# Patient Record
Sex: Female | Born: 1955 | Race: Black or African American | Hispanic: No | Marital: Single | Smoking: Former smoker
Health system: Southern US, Community
[De-identification: ages and names within clinical notes are randomized; demographics above are authoritative.]

## PROBLEM LIST (undated history)

## (undated) DIAGNOSIS — M2040 Other hammer toe(s) (acquired), unspecified foot: Secondary | ICD-10-CM

## (undated) DIAGNOSIS — Z8249 Family history of ischemic heart disease and other diseases of the circulatory system: Secondary | ICD-10-CM

## (undated) DIAGNOSIS — I10 Essential (primary) hypertension: Secondary | ICD-10-CM

## (undated) DIAGNOSIS — W57XXXA Bitten or stung by nonvenomous insect and other nonvenomous arthropods, initial encounter: Secondary | ICD-10-CM

## (undated) DIAGNOSIS — W19XXXA Unspecified fall, initial encounter: Secondary | ICD-10-CM

## (undated) DIAGNOSIS — I2510 Atherosclerotic heart disease of native coronary artery without angina pectoris: Secondary | ICD-10-CM

## (undated) DIAGNOSIS — R718 Other abnormality of red blood cells: Secondary | ICD-10-CM

## (undated) DIAGNOSIS — M75102 Unspecified rotator cuff tear or rupture of left shoulder, not specified as traumatic: Secondary | ICD-10-CM

## (undated) DIAGNOSIS — M7520 Bicipital tendinitis, unspecified shoulder: Secondary | ICD-10-CM

## (undated) DIAGNOSIS — I639 Cerebral infarction, unspecified: Secondary | ICD-10-CM

## (undated) DIAGNOSIS — S90569A Insect bite (nonvenomous), unspecified ankle, initial encounter: Secondary | ICD-10-CM

## (undated) DIAGNOSIS — E785 Hyperlipidemia, unspecified: Secondary | ICD-10-CM

## (undated) DIAGNOSIS — M109 Gout, unspecified: Secondary | ICD-10-CM

## (undated) HISTORY — DX: Family history of ischemic heart disease and other diseases of the circulatory system: Z82.49

## (undated) HISTORY — PX: CORONARY ARTERY BYPASS GRAFT: SHX141

## (undated) HISTORY — DX: Essential (primary) hypertension: I10

## (undated) HISTORY — DX: Insect bite (nonvenomous), unspecified ankle, initial encounter: S90.569A

## (undated) HISTORY — DX: Bicipital tendinitis, unspecified shoulder: M75.20

## (undated) HISTORY — DX: Cerebral infarction, unspecified: I63.9

## (undated) HISTORY — PX: CARDIAC CATHETERIZATION: SHX172

## (undated) HISTORY — DX: Hyperlipidemia, unspecified: E78.5

## (undated) HISTORY — DX: Atherosclerotic heart disease of native coronary artery without angina pectoris: I25.10

## (undated) HISTORY — DX: Other abnormality of red blood cells: R71.8

## (undated) HISTORY — PX: TUBAL LIGATION: SHX77

## (undated) HISTORY — DX: Unspecified rotator cuff tear or rupture of left shoulder, not specified as traumatic: M75.102

## (undated) HISTORY — DX: Insect bite (nonvenomous), unspecified ankle, initial encounter: W57.XXXA

## (undated) HISTORY — DX: Other hammer toe(s) (acquired), unspecified foot: M20.40

---

## 2001-11-13 ENCOUNTER — Encounter: Payer: Self-pay | Admitting: Family Medicine

## 2001-11-13 ENCOUNTER — Ambulatory Visit (HOSPITAL_COMMUNITY): Admission: RE | Admit: 2001-11-13 | Discharge: 2001-11-13 | Payer: Self-pay | Admitting: Family Medicine

## 2002-11-18 ENCOUNTER — Encounter: Payer: Self-pay | Admitting: Family Medicine

## 2002-11-18 ENCOUNTER — Ambulatory Visit (HOSPITAL_COMMUNITY): Admission: RE | Admit: 2002-11-18 | Discharge: 2002-11-18 | Payer: Self-pay | Admitting: Family Medicine

## 2003-02-02 ENCOUNTER — Ambulatory Visit (HOSPITAL_COMMUNITY): Admission: RE | Admit: 2003-02-02 | Discharge: 2003-02-02 | Payer: Self-pay | Admitting: Family Medicine

## 2003-02-02 ENCOUNTER — Encounter: Payer: Self-pay | Admitting: Family Medicine

## 2004-09-19 ENCOUNTER — Emergency Department (HOSPITAL_COMMUNITY): Admission: EM | Admit: 2004-09-19 | Discharge: 2004-09-19 | Payer: Self-pay | Admitting: Family Medicine

## 2005-12-15 ENCOUNTER — Emergency Department (HOSPITAL_COMMUNITY): Admission: EM | Admit: 2005-12-15 | Discharge: 2005-12-15 | Payer: Self-pay | Admitting: Family Medicine

## 2005-12-19 ENCOUNTER — Emergency Department (HOSPITAL_COMMUNITY): Admission: EM | Admit: 2005-12-19 | Discharge: 2005-12-19 | Payer: Self-pay | Admitting: Family Medicine

## 2006-08-14 HISTORY — PX: CORONARY ARTERY BYPASS GRAFT: SHX141

## 2007-03-17 ENCOUNTER — Emergency Department (HOSPITAL_COMMUNITY): Admission: EM | Admit: 2007-03-17 | Discharge: 2007-03-17 | Payer: Self-pay | Admitting: Emergency Medicine

## 2007-03-18 ENCOUNTER — Emergency Department (HOSPITAL_COMMUNITY): Admission: EM | Admit: 2007-03-18 | Discharge: 2007-03-18 | Payer: Self-pay | Admitting: Emergency Medicine

## 2007-04-23 DIAGNOSIS — R718 Other abnormality of red blood cells: Secondary | ICD-10-CM | POA: Insufficient documentation

## 2007-04-23 DIAGNOSIS — I10 Essential (primary) hypertension: Secondary | ICD-10-CM

## 2007-04-24 ENCOUNTER — Ambulatory Visit: Payer: Self-pay | Admitting: Family Medicine

## 2007-04-24 ENCOUNTER — Ambulatory Visit: Payer: Self-pay | Admitting: *Deleted

## 2007-04-24 DIAGNOSIS — M7520 Bicipital tendinitis, unspecified shoulder: Secondary | ICD-10-CM | POA: Insufficient documentation

## 2007-04-24 LAB — CONVERTED CEMR LAB
ALT: 39 units/L — ABNORMAL HIGH (ref 0–35)
AST: 25 units/L (ref 0–37)
Albumin: 4.6 g/dL (ref 3.5–5.2)
Alkaline Phosphatase: 79 units/L (ref 39–117)
BUN: 11 mg/dL (ref 6–23)
Basophils Absolute: 0 10*3/uL (ref 0.0–0.1)
Basophils Relative: 0 % (ref 0–1)
Calcium: 9.9 mg/dL (ref 8.4–10.5)
Chloride: 105 meq/L (ref 96–112)
Creatinine, Ser: 1.06 mg/dL (ref 0.40–1.20)
Eosinophils Absolute: 0.1 10*3/uL (ref 0.0–0.7)
HDL: 59 mg/dL (ref >39)
LDL Cholesterol: 107 mg/dL — ABNORMAL HIGH (ref 0–99)
MCHC: 31.9 g/dL (ref 30.0–36.0)
MCV: 71.5 fL — ABNORMAL LOW (ref 78.0–100.0)
Neutro Abs: 2.9 10*3/uL (ref 1.7–7.7)
Neutrophils Relative %: 41 % — ABNORMAL LOW (ref 43–77)
Potassium: 5 meq/L (ref 3.5–5.3)
RDW: 15.7 % — ABNORMAL HIGH (ref 11.5–14.0)
TSH: 0.482 microintl units/mL (ref 0.350–5.50)
Total CHOL/HDL Ratio: 3.2

## 2007-04-25 ENCOUNTER — Ambulatory Visit (HOSPITAL_COMMUNITY): Admission: RE | Admit: 2007-04-25 | Discharge: 2007-04-25 | Payer: Self-pay | Admitting: Family Medicine

## 2007-04-25 ENCOUNTER — Encounter (INDEPENDENT_AMBULATORY_CARE_PROVIDER_SITE_OTHER): Payer: Self-pay | Admitting: Family Medicine

## 2007-05-15 DIAGNOSIS — I2510 Atherosclerotic heart disease of native coronary artery without angina pectoris: Secondary | ICD-10-CM

## 2007-05-15 HISTORY — DX: Atherosclerotic heart disease of native coronary artery without angina pectoris: I25.10

## 2007-05-29 ENCOUNTER — Inpatient Hospital Stay (HOSPITAL_COMMUNITY): Admission: EM | Admit: 2007-05-29 | Discharge: 2007-06-10 | Payer: Self-pay | Admitting: Emergency Medicine

## 2007-05-29 ENCOUNTER — Ambulatory Visit: Payer: Self-pay | Admitting: Cardiology

## 2007-05-30 ENCOUNTER — Encounter (INDEPENDENT_AMBULATORY_CARE_PROVIDER_SITE_OTHER): Payer: Self-pay | Admitting: Family Medicine

## 2007-06-02 ENCOUNTER — Ambulatory Visit: Payer: Self-pay | Admitting: Thoracic Surgery (Cardiothoracic Vascular Surgery)

## 2007-06-02 ENCOUNTER — Encounter (INDEPENDENT_AMBULATORY_CARE_PROVIDER_SITE_OTHER): Payer: Self-pay | Admitting: Family Medicine

## 2007-06-25 ENCOUNTER — Ambulatory Visit: Payer: Self-pay | Admitting: Internal Medicine

## 2007-06-28 ENCOUNTER — Encounter: Admission: RE | Admit: 2007-06-28 | Discharge: 2007-06-28 | Payer: Self-pay | Admitting: Cardiothoracic Surgery

## 2007-06-28 ENCOUNTER — Ambulatory Visit: Payer: Self-pay | Admitting: Cardiothoracic Surgery

## 2007-07-04 ENCOUNTER — Encounter (INDEPENDENT_AMBULATORY_CARE_PROVIDER_SITE_OTHER): Payer: Self-pay | Admitting: Family Medicine

## 2007-07-04 ENCOUNTER — Ambulatory Visit: Payer: Self-pay | Admitting: Vascular Surgery

## 2007-07-04 ENCOUNTER — Ambulatory Visit (HOSPITAL_COMMUNITY): Admission: RE | Admit: 2007-07-04 | Discharge: 2007-07-04 | Payer: Self-pay | Admitting: Cardiothoracic Surgery

## 2007-07-04 ENCOUNTER — Encounter: Payer: Self-pay | Admitting: Cardiothoracic Surgery

## 2007-07-05 ENCOUNTER — Ambulatory Visit: Payer: Self-pay | Admitting: Cardiothoracic Surgery

## 2007-07-19 ENCOUNTER — Ambulatory Visit: Payer: Self-pay | Admitting: Cardiothoracic Surgery

## 2007-08-13 ENCOUNTER — Encounter (INDEPENDENT_AMBULATORY_CARE_PROVIDER_SITE_OTHER): Payer: Self-pay | Admitting: Family Medicine

## 2007-09-05 ENCOUNTER — Ambulatory Visit: Payer: Self-pay | Admitting: Cardiothoracic Surgery

## 2007-09-16 ENCOUNTER — Encounter (INDEPENDENT_AMBULATORY_CARE_PROVIDER_SITE_OTHER): Payer: Self-pay | Admitting: Family Medicine

## 2007-09-16 ENCOUNTER — Ambulatory Visit: Payer: Self-pay | Admitting: Cardiology

## 2007-09-19 ENCOUNTER — Encounter (HOSPITAL_COMMUNITY): Admission: RE | Admit: 2007-09-19 | Discharge: 2007-12-18 | Payer: Self-pay | Admitting: Cardiology

## 2007-09-25 ENCOUNTER — Ambulatory Visit: Payer: Self-pay | Admitting: Cardiology

## 2007-10-10 ENCOUNTER — Ambulatory Visit: Payer: Self-pay | Admitting: Cardiology

## 2007-10-10 LAB — CONVERTED CEMR LAB
BUN: 12 mg/dL (ref 6–23)
Creatinine, Ser: 1.1 mg/dL (ref 0.4–1.2)
GFR calc non Af Amer: 56 mL/min
Potassium: 3.8 meq/L (ref 3.5–5.1)
Sodium: 141 meq/L (ref 135–145)

## 2007-11-05 ENCOUNTER — Ambulatory Visit: Payer: Self-pay | Admitting: Family Medicine

## 2007-11-05 DIAGNOSIS — K089 Disorder of teeth and supporting structures, unspecified: Secondary | ICD-10-CM | POA: Insufficient documentation

## 2007-11-05 DIAGNOSIS — E785 Hyperlipidemia, unspecified: Secondary | ICD-10-CM | POA: Insufficient documentation

## 2007-11-28 ENCOUNTER — Ambulatory Visit: Payer: Self-pay | Admitting: Cardiology

## 2007-12-05 ENCOUNTER — Encounter (INDEPENDENT_AMBULATORY_CARE_PROVIDER_SITE_OTHER): Payer: Self-pay | Admitting: Neurology

## 2007-12-05 ENCOUNTER — Encounter (INDEPENDENT_AMBULATORY_CARE_PROVIDER_SITE_OTHER): Payer: Self-pay | Admitting: Family Medicine

## 2007-12-05 ENCOUNTER — Inpatient Hospital Stay (HOSPITAL_COMMUNITY): Admission: EM | Admit: 2007-12-05 | Discharge: 2007-12-08 | Payer: Self-pay | Admitting: Emergency Medicine

## 2007-12-05 ENCOUNTER — Ambulatory Visit: Payer: Self-pay | Admitting: Cardiology

## 2007-12-05 DIAGNOSIS — I6350 Cerebral infarction due to unspecified occlusion or stenosis of unspecified cerebral artery: Secondary | ICD-10-CM

## 2007-12-05 HISTORY — DX: Cerebral infarction due to unspecified occlusion or stenosis of unspecified cerebral artery: I63.50

## 2007-12-06 ENCOUNTER — Encounter (INDEPENDENT_AMBULATORY_CARE_PROVIDER_SITE_OTHER): Payer: Self-pay | Admitting: Neurology

## 2008-01-27 ENCOUNTER — Encounter (INDEPENDENT_AMBULATORY_CARE_PROVIDER_SITE_OTHER): Payer: Self-pay | Admitting: Family Medicine

## 2008-01-28 ENCOUNTER — Encounter (INDEPENDENT_AMBULATORY_CARE_PROVIDER_SITE_OTHER): Payer: Self-pay | Admitting: Family Medicine

## 2008-02-07 ENCOUNTER — Ambulatory Visit: Payer: Self-pay | Admitting: Cardiology

## 2008-02-07 LAB — CONVERTED CEMR LAB
ALT: 75 units/L — ABNORMAL HIGH (ref 0–35)
AST: 61 units/L — ABNORMAL HIGH (ref 0–37)
Albumin: 3.8 g/dL (ref 3.5–5.2)
Alkaline Phosphatase: 65 units/L (ref 39–117)
Bilirubin, Direct: 0.1 mg/dL (ref 0.0–0.3)
Cholesterol: 116 mg/dL (ref 0–200)
Total Protein: 6.5 g/dL (ref 6.0–8.3)
Triglycerides: 45 mg/dL (ref 0–149)

## 2008-02-11 ENCOUNTER — Ambulatory Visit: Payer: Self-pay | Admitting: Family Medicine

## 2008-02-11 DIAGNOSIS — W57XXXA Bitten or stung by nonvenomous insect and other nonvenomous arthropods, initial encounter: Secondary | ICD-10-CM | POA: Insufficient documentation

## 2008-02-11 DIAGNOSIS — M2040 Other hammer toe(s) (acquired), unspecified foot: Secondary | ICD-10-CM

## 2008-02-11 DIAGNOSIS — S90569A Insect bite (nonvenomous), unspecified ankle, initial encounter: Secondary | ICD-10-CM

## 2008-02-12 ENCOUNTER — Telehealth (INDEPENDENT_AMBULATORY_CARE_PROVIDER_SITE_OTHER): Payer: Self-pay | Admitting: *Deleted

## 2008-02-19 ENCOUNTER — Ambulatory Visit: Payer: Self-pay | Admitting: Family Medicine

## 2008-02-28 ENCOUNTER — Telehealth (INDEPENDENT_AMBULATORY_CARE_PROVIDER_SITE_OTHER): Payer: Self-pay | Admitting: *Deleted

## 2008-03-10 ENCOUNTER — Encounter (INDEPENDENT_AMBULATORY_CARE_PROVIDER_SITE_OTHER): Payer: Self-pay | Admitting: Family Medicine

## 2008-03-26 ENCOUNTER — Telehealth (INDEPENDENT_AMBULATORY_CARE_PROVIDER_SITE_OTHER): Payer: Self-pay | Admitting: Family Medicine

## 2008-04-10 ENCOUNTER — Emergency Department (HOSPITAL_COMMUNITY): Admission: EM | Admit: 2008-04-10 | Discharge: 2008-04-10 | Payer: Self-pay | Admitting: Family Medicine

## 2008-04-28 ENCOUNTER — Ambulatory Visit (HOSPITAL_COMMUNITY): Admission: RE | Admit: 2008-04-28 | Discharge: 2008-04-28 | Payer: Self-pay | Admitting: Family Medicine

## 2008-05-29 ENCOUNTER — Ambulatory Visit: Payer: Self-pay | Admitting: Cardiology

## 2008-09-07 ENCOUNTER — Encounter (INDEPENDENT_AMBULATORY_CARE_PROVIDER_SITE_OTHER): Payer: Self-pay | Admitting: Family Medicine

## 2008-10-25 IMAGING — CR DG ABDOMEN ACUTE W/ 1V CHEST
3 series · 3 of 3 positions shown · non-contrast
Comparison: none

HISTORY: The dural female with low abdominal pain x4 days frequent urination,
and vomiting or diarrhea.

Exam acute abdominal series with chest x-ray:
Chest x-ray findings:
The cardiac silhouette is normal. There is prominence of the ascending aorta and
ectatic descending aorta. Costophrenic angles are clear. No free air beneath the
hemidiaphragms. Normal pulmonary vasculature.

[view not recorded (1 of 3)]
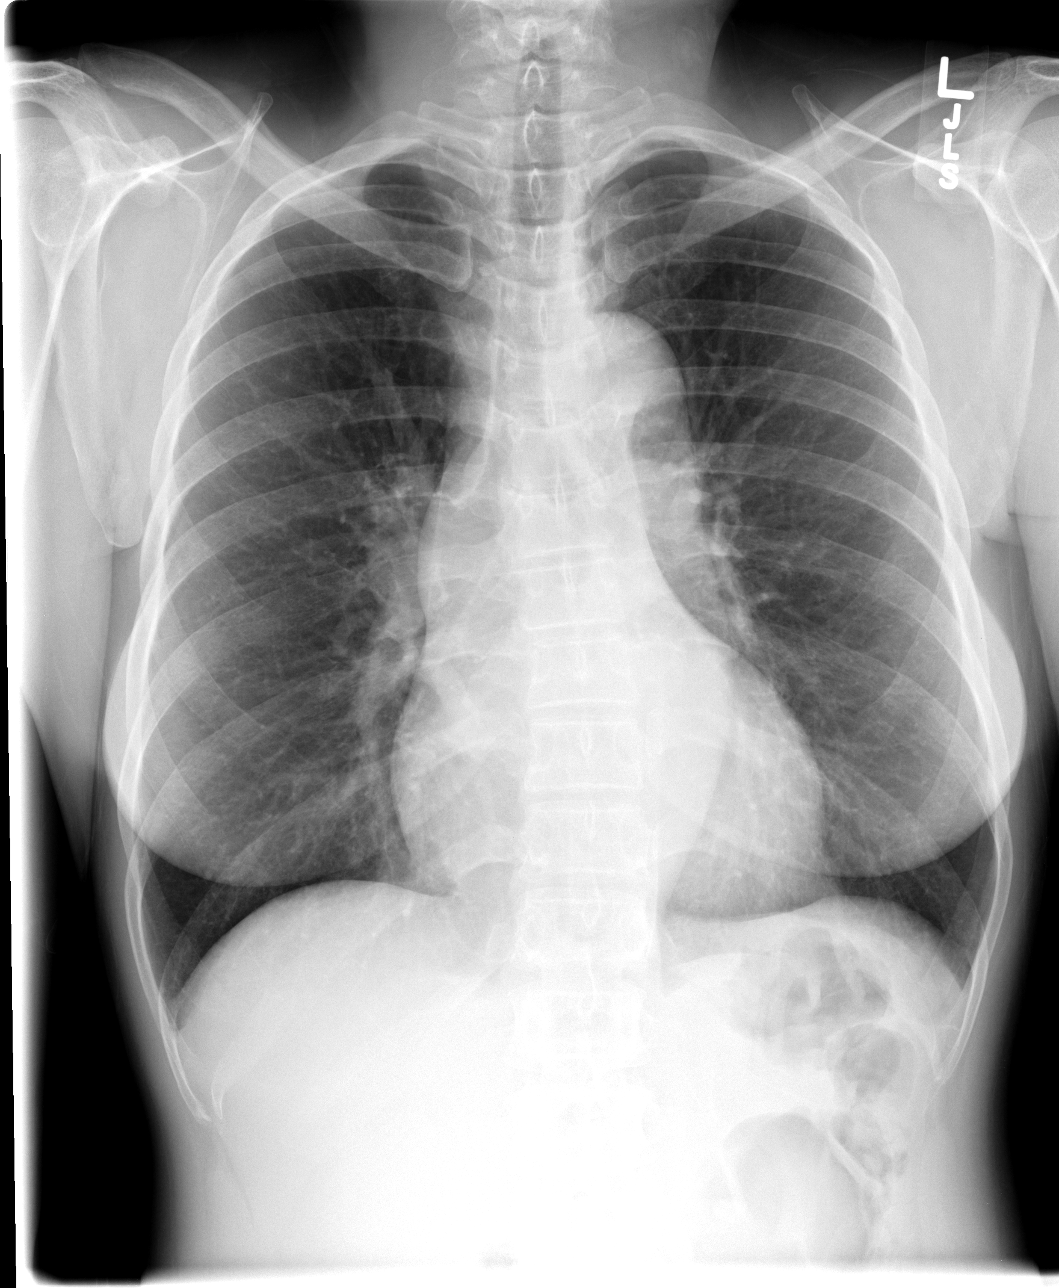

[view not recorded (2 of 3)]
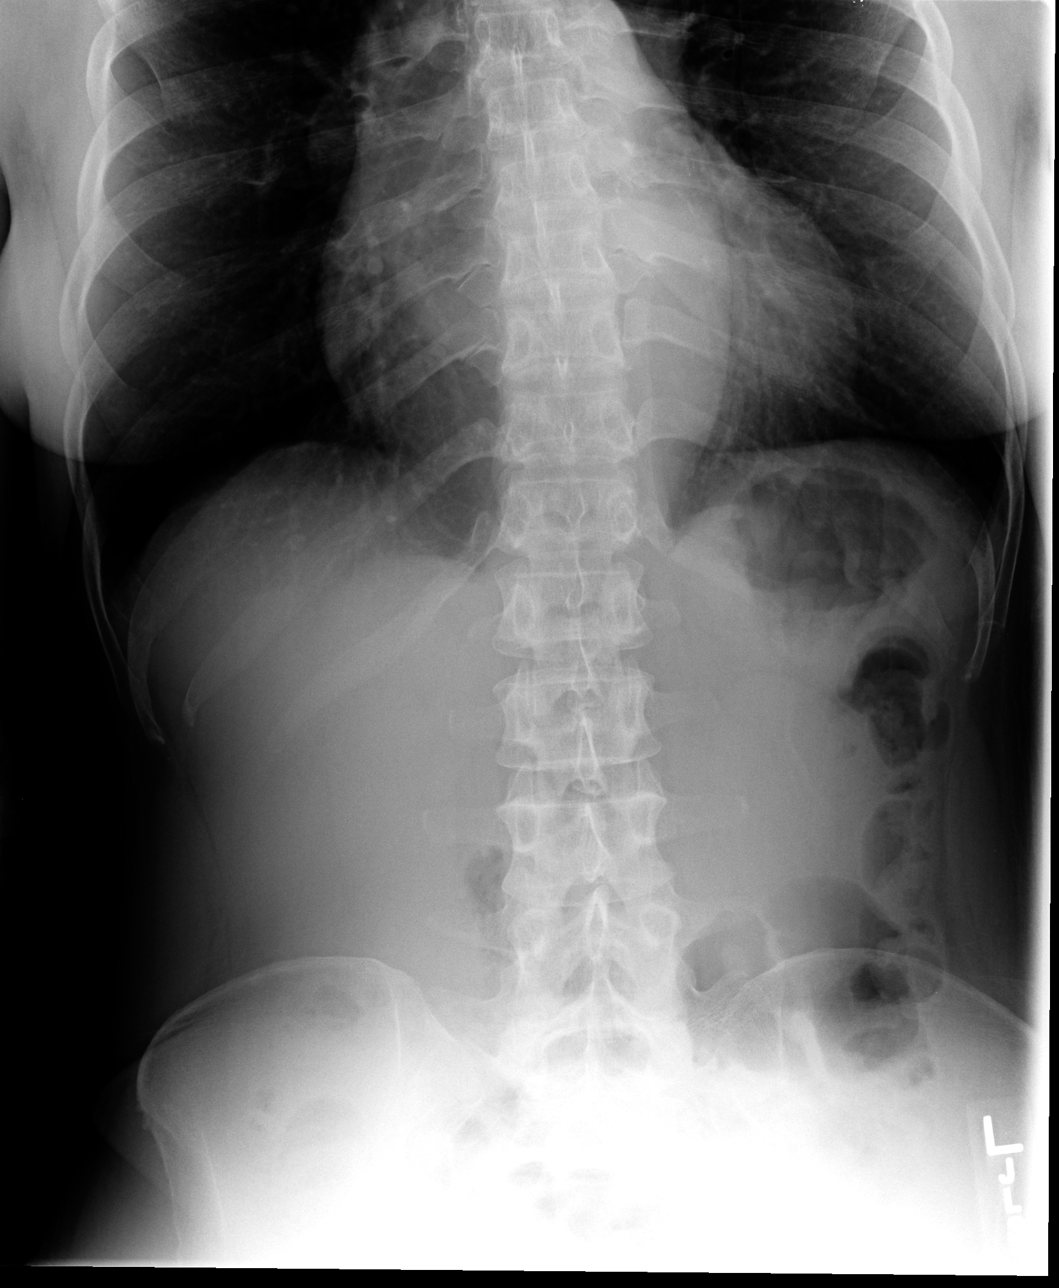

[view not recorded (3 of 3)]
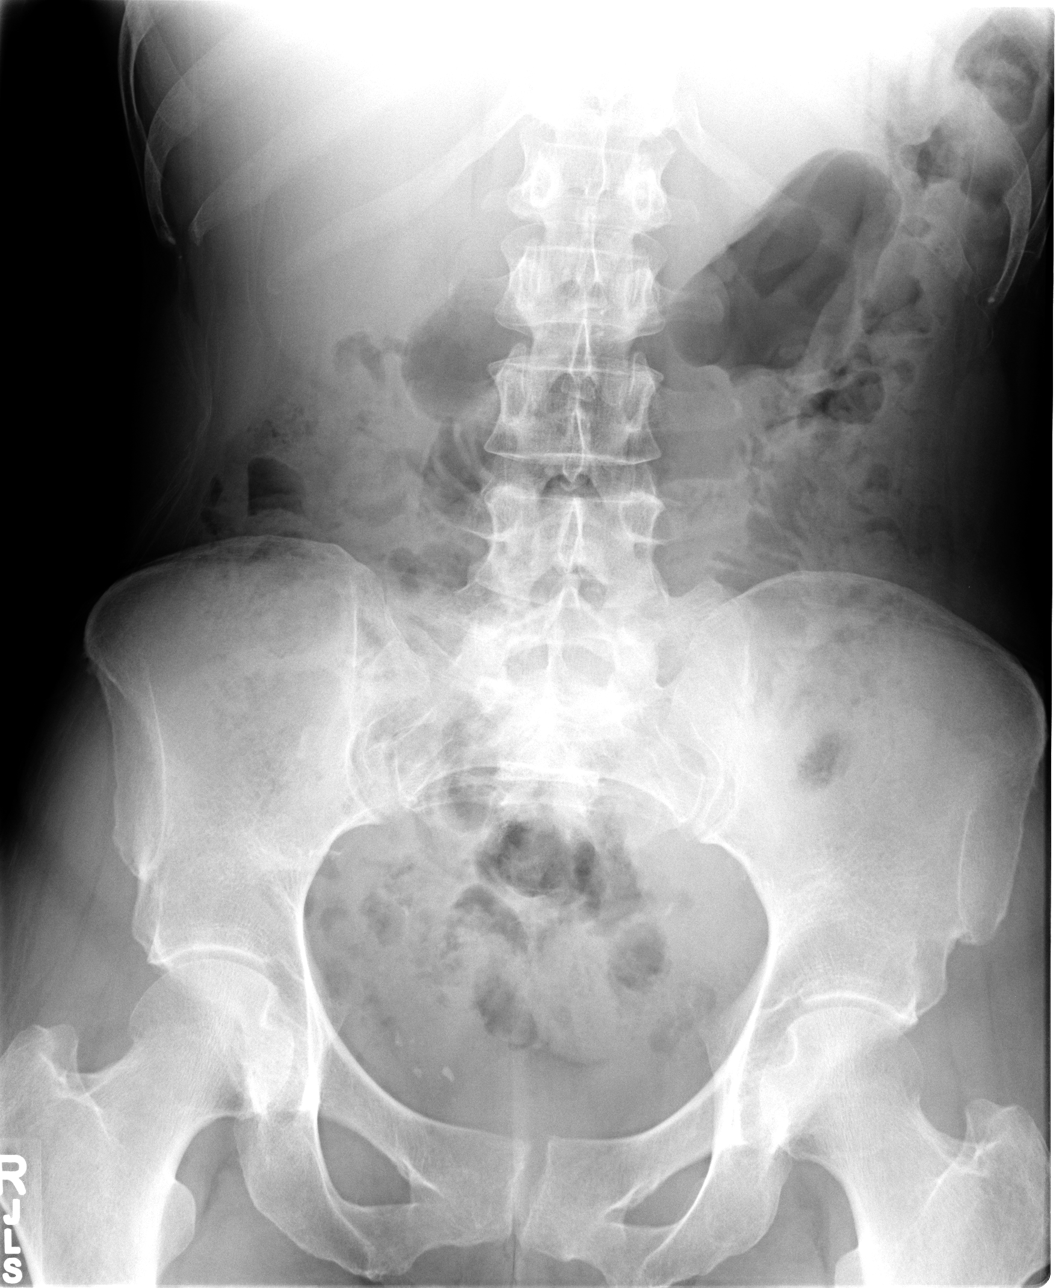

[3 of 3 positions shown; findings below may reference images not displayed]

IMPRESSION: 1. No acute cardiopulmonary process.
2. No free air beneath the hemidiaphragms.
3. Prominent ascending aorta may represents ectasia versus aortic stenosis,
correlate clinically.

Abdomen 3 views:
There are several mildly dilated loops (3.5 cm) of apparent small bowel within
the midlower abdomen. There is gas within the transverse, descending, and
rectosigmoid colon. No pathologic calcifications are evident.
IMPRESSION: 1. Several mildly dilated loops of apparent small bowel in the midlower abdomen
is nonspecific finding may represent focal ileus. Cannot rule out partial
obstruction.
2. No evidence of high-grade obstruction as gas is evident within the distal
colon.

## 2008-10-26 IMAGING — CR DG ABDOMEN ACUTE W/ 1V CHEST
3 series · 3 of 3 positions shown · non-contrast
Comparison: 03/17/07.

CLINICAL DATA: Abdominal pain.  Nausea.  
 ACUTE ABDOMINAL SERIES:

[w chest pa]
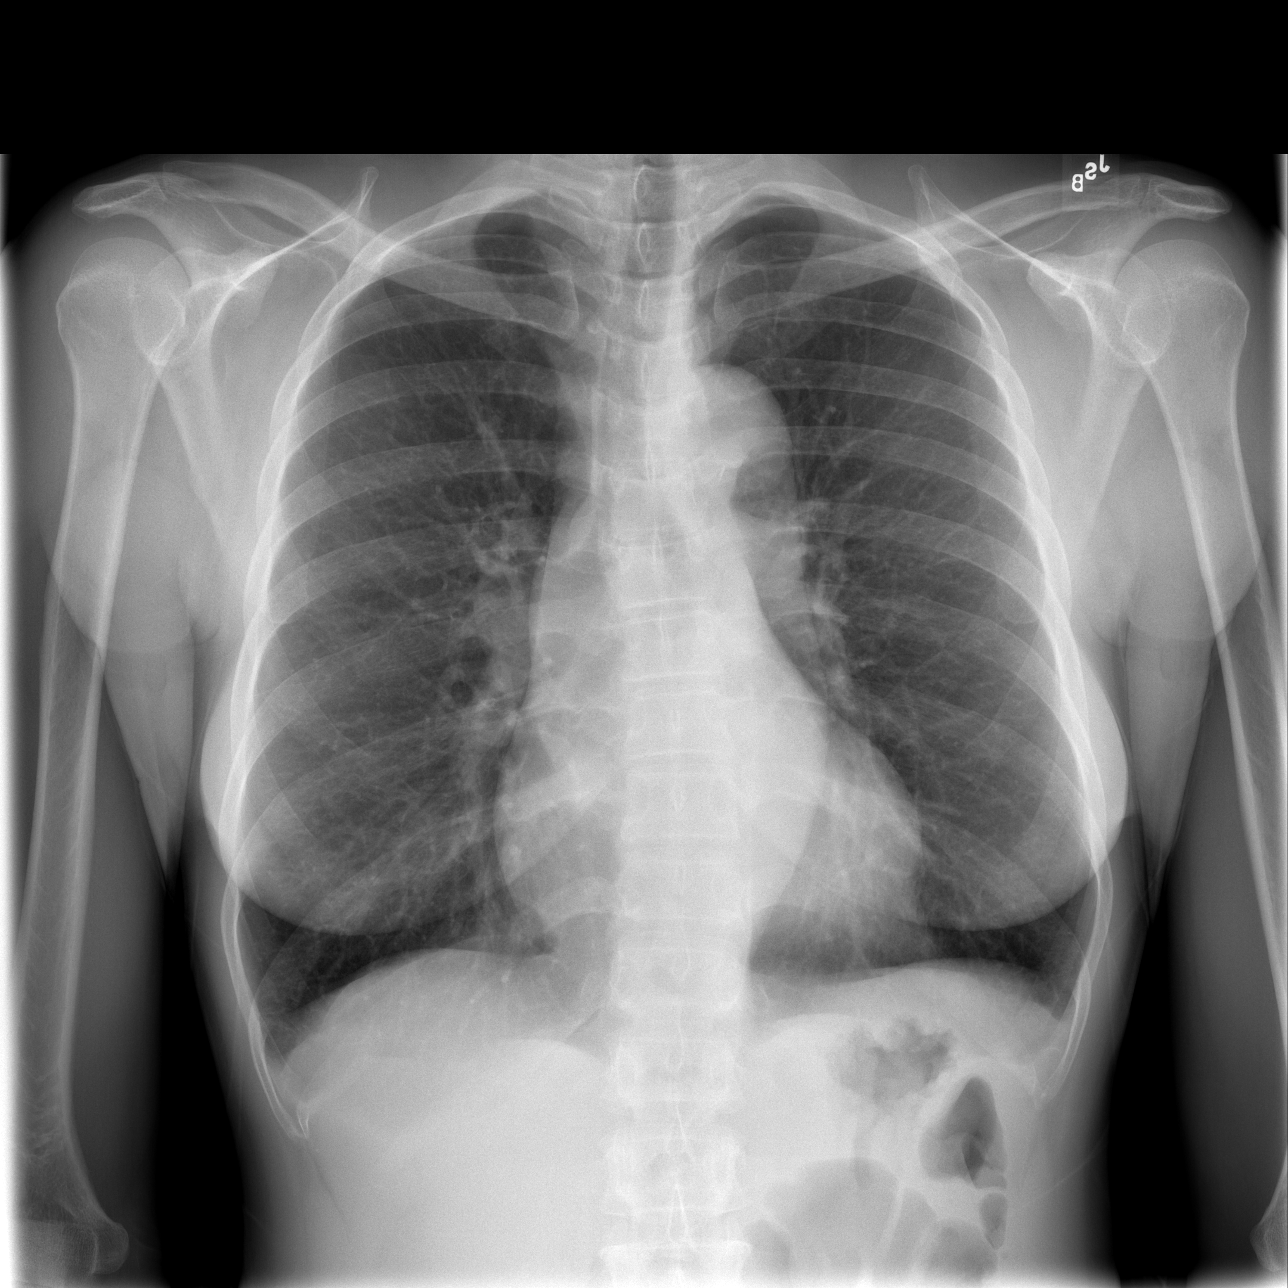

[w abdomen upright]
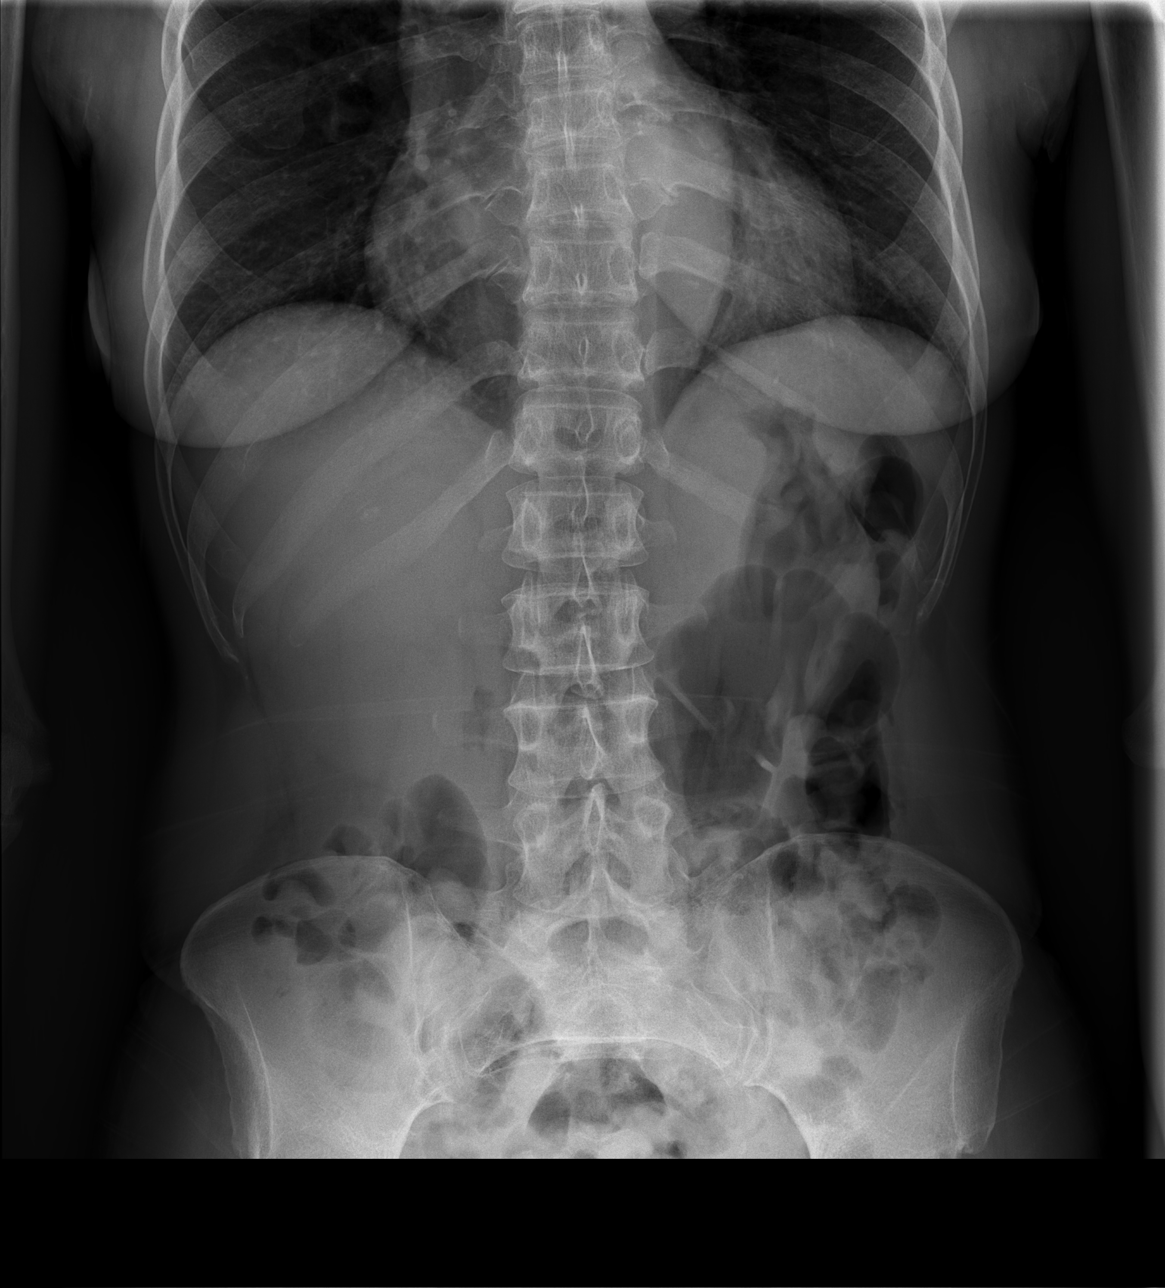

[t abdomen supine]
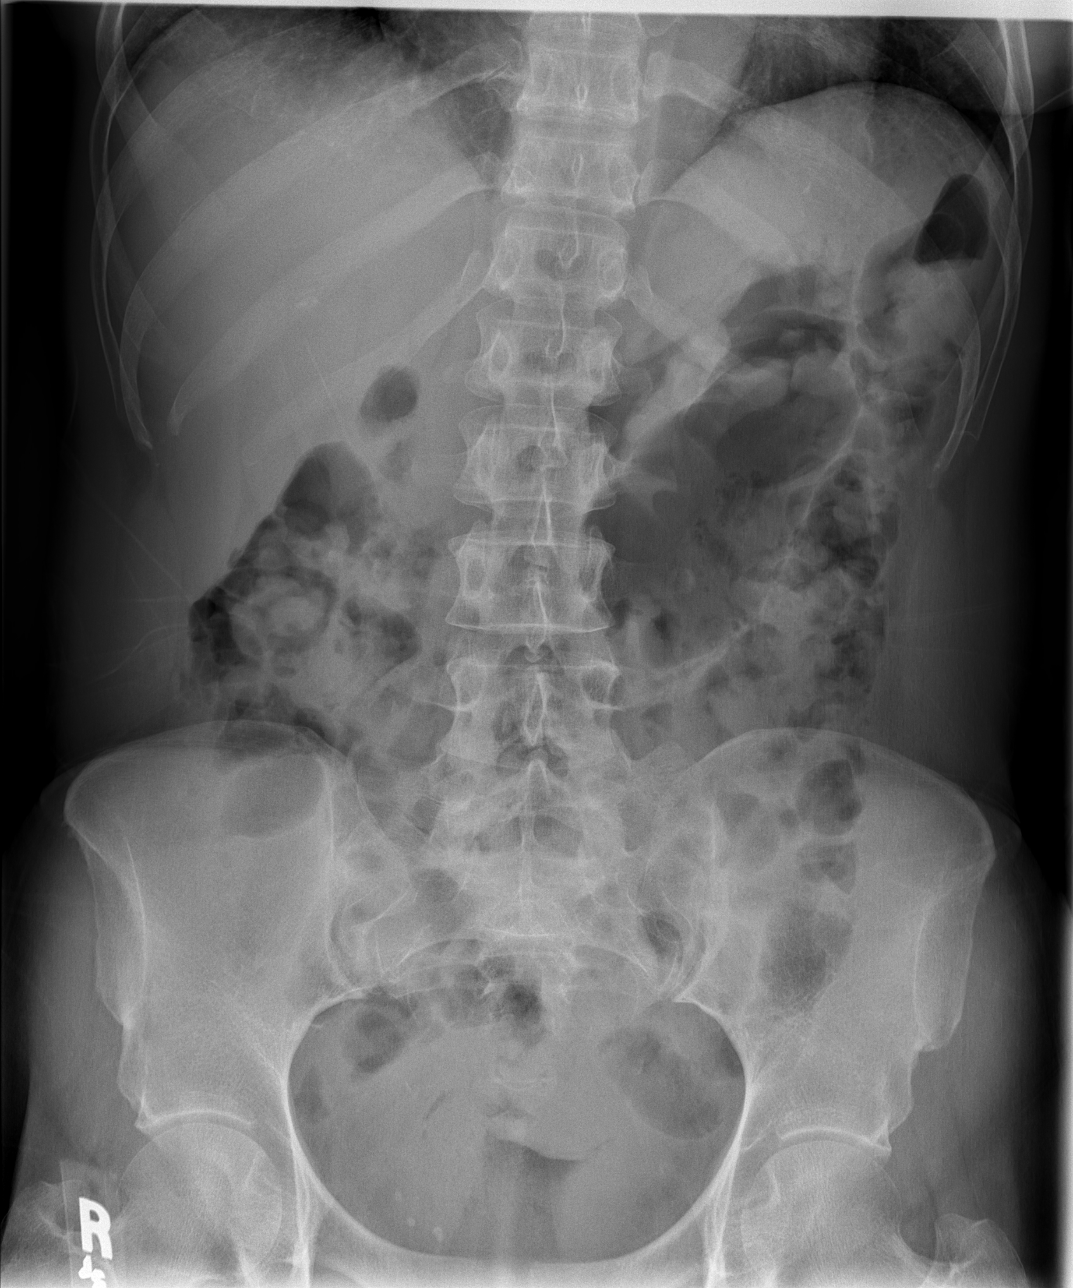

[3 of 3 positions shown; findings below may reference images not displayed]

FINDINGS: Abdomen ? 2 Views:  Gas pattern is unremarkable without evidence of ileus, obstruction, or free air.  No worrisome calcifications or bony findings. 
 Chest ? 1 View:  Heart size is normal.  The thoracic aorta is unfolded.  The lungs are clear.  No free air under the diaphragm.
IMPRESSION: Negative acute abdominal series.

## 2008-11-03 ENCOUNTER — Ambulatory Visit: Payer: Self-pay | Admitting: Family Medicine

## 2008-11-03 DIAGNOSIS — M25512 Pain in left shoulder: Secondary | ICD-10-CM

## 2008-11-04 ENCOUNTER — Ambulatory Visit (HOSPITAL_COMMUNITY): Admission: RE | Admit: 2008-11-04 | Discharge: 2008-11-04 | Payer: Self-pay | Admitting: Family Medicine

## 2008-11-04 ENCOUNTER — Telehealth (INDEPENDENT_AMBULATORY_CARE_PROVIDER_SITE_OTHER): Payer: Self-pay | Admitting: Family Medicine

## 2008-11-24 ENCOUNTER — Ambulatory Visit: Payer: Self-pay | Admitting: Family Medicine

## 2008-11-25 ENCOUNTER — Encounter (INDEPENDENT_AMBULATORY_CARE_PROVIDER_SITE_OTHER): Payer: Self-pay | Admitting: Family Medicine

## 2008-11-25 LAB — CONVERTED CEMR LAB
AST: 31 units/L (ref 0–37)
Alkaline Phosphatase: 75 units/L (ref 39–117)
Basophils Absolute: 0 10*3/uL (ref 0.0–0.1)
Basophils Relative: 0 % (ref 0–1)
Glucose, Bld: 87 mg/dL (ref 70–99)
HDL: 51 mg/dL (ref >39)
LDL Cholesterol: 59 mg/dL (ref 0–99)
Lymphocytes Relative: 43 % (ref 12–46)
MCHC: 32.5 g/dL (ref 30.0–36.0)
Monocytes Absolute: 0.5 10*3/uL (ref 0.1–1.0)
Neutro Abs: 3.5 10*3/uL (ref 1.7–7.7)
Neutrophils Relative %: 49 % (ref 43–77)
Platelets: 241 10*3/uL (ref 150–400)
Potassium: 4.2 meq/L (ref 3.5–5.3)
RDW: 16 % — ABNORMAL HIGH (ref 11.5–15.5)
Sodium: 141 meq/L (ref 135–145)
TSH: 1.466 microintl units/mL (ref 0.350–4.500)
Total Bilirubin: 0.3 mg/dL (ref 0.3–1.2)
Total Protein: 6.6 g/dL (ref 6.0–8.3)
Triglycerides: 131 mg/dL (ref <150)
VLDL: 26 mg/dL (ref 0–40)

## 2008-12-23 ENCOUNTER — Telehealth: Payer: Self-pay | Admitting: Cardiology

## 2009-01-04 ENCOUNTER — Ambulatory Visit (HOSPITAL_COMMUNITY): Admission: RE | Admit: 2009-01-04 | Discharge: 2009-01-04 | Payer: Self-pay | Admitting: Family Medicine

## 2009-01-06 ENCOUNTER — Telehealth (INDEPENDENT_AMBULATORY_CARE_PROVIDER_SITE_OTHER): Payer: Self-pay | Admitting: Family Medicine

## 2009-01-22 ENCOUNTER — Encounter (INDEPENDENT_AMBULATORY_CARE_PROVIDER_SITE_OTHER): Payer: Self-pay | Admitting: *Deleted

## 2009-01-27 ENCOUNTER — Telehealth (INDEPENDENT_AMBULATORY_CARE_PROVIDER_SITE_OTHER): Payer: Self-pay | Admitting: Family Medicine

## 2009-01-29 ENCOUNTER — Encounter (INDEPENDENT_AMBULATORY_CARE_PROVIDER_SITE_OTHER): Payer: Self-pay | Admitting: Nurse Practitioner

## 2009-02-04 ENCOUNTER — Encounter (INDEPENDENT_AMBULATORY_CARE_PROVIDER_SITE_OTHER): Payer: Self-pay | Admitting: *Deleted

## 2009-02-12 ENCOUNTER — Ambulatory Visit: Payer: Self-pay | Admitting: Nurse Practitioner

## 2009-02-12 DIAGNOSIS — M719 Bursopathy, unspecified: Secondary | ICD-10-CM

## 2009-02-12 DIAGNOSIS — M67919 Unspecified disorder of synovium and tendon, unspecified shoulder: Secondary | ICD-10-CM | POA: Insufficient documentation

## 2009-02-12 LAB — CONVERTED CEMR LAB: LDL Goal: 100 mg/dL

## 2009-02-23 ENCOUNTER — Emergency Department (HOSPITAL_COMMUNITY): Admission: EM | Admit: 2009-02-23 | Discharge: 2009-02-23 | Payer: Self-pay | Admitting: Emergency Medicine

## 2009-02-23 ENCOUNTER — Emergency Department (HOSPITAL_COMMUNITY): Admission: EM | Admit: 2009-02-23 | Discharge: 2009-02-24 | Payer: Self-pay | Admitting: Emergency Medicine

## 2009-02-26 ENCOUNTER — Encounter (INDEPENDENT_AMBULATORY_CARE_PROVIDER_SITE_OTHER): Payer: Self-pay | Admitting: Nurse Practitioner

## 2009-03-02 ENCOUNTER — Encounter (INDEPENDENT_AMBULATORY_CARE_PROVIDER_SITE_OTHER): Payer: Self-pay | Admitting: Internal Medicine

## 2009-03-04 ENCOUNTER — Encounter (INDEPENDENT_AMBULATORY_CARE_PROVIDER_SITE_OTHER): Payer: Self-pay | Admitting: Nurse Practitioner

## 2009-03-17 ENCOUNTER — Ambulatory Visit: Payer: Self-pay | Admitting: Internal Medicine

## 2009-03-17 ENCOUNTER — Encounter (INDEPENDENT_AMBULATORY_CARE_PROVIDER_SITE_OTHER): Payer: Self-pay | Admitting: Nurse Practitioner

## 2009-04-07 ENCOUNTER — Encounter (INDEPENDENT_AMBULATORY_CARE_PROVIDER_SITE_OTHER): Payer: Self-pay | Admitting: Family Medicine

## 2009-04-07 ENCOUNTER — Encounter (INDEPENDENT_AMBULATORY_CARE_PROVIDER_SITE_OTHER): Payer: Self-pay | Admitting: Nurse Practitioner

## 2009-05-03 DIAGNOSIS — Z87891 Personal history of nicotine dependence: Secondary | ICD-10-CM

## 2009-05-03 DIAGNOSIS — I25810 Atherosclerosis of coronary artery bypass graft(s) without angina pectoris: Secondary | ICD-10-CM

## 2009-05-03 DIAGNOSIS — I25708 Atherosclerosis of coronary artery bypass graft(s), unspecified, with other forms of angina pectoris: Secondary | ICD-10-CM | POA: Insufficient documentation

## 2009-05-11 ENCOUNTER — Ambulatory Visit: Payer: Self-pay | Admitting: Cardiology

## 2009-05-12 ENCOUNTER — Ambulatory Visit (HOSPITAL_COMMUNITY): Admission: RE | Admit: 2009-05-12 | Discharge: 2009-05-12 | Payer: Self-pay | Admitting: Internal Medicine

## 2009-05-12 ENCOUNTER — Telehealth (INDEPENDENT_AMBULATORY_CARE_PROVIDER_SITE_OTHER): Payer: Self-pay | Admitting: Nurse Practitioner

## 2009-05-13 ENCOUNTER — Telehealth: Payer: Self-pay | Admitting: Cardiology

## 2009-05-14 ENCOUNTER — Telehealth: Payer: Self-pay | Admitting: Cardiology

## 2009-05-25 ENCOUNTER — Telehealth (INDEPENDENT_AMBULATORY_CARE_PROVIDER_SITE_OTHER): Payer: Self-pay | Admitting: *Deleted

## 2009-05-27 ENCOUNTER — Telehealth: Payer: Self-pay | Admitting: Cardiology

## 2009-06-04 ENCOUNTER — Telehealth: Payer: Self-pay | Admitting: Cardiology

## 2009-06-14 ENCOUNTER — Telehealth: Payer: Self-pay | Admitting: Cardiology

## 2009-06-28 ENCOUNTER — Telehealth: Payer: Self-pay | Admitting: Cardiology

## 2009-12-02 ENCOUNTER — Telehealth (INDEPENDENT_AMBULATORY_CARE_PROVIDER_SITE_OTHER): Payer: Self-pay | Admitting: *Deleted

## 2009-12-17 ENCOUNTER — Encounter (INDEPENDENT_AMBULATORY_CARE_PROVIDER_SITE_OTHER): Payer: Self-pay | Admitting: Nurse Practitioner

## 2010-03-10 ENCOUNTER — Encounter: Payer: Self-pay | Admitting: Cardiology

## 2010-03-10 ENCOUNTER — Encounter (INDEPENDENT_AMBULATORY_CARE_PROVIDER_SITE_OTHER): Payer: Self-pay | Admitting: *Deleted

## 2010-05-13 ENCOUNTER — Ambulatory Visit (HOSPITAL_COMMUNITY): Admission: RE | Admit: 2010-05-13 | Discharge: 2010-05-13 | Payer: Self-pay | Admitting: Internal Medicine

## 2010-06-02 ENCOUNTER — Telehealth (INDEPENDENT_AMBULATORY_CARE_PROVIDER_SITE_OTHER): Payer: Self-pay | Admitting: *Deleted

## 2010-06-17 ENCOUNTER — Ambulatory Visit: Payer: Self-pay | Admitting: Cardiology

## 2010-06-29 ENCOUNTER — Telehealth (INDEPENDENT_AMBULATORY_CARE_PROVIDER_SITE_OTHER): Payer: Self-pay | Admitting: *Deleted

## 2010-06-30 ENCOUNTER — Ambulatory Visit: Payer: Self-pay

## 2010-06-30 ENCOUNTER — Encounter: Payer: Self-pay | Admitting: Cardiology

## 2010-06-30 ENCOUNTER — Encounter (HOSPITAL_COMMUNITY)
Admission: RE | Admit: 2010-06-30 | Discharge: 2010-08-13 | Payer: Self-pay | Source: Home / Self Care | Attending: Cardiology | Admitting: Cardiology

## 2010-06-30 ENCOUNTER — Ambulatory Visit: Payer: Self-pay | Admitting: Cardiology

## 2010-08-24 ENCOUNTER — Emergency Department (HOSPITAL_COMMUNITY)
Admission: EM | Admit: 2010-08-24 | Discharge: 2010-08-25 | Payer: Self-pay | Source: Home / Self Care | Admitting: Emergency Medicine

## 2010-08-24 ENCOUNTER — Telehealth (INDEPENDENT_AMBULATORY_CARE_PROVIDER_SITE_OTHER): Payer: Self-pay | Admitting: Nurse Practitioner

## 2010-09-04 ENCOUNTER — Encounter: Payer: Self-pay | Admitting: Family Medicine

## 2010-09-05 ENCOUNTER — Telehealth (INDEPENDENT_AMBULATORY_CARE_PROVIDER_SITE_OTHER): Payer: Self-pay | Admitting: Nurse Practitioner

## 2010-09-13 NOTE — Assessment & Plan Note (Signed)
Summary: [id number]   Primary Provider:  Dr. Delrae Alfred or Dr. Lorin Picket at [facility]   History of Present Illness: Ms Stradford comes in today for evaluation and management for coronary artery disease, history of bypass surgery, and abnormal EKG.  She has hypertension, hyperlipidemia which is followed at [facility].  He is having no angina or ischemic symptoms. She presented with dyspnea on exertion. She still has some of this. She quit smoking at the time of her bypass. She walks twice a day. She seems to be very compliant with her medications.  \ Current Medications (verified): 1)  Aspirin 81 Mg Tbec (Aspirin) .... Take One Tablet By Mouth Daily 2)  Simvastatin 20 Mg  Tabs (Simvastatin) .... Take One Tablet Every Evening 3)  Calan Sr 240 Mg  Tbcr (Verapamil Hcl) .... Take 1 Tablet By Mouth Every Morning 4)  Toprol Xl 100 Mg  Tb24 (Metoprolol Succinate) .... Take 1 and 1/2  Tablets  By Mouth Every Morning 5)  Accupril 20 Mg  Tabs (Quinapril Hcl) .... Take 1 Tablet By Mouth Every 12 Hours 6)  Plavix 75 Mg Tabs (Clopidogrel Bisulfate) .... Take 1 Tab By Mouth Daily 7)  Tylenol With Codeine #3 300-30 Mg Tabs (Acetaminophen-Codeine) .... Take 1-2 Tablets Every 6-8 Hours As Needed For Shoulder Pain 8)  Naprosyn 500 Mg Tabs (Naproxen) .... Take 1 Tablet By Mouth Every 12 Hours For Shoulder Pain. Take With Food. 9)  Vicodin 5-500 Mg Tabs (Hydrocodone-Acetaminophen) .... Take 1 Tablet Every 6-8 Hours As Needed For Shoulder Pain 10)  Ibuprofen 800 Mg Tabs (Ibuprofen) .... As Needed 11)  Multivitamins   Tabs (Multiple Vitamin) .... 1/2 Tab Two Times A Day  Allergies (verified): No Known Drug Allergies  Past History:  Past Medical History: Last updated: 05/03/2009 [facility]; s/p CABG x4 10/08 (Dr.Wall/Dr.Van Trigt). 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.[name] KitchenMarland KitchenDr.Sethi(#[phone]) HYPERLIPIDEMIA (ICD-272.4) HYPERTENSION (ICD-401.9) TOBACCO USE, QUIT (ICD-V15.82) ROTATOR CUFF SYNDROME, LEFT (ICD-726.10) SHOULDER PAIN, LEFT (ICD-719.41) INSECT BITE, LEG (ICD-916.4) HAMMER TOE, ACQUIRED (ICD-735.4) SCREENING MAMMOGRAPHY, NOT HIGH RISK (ICD-V76.12) UNSPECIFIED DISORDER TEETH&SUPPORTING STRUCTURES (ICD-525.9) PREVENTIVE HEALTH CARE (ICD-V70.0) BICEPS TENDINITIS, RIGHT (ICD-726.12) MICROCYTOSIS (ICD-790.09)  Past Surgical History: Last updated: 11/05/2007 Tubal ligation (1980) Coronary artery bypass graft (06/08/2007)  Family History: Last updated: 05/03/2009 Mother L.HTN Father D.DM/MI age64 Sister L.anxiety Sister L."on steroids" ...sarcoidosis Family History of Coronary Artery Disease:   Social History: Last updated: 11/03/2008 Occupation:unemployed since 9/07 Single  Smoker quit 07/2008 Alcohol use-yes;beer 1 per week Drug use-no Regular exercise-yes  Risk Factors: Alcohol Use: <1 (02/12/2009) Caffeine Use: 0 (02/11/2008) Exercise: yes (02/12/2009)  Risk Factors: Smoking Status: quit (02/12/2009) Packs/Day: 1/4 (02/12/2009) Passive Smoke Exposure: no (02/12/2009)  Review of Systems       negative other than history of present illness  Vital Signs:  Patient profile:   55 year old female Height:      66 inches Weight:      129 pounds BMI:     20.90 Pulse rate:   62 / minute Resp:     18 per minute BP sitting:   136 / 86  (left arm)  Vitals Entered By: Marrion Coy, CNA (June 17, 2010 3:12 PM)  Physical Exam  General:  pleasant, no acute distress, raspy voice Head:  normocephalic and atraumatic Eyes:  PERRLA/EOM intact; conjunctiva and lids normal. Neck:  Neck supple, no JVD. No masses, thyromegaly or abnormal cervical  nodes. Chest Wall:  no deformities or breast masses noted Lungs:  Clear bilaterally to auscultation and percussion. Heart:  PMI nondisplaced, regular rate and rhythm, normal S1-S2, no carotid bruits Msk:  Back normal, normal gait. Muscle strength and tone normal. Pulses:  pulses normal in all 4 extremities Extremities:  No clubbing or cyanosis. Neurologic:  Alert and oriented x 3. Skin:  Intact without lesions or rashes. Psych:  Normal affect.   EKG  Procedure date:  06/17/2010  Findings:      normal sinus rhythm with a short PR, LVH with significant T wave changes even anteriorly, no change since September 2010  Impression & Recommendations:  Problem # 1:  CAD, ARTERY BYPASS GRAFT (ICD-414.04) Will arrange exercise stress Myoview. Reviewed her symptoms and how to respond.no change in medication The following medications were removed from the medication list:    Lopressor 50 Mg Tabs (Metoprolol tartrate) .[name]... Take 1 tablet by mouth twice a day    Verapamil Hcl 120 Mg Tabs (Verapamil hcl) .[name]... Take 1 tablet by mouth three times a day Her updated medication list for this problem includes:    Aspirin 81 Mg Tbec (Aspirin) .[name]... Take one tablet by mouth daily    Calan Sr 240 Mg Tbcr (Verapamil hcl) .[name]... Take 1 tablet by mouth every morning    Toprol Xl 100 Mg Tb24 (Metoprolol succinate) .[name]... Take 1 and 1/2  tablets  by mouth every morning    Accupril 20 Mg Tabs (Quinapril hcl) .[name]... Take 1 tablet by mouth every 12 hours    Plavix 75 Mg Tabs (Clopidogrel bisulfate) .[name]... Take 1 tab by mouth daily  Orders: EKG w/ Interpretation (93000) Nuclear Stress Test (Nuc Stress Test)  Problem # 2:  MYOCARDIAL INFARCTION, ACUTE, SUBENDOCARDIAL/ 1997 (ICD-410.70)  The following medications were removed from the medication list:    Lopressor 50 Mg Tabs (Metoprolol tartrate) .[name]... Take 1 tablet by mouth twice a day    Verapamil Hcl 120 Mg Tabs (Verapamil hcl) .[name]... Take 1 tablet by mouth three  times a day Her updated medication list for this problem includes:    Aspirin 81 Mg Tbec (Aspirin) .[name]... Take one tablet by mouth daily    Calan Sr 240 Mg Tbcr (Verapamil hcl) .[name]... Take 1 tablet by mouth every morning    Toprol Xl 100 Mg Tb24 (Metoprolol succinate) .[name]... Take 1 and 1/2  tablets  by mouth every morning    Accupril 20 Mg Tabs (Quinapril hcl) .[name]... Take 1 tablet by mouth every 12 hours    Plavix 75 Mg Tabs (Clopidogrel bisulfate) .[name]... Take 1 tab by mouth daily  Problem # 3:  HYPERLIPIDEMIA (ICD-272.4)  Her updated medication list for this problem includes:    Simvastatin 20 Mg Tabs (Simvastatin) .[name]... Take one tablet every evening  Problem # 4:  HYPERTENSION (ICD-401.9) Assessment: Improved  The following medications were removed from the medication list:    Lopressor 50 Mg Tabs (Metoprolol tartrate) .[name]... Take 1 tablet by mouth twice a day    Verapamil Hcl 120 Mg Tabs (Verapamil hcl) .[name]... Take 1 tablet by mouth three times a day Her updated medication list for this problem includes:    Aspirin 81 Mg Tbec (Aspirin) .[name]... Take one tablet by mouth daily    Calan Sr 240 Mg Tbcr (Verapamil hcl) .[name]... Take 1 tablet by mouth every morning  Toprol Xl 100 Mg Tb24 (Metoprolol succinate) .[name]... Take 1 and 1/2  tablets  by mouth every morning    Accupril 20 Mg Tabs (Quinapril hcl) .[name]... Take 1 tablet by mouth every 12 hours  Problem # 5:  TOBACCO USE, QUIT (ICD-V15.82)  Clinical Reports Reviewed:  Cardiac Cath:  05/30/2007: Cardiac Cath Findings:   Left ventricular gram done in the RAO position showed a spade ventricle   with an EF of 75%.  There was no wall motion abnormality and no mitral   regurgitation.      ASSESSMENT:   1. Three-vessel coronary artery disease.  The circumflex and right       coronary is essentially nonobstructive disease.  However, she has a       complex bifurcation disease in the left anterior descending       involving a very large diagonal  branch.   2. Normal ejection fraction.      PLAN/DISCUSSION:  This is a difficult case.  I reviewed it with Dr.   Excell Seltzer.  Although technically, it could be do-able, percutaneously there   would be a significant risk of stent thrombosis due the bifurcation   lesion.  I suspect she is best off with bypass surgery.               Bevelyn Buckles. Bensimhon, MD   Electronically Signed         06/12/1996: Cardiac Cath Findings:  Conculsions: Ms. Messler is a 55 year old female with a circ subendocardial  infarct. She has an 80% mid circumflex lesion. She is not on aspirin today. I will start her on aspirin and  Ticlodipine. She will return tomorrow for elective circumflex intervention.  Disposition: An ACT was measured at less than 200. The sheaths were removed and pressure was placed on the groin to achieve hemostasis. The patient left the lab in stable condition.  Nanetta Batty, MD  Carotid Doppler:  12/06/2007:     SUMMARY:   -  Right internal carotid artery: There was severe and homogenous         plaque in the proximal segment of the vessel.   -  In the right proximal internal carotid artery, there was plaque         identified, resulting in a total occlusion.   -  In the left proximal internal carotid artery, there was a 60 -         79% stenosis (lower end of the range), probably due to vessel         tortuosity.   -  Vertebral artery flow antegrade bilaterally.     ---------------------------------------------------------------    Prepared and Electronically Authenticated by    Darcella Cheshire M.D.   Confirmed 06-Dec-2007 15:31:17  CXR:  12/05/2007: CXR Results:   Clinical Data: Stroke symptoms, Code stroke    PORTABLE CHEST - 1 VIEW    Comparison: 06/28/2007    Findings: The patient is status post CABG.  There is cardiomegaly   with mild vascular congestion.  No effusions, focal opacity, or   edema.    IMPRESSION:   Cardiomegaly, vascular congestion.    Read  By:  Charlett Nose,  M.D.   Released By:  Charlett Nose,  M.D.  08/27/2006: CXR Results:   Clinical Data:  Chest pain, short of breath.   CHEST - 2 VIEWS:   Comparison:   06/10/07.   Two views of the chest show no active infiltrate or effusion.  Minimal atelectasis remains at the lung bases.  Heart size is stable.   Median sternotomy sutures are noted.   IMPRESSION:   Minimal basilar atelectasis.  No pneumonia or effusion.    Read By:  Juline Patch,  M.D.   Patient Instructions: 1)  Your physician recommends that you schedule a follow-up appointment in: 1 YEAR WITH DR. WALL 2)  Your physician recommends that you continue on your current medications as directed. Please refer to the Current Medication list given to you today. 3)  Your physician has requested that you have an exercise stress myoview.  For further information please visit [email or web address]  Please follow instruction sheet, as given. 4)  DO NOT TAKE YOUR TOPROL THE MORNING OF YOUR STRESS TEST

## 2010-09-13 NOTE — Progress Notes (Signed)
Summary: Nuclear Pre-Procedure  Phone Note Outgoing Call   Call placed by: Milana Na, EMT-P,  June 29, 2010 11:56 AM Summary of Call: Reviewed information on Myoview Information Sheet (see scanned document for further details).  Left message on her answering machine.     Nuclear Med Background Indications for Stress Test: Evaluation for Ischemia, Graft Patency   History: Angioplasty, CABG, Heart Catheterization, Myocardial Infarction  History Comments: '97 MI--Angioplasty-CFX 10/08 Heart Cath-- CABG x4  Symptoms: DOE    Nuclear Pre-Procedure Cardiac Risk Factors: Carotid Disease, CVA, Family History - CAD, History of Smoking, Hypertension, Lipids Height (in): 66  Nuclear Med Study Referring MD:  T.Wall    Appended Document: Cardiology Nuclear Testing stable, no change in treatment.  Appended Document: Cardiology Nuclear Testing Tried to leave message but number has been disconnected. Mylo Red RN

## 2010-09-13 NOTE — Medication Information (Signed)
Summary: Plavix  Plavix   Imported By: Marylou Mccoy 03/21/2010 16:38:36  _____________________________________________________________________  External Attachment:    Type:   Image     Comment:   External Document

## 2010-09-13 NOTE — Miscellaneous (Signed)
Summary: refill plavix  Clinical Lists Changes  Medications: Changed medication from PLAVIX 75 MG TABS (CLOPIDOGREL BISULFATE) Take 1 tab by mouth daily (samples and [facility]) to PLAVIX 75 MG TABS (CLOPIDOGREL BISULFATE) Take 1 tab by mouth daily - Signed Rx of PLAVIX 75 MG TABS (CLOPIDOGREL BISULFATE) Take 1 tab by mouth daily;  #90 x 3;  Signed;  Entered by: Vikki Ports;  Authorized by: Gaylord Shih, MD, [facility];  Method used: Print then Give to Patient    Prescriptions: PLAVIX 75 MG TABS (CLOPIDOGREL BISULFATE) Take 1 tab by mouth daily  #90 x 3   Entered by:   Vikki Ports   Authorized by:   Gaylord Shih, MD, [facility]   Signed by:   Vikki Ports on 03/10/2010   Method used:   Print then Give to Patient   RxID:   [id number]

## 2010-09-13 NOTE — Progress Notes (Signed)
  [employer]ner[employer] request received sent to [facility]  June 02, 2010 8:34 AM

## 2010-09-13 NOTE — Letter (Signed)
Summary: FAXED REQUESTED RECORDS TO [facility]  FAXED REQUESTED RECORDS TO [facility]   Imported By: Arta Bruce 12/17/2009 14:23:17  _____________________________________________________________________  External Attachment:    Type:   Image     Comment:   External Document

## 2010-09-13 NOTE — Progress Notes (Signed)
Summary: Records request  Records request received from the fax machine. Forwarded to [employer] for processing.

## 2010-09-15 NOTE — Assessment & Plan Note (Signed)
Summary: Cardiology Nuclear Testing  Nuclear Med Background Indications for Stress Test: Evaluation for Ischemia, Graft Patency   History: Angioplasty, CABG, Heart Catheterization, Myocardial Infarction  History Comments: '08 CABG  Symptoms: DOE, Fatigue    Nuclear Pre-Procedure Cardiac Risk Factors: Carotid Disease, CVA, Family History - CAD, History of Smoking, Hypertension, Lipids Caffeine/Decaff Intake: none NPO After: 6:00 AM Lungs: Clear IV 0.9% NS with Angio Cath: 22g     IV Site: R Hand IV Started by: Cathlyn Parsons, RN Chest Size (in) 34     Cup Size B      Height (in): 66 Weight (lb): 131 BMI: 21.22 Tech Comments: Toprol held x 28 hours.  Nuclear Med Study 1 or 2 day study:  1 day     Stress Test Type:  Treadmill/Lexiscan Reading MD:  Olga Millers, MD     Referring MD:  Valera Castle, MD Resting Radionuclide:  Technetium 34m Tetrofosmin     Resting Radionuclide Dose:  11 mCi  Stress Radionuclide:  Technetium 81m Tetrofosmin     Stress Radionuclide Dose:  33 mCi   Stress Protocol Exercise Time (min):  7:15 min     Max HR:  111 bpm     Predicted Max HR:  167 bpm  Max Systolic BP: 212 mm Hg     Percent Max HR:  66.47 %     METS: 7.0 Rate Pressure Product:  69629  Lexiscan: 0.4 mg   Stress Test Technologist:  [employer]a [employer], CMA-N     Nuclear Technologist:  Doyne Keel, CNMT  Rest Procedure  Myocardial perfusion imaging was performed at rest 45 minutes following the intravenous administration of Technetium 41m Tetrofosmin.  Stress Procedure  The patient attempted to walk on the treadmill using the Bruce protocol for 6:00, but was unable to get her heart rate up due to a hypertensive response, 211/116.  She then received lexiscan 0.4 mg over 15-seconds with concurrent low level exercise and then Technetium 52m Tetrofosmin was injected at 30-seconds.  The EKG was nondiagnostic due to baseline tracing of LVH with strain.  She did have occasional PAC's and PVC's.   There were no c/o chest pain.   Quantitative spect images were obtained after a 45 minute delay.  QPS Raw Data Images:  Acquisition technically good; normal left ventricular size. Stress Images:  There is decreased uptake in the lateral wall. Rest Images:  There is decreased uptake in the lateral wall. Subtraction (SDS):  There is a fixed defect that is most consistent with a previous infarction. Transient Ischemic Dilatation:  .98  (Normal <1.22)  Lung/Heart Ratio:  .23  (Normal <0.45)  Quantitative Gated Spect Images QGS EDV:  96 ml QGS ESV:  38 ml QGS EF:  60 % QGS cine images:  Lateral akinesis.   Overall Impression  Exercise Capacity: Fair exercise capacity. BP Response: Hypertensive blood pressure response. Clinical Symptoms: There is dyspnea. ECG Impression: ECG not interpretable due to baseline changes. Overall Impression: Abnormal stress nuclear study with prior lateral infarct; no ischemia.  Appended Document: Cardiology Nuclear Testing stable, no change in treatment.  Appended Document: Cardiology Nuclear Testing Tried to leave message but number has been disconnected. Mylo Red RN  Appended Document: Cardiology Nuclear Testing pt aware of results

## 2010-09-15 NOTE — Progress Notes (Signed)
Summary: Still having right-side pain  Phone Note Call from Patient Call back at (cell)  [phone]   Summary of Call: States completed Z-pak from ED visit (see last phone note re cough) and cough is better, but right-sided rib cage is still hurting a lot.  Reviewed with Jesse Fall - advised pt. that pain is most likely due to residual muscular strain from to coughing and will take time to resolve.  Also advised that xrays were negative for trauma or other disease process.  Instructed to avoid NSAIDS due to taking Plavix and aspirin -- to take Tylenol ES 500 mg. tablets, two tabs by mouth every six hours, not to exceed 3000 mg. per day.  To apply moist heat x20 min., every 2 hours as able.  To call back if pain increases or does not seem to be improving within the next couple of weeks.  Verbalized understanding and agreement. Initial call taken by: Dutch Quint RN,  September 05, 2010 3:48 PM

## 2010-09-15 NOTE — Progress Notes (Signed)
Summary: cold,cough,sore throat  Phone Note Call from Patient Call back at [phone] cell   Reason for Call: Talk to Nurse Summary of Call: [facility] pt. ms Wild says that she has a chest cold, fever at night, hurting on the right side of her rib cage, sore throat, couging all night where she can't even sleep, no problems with her ears.  Initial call taken by: Leodis Rains,  August 24, 2010 10:13 AM  Follow-up for Phone Call        Started four days ago.  Has not taken anything for cough, just drinking lots of water and gatorade.  Cough is more dry, hacking, especially at night, can't sleep.  occasional yellowish mucus, usually clear.  Nasal congestion only at night, nasal drainage clear, eyes are watery.  Stomach and right side of ribcage are sore all the time for the past 2-3 days.  Denies SOB or labored breathing, CP, chest tightness.  Denies ear pain.  Has woken up with bad headache for past few days.  States has been very warm, no accurate temp taken.  Nausea/vomiting two days ago xtwo episodes, none since.  Denies diarrhea.  Appetite poor, sour taste in mouth.  Last 3-4 days very fatigued, unable to do usual activities.    Advised to go to UC/ED for evaluation and treatment since she has past hx of CVA / MI.  Also advised re cold protocol for home management until she can be seen - home humidification, hydration, OTC pain and fever relievers, cough medication.  Verbalized understanding and agreement. Follow-up by: Dutch Quint RN,  August 24, 2010 10:54 AM  Additional Follow-up for Phone Call Additional follow up Details #1::        Call pt to see how her symptoms are and if she went for treatment as advised Additional Follow-up by: Lehman Prom FNP,  August 25, 2010 8:43 AM    Additional Follow-up for Phone Call Additional follow up Details #2::    Left message on voicemail for pt. to return call.  Dutch Quint RN  August 25, 2010 9:24 AM  Micah Flesher to ED last night- was trying to  hold out for this morning, but felt too bad.    Dx with bronchitis, Rx'd azithromycin Z-pak.  Feels a little better than yesterday.  Had Z-pak filled at [facility].  See ED record - in your refill rack.   Dutch Quint RN  August 25, 2010 12:18 PM  reviewed. n.[email or web address],fnp August 25, 2010 5:09 PM         X-ray  Procedure date:  08/24/2010  Findings:      No acute cardiopulmonary findings   X-ray  Procedure date:  08/24/2010  Findings:      No acute cardiopulmonary findings

## 2010-11-20 LAB — WET PREP, GENITAL: Yeast Wet Prep HPF POC: NONE SEEN

## 2010-11-20 LAB — POCT URINALYSIS DIP (DEVICE)
Glucose, UA: NEGATIVE mg/dL
Hgb urine dipstick: NEGATIVE
Protein, ur: NEGATIVE mg/dL
Specific Gravity, Urine: 1.02 (ref 1.005–1.030)
Urobilinogen, UA: 0.2 mg/dL (ref 0.0–1.0)

## 2010-11-20 LAB — DIFFERENTIAL
Eosinophils Absolute: 0.1 10*3/uL (ref 0.0–0.7)
Eosinophils Relative: 1 % (ref 0–5)
Lymphs Abs: 3.3 10*3/uL (ref 0.7–4.0)
Monocytes Absolute: 0.6 10*3/uL (ref 0.1–1.0)
Monocytes Relative: 7 % (ref 3–12)

## 2010-11-20 LAB — CBC
MCHC: 32.6 g/dL (ref 30.0–36.0)
RDW: 15.4 % (ref 11.5–15.5)

## 2010-11-20 LAB — COMPREHENSIVE METABOLIC PANEL
ALT: 51 U/L — ABNORMAL HIGH (ref 0–35)
AST: 38 U/L — ABNORMAL HIGH (ref 0–37)
Albumin: 4.7 g/dL (ref 3.5–5.2)
Calcium: 10.3 mg/dL (ref 8.4–10.5)
GFR calc Af Amer: >60 mL/min (ref >60)
Sodium: 141 mEq/L (ref 135–145)
Total Protein: 7.9 g/dL (ref 6.0–8.3)

## 2010-11-20 LAB — AMYLASE: Amylase: 92 U/L (ref 27–131)

## 2010-11-20 LAB — GC/CHLAMYDIA PROBE AMP, GENITAL: Chlamydia, DNA Probe: NEGATIVE

## 2010-12-27 NOTE — Op Note (Signed)
NAMECAROL, THEYS NO.:  [id number]   MEDICAL RECORD NO.:  [id number]          PATIENT TYPE:  INP   LOCATION:  2314                         FACILITY:  [facility]   PHYSICIAN:  Kerin Perna, M.D.  DATE OF BIRTH:  November 19, 1955   DATE OF PROCEDURE:  06/03/2007  DATE OF DISCHARGE:                               OPERATIVE REPORT   OPERATION:  1. Coronary artery bypass grafting x4 (left internal mammary artery      LAD, saphenous vein graft to the diagonal, saphenous vein graft to      circumflex marginal, saphenous vein graft to posterior descending).  2. Endoscopic vein harvest of the right leg, greater saphenous vein.   SURGEON:  Kerin Perna, M.D.   ASSISTANT:  Rowe Clack, P.A.-C. and Nonah Mattes   ANESTHESIA:  General.   PRE-AND-POSTOPERATIVE DIAGNOSIS:  Severe 3-vessel coronary disease with  unstable angina.   INDICATIONS:  The patient is a 55 year old hypertensive female who  presented with unstable angina, and had only mild elevation in her  cardiac enzymes.  She was stabilized on heparin-Integrilin and underwent  cardiac catheterization which demonstrated a multivessel coronary  disease with a fairly well-preserved LV function.  Her 2-D echo showed  no significant valvular disease with some posterior hypokinesia and LVH.  She was felt to be a candidate for surgical evaluation.  She was  initially evaluated by Dr. Dorris Fetch who felt the patient was an  acceptable surgical candidate, and scheduled her for surgery for surgery  this morning.  I examined the patient in the preop holding area, and  reviewed the results of the cardiac cath with the patient and her family  and discussed the operation to which she agreed.   OPERATIVE FINDINGS:  The patient has a significant chronic lung disease.  The patient had significant LVH.  The patient developed intraoperative  anemia with a hemoglobin of less than 7 grams and received 1 unit of  packed cells.   The patient was on Integrilin preoperatively so she  received some platelets following reversal of heparin with the  protamine.  The coronary lesions were very distal and it would be  difficult to place later grafts more distally, than the grafts that were  placed today.  The LAD was deeply intramyocardial.   DESCRIPTION OF PROCEDURE:  The patient was brought to the operating room  and placed supine on the operating table, and general anesthesia was  induced.  The chest, abdomen, and legs were prepped with Betadine and  draped as a sterile field.  A sternal incision was made, and the  saphenous vein was harvested endoscopically from the right leg.  The  left internal mammary artery was harvested as a pedicle graft from its  origin at the subclavian vessels.  It was a 1.0 x 1.5 mm vessel with  adequate flow.   The sternal retractor was placed and the pericardium was opened and  suspended.  Heparin was administered.  When the vein had been adequately  harvested,. inspected, and prepared, pursestrings placed in the  ascending aorta and right atrium and the patient  was then cannulated and  placed on bypass.  The distal coronaries were identified for grafting,  and the mammary artery and vein grafts were prepared for the distal  anastomoses.  Cardioplegic  catheters were placed for both antegrade and  retrograde cold blood cardioplegia.  The patient was cooled to 32  degrees and the aortic crossclamp was applied.   Cardioplegia was delivered between the antegrade aortic and retrograde  coronary sinus catheters to a total of 800 mL volume.  There was a good  cardioplegic arrest, and septal temperature dropped to less than 15  degrees.  The distal coronary anastomoses were performed.  The first  distal anastomosis was to the distal posterior descending.  It had a mid  vessel 80% plaque, and a reverse saphenous vein was sewn end-to-side  with running 7-0 Prolene with good flow through the  graft.   A second distal anastomosis was to the distal circumflex.  This was a  1.5 mm vessel with a distal long 80% narrowing, and a reverse saphenous  vein was sewn end-to-side to the vessel just beyond the obstruction.  Cardioplegia was redosed.  The third distal anastomosis was to the large  diagonal which was intramyocardial, and had a proximal 80% stenosis.  A  reverse saphenous vein was sewn end-to-side with running 7-0 Prolene.  There was good flow through the graft.  Cardioplegia was redosed.  The  fourth distal anastomosis was in the mid LAD which was deeply  intramyocardial.  The left IMA pedicle was brought through an opening  created, and the left lateral pericardium was brought down onto the LAD  and sewn end-to-side with running 8-0 Prolene.  There was good flow  through the anastomosis after briefly releasing the pedicle bulldog on  the mammary pedicle.  The bulldog was reapplied and the pedicle was  secured to the epicardium.  Cardioplegia was redosed.   While the crossclamp was still in place, three proximal and three  proximal vein anastomoses were performed on the ascending aorta using a  4.0-mm punch and a running 7-0 Prolene.  Prior to tying down the final  proximal anastomosis, air was vented from the coronaries, and the left  side of heart with a dose of retrograde warm blood cardioplegia.  The  final proximal anastomosis was tied and the crossclamp was removed.   The heart resumed a spontaneous rhythm.  Air was aspirated from the vein  grafts with a 27-gauge needle, and each graft was checked and found to  be with good flow and hemostasis was documented, at the proximal and  distal anastomoses.  The cardioplegic catheters had been removed, and  the patient was rewarmed to 37 degrees.  Temporary pacing wires were  applied.  When the patient was rewarmed and reperfused, the lungs were  re-expanded; and the ventilator was resumed.  The patient was weaned  from  bypass on low-dose dopamine with stable blood pressure and cardiac  output.   Protamine was administered without adverse reaction, and the cannulas  were removed.  The mediastinum was irrigated with warm antibiotic  irrigation.  The leg incision was irrigated and closed in a standard  fashion.  The superior pericardial fat was closed over the aorta.  Two  mediastinal and a left pleural chest tube were placed, and brought out  through separate incisions.  The sternum was closed with interrupted  steel wire.  The pectoralis fascia was closed with a running #1 Vicryl.  The subcutaneous and skin layers  were closed in running Vicryl, and  sterile dressings were applied.  Total bypass time was 140 minutes with  crossclamp time of 88 minutes.      Kerin Perna, M.D.  Electronically Signed     PV/MEDQ  D:  06/03/2007  T:  06/04/2007  Job:  [id number]   cc:   [facility] Office  Bevelyn Buckles. Bensimhon, MD

## 2010-12-27 NOTE — Cardiovascular Report (Signed)
NAMEKIRSTEIN, BAXLEY NO.:  [id number]   MEDICAL RECORD NO.:  [id number]          PATIENT TYPE:  INP   LOCATION:  2926                         FACILITY:  [facility]   PHYSICIAN:  Bevelyn Buckles. Bensimhon, MDDATE OF BIRTH:  07-Feb-1956   DATE OF PROCEDURE:  DATE OF DISCHARGE:                            CARDIAC CATHETERIZATION   DATE OF PROCEDURE:  May 30, 2007.   REASON FOR CATHETERIZATION:  Non-ST-elevation myocardial infarction.   HISTORY:  Ms. Kosch a 55 year old woman with a history of hypertension,  hyperlipidemia and polysubstance abuse.  Her urine drug screen was  positive for cocaine and marijuana.  She has known history of coronary  artery disease, status post angioplasty to the circumflex in 1997.  She  was admitted with chest pain with prominent anterolateral T-wave  inversions.  She ruled in for myocardial infarction with serial cardiac  enzymes.  She is brought to the catheterization lab for diagnostic  angiography.   PROCEDURES PERFORMED:  1. Selective coronary angiography.  2. Left heart cath.  3. Left ventriculogram.  4. Left subclavian angiography.  5. Angioseal femoral closure.   DESCRIPTION OF PROCEDURE:  Risks and indication of the catheterization  were explained.  Consent was signed and placed on the chart.  A 6-French  arterial sheath was placed in the right femoral artery using modified  Seldinger technique.  Standard catheters including JL-4, JR-4 and angled  pigtail were used for the procedure.  All catheter exchanges were made  over a wire.  There are no apparent complications.  Central aortic  pressure was 139/93 with a mean of 110.  LV pressure was 122/3 with EDP  of 17.  There was no aortic stenosis on pullback across the aortic  valve.   Left main was normal.   LAD was a large vessel wrapping the apex.  It gave off a very large  branching diagonal in the proximal portion.  Both the LAD and diagonal  were heavily calcified  proximally.  There was a 70% to 80% lesion in the  proximal to mid-LAD spanning the ostium of the first diagonal.  In the  ostium of the first diagonal, there was an 80% to 90% focal lesion.  There was 60% tubular lesion in the midportion of the lower branch of  the diagonal.   Left circumflex was a moderate-sized vessel that gave off to a moderate  size ramus branch, a small OM1 and a large posterolateral branch.  There  is a 40% lesion in the proximal to mid AV groove circ followed by a  stent which was widely patent.  There was a 50% tubular lesion in the  proximal portion of the posterolateral and a 60% to 70% lesion distally.   Right coronary artery was a large dominant vessel with heavy  calcification proximally.  There was a 30% lesion proximally.  It gave  off a PDA and posterolateral.  In the PDA, there was a 40% ostial lesion  and a 70% to 80% mid-lesion.   Left subclavian was widely patent.  The LIMA was patent to the chest  wall.   Left  ventricular gram done in the RAO position showed a spade ventricle  with an EF of 75%.  There was no wall motion abnormality and no mitral  regurgitation.   ASSESSMENT:  1. Three-vessel coronary artery disease.  The circumflex and right      coronary is essentially nonobstructive disease.  However, she has a      complex bifurcation disease in the left anterior descending      involving a very large diagonal branch.  2. Normal ejection fraction.   PLAN/DISCUSSION:  This is a difficult case.  I reviewed it with Dr.  Excell Seltzer.  Although technically, it could be do-able, percutaneously there  would be a significant risk of stent thrombosis due the bifurcation  lesion.  I suspect she is best off with bypass surgery.      Bevelyn Buckles. Bensimhon, MD  Electronically Signed     DRB/MEDQ  D:  05/30/2007  T:  05/31/2007  Job:  [id number]

## 2010-12-27 NOTE — Assessment & Plan Note (Signed)
[facility]                                 ON-CALL NOTE   NAME:GOINSBerdie, Kimberly                      MRN:          [id number]  DATE:09/28/2007                            DOB:          03-Feb-1956    PRIMARY CARDIOLOGIST:  Dr. Juanito Doom.   I have received a phone call from Kimberly [name] concerning her blood  pressure medication.  She was seen by Dr. Myrtis Ser on September 25, 2007  secondary to elevated blood pressure in cardiac rehab despite taking her  medications.  The patient is normally seen by Dr. Daleen Squibb, but was out of  town, so Dr. Myrtis Ser graciously accepted to see this patient that day as he  was doc of the day.   On review of the patient's medication and blood pressure, it was decided  by Dr. Myrtis Ser to increase her lisinopril from 20 mg once a day to 20 mg  twice a day, and she is to continue taking her metoprolol 25 mg twice a  day.  He also gave her a prescription for amlodipine 5 mg once a day.   The patient did call stating that she was unable to afford her  amlodipine.  She stated that it cost $35 and she could not afford that,  and requested a different medication for her blood pressure.  I had  considered starting her on some clonidine, but I wanted to make sure  that Dr. Daleen Squibb saw her before doing so, and was made aware that she was  unable to afford the amlodipine.  I felt it would probably be best to  follow up with a blood pressure check on the increased dose of  lisinopril before adding another medication, even though she was given  amlodipine but was unable to afford it.   I have left a message with the office to follow up with her after  speaking with Dr. Daleen Squibb so that there could be a blood pressure check and  followup sooner than later to advise on medication changes.  I did pass  that on to the patient and let her know that the office would be calling  her for followup.      Bettey Mare. Lyman Bishop, NP  Electronically Signed      Pricilla Riffle, MD, [facility]  Electronically Signed   KML/MedQ  DD: 09/28/2007  DT: 09/30/2007  Job #: [id number]

## 2010-12-27 NOTE — Assessment & Plan Note (Signed)
[facility]                            CARDIOLOGY OFFICE NOTE   Kimberly, [name]                      MRN:          [id number]  DATE:05/29/2008                            DOB:          Dec 28, 1955    Kimberly [name] comes in today for followup.   PROBLEM LIST:  1. Coronary artery disease status post coronary artery bypass      grafting.  She is having no angina.  2. Hypertension.  She checks this at the drugstore, but she cannot      remember her values.  She says it goes up and down quite a bit.  3. History of hyperlipidemia.  4. Tobacco use, which she has quit.  5. History of a stroke.  She had the stroke in December 05, 2007 and      received intravenous tissue plasminogen activator.  At that time,      she was found to have a right posterior cerebral artery occlusion,      asymptomatic right common carotid artery occlusion, and a left      carotid stenosis.  She had Plavix added to her aspirin.   Since that time she has done well.  She promises she is not smoking.   CURRENT MEDICATIONS:  1. Plavix 75 mg a day.  2. Pravastatin 20 mg a day.  3. Toprol-XL 150 mg a day.  4. Accupril 20 mg a day.  5. Verapamil 240 mg a day.  6. Aspirin 81 mg a day.   She said her Toprol was just increased to one and half per day from  [facility].   She is able to get all her medications.   PHYSICAL EXAMINATION:  VITAL SIGNS:  Her blood pressure today was  146/110.  I rechecked it was 160 over about 90.  She took her medicines  one at 7 a.m.  Her pulse is 68 and regular.  Her weight is 140.  HEENT:  Normal.  NECK:  Carotids upstrokes were equal bilaterally without bruits, no JVD.  Thyroid is not enlarged.  Trachea is midline.  LUNGS:  Clear.  HEART:  Regular rate and rhythm.  No gallop.  ABDOMEN:  Soft, good bowel sounds.  EXTREMITIES:  No edema.  Pulses are intact.  NEUROLOGIC:  Intact.   Her EKG shows normal sinus rhythm with biatrial enlargement with  LVH  with strain.  Compared to her previous ECGs, this is unchanged.   ASSESSMENT AND PLAN:  I am concerned about Kimberly [name] hypertension,  particularly with a recent history of stroke.  I have increased her  verapamil to 240 b.i.d.  I have asked her to check her blood pressure on  a regular basis with a goal of less than 140/90.  I will see her back  again in 6 months.     Thomas C. Daleen Squibb, MD, [facility]  Electronically Signed    TCW/MedQ  DD: 05/29/2008  DT: 05/30/2008  Job #: [id number]   cc:   Melvern Banker

## 2010-12-27 NOTE — Assessment & Plan Note (Signed)
OFFICE VISIT   Kimberly [name], Kimberly [name]  DOB:  [birth year]                                        July 05, 2007  CHART #:  [id number]   CURRENT PROBLEMS:  1. Right leg swelling status post CABG x4 on June 03, 2007.  2. Hypertension.  3. Tobacco abuse.  4. COPD.   HISTORY OF PRESENT ILLNESS:  Kimberly [name] returns with a duplex ultrasound  of the right leg to rule out DVT after right leg endovein harvest for a  CABG.  The report indicates that she does not have DVT, but there is  hematoma in the endovein tunnel.  The leg does look better.  It is less  swollen.  She is walking better and the foot is warm with good  perfusion.  The leg incisions are healing as is the chest incision.  Her  heart rhythm is regular and her breath sounds are clear.   PLAN:  The patient was recommended to continue leg elevations, but to  take three walks daily.  I told her not to drive for another two weeks  at which time I will recheck her and plan on starting her on her  outpatient cardiac rehab at that point.  No prescriptions were provided  this office visit.   Kerin Perna, M.D.  Electronically Signed   PV/MEDQ  D:  07/05/2007  T:  07/06/2007  Job:  [id number]

## 2010-12-27 NOTE — Consult Note (Signed)
[name], WEIHER NO.:  [id number]   MEDICAL RECORD NO.:  [id number]          PATIENT TYPE:  INP   LOCATION:  2314                         FACILITY:  [facility]   PHYSICIAN:  Salvatore Decent. Dorris Fetch, M.D.DATE OF BIRTH:  [birth year]   DATE OF CONSULTATION:  06/02/2007  DATE OF DISCHARGE:                                 CONSULTATION   HISTORY OF PRESENT ILLNESS:  Kimberly [name] is a 55 year old African American  female with a history of coronary disease.  She says she had a slight MI  back in 1997.  She was catheterized at that time and treated medically.  She also has a history of hypertension.  She had been in her usual state  of health, although, she had been under quite a deal of stress recently  until May 29, 2007, when she had a burning sensation in her chest.  She thought this was heartburn and took some Tums initially.  The pain  worsened and she took additional Tums and then the pain became very  severe.  She ultimately came to the emergency room and had lateral ST-  segment changes.  She subsequently ruled in for a non-Q-wave myocardial  infarction with a troponin of 9.6.  She underwent cardiac  catheterization on May 30, 2007, where she was found to have three-  vessel disease with complex LAD lesion at the take off a large  bifurcating diagonal branch.  She also had significant disease in her  left circumflex.  Her right coronary had some mild disease proximally.  There was a 70-80% mid posterior descending stenosis.  The patient was  started on heparin and Integrilin.  Had resolution of her pain, and has  not had pain since catheterization.  I was called yesterday and was able  to see the patient this morning.  The patient is currently pain free.   PAST MEDICAL HISTORY:  1. Coronary disease.  2. Previous mild heart attack.  3. Hypertension.  4. Tobacco abuse.  5. Cocaine abuse.  6. Marijuana abuse.   MEDICATIONS ON ADMISSION:  1. Toprol XL 50 mg  daily.  2. Lisinopril/hydrochlorothiazide 20/12.50 one tablet daily.  3. Aspirin 81 mg daily.   ALLERGIES:  She has no known drug allergies.   FAMILY HISTORY:  Significant for hypertension, diabetes.   SOCIAL HISTORY:  She lives with her fiance.  She has been laid off.  She  has been doing Agricultural consultant work.  She smokes about half-a-pack a day and  has for 30 years.  She does use occasional marijuana.  She denied  cocaine use, but was positive for cocaine on her tox screen.   REVIEW OF SYSTEMS:  Gums occasionally bleed when brushing.  She has had  some chest discomfort as described, shortness of breath occasionally has  palpitations.  She has had urinary frequency.  She has been anxious.  She does have what she describes as nausea and reflux symptoms.  All  other systems are negative.   PHYSICAL EXAMINATION:  GENERAL:  Kimberly [name] is a 55 year old African  American female in no acute distress.  VITAL SIGNS:  Blood pressure is 130/90, pulse is 65 and regular,  respirations are 20, she is on room air with 99% oxygen saturation.  She  is anxious and tearful, but in no acute distress.  NEUROLOGICAL:  She is alert, oriented x3, appropriate and grossly  intact.  HEENT:  Remarkable for poor dentition.  CHEST:  Clear with equal breath sounds bilaterally.  CARDIAC:  Exam has regular rate and rhythm.  Normal S1-S2.  There is a  1/6 systolic murmur.  ABDOMEN:  Soft, nontender.  EXTREMITIES:  Without clubbing, cyanosis or edema.  She has 2+ pulses  throughout.  There is no peripheral edema.   LABORATORY DATA:  EKG reveals sinus bradycardia.  There is left  ventricular hypertrophy.  There is ST depression in V2-V6.  Chest x-ray  showed no active disease.  Her white count 6.6, hematocrit 38, platelets  224.  Sodium is 140, potassium 4.0, BUN and creatinine are 7 and 0.9,  glucose 88.  Peak CK is 500 with an MB of 71, troponin of 9.6.   IMPRESSION:  Kimberly [name] is a 55 year old woman with history  of coronary  disease, multiple cardiac risk factors including tobacco and marijuana  use and cocaine use who presents with a non-Q-wave myocardial infarction  at catheterization.  She has three-vessel disease with complex LAD  disease.  Is not amendable to percutaneous intervention.  Coronary  artery bypass grafting is the best option for treatment.  I have  discussed with the patient the indications, risks, benefits and  alternatives.  She understands the indications for survival benefit as  well as relief of symptoms.  She understands the risks include but are  not limited to death, stroke, MI, DVT, PE, bleeding, possible need for  transfusions, infection as well as other organ systems  functioning,respiratory, renal or GI complications.  She understands and  accepts these risks and agrees to proceed with surgery.  We will arrange  for surgery at the earliest possible time, which may be Tuesday October  21, although, I will check with one my partners to see if we could  possibly do it tomorrow.      Salvatore Decent Dorris Fetch, M.D.  Electronically Signed     SCH/MEDQ  D:  06/02/2007  T:  06/03/2007  Job:  [id number]   cc:   Thomas C. Daleen Squibb, MD, [facility]  Fanny Dance. Rankins, M.D.

## 2010-12-27 NOTE — Assessment & Plan Note (Signed)
[facility]                            CARDIOLOGY OFFICE NOTE   YOANA, STAIB                      MRN:          [id number]  DATE:11/28/2007                            DOB:          [birth year]    HISTORY:  Ms. Tuite returns today for further management of her coronary  artery disease.   She has all kinds of problems trying to afford her medications.  She has  been going through [facility].  She says she has not  gotten her pravastatin filled at all.  They want to put her on Lipitor  because they have samples.   We have had several phone calls back and forth about switching this,  that and the other.  It is just a logistical nightmare.  She is having  no angina symptoms of coronary disease.  She denies any orthopnea, PND  or peripheral edema.   MEDICATIONS:  1. She is on aspirin 81 mg a day.  2. Verapamil 240 mg a day.  3. Accupril 20 mg p.o. b.i.d.  4. Toprol XL 100 mg a day.  5. Omega 3.   PHYSICAL EXAMINATION:  VITAL SIGNS:  Blood pressure today is 144/108,  pulse is 73 and regular.  She says she has taken her medications today.  Weight is 134.  HEENT:  Normocephalic, atraumatic.  PERRLA.  Extraocular movements are  intact.  Sclerae are clear.  Face symmetry is normal.  NECK:  Carotid upstrokes were equal bilaterally without bruits.  No JVD.  Thyroid is not enlarged.  Trachea is midline.  LUNGS:  Clear.  HEART:  Reveals a regular rate and rhythm.  ABDOMEN:  Soft, good bowel sounds.  EXTREMITIES:  Reveals no cyanosis, clubbing or edema.  Pulses are  intact.   DIAGNOSTICS:  Electrocardiogram shows normal sinus rhythm with deep T-  wave inversion in the  anterolateral leads.  This is unchanged from before.   I have had a long talk with Ms. Schmaltz today.  Changing back and forth  with all these different medications is absolutely poor medical care.  It has entailed a lot of phone calls back and forth and changing  this  and that.  After a long discussion, she says she can afford the  pravastatin and she can take the other medication she is taking.  We  have renewed this through [facility].   In addition, I have told her to stop the omega 3 since that is not as  strongly proven to benefit her as the other medications are.  I would  prefer her to take those that have been strongly proven to reduce  morbidity and mortality with cardiovascular disease.   It should be should be noted that all of her medications are generic.     Thomas C. Daleen Squibb, MD, [facility]  Electronically Signed    TCW/MedQ  DD: 11/28/2007  DT: 11/28/2007  Job #: [id number]

## 2010-12-27 NOTE — Assessment & Plan Note (Signed)
[facility]                            CARDIOLOGY OFFICE NOTE   Kimberly [name], Kimberly [name]                      MRN:          [id number]  DATE:09/25/2007                            DOB:          [birth year]    HISTORY:  We received a call from cardiac rehab today about Kimberly [name].  Her pressure was higher than usual and we decided to add her on to my  schedule in the office.  She is not having any chest pain.  Her pressure  at rehab was in the range of 165/110.  Pressures in the last several  days at rehab have been in the range of 150/98.  She is on metoprolol.  She is on lisinopril 20 mg.  She says that she is taking her medicines.  She did take the medicine today.  Historically, she has had good renal  function.   ALLERGIES:  NO KNOWN DRUG ALLERGIES.   MEDICATIONS:  1. Lisinopril 20.  2. Metoprolol 50 b.i.d.  3. Aspirin 81.   OTHER MEDICAL PROBLEMS:  See the list below.   REVIEW OF SYSTEMS:  She is really feeling okay today.  Her review of  systems otherwise is negative.   PHYSICAL EXAMINATION:  VITAL SIGNS:  Blood pressure today in the office  is 170/105 in the right arm and  175/126 in the left arm.  GENERAL:  The patient is oriented to person, time and place.  Affect is  normal.  HEENT:  Reveals no xanthelasma.  She has normal extraocular  motion.  NECK:  There are no carotid bruits.  There is no jugular venous  distention.  LUNGS:  Clear.  Respiratory effort is not labored.  CARDIAC:  Reveals S1-S2.  There are no clicks or significant murmurs.  ABDOMEN:  Soft.  EXTREMITIES:  She has no peripheral edema.   PROBLEM:  1. Coronary disease post-coronary artery bypass graft in October 2008.  2. History of some substance abuse in the past.  She says that she is      clean other than an occasional cigarette.  3. Hyperlipidemia.  4. Hypertension.  Her blood pressure is elevated.  We will push her      lisinopril from 20 once a day to 20 twice a  day and she can take      that at the same time that she takes her metoprolol twice a day.      Although it is not the optimal drug for diastolic hypertension, I      feel that trying      amlodipine will be a good choice for her and we will start her on 5      mg a day.  She will then have follow up for blood pressure and      follow up with Dr. Daleen Squibb.     Luis Abed, MD, [name]  Electronically Signed    JDK/MedQ  DD: 09/25/2007  DT: 09/26/2007  Job #: [id number]

## 2010-12-27 NOTE — H&P (Signed)
Kimberly [name], Kimberly [name] NO.:  [id number]   MEDICAL RECORD NO.:  [id number]          PATIENT TYPE:  INP   LOCATION:  2926                         FACILITY:  [facility]   PHYSICIAN:  Jesse Sans. Wall, MD, FACCDATE OF BIRTH:  [birth year]   DATE OF ADMISSION:  05/29/2007  DATE OF DISCHARGE:                              HISTORY & PHYSICAL   PRIMARY CARDIOLOGIST:  Maisie Fus C. Daleen Squibb, MD, [facility]   PRIMARY CARE PHYSICIAN:  Turkey R. Rankins, M.D. of [facility].   HISTORY OF PRESENT ILLNESS:  This is a 55 year old African American  female with known history of hypertension and questionable coronary  artery disease who may or may not have had a cardiac catheterization  with angioplasty in 1997 through [facility]; records are being obtained.  The patient awoke this morning, after getting up she felt what she  describes as bad heartburn.  The pain lasted all morning long and got  worse with associated dyspnea and nausea.  As a result of this, the  patient came to the emergency room.  The patient also noticed that her  blood pressure had been elevated over the last couple of weeks and did  see her primary care physician who added lisinopril HCTZ to her  medication regimen, she was only talking Toprol XL 50 mg daily and was  well-controlled.  However, over the last few weeks she has noticed that  her pressure has been rising and did report this to her physician who  added the lisinopril.  The patient states that she had been feeling fine  with the exception of the elevated blood pressure until the last couple  of weeks with new onset of the chest discomfort today.  The patient does  state that she has the same kind of pain in her chest that she had  approximately 10 years ago when she may or may not have had an  intervention.   REVIEW OF SYSTEMS:  Positive for chest pain, shortness of breath,  palpitations, and heartburn like symptoms with low grade nausea.  No  vomiting.   PAST MEDICAL  HISTORY:  1. Hypertension.  2. Questionable CAD.   PAST SURGICAL HISTORY:  1. Bilateral tubal ligation.  2. Tonsillectomy.   SOCIAL HISTORY:  She lives in [place] with her fiance.  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  She is a 30-pack year smoker.  She drinks wine or  beer once a month and occasional marijuana.  She states she is under a  lot of stress at present.   FAMILY HISTORY:  Hypertension, diabetes in her mother.  Father died of  complications from diabetes.  She has one sister with anxiety and  another sister with lung disease.   CURRENT MEDICATIONS:  As an outpatient:  1. Toprol XL 50 mg daily.  2. Lisinopril HCTZ 20/12.5 once a day.  3. Aspirin 81 mg daily.   ALLERGIES:  NO KNOWN DRUG ALLERGIES.   CURRENT LABORATORY:  CK 172, MB 2.9, troponin less than 0.05.  Sodium  140,  potassium 3.6, chloride 107, CO2 36, BUN 10, creatinine 1.1,  glucose 85.  Hemoglobin 13.9, hematocrit 40.9, white blood cells 7.6,  platelets 270.  PTT 33, PT 13.7, INR 1.0.   PHYSICAL EXAMINATION:  VITAL SIGNS:  Blood pressure 168/109, pulse 80,  respirations 22, temperature 98.4, O2 sat 100% on 2 liters.  HEENT:  Head is normocephalic, atraumatic.  Eyes:  PERRLA.  Mucous  membranes mouth pink and moist.  Tongue is midline.  NECK:  Supple.  There is no JVD or carotid bruits on auscultation.  CARDIOVASCULAR:  Regular rate and rhythm with S4 murmur auscultated and  1/6 systolic murmur auscultated.  Pulse are 2+ and equal bilaterally  without bruit.  LUNGS:  Essentially are clear to auscultation with some wheezes,  inspiratory anteriorly.  ABDOMEN:  Soft, nontender with no rebound or guarding.  EXTREMITIES:  Without clubbing, cyanosis, or edema.  NEURO:  Cranial nerves II-XII are grossly intact.   X-RAYS:  Chest x-ray reveals stable with nothing acute.  EKG revealing  normal sinus rhythm with ventricular 78 beats per minute  with unmarked  hypertrophy.  She also has an ST depression in V4 and V5, which is new  from May 2007.   IMPRESSION:  1. Uncontrolled hypertension despite 2 medications.  2. Chest pain, questionable history of angioplasty in 1997 with      electrocardiogram changes.   PLAN:  We will admitted cycle cardiac biomarkers.  We will place on  nitroglycerin and titrate the pain.  I will add a calcium channel  blocker, Norvasc 5 mg p.o. daily.  We will do an echocardiogram for LV  function in the setting of hypertension and LVH.  Review records to  evaluate whether the patient has had a PCI.  In the interim the patient  will be scheduled for cardiac catheterization with evidence of ischemia  on EKG.   The patient's EKG was repeated by Dr. Daleen Squibb and has questionable pseudo  normalization inferiorly.  The patient has no known dye allergies.  Risks and benefits of cardiac catheterization have been discussed with  the patient who verbalizes understanding and is willing to proceed.  The  patient will be started on IV heparin, nitroglycerin, aspirin, PPI, and  amlodipine and we will monitor closely with cardiac catheterization  scheduled for May 30, 2007.      Bettey Mare. Lyman Bishop, NP      Jesse Sans. Daleen Squibb, MD, [facility]  Electronically Signed   KML/MEDQ  D:  05/29/2007  T:  05/30/2007  Job:  [id number]   cc:   Fanny Dance. Rankins, M.D.

## 2010-12-27 NOTE — Assessment & Plan Note (Signed)
OFFICE VISIT   Kimberly [name], Kimberly [name]  DOB:  21-Jan-1956                                        June 28, 2007  CHART #:  [id number]   CURRENT PROBLEMS:  1. Status post CABG times four, 06/03/2007 for severe multivessel      disease with unstable angina.  2. Swelling in the right leg following endo vein harvest with probable      hematoma in the endo vein tunnel.  3. History of substance abuse.  4. Hypertension.   PRESENT ILLNESS:  Kimberly [name] is a 55 year old black female who returns  for her first office visit after CABG times four for multivessel  coronary disease. She was discharged home on the fifth postoperative day  on Lopressor, lisinopril, Lipitor, thiamine, iron and aspirin 81 mg.  Since her return home she states she has stopped smoking and has not  been taking any drugs. She developed leg swelling and pain after  returning home and now walks with a cane. She was seen by Bevelyn Buckles.  Bensimhon, MD in his clinic last week and her Lipitor dose was modified.  Otherwise, she was felt to be doing well. She denies any angina or  symptoms of CHF and the surgical incisions in the chest are well healed.   PHYSICAL EXAMINATION:  Blood pressure 140/90, pulse 80, respirations 18,  saturation 100%. She is alert and oriented. Breath sounds are clear and  equal. The sternum was well healed and stable. The heart rhythm was  regular and there was no murmur or rub. The right leg has swelling below  the knee with probably hematoma in the endo vein tunnel. She is able to  raise and lower the foot. The foot is warm. The left leg is fine.   PA and lateral chest x-ray shows clear lung fields without pleural  effusion and the sternal wires are well aligned and the cardiac  silhouette is baseline.   IMPRESSION:  The patient is now one month postop and is recovering  fairly well except for her leg. It remains fairly tender and mild to  moderately edematous. She is  finishing a course of Lasix recommended by  Bevelyn Buckles. Bensimhon, MD. I am concerned over DVT and we will arrange for  her to have a duplex ultrasound of the right leg and I will see her back  in the office next week to review the results. Otherwise she was told to  remain off the leg and to keep it elevated as much as possible. She was  provided with one prescription for Ultram for leg and incisional pain.   Kerin Perna, M.D.  Electronically Signed   PV/MEDQ  D:  06/28/2007  T:  06/29/2007  Job:  [id number]   cc:   Bevelyn Buckles. Bensimhon, MD

## 2010-12-27 NOTE — Assessment & Plan Note (Signed)
[facility]                            CARDIOLOGY OFFICE NOTE   ALSHA, MELAND                      MRN:          [id number]  DATE:06/25/2007                            DOB:          1956-02-22    INTERVAL HISTORY:  Ms. Saperstein is a very pleasant 55 year old woman with a  history of hypertension, hyperlipidemia, and polysubstance abuse.  She  was recently admitted to [facility] last month.  Her drug screen was  positive for cocaine and marijuana.  She ended up ruling in for a non-ST-  elevation myocardial infarction.  I performed her heart catheterization  and she was found to have significant three-vessel coronary artery  disease with a normal LV function.  She subsequently underwent coronary  artery bypass grafting by Dr. Donata Clay.  She returns today for post  hospital followup.   Overall she is doing quite well.  She does have some chest wall pain but  no significant angina.  Her breathing is getting much better.  She is up  and around.  She tells me that she has stopped using drugs but has begun  to smoke cigarettes again.  She is having some problems with right foot  pain which sounds like plantar fasciitis.  Finally, she is having  headaches from her pravastatin at night when she takes her two tablets.   CURRENT MEDICATIONS:  1. Pravastatin 80 a day.  2. Lasix 40 a day.  3. Lisinopril 20 a day.  4. Thiamine 100 a day.  5. Potassium 20 a day.  6. Metoprolol 50 b.i.d.   PHYSICAL EXAMINATION:  She is in no acute distress.  Blood pressure is  108/82, heart rate is 82, weight is 131.  HEENT:  Normal.  NECK:  Supple.  There is no JVD.  Carotids are 2+ bilaterally without  any bruits.  There is no lymphadenopathy or thyromegaly.  CHEST WALL:  Her incision is well healed.  Sternum appears stable.  There is no evidence of infection.  CARDIAC:  PMI is nondisplaced.  She is regular with no murmurs or rubs.  She has an S4.  LUNGS:  Clear.  I  do not hear any effusions.  ABDOMEN:  Soft, nontender, nondistended.  There is no  hepatosplenomegaly, no bruits, no masses, good bowel sounds.  EXTREMITIES:  Warm with no cyanosis, clubbing or edema.  Her leg  incisions are healing well.  No rash.  NEUROLOGIC:  Alert and oriented x3.  Cranial nerves II-XII are intact.  Moves all four extremities without difficulty.  Affect is pleasant.   EKG shows sinus rhythm with biatrial enlargement.  She has diffuse T-  wave inversions.   ASSESSMENT AND PLAN:  1. Coronary artery disease status post bypass grafting.  She is doing      well.  Continue current therapy.  She will see Dr. Donata Clay on      Friday and then I have advised her to go to cardiac rehab.  2. Hypertension, well controlled.  3. Hyperlipidemia.  She is on Pravastatin 80.  I have asked her to cut  it down to 40 and see if this helps.  If she still has headaches we      can switch her over to lovastatin either 40 or 80.  Goal LDL is      less than 70.  4. Tobacco use.  Once again, will remind her of the risks and told her      do her best to try and quit smoking.   DISPOSITION:  She will come back to see [place] in 3 months and follow up  with her primary cardiologist, Dr. Daleen Squibb.     Bevelyn Buckles. Bensimhon, MD  Electronically Signed    DRB/MedQ  DD: 06/25/2007  DT: 06/25/2007  Job #: [id number]   cc:   Kerin Perna, M.D.  Turkey R. Rankins, M.D.

## 2010-12-27 NOTE — Discharge Summary (Signed)
NAMEKRISTINE, Kimberly [name] NO.:  [id number]   MEDICAL RECORD NO.:  [id number]          PATIENT TYPE:  INP   LOCATION:  3041                         FACILITY:  [facility]   PHYSICIAN:  Bevelyn Buckles. Champey, M.D.DATE OF BIRTH:  1956/06/19   DATE OF ADMISSION:  12/05/2007  DATE OF DISCHARGE:  12/08/2007                               DISCHARGE SUMMARY   PRIMARY DIAGNOSIS:  Stroke.   SECONDARY DIAGNOSES:  Include,  1. Cocaine abuse.  2. Coronary artery disease.  3. Hypertension.  4. Myocardial infarction/coronary artery disease.  5. High cholesterol.   DISCHARGE MEDICATIONS:  1. Toprol XL 100 mg daily.  2. Verapamil 240 mg daily.  3. Lisinopril 20 mg p.o. daily which is a substitute for the patient's      home Accupril.  4. Zocor 20 mg per day.  5. Plavix 75 mg per day.   DISCHARGE INSTRUCTIONS:  To follow up with Dr. Pearlean Brownie, neurology in 1-2  months, office number is [phone], and the patient is to return to her  primary care physician within the next month for followup and care.   HOSPITAL COURSE:  Please see admission H&P done by Dr. Vickey Huger.  The  patient had an acute stroke in the right thalamus and right posterior  internal capsule.  The patient did not meet criteria for TPA; however,  did meet criteria to enroll in the SENTIS study.  The patient was  admitted to the hospital to the Stroke Service.  The patient had an  uncomplicated hospital stay and actually showed slight improvement  throughout hospital course.  She was found to have a right PCA  occlusion, asymptomatic right common carotid artery occlusion, and a  left carotid stenosis.  Her aspirin was changed to Plavix.  Physical and  occupational therapy were ordered.  The patient had 2D echocardiogram  which showed an EF of 60%, cholesterol 129, with LDL of 70.  Homocysteine level was 7.4.  Tox screen showed positive marijuana and  propoxyphene.  Physical therapy recommended home health therapy.  The  patient was doing well on day of discharge and was stable on December 08, 2007, ready for discharge. Her NIH stroke scale on the day of admission  was 2.  We will arrange home physical therapy and occupational therapy  for the patient.  Prescriptions are written for the patient's  medications that she did not have.  She is told to call Dr. Marlis Edelson  office for any questions or concerns, [phone].      Bevelyn Buckles. Nash Shearer, M.D.  Electronically Signed    DRC/MEDQ  D:  12/08/2007  T:  12/08/2007  Job:  [id number]

## 2010-12-27 NOTE — Op Note (Signed)
NAMENEREIDA, Kimberly [name] NO.:  [id number]   MEDICAL RECORD NO.:  [id number]          PATIENT TYPE:  INP   LOCATION:  2016                         FACILITY:  [facility]   PHYSICIAN:  Kerin Perna, M.D.  DATE OF BIRTH:  1956-02-13   DATE OF PROCEDURE:  06/07/2007  DATE OF DISCHARGE:                               OPERATIVE REPORT   OPERATION:  Right chest tube placement.   PREOPERATIVE DIAGNOSIS:  Thirty percent right pneumothorax.   POSTOPERATIVE DIAGNOSIS:  Thirty percent right pneumothorax.   SURGEON:  Kerin Perna, M.D.   ANESTHESIA:  Local 1% lidocaine.   INDICATIONS:  The patient is a 55 year old female who recently underwent  coronary bypass grafting for severe three-vessel coronary disease and a  non-Q-wave MI.  A postoperative chest x-ray showed a right pneumothorax  which is increased at 30% today.  Associated with that, she has oxygen  dependence and some underlying chronic lung disease.  It is felt that  decompression of the pneumothorax would improve her pulmonary function.  I discussed the procedure with her and obtained an informed consent.   PROCEDURE:  The patient's right chest was prepped and draped as a  sterile field.  Using a sterile gown, glove and mask technique, a 10-  gauge pneumostat catheter was inserted into the right anterior chest in  the third interspace and connected to an underwater seal Pleur-Evac  drainage system.  The pneumostat catheter was secured to the skin with  silk suture, and sterile dressings were applied.  A portable chest x-ray  was ordered.      Kerin Perna, M.D.  Electronically Signed     PV/MEDQ  D:  06/07/2007  T:  06/08/2007  Job:  [id number]

## 2010-12-27 NOTE — Assessment & Plan Note (Signed)
OFFICE VISIT   Kimberly [name], Kimberly [name]  DOB:  01-Jul-1956                                        July 19, 2007  CHART #:  [id number]   CURRENT PROBLEMS:  1. Status post coronary artery bypass grafting x4 June 03, 2007 for      multi-vessel coronary disease with unstable angina.  2. Right leg swelling and hematoma in the EndoVein tunnel.  3. History of substance abuse.  4. Hypertension.   HISTORY OF PRESENT ILLNESS:  [name] returns for a wound check. Her  sternal and her leg wound now are doing very well. She has no difficulty  walking, and the swelling and the hematoma in the left have almost  completely resolved. The chest incision looks fine, and her breath  sounds are clear. She remains on her chronic medications including  aspirin, Lopressor, lisinopril, Lipitor, and pain medication.   PHYSICAL EXAMINATION:  Blood pressure 150/98, pulse 90, respirations 18,  saturating 98%. She is in good spirits and feels well. Cardiac rhythm is  regular, and the incisions look fine. There is no pedal edema.   PLAN:  The patient will continue her rehabilitation and return here as  needed. She was told that any cardiac medications that run out that will  need to be renewed through Dr. Prescott Gum office. No pain medications  were requested or prescribed at this office visit.   Kerin Perna, M.D.  Electronically Signed   PV/MEDQ  D:  07/19/2007  T:  07/20/2007  Job:  [id number]

## 2010-12-27 NOTE — Discharge Summary (Signed)
NAMESHADI, LARNER NO.:  [id number]   MEDICAL RECORD NO.:  [id number]          PATIENT TYPE:  INP   LOCATION:  2016                         FACILITY:  [facility]   PHYSICIAN:  Kerin Perna, M.D.  DATE OF BIRTH:  1956-02-28   DATE OF ADMISSION:  05/29/2007  DATE OF DISCHARGE:  06/10/2007                               DISCHARGE SUMMARY   FINAL DIAGNOSIS:  Severe three-vessel coronary artery disease with  unstable angina.   IN-HOSPITAL DIAGNOSES:  1. Postoperative right pneumothorax.  2. Postoperative right lung pneumonia.  3. Acute blood loss anemia postoperatively.  4. Volume overload postoperatively.   SECONDARY DIAGNOSES:  1. History of coronary artery disease, with history of previous mild      myocardial infarction dating back to 1997.  2. Hypertension.  3. Tobacco abuse.  4. Cocaine abuse.  5. Marijuana abuse.   IN-HOSPITAL OPERATIONS AND PROCEDURES:  1. Cardiac catheterization.  2. Coronary artery bypass grafting x4, using a left internal mammary      artery to left anterior descending, saphenous vein graft to      diagonal, saphenous vein graft to circumflex marginal, saphenous      vein graft to posterior descending.  Endoscopic vein harvesting      from right leg was done.  3. Status post right chest tube placement, 10-gauge Pneumostat      catheter.   HISTORY AND PHYSICAL AND HOSPITAL COURSE:  The patient is a 55 year old  hypertensive female who presented with unstable angina and had only mild  elevation of cardiac enzymes.  She was stabilized on heparin and  Integrilin and underwent cardiac catheterization which demonstrated  multivessel coronary artery disease with a fairly well-preserved LV  function.  Her 2D echocardiogram showed no significant valvular disease,  with some posterior hypokinesia and LVH.  She was felt to be a candidate  for surgical evaluation.  The patient was initially evaluated by Dr.  Dorris Fetch.  Dr. Dorris Fetch  discussed with the patient undergoing  coronary bypass grafting.  He discussed risks and benefits with the  patient.  The patient acknowledged understanding and agreed to proceed.  Dr. Dorris Fetch discussed with the patient that Dr. Donata Clay would be  doing her surgery.  She again agreed to proceed.  Preoperatively, the  patient remained stable.  Her surgery was scheduled for June 03, 2007.  For details of the patient's past medical history and physical  exam, please see the dictated H&P.   The patient was taken to the operating room on June 03, 2007, where  she underwent coronary artery bypass grafting x4, using a left internal  mammary artery to left anterior descending, saphenous vein graft to  diagonal, saphenous vein graft to circumflex marginal, saphenous vein  graft to posterior descending.  She had endoscopic vein harvesting from  her right lower leg done.  The patient tolerated this procedure well and  was transferred to the intensive care unit in stable condition.  Postoperatively, the patient was noted to be hemodynamically stable.  She was extubated the evening of surgery.  Post extubation, the patient  was noted  to be alert and oriented x4, neurologically intact.  Postop  day #1, in the intensive care unit, the patient's vital signs were noted  to be stable.  She was able to be weaned from all drips.  Swan-Ganz  catheter discontinued in the normal fashion.  Postoperative chest x-ray  was clear, with minimal drainage from chest tubes.  Chest tubes  discontinued in normal fashion.  The patient was noted to have slight  volume overload and was started on diuretics.  She was out of bed,  ambulating with assistance.  She was transferred out to the PCTU on  postop day #1.  While on the telemetry floor on postop day #2, the  patient desaturated post ambulation.  She did have a chest x-ray done  postop day #2 with noted 10% right pneumothorax.  The patient was placed  on 6  liters, with saturations maintaining 90 or greater.  Follow-up  chest x-ray postop day #2 showed slight increase in the right  pneumothorax of about 15%, with questionable right lower lobe pneumonia.  The patient was started on IV Fortaz at that time.  Repeat chest x-ray  day #3 showed persistent right lower lobe pneumonia, with right  pneumothorax maintaining 15%.  Swallow study was done postop day #3 for  evaluation of aspiration.  This showed to be essentially normal swallow  function.  The patient was continued on a regular diet.  Chest x-ray  obtained postop day #4 showed increase in her right pneumothorax.  It  was about 30%.  Due to the patient's increasing pneumothorax and  persistent requirement of nasal cannula at 5-6 liters to maintain  saturations of 90%, it was felt that a right chest tube was required.  Dr. Donata Clay discussed this with the patient.  The patient acknowledged  her understanding and agreed to proceed.  On June 07, 2007, postop  day #4, right chest tube was placed, 10-gauge Pneumostat catheter.  The  patient tolerated this well.  Follow-up chest x-ray showed resolution of  pneumothorax.  Over the next several days, chest x-rays were obtained.  Remained stable, with no pneumothorax.  No air leak noted.  She was  placed to water seal postop day #5.  By postop day #6, chest x-ray  remained stable.  She was able to be weaned off O2, with saturations  greater than 90%.  Chest tube was discontinued.  Chest x-ray June 10, 2007 showed no pneumothorax or pneumonia present.  Again, she was  saturating greater than 90% on room air.  She continued to use her  incentive spirometer, and she remained on IV Fortaz during her hospital  course.  The patient was noted to be afebrile on June 10, 2007.  Her  lungs were clear to auscultation bilaterally.  Postoperatively, the  patient also had slight acute blood loss anemia.  Her hemoglobin and  hematocrit dropped to 8.7  and 26.9 on postop day #3.  She was started  p.o. iron.  This was followed closely.  It did improve by postop day #4  to 9.2 and 28%.  The patient was asymptomatic, and this remained stable.  The patient was continued on diuretics postoperatively for volume  overload.  Daily weights were obtained.  The patient was back to her  baseline weight prior to discharge home.  The patient remained in normal  sinus rhythm postoperatively.  Her vital signs remained stable.  All  incisions were clean, dry, and intact and healing well.  The  patient  continued to ambulate well with cardiac rehab.  She was tolerating diet  well.  No nausea or vomiting noted.  On June 10, 2007, the patient  was noted to be afebrile.  She was saturating greater than 90% on room  air.  She was without complaints, ready to go home.  External pacing  wires and chest tube sutures have been discontinued in the normal  fashion.   The patient was felt to be stable for discharge home on June 10, 2007, postop day #7.   FOLLOW-UP APPOINTMENTS:  Follow-up appointment will be arranged with Dr.  Donata Clay for in 3 weeks.  Our office will contact the patient with this  information.  The patient will need to obtain PA and lateral chest x-ray  30 minutes prior to this appointment.  The patient will need to follow  up with Dr. Gala Romney in 2 weeks.  She will need to contact his office  to make these arrangements.   ACTIVITY:  Patient instructed no driving until released to do so.  No  heavy lifting over 10 pounds.  She is told to ambulate 3-4 times per  day, progress as tolerated, and to continue her breathing exercises.   INCISIONAL CARE:  The patient is told to shower, washing her incisions  using soap and water.  She is to contact the office if she develops any  drainage or opening from any of her incision sites.   DIET:  Patient educated on diet to be lowfat, low salt.   DISCHARGE MEDICATIONS:  1. Aspirin 81 mg daily.   2. Lopressor 50 mg b.i.d.  3. Lisinopril 20 mg daily.  4. Lipitor 80 mg at night.  5. Advair 250/50, 1 puff b.i.d.  6. Thiamine 100 mg daily.  7. Niferex 150 mg daily.  8. Oxycodone 5 mg 1-2 tabs q.4-6 h. p.r.n. pain.      Theda Belfast, [place]      Kerin Perna, M.D.  Electronically Signed    KMD/MEDQ  D:  06/10/2007  T:  06/10/2007  Job:  [id number]   cc:   Bevelyn Buckles. Bensimhon, MD

## 2010-12-27 NOTE — Assessment & Plan Note (Signed)
[facility]                            CARDIOLOGY OFFICE NOTE   Kimberly [name], TWADDELL                      MRN:          [id number]  DATE:10/10/2007                            DOB:          08-Jul-1956    Ms. [name] returns today for further manage her coronary artery disease,  hypertension, hyperlipidemia and history of tobacco use.  She continues  not to smoke.  She is now 4 months out for bypass surgery.  Other than  some chest wall incisional discomfort,  she is doing well.   Unfortunately, her blood pressure been up and down at rehab.  We have  made changes over the phone on numerous occasions.  In addition, we had  to deal with what they will pay for and what they will not.  It is  taking a lot of phone time and a lot of nursing time.   She is having no angina, ischemic symptoms.  She looks remarkably good  today.  For whatever reason, pravastatin has been dropped from her list  probably post discharge.   She is currently on metoprolol 50 mg p.o. b.i.d., aspirin 81 mg a day,  lisinopril 20 mg p.o. b.i.d. verapamil 240 mg a day.  They want to  switch her to Accupril which I have approved.  Also want to change of  metoprolol to Toprol.   Blood pressure is 128/86, her pulse 64 and regular.  Weight is 131.  HEENT:  Normocephalic, atraumatic.  PERRL.  Extraocular is intact.  Sclerae are clear.  Facial symmetry is normal.  Respirations 18.  Alert and oriented x3.  Carotids were equal bilateral bruits, no JVD.  Thyroid is not enlarged.  She is a thin lady chest incision is healed nicely.  There is no  significant tenderness or instability.  PMI is nondisplaced.  There is  no S4.  LUNGS:  Clear to auscultation.  Decreased breath sounds throughout.  ABDOMEN:  Soft, good bowel sounds.  No midline bruit.  EXTREMITIES:  No cyanosis, clubbing or edema.  Pulses are intact.  NEURO:  Exam is intact.   [name] is doing well.  I have made the following  change.  1. Approved metoprolol to Toprol change.  2. Approved lisinopril to be changed Accupril 20 mg p.o. b.i.d.  3. Renewed her pravastatin at 80 mg q.h.s.  I am not sure why this was      dropped.  4. Signed the permit for any handicapped parking permit.   Plan on seeing her back again in 3 months.     Thomas C. Daleen Squibb, MD, [facility]  Electronically Signed   TCW/MedQ  DD: 10/10/2007  DT: 10/11/2007  Job #: [id number]

## 2010-12-27 NOTE — H&P (Signed)
Kimberly [name], Kimberly [name] NO.:  [id number]   MEDICAL RECORD NO.:  [id number]          PATIENT TYPE:  INP   LOCATION:  3041                         FACILITY:  [facility]   PHYSICIAN:  Melvyn Novas, M.D.  DATE OF BIRTH:  09-20-1955   DATE OF ADMISSION:  12/05/2007  DATE OF DISCHARGE:                              HISTORY & PHYSICAL   Kimberly [name] is a 55 year old African American female with a history of  coronary artery disease who underwent a CABG earlier this year.  She had  previously a myocardial infarction back in 1997.  CVTS had consulted on  her in October 2008, and at that time a catheterization was recommended.  She has a history of hypertension, coronary disease, previous mild heart  attack, hypertension, tobacco abuse, cocaine abuse, and marijuana abuse.  The patient had a non-Q-wave myocardial infarction in October.  Yesterday, she states she felt fine.  This morning, she had an Ensure  breakfast, then try to do her dishes in the kitchen at around 10 to  10:30 when she noticed onset of left-sided weakness.  The patient states  there was no associated pain, no true numbness.  No dizziness.  She also  did notice any changes in her speech or ability to communicate.  However, she did not go to the hospital.  She arrived here at 1:00 p.m.  and code stroke was called through [facility] and not confirmed until  01:30.  The patient presented here with left-sided lower and upper  extremity weakness.  No clear facial droop, slight lower left face  numbness, which does not involve the lower extremities or upper  extremities or trunk.  The patient's motor symptoms were fluctuating and  hard to grade.   MEDICATIONS:  On admission were Toprol 50 mg daily, lisinopril,  hydrochlorothiazide 20/12.5.  She used to be on aspirin.  I suspect that  the patient should have started on Plavix after her surgery, but I have  no notes confirming that.  She also takes tramadol, pravastatin  at 40 mg  twice at night, Accupril 20 mg a day and Calan SR 240 mg daily.  She  endorse no allergies to medication.[name]   REVIEW OF SYSTEMS:  Nausea, vomiting, recent afebrile illness.  I asked  the patient if she is still actively smoking.  She states she has  stopped.  She could not tell me however how long ago.   FAMILY HISTORY:  Significant for hypertension and diabetes.   SOCIAL HISTORY:  Lives with a fiance.  She has lost her job.  She had in  the past denied cocaine use, but had tested positive for cocaine on a  tox screen twice after denying using it.  The tox screen right now is  still pending.   PHYSICAL EXAMINATION:  GENERAL:  She is a 55 year old African American  female that appears to be rather gritty and somewhat inappropriate in  her affect given the serious complain of a possible stroke.  VITAL SIGNS:  Blood pressure was 168/185, but it has also varied  greatly.  Heart rate is 160, appears regular.  There were no EKG changes  seen on today's EKG.  She has sinus rhythm with short PR interval.  LUNGS:  Clear to auscultation.  COR:  Regular rate and rhythm.  No carotid bruit.  No temporal artery  induration.  No goiter.  HEENT:  Further remarkable for poor dental status.  She has bilateral  equal eye closure strength.  Uvula midline.  EXTREMITIES:  Show no clubbing, cyanosis, or edema.  Peripheral pulses  and she is not having any recent wounds, rash, or bruising.  Peripheral  extremities show that she can lift her left upper extremities against  gravity, but then shows a pronator drift.  An hour ago, the patient has  not been able to move against gravity.  In her lower extremities, she is  still not able to maintain antigravity movement and the legs dropped.  She has some deep tendon reflexes that are symmetric throughout.  She  has a slight upgoing toe on the left and right and equivocal response on  the right.  Sensory to the body shows no primary modality loss.  No   extinction or neglect.  There was no arthralgia or dysarthria noted.  The patient could name objects on the NIH stroke scale, pictures, and  can read.  ABDOMEN:  Soft.  Her groin is symmetric.  NEUROLOGIC:  Cranial nerve examination, there is lower right-sided  facial numbness noted.  A inconsistent complaint of blurring on the  right visual field homonymously, however, the patient does not describe  a curtain phenomenon or scotoma phenomenon.   LABORATORY DATA:  I am still awaiting the tox screen for this patient..   I have discussed with the patient that according to her MRI scans  suffering an acute stroke which has involved the right thalamus, but  there is also a very tight stenosis on the left ICA, which the MRI could  appear embellished.  We will go to obtain an CT perfusion scan and then  go from there possibly for a catheter angiogram with Dr. Lilian Coma,  who was involved in the discussion.  The patient understands the  indications and also the survival benefits.  She understands the risk of  possible angiogram including death, stroke, bleeding, or possible need  for transfusion, renal or GI complication should the bleeding occur.  She understands and accepts these risks, agreed to proceed with a  possible procedure.  The patient will be admitted to the Stroke Service  and is up to the angiogram completed to be admitted to this 3100 floor  for overnight observation.      Melvyn Novas, M.D.  Electronically Signed     CD/MEDQ  D:  12/05/2007  T:  12/06/2007  Job:  [id number]   cc:   Pramod P. Pearlean Brownie, MD  Fanny Dance. Rankins, M.D.  Salvatore Decent Dorris Fetch, M.D.

## 2010-12-27 NOTE — Consult Note (Signed)
[name], [name] NO.:  [id number]   MEDICAL RECORD NO.:  [id number]          PATIENT TYPE:  INP   LOCATION:  3041                         FACILITY:  [facility]   PHYSICIAN:  Melvyn Novas, M.D.  DATE OF BIRTH:  [birth year]   DATE OF CONSULTATION:  DATE OF DISCHARGE:                                 CONSULTATION   This 55 year old African American right-handed female presented with a  new-onset left-sided weakness that she experienced while doing the  dishes and she described it at about 10:30 at her home.  She had taken a  can of Ensure for breakfast and had not had any solid meal today and she  noticed her left upper extremity becoming weaker, and perhaps even numb,  but she was not sure about the sensory loss.  She stood at the sink, she  noticed that her left leg also was, as she said, rubbery or wobbly.  The  symptoms improved, then again reoccurred and improved and they  reoccurred.  The patient did not present to the ER until 1:00 p.m. and  at that time in a private car.  Code stroke was called by Dr. Weldon Inches  and Dr. Rubin Payor, ER physicians.  The patient was not cognitively, in  anyway, appeared to describe her symptoms, and presented with right-  sided lower face numbness and left upper extremity and lower extremity  weakness.   PAST MEDICAL HISTORY:  The patient has a known past history of cocaine  abuse, coronary artery disease, hypertension and an MI in October last  year.  She is status post tubal ligation, tonsillectomy, and bypass for  unstable angina.   SOCIAL HISTORY:  The patient is apparently not been full employed.  Lives with her fiance.  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  She has a 30-year-pack history of smoking.  She denies  alcohol.  She has in the past used marijuana and cocaine.  The tox  screen at this time is still pending.   FAMILY HISTORY:  Positive for diabetes mellitus and hypertension.  Her  father  died of diabetes complications.  She has two sisters, 1 has an  anxiety disorder and the other has COPD.   REVIEW OF SYSTEMS:  The patient endorsed only that her left side was  weak.  She did not endorse any numbness.  She had a transient headache  at the time the symptoms set on, which is no longer present at the time  of the ER evaluation.  She denies nausea, vomiting, vertigo, pain,  burning dysesthesia, abdominal tenderness, and paraspinal tenderness.  No rash.  No bruising.   PHYSICAL EXAMINATION:  VITAL SIGNS: The patient presented to the ER with  an elevated blood pressure of 160/100, heart rate was in the high 60s  and regular.  Temperature was 98 degrees Fahrenheit and oxygenation was  100%.  NECK: There was no carotid bruit.  No goiter.  EXTREMITIES: No lower extremity edema, clubbing, cyanosis.  NEUROLOGIC: Tongue and uvula in midline.  No facial droop, but is alert  and oriented  x3.  Also, her affect was a little giddy and the patient  denies any recent cocaine use.  Mental status as above.  No ataxia or no  apraxia.  No arthralgia or dysarthria or dysphonia.  No __________  either.  Cranial nerve examination is intact with full extraocular  movements.  The patient states she has left-sided visual field blurring,  which is described as an homonymous finding and is still present at the  time of her presentation; however, it does not correlate with a gaze  preference until extraocular movements are conjugate.  She has no  ptosis, no facial asymmetry.  Again, tongue and uvula are midline.  Shoulder shrug on the left is intact.  Motor examination shows left 3/5  weakness in the upper extremities, lower extremity probably 2/5  weakness.  The patient has fairly anti-gravity movement.  She has an  upgoing equivocal toe on the left and a downgoing toe on the right.  Deep tendon reflexes are otherwise symmetric.  The patient has sensory  loss only over the right lower face.  Primary  modalities are intact in  both lower extremities and trunk.  She does not show signs of  extinction.  She had a finger-to-nose coordination difficulties that is  not out of proportion to her weakness, and I would not call this as  ataxia.   HOME MEDICATIONS:  The patient is at home on medications, Accupril,  acetazolamide, aspirin, lisinopril, hydrochlorothiazide, Toprol,  tramadol, and acetaminophen.  She also mentioned Calan.  She gets the  following doses of Toprol-XL 100 mg once a day, Calan SR 240 mg once a  day, tramadol 50 mg p.r.n., not more than once a day, pravastatin 40 mg  2 pills at night, Accupril 20 mg at night, and aspirin 325 mg and from  now on the patient is on 81 mg at home.   ALLERGIES:  No known drug allergies listed.   LABORATORY DATA:  An MRI showed a right thalamic stroke, visible  __________ cortical involvement.  White blood cells were 8.9,  hematocrit/hemoglobin 12.5/37.6, and platelet count 186,000.  SGOT is  elevated 64 and GPT at 148, bilirubin was 0.2 and normal, alkaline  phosphatase was 89.  Sodium 142, potassium 3.5, glucose 90, BUN 8,  creatinine 0.85.  Cardiac markers were negative.  Creatinine kinase 91,  CK-MB 1.5, and troponin 0.01.   ASSESSMENT:  The patient suffered an acute stroke but she arrived here  only at the very cusp of the 3-hour IV tPA window, and tPA was therefore  not given.  Her symptoms also were fluctuating reaching an NIH stroke  scale of only 5.  After the MRI was positive and discussion with Dr.  Corliss Skains and Dr. Pearlean Brownie as well as Dr. Karin Golden, we decided to follow up  with a CT perfusion study and angiography study that I  had originally  ordered was canceled.  The patient was enrolled in the medical branch of  the SENT[employer] trial.      Melvyn Novas, M.D.  Electronically Signed     CD/MEDQ  D:  12/05/2007  T:  12/06/2007  Job:  [id number]   cc:   Pramod P. Pearlean Brownie, MD  Fanny Dance. Rankins, M.D.

## 2010-12-27 NOTE — Assessment & Plan Note (Signed)
[facility]                            CARDIOLOGY OFFICE NOTE   BONNY, VANLEEUWEN                      MRN:          [id number]  DATE:09/16/2007                            DOB:          Oct 14, 1955    Ms. Vore comes in today for further management of the following issues.  1. Coronary artery disease.  She is status post coronary bypass      grafting x4 June 03, 2007 for multivessel coronary disease and      unstable angina.  2. History of substance abuse. She says she is clean except for an      occasionally cigarette.  3. Hypertension.  4. Hyperlipidemia.   Other than reduced appetite, she has no complaints.  She she weighs  identical to what she did on last visit in the office in November.   MEDICATIONS:  1. Pravastatin 80 mg q.h.s.  2. Lisinopril 20 mg a day.  3. Metoprolol 50 mg p.o. b.i.d.  4. Aspirin 81 mg a day.   Her blood pressure today is 158/117, pulse is 88 and regular.  She says  she is not out of her medications.  Her weight is 131.  HEENT:  Normocephalic, atraumatic.  PERRLA.  Extraocular movements  intact.  Sclerae muddy.  Facial symmetry is normal.  Carotids are full.  Thyroid is not enlarged.  Trachea is midline.  LUNGS:  Clear.  HEART:  Reveals a regular rate and rhythm with an S4, S2 splits. The  sternotomy site is stable.  ABDOMEN:  Soft.  EXTREMITIES:  Reveal no edema. Pulses are intact.  NEUROLOGIC:  Intact.   Ms. Harston is stable from our standpoint.  I have made no changes in her  medical program.  We will plan on seeing her back again in 6 months.     Thomas C. Daleen Squibb, MD, [facility]  Electronically Signed    TCW/MedQ  DD: 09/16/2007  DT: 09/17/2007  Job #: [id number]   cc:   Fanny Dance. Rankins, M.D.

## 2011-03-10 ENCOUNTER — Other Ambulatory Visit: Payer: Self-pay | Admitting: *Deleted

## 2011-03-10 MED ORDER — METOPROLOL SUCCINATE ER 100 MG PO TB24: ORAL_TABLET | ORAL | Status: DC

## 2011-04-14 ENCOUNTER — Other Ambulatory Visit (HOSPITAL_COMMUNITY): Payer: Self-pay | Admitting: Family Medicine

## 2011-04-14 DIAGNOSIS — Z1231 Encounter for screening mammogram for malignant neoplasm of breast: Secondary | ICD-10-CM

## 2011-04-26 ENCOUNTER — Other Ambulatory Visit: Payer: Self-pay | Admitting: *Deleted

## 2011-04-26 MED ORDER — QUINAPRIL HCL 20 MG PO TABS: 20.0000 mg | ORAL_TABLET | Freq: Two times a day (BID) | ORAL | Status: DC

## 2011-05-09 LAB — CBC
HCT: 39.7
Hemoglobin: 13.1
MCHC: 31.9
MCV: 70.4 — ABNORMAL LOW
MCV: 70.8 — ABNORMAL LOW
RBC: 5.32 — ABNORMAL HIGH
RBC: 5.34 — ABNORMAL HIGH
RBC: 5.61 — ABNORMAL HIGH
WBC: 8.9
WBC: 9.8

## 2011-05-09 LAB — DIFFERENTIAL
Basophils Relative: 1
Eosinophils Absolute: 0.1
Eosinophils Relative: 1
Lymphocytes Relative: 26
Lymphs Abs: 2.3
Monocytes Relative: 4
Monocytes Relative: 5
Neutro Abs: 6.1
Neutrophils Relative %: 69
Neutrophils Relative %: 69

## 2011-05-09 LAB — CK TOTAL AND CKMB (NOT AT ARMC): Relative Index: INVALID

## 2011-05-09 LAB — BASIC METABOLIC PANEL
Chloride: 103
GFR calc Af Amer: >60
Potassium: 3.5

## 2011-05-09 LAB — COMPREHENSIVE METABOLIC PANEL
Albumin: 3.9
Alkaline Phosphatase: 89
BUN: 8
Calcium: 9.7
Glucose, Bld: 90
Potassium: 3.5
Sodium: 142
Total Protein: 6.8

## 2011-05-09 LAB — PROTIME-INR
INR: 1.1
INR: 1.1
Prothrombin Time: 14.3
Prothrombin Time: 14.6

## 2011-05-09 LAB — URINE DRUGS OF ABUSE SCREEN W ALC, ROUTINE (REF LAB)
Barbiturate Quant, Ur: NEGATIVE
Cocaine Metabolites: NEGATIVE
Marijuana Metabolite: POSITIVE — AB
Methadone: NEGATIVE
Opiate Screen, Urine: NEGATIVE
Phencyclidine (PCP): NEGATIVE

## 2011-05-09 LAB — URINE MICROSCOPIC-ADD ON

## 2011-05-09 LAB — URINALYSIS, ROUTINE W REFLEX MICROSCOPIC
Glucose, UA: NEGATIVE
Hgb urine dipstick: NEGATIVE
Ketones, ur: NEGATIVE
Protein, ur: NEGATIVE

## 2011-05-09 LAB — HOMOCYSTEINE: Homocysteine: 7.4

## 2011-05-09 LAB — LIPID PANEL: HDL: 47

## 2011-05-09 LAB — PROPOXYPHENE, CONFIRMATION: Propoxyphene or Metab. GC/MS: 580 ng/mL

## 2011-05-09 LAB — HEMOGLOBIN A1C
Hgb A1c MFr Bld: 5.7
Mean Plasma Glucose: 126

## 2011-05-09 LAB — TROPONIN I: Troponin I: 0.01

## 2011-05-09 LAB — APTT
aPTT: 23 — ABNORMAL LOW
aPTT: 29

## 2011-05-09 LAB — THC (MARIJUANA), URINE, CONFIRMATION: Marijuana, Ur-Confirmation: 65 ng/mL

## 2011-05-19 ENCOUNTER — Other Ambulatory Visit: Payer: Self-pay | Admitting: *Deleted

## 2011-05-19 MED ORDER — QUINAPRIL HCL 20 MG PO TABS: 20.0000 mg | ORAL_TABLET | Freq: Two times a day (BID) | ORAL | Status: DC

## 2011-05-24 LAB — POCT I-STAT 3, ART BLOOD GAS (G3+)
Acid-base deficit: 1
Acid-base deficit: 2
Acid-base deficit: 4 — ABNORMAL HIGH
Bicarbonate: 23
Bicarbonate: 25 — ABNORMAL HIGH
O2 Saturation: 100
O2 Saturation: 95
Operator id: 121471
Operator id: 285121
Operator id: 285121
Operator id: 3291
Patient temperature: 36.9
Patient temperature: 36.9
TCO2: 25
TCO2: 26
TCO2: 27
pCO2 arterial: 32.6 — ABNORMAL LOW
pCO2 arterial: 36
pCO2 arterial: 36.6
pCO2 arterial: 40.7
pH, Arterial: 7.377
pH, Arterial: 7.377
pH, Arterial: 7.443 — ABNORMAL HIGH
pH, Arterial: 7.476 — ABNORMAL HIGH
pO2, Arterial: 230 — ABNORMAL HIGH
pO2, Arterial: 406 — ABNORMAL HIGH

## 2011-05-24 LAB — HEPARIN LEVEL (UNFRACTIONATED)
Heparin Unfractionated: 0.54
Heparin Unfractionated: 0.71 — ABNORMAL HIGH
Heparin Unfractionated: 0.93 — ABNORMAL HIGH

## 2011-05-24 LAB — CBC
HCT: 25.4 — ABNORMAL LOW
HCT: 25.9 — ABNORMAL LOW
HCT: 26 — ABNORMAL LOW
HCT: 26.9 — ABNORMAL LOW
HCT: 28 — ABNORMAL LOW
HCT: 28 — ABNORMAL LOW
HCT: 28.3 — ABNORMAL LOW
HCT: 29.1 — ABNORMAL LOW
HCT: 37.3
HCT: 39
HCT: 40
HCT: 40.9
Hemoglobin: 12
Hemoglobin: 12.6
Hemoglobin: 12.9
Hemoglobin: 13.3
Hemoglobin: 8.3 — ABNORMAL LOW
Hemoglobin: 8.4 — ABNORMAL LOW
Hemoglobin: 8.4 — ABNORMAL LOW
Hemoglobin: 8.7 — ABNORMAL LOW
Hemoglobin: 9.2 — ABNORMAL LOW
Hemoglobin: 9.3 — ABNORMAL LOW
Hemoglobin: 9.3 — ABNORMAL LOW
Hemoglobin: 9.4 — ABNORMAL LOW
MCHC: 32.2
MCHC: 32.2
MCHC: 32.2
MCHC: 32.2
MCHC: 32.3
MCHC: 32.3
MCHC: 32.3
MCHC: 32.4
MCHC: 32.7
MCHC: 33
MCHC: 33
MCV: 71 — ABNORMAL LOW
MCV: 71.3 — ABNORMAL LOW
MCV: 71.7 — ABNORMAL LOW
MCV: 71.7 — ABNORMAL LOW
MCV: 71.9 — ABNORMAL LOW
MCV: 72.1 — ABNORMAL LOW
MCV: 72.4 — ABNORMAL LOW
MCV: 72.8 — ABNORMAL LOW
MCV: 72.8 — ABNORMAL LOW
MCV: 73.2 — ABNORMAL LOW
MCV: 73.2 — ABNORMAL LOW
MCV: 73.3 — ABNORMAL LOW
MCV: 73.7 — ABNORMAL LOW
MCV: 74.3 — ABNORMAL LOW
Platelets: 117 — ABNORMAL LOW
Platelets: 124 — ABNORMAL LOW
Platelets: 141 — ABNORMAL LOW
Platelets: 146 — ABNORMAL LOW
Platelets: 147 — ABNORMAL LOW
Platelets: 208
Platelets: 224
Platelets: 230
Platelets: 253
Platelets: 271
RBC: 3.48 — ABNORMAL LOW
RBC: 3.53 — ABNORMAL LOW
RBC: 3.62 — ABNORMAL LOW
RBC: 3.82 — ABNORMAL LOW
RBC: 3.85 — ABNORMAL LOW
RBC: 3.9
RBC: 3.98
RBC: 5.24 — ABNORMAL HIGH
RBC: 5.44 — ABNORMAL HIGH
RBC: 5.61 — ABNORMAL HIGH
RBC: 5.67 — ABNORMAL HIGH
RBC: 5.75 — ABNORMAL HIGH
RDW: 15.5 — ABNORMAL HIGH
RDW: 15.6 — ABNORMAL HIGH
RDW: 15.6 — ABNORMAL HIGH
RDW: 15.8 — ABNORMAL HIGH
RDW: 15.9 — ABNORMAL HIGH
RDW: 16.2 — ABNORMAL HIGH
RDW: 16.3 — ABNORMAL HIGH
RDW: 16.3 — ABNORMAL HIGH
RDW: 16.5 — ABNORMAL HIGH
WBC: 11.3 — ABNORMAL HIGH
WBC: 13.1 — ABNORMAL HIGH
WBC: 13.6 — ABNORMAL HIGH
WBC: 13.6 — ABNORMAL HIGH
WBC: 14.5 — ABNORMAL HIGH
WBC: 14.9 — ABNORMAL HIGH
WBC: 7.6
WBC: 8.8
WBC: 9.2
WBC: 9.5
WBC: 9.5

## 2011-05-24 LAB — I-STAT 8, (EC8 V) (CONVERTED LAB)
BUN: 10
Chloride: 107
HCT: 47 — ABNORMAL HIGH
Hemoglobin: 16 — ABNORMAL HIGH
Operator id: 279831
Sodium: 140

## 2011-05-24 LAB — BASIC METABOLIC PANEL
BUN: 10
BUN: 11
BUN: 14
BUN: 7
BUN: 7
BUN: 8
BUN: 9
CO2: 24
CO2: 25
CO2: 25
CO2: 26
CO2: 26
CO2: 27
CO2: 29
Calcium: 8.8
Calcium: 8.9
Calcium: 9
Calcium: 9.1
Calcium: 9.2
Calcium: 9.7
Chloride: 101
Chloride: 103
Chloride: 103
Chloride: 103
Chloride: 105
Chloride: 107
Chloride: 111
Chloride: 111
Creatinine, Ser: 0.78
Creatinine, Ser: 0.81
Creatinine, Ser: 0.82
Creatinine, Ser: 0.83
Creatinine, Ser: 1.02
Creatinine, Ser: 1.03
GFR calc Af Amer: >60
GFR calc Af Amer: >60
GFR calc Af Amer: >60
GFR calc Af Amer: >60
GFR calc Af Amer: >60
GFR calc Af Amer: >60
GFR calc Af Amer: >60
GFR calc non Af Amer: 57 — ABNORMAL LOW
GFR calc non Af Amer: 57 — ABNORMAL LOW
GFR calc non Af Amer: >60
GFR calc non Af Amer: >60
GFR calc non Af Amer: >60
GFR calc non Af Amer: >60
GFR calc non Af Amer: >60
Glucose, Bld: 108 — ABNORMAL HIGH
Glucose, Bld: 119 — ABNORMAL HIGH
Glucose, Bld: 126 — ABNORMAL HIGH
Glucose, Bld: 128 — ABNORMAL HIGH
Glucose, Bld: 88
Glucose, Bld: 89
Glucose, Bld: 89
Glucose, Bld: 93
Potassium: 3.2 — ABNORMAL LOW
Potassium: 3.4 — ABNORMAL LOW
Potassium: 3.9
Potassium: 4
Potassium: 4
Potassium: 4
Potassium: 4.8
Sodium: 135
Sodium: 136
Sodium: 139
Sodium: 140
Sodium: 141
Sodium: 142
Sodium: 142

## 2011-05-24 LAB — POCT CARDIAC MARKERS: Myoglobin, poc: 172

## 2011-05-24 LAB — TYPE AND SCREEN
ABO/RH(D): O POS
Antibody Screen: NEGATIVE

## 2011-05-24 LAB — URINALYSIS, ROUTINE W REFLEX MICROSCOPIC
Bilirubin Urine: NEGATIVE
Bilirubin Urine: NEGATIVE
Glucose, UA: NEGATIVE
Hgb urine dipstick: NEGATIVE
Hgb urine dipstick: NEGATIVE
Ketones, ur: NEGATIVE
Nitrite: NEGATIVE
Nitrite: NEGATIVE
Protein, ur: NEGATIVE
Specific Gravity, Urine: 1.014
Specific Gravity, Urine: 1.017
Urobilinogen, UA: 0.2
pH: 5.5
pH: 8.5 — ABNORMAL HIGH

## 2011-05-24 LAB — BLOOD GAS, ARTERIAL
Acid-base deficit: 1.4
O2 Saturation: 98.1
TCO2: 23.5
pCO2 arterial: 35.1

## 2011-05-24 LAB — URINE CULTURE
Colony Count: NO GROWTH
Culture: NO GROWTH

## 2011-05-24 LAB — URINE MICROSCOPIC-ADD ON

## 2011-05-24 LAB — POCT I-STAT 4, (NA,K, GLUC, HGB,HCT)
Glucose, Bld: 100 — ABNORMAL HIGH
Glucose, Bld: 104 — ABNORMAL HIGH
Glucose, Bld: 125 — ABNORMAL HIGH
Glucose, Bld: 93
HCT: 24 — ABNORMAL LOW
HCT: 24 — ABNORMAL LOW
HCT: 35 — ABNORMAL LOW
Hemoglobin: 6.8 — CL
Hemoglobin: 8.2 — ABNORMAL LOW
Hemoglobin: 8.2 — ABNORMAL LOW
Operator id: 3291
Operator id: 3291
Operator id: 3291
Operator id: 3291
Operator id: 3291
Potassium: 3.9
Potassium: 3.9
Potassium: 4.4
Sodium: 135
Sodium: 137
Sodium: 138
Sodium: 138

## 2011-05-24 LAB — DIFFERENTIAL
Eosinophils Absolute: 0.1
Eosinophils Relative: 1
Lymphs Abs: 2.8
Monocytes Relative: 5
Neutrophils Relative %: 56

## 2011-05-24 LAB — PROTIME-INR
INR: 1.5
Prothrombin Time: 18.5 — ABNORMAL HIGH

## 2011-05-24 LAB — CARDIAC PANEL(CRET KIN+CKTOT+MB+TROPI): Troponin I: 9.6

## 2011-05-24 LAB — COMPREHENSIVE METABOLIC PANEL
BUN: 7
CO2: 27
Chloride: 103
Creatinine, Ser: 0.79
GFR calc non Af Amer: >60
Glucose, Bld: 83
Total Bilirubin: 0.9

## 2011-05-24 LAB — POCT I-STAT 3, VENOUS BLOOD GAS (G3P V)
TCO2: 22
pCO2, Ven: 39.3 — ABNORMAL LOW
pH, Ven: 7.337 — ABNORMAL HIGH
pO2, Ven: 51 — ABNORMAL HIGH

## 2011-05-24 LAB — LIPID PANEL
Cholesterol: 162
HDL: 61
LDL Cholesterol: 93
Total CHOL/HDL Ratio: 2.7
VLDL: 11

## 2011-05-24 LAB — CREATININE, SERUM
Creatinine, Ser: 0.89
GFR calc Af Amer: >60
GFR calc non Af Amer: >60

## 2011-05-24 LAB — I-STAT EC8
Acid-base deficit: 1
BUN: 6
Bicarbonate: 23.1
Glucose, Bld: 103 — ABNORMAL HIGH
Glucose, Bld: 82
Hemoglobin: 10.5 — ABNORMAL LOW
TCO2: 23
pCO2 arterial: 31.6 — ABNORMAL LOW
pCO2 arterial: 35.9
pH, Arterial: 7.45 — ABNORMAL HIGH

## 2011-05-24 LAB — TSH: TSH: 3.24

## 2011-05-24 LAB — POCT I-STAT CREATININE
Creatinine, Ser: 1.1
Operator id: 279831

## 2011-05-24 LAB — MAGNESIUM
Magnesium: 1.8
Magnesium: 2.9 — ABNORMAL HIGH

## 2011-05-24 LAB — RAPID URINE DRUG SCREEN, HOSP PERFORMED
Amphetamines: NOT DETECTED
Cocaine: POSITIVE — AB
Opiates: NOT DETECTED
Tetrahydrocannabinol: POSITIVE — AB

## 2011-05-24 LAB — ABO/RH: ABO/RH(D): O POS

## 2011-05-24 LAB — HEMOGLOBIN AND HEMATOCRIT, BLOOD: Hemoglobin: 6.8 — CL

## 2011-05-24 LAB — CK TOTAL AND CKMB (NOT AT ARMC)
CK, MB: 3.7
Total CK: 198 — ABNORMAL HIGH

## 2011-05-24 LAB — APTT: aPTT: 35

## 2011-05-24 LAB — PREPARE PLATELET PHERESIS

## 2011-05-24 LAB — PLATELET COUNT: Platelets: 241

## 2011-05-26 ENCOUNTER — Emergency Department (HOSPITAL_COMMUNITY)
Admission: EM | Admit: 2011-05-26 | Discharge: 2011-05-27 | Disposition: A | Discharge status: Home / Self Care | Payer: Self-pay | Attending: Emergency Medicine | Admitting: Emergency Medicine

## 2011-05-26 DIAGNOSIS — R300 Dysuria: Secondary | ICD-10-CM | POA: Insufficient documentation

## 2011-05-26 DIAGNOSIS — E7800 Pure hypercholesterolemia, unspecified: Secondary | ICD-10-CM | POA: Insufficient documentation

## 2011-05-26 DIAGNOSIS — I252 Old myocardial infarction: Secondary | ICD-10-CM | POA: Insufficient documentation

## 2011-05-26 DIAGNOSIS — N760 Acute vaginitis: Secondary | ICD-10-CM | POA: Insufficient documentation

## 2011-05-26 DIAGNOSIS — B9689 Other specified bacterial agents as the cause of diseases classified elsewhere: Secondary | ICD-10-CM | POA: Insufficient documentation

## 2011-05-26 DIAGNOSIS — Z79899 Other long term (current) drug therapy: Secondary | ICD-10-CM | POA: Insufficient documentation

## 2011-05-26 DIAGNOSIS — A499 Bacterial infection, unspecified: Secondary | ICD-10-CM | POA: Insufficient documentation

## 2011-05-26 DIAGNOSIS — I10 Essential (primary) hypertension: Secondary | ICD-10-CM | POA: Insufficient documentation

## 2011-05-26 DIAGNOSIS — R350 Frequency of micturition: Secondary | ICD-10-CM | POA: Insufficient documentation

## 2011-05-26 DIAGNOSIS — Z7982 Long term (current) use of aspirin: Secondary | ICD-10-CM | POA: Insufficient documentation

## 2011-05-26 DIAGNOSIS — N390 Urinary tract infection, site not specified: Secondary | ICD-10-CM | POA: Insufficient documentation

## 2011-05-26 DIAGNOSIS — L989 Disorder of the skin and subcutaneous tissue, unspecified: Secondary | ICD-10-CM | POA: Insufficient documentation

## 2011-05-26 DIAGNOSIS — I2510 Atherosclerotic heart disease of native coronary artery without angina pectoris: Secondary | ICD-10-CM | POA: Insufficient documentation

## 2011-05-26 DIAGNOSIS — R109 Unspecified abdominal pain: Secondary | ICD-10-CM | POA: Insufficient documentation

## 2011-05-27 LAB — WET PREP, GENITAL
Trich, Wet Prep: NONE SEEN
Yeast Wet Prep HPF POC: NONE SEEN

## 2011-05-27 LAB — URINALYSIS, ROUTINE W REFLEX MICROSCOPIC
Glucose, UA: NEGATIVE mg/dL
Ketones, ur: NEGATIVE mg/dL
Protein, ur: NEGATIVE mg/dL

## 2011-05-27 LAB — URINE MICROSCOPIC-ADD ON

## 2011-05-29 LAB — COMPREHENSIVE METABOLIC PANEL
ALT: 22
Albumin: 4
Alkaline Phosphatase: 72
GFR calc Af Amer: >60
Potassium: 4
Sodium: 141
Total Protein: 7.2

## 2011-05-29 LAB — CBC
Platelets: 269
RDW: 15.9 — ABNORMAL HIGH

## 2011-05-29 LAB — DIFFERENTIAL
Basophils Relative: 1
Eosinophils Absolute: 0.1
Lymphs Abs: 2.6
Monocytes Absolute: 0.3
Monocytes Relative: 5
Neutro Abs: 4.1

## 2011-05-29 LAB — POCT URINALYSIS DIP (DEVICE)
Bilirubin Urine: NEGATIVE
Nitrite: NEGATIVE
Protein, ur: NEGATIVE
Urobilinogen, UA: 0.2
pH: 7

## 2011-05-29 LAB — GC/CHLAMYDIA PROBE AMP, GENITAL: Chlamydia, DNA Probe: NEGATIVE

## 2011-06-15 ENCOUNTER — Other Ambulatory Visit: Payer: Self-pay | Admitting: *Deleted

## 2011-06-15 MED ORDER — QUINAPRIL HCL 20 MG PO TABS: 20.0000 mg | ORAL_TABLET | Freq: Two times a day (BID) | ORAL | Status: DC

## 2011-06-23 ENCOUNTER — Encounter: Payer: Self-pay | Admitting: Cardiology

## 2011-06-23 ENCOUNTER — Other Ambulatory Visit: Payer: Self-pay | Admitting: Internal Medicine

## 2011-06-23 DIAGNOSIS — M75100 Unspecified rotator cuff tear or rupture of unspecified shoulder, not specified as traumatic: Secondary | ICD-10-CM

## 2011-06-29 ENCOUNTER — Inpatient Hospital Stay (HOSPITAL_COMMUNITY): Admission: RE | Admit: 2011-06-29 | Payer: Self-pay | Source: Ambulatory Visit

## 2011-07-04 ENCOUNTER — Ambulatory Visit (HOSPITAL_COMMUNITY)
Admission: RE | Admit: 2011-07-04 | Discharge: 2011-07-04 | Disposition: A | Discharge status: Home / Self Care | Payer: Self-pay | Source: Ambulatory Visit | Attending: Internal Medicine | Admitting: Internal Medicine

## 2011-07-04 DIAGNOSIS — IMO0002 Reserved for concepts with insufficient information to code with codable children: Secondary | ICD-10-CM | POA: Insufficient documentation

## 2011-07-04 DIAGNOSIS — M75100 Unspecified rotator cuff tear or rupture of unspecified shoulder, not specified as traumatic: Secondary | ICD-10-CM

## 2011-07-04 DIAGNOSIS — M25519 Pain in unspecified shoulder: Secondary | ICD-10-CM | POA: Insufficient documentation

## 2011-07-04 DIAGNOSIS — X58XXXA Exposure to other specified factors, initial encounter: Secondary | ICD-10-CM | POA: Insufficient documentation

## 2011-07-04 DIAGNOSIS — S43429A Sprain of unspecified rotator cuff capsule, initial encounter: Secondary | ICD-10-CM | POA: Insufficient documentation

## 2011-07-07 ENCOUNTER — Encounter: Payer: Self-pay | Admitting: Cardiology

## 2011-07-07 ENCOUNTER — Telehealth: Payer: Self-pay | Admitting: Cardiology

## 2011-07-07 NOTE — Telephone Encounter (Signed)
Fu call °Pt returning your call  °

## 2011-07-07 NOTE — Telephone Encounter (Signed)
Patient called, she was returning Kimberly [name]'s phone call from yesterday.Patient was told office closed yesterday. She states she is doing good no problems appointment 11/27.Advised to keep appointment.

## 2011-07-10 ENCOUNTER — Ambulatory Visit (HOSPITAL_COMMUNITY)
Admission: RE | Admit: 2011-07-10 | Discharge: 2011-07-10 | Disposition: A | Discharge status: Home / Self Care | Payer: Self-pay | Source: Ambulatory Visit | Attending: Family Medicine | Admitting: Family Medicine

## 2011-07-10 DIAGNOSIS — Z1231 Encounter for screening mammogram for malignant neoplasm of breast: Secondary | ICD-10-CM | POA: Insufficient documentation

## 2011-07-11 ENCOUNTER — Telehealth: Payer: Self-pay | Admitting: Cardiology

## 2011-07-11 ENCOUNTER — Encounter: Payer: Self-pay | Admitting: Cardiology

## 2011-07-11 ENCOUNTER — Ambulatory Visit (INDEPENDENT_AMBULATORY_CARE_PROVIDER_SITE_OTHER): Payer: Self-pay | Admitting: Cardiology

## 2011-07-11 VITALS — BP 141/90 | HR 65 | Ht 65.0 in | Wt 133.0 lb

## 2011-07-11 DIAGNOSIS — I25810 Atherosclerosis of coronary artery bypass graft(s) without angina pectoris: Secondary | ICD-10-CM

## 2011-07-11 DIAGNOSIS — E785 Hyperlipidemia, unspecified: Secondary | ICD-10-CM

## 2011-07-11 DIAGNOSIS — I10 Essential (primary) hypertension: Secondary | ICD-10-CM

## 2011-07-11 DIAGNOSIS — I6350 Cerebral infarction due to unspecified occlusion or stenosis of unspecified cerebral artery: Secondary | ICD-10-CM

## 2011-07-11 DIAGNOSIS — Z87891 Personal history of nicotine dependence: Secondary | ICD-10-CM

## 2011-07-11 MED ORDER — QUINAPRIL HCL 40 MG PO TABS: 40.0000 mg | ORAL_TABLET | Freq: Every day | ORAL | Status: DC

## 2011-07-11 NOTE — Telephone Encounter (Signed)
Pt Dropped off [facility] [employer] Statement Ability to do work Related Activities, Wall Completed and Signed Mailed Original to pt/scanned a copy into Epic  07/11/11/km

## 2011-07-11 NOTE — Assessment & Plan Note (Signed)
Not under optimal control. I've asked her to take her Accupril 40 mg each morning as opposed to 20 mg twice a day. No other changes. Reinforced 2 g sodium diet

## 2011-07-11 NOTE — Progress Notes (Signed)
Patient ID: Kimberly [name], female   DOB: 01/16/1956, 55 y.o.   MRN: [id number]   HPI Kimberly [name] returns today for evaluation and management of her coronary disease and history of bypass surgery.  She's having no angina or ischemic symptoms. He still walks daily. Stress Myoview June 30, 2010 showed an ejection fraction 60% with no ischemia, lateral [name] infarct.  She has stopped smoking now for one year. 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  She is compliant with her medications though she's taking Accupril 20 mg twice a day instead of 40 mg once a day. She continues to be followed at [facility].  Past Medical History  Diagnosis Date  . Coronary artery disease 05/2007    cabgx4   . Stroke   . Hyperlipidemia   . Hypertension   . Rotator cuff syndrome of left shoulder   . Hammer toe   . Other abnormality of red blood cells   . Bicipital tenosynovitis   . Family history of ischemic heart disease   . Hip, thigh, leg, and ankle, insect bite, nonvenomous, without mention of infection     Current Outpatient Prescriptions  Medication Sig Dispense Refill  . aspirin 81 MG tablet Take 81 mg by mouth daily.        . clopidogrel (PLAVIX) 75 MG tablet Take 75 mg by mouth daily.        [name] HYDROcodone-acetaminophen (VICODIN) 5-500 MG per tablet Take 1 tablet by mouth every 6 (six) hours as needed.        . metoprolol (TOPROL-XL) 100 MG 24 hr tablet Take 1 and 1/2 tablets by mouth each morning  200 tablet  3  . Multiple Vitamins-Minerals (MULTIVITAMIN WITH MINERALS) tablet Take 1 tablet by mouth daily.        . quinapril (ACCUPRIL) 20 MG tablet Take 1 tablet (20 mg total) by mouth 2 (two) times daily.  60 tablet  6  . verapamil (CALAN-SR) 240 MG CR tablet Take 240 mg by mouth at bedtime.          No Known Allergies  Family History  Problem Relation Age of Onset  . Hypertension Mother   . Heart attack Father   . Diabetes Father   . Anxiety disorder Sister   . Sarcoidosis  Sister     History   Social History  . Marital Status: Single    Spouse Name: N/A    Number of Children: N/A  . Years of Education: N/A   Occupational History  . Not on file.   Social History Main Topics  . Smoking status: Former Games developer  . Smokeless tobacco: Former Neurosurgeon    Quit date: 08/09/2008  . Alcohol Use: 0.6 oz/week    1 Cans of beer per week  . Drug Use: Not on file  . Sexually Active: Not on file   Other Topics Concern  . Not on file   Social History Narrative  . No narrative on file    ROS ALL NEGATIVE EXCEPT THOSE NOTED IN HPI  PE  General Appearance: well developed, well nourished in no acute distress HEENT: symmetrical face, PERRLA, good dentition  Neck: no JVD, thyromegaly, or adenopathy, trachea midline Chest: symmetric without deformity Cardiac: PMI non-displaced, RRR, normal S1, S2, no gallop or murmur Lung: clear to ausculation and percussion Vascular: all pulses full without bruits  Abdominal: nondistended, nontender, good bowel sounds, no HSM, no bruits Extremities: no cyanosis, clubbing or edema, no sign of DVT, no varicosities  Skin: normal color, no rashes Neuro: alert and oriented x 3, Left side weakness Pysch: normal affect  EKG Normal sinus rhythm, LVH with strain, no acute changes. BMET    Component Value Date/Time   NA 141 02/23/2009 1735   K 4.1 02/23/2009 1735   CL 104 02/23/2009 1735   CO2 28 02/23/2009 1735   GLUCOSE 93 02/23/2009 1735   BUN 13 02/23/2009 1735   CREATININE 0.81 02/23/2009 1735   CALCIUM 10.3 02/23/2009 1735   GFRNONAA >60 02/23/2009 1735   GFRAA  Value: >60        The eGFR has been calculated using the MDRD equation. This calculation has not been validated in all clinical situations. eGFR's persistently <60 mL/min signify possible Chronic Kidney Disease. 02/23/2009 1735    Lipid Panel     Component Value Date/Time   CHOL 136 11/24/2008 2125   TRIG 131 11/24/2008 2125   HDL 51 11/24/2008 2125   CHOLHDL 2.7 Ratio  11/24/2008 2125   VLDL 26 11/24/2008 2125   LDLCALC 59 11/24/2008 2125    CBC    Component Value Date/Time   WBC 8.9 02/23/2009 1735   RBC 6.08* 02/23/2009 1735   HGB 14.3 02/23/2009 1735   HCT 44.0 02/23/2009 1735   PLT 260 02/23/2009 1735   MCV 72.4* 02/23/2009 1735   MCHC 32.6 02/23/2009 1735   RDW 15.4 02/23/2009 1735   LYMPHSABS 3.3 02/23/2009 1735   MONOABS 0.6 02/23/2009 1735   EOSABS 0.1 02/23/2009 1735   BASOSABS 0.0 02/23/2009 1735

## 2011-07-11 NOTE — Assessment & Plan Note (Signed)
Stable with no symptoms of ischemia or angina. Stress Myoview last year or stable. Continue secondary prevention and aggressive blood pressure control.

## 2011-07-11 NOTE — Assessment & Plan Note (Signed)
She has residual effects which may qualify her for disability. As I told her today, she is not disabled from her heart. I filled out the disability papers requesting her attorneys.

## 2011-07-11 NOTE — Patient Instructions (Addendum)
Your physician wants you to follow-up in: 1 year with Dr. Daleen Squibb. You will receive a reminder letter in the mail two months in advance. If you don't receive a letter, please call our office to schedule the follow-up appointment.  Your physician has recommended you make the following change in your medication:  Increase Accupril

## 2011-09-14 ENCOUNTER — Other Ambulatory Visit: Payer: Self-pay | Admitting: *Deleted

## 2011-09-14 MED ORDER — CLOPIDOGREL BISULFATE 75 MG PO TABS: 75.0000 mg | ORAL_TABLET | Freq: Every day | ORAL | Status: DC

## 2011-11-30 ENCOUNTER — Other Ambulatory Visit: Payer: Self-pay | Admitting: Cardiology

## 2011-11-30 MED ORDER — VERAPAMIL HCL ER 240 MG PO TBCR: 240.0000 mg | EXTENDED_RELEASE_TABLET | Freq: Every day | ORAL | Status: DC

## 2011-12-01 ENCOUNTER — Telehealth: Payer: Self-pay | Admitting: Cardiology

## 2011-12-01 NOTE — Telephone Encounter (Signed)
New Problem:    Patient called in wanting to switch all of her medications over to [facility] on [address] from now on.  Please call back if you have any question.

## 2012-02-09 ENCOUNTER — Other Ambulatory Visit: Payer: Self-pay | Admitting: Family Medicine

## 2012-02-09 ENCOUNTER — Ambulatory Visit
Admission: RE | Admit: 2012-02-09 | Discharge: 2012-02-09 | Disposition: A | Discharge status: Home / Self Care | Payer: Medicaid Other | Source: Ambulatory Visit | Attending: Family Medicine | Admitting: Family Medicine

## 2012-02-09 DIAGNOSIS — R52 Pain, unspecified: Secondary | ICD-10-CM

## 2012-02-09 DIAGNOSIS — R609 Edema, unspecified: Secondary | ICD-10-CM

## 2012-03-04 ENCOUNTER — Telehealth: Payer: Self-pay | Admitting: Physician Assistant

## 2012-03-04 NOTE — Telephone Encounter (Signed)
Patient called upset this evening at 7pm regarding her medications. She reports calling our office over a month ago regarding switching her medication from [facility] to [facility]. Has phone note from 11/2011. However, when she called [facility] they had no record of her medications being switched. She requested the name of who she spoke to. Will forward this for review. She has about a week's left of medications so is not acutely out of medications. I instructed her to call back during business hours tomorrow to request formal transfer of her medications to make sure we have everything correct. She will do so. She verbalized gratitude. Aditri Louischarles PA-C

## 2012-03-05 ENCOUNTER — Telehealth: Payer: Self-pay | Admitting: Cardiology

## 2012-03-05 DIAGNOSIS — I10 Essential (primary) hypertension: Secondary | ICD-10-CM

## 2012-03-05 MED ORDER — QUINAPRIL HCL 40 MG PO TABS: 40.0000 mg | ORAL_TABLET | Freq: Every day | ORAL | Status: DC

## 2012-03-05 MED ORDER — METOPROLOL SUCCINATE ER 100 MG PO TB24: ORAL_TABLET | ORAL | Status: DC

## 2012-03-05 MED ORDER — CLOPIDOGREL BISULFATE 75 MG PO TABS: 75.0000 mg | ORAL_TABLET | Freq: Every day | ORAL | Status: DC

## 2012-03-05 MED ORDER — VERAPAMIL HCL ER 240 MG PO TBCR: 240.0000 mg | EXTENDED_RELEASE_TABLET | Freq: Every day | ORAL | Status: DC

## 2012-03-05 NOTE — Telephone Encounter (Signed)
Pt calls b/c her cardiac medications need to be refilled at [facility] on [address] Reassured pt. She is not out of her medication. Refills sent in today. Mylo Red RN

## 2012-03-05 NOTE — Telephone Encounter (Signed)
Please return call to patient at [phone], to discuss medication dosages.

## 2012-06-03 ENCOUNTER — Other Ambulatory Visit (HOSPITAL_COMMUNITY): Payer: Self-pay | Admitting: *Deleted

## 2012-06-03 ENCOUNTER — Telehealth: Payer: Self-pay | Admitting: Cardiology

## 2012-06-03 ENCOUNTER — Other Ambulatory Visit (HOSPITAL_COMMUNITY): Payer: Self-pay | Admitting: Internal Medicine

## 2012-06-03 NOTE — Telephone Encounter (Signed)
Spoke with pt. Pt was seen at [facility] for primary care. Pt is having a hard time finding new PCP due to transportation and circumstances. [facility] will not schedule mammogram until pt gives them a name of MD to send report. Pt asking if report can be sent to Dr Daleen Squibb. I will forward to Dr Daleen Squibb for review and recommendations.

## 2012-06-03 NOTE — Telephone Encounter (Signed)
New problem:  Patient calling to see if she can send her mammogram results to Dr. [facility]ibb [facility] to [facility] until she can forward her result to an MD.

## 2012-06-10 ENCOUNTER — Other Ambulatory Visit: Payer: Self-pay | Admitting: Cardiology

## 2012-06-10 DIAGNOSIS — Z1231 Encounter for screening mammogram for malignant neoplasm of breast: Secondary | ICD-10-CM

## 2012-06-10 NOTE — Telephone Encounter (Signed)
Pt advised,verbalized understanding. 

## 2012-06-10 NOTE — Telephone Encounter (Signed)
Sure

## 2012-07-10 ENCOUNTER — Ambulatory Visit (HOSPITAL_COMMUNITY)
Admission: RE | Admit: 2012-07-10 | Discharge: 2012-07-10 | Disposition: A | Discharge status: Home / Self Care | Payer: Medicaid Other | Source: Ambulatory Visit | Attending: Cardiology | Admitting: Cardiology

## 2012-07-10 DIAGNOSIS — Z1231 Encounter for screening mammogram for malignant neoplasm of breast: Secondary | ICD-10-CM | POA: Insufficient documentation

## 2012-07-16 ENCOUNTER — Encounter: Payer: Self-pay | Admitting: *Deleted

## 2012-07-17 ENCOUNTER — Encounter: Payer: Self-pay | Admitting: Cardiology

## 2012-07-17 ENCOUNTER — Ambulatory Visit (INDEPENDENT_AMBULATORY_CARE_PROVIDER_SITE_OTHER): Payer: Medicaid Other | Admitting: Cardiology

## 2012-07-17 VITALS — BP 154/92 | HR 57 | Ht 65.0 in | Wt 132.0 lb

## 2012-07-17 DIAGNOSIS — E785 Hyperlipidemia, unspecified: Secondary | ICD-10-CM

## 2012-07-17 DIAGNOSIS — I10 Essential (primary) hypertension: Secondary | ICD-10-CM

## 2012-07-17 DIAGNOSIS — I25810 Atherosclerosis of coronary artery bypass graft(s) without angina pectoris: Secondary | ICD-10-CM

## 2012-07-17 MED ORDER — ROSUVASTATIN CALCIUM 10 MG PO TABS: ORAL_TABLET | ORAL | Status: DC

## 2012-07-17 NOTE — Assessment & Plan Note (Signed)
Rechecked her pressure. It was 154/90. She says she is taking her medications. We'll not add any additional medications because of expense. I've asked her to followup closely with her blood pressure with goal being less than 140/90.

## 2012-07-17 NOTE — Assessment & Plan Note (Signed)
Doing well and asymptomatic. I've restarted her on samples of Crestor. We'll have her take 10 mg every other day until the new clinic opens in March. I've told her not to have this refilled because of its expense. She understands. Continue secondary preventative therapy otherwise.

## 2012-07-17 NOTE — Progress Notes (Signed)
HPI Kimberly [name] returns today for evaluation and management history of coronary artery disease, hypertension, hyperlipidemia.  She's been having a lot of trouble getting medical services since [facility] closed. Signed for a mammogram which showed no evidence of malignancy. She is not taking her statin because of financial reasons. She is taking her other medications.  She's having no angina or ischemic symptoms. She is compliant with her medications. She continues not to smoke or drink alcohol. She stopped both of these when she had bypass surgery in 2008. Last ischemic assessment was 2011 and showed no ischemia with preserved LV function, lateral Alvera Tourigny infarct.  Past Medical History  Diagnosis Date  . Coronary artery disease 05/2007    cabgx4   . Stroke   . Hyperlipidemia   . Hypertension   . Rotator cuff syndrome of left shoulder   . Hammer toe   . Other abnormality of red blood cells   . Bicipital tenosynovitis   . Family history of ischemic heart disease   . Hip, thigh, leg, and ankle, insect bite, nonvenomous, without mention of infection(916.4)     Current Outpatient Prescriptions  Medication Sig Dispense Refill  . aspirin 81 MG tablet Take 81 mg by mouth daily.        . clopidogrel (PLAVIX) 75 MG tablet Take 1 tablet (75 mg total) by mouth daily.  90 tablet  3  . HYDROcodone-acetaminophen (VICODIN) 5-500 MG per tablet Take 1 tablet by mouth every 6 (six) hours as needed.        . metoprolol succinate (TOPROL-XL) 100 MG 24 hr tablet Take 1 and 1/2 tablets by mouth each morning  137 tablet  3  . Multiple Vitamins-Minerals (MULTIVITAMIN WITH MINERALS) tablet Take 1 tablet by mouth daily.        . quinapril (ACCUPRIL) 40 MG tablet Take 1 tablet (40 mg total) by mouth daily.  90 tablet  3  . verapamil (CALAN-SR) 240 MG CR tablet Take 1 tablet (240 mg total) by mouth daily.  90 tablet  3    No Known Allergies  Family History  Problem Relation Age of Onset  . Hypertension Mother    . Heart attack Father   . Diabetes Father   . Anxiety disorder Sister   . Sarcoidosis Sister     History   Social History  . Marital Status: Single    Spouse Name: N/A    Number of Children: N/A  . Years of Education: N/A   Occupational History  . Not on file.   Social History Main Topics  . Smoking status: Former Games developer  . Smokeless tobacco: Former Neurosurgeon    Quit date: 08/09/2008  . Alcohol Use: 0.6 oz/week    1 Cans of beer per week  . Drug Use: Not on file  . Sexually Active: Not on file   Other Topics Concern  . Not on file   Social History Narrative  . No narrative on file    ROS ALL NEGATIVE EXCEPT THOSE NOTED IN HPI  PE  General Appearance: well developed, well nourished in no acute distress HEENT: symmetrical face, PERRLA, good dentition  Neck: no JVD, thyromegaly, or adenopathy, trachea midline Chest: symmetric without deformity Cardiac: PMI non-displaced, RRR, normal S1, S2, no gallop or murmur Lung: clear to ausculation and percussion Vascular: all pulses full without bruits  Abdominal: nondistended, nontender, good bowel sounds, no HSM, no bruits Extremities: no cyanosis, clubbing or edema, no sign of DVT, no varicosities  Skin: normal  color, no rashes Neuro: alert and oriented x 3, non-focal Pysch: normal affect  EKG part  Sinus bradycardia, LVH with significant strain pattern, no change from previous ECG.  BMET    Component Value Date/Time   NA 141 02/23/2009 1735   K 4.1 02/23/2009 1735   CL 104 02/23/2009 1735   CO2 28 02/23/2009 1735   GLUCOSE 93 02/23/2009 1735   BUN 13 02/23/2009 1735   CREATININE 0.81 02/23/2009 1735   CALCIUM 10.3 02/23/2009 1735   GFRNONAA >60 02/23/2009 1735   GFRAA  Value: >60        The eGFR has been calculated using the MDRD equation. This calculation has not been validated in all clinical situations. eGFR's persistently <60 mL/min signify possible Chronic Kidney Disease. 02/23/2009 1735    Lipid Panel      Component Value Date/Time   CHOL 136 11/24/2008 2125   TRIG 131 11/24/2008 2125   HDL 51 11/24/2008 2125   CHOLHDL 2.7 Ratio 11/24/2008 2125   VLDL 26 11/24/2008 2125   LDLCALC 59 11/24/2008 2125    CBC    Component Value Date/Time   WBC 8.9 02/23/2009 1735   RBC 6.08* 02/23/2009 1735   HGB 14.3 02/23/2009 1735   HCT 44.0 02/23/2009 1735   PLT 260 02/23/2009 1735   MCV 72.4* 02/23/2009 1735   MCHC 32.6 02/23/2009 1735   RDW 15.4 02/23/2009 1735   LYMPHSABS 3.3 02/23/2009 1735   MONOABS 0.6 02/23/2009 1735   EOSABS 0.1 02/23/2009 1735   BASOSABS 0.0 02/23/2009 1735

## 2012-07-17 NOTE — Patient Instructions (Addendum)
Start taking Crestor 10mg  at bedtime every other day  Your physician wants you to follow-up in: 1 year with Dr. Daleen Squibb. You will receive a reminder letter in the mail two months in advance. If you don't receive a letter, please call our office to schedule the follow-up appointment.  Your physician has requested that you regularly monitor and record your blood pressure readings at home. Please use the same machine at the same time of day to check your readings and record them to bring to your follow-up visit.  Goal blood pressure is less than 140/90

## 2012-10-02 ENCOUNTER — Telehealth: Payer: Self-pay | Admitting: Cardiology

## 2012-10-02 NOTE — Telephone Encounter (Signed)
Follow Up   Pt fiance Nevin Bloodgood) will be picking up the Crestor samples for her today.

## 2012-10-02 NOTE — Telephone Encounter (Signed)
Pt aware samples of Crestor at desk this am. Her fiance Nevin Bloodgood will be picking those up for her as she is not feeling well today. Mylo Red RN

## 2012-10-02 NOTE — Telephone Encounter (Signed)
New problem    Sample of crestor 10 mg

## 2013-01-23 ENCOUNTER — Telehealth: Payer: Self-pay | Admitting: Cardiology

## 2013-01-23 NOTE — Telephone Encounter (Deleted)
error 

## 2013-01-23 NOTE — Telephone Encounter (Signed)
Samples placed at front desk. Pt notified.  

## 2013-01-23 NOTE — Telephone Encounter (Signed)
New Prob     Pt requesting some samples of CRESTOR.

## 2013-06-01 ENCOUNTER — Other Ambulatory Visit: Payer: Self-pay | Admitting: Cardiology

## 2013-06-02 ENCOUNTER — Other Ambulatory Visit: Payer: Self-pay

## 2013-06-02 MED ORDER — CLOPIDOGREL BISULFATE 75 MG PO TABS: 75.0000 mg | ORAL_TABLET | Freq: Every day | ORAL | Status: DC

## 2013-06-10 ENCOUNTER — Other Ambulatory Visit: Payer: Self-pay

## 2013-06-10 MED ORDER — CLOPIDOGREL BISULFATE 75 MG PO TABS: 75.0000 mg | ORAL_TABLET | Freq: Every day | ORAL | Status: DC

## 2013-06-26 ENCOUNTER — Other Ambulatory Visit: Payer: Self-pay

## 2013-06-26 DIAGNOSIS — I10 Essential (primary) hypertension: Secondary | ICD-10-CM

## 2013-06-26 MED ORDER — QUINAPRIL HCL 40 MG PO TABS: 40.0000 mg | ORAL_TABLET | Freq: Every day | ORAL | Status: DC

## 2013-07-29 ENCOUNTER — Ambulatory Visit (INDEPENDENT_AMBULATORY_CARE_PROVIDER_SITE_OTHER): Payer: Medicaid Other | Admitting: Cardiology

## 2013-07-29 ENCOUNTER — Encounter: Payer: Self-pay | Admitting: Cardiology

## 2013-07-29 VITALS — BP 140/100 | HR 60 | Ht 65.0 in | Wt 134.0 lb

## 2013-07-29 DIAGNOSIS — I10 Essential (primary) hypertension: Secondary | ICD-10-CM

## 2013-07-29 DIAGNOSIS — Z23 Encounter for immunization: Secondary | ICD-10-CM

## 2013-07-29 DIAGNOSIS — I25810 Atherosclerosis of coronary artery bypass graft(s) without angina pectoris: Secondary | ICD-10-CM

## 2013-07-29 DIAGNOSIS — Z0000 Encounter for general adult medical examination without abnormal findings: Secondary | ICD-10-CM

## 2013-07-29 DIAGNOSIS — E785 Hyperlipidemia, unspecified: Secondary | ICD-10-CM

## 2013-07-29 NOTE — Progress Notes (Signed)
HPI The patient presents as a new patient for me.  She previously saw Dr. Daleen Squibb.  She has a history of CABG.  For the past 7 months she has had gout and is walking with a cane. Despite this she walks every day.  The patient denies any new symptoms such as chest discomfort, neck or arm discomfort. There has been no new shortness of breath, PND or orthopnea. There have been no reported palpitations, presyncope or syncope.    No Known Allergies  Current Outpatient Prescriptions  Medication Sig Dispense Refill  . aspirin 81 MG tablet Take 81 mg by mouth daily.        . clopidogrel (PLAVIX) 75 MG tablet Take 1 tablet (75 mg total) by mouth daily.  90 tablet  0  . HYDROcodone-acetaminophen (VICODIN) 5-500 MG per tablet Take 1 tablet by mouth every 6 (six) hours as needed.        . metoprolol succinate (TOPROL-XL) 100 MG 24 hr tablet Take 1 and 1/2 tablets by mouth each morning  137 tablet  3  . Multiple Vitamins-Minerals (MULTIVITAMIN WITH MINERALS) tablet Take 1 tablet by mouth daily.        . quinapril (ACCUPRIL) 40 MG tablet Take 1 tablet (40 mg total) by mouth daily.  90 tablet  0  . rosuvastatin (CRESTOR) 10 MG tablet 1 tablet every other day at bedtime  90 tablet  3  . verapamil (CALAN-SR) 240 MG CR tablet Take 1 tablet (240 mg total) by mouth daily.  90 tablet  3   No current facility-administered medications for this visit.    Past Medical History  Diagnosis Date  . Coronary artery disease 05/2007    cabgx4   . Stroke   . Hyperlipidemia   . Hypertension   . Rotator cuff syndrome of left shoulder   . Hammer toe   . Other abnormality of red blood cells   . Bicipital tenosynovitis   . Family history of ischemic heart disease   . Hip, thigh, leg, and ankle, insect bite, nonvenomous, without mention of infection(916.4)     Past Surgical History  Procedure Laterality Date  . Coronary artery bypass graft      triple  . Tubal ligation    . Cardiac catheterization    . Coronary  artery bypass graft  2008    ROS:  As stated in the HPI and negative for all other systems.  PHYSICAL EXAM BP 140/100  Pulse 60  Ht 5\' 5"  (1.651 m)  Wt 134 lb (60.782 kg)  BMI 22.30 kg/m2 GENERAL:  Well appearing HEENT:  Pupils equal round and reactive, fundi not visualized, oral mucosa unremarkable NECK:  No jugular venous distention, waveform within normal limits, carotid upstroke brisk and symmetric, no bruits, no thyromegaly LYMPHATICS:  No cervical, inguinal adenopathy LUNGS:  Clear to auscultation bilaterally BACK:  No CVA tenderness CHEST:  Well healed sternotomy scar. HEART:  PMI not displaced or sustained,S1 and S2 within normal limits, no S3, no S4, no clicks, no rubs, no murmurs ABD:  Flat, positive bowel sounds normal in frequency in pitch, no bruits, no rebound, no guarding, no midline pulsatile mass, no hepatomegaly, no splenomegaly EXT:  2 plus pulses throughout, no edema, no cyanosis no clubbing SKIN:  No rashes no nodules NEURO:  Cranial nerves II through XII grossly intact, motor grossly intact throughout PSYCH:  Cognitively intact, oriented to person place and time   EKG:  Sinus rhythm, rate 60, LVH with  repolarization. No change from previous.  07/29/2013  ASSESSMENT AND PLAN   CAD:  The patient has no new sypmtoms.  No further cardiovascular testing is indicated.  We will continue with aggressive risk reduction and meds as listed.  She remains on Plavix because of her previous stroke.    HTN:  Her BP is too high.  However, she does not want to take more meds.  She will try to keep a BP diary and I can treat according to this.  HYPERLIPIDEMIA:  She is given written instructions for a lipid profile.

## 2013-07-29 NOTE — Patient Instructions (Addendum)
The current medical regimen is effective;  continue present plan and medications.  Please get flu shot today.  You have been given a prescription to have a fasting lipid panel.  Follow up in 6 months with Dr Antoine Poche.  You will receive a letter in the mail 2 months before you are due.  Please call us when you receive this letter to schedule your follow up appointment.

## 2013-07-31 ENCOUNTER — Other Ambulatory Visit: Payer: Self-pay | Admitting: Nurse Practitioner

## 2013-07-31 ENCOUNTER — Other Ambulatory Visit: Payer: Self-pay | Admitting: Internal Medicine

## 2013-07-31 DIAGNOSIS — N6320 Unspecified lump in the left breast, unspecified quadrant: Secondary | ICD-10-CM

## 2013-08-12 ENCOUNTER — Ambulatory Visit
Admission: RE | Admit: 2013-08-12 | Discharge: 2013-08-12 | Disposition: A | Discharge status: Home / Self Care | Payer: Medicaid Other | Source: Ambulatory Visit | Attending: Internal Medicine | Admitting: Internal Medicine

## 2013-08-12 ENCOUNTER — Other Ambulatory Visit: Payer: Self-pay | Admitting: Internal Medicine

## 2013-08-12 DIAGNOSIS — N6320 Unspecified lump in the left breast, unspecified quadrant: Secondary | ICD-10-CM

## 2013-08-26 ENCOUNTER — Ambulatory Visit
Admission: RE | Admit: 2013-08-26 | Discharge: 2013-08-26 | Disposition: A | Discharge status: Home / Self Care | Payer: Medicaid Other | Source: Ambulatory Visit | Attending: Internal Medicine | Admitting: Internal Medicine

## 2013-08-26 ENCOUNTER — Other Ambulatory Visit: Payer: Self-pay | Admitting: Internal Medicine

## 2013-08-26 DIAGNOSIS — N6320 Unspecified lump in the left breast, unspecified quadrant: Secondary | ICD-10-CM

## 2013-09-24 ENCOUNTER — Telehealth: Payer: Self-pay | Admitting: Cardiology

## 2013-09-24 NOTE — Telephone Encounter (Signed)
New message         The office of Dr Benson Norway would like to know if it is ok for pt to stop plavix 5 days before her colonoscopy on 12/30/13. Please call the office.

## 2013-09-24 NOTE — Telephone Encounter (Signed)
Is now After business hours unable to leave a message.

## 2013-09-25 NOTE — Telephone Encounter (Signed)
Will forward to Dr Hochrein for review and orders 

## 2013-09-29 NOTE — Telephone Encounter (Signed)
DR HUNG'S OFFICE  NOTIFIED./CY

## 2013-09-29 NOTE — Telephone Encounter (Signed)
Yes it is OK to hold Plavix.

## 2013-10-30 LAB — HM COLONOSCOPY

## 2013-11-13 ENCOUNTER — Other Ambulatory Visit: Payer: Self-pay

## 2013-11-13 ENCOUNTER — Other Ambulatory Visit: Payer: Self-pay | Admitting: Cardiology

## 2013-11-13 DIAGNOSIS — I10 Essential (primary) hypertension: Secondary | ICD-10-CM

## 2013-11-13 MED ORDER — METOPROLOL SUCCINATE ER 100 MG PO TB24: ORAL_TABLET | ORAL | Status: DC

## 2013-11-13 MED ORDER — QUINAPRIL HCL 40 MG PO TABS: 40.0000 mg | ORAL_TABLET | Freq: Every day | ORAL | Status: DC

## 2013-11-13 MED ORDER — VERAPAMIL HCL ER 240 MG PO TBCR: 240.0000 mg | EXTENDED_RELEASE_TABLET | Freq: Every day | ORAL | Status: DC

## 2013-11-13 MED ORDER — ROSUVASTATIN CALCIUM 10 MG PO TABS: ORAL_TABLET | ORAL | Status: DC

## 2013-11-13 MED ORDER — CLOPIDOGREL BISULFATE 75 MG PO TABS: 75.0000 mg | ORAL_TABLET | Freq: Every day | ORAL | Status: DC

## 2013-11-17 ENCOUNTER — Telehealth: Payer: Self-pay | Admitting: *Deleted

## 2013-11-17 NOTE — Telephone Encounter (Signed)
PA to [facility] for crestor and accupril

## 2013-12-05 NOTE — Progress Notes (Signed)
Patient ID: Kimberly [name], female   DOB: 05-27-1956, 58 y.o.   MRN: [id number] Placed samples of crestor back in the closet, after 3 weeks

## 2014-01-09 ENCOUNTER — Encounter: Payer: Self-pay | Admitting: Cardiology

## 2014-01-27 ENCOUNTER — Ambulatory Visit: Payer: Medicaid Other | Admitting: Cardiology

## 2014-03-10 ENCOUNTER — Ambulatory Visit: Payer: Medicaid Other | Admitting: Cardiology

## 2014-04-27 ENCOUNTER — Ambulatory Visit (INDEPENDENT_AMBULATORY_CARE_PROVIDER_SITE_OTHER): Payer: Medicaid Other | Admitting: Cardiology

## 2014-04-27 ENCOUNTER — Encounter: Payer: Self-pay | Admitting: Cardiology

## 2014-04-27 VITALS — BP 170/100 | HR 60 | Ht 66.0 in | Wt 131.9 lb

## 2014-04-27 DIAGNOSIS — I25810 Atherosclerosis of coronary artery bypass graft(s) without angina pectoris: Secondary | ICD-10-CM

## 2014-04-27 DIAGNOSIS — I10 Essential (primary) hypertension: Secondary | ICD-10-CM

## 2014-04-27 MED ORDER — SPIRONOLACTONE 25 MG PO TABS: 25.0000 mg | ORAL_TABLET | Freq: Every day | ORAL | Status: DC

## 2014-04-27 MED ORDER — BD ASSURE BPM/AUTO ARM CUFF MISC: 1.0000 | Freq: Once | Status: DC

## 2014-04-27 NOTE — Patient Instructions (Signed)
Have your labs done in 10 days  We are giving you a script for a BP cuff  Your physician recommends that you schedule a follow-up appointment in: 6 months

## 2014-04-27 NOTE — Progress Notes (Signed)
HPI The patient presents as a new patient for follow up of CAD/CABG.  The patient denies any new symptoms such as chest discomfort, neck or arm discomfort. There has been no new shortness of breath, PND or orthopnea. There have been no reported palpitations, presyncope or syncope.    She does walk with a cane because of gout.    No Known Allergies  Current Outpatient Prescriptions  Medication Sig Dispense Refill  . aspirin 81 MG tablet Take 81 mg by mouth daily.        . clopidogrel (PLAVIX) 75 MG tablet Take 1 tablet (75 mg total) by mouth daily.  90 tablet  1  . HYDROcodone-acetaminophen (VICODIN) 5-500 MG per tablet Take 1 tablet by mouth every 6 (six) hours as needed.        . metoprolol succinate (TOPROL-XL) 100 MG 24 hr tablet Take 1 and 1/2 tablets by mouth each morning  137 tablet  1  . Multiple Vitamins-Minerals (MULTIVITAMIN WITH MINERALS) tablet Take 1 tablet by mouth daily.        . quinapril (ACCUPRIL) 40 MG tablet Take 1 tablet (40 mg total) by mouth daily.  90 tablet  1  . rosuvastatin (CRESTOR) 10 MG tablet 1 tablet every other day at bedtime  90 tablet  1  . verapamil (CALAN-SR) 240 MG CR tablet Take 1 tablet (240 mg total) by mouth daily.  90 tablet  1   No current facility-administered medications for this visit.    Past Medical History  Diagnosis Date  . Coronary artery disease 05/2007    cabgx4   . Stroke   . Hyperlipidemia   . Hypertension   . Rotator cuff syndrome of left shoulder   . Hammer toe   . Other abnormality of red blood cells   . Bicipital tenosynovitis   . Family history of ischemic heart disease   . Hip, thigh, leg, and ankle, insect bite, nonvenomous, without mention of infection(916.4)     Past Surgical History  Procedure Laterality Date  . Coronary artery bypass graft      triple  . Tubal ligation    . Cardiac catheterization    . Coronary artery bypass graft  2008    ROS:  As stated in the HPI and negative for all other  systems.  PHYSICAL EXAM BP 170/100  Pulse 60  Ht 5\' 6"  (1.676 m)  Wt 131 lb 14.4 oz (59.829 kg)  BMI 21.30 kg/m2 GENERAL:  Well appearing HEENT:  Pupils equal round and reactive, fundi not visualized, oral mucosa unremarkable NECK:  No jugular venous distention, waveform within normal limits, carotid upstroke brisk and symmetric, no bruits, no thyromegaly LYMPHATICS:  No cervical, inguinal adenopathy LUNGS:  Clear to auscultation bilaterally BACK:  No CVA tenderness CHEST:  Well healed sternotomy scar. HEART:  PMI not displaced or sustained,S1 and S2 within normal limits, no S3, no S4, no clicks, no rubs, no murmurs ABD:  Flat, positive bowel sounds normal in frequency in pitch, no bruits, no rebound, no guarding, no midline pulsatile mass, no hepatomegaly, no splenomegaly EXT:  2 plus pulses throughout, no edema, no cyanosis no clubbing SKIN:  No rashes no nodules NEURO:  Cranial nerves II through XII grossly intact, motor grossly intact throughout PSYCH:  Cognitively intact, oriented to person place and time   EKG:  Sinus rhythm, rate 60, LVH with repolarization and deep T wave inversion. No change from previous.  04/27/2014  ASSESSMENT AND PLAN  CAD:  The patient has no new sypmtoms.  No further cardiovascular testing is indicated.  We will continue with aggressive risk reduction and meds as listed.  She remains on Plavix because of her previous stroke.    HTN:  Her BP is too high.  She agrees to start spironolactone.  She will get a basic metabolic profile in 10 days. She can otherwise continue the meds as listed.  HYPERLIPIDEMIA:  She is given written instructions for a lipid profile.

## 2014-05-05 LAB — BASIC METABOLIC PANEL
BUN: 17 mg/dL (ref 6–23)
CALCIUM: 9.7 mg/dL (ref 8.4–10.5)
CO2: 26 meq/L (ref 19–32)
Chloride: 107 mEq/L (ref 96–112)
Creat: 0.98 mg/dL (ref 0.50–1.10)
GLUCOSE: 90 mg/dL (ref 70–99)
Potassium: 4.4 mEq/L (ref 3.5–5.3)
Sodium: 142 mEq/L (ref 135–145)

## 2014-05-05 LAB — LIPID PANEL
CHOL/HDL RATIO: 3.4 ratio
CHOLESTEROL: 150 mg/dL (ref 0–200)
HDL: 44 mg/dL (ref >39)
LDL Cholesterol: 92 mg/dL (ref 0–99)
Triglycerides: 71 mg/dL (ref <150)
VLDL: 14 mg/dL (ref 0–40)

## 2014-06-05 ENCOUNTER — Telehealth: Payer: Self-pay | Admitting: Cardiology

## 2014-06-05 NOTE — Telephone Encounter (Signed)
New message      Pt want Dr Percival Spanish to presc something for her gout

## 2014-06-05 NOTE — Telephone Encounter (Signed)
Pt. Called and I had to leave a message for her to see her PCP for treatment of Gout

## 2014-06-19 ENCOUNTER — Other Ambulatory Visit: Payer: Self-pay

## 2014-06-19 MED ORDER — METOPROLOL SUCCINATE ER 100 MG PO TB24: ORAL_TABLET | ORAL | Status: DC

## 2014-11-18 ENCOUNTER — Ambulatory Visit (INDEPENDENT_AMBULATORY_CARE_PROVIDER_SITE_OTHER): Payer: Medicaid Other | Admitting: Cardiology

## 2014-11-18 ENCOUNTER — Telehealth: Payer: Self-pay | Admitting: *Deleted

## 2014-11-18 ENCOUNTER — Encounter: Payer: Self-pay | Admitting: Cardiology

## 2014-11-18 VITALS — BP 196/102 | HR 66 | Ht 66.0 in | Wt 138.6 lb

## 2014-11-18 DIAGNOSIS — I10 Essential (primary) hypertension: Secondary | ICD-10-CM | POA: Diagnosis not present

## 2014-11-18 DIAGNOSIS — I25810 Atherosclerosis of coronary artery bypass graft(s) without angina pectoris: Secondary | ICD-10-CM | POA: Diagnosis not present

## 2014-11-18 MED ORDER — CLONIDINE HCL 0.1 MG/24HR TD PTWK: 0.1000 mg | MEDICATED_PATCH | TRANSDERMAL | Status: DC

## 2014-11-18 NOTE — Progress Notes (Signed)
HPI The patient presents as a new patient for follow up of CAD/CABG.  The patient denies any new symptoms such as chest discomfort, neck or arm discomfort. There has been no new shortness of breath, PND or orthopnea. There have been no reported palpitations, presyncope or syncope.    She does walk with a cane.  She has been mostly bothered by gout. At the last visit I added spironolactone to her regimen. However, just in the last couple of weeks she's developed a rash on her face which could be related to this. She otherwise is doing okay. She denies any cardiovascular symptoms such as chest pressure, neck or arm discomfort. She has no acute shortness of breath, PND or orthopnea. She's had no weight gain or edema.  No Known Allergies  Current Outpatient Prescriptions  Medication Sig Dispense Refill  . aspirin 81 MG tablet Take 81 mg by mouth daily.      . Blood Pressure Monitoring (B-D ASSURE BPM/AUTO ARM CUFF) MISC 1 Device by Does not apply route once. 1 each 0  . clopidogrel (PLAVIX) 75 MG tablet Take 1 tablet (75 mg total) by mouth daily. 90 tablet 1  . metoprolol succinate (TOPROL-XL) 100 MG 24 hr tablet Take 1 and 1/2 tablets by mouth each morning 137 tablet 1  . Multiple Vitamins-Minerals (MULTIVITAMIN WITH MINERALS) tablet Take 1 tablet by mouth daily.      . quinapril (ACCUPRIL) 40 MG tablet Take 1 tablet (40 mg total) by mouth daily. 90 tablet 1  . rosuvastatin (CRESTOR) 10 MG tablet 1 tablet every other day at bedtime 90 tablet 1  . spironolactone (ALDACTONE) 25 MG tablet Take 1 tablet (25 mg total) by mouth daily. 90 tablet 3  . verapamil (CALAN-SR) 240 MG CR tablet Take 1 tablet (240 mg total) by mouth daily. 90 tablet 1   No current facility-administered medications for this visit.    Past Medical History  Diagnosis Date  . Coronary artery disease 05/2007    cabgx4   . Stroke   . Hyperlipidemia   . Hypertension   . Rotator cuff syndrome of left shoulder   . Hammer toe    . Other abnormality of red blood cells   . Bicipital tenosynovitis   . Family history of ischemic heart disease   . Hip, thigh, leg, and ankle, insect bite, nonvenomous, without mention of infection(916.4)     Past Surgical History  Procedure Laterality Date  . Coronary artery bypass graft      triple  . Tubal ligation    . Cardiac catheterization    . Coronary artery bypass graft  2008    ROS:  Gout.  Otherwise as stated in the HPI and negative for all other systems.  PHYSICAL EXAM BP 196/102 mmHg  Pulse 66  Ht 5\' 6"  (1.676 m)  Wt 138 lb 9.6 oz (62.869 kg)  BMI 22.38 kg/m2 GENERAL:  Well appearing HEENT:  Pupils equal round and reactive, fundi not visualized, oral mucosa unremarkable NECK:  No jugular venous distention, waveform within normal limits, carotid upstroke brisk and symmetric, no bruits, no thyromegaly LYMPHATICS:  No cervical, inguinal adenopathy LUNGS:  Clear to auscultation bilaterally BACK:  No CVA tenderness CHEST:  Well healed sternotomy scar. HEART:  PMI not displaced or sustained,S1 and S2 within normal limits, no S3, no S4, no clicks, no rubs, no murmurs ABD:  Flat, positive bowel sounds normal in frequency in pitch, no bruits, no rebound, no guarding, no midline  pulsatile mass, no hepatomegaly, no splenomegaly EXT:  2 plus pulses throughout, no edema, no cyanosis no clubbing SKIN:  Rash on face.   NEURO:  Left-sided weakness    ASSESSMENT AND PLAN   CAD:  The patient has no new sypmtoms.  No further cardiovascular testing is indicated.  We will continue with aggressive risk reduction and meds as listed.  She remains on Plavix because of her previous stroke.    HTN:  Her BP is too high.  However she may be having a rash related to the spironolactone. Therefore, I will switch her to a Catapres patch. She says she would have trouble taking any additional oral medication was more than once daily.  HYPERLIPIDEMIA:  Her LDL last fall was 92 and HDL of  44. She will continue on meds as listed.

## 2014-11-18 NOTE — Patient Instructions (Signed)
Stop Aldactone  Start Catapres TTS 1 patch apply every 7 days   Your physician recommends that you schedule a follow-up appointment in: 2 weeks wit a extender

## 2014-11-18 NOTE — Telephone Encounter (Signed)
P.A for Clonidine Patch sent to plan via Cover My Meds

## 2014-11-23 ENCOUNTER — Telehealth: Payer: Self-pay | Admitting: Cardiology

## 2014-11-23 DIAGNOSIS — I10 Essential (primary) hypertension: Secondary | ICD-10-CM

## 2014-11-23 MED ORDER — QUINAPRIL HCL 40 MG PO TABS: 40.0000 mg | ORAL_TABLET | Freq: Every day | ORAL | Status: DC

## 2014-11-23 MED ORDER — ROSUVASTATIN CALCIUM 10 MG PO TABS: ORAL_TABLET | ORAL | Status: DC

## 2014-11-23 MED ORDER — CLOPIDOGREL BISULFATE 75 MG PO TABS: 75.0000 mg | ORAL_TABLET | Freq: Every day | ORAL | Status: DC

## 2014-11-23 MED ORDER — METOPROLOL SUCCINATE ER 100 MG PO TB24: ORAL_TABLET | ORAL | Status: DC

## 2014-11-23 MED ORDER — VERAPAMIL HCL ER 240 MG PO TBCR: 240.0000 mg | EXTENDED_RELEASE_TABLET | Freq: Every day | ORAL | Status: DC

## 2014-11-23 NOTE — Telephone Encounter (Signed)
Please call,when she saw Dr Warren Lacy last,she thought he was going to call in all of her medicine and a new prescription. There is nothing at the pharmacy.

## 2014-11-23 NOTE — Telephone Encounter (Signed)
Left message for patient to call back  

## 2014-11-23 NOTE — Telephone Encounter (Signed)
Contacted pt, verified current meds she needed refilled.   Refills sent to pharmacy, pt informed.

## 2014-11-24 ENCOUNTER — Telehealth: Payer: Self-pay | Admitting: *Deleted

## 2014-11-24 NOTE — Telephone Encounter (Signed)
P.A. For Crestor and Quinapril both started and faxed to [facility]. Awaiting approval

## 2014-12-01 NOTE — Telephone Encounter (Signed)
Called [name] tracks this a.m. Who confirmed that both medications ( Quinapril and Crestor) have been approved 11/24/14-11/19/15

## 2014-12-02 ENCOUNTER — Encounter: Payer: Self-pay | Admitting: Pharmacist Clinician (PhC)/ Clinical Pharmacy Specialist

## 2014-12-02 ENCOUNTER — Ambulatory Visit (INDEPENDENT_AMBULATORY_CARE_PROVIDER_SITE_OTHER): Payer: Medicaid Other | Admitting: Pharmacist Clinician (PhC)/ Clinical Pharmacy Specialist

## 2014-12-02 VITALS — BP 160/104 | HR 56 | Ht 66.0 in | Wt 138.5 lb

## 2014-12-02 DIAGNOSIS — I10 Essential (primary) hypertension: Secondary | ICD-10-CM | POA: Diagnosis not present

## 2014-12-02 NOTE — Assessment & Plan Note (Signed)
Per patient and daughter she has a long history of uncontrolled blood pressure.   She was prescribed clonidine patches at her last visit with Dr. Percival Spanish, but these were never picked up.  Patient states prescription was never at pharmacy.  She is highly resistant to adding medication or adjusting the meds she currently takes.  I asked about switching some of her meds to evenings, to get better BP control, but she is unwilling to do that.  I am not sure if the patches needed prior authorization or the prescription just didn't make it to the pharmacy (our records show it was received).  Regardless, she never used.  Today will hold off on patches, start her on chlorthalidone 25 mg daily.  Pt not happy about added medication, but her daughter states that they will get the med and be sure she uses it.  Daughter lives in [place], so not sure how this will work.  Will see her back in 4 weeks for follow up.

## 2014-12-02 NOTE — Progress Notes (Signed)
     12/02/2014 Kimberly [name] 02-16-1956 [id number]   HPI:  Kimberly [name] is a 59 y.o. female patient of Dr Percival Spanish, with a PMH below who presents today for hypertension clinic evaluation.  Cardiac Hx: CAD/CABG in 2008  Family Hx: mother and MGM both had hypertension, as do several siblings  Social Hx: no alcohol or tobacco (quit in 2015)  Diet: eats mostly at home, states uses some salt and/or MsDash when cooking, never at table;  Drinks 2 cups coffee/day and close to 2 liters of pepsi daily  Exercise: not able to do much, apparently has chronic gout problems in foot; walks to mailbox and back daily  Home BP readings: no home cuff  Current antihypertensive medications: metoprolol succ 150mg  qd, quinapril 40 mg qd, verapamil 240 mg qd - takes all medications in the morning.   Current Outpatient Prescriptions  Medication Sig Dispense Refill  . aspirin 81 MG tablet Take 81 mg by mouth daily.      . Blood Pressure Monitoring (B-D ASSURE BPM/AUTO ARM CUFF) MISC 1 Device by Does not apply route once. 1 each 0  . clopidogrel (PLAVIX) 75 MG tablet Take 1 tablet (75 mg total) by mouth daily. 90 tablet 3  . metoprolol succinate (TOPROL-XL) 100 MG 24 hr tablet Take 1 and 1/2 tablets by mouth each morning 137 tablet 3  . Multiple Vitamins-Minerals (MULTIVITAMIN WITH MINERALS) tablet Take 1 tablet by mouth daily.      . quinapril (ACCUPRIL) 40 MG tablet Take 1 tablet (40 mg total) by mouth daily. 90 tablet 3  . rosuvastatin (CRESTOR) 10 MG tablet 1 tablet every other day at bedtime 90 tablet 3  . verapamil (CALAN-SR) 240 MG CR tablet Take 1 tablet (240 mg total) by mouth daily. 90 tablet 3   No current facility-administered medications for this visit.    Allergies  Allergen Reactions  . Spironolactone Rash    Past Medical History  Diagnosis Date  . Coronary artery disease 05/2007    cabgx4   . Stroke   . Hyperlipidemia   . Hypertension   . Rotator cuff syndrome of left  shoulder   . Hammer toe   . Other abnormality of red blood cells   . Bicipital tenosynovitis   . Family history of ischemic heart disease   . Hip, thigh, leg, and ankle, insect bite, nonvenomous, without mention of infection(916.4)     Blood pressure 160/104, pulse 56, height 5\' 6"  (1.676 m), weight 138 lb 8 oz (62.823 kg).    Tommy Medal PharmD CPP [facility]

## 2014-12-02 NOTE — Patient Instructions (Signed)
Return for a a follow up appointment in 1 month  Your blood pressure today is 160/104  Check your blood pressure at home daily (if able) and keep record of the readings.  Take your BP meds as follows: add chlorthalidone 25 mg once daily  Bring all of your meds, your BP cuff and your record of home blood pressures to your next appointment.  Exercise as you're able, try to walk approximately 30 minutes per day.  Keep salt intake to a minimum, especially watch canned and prepared boxed foods.  Eat more fresh fruits and vegetables and fewer canned items.  Avoid eating in fast food restaurants.

## 2015-01-01 ENCOUNTER — Ambulatory Visit: Payer: Medicaid Other | Admitting: Pharmacist Clinician (PhC)/ Clinical Pharmacy Specialist

## 2015-02-05 ENCOUNTER — Other Ambulatory Visit: Payer: Self-pay | Admitting: Cardiology

## 2015-02-11 ENCOUNTER — Ambulatory Visit: Payer: Medicaid Other | Admitting: Pharmacist Clinician (PhC)/ Clinical Pharmacy Specialist

## 2015-03-12 ENCOUNTER — Ambulatory Visit: Payer: Medicaid Other | Admitting: Pharmacist Clinician (PhC)/ Clinical Pharmacy Specialist

## 2015-07-16 ENCOUNTER — Other Ambulatory Visit: Payer: Self-pay | Admitting: Cardiology

## 2015-07-16 NOTE — Telephone Encounter (Signed)
[place] at home and cell #'s.  Pharmacy requesting refills on Catapres patches.  Our records do not show this on her med list. In fact, last visit was 4/16 with [name], PharmD and she was to return in 4 week.  Message left indicated we need to discuss refill (per [name] if she has been using the patches go ahead a refill x 1 and make an appointment) and appointment.  PER THE PHARMACY SHE HAS BEEN GETTING THIS RX EVERY MONTH.  REFILL X 1 GIVEN WITH MESSAGE TO MAKE APPT FOR FUTURE REFILLS.

## 2015-07-19 ENCOUNTER — Telehealth: Payer: Self-pay | Admitting: Cardiology

## 2015-07-19 NOTE — Telephone Encounter (Signed)
RN SPOKE TO PHARMACIST - SHE SHE STATES CLONIDINIE WENT THROUGH AND PATIENT CAN PICK UP PHARMACIST STATES METOPROLOL IS NOT READY TO PICK UP  UNTIL Aug 07 2015  RN INFORMED PATIENT.SHE VERBALIZED UNDERSTANDING. SPOKE TO PATIENT - SCHEDULE  APPOINTMENT 08/22/14 AT 9:30 AM

## 2015-07-19 NOTE — Telephone Encounter (Signed)
°*  STAT* If patient is at the pharmacy, call can be transferred to refill team.   1. Which medications need to be refilled? (please list name of each medication and dose if known) Clonidine Patch(she says that the pharmacy didn't have it) and Metoprolol  2. Which pharmacy/location (including street and city if local pharmacy) is medication to be sent to?[facility] on [address]  3. Do they need a 30 day or 90 day supply? [place]

## 2015-08-19 ENCOUNTER — Other Ambulatory Visit: Payer: Self-pay | Admitting: Cardiology

## 2015-08-20 ENCOUNTER — Telehealth: Payer: Self-pay | Admitting: Pharmacist Clinician (PhC)/ Clinical Pharmacy Specialist

## 2015-08-20 NOTE — Telephone Encounter (Signed)
Pt called to cancel appointment due to upcoming snow.   Returned call and patient states her BP is fine and does not need to come, states too hard to get out or get ride here.  Asked that if she notices her BP increase she should call the office.

## 2015-08-23 ENCOUNTER — Ambulatory Visit: Payer: Medicaid Other | Admitting: Pharmacist Clinician (PhC)/ Clinical Pharmacy Specialist

## 2015-09-15 ENCOUNTER — Other Ambulatory Visit: Payer: Self-pay | Admitting: Cardiology

## 2015-12-26 ENCOUNTER — Emergency Department (HOSPITAL_COMMUNITY)
Admission: EM | Admit: 2015-12-26 | Discharge: 2015-12-26 | Disposition: A | Discharge status: Home / Self Care | Payer: Medicaid Other | Attending: Emergency Medicine | Admitting: Emergency Medicine

## 2015-12-26 ENCOUNTER — Encounter (HOSPITAL_COMMUNITY): Payer: Self-pay | Admitting: *Deleted

## 2015-12-26 DIAGNOSIS — I2510 Atherosclerotic heart disease of native coronary artery without angina pectoris: Secondary | ICD-10-CM | POA: Diagnosis not present

## 2015-12-26 DIAGNOSIS — B9689 Other specified bacterial agents as the cause of diseases classified elsewhere: Secondary | ICD-10-CM

## 2015-12-26 DIAGNOSIS — Z8739 Personal history of other diseases of the musculoskeletal system and connective tissue: Secondary | ICD-10-CM | POA: Diagnosis not present

## 2015-12-26 DIAGNOSIS — Z87828 Personal history of other (healed) physical injury and trauma: Secondary | ICD-10-CM | POA: Insufficient documentation

## 2015-12-26 DIAGNOSIS — Z87891 Personal history of nicotine dependence: Secondary | ICD-10-CM | POA: Diagnosis not present

## 2015-12-26 DIAGNOSIS — Z8673 Personal history of transient ischemic attack (TIA), and cerebral infarction without residual deficits: Secondary | ICD-10-CM | POA: Diagnosis not present

## 2015-12-26 DIAGNOSIS — E785 Hyperlipidemia, unspecified: Secondary | ICD-10-CM | POA: Diagnosis not present

## 2015-12-26 DIAGNOSIS — I10 Essential (primary) hypertension: Secondary | ICD-10-CM | POA: Insufficient documentation

## 2015-12-26 DIAGNOSIS — Z9851 Tubal ligation status: Secondary | ICD-10-CM | POA: Insufficient documentation

## 2015-12-26 DIAGNOSIS — Z7902 Long term (current) use of antithrombotics/antiplatelets: Secondary | ICD-10-CM | POA: Diagnosis not present

## 2015-12-26 DIAGNOSIS — Z951 Presence of aortocoronary bypass graft: Secondary | ICD-10-CM | POA: Insufficient documentation

## 2015-12-26 DIAGNOSIS — Z79899 Other long term (current) drug therapy: Secondary | ICD-10-CM | POA: Diagnosis not present

## 2015-12-26 DIAGNOSIS — R1084 Generalized abdominal pain: Secondary | ICD-10-CM

## 2015-12-26 DIAGNOSIS — N898 Other specified noninflammatory disorders of vagina: Secondary | ICD-10-CM | POA: Diagnosis present

## 2015-12-26 DIAGNOSIS — N760 Acute vaginitis: Secondary | ICD-10-CM | POA: Insufficient documentation

## 2015-12-26 HISTORY — DX: Gout, unspecified: M10.9

## 2015-12-26 LAB — WET PREP, GENITAL
Sperm: NONE SEEN
Trich, Wet Prep: NONE SEEN
YEAST WET PREP: NONE SEEN

## 2015-12-26 LAB — COMPREHENSIVE METABOLIC PANEL
ALT: 15 U/L (ref 14–54)
AST: 18 U/L (ref 15–41)
Albumin: 3.7 g/dL (ref 3.5–5.0)
Alkaline Phosphatase: 83 U/L (ref 38–126)
Anion gap: 11 (ref 5–15)
BILIRUBIN TOTAL: 0.5 mg/dL (ref 0.3–1.2)
BUN: 12 mg/dL (ref 6–20)
CO2: 25 mmol/L (ref 22–32)
CREATININE: 0.96 mg/dL (ref 0.44–1.00)
Calcium: 9.6 mg/dL (ref 8.9–10.3)
Chloride: 105 mmol/L (ref 101–111)
GFR calc Af Amer: >60 mL/min (ref >60)
Glucose, Bld: 109 mg/dL — ABNORMAL HIGH (ref 65–99)
POTASSIUM: 3.5 mmol/L (ref 3.5–5.1)
Sodium: 141 mmol/L (ref 135–145)
TOTAL PROTEIN: 7.2 g/dL (ref 6.5–8.1)

## 2015-12-26 LAB — CBC
HCT: 38.2 % (ref 36.0–46.0)
Hemoglobin: 12 g/dL (ref 12.0–15.0)
MCH: 21.7 pg — AB (ref 26.0–34.0)
MCHC: 31.4 g/dL (ref 30.0–36.0)
MCV: 69 fL — AB (ref 78.0–100.0)
PLATELETS: 243 10*3/uL (ref 150–400)
RBC: 5.54 MIL/uL — ABNORMAL HIGH (ref 3.87–5.11)
RDW: 15 % (ref 11.5–15.5)
WBC: 6.4 10*3/uL (ref 4.0–10.5)

## 2015-12-26 LAB — LIPASE, BLOOD: Lipase: 21 U/L (ref 11–51)

## 2015-12-26 MED ORDER — METRONIDAZOLE 500 MG PO TABS: 500.0000 mg | ORAL_TABLET | Freq: Two times a day (BID) | ORAL | Status: DC

## 2015-12-26 MED ORDER — KETOROLAC TROMETHAMINE 60 MG/2ML IM SOLN
60.0000 mg | Freq: Once | INTRAMUSCULAR | Status: AC
  Administered 2015-12-26: 60 mg via INTRAMUSCULAR
  Filled 2015-12-26: qty 2

## 2015-12-26 MED ORDER — KETOROLAC TROMETHAMINE 30 MG/ML IJ SOLN: 30.0000 mg | Freq: Once | INTRAMUSCULAR | Status: DC

## 2015-12-26 MED ORDER — IBUPROFEN 600 MG PO TABS: 600.0000 mg | ORAL_TABLET | Freq: Four times a day (QID) | ORAL | Status: DC | PRN

## 2015-12-26 MED ORDER — MORPHINE SULFATE (PF) 4 MG/ML IV SOLN: 4.0000 mg | Freq: Once | INTRAVENOUS | Status: DC

## 2015-12-26 NOTE — Progress Notes (Signed)
HPI The patient presents as a new patient for follow up of CAD/CABG.  The patient denies any new symptoms such as chest discomfort, neck or arm discomfort. There has been no new shortness of breath, PND or orthopnea. There have been no reported palpitations, presyncope or syncope.    She does walk with a cane.  She has still been othered by gout.  She does ride her exercise bike daily without any symptoms. She enjoys this. She did cancel some blood pressure clinic appointments for transportation. Also she was having a rash with the clonidine patch and so she stopped using the .  The patient denies any new symptoms such as chest discomfort, neck or arm discomfort. There has been no new shortness of breath, PND or orthopnea. There have been no reported palpitations, presyncope or syncope.  She was in the ED yesterday with a vaginal discharge and was quite upset with this.  I reviewed these records at her request.   Allergies  Allergen Reactions  . Clonidine Derivatives Other (See Comments)    Patch causes scars  . Spironolactone Rash    Current Outpatient Prescriptions  Medication Sig Dispense Refill  . aspirin 81 MG tablet Take 81 mg by mouth daily.      . Blood Pressure Monitoring (B-D ASSURE BPM/AUTO ARM CUFF) MISC 1 Device by Does not apply route once. 1 each 0  . CATAPRES-TTS-1 0.1 MG/24HR patch PLACE 1 PATCH ONTO THE SKIN ONCE A WEEK, APPOINTMENT NEEDED FOR FUTURE REFILLS 4 patch 0  . clopidogrel (PLAVIX) 75 MG tablet Take 1 tablet (75 mg total) by mouth daily. 90 tablet 3  . indomethacin (INDOCIN) 50 MG capsule Take 50 mg by mouth 2 (two) times daily as needed (for gout flare ups).   0  . metoprolol succinate (TOPROL-XL) 50 MG 24 hr tablet Take 75 mg by mouth daily.   0  . Multiple Vitamins-Minerals (MULTIVITAMIN WITH MINERALS) tablet Take 1 tablet by mouth daily.      . quinapril (ACCUPRIL) 40 MG tablet Take 1 tablet (40 mg total) by mouth daily. 90 tablet 3  . rosuvastatin (CRESTOR)  10 MG tablet 1 tablet every other day at bedtime 90 tablet 3  . verapamil (CALAN-SR) 240 MG CR tablet Take 1 tablet (240 mg total) by mouth daily. 90 tablet 3  . ibuprofen (ADVIL,MOTRIN) 600 MG tablet Take 1 tablet (600 mg total) by mouth every 6 (six) hours as needed for mild pain or moderate pain. (Patient not taking: Reported on 12/27/2015) 15 tablet 0  . metroNIDAZOLE (FLAGYL) 500 MG tablet Take 1 tablet (500 mg total) by mouth 2 (two) times daily. (Patient not taking: Reported on 12/27/2015) 14 tablet 0   No current facility-administered medications for this visit.    Past Medical History  Diagnosis Date  . Coronary artery disease 05/2007    cabgx4   . Stroke ([place])   . Hyperlipidemia   . Hypertension   . Rotator cuff syndrome of left shoulder   . Hammer toe   . Other abnormality of red blood cells   . Bicipital tenosynovitis   . Family history of ischemic heart disease   . Hip, thigh, leg, and ankle, insect bite, nonvenomous, without mention of infection(916.4)   . Gout     Past Surgical History  Procedure Laterality Date  . Coronary artery bypass graft      triple  . Tubal ligation    . Cardiac catheterization    . Coronary  artery bypass graft  2008    ROS:  Gout.  Otherwise as stated in the HPI and negative for all other systems.  PHYSICAL EXAM BP 174/109 mmHg  Pulse 69  Ht 5\' 6"  (1.676 m)  Wt 130 lb 3.2 oz (59.058 kg)  BMI 21.02 kg/m2 GENERAL:  Well appearing NECK:  No jugular venous distention, waveform within normal limits, carotid upstroke brisk and symmetric, no bruits, no thyromegaly LYMPHATICS:  No cervical, inguinal adenopathy LUNGS:  Clear to auscultation bilaterally BACK:  No CVA tenderness CHEST:  Well healed sternotomy scar. HEART:  PMI is displaced and sustained or sustained,S1 and S2 within normal limits, no S3, no S4, no clicks, no rubs, no murmurs ABD:  Flat, positive bowel sounds normal in frequency in pitch, no bruits, no rebound, no guarding, no  midline pulsatile mass, no hepatomegaly, no splenomegaly EXT:  2 plus pulses throughout, no edema, no cyanosis no clubbing SKIN:  No rashes NEURO:  Left-sided weakness  EKG:  Sinus rhythm, rate 69, axis within normal limits, intervals within normal limits, premature ectopic complexes, anterolateral T wave inversions unchanged from previous. 12/27/2015  ASSESSMENT AND PLAN   CAD:  The patient has no new sypmtoms.  No further cardiovascular testing is indicated.  We will continue with aggressive risk reduction and meds as listed.  She remains on Plavix because of her previous stroke.    HTN:  Her BP is too high.  She does not tolerate the clonidine patch.  I will switch her back to clonidine PO.  She will follow in our HTN clinic.   HYPERLIPIDEMIA:  Her LDL in the fall of 2015 was 92 and HDL of 44. She will continue on meds as listed.  She needs to have a repeat fasting lipid and liver profile    Hospital records reviewed.

## 2015-12-26 NOTE — ED Notes (Signed)
Pt believes she has VD.  She has vaginal discharge and lower abdominal pain.

## 2015-12-26 NOTE — Discharge Instructions (Signed)
Read the information below.  Use the prescribed medication as directed.  Please discuss all new medications with your pharmacist.  You may return to the Emergency Department at any time for worsening condition or any new symptoms that concern you.  If you develop high fevers, worsening abdominal pain, uncontrolled vomiting, or are unable to tolerate fluids by mouth, return to the ER for a recheck.     Abdominal Pain, Adult Many things can cause abdominal pain. Usually, abdominal pain is not caused by a disease and will improve without treatment. It can often be observed and treated at home. Your health care provider will do a physical exam and possibly order blood tests and X-rays to help determine the seriousness of your pain. However, in many cases, more time must pass before a clear cause of the pain can be found. Before that point, your health care provider may not know if you need more testing or further treatment. HOME CARE INSTRUCTIONS Monitor your abdominal pain for any changes. The following actions may help to alleviate any discomfort you are experiencing:  Only take over-the-counter or prescription medicines as directed by your health care provider.  Do not take laxatives unless directed to do so by your health care provider.  Try a clear liquid diet (broth, tea, or water) as directed by your health care provider. Slowly move to a bland diet as tolerated. SEEK MEDICAL CARE IF:  You have unexplained abdominal pain.  You have abdominal pain associated with nausea or diarrhea.  You have pain when you urinate or have a bowel movement.  You experience abdominal pain that wakes you in the night.  You have abdominal pain that is worsened or improved by eating food.  You have abdominal pain that is worsened with eating fatty foods.  You have a fever. SEEK IMMEDIATE MEDICAL CARE IF:  Your pain does not go away within 2 hours.  You keep throwing up (vomiting).  Your pain is felt  only in portions of the abdomen, such as the right side or the left lower portion of the abdomen.  You pass bloody or black tarry stools. MAKE SURE YOU:  Understand these instructions.  Will watch your condition.  Will get help right away if you are not doing well or get worse.   This information is not intended to replace advice given to you by your health care provider. Make sure you discuss any questions you have with your health care provider.   Document Released: 05/10/2005 Document Revised: 04/21/2015 Document Reviewed: 04/09/2013 Elsevier Interactive Patient Education 2016 Elsevier Inc.  Bacterial Vaginosis Bacterial vaginosis is a vaginal infection that occurs when the normal balance of bacteria in the vagina is disrupted. It results from an overgrowth of certain bacteria. This is the most common vaginal infection in women of childbearing age. Treatment is important to prevent complications, especially in pregnant women, as it can cause a premature delivery. CAUSES  Bacterial vaginosis is caused by an increase in harmful bacteria that are normally present in smaller amounts in the vagina. Several different kinds of bacteria can cause bacterial vaginosis. However, the reason that the condition develops is not fully understood. RISK FACTORS Certain activities or behaviors can put you at an increased risk of developing bacterial vaginosis, including:  Having a new sex partner or multiple sex partners.  Douching.  Using an intrauterine device (IUD) for contraception. Women do not get bacterial vaginosis from toilet seats, bedding, swimming pools, or contact with objects around them. SIGNS  AND SYMPTOMS  Some women with bacterial vaginosis have no signs or symptoms. Common symptoms include:  Grey vaginal discharge.  A fishlike odor with discharge, especially after sexual intercourse.  Itching or burning of the vagina and vulva.  Burning or pain with urination. DIAGNOSIS    Your health care provider will take a medical history and examine the vagina for signs of bacterial vaginosis. A sample of vaginal fluid may be taken. Your health care provider will look at this sample under a microscope to check for bacteria and abnormal cells. A vaginal pH test may also be done.  TREATMENT  Bacterial vaginosis may be treated with antibiotic medicines. These may be given in the form of a pill or a vaginal cream. A second round of antibiotics may be prescribed if the condition comes back after treatment. Because bacterial vaginosis increases your risk for sexually transmitted diseases, getting treated can help reduce your risk for chlamydia, gonorrhea, HIV, and herpes. HOME CARE INSTRUCTIONS   Only take over-the-counter or prescription medicines as directed by your health care provider.  If antibiotic medicine was prescribed, take it as directed. Make sure you finish it even if you start to feel better.  Tell all sexual partners that you have a vaginal infection. They should see their health care provider and be treated if they have problems, such as a mild rash or itching.  During treatment, it is important that you follow these instructions:  Avoid sexual activity or use condoms correctly.  Do not douche.  Avoid alcohol as directed by your health care provider.  Avoid breastfeeding as directed by your health care provider. SEEK MEDICAL CARE IF:   Your symptoms are not improving after 3 days of treatment.  You have increased discharge or pain.  You have a fever. MAKE SURE YOU:   Understand these instructions.  Will watch your condition.  Will get help right away if you are not doing well or get worse. FOR MORE INFORMATION  Centers for Disease Control and Prevention, Division of STD Prevention: [employer]raiser[employer]raud[employer] American Sexual Health Association (ASHA): www.ashastd.org    This information is not intended to replace advice given to you by your health care  provider. Make sure you discuss any questions you have with your health care provider.   Document Released: 07/31/2005 Document Revised: 08/21/2014 Document Reviewed: 03/12/2013 Elsevier Interactive Patient Education [employer].

## 2015-12-26 NOTE — ED Provider Notes (Signed)
CSN: [id number]     Arrival date & time 12/26/15  1009 History   First MD Initiated Contact with Patient 12/26/15 1153     Chief Complaint  Patient presents with  . Vaginal Discharge     (Consider location/radiation/quality/duration/timing/severity/associated sxs/prior Treatment) The history is provided by the patient.     Pt presents concerned that her fiance gave her VD.  States she had lower abdominal pain x 3 days, crampy, sharp, and constant.  Pain is 10/10.  Has had abnormal vaginal discharge, occasional pain when she urinates.  Associated chills.  She vomited 3 days ago but not since.  Denies abnormal vaginal bleeding, denies any change in bowel movements.  Hx abdominal surgery of tubal ligation only.  Has taken no medications for her pain.   Past Medical History  Diagnosis Date  . Coronary artery disease 05/2007    cabgx4   . Stroke ([place])   . Hyperlipidemia   . Hypertension   . Rotator cuff syndrome of left shoulder   . Hammer toe   . Other abnormality of red blood cells   . Bicipital tenosynovitis   . Family history of ischemic heart disease   . Hip, thigh, leg, and ankle, insect bite, nonvenomous, without mention of infection(916.4)   . Gout    Past Surgical History  Procedure Laterality Date  . Coronary artery bypass graft      triple  . Tubal ligation    . Cardiac catheterization    . Coronary artery bypass graft  2008   Family History  Problem Relation Age of Onset  . Hypertension Mother   . Heart attack Father   . Diabetes Father   . Anxiety disorder Sister   . Sarcoidosis Sister    Social History  Substance Use Topics  . Smoking status: Former Research scientist (life sciences)  . Smokeless tobacco: Former Systems developer    Quit date: 08/09/2008  . Alcohol Use: 0.6 oz/week    1 Cans of beer per week   OB History    No data available     Review of Systems  All other systems reviewed and are negative.     Allergies  Spironolactone  Home Medications   Prior to Admission  medications   Medication Sig Start Date End Date Taking? Authorizing Provider  aspirin 81 MG tablet Take 81 mg by mouth daily.      Historical Provider, MD  Blood Pressure Monitoring (B-D ASSURE BPM/AUTO ARM CUFF) MISC 1 Device by Does not apply route once. 04/27/14   [name], MD  CATAPRES-TTS-1 0.1 MG/24HR patch PLACE 1 PATCH ONTO THE SKIN ONCE A WEEK, APPOINTMENT NEEDED FOR FUTURE REFILLS 08/20/15   [name], MD  clopidogrel (PLAVIX) 75 MG tablet Take 1 tablet (75 mg total) by mouth daily. 11/23/14   [name], MD  metoprolol succinate (TOPROL-XL) 100 MG 24 hr tablet Take 1 and 1/2 tablets by mouth each morning 11/23/14   [name], MD  Multiple Vitamins-Minerals (MULTIVITAMIN WITH MINERALS) tablet Take 1 tablet by mouth daily.      Historical Provider, MD  quinapril (ACCUPRIL) 40 MG tablet Take 1 tablet (40 mg total) by mouth daily. 11/23/14 11/28/15  [name], MD  rosuvastatin (CRESTOR) 10 MG tablet 1 tablet every other day at bedtime 11/23/14   [name], MD  verapamil (CALAN-SR) 240 MG CR tablet Take 1 tablet (240 mg total) by mouth daily. 11/23/14   [name], MD   BP 195/108 mmHg  Pulse 64  Temp(Src)  98.4 F (36.9 C) (Oral)  Resp 18  SpO2 100% Physical Exam  Constitutional: She appears well-developed and well-nourished. No distress.  HENT:  Head: Normocephalic and atraumatic.  Neck: Neck supple.  Cardiovascular: Normal rate and regular rhythm.   Pulmonary/Chest: Effort normal and breath sounds normal. No respiratory distress. She has no wheezes. She has no rales.  Abdominal: Soft. She exhibits no distension and no mass. There is tenderness. There is no rebound and no guarding.  Diffuse tenderness throughout abdomen   Genitourinary:  foamy white discharge in the vagina, tenderness in the vagina.  No focal tenderness on bimanual.  No CMT.    Neurological: She is alert.  Skin: She is not diaphoretic.  Nursing note and vitals reviewed.   ED Course   Procedures (including critical care time) Labs Review Labs Reviewed  WET PREP, GENITAL - Abnormal; Notable for the following:    Clue Cells Wet Prep HPF POC PRESENT (*)    WBC, Wet Prep HPF POC MANY (*)    All other components within normal limits  COMPREHENSIVE METABOLIC PANEL - Abnormal; Notable for the following:    Glucose, Bld 109 (*)    All other components within normal limits  CBC - Abnormal; Notable for the following:    RBC 5.54 (*)    MCV 69.0 (*)    MCH 21.7 (*)    All other components within normal limits  LIPASE, BLOOD  URINALYSIS, ROUTINE W REFLEX MICROSCOPIC (NOT AT [facility])  RPR  HIV ANTIBODY (ROUTINE TESTING)  GC/CHLAMYDIA PROBE AMP ([place]) NOT AT [facility]    Imaging Review No results found. I have personally reviewed and evaluated these images and lab results as part of my medical decision-making.   EKG Interpretation None      MDM   Final diagnoses:  Bacterial vaginosis  Generalized abdominal pain   Afebrile, nontoxic patient with diffuse abdominal pain, abnormal vaginal discharge. Labwork unremarkable. Abdominal exam is nonsurgical.  Did not provide urine for testing.  Pt declines empiric STD treatment.  Has been eager to leave the department since I first so her and very frustrated with how long everything was taking, impatient to be discharged. Agreed to stay for wet prep but declined to stay longer for any further testing.   D/C home with flagyl, motrin, PCP follow up.  Discussed result, findings, treatment, and follow up  with patient.  Pt given return precautions.  Pt verbalizes understanding and agrees with plan.         Clayton Bibles, PA-C 12/26/15 1513  Malvin Johns, MD 12/26/15 [phone]

## 2015-12-27 ENCOUNTER — Ambulatory Visit (INDEPENDENT_AMBULATORY_CARE_PROVIDER_SITE_OTHER): Payer: Medicaid Other | Admitting: Cardiology

## 2015-12-27 ENCOUNTER — Encounter: Payer: Self-pay | Admitting: Cardiology

## 2015-12-27 VITALS — BP 174/109 | HR 69 | Ht 66.0 in | Wt 130.2 lb

## 2015-12-27 DIAGNOSIS — E785 Hyperlipidemia, unspecified: Secondary | ICD-10-CM | POA: Diagnosis not present

## 2015-12-27 DIAGNOSIS — Z79899 Other long term (current) drug therapy: Secondary | ICD-10-CM

## 2015-12-27 DIAGNOSIS — I25810 Atherosclerosis of coronary artery bypass graft(s) without angina pectoris: Secondary | ICD-10-CM

## 2015-12-27 LAB — RPR, QUANT+TP ABS (REFLEX): TREPONEMA PALLIDUM AB: POSITIVE — AB

## 2015-12-27 LAB — GC/CHLAMYDIA PROBE AMP (~~LOC~~) NOT AT ARMC
Chlamydia: NEGATIVE
Neisseria Gonorrhea: NEGATIVE

## 2015-12-27 LAB — HIV ANTIBODY (ROUTINE TESTING W REFLEX): HIV SCREEN 4TH GENERATION: NONREACTIVE

## 2015-12-27 LAB — RPR: RPR Ser Ql: REACTIVE — AB

## 2015-12-27 MED ORDER — CLONIDINE HCL 0.1 MG PO TABS: 0.1000 mg | ORAL_TABLET | Freq: Two times a day (BID) | ORAL | Status: DC

## 2015-12-27 NOTE — Patient Instructions (Signed)
Medication Instructions:  STOP Clonidine patch and START Clonidine 0.1 mg twice a day  Labwork: Fasting Lipid Liver  Testing/Procedures: NONE  Follow-Up: 2 Weeks with [name] Blood Pressure check  Any Other Special Instructions Will Be Listed Below (If Applicable).   If you need a refill on your cardiac medications before your next appointment, please call your pharmacy.

## 2015-12-28 ENCOUNTER — Telehealth (HOSPITAL_BASED_OUTPATIENT_CLINIC_OR_DEPARTMENT_OTHER): Payer: Self-pay | Admitting: Emergency Medicine

## 2015-12-28 NOTE — Telephone Encounter (Signed)
Kimberly [name], state health dept calling and made aware, stated that no followup needed since history of same on 07/04/11

## 2016-01-03 ENCOUNTER — Telehealth: Payer: Self-pay | Admitting: Cardiology

## 2016-01-03 NOTE — Telephone Encounter (Signed)
Called patient to schedule 2 week fu blood pressure check with [name] per Dr. Percival Spanish.  Patient stated that she could not get her and was not going to come all the way here for a blood pressure check.  Her daughter comes up from [place] to take her to appts and that's "too far to come for a check with a pharmacist". She indicated that she explained this to Dr. Percival Spanish when she was here. Stated to patient that I would share with Dr. Percival Spanish.

## 2016-01-13 ENCOUNTER — Telehealth: Payer: Self-pay | Admitting: *Deleted

## 2016-01-13 DIAGNOSIS — I10 Essential (primary) hypertension: Secondary | ICD-10-CM

## 2016-01-13 MED ORDER — VERAPAMIL HCL ER 240 MG PO TBCR: 240.0000 mg | EXTENDED_RELEASE_TABLET | Freq: Every day | ORAL | Status: DC

## 2016-01-13 MED ORDER — CLOPIDOGREL BISULFATE 75 MG PO TABS: 75.0000 mg | ORAL_TABLET | Freq: Every day | ORAL | Status: DC

## 2016-01-13 MED ORDER — QUINAPRIL HCL 40 MG PO TABS: 40.0000 mg | ORAL_TABLET | Freq: Every day | ORAL | Status: DC

## 2016-01-13 MED ORDER — METOPROLOL SUCCINATE ER 25 MG PO TB24
75.0000 mg | ORAL_TABLET | Freq: Every day | ORAL | Status: DC
Start: 2016-01-13 — End: 2016-09-22

## 2016-01-13 MED ORDER — CLONIDINE HCL 0.1 MG PO TABS: 0.1000 mg | ORAL_TABLET | Freq: Two times a day (BID) | ORAL | Status: DC

## 2016-01-13 MED ORDER — ROSUVASTATIN CALCIUM 10 MG PO TABS: ORAL_TABLET | ORAL | Status: DC

## 2016-01-13 NOTE — Telephone Encounter (Signed)
Patient was direct call into triage requesting medication refills. Sent all cardiac medications to her pharmacy. Pt is appreciative for the assistance.

## 2016-03-21 ENCOUNTER — Other Ambulatory Visit: Payer: Self-pay | Admitting: *Deleted

## 2016-04-13 ENCOUNTER — Other Ambulatory Visit: Payer: Self-pay | Admitting: Cardiology

## 2016-04-13 DIAGNOSIS — I10 Essential (primary) hypertension: Secondary | ICD-10-CM

## 2016-05-20 ENCOUNTER — Other Ambulatory Visit: Payer: Self-pay | Admitting: Cardiology

## 2016-05-20 DIAGNOSIS — I10 Essential (primary) hypertension: Secondary | ICD-10-CM

## 2016-05-23 ENCOUNTER — Other Ambulatory Visit: Payer: Self-pay

## 2016-05-23 DIAGNOSIS — I10 Essential (primary) hypertension: Secondary | ICD-10-CM

## 2016-05-23 MED ORDER — QUINAPRIL HCL 40 MG PO TABS: 40.0000 mg | ORAL_TABLET | Freq: Every day | ORAL | 0 refills | Status: DC

## 2016-07-18 ENCOUNTER — Other Ambulatory Visit: Payer: Self-pay | Admitting: Cardiology

## 2016-07-18 DIAGNOSIS — I10 Essential (primary) hypertension: Secondary | ICD-10-CM

## 2016-07-19 NOTE — Telephone Encounter (Signed)
Rx(s) sent to pharmacy electronically.  

## 2016-09-21 ENCOUNTER — Other Ambulatory Visit: Payer: Self-pay | Admitting: Cardiology

## 2016-09-22 NOTE — Telephone Encounter (Signed)
Rx(s) sent to pharmacy electronically.  

## 2016-09-25 ENCOUNTER — Telehealth: Payer: Self-pay | Admitting: Cardiology

## 2016-09-25 ENCOUNTER — Telehealth: Payer: Self-pay | Admitting: *Deleted

## 2016-09-25 NOTE — Telephone Encounter (Signed)
New Message      *STAT* If patient is at the pharmacy, call can be transferred to refill team.   1. Which medications need to be refilled? (please list name of each medication and dose if known)  quinapril (ACCUPRIL) 40 MG tablet Take 1 tablet (40 mg total) by mouth daily.     2. Which pharmacy/location (including street and city if local pharmacy) is medication to be sent to?[facility] on [address]   3. Do they need a 30 day or 90 day supply? [place]

## 2016-09-25 NOTE — Telephone Encounter (Signed)
Priority Sent On From To Message Type   09/25/2016 3:14 PM Roberts Gaudy, CMA Vennie Homans Patient Calls   P Cv Div Nl Clinical Pool    Comment: call pt please, she had called again, i see two phone notes in progress but pt would like a phone call, thanks    Per Theadora Rama, CMA documentation - patient called in again Also phone note from operator with same request  LM for patient to call

## 2016-09-25 NOTE — Telephone Encounter (Signed)
Follow up   Pt called again , calling about prescription not being called in  quinapril (ACCUPRIL) 40 MG tablet Take 1 tablet (40 mg total) by mouth daily

## 2016-09-25 NOTE — Telephone Encounter (Signed)
LMTCB

## 2016-09-25 NOTE — Telephone Encounter (Signed)
Duplicate encounter regarding same issue. This encounter will be closed.

## 2016-09-25 NOTE — Telephone Encounter (Signed)
Medication Detail    Disp Refills Start End   quinapril (ACCUPRIL) 40 MG tablet 90 tablet 1 07/19/2016    Sig - Route: Take 1 tablet (40 mg total) by mouth daily. - Oral   E-Prescribing Status: Receipt confirmed by pharmacy (07/19/2016 2:21 PM EST)   Associated Diagnoses   Essential hypertension     Pharmacy   [id number] - [place], [place] - [address] AT [address]   * above documentation shows refill for 6 month supply in Dec 2017

## 2016-09-25 NOTE — Telephone Encounter (Signed)
Pt calling requesting a refill on Quinapril 40 mg tablet. Please advise

## 2016-09-25 NOTE — Telephone Encounter (Signed)
call pt please, she had called again, i see two phone notes in progress but pt would like a phone call, thanks

## 2016-09-25 NOTE — Telephone Encounter (Signed)
F/u Message   Pt call to fu/ on prescription for Quinapril. Please call back to discuss

## 2016-09-25 NOTE — Telephone Encounter (Signed)
Spoke to patient. She states the pharmacy told her that she needs "approval" from Dr. Percival Spanish for the Rx. Explained that insurance can dictate which meds are preferred - and she is on Medicaid now - quinapril may not be on preferred list. Patient states she needs her quinapril. Offered to call [facility] to request short supply of medication while PA is being completed for her quinapril.   Call made to [facility] pharmacy on patient's behalf - patient can purchase quinapril from pharmacy  Pharmacist states they have faxed request for PA since December  Will send to [place] to work on [place]

## 2016-09-25 NOTE — Telephone Encounter (Signed)
Duplicate encounter regarding same issue/refill. This encounter will be closed.

## 2016-09-26 NOTE — Telephone Encounter (Signed)
PA call into [place] track, have to wait 24 hr for appoval

## 2016-09-27 ENCOUNTER — Telehealth: Payer: Self-pay | Admitting: Cardiology

## 2016-09-27 NOTE — Telephone Encounter (Signed)
Follow up    Pt stating her phone is not going through properly pharmacy stating you needs to fax approval, needs to know what is going on with her medication

## 2016-09-27 NOTE — Telephone Encounter (Signed)
PA for Quinapril 40 mg was approve for 1 year, pharmacist made aware of approval

## 2016-09-27 NOTE — Telephone Encounter (Signed)
Returned call to patient she stated she needed a prior authorization for Quinapril.Stated she has been taking since her heart surgery and has been working well.Advised I will send message to Dr.Hochrein's nurse Nya.

## 2016-10-03 NOTE — Telephone Encounter (Signed)
PA was approved for quinapril, pharmacist made aware

## 2016-10-17 NOTE — Telephone Encounter (Signed)
Closed Encounter  °
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
## 2016-12-14 ENCOUNTER — Encounter: Payer: Self-pay | Admitting: Cardiology

## 2016-12-14 ENCOUNTER — Ambulatory Visit (INDEPENDENT_AMBULATORY_CARE_PROVIDER_SITE_OTHER): Payer: Medicaid Other | Admitting: Cardiology

## 2016-12-14 VITALS — BP 150/98 | HR 59 | Ht 66.0 in | Wt 123.0 lb

## 2016-12-14 DIAGNOSIS — E785 Hyperlipidemia, unspecified: Secondary | ICD-10-CM | POA: Diagnosis not present

## 2016-12-14 DIAGNOSIS — Z79899 Other long term (current) drug therapy: Secondary | ICD-10-CM

## 2016-12-14 DIAGNOSIS — M79672 Pain in left foot: Secondary | ICD-10-CM

## 2016-12-14 DIAGNOSIS — I2510 Atherosclerotic heart disease of native coronary artery without angina pectoris: Secondary | ICD-10-CM

## 2016-12-14 DIAGNOSIS — M79671 Pain in right foot: Secondary | ICD-10-CM | POA: Diagnosis not present

## 2016-12-14 MED ORDER — COLCHICINE 0.6 MG PO TABS: 0.6000 mg | ORAL_TABLET | Freq: Two times a day (BID) | ORAL | 0 refills | Status: DC

## 2016-12-14 MED ORDER — ENSURE COMPLETE PO LIQD: 237.0000 mL | Freq: Two times a day (BID) | ORAL | 1 refills | Status: DC

## 2016-12-14 NOTE — Patient Instructions (Signed)
Medication Instructions:  START- Colchicine 0.6 mg twice a day, This Medications is to be refill by your PCP  Labwork: BMP and Fasting Lipids  Testing/Procedures: Your physician has requested that you have an ankle brachial index (ABI). During this test an ultrasound and blood pressure cuff are used to evaluate the arteries that supply the arms and legs with blood. Allow thirty minutes for this exam. There are no restrictions or special instructions.  Follow-Up: Your physician wants you to follow-up in: 1 Year. You will receive a reminder letter in the mail two months in advance. If you don't receive a letter, please call our office to schedule the follow-up appointment.   Any Other Special Instructions Will Be Listed Below (If Applicable).   If you need a refill on your cardiac medications before your next appointment, please call your pharmacy.

## 2016-12-18 ENCOUNTER — Other Ambulatory Visit: Payer: Self-pay

## 2016-12-18 NOTE — Addendum Note (Signed)
Addended by: Vennie Homans on: 12/18/2016 04:51 PM   Modules accepted: Orders

## 2017-01-09 ENCOUNTER — Inpatient Hospital Stay (HOSPITAL_COMMUNITY): Admission: RE | Admit: 2017-01-09 | Payer: Medicaid Other | Source: Ambulatory Visit

## 2017-01-16 ENCOUNTER — Other Ambulatory Visit: Payer: Self-pay | Admitting: *Deleted

## 2017-01-16 MED ORDER — VERAPAMIL HCL ER 240 MG PO TBCR: 240.0000 mg | EXTENDED_RELEASE_TABLET | Freq: Every day | ORAL | 3 refills | Status: DC

## 2017-01-17 ENCOUNTER — Other Ambulatory Visit: Payer: Self-pay

## 2017-01-17 MED ORDER — CLOPIDOGREL BISULFATE 75 MG PO TABS: 75.0000 mg | ORAL_TABLET | Freq: Every day | ORAL | 3 refills | Status: DC

## 2017-01-23 ENCOUNTER — Telehealth: Payer: Self-pay | Admitting: Cardiology

## 2017-01-23 MED ORDER — ENSURE COMPLETE PO LIQD: 237.0000 mL | Freq: Two times a day (BID) | ORAL | 1 refills | Status: DC

## 2017-01-23 NOTE — Telephone Encounter (Signed)
Spoke with pt, script [facility] to [facility] for ensure.

## 2017-01-23 NOTE — Telephone Encounter (Signed)
New message    Pt is calling asking for a call back from RN. She said it is about her medication.

## 2017-01-24 ENCOUNTER — Other Ambulatory Visit: Payer: Self-pay

## 2017-01-24 ENCOUNTER — Telehealth: Payer: Self-pay | Admitting: Cardiology

## 2017-01-24 MED ORDER — ROSUVASTATIN CALCIUM 10 MG PO TABS: ORAL_TABLET | ORAL | 3 refills | Status: DC

## 2017-01-24 NOTE — Telephone Encounter (Signed)
Returned call to patient. She states she would like a refill of a Rx that prevents her from going to the bathroom at night. Advised there are no medications active on her list that have this mechanism of action and there are none in med history that do this either. Advised that since she does not know the name of the medication, she should contact primary care for advice/Rx. She voiced understanding.

## 2017-01-24 NOTE — Telephone Encounter (Signed)
New message    Pt is calling asking for nurse to call. She said she needs her medicine called in but she didn't know the name of it. She said it is so she doesn't get up during the night to go to the restroom.

## 2017-01-30 ENCOUNTER — Telehealth: Payer: Self-pay | Admitting: Cardiology

## 2017-01-30 MED ORDER — ENSURE COMPLETE PO LIQD: 237.0000 mL | Freq: Two times a day (BID) | ORAL | 5 refills | Status: DC

## 2017-01-30 NOTE — Telephone Encounter (Signed)
Pt is getting ready to move to another apartment. She needs a letter stating that she needs a handicap rail at her apartment. She also wants to know if the letter was sent to [facility] so she could get her Ensure?[name]

## 2017-01-30 NOTE — Telephone Encounter (Signed)
Rx for Ensure sent to [facility] as requested   Will forward to Dr Percival Spanish for review regarding letter for handicap rail.

## 2017-01-31 NOTE — Telephone Encounter (Signed)
OK to write a letter for a handicap rale.

## 2017-02-01 ENCOUNTER — Encounter: Payer: Self-pay | Admitting: *Deleted

## 2017-02-01 NOTE — Telephone Encounter (Signed)
Letter generated and placed into the mail to the patient.

## 2017-02-01 NOTE — Telephone Encounter (Signed)
Pt would like for you to mail her letter to her when it is completed.

## 2017-02-01 NOTE — Telephone Encounter (Signed)
Patient notified that handrail letter is complete.  Called pharmacy to inquire about Ensure letter. Pharmacy staff said that since Ensure is over-the-counter, it won't be covered by insurance. Patient notified.

## 2017-02-15 ENCOUNTER — Telehealth: Payer: Self-pay | Admitting: Cardiology

## 2017-02-15 NOTE — Telephone Encounter (Signed)
Spoke with pharmacy they will reill verapamil, Crestor and Plavix. Pt must have tried to refill with old #

## 2017-02-15 NOTE — Telephone Encounter (Signed)
°*  STAT* If patient is at the pharmacy, call can be transferred to refill team.   1. Which medications need to be refilled? (please list name of each medication and dose if known) Accupril 40 mg, Verapamil 240 mg and "Long pink pills" (cannot remember name)   2. Which pharmacy/location (including street and city if local pharmacy) is medication to be sent to? [facility] - [place], [place] AT [address] [place]  3. Do they need a 30 day or 90 day supply? 90  Patient is currently out of medication

## 2017-02-27 ENCOUNTER — Telehealth: Payer: Self-pay | Admitting: Cardiology

## 2017-02-27 DIAGNOSIS — I10 Essential (primary) hypertension: Secondary | ICD-10-CM

## 2017-02-27 MED ORDER — CLONIDINE HCL 0.1 MG PO TABS: 0.1000 mg | ORAL_TABLET | Freq: Two times a day (BID) | ORAL | 3 refills | Status: DC

## 2017-02-27 MED ORDER — QUINAPRIL HCL 40 MG PO TABS: 40.0000 mg | ORAL_TABLET | Freq: Every day | ORAL | 3 refills | Status: DC

## 2017-02-27 NOTE — Telephone Encounter (Signed)
Spoke with patient, requesting refills on "all of her medications", reports she has not had any medications for 4 days.   Per chart review, all prescriptions have been sent in June 2018 (to correct pharmacy), new rx sent for clonidine and quinapril.    Advised pharmacy should have prescriptions for all cardiac medication, advised to have them call if there is any questions.   Also advised to restart medication regimen asap.  Patient verbalized understanding.

## 2017-02-27 NOTE — Telephone Encounter (Signed)
New message     Pt is calling about some problems the pharmacy was having with her medications. She said she is not home right now and can not remember all the medication

## 2017-03-19 ENCOUNTER — Other Ambulatory Visit: Payer: Self-pay | Admitting: *Deleted

## 2017-03-19 MED ORDER — CLOPIDOGREL BISULFATE 75 MG PO TABS: 75.0000 mg | ORAL_TABLET | Freq: Every day | ORAL | 2 refills | Status: DC

## 2017-03-19 MED ORDER — ROSUVASTATIN CALCIUM 10 MG PO TABS: ORAL_TABLET | ORAL | 2 refills | Status: DC

## 2017-03-22 ENCOUNTER — Other Ambulatory Visit: Payer: Self-pay | Admitting: *Deleted

## 2017-03-22 MED ORDER — CLOPIDOGREL BISULFATE 75 MG PO TABS: 75.0000 mg | ORAL_TABLET | Freq: Every day | ORAL | 2 refills | Status: DC

## 2017-03-27 ENCOUNTER — Other Ambulatory Visit: Payer: Self-pay

## 2017-03-27 DIAGNOSIS — I10 Essential (primary) hypertension: Secondary | ICD-10-CM

## 2017-03-27 MED ORDER — CLOPIDOGREL BISULFATE 75 MG PO TABS: 75.0000 mg | ORAL_TABLET | Freq: Every day | ORAL | 2 refills | Status: DC

## 2017-03-27 MED ORDER — ROSUVASTATIN CALCIUM 10 MG PO TABS: ORAL_TABLET | ORAL | 2 refills | Status: DC

## 2017-03-27 MED ORDER — QUINAPRIL HCL 40 MG PO TABS: 40.0000 mg | ORAL_TABLET | Freq: Every day | ORAL | 2 refills | Status: DC

## 2017-03-27 MED ORDER — METOPROLOL SUCCINATE ER 100 MG PO TB24: ORAL_TABLET | ORAL | 2 refills | Status: DC

## 2017-03-27 MED ORDER — VERAPAMIL HCL ER 240 MG PO TBCR: 240.0000 mg | EXTENDED_RELEASE_TABLET | Freq: Every day | ORAL | 2 refills | Status: DC

## 2017-03-27 NOTE — Telephone Encounter (Signed)
Patient called office for demanding refills because she been out of her meds for 2 weeks. I tolded her I would send her meds over to [facility] per her request. Patient voiced understanding. Rxs sent to pharmacy.

## 2017-05-14 ENCOUNTER — Telehealth: Payer: Self-pay | Admitting: Cardiology

## 2017-05-14 NOTE — Telephone Encounter (Signed)
She has had CABG and this would not, by itself, qualify her for disability.  My letter would not be particularly helpful.

## 2017-05-14 NOTE — Telephone Encounter (Signed)
Lm2cb 

## 2017-05-14 NOTE — Telephone Encounter (Signed)
Kimberly [name] is calling to find out if Dr. Percival Spanish can write a letter for her , so that she can try to get disability . Please call

## 2017-05-17 NOTE — Telephone Encounter (Signed)
Left message

## 2017-05-17 NOTE — Telephone Encounter (Signed)
She had open heart surgery in 2008 and then 3 months after that had a stroke and has bad gout in her left foot. These health conditions (CABG, stroke) occurred >10 years ago. She sees a internal medicine doctor who treats her gout. Advised that she reach out to her other health care providers if she feels her other health conditions make her feel like she should get disability but from a cardiac standpoint, we cannot provide a disability letter.

## 2017-07-18 ENCOUNTER — Telehealth: Payer: Self-pay | Admitting: *Deleted

## 2017-07-18 NOTE — Telephone Encounter (Signed)
Spoke with pt, she wants dr hochrein to write her a letter to excuse her from jury duty. She does not know the date or any other information needed to generate a letter. She will have someone bring the letter by the office so we can get the information. Will forward to dr hochrein for okay to generate letter.

## 2017-07-18 NOTE — Telephone Encounter (Signed)
Fine

## 2017-08-29 ENCOUNTER — Encounter: Payer: Self-pay | Admitting: *Deleted

## 2017-08-30 ENCOUNTER — Telehealth: Payer: Self-pay | Admitting: *Deleted

## 2017-09-03 NOTE — Telephone Encounter (Signed)
Pt already received letter written by Fredia Beets

## 2017-10-23 ENCOUNTER — Other Ambulatory Visit: Payer: Self-pay

## 2017-10-23 DIAGNOSIS — I10 Essential (primary) hypertension: Secondary | ICD-10-CM

## 2017-10-23 MED ORDER — QUINAPRIL HCL 40 MG PO TABS: 40.0000 mg | ORAL_TABLET | Freq: Every day | ORAL | 2 refills | Status: DC

## 2017-12-04 ENCOUNTER — Telehealth: Payer: Self-pay | Admitting: Cardiology

## 2017-12-04 DIAGNOSIS — I10 Essential (primary) hypertension: Secondary | ICD-10-CM

## 2017-12-04 MED ORDER — QUINAPRIL HCL 40 MG PO TABS: 40.0000 mg | ORAL_TABLET | Freq: Every day | ORAL | 0 refills | Status: DC

## 2017-12-04 NOTE — Telephone Encounter (Signed)
New message    *STAT* If patient is at the pharmacy, call can be transferred to refill team.   1. Which medications need to be refilled? (please list name of each medication and dose if known) quinapril (ACCUPRIL) 40 MG tablet  2. Which pharmacy/location (including street and city if local pharmacy) is medication to be sent to?[facility] - [place], [place] AT [address]  3. Do they need a 30 day or 90 day supply? [place]

## 2017-12-18 ENCOUNTER — Telehealth: Payer: Self-pay | Admitting: Cardiology

## 2017-12-18 DIAGNOSIS — I10 Essential (primary) hypertension: Secondary | ICD-10-CM

## 2017-12-18 MED ORDER — QUINAPRIL HCL 40 MG PO TABS
40.0000 mg | ORAL_TABLET | Freq: Every day | ORAL | 0 refills | Status: DC
Start: 2017-12-18 — End: 2018-04-11

## 2017-12-18 NOTE — Telephone Encounter (Signed)
New Message   *STAT* If patient is at the pharmacy, call can be transferred to refill team.   1. Which medications need to be refilled? (please list name of each medication and dose if known) quinapril (ACCUPRIL) 40 MG tablet  2. Which pharmacy/location (including street and city if local pharmacy) is medication to be sent to? [facility] - [place], [place] AT [address]  3. Do they need a 30 day or 90 day supply? 30  Pt states that she has been trying to get a refill for the past month and the pharmacy keeps saying that they never received it

## 2018-02-13 ENCOUNTER — Emergency Department (HOSPITAL_COMMUNITY): Payer: Medicaid Other

## 2018-02-13 ENCOUNTER — Other Ambulatory Visit: Payer: Self-pay

## 2018-02-13 ENCOUNTER — Inpatient Hospital Stay (HOSPITAL_COMMUNITY)
Admission: EM | Admit: 2018-02-13 | Discharge: 2018-02-18 | DRG: 064 | Disposition: A | Discharge status: Skilled Nursing Facility | Payer: Medicaid Other | Attending: Internal Medicine | Admitting: Internal Medicine

## 2018-02-13 ENCOUNTER — Encounter (HOSPITAL_COMMUNITY): Payer: Self-pay | Admitting: Emergency Medicine

## 2018-02-13 DIAGNOSIS — E785 Hyperlipidemia, unspecified: Secondary | ICD-10-CM | POA: Diagnosis present

## 2018-02-13 DIAGNOSIS — I63431 Cerebral infarction due to embolism of right posterior cerebral artery: Secondary | ICD-10-CM | POA: Diagnosis not present

## 2018-02-13 DIAGNOSIS — Z8249 Family history of ischemic heart disease and other diseases of the circulatory system: Secondary | ICD-10-CM

## 2018-02-13 DIAGNOSIS — R001 Bradycardia, unspecified: Secondary | ICD-10-CM | POA: Diagnosis not present

## 2018-02-13 DIAGNOSIS — I69392 Facial weakness following cerebral infarction: Secondary | ICD-10-CM

## 2018-02-13 DIAGNOSIS — Z87891 Personal history of nicotine dependence: Secondary | ICD-10-CM

## 2018-02-13 DIAGNOSIS — I25708 Atherosclerosis of coronary artery bypass graft(s), unspecified, with other forms of angina pectoris: Secondary | ICD-10-CM | POA: Diagnosis present

## 2018-02-13 DIAGNOSIS — I422 Other hypertrophic cardiomyopathy: Secondary | ICD-10-CM | POA: Diagnosis present

## 2018-02-13 DIAGNOSIS — W19XXXA Unspecified fall, initial encounter: Secondary | ICD-10-CM

## 2018-02-13 DIAGNOSIS — Z681 Body mass index (BMI) 19 or less, adult: Secondary | ICD-10-CM | POA: Diagnosis not present

## 2018-02-13 DIAGNOSIS — Z8673 Personal history of transient ischemic attack (TIA), and cerebral infarction without residual deficits: Secondary | ICD-10-CM | POA: Diagnosis present

## 2018-02-13 DIAGNOSIS — R29706 NIHSS score 6: Secondary | ICD-10-CM | POA: Diagnosis present

## 2018-02-13 DIAGNOSIS — F1290 Cannabis use, unspecified, uncomplicated: Secondary | ICD-10-CM | POA: Diagnosis present

## 2018-02-13 DIAGNOSIS — R296 Repeated falls: Secondary | ICD-10-CM | POA: Diagnosis present

## 2018-02-13 DIAGNOSIS — I25810 Atherosclerosis of coronary artery bypass graft(s) without angina pectoris: Secondary | ICD-10-CM | POA: Diagnosis present

## 2018-02-13 DIAGNOSIS — W1830XA Fall on same level, unspecified, initial encounter: Secondary | ICD-10-CM | POA: Diagnosis present

## 2018-02-13 DIAGNOSIS — M25512 Pain in left shoulder: Secondary | ICD-10-CM | POA: Diagnosis present

## 2018-02-13 DIAGNOSIS — E876 Hypokalemia: Secondary | ICD-10-CM | POA: Diagnosis present

## 2018-02-13 DIAGNOSIS — I959 Hypotension, unspecified: Secondary | ICD-10-CM | POA: Diagnosis not present

## 2018-02-13 DIAGNOSIS — M109 Gout, unspecified: Secondary | ICD-10-CM | POA: Diagnosis present

## 2018-02-13 DIAGNOSIS — I700 Atherosclerosis of aorta: Secondary | ICD-10-CM | POA: Diagnosis present

## 2018-02-13 DIAGNOSIS — I480 Paroxysmal atrial fibrillation: Secondary | ICD-10-CM

## 2018-02-13 DIAGNOSIS — I712 Thoracic aortic aneurysm, without rupture: Secondary | ICD-10-CM | POA: Diagnosis present

## 2018-02-13 DIAGNOSIS — I340 Nonrheumatic mitral (valve) insufficiency: Secondary | ICD-10-CM | POA: Diagnosis not present

## 2018-02-13 DIAGNOSIS — I361 Nonrheumatic tricuspid (valve) insufficiency: Secondary | ICD-10-CM | POA: Diagnosis not present

## 2018-02-13 DIAGNOSIS — M545 Low back pain: Secondary | ICD-10-CM | POA: Diagnosis present

## 2018-02-13 DIAGNOSIS — Z7982 Long term (current) use of aspirin: Secondary | ICD-10-CM

## 2018-02-13 DIAGNOSIS — I10 Essential (primary) hypertension: Secondary | ICD-10-CM | POA: Diagnosis present

## 2018-02-13 DIAGNOSIS — G9341 Metabolic encephalopathy: Secondary | ICD-10-CM | POA: Diagnosis present

## 2018-02-13 DIAGNOSIS — M25551 Pain in right hip: Secondary | ICD-10-CM

## 2018-02-13 DIAGNOSIS — I77810 Thoracic aortic ectasia: Secondary | ICD-10-CM | POA: Diagnosis not present

## 2018-02-13 DIAGNOSIS — I639 Cerebral infarction, unspecified: Secondary | ICD-10-CM | POA: Diagnosis not present

## 2018-02-13 DIAGNOSIS — I6523 Occlusion and stenosis of bilateral carotid arteries: Secondary | ICD-10-CM

## 2018-02-13 DIAGNOSIS — H53462 Homonymous bilateral field defects, left side: Secondary | ICD-10-CM | POA: Diagnosis present

## 2018-02-13 DIAGNOSIS — I6350 Cerebral infarction due to unspecified occlusion or stenosis of unspecified cerebral artery: Secondary | ICD-10-CM | POA: Diagnosis present

## 2018-02-13 DIAGNOSIS — I2510 Atherosclerotic heart disease of native coronary artery without angina pectoris: Secondary | ICD-10-CM

## 2018-02-13 DIAGNOSIS — Z951 Presence of aortocoronary bypass graft: Secondary | ICD-10-CM

## 2018-02-13 DIAGNOSIS — E440 Moderate protein-calorie malnutrition: Secondary | ICD-10-CM | POA: Diagnosis present

## 2018-02-13 DIAGNOSIS — I63531 Cerebral infarction due to unspecified occlusion or stenosis of right posterior cerebral artery: Secondary | ICD-10-CM

## 2018-02-13 DIAGNOSIS — I63012 Cerebral infarction due to thrombosis of left vertebral artery: Secondary | ICD-10-CM | POA: Diagnosis not present

## 2018-02-13 DIAGNOSIS — Z9114 Patient's other noncompliance with medication regimen: Secondary | ICD-10-CM

## 2018-02-13 DIAGNOSIS — Z7902 Long term (current) use of antithrombotics/antiplatelets: Secondary | ICD-10-CM

## 2018-02-13 DIAGNOSIS — Z888 Allergy status to other drugs, medicaments and biological substances status: Secondary | ICD-10-CM

## 2018-02-13 DIAGNOSIS — M549 Dorsalgia, unspecified: Secondary | ICD-10-CM

## 2018-02-13 DIAGNOSIS — I69354 Hemiplegia and hemiparesis following cerebral infarction affecting left non-dominant side: Secondary | ICD-10-CM

## 2018-02-13 DIAGNOSIS — I6300 Cerebral infarction due to thrombosis of unspecified precerebral artery: Secondary | ICD-10-CM | POA: Diagnosis not present

## 2018-02-13 DIAGNOSIS — M25552 Pain in left hip: Secondary | ICD-10-CM

## 2018-02-13 DIAGNOSIS — I4891 Unspecified atrial fibrillation: Secondary | ICD-10-CM | POA: Diagnosis present

## 2018-02-13 DIAGNOSIS — R7989 Other specified abnormal findings of blood chemistry: Secondary | ICD-10-CM

## 2018-02-13 DIAGNOSIS — Z79899 Other long term (current) drug therapy: Secondary | ICD-10-CM

## 2018-02-13 DIAGNOSIS — Z9119 Patient's noncompliance with other medical treatment and regimen: Secondary | ICD-10-CM

## 2018-02-13 HISTORY — DX: Unspecified fall, initial encounter: W19.XXXA

## 2018-02-13 LAB — URINALYSIS, ROUTINE W REFLEX MICROSCOPIC
BACTERIA UA: NONE SEEN
Bilirubin Urine: NEGATIVE
Glucose, UA: NEGATIVE mg/dL
KETONES UR: 5 mg/dL — AB
Leukocytes, UA: NEGATIVE
Nitrite: NEGATIVE
Protein, ur: 100 mg/dL — AB
SPECIFIC GRAVITY, URINE: 1.014 (ref 1.005–1.030)
pH: 5 (ref 5.0–8.0)

## 2018-02-13 LAB — CBC WITH DIFFERENTIAL/PLATELET
Abs Immature Granulocytes: 0 10*3/uL (ref 0.0–0.1)
BASOS PCT: 1 %
Basophils Absolute: 0.1 10*3/uL (ref 0.0–0.1)
EOS ABS: 0 10*3/uL (ref 0.0–0.7)
EOS PCT: 0 %
HCT: 40.3 % (ref 36.0–46.0)
Hemoglobin: 12.4 g/dL (ref 12.0–15.0)
Immature Granulocytes: 0 %
Lymphocytes Relative: 25 %
Lymphs Abs: 2.1 10*3/uL (ref 0.7–4.0)
MCH: 21.8 pg — AB (ref 26.0–34.0)
MCHC: 30.8 g/dL (ref 30.0–36.0)
MCV: 70.8 fL — AB (ref 78.0–100.0)
MONO ABS: 0.5 10*3/uL (ref 0.1–1.0)
MONOS PCT: 6 %
NEUTROS PCT: 68 %
Neutro Abs: 5.7 10*3/uL (ref 1.7–7.7)
Platelets: 218 10*3/uL (ref 150–400)
RBC: 5.69 MIL/uL — ABNORMAL HIGH (ref 3.87–5.11)
RDW: 16 % — AB (ref 11.5–15.5)
WBC: 8.4 10*3/uL (ref 4.0–10.5)

## 2018-02-13 LAB — TROPONIN I
Troponin I: 0.08 ng/mL (ref <0.03)
Troponin I: 0.07 ng/mL (ref <0.03)

## 2018-02-13 LAB — RAPID URINE DRUG SCREEN, HOSP PERFORMED
Amphetamines: NOT DETECTED
BENZODIAZEPINES: NOT DETECTED
COCAINE: NOT DETECTED
OPIATES: POSITIVE — AB
Tetrahydrocannabinol: POSITIVE — AB

## 2018-02-13 LAB — COMPREHENSIVE METABOLIC PANEL
ALBUMIN: 4 g/dL (ref 3.5–5.0)
ALT: 27 U/L (ref 0–44)
ANION GAP: 14 (ref 5–15)
AST: 35 U/L (ref 15–41)
Alkaline Phosphatase: 81 U/L (ref 38–126)
BILIRUBIN TOTAL: 1 mg/dL (ref 0.3–1.2)
BUN: 13 mg/dL (ref 8–23)
CHLORIDE: 107 mmol/L (ref 98–111)
CO2: 23 mmol/L (ref 22–32)
Calcium: 9.9 mg/dL (ref 8.9–10.3)
Creatinine, Ser: 1.1 mg/dL — ABNORMAL HIGH (ref 0.44–1.00)
GFR calc Af Amer: >60 mL/min (ref >60)
GFR calc non Af Amer: 53 mL/min — ABNORMAL LOW (ref >60)
Glucose, Bld: 97 mg/dL (ref 70–99)
POTASSIUM: 3.5 mmol/L (ref 3.5–5.1)
Sodium: 144 mmol/L (ref 135–145)
TOTAL PROTEIN: 6.9 g/dL (ref 6.5–8.1)

## 2018-02-13 LAB — D-DIMER, QUANTITATIVE: D-Dimer, Quant: 0.84 ug/mL-FEU — ABNORMAL HIGH (ref 0.00–0.50)

## 2018-02-13 LAB — ETHANOL

## 2018-02-13 LAB — TSH: TSH: 1.245 u[IU]/mL (ref 0.350–4.500)

## 2018-02-13 LAB — I-STAT TROPONIN, ED: Troponin i, poc: 0.05 ng/mL (ref 0.00–0.08)

## 2018-02-13 MED ORDER — METOPROLOL TARTRATE 5 MG/5ML IV SOLN
5.0000 mg | Freq: Once | INTRAVENOUS | Status: AC
  Administered 2018-02-13: 5 mg via INTRAVENOUS
  Filled 2018-02-13: qty 5

## 2018-02-13 MED ORDER — DILTIAZEM LOAD VIA INFUSION
20.0000 mg | Freq: Once | INTRAVENOUS | Status: AC
  Administered 2018-02-13: 20 mg via INTRAVENOUS
  Filled 2018-02-13: qty 20

## 2018-02-13 MED ORDER — SENNOSIDES-DOCUSATE SODIUM 8.6-50 MG PO TABS: 1.0000 | ORAL_TABLET | Freq: Every evening | ORAL | Status: DC | PRN

## 2018-02-13 MED ORDER — METOPROLOL TARTRATE 5 MG/5ML IV SOLN: 2.5000 mg | Freq: Once | INTRAVENOUS | Status: DC

## 2018-02-13 MED ORDER — IOPAMIDOL (ISOVUE-370) INJECTION 76%
INTRAVENOUS | Status: AC
  Filled 2018-02-13: qty 100

## 2018-02-13 MED ORDER — MORPHINE SULFATE (PF) 4 MG/ML IV SOLN
4.0000 mg | Freq: Once | INTRAVENOUS | Status: AC
  Administered 2018-02-13: 4 mg via INTRAVENOUS
  Filled 2018-02-13: qty 1

## 2018-02-13 MED ORDER — SODIUM CHLORIDE 0.9 % IV SOLN
INTRAVENOUS | Status: DC
  Administered 2018-02-13 – 2018-02-15 (×4): via INTRAVENOUS

## 2018-02-13 MED ORDER — IOPAMIDOL (ISOVUE-370) INJECTION 76%: 100.0000 mL | Freq: Once | INTRAVENOUS | Status: DC | PRN

## 2018-02-13 MED ORDER — IOPAMIDOL (ISOVUE-370) INJECTION 76%
100.0000 mL | Freq: Once | INTRAVENOUS | Status: AC | PRN
  Administered 2018-02-13: 100 mL via INTRAVENOUS

## 2018-02-13 MED ORDER — VERAPAMIL HCL ER 240 MG PO TBCR
240.0000 mg | EXTENDED_RELEASE_TABLET | Freq: Every day | ORAL | Status: DC
  Administered 2018-02-13 – 2018-02-14 (×2): 240 mg via ORAL
  Filled 2018-02-13 (×2): qty 1

## 2018-02-13 MED ORDER — STROKE: EARLY STAGES OF RECOVERY BOOK
Freq: Once | Status: AC
  Administered 2018-02-13: 17:00:00

## 2018-02-13 MED ORDER — ENOXAPARIN SODIUM 40 MG/0.4ML ~~LOC~~ SOLN
40.0000 mg | SUBCUTANEOUS | Status: DC
  Administered 2018-02-14 – 2018-02-15 (×2): 40 mg via SUBCUTANEOUS
  Filled 2018-02-13 (×4): qty 0.4

## 2018-02-13 MED ORDER — CLONIDINE HCL 0.1 MG PO TABS
0.1000 mg | ORAL_TABLET | Freq: Two times a day (BID) | ORAL | Status: DC
  Administered 2018-02-14 (×2): 0.1 mg via ORAL
  Filled 2018-02-13 (×2): qty 1

## 2018-02-13 MED ORDER — QUINAPRIL HCL 10 MG PO TABS
40.0000 mg | ORAL_TABLET | Freq: Every day | ORAL | Status: DC
  Administered 2018-02-13 – 2018-02-14 (×2): 40 mg via ORAL
  Filled 2018-02-13 (×2): qty 4

## 2018-02-13 MED ORDER — ENSURE ENLIVE PO LIQD
237.0000 mL | Freq: Two times a day (BID) | ORAL | Status: DC
  Administered 2018-02-14 – 2018-02-15 (×3): 237 mL via ORAL
  Filled 2018-02-13 (×7): qty 237

## 2018-02-13 MED ORDER — ASPIRIN 81 MG PO CHEW
81.0000 mg | CHEWABLE_TABLET | Freq: Every day | ORAL | Status: DC
  Administered 2018-02-13 – 2018-02-14 (×2): 81 mg via ORAL
  Filled 2018-02-13 (×2): qty 1

## 2018-02-13 MED ORDER — ADULT MULTIVITAMIN W/MINERALS CH
1.0000 | ORAL_TABLET | Freq: Every day | ORAL | Status: DC
  Administered 2018-02-13 – 2018-02-18 (×5): 1 via ORAL
  Filled 2018-02-13 (×9): qty 1

## 2018-02-13 MED ORDER — CLOPIDOGREL BISULFATE 75 MG PO TABS
75.0000 mg | ORAL_TABLET | Freq: Every day | ORAL | Status: DC
  Administered 2018-02-13 – 2018-02-15 (×3): 75 mg via ORAL
  Filled 2018-02-13 (×4): qty 1

## 2018-02-13 MED ORDER — DILTIAZEM HCL-DEXTROSE 100-5 MG/100ML-% IV SOLN (PREMIX)
5.0000 mg/h | INTRAVENOUS | Status: DC
  Administered 2018-02-13: 5 mg/h via INTRAVENOUS
  Filled 2018-02-13: qty 100

## 2018-02-13 MED ORDER — METOPROLOL TARTRATE 25 MG PO TABS
25.0000 mg | ORAL_TABLET | Freq: Four times a day (QID) | ORAL | Status: DC
  Administered 2018-02-13 – 2018-02-14 (×2): 25 mg via ORAL
  Filled 2018-02-13 (×4): qty 1

## 2018-02-13 MED ORDER — ACETAMINOPHEN 325 MG PO TABS
650.0000 mg | ORAL_TABLET | ORAL | Status: DC | PRN
  Administered 2018-02-13 – 2018-02-15 (×5): 650 mg via ORAL
  Filled 2018-02-13 (×5): qty 2

## 2018-02-13 MED ORDER — ACETAMINOPHEN 160 MG/5ML PO SOLN: 650.0000 mg | ORAL | Status: DC | PRN

## 2018-02-13 MED ORDER — ACETAMINOPHEN 650 MG RE SUPP: 650.0000 mg | RECTAL | Status: DC | PRN

## 2018-02-13 MED ORDER — COLCHICINE 0.6 MG PO TABS
0.6000 mg | ORAL_TABLET | Freq: Two times a day (BID) | ORAL | Status: DC
  Administered 2018-02-13 – 2018-02-18 (×9): 0.6 mg via ORAL
  Filled 2018-02-13 (×12): qty 1

## 2018-02-13 MED ORDER — ONDANSETRON HCL 4 MG/2ML IJ SOLN
4.0000 mg | Freq: Once | INTRAMUSCULAR | Status: AC
  Administered 2018-02-13: 4 mg via INTRAVENOUS
  Filled 2018-02-13: qty 2

## 2018-02-13 MED ORDER — SODIUM CHLORIDE 0.9 % IV BOLUS
500.0000 mL | Freq: Once | INTRAVENOUS | Status: AC
  Administered 2018-02-13: 500 mL via INTRAVENOUS

## 2018-02-13 MED ORDER — ATORVASTATIN CALCIUM 80 MG PO TABS
80.0000 mg | ORAL_TABLET | Freq: Every day | ORAL | Status: DC
  Administered 2018-02-13 – 2018-02-16 (×3): 80 mg via ORAL
  Filled 2018-02-13 (×5): qty 1

## 2018-02-13 NOTE — ED Notes (Signed)
Aspen collar placed on pt per verbal order from PA.

## 2018-02-13 NOTE — ED Notes (Signed)
Pt has hx of stroke- residual left sided weakness

## 2018-02-13 NOTE — ED Notes (Signed)
Swallow screen deferred due to pt being lethargic. Notified receiving RN.

## 2018-02-13 NOTE — ED Notes (Signed)
Pt converted to SR. PA and MD aware. Repeated EKG

## 2018-02-13 NOTE — Progress Notes (Signed)
New Admission Note:  Arrival Method: on stretcher from ER Mental Orientation: alert & oriented x 4 Telemetry:bedside monitor/ sinus rhythmn Assessment: Completed Skin: pt has an abrasion on the L shoulder, R knee,  L elbow and bruise on L hip IV: R ac w/ normal saline at 118ml Pain:10/10 back pain, pt medicated; see MAR Safety Measures: Safety Fall Prevention Plan was given, discussed. Admission: Completed 3W10: Patient has been orientated to the room, unit and the staff. Family: ER updated family.   Orders have been reviewed and implemented. Will continue to monitor the patient. Call light has been placed within reach and bed alarm has been activated.   Arta Silence ,RN

## 2018-02-13 NOTE — Consult Note (Signed)
Cardiology Consultation:   Patient ID: Kimberly [name]; [id number]; 1956/02/01   Admit date: 02/13/2018 Date of Consult: 02/13/2018  Primary Care Provider: [name] Primary Cardiologist: Hochrein Primary Electrophysiologist:  n/a   Patient Profile:   Kimberly [name] is a 62 y.o. female with a hx of coronary artery disease and previous bypass surgery who is being seen today for the evaluation of paroxysmal atrial fibrillation at the request of Dr. Steffanie Dunn.  History of Present Illness:   Kimberly [name] has a long-standing history of coronary artery disease and underwent coronary artery bypass surgery in 2008, without any serious coronary events since that time and without problems with congestive heart failure or known arrhythmia.  She was admitted earlier today with an embolic right temporal lobe infarct and right occipital lobe infarct and left hemiparesis.  This was complicated by a fall, but it is uncertain whether or not she lost consciousness.    On arrival to the emergency room she was found to have atrial fibrillation with rapid ventricular response, but around 12:30 PM she converted back to normal sinus rhythm.  She was never aware of palpitations.  She denies shortness of breath or angina pectoris now or in the recent past.  She denies previously experiencing syncope and does not have any issues with claudication or lower extremity edema.  She has a remote history of stroke in 2009 (with baseline left hemiparesis and facial droop) and she is chronically on aspirin and clopidogrel.  Her CT also shows evidence of a chronic left frontal lobe infarct which is also new from her previous imaging studies.  She has a chronic right basal ganglia lacunar infarct which might be responsible for her previous stroke.  MRI of the brain in 2009 showed an acute infarction involving the right lateral thalamus and posterior limb of the internal capsule.  MRA at that time showed "absence of flow in the right  internal carotid artery" with complete occlusion of the right internal carotid artery.  Her past medical history is also significant for hypertension, gout and hyperlipidemia but she does not have diabetes mellitus.  She stopped smoking in 2008 when she had her bypass surgery procedure.    In 2011 she had a nuclear stress test which showed a lateral infarct, no reversible ischemia and preserved left ventricular systolic function, EF 74%.  A CT angiogram performed earlier today shows a mild degree of dilation of the ascending aorta with a maximum diameter 4.3 cm, atherosclerosis of the thoracic aorta, as well as mild dilation of the main pulmonary outflow tract suggesting possible pulmonary artery hypertension.  A year ago she had complaints of bilateral lower extremity pain and was scheduled for arterial Doppler studies but these were not performed (no show).   Past Medical History:  Diagnosis Date  . Bicipital tenosynovitis   . Coronary artery disease 05/2007   [id number]   . Fall 02/13/2018  . Family history of ischemic heart disease   . Gout   . Hammer toe   . Hip, thigh, leg, and ankle, insect bite, nonvenomous, without mention of infection(916.4)   . Hyperlipidemia   . Hypertension   . Other abnormality of red blood cells   . Rotator cuff syndrome of left shoulder   . Stroke [facility])     Past Surgical History:  Procedure Laterality Date  . CARDIAC CATHETERIZATION    . CORONARY ARTERY BYPASS GRAFT     triple  . CORONARY ARTERY BYPASS GRAFT  2008  . TUBAL LIGATION  Home Medications:  Prior to Admission medications   Medication Sig Start Date End Date Taking? Authorizing Provider  aspirin 81 MG tablet Take 81 mg by mouth daily.      [provider]  Blood Pressure Monitoring (B-D ASSURE BPM/AUTO ARM CUFF) MISC 1 Device by Does not apply route once. 04/27/14   [name], MD  cloNIDine (CATAPRES) 0.1 MG tablet Take 1 tablet (0.1 mg total) by mouth 2 (two) times  daily. 02/27/17   [name], MD  clopidogrel (PLAVIX) 75 MG tablet Take 1 tablet (75 mg total) by mouth daily. 03/27/17   [name], MD  colchicine 0.6 MG tablet Take 1 tablet (0.6 mg total) by mouth 2 (two) times daily. 12/14/16   [name], MD  feeding supplement, ENSURE COMPLETE, (ENSURE COMPLETE) LIQD Take 237 mLs by mouth 2 (two) times daily between meals. 01/30/17   [name], MD  ibuprofen (ADVIL,MOTRIN) 600 MG tablet Take 1 tablet (600 mg total) by mouth every 6 (six) hours as needed for mild pain or moderate pain. 12/26/15   Clayton Bibles, PA-C  indomethacin (INDOCIN) 50 MG capsule Take 50 mg by mouth 2 (two) times daily as needed (for gout flare ups).  12/08/15   [provider]  metoprolol succinate (TOPROL-XL) 100 MG 24 hr tablet Take 150-200mg  by mouth daily. Take with or immediately following a meal. 03/27/17   [name], MD  metroNIDAZOLE (FLAGYL) 500 MG tablet Take 1 tablet (500 mg total) by mouth 2 (two) times daily. 12/26/15   Clayton Bibles, PA-C  Multiple Vitamins-Minerals (MULTIVITAMIN WITH MINERALS) tablet Take 1 tablet by mouth daily.      [provider]  quinapril (ACCUPRIL) 40 MG tablet Take 1 tablet (40 mg total) by mouth daily. MUST KEEP OV FOR ADDITIONAL REFILLS 12/18/17   [name], MD  rosuvastatin (CRESTOR) 10 MG tablet 1 tablet every other day at bedtime 03/27/17   [name], MD  verapamil (CALAN-SR) 240 MG CR tablet Take 1 tablet (240 mg total) by mouth daily. 03/27/17   [name], MD    Inpatient Medications: Scheduled Meds: . aspirin  81 mg Oral Daily  . atorvastatin  80 mg Oral q1800  . clopidogrel  75 mg Oral Daily  . colchicine  0.6 mg Oral BID  . enoxaparin (LOVENOX) injection  40 mg Subcutaneous Q24H  . feeding supplement (ENSURE ENLIVE)  237 mL Oral BID BM  . iopamidol      . metoprolol tartrate  25 mg Oral Q6H  . multivitamin with minerals  1 tablet Oral Daily  . quinapril  40 mg Oral Daily  .  verapamil  240 mg Oral Daily   Continuous Infusions: . sodium chloride 125 mL/hr at 02/13/18 1542   PRN Meds: acetaminophen **OR** acetaminophen (TYLENOL) oral liquid 160 mg/5 mL **OR** acetaminophen, senna-docusate  Allergies:    Allergies  Allergen Reactions  . Clonidine Derivatives Other (See Comments)    Patch causes scars  . Spironolactone Rash    Social History:   Social History   Socioeconomic History  . Marital status: Single    Spouse name: Not on file  . Number of children: Not on file  . Years of education: Not on file  . Highest education level: Not on file  Occupational History  . Not on file  Social Needs  . Financial resource strain: Not on file  . Food insecurity:    Worry: Not on file    Inability: Not on file  .  Transportation needs:    Medical: Not on file    Non-medical: Not on file  Tobacco Use  . Smoking status: Former Research scientist (life sciences)  . Smokeless tobacco: Former Systems developer    Quit date: 08/09/2008  Substance and Sexual Activity  . Alcohol use: Yes    Alcohol/week: 0.6 oz    Types: 1 Cans of beer per week  . Drug use: Yes    Types: Marijuana    Comment: history of cocaine use  . Sexual activity: Not on file  Lifestyle  . Physical activity:    Days per week: Not on file    Minutes per session: Not on file  . Stress: Not on file  Relationships  . Social connections:    Talks on phone: Not on file    Gets together: Not on file    Attends religious service: Not on file    Active member of club or organization: Not on file    Attends meetings of clubs or organizations: Not on file    Relationship status: Not on file  . Intimate partner violence:    Fear of current or ex partner: Not on file    Emotionally abused: Not on file    Physically abused: Not on file    Forced sexual activity: Not on file  Other Topics Concern  . Not on file  Social History Narrative  . Not on file    Family History:    Family History  Problem Relation Age of Onset    . Hypertension Mother   . Heart attack Father   . Diabetes Father   . Anxiety disorder Sister   . Sarcoidosis Sister      ROS:  Please see the history of present illness.   All other ROS reviewed and negative.     Physical Exam/Data:   Vitals:   02/13/18 1500 02/13/18 1528 02/13/18 1728 02/13/18 1730  BP:  (!) 174/96 (!) 163/98   Pulse:  63 80 72  Resp:      Temp: 97.9 F (36.6 C) 97.9 F (36.6 C) 98 F (36.7 C)   TempSrc:  Oral Oral   SpO2:  99% 94%   Weight: 113 lb 8.6 oz (51.5 kg)       Intake/Output Summary (Last 24 hours) at 02/13/2018 1817 Last data filed at 02/13/2018 1612 Gross per 24 hour  Intake 650.3 ml  Output -  Net 650.3 ml   Filed Weights   02/13/18 1500  Weight: 113 lb 8.6 oz (51.5 kg)   Body mass index is 18.33 kg/m.  General:  Well nourished, well developed, in no acute distress; she is sleepy, but fully oriented and able to provide detailed and accurate history. HEENT: normal Lymph: no adenopathy Neck: no JVD Endocrine:  No thryomegaly Vascular: No carotid bruits; FA pulses 2+ bilaterally without bruits  Cardiac:  normal S1, S2; RRR; no murmur Lungs:  clear to auscultation bilaterally, no wheezing, rhonchi or rales  Abd: soft, nontender, no hepatomegaly  Ext: no edema Musculoskeletal:  No deformities, BUE and BLE strength normal and equal Skin: warm and dry  Neuro: There is dense left-sided hemiparesis Psych:  Normal affect   EKG:  The EKG was personally reviewed and demonstrates: Her electrocardiogram today at 1130 hrs shows possible atrial flutter with 2: 1 AV conduction, more likely atrial fibrillation with rapid ventricular response.  There are prominent changes of left ventricular hypertrophy and repolarization abnormalities.  Repeat ECG performed at 1343 hrs shows sinus bradycardia  with prominent left ventricular hypertrophy and extremely profound repolarization abnormalities suggestive of apical variant hypertrophic cardiomyopathy.  This  tracing is very similar to previous ECG from 2018, with slight prolongation of the QT interval is an additional finding. Telemetry:  Telemetry was personally reviewed and demonstrates: Paroxysmal atrial fibrillation that ended around 12:30 PM, followed by sinus rhythm/sinus bradycardia with PACs  Relevant CV Studies: 2009 echocardiogram SUMMARY - There is some hypokinesis at the base of the inferior wall and    the posterior wall. This is mild. Overall left ventricular    systolic function was normal. Left ventricular ejection    fraction was estimated to be 60 %.  Nuclear stress testing in 2011 describes a lateral infarct, no reversible ischemia, normal LVEF 60%.  05/30/2007 Cardiac Cath  Findings:   Left ventricular gram done in the RAO position showed a spade ventricle  with an EF of 75%.  There was no wall motion abnormality and no mitral regurgitation.      ASSESSMENT:   1. Three-vessel coronary artery disease.  The circumflex and right coronary is essentially nonobstructive disease.  However, she has a complex bifurcation disease in the left anterior descending involving a very large diagonal branch.   2. Normal ejection fraction.      PLAN/DISCUSSION:  This is a difficult case.  I reviewed it with Dr. Burt Knack. Although technically, it could be do-able, percutaneously there would be a significant risk of stent thrombosis due the bifurcation lesion.  I suspect she is best off with bypass surgery.       Laboratory Data:  Chemistry Recent Labs  Lab 02/13/18 1125  NA 144  K 3.5  CL 107  CO2 23  GLUCOSE 97  BUN 13  CREATININE 1.10*  CALCIUM 9.9  GFRNONAA 53*  GFRAA >60  ANIONGAP 14    Recent Labs  Lab 02/13/18 1125  PROT 6.9  ALBUMIN 4.0  AST 35  ALT 27  ALKPHOS 81  BILITOT 1.0   Hematology Recent Labs  Lab 02/13/18 1125  WBC 8.4  RBC 5.69*  HGB 12.4  HCT 40.3  MCV 70.8*  MCH 21.8*  MCHC 30.8  RDW 16.0*  PLT 218   Cardiac Enzymes Recent  Labs  Lab 02/13/18 1545  TROPONINI 0.08*    Recent Labs  Lab 02/13/18 1129  TROPIPOC 0.05    BNPNo results for input(s): BNP, PROBNP in the last 168 hours.  DDimer  Recent Labs  Lab 02/13/18 1125  DDIMER 0.84*    Radiology/Studies:  Dg Shoulder Right  Result Date: 02/13/2018 CLINICAL DATA:  [address] at home today BILATERAL shoulder pain worse on LEFT than RIGHT EXAM: RIGHT SHOULDER - 2+ VIEW COMPARISON:  None FINDINGS: Osseous mineralization diffusely decreased. AC joint alignment normal. No acute fracture, dislocation, or bone destruction. Visualized RIGHT ribs intact. IMPRESSION: No acute osseous abnormalities. Electronically Signed   By: Lavonia Dana M.D.   On: 02/13/2018 14:50   Ct Head Wo Contrast  Result Date: 02/13/2018 CLINICAL DATA:  Status post fall.  Found on floor.  Weakness. EXAM: CT HEAD WITHOUT CONTRAST CT CERVICAL SPINE WITHOUT CONTRAST TECHNIQUE: Multidetector CT imaging of the head and cervical spine was performed following the standard protocol without intravenous contrast. Multiplanar CT image reconstructions of the cervical spine were also generated. COMPARISON:  4/23/9 FINDINGS: CT HEAD FINDINGS Brain: There is a large asymmetric area of low attenuation involving the right temporal lobe and right occipital lobe which is new from the previous exam. Also new  from the previous exam is left frontal lobe cortical and subcortical encephalomalacia compatible with a chronic infarct. There is moderate, progressive diffuse low-attenuation within the subcortical and periventricular white matter compatible with chronic microvascular disease. A chronic appearing lacunar infarct within the posterior right basal ganglia noted. Moderately progressive atrophy with prominence of the sulci and ventricles noted bilaterally. Vascular: Atherosclerotic calcifications noted. No hyperdense vessel or unexpected calcification noted. Skull: Normal. Negative for fracture or focal lesion. Sinuses/Orbits:  No acute finding. Other: None CT CERVICAL SPINE FINDINGS Alignment: Normal. Skull base and vertebrae: No acute fracture. No primary bone lesion or focal pathologic process. Soft tissues and spinal canal: None Disc levels: Multi level degenerative disc disease noted. Most advanced at C4-5, C5-6 and C6-7. Upper chest: Aortic atherosclerosis noted. Other: None IMPRESSION: 1. There is a moderate to large area of low attenuation involving the right temporal lobe and right occipital lobe compatible with an acute or subacute right PCA infarct. No hemorrhage identified and no significant mass effect. 2. Chronic left frontal lobe infarct with progressive global atrophy and chronic small vessel ischemic change. 3. No evidence for cervical spine fracture or dislocation. 4. Cervical spondylosis. 5. Critical Value/emergent results were called by telephone at the time of interpretation on 02/13/2018 at 12:24 pm to [facility], [place], who verbally acknowledged these results. Electronically Signed   By: Kerby Moors M.D.   On: 02/13/2018 12:24   Ct Angio Chest Pe W And/or Wo Contrast  Result Date: 02/13/2018 CLINICAL DATA:  Chest pain and weakness EXAM: CT ANGIOGRAPHY CHEST WITH CONTRAST TECHNIQUE: Multidetector CT imaging of the chest was performed using the standard protocol during bolus administration of intravenous contrast. Multiplanar CT image reconstructions and MIPs were obtained to evaluate the vascular anatomy. CONTRAST:  149mL ISOVUE-370 IOPAMIDOL (ISOVUE-370) INJECTION 76% COMPARISON:  Chest radiograph August 24, 2010 FINDINGS: Cardiovascular: There is no demonstrable pulmonary embolus. The ascending thoracic aorta has a maximum transverse diameter of 4.3 x 4.3 cm. The measured diameter at the aortic arch is 3.6 cm. No dissection is evident. It must be cautioned that the contrast bolus in the aorta is not sufficient to exclude the possibility of dissection is a differential consideration. There is aortic atherosclerosis.  There are a few scattered foci of calcification in visualized great vessels. There are foci of native coronary artery calcification evident. Patient is status post coronary artery bypass grafting. There is no pericardial effusion or pericardial thickening. The main pulmonary outflow tract measures 3.4 cm, enlarged. Mediastinum/Nodes: Visualized thyroid appears normal. There is no appreciable thoracic adenopathy. No esophageal lesions are evident. Lungs/Pleura: There is mild lower lobe atelectasis bilaterally. There is no lung edema or consolidation. No pleural effusion or pleural thickening evident. Upper Abdomen: In the visualized upper abdomen, there is atherosclerotic calcification in the aorta. There also scattered renal artery calcifications. Visualized upper abdominal structures otherwise appear unremarkable. Musculoskeletal: Patient is status post median sternotomy. No blastic or lytic bone lesions. No chest wall lesions evident. Review of the MIP images confirms the above findings. IMPRESSION: 1.  No demonstrable pulmonary embolus. 2. Prominence of the ascending thoracic aorta measuring 4.3 x 4.3 cm. Prominence at the arch level measuring 3.6 cm in diameter. Recommend annual imaging followup by CTA or MRA. This recommendation follows 2010 ACCF/AHA/AATS/ACR/ASA/SCA/SCAI/SIR/STS/SVM Guidelines for the Diagnosis and Management of Patients with Thoracic Aortic Disease. Circulation.2010; 121: I967-E938. No dissection evident. It must be cautioned that the contrast bolus in the aorta is insufficient to exclude dissection as a potential differential diagnostic consideration  from a radiographic standpoint. There is aortic atherosclerosis. Patient is status post coronary artery bypass grafting. 3. Prominence of the main pulmonary outflow tract measuring 3.4 cm, a finding felt to be indicative of a degree of pulmonary arterial hypertension. 4. No lung edema or consolidation. Areas of lower lobe atelectasis. 5.  No  evident thoracic adenopathy. Aortic Atherosclerosis (ICD10-I70.0). Electronically Signed   By: Lowella Grip III M.D.   On: 02/13/2018 13:36   Ct Cervical Spine Wo Contrast  Result Date: 02/13/2018 CLINICAL DATA:  Status post fall.  Found on floor.  Weakness. EXAM: CT HEAD WITHOUT CONTRAST CT CERVICAL SPINE WITHOUT CONTRAST TECHNIQUE: Multidetector CT imaging of the head and cervical spine was performed following the standard protocol without intravenous contrast. Multiplanar CT image reconstructions of the cervical spine were also generated. COMPARISON:  4/23/9 FINDINGS: CT HEAD FINDINGS Brain: There is a large asymmetric area of low attenuation involving the right temporal lobe and right occipital lobe which is new from the previous exam. Also new from the previous exam is left frontal lobe cortical and subcortical encephalomalacia compatible with a chronic infarct. There is moderate, progressive diffuse low-attenuation within the subcortical and periventricular white matter compatible with chronic microvascular disease. A chronic appearing lacunar infarct within the posterior right basal ganglia noted. Moderately progressive atrophy with prominence of the sulci and ventricles noted bilaterally. Vascular: Atherosclerotic calcifications noted. No hyperdense vessel or unexpected calcification noted. Skull: Normal. Negative for fracture or focal lesion. Sinuses/Orbits: No acute finding. Other: None CT CERVICAL SPINE FINDINGS Alignment: Normal. Skull base and vertebrae: No acute fracture. No primary bone lesion or focal pathologic process. Soft tissues and spinal canal: None Disc levels: Multi level degenerative disc disease noted. Most advanced at C4-5, C5-6 and C6-7. Upper chest: Aortic atherosclerosis noted. Other: None IMPRESSION: 1. There is a moderate to large area of low attenuation involving the right temporal lobe and right occipital lobe compatible with an acute or subacute right PCA infarct. No  hemorrhage identified and no significant mass effect. 2. Chronic left frontal lobe infarct with progressive global atrophy and chronic small vessel ischemic change. 3. No evidence for cervical spine fracture or dislocation. 4. Cervical spondylosis. 5. Critical Value/emergent results were called by telephone at the time of interpretation on 02/13/2018 at 12:24 pm to [facility], [place], who verbally acknowledged these results. Electronically Signed   By: Kerby Moors M.D.   On: 02/13/2018 12:24   Ct Thoracic Spine Wo Contrast  Result Date: 02/13/2018 CLINICAL DATA:  Back pain with minor trauma. Found on floor. Generalized weakness EXAM: CT THORACIC AND LUMBAR SPINE WITHOUT CONTRAST TECHNIQUE: Multidetector CT imaging of the thoracic and lumbar spine was performed without contrast. Multiplanar CT image reconstructions were also generated. COMPARISON:  None. FINDINGS: CT THORACIC SPINE FINDINGS Alignment: No traumatic malalignment. Mild thoracic dextrocurvature which could be positional. Vertebrae: Negative for fracture, endplate erosion, or aggressive bone lesion. Paraspinal and other soft tissues: Intrathoracic findings described on dedicated CT. Small intramuscular lipoma within the right trapezius, right para median. Disc levels: Minor spondylosis.  No visible cord impingement CT LUMBAR SPINE FINDINGS Segmentation: Articulation between the L5 left transverse process and sacrum. Alignment: Normal Vertebrae: Negative for fracture or visible bone lesion. Paraspinal and other soft tissues: No acute finding. Atherosclerosis with 27 mm diameter infrarenal aorta. Colonic diverticulosis. Disc levels: Minor disc bulging.  No impingement IMPRESSION: No acute finding in thoracic or lumbar spine. Electronically Signed   By: Monte Fantasia M.D.   On: 02/13/2018 13:33  Ct Lumbar Spine Wo Contrast  Result Date: 02/13/2018 CLINICAL DATA:  Back pain with minor trauma. Found on floor. Generalized weakness EXAM: CT THORACIC AND  LUMBAR SPINE WITHOUT CONTRAST TECHNIQUE: Multidetector CT imaging of the thoracic and lumbar spine was performed without contrast. Multiplanar CT image reconstructions were also generated. COMPARISON:  None. FINDINGS: CT THORACIC SPINE FINDINGS Alignment: No traumatic malalignment. Mild thoracic dextrocurvature which could be positional. Vertebrae: Negative for fracture, endplate erosion, or aggressive bone lesion. Paraspinal and other soft tissues: Intrathoracic findings described on dedicated CT. Small intramuscular lipoma within the right trapezius, right para median. Disc levels: Minor spondylosis.  No visible cord impingement CT LUMBAR SPINE FINDINGS Segmentation: Articulation between the L5 left transverse process and sacrum. Alignment: Normal Vertebrae: Negative for fracture or visible bone lesion. Paraspinal and other soft tissues: No acute finding. Atherosclerosis with 27 mm diameter infrarenal aorta. Colonic diverticulosis. Disc levels: Minor disc bulging.  No impingement IMPRESSION: No acute finding in thoracic or lumbar spine. Electronically Signed   By: Monte Fantasia M.D.   On: 02/13/2018 13:33   Dg Shoulder Left  Result Date: 02/13/2018 CLINICAL DATA:  [address] at home today BILATERAL shoulder pain worse on LEFT than RIGHT EXAM: LEFT SHOULDER - 2+ VIEW COMPARISON:  Rif 12/01/2008 FINDINGS: Osseous demineralization. AC joint alignment normal. No acute fracture, dislocation, or bone destruction. Small calcified ossicle lateral to the acromion appears stable. Visualized LEFT ribs intact. Atherosclerotic calcifications aorta and postsurgical changes of CABG noted. IMPRESSION: No acute LEFT shoulder abnormalities. Electronically Signed   By: Lavonia Dana M.D.   On: 02/13/2018 14:49    Assessment and Plan:   1. Paroxysmal atrial fibrillation: This is a newly identified abnormality but she was completely unaware of palpitations is quite likely that she has had numerous episodes of atrial fibrillation in  the past that have gone undetected.  Once this is considered to be safe by the stroke team, she will need to switch from aspirin/clopidogrel to full oral anticoagulation preferably with Eliquis or Xarelto.  Although she does have evidence of atherosclerosis involving the carotids, aortic arch and coronary arteries, I am not sure that there is enough reason to provide simultaneous antiplatelet therapy at this point.  The risk of bleeding is probably not justified.  2. CAD s/p CABG: Asymptomatic.  The minor increase in cardiac troponin I is nonspecific and is not consistent with a true episode of acute coronary insufficiency.  3.  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  Surprisingly this was not described on echocardiogram from 2009, but a "spade ventricle" was described on left ventriculography in 2008.  A repeat echo has been ordered.  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  As far as I can tell, there is no family history of cardiomyopathy or sudden death.  4. HTN: Defer management of hypertension to the stroke team during the acute ischemic.  Chronically on beta-blockers and ACE inhibitors which appear to be appropriate agents consider her disease history.  5. HLP: She is on a highly active statin.  The only recent lipid profile is from 2015 and showed a LDL level that was not at target <70.  Unclear what medicine she was taking then as opposed to more recently.  Multiple changes in her lipid-lowering profile probably occurred over the years.  6.  Aneurysm of the ascending aorta: Relatively small  in caliber, likely not yet clinically significant.  For questions or updates, please contact [place] Please consult [email or web address] for contact info under Cardiology/STEMI.   Signed, Sanda Klein, MD    02/13/2018 6:17 PM

## 2018-02-13 NOTE — Consult Note (Addendum)
Requesting Physician: Dr. Maryan Rued    Chief Complaint: Stroke  History obtained from:  Patient and chart  HPI:                                                                                                                                         Kimberly [name] is an 62 y.o. female with previous stroke, hypertension, hyperlipidemia who presents the hospital secondary to daughter finding her on the floor.  Upon arriving in the ED she is primarily complaining of back pain and found to be in new atrial fibrillation with RVR. CT of head revealed a right temporal lobe infarct that is likely subacute.  Patient at this point in time has had morphine for her back pain and is difficult to keep awake.  What I could gather was that she got up at 6 in the morning to make her coffee and eat her breakfast and at approximately 7:00 she went to the sink and fell down.  She could not tell me if she lost consciousness; however, she was found down by daughter.  Currently she is complaining of severe back pain.  Patient is on aspirin and Plavix.  Date last known well: Date: 02/13/2018 Time last known well: Time: 08:00 tPA Given: No: Out of the window NIHSS 6 Modified Rankin: Rankin Score=0    Past Medical History:  Diagnosis Date  . Bicipital tenosynovitis   . Coronary artery disease 05/2007   [id number]   . Family history of ischemic heart disease   . Gout   . Hammer toe   . Hip, thigh, leg, and ankle, insect bite, nonvenomous, without mention of infection(916.4)   . Hyperlipidemia   . Hypertension   . Other abnormality of red blood cells   . Rotator cuff syndrome of left shoulder   . Stroke [facility])     Past Surgical History:  Procedure Laterality Date  . CARDIAC CATHETERIZATION    . CORONARY ARTERY BYPASS GRAFT     triple  . CORONARY ARTERY BYPASS GRAFT  2008  . TUBAL LIGATION      Family History  Problem Relation Age of Onset  . Hypertension Mother   . Heart attack Father   . Diabetes Father    . Anxiety disorder Sister   . Sarcoidosis Sister      Social History:  reports that she has quit smoking. She quit smokeless tobacco use about 9 years ago. She reports that she drinks about 0.6 oz of alcohol per week. Her drug history is not on file.  Allergies:  Allergies  Allergen Reactions  . Clonidine Derivatives Other (See Comments)    Patch causes scars  . Spironolactone Rash    Medications:  Current Facility-Administered Medications  Medication Dose Route Frequency Provider Last Rate Last Dose  . diltiazem (CARDIZEM) 1 mg/mL load via infusion 20 mg  20 mg Intravenous Once Joy, Shawn C, PA-C       And  . diltiazem (CARDIZEM) 100 mg in dextrose 5% 126mL (1 mg/mL) infusion  5-15 mg/hr Intravenous Continuous Joy, Shawn C, PA-C      . iopamidol (ISOVUE-370) 76 % injection 100 mL  100 mL Intravenous Once PRN Blanchie Dessert, MD      . iopamidol (ISOVUE-370) 76 % injection            Current Outpatient Medications  Medication Sig Dispense Refill  . aspirin 81 MG tablet Take 81 mg by mouth daily.      . Blood Pressure Monitoring (B-D ASSURE BPM/AUTO ARM CUFF) MISC 1 Device by Does not apply route once. 1 each 0  . cloNIDine (CATAPRES) 0.1 MG tablet Take 1 tablet (0.1 mg total) by mouth 2 (two) times daily. 180 tablet 3  . clopidogrel (PLAVIX) 75 MG tablet Take 1 tablet (75 mg total) by mouth daily. 90 tablet 2  . colchicine 0.6 MG tablet Take 1 tablet (0.6 mg total) by mouth 2 (two) times daily. 60 tablet 0  . feeding supplement, ENSURE COMPLETE, (ENSURE COMPLETE) LIQD Take 237 mLs by mouth 2 (two) times daily between meals. 30 Bottle 5  . ibuprofen (ADVIL,MOTRIN) 600 MG tablet Take 1 tablet (600 mg total) by mouth every 6 (six) hours as needed for mild pain or moderate pain. 15 tablet 0  . indomethacin (INDOCIN) 50 MG capsule Take 50 mg by mouth 2 (two) times  daily as needed (for gout flare ups).   0  . metoprolol succinate (TOPROL-XL) 100 MG 24 hr tablet Take 150-200mg  by mouth daily. Take with or immediately following a meal. 180 tablet 2  . metroNIDAZOLE (FLAGYL) 500 MG tablet Take 1 tablet (500 mg total) by mouth 2 (two) times daily. 14 tablet 0  . Multiple Vitamins-Minerals (MULTIVITAMIN WITH MINERALS) tablet Take 1 tablet by mouth daily.      . quinapril (ACCUPRIL) 40 MG tablet Take 1 tablet (40 mg total) by mouth daily. MUST KEEP OV FOR ADDITIONAL REFILLS 60 tablet 0  . rosuvastatin (CRESTOR) 10 MG tablet 1 tablet every other day at bedtime 90 tablet 2  . verapamil (CALAN-SR) 240 MG CR tablet Take 1 tablet (240 mg total) by mouth daily. 90 tablet 2     ROS:                                                                                                                                       History obtained from the patient  General ROS: negative for - chills, fatigue, fever, night sweats, weight gain or weight loss Psychological ROS: negative for - , hallucinations, memory difficulties, mood swings or  Ophthalmic ROS: negative for - blurry vision,  double vision, eye pain or loss of vision ENT ROS: negative for - epistaxis, nasal discharge, oral lesions, sore throat, tinnitus or vertigo Respiratory ROS: negative for - cough,  shortness of breath or wheezing Cardiovascular ROS: negative for - chest pain, dyspnea on exertion,  Gastrointestinal ROS: negative for - abdominal pain, diarrhea,  nausea/vomiting or stool incontinence Genito-Urinary ROS: negative for - dysuria, hematuria, incontinence or urinary frequency/urgency Musculoskeletal ROS: negative for - joint swelling or muscular weakness Neurological ROS: as noted in HPI   General Examination:                                                                                                      Blood pressure (!) 147/124, pulse (!) 137, temperature 98.8 F (37.1 C), temperature source  Oral, resp. rate 14, SpO2 100 %.  HEENT-  Normocephalic, no lesions, without obvious abnormality.  Normal external eye and conjunctiva.   Cardiovascular- S1-S2 audible, pulses palpable throughout   Extremities- Warm, dry and intact Musculoskeletal-positive joint tenderness,  Skin-warm and dry, no hyperpigmentation, vitiligo, or suspicious lesions  Neurological Examination Mental Status: Decreased level of consciousness after receiving morphine secondary for pain.  Oriented.  Speech fluent with intact comprehension. Is able to follow commands; however, it is difficult for her as she keeps falling asleep on me.  In addition, she is in significant back pain so some of the things she was asked her to do she has difficulty with secondary to pain. Left hemineglect noted.  Cranial Nerves: II: Complete left hemianopsia. PERRL. III,IV, VI: ptosis not present, patient is able to cross midline with her eyes but does have right gaze preference. V,VII: Left facial droop; however bilateral face sensation is intact.  VIII: hearing normal bilaterally IX,X: uvula rises symmetrically XI: Head rotated to right preferentially.  XII: midline tongue extension Motor: Patient is weak throughout in the context of significant pain.  When asked to hold her left arm up it drifts downwards.  There is no drift in bilateral legs.  She is able to move all extremities antigravity but this is limited secondary to pain Sensory: Pinprick and light touch intact throughout, bilaterally Deep Tendon Reflexes: 2+ and symmetric throughout Plantars: Right: downgoing  Left: downgoing Cerebellar: No gross dysmetria was noted with finger-nose or heel-to-shin. Again, this was difficult for her secondary to back pain Gait: Not tested   Lab Results: Basic Metabolic Panel: Recent Labs  Lab 02/13/18 1125  NA 144  K 3.5  CL 107  CO2 23  GLUCOSE 97  BUN 13  CREATININE 1.10*  CALCIUM 9.9    CBC: Recent Labs  Lab 02/13/18 1125   WBC 8.4  NEUTROABS 5.7  HGB 12.4  HCT 40.3  MCV 70.8*  PLT 218    Lipid Panel: No results for input(s): CHOL, TRIG, HDL, CHOLHDL, VLDL, LDLCALC in the last 168 hours.  CBG: No results for input(s): GLUCAP in the last 168 hours.  Imaging: Ct Head Wo Contrast  Result Date: 02/13/2018 CLINICAL DATA:  Status post fall.  Found on floor.  Weakness. EXAM: CT HEAD WITHOUT  CONTRAST CT CERVICAL SPINE WITHOUT CONTRAST TECHNIQUE: Multidetector CT imaging of the head and cervical spine was performed following the standard protocol without intravenous contrast. Multiplanar CT image reconstructions of the cervical spine were also generated. COMPARISON:  4/23/9 FINDINGS: CT HEAD FINDINGS Brain: There is a large asymmetric area of low attenuation involving the right temporal lobe and right occipital lobe which is new from the previous exam. Also new from the previous exam is left frontal lobe cortical and subcortical encephalomalacia compatible with a chronic infarct. There is moderate, progressive diffuse low-attenuation within the subcortical and periventricular white matter compatible with chronic microvascular disease. A chronic appearing lacunar infarct within the posterior right basal ganglia noted. Moderately progressive atrophy with prominence of the sulci and ventricles noted bilaterally. Vascular: Atherosclerotic calcifications noted. No hyperdense vessel or unexpected calcification noted. Skull: Normal. Negative for fracture or focal lesion. Sinuses/Orbits: No acute finding. Other: None CT CERVICAL SPINE FINDINGS Alignment: Normal. Skull base and vertebrae: No acute fracture. No primary bone lesion or focal pathologic process. Soft tissues and spinal canal: None Disc levels: Multi level degenerative disc disease noted. Most advanced at C4-5, C5-6 and C6-7. Upper chest: Aortic atherosclerosis noted. Other: None IMPRESSION: 1. There is a moderate to large area of low attenuation involving the right  temporal lobe and right occipital lobe compatible with an acute or subacute right PCA infarct. No hemorrhage identified and no significant mass effect. 2. Chronic left frontal lobe infarct with progressive global atrophy and chronic small vessel ischemic change. 3. No evidence for cervical spine fracture or dislocation. 4. Cervical spondylosis. 5. Critical Value/emergent results were called by telephone at the time of interpretation on 02/13/2018 at 12:24 pm to [facility], [facility], who verbally acknowledged these results. Electronically Signed   By: Kerby Moors M.D.   On: 02/13/2018 12:24   Ct Cervical Spine Wo Contrast  Result Date: 02/13/2018 CLINICAL DATA:  Status post fall.  Found on floor.  Weakness. EXAM: CT HEAD WITHOUT CONTRAST CT CERVICAL SPINE WITHOUT CONTRAST TECHNIQUE: Multidetector CT imaging of the head and cervical spine was performed following the standard protocol without intravenous contrast. Multiplanar CT image reconstructions of the cervical spine were also generated. COMPARISON:  4/23/9 FINDINGS: CT HEAD FINDINGS Brain: There is a large asymmetric area of low attenuation involving the right temporal lobe and right occipital lobe which is new from the previous exam. Also new from the previous exam is left frontal lobe cortical and subcortical encephalomalacia compatible with a chronic infarct. There is moderate, progressive diffuse low-attenuation within the subcortical and periventricular white matter compatible with chronic microvascular disease. A chronic appearing lacunar infarct within the posterior right basal ganglia noted. Moderately progressive atrophy with prominence of the sulci and ventricles noted bilaterally. Vascular: Atherosclerotic calcifications noted. No hyperdense vessel or unexpected calcification noted. Skull: Normal. Negative for fracture or focal lesion. Sinuses/Orbits: No acute finding. Other: None CT CERVICAL SPINE FINDINGS Alignment: Normal. Skull base and vertebrae: No  acute fracture. No primary bone lesion or focal pathologic process. Soft tissues and spinal canal: None Disc levels: Multi level degenerative disc disease noted. Most advanced at C4-5, C5-6 and C6-7. Upper chest: Aortic atherosclerosis noted. Other: None IMPRESSION: 1. There is a moderate to large area of low attenuation involving the right temporal lobe and right occipital lobe compatible with an acute or subacute right PCA infarct. No hemorrhage identified and no significant mass effect. 2. Chronic left frontal lobe infarct with progressive global atrophy and chronic small vessel ischemic change. 3. No  evidence for cervical spine fracture or dislocation. 4. Cervical spondylosis. 5. Critical Value/emergent results were called by telephone at the time of interpretation on 02/13/2018 at 12:24 pm to [facility], [place], who verbally acknowledged these results. Electronically Signed   By: Kerby Moors M.D.   On: 02/13/2018 12:24    Assessment and plan discussed with with attending physician and they are in agreement.   Etta Quill PA-C [facility] [phone]  02/13/2018, 1:32 PM   Assessment: 62 y.o. female with subacute right temporal lobe and right occipital lobe infarct.Also has a chronic left frontal lobe infarct.  Patient arrived with new A. fib with RVR which is likely the etiology for her new stroke.  Stroke Risk Factors - hyperlipidemia and hypertension as well as new onset atrial fibrillation  Recommendations: --HgbA1c, fasting lipid panel --MRI/MRA of the brain without contrast --PT consult, OT consult, Speech consult --Echocardiogram --Carotid ultrasound --80 mg of Atorvastatin --Prophylactic therapy-Antiplatelet med: --Given her new atrial fibrillation patient will likely need to be on an anticoagulant; however, will continue aspirin Plavix until stroke team sees her tomorrow --Telemetry monitoring --Cardiology consult --Frequent neuro checks --NPO until passes stroke swallow  screen --please page stroke NP  Or  PA  Or MD from 8am -4 pm  as this patient from this time will be  followed by the stroke.   You can look them up on [email or web address]  Password [email or web address]   I have seen and examined the patient. I have amended the assessment and recommendations above..  Electronically signed: Dr. Kerney Elbe

## 2018-02-13 NOTE — Progress Notes (Signed)
Lab called in critical troponin level of 0.08. MD [facility] page notified.

## 2018-02-13 NOTE — ED Provider Notes (Addendum)
[facility] EMERGENCY DEPARTMENT Provider Note   CSN: [id number] Arrival date & time: 02/13/18  1054     History   Chief Complaint Chief Complaint  Patient presents with  . Fall    HPI Kimberly [name] is a 62 y.o. female.  HPI  Kimberly [name] is a 62 y.o. female, with a history of CABG, CAD, hyperlipidemia, HTN, and right ICA stroke, presenting to the ED with a syncopal episode that occurred around 8 AM this morning.  Patient states she woke up around 6 AM and felt weaker than normal, but would not specify what she means by this. She got up to make herself some breakfast and then woke up on the floor.  She states, "I guess I got lightheaded and passed out.  I do not know."  She endorses multiple recent falls and increased weakness. She was found on the floor by her daughter. Complains of severe, sharp mid and lower back pain. Patient has baseline left-sided weakness and facial droop due to a stroke. Denies headache, neck pain, vision loss, increased weakness, numbness, chest pain, shortness of breath, abdominal pain, or any other complaints.   Past Medical History:  Diagnosis Date  . Bicipital tenosynovitis   . Coronary artery disease 05/2007   [id number]   . Family history of ischemic heart disease   . Gout   . Hammer toe   . Hip, thigh, leg, and ankle, insect bite, nonvenomous, without mention of infection(916.4)   . Hyperlipidemia   . Hypertension   . Other abnormality of red blood cells   . Rotator cuff syndrome of left shoulder   . Stroke [facility])     Patient Active Problem List   Diagnosis Date Noted  . Atrial flutter with rapid ventricular response ([place]) 02/13/2018  . Positive D dimer 02/13/2018  . Back pain 02/13/2018  . CAD, ARTERY BYPASS GRAFT 05/03/2009  . TOBACCO USE, QUIT 05/03/2009  . ROTATOR CUFF SYNDROME, LEFT 02/12/2009  . SHOULDER PAIN, LEFT 11/03/2008  . HAMMER TOE, ACQUIRED 02/11/2008  . INSECT BITE, LEG 02/11/2008  . Cerebral  artery occlusion with cerebral infarction ([place]) 12/05/2007  . Dyslipidemia 11/05/2007  . UNSPECIFIED DISORDER TEETH&SUPPORTING STRUCTURES 11/05/2007  . BICEPS TENDINITIS, RIGHT 04/24/2007  . Essential hypertension 04/23/2007  . MICROCYTOSIS 04/23/2007    Past Surgical History:  Procedure Laterality Date  . CARDIAC CATHETERIZATION    . CORONARY ARTERY BYPASS GRAFT     triple  . CORONARY ARTERY BYPASS GRAFT  2008  . TUBAL LIGATION       OB History   None      Home Medications    Prior to Admission medications   Medication Sig Start Date End Date Taking? Authorizing Provider  aspirin 81 MG tablet Take 81 mg by mouth daily.      [provider]  Blood Pressure Monitoring (B-D ASSURE BPM/AUTO ARM CUFF) MISC 1 Device by Does not apply route once. 04/27/14   [name], MD  cloNIDine (CATAPRES) 0.1 MG tablet Take 1 tablet (0.1 mg total) by mouth 2 (two) times daily. 02/27/17   [name], MD  clopidogrel (PLAVIX) 75 MG tablet Take 1 tablet (75 mg total) by mouth daily. 03/27/17   [name], MD  colchicine 0.6 MG tablet Take 1 tablet (0.6 mg total) by mouth 2 (two) times daily. 12/14/16   [name], MD  feeding supplement, ENSURE COMPLETE, (ENSURE COMPLETE) LIQD Take 237 mLs by mouth 2 (two) times daily between meals. 01/30/17  [name], MD  ibuprofen (ADVIL,MOTRIN) 600 MG tablet Take 1 tablet (600 mg total) by mouth every 6 (six) hours as needed for mild pain or moderate pain. 12/26/15   Clayton Bibles, PA-C  indomethacin (INDOCIN) 50 MG capsule Take 50 mg by mouth 2 (two) times daily as needed (for gout flare ups).  12/08/15   [provider]  metoprolol succinate (TOPROL-XL) 100 MG 24 hr tablet Take 150-200mg  by mouth daily. Take with or immediately following a meal. 03/27/17   [name], MD  metroNIDAZOLE (FLAGYL) 500 MG tablet Take 1 tablet (500 mg total) by mouth 2 (two) times daily. 12/26/15   Clayton Bibles, PA-C  Multiple Vitamins-Minerals  (MULTIVITAMIN WITH MINERALS) tablet Take 1 tablet by mouth daily.      [provider]  quinapril (ACCUPRIL) 40 MG tablet Take 1 tablet (40 mg total) by mouth daily. MUST KEEP OV FOR ADDITIONAL REFILLS 12/18/17   [name], MD  rosuvastatin (CRESTOR) 10 MG tablet 1 tablet every other day at bedtime 03/27/17   [name], MD  verapamil (CALAN-SR) 240 MG CR tablet Take 1 tablet (240 mg total) by mouth daily. 03/27/17   [name], MD    Family History Family History  Problem Relation Age of Onset  . Hypertension Mother   . Heart attack Father   . Diabetes Father   . Anxiety disorder Sister   . Sarcoidosis Sister     Social History Social History   Tobacco Use  . Smoking status: Former Research scientist (life sciences)  . Smokeless tobacco: Former Systems developer    Quit date: 08/09/2008  Substance Use Topics  . Alcohol use: Yes    Alcohol/week: 0.6 oz    Types: 1 Cans of beer per week  . Drug use: Not on file     Allergies   Clonidine derivatives and Spironolactone   Review of Systems Review of Systems  Constitutional: Negative for chills, diaphoresis and fever.  Eyes: Negative for visual disturbance.  Respiratory: Negative for shortness of breath.   Cardiovascular: Negative for chest pain.  Gastrointestinal: Negative for abdominal pain, blood in stool, diarrhea, nausea and vomiting.  Musculoskeletal: Positive for back pain. Negative for neck pain.  Neurological: Positive for syncope. Negative for weakness, numbness and headaches.  All other systems reviewed and are negative.    Physical Exam Updated Vital Signs BP (!) 147/124   Pulse (!) 137   Temp 98.8 F (37.1 C) (Oral)   Resp 14   SpO2 100%   Physical Exam  Constitutional: She is oriented to person, place, and time. She appears well-developed and well-nourished. No distress.  HENT:  Head: Normocephalic and atraumatic.  Mouth/Throat: Oropharynx is clear and moist.  Eyes: Pupils are equal, round, and reactive to light.  Conjunctivae are normal.  Neck: Neck supple.  Cardiovascular: Normal rate, regular rhythm, normal heart sounds and intact distal pulses.  Pulmonary/Chest: Effort normal and breath sounds normal. No respiratory distress.  Abdominal: Soft. There is no tenderness. There is no guarding.  Musculoskeletal: She exhibits tenderness. She exhibits no edema.  Tenderness along the entire length of the midline thoracic and lumbar spine without noted deformity, step-off, swelling, or wounds. Abrasions to the left shoulder and right knee. Passive range of motion intact through the cardinal directions of each of the major joints.  Lymphadenopathy:    She has no cervical adenopathy.  Neurological: She is alert and oriented to person, place, and time.  Left-sided facial droop. Left side weaker than the right in the  upper and lower extremities, however, patient would not comply with any other part of the exam, just complaining of her back pain. Patient states she has left-sided deficits at baseline from previous stroke.  Skin: Skin is warm and dry. Capillary refill takes less than 2 seconds. She is not diaphoretic.  Psychiatric: She has a normal mood and affect. Her behavior is normal.  Nursing note and vitals reviewed.    ED Treatments / Results  Labs (all labs ordered are listed, but only abnormal results are displayed) Labs Reviewed  COMPREHENSIVE METABOLIC PANEL - Abnormal; Notable for the following components:      Result Value   Creatinine, Ser 1.10 (*)    GFR calc non Af Amer 53 (*)    All other components within normal limits  CBC WITH DIFFERENTIAL/PLATELET - Abnormal; Notable for the following components:   RBC 5.69 (*)    MCV 70.8 (*)    MCH 21.8 (*)    RDW 16.0 (*)    All other components within normal limits  URINALYSIS, ROUTINE W REFLEX MICROSCOPIC - Abnormal; Notable for the following components:   Hgb urine dipstick SMALL (*)    Ketones, ur 5 (*)    Protein, ur 100 (*)    All  other components within normal limits  RAPID URINE DRUG SCREEN, HOSP PERFORMED - Abnormal; Notable for the following components:   Opiates POSITIVE (*)    Tetrahydrocannabinol POSITIVE (*)    Barbiturates   (*)    Value: Result not available. Reagent lot number recalled by manufacturer.   All other components within normal limits  D-DIMER, QUANTITATIVE (NOT AT [facility]) - Abnormal; Notable for the following components:   D-Dimer, Quant 0.84 (*)    All other components within normal limits  ETHANOL  I-STAT TROPONIN, ED    EKG EKG Interpretation  Date/Time:  Wednesday February 13 2018 11:09:58 EDT Ventricular Rate:  146 PR Interval:    QRS Duration: 98 QT Interval:  314 QTC Calculation: 490 R Axis:   75 Text Interpretation:  new Atrial flutter with predominant 2:1 AV block Repol abnrm, prob ischemia, anterolateral lds Artifact in lead(s) I II III aVR aVL aVF V1 V2 Confirmed by Blanchie Dessert [phone]) on 02/13/2018 11:18:13 AM   Radiology Ct Head Wo Contrast  Result Date: 02/13/2018 CLINICAL DATA:  Status post fall.  Found on floor.  Weakness. EXAM: CT HEAD WITHOUT CONTRAST CT CERVICAL SPINE WITHOUT CONTRAST TECHNIQUE: Multidetector CT imaging of the head and cervical spine was performed following the standard protocol without intravenous contrast. Multiplanar CT image reconstructions of the cervical spine were also generated. COMPARISON:  4/23/9 FINDINGS: CT HEAD FINDINGS Brain: There is a large asymmetric area of low attenuation involving the right temporal lobe and right occipital lobe which is new from the previous exam. Also new from the previous exam is left frontal lobe cortical and subcortical encephalomalacia compatible with a chronic infarct. There is moderate, progressive diffuse low-attenuation within the subcortical and periventricular white matter compatible with chronic microvascular disease. A chronic appearing lacunar infarct within the posterior right basal ganglia noted.  Moderately progressive atrophy with prominence of the sulci and ventricles noted bilaterally. Vascular: Atherosclerotic calcifications noted. No hyperdense vessel or unexpected calcification noted. Skull: Normal. Negative for fracture or focal lesion. Sinuses/Orbits: No acute finding. Other: None CT CERVICAL SPINE FINDINGS Alignment: Normal. Skull base and vertebrae: No acute fracture. No primary bone lesion or focal pathologic process. Soft tissues and spinal canal: None Disc levels: Multi level degenerative  disc disease noted. Most advanced at C4-5, C5-6 and C6-7. Upper chest: Aortic atherosclerosis noted. Other: None IMPRESSION: 1. There is a moderate to large area of low attenuation involving the right temporal lobe and right occipital lobe compatible with an acute or subacute right PCA infarct. No hemorrhage identified and no significant mass effect. 2. Chronic left frontal lobe infarct with progressive global atrophy and chronic small vessel ischemic change. 3. No evidence for cervical spine fracture or dislocation. 4. Cervical spondylosis. 5. Critical Value/emergent results were called by telephone at the time of interpretation on 02/13/2018 at 12:24 pm to [facility], [facility], who verbally acknowledged these results. Electronically Signed   By: Kerby Moors M.D.   On: 02/13/2018 12:24   Ct Angio Chest Pe W And/or Wo Contrast  Result Date: 02/13/2018 CLINICAL DATA:  Chest pain and weakness EXAM: CT ANGIOGRAPHY CHEST WITH CONTRAST TECHNIQUE: Multidetector CT imaging of the chest was performed using the standard protocol during bolus administration of intravenous contrast. Multiplanar CT image reconstructions and MIPs were obtained to evaluate the vascular anatomy. CONTRAST:  164mL ISOVUE-370 IOPAMIDOL (ISOVUE-370) INJECTION 76% COMPARISON:  Chest radiograph August 24, 2010 FINDINGS: Cardiovascular: There is no demonstrable pulmonary embolus. The ascending thoracic aorta has a maximum transverse diameter of 4.3 x  4.3 cm. The measured diameter at the aortic arch is 3.6 cm. No dissection is evident. It must be cautioned that the contrast bolus in the aorta is not sufficient to exclude the possibility of dissection is a differential consideration. There is aortic atherosclerosis. There are a few scattered foci of calcification in visualized great vessels. There are foci of native coronary artery calcification evident. Patient is status post coronary artery bypass grafting. There is no pericardial effusion or pericardial thickening. The main pulmonary outflow tract measures 3.4 cm, enlarged. Mediastinum/Nodes: Visualized thyroid appears normal. There is no appreciable thoracic adenopathy. No esophageal lesions are evident. Lungs/Pleura: There is mild lower lobe atelectasis bilaterally. There is no lung edema or consolidation. No pleural effusion or pleural thickening evident. Upper Abdomen: In the visualized upper abdomen, there is atherosclerotic calcification in the aorta. There also scattered renal artery calcifications. Visualized upper abdominal structures otherwise appear unremarkable. Musculoskeletal: Patient is status post median sternotomy. No blastic or lytic bone lesions. No chest wall lesions evident. Review of the MIP images confirms the above findings. IMPRESSION: 1.  No demonstrable pulmonary embolus. 2. Prominence of the ascending thoracic aorta measuring 4.3 x 4.3 cm. Prominence at the arch level measuring 3.6 cm in diameter. Recommend annual imaging followup by CTA or MRA. This recommendation follows 2010 ACCF/AHA/AATS/ACR/ASA/SCA/SCAI/SIR/STS/SVM Guidelines for the Diagnosis and Management of Patients with Thoracic Aortic Disease. Circulation.2010; 121: C127-N170. No dissection evident. It must be cautioned that the contrast bolus in the aorta is insufficient to exclude dissection as a potential differential diagnostic consideration from a radiographic standpoint. There is aortic atherosclerosis. Patient is  status post coronary artery bypass grafting. 3. Prominence of the main pulmonary outflow tract measuring 3.4 cm, a finding felt to be indicative of a degree of pulmonary arterial hypertension. 4. No lung edema or consolidation. Areas of lower lobe atelectasis. 5.  No evident thoracic adenopathy. Aortic Atherosclerosis (ICD10-I70.0). Electronically Signed   By: Lowella Grip III M.D.   On: 02/13/2018 13:36   Ct Cervical Spine Wo Contrast  Result Date: 02/13/2018 CLINICAL DATA:  Status post fall.  Found on floor.  Weakness. EXAM: CT HEAD WITHOUT CONTRAST CT CERVICAL SPINE WITHOUT CONTRAST TECHNIQUE: Multidetector CT imaging of the head and  cervical spine was performed following the standard protocol without intravenous contrast. Multiplanar CT image reconstructions of the cervical spine were also generated. COMPARISON:  4/23/9 FINDINGS: CT HEAD FINDINGS Brain: There is a large asymmetric area of low attenuation involving the right temporal lobe and right occipital lobe which is new from the previous exam. Also new from the previous exam is left frontal lobe cortical and subcortical encephalomalacia compatible with a chronic infarct. There is moderate, progressive diffuse low-attenuation within the subcortical and periventricular white matter compatible with chronic microvascular disease. A chronic appearing lacunar infarct within the posterior right basal ganglia noted. Moderately progressive atrophy with prominence of the sulci and ventricles noted bilaterally. Vascular: Atherosclerotic calcifications noted. No hyperdense vessel or unexpected calcification noted. Skull: Normal. Negative for fracture or focal lesion. Sinuses/Orbits: No acute finding. Other: None CT CERVICAL SPINE FINDINGS Alignment: Normal. Skull base and vertebrae: No acute fracture. No primary bone lesion or focal pathologic process. Soft tissues and spinal canal: None Disc levels: Multi level degenerative disc disease noted. Most advanced at  C4-5, C5-6 and C6-7. Upper chest: Aortic atherosclerosis noted. Other: None IMPRESSION: 1. There is a moderate to large area of low attenuation involving the right temporal lobe and right occipital lobe compatible with an acute or subacute right PCA infarct. No hemorrhage identified and no significant mass effect. 2. Chronic left frontal lobe infarct with progressive global atrophy and chronic small vessel ischemic change. 3. No evidence for cervical spine fracture or dislocation. 4. Cervical spondylosis. 5. Critical Value/emergent results were called by telephone at the time of interpretation on 02/13/2018 at 12:24 pm to [facility], [place], who verbally acknowledged these results. Electronically Signed   By: Kerby Moors M.D.   On: 02/13/2018 12:24   Ct Thoracic Spine Wo Contrast  Result Date: 02/13/2018 CLINICAL DATA:  Back pain with minor trauma. Found on floor. Generalized weakness EXAM: CT THORACIC AND LUMBAR SPINE WITHOUT CONTRAST TECHNIQUE: Multidetector CT imaging of the thoracic and lumbar spine was performed without contrast. Multiplanar CT image reconstructions were also generated. COMPARISON:  None. FINDINGS: CT THORACIC SPINE FINDINGS Alignment: No traumatic malalignment. Mild thoracic dextrocurvature which could be positional. Vertebrae: Negative for fracture, endplate erosion, or aggressive bone lesion. Paraspinal and other soft tissues: Intrathoracic findings described on dedicated CT. Small intramuscular lipoma within the right trapezius, right para median. Disc levels: Minor spondylosis.  No visible cord impingement CT LUMBAR SPINE FINDINGS Segmentation: Articulation between the L5 left transverse process and sacrum. Alignment: Normal Vertebrae: Negative for fracture or visible bone lesion. Paraspinal and other soft tissues: No acute finding. Atherosclerosis with 27 mm diameter infrarenal aorta. Colonic diverticulosis. Disc levels: Minor disc bulging.  No impingement IMPRESSION: No acute finding in  thoracic or lumbar spine. Electronically Signed   By: Monte Fantasia M.D.   On: 02/13/2018 13:33   Ct Lumbar Spine Wo Contrast  Result Date: 02/13/2018 CLINICAL DATA:  Back pain with minor trauma. Found on floor. Generalized weakness EXAM: CT THORACIC AND LUMBAR SPINE WITHOUT CONTRAST TECHNIQUE: Multidetector CT imaging of the thoracic and lumbar spine was performed without contrast. Multiplanar CT image reconstructions were also generated. COMPARISON:  None. FINDINGS: CT THORACIC SPINE FINDINGS Alignment: No traumatic malalignment. Mild thoracic dextrocurvature which could be positional. Vertebrae: Negative for fracture, endplate erosion, or aggressive bone lesion. Paraspinal and other soft tissues: Intrathoracic findings described on dedicated CT. Small intramuscular lipoma within the right trapezius, right para median. Disc levels: Minor spondylosis.  No visible cord impingement CT LUMBAR SPINE FINDINGS Segmentation:  Articulation between the L5 left transverse process and sacrum. Alignment: Normal Vertebrae: Negative for fracture or visible bone lesion. Paraspinal and other soft tissues: No acute finding. Atherosclerosis with 27 mm diameter infrarenal aorta. Colonic diverticulosis. Disc levels: Minor disc bulging.  No impingement IMPRESSION: No acute finding in thoracic or lumbar spine. Electronically Signed   By: Monte Fantasia M.D.   On: 02/13/2018 13:33    Procedures .Critical Care Performed by: Lorayne Bender, PA-C Authorized by: Lorayne Bender, PA-C   Critical care provider statement:    Critical care time (minutes):  35   Critical care time was exclusive of:  Separately billable procedures and treating other patients   Critical care was necessary to treat or prevent imminent or life-threatening deterioration of the following conditions: New stroke; New Afib RVR.   Critical care was time spent personally by me on the following activities:  Obtaining history from patient or surrogate, development  of treatment plan with patient or surrogate, discussions with consultants, evaluation of patient's response to treatment, examination of patient, re-evaluation of patient's condition, pulse oximetry, ordering and review of radiographic studies, ordering and review of laboratory studies, ordering and performing treatments and interventions and review of old charts   I assumed direction of critical care for this patient from another provider in my specialty: no     (including critical care time)  Medications Ordered in ED Medications  iopamidol (ISOVUE-370) 76 % injection (has no administration in time range)  iopamidol (ISOVUE-370) 76 % injection 100 mL (has no administration in time range)  diltiazem (CARDIZEM) 1 mg/mL load via infusion 20 mg (20 mg Intravenous Bolus from Bag 02/13/18 1335)    And  diltiazem (CARDIZEM) 100 mg in dextrose 5% 140mL (1 mg/mL) infusion (5 mg/hr Intravenous New Bag/Given 02/13/18 1333)  sodium chloride 0.9 % bolus 500 mL (0 mLs Intravenous Stopped 02/13/18 1208)  morphine 4 MG/ML injection 4 mg (4 mg Intravenous Given 02/13/18 1124)  ondansetron (ZOFRAN) injection 4 mg (4 mg Intravenous Given 02/13/18 1124)  metoprolol tartrate (LOPRESSOR) injection 5 mg (5 mg Intravenous Given 02/13/18 1124)  morphine 4 MG/ML injection 4 mg (4 mg Intravenous Given 02/13/18 1208)  iopamidol (ISOVUE-370) 76 % injection 100 mL (100 mLs Intravenous Contrast Given 02/13/18 1235)     Initial Impression / Assessment and Plan / ED Course  I have reviewed the triage vital signs and the nursing notes.  Pertinent labs & imaging results that were available during my care of the patient were reviewed by me and considered in my medical decision making (see chart for details).  Clinical Course as of Feb 13 1405  Wed Feb 13, 2018  1302 Spoke with Dr. Cheral Marker. States abnormality on head CT has the appearance of a subacute stroke.  She will need to be admitted via the hospitalist and neurology will continue  to consult.   [SJ]  1321 Spoke with Dr. Steffanie Dunn, hospitalist. Agrees to admit the patient.   [SJ]    Clinical Course User Index [SJ] Dathan Attia C, PA-C   Patient presents with what initially sounded like a syncopal episode.  She was A. fib RVR on the monitor with no previous history of the same. She was eventually able to be rate controlled with diltiazem.  Patient has a new right PCA infarct noted on CT.  Her exam was made very difficult by her fixation on her back pain.  Last seen normal difficult to ascertain, but regardless she is out of the stroke  window.  No acute abnormalities on CT of thoracic and lumbar spine.  No evidence of PE or other acute abnormality noted on CT PE study of the chest. Admitted via the hospitalist.  Findings and plan of care discussed with Blanchie Dessert, MD. Dr. Maryan Rued personally evaluated and examined this patient.   Final Clinical Impressions(s) / ED Diagnoses   Final diagnoses:  Fall, initial encounter  Atrial fibrillation with RVR [facility])    ED Discharge Orders    None       Lorayne Bender, PA-C 02/13/18 1406    Lorayne Bender, PA-C 02/13/18 [place], MD 02/14/18 [phone]

## 2018-02-13 NOTE — H&P (Addendum)
History and Physical    Kimberly [name] OJJ:[id number] DOB: 1956-04-15 DOA: 02/13/2018  PCP: [name] Consultants:  Neurology, cardiology Patient coming from:  Home - lives alone; NOK: Kimberly [name], sister; Kimberly [name], daughter  Chief Complaint: Fall  HPI: Kimberly [name] is a 62 y.o. 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 Has L sided deficits at baseline since her prior CVA. Woke up at 6 am and felt weaker than normal in general. States she felt lightheaded while making her breakfast and then "passed out." When she fell she somehow hurt her back and in the ED her only complaint is severe mid and lower back pain (pain started after her fall, not before). She cannot say whether she lost consciousness; she does recall lying on the floor for a long time waiting on help, and that she could not get up herself. Per report, her neighbor(s) found her and called her daughter who called EMS. Upon their arrival she was lying prone and was conscious, complaining of back pain. She denies any preceding chest pain, dyspnea, headache, palpitations, nausea, vomiting, diaphoresis. She is asymptomatic at this point aside from her back pain.  ED Course: She was oriented x 4. In AF/RVR, rate 149, on arrival, asymptomatic. No CP, SOB. No h/o AF. Gave IVF, metoprolol, then later rate controlled at 137, asymptomatic. Dilt gtt started. Acute vs subacute PCA infarct on CT. Neuro looked at CT and thought subacute but pt needed to be admitted to medicine. Was not able to fully comply with her exam due to having had morphine. Positive D-dimer. CT chest done, neg for PE.  While I was evaluating patient, she converted to NSR. She remained hypertensive. F/u EKG showed NSR/sinus brady with PAC.   NIHSS 6 Modified Rankin 0  Review of Systems: As per HPI; otherwise review of systems reviewed and negative.   Ambulatory Status:  Ambulates with a cane  Past Medical History:  Diagnosis Date  . Bicipital tenosynovitis   . Coronary artery disease 05/2007   [id number]   . Family history of ischemic heart disease   . Gout   . Hammer toe   . Hip, thigh, leg, and ankle, insect bite, nonvenomous, without mention of infection(916.4)   . Hyperlipidemia   . Hypertension   . Other abnormality of red blood cells   . Rotator cuff syndrome of left shoulder   . Stroke [facility])     Past Surgical History:  Procedure Laterality Date  . CARDIAC CATHETERIZATION    . CORONARY ARTERY BYPASS GRAFT     triple  . CORONARY ARTERY BYPASS GRAFT  2008  . TUBAL LIGATION      Social History   Socioeconomic History  . Marital status: Single    Spouse name: Not on file  . Number of children: Not on file  . Years of education: Not on file  . Highest education level: Not on file  Occupational History  . Not on file  Social Needs  . Financial resource strain: Not on file  . Food insecurity:    Worry: Not on file    Inability: Not on file  . Transportation needs:    Medical: Not on file    Non-medical: Not on file  Tobacco Use  . Smoking status: Former Research scientist (life sciences)  . Smokeless tobacco: Former Systems developer    Quit date: 08/09/2008  Substance and Sexual Activity  . Alcohol use: Yes  Alcohol/week: 0.6 oz    Types: 1 Cans of beer per week  . Drug use: Not on file  . Sexual activity: Not on file  Lifestyle  . Physical activity:    Days per week: Not on file    Minutes per session: Not on file  . Stress: Not on file  Relationships  . Social connections:    Talks on phone: Not on file    Gets together: Not on file    Attends religious service: Not on file    Active member of club or organization: Not on file    Attends meetings of clubs or organizations: Not on file    Relationship status: Not on file  . Intimate partner violence:    Fear of current or ex partner: Not on file    Emotionally abused: Not on file    Physically abused:  Not on file    Forced sexual activity: Not on file  Other Topics Concern  . Not on file  Social History Narrative  . Not on file    Allergies  Allergen Reactions  . Clonidine Derivatives Other (See Comments)    Patch causes scars  . Spironolactone Rash    Family History  Problem Relation Age of Onset  . Hypertension Mother   . Heart attack Father   . Diabetes Father   . Anxiety disorder Sister   . Sarcoidosis Sister     Prior to Admission medications   Medication Sig Start Date End Date Taking? Authorizing Provider  aspirin 81 MG tablet Take 81 mg by mouth daily.      [provider]  Blood Pressure Monitoring (B-D ASSURE BPM/AUTO ARM CUFF) MISC 1 Device by Does not apply route once. 04/27/14   [name], MD  cloNIDine (CATAPRES) 0.1 MG tablet Take 1 tablet (0.1 mg total) by mouth 2 (two) times daily. 02/27/17   [name], MD  clopidogrel (PLAVIX) 75 MG tablet Take 1 tablet (75 mg total) by mouth daily. 03/27/17   [name], MD  colchicine 0.6 MG tablet Take 1 tablet (0.6 mg total) by mouth 2 (two) times daily. 12/14/16   [name], MD  feeding supplement, ENSURE COMPLETE, (ENSURE COMPLETE) LIQD Take 237 mLs by mouth 2 (two) times daily between meals. 01/30/17   [name], MD  ibuprofen (ADVIL,MOTRIN) 600 MG tablet Take 1 tablet (600 mg total) by mouth every 6 (six) hours as needed for mild pain or moderate pain. 12/26/15   Clayton Bibles, PA-C  indomethacin (INDOCIN) 50 MG capsule Take 50 mg by mouth 2 (two) times daily as needed (for gout flare ups).  12/08/15   [provider]  metoprolol succinate (TOPROL-XL) 100 MG 24 hr tablet Take 150-200mg  by mouth daily. Take with or immediately following a meal. 03/27/17   [name], MD  metroNIDAZOLE (FLAGYL) 500 MG tablet Take 1 tablet (500 mg total) by mouth 2 (two) times daily. 12/26/15   Clayton Bibles, PA-C  Multiple Vitamins-Minerals (MULTIVITAMIN WITH MINERALS) tablet Take 1 tablet by mouth  daily.      [provider]  quinapril (ACCUPRIL) 40 MG tablet Take 1 tablet (40 mg total) by mouth daily. MUST KEEP OV FOR ADDITIONAL REFILLS 12/18/17   [name], MD  rosuvastatin (CRESTOR) 10 MG tablet 1 tablet every other day at bedtime 03/27/17   [name], MD  verapamil (CALAN-SR) 240 MG CR tablet Take 1 tablet (240 mg total) by mouth daily. 03/27/17   [name], MD  Physical Exam: Vitals:   02/13/18 1115 02/13/18 1117 02/13/18 1215  BP: (!) 147/110 (!) 147/110 (!) 147/124  Pulse: (!) 149 (!) 133 (!) 137  Resp: (!) 24 17 14   Temp:  98.8 F (37.1 C)   TempSrc:  Oral   SpO2: 96% 100% 100%     . General: Lying flat in bed, sleepy, arousable. Slightly anxious when awoken.  . Eyes:  PERRL, EOMI, normal lids, iris . ENT: Grossly normal hearing, lips & tongue, mmm; appropriate dentition . Neck: no LAD, masses or thyromegaly; no carotid bruits . Cardiovascular: RRR, no m/r/g. No LE edema.  [name] Respiratory:  CTA bilaterally with no wheezes/rales/rhonchi.  Normal respiratory effort. . Abdomen: soft, NT, ND, NABS . Back:  normal alignment, no CVAT . Skin: no rash or induration seen on limited exam . Musculoskeletal: grossly normal tone BUE/BLE. Back tender along paraspinal muscles of thoracic and lumbar spine without point tenderness / step off . Lower extremity: No LE edema.  Limited foot exam with no ulcerations.  2+ distal pulses. [name] Psychiatric: sedated, slightly anxious when awake, no obvious dysarthria, oriented x 4 . Neurologic: L sided neglect. L facial droop. CN otherwise grossly intact. Would not cooperate with motor exam due to sedation from morphine and pt kept falling asleep. She was moving all extremities to some extent. No sensory exam as pt too sedated. DTRs 2+ and symmetric.   Radiological Exams on Admission: Ct Head Wo Contrast  Result Date: 02/13/2018 CLINICAL DATA:  Status post fall.  Found on floor.  Weakness. EXAM: CT HEAD WITHOUT CONTRAST CT  CERVICAL SPINE WITHOUT CONTRAST TECHNIQUE: Multidetector CT imaging of the head and cervical spine was performed following the standard protocol without intravenous contrast. Multiplanar CT image reconstructions of the cervical spine were also generated. COMPARISON:  4/23/9 FINDINGS: CT HEAD FINDINGS Brain: There is a large asymmetric area of low attenuation involving the right temporal lobe and right occipital lobe which is new from the previous exam. Also new from the previous exam is left frontal lobe cortical and subcortical encephalomalacia compatible with a chronic infarct. There is moderate, progressive diffuse low-attenuation within the subcortical and periventricular white matter compatible with chronic microvascular disease. A chronic appearing lacunar infarct within the posterior right basal ganglia noted. Moderately progressive atrophy with prominence of the sulci and ventricles noted bilaterally. Vascular: Atherosclerotic calcifications noted. No hyperdense vessel or unexpected calcification noted. Skull: Normal. Negative for fracture or focal lesion. Sinuses/Orbits: No acute finding. Other: None CT CERVICAL SPINE FINDINGS Alignment: Normal. Skull base and vertebrae: No acute fracture. No primary bone lesion or focal pathologic process. Soft tissues and spinal canal: None Disc levels: Multi level degenerative disc disease noted. Most advanced at C4-5, C5-6 and C6-7. Upper chest: Aortic atherosclerosis noted. Other: None IMPRESSION: 1. There is a moderate to large area of low attenuation involving the right temporal lobe and right occipital lobe compatible with an acute or subacute right PCA infarct. No hemorrhage identified and no significant mass effect. 2. Chronic left frontal lobe infarct with progressive global atrophy and chronic small vessel ischemic change. 3. No evidence for cervical spine fracture or dislocation. 4. Cervical spondylosis. 5. Critical Value/emergent results were called by  telephone at the time of interpretation on 02/13/2018 at 12:24 pm to [facility], [place], who verbally acknowledged these results. Electronically Signed   By: Kerby Moors M.D.   On: 02/13/2018 12:24   Ct Cervical Spine Wo Contrast  Result Date: 02/13/2018 CLINICAL DATA:  Status post fall.  Found on floor.  Weakness. EXAM: CT HEAD WITHOUT CONTRAST CT CERVICAL SPINE WITHOUT CONTRAST TECHNIQUE: Multidetector CT imaging of the head and cervical spine was performed following the standard protocol without intravenous contrast. Multiplanar CT image reconstructions of the cervical spine were also generated. COMPARISON:  4/23/9 FINDINGS: CT HEAD FINDINGS Brain: There is a large asymmetric area of low attenuation involving the right temporal lobe and right occipital lobe which is new from the previous exam. Also new from the previous exam is left frontal lobe cortical and subcortical encephalomalacia compatible with a chronic infarct. There is moderate, progressive diffuse low-attenuation within the subcortical and periventricular white matter compatible with chronic microvascular disease. A chronic appearing lacunar infarct within the posterior right basal ganglia noted. Moderately progressive atrophy with prominence of the sulci and ventricles noted bilaterally. Vascular: Atherosclerotic calcifications noted. No hyperdense vessel or unexpected calcification noted. Skull: Normal. Negative for fracture or focal lesion. Sinuses/Orbits: No acute finding. Other: None CT CERVICAL SPINE FINDINGS Alignment: Normal. Skull base and vertebrae: No acute fracture. No primary bone lesion or focal pathologic process. Soft tissues and spinal canal: None Disc levels: Multi level degenerative disc disease noted. Most advanced at C4-5, C5-6 and C6-7. Upper chest: Aortic atherosclerosis noted. Other: None IMPRESSION: 1. There is a moderate to large area of low attenuation involving the right temporal lobe and right occipital lobe compatible with  an acute or subacute right PCA infarct. No hemorrhage identified and no significant mass effect. 2. Chronic left frontal lobe infarct with progressive global atrophy and chronic small vessel ischemic change. 3. No evidence for cervical spine fracture or dislocation. 4. Cervical spondylosis. 5. Critical Value/emergent results were called by telephone at the time of interpretation on 02/13/2018 at 12:24 pm to [facility], [place], who verbally acknowledged these results. Electronically Signed   By: Kerby Moors M.D.   On: 02/13/2018 12:24    EKG: Independently reviewed. New A flutter with predominant 2:1 block Rate 146 Repol abnrm, prob ischemia, anterolateral lds Artifact in lead(s) I II III aVR aVL aVF V1 V2  Labs on Admission: I have personally reviewed the available labs and imaging studies at the time of the admission.  Pertinent labs:  Na 144 K 3.5 CO2 23 BUN 13 SCr 1.10 (baseline ~1) WBC 8.4 Hgb 12.4 Hct 40.3 Plt 218 D-dimer 0.84 Gluc 97 Trop 0.05 U/A WNL UDS: pos for opiates, THC   Assessment/Plan Principal Problem:   Cerebral artery occlusion with cerebral infarction [facility]) Active Problems:   Dyslipidemia   Essential hypertension   CAD, ARTERY BYPASS GRAFT   Atrial fibrillation with RVR (HCC)   Positive D dimer   Back pain   CVA (cerebral vascular accident) ([place])   Paroxysmal atrial fibrillation ([place])   Coronary artery disease involving native coronary artery of native heart without angina pectoris   Apical variant hypertrophic cardiomyopathy ([place])   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HCC)   Aortic atherosclerosis (HCC)   CVA, likely postacute -Neuro on board -HgbA1c, fasting lipid panel in a.m. -MRI/MRA of the brain without contrast -PT consult, OT consult, Speech consult -Echocardiogram -80 mg of Atorvastatin now, and daily (hold crestor) -Prophylactic therapy-Antiplatelet med: --Given her new AF and CHA2DS2-Vasc2 score of 5 as well as new embolic CVA, patient will need full  anticoagulation; however will continue aspirin and Plavix until stroke team sees her tomorrow and cards has consulted -Telemetry monitoring; EKG if reverts back to AF -Frequent neuro checks -NPO until passes stroke swallow screen  AF with RVR -no prior  history -TTE ordered -telemetry -pt is on Toprol XL "150-200 mg daily" - given that pt has been on dilt drip and has had lopressor and currently has rates in the 60s, will give short acting metoprolol 25 mg q6h and titrate; hold for HR <60 -continue verapamil -hold clonidine for now- med is on pt's med list but also listed as allergy (for patches). Pt has not had recent visit with cardiology or PCP. -dilt gtt as needed for A flutter -cycle troponins given concerns for ischemia on EKG -EKG in a.m. -check TSH -reconcile meds once sister arrives with bottles; pt has several AV nodal blockers on her med list and unsure if she's meant to be taking all of them  D-dimer elevated -likely elevated due to her fall -CTA neg for PE  HTN -cont metoprolol (as above), verapamil. Hold clonidine for now.  -Cont accupril  Dyslipidemia -hold crestor, start atorvastatin 80 mg daily   DVT prophylaxis:  Lovenox Code Status: Full - confirmed with patient Family CommunicationCalton [name] Sister [phone]  [phone]        -spoke with sister Kimberly [name], who was unaware of pt being in ED. She will obtain pt's medications and bring to hospital. Disposition Plan:  TBD once clinically improved Consults called: Neuro, cardiology  Admission status: Admit - It is my clinical opinion that admission to INPATIENT is reasonable and necessary because of the expectation that this patient will require hospital care that crosses at least 2 midnights to treat this condition based on the medical complexity of the problems presented.  Given the aforementioned information, the predictability of an adverse outcome is felt to be significant.    Janora Norlander  MD [facility]  If note is complete, please contact covering daytime or nighttime physician. [email or web address] Password [email or web address]  02/13/2018, 1:14 PM

## 2018-02-13 NOTE — ED Notes (Signed)
Pt transported to CT ?

## 2018-02-13 NOTE — ED Triage Notes (Signed)
Pt was found on the floor at home.  Pt woke up at 6am, feeling weaker than normal and passed out around [place]. Pt has been falling more frequently. Pt complaining lower back pain. Pt denies Cp/SOB. Pt Afib on monitor HR 160's initially. No hx of afib per EMS. BP 136/87. Pt is alert and oriented. Denies hitting head.

## 2018-02-14 ENCOUNTER — Inpatient Hospital Stay (HOSPITAL_COMMUNITY): Payer: Medicaid Other

## 2018-02-14 ENCOUNTER — Other Ambulatory Visit (HOSPITAL_COMMUNITY): Payer: Medicaid Other

## 2018-02-14 DIAGNOSIS — I639 Cerebral infarction, unspecified: Secondary | ICD-10-CM

## 2018-02-14 DIAGNOSIS — I6523 Occlusion and stenosis of bilateral carotid arteries: Secondary | ICD-10-CM

## 2018-02-14 DIAGNOSIS — I63531 Cerebral infarction due to unspecified occlusion or stenosis of right posterior cerebral artery: Secondary | ICD-10-CM

## 2018-02-14 DIAGNOSIS — I6350 Cerebral infarction due to unspecified occlusion or stenosis of unspecified cerebral artery: Secondary | ICD-10-CM

## 2018-02-14 DIAGNOSIS — I340 Nonrheumatic mitral (valve) insufficiency: Secondary | ICD-10-CM

## 2018-02-14 DIAGNOSIS — I361 Nonrheumatic tricuspid (valve) insufficiency: Secondary | ICD-10-CM

## 2018-02-14 LAB — BASIC METABOLIC PANEL
Anion gap: 7 (ref 5–15)
BUN: 15 mg/dL (ref 8–23)
CO2: 24 mmol/L (ref 22–32)
Calcium: 8.9 mg/dL (ref 8.9–10.3)
Chloride: 111 mmol/L (ref 98–111)
Creatinine, Ser: 0.92 mg/dL (ref 0.44–1.00)
GFR calc Af Amer: >60 mL/min (ref >60)
GFR calc non Af Amer: >60 mL/min (ref >60)
Glucose, Bld: 98 mg/dL (ref 70–99)
Potassium: 3.3 mmol/L — ABNORMAL LOW (ref 3.5–5.1)
Sodium: 142 mmol/L (ref 135–145)

## 2018-02-14 LAB — CBC
HCT: 35.8 % — ABNORMAL LOW (ref 36.0–46.0)
Hemoglobin: 11.1 g/dL — ABNORMAL LOW (ref 12.0–15.0)
MCH: 21.9 pg — ABNORMAL LOW (ref 26.0–34.0)
MCHC: 31 g/dL (ref 30.0–36.0)
MCV: 70.5 fL — ABNORMAL LOW (ref 78.0–100.0)
Platelets: 171 10*3/uL (ref 150–400)
RBC: 5.08 MIL/uL (ref 3.87–5.11)
RDW: 15.8 % — ABNORMAL HIGH (ref 11.5–15.5)
WBC: 7.7 10*3/uL (ref 4.0–10.5)

## 2018-02-14 LAB — LIPID PANEL
Cholesterol: 160 mg/dL (ref 0–200)
HDL: 44 mg/dL (ref >40)
LDL Cholesterol: 100 mg/dL — ABNORMAL HIGH (ref 0–99)
Total CHOL/HDL Ratio: 3.6 RATIO
Triglycerides: 78 mg/dL (ref <150)
VLDL: 16 mg/dL (ref 0–40)

## 2018-02-14 LAB — HEMOGLOBIN A1C
Hgb A1c MFr Bld: 5.8 % — ABNORMAL HIGH (ref 4.8–5.6)
Mean Plasma Glucose: 119.76 mg/dL

## 2018-02-14 LAB — HIV ANTIBODY (ROUTINE TESTING W REFLEX): HIV Screen 4th Generation wRfx: NONREACTIVE

## 2018-02-14 MED ORDER — METOPROLOL TARTRATE 25 MG PO TABS: 25.0000 mg | ORAL_TABLET | Freq: Two times a day (BID) | ORAL | Status: DC

## 2018-02-14 MED ORDER — POTASSIUM CHLORIDE CRYS ER 20 MEQ PO TBCR
40.0000 meq | EXTENDED_RELEASE_TABLET | Freq: Once | ORAL | Status: AC
  Administered 2018-02-14: 40 meq via ORAL
  Filled 2018-02-14: qty 2

## 2018-02-14 MED ORDER — IOPAMIDOL (ISOVUE-370) INJECTION 76%
50.0000 mL | Freq: Once | INTRAVENOUS | Status: AC | PRN
  Administered 2018-02-14: 50 mL via INTRAVENOUS

## 2018-02-14 MED ORDER — IOPAMIDOL (ISOVUE-370) INJECTION 76%
INTRAVENOUS | Status: AC
  Filled 2018-02-14: qty 50

## 2018-02-14 MED ORDER — ASPIRIN EC 325 MG PO TBEC
325.0000 mg | DELAYED_RELEASE_TABLET | Freq: Every day | ORAL | Status: DC
  Administered 2018-02-15 – 2018-02-18 (×3): 325 mg via ORAL
  Filled 2018-02-14 (×4): qty 1

## 2018-02-14 MED ORDER — OXYCODONE HCL 5 MG PO TABS
5.0000 mg | ORAL_TABLET | ORAL | Status: DC | PRN
  Administered 2018-02-14 – 2018-02-16 (×8): 5 mg via ORAL
  Filled 2018-02-14 (×8): qty 1

## 2018-02-14 MED ORDER — MORPHINE SULFATE (PF) 2 MG/ML IV SOLN
2.0000 mg | Freq: Once | INTRAVENOUS | Status: AC
  Administered 2018-02-14: 2 mg via INTRAVENOUS
  Filled 2018-02-14: qty 1

## 2018-02-14 NOTE — Progress Notes (Addendum)
PROGRESS NOTE        PATIENT DETAILS Name: Kimberly [name] Age: 62 y.o. Sex: female Date of Birth: 1956-03-06 Admit Date: 02/13/2018 Admitting Physician Janora Norlander, MD WER:[id number], Doreene Burke  Brief Narrative: Patient is a 62 y.o. female with history of CAD status post CABG, prior CVA, dyslipidemia, gout who presented with dizziness, and subsequently sustained a mechanical fall subsequently she started complaining of back pain.  She was brought to the emergency room, where a CT of the head rated a subacute right PCA infarct.  Patient was then admitted to the hospitalist service for further evaluation and treatment.  See below for further details  Subjective: Sleeping when I walked in earlier this morning-easily awakes.  Continues to have back pain-but denies any chest pain or shortness of breath.  Assessment/Plan: Subacute right PCA infarct: Apparently had dizziness/lightheadedness and her "legs" gave out.  CT head on admission showed a right subacute PCA infarct.  Called by ultrasound technician, patient apparently has bilateral internal carotid artery occlusion, with possible/thrombus on the left side.  Subsequently spoke with stroke MD-Dr. Erlinda Hong have ordered CT angiogram of the head and neck.  Awaiting echocardiogram as well.  LDL 100, A1c 5.8.  EKG on admission showed A. fib with RVR.  Currently on dual antiplatelets and high intensity statin.  Await further work-up and await further input from neurology.  A. fib with RVR: Rate better controlled-remains on metoprolol-not yet started on anticoagulation as full stroke work-up in progress.  Remains on dual antiplatelet agents.  Await echocardiogram-cardiology following.  Back pain: Probably secondary to fall-likely musculoskeletal etiology-CT of the cervical, thoracic and lumbar spine negative for acute fractures.  Supportive care for now  Hypertrophic cardiomyopathy: Per cardiology EKG changes very suggestive of  hypertrophic cardiomyopathy-awaiting echocardiogram.  Hypokalemia: Replete and recheck.  Hypertension: Blood pressure reasonably well controlled-but will need to allow permissive hypertension-especially in light of possible bilateral internal carotid occlusion-continue metoprolol-especially will require control of A. fib.  But will go ahead and discontinue clonidine, Accupril and verapamil for now.  Dyslipidemia: Previously on Crestor 10 mg-switch to 80 mg of Lipitor due to CVA.  Gout: No evidence of flare-continue with colchicine.  Ascending aortic aneurysm: Stable for outpatient follow-up with her primary outpatient MD.  DVT Prophylaxis: Prophylactic Lovenox   Code Status: Full code   Family Communication: None at bedside  Disposition Plan: Remain inpatient-we will require several more days of hospitalization-await physical therapy services to determine appropriate disposition as well.  Antimicrobial agents: Anti-infectives (From admission, onward)   None      Procedures: None  CONSULTS: Neurology  Time spent: 35 minutes-Greater than 50% of this time was spent in counseling, explanation of diagnosis, planning of further management, and coordination of care.  MEDICATIONS: Scheduled Meds: . aspirin  81 mg Oral Daily  . atorvastatin  80 mg Oral q1800  . cloNIDine  0.1 mg Oral BID  . clopidogrel  75 mg Oral Daily  . colchicine  0.6 mg Oral BID  . enoxaparin (LOVENOX) injection  40 mg Subcutaneous Q24H  . feeding supplement (ENSURE ENLIVE)  237 mL Oral BID BM  . metoprolol tartrate  25 mg Oral Q6H  . multivitamin with minerals  1 tablet Oral Daily  . quinapril  40 mg Oral Daily  . verapamil  240 mg Oral Daily   Continuous Infusions: . sodium chloride 125  mL/hr at 02/14/18 0035   PRN Meds:.acetaminophen **OR** acetaminophen (TYLENOL) oral liquid 160 mg/5 mL **OR** acetaminophen, senna-docusate   PHYSICAL EXAM: Vital signs: Vitals:   02/14/18 0148 02/14/18  0338 02/14/18 0822 02/14/18 0859  BP: (!) 132/97 105/84 (!) 120/93 (!) 120/93  Pulse:  (!) 57 (!) 55 (!) 123  Resp:  16 15   Temp:  97.7 F (36.5 C) 97.8 F (36.6 C)   TempSrc:  Axillary Oral   SpO2:  100% 100%   Weight:       Filed Weights   02/13/18 1500  Weight: 51.5 kg (113 lb 8.6 oz)   Body mass index is 18.33 kg/m.   General appearance :Awake, alert, not in any distress.  Eyes:Pink conjunctiva HEENT: Atraumatic and Normocephalic Neck: supple Resp:Good air entry bilaterally, no added sounds  CVS: S1 S2 regular, systolic murmur  GI: Bowel sounds present, Non tender and not distended with no gaurding, rigidity or rebound.No organomegaly Extremities: B/L Lower Ext shows no edema, both legs are warm to touch Neurology: All 4 extremities-no focal deficits  Psychiatric: Normal judgment and insight. Alert and oriented x 3. Normal mood. Musculoskeletal:No digital cyanosis Skin:No Rash, warm and dry Wounds:N/A  I have personally reviewed following labs and imaging studies  LABORATORY DATA: CBC: Recent Labs  Lab 02/13/18 1125 02/14/18 0314  WBC 8.4 7.7  NEUTROABS 5.7  --   HGB 12.4 11.1*  HCT 40.3 35.8*  MCV 70.8* 70.5*  PLT 218 235    Basic Metabolic Panel: Recent Labs  Lab 02/13/18 1125 02/14/18 0314  NA 144 142  K 3.5 3.3*  CL 107 111  CO2 23 24  GLUCOSE 97 98  BUN 13 15  CREATININE 1.10* 0.92  CALCIUM 9.9 8.9    GFR: CrCl cannot be calculated (Unknown ideal weight.).  Liver Function Tests: Recent Labs  Lab 02/13/18 1125  AST 35  ALT 27  ALKPHOS 81  BILITOT 1.0  PROT 6.9  ALBUMIN 4.0   No results for input(s): LIPASE, AMYLASE in the last 168 hours. No results for input(s): AMMONIA in the last 168 hours.  Coagulation Profile: No results for input(s): INR, PROTIME in the last 168 hours.  Cardiac Enzymes: Recent Labs  Lab 02/13/18 1545 02/13/18 2118  TROPONINI 0.08* 0.07*    BNP (last 3 results) No results for input(s): PROBNP in  the last 8760 hours.  HbA1C: Recent Labs    02/14/18 0314  HGBA1C 5.8*    CBG: No results for input(s): GLUCAP in the last 168 hours.  Lipid Profile: Recent Labs    02/14/18 0314  CHOL 160  HDL 44  LDLCALC 100*  TRIG 78  CHOLHDL 3.6    Thyroid Function Tests: Recent Labs    02/13/18 1125  TSH 1.245    Anemia Panel: No results for input(s): VITAMINB12, FOLATE, FERRITIN, TIBC, IRON, RETICCTPCT in the last 72 hours.  Urine analysis:    Component Value Date/Time   COLORURINE YELLOW 02/13/2018 1209   APPEARANCEUR CLEAR 02/13/2018 1209   LABSPEC 1.014 02/13/2018 1209   PHURINE 5.0 02/13/2018 1209   GLUCOSEU NEGATIVE 02/13/2018 1209   HGBUR SMALL (A) 02/13/2018 1209   BILIRUBINUR NEGATIVE 02/13/2018 1209   KETONESUR 5 (A) 02/13/2018 1209   PROTEINUR 100 (A) 02/13/2018 1209   UROBILINOGEN 0.2 05/26/2011 2243   NITRITE NEGATIVE 02/13/2018 1209   LEUKOCYTESUR NEGATIVE 02/13/2018 1209    Sepsis Labs: Lactic Acid, Venous No results found for: LATICACIDVEN  MICROBIOLOGY: No results found for this  or any previous visit (from the past 240 hour(s)).  RADIOLOGY STUDIES/RESULTS: Dg Shoulder Right  Result Date: 02/13/2018 CLINICAL DATA:  [place] at home today BILATERAL shoulder pain worse on LEFT than RIGHT EXAM: RIGHT SHOULDER - 2+ VIEW COMPARISON:  None FINDINGS: Osseous mineralization diffusely decreased. AC joint alignment normal. No acute fracture, dislocation, or bone destruction. Visualized RIGHT ribs intact. IMPRESSION: No acute osseous abnormalities. Electronically Signed   By: Lavonia Dana M.D.   On: 02/13/2018 14:50   Ct Head Wo Contrast  Result Date: 02/13/2018 CLINICAL DATA:  Status post fall.  Found on floor.  Weakness. EXAM: CT HEAD WITHOUT CONTRAST CT CERVICAL SPINE WITHOUT CONTRAST TECHNIQUE: Multidetector CT imaging of the head and cervical spine was performed following the standard protocol without intravenous contrast. Multiplanar CT image reconstructions of  the cervical spine were also generated. COMPARISON:  4/23/9 FINDINGS: CT HEAD FINDINGS Brain: There is a large asymmetric area of low attenuation involving the right temporal lobe and right occipital lobe which is new from the previous exam. Also new from the previous exam is left frontal lobe cortical and subcortical encephalomalacia compatible with a chronic infarct. There is moderate, progressive diffuse low-attenuation within the subcortical and periventricular white matter compatible with chronic microvascular disease. A chronic appearing lacunar infarct within the posterior right basal ganglia noted. Moderately progressive atrophy with prominence of the sulci and ventricles noted bilaterally. Vascular: Atherosclerotic calcifications noted. No hyperdense vessel or unexpected calcification noted. Skull: Normal. Negative for fracture or focal lesion. Sinuses/Orbits: No acute finding. Other: None CT CERVICAL SPINE FINDINGS Alignment: Normal. Skull base and vertebrae: No acute fracture. No primary bone lesion or focal pathologic process. Soft tissues and spinal canal: None Disc levels: Multi level degenerative disc disease noted. Most advanced at C4-5, C5-6 and C6-7. Upper chest: Aortic atherosclerosis noted. Other: None IMPRESSION: 1. There is a moderate to large area of low attenuation involving the right temporal lobe and right occipital lobe compatible with an acute or subacute right PCA infarct. No hemorrhage identified and no significant mass effect. 2. Chronic left frontal lobe infarct with progressive global atrophy and chronic small vessel ischemic change. 3. No evidence for cervical spine fracture or dislocation. 4. Cervical spondylosis. 5. Critical Value/emergent results were called by telephone at the time of interpretation on 02/13/2018 at 12:24 pm to [facility], [place], who verbally acknowledged these results. Electronically Signed   By: Kerby Moors M.D.   On: 02/13/2018 12:24   Ct Angio Chest Pe W  And/or Wo Contrast  Result Date: 02/13/2018 CLINICAL DATA:  Chest pain and weakness EXAM: CT ANGIOGRAPHY CHEST WITH CONTRAST TECHNIQUE: Multidetector CT imaging of the chest was performed using the standard protocol during bolus administration of intravenous contrast. Multiplanar CT image reconstructions and MIPs were obtained to evaluate the vascular anatomy. CONTRAST:  167mL ISOVUE-370 IOPAMIDOL (ISOVUE-370) INJECTION 76% COMPARISON:  Chest radiograph August 24, 2010 FINDINGS: Cardiovascular: There is no demonstrable pulmonary embolus. The ascending thoracic aorta has a maximum transverse diameter of 4.3 x 4.3 cm. The measured diameter at the aortic arch is 3.6 cm. No dissection is evident. It must be cautioned that the contrast bolus in the aorta is not sufficient to exclude the possibility of dissection is a differential consideration. There is aortic atherosclerosis. There are a few scattered foci of calcification in visualized great vessels. There are foci of native coronary artery calcification evident. Patient is status post coronary artery bypass grafting. There is no pericardial effusion or pericardial thickening. The main pulmonary outflow  tract measures 3.4 cm, enlarged. Mediastinum/Nodes: Visualized thyroid appears normal. There is no appreciable thoracic adenopathy. No esophageal lesions are evident. Lungs/Pleura: There is mild lower lobe atelectasis bilaterally. There is no lung edema or consolidation. No pleural effusion or pleural thickening evident. Upper Abdomen: In the visualized upper abdomen, there is atherosclerotic calcification in the aorta. There also scattered renal artery calcifications. Visualized upper abdominal structures otherwise appear unremarkable. Musculoskeletal: Patient is status post median sternotomy. No blastic or lytic bone lesions. No chest wall lesions evident. Review of the MIP images confirms the above findings. IMPRESSION: 1.  No demonstrable pulmonary embolus. 2.  Prominence of the ascending thoracic aorta measuring 4.3 x 4.3 cm. Prominence at the arch level measuring 3.6 cm in diameter. Recommend annual imaging followup by CTA or MRA. This recommendation follows 2010 ACCF/AHA/AATS/ACR/ASA/SCA/SCAI/SIR/STS/SVM Guidelines for the Diagnosis and Management of Patients with Thoracic Aortic Disease. Circulation.2010; 121: W098-J191. No dissection evident. It must be cautioned that the contrast bolus in the aorta is insufficient to exclude dissection as a potential differential diagnostic consideration from a radiographic standpoint. There is aortic atherosclerosis. Patient is status post coronary artery bypass grafting. 3. Prominence of the main pulmonary outflow tract measuring 3.4 cm, a finding felt to be indicative of a degree of pulmonary arterial hypertension. 4. No lung edema or consolidation. Areas of lower lobe atelectasis. 5.  No evident thoracic adenopathy. Aortic Atherosclerosis (ICD10-I70.0). Electronically Signed   By: Lowella Grip III M.D.   On: 02/13/2018 13:36   Ct Cervical Spine Wo Contrast  Result Date: 02/13/2018 CLINICAL DATA:  Status post fall.  Found on floor.  Weakness. EXAM: CT HEAD WITHOUT CONTRAST CT CERVICAL SPINE WITHOUT CONTRAST TECHNIQUE: Multidetector CT imaging of the head and cervical spine was performed following the standard protocol without intravenous contrast. Multiplanar CT image reconstructions of the cervical spine were also generated. COMPARISON:  4/23/9 FINDINGS: CT HEAD FINDINGS Brain: There is a large asymmetric area of low attenuation involving the right temporal lobe and right occipital lobe which is new from the previous exam. Also new from the previous exam is left frontal lobe cortical and subcortical encephalomalacia compatible with a chronic infarct. There is moderate, progressive diffuse low-attenuation within the subcortical and periventricular white matter compatible with chronic microvascular disease. A chronic  appearing lacunar infarct within the posterior right basal ganglia noted. Moderately progressive atrophy with prominence of the sulci and ventricles noted bilaterally. Vascular: Atherosclerotic calcifications noted. No hyperdense vessel or unexpected calcification noted. Skull: Normal. Negative for fracture or focal lesion. Sinuses/Orbits: No acute finding. Other: None CT CERVICAL SPINE FINDINGS Alignment: Normal. Skull base and vertebrae: No acute fracture. No primary bone lesion or focal pathologic process. Soft tissues and spinal canal: None Disc levels: Multi level degenerative disc disease noted. Most advanced at C4-5, C5-6 and C6-7. Upper chest: Aortic atherosclerosis noted. Other: None IMPRESSION: 1. There is a moderate to large area of low attenuation involving the right temporal lobe and right occipital lobe compatible with an acute or subacute right PCA infarct. No hemorrhage identified and no significant mass effect. 2. Chronic left frontal lobe infarct with progressive global atrophy and chronic small vessel ischemic change. 3. No evidence for cervical spine fracture or dislocation. 4. Cervical spondylosis. 5. Critical Value/emergent results were called by telephone at the time of interpretation on 02/13/2018 at 12:24 pm to [facility], [place], who verbally acknowledged these results. Electronically Signed   By: Kerby Moors M.D.   On: 02/13/2018 12:24   Ct Thoracic Spine Wo  Contrast  Result Date: 02/13/2018 CLINICAL DATA:  Back pain with minor trauma. Found on floor. Generalized weakness EXAM: CT THORACIC AND LUMBAR SPINE WITHOUT CONTRAST TECHNIQUE: Multidetector CT imaging of the thoracic and lumbar spine was performed without contrast. Multiplanar CT image reconstructions were also generated. COMPARISON:  None. FINDINGS: CT THORACIC SPINE FINDINGS Alignment: No traumatic malalignment. Mild thoracic dextrocurvature which could be positional. Vertebrae: Negative for fracture, endplate erosion, or  aggressive bone lesion. Paraspinal and other soft tissues: Intrathoracic findings described on dedicated CT. Small intramuscular lipoma within the right trapezius, right para median. Disc levels: Minor spondylosis.  No visible cord impingement CT LUMBAR SPINE FINDINGS Segmentation: Articulation between the L5 left transverse process and sacrum. Alignment: Normal Vertebrae: Negative for fracture or visible bone lesion. Paraspinal and other soft tissues: No acute finding. Atherosclerosis with 27 mm diameter infrarenal aorta. Colonic diverticulosis. Disc levels: Minor disc bulging.  No impingement IMPRESSION: No acute finding in thoracic or lumbar spine. Electronically Signed   By: Monte Fantasia M.D.   On: 02/13/2018 13:33   Ct Lumbar Spine Wo Contrast  Result Date: 02/13/2018 CLINICAL DATA:  Back pain with minor trauma. Found on floor. Generalized weakness EXAM: CT THORACIC AND LUMBAR SPINE WITHOUT CONTRAST TECHNIQUE: Multidetector CT imaging of the thoracic and lumbar spine was performed without contrast. Multiplanar CT image reconstructions were also generated. COMPARISON:  None. FINDINGS: CT THORACIC SPINE FINDINGS Alignment: No traumatic malalignment. Mild thoracic dextrocurvature which could be positional. Vertebrae: Negative for fracture, endplate erosion, or aggressive bone lesion. Paraspinal and other soft tissues: Intrathoracic findings described on dedicated CT. Small intramuscular lipoma within the right trapezius, right para median. Disc levels: Minor spondylosis.  No visible cord impingement CT LUMBAR SPINE FINDINGS Segmentation: Articulation between the L5 left transverse process and sacrum. Alignment: Normal Vertebrae: Negative for fracture or visible bone lesion. Paraspinal and other soft tissues: No acute finding. Atherosclerosis with 27 mm diameter infrarenal aorta. Colonic diverticulosis. Disc levels: Minor disc bulging.  No impingement IMPRESSION: No acute finding in thoracic or lumbar spine.  Electronically Signed   By: Monte Fantasia M.D.   On: 02/13/2018 13:33   Dg Shoulder Left  Result Date: 02/13/2018 CLINICAL DATA:  [place] [address] at home today BILATERAL shoulder pain worse on LEFT than RIGHT EXAM: LEFT SHOULDER - 2+ VIEW COMPARISON:  Rif 12/01/2008 FINDINGS: Osseous demineralization. AC joint alignment normal. No acute fracture, dislocation, or bone destruction. Small calcified ossicle lateral to the acromion appears stable. Visualized LEFT ribs intact. Atherosclerotic calcifications aorta and postsurgical changes of CABG noted. IMPRESSION: No acute LEFT shoulder abnormalities. Electronically Signed   By: Lavonia Dana M.D.   On: 02/13/2018 14:49     LOS: 1 day   Oren Binet, MD  [facility]  If 7PM-7AM, please contact night-coverage  Please page via [email or web address] [email or web address]-click on MD name and type text message  02/14/2018, 10:07 AM

## 2018-02-14 NOTE — Evaluation (Signed)
Speech Language Pathology Evaluation Patient Details Name: Kimberly [name] MRN: [id number] DOB: [birth year] Today's Date: 02/14/2018 Time: 4235-3614 SLP Time Calculation (min) (ACUTE ONLY): 27 min  Problem List:  Patient Active Problem List   Diagnosis Date Noted  . Atrial fibrillation with RVR ([place]) 02/13/2018  . Positive D dimer 02/13/2018  . Back pain 02/13/2018  . CVA (cerebral vascular accident) ([place]) 02/13/2018  . Paroxysmal atrial fibrillation ([place]) 02/13/2018  . Coronary artery disease involving native coronary artery of native heart without angina pectoris 02/13/2018  . Apical variant hypertrophic cardiomyopathy ([place]) 02/13/2018  . 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 Aortic atherosclerosis ([place]) 02/13/2018  . CAD, ARTERY BYPASS GRAFT 05/03/2009  . TOBACCO USE, QUIT 05/03/2009  . ROTATOR CUFF SYNDROME, LEFT 02/12/2009  . SHOULDER PAIN, LEFT 11/03/2008  . HAMMER TOE, ACQUIRED 02/11/2008  . INSECT BITE, LEG 02/11/2008  . Cerebral artery occlusion with cerebral infarction ([place]) 12/05/2007  . Dyslipidemia 11/05/2007  . UNSPECIFIED DISORDER TEETH&SUPPORTING STRUCTURES 11/05/2007  . BICEPS TENDINITIS, RIGHT 04/24/2007  . Essential hypertension 04/23/2007  . MICROCYTOSIS 04/23/2007   Past Medical History:  Past Medical History:  Diagnosis Date  . Bicipital tenosynovitis   . Coronary artery disease 05/2007   [id number]   . Fall 02/13/2018  . Family history of ischemic heart disease   . Gout   . Hammer toe   . Hip, thigh, leg, and ankle, insect bite, nonvenomous, without mention of infection(916.4)   . Hyperlipidemia   . Hypertension   . Other abnormality of red blood cells   . Rotator cuff syndrome of left shoulder   . Stroke [facility])    Past Surgical History:  Past Surgical History:  Procedure Laterality Date  . CARDIAC CATHETERIZATION    . CORONARY ARTERY BYPASS GRAFT     triple  . CORONARY ARTERY BYPASS GRAFT  2008  . TUBAL LIGATION     HPI:  62 yo female  adm to [facility] with dizziness, fall, speech changes and left sided weakness.  PMH + for CVA 2009, HTN, prior smoker stop 2008, CAD s/p CABG 2008, rapid ventricular response.  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  Pt admits to prior h/o memory deficits.   She lives alone and manages all of her home duties independently.  11th grade education.    Assessment / Plan / Recommendation Clinical Impression  Pt presents with cognitive linguistic deficits impacting attention, problem solving, memory and having specific difficulty recalling numbers - cognitive deficits likely exacerbated by pain.   She is oriented x4 and no aphasia, dysarthria apparent.  Pt demonstrates delayed responses and left inattention reporting blurry vision.  She will benefit from skilled SLP to maximize cognitive linguistic abilities, recommend consider CIR if pt will have level of support needed at dc. MOCA not given due to pt's level of pain limiting participation.    Pt educated to findings and agreeable to plan.      SLP Assessment  SLP Recommendation/Assessment: Patient needs continued Speech Lanaguage Pathology Services SLP Visit Diagnosis: Cognitive communication deficit (R41.841)    Follow Up Recommendations  Outpatient SLP;Home health SLP;Inpatient Rehab    Frequency and Duration min 2x/week  1 week      SLP Evaluation Cognition  Overall Cognitive Status: Impaired/Different from baseline Arousal/Alertness: Awake/alert Orientation Level: Oriented X4 Attention: Sustained Sustained Attention: Impaired Sustained Attention Impairment: Verbal complex(decreased attention to left) Memory: Impaired Memory Impairment: Decreased recall of new information(slp bringing ice pack to pt)  Awareness: Impaired Awareness Impairment: Intellectual impairment(recognizes need to call for assistance but not adequately aware to extent of weakness) Problem Solving: Impaired Problem Solving  Impairment: Functional basic(did not ask for SlP to call for RN, delayed processing of information, ? delayed initiation) Reasoning: Appears intact Safety/Judgment: Other (comment)       Comprehension  Auditory Comprehension Overall Auditory Comprehension: Appears within functional limits for tasks assessed Yes/No Questions: Within Functional Limits Basic Biographical Questions: 76-100% accurate Basic Immediate Environment Questions: Not tested Commands: Impaired One Step Basic Commands: 75-100% accurate Two Step Basic Commands: 75-100% accurate Multistep Basic Commands: 50-74% accurate(2/3 steps due to decreased attention) Interfering Components: Attention;Motor planning;Pain;Processing speed;Visual impairments;Working Field seismologist: English as a second language teacher time;Repetition Reading Comprehension Reading Status: Not tested(pt states everything is blurry, inattention to left)    Expression Expression Primary Mode of Expression: Verbal Verbal Expression Overall Verbal Expression: Appears within functional limits for tasks assessed Initiation: No impairment Repetition: No impairment(complex sentences WFL) Naming: Not tested Pragmatics: No impairment Interfering Components: Attention Written Expression Dominant Hand: Right Written Expression: Not tested   Oral / Motor  Oral Motor/Sensory Function Overall Oral Motor/Sensory Function: Mild impairment Facial ROM: Reduced left Facial Symmetry: Abnormal symmetry left Facial Strength: Reduced left Facial Sensation: Reduced left Lingual ROM: Within Functional Limits Lingual Symmetry: Within Functional Limits Lingual Strength: Within Functional Limits Lingual Sensation: Within Functional Limits Velum: Within Functional Limits Mandible: Other (Comment) Motor Speech Overall Motor Speech: Appears within functional limits for tasks assessed Phonation: Low vocal intensity(mildly decreased vocal intensity, may be due to  pain) Resonance: Within functional limits Articulation: Within functional limitis Intelligibility: Intelligible Motor Planning: Witnin functional limits Motor Speech Errors: Not applicable   GO                    Macario Golds 02/14/2018, 9:24 AM  Luanna Salk, [place] [facility] SLP [phone]

## 2018-02-14 NOTE — Progress Notes (Signed)
STROKE TEAM PROGRESS NOTE   SUBJECTIVE (INTERVAL HISTORY) Her daughter and RN are at the bedside.  Overall she feels her condition is unchanged. 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 Right arm and leg strong. Left arm and leg lack of effort on exam due to pain. CTA head and neck showed b/l ICA occlusion and right PCA occlusion. MRI pending. Had Afib RVR in ER but now spontaneously reversed back to NSR.   OBJECTIVE Temp:  [97.6 F (36.4 C)-98 F (36.7 C)] 97.8 F (36.6 C) (07/04 0822) Pulse Rate:  [55-138] 123 (07/04 0859) Cardiac Rhythm: Normal sinus rhythm (07/04 0700) Resp:  [14-19] 15 (07/04 0822) BP: (105-174)/(84-124) 120/93 (07/04 0859) SpO2:  [94 %-100 %] 100 % (07/04 0822) Weight:  [113 lb 8.6 oz (51.5 kg)] 113 lb 8.6 oz (51.5 kg) (07/03 1500)  No results for input(s): GLUCAP in the last 168 hours. Recent Labs  Lab 02/13/18 1125 02/14/18 0314  NA 144 142  K 3.5 3.3*  CL 107 111  CO2 23 24  GLUCOSE 97 98  BUN 13 15  CREATININE 1.10* 0.92  CALCIUM 9.9 8.9   Recent Labs  Lab 02/13/18 1125  AST 35  ALT 27  ALKPHOS 81  BILITOT 1.0  PROT 6.9  ALBUMIN 4.0   Recent Labs  Lab 02/13/18 1125 02/14/18 0314  WBC 8.4 7.7  NEUTROABS 5.7  --   HGB 12.4 11.1*  HCT 40.3 35.8*  MCV 70.8* 70.5*  PLT 218 171   Recent Labs  Lab 02/13/18 1545 02/13/18 2118  TROPONINI 0.08* 0.07*   No results for input(s): LABPROT, INR in the last 72 hours. Recent Labs    02/13/18 1209  COLORURINE YELLOW  LABSPEC 1.014  PHURINE 5.0  GLUCOSEU NEGATIVE  HGBUR SMALL*  BILIRUBINUR NEGATIVE  KETONESUR 5*  PROTEINUR 100*  NITRITE NEGATIVE  LEUKOCYTESUR NEGATIVE       Component Value Date/Time   CHOL 160 02/14/2018 0314   TRIG 78 02/14/2018 0314   HDL 44 02/14/2018 0314   CHOLHDL 3.6 02/14/2018 0314   VLDL 16 02/14/2018 0314   LDLCALC 100 (H) 02/14/2018 0314   Lab Results  Component Value Date   HGBA1C 5.8 (H) 02/14/2018      Component Value Date/Time   LABOPIA  POSITIVE (A) 02/13/2018 1209   COCAINSCRNUR NONE DETECTED 02/13/2018 1209   COCAINSCRNUR NEGATIVE 12/06/2007 1432   LABBENZ NONE DETECTED 02/13/2018 1209   LABBENZ NEGATIVE 12/06/2007 1432   AMPHETMU NONE DETECTED 02/13/2018 1209   THCU POSITIVE (A) 02/13/2018 1209   LABBARB (A) 02/13/2018 1209    Result not available. Reagent lot number recalled by manufacturer.    Recent Labs  Lab 02/13/18 [place] <10    I have personally reviewed the radiological images below and agree with the radiology interpretations.  Ct Angio Head W Or Wo Contrast  Result Date: 02/14/2018 CLINICAL DATA:  Followup stroke. EXAM: CT ANGIOGRAPHY HEAD AND NECK TECHNIQUE: Multidetector CT imaging of the head and neck was performed using the standard protocol during bolus administration of intravenous contrast. Multiplanar CT image reconstructions and MIPs were obtained to evaluate the vascular anatomy. Carotid stenosis measurements (when applicable) are obtained utilizing NASCET criteria, using the distal internal carotid diameter as the denominator. CONTRAST:  28mL ISOVUE-370 IOPAMIDOL (ISOVUE-370) INJECTION 76% COMPARISON:  CT done yesterday.  MRI 12/05/2007. FINDINGS: CTA NECK FINDINGS Aortic arch: Aortic atherosclerosis. Aneurysmal dilatation of the ascending aorta with diameter of 4.4 cm. Branching pattern of the brachiocephalic vessels is  normal without origin stenosis. Right carotid system: Common carotid artery is patent to the bifurcation. Complex calcified and soft plaque at the carotid bifurcation with occlusion of the internal carotid artery. The external carotid is widely patent. Left carotid system: Common carotid artery shows soft plaque with stenosis of 40% throughout its course to the bifurcation. At the carotid bifurcation, there is soft and calcified plaque with occlusion of the internal carotid artery. Vertebral arteries: Right vertebral artery is dominant. Both vertebral artery origins are widely patent.  Both vertebral arteries show scattered atherosclerotic plaque but no flow limiting stenosis. Both vertebral arteries are patent through the cervical region to the foramen magnum. Skeleton: Ordinary mid cervical spondylosis. Other neck: No mass or lymphadenopathy.  Thyroid nodules. Upper chest: Mild pulmonary scarring. Review of the MIP images confirms the above findings CTA HEAD FINDINGS Anterior circulation: No antegrade flow in the internal carotid arteries through the skull base or siphon regions. Reconstituted flow in the supraclinoid internal carotid arteries, probably secondary to external to internal collaterals and patent posterior communicating arteries. The anterior and middle cerebral vessels show flow without correctable proximal stenosis or large missing branch. Distal vessels show atherosclerotic irregularity. Posterior circulation: Both vertebral arteries are patent through the foramen magnum to the basilar. No basilar stenosis. There is some atherosclerotic irregularity of the basilar artery. Both posterior cerebral arteries show flow at their origins, but there is occlusion of the right PCA just beyond its origin. Some collateral flow is present distal to that, but it appears diminished. Venous sinuses: Patent and normal. Anatomic variants: None significant Delayed phase: No abnormal enhancement. 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 Left carotid occlusion is newly seen since the previous study of 2009. Reconstituted flow in the supraclinoid internal carotid arteries due to patent posterior communicating arteries and external to internal collaterals. Atherosclerotic irregularity of the anterior and middle cerebral branches, but with flow present. No vertebral stenosis. Atherosclerotic irregularity of the basilar but without flow limiting stenosis. Occlusion of the right PCA  just beyond its origin, with more distal flow probably deriving from collateral vessels. Electronically Signed   By: Nelson Chimes M.D.   On: 02/14/2018 11:28   Dg Shoulder Right  Result Date: 02/13/2018 CLINICAL DATA:  [place] at home today BILATERAL shoulder pain worse on LEFT than RIGHT EXAM: RIGHT SHOULDER - 2+ VIEW COMPARISON:  None FINDINGS: Osseous mineralization diffusely decreased. AC joint alignment normal. No acute fracture, dislocation, or bone destruction. Visualized RIGHT ribs intact. IMPRESSION: No acute osseous abnormalities. Electronically Signed   By: Lavonia Dana M.D.   On: 02/13/2018 14:50   Ct Head Wo Contrast  Result Date: 02/13/2018 CLINICAL DATA:  Status post fall.  Found on floor.  Weakness. EXAM: CT HEAD WITHOUT CONTRAST CT CERVICAL SPINE WITHOUT CONTRAST TECHNIQUE: Multidetector CT imaging of the head and cervical spine was performed following the standard protocol without intravenous contrast. Multiplanar CT image reconstructions of the cervical spine were also generated. COMPARISON:  4/23/9 FINDINGS: CT HEAD FINDINGS Brain: There is a large asymmetric area of low attenuation involving the right temporal lobe and right occipital lobe which is new from the previous exam. Also new from the previous exam is left frontal lobe cortical and subcortical encephalomalacia compatible with a chronic infarct. There is moderate, progressive diffuse low-attenuation within the subcortical and periventricular white matter compatible with chronic microvascular disease. A chronic appearing lacunar infarct within the  posterior right basal ganglia noted. Moderately progressive atrophy with prominence of the sulci and ventricles noted bilaterally. Vascular: Atherosclerotic calcifications noted. No hyperdense vessel or unexpected calcification noted. Skull: Normal. Negative for fracture or focal lesion. Sinuses/Orbits: No acute finding. Other: None CT CERVICAL SPINE FINDINGS Alignment: Normal. Skull base and  vertebrae: No acute fracture. No primary bone lesion or focal pathologic process. Soft tissues and spinal canal: None Disc levels: Multi level degenerative disc disease noted. Most advanced at C4-5, C5-6 and C6-7. Upper chest: Aortic atherosclerosis noted. Other: None IMPRESSION: 1. There is a moderate to large area of low attenuation involving the right temporal lobe and right occipital lobe compatible with an acute or subacute right PCA infarct. No hemorrhage identified and no significant mass effect. 2. Chronic left frontal lobe infarct with progressive global atrophy and chronic small vessel ischemic change. 3. No evidence for cervical spine fracture or dislocation. 4. Cervical spondylosis. 5. Critical Value/emergent results were called by telephone at the time of interpretation on 02/13/2018 at 12:24 pm to [facility] [place], who verbally acknowledged these results. Electronically Signed   By: Kerby Moors M.D.   On: 02/13/2018 12:24   Ct Angio Neck W Or Wo Contrast  Result Date: 02/14/2018 CLINICAL DATA:  Followup stroke. EXAM: CT ANGIOGRAPHY HEAD AND NECK TECHNIQUE: Multidetector CT imaging of the head and neck was performed using the standard protocol during bolus administration of intravenous contrast. Multiplanar CT image reconstructions and MIPs were obtained to evaluate the vascular anatomy. Carotid stenosis measurements (when applicable) are obtained utilizing NASCET criteria, using the distal internal carotid diameter as the denominator. CONTRAST:  49mL ISOVUE-370 IOPAMIDOL (ISOVUE-370) INJECTION 76% COMPARISON:  CT done yesterday.  MRI 12/05/2007. FINDINGS: CTA NECK FINDINGS Aortic arch: Aortic atherosclerosis. Aneurysmal dilatation of the ascending aorta with diameter of 4.4 cm. Branching pattern of the brachiocephalic vessels is normal without origin stenosis. Right carotid system: Common carotid artery is patent to the bifurcation. Complex calcified and soft plaque at the carotid bifurcation with  occlusion of the internal carotid artery. The external carotid is widely patent. Left carotid system: Common carotid artery shows soft plaque with stenosis of 40% throughout its course to the bifurcation. At the carotid bifurcation, there is soft and calcified plaque with occlusion of the internal carotid artery. Vertebral arteries: Right vertebral artery is dominant. Both vertebral artery origins are widely patent. Both vertebral arteries show scattered atherosclerotic plaque but no flow limiting stenosis. Both vertebral arteries are patent through the cervical region to the foramen magnum. Skeleton: Ordinary mid cervical spondylosis. Other neck: No mass or lymphadenopathy.  Thyroid nodules. Upper chest: Mild pulmonary scarring. Review of the MIP images confirms the above findings CTA HEAD FINDINGS Anterior circulation: No antegrade flow in the internal carotid arteries through the skull base or siphon regions. Reconstituted flow in the supraclinoid internal carotid arteries, probably secondary to external to internal collaterals and patent posterior communicating arteries. The anterior and middle cerebral vessels show flow without correctable proximal stenosis or large missing branch. Distal vessels show atherosclerotic irregularity. Posterior circulation: Both vertebral arteries are patent through the foramen magnum to the basilar. No basilar stenosis. There is some atherosclerotic irregularity of the basilar artery. Both posterior cerebral arteries show flow at their origins, but there is occlusion of the right PCA just beyond its origin. Some collateral flow is present distal to that, but it appears diminished. Venous sinuses: Patent and normal. Anatomic variants: None significant Delayed phase: No abnormal enhancement. Review of the MIP images confirms the  above findings IMPRESSION: Occlusion of both internal carotid arteries at their origins. 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 Left carotid occlusion is newly  seen since the previous study of 2009. Reconstituted flow in the supraclinoid internal carotid arteries due to patent posterior communicating arteries and external to internal collaterals. Atherosclerotic irregularity of the anterior and middle cerebral branches, but with flow present. No vertebral stenosis. Atherosclerotic irregularity of the basilar but without flow limiting stenosis. Occlusion of the right PCA just beyond its origin, with more distal flow probably deriving from collateral vessels. Electronically Signed   By: Nelson Chimes M.D.   On: 02/14/2018 11:28   Ct Angio Chest Pe W And/or Wo Contrast  Result Date: 02/13/2018 CLINICAL DATA:  Chest pain and weakness EXAM: CT ANGIOGRAPHY CHEST WITH CONTRAST TECHNIQUE: Multidetector CT imaging of the chest was performed using the standard protocol during bolus administration of intravenous contrast. Multiplanar CT image reconstructions and MIPs were obtained to evaluate the vascular anatomy. CONTRAST:  167mL ISOVUE-370 IOPAMIDOL (ISOVUE-370) INJECTION 76% COMPARISON:  Chest radiograph August 24, 2010 FINDINGS: Cardiovascular: There is no demonstrable pulmonary embolus. The ascending thoracic aorta has a maximum transverse diameter of 4.3 x 4.3 cm. The measured diameter at the aortic arch is 3.6 cm. No dissection is evident. It must be cautioned that the contrast bolus in the aorta is not sufficient to exclude the possibility of dissection is a differential consideration. There is aortic atherosclerosis. There are a few scattered foci of calcification in visualized great vessels. There are foci of native coronary artery calcification evident. Patient is status post coronary artery bypass grafting. There is no pericardial effusion or pericardial thickening. The main pulmonary outflow tract measures 3.4 cm, enlarged. Mediastinum/Nodes: Visualized thyroid appears normal. There is no appreciable thoracic adenopathy. No esophageal lesions are evident.  Lungs/Pleura: There is mild lower lobe atelectasis bilaterally. There is no lung edema or consolidation. No pleural effusion or pleural thickening evident. Upper Abdomen: In the visualized upper abdomen, there is atherosclerotic calcification in the aorta. There also scattered renal artery calcifications. Visualized upper abdominal structures otherwise appear unremarkable. Musculoskeletal: Patient is status post median sternotomy. No blastic or lytic bone lesions. No chest wall lesions evident. Review of the MIP images confirms the above findings. IMPRESSION: 1.  No demonstrable pulmonary embolus. 2. Prominence of the ascending thoracic aorta measuring 4.3 x 4.3 cm. Prominence at the arch level measuring 3.6 cm in diameter. Recommend annual imaging followup by CTA or MRA. This recommendation follows 2010 ACCF/AHA/AATS/ACR/ASA/SCA/SCAI/SIR/STS/SVM Guidelines for the Diagnosis and Management of Patients with Thoracic Aortic Disease. Circulation.2010; 121: Z610-R604. No dissection evident. It must be cautioned that the contrast bolus in the aorta is insufficient to exclude dissection as a potential differential diagnostic consideration from a radiographic standpoint. There is aortic atherosclerosis. Patient is status post coronary artery bypass grafting. 3. Prominence of the main pulmonary outflow tract measuring 3.4 cm, a finding felt to be indicative of a degree of pulmonary arterial hypertension. 4. No lung edema or consolidation. Areas of lower lobe atelectasis. 5.  No evident thoracic adenopathy. Aortic Atherosclerosis (ICD10-I70.0). Electronically Signed   By: Lowella Grip III M.D.   On: 02/13/2018 13:36   Ct Cervical Spine Wo Contrast  Result Date: 02/13/2018 CLINICAL DATA:  Status post fall.  Found on floor.  Weakness. EXAM: CT HEAD WITHOUT CONTRAST CT CERVICAL SPINE WITHOUT CONTRAST TECHNIQUE: Multidetector CT imaging of the head and cervical spine was performed following the standard protocol without  intravenous contrast. Multiplanar CT image reconstructions of the  cervical spine were also generated. COMPARISON:  4/23/9 FINDINGS: CT HEAD FINDINGS Brain: There is a large asymmetric area of low attenuation involving the right temporal lobe and right occipital lobe which is new from the previous exam. Also new from the previous exam is left frontal lobe cortical and subcortical encephalomalacia compatible with a chronic infarct. There is moderate, progressive diffuse low-attenuation within the subcortical and periventricular white matter compatible with chronic microvascular disease. A chronic appearing lacunar infarct within the posterior right basal ganglia noted. Moderately progressive atrophy with prominence of the sulci and ventricles noted bilaterally. Vascular: Atherosclerotic calcifications noted. No hyperdense vessel or unexpected calcification noted. Skull: Normal. Negative for fracture or focal lesion. Sinuses/Orbits: No acute finding. Other: None CT CERVICAL SPINE FINDINGS Alignment: Normal. Skull base and vertebrae: No acute fracture. No primary bone lesion or focal pathologic process. Soft tissues and spinal canal: None Disc levels: Multi level degenerative disc disease noted. Most advanced at C4-5, C5-6 and C6-7. Upper chest: Aortic atherosclerosis noted. Other: None IMPRESSION: 1. There is a moderate to large area of low attenuation involving the right temporal lobe and right occipital lobe compatible with an acute or subacute right PCA infarct. No hemorrhage identified and no significant mass effect. 2. Chronic left frontal lobe infarct with progressive global atrophy and chronic small vessel ischemic change. 3. No evidence for cervical spine fracture or dislocation. 4. Cervical spondylosis. 5. Critical Value/emergent results were called by telephone at the time of interpretation on 02/13/2018 at 12:24 pm to [facility], who verbally acknowledged these results. Electronically Signed   By: Kerby Moors M.D.   On: 02/13/2018 12:24   Ct Thoracic Spine Wo Contrast  Result Date: 02/13/2018 CLINICAL DATA:  Back pain with minor trauma. Found on floor. Generalized weakness EXAM: CT THORACIC AND LUMBAR SPINE WITHOUT CONTRAST TECHNIQUE: Multidetector CT imaging of the thoracic and lumbar spine was performed without contrast. Multiplanar CT image reconstructions were also generated. COMPARISON:  None. FINDINGS: CT THORACIC SPINE FINDINGS Alignment: No traumatic malalignment. Mild thoracic dextrocurvature which could be positional. Vertebrae: Negative for fracture, endplate erosion, or aggressive bone lesion. Paraspinal and other soft tissues: Intrathoracic findings described on dedicated CT. Small intramuscular lipoma within the right trapezius, right para median. Disc levels: Minor spondylosis.  No visible cord impingement CT LUMBAR SPINE FINDINGS Segmentation: Articulation between the L5 left transverse process and sacrum. Alignment: Normal Vertebrae: Negative for fracture or visible bone lesion. Paraspinal and other soft tissues: No acute finding. Atherosclerosis with 27 mm diameter infrarenal aorta. Colonic diverticulosis. Disc levels: Minor disc bulging.  No impingement IMPRESSION: No acute finding in thoracic or lumbar spine. Electronically Signed   By: Monte Fantasia M.D.   On: 02/13/2018 13:33   Ct Lumbar Spine Wo Contrast  Result Date: 02/13/2018 CLINICAL DATA:  Back pain with minor trauma. Found on floor. Generalized weakness EXAM: CT THORACIC AND LUMBAR SPINE WITHOUT CONTRAST TECHNIQUE: Multidetector CT imaging of the thoracic and lumbar spine was performed without contrast. Multiplanar CT image reconstructions were also generated. COMPARISON:  None. FINDINGS: CT THORACIC SPINE FINDINGS Alignment: No traumatic malalignment. Mild thoracic dextrocurvature which could be positional. Vertebrae: Negative for fracture, endplate erosion, or aggressive bone lesion. Paraspinal and other soft tissues:  Intrathoracic findings described on dedicated CT. Small intramuscular lipoma within the right trapezius, right para median. Disc levels: Minor spondylosis.  No visible cord impingement CT LUMBAR SPINE FINDINGS Segmentation: Articulation between the L5 left transverse process and sacrum. Alignment: Normal Vertebrae: Negative for fracture or visible  bone lesion. Paraspinal and other soft tissues: No acute finding. Atherosclerosis with 27 mm diameter infrarenal aorta. Colonic diverticulosis. Disc levels: Minor disc bulging.  No impingement IMPRESSION: No acute finding in thoracic or lumbar spine. Electronically Signed   By: Monte Fantasia M.D.   On: 02/13/2018 13:33   Dg Shoulder Left  Result Date: 02/13/2018 CLINICAL DATA:  [place] at home today BILATERAL shoulder pain worse on LEFT than RIGHT EXAM: LEFT SHOULDER - 2+ VIEW COMPARISON:  Rif 12/01/2008 FINDINGS: Osseous demineralization. AC joint alignment normal. No acute fracture, dislocation, or bone destruction. Small calcified ossicle lateral to the acromion appears stable. Visualized LEFT ribs intact. Atherosclerotic calcifications aorta and postsurgical changes of CABG noted. IMPRESSION: No acute LEFT shoulder abnormalities. Electronically Signed   By: Lavonia Dana M.D.   On: 02/13/2018 14:49   Carotid Doppler   Unable to visualize flow in bilateral internal carotid arteries by color, power, and pulsed wave Doppler, therefore findings are suggestive of bilateral internal carotid artery occlusion. Left proximal internal carotid artery exhibits echogenic material, with an anechoic lumen distal to this area. This is suggestive of possible thrombus/embolic material. Bilateral external carotid arteries are patent with low-resistant flow. Bilateral vertebral arteries are patent with low-resistant flow.  TTE  - Left ventricle: The cavity size was normal. Wall thickness was   increased in a pattern of moderate LVH. Systolic function was   normal. The estimated  ejection fraction was in the range of 60%   to 65%. There is hypokinesis of the basalinferolateral and   inferior myocardium. Indeterminate diastolic function. - Aortic valve: Mildly calcified annulus. Trileaflet. - Aorta: Ascending aortic diameter: 44 mm (S). - Mitral valve: Mildly thickened leaflets. There was mild   regurgitation. - Left atrium: The atrium was mildly to moderately dilated. - Right atrium: The atrium was mildly dilated. Central venous   pressure (est): 3 mm Hg. - Atrial septum: No defect or patent foramen ovale was identified. - Tricuspid valve: There was mild regurgitation. - Pulmonary arteries: PA peak pressure: 31 mm Hg (S). - Pericardium, extracardiac: There was no pericardial effusion.  TCD pending   PHYSICAL EXAM  Temp:  [97.6 F (36.4 C)-98 F (36.7 C)] 97.8 F (36.6 C) (07/04 0822) Pulse Rate:  [55-138] 123 (07/04 0859) Resp:  [14-19] 15 (07/04 0822) BP: (105-174)/(84-124) 120/93 (07/04 0859) SpO2:  [94 %-100 %] 100 % (07/04 0822) Weight:  [113 lb 8.6 oz (51.5 kg)] 113 lb 8.6 oz (51.5 kg) (07/03 1500)  General - Well nourished, well developed, in mild distress of whole body pain.  Ophthalmologic - fundi not visualized due to noncooperation.  Cardiovascular - Regular rate and rhythm.  Mental Status -  Level of arousal and orientation to time, place, and person were intact. Language including expression, naming, repetition, comprehension was assessed and found intact. Fund of Knowledge was assessed and was intact.  Cranial Nerves II - XII - II - dense left hemianopia. III, IV, VI - Extraocular movements intact. V - Facial sensation decreased on the left, 50% of the right. VII - Facial movement intact bilaterally. VIII - Hearing & vestibular intact bilaterally. X - Palate elevates symmetrically. XI - Chin turning & shoulder shrug intact bilaterally. XII - Tongue protrusion intact.  Motor Strength - The patient's strength was normal in RUE and  RLE, but 4/5 LUE and LLE due to pain and pronator drift was absent.  Bulk was normal and fasciculations were absent.   Motor Tone - Muscle tone was assessed at  the neck and appendages and was normal.  Reflexes - The patient's reflexes were symmetrical in all extremities and she had no pathological reflexes.  Sensory - Light touch, temperature/pinprick were assessed and were decreased on the left, about 50% of the right.    Coordination - The patient had normal movements in both hands with no ataxia or dysmetria, however slow on the left.  Tremor was absent.  Gait and Station - deferred.   ASSESSMENT/PLAN Kimberly [name] is a 62 y.o. female with history of CAD s/p CABG in 2008, stroke in 2009 with right thalamic/IC infarct, chronic right ICA occlusion, HTN, HLD admitted for dizziness, fall and left hemianopia. No tPA given due to OSW.    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 Will start anticoagulation 5-7 days post stroke given the large size of stroke to avoid hemorrhagic conversion  Patient counseled to be compliant with her antithrombotic medications  Ongoing aggressive stroke risk factor management  Therapy recommendations:  pending  Disposition:  Pending  PAF with RVR  New diagnosis  Now back to NSR  Cardiology on board  On metoprolol 25mg  Q6h - developed hypotension and bradycardia - changed to metoprolol bid - continue to have  low BP and HR - metoprolol d/c'ed  Will start anticoagulation 5-7 days post stroke given the large size of stroke to avoid hemorrhagic conversion  History of stroke  11/2007 - right thalamic/IC infarct on MRI, MRA showed right ICA occluded, left ICA siphon severe stenosis, b/l PCA stenosis, R>L.   CT this admission also showed chronic left frontal small infarct  On ASA and plavix at home  Cerebral vascular occlusion/stenosis  CTA head and neck showed bilateral ICA occluded at origin, right PCA occlusion, BA irregularity  CUS b/l ICA occlusion, left ICA occlusion may be acute  Asymptomatic left ICA occlusion - ? Chronic (severe left ICA siphon stenosis 2009)  TCD pending to evaluate collateral flow  Hypertension Stable Permissive hypertension (OK if <220/120) for 24-48 hours post stroke and then gradually decrease to goal within 3-5 days.  BP goal 130-160 given b/l ICA occlusion  D/c metoprolol to avoid hypotension  Hyperlipidemia  Home meds:  crestor   LDL 100, goal < 70  Now on lipitor 80  Continue statin at discharge  Other Stroke Risk Factors  Advanced age  Coronary artery disease s/p  CABG 2008  Other Active Problems  Left shoulder pain after all  B/l hip pain after fall  Hospital day # 1  I spent  35 minutes in total face-to-face time with the patient, more than 50% of which was spent in counseling and coordination of care, reviewing test results, images and medication, and discussing the diagnosis of b/l ICA occlusion, right PCA stroke, low BP, afib RVR treatment plan and potential prognosis. This patient's care requiresreview of multiple databases, neurological assessment, discussion with family, other specialists and medical decision making of high complexity.  I had long discussion with pt and daughter at bedside, updated pt current condition, treatment plan and potential prognosis. They expressed understanding and appreciation.    Rosalin Hawking, MD  PhD Stroke Neurology 02/14/2018 12:07 PM    To contact Stroke Continuity provider, please refer to [email or web address] After hours, contact General Neurology

## 2018-02-14 NOTE — Progress Notes (Signed)
Initial Nutrition Assessment  DOCUMENTATION CODES:   Underweight  INTERVENTION:    Ensure Enlive po BID, each supplement provides 350 kcal and 20 grams of protein  NUTRITION DIAGNOSIS:   Underweight related to (unknown etiology) as evidenced by (BMI=18.3).  GOAL:   Patient will meet greater than or equal to 90% of their needs  MONITOR:   PO intake, Supplement acceptance, Labs, I & O's  REASON FOR ASSESSMENT:   Consult Assessment of nutrition requirement/status  ASSESSMENT:   62 yo female with PMH of CAD, stroke, HLD, HTN who was admitted on 7/3 with subacute right PCA infarct s/p fall at home.  Unable to speak with patient; she is out of her room for a procedure.  Received MD consult for assessment of nutrition status.  Patient is underweight with BMI = 18.3.  Labs reviewed. Potassium 3.3 (L) Medications reviewed and include MVI.  Patient is at risk for malnutrition given underweight status.   NUTRITION - FOCUSED PHYSICAL EXAM:  unable to complete NFPE at this time  Diet Order:   Diet Order           Diet Heart Room service appropriate? Yes; Fluid consistency: Thin  Diet effective ____          EDUCATION NEEDS:   No education needs have been identified at this time  Skin:  Skin Assessment: Reviewed RN Assessment  Last BM:  7/2  Height:   Ht Readings from Last 1 Encounters:  02/14/18 5\' 6"  (1.676 m)    Weight:   Wt Readings from Last 1 Encounters:  02/13/18 113 lb 8.6 oz (51.5 kg)    Ideal Body Weight:  59.1 kg  BMI:  Body mass index is 18.33 kg/m.  Estimated Nutritional Needs:   Kcal:  1600-1800  Protein:  70-85 gm  Fluid:  1.6 L    Molli Barrows, RD, LDN, [place] Pager [phone] After Hours Pager [phone]

## 2018-02-14 NOTE — Progress Notes (Signed)
PT Cancellation Note  Patient Details Name: Kimberly [name] MRN: [id number] DOB: [birth year]   Cancelled Treatment:    Reason Eval/Treat Not Completed: Patient at procedure or test/unavailable   Duncan Dull 02/14/2018, 9:17 AM

## 2018-02-14 NOTE — Progress Notes (Addendum)
OT Cancellation Note  Patient Details Name: Kimberly [name] MRN: [id number] DOB: [birth year]   Cancelled Treatment:    Reason Eval/Treat Not Completed: Patient at procedure or test/ unavailable(Echo)  Merri Ray Sewell Pitner 02/14/2018, 9:44 AM  Hulda Humphrey OTR/L [phone]  Attempted X2 Pt with current bedrest orders.  Hulda Humphrey OTR/L [phone]

## 2018-02-14 NOTE — Progress Notes (Signed)
*  PRELIMINARY RESULTS* Vascular Ultrasound Carotid Duplex (Doppler) has been completed.  Unable to visualize flow in bilateral internal carotid arteries by color, power, and pulsed wave Doppler, therefore findings are suggestive of bilateral internal carotid artery occlusion. Left proximal internal carotid artery exhibits echogenic material, with an anechoic lumen distal to this area. This is suggestive of possible thrombus/embolic material. Bilateral external carotid arteries are patent with low-resistant flow. Bilateral vertebral arteries are patent with low-resistant flow.  Preliminary results discussed with Dr. Sloan Leiter.  02/14/2018 9:43 AM Maudry Mayhew, BS, RVT, RDCS, RDMS

## 2018-02-14 NOTE — Progress Notes (Signed)
PT Cancellation Note  Patient Details Name: Kimberly [name] MRN: [id number] DOB: 1955-09-13   Cancelled Treatment:    Reason Eval/Treat Not Completed: Active bedrest order.    Duncan Dull 02/14/2018, 3:37 PM Alben Deeds, PT DPT  Board Certified Neurologic Specialist [phone]

## 2018-02-15 ENCOUNTER — Inpatient Hospital Stay (HOSPITAL_COMMUNITY): Payer: Medicaid Other

## 2018-02-15 DIAGNOSIS — I422 Other hypertrophic cardiomyopathy: Secondary | ICD-10-CM

## 2018-02-15 DIAGNOSIS — I6350 Cerebral infarction due to unspecified occlusion or stenosis of unspecified cerebral artery: Secondary | ICD-10-CM

## 2018-02-15 DIAGNOSIS — I480 Paroxysmal atrial fibrillation: Secondary | ICD-10-CM

## 2018-02-15 DIAGNOSIS — E440 Moderate protein-calorie malnutrition: Secondary | ICD-10-CM

## 2018-02-15 LAB — BASIC METABOLIC PANEL
ANION GAP: 4 — AB (ref 5–15)
BUN: 17 mg/dL (ref 8–23)
CO2: 23 mmol/L (ref 22–32)
Calcium: 8.4 mg/dL — ABNORMAL LOW (ref 8.9–10.3)
Chloride: 113 mmol/L — ABNORMAL HIGH (ref 98–111)
Creatinine, Ser: 0.94 mg/dL (ref 0.44–1.00)
GFR calc Af Amer: >60 mL/min (ref >60)
GFR calc non Af Amer: >60 mL/min (ref >60)
GLUCOSE: 84 mg/dL (ref 70–99)
POTASSIUM: 3.7 mmol/L (ref 3.5–5.1)
Sodium: 140 mmol/L (ref 135–145)

## 2018-02-15 LAB — ECHOCARDIOGRAM COMPLETE: Weight: 1816.59 oz

## 2018-02-15 MED ORDER — SENNA 8.6 MG PO TABS
2.0000 | ORAL_TABLET | Freq: Every day | ORAL | Status: DC
  Administered 2018-02-15 – 2018-02-17 (×3): 17.2 mg via ORAL
  Filled 2018-02-15 (×3): qty 2

## 2018-02-15 MED ORDER — ACETAMINOPHEN 500 MG PO TABS
1000.0000 mg | ORAL_TABLET | Freq: Three times a day (TID) | ORAL | Status: DC
  Administered 2018-02-15 – 2018-02-18 (×9): 1000 mg via ORAL
  Filled 2018-02-15 (×10): qty 2

## 2018-02-15 MED ORDER — MORPHINE SULFATE (PF) 2 MG/ML IV SOLN
1.0000 mg | Freq: Once | INTRAVENOUS | Status: AC | PRN
  Administered 2018-02-15: 1 mg via INTRAVENOUS
  Filled 2018-02-15: qty 1

## 2018-02-15 MED ORDER — POLYETHYLENE GLYCOL 3350 17 G PO PACK
17.0000 g | PACK | Freq: Every day | ORAL | Status: DC
  Administered 2018-02-15: 17 g via ORAL
  Filled 2018-02-15 (×4): qty 1

## 2018-02-15 MED ORDER — MORPHINE SULFATE (PF) 2 MG/ML IV SOLN
2.0000 mg | Freq: Once | INTRAVENOUS | Status: AC
  Administered 2018-02-15: 2 mg via INTRAVENOUS
  Filled 2018-02-15: qty 1

## 2018-02-15 MED ORDER — CYCLOBENZAPRINE HCL 10 MG PO TABS
5.0000 mg | ORAL_TABLET | Freq: Three times a day (TID) | ORAL | Status: DC | PRN
  Administered 2018-02-15 – 2018-02-16 (×3): 5 mg via ORAL
  Filled 2018-02-15 (×3): qty 1

## 2018-02-15 MED ORDER — ENSURE ENLIVE PO LIQD
237.0000 mL | Freq: Two times a day (BID) | ORAL | Status: DC
  Administered 2018-02-15 – 2018-02-18 (×5): 237 mL via ORAL

## 2018-02-15 MED ORDER — NICOTINE 7 MG/24HR TD PT24
7.0000 mg | MEDICATED_PATCH | Freq: Every day | TRANSDERMAL | Status: DC
  Administered 2018-02-15 – 2018-02-18 (×4): 7 mg via TRANSDERMAL
  Filled 2018-02-15 (×4): qty 1

## 2018-02-15 MED ORDER — BISACODYL 5 MG PO TBEC
5.0000 mg | DELAYED_RELEASE_TABLET | Freq: Every day | ORAL | Status: DC | PRN
  Administered 2018-02-16: 5 mg via ORAL
  Filled 2018-02-15 (×2): qty 1

## 2018-02-15 NOTE — Progress Notes (Signed)
PROGRESS NOTE        PATIENT DETAILS Name: Kimberly [name] Age: 62 y.o. Sex: female Date of Birth: 1955-11-03 Admit Date: 02/13/2018 Admitting Physician Janora Norlander, MD RKY:HCWCB, Doreene Burke  Brief Narrative: Patient is a 62 y.o. female with history of CAD status post CABG, prior CVA, dyslipidemia, gout who presented with dizziness, and subsequently sustained a mechanical fall subsequently she started complaining of back pain.  She was brought to the emergency room, where a CT of the head rated a subacute right PCA infarct.  Patient was then admitted to the hospitalist service for further evaluation and treatment.  See below for further details  Subjective: Complains of pain in her back and in the left shoulder.  Assessment/Plan: Subacute right PCA infarct: Probably embolic given A. fib on admission.CT head on admission showed a right subacute PCA infarct.  RI brain confirmed large PCA territory infarct.  Carotid Doppler showed complete occlusion of bilateral internal carotid artery, as a subsequent CT angiogram of the neck confirmed and of bilateral internal carotid arteries at the origins, however appears to have reconstituted flow in the supraclinoid intracranial arteries from collaterals/posterior communicating arteries.  Showed preserved EF without any embolic source. LDL 100, A1c 5.8.  EKG on admission showed A. fib with RVR-but now maintaining sinus rhythm.  Currently on dual antiplatelets and high intensity statin-neurology recommending to start full dose anticoagulation 5-7 days post stroke given large size of stroke to avoid hemorrhagic conversion.  Awaiting evaluation by rehab services.  She appears to have left hemianopia-she does have pain in her left shoulder area-which limits exam but is able to lift her left upper and left lower extremity.  Will await recommendations from rehab services, and await further recommendations from neurology as well.  A. fib  with RVR: Seems to have reverted back to sinus rhythm-initially was on metoprolol-but due to bradycardia/hypotension this has been discontinued.  See above regarding plans to initiate anticoagulation 5-7 days post CVA.  Cardiology following.    Back pain: Probably secondary to fall-likely musculoskeletal etiology-CT of the cervical, thoracic and lumbar spine negative for acute fractures.  Supportive care for now-ambulate with physical therapy services-if continues then we can consider further work-up.  Shoulder pain left: Secondary to the fall that she had on admission-she does have a few abrasions around the left shoulder-x-rays did not show any fracture.  There is no swelling-we will continue to provide supportive care-if the pain does not improve in the next few days, will need further imaging with a CT scan.  Apical variant hypertrophic cardiomyopathy: EKG changes very suggestive of hypertrophic cardiomyopathy-confirmed on echocardiogram.  Previously on beta-blocker-but stopped due to development of bradycardia.  She has no history of syncope or sudden death in her family.  Will need to slowly reinitiate beta-blocker when she is further out from her CVA-in order to avoid hypotension.  Hypokalemia: Repleted, recheck periodically  Hypertension: Permissive hypertension-all antihypertensives currently on hold.    Dyslipidemia: Previously on Crestor 10 mg-been switched to 80 mg of Lipitor due to CVA.    Gout: No evidence of flare-continue with colchicine.  Ascending aortic aneurysm: Stable for outpatient follow-up with her primary outpatient MD.  DVT Prophylaxis: Prophylactic Lovenox   Code Status: Full code   Family Communication: None at bedside  Disposition Plan: Remain inpatient-we will require several more days of hospitalization-await physical therapy services  to determine appropriate disposition as well.  Antimicrobial agents: Anti-infectives (From admission, onward)   None        Procedures: None  CONSULTS: Neurology Cardiology  Time spent: 25 minutes-Greater than 50% of this time was spent in counseling, explanation of diagnosis, planning of further management, and coordination of care.  MEDICATIONS: Scheduled Meds: . aspirin EC  325 mg Oral Daily  . atorvastatin  80 mg Oral q1800  . clopidogrel  75 mg Oral Daily  . colchicine  0.6 mg Oral BID  . enoxaparin (LOVENOX) injection  40 mg Subcutaneous Q24H  . feeding supplement (ENSURE ENLIVE)  237 mL Oral BID BM  . multivitamin with minerals  1 tablet Oral Daily   Continuous Infusions: . sodium chloride 125 mL/hr at 02/15/18 0908   PRN Meds:.acetaminophen **OR** acetaminophen (TYLENOL) oral liquid 160 mg/5 mL **OR** acetaminophen, oxyCODONE, senna-docusate   PHYSICAL EXAM: Vital signs: Vitals:   02/14/18 1938 02/14/18 2343 02/15/18 0435 02/15/18 0852  BP: 120/82 132/74 130/84   Pulse: (!) 55 (!) 53 (!) 55   Resp: 20 16 17    Temp: 97.9 F (36.6 C) 97.8 F (36.6 C) 97.8 F (36.6 C) 98.9 F (37.2 C)  TempSrc: Oral Axillary Oral Oral  SpO2: 100% 100% 100%   Weight:      Height:       Filed Weights   02/13/18 1500  Weight: 51.5 kg (113 lb 8.6 oz)   Body mass index is 18.33 kg/m.   General appearance:Awake, alert, not in any distress.  Eyes:no scleral icterus. HEENT: Atraumatic and Normocephalic Neck: supple, no JVD. Resp:Good air entry bilaterally,no rales or rhonchi CVS: S1 S2 regular, no murmurs.  GI: Bowel sounds present, Non tender and not distended with no gaurding, rigidity or rebound. Extremities: B/L Lower Ext shows no edema, both legs are warm to touch Neurology: Difficult exam due to pain in the left shoulder-but able to move all 4 extremities, able to lift all 4 extremities off the bed. Psychiatric: Normal judgment and insight. Normal mood. Musculoskeletal:No digital cyanosis Skin:No Rash, warm and dry Wounds:N/A  I have personally reviewed following labs and imaging  studies  LABORATORY DATA: CBC: Recent Labs  Lab 02/13/18 1125 02/14/18 0314  WBC 8.4 7.7  NEUTROABS 5.7  --   HGB 12.4 11.1*  HCT 40.3 35.8*  MCV 70.8* 70.5*  PLT 218 573    Basic Metabolic Panel: Recent Labs  Lab 02/13/18 1125 02/14/18 0314 02/15/18 0405  NA 144 142 140  K 3.5 3.3* 3.7  CL 107 111 113*  CO2 23 24 23   GLUCOSE 97 98 84  BUN 13 15 17   CREATININE 1.10* 0.92 0.94  CALCIUM 9.9 8.9 8.4*    GFR: Estimated Creatinine Clearance: 51.1 mL/min (by C-G formula based on SCr of 0.94 mg/dL).  Liver Function Tests: Recent Labs  Lab 02/13/18 1125  AST 35  ALT 27  ALKPHOS 81  BILITOT 1.0  PROT 6.9  ALBUMIN 4.0   No results for input(s): LIPASE, AMYLASE in the last 168 hours. No results for input(s): AMMONIA in the last 168 hours.  Coagulation Profile: No results for input(s): INR, PROTIME in the last 168 hours.  Cardiac Enzymes: Recent Labs  Lab 02/13/18 1545 02/13/18 2118  TROPONINI 0.08* 0.07*    BNP (last 3 results) No results for input(s): PROBNP in the last 8760 hours.  HbA1C: Recent Labs    02/14/18 0314  HGBA1C 5.8*    CBG: No results for input(s): GLUCAP in  the last 168 hours.  Lipid Profile: Recent Labs    02/14/18 0314  CHOL 160  HDL 44  LDLCALC 100*  TRIG 78  CHOLHDL 3.6    Thyroid Function Tests: Recent Labs    02/13/18 1125  TSH 1.245    Anemia Panel: No results for input(s): VITAMINB12, FOLATE, FERRITIN, TIBC, IRON, RETICCTPCT in the last 72 hours.  Urine analysis:    Component Value Date/Time   COLORURINE YELLOW 02/13/2018 1209   APPEARANCEUR CLEAR 02/13/2018 1209   LABSPEC 1.014 02/13/2018 1209   PHURINE 5.0 02/13/2018 1209   GLUCOSEU NEGATIVE 02/13/2018 1209   HGBUR SMALL (A) 02/13/2018 1209   BILIRUBINUR NEGATIVE 02/13/2018 1209   KETONESUR 5 (A) 02/13/2018 1209   PROTEINUR 100 (A) 02/13/2018 1209   UROBILINOGEN 0.2 05/26/2011 2243   NITRITE NEGATIVE 02/13/2018 1209   LEUKOCYTESUR NEGATIVE  02/13/2018 1209    Sepsis Labs: Lactic Acid, Venous No results found for: LATICACIDVEN  MICROBIOLOGY: No results found for this or any previous visit (from the past 240 hour(s)).  RADIOLOGY STUDIES/RESULTS: Ct Angio Head W Or Wo Contrast  Result Date: 02/14/2018 CLINICAL DATA:  Followup stroke. EXAM: CT ANGIOGRAPHY HEAD AND NECK TECHNIQUE: Multidetector CT imaging of the head and neck was performed using the standard protocol during bolus administration of intravenous contrast. Multiplanar CT image reconstructions and MIPs were obtained to evaluate the vascular anatomy. Carotid stenosis measurements (when applicable) are obtained utilizing NASCET criteria, using the distal internal carotid diameter as the denominator. CONTRAST:  79mL ISOVUE-370 IOPAMIDOL (ISOVUE-370) INJECTION 76% COMPARISON:  CT done yesterday.  MRI 12/05/2007. FINDINGS: CTA NECK FINDINGS Aortic arch: Aortic atherosclerosis. Aneurysmal dilatation of the ascending aorta with diameter of 4.4 cm. Branching pattern of the brachiocephalic vessels is normal without origin stenosis. Right carotid system: Common carotid artery is patent to the bifurcation. Complex calcified and soft plaque at the carotid bifurcation with occlusion of the internal carotid artery. The external carotid is widely patent. Left carotid system: Common carotid artery shows soft plaque with stenosis of 40% throughout its course to the bifurcation. At the carotid bifurcation, there is soft and calcified plaque with occlusion of the internal carotid artery. Vertebral arteries: Right vertebral artery is dominant. Both vertebral artery origins are widely patent. Both vertebral arteries show scattered atherosclerotic plaque but no flow limiting stenosis. Both vertebral arteries are patent through the cervical region to the foramen magnum. Skeleton: Ordinary mid cervical spondylosis. Other neck: No mass or lymphadenopathy.  Thyroid nodules. Upper chest: Mild pulmonary  scarring. Review of the MIP images confirms the above findings CTA HEAD FINDINGS Anterior circulation: No antegrade flow in the internal carotid arteries through the skull base or siphon regions. Reconstituted flow in the supraclinoid internal carotid arteries, probably secondary to external to internal collaterals and patent posterior communicating arteries. The anterior and middle cerebral vessels show flow without correctable proximal stenosis or large missing branch. Distal vessels show atherosclerotic irregularity. Posterior circulation: Both vertebral arteries are patent through the foramen magnum to the basilar. No basilar stenosis. There is some atherosclerotic irregularity of the basilar artery. Both posterior cerebral arteries show flow at their origins, but there is occlusion of the right PCA just beyond its origin. Some collateral flow is present distal to that, but it appears diminished. Venous sinuses: Patent and normal. Anatomic variants: None significant Delayed phase: No abnormal enhancement. 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 Left carotid  occlusion is newly seen since the previous study of 2009. Reconstituted flow in the supraclinoid internal carotid arteries due to patent posterior communicating arteries and external to internal collaterals. Atherosclerotic irregularity of the anterior and middle cerebral branches, but with flow present. No vertebral stenosis. Atherosclerotic irregularity of the basilar but without flow limiting stenosis. Occlusion of the right PCA just beyond its origin, with more distal flow probably deriving from collateral vessels. Electronically Signed   By: Nelson Chimes M.D.   On: 02/14/2018 11:28   Dg Shoulder Right  Result Date: 02/13/2018 CLINICAL DATA:  [place] at home today BILATERAL shoulder pain worse on LEFT than RIGHT EXAM: RIGHT SHOULDER -  2+ VIEW COMPARISON:  None FINDINGS: Osseous mineralization diffusely decreased. AC joint alignment normal. No acute fracture, dislocation, or bone destruction. Visualized RIGHT ribs intact. IMPRESSION: No acute osseous abnormalities. Electronically Signed   By: Lavonia Dana M.D.   On: 02/13/2018 14:50   Ct Head Wo Contrast  Result Date: 02/13/2018 CLINICAL DATA:  Status post fall.  Found on floor.  Weakness. EXAM: CT HEAD WITHOUT CONTRAST CT CERVICAL SPINE WITHOUT CONTRAST TECHNIQUE: Multidetector CT imaging of the head and cervical spine was performed following the standard protocol without intravenous contrast. Multiplanar CT image reconstructions of the cervical spine were also generated. COMPARISON:  4/23/9 FINDINGS: CT HEAD FINDINGS Brain: There is a large asymmetric area of low attenuation involving the right temporal lobe and right occipital lobe which is new from the previous exam. Also new from the previous exam is left frontal lobe cortical and subcortical encephalomalacia compatible with a chronic infarct. There is moderate, progressive diffuse low-attenuation within the subcortical and periventricular white matter compatible with chronic microvascular disease. A chronic appearing lacunar infarct within the posterior right basal ganglia noted. Moderately progressive atrophy with prominence of the sulci and ventricles noted bilaterally. Vascular: Atherosclerotic calcifications noted. No hyperdense vessel or unexpected calcification noted. Skull: Normal. Negative for fracture or focal lesion. Sinuses/Orbits: No acute finding. Other: None CT CERVICAL SPINE FINDINGS Alignment: Normal. Skull base and vertebrae: No acute fracture. No primary bone lesion or focal pathologic process. Soft tissues and spinal canal: None Disc levels: Multi level degenerative disc disease noted. Most advanced at C4-5, C5-6 and C6-7. Upper chest: Aortic atherosclerosis noted. Other: None IMPRESSION: 1. There is a moderate to large  area of low attenuation involving the right temporal lobe and right occipital lobe compatible with an acute or subacute right PCA infarct. No hemorrhage identified and no significant mass effect. 2. Chronic left frontal lobe infarct with progressive global atrophy and chronic small vessel ischemic change. 3. No evidence for cervical spine fracture or dislocation. 4. Cervical spondylosis. 5. Critical Value/emergent results were called by telephone at the time of interpretation on 02/13/2018 at 12:24 pm to [facility] [place], who verbally acknowledged these results. Electronically Signed   By: Kerby Moors M.D.   On: 02/13/2018 12:24   Ct Angio Neck W Or Wo Contrast  Result Date: 02/14/2018 CLINICAL DATA:  Followup stroke. EXAM: CT ANGIOGRAPHY HEAD AND NECK TECHNIQUE: Multidetector CT imaging of the head and neck was performed using the standard protocol during bolus administration of intravenous contrast. Multiplanar CT image reconstructions and MIPs were obtained to evaluate the vascular anatomy. Carotid stenosis measurements (when applicable) are obtained utilizing NASCET criteria, using the distal internal carotid diameter as the denominator. CONTRAST:  51mL ISOVUE-370 IOPAMIDOL (ISOVUE-370) INJECTION 76% COMPARISON:  CT done yesterday.  MRI 12/05/2007. FINDINGS: CTA NECK FINDINGS Aortic arch: Aortic atherosclerosis. Aneurysmal  dilatation of the ascending aorta with diameter of 4.4 cm. Branching pattern of the brachiocephalic vessels is normal without origin stenosis. Right carotid system: Common carotid artery is patent to the bifurcation. Complex calcified and soft plaque at the carotid bifurcation with occlusion of the internal carotid artery. The external carotid is widely patent. Left carotid system: Common carotid artery shows soft plaque with stenosis of 40% throughout its course to the bifurcation. At the carotid bifurcation, there is soft and calcified plaque with occlusion of the internal carotid artery.  Vertebral arteries: Right vertebral artery is dominant. Both vertebral artery origins are widely patent. Both vertebral arteries show scattered atherosclerotic plaque but no flow limiting stenosis. Both vertebral arteries are patent through the cervical region to the foramen magnum. Skeleton: Ordinary mid cervical spondylosis. Other neck: No mass or lymphadenopathy.  Thyroid nodules. Upper chest: Mild pulmonary scarring. Review of the MIP images confirms the above findings CTA HEAD FINDINGS Anterior circulation: No antegrade flow in the internal carotid arteries through the skull base or siphon regions. Reconstituted flow in the supraclinoid internal carotid arteries, probably secondary to external to internal collaterals and patent posterior communicating arteries. The anterior and middle cerebral vessels show flow without correctable proximal stenosis or large missing branch. Distal vessels show atherosclerotic irregularity. Posterior circulation: Both vertebral arteries are patent through the foramen magnum to the basilar. No basilar stenosis. There is some atherosclerotic irregularity of the basilar artery. Both posterior cerebral arteries show flow at their origins, but there is occlusion of the right PCA just beyond its origin. Some collateral flow is present distal to that, but it appears diminished. Venous sinuses: Patent and normal. Anatomic variants: None significant Delayed phase: No abnormal enhancement. 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 Left carotid occlusion is newly seen since the previous study of 2009. Reconstituted flow in the supraclinoid internal carotid arteries due to patent posterior communicating arteries and external to internal collaterals. Atherosclerotic irregularity of the anterior and middle cerebral branches, but with flow present. No vertebral stenosis.  Atherosclerotic irregularity of the basilar but without flow limiting stenosis. Occlusion of the right PCA just beyond its origin, with more distal flow probably deriving from collateral vessels. Electronically Signed   By: Nelson Chimes M.D.   On: 02/14/2018 11:28   Ct Angio Chest Pe W And/or Wo Contrast  Result Date: 02/13/2018 CLINICAL DATA:  Chest pain and weakness EXAM: CT ANGIOGRAPHY CHEST WITH CONTRAST TECHNIQUE: Multidetector CT imaging of the chest was performed using the standard protocol during bolus administration of intravenous contrast. Multiplanar CT image reconstructions and MIPs were obtained to evaluate the vascular anatomy. CONTRAST:  162mL ISOVUE-370 IOPAMIDOL (ISOVUE-370) INJECTION 76% COMPARISON:  Chest radiograph August 24, 2010 FINDINGS: Cardiovascular: There is no demonstrable pulmonary embolus. The ascending thoracic aorta has a maximum transverse diameter of 4.3 x 4.3 cm. The measured diameter at the aortic arch is 3.6 cm. No dissection is evident. It must be cautioned that the contrast bolus in the aorta is not sufficient to exclude the possibility of dissection is a differential consideration. There is aortic atherosclerosis. There are a few scattered foci of calcification in visualized great vessels. There are foci of native coronary artery calcification evident. Patient is status post coronary artery bypass grafting. There is no pericardial effusion or pericardial thickening. The main pulmonary outflow tract measures 3.4 cm, enlarged. Mediastinum/Nodes: Visualized thyroid appears normal. There is no appreciable thoracic  adenopathy. No esophageal lesions are evident. Lungs/Pleura: There is mild lower lobe atelectasis bilaterally. There is no lung edema or consolidation. No pleural effusion or pleural thickening evident. Upper Abdomen: In the visualized upper abdomen, there is atherosclerotic calcification in the aorta. There also scattered renal artery calcifications. Visualized  upper abdominal structures otherwise appear unremarkable. Musculoskeletal: Patient is status post median sternotomy. No blastic or lytic bone lesions. No chest wall lesions evident. Review of the MIP images confirms the above findings. IMPRESSION: 1.  No demonstrable pulmonary embolus. 2. Prominence of the ascending thoracic aorta measuring 4.3 x 4.3 cm. Prominence at the arch level measuring 3.6 cm in diameter. Recommend annual imaging followup by CTA or MRA. This recommendation follows 2010 ACCF/AHA/AATS/ACR/ASA/SCA/SCAI/SIR/STS/SVM Guidelines for the Diagnosis and Management of Patients with Thoracic Aortic Disease. Circulation.2010; 121: M578-I696. No dissection evident. It must be cautioned that the contrast bolus in the aorta is insufficient to exclude dissection as a potential differential diagnostic consideration from a radiographic standpoint. There is aortic atherosclerosis. Patient is status post coronary artery bypass grafting. 3. Prominence of the main pulmonary outflow tract measuring 3.4 cm, a finding felt to be indicative of a degree of pulmonary arterial hypertension. 4. No lung edema or consolidation. Areas of lower lobe atelectasis. 5.  No evident thoracic adenopathy. Aortic Atherosclerosis (ICD10-I70.0). Electronically Signed   By: Lowella Grip III M.D.   On: 02/13/2018 13:36   Ct Cervical Spine Wo Contrast  Result Date: 02/13/2018 CLINICAL DATA:  Status post fall.  Found on floor.  Weakness. EXAM: CT HEAD WITHOUT CONTRAST CT CERVICAL SPINE WITHOUT CONTRAST TECHNIQUE: Multidetector CT imaging of the head and cervical spine was performed following the standard protocol without intravenous contrast. Multiplanar CT image reconstructions of the cervical spine were also generated. COMPARISON:  4/23/9 FINDINGS: CT HEAD FINDINGS Brain: There is a large asymmetric area of low attenuation involving the right temporal lobe and right occipital lobe which is new from the previous exam. Also new from  the previous exam is left frontal lobe cortical and subcortical encephalomalacia compatible with a chronic infarct. There is moderate, progressive diffuse low-attenuation within the subcortical and periventricular white matter compatible with chronic microvascular disease. A chronic appearing lacunar infarct within the posterior right basal ganglia noted. Moderately progressive atrophy with prominence of the sulci and ventricles noted bilaterally. Vascular: Atherosclerotic calcifications noted. No hyperdense vessel or unexpected calcification noted. Skull: Normal. Negative for fracture or focal lesion. Sinuses/Orbits: No acute finding. Other: None CT CERVICAL SPINE FINDINGS Alignment: Normal. Skull base and vertebrae: No acute fracture. No primary bone lesion or focal pathologic process. Soft tissues and spinal canal: None Disc levels: Multi level degenerative disc disease noted. Most advanced at C4-5, C5-6 and C6-7. Upper chest: Aortic atherosclerosis noted. Other: None IMPRESSION: 1. There is a moderate to large area of low attenuation involving the right temporal lobe and right occipital lobe compatible with an acute or subacute right PCA infarct. No hemorrhage identified and no significant mass effect. 2. Chronic left frontal lobe infarct with progressive global atrophy and chronic small vessel ischemic change. 3. No evidence for cervical spine fracture or dislocation. 4. Cervical spondylosis. 5. Critical Value/emergent results were called by telephone at the time of interpretation on 02/13/2018 at 12:24 pm to [facility] [place], who verbally acknowledged these results. Electronically Signed   By: Kerby Moors M.D.   On: 02/13/2018 12:24   Ct Thoracic Spine Wo Contrast  Result Date: 02/13/2018 CLINICAL DATA:  Back pain with minor trauma. Found on  floor. Generalized weakness EXAM: CT THORACIC AND LUMBAR SPINE WITHOUT CONTRAST TECHNIQUE: Multidetector CT imaging of the thoracic and lumbar spine was performed without  contrast. Multiplanar CT image reconstructions were also generated. COMPARISON:  None. FINDINGS: CT THORACIC SPINE FINDINGS Alignment: No traumatic malalignment. Mild thoracic dextrocurvature which could be positional. Vertebrae: Negative for fracture, endplate erosion, or aggressive bone lesion. Paraspinal and other soft tissues: Intrathoracic findings described on dedicated CT. Small intramuscular lipoma within the right trapezius, right para median. Disc levels: Minor spondylosis.  No visible cord impingement CT LUMBAR SPINE FINDINGS Segmentation: Articulation between the L5 left transverse process and sacrum. Alignment: Normal Vertebrae: Negative for fracture or visible bone lesion. Paraspinal and other soft tissues: No acute finding. Atherosclerosis with 27 mm diameter infrarenal aorta. Colonic diverticulosis. Disc levels: Minor disc bulging.  No impingement IMPRESSION: No acute finding in thoracic or lumbar spine. Electronically Signed   By: Monte Fantasia M.D.   On: 02/13/2018 13:33   Ct Lumbar Spine Wo Contrast  Result Date: 02/13/2018 CLINICAL DATA:  Back pain with minor trauma. Found on floor. Generalized weakness EXAM: CT THORACIC AND LUMBAR SPINE WITHOUT CONTRAST TECHNIQUE: Multidetector CT imaging of the thoracic and lumbar spine was performed without contrast. Multiplanar CT image reconstructions were also generated. COMPARISON:  None. FINDINGS: CT THORACIC SPINE FINDINGS Alignment: No traumatic malalignment. Mild thoracic dextrocurvature which could be positional. Vertebrae: Negative for fracture, endplate erosion, or aggressive bone lesion. Paraspinal and other soft tissues: Intrathoracic findings described on dedicated CT. Small intramuscular lipoma within the right trapezius, right para median. Disc levels: Minor spondylosis.  No visible cord impingement CT LUMBAR SPINE FINDINGS Segmentation: Articulation between the L5 left transverse process and sacrum. Alignment: Normal Vertebrae: Negative  for fracture or visible bone lesion. Paraspinal and other soft tissues: No acute finding. Atherosclerosis with 27 mm diameter infrarenal aorta. Colonic diverticulosis. Disc levels: Minor disc bulging.  No impingement IMPRESSION: No acute finding in thoracic or lumbar spine. Electronically Signed   By: Monte Fantasia M.D.   On: 02/13/2018 13:33   Mr Brain Wo Contrast  Result Date: 02/14/2018 CLINICAL DATA:  62 year old female found down yesterday with acute to subacute appearing right PCA territory infarct on noncontrast head CT. CTA head and neck today reveals right PCA P1 occlusion, and also bilateral ICA origin occlusion with reconstituted flow at the carotid termini due to circle-of-Willis collaterals. EXAM: MRI HEAD WITHOUT CONTRAST TECHNIQUE: Multiplanar, multiecho pulse sequences of the brain and surrounding structures were obtained without intravenous contrast. COMPARISON:  CTA head and neck 1046 hours today. Head CT 02/13/2018. Brain MRI 12/05/2007. FINDINGS: Study is intermittently degraded by motion artifact despite repeated imaging attempts. Brain: Confluent restricted diffusion throughout the right PCA territory encompassing a 9-10 centimeter area of the right occipital and temporal lobes (series 5, image 42). Scattered small foci of restricted diffusion also in the right thalamus and junction with the right midbrain. Some of the posterior limb right internal capsule is likely affected. There is a superimposed chronic lacune of the lateral right thalamus. Cytotoxic edema throughout the affected territory with petechial hemorrhage in the right occipital pole. Mild regional mass effect appears stable since yesterday. No other restricted diffusion. Chronic posterior MCA territory encephalomalacia in the posterosuperior frontal and superior parietal lobes. Some chronic hemosiderin is associated. Additional chronic anterior left MCA territory cortical encephalomalacia, also affecting the anterior left  corona radiata. No midline shift, mass effect, evidence of mass lesion, ventriculomegaly, extra-axial collection. Cervicomedullary junction and pituitary are within normal limits. Vascular:  Proximal posterior circulation vascular flow voids are preserved. Loss of the right ICA flow void is chronic, but loss of the left ICA flow void in the neck and much of the siphon is new since the 2009 MRI. Evidence of reconstituted bilateral ICA termini flow. Skull and upper cervical spine: Mild cervical spine degeneration. Visualized bone marrow signal is within normal limits. Sinuses/Orbits: Negative. Other: Mastoid air cells are clear. Scalp and face soft tissues appear negative. IMPRESSION: 1. Large acute right PCA territory infarct concordant with the right PCA occlusion demonstrated by CTA. Cytotoxic edema and petechial hemorrhage. No malignant hemorrhagic transformation or significant mass effect. 2. No other acute intracranial abnormality. 3. 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 Electronically Signed   By: Genevie Ann M.D.   On: 02/14/2018 19:25   Dg Shoulder Left  Result Date: 02/13/2018 CLINICAL DATA:  [address] at home today BILATERAL shoulder pain worse on LEFT than RIGHT EXAM: LEFT SHOULDER - 2+ VIEW COMPARISON:  Rif 12/01/2008 FINDINGS: Osseous demineralization. AC joint alignment normal. No acute fracture, dislocation, or bone destruction. Small calcified ossicle lateral to the acromion appears stable. Visualized LEFT ribs intact. Atherosclerotic calcifications aorta and postsurgical changes of CABG noted. IMPRESSION: No acute LEFT shoulder abnormalities. Electronically Signed   By: Lavonia Dana M.D.   On: 02/13/2018 14:49     LOS: 2 days   Oren Binet, MD  [facility]  If 7PM-7AM, please contact night-coverage  Please page via [email or web address]-click on MD name and type text  message  02/15/2018, 10:59 AM

## 2018-02-15 NOTE — Progress Notes (Signed)
Nutrition Follow-up  DOCUMENTATION CODES:   Underweight, Non-severe (moderate) malnutrition in context of chronic illness  INTERVENTION:   - Continue Ensure Enlive po BID, each supplement provides 350 kcal and 20 grams of protein (please provide chocolate or strawberry flavors only)  - Encourage PO intake  NUTRITION DIAGNOSIS:   Underweight related to (unknown etiology) as evidenced by (BMI=18.3).  Ongoing, being addressed with oral nutrition supplements  New Diagnosis upon follow-up and completion of NFPE:  Moderate malnutrition related to chronic illness (CAD, history of stroke) as evidenced by mild subcutaneous fat depletion, moderate subcutaneous fat depletion, mild muscle depletion, moderate muscle depletion.  GOAL:   Patient will meet greater than or equal to 90% of their needs  Progressing  MONITOR:   PO intake, Supplement acceptance, Labs, I & O's  REASON FOR ASSESSMENT:   Consult Assessment of nutrition requirement/status  ASSESSMENT:   62 yo female with PMH of CAD, stroke, HLD, HTN who was admitted on 7/3 with subacute right PCA infarct s/p fall at home.  Spoke with pt at bedside who reports she has not had an appetite since she fell yesterday. Pt reports that prior to her fall, she "eats pretty good" and has a good appetite. Pt reports eating "a lot of fruit" including pears, apples, and kiwi. Pt states that she tries everyday to have 3 meals.  Pt endorses weight loss and has noticed her clothes fitting more loosely. Pt states that her UBW is 135-140 lbs but that she weighed 132 lbs at her doctor's appointment a few weeks ago. RD obtained bed weight at time of visit: 120.6 lbs. Weight of 113 lbs recorded on admission on 7/3. Weight history in chart is limited as last measured weight is from 2017. Unsure of accuracy of weight upon admission.  Pt is agreeable to receiving Ensure Enlive during admission. She states that she is trying to "get the Ensure program"  outpatient. Pt prefers chocolate and strawberry flavors.  Spoke with NT who reports feeding pt breakfast this morning. NT does not believe that pt needs feeding assistance at baseline as pt has reported to NT that she cooks for herself and feeds herself at home.  Meal Completion: 15-30%  Medications reviewed and include: Ensure Enlive BID, MVI with minerals daily  Labs reviewed: chloride 113 (H), anion gap 4 (L), LDL 100 (H), hemoglobin 11.1 (L), HCT 35.8 (L), hemoglobin A1C 5.8 (H)  NUTRITION - FOCUSED PHYSICAL EXAM:    Most Recent Value  Orbital Region  Mild depletion  Upper Arm Region  Moderate depletion  Thoracic and Lumbar Region  Mild depletion  Buccal Region  Mild depletion  Temple Region  Mild depletion  Clavicle Bone Region  Mild depletion  Clavicle and Acromion Bone Region  Moderate depletion  Scapular Bone Region  Unable to assess  Dorsal Hand  Mild depletion  Patellar Region  Moderate depletion  Anterior Thigh Region  Moderate depletion  Posterior Calf Region  Moderate depletion  Edema (RD Assessment)  None  Hair  Reviewed  Eyes  Reviewed  Mouth  Reviewed  Skin  Reviewed  Nails  Unable to assess       Diet Order:   Diet Order           Diet Heart Room service appropriate? Yes; Fluid consistency: Thin  Diet effective ____          EDUCATION NEEDS:   No education needs have been identified at this time  Skin:  Skin Assessment: Reviewed RN Assessment  Last BM:  7/2  Height:   Ht Readings from Last 1 Encounters:  02/14/18 5\' 6"  (1.676 m)    Weight:   Wt Readings from Last 1 Encounters:  02/13/18 113 lb 8.6 oz (51.5 kg)    Ideal Body Weight:  59.1 kg  BMI:  Body mass index is 18.33 kg/m.  Estimated Nutritional Needs:   Kcal:  1600-1800 kcal/day  Protein:  70-85 gm/day  Fluid:  1.6 L/day    Gaynell Face, MS, RD, LDN Pager: [phone] Weekend/After Hours: [phone]

## 2018-02-15 NOTE — Evaluation (Signed)
Physical Therapy Evaluation Patient Details Name: Kimberly [name] MRN: [id number] DOB: 1956/03/31 Today's Date: 02/15/2018   History of Present Illness  Kimberly [name] is a 62 y.o. 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 Has L sided deficits at baseline since her prior CVA. She was found on the floor by her daughter, and is now complaining of severe back pain. CT scan showed moderate to large area of low attenuation involving the right temporal lobe and right occipital lobe compatible with an acute or subacute right PCA infarct. CT clear for R & L shoulder cervical, lumbar, and thoracic spine.   Clinical Impression  Kimberly [name] admitted with the above listed diagnosis. Prior to admission, patient lived alone and was Mod I with SPC for mobility. Patient today requiring Mod A +2 for bed mobility and transfer with assist needed to power up and for LE management during transfer. PT recommending SNF at this time due to reduced independence and safety with all functional mobility with high risk for falls. PT to continue to follow acutely to maximize strength, balance, safety, functional mobility.     Follow Up Recommendations SNF;Supervision/Assistance - 24 hour    Equipment Recommendations  Other (comment)(TBD at next venue of care)    Recommendations for Other Services       Precautions / Restrictions Precautions Precautions: Fall Restrictions Weight Bearing Restrictions: No      Mobility  Bed Mobility Overal bed mobility: Needs Assistance Bed Mobility: Supine to Sit     Supine to sit: Mod assist;+2 for physical assistance     General bed mobility comments: able to initiate bringing LE towards EOB but does require verbal and tactile cueing  Transfers Overall transfer level: Needs assistance Equipment used: 2 person hand held assist Transfers: Sit to/from Stand;Stand Pivot  Transfers Sit to Stand: Mod assist;+2 physical assistance Stand pivot transfers: Mod assist;+2 physical assistance       General transfer comment: difficulty with sequencing LE during transfers requiring knee blocking and physical assist to move LE for safe and efficient transfer  Ambulation/Gait             General Gait Details: deferred  Stairs            Wheelchair Mobility    Modified Rankin (Stroke Patients Only) Modified Rankin (Stroke Patients Only) Pre-Morbid Rankin Score: No symptoms Modified Rankin: Severe disability     Balance Overall balance assessment: Needs assistance Sitting-balance support: Bilateral upper extremity supported;Feet supported Sitting balance-Leahy Scale: Fair                                       Pertinent Vitals/Pain Pain Assessment: 0-10 Pain Score: 8  Pain Location: L shoulder, back and head Pain Descriptors / Indicators: Grimacing;Moaning Pain Intervention(s): Limited activity within patient's tolerance;Monitored during session;Repositioned    Home Living Family/patient expects to be discharged to:: Private residence Living Arrangements: Alone   Type of Home: Apartment Home Access: Stairs to enter Entrance Stairs-Rails: None Entrance Stairs-Number of Steps: 2 Home Layout: One level Home Equipment: Cane - single point      Prior Function Level of Independence: Independent with assistive device(s)         Comments: SPC for all mobility; likes to read     Hand Dominance   Dominant Hand: Right  Extremity/Trunk Assessment   Upper Extremity Assessment Upper Extremity Assessment: Defer to OT evaluation    Lower Extremity Assessment Lower Extremity Assessment: Generalized weakness       Communication   Communication: No difficulties  Cognition Arousal/Alertness: Awake/alert Behavior During Therapy: WFL for tasks assessed/performed Overall Cognitive Status: Impaired/Different from  baseline Area of Impairment: Attention;Following commands;Safety/judgement;Problem solving                   Current Attention Level: Selective   Following Commands: Follows one step commands with increased time Safety/Judgement: Decreased awareness of safety;Decreased awareness of deficits   Problem Solving: Slow processing;Decreased initiation;Difficulty sequencing;Requires verbal cues;Requires tactile cues        General Comments General comments (skin integrity, edema, etc.): BP at 176/96 after transfer    Exercises     Assessment/Plan    PT Assessment Patient needs continued PT services  PT Problem List Decreased strength;Decreased range of motion;Decreased activity tolerance;Decreased balance;Decreased mobility;Decreased coordination;Decreased cognition;Decreased knowledge of use of DME;Decreased safety awareness;Decreased knowledge of precautions       PT Treatment Interventions DME instruction;Gait training;Functional mobility training;Therapeutic activities;Therapeutic exercise;Balance training;Neuromuscular re-education;Cognitive remediation;Patient/family education;Stair training    PT Goals (Current goals can be found in the Care Plan section)  Acute Rehab PT Goals Patient Stated Goal: "I want you to help my pain" PT Goal Formulation: With patient Time For Goal Achievement: 03/01/18 Potential to Achieve Goals: Fair    Frequency Min 3X/week   Barriers to discharge        Co-evaluation PT/OT/SLP Co-Evaluation/Treatment: Yes Reason for Co-Treatment: Complexity of the patient's impairments (multi-system involvement);Necessary to address cognition/behavior during functional activity;For patient/therapist safety PT goals addressed during session: Mobility/safety with mobility         AM-PAC PT "6 Clicks" Daily Activity  Outcome Measure Difficulty turning over in bed (including adjusting bedclothes, sheets and blankets)?: Unable Difficulty moving from  lying on back to sitting on the side of the bed? : Unable Difficulty sitting down on and standing up from a chair with arms (e.g., wheelchair, bedside commode, etc,.)?: Unable Help needed moving to and from a bed to chair (including a wheelchair)?: A Lot Help needed walking in hospital room?: A Lot Help needed climbing 3-5 steps with a railing? : Total 6 Click Score: 8    End of Session Equipment Utilized During Treatment: Gait belt Activity Tolerance: Patient tolerated treatment well Patient left: in chair;with call bell/phone within reach;with chair alarm set;with nursing/sitter in room Nurse Communication: Mobility status PT Visit Diagnosis: Unsteadiness on feet (R26.81);Other abnormalities of gait and mobility (R26.89);Muscle weakness (generalized) (M62.81)    Time: 5449-2010 PT Time Calculation (min) (ACUTE ONLY): 25 min   Charges:   PT Evaluation $PT Eval Moderate Complexity: 1 Mod     PT G Codes:        Lanney Gins, PT, DPT 02/15/18 9:14 AM

## 2018-02-15 NOTE — Progress Notes (Signed)
*  PRELIMINARY RESULTS* Vascular Ultrasound Transcranial Doppler has been completed.    02/15/2018 10:39 AM Maudry Mayhew, BS, RVT, RDCS, RDMS

## 2018-02-15 NOTE — Progress Notes (Signed)
Pt continues to scream: "I'm hurting, my back, my shoulder" oxycodone was last administered @ 0055, Tylenol @ 0247, She has been repositioned several times, yet she seems to get no relief. [facility] on called notified

## 2018-02-15 NOTE — Plan of Care (Signed)
Patient stable, discussed POC with patient, denies question/concerns at this time.  

## 2018-02-15 NOTE — Progress Notes (Signed)
Progress Note  Patient Name: Kimberly [name] Date of Encounter: 02/15/2018  Primary Cardiologist: [name], MD   Subjective   When I entered the room she was asking for pain medication.  After talking with her, she seems to be more calm.  No chest pain, no shortness of breath.  Heart is regular.  Inpatient Medications    Scheduled Meds: . aspirin EC  325 mg Oral Daily  . atorvastatin  80 mg Oral q1800  . clopidogrel  75 mg Oral Daily  . colchicine  0.6 mg Oral BID  . enoxaparin (LOVENOX) injection  40 mg Subcutaneous Q24H  . feeding supplement (ENSURE ENLIVE)  237 mL Oral BID BM  . multivitamin with minerals  1 tablet Oral Daily   Continuous Infusions: . sodium chloride 125 mL/hr at 02/15/18 0908   PRN Meds: acetaminophen **OR** acetaminophen (TYLENOL) oral liquid 160 mg/5 mL **OR** acetaminophen, oxyCODONE, senna-docusate   Vital Signs    Vitals:   02/14/18 1938 02/14/18 2343 02/15/18 0435 02/15/18 0852  BP: 120/82 132/74 130/84   Pulse: (!) 55 (!) 53 (!) 55   Resp: 20 16 17    Temp: 97.9 F (36.6 C) 97.8 F (36.6 C) 97.8 F (36.6 C) 98.9 F (37.2 C)  TempSrc: Oral Axillary Oral Oral  SpO2: 100% 100% 100%   Weight:      Height:        Intake/Output Summary (Last 24 hours) at 02/15/2018 0940 Last data filed at 02/15/2018 0200 Gross per 24 hour  Intake 720 ml  Output 200 ml  Net 520 ml   Filed Weights   02/13/18 1500  Weight: 113 lb 8.6 oz (51.5 kg)     ECG    Initially atrial flutter type pattern 2-1 AV nodal conduction with periods of atrial fibrillation with rapid ventricular response.  LVH repolarization abnormality apical variant hypertrophy. At last check sinus bradycardia on telemetry.- Personally Reviewed  Physical Exam   GEN: No acute distress.   Neck: No JVD Cardiac: RRR, no murmurs, rubs, or gallops.  Respiratory: Clear to auscultation bilaterally. GI: Soft, nontender, non-distended  MS: No edema; No deformity. Neuro:   Left-sided  hemiparesis Psych: Normal affect   Labs    Chemistry Recent Labs  Lab 02/13/18 1125 02/14/18 0314 02/15/18 0405  NA 144 142 140  K 3.5 3.3* 3.7  CL 107 111 113*  CO2 23 24 23   GLUCOSE 97 98 84  BUN 13 15 17   CREATININE 1.10* 0.92 0.94  CALCIUM 9.9 8.9 8.4*  PROT 6.9  --   --   ALBUMIN 4.0  --   --   AST 35  --   --   ALT 27  --   --   ALKPHOS 81  --   --   BILITOT 1.0  --   --   GFRNONAA 53* >60 >60  GFRAA >60 >60 >60  ANIONGAP 14 7 4*     Hematology Recent Labs  Lab 02/13/18 1125 02/14/18 0314  WBC 8.4 7.7  RBC 5.69* 5.08  HGB 12.4 11.1*  HCT 40.3 35.8*  MCV 70.8* 70.5*  MCH 21.8* 21.9*  MCHC 30.8 31.0  RDW 16.0* 15.8*  PLT 218 171    Cardiac Enzymes Recent Labs  Lab 02/13/18 1545 02/13/18 2118  TROPONINI 0.08* 0.07*    Recent Labs  Lab 02/13/18 1129  TROPIPOC 0.05     BNPNo results for input(s): BNP, PROBNP in the last 168 hours.   DDimer  Recent  Labs  Lab 02/13/18 1125  DDIMER 0.84*     Radiology    Ct Angio Head W Or Wo Contrast  Result Date: 02/14/2018 CLINICAL DATA:  Followup stroke. EXAM: CT ANGIOGRAPHY HEAD AND NECK TECHNIQUE: Multidetector CT imaging of the head and neck was performed using the standard protocol during bolus administration of intravenous contrast. Multiplanar CT image reconstructions and MIPs were obtained to evaluate the vascular anatomy. Carotid stenosis measurements (when applicable) are obtained utilizing NASCET criteria, using the distal internal carotid diameter as the denominator. CONTRAST:  42mL ISOVUE-370 IOPAMIDOL (ISOVUE-370) INJECTION 76% COMPARISON:  CT done yesterday.  MRI 12/05/2007. FINDINGS: CTA NECK FINDINGS Aortic arch: Aortic atherosclerosis. Aneurysmal dilatation of the ascending aorta with diameter of 4.4 cm. Branching pattern of the brachiocephalic vessels is normal without origin stenosis. Right carotid system: Common carotid artery is patent to the bifurcation. Complex calcified and soft plaque  at the carotid bifurcation with occlusion of the internal carotid artery. The external carotid is widely patent. Left carotid system: Common carotid artery shows soft plaque with stenosis of 40% throughout its course to the bifurcation. At the carotid bifurcation, there is soft and calcified plaque with occlusion of the internal carotid artery. Vertebral arteries: Right vertebral artery is dominant. Both vertebral artery origins are widely patent. Both vertebral arteries show scattered atherosclerotic plaque but no flow limiting stenosis. Both vertebral arteries are patent through the cervical region to the foramen magnum. Skeleton: Ordinary mid cervical spondylosis. Other neck: No mass or lymphadenopathy.  Thyroid nodules. Upper chest: Mild pulmonary scarring. Review of the MIP images confirms the above findings CTA HEAD FINDINGS Anterior circulation: No antegrade flow in the internal carotid arteries through the skull base or siphon regions. Reconstituted flow in the supraclinoid internal carotid arteries, probably secondary to external to internal collaterals and patent posterior communicating arteries. The anterior and middle cerebral vessels show flow without correctable proximal stenosis or large missing branch. Distal vessels show atherosclerotic irregularity. Posterior circulation: Both vertebral arteries are patent through the foramen magnum to the basilar. No basilar stenosis. There is some atherosclerotic irregularity of the basilar artery. Both posterior cerebral arteries show flow at their origins, but there is occlusion of the right PCA just beyond its origin. Some collateral flow is present distal to that, but it appears diminished. Venous sinuses: Patent and normal. Anatomic variants: None significant Delayed phase: No abnormal enhancement. 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  Left carotid occlusion is newly seen since the previous study of 2009. Reconstituted flow in the supraclinoid internal carotid arteries due to patent posterior communicating arteries and external to internal collaterals. Atherosclerotic irregularity of the anterior and middle cerebral branches, but with flow present. No vertebral stenosis. Atherosclerotic irregularity of the basilar but without flow limiting stenosis. Occlusion of the right PCA just beyond its origin, with more distal flow probably deriving from collateral vessels. Electronically Signed   By: Nelson Chimes M.D.   On: 02/14/2018 11:28   Dg Shoulder Right  Result Date: 02/13/2018 CLINICAL DATA:  [place] at home today BILATERAL shoulder pain worse on LEFT than RIGHT EXAM: RIGHT SHOULDER - 2+ VIEW COMPARISON:  None FINDINGS: Osseous mineralization diffusely decreased. AC joint alignment normal. No acute fracture, dislocation, or bone destruction. Visualized RIGHT ribs intact. IMPRESSION: No acute osseous abnormalities. Electronically Signed   By: Lavonia Dana M.D.   On: 02/13/2018 14:50   Ct Head Wo Contrast  Result Date: 02/13/2018 CLINICAL DATA:  Status post fall.  Found on floor.  Weakness. EXAM: CT HEAD WITHOUT CONTRAST CT CERVICAL SPINE WITHOUT CONTRAST TECHNIQUE: Multidetector CT imaging of the head and cervical spine was performed following the standard protocol without intravenous contrast. Multiplanar CT image reconstructions of the cervical spine were also generated. COMPARISON:  4/23/9 FINDINGS: CT HEAD FINDINGS Brain: There is a large asymmetric area of low attenuation involving the right temporal lobe and right occipital lobe which is new from the previous exam. Also new from the previous exam is left frontal lobe cortical and subcortical encephalomalacia compatible with a chronic infarct. There is moderate, progressive diffuse low-attenuation within the subcortical and periventricular white matter compatible with chronic microvascular  disease. A chronic appearing lacunar infarct within the posterior right basal ganglia noted. Moderately progressive atrophy with prominence of the sulci and ventricles noted bilaterally. Vascular: Atherosclerotic calcifications noted. No hyperdense vessel or unexpected calcification noted. Skull: Normal. Negative for fracture or focal lesion. Sinuses/Orbits: No acute finding. Other: None CT CERVICAL SPINE FINDINGS Alignment: Normal. Skull base and vertebrae: No acute fracture. No primary bone lesion or focal pathologic process. Soft tissues and spinal canal: None Disc levels: Multi level degenerative disc disease noted. Most advanced at C4-5, C5-6 and C6-7. Upper chest: Aortic atherosclerosis noted. Other: None IMPRESSION: 1. There is a moderate to large area of low attenuation involving the right temporal lobe and right occipital lobe compatible with an acute or subacute right PCA infarct. No hemorrhage identified and no significant mass effect. 2. Chronic left frontal lobe infarct with progressive global atrophy and chronic small vessel ischemic change. 3. No evidence for cervical spine fracture or dislocation. 4. Cervical spondylosis. 5. Critical Value/emergent results were called by telephone at the time of interpretation on 02/13/2018 at 12:24 pm to [facility], who verbally acknowledged these results. Electronically Signed   By: Kerby Moors M.D.   On: 02/13/2018 12:24   Ct Angio Neck W Or Wo Contrast  Result Date: 02/14/2018 CLINICAL DATA:  Followup stroke. EXAM: CT ANGIOGRAPHY HEAD AND NECK TECHNIQUE: Multidetector CT imaging of the head and neck was performed using the standard protocol during bolus administration of intravenous contrast. Multiplanar CT image reconstructions and MIPs were obtained to evaluate the vascular anatomy. Carotid stenosis measurements (when applicable) are obtained utilizing NASCET criteria, using the distal internal carotid diameter as the denominator. CONTRAST:  57mL  ISOVUE-370 IOPAMIDOL (ISOVUE-370) INJECTION 76% COMPARISON:  CT done yesterday.  MRI 12/05/2007. FINDINGS: CTA NECK FINDINGS Aortic arch: Aortic atherosclerosis. Aneurysmal dilatation of the ascending aorta with diameter of 4.4 cm. Branching pattern of the brachiocephalic vessels is normal without origin stenosis. Right carotid system: Common carotid artery is patent to the bifurcation. Complex calcified and soft plaque at the carotid bifurcation with occlusion of the internal carotid artery. The external carotid is widely patent. Left carotid system: Common carotid artery shows soft plaque with stenosis of 40% throughout its course to the bifurcation. At the carotid bifurcation, there is soft and calcified plaque with occlusion of the internal carotid artery. Vertebral arteries: Right vertebral artery is dominant. Both vertebral artery origins are widely patent. Both vertebral arteries show scattered atherosclerotic plaque but no flow limiting stenosis. Both vertebral arteries are patent through the cervical region to the foramen magnum. Skeleton: Ordinary mid cervical spondylosis. Other neck: No mass or lymphadenopathy.  Thyroid nodules. Upper chest: Mild pulmonary scarring. Review of the MIP images confirms the above findings CTA HEAD FINDINGS Anterior circulation: No antegrade flow in  the internal carotid arteries through the skull base or siphon regions. Reconstituted flow in the supraclinoid internal carotid arteries, probably secondary to external to internal collaterals and patent posterior communicating arteries. The anterior and middle cerebral vessels show flow without correctable proximal stenosis or large missing branch. Distal vessels show atherosclerotic irregularity. Posterior circulation: Both vertebral arteries are patent through the foramen magnum to the basilar. No basilar stenosis. There is some atherosclerotic irregularity of the basilar artery. Both posterior cerebral arteries show flow at  their origins, but there is occlusion of the right PCA just beyond its origin. Some collateral flow is present distal to that, but it appears diminished. Venous sinuses: Patent and normal. Anatomic variants: None significant Delayed phase: No abnormal enhancement. 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 Left carotid occlusion is newly seen since the previous study of 2009. Reconstituted flow in the supraclinoid internal carotid arteries due to patent posterior communicating arteries and external to internal collaterals. Atherosclerotic irregularity of the anterior and middle cerebral branches, but with flow present. No vertebral stenosis. Atherosclerotic irregularity of the basilar but without flow limiting stenosis. Occlusion of the right PCA just beyond its origin, with more distal flow probably deriving from collateral vessels. Electronically Signed   By: Nelson Chimes M.D.   On: 02/14/2018 11:28   Ct Angio Chest Pe W And/or Wo Contrast  Result Date: 02/13/2018 CLINICAL DATA:  Chest pain and weakness EXAM: CT ANGIOGRAPHY CHEST WITH CONTRAST TECHNIQUE: Multidetector CT imaging of the chest was performed using the standard protocol during bolus administration of intravenous contrast. Multiplanar CT image reconstructions and MIPs were obtained to evaluate the vascular anatomy. CONTRAST:  177mL ISOVUE-370 IOPAMIDOL (ISOVUE-370) INJECTION 76% COMPARISON:  Chest radiograph August 24, 2010 FINDINGS: Cardiovascular: There is no demonstrable pulmonary embolus. The ascending thoracic aorta has a maximum transverse diameter of 4.3 x 4.3 cm. The measured diameter at the aortic arch is 3.6 cm. No dissection is evident. It must be cautioned that the contrast bolus in the aorta is not sufficient to exclude the possibility of dissection is a differential consideration. There is aortic atherosclerosis.  There are a few scattered foci of calcification in visualized great vessels. There are foci of native coronary artery calcification evident. Patient is status post coronary artery bypass grafting. There is no pericardial effusion or pericardial thickening. The main pulmonary outflow tract measures 3.4 cm, enlarged. Mediastinum/Nodes: Visualized thyroid appears normal. There is no appreciable thoracic adenopathy. No esophageal lesions are evident. Lungs/Pleura: There is mild lower lobe atelectasis bilaterally. There is no lung edema or consolidation. No pleural effusion or pleural thickening evident. Upper Abdomen: In the visualized upper abdomen, there is atherosclerotic calcification in the aorta. There also scattered renal artery calcifications. Visualized upper abdominal structures otherwise appear unremarkable. Musculoskeletal: Patient is status post median sternotomy. No blastic or lytic bone lesions. No chest wall lesions evident. Review of the MIP images confirms the above findings. IMPRESSION: 1.  No demonstrable pulmonary embolus. 2. Prominence of the ascending thoracic aorta measuring 4.3 x 4.3 cm. Prominence at the arch level measuring 3.6 cm in diameter. Recommend annual imaging followup by CTA or MRA. This recommendation follows 2010 ACCF/AHA/AATS/ACR/ASA/SCA/SCAI/SIR/STS/SVM Guidelines for the Diagnosis and Management of Patients with Thoracic Aortic Disease. Circulation.2010; 121: T517-O160. No dissection evident. It must be cautioned that the contrast bolus in the aorta is insufficient to exclude dissection as a potential differential diagnostic consideration from a  radiographic standpoint. There is aortic atherosclerosis. Patient is status post coronary artery bypass grafting. 3. Prominence of the main pulmonary outflow tract measuring 3.4 cm, a finding felt to be indicative of a degree of pulmonary arterial hypertension. 4. No lung edema or consolidation. Areas of lower lobe atelectasis. 5.  No  evident thoracic adenopathy. Aortic Atherosclerosis (ICD10-I70.0). Electronically Signed   By: Lowella Grip III M.D.   On: 02/13/2018 13:36   Ct Cervical Spine Wo Contrast  Result Date: 02/13/2018 CLINICAL DATA:  Status post fall.  Found on floor.  Weakness. EXAM: CT HEAD WITHOUT CONTRAST CT CERVICAL SPINE WITHOUT CONTRAST TECHNIQUE: Multidetector CT imaging of the head and cervical spine was performed following the standard protocol without intravenous contrast. Multiplanar CT image reconstructions of the cervical spine were also generated. COMPARISON:  4/23/9 FINDINGS: CT HEAD FINDINGS Brain: There is a large asymmetric area of low attenuation involving the right temporal lobe and right occipital lobe which is new from the previous exam. Also new from the previous exam is left frontal lobe cortical and subcortical encephalomalacia compatible with a chronic infarct. There is moderate, progressive diffuse low-attenuation within the subcortical and periventricular white matter compatible with chronic microvascular disease. A chronic appearing lacunar infarct within the posterior right basal ganglia noted. Moderately progressive atrophy with prominence of the sulci and ventricles noted bilaterally. Vascular: Atherosclerotic calcifications noted. No hyperdense vessel or unexpected calcification noted. Skull: Normal. Negative for fracture or focal lesion. Sinuses/Orbits: No acute finding. Other: None CT CERVICAL SPINE FINDINGS Alignment: Normal. Skull base and vertebrae: No acute fracture. No primary bone lesion or focal pathologic process. Soft tissues and spinal canal: None Disc levels: Multi level degenerative disc disease noted. Most advanced at C4-5, C5-6 and C6-7. Upper chest: Aortic atherosclerosis noted. Other: None IMPRESSION: 1. There is a moderate to large area of low attenuation involving the right temporal lobe and right occipital lobe compatible with an acute or subacute right PCA infarct. No  hemorrhage identified and no significant mass effect. 2. Chronic left frontal lobe infarct with progressive global atrophy and chronic small vessel ischemic change. 3. No evidence for cervical spine fracture or dislocation. 4. Cervical spondylosis. 5. Critical Value/emergent results were called by telephone at the time of interpretation on 02/13/2018 at 12:24 pm to [facility] [place], who verbally acknowledged these results. Electronically Signed   By: Kerby Moors M.D.   On: 02/13/2018 12:24   Ct Thoracic Spine Wo Contrast  Result Date: 02/13/2018 CLINICAL DATA:  Back pain with minor trauma. Found on floor. Generalized weakness EXAM: CT THORACIC AND LUMBAR SPINE WITHOUT CONTRAST TECHNIQUE: Multidetector CT imaging of the thoracic and lumbar spine was performed without contrast. Multiplanar CT image reconstructions were also generated. COMPARISON:  None. FINDINGS: CT THORACIC SPINE FINDINGS Alignment: No traumatic malalignment. Mild thoracic dextrocurvature which could be positional. Vertebrae: Negative for fracture, endplate erosion, or aggressive bone lesion. Paraspinal and other soft tissues: Intrathoracic findings described on dedicated CT. Small intramuscular lipoma within the right trapezius, right para median. Disc levels: Minor spondylosis.  No visible cord impingement CT LUMBAR SPINE FINDINGS Segmentation: Articulation between the L5 left transverse process and sacrum. Alignment: Normal Vertebrae: Negative for fracture or visible bone lesion. Paraspinal and other soft tissues: No acute finding. Atherosclerosis with 27 mm diameter infrarenal aorta. Colonic diverticulosis. Disc levels: Minor disc bulging.  No impingement IMPRESSION: No acute finding in thoracic or lumbar spine. Electronically Signed   By: Monte Fantasia M.D.   On: 02/13/2018 13:33   Ct  Lumbar Spine Wo Contrast  Result Date: 02/13/2018 CLINICAL DATA:  Back pain with minor trauma. Found on floor. Generalized weakness EXAM: CT THORACIC AND  LUMBAR SPINE WITHOUT CONTRAST TECHNIQUE: Multidetector CT imaging of the thoracic and lumbar spine was performed without contrast. Multiplanar CT image reconstructions were also generated. COMPARISON:  None. FINDINGS: CT THORACIC SPINE FINDINGS Alignment: No traumatic malalignment. Mild thoracic dextrocurvature which could be positional. Vertebrae: Negative for fracture, endplate erosion, or aggressive bone lesion. Paraspinal and other soft tissues: Intrathoracic findings described on dedicated CT. Small intramuscular lipoma within the right trapezius, right para median. Disc levels: Minor spondylosis.  No visible cord impingement CT LUMBAR SPINE FINDINGS Segmentation: Articulation between the L5 left transverse process and sacrum. Alignment: Normal Vertebrae: Negative for fracture or visible bone lesion. Paraspinal and other soft tissues: No acute finding. Atherosclerosis with 27 mm diameter infrarenal aorta. Colonic diverticulosis. Disc levels: Minor disc bulging.  No impingement IMPRESSION: No acute finding in thoracic or lumbar spine. Electronically Signed   By: Monte Fantasia M.D.   On: 02/13/2018 13:33   Mr Brain Wo Contrast  Result Date: 02/14/2018 CLINICAL DATA:  62 year old female found down yesterday with acute to subacute appearing right PCA territory infarct on noncontrast head CT. CTA head and neck today reveals right PCA P1 occlusion, and also bilateral ICA origin occlusion with reconstituted flow at the carotid termini due to circle-of-Willis collaterals. EXAM: MRI HEAD WITHOUT CONTRAST TECHNIQUE: Multiplanar, multiecho pulse sequences of the brain and surrounding structures were obtained without intravenous contrast. COMPARISON:  CTA head and neck 1046 hours today. Head CT 02/13/2018. Brain MRI 12/05/2007. FINDINGS: Study is intermittently degraded by motion artifact despite repeated imaging attempts. Brain: Confluent restricted diffusion throughout the right PCA territory encompassing a 9-10  centimeter area of the right occipital and temporal lobes (series 5, image 42). Scattered small foci of restricted diffusion also in the right thalamus and junction with the right midbrain. Some of the posterior limb right internal capsule is likely affected. There is a superimposed chronic lacune of the lateral right thalamus. Cytotoxic edema throughout the affected territory with petechial hemorrhage in the right occipital pole. Mild regional mass effect appears stable since yesterday. No other restricted diffusion. Chronic posterior MCA territory encephalomalacia in the posterosuperior frontal and superior parietal lobes. Some chronic hemosiderin is associated. Additional chronic anterior left MCA territory cortical encephalomalacia, also affecting the anterior left corona radiata. No midline shift, mass effect, evidence of mass lesion, ventriculomegaly, extra-axial collection. Cervicomedullary junction and pituitary are within normal limits. Vascular: Proximal posterior circulation vascular flow voids are preserved. Loss of the right ICA flow void is chronic, but loss of the left ICA flow void in the neck and much of the siphon is new since the 2009 MRI. Evidence of reconstituted bilateral ICA termini flow. Skull and upper cervical spine: Mild cervical spine degeneration. Visualized bone marrow signal is within normal limits. Sinuses/Orbits: Negative. Other: Mastoid air cells are clear. Scalp and face soft tissues appear negative. IMPRESSION: 1. Large acute right PCA territory infarct concordant with the right PCA occlusion demonstrated by CTA. Cytotoxic edema and petechial hemorrhage. No malignant hemorrhagic transformation or significant mass effect. 2. No other acute intracranial abnormality. 3. 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 Electronically Signed   By: Genevie Ann M.D.   On: 02/14/2018 19:25    Dg Shoulder Left  Result Date: 02/13/2018 CLINICAL DATA:  [place] at home  today BILATERAL shoulder pain worse on LEFT than RIGHT EXAM: LEFT SHOULDER - 2+ VIEW COMPARISON:  Rif 12/01/2008 FINDINGS: Osseous demineralization. AC joint alignment normal. No acute fracture, dislocation, or bone destruction. Small calcified ossicle lateral to the acromion appears stable. Visualized LEFT ribs intact. Atherosclerotic calcifications aorta and postsurgical changes of CABG noted. IMPRESSION: No acute LEFT shoulder abnormalities. Electronically Signed   By: Lavonia Dana M.D.   On: 02/13/2018 14:49    Cardiac Studies   - Left ventricle: The cavity size was normal. Wall thickness was   increased in a pattern of moderate LVH. There was hypertrophy of   the apex. The appearance was consistent with hypertrophic   cardiomyopathy. Systolic function was normal. The estimated   ejection fraction was in the range of 65% to 70%. There was no   dynamic obstruction. There is hypokinesis of the   basalinferolateral and inferior myocardium. Indeterminate   diastolic function. - Aortic valve: Mildly calcified annulus. Trileaflet. - Aorta: Ascending aortic diameter: 44 mm (S). - Ascending aorta: The ascending aorta was moderately dilated. - Mitral valve: Mildly thickened leaflets. There was mild   regurgitation. - Left atrium: The atrium was mildly to moderately dilated. - Right atrium: The atrium was mildly dilated. Central venous   pressure (est): 3 mm Hg. - Atrial septum: No defect or patent foramen ovale was identified. - Tricuspid valve: There was mild regurgitation. - Pulmonary arteries: PA peak pressure: 31 mm Hg (S). - Pericardium, extracardiac: There was no pericardial effusion.  Patient Profile     62 y.o. female with dense left-sided hemiparesis, paroxysmal atrial fibrillation flutter, CAD, hypertrophic apical variant cardiomyopathy, hypertension.  Assessment & Plan    Paroxysmal atrial fibrillation -  Agree with starting Eliquis in 5 to 7 days given extent of stroke.  This will help minimize the risk of hemorrhagic conversion.  Neurology's note reviewed.  Currently in sinus rhythm. - Stop both aspirin and Plavix once Eliquis is started.  Coronary artery disease status post CABG - No anginal symptoms.  Stable.  Continue with aggressive secondary prevention.  Hypertrophic cardiomyopathy apical variant - I personally reviewed her echocardiogram and agree that her apex is hypertrophied, Spade like as previously described on cardiac catheterization in 2008.  No early family history of sudden cardiac death.  Beta-blocker/ACE inhibitor as tolerated.  Essential hypertension -Beta-blocker ACE inhibitor as tolerated.  Hyperlipidemia - Continue high intensity statin  Dilated aortic root -Should not be of any clinical significance at this point.  Continue with blood pressure control.  [facility] will sign off.   Medication Recommendations: Start Eliquis in 5 to 7 days per neurology.  Stop Plavix and aspirin at that time.  Other recommendations (labs, testing, etc): None Follow up as an outpatient: She should have routine follow-up with cardiology as outpatient in 2 to 3 months, Dr. Percival Spanish or APP.  For questions or updates, please contact [place] Please consult [email or web address] for contact info under Cardiology/STEMI.      Signed, Candee Furbish, MD  02/15/2018, 9:40 AM

## 2018-02-15 NOTE — Progress Notes (Addendum)
STROKE TEAM PROGRESS NOTE   SUBJECTIVE (INTERVAL HISTORY) Her RN is at the bedside.  Overall she feels her condition is unchanged. She complains profusely of pain from her fall. Likes the pain shot, does not feel the pill helps much, "I want that shot, give it to me". Likes strawberry ensure, refuses to drink the vanilla one available.   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 Right arm and leg strong. Left arm and leg less strong.   OBJECTIVE Temp:  [97.8 F (36.6 C)-98.9 F (37.2 C)] 98.9 F (37.2 C) (07/05 0852) Pulse Rate:  [53-67] 59 (07/05 0920) Cardiac Rhythm: Sinus bradycardia (07/04 2108) Resp:  [13-26] 14 (07/05 0920) BP: (108-176)/(74-115) 156/115 (07/05 0920) SpO2:  [44 %-100 %] 100 % (07/05 0920)  No results for input(s): GLUCAP in the last 168 hours. Recent Labs  Lab 02/13/18 1125 02/14/18 0314 02/15/18 0405  NA 144 142 140  K 3.5 3.3* 3.7  CL 107 111 113*  CO2 23 24 23   GLUCOSE 97 98 84  BUN 13 15 17   CREATININE 1.10* 0.92 0.94  CALCIUM 9.9 8.9 8.4*   Recent Labs  Lab 02/13/18 1125  AST 35  ALT 27  ALKPHOS 81  BILITOT 1.0  PROT 6.9  ALBUMIN 4.0   Recent Labs  Lab 02/13/18 1125 02/14/18 0314  WBC 8.4 7.7  NEUTROABS 5.7  --   HGB 12.4 11.1*  HCT 40.3 35.8*  MCV 70.8* 70.5*  PLT 218 171   Recent Labs  Lab 02/13/18 1545 02/13/18 2118  TROPONINI 0.08* 0.07*   No results for input(s): LABPROT, INR in the last 72 hours. Recent Labs    02/13/18 1209  COLORURINE YELLOW  LABSPEC 1.014  PHURINE 5.0  GLUCOSEU NEGATIVE  HGBUR SMALL*  BILIRUBINUR NEGATIVE  KETONESUR 5*  PROTEINUR 100*  NITRITE NEGATIVE  LEUKOCYTESUR NEGATIVE       Component Value Date/Time   CHOL 160 02/14/2018 0314   TRIG 78 02/14/2018 0314   HDL 44 02/14/2018 0314   CHOLHDL 3.6 02/14/2018 0314   VLDL 16 02/14/2018 0314   LDLCALC 100 (H) 02/14/2018 0314   Lab Results  Component Value Date   HGBA1C 5.8 (H) 02/14/2018      Component Value Date/Time   LABOPIA  POSITIVE (A) 02/13/2018 1209   COCAINSCRNUR NONE DETECTED 02/13/2018 1209   COCAINSCRNUR NEGATIVE 12/06/2007 1432   LABBENZ NONE DETECTED 02/13/2018 1209   LABBENZ NEGATIVE 12/06/2007 1432   AMPHETMU NONE DETECTED 02/13/2018 1209   THCU POSITIVE (A) 02/13/2018 1209   LABBARB (A) 02/13/2018 1209    Result not available. Reagent lot number recalled by manufacturer.    Recent Labs  Lab 02/13/18 1125  ETH <10    Ct Angio Head W Or Wo Contrast  Result Date: 02/14/2018 CLINICAL DATA:  Followup stroke. EXAM: CT ANGIOGRAPHY HEAD AND NECK TECHNIQUE: Multidetector CT imaging of the head and neck was performed using the standard protocol during bolus administration of intravenous contrast. Multiplanar CT image reconstructions and MIPs were obtained to evaluate the vascular anatomy. Carotid stenosis measurements (when applicable) are obtained utilizing NASCET criteria, using the distal internal carotid diameter as the denominator. CONTRAST:  39mL ISOVUE-370 IOPAMIDOL (ISOVUE-370) INJECTION 76% COMPARISON:  CT done yesterday.  MRI 12/05/2007. FINDINGS: CTA NECK FINDINGS Aortic arch: Aortic atherosclerosis. Aneurysmal dilatation of the ascending aorta with diameter of 4.4 cm. Branching pattern of the brachiocephalic vessels is normal without origin stenosis. Right carotid system: Common carotid artery is patent to the bifurcation. Complex calcified  and soft plaque at the carotid bifurcation with occlusion of the internal carotid artery. The external carotid is widely patent. Left carotid system: Common carotid artery shows soft plaque with stenosis of 40% throughout its course to the bifurcation. At the carotid bifurcation, there is soft and calcified plaque with occlusion of the internal carotid artery. Vertebral arteries: Right vertebral artery is dominant. Both vertebral artery origins are widely patent. Both vertebral arteries show scattered atherosclerotic plaque but no flow limiting stenosis. Both vertebral  arteries are patent through the cervical region to the foramen magnum. Skeleton: Ordinary mid cervical spondylosis. Other neck: No mass or lymphadenopathy.  Thyroid nodules. Upper chest: Mild pulmonary scarring. Review of the MIP images confirms the above findings CTA HEAD FINDINGS Anterior circulation: No antegrade flow in the internal carotid arteries through the skull base or siphon regions. Reconstituted flow in the supraclinoid internal carotid arteries, probably secondary to external to internal collaterals and patent posterior communicating arteries. The anterior and middle cerebral vessels show flow without correctable proximal stenosis or large missing branch. Distal vessels show atherosclerotic irregularity. Posterior circulation: Both vertebral arteries are patent through the foramen magnum to the basilar. No basilar stenosis. There is some atherosclerotic irregularity of the basilar artery. Both posterior cerebral arteries show flow at their origins, but there is occlusion of the right PCA just beyond its origin. Some collateral flow is present distal to that, but it appears diminished. Venous sinuses: Patent and normal. Anatomic variants: None significant Delayed phase: No abnormal enhancement. 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 Left carotid occlusion is newly seen since the previous study of 2009. Reconstituted flow in the supraclinoid internal carotid arteries due to patent posterior communicating arteries and external to internal collaterals. Atherosclerotic irregularity of the anterior and middle cerebral branches, but with flow present. No vertebral stenosis. Atherosclerotic irregularity of the basilar but without flow limiting stenosis. Occlusion of the right PCA just beyond its origin, with more distal flow probably deriving from collateral vessels. Electronically Signed    By: Nelson Chimes M.D.   On: 02/14/2018 11:28   Dg Shoulder Right  Result Date: 02/13/2018 CLINICAL DATA:  [place] at home today BILATERAL shoulder pain worse on LEFT than RIGHT EXAM: RIGHT SHOULDER - 2+ VIEW COMPARISON:  None FINDINGS: Osseous mineralization diffusely decreased. AC joint alignment normal. No acute fracture, dislocation, or bone destruction. Visualized RIGHT ribs intact. IMPRESSION: No acute osseous abnormalities. Electronically Signed   By: Lavonia Dana M.D.   On: 02/13/2018 14:50   Ct Head Wo Contrast  Result Date: 02/13/2018 CLINICAL DATA:  Status post fall.  Found on floor.  Weakness. EXAM: CT HEAD WITHOUT CONTRAST CT CERVICAL SPINE WITHOUT CONTRAST TECHNIQUE: Multidetector CT imaging of the head and cervical spine was performed following the standard protocol without intravenous contrast. Multiplanar CT image reconstructions of the cervical spine were also generated. COMPARISON:  4/23/9 FINDINGS: CT HEAD FINDINGS Brain: There is a large asymmetric area of low attenuation involving the right temporal lobe and right occipital lobe which is new from the previous exam. Also new from the previous exam is left frontal lobe cortical and subcortical encephalomalacia compatible with a chronic infarct. There is moderate, progressive diffuse low-attenuation within the subcortical and periventricular white matter compatible with chronic microvascular disease. A chronic appearing lacunar infarct within the posterior right basal ganglia noted. Moderately progressive atrophy with prominence of the sulci and ventricles noted bilaterally.  Vascular: Atherosclerotic calcifications noted. No hyperdense vessel or unexpected calcification noted. Skull: Normal. Negative for fracture or focal lesion. Sinuses/Orbits: No acute finding. Other: None CT CERVICAL SPINE FINDINGS Alignment: Normal. Skull base and vertebrae: No acute fracture. No primary bone lesion or focal pathologic process. Soft tissues and spinal  canal: None Disc levels: Multi level degenerative disc disease noted. Most advanced at C4-5, C5-6 and C6-7. Upper chest: Aortic atherosclerosis noted. Other: None IMPRESSION: 1. There is a moderate to large area of low attenuation involving the right temporal lobe and right occipital lobe compatible with an acute or subacute right PCA infarct. No hemorrhage identified and no significant mass effect. 2. Chronic left frontal lobe infarct with progressive global atrophy and chronic small vessel ischemic change. 3. No evidence for cervical spine fracture or dislocation. 4. Cervical spondylosis. 5. Critical Value/emergent results were called by telephone at the time of interpretation on 02/13/2018 at 12:24 pm to [facility], [place], who verbally acknowledged these results. Electronically Signed   By: Kerby Moors M.D.   On: 02/13/2018 12:24   Ct Angio Neck W Or Wo Contrast  Result Date: 02/14/2018 CLINICAL DATA:  Followup stroke. EXAM: CT ANGIOGRAPHY HEAD AND NECK TECHNIQUE: Multidetector CT imaging of the head and neck was performed using the standard protocol during bolus administration of intravenous contrast. Multiplanar CT image reconstructions and MIPs were obtained to evaluate the vascular anatomy. Carotid stenosis measurements (when applicable) are obtained utilizing NASCET criteria, using the distal internal carotid diameter as the denominator. CONTRAST:  76mL ISOVUE-370 IOPAMIDOL (ISOVUE-370) INJECTION 76% COMPARISON:  CT done yesterday.  MRI 12/05/2007. FINDINGS: CTA NECK FINDINGS Aortic arch: Aortic atherosclerosis. Aneurysmal dilatation of the ascending aorta with diameter of 4.4 cm. Branching pattern of the brachiocephalic vessels is normal without origin stenosis. Right carotid system: Common carotid artery is patent to the bifurcation. Complex calcified and soft plaque at the carotid bifurcation with occlusion of the internal carotid artery. The external carotid is widely patent. Left carotid system: Common  carotid artery shows soft plaque with stenosis of 40% throughout its course to the bifurcation. At the carotid bifurcation, there is soft and calcified plaque with occlusion of the internal carotid artery. Vertebral arteries: Right vertebral artery is dominant. Both vertebral artery origins are widely patent. Both vertebral arteries show scattered atherosclerotic plaque but no flow limiting stenosis. Both vertebral arteries are patent through the cervical region to the foramen magnum. Skeleton: Ordinary mid cervical spondylosis. Other neck: No mass or lymphadenopathy.  Thyroid nodules. Upper chest: Mild pulmonary scarring. Review of the MIP images confirms the above findings CTA HEAD FINDINGS Anterior circulation: No antegrade flow in the internal carotid arteries through the skull base or siphon regions. Reconstituted flow in the supraclinoid internal carotid arteries, probably secondary to external to internal collaterals and patent posterior communicating arteries. The anterior and middle cerebral vessels show flow without correctable proximal stenosis or large missing branch. Distal vessels show atherosclerotic irregularity. Posterior circulation: Both vertebral arteries are patent through the foramen magnum to the basilar. No basilar stenosis. There is some atherosclerotic irregularity of the basilar artery. Both posterior cerebral arteries show flow at their origins, but there is occlusion of the right PCA just beyond its origin. Some collateral flow is present distal to that, but it appears diminished. Venous sinuses: Patent and normal. Anatomic variants: None significant Delayed phase: No abnormal enhancement. 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  Left carotid occlusion is newly seen since the previous study of 2009. Reconstituted flow in the supraclinoid internal carotid arteries due  to patent posterior communicating arteries and external to internal collaterals. Atherosclerotic irregularity of the anterior and middle cerebral branches, but with flow present. No vertebral stenosis. Atherosclerotic irregularity of the basilar but without flow limiting stenosis. Occlusion of the right PCA just beyond its origin, with more distal flow probably deriving from collateral vessels. Electronically Signed   By: Nelson Chimes M.D.   On: 02/14/2018 11:28   Ct Angio Chest Pe W And/or Wo Contrast  Result Date: 02/13/2018 CLINICAL DATA:  Chest pain and weakness EXAM: CT ANGIOGRAPHY CHEST WITH CONTRAST TECHNIQUE: Multidetector CT imaging of the chest was performed using the standard protocol during bolus administration of intravenous contrast. Multiplanar CT image reconstructions and MIPs were obtained to evaluate the vascular anatomy. CONTRAST:  123mL ISOVUE-370 IOPAMIDOL (ISOVUE-370) INJECTION 76% COMPARISON:  Chest radiograph August 24, 2010 FINDINGS: Cardiovascular: There is no demonstrable pulmonary embolus. The ascending thoracic aorta has a maximum transverse diameter of 4.3 x 4.3 cm. The measured diameter at the aortic arch is 3.6 cm. No dissection is evident. It must be cautioned that the contrast bolus in the aorta is not sufficient to exclude the possibility of dissection is a differential consideration. There is aortic atherosclerosis. There are a few scattered foci of calcification in visualized great vessels. There are foci of native coronary artery calcification evident. Patient is status post coronary artery bypass grafting. There is no pericardial effusion or pericardial thickening. The main pulmonary outflow tract measures 3.4 cm, enlarged. Mediastinum/Nodes: Visualized thyroid appears normal. There is no appreciable thoracic adenopathy. No esophageal lesions are evident. Lungs/Pleura: There is mild lower lobe atelectasis bilaterally. There is no lung edema or consolidation. No pleural  effusion or pleural thickening evident. Upper Abdomen: In the visualized upper abdomen, there is atherosclerotic calcification in the aorta. There also scattered renal artery calcifications. Visualized upper abdominal structures otherwise appear unremarkable. Musculoskeletal: Patient is status post median sternotomy. No blastic or lytic bone lesions. No chest wall lesions evident. Review of the MIP images confirms the above findings. IMPRESSION: 1.  No demonstrable pulmonary embolus. 2. Prominence of the ascending thoracic aorta measuring 4.3 x 4.3 cm. Prominence at the arch level measuring 3.6 cm in diameter. Recommend annual imaging followup by CTA or MRA. This recommendation follows 2010 ACCF/AHA/AATS/ACR/ASA/SCA/SCAI/SIR/STS/SVM Guidelines for the Diagnosis and Management of Patients with Thoracic Aortic Disease. Circulation.2010; 121: X324-M010. No dissection evident. It must be cautioned that the contrast bolus in the aorta is insufficient to exclude dissection as a potential differential diagnostic consideration from a radiographic standpoint. There is aortic atherosclerosis. Patient is status post coronary artery bypass grafting. 3. Prominence of the main pulmonary outflow tract measuring 3.4 cm, a finding felt to be indicative of a degree of pulmonary arterial hypertension. 4. No lung edema or consolidation. Areas of lower lobe atelectasis. 5.  No evident thoracic adenopathy. Aortic Atherosclerosis (ICD10-I70.0). Electronically Signed   By: Lowella Grip III M.D.   On: 02/13/2018 13:36   Ct Cervical Spine Wo Contrast  Result Date: 02/13/2018 CLINICAL DATA:  Status post fall.  Found on floor.  Weakness. EXAM: CT HEAD WITHOUT CONTRAST CT CERVICAL SPINE WITHOUT CONTRAST TECHNIQUE: Multidetector CT imaging of the head and cervical spine was performed following the standard protocol without intravenous contrast. Multiplanar CT image reconstructions of the cervical spine were also generated. COMPARISON:   4/23/9 FINDINGS: CT HEAD FINDINGS Brain: There is a large  asymmetric area of low attenuation involving the right temporal lobe and right occipital lobe which is new from the previous exam. Also new from the previous exam is left frontal lobe cortical and subcortical encephalomalacia compatible with a chronic infarct. There is moderate, progressive diffuse low-attenuation within the subcortical and periventricular white matter compatible with chronic microvascular disease. A chronic appearing lacunar infarct within the posterior right basal ganglia noted. Moderately progressive atrophy with prominence of the sulci and ventricles noted bilaterally. Vascular: Atherosclerotic calcifications noted. No hyperdense vessel or unexpected calcification noted. Skull: Normal. Negative for fracture or focal lesion. Sinuses/Orbits: No acute finding. Other: None CT CERVICAL SPINE FINDINGS Alignment: Normal. Skull base and vertebrae: No acute fracture. No primary bone lesion or focal pathologic process. Soft tissues and spinal canal: None Disc levels: Multi level degenerative disc disease noted. Most advanced at C4-5, C5-6 and C6-7. Upper chest: Aortic atherosclerosis noted. Other: None IMPRESSION: 1. There is a moderate to large area of low attenuation involving the right temporal lobe and right occipital lobe compatible with an acute or subacute right PCA infarct. No hemorrhage identified and no significant mass effect. 2. Chronic left frontal lobe infarct with progressive global atrophy and chronic small vessel ischemic change. 3. No evidence for cervical spine fracture or dislocation. 4. Cervical spondylosis. 5. Critical Value/emergent results were called by telephone at the time of interpretation on 02/13/2018 at 12:24 pm to [facility], [place], who verbally acknowledged these results. Electronically Signed   By: Kerby Moors M.D.   On: 02/13/2018 12:24   Ct Thoracic Spine Wo Contrast  Result Date: 02/13/2018 CLINICAL DATA:  Back  pain with minor trauma. Found on floor. Generalized weakness EXAM: CT THORACIC AND LUMBAR SPINE WITHOUT CONTRAST TECHNIQUE: Multidetector CT imaging of the thoracic and lumbar spine was performed without contrast. Multiplanar CT image reconstructions were also generated. COMPARISON:  None. FINDINGS: CT THORACIC SPINE FINDINGS Alignment: No traumatic malalignment. Mild thoracic dextrocurvature which could be positional. Vertebrae: Negative for fracture, endplate erosion, or aggressive bone lesion. Paraspinal and other soft tissues: Intrathoracic findings described on dedicated CT. Small intramuscular lipoma within the right trapezius, right para median. Disc levels: Minor spondylosis.  No visible cord impingement CT LUMBAR SPINE FINDINGS Segmentation: Articulation between the L5 left transverse process and sacrum. Alignment: Normal Vertebrae: Negative for fracture or visible bone lesion. Paraspinal and other soft tissues: No acute finding. Atherosclerosis with 27 mm diameter infrarenal aorta. Colonic diverticulosis. Disc levels: Minor disc bulging.  No impingement IMPRESSION: No acute finding in thoracic or lumbar spine. Electronically Signed   By: Monte Fantasia M.D.   On: 02/13/2018 13:33   Ct Lumbar Spine Wo Contrast  Result Date: 02/13/2018 CLINICAL DATA:  Back pain with minor trauma. Found on floor. Generalized weakness EXAM: CT THORACIC AND LUMBAR SPINE WITHOUT CONTRAST TECHNIQUE: Multidetector CT imaging of the thoracic and lumbar spine was performed without contrast. Multiplanar CT image reconstructions were also generated. COMPARISON:  None. FINDINGS: CT THORACIC SPINE FINDINGS Alignment: No traumatic malalignment. Mild thoracic dextrocurvature which could be positional. Vertebrae: Negative for fracture, endplate erosion, or aggressive bone lesion. Paraspinal and other soft tissues: Intrathoracic findings described on dedicated CT. Small intramuscular lipoma within the right trapezius, right para median.  Disc levels: Minor spondylosis.  No visible cord impingement CT LUMBAR SPINE FINDINGS Segmentation: Articulation between the L5 left transverse process and sacrum. Alignment: Normal Vertebrae: Negative for fracture or visible bone lesion. Paraspinal and other soft tissues: No acute finding. Atherosclerosis with 27 mm diameter infrarenal aorta.  Colonic diverticulosis. Disc levels: Minor disc bulging.  No impingement IMPRESSION: No acute finding in thoracic or lumbar spine. Electronically Signed   By: Monte Fantasia M.D.   On: 02/13/2018 13:33   Mr Brain Wo Contrast  Result Date: 02/14/2018 CLINICAL DATA:  62 year old female found down yesterday with acute to subacute appearing right PCA territory infarct on noncontrast head CT. CTA head and neck today reveals right PCA P1 occlusion, and also bilateral ICA origin occlusion with reconstituted flow at the carotid termini due to circle-of-Willis collaterals. EXAM: MRI HEAD WITHOUT CONTRAST TECHNIQUE: Multiplanar, multiecho pulse sequences of the brain and surrounding structures were obtained without intravenous contrast. COMPARISON:  CTA head and neck 1046 hours today. Head CT 02/13/2018. Brain MRI 12/05/2007. FINDINGS: Study is intermittently degraded by motion artifact despite repeated imaging attempts. Brain: Confluent restricted diffusion throughout the right PCA territory encompassing a 9-10 centimeter area of the right occipital and temporal lobes (series 5, image 42). Scattered small foci of restricted diffusion also in the right thalamus and junction with the right midbrain. Some of the posterior limb right internal capsule is likely affected. There is a superimposed chronic lacune of the lateral right thalamus. Cytotoxic edema throughout the affected territory with petechial hemorrhage in the right occipital pole. Mild regional mass effect appears stable since yesterday. No other restricted diffusion. Chronic posterior MCA territory encephalomalacia in the  posterosuperior frontal and superior parietal lobes. Some chronic hemosiderin is associated. Additional chronic anterior left MCA territory cortical encephalomalacia, also affecting the anterior left corona radiata. No midline shift, mass effect, evidence of mass lesion, ventriculomegaly, extra-axial collection. Cervicomedullary junction and pituitary are within normal limits. Vascular: Proximal posterior circulation vascular flow voids are preserved. Loss of the right ICA flow void is chronic, but loss of the left ICA flow void in the neck and much of the siphon is new since the 2009 MRI. Evidence of reconstituted bilateral ICA termini flow. Skull and upper cervical spine: Mild cervical spine degeneration. Visualized bone marrow signal is within normal limits. Sinuses/Orbits: Negative. Other: Mastoid air cells are clear. Scalp and face soft tissues appear negative. IMPRESSION: 1. Large acute right PCA territory infarct concordant with the right PCA occlusion demonstrated by CTA. Cytotoxic edema and petechial hemorrhage. No malignant hemorrhagic transformation or significant mass effect. 2. No other acute intracranial abnormality. 3. 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 Electronically Signed   By: Genevie Ann M.D.   On: 02/14/2018 19:25   Dg Shoulder Left  Result Date: 02/13/2018 CLINICAL DATA:  [place] at home today BILATERAL shoulder pain worse on LEFT than RIGHT EXAM: LEFT SHOULDER - 2+ VIEW COMPARISON:  Rif 12/01/2008 FINDINGS: Osseous demineralization. AC joint alignment normal. No acute fracture, dislocation, or bone destruction. Small calcified ossicle lateral to the acromion appears stable. Visualized LEFT ribs intact. Atherosclerotic calcifications aorta and postsurgical changes of CABG noted. IMPRESSION: No acute LEFT shoulder abnormalities. Electronically Signed   By: Lavonia Dana M.D.   On:  02/13/2018 14:49   Carotid Doppler   Unable to visualize flow in bilateral internal carotid arteries by color, power, and pulsed wave Doppler, therefore findings are suggestive of bilateral internal carotid artery occlusion. Left proximal internal carotid artery exhibits echogenic material, with an anechoic lumen distal to this area. This is suggestive of possible thrombus/embolic material. Bilateral external carotid arteries are patent with low-resistant flow. Bilateral vertebral arteries are patent with low-resistant flow.  TTE  -  Left ventricle: The cavity size was normal. Wall thickness was increased in a pattern of moderate LVH. Systolic function was normal. The estimated ejection fraction was in the range of 60% to 65%. There is hypokinesis of the basalinferolateral and inferior myocardium. Indeterminate diastolic function. - Aortic valve: Mildly calcified annulus. Trileaflet. - Aorta: Ascending aortic diameter: 44 mm (S). - Mitral valve: Mildly thickened leaflets. There was mild regurgitation. - Left atrium: The atrium was mildly to moderately dilated. - Right atrium: The atrium was mildly dilated. Central venous pressure (est): 3 mm Hg. - Atrial septum: No defect or patent foramen ovale was identified. - Tricuspid valve: There was mild regurgitation. - Pulmonary arteries: PA peak pressure: 31 mm Hg (S). - Pericardium, extracardiac: There was no pericardial effusion.  TCD A very technically limited and difficult study due to continuous patient vocalization and inability to stay still. unable to insonate posterior circulation bilaterally via transoccpital window, right anterior circulation via transtemporal window as well  as limited view of right transorbital window. Mean flow velocities were decreased in left MCA, ACA, terminal ICA, PCA indicating proximal left carotid occlusion or severe stenosis. Flow reversal at left opthalmic artery consistent with left carotid systme high grade  stenosis or occlusion. Right ACA flow reveral indicates left to right intracranial collateralization, suggesting right carotid system high grade stenosis or occlusion. Bi-directional left ICA siphon and antegrade flow at right ICA siphon may be explained by posterior to anterior collateralization from bilateral PCOMs. cerebral vascular imaging studies such as CTA or MRA head, or catheter-based cerebral angiography for further evaluation are highly recomended.   PHYSICAL EXAM  Temp:  [97.8 F (36.6 C)-98.9 F (37.2 C)] 98.9 F (37.2 C) (07/05 0852) Pulse Rate:  [53-67] 59 (07/05 0920) Resp:  [13-26] 14 (07/05 0920) BP: (108-176)/(74-115) 156/115 (07/05 0920) SpO2:  [44 %-100 %] 100 % (07/05 0920)  General - Well nourished, well developed, in mild distress of whole body pain from fall. Bruising noted L shoulder, R>L knee.  Ophthalmologic - fundi not visualized   Cardiovascular - Regular rate and rhythm.  Mental Status -  Level of arousal and orientation to time, place, and person were intact. Language including expression, naming, repetition, comprehension was assessed and found intact. Fund of Knowledge was assessed and was intact.  Cranial Nerves II - XII - II - dense left hemianopia. III, IV, VI - Extraocular movements intact. V - Facial sensation decreased on the left, 50% of the right. VII - Facial movement intact bilaterally. VIII - Hearing & vestibular intact bilaterally. X - Palate elevates symmetrically. XI - Chin turning & shoulder shrug intact bilaterally. XII - Tongue protrusion intact.  Motor Strength - The patient's strength was normal in RUE and RLE, but 4/5 LUE and LLE due to pain and pronator drift was absent.  Bulk was normal and fasciculations were absent.   Motor Tone - Muscle tone was assessed at the neck and appendages and was normal.  Reflexes - The patient's reflexes were symmetrical in all extremities and she had no pathological reflexes.  Sensory -  Light touch, temperature/pinprick were assessed and were decreased on the left  Coordination - The patient had normal movements in both hands with no ataxia or dysmetria, however slow on the left.  Tremor was absent.  Gait and Station - deferred.   ASSESSMENT/PLAN Ms. Zohal Reny is a 62 y.o. female with history of CAD s/p CABG in 2008, stroke in 2009 with right thalamic/IC infarct, chronic right ICA occlusion, HTN,  HLD admitted for dizziness, fall and left hemianopia. No tPA given due to OSW.    Stroke:  right PCA infarct due to right PCA occlusion, embolic pattern, secondary to newly diagnosed afib  Resultant left hemianopia  CTA head and neck - bilateral ICA occluded at origin, right PCA occlusion, BA irregularity, b/l MCAs patent from collaterals  Carotid Doppler b/l ICA occlusion, possible left ICA fresh clot  MRI  Large R PCA infarct w/ edema and petechial hemorrhage. B ICA occlusion. Old B L>R MCA infarcts  2D Echo  EF 60-65%  TCD - limited study, left ophthalmic A and right ACA flow reversal, consistent with b/l carotid system occlusion  LDL 100  HgbA1c 5.8  lovenox for VTE prophylaxis  aspirin 81 mg daily and clopidogrel 75 mg daily prior to admission, now on aspirin 325 mg daily and clopidogrel 75 mg daily. Recommend to start anticoagulation 5-7 days post stroke (02/20/18) given the large size of stroke to avoid hemorrhagic conversion. Stop Plavix once Eliquis is started. Given her extensive cerebral and cardiac vasculopathy, recommend continue ASA 81 on top of eliquis once eliquis started.  Patient counseled to be compliant with her antithrombotic medications  Ongoing aggressive stroke risk factor management  Therapy recommendations:  SNF  Disposition:  Pending  PAF with RVR  New diagnosis  Now back to NSR  Cardiology on board  metoprolol d/c'ed due to low BP and HR  Will start anticoagulation 5-7 days post stroke (12/21/17) given the large size of  stroke to avoid hemorrhagic conversion. Stop Plavix once Eliquis is started. Given her extensive cerebral and cardiac vasculopathy, recommend continue ASA 81 on top of eliquis once eliquis started.  History of stroke  11/2007 - right thalamic/IC infarct on MRI, MRA showed right ICA occluded, left ICA siphon severe stenosis, b/l PCA stenosis, R>L.   CT this admission also showed chronic left frontal small infarct. MRI showed B L>R MCA infarcts.  On ASA and plavix at home  Cerebral vascular occlusion/stenosis  CTA head and neck showed bilateral ICA occluded at origin, right PCA occlusion, BA irregularity  CUS b/l ICA occlusion, left ICA occlusion may be acute  Asymptomatic left ICA occlusion - ? Chronic (severe left ICA siphon stenosis 2009)  TCD - limited study, left ophthalmic A and right ACA flow reversal, consistent with b/l carotid system occlusion  Hypertension Stable Permissive hypertension (OK if <220/120) for 24-48 hours post stroke and then gradually decrease to goal within 3-5 days. BP goal 130-160 given b/l ICA occlusion Metoprolol discontinued given low BP and HR  Hyperlipidemia  Home meds:  crestor   LDL 100, goal < 70  Now on lipitor 80  Continue statin at discharge  Other Stroke Risk Factors  Advanced age  Coronary artery disease s/p  CABG 2008  Other Active Problems  Left shoulder pain after all  B/l hip pain after fall   [place] for d/c from stroke standpoint  Follow-up Stroke Clinic at [facility] in 4 weeks. Office will call with appointment date and time. Order placed.   NOTHING FURTHER TO ADD FROM THE STROKE STANDPOINT  Patient has a 10-15% risk of having another stroke over the next year, the highest risk is within 2 weeks of the most recent stroke/TIA (risk of having a stroke following a stroke or TIA is the same).  Ongoing risk factor control by Primary Care Physician  Stroke Service will sign off. Please call should any  needs arise.  Follow-up Stroke Clinic at  [facility] in 4 weeks, order placed.  Hospital day # 2  Burnetta Sabin, MSN, APRN, ANVP-BC, AGPCNP-BC Advanced Practice Stroke Nurse Lily for Schedule & Pager information 02/15/2018 1:40 PM   ATTENDING NOTE: I reviewed above note and agree with the assessment and plan. I have made any additions or clarifications directly to the above note. Pt was seen and examined.   Pt neuro stable. MRI showed right PCA large infarct, no anterior circulation infarcts bilaterally, indicating b/l ICA occlusion may be chronic, although left ICA occlusion might be recent if not too long. Continue DAPT for now but recommend to start eliquis on 12/21/17 to avoid hemorrhagic conversion. plavix can be d/c but recommend continue ASA 81 once eliquis started given extensive cardiac and cerebral vasculopathy. Continue statin. BP goal 130-160. Continue PT/OT for left hemianopia. No driving unless hemianopia resolves.   Neurology will sign off. Please call with questions. Pt will follow up with stroke clinic NP at [facility] in about 4 weeks. Thanks for the consult.   Rosalin Hawking, MD PhD Stroke Neurology 02/15/2018 4:03 PM     To contact Stroke Continuity provider, please refer to [email or web address] After hours, contact General Neurology

## 2018-02-15 NOTE — Evaluation (Signed)
Occupational Therapy Evaluation Patient Details Name: Kimberly [name] MRN: [id number] DOB: [birth year] Today's Date: 02/15/2018    History of Present Illness Kimberly [name] is a 62 y.o. 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 Has L sided deficits at baseline since her prior CVA. She was found on the floor by her daughter, and is now complaining of severe back pain. CT scan showed moderate to large area of low attenuation involving the right temporal lobe and right occipital lobe compatible with an acute or subacute right PCA infarct. CT clear for R & L shoulder cervical, lumbar, and thoracic spine.    Clinical Impression   PTA Pt was Mod I for ADL/IADL and used SPC for mobility. Patient today requiring Mod A +2 for bed mobility and transfer with assist needed to power up and for LE management during transfer. Once sitting EOB, Pt was able to minimally participate in grooming tasks, but overall max A for ADL. Pt's biggest impact is limited by pain and vision changes (please see vision section below) Pt will benefit from skilled OT in the acute setting and post-acute SNF to maximize safety and independence in ADL and functional transfers as well as address visual deficits.    Follow Up Recommendations  SNF;Supervision/Assistance - 24 hour    Equipment Recommendations  3 in 1 bedside commode;Tub/shower seat;Wheelchair (measurements OT);Wheelchair cushion (measurements OT);Other (comment)(defer to next venue)    Recommendations for Other Services       Precautions / Restrictions Precautions Precautions: Fall Restrictions Weight Bearing Restrictions: No      Mobility Bed Mobility Overal bed mobility: Needs Assistance Bed Mobility: Supine to Sit     Supine to sit: Mod assist;+2 for physical assistance     General bed mobility comments: able to initiate bringing LE towards EOB but does  require verbal and tactile cueing  Transfers Overall transfer level: Needs assistance Equipment used: 2 person hand held assist Transfers: Sit to/from Stand;Stand Pivot Transfers Sit to Stand: Mod assist;+2 physical assistance Stand pivot transfers: Mod assist;+2 physical assistance       General transfer comment: difficulty with sequencing LE during transfers requiring knee blocking and physical assist to move LE for safe and efficient transfer    Balance Overall balance assessment: Needs assistance Sitting-balance support: Bilateral upper extremity supported;Feet supported Sitting balance-Leahy Scale: Fair                                     ADL either performed or assessed with clinical judgement   ADL Overall ADL's : Needs assistance/impaired Eating/Feeding: Maximal assistance;Sitting Eating/Feeding Details (indicate cue type and reason): pt is requesting assist for all meals, pt is able to bring hand to mouth Grooming: Maximal assistance;Sitting   Upper Body Bathing: Maximal assistance;Sitting   Lower Body Bathing: Maximal assistance;Sitting/lateral leans   Upper Body Dressing : Maximal assistance   Lower Body Dressing: Total assistance   Toilet Transfer: Moderate assistance;+2 for physical assistance;+2 for safety/equipment;Stand-pivot;BSC Toilet Transfer Details (indicate cue type and reason): face to face Toileting- Clothing Manipulation and Hygiene: Total assistance       Functional mobility during ADLs: Moderate assistance;+2 for physical assistance;+2 for safety/equipment       Vision   Vision Assessment?: Yes(attempted) Eye Alignment: Within Functional Limits Ocular Range of Motion: Restricted on the left;Impaired-to be further tested in functional  context Tracking/Visual Pursuits: Decreased smoothness of horizontal tracking Visual Fields: Impaired-to be further tested in functional context Additional Comments: Pt struggled to follow  commands for visual assessment - could not tell me the color of my scrubs "blue" (they are purple)     Perception     Praxis      Pertinent Vitals/Pain Pain Assessment: 0-10 Pain Score: 8  Pain Location: L shoulder, back and head Pain Descriptors / Indicators: Grimacing;Moaning Pain Intervention(s): Monitored during session;Repositioned;Limited activity within patient's tolerance     Hand Dominance Right   Extremity/Trunk Assessment Upper Extremity Assessment Upper Extremity Assessment: Generalized weakness(no formal assessment this session, can bring hand to mouth)   Lower Extremity Assessment Lower Extremity Assessment: Defer to PT evaluation       Communication Communication Communication: No difficulties   Cognition Arousal/Alertness: Awake/alert Behavior During Therapy: WFL for tasks assessed/performed Overall Cognitive Status: Impaired/Different from baseline Area of Impairment: Attention;Following commands;Safety/judgement;Problem solving                   Current Attention Level: Selective   Following Commands: Follows one step commands with increased time Safety/Judgement: Decreased awareness of safety;Decreased awareness of deficits   Problem Solving: Slow processing;Decreased initiation;Difficulty sequencing;Requires verbal cues;Requires tactile cues     General Comments  BP at 176/96 after transfer; RN in room administering pain medicine and aware    Exercises     Shoulder Instructions      Home Living Family/patient expects to be discharged to:: Private residence Living Arrangements: Alone   Type of Home: Apartment Home Access: Stairs to enter [employer] of Steps: 2 Entrance Stairs-Rails: None Home Layout: One level     Bathroom Shower/Tub: Teacher, early years/pre: Standard     Home Equipment: Radio producer - single point      Lives With: Alone    Prior Functioning/Environment Level of Independence: Independent  with assistive device(s)        Comments: SPC for all mobility; likes to read        OT Problem List: Decreased strength;Decreased activity tolerance;Impaired balance (sitting and/or standing);Impaired vision/perception;Decreased range of motion;Decreased coordination;Decreased cognition;Decreased safety awareness;Decreased knowledge of use of DME or AE;Decreased knowledge of precautions;Pain      OT Treatment/Interventions: Self-care/ADL training;DME and/or AE instruction;Therapeutic activities;Cognitive remediation/compensation;Visual/perceptual remediation/compensation;Patient/family education;Balance training    OT Goals(Current goals can be found in the care plan section) Acute Rehab OT Goals Patient Stated Goal: "I want you to help my pain" OT Goal Formulation: With patient Time For Goal Achievement: 03/01/18 Potential to Achieve Goals: Good ADL Goals Pt Will Perform Eating: with set-up;sitting Pt Will Perform Grooming: with set-up;sitting Pt Will Transfer to Toilet: with min guard assist;stand pivot transfer;bedside commode Pt Will Perform Toileting - Clothing Manipulation and hygiene: with mod assist;sit to/from stand Additional ADL Goal #1: Pt will recall 3 ways of compensating for visual deficits with 1 or less verbal cue  OT Frequency: Min 2X/week   Barriers to D/C: Inaccessible home environment;Decreased caregiver support  Pt lives alone       Co-evaluation PT/OT/SLP Co-Evaluation/Treatment: Yes Reason for Co-Treatment: Complexity of the patient's impairments (multi-system involvement);For patient/therapist safety;To address functional/ADL transfers;Necessary to address cognition/behavior during functional activity PT goals addressed during session: Mobility/safety with mobility;Balance OT goals addressed during session: ADL's and self-care;Strengthening/ROM      AM-PAC PT "6 Clicks" Daily Activity     Outcome Measure Help from another person eating meals?: A  Lot Help from another person taking care of  personal grooming?: A Lot Help from another person toileting, which includes using toliet, bedpan, or urinal?: Total Help from another person bathing (including washing, rinsing, drying)?: Total Help from another person to put on and taking off regular upper body clothing?: A Lot Help from another person to put on and taking off regular lower body clothing?: Total 6 Click Score: 9   End of Session Equipment Utilized During Treatment: Gait belt Nurse Communication: Mobility status;Other (comment)(in room)  Activity Tolerance: Patient limited by pain Patient left: in chair;with call bell/phone within reach;with chair alarm set;with nursing/sitter in room  OT Visit Diagnosis: Unsteadiness on feet (R26.81);Other abnormalities of gait and mobility (R26.89);Muscle weakness (generalized) (M62.81);Low vision, both eyes (H54.2);Other symptoms and signs involving the nervous system (R29.898);Other symptoms and signs involving cognitive function;Pain Pain - Right/Left: Left Pain - part of body: Shoulder(back)                Time: 4734-0370 OT Time Calculation (min): 25 min Charges:  OT General Charges $OT Visit: 1 Visit OT Evaluation $OT Eval Moderate Complexity: 1 Mod G-Codes:     Hulda Humphrey OTR/L [place] 02/15/2018, 10:20 AM

## 2018-02-16 DIAGNOSIS — I63012 Cerebral infarction due to thrombosis of left vertebral artery: Secondary | ICD-10-CM

## 2018-02-16 LAB — BASIC METABOLIC PANEL
ANION GAP: 6 (ref 5–15)
BUN: 10 mg/dL (ref 8–23)
CO2: 25 mmol/L (ref 22–32)
Calcium: 9 mg/dL (ref 8.9–10.3)
Chloride: 107 mmol/L (ref 98–111)
Creatinine, Ser: 0.78 mg/dL (ref 0.44–1.00)
GFR calc non Af Amer: >60 mL/min (ref >60)
GLUCOSE: 82 mg/dL (ref 70–99)
Potassium: 3.5 mmol/L (ref 3.5–5.1)
SODIUM: 138 mmol/L (ref 135–145)

## 2018-02-16 MED ORDER — PANTOPRAZOLE SODIUM 40 MG PO TBEC
40.0000 mg | DELAYED_RELEASE_TABLET | Freq: Every day | ORAL | Status: DC
  Administered 2018-02-17 – 2018-02-18 (×2): 40 mg via ORAL
  Filled 2018-02-16 (×2): qty 1

## 2018-02-16 MED ORDER — MAGNESIUM SULFATE IN D5W 1-5 GM/100ML-% IV SOLN
1.0000 g | Freq: Once | INTRAVENOUS | Status: DC
  Filled 2018-02-16: qty 100

## 2018-02-16 MED ORDER — APIXABAN 5 MG PO TABS: 5.0000 mg | ORAL_TABLET | Freq: Two times a day (BID) | ORAL | Status: DC

## 2018-02-16 MED ORDER — CLOPIDOGREL BISULFATE 75 MG PO TABS
75.0000 mg | ORAL_TABLET | Freq: Every day | ORAL | Status: DC
  Administered 2018-02-17 – 2018-02-18 (×2): 75 mg via ORAL
  Filled 2018-02-16 (×2): qty 1

## 2018-02-16 MED ORDER — HALOPERIDOL LACTATE 5 MG/ML IJ SOLN: 2.0000 mg | Freq: Four times a day (QID) | INTRAMUSCULAR | Status: DC | PRN

## 2018-02-16 MED ORDER — POTASSIUM CHLORIDE CRYS ER 20 MEQ PO TBCR
40.0000 meq | EXTENDED_RELEASE_TABLET | Freq: Once | ORAL | Status: DC
  Filled 2018-02-16: qty 2

## 2018-02-16 MED ORDER — ASPIRIN EC 81 MG PO TBEC: 81.0000 mg | DELAYED_RELEASE_TABLET | Freq: Every day | ORAL | Status: DC

## 2018-02-16 NOTE — Clinical Social Work Note (Signed)
Clinical Social Work Assessment  Patient Details  Name: Kimberly [name] MRN: [id number] Date of Birth: [birth year]  Date of referral:  02/16/18               Reason for consult:  Facility Placement, Discharge Planning                Permission sought to share information with:  Chartered certified accountant granted to share information::  Yes, Verbal Permission Granted  Name::        Agency::  SNF's  Relationship::     Contact Information:     Housing/Transportation Living arrangements for the past 2 months:  Apartment Source of Information:  Patient, Medical Team Patient Interpreter Needed:  None Criminal Activity/Legal Involvement Pertinent to Current Situation/Hospitalization:  No - Comment as needed Significant Relationships:  Adult Children, Siblings Lives with:  Self Do you feel safe going back to the place where you live?  Yes Need for family participation in patient care:  Yes (Comment)  Care giving concerns:  PT recommending SNF once medically stable for discharge.   Social Worker assessment / plan:  CSW met with patient. No supports at bedside. CSW introduced role and explained that PT recommendations would be discussed. Patient is agreeable to SNF. No facility preference. Discussed Medicaid barriers: She has to stay a minimum of 30 days for Medicaid to pay and her check minus $30 goes directly to the facility. Patient expressed understanding. SNF list provided for review. No further concerns. CSW encouraged patient to contact CSW as needed. CSW will continue to follow patient for support and facilitate discharge to SNF once medically stable.  Employment status:  Unemployed Forensic scientist:  Medicaid In [place] PT Recommendations:  [place] / Referral to community resources:  [place]  Patient/Family's Response to care:  Patient agreeable to SNF placement. Patient's family supportive and involved in patient's  care. Patient appreciated social work intervention.  Patient/Family's Understanding of and Emotional Response to Diagnosis, Current Treatment, and Prognosis:  Patient has a good understanding of the reason for admission and her need for rehab prior to returning home. Patient appears pleased with hospital care.  Emotional Assessment Appearance:  Appears stated age Attitude/Demeanor/Rapport:  Engaged Affect (typically observed):  Accepting Orientation:  Oriented to Self, Oriented to Place, Oriented to  Time, Oriented to Situation Alcohol / Substance use:  Tobacco Use(History of) Psych involvement (Current and /or in the community):  No (Comment)  Discharge Needs  Concerns to be addressed:  Care Coordination Readmission within the last 30 days:  No Current discharge risk:  Dependent with Mobility, Lives alone Barriers to Discharge:  Continued Medical Work up, Inadequate or no insurance   Candie Chroman, LCSW 02/16/2018, 10:59 AM

## 2018-02-16 NOTE — Discharge Instructions (Addendum)
You must take both aspirin and Plavix until 02/19/2018, on 02/20/2018 start Eliquis, continue aspirin and stop Plavix.  Follow with Primary MD Kimberly [name] in 7 days   Get CBC, CMP,  checked  by Primary MD or SNF MD in 5-7 days    Activity: As tolerated with Full fall precautions use walker/cane & assistance as needed  Disposition Home    Diet:  Heart Healthy   For Heart failure patients - Check your Weight same time everyday, if you gain over 2 pounds, or you develop in leg swelling, experience more shortness of breath or chest pain, call your Primary MD immediately. Follow Cardiac Low Salt Diet and 1.5 lit/day fluid restriction.  Special Instructions: If you have smoked or chewed Tobacco  in the last 2 yrs please stop smoking, stop any regular Alcohol  and or any Recreational drug use.  On your next visit with your primary care physician please Get Medicines reviewed and adjusted.  Please request your Prim.MD to go over all Hospital Tests and Procedure/Radiological results at the follow up, please get all Hospital records sent to your Prim MD by signing hospital release before you go home.  If you experience worsening of your admission symptoms, develop shortness of breath, life threatening emergency, suicidal or homicidal thoughts you must seek medical attention immediately by calling 911 or calling your MD immediately  if symptoms less severe.  You Must read complete instructions/literature along with all the possible adverse reactions/side effects for all the Medicines you take and that have been prescribed to you. Take any new Medicines after you have completely understood and accpet all the possible adverse reactions/side effects.   Do not drive, operate heavy machinery, perform activities at heights, swimming or participation in water activities or provide baby sitting services if your were admitted for syncope or siezures until you have seen by Primary MD or a Neurologist and advised  to do so again.  Do not drive when taking Pain medications.    Do not take more than prescribed Pain, Sleep and Anxiety Medications  Wear Seat belts while driving.   Please note  You were cared for by a hospitalist during your hospital stay. If you have any questions about your discharge medications or the care you received while you were in the hospital after you are discharged, you can call the unit and asked to speak with the hospitalist on call if the hospitalist that took care of you is not available. Once you are discharged, your primary care physician will handle any further medical issues. Please note that NO REFILLS for any discharge medications will be authorized once you are discharged, as it is imperative that you return to your primary care physician (or establish a relationship with a primary care physician if you do not have one) for your aftercare needs so that they can reassess your need for medications and monitor your lab values.    Information on my medicine - ELIQUIS (apixaban)  Why was Eliquis prescribed for you? Eliquis was prescribed for you to reduce the risk of a blood clot forming that can cause a stroke if you have a medical condition called atrial fibrillation (a type of irregular heartbeat).  What do You need to know about Eliquis ? Take your Eliquis TWICE DAILY - one tablet in the morning and one tablet in the evening with or without food. If you have difficulty swallowing the tablet whole please discuss with your pharmacist how to take the medication safely.  Take Eliquis exactly as prescribed by your doctor and DO NOT stop taking Eliquis without talking to the doctor who prescribed the medication.  Stopping may increase your risk of developing a stroke.  Refill your prescription before you run out.  After discharge, you should have regular check-up appointments with your healthcare provider that is prescribing your Eliquis.  In the future your dose  may need to be changed if your kidney function or weight changes by a significant amount or as you get older.  What do you do if you miss a dose? If you miss a dose, take it as soon as you remember on the same day and resume taking twice daily.  Do not take more than one dose of ELIQUIS at the same time to make up a missed dose.  Important Safety Information A possible side effect of Eliquis is bleeding. You should call your healthcare provider right away if you experience any of the following: ? Bleeding from an injury or your nose that does not stop. ? Unusual colored urine (red or dark brown) or unusual colored stools (red or black). ? Unusual bruising for unknown reasons. ? A serious fall or if you hit your head (even if there is no bleeding).  Some medicines may interact with Eliquis and might increase your risk of bleeding or clotting while on Eliquis. To help avoid this, consult your healthcare provider or pharmacist prior to using any new prescription or non-prescription medications, including herbals, vitamins, non-steroidal anti-inflammatory drugs (NSAIDs) and supplements.  This website has more information on Eliquis (apixaban): http://www.eliquis.com/eliquis/home

## 2018-02-16 NOTE — [place] FL2 (Signed)
[place] MEDICAID FL2 LEVEL OF CARE SCREENING TOOL     IDENTIFICATION  Patient Name: Kimberly [name] Birthdate: 09-05-1955 Sex: female Admission Date (Current Location): 02/13/2018  [facility] and [place] Number:  Herbalist and Address:  [facility], [place] [address] [place], [place] [address]      Provider Number: [id number]  Attending Physician Name and Address:  Thurnell Lose, MD  Relative Name and Phone Number:       Current Level of Care: Hospital Recommended Level of Care: [place] Prior Approval Number:    Date Approved/Denied:   PASRR Number: [id number]  Discharge Plan: SNF    Current Diagnoses: Patient Active Problem List   Diagnosis Date Noted  . Malnutrition of moderate degree 02/15/2018  . Carotid occlusion, bilateral   . Atrial fibrillation with RVR ([place]) 02/13/2018  . Positive D dimer 02/13/2018  . Back pain 02/13/2018  . CVA (cerebral vascular accident) ([place]) 02/13/2018  . Paroxysmal atrial fibrillation ([place]) 02/13/2018  . Coronary artery disease involving native coronary artery of native heart without angina pectoris 02/13/2018  . Apical variant hypertrophic cardiomyopathy ([place]) 02/13/2018  . 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 Aortic atherosclerosis ([place]) 02/13/2018  . CAD, ARTERY BYPASS GRAFT 05/03/2009  . TOBACCO USE, QUIT 05/03/2009  . ROTATOR CUFF SYNDROME, LEFT 02/12/2009  . Shoulder pain, left 11/03/2008  . HAMMER TOE, ACQUIRED 02/11/2008  . INSECT BITE, LEG 02/11/2008  . Cerebral artery occlusion with cerebral infarction ([place]) 12/05/2007  . Dyslipidemia 11/05/2007  . UNSPECIFIED DISORDER TEETH&SUPPORTING STRUCTURES 11/05/2007  . BICEPS TENDINITIS, RIGHT 04/24/2007  . Essential hypertension 04/23/2007  . MICROCYTOSIS 04/23/2007    Orientation RESPIRATION BLADDER Height & Weight     Self, Time, Situation, Place  Normal Incontinent Weight: 113 lb 8.6 oz (51.5 kg) Height:   5\' 6"  (167.6 cm)(per previous admission documentation)  BEHAVIORAL SYMPTOMS/MOOD NEUROLOGICAL BOWEL NUTRITION STATUS  (None) (CVA) Continent Diet(Heart healthy)  AMBULATORY STATUS COMMUNICATION OF NEEDS Skin   Extensive Assist Verbally Skin abrasions, Bruising, PU Stage and Appropriate Care   PU Stage 2 Dressing: (Mid buttocks: Foam)                   Personal Care Assistance Level of Assistance  Bathing, Feeding, Dressing Bathing Assistance: Maximum assistance Feeding assistance: Maximum assistance Dressing Assistance: Maximum assistance     Functional Limitations Info  Sight, Hearing, Speech Sight Info: Adequate Hearing Info: Adequate Speech Info: Adequate    SPECIAL CARE FACTORS FREQUENCY  PT (By licensed PT), Blood pressure, OT (By licensed OT)     PT Frequency: 5 x week OT Frequency: 5 x week            Contractures Contractures Info: Not present    Additional Factors Info  Code Status, Allergies Code Status Info: Full Allergies Info: Clonidine Derivatives, Sprionolactone           Current Medications (02/16/2018):  This is the current hospital active medication list Current Facility-Administered Medications  Medication Dose Route Frequency Provider Last Rate Last Dose  . 0.9 %  sodium chloride infusion   Intravenous Continuous Jonetta Osgood, MD 10 mL/hr at 02/15/18 1119    . acetaminophen (TYLENOL) tablet 1,000 mg  1,000 mg Oral Q8H Ghimire, Henreitta Leber, MD   1,000 mg at 02/16/18 0441  . aspirin EC tablet 325 mg  325 mg Oral Daily Rosalin Hawking, MD   325 mg at 02/15/18 1100  . atorvastatin (LIPITOR) tablet  80 mg  80 mg Oral q1800 Janora Norlander, MD   80 mg at 02/15/18 1829  . bisacodyl (DULCOLAX) EC tablet 5 mg  5 mg Oral Daily PRN Jonetta Osgood, MD   5 mg at 02/16/18 0441  . clopidogrel (PLAVIX) tablet 75 mg  75 mg Oral Daily Janora Norlander, MD   75 mg at 02/15/18 1100  . colchicine tablet 0.6 mg  0.6 mg Oral BID Janora Norlander, MD   0.6 mg at  02/15/18 2128  . cyclobenzaprine (FLEXERIL) tablet 5 mg  5 mg Oral TID PRN Jonetta Osgood, MD   5 mg at 02/16/18 0441  . enoxaparin (LOVENOX) injection 40 mg  40 mg Subcutaneous Q24H Janora Norlander, MD   40 mg at 02/15/18 1557  . feeding supplement (ENSURE ENLIVE) (ENSURE ENLIVE) liquid 237 mL  237 mL Oral BID BM Ghimire, Shanker M, MD   237 mL at 02/15/18 1500  . haloperidol lactate (HALDOL) injection 2 mg  2 mg Intravenous Q6H PRN Thurnell Lose, MD      . magnesium sulfate IVPB 1 g 100 mL  1 g Intravenous Once Thurnell Lose, MD      . multivitamin with minerals tablet 1 tablet  1 tablet Oral Daily Janora Norlander, MD   1 tablet at 02/15/18 1100  . nicotine (NICODERM CQ - dosed in mg/24 hr) patch 7 mg  7 mg Transdermal Daily Rosalin Hawking, MD   7 mg at 02/16/18 1050  . oxyCODONE (Oxy IR/ROXICODONE) immediate release tablet 5 mg  5 mg Oral Q4H PRN Rosalin Hawking, MD   5 mg at 02/16/18 0745  . pantoprazole (PROTONIX) EC tablet 40 mg  40 mg Oral Daily Lala Lund K, MD      . polyethylene glycol (MIRALAX / GLYCOLAX) packet 17 g  17 g Oral Daily Jonetta Osgood, MD   17 g at 02/15/18 1557  . potassium chloride SA (K-DUR,KLOR-CON) CR tablet 40 mEq  40 mEq Oral Once Thurnell Lose, MD      . senna [facility]) tablet 17.2 mg  2 tablet Oral QHS Jonetta Osgood, MD   17.2 mg at 02/15/18 2128     Discharge Medications: Please see discharge summary for a list of discharge medications.  Relevant Imaging Results:  Relevant Lab Results:   Additional Information SS#: [id number]  Candie Chroman, LCSW

## 2018-02-16 NOTE — Clinical Social Work Placement (Signed)
   CLINICAL SOCIAL WORK PLACEMENT  NOTE  Date:  02/16/2018  Patient Details  Name: Kimberly [name] MRN: [id number] Date of Birth: Apr 29, 1956  Clinical Social Work is seeking post-discharge placement for this patient at the [place] level of care (*CSW will initial, date and re-position this form in  chart as items are completed):  Yes   Patient/family provided with [place] Work Department's list of facilities offering this level of care within the geographic area requested by the patient (or if unable, by the patient's family).  Yes   Patient/family informed of their freedom to choose among providers that offer the needed level of care, that participate in Medicare, Medicaid or managed care program needed by the patient, have an available bed and are willing to accept the patient.  Yes   Patient/family informed of [place]'s ownership interest in [facility] and [facility], as well as of the fact that they are under no obligation to receive care at these facilities.  PASRR submitted to EDS on 02/16/18     PASRR number received on 02/16/18     Existing PASRR number confirmed on       FL2 transmitted to all facilities in geographic area requested by pt/family on 02/16/18     FL2 transmitted to all facilities within larger geographic area on       Patient informed that his/her managed care company has contracts with or will negotiate with certain facilities, including the following:            Patient/family informed of bed offers received.  Patient chooses bed at       Physician recommends and patient chooses bed at      Patient to be transferred to   on  .  Patient to be transferred to facility by       Patient family notified on   of transfer.  Name of family member notified:        PHYSICIAN Please sign FL2     Additional Comment:    _______________________________________________ Candie Chroman, LCSW 02/16/2018,  11:01 AM

## 2018-02-16 NOTE — Progress Notes (Signed)
Patient very irritable, speaking harshly and rudely to staff. Refuses all medications except nicotine patch and pain medication. Complained about cold food, staff to warm plate < 2 minutes from room. Upon return, patient states "It's about time". MD notified of patient's refusal of medications. Patient is alert and oriented X 4. Kimberly [name]

## 2018-02-16 NOTE — Progress Notes (Addendum)
PROGRESS NOTE        PATIENT DETAILS Name: Kimberly [name] Age: 62 y.o. Sex: female Date of Birth: 15-May-1956 Admit Date: 02/13/2018 Admitting Physician Janora Norlander, MD ZOX:[id number], Doreene Burke  Brief Narrative: Patient is a 62 y.o. female with history of CAD status post CABG, prior CVA, dyslipidemia, gout who presented with dizziness, and subsequently sustained a mechanical fall subsequently she started complaining of back pain.  She was brought to the emergency room, where a CT of the head rated a subacute right PCA infarct.  Patient was then admitted to the hospitalist service for further evaluation and treatment.  See below for further details  Subjective:  Patient in bed denies any headache or new complaints, no shortness of breath or abdominal pain.  Assessment/Plan:  Subacute right PCA infarct: Probably embolic given A. fib on admission.CT head on admission showed a right subacute PCA infarct.  RI brain confirmed large PCA territory infarct.  Carotid Doppler showed complete occlusion of bilateral internal carotid artery, as a subsequent CT angiogram of the neck confirmed and of bilateral internal carotid arteries at the origins, however appears to have reconstituted flow in the supraclinoid intracranial arteries from collaterals/posterior communicating arteries.  TTE shows preserved EF without any embolic source. LDL 100, A1c 5.8.  EKG on admission showed A. fib with RVR-but now maintaining sinus rhythm.   Due to large size of her stroke which appears to be embolic likely due to paroxysmal atrial fibrillation recommendation by neurology is dual antiplatelet therapy till 02/20/2018 thereafter switch the patient to Eliquis and aspirin and stop Plavix.  Full stroke protocol being followed likely will require rehab admission.  Continue high intensity statin.  Paroxysmal A. fib with RVR [place] vas 2 score of at least 5: Seems to have reverted back to sinus  rhythm-initially was on metoprolol-but due to bradycardia/hypotension this has been discontinued.  See above regarding plans to initiate anticoagulation 5-7 days post CVA.  Cardiology following.    Back pain: Probably secondary to fall-likely musculoskeletal etiology-CT of the cervical, thoracic and lumbar spine negative for acute fractures.  Supportive care for now-ambulate with physical therapy services-if continues then we can consider further work-up.  Shoulder pain left: Secondary to the fall that she had on admission-she does have a few abrasions around the left shoulder-x-rays did not show any fracture.  There is no swelling-we will continue to provide supportive care-if the pain does not improve in the next few days, will need further imaging with a CT scan.  Apical variant hypertrophic cardiomyopathy: EKG changes very suggestive of hypertrophic cardiomyopathy-confirmed on echocardiogram.  Previously on beta-blocker-but stopped due to development of bradycardia.  She has no history of syncope or sudden death in her family.  Will gradually reintroduce antihypertensive medications over the course of the next 48 to 72 hours in the light of acute stroke.  Hypokalemia: Replaced, will monitor along with magnesium levels.  Hypertension: Permissive hypertension-all antihypertensives currently on hold.    Dyslipidemia: Previously on Crestor 10 mg-been switched to 80 mg of Lipitor due to CVA.    Gout: No evidence of flare-continue with colchicine.  Ascending aortic aneurysm: Stable for outpatient follow-up with her primary outpatient MD.  Delirium(metabolic encephalopathy).  Currently resolved, PRN Haldol.   DVT Prophylaxis: Prophylactic Lovenox   Code Status: Full code   Family Communication: None at bedside on 02/16/2018.  Disposition  Plan: Remain inpatient-we will require several more days of hospitalization-await physical therapy services to determine appropriate disposition as  well.  Antimicrobial agents: Anti-infectives (From admission, onward)   None      Procedures:  MRI - IMPRESSION: 1. Large acute right PCA territory infarct concordant with the right PCA occlusion demonstrated by CTA. Cytotoxic edema and petechial hemorrhage. No malignant hemorrhagic transformation or significant mass effect. 2. No other acute intracranial abnormality. 3. 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  CTA   IMPRESSION: Occlusion of both internal carotid arteries at their origins. 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 Left carotid occlusion is newly seen since the previous study of 2009. Reconstituted flow in the supraclinoid internal carotid arteries due to patent posterior communicating arteries and external to internal collaterals. Atherosclerotic irregularity of the anterior and middle cerebral branches, but with flow present. No vertebral stenosis. Atherosclerotic irregularity of the basilar but without flow limiting stenosis. Occlusion of the right PCA just beyond its origin, with more distal flow probably deriving from collateral vessels.   Carotid Doppler   Unable to visualize flow in bilateral internal carotid arteries by color, power, and pulsed wave Doppler, therefore findings are suggestive of bilateral internal carotid artery occlusion.Left proximal internal carotid artery exhibits echogenic material, with an anechoic lumen distal to this area. This is suggestive of possible thrombus/embolic material. Bilateral external carotid arteries are patent with low-resistant flow. Bilateral vertebral arteries are patent with low-resistant flow.   TTE  - Left ventricle: The cavity size was normal. Wall thickness wasincreased in a pattern of moderate LVH. Systolic function wasnormal. The estimated ejection fraction was in the range of 60%to 65%. There is hypokinesis of the  basalinferolateral andinferior myocardium. Indeterminate diastolic function. - Aortic valve: Mildly calcified annulus. Trileaflet. - Aorta: Ascending aortic diameter: 44 mm (S). - Mitral valve: Mildly thickened leaflets. There was mildregurgitation. - Left atrium: The atrium was mildly to moderately dilated. - Right atrium: The atrium was mildly dilated. Central venouspressure (est): 3 mm Hg. - Atrial septum: No defect or patent foramen ovale was identified. - Tricuspid valve: There was mild regurgitation. - Pulmonary arteries: PA peak pressure: 31 mm Hg (S). - Pericardium, extracardiac: There was no pericardial effusion.   TCD A very technically limited and difficult study due to continuous patient vocalization and inability to stay still. unable to insonate posterior circulation bilaterally via transoccpital window, right anterior circulation via transtemporal window as well  as limited view of right transorbital window. Mean flow velocities were decreased in left MCA, ACA, terminal ICA, PCA indicating proximal left carotid occlusion or severe stenosis. Flow reversal at left opthalmic artery consistent with left carotid systme high grade stenosis or occlusion. Right ACA flow reveral indicates left to right intracranial collateralization, suggesting right carotid system high grade stenosis or occlusion. Bi-directional left ICA siphon and antegrade flow at right ICA siphon may be explained by posterior to anterior collateralization from bilateral PCOMs. cerebral vascular imaging studies such as CTA or MRA head, or catheter-based cerebral angiography for further evaluation are highly recomended.    CONSULTS: Neurology Cardiology  Time spent: 25 minutes-Greater than 50% of this time was spent in counseling, explanation of diagnosis, planning of further management, and coordination of care.  MEDICATIONS: Scheduled Meds: . acetaminophen  1,000 mg Oral Q8H  . aspirin EC  325 mg Oral Daily   . atorvastatin  80 mg Oral q1800  . clopidogrel  75 mg Oral Daily  . colchicine  0.6 mg Oral BID  . enoxaparin (LOVENOX) injection  40 mg Subcutaneous Q24H  . feeding supplement (ENSURE ENLIVE)  237 mL Oral BID BM  . multivitamin with minerals  1 tablet Oral Daily  . nicotine  7 mg Transdermal Daily  . polyethylene glycol  17 g Oral Daily  . potassium chloride  40 mEq Oral Once  . senna  2 tablet Oral QHS   Continuous Infusions: . sodium chloride 10 mL/hr at 02/15/18 1119  . magnesium sulfate 1 - 4 g bolus IVPB     PRN Meds:.bisacodyl, cyclobenzaprine, haloperidol lactate, oxyCODONE   PHYSICAL EXAM: Vital signs: Vitals:   02/15/18 1926 02/15/18 2326 02/16/18 0326 02/16/18 0747  BP: (!) 136/100 (!) 171/94 (!) 167/96 (!) 185/89  Pulse: 69 (!) 59 (!) 56 (!) 51  Resp: 17 18 16 16   Temp: 98 F (36.7 C) 98.2 F (36.8 C) 98.9 F (37.2 C) 97.8 F (36.6 C)  TempSrc: Oral Oral Oral Oral  SpO2: 100% 100% 100% 100%  Weight:      Height:       Filed Weights   02/13/18 1500  Weight: 51.5 kg (113 lb 8.6 oz)   Body mass index is 18.33 kg/m.    Exam  Somnolent but answers all questions and follows basic commands, dense left-sided weakness 3-4/5,   [place].AT Supple Neck,No JVD, No cervical lymphadenopathy appriciated.  Symmetrical Chest wall movement, Good air movement bilaterally, CTAB RRR,No Gallops, Rubs or new Murmurs, No Parasternal Heave +ve B.Sounds, Abd Soft, No tenderness, No organomegaly appriciated, No rebound - guarding or rigidity. No Cyanosis, Clubbing or edema, No new Rash or bruise  I have personally reviewed following labs and imaging studies  LABORATORY DATA: CBC: Recent Labs  Lab 02/13/18 1125 02/14/18 0314  WBC 8.4 7.7  NEUTROABS 5.7  --   HGB 12.4 11.1*  HCT 40.3 35.8*  MCV 70.8* 70.5*  PLT 218 244    Basic Metabolic Panel: Recent Labs  Lab 02/13/18 1125 02/14/18 0314 02/15/18 0405 02/16/18 0719  NA 144 142 140 138  K 3.5 3.3* 3.7 3.5  CL  107 111 113* 107  CO2 23 24 23 25   GLUCOSE 97 98 84 82  BUN 13 15 17 10   CREATININE 1.10* 0.92 0.94 0.78  CALCIUM 9.9 8.9 8.4* 9.0    GFR: Estimated Creatinine Clearance: 60 mL/min (by C-G formula based on SCr of 0.78 mg/dL).  Liver Function Tests: Recent Labs  Lab 02/13/18 1125  AST 35  ALT 27  ALKPHOS 81  BILITOT 1.0  PROT 6.9  ALBUMIN 4.0   No results for input(s): LIPASE, AMYLASE in the last 168 hours. No results for input(s): AMMONIA in the last 168 hours.  Coagulation Profile: No results for input(s): INR, PROTIME in the last 168 hours.  Cardiac Enzymes: Recent Labs  Lab 02/13/18 1545 02/13/18 2118  TROPONINI 0.08* 0.07*    BNP (last 3 results) No results for input(s): PROBNP in the last 8760 hours.  HbA1C: Recent Labs    02/14/18 0314  HGBA1C 5.8*    CBG: No results for input(s): GLUCAP in the last 168 hours.  Lipid Profile: Recent Labs    02/14/18 0314  CHOL 160  HDL 44  LDLCALC 100*  TRIG 78  CHOLHDL 3.6    Thyroid Function Tests: Recent Labs    02/13/18 1125  TSH 1.245    Anemia Panel: No results for input(s): VITAMINB12, FOLATE, FERRITIN, TIBC,  IRON, RETICCTPCT in the last 72 hours.  Urine analysis:    Component Value Date/Time   COLORURINE YELLOW 02/13/2018 1209   APPEARANCEUR CLEAR 02/13/2018 1209   LABSPEC 1.014 02/13/2018 1209   PHURINE 5.0 02/13/2018 1209   GLUCOSEU NEGATIVE 02/13/2018 1209   HGBUR SMALL (A) 02/13/2018 1209   BILIRUBINUR NEGATIVE 02/13/2018 1209   KETONESUR 5 (A) 02/13/2018 1209   PROTEINUR 100 (A) 02/13/2018 1209   UROBILINOGEN 0.2 05/26/2011 2243   NITRITE NEGATIVE 02/13/2018 1209   LEUKOCYTESUR NEGATIVE 02/13/2018 1209    Sepsis Labs: Lactic Acid, Venous No results found for: LATICACIDVEN  MICROBIOLOGY: No results found for this or any previous visit (from the past 240 hour(s)).  RADIOLOGY STUDIES/RESULTS: Ct Angio Head W Or Wo Contrast  Result Date: 02/14/2018 CLINICAL DATA:  Followup  stroke. EXAM: CT ANGIOGRAPHY HEAD AND NECK TECHNIQUE: Multidetector CT imaging of the head and neck was performed using the standard protocol during bolus administration of intravenous contrast. Multiplanar CT image reconstructions and MIPs were obtained to evaluate the vascular anatomy. Carotid stenosis measurements (when applicable) are obtained utilizing NASCET criteria, using the distal internal carotid diameter as the denominator. CONTRAST:  70mL ISOVUE-370 IOPAMIDOL (ISOVUE-370) INJECTION 76% COMPARISON:  CT done yesterday.  MRI 12/05/2007. FINDINGS: CTA NECK FINDINGS Aortic arch: Aortic atherosclerosis. Aneurysmal dilatation of the ascending aorta with diameter of 4.4 cm. Branching pattern of the brachiocephalic vessels is normal without origin stenosis. Right carotid system: Common carotid artery is patent to the bifurcation. Complex calcified and soft plaque at the carotid bifurcation with occlusion of the internal carotid artery. The external carotid is widely patent. Left carotid system: Common carotid artery shows soft plaque with stenosis of 40% throughout its course to the bifurcation. At the carotid bifurcation, there is soft and calcified plaque with occlusion of the internal carotid artery. Vertebral arteries: Right vertebral artery is dominant. Both vertebral artery origins are widely patent. Both vertebral arteries show scattered atherosclerotic plaque but no flow limiting stenosis. Both vertebral arteries are patent through the cervical region to the foramen magnum. Skeleton: Ordinary mid cervical spondylosis. Other neck: No mass or lymphadenopathy.  Thyroid nodules. Upper chest: Mild pulmonary scarring. Review of the MIP images confirms the above findings CTA HEAD FINDINGS Anterior circulation: No antegrade flow in the internal carotid arteries through the skull base or siphon regions. Reconstituted flow in the supraclinoid internal carotid arteries, probably secondary to external to internal  collaterals and patent posterior communicating arteries. The anterior and middle cerebral vessels show flow without correctable proximal stenosis or large missing branch. Distal vessels show atherosclerotic irregularity. Posterior circulation: Both vertebral arteries are patent through the foramen magnum to the basilar. No basilar stenosis. There is some atherosclerotic irregularity of the basilar artery. Both posterior cerebral arteries show flow at their origins, but there is occlusion of the right PCA just beyond its origin. Some collateral flow is present distal to that, but it appears diminished. Venous sinuses: Patent and normal. Anatomic variants: None significant Delayed phase: No abnormal enhancement. 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 Left carotid occlusion is newly seen since the previous study of 2009. Reconstituted flow in the supraclinoid internal carotid arteries due to patent posterior communicating arteries and external to internal collaterals. Atherosclerotic irregularity of the anterior and middle cerebral branches, but with flow present. No vertebral stenosis. Atherosclerotic irregularity of the basilar but without flow limiting stenosis. Occlusion of the  right PCA just beyond its origin, with more distal flow probably deriving from collateral vessels. Electronically Signed   By: Nelson Chimes M.D.   On: 02/14/2018 11:28   Dg Shoulder Right  Result Date: 02/13/2018 CLINICAL DATA:  [place] at home today BILATERAL shoulder pain worse on LEFT than RIGHT EXAM: RIGHT SHOULDER - 2+ VIEW COMPARISON:  None FINDINGS: Osseous mineralization diffusely decreased. AC joint alignment normal. No acute fracture, dislocation, or bone destruction. Visualized RIGHT ribs intact. IMPRESSION: No acute osseous abnormalities. Electronically Signed   By: Lavonia Dana M.D.   On: 02/13/2018 14:50    Ct Head Wo Contrast  Result Date: 02/13/2018 CLINICAL DATA:  Status post fall.  Found on floor.  Weakness. EXAM: CT HEAD WITHOUT CONTRAST CT CERVICAL SPINE WITHOUT CONTRAST TECHNIQUE: Multidetector CT imaging of the head and cervical spine was performed following the standard protocol without intravenous contrast. Multiplanar CT image reconstructions of the cervical spine were also generated. COMPARISON:  4/23/9 FINDINGS: CT HEAD FINDINGS Brain: There is a large asymmetric area of low attenuation involving the right temporal lobe and right occipital lobe which is new from the previous exam. Also new from the previous exam is left frontal lobe cortical and subcortical encephalomalacia compatible with a chronic infarct. There is moderate, progressive diffuse low-attenuation within the subcortical and periventricular white matter compatible with chronic microvascular disease. A chronic appearing lacunar infarct within the posterior right basal ganglia noted. Moderately progressive atrophy with prominence of the sulci and ventricles noted bilaterally. Vascular: Atherosclerotic calcifications noted. No hyperdense vessel or unexpected calcification noted. Skull: Normal. Negative for fracture or focal lesion. Sinuses/Orbits: No acute finding. Other: None CT CERVICAL SPINE FINDINGS Alignment: Normal. Skull base and vertebrae: No acute fracture. No primary bone lesion or focal pathologic process. Soft tissues and spinal canal: None Disc levels: Multi level degenerative disc disease noted. Most advanced at C4-5, C5-6 and C6-7. Upper chest: Aortic atherosclerosis noted. Other: None IMPRESSION: 1. There is a moderate to large area of low attenuation involving the right temporal lobe and right occipital lobe compatible with an acute or subacute right PCA infarct. No hemorrhage identified and no significant mass effect. 2. Chronic left frontal lobe infarct with progressive global atrophy and chronic small vessel ischemic  change. 3. No evidence for cervical spine fracture or dislocation. 4. Cervical spondylosis. 5. Critical Value/emergent results were called by telephone at the time of interpretation on 02/13/2018 at 12:24 pm to [employer] [facility], who verbally acknowledged these results. Electronically Signed   By: Kerby Moors M.D.   On: 02/13/2018 12:24   Ct Angio Neck W Or Wo Contrast  Result Date: 02/14/2018 CLINICAL DATA:  Followup stroke. EXAM: CT ANGIOGRAPHY HEAD AND NECK TECHNIQUE: Multidetector CT imaging of the head and neck was performed using the standard protocol during bolus administration of intravenous contrast. Multiplanar CT image reconstructions and MIPs were obtained to evaluate the vascular anatomy. Carotid stenosis measurements (when applicable) are obtained utilizing NASCET criteria, using the distal internal carotid diameter as the denominator. CONTRAST:  71mL ISOVUE-370 IOPAMIDOL (ISOVUE-370) INJECTION 76% COMPARISON:  CT done yesterday.  MRI 12/05/2007. FINDINGS: CTA NECK FINDINGS Aortic arch: Aortic atherosclerosis. Aneurysmal dilatation of the ascending aorta with diameter of 4.4 cm. Branching pattern of the brachiocephalic vessels is normal without origin stenosis. Right carotid system: Common carotid artery is patent to the bifurcation. Complex calcified and soft plaque at the carotid bifurcation with occlusion of the internal carotid artery. The external carotid is widely patent. Left carotid system: Common  carotid artery shows soft plaque with stenosis of 40% throughout its course to the bifurcation. At the carotid bifurcation, there is soft and calcified plaque with occlusion of the internal carotid artery. Vertebral arteries: Right vertebral artery is dominant. Both vertebral artery origins are widely patent. Both vertebral arteries show scattered atherosclerotic plaque but no flow limiting stenosis. Both vertebral arteries are patent through the cervical region to the foramen magnum. Skeleton:  Ordinary mid cervical spondylosis. Other neck: No mass or lymphadenopathy.  Thyroid nodules. Upper chest: Mild pulmonary scarring. Review of the MIP images confirms the above findings CTA HEAD FINDINGS Anterior circulation: No antegrade flow in the internal carotid arteries through the skull base or siphon regions. Reconstituted flow in the supraclinoid internal carotid arteries, probably secondary to external to internal collaterals and patent posterior communicating arteries. The anterior and middle cerebral vessels show flow without correctable proximal stenosis or large missing branch. Distal vessels show atherosclerotic irregularity. Posterior circulation: Both vertebral arteries are patent through the foramen magnum to the basilar. No basilar stenosis. There is some atherosclerotic irregularity of the basilar artery. Both posterior cerebral arteries show flow at their origins, but there is occlusion of the right PCA just beyond its origin. Some collateral flow is present distal to that, but it appears diminished. Venous sinuses: Patent and normal. Anatomic variants: None significant Delayed phase: No abnormal enhancement. 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 Left carotid occlusion is newly seen since the previous study of 2009. Reconstituted flow in the supraclinoid internal carotid arteries due to patent posterior communicating arteries and external to internal collaterals. Atherosclerotic irregularity of the anterior and middle cerebral branches, but with flow present. No vertebral stenosis. Atherosclerotic irregularity of the basilar but without flow limiting stenosis. Occlusion of the right PCA just beyond its origin, with more distal flow probably deriving from collateral vessels. Electronically Signed   By: Nelson Chimes M.D.   On: 02/14/2018 11:28   Ct Angio Chest Pe W And/or Wo  Contrast  Result Date: 02/13/2018 CLINICAL DATA:  Chest pain and weakness EXAM: CT ANGIOGRAPHY CHEST WITH CONTRAST TECHNIQUE: Multidetector CT imaging of the chest was performed using the standard protocol during bolus administration of intravenous contrast. Multiplanar CT image reconstructions and MIPs were obtained to evaluate the vascular anatomy. CONTRAST:  120mL ISOVUE-370 IOPAMIDOL (ISOVUE-370) INJECTION 76% COMPARISON:  Chest radiograph August 24, 2010 FINDINGS: Cardiovascular: There is no demonstrable pulmonary embolus. The ascending thoracic aorta has a maximum transverse diameter of 4.3 x 4.3 cm. The measured diameter at the aortic arch is 3.6 cm. No dissection is evident. It must be cautioned that the contrast bolus in the aorta is not sufficient to exclude the possibility of dissection is a differential consideration. There is aortic atherosclerosis. There are a few scattered foci of calcification in visualized great vessels. There are foci of native coronary artery calcification evident. Patient is status post coronary artery bypass grafting. There is no pericardial effusion or pericardial thickening. The main pulmonary outflow tract measures 3.4 cm, enlarged. Mediastinum/Nodes: Visualized thyroid appears normal. There is no appreciable thoracic adenopathy. No esophageal lesions are evident. Lungs/Pleura: There is mild lower lobe atelectasis bilaterally. There is no lung edema or consolidation. No pleural effusion or pleural thickening evident. Upper Abdomen: In the visualized upper abdomen, there is atherosclerotic calcification in the aorta. There also scattered renal artery calcifications. Visualized upper abdominal structures otherwise appear unremarkable. Musculoskeletal: Patient is  status post median sternotomy. No blastic or lytic bone lesions. No chest wall lesions evident. Review of the MIP images confirms the above findings. IMPRESSION: 1.  No demonstrable pulmonary embolus. 2. Prominence of  the ascending thoracic aorta measuring 4.3 x 4.3 cm. Prominence at the arch level measuring 3.6 cm in diameter. Recommend annual imaging followup by CTA or MRA. This recommendation follows 2010 ACCF/AHA/AATS/ACR/ASA/SCA/SCAI/SIR/STS/SVM Guidelines for the Diagnosis and Management of Patients with Thoracic Aortic Disease. Circulation.2010; 121: Z610-R604. No dissection evident. It must be cautioned that the contrast bolus in the aorta is insufficient to exclude dissection as a potential differential diagnostic consideration from a radiographic standpoint. There is aortic atherosclerosis. Patient is status post coronary artery bypass grafting. 3. Prominence of the main pulmonary outflow tract measuring 3.4 cm, a finding felt to be indicative of a degree of pulmonary arterial hypertension. 4. No lung edema or consolidation. Areas of lower lobe atelectasis. 5.  No evident thoracic adenopathy. Aortic Atherosclerosis (ICD10-I70.0). Electronically Signed   By: Lowella Grip III M.D.   On: 02/13/2018 13:36   Ct Cervical Spine Wo Contrast  Result Date: 02/13/2018 CLINICAL DATA:  Status post fall.  Found on floor.  Weakness. EXAM: CT HEAD WITHOUT CONTRAST CT CERVICAL SPINE WITHOUT CONTRAST TECHNIQUE: Multidetector CT imaging of the head and cervical spine was performed following the standard protocol without intravenous contrast. Multiplanar CT image reconstructions of the cervical spine were also generated. COMPARISON:  4/23/9 FINDINGS: CT HEAD FINDINGS Brain: There is a large asymmetric area of low attenuation involving the right temporal lobe and right occipital lobe which is new from the previous exam. Also new from the previous exam is left frontal lobe cortical and subcortical encephalomalacia compatible with a chronic infarct. There is moderate, progressive diffuse low-attenuation within the subcortical and periventricular white matter compatible with chronic microvascular disease. A chronic appearing lacunar  infarct within the posterior right basal ganglia noted. Moderately progressive atrophy with prominence of the sulci and ventricles noted bilaterally. Vascular: Atherosclerotic calcifications noted. No hyperdense vessel or unexpected calcification noted. Skull: Normal. Negative for fracture or focal lesion. Sinuses/Orbits: No acute finding. Other: None CT CERVICAL SPINE FINDINGS Alignment: Normal. Skull base and vertebrae: No acute fracture. No primary bone lesion or focal pathologic process. Soft tissues and spinal canal: None Disc levels: Multi level degenerative disc disease noted. Most advanced at C4-5, C5-6 and C6-7. Upper chest: Aortic atherosclerosis noted. Other: None IMPRESSION: 1. There is a moderate to large area of low attenuation involving the right temporal lobe and right occipital lobe compatible with an acute or subacute right PCA infarct. No hemorrhage identified and no significant mass effect. 2. Chronic left frontal lobe infarct with progressive global atrophy and chronic small vessel ischemic change. 3. No evidence for cervical spine fracture or dislocation. 4. Cervical spondylosis. 5. Critical Value/emergent results were called by telephone at the time of interpretation on 02/13/2018 at 12:24 pm to [facility] [place] [place], who verbally acknowledged these results. Electronically Signed   By: Kerby Moors M.D.   On: 02/13/2018 12:24   Ct Thoracic Spine Wo Contrast  Result Date: 02/13/2018 CLINICAL DATA:  Back pain with minor trauma. Found on floor. Generalized weakness EXAM: CT THORACIC AND LUMBAR SPINE WITHOUT CONTRAST TECHNIQUE: Multidetector CT imaging of the thoracic and lumbar spine was performed without contrast. Multiplanar CT image reconstructions were also generated. COMPARISON:  None. FINDINGS: CT THORACIC SPINE FINDINGS Alignment: No traumatic malalignment. Mild thoracic dextrocurvature which could be positional. Vertebrae: Negative for fracture, endplate erosion, or  aggressive bone lesion.  Paraspinal and other soft tissues: Intrathoracic findings described on dedicated CT. Small intramuscular lipoma within the right trapezius, right para median. Disc levels: Minor spondylosis.  No visible cord impingement CT LUMBAR SPINE FINDINGS Segmentation: Articulation between the L5 left transverse process and sacrum. Alignment: Normal Vertebrae: Negative for fracture or visible bone lesion. Paraspinal and other soft tissues: No acute finding. Atherosclerosis with 27 mm diameter infrarenal aorta. Colonic diverticulosis. Disc levels: Minor disc bulging.  No impingement IMPRESSION: No acute finding in thoracic or lumbar spine. Electronically Signed   By: Monte Fantasia M.D.   On: 02/13/2018 13:33   Ct Lumbar Spine Wo Contrast  Result Date: 02/13/2018 CLINICAL DATA:  Back pain with minor trauma. Found on floor. Generalized weakness EXAM: CT THORACIC AND LUMBAR SPINE WITHOUT CONTRAST TECHNIQUE: Multidetector CT imaging of the thoracic and lumbar spine was performed without contrast. Multiplanar CT image reconstructions were also generated. COMPARISON:  None. FINDINGS: CT THORACIC SPINE FINDINGS Alignment: No traumatic malalignment. Mild thoracic dextrocurvature which could be positional. Vertebrae: Negative for fracture, endplate erosion, or aggressive bone lesion. Paraspinal and other soft tissues: Intrathoracic findings described on dedicated CT. Small intramuscular lipoma within the right trapezius, right para median. Disc levels: Minor spondylosis.  No visible cord impingement CT LUMBAR SPINE FINDINGS Segmentation: Articulation between the L5 left transverse process and sacrum. Alignment: Normal Vertebrae: Negative for fracture or visible bone lesion. Paraspinal and other soft tissues: No acute finding. Atherosclerosis with 27 mm diameter infrarenal aorta. Colonic diverticulosis. Disc levels: Minor disc bulging.  No impingement IMPRESSION: No acute finding in thoracic or lumbar spine. Electronically Signed    By: Monte Fantasia M.D.   On: 02/13/2018 13:33   Mr Brain Wo Contrast  Result Date: 02/14/2018 CLINICAL DATA:  62 year old female found down yesterday with acute to subacute appearing right PCA territory infarct on noncontrast head CT. CTA head and neck today reveals right PCA P1 occlusion, and also bilateral ICA origin occlusion with reconstituted flow at the carotid termini due to circle-of-Willis collaterals. EXAM: MRI HEAD WITHOUT CONTRAST TECHNIQUE: Multiplanar, multiecho pulse sequences of the brain and surrounding structures were obtained without intravenous contrast. COMPARISON:  CTA head and neck 1046 hours today. Head CT 02/13/2018. Brain MRI 12/05/2007. FINDINGS: Study is intermittently degraded by motion artifact despite repeated imaging attempts. Brain: Confluent restricted diffusion throughout the right PCA territory encompassing a 9-10 centimeter area of the right occipital and temporal lobes (series 5, image 42). Scattered small foci of restricted diffusion also in the right thalamus and junction with the right midbrain. Some of the posterior limb right internal capsule is likely affected. There is a superimposed chronic lacune of the lateral right thalamus. Cytotoxic edema throughout the affected territory with petechial hemorrhage in the right occipital pole. Mild regional mass effect appears stable since yesterday. No other restricted diffusion. Chronic posterior MCA territory encephalomalacia in the posterosuperior frontal and superior parietal lobes. Some chronic hemosiderin is associated. Additional chronic anterior left MCA territory cortical encephalomalacia, also affecting the anterior left corona radiata. No midline shift, mass effect, evidence of mass lesion, ventriculomegaly, extra-axial collection. Cervicomedullary junction and pituitary are within normal limits. Vascular: Proximal posterior circulation vascular flow voids are preserved. Loss of the right ICA flow void is chronic,  but loss of the left ICA flow void in the neck and much of the siphon is new since the 2009 MRI. Evidence of reconstituted bilateral ICA termini flow. Skull and upper cervical spine: Mild cervical spine degeneration. Visualized bone marrow  signal is within normal limits. Sinuses/Orbits: Negative. Other: Mastoid air cells are clear. Scalp and face soft tissues appear negative. IMPRESSION: 1. Large acute right PCA territory infarct concordant with the right PCA occlusion demonstrated by CTA. Cytotoxic edema and petechial hemorrhage. No malignant hemorrhagic transformation or significant mass effect. 2. No other acute intracranial abnormality. 3. 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 Electronically Signed   By: Genevie Ann M.D.   On: 02/14/2018 19:25   Dg Shoulder Left  Result Date: 02/13/2018 CLINICAL DATA:  [address] at home today BILATERAL shoulder pain worse on LEFT than RIGHT EXAM: LEFT SHOULDER - 2+ VIEW COMPARISON:  Rif 12/01/2008 FINDINGS: Osseous demineralization. AC joint alignment normal. No acute fracture, dislocation, or bone destruction. Small calcified ossicle lateral to the acromion appears stable. Visualized LEFT ribs intact. Atherosclerotic calcifications aorta and postsurgical changes of CABG noted. IMPRESSION: No acute LEFT shoulder abnormalities. Electronically Signed   By: Lavonia Dana M.D.   On: 02/13/2018 14:49     LOS: 3 days   Signature  Lala Lund M.D on 02/16/2018 at 10:24 AM  Between 7am to 7pm - Pager - [phone] ( page via [email or web address], text pages only, please mention full 10 digit call back number).  After 7pm go to [email or web address] - password [facility]

## 2018-02-17 DIAGNOSIS — I6300 Cerebral infarction due to thrombosis of unspecified precerebral artery: Secondary | ICD-10-CM

## 2018-02-17 LAB — BASIC METABOLIC PANEL
ANION GAP: 9 (ref 5–15)
BUN: 17 mg/dL (ref 8–23)
CO2: 22 mmol/L (ref 22–32)
CREATININE: 0.95 mg/dL (ref 0.44–1.00)
Calcium: 8.9 mg/dL (ref 8.9–10.3)
Chloride: 108 mmol/L (ref 98–111)
GFR calc non Af Amer: >60 mL/min (ref >60)
Glucose, Bld: 76 mg/dL (ref 70–99)
Potassium: 4.7 mmol/L (ref 3.5–5.1)
SODIUM: 139 mmol/L (ref 135–145)

## 2018-02-17 LAB — MAGNESIUM: MAGNESIUM: 1.7 mg/dL (ref 1.7–2.4)

## 2018-02-17 MED ORDER — AMLODIPINE BESYLATE 10 MG PO TABS: 10.0000 mg | ORAL_TABLET | Freq: Every day | ORAL | 0 refills | Status: DC

## 2018-02-17 MED ORDER — PANTOPRAZOLE SODIUM 40 MG PO TBEC: 40.0000 mg | DELAYED_RELEASE_TABLET | Freq: Every day | ORAL | 0 refills | Status: DC

## 2018-02-17 MED ORDER — ATORVASTATIN CALCIUM 80 MG PO TABS: 80.0000 mg | ORAL_TABLET | Freq: Every day | ORAL | 0 refills | Status: DC

## 2018-02-17 MED ORDER — APIXABAN 5 MG PO TABS: 5.0000 mg | ORAL_TABLET | Freq: Two times a day (BID) | ORAL | 0 refills | Status: DC

## 2018-02-17 NOTE — Discharge Summary (Addendum)
Addendum                                                            [facility]                                                                                                                                                                         Patient Demographics  Kimberly [name], is a 62 y.o. female  WLN:[id number]  DEY:[id number]  DOB - 11-01-1955  Admit date - 02/13/2018  Admitting Physician Janora Norlander, MD  Outpatient Primary MD for the patient is [name]  LOS - 5   Chief Complaint  Patient presents with  . Fall        Assessment & Plan    Patient seen briefly today due for discharge soon per Discharge done yesterday by me, no further issues, Vital signs stable, patient feels fine except continued left-sided weakness.    Time Spent in minutes   10 minutes   Lala Lund M.D on 02/18/2018 at 10:11 AM  Between 7am to 7pm - Pager - [phone]  After 7pm go to [email or web address] - password [email or web address]  And look for the night coverage person covering for me after hours  [facility] Office  [phone]    Subjective:   Kimberly [name] today has, No headache, No chest pain, No abdominal pain - No Nausea, No new weakness tingling or numbness, No Cough - SOB.  Continues to have left-sided weakness and now agreeable to going to SNF.  Objective:   Vitals:   02/17/18 2016 02/17/18 2146 02/17/18 2335 02/18/18 0859  BP: (!) 176/134 (!) 163/92 (!) 139/97 (!) 176/92  Pulse: 80 74 70 68  Resp: 18  18 20   Temp: (!) 97.3 F (36.3 C)  97.6 F (36.4 C) 97.9 F (36.6 C)  TempSrc: Oral  Oral Oral  SpO2: 100% 100% 100% 100%  Weight:      Height:        Wt Readings from Last 3 Encounters:  02/13/18 51.5 kg (113 lb 8.6 oz)  12/14/16 55.8 kg (123 lb)  12/27/15 59.1 kg (130 lb 3.2 oz)     Intake/Output Summary (Last 24 hours) at  02/18/2018 1011 Last data filed at 02/18/2018 0840 Gross per 24 hour  Intake 240 ml  Output 1400 ml  Net -1160 ml    Exam Awake Alert, Oriented X 3, No new F.N deficits, continues to have left-sided weakness, normal affect [place].AT,PERRAL Supple Neck,No JVD, No cervical  lymphadenopathy appriciated.  Symmetrical Chest wall movement, Good air movement bilaterally, CTAB RRR,No Gallops,Rubs or new Murmurs, No Parasternal Heave +ve B.Sounds, Abd Soft, Non tender, No organomegaly appriciated, No rebound - guarding or rigidity. No Cyanosis, Clubbing or edema, No new Rash or bruise                                                                                  DC Note   Kimberly [name] NIO:[id number] DOB: [birth year] DOA: 02/13/2018  PCP: [name]  Admit date: 02/13/2018  Discharge date: 02/17/2018  Admitted From: Home   Disposition: SNF, initially refused SNF but now agreeable.   Recommendations for Outpatient Follow-up:   Follow up with PCP in 1-2 weeks  PCP Please obtain BMP/CBC, 2 view CXR in 1week,  (see Discharge instructions)   PCP Please follow up on the following pending results:    Home Health: PT, RN Equipment/Devices:  Rolling walker, 3 in 1   Consultations: Neuro Discharge Condition: Stable   CODE STATUS: Full Diet Recommendation: Heart Healthy     Chief Complaint  Patient presents with  . Fall     Brief history of present illness from the day of admission and additional interim summary    Patient is a 62 y.o. female with history of CAD status post CABG, prior CVA, dyslipidemia, gout who presented with dizziness, and subsequently sustained a mechanical fall subsequently she started complaining of back pain.  She was brought to the emergency room, where a CT of the head rated a subacute right PCA infarct.  Patient was then admitted to the hospitalist service for further evaluation and treatment.  See below for further details.                                                                  Hospital Course    Subacute right PCA infarct: Probably embolic given A. fib on admission.CT head on admission showed a right subacute PCA infarct.  RI brain confirmed large PCA territory infarct.  Carotid Doppler showed complete occlusion of bilateral internal carotid artery, as a subsequent CT angiogram of the neck confirmed and of bilateral internal carotid arteries at the origins, however appears to have reconstituted flow in the supraclinoid intracranial arteries from collaterals/posterior communicating arteries.  TTE shows preserved EF without any embolic source. LDL 100, A1c 5.8.  EKG on admission showed A. fib with RVR-but now maintaining sinus rhythm.   Due to large size of her stroke which appears to be embolic likely due to paroxysmal atrial fibrillation recommendation by neurology is dual antiplatelet therapy till 02/20/2018 thereafter switch the patient to Eliquis and aspirin and stop Plavix along with high intensity statin which has been started.  Full stroke protocol was followed likely will require rehab admission which she initially refused and was discharged home but on repeat PT evaluation she continued to do poorly and has now agreed to go to SNF.  Note patient has been  quite noncompliant and frequently refusing medications despite counseling, she understands the risks and benefits, she is also refusing SNF hence she will be discharged home with home PT and DME equipment as needed.  If she later changes her mind we will facilitate SNF discharge.  Paroxysmal A. fib with RVR [place] vas 2 score of at least 5: Seems to have reverted back to sinus rhythm-initially was on metoprolol and Cardizem-but due to bradycardia/hypotension controlling medications have been discontinued.    Anticoagulation and aspirin combination as above, defer continuation of aspirin along with Eliquis to neurology and cardiology currently neurology wants both, will also request the patient to  follow with her primary cardiologist within 7 to 10 days of discharge.    Back pain: Probably secondary to fall-likely musculoskeletal etiology-CT of the cervical, thoracic and lumbar spine negative for acute fractures.  Supportive care for now-ambulate with physical therapy services-if continues then we can consider further work-up.  Shoulder pain left: Secondary to the fall that she had on admission-she does have a few abrasions around the left shoulder-x-rays did not show any fracture.  Improved after supportive care no complaints this morning.  Apical variant hypertrophic cardiomyopathy: EKG changes very suggestive of hypertrophic cardiomyopathy-confirmed on echocardiogram.  Previously on beta-blocker-but stopped due to development of bradycardia.  She has no history of syncope or sudden death in her family.    Currently placed on ARB and Norvasc combination, follow with primary cardiologist Dr. Warren Lacy within 1 to 2 weeks of discharge.  Hypertension:  Upon discharge Norvasc and ARB prescribed, now 4 days out of acute event and blood pressure can be addressed, again patient has been frequently noncompliant with medications counseled on compliance.  Outpatient follow-up with PCP and cardiology to monitor blood pressure and heart rate closely.   Dyslipidemia: Previously on Crestor 10 mg-been switched to 80 mg of Lipitor due to CVA and to better control LDL.    Gout: No evidence of flare-continue with colchicine.  Ascending aortic aneurysm: Stable for outpatient follow-up with her primary outpatient MD.  Delirium(metabolic encephalopathy).    Completely resolved back to baseline.     Discharge diagnosis     Principal Problem:   CVA (cerebral vascular accident) ([place]) Active Problems:   Dyslipidemia   Essential hypertension   CAD, ARTERY BYPASS GRAFT   Cerebral artery occlusion with cerebral infarction (HCC)   Shoulder pain, left   Atrial fibrillation with RVR (HCC)    Positive D dimer   Back pain   Paroxysmal atrial fibrillation (HCC)   Coronary artery disease involving native coronary artery of native heart without angina pectoris   Apical variant hypertrophic cardiomyopathy (HCC)   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HCC)   Aortic atherosclerosis ([place])   Carotid occlusion, bilateral   Malnutrition of moderate degree    Discharge instructions    Discharge Instructions    Ambulatory referral to Neurology   Complete by:  As directed    Follow up with stroke clinic NP (Jessica Vanschaick or Cecille Rubin, if both not available, consider Dr. Antony Contras, Dr. Bess Harvest, or Dr. Sarina Ill) at [facility] in about 4 weeks. - Anticipate d/c to SNF   Diet - low sodium heart healthy   Complete by:  As directed    Discharge instructions   Complete by:  As directed    You must take both aspirin and Plavix until 02/19/2018, on 02/20/2018 start Eliquis, continue aspirin and stop Plavix.  Follow with Primary MD [name] in 7 days  Get CBC, CMP,  checked  by Primary MD or SNF MD in 5-7 days    Activity: As tolerated with Full fall precautions use walker/cane & assistance as needed  Disposition Home    Diet:  Heart Healthy   For Heart failure patients - Check your Weight same time everyday, if you gain over 2 pounds, or you develop in leg swelling, experience more shortness of breath or chest pain, call your Primary MD immediately. Follow Cardiac Low Salt Diet and 1.5 lit/day fluid restriction.  Special Instructions: If you have smoked or chewed Tobacco  in the last 2 yrs please stop smoking, stop any regular Alcohol  and or any Recreational drug use.  On your next visit with your primary care physician please Get Medicines reviewed and adjusted.  Please request your Prim.MD to go over all Hospital Tests and Procedure/Radiological results at the follow up, please get all Hospital records sent to your Prim MD by signing hospital  release before you go home.  If you experience worsening of your admission symptoms, develop shortness of breath, life threatening emergency, suicidal or homicidal thoughts you must seek medical attention immediately by calling 911 or calling your MD immediately  if symptoms less severe.  You Must read complete instructions/literature along with all the possible adverse reactions/side effects for all the Medicines you take and that have been prescribed to you. Take any new Medicines after you have completely understood and accpet all the possible adverse reactions/side effects.   Do not drive, operate heavy machinery, perform activities at heights, swimming or participation in water activities or provide baby sitting services if your were admitted for syncope or siezures until you have seen by Primary MD or a Neurologist and advised to do so again.  Do not drive when taking Pain medications.    Do not take more than prescribed Pain, Sleep and Anxiety Medications  Wear Seat belts while driving.   Please note  You were cared for by a hospitalist during your hospital stay. If you have any questions about your discharge medications or the care you received while you were in the hospital after you are discharged, you can call the unit and asked to speak with the hospitalist on call if the hospitalist that took care of you is not available. Once you are discharged, your primary care physician will handle any further medical issues. Please note that NO REFILLS for any discharge medications will be authorized once you are discharged, as it is imperative that you return to your primary care physician (or establish a relationship with a primary care physician if you do not have one) for your aftercare needs so that they can reassess your need for medications and monitor your lab values.   Increase activity slowly   Complete by:  As directed       Discharge Medications   Allergies as of 02/17/2018       Reactions   Clonidine Derivatives Other (See Comments)   Patch causes scars   Spironolactone Rash      Medication List    STOP taking these medications   cloNIDine 0.1 MG tablet Commonly known as:  CATAPRES   ibuprofen 600 MG tablet Commonly known as:  ADVIL,MOTRIN   indomethacin 50 MG capsule Commonly known as:  INDOCIN   metoprolol succinate 100 MG 24 hr tablet Commonly known as:  TOPROL-XL   rosuvastatin 10 MG tablet Commonly known as:  CRESTOR   verapamil 240 MG CR tablet Commonly known  as:  CALAN-SR     TAKE these medications   amLODipine 10 MG tablet Commonly known as:  NORVASC Take 1 tablet (10 mg total) by mouth daily.   apixaban 5 MG Tabs tablet Commonly known as:  ELIQUIS Take 1 tablet (5 mg total) by mouth 2 (two) times daily. Start on 02/20/2018, stop Plavix when you start this medication. Start taking on:  02/20/2018   aspirin 81 MG tablet Take 81 mg by mouth daily.   atorvastatin 80 MG tablet Commonly known as:  LIPITOR Take 1 tablet (80 mg total) by mouth daily at 6 PM.   B-D ASSURE BPM/AUTO ARM CUFF Misc 1 Device by Does not apply route once.   clopidogrel 75 MG tablet Commonly known as:  PLAVIX Take 1 tablet (75 mg total) by mouth daily.   colchicine 0.6 MG tablet Take 1 tablet (0.6 mg total) by mouth 2 (two) times daily.   feeding supplement (ENSURE COMPLETE) Liqd Take 237 mLs by mouth 2 (two) times daily between meals.   multivitamin with minerals tablet Take 1 tablet by mouth daily.   pantoprazole 40 MG tablet Commonly known as:  PROTONIX Take 1 tablet (40 mg total) by mouth daily.   quinapril 40 MG tablet Commonly known as:  ACCUPRIL Take 1 tablet (40 mg total) by mouth daily. MUST KEEP OV FOR ADDITIONAL REFILLS            Durable Medical Equipment  (From admission, onward)        Start     Ordered   02/17/18 1107  For home use only DME 4 wheeled rolling walker with seat  (Walkers)  Once    Question:  Patient needs a  walker to treat with the following condition  Answer:  CVA (cerebral vascular accident) ([place])   02/17/18 1106   02/17/18 1107  DME 3-in-1  Once     02/17/18 1106      Follow-up Information    [facility] Follow up in 4 week(s).   Specialty:  Neurology Why:  stroke clinic. office will call with appt date and time. Contact information: [address] [place] [phone]       [name]. Schedule an appointment as soon as possible for a visit in 1 week(s).   Specialty:  General Practice Contact information: [address] [place] [address] [phone]        [name], MD .   Specialty:  Cardiology Contact information: [address] [place] [address] [phone]           Major procedures and Radiology Reports - PLEASE review detailed and final reports thoroughly  -     MRI - IMPRESSION: 1. Large acute right PCA territory infarct concordant with the right PCA occlusion demonstrated by CTA. Cytotoxic edema and petechial hemorrhage. No malignant hemorrhagic transformation or significant mass effect. 2. No other acute intracranial abnormality. 3. 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  CTA   IMPRESSION: Occlusion of both internal carotid arteries at their origins. 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 Left carotid occlusion is newly seen since the previous study of 2009. Reconstituted flow in the supraclinoid internal carotid arteries due to patent posterior communicating arteries and external to internal collaterals. Atherosclerotic irregularity of the anterior and middle cerebral branches, but with flow present. No vertebral stenosis. Atherosclerotic irregularity of the  basilar but without flow limiting stenosis. Occlusion of the right PCA just beyond its origin, with  more distal flow probably deriving from collateral vessels.   Carotid Doppler Unable to visualize flow in bilateral internal carotid arteries by color, power, and pulsed wave Doppler, therefore findings are suggestive of bilateral internal carotid artery occlusion.Left proximal internal carotid artery exhibits echogenic material, with an anechoic lumen distal to this area. This is suggestive of possible thrombus/embolic material. Bilateral external carotid arteries are patent with low-resistant flow. Bilateral vertebral arteries are patent with low-resistant flow.   TTE  - Left ventricle: The cavity size was normal. Wall thickness wasincreased in a pattern of moderate LVH. Systolic function wasnormal. The estimated ejection fraction was in the range of 60%to 65%. There is hypokinesis of the basalinferolateral andinferior myocardium. Indeterminate diastolic function. - Aortic valve: Mildly calcified annulus. Trileaflet. - Aorta: Ascending aortic diameter: 44 mm (S). - Mitral valve: Mildly thickened leaflets. There was mildregurgitation. - Left atrium: The atrium was mildly to moderately dilated. - Right atrium: The atrium was mildly dilated. Central venouspressure (est): 3 mm Hg. - Atrial septum: No defect or patent foramen ovale was identified. - Tricuspid valve: There was mild regurgitation. - Pulmonary arteries: PA peak pressure: 31 mm Hg (S). - Pericardium, extracardiac: There was no pericardial effusion.   TCD A very technically limited and difficult study due to continuous patient vocalization and inability to stay still. unable to insonate posterior circulation bilaterally via transoccpital window, right anterior circulation via transtemporal window as well  as limited view of right transorbital window. Mean flow velocities were decreased in left MCA, ACA, terminal ICA, PCA indicating proximal left carotid occlusion or severe stenosis. Flow reversal at left opthalmic artery  consistent with left carotid systme high grade stenosis or occlusion. Right ACA flow reveral indicates left to right intracranial collateralization, suggesting right carotid system high grade stenosis or occlusion. Bi-directional left ICA siphon and antegrade flow at right ICA siphon may be explained by posterior to anterior collateralization from bilateral PCOMs. cerebral vascular imaging studies such as CTA or MRA head, or catheter-based cerebral angiography for further evaluation are highly recomended.         Ct Angio Head W Or Wo Contrast  Result Date: 02/14/2018 CLINICAL DATA:  Followup stroke. EXAM: CT ANGIOGRAPHY HEAD AND NECK TECHNIQUE: Multidetector CT imaging of the head and neck was performed using the standard protocol during bolus administration of intravenous contrast. Multiplanar CT image reconstructions and MIPs were obtained to evaluate the vascular anatomy. Carotid stenosis measurements (when applicable) are obtained utilizing NASCET criteria, using the distal internal carotid diameter as the denominator. CONTRAST:  56mL ISOVUE-370 IOPAMIDOL (ISOVUE-370) INJECTION 76% COMPARISON:  CT done yesterday.  MRI 12/05/2007. FINDINGS: CTA NECK FINDINGS Aortic arch: Aortic atherosclerosis. Aneurysmal dilatation of the ascending aorta with diameter of 4.4 cm. Branching pattern of the brachiocephalic vessels is normal without origin stenosis. Right carotid system: Common carotid artery is patent to the bifurcation. Complex calcified and soft plaque at the carotid bifurcation with occlusion of the internal carotid artery. The external carotid is widely patent. Left carotid system: Common carotid artery shows soft plaque with stenosis of 40% throughout its course to the bifurcation. At the carotid bifurcation, there is soft and calcified plaque with occlusion of the internal carotid artery. Vertebral arteries: Right vertebral artery is dominant. Both vertebral artery origins are widely patent. Both  vertebral arteries show scattered atherosclerotic plaque but no flow limiting stenosis. Both vertebral arteries are patent through the cervical  region to the foramen magnum. Skeleton: Ordinary mid cervical spondylosis. Other neck: No mass or lymphadenopathy.  Thyroid nodules. Upper chest: Mild pulmonary scarring. Review of the MIP images confirms the above findings CTA HEAD FINDINGS Anterior circulation: No antegrade flow in the internal carotid arteries through the skull base or siphon regions. Reconstituted flow in the supraclinoid internal carotid arteries, probably secondary to external to internal collaterals and patent posterior communicating arteries. The anterior and middle cerebral vessels show flow without correctable proximal stenosis or large missing branch. Distal vessels show atherosclerotic irregularity. Posterior circulation: Both vertebral arteries are patent through the foramen magnum to the basilar. No basilar stenosis. There is some atherosclerotic irregularity of the basilar artery. Both posterior cerebral arteries show flow at their origins, but there is occlusion of the right PCA just beyond its origin. Some collateral flow is present distal to that, but it appears diminished. Venous sinuses: Patent and normal. Anatomic variants: None significant Delayed phase: No abnormal enhancement. 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 Left carotid occlusion is newly seen since the previous study of 2009. Reconstituted flow in the supraclinoid internal carotid arteries due to patent posterior communicating arteries and external to internal collaterals. Atherosclerotic irregularity of the anterior and middle cerebral branches, but with flow present. No vertebral stenosis. Atherosclerotic irregularity of the basilar but without flow limiting stenosis. Occlusion of the right PCA just  beyond its origin, with more distal flow probably deriving from collateral vessels. Electronically Signed   By: Nelson Chimes M.D.   On: 02/14/2018 11:28   Dg Shoulder Right  Result Date: 02/13/2018 CLINICAL DATA:  [place] at home today BILATERAL shoulder pain worse on LEFT than RIGHT EXAM: RIGHT SHOULDER - 2+ VIEW COMPARISON:  None FINDINGS: Osseous mineralization diffusely decreased. AC joint alignment normal. No acute fracture, dislocation, or bone destruction. Visualized RIGHT ribs intact. IMPRESSION: No acute osseous abnormalities. Electronically Signed   By: Lavonia Dana M.D.   On: 02/13/2018 14:50   Ct Head Wo Contrast  Result Date: 02/13/2018 CLINICAL DATA:  Status post fall.  Found on floor.  Weakness. EXAM: CT HEAD WITHOUT CONTRAST CT CERVICAL SPINE WITHOUT CONTRAST TECHNIQUE: Multidetector CT imaging of the head and cervical spine was performed following the standard protocol without intravenous contrast. Multiplanar CT image reconstructions of the cervical spine were also generated. COMPARISON:  4/23/9 FINDINGS: CT HEAD FINDINGS Brain: There is a large asymmetric area of low attenuation involving the right temporal lobe and right occipital lobe which is new from the previous exam. Also new from the previous exam is left frontal lobe cortical and subcortical encephalomalacia compatible with a chronic infarct. There is moderate, progressive diffuse low-attenuation within the subcortical and periventricular white matter compatible with chronic microvascular disease. A chronic appearing lacunar infarct within the posterior right basal ganglia noted. Moderately progressive atrophy with prominence of the sulci and ventricles noted bilaterally. Vascular: Atherosclerotic calcifications noted. No hyperdense vessel or unexpected calcification noted. Skull: Normal. Negative for fracture or focal lesion. Sinuses/Orbits: No acute finding. Other: None CT CERVICAL SPINE FINDINGS Alignment: Normal. Skull base and  vertebrae: No acute fracture. No primary bone lesion or focal pathologic process. Soft tissues and spinal canal: None Disc levels: Multi level degenerative disc disease noted. Most advanced at C4-5, C5-6 and C6-7. Upper chest: Aortic atherosclerosis noted. Other: None IMPRESSION: 1. There is a moderate to large area of low attenuation involving the right temporal  lobe and right occipital lobe compatible with an acute or subacute right PCA infarct. No hemorrhage identified and no significant mass effect. 2. Chronic left frontal lobe infarct with progressive global atrophy and chronic small vessel ischemic change. 3. No evidence for cervical spine fracture or dislocation. 4. Cervical spondylosis. 5. Critical Value/emergent results were called by telephone at the time of interpretation on 02/13/2018 at 12:24 pm to [facility], who verbally acknowledged these results. Electronically Signed   By: Kerby Moors M.D.   On: 02/13/2018 12:24   Ct Angio Neck W Or Wo Contrast  Result Date: 02/14/2018 CLINICAL DATA:  Followup stroke. EXAM: CT ANGIOGRAPHY HEAD AND NECK TECHNIQUE: Multidetector CT imaging of the head and neck was performed using the standard protocol during bolus administration of intravenous contrast. Multiplanar CT image reconstructions and MIPs were obtained to evaluate the vascular anatomy. Carotid stenosis measurements (when applicable) are obtained utilizing NASCET criteria, using the distal internal carotid diameter as the denominator. CONTRAST:  39mL ISOVUE-370 IOPAMIDOL (ISOVUE-370) INJECTION 76% COMPARISON:  CT done yesterday.  MRI 12/05/2007. FINDINGS: CTA NECK FINDINGS Aortic arch: Aortic atherosclerosis. Aneurysmal dilatation of the ascending aorta with diameter of 4.4 cm. Branching pattern of the brachiocephalic vessels is normal without origin stenosis. Right carotid system: Common carotid artery is patent to the bifurcation. Complex calcified and soft plaque at the carotid bifurcation with  occlusion of the internal carotid artery. The external carotid is widely patent. Left carotid system: Common carotid artery shows soft plaque with stenosis of 40% throughout its course to the bifurcation. At the carotid bifurcation, there is soft and calcified plaque with occlusion of the internal carotid artery. Vertebral arteries: Right vertebral artery is dominant. Both vertebral artery origins are widely patent. Both vertebral arteries show scattered atherosclerotic plaque but no flow limiting stenosis. Both vertebral arteries are patent through the cervical region to the foramen magnum. Skeleton: Ordinary mid cervical spondylosis. Other neck: No mass or lymphadenopathy.  Thyroid nodules. Upper chest: Mild pulmonary scarring. Review of the MIP images confirms the above findings CTA HEAD FINDINGS Anterior circulation: No antegrade flow in the internal carotid arteries through the skull base or siphon regions. Reconstituted flow in the supraclinoid internal carotid arteries, probably secondary to external to internal collaterals and patent posterior communicating arteries. The anterior and middle cerebral vessels show flow without correctable proximal stenosis or large missing branch. Distal vessels show atherosclerotic irregularity. Posterior circulation: Both vertebral arteries are patent through the foramen magnum to the basilar. No basilar stenosis. There is some atherosclerotic irregularity of the basilar artery. Both posterior cerebral arteries show flow at their origins, but there is occlusion of the right PCA just beyond its origin. Some collateral flow is present distal to that, but it appears diminished. Venous sinuses: Patent and normal. Anatomic variants: None significant Delayed phase: No abnormal enhancement. 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 Left carotid occlusion is newly  seen since the previous study of 2009. Reconstituted flow in the supraclinoid internal carotid arteries due to patent posterior communicating arteries and external to internal collaterals. Atherosclerotic irregularity of the anterior and middle cerebral branches, but with flow present. No vertebral stenosis. Atherosclerotic irregularity of the basilar but without flow limiting stenosis. Occlusion of the right PCA just beyond its origin, with more distal flow probably deriving from collateral vessels. Electronically Signed   By: Nelson Chimes M.D.   On: 02/14/2018 11:28  Ct Angio Chest Pe W And/or Wo Contrast  Result Date: 02/13/2018 CLINICAL DATA:  Chest pain and weakness EXAM: CT ANGIOGRAPHY CHEST WITH CONTRAST TECHNIQUE: Multidetector CT imaging of the chest was performed using the standard protocol during bolus administration of intravenous contrast. Multiplanar CT image reconstructions and MIPs were obtained to evaluate the vascular anatomy. CONTRAST:  135mL ISOVUE-370 IOPAMIDOL (ISOVUE-370) INJECTION 76% COMPARISON:  Chest radiograph August 24, 2010 FINDINGS: Cardiovascular: There is no demonstrable pulmonary embolus. The ascending thoracic aorta has a maximum transverse diameter of 4.3 x 4.3 cm. The measured diameter at the aortic arch is 3.6 cm. No dissection is evident. It must be cautioned that the contrast bolus in the aorta is not sufficient to exclude the possibility of dissection is a differential consideration. There is aortic atherosclerosis. There are a few scattered foci of calcification in visualized great vessels. There are foci of native coronary artery calcification evident. Patient is status post coronary artery bypass grafting. There is no pericardial effusion or pericardial thickening. The main pulmonary outflow tract measures 3.4 cm, enlarged. Mediastinum/Nodes: Visualized thyroid appears normal. There is no appreciable thoracic adenopathy. No esophageal lesions are evident.  Lungs/Pleura: There is mild lower lobe atelectasis bilaterally. There is no lung edema or consolidation. No pleural effusion or pleural thickening evident. Upper Abdomen: In the visualized upper abdomen, there is atherosclerotic calcification in the aorta. There also scattered renal artery calcifications. Visualized upper abdominal structures otherwise appear unremarkable. Musculoskeletal: Patient is status post median sternotomy. No blastic or lytic bone lesions. No chest wall lesions evident. Review of the MIP images confirms the above findings. IMPRESSION: 1.  No demonstrable pulmonary embolus. 2. Prominence of the ascending thoracic aorta measuring 4.3 x 4.3 cm. Prominence at the arch level measuring 3.6 cm in diameter. Recommend annual imaging followup by CTA or MRA. This recommendation follows 2010 ACCF/AHA/AATS/ACR/ASA/SCA/SCAI/SIR/STS/SVM Guidelines for the Diagnosis and Management of Patients with Thoracic Aortic Disease. Circulation.2010; 121: Y403-K742. No dissection evident. It must be cautioned that the contrast bolus in the aorta is insufficient to exclude dissection as a potential differential diagnostic consideration from a radiographic standpoint. There is aortic atherosclerosis. Patient is status post coronary artery bypass grafting. 3. Prominence of the main pulmonary outflow tract measuring 3.4 cm, a finding felt to be indicative of a degree of pulmonary arterial hypertension. 4. No lung edema or consolidation. Areas of lower lobe atelectasis. 5.  No evident thoracic adenopathy. Aortic Atherosclerosis (ICD10-I70.0). Electronically Signed   By: Lowella Grip III M.D.   On: 02/13/2018 13:36   Ct Cervical Spine Wo Contrast  Result Date: 02/13/2018 CLINICAL DATA:  Status post fall.  Found on floor.  Weakness. EXAM: CT HEAD WITHOUT CONTRAST CT CERVICAL SPINE WITHOUT CONTRAST TECHNIQUE: Multidetector CT imaging of the head and cervical spine was performed following the standard protocol without  intravenous contrast. Multiplanar CT image reconstructions of the cervical spine were also generated. COMPARISON:  4/23/9 FINDINGS: CT HEAD FINDINGS Brain: There is a large asymmetric area of low attenuation involving the right temporal lobe and right occipital lobe which is new from the previous exam. Also new from the previous exam is left frontal lobe cortical and subcortical encephalomalacia compatible with a chronic infarct. There is moderate, progressive diffuse low-attenuation within the subcortical and periventricular white matter compatible with chronic microvascular disease. A chronic appearing lacunar infarct within the posterior right basal ganglia noted. Moderately progressive atrophy with prominence of the sulci and ventricles noted bilaterally. Vascular: Atherosclerotic calcifications noted. No hyperdense vessel or unexpected calcification  noted. Skull: Normal. Negative for fracture or focal lesion. Sinuses/Orbits: No acute finding. Other: None CT CERVICAL SPINE FINDINGS Alignment: Normal. Skull base and vertebrae: No acute fracture. No primary bone lesion or focal pathologic process. Soft tissues and spinal canal: None Disc levels: Multi level degenerative disc disease noted. Most advanced at C4-5, C5-6 and C6-7. Upper chest: Aortic atherosclerosis noted. Other: None IMPRESSION: 1. There is a moderate to large area of low attenuation involving the right temporal lobe and right occipital lobe compatible with an acute or subacute right PCA infarct. No hemorrhage identified and no significant mass effect. 2. Chronic left frontal lobe infarct with progressive global atrophy and chronic small vessel ischemic change. 3. No evidence for cervical spine fracture or dislocation. 4. Cervical spondylosis. 5. Critical Value/emergent results were called by telephone at the time of interpretation on 02/13/2018 at 12:24 pm to [facility], who verbally acknowledged these results. Electronically Signed   By: Kerby Moors M.D.   On: 02/13/2018 12:24   Ct Thoracic Spine Wo Contrast  Result Date: 02/13/2018 CLINICAL DATA:  Back pain with minor trauma. Found on floor. Generalized weakness EXAM: CT THORACIC AND LUMBAR SPINE WITHOUT CONTRAST TECHNIQUE: Multidetector CT imaging of the thoracic and lumbar spine was performed without contrast. Multiplanar CT image reconstructions were also generated. COMPARISON:  None. FINDINGS: CT THORACIC SPINE FINDINGS Alignment: No traumatic malalignment. Mild thoracic dextrocurvature which could be positional. Vertebrae: Negative for fracture, endplate erosion, or aggressive bone lesion. Paraspinal and other soft tissues: Intrathoracic findings described on dedicated CT. Small intramuscular lipoma within the right trapezius, right para median. Disc levels: Minor spondylosis.  No visible cord impingement CT LUMBAR SPINE FINDINGS Segmentation: Articulation between the L5 left transverse process and sacrum. Alignment: Normal Vertebrae: Negative for fracture or visible bone lesion. Paraspinal and other soft tissues: No acute finding. Atherosclerosis with 27 mm diameter infrarenal aorta. Colonic diverticulosis. Disc levels: Minor disc bulging.  No impingement IMPRESSION: No acute finding in thoracic or lumbar spine. Electronically Signed   By: Monte Fantasia M.D.   On: 02/13/2018 13:33   Ct Lumbar Spine Wo Contrast  Result Date: 02/13/2018 CLINICAL DATA:  Back pain with minor trauma. Found on floor. Generalized weakness EXAM: CT THORACIC AND LUMBAR SPINE WITHOUT CONTRAST TECHNIQUE: Multidetector CT imaging of the thoracic and lumbar spine was performed without contrast. Multiplanar CT image reconstructions were also generated. COMPARISON:  None. FINDINGS: CT THORACIC SPINE FINDINGS Alignment: No traumatic malalignment. Mild thoracic dextrocurvature which could be positional. Vertebrae: Negative for fracture, endplate erosion, or aggressive bone lesion. Paraspinal and other soft tissues:  Intrathoracic findings described on dedicated CT. Small intramuscular lipoma within the right trapezius, right para median. Disc levels: Minor spondylosis.  No visible cord impingement CT LUMBAR SPINE FINDINGS Segmentation: Articulation between the L5 left transverse process and sacrum. Alignment: Normal Vertebrae: Negative for fracture or visible bone lesion. Paraspinal and other soft tissues: No acute finding. Atherosclerosis with 27 mm diameter infrarenal aorta. Colonic diverticulosis. Disc levels: Minor disc bulging.  No impingement IMPRESSION: No acute finding in thoracic or lumbar spine. Electronically Signed   By: Monte Fantasia M.D.   On: 02/13/2018 13:33   Mr Brain Wo Contrast  Result Date: 02/14/2018 CLINICAL DATA:  62 year old female found down yesterday with acute to subacute appearing right PCA territory infarct on noncontrast head CT. CTA head and neck today reveals right PCA P1 occlusion, and also bilateral ICA origin occlusion with reconstituted flow at the carotid termini due to circle-of-Willis collaterals. EXAM:  MRI HEAD WITHOUT CONTRAST TECHNIQUE: Multiplanar, multiecho pulse sequences of the brain and surrounding structures were obtained without intravenous contrast. COMPARISON:  CTA head and neck 1046 hours today. Head CT 02/13/2018. Brain MRI 12/05/2007. FINDINGS: Study is intermittently degraded by motion artifact despite repeated imaging attempts. Brain: Confluent restricted diffusion throughout the right PCA territory encompassing a 9-10 centimeter area of the right occipital and temporal lobes (series 5, image 42). Scattered small foci of restricted diffusion also in the right thalamus and junction with the right midbrain. Some of the posterior limb right internal capsule is likely affected. There is a superimposed chronic lacune of the lateral right thalamus. Cytotoxic edema throughout the affected territory with petechial hemorrhage in the right occipital pole. Mild regional mass  effect appears stable since yesterday. No other restricted diffusion. Chronic posterior MCA territory encephalomalacia in the posterosuperior frontal and superior parietal lobes. Some chronic hemosiderin is associated. Additional chronic anterior left MCA territory cortical encephalomalacia, also affecting the anterior left corona radiata. No midline shift, mass effect, evidence of mass lesion, ventriculomegaly, extra-axial collection. Cervicomedullary junction and pituitary are within normal limits. Vascular: Proximal posterior circulation vascular flow voids are preserved. Loss of the right ICA flow void is chronic, but loss of the left ICA flow void in the neck and much of the siphon is new since the 2009 MRI. Evidence of reconstituted bilateral ICA termini flow. Skull and upper cervical spine: Mild cervical spine degeneration. Visualized bone marrow signal is within normal limits. Sinuses/Orbits: Negative. Other: Mastoid air cells are clear. Scalp and face soft tissues appear negative. IMPRESSION: 1. Large acute right PCA territory infarct concordant with the right PCA occlusion demonstrated by CTA. Cytotoxic edema and petechial hemorrhage. No malignant hemorrhagic transformation or significant mass effect. 2. No other acute intracranial abnormality. 3. 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 Electronically Signed   By: Genevie Ann M.D.   On: 02/14/2018 19:25   Dg Shoulder Left  Result Date: 02/13/2018 CLINICAL DATA:  [place] at home today BILATERAL shoulder pain worse on LEFT than RIGHT EXAM: LEFT SHOULDER - 2+ VIEW COMPARISON:  Rif 12/01/2008 FINDINGS: Osseous demineralization. AC joint alignment normal. No acute fracture, dislocation, or bone destruction. Small calcified ossicle lateral to the acromion appears stable. Visualized LEFT ribs intact. Atherosclerotic calcifications aorta and postsurgical  changes of CABG noted. IMPRESSION: No acute LEFT shoulder abnormalities. Electronically Signed   By: Lavonia Dana M.D.   On: 02/13/2018 14:49    Micro Results     No results found for this or any previous visit (from the past 240 hour(s)).  Today   Subjective    Clarence Cipollone today has no headache,no chest abdominal pain,no new weakness tingling or numbness, feels much better wants to go home today initially refused SNF but later in the afternoon agreed to go to SNF.    Objective   Blood pressure (!) 130/102, pulse 63, temperature (!) 97.5 F (36.4 C), temperature source Axillary, resp. rate 20, height 5\' 6"  (1.676 m), weight 51.5 kg (113 lb 8.6 oz), SpO2 100 %.   Intake/Output Summary (Last 24 hours) at 02/17/2018 1122 Last data filed at 02/17/2018 0600 Gross per 24 hour  Intake 420 ml  Output 1200 ml  Net -780 ml    Exam Awake Alert, Oriented x 3, No new F.N deficits, appears to have left arm weakness and generalized weakness all over, normal affect [place].AT,PERRAL Supple Neck,No  JVD, No cervical lymphadenopathy appriciated.  Symmetrical Chest wall movement, Good air movement bilaterally, CTAB RRR,No Gallops,Rubs or new Murmurs, No Parasternal Heave +ve B.Sounds, Abd Soft, Non tender, No organomegaly appriciated, No rebound -guarding or rigidity. No Cyanosis, Clubbing or edema, No new Rash or bruise   Data Review   CBC w Diff:  Lab Results  Component Value Date   WBC 7.7 02/14/2018   HGB 11.1 (L) 02/14/2018   HCT 35.8 (L) 02/14/2018   PLT 171 02/14/2018   LYMPHOPCT 25 02/13/2018   MONOPCT 6 02/13/2018   EOSPCT 0 02/13/2018   BASOPCT 1 02/13/2018    CMP:  Lab Results  Component Value Date   NA 139 02/17/2018   K 4.7 02/17/2018   CL 108 02/17/2018   CO2 22 02/17/2018   BUN 17 02/17/2018   CREATININE 0.95 02/17/2018   CREATININE 0.98 05/04/2014   PROT 6.9 02/13/2018   ALBUMIN 4.0 02/13/2018   BILITOT 1.0 02/13/2018   ALKPHOS 81 02/13/2018   AST 35  02/13/2018   ALT 27 02/13/2018  . Lab Results  Component Value Date   HGBA1C 5.8 (H) 02/14/2018   Lab Results  Component Value Date   CHOL 160 02/14/2018   HDL 44 02/14/2018   LDLCALC 100 (H) 02/14/2018   TRIG 78 02/14/2018   CHOLHDL 3.6 02/14/2018     Total Time in preparing paper work, data evaluation and todays exam - 72 minutes  Lala Lund M.D on 02/17/2018 at [place]  [phone]

## 2018-02-17 NOTE — Progress Notes (Signed)
Physical Therapy Treatment Patient Details Name: Kimberly [name] MRN: [id number] DOB: 1956/05/16 Today's Date: 02/17/2018    History of Present Illness Kimberly [name] is a 62 y.o. 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 Has L sided deficits at baseline since her prior CVA. She was found on the floor by her daughter, and is now complaining of severe back pain. CT scan showed moderate to large area of low attenuation involving the right temporal lobe and right occipital lobe compatible with an acute or subacute right PCA infarct. CT clear for R & L shoulder cervical, lumbar, and thoracic spine.     PT Comments    Patient continues to present as high risk of falling. Requiring up to maximal assistance to stand and transfer using the walker. Ambulating 2 feet with walker and maximal assistance; unable to clear left foot significantly. Also has decreased functional use of LUE. Patient reports she has no assistance at home for ADL's. Now agreeable to SNF. D/c plan remains appropriate.     Follow Up Recommendations  SNF;Supervision/Assistance - 24 hour     Equipment Recommendations  Other (comment)(TBA)    Recommendations for Other Services       Precautions / Restrictions Precautions Precautions: Fall Restrictions Weight Bearing Restrictions: No    Mobility  Bed Mobility               General bed mobility comments: OOB in recliner  Transfers Overall transfer level: Needs assistance Equipment used: Rolling walker (2 wheeled) Transfers: Sit to/from [facility] Sit to Stand: Mod assist;Max assist         General transfer comment: Max cueing for hand placement. Unable to initiate transfer without assistance. Requiring max assistance to stand from [facility]. When standing in front of BSC as PT was removing brief, patient with uncontrolled eccentric descent onto [facility]    Ambulation/Gait Ambulation/Gait assistance: Max assist Gait Distance (Feet): 2 Feet Assistive device: Rolling walker (2 wheeled) Gait Pattern/deviations: Step-to pattern;Wide base of support;Trunk flexed     General Gait Details: Unable to progress or clear left foot, very poor proximity to walker with trunk completely flexed.    Stairs             Wheelchair Mobility    Modified Rankin (Stroke Patients Only) Modified Rankin (Stroke Patients Only) Pre-Morbid Rankin Score: No symptoms Modified Rankin: Severe disability     Balance Overall balance assessment: Needs assistance Sitting-balance support: Bilateral upper extremity supported;Feet supported Sitting balance-Leahy Scale: Fair     Standing balance support: Bilateral upper extremity supported Standing balance-Leahy Scale: Poor                              Cognition Arousal/Alertness: Awake/alert Behavior During Therapy: WFL for tasks assessed/performed Overall Cognitive Status: Impaired/Different from baseline Area of Impairment: Attention;Following commands;Safety/judgement;Problem solving                   Current Attention Level: Selective   Following Commands: Follows one step commands with increased time Safety/Judgement: Decreased awareness of safety;Decreased awareness of deficits   Problem Solving: Slow processing;Decreased initiation;Difficulty sequencing;Requires verbal cues;Requires tactile cues        Exercises      General Comments        Pertinent Vitals/Pain Pain Assessment: Faces Faces Pain Scale: Hurts little more Pain Location: Generalized Pain Descriptors /  Indicators: Grimacing;Moaning Pain Intervention(s): Monitored during session;Limited activity within patient's tolerance    Home Living                      Prior Function            PT Goals (current goals can now be found in the care plan section) Acute Rehab PT Goals Patient Stated  Goal: "I want you to help my pain"    Frequency    Min 3X/week      PT Plan Current plan remains appropriate    Co-evaluation              AM-PAC PT "6 Clicks" Daily Activity  Outcome Measure  Difficulty turning over in bed (including adjusting bedclothes, sheets and blankets)?: Unable Difficulty moving from lying on back to sitting on the side of the bed? : Unable Difficulty sitting down on and standing up from a chair with arms (e.g., wheelchair, bedside commode, etc,.)?: Unable Help needed moving to and from a bed to chair (including a wheelchair)?: Total Help needed walking in hospital room?: Total Help needed climbing 3-5 steps with a railing? : Total 6 Click Score: 6    End of Session Equipment Utilized During Treatment: Gait belt Activity Tolerance: Patient tolerated treatment well Patient left: in chair;with call bell/phone within reach;with chair alarm set;with nursing/sitter in room Nurse Communication: Mobility status PT Visit Diagnosis: Unsteadiness on feet (R26.81);Other abnormalities of gait and mobility (R26.89);Muscle weakness (generalized) (M62.81)     Time: 1594-7076 PT Time Calculation (min) (ACUTE ONLY): 32 min  Charges:  $Therapeutic Activity: 23-37 mins                    G Codes:       Ellamae Sia, PT, DPT Acute Rehabilitation Services  Pager: [phone]    Willy Eddy 02/17/2018, 1:57 PM

## 2018-02-17 NOTE — Care Management Note (Signed)
Case Management Note  Patient Details  Name: Arleatha Philipps MRN: [id number] Date of Birth: Oct 17, 1955  Subjective/Objective:                 Spoke w patient at bedside, she is addiment about going home. Agreeable to [facility], but "doesn't want anyone to stay w her." Explained HH would be there for an hour a few tmes a week, agreeable to [facility]. Discussed DME. She would like 3/1 and rollator as ordered. They will be delivered to room prior to DC. SHe states her daughter or her sister will be available to pick her up. No other CM needs.     Action/Plan:   Expected Discharge Date:  02/17/18               Expected Discharge Plan:  [place]  In-House Referral:     Discharge planning Services  CM Consult  Post Acute Care Choice:  Durable Medical Equipment, Home Health Choice offered to:  Patient  DME Arranged:  3-N-1, Walker rolling with seat DME Agency:  [place]:  RN, PT [facility] Agency:  [place]  Status of Service:  Completed, signed off  If discussed at [place] of Stay Meetings, dates discussed:    Additional Comments:  Carles Collet, RN 02/17/2018, 11:56 AM

## 2018-02-17 NOTE — Progress Notes (Addendum)
Patient has no bed choices for SNF placement at this time.  CSW will continue to follow.  Reed Breech LCSWA [phone]

## 2018-02-17 NOTE — Progress Notes (Addendum)
Pt d/c home, no new complains, d/c instructions done with teach back, pt verbalize understanding. Pt is transported out of facility by family.  Pt was unable to walk with PT and will not be able to care for self. Pt will be d/c to a rehab facility or SNF on discharge. SW/CM will work on it Monday. D/c orders canceled by MD until bed  placement

## 2018-02-18 MED ORDER — LISINOPRIL 5 MG PO TABS
5.0000 mg | ORAL_TABLET | Freq: Every day | ORAL | Status: DC
  Administered 2018-02-18: 5 mg via ORAL
  Filled 2018-02-18: qty 1

## 2018-02-18 MED ORDER — AMLODIPINE BESYLATE 10 MG PO TABS
10.0000 mg | ORAL_TABLET | Freq: Every day | ORAL | Status: DC
  Administered 2018-02-18: 10 mg via ORAL
  Filled 2018-02-18: qty 1

## 2018-02-18 NOTE — Progress Notes (Signed)
  Speech Language Pathology Treatment: Cognitive-Linquistic  Patient Details Name: Kimberly [name] MRN: [id number] DOB: [birth year] Today's Date: 02/18/2018 Time: [id number] SLP Time Calculation (min) (ACUTE ONLY): 24 min  Assessment / Plan / Recommendation Clinical Impression  Today pt with improved ability to sustain attention to task even with door open.  She did complain of headache during more difficult tasks during [facility] and visual portion unable to be conducted due to vision deficits.  MOCA 7.1 original version given with pt scoring 15/22 - strengths in memory, language and attention.  Difficulty with mental math task and abstract thought.  Pt continues to admit memory deficits since her CVA and states she wants this improved.  Provided her with strategies to compensate for memory and maximize recall ability *(turn off background noise to allow her to focus), mental strategies reviewed as written not available due to visual deficits.  Of note, pt recalled staff member's name during session (approx 10 minutes later) without cues!  Will follow up to help maximize functional cognitive linguistic abilities.     HPI HPI: 62 yo female adm to [facility] with dizziness, fall, speech changes and left sided weakness.  PMH + for CVA 2009, HTN, prior smoker stop 2008, CAD s/p CABG 2008, rapid ventricular response.  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  Pt admits to prior h/o memory deficits.   She lives alone and manages all of her home duties independently.  11th grade education.       SLP Plan    SLP      Recommendations   Follow up at SNF                SLP Visit Diagnosis: Cognitive communication deficit (R41.841)       GO                Macario Golds 02/18/2018, 11:37 AM Luanna Salk, [place] [facility] SLP [phone]

## 2018-02-18 NOTE — Progress Notes (Signed)
CSW following for discharge plan. Patient continues to have no bed offers for SNF. CSW contacted Admissions at [facility]. Facility has reviewed referral, they are unable to offer a bed for the patient. CSW contacted Admissions at [facility], awaiting response on whether they can offer a bed for the patient.  CSW to continue to follow.  Laveda Abbe, [place] Clinical Social Worker [phone]

## 2018-02-18 NOTE — Clinical Social Work Placement (Signed)
Nurse to call report to [phone], Ask for [facility] nurse    CLINICAL SOCIAL WORK PLACEMENT  NOTE  Date:  02/18/2018  Patient Details  Name: [name] MRN: [id number] Date of Birth: 1956-06-01  Clinical Social Work is seeking post-discharge placement for this patient at the [place] level of care (*CSW will initial, date and re-position this form in  chart as items are completed):  Yes   Patient/family provided with [facility] Work Department's list of facilities offering this level of care within the geographic area requested by the patient (or if unable, by the patient's family).  Yes   Patient/family informed of their freedom to choose among providers that offer the needed level of care, that participate in Medicare, Medicaid or managed care program needed by the patient, have an available bed and are willing to accept the patient.  Yes   Patient/family informed of [facility]'s ownership interest in [facility] and [facility], as well as of the fact that they are under no obligation to receive care at these facilities.  PASRR submitted to EDS on 02/16/18     PASRR number received on 02/16/18     Existing PASRR number confirmed on       FL2 transmitted to all facilities in geographic area requested by pt/family on 02/16/18     FL2 transmitted to all facilities within larger geographic area on       Patient informed that his/her managed care company has contracts with or will negotiate with certain facilities, including the following:        Yes   Patient/family informed of bed offers received.  Patient chooses bed at [facility]     Physician recommends and patient chooses bed at      Patient to be transferred to [facility] on 02/18/18.  Patient to be transferred to facility by [facility]     Patient family notified on 02/18/18 of transfer.  Name of family member notified:        PHYSICIAN       Additional Comment:     _______________________________________________ Geralynn Ochs, LCSW 02/18/2018, 3:39 PM

## 2018-02-18 NOTE — Progress Notes (Addendum)
Pt d/c to facility ([facility]). Pt is alert and oriented with no new concerns.pt will be transported from hospital by [facility]. Nurse called facility 4 times to give report. No one will come to the phone. After being transferred 3 times. The third transfer was to the Nursing director but still no answer. The 4th phone call was made and RN spoke to [facility] the receptionist, and told her that the hospital phone number will be left for facility to call [facility] for report.     06:30p  Kandice Robinsons from [facility] called back for report on pt, d/c instructions given.

## 2018-02-18 NOTE — Care Management Note (Signed)
Case Management Note  Patient Details  Name: Kimberly [name] MRN: [id number] Date of Birth: 1956-05-18  Subjective/Objective:                    Action/Plan: Pt is discharging to [facility] today. CM signing off.   Expected Discharge Date:  02/18/18               Expected Discharge Plan:  [place]  In-House Referral:     Discharge planning Services  CM Consult  Post Acute Care Choice:  Durable Medical Equipment Choice offered to:  Patient  DME Arranged:  3-N-1, Walker rolling with seat DME Agency:     HH Arranged:    HH Agency:  [facility]  Status of Service:  Completed, signed off  If discussed at [place] of Stay Meetings, dates discussed:    Additional Comments:  Pollie Friar, RN 02/18/2018, 3:58 PM

## 2018-03-01 ENCOUNTER — Ambulatory Visit: Payer: Self-pay | Admitting: Cardiology
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
## 2018-03-21 ENCOUNTER — Ambulatory Visit: Payer: Self-pay | Admitting: Cardiology

## 2018-03-21 NOTE — Progress Notes (Deleted)
[facility] [address] [place]. [place] [address] [phone]       OFFICE FOLLOW UP NOTE  [name] Date of Birth:  1956-04-08 Medical Record Number:  [id number]   Reason for Referral:  hospital stroke follow up  CHIEF COMPLAINT:  No chief complaint on file.   HPI: Kimberly [name] is being seen today for initial visit in the office for right PCA infarct due to right PCA occlusion secondary to newly diagnosed AF on 02/13/2018. History obtained from *** and chart review. Reviewed all radiology images and labs personally.  [name] is a 62 y.o. female with history of CAD s/p CABG in 2008, stroke in 2009 with right thalamic/IC infarct, chronic right ICA occlusion, HTN, and HLD who was admitted for dizziness, fall and left hemianopia. No tPA given due to OSW.  CTA head and neck showed bilateral ICA occluded at origin, right PCA occlusion, PA irregularity and bilateral MCAs pain from collaterals.  Carotid Doppler showed bilateral ICA occlusion and possible left ICA fresh clot.  MRI showed large right PCA infarct with edema and petechial hemorrhage, bilateral ICA occlusion and old bilateral left greater than right MCA infarcts.  2D echo showed EF of 60 to 65%.  TCD was limited study but showed left ophthalmic A and right ACA flow reversal consistent with bilateral carotid system occlusion.  LDL 100 and recommended start Lipitor 80 mg daily.  HTN stable but recommended BP goal of 130 and 160 given bilateral ICA occlusion.  A1c satisfactory at 5.8.  Patient was found to be in PAF with RVR and recommended start anticoagulation 5 to 7 days post stroke given large size of stroke and avoid hemorrhagic conversion and recommend to stop Plavix once Eliquis is started but recommended to continue aspirin 81 mg due to extensive cerebral and cardiac vasculopathy.  Therapy recommended SNF for continued therapy.     ROS:   14 system review of systems performed and negative  with exception of ***  PMH:  Past Medical History:  Diagnosis Date  . Bicipital tenosynovitis   . Coronary artery disease 05/2007   [id number]   . Fall 02/13/2018  . Family history of ischemic heart disease   . Gout   . Hammer toe   . Hip, thigh, leg, and ankle, insect bite, nonvenomous, without mention of infection(916.4)   . Hyperlipidemia   . Hypertension   . Other abnormality of red blood cells   . Rotator cuff syndrome of left shoulder   . Stroke [facility])     PSH:  Past Surgical History:  Procedure Laterality Date  . CARDIAC CATHETERIZATION    . CORONARY ARTERY BYPASS GRAFT     triple  . CORONARY ARTERY BYPASS GRAFT  2008  . TUBAL LIGATION      Social History:  Social History   Socioeconomic History  . Marital status: Single    Spouse name: Not on file  . Number of children: Not on file  . Years of education: Not on file  . Highest education level: Not on file  Occupational History  . Not on file  Social Needs  . Financial resource strain: Not on file  . Food insecurity:    Worry: Not on file    Inability: Not on file  . Transportation needs:    Medical: Not on file    Non-medical: Not on file  Tobacco Use  . Smoking status: Former Research scientist (life sciences)  . Smokeless tobacco: Former Systems developer  Quit date: 08/09/2008  Substance and Sexual Activity  . Alcohol use: Yes    Alcohol/week: 1.0 standard drinks    Types: 1 Cans of beer per week  . Drug use: Yes    Types: Marijuana    Comment: history of cocaine use  . Sexual activity: Not on file  Lifestyle  . Physical activity:    Days per week: Not on file    Minutes per session: Not on file  . Stress: Not on file  Relationships  . Social connections:    Talks on phone: Not on file    Gets together: Not on file    Attends religious service: Not on file    Active member of club or organization: Not on file    Attends meetings of clubs or organizations: Not on file    Relationship status: Not on file  . Intimate partner  violence:    Fear of current or ex partner: Not on file    Emotionally abused: Not on file    Physically abused: Not on file    Forced sexual activity: Not on file  Other Topics Concern  . Not on file  Social History Narrative  . Not on file    Family History:  Family History  Problem Relation Age of Onset  . Hypertension Mother   . Heart attack Father   . Diabetes Father   . Anxiety disorder Sister   . Sarcoidosis Sister     Medications:   Current Outpatient Medications on File Prior to Visit  Medication Sig Dispense Refill  . amLODipine (NORVASC) 10 MG tablet Take 1 tablet (10 mg total) by mouth daily. 30 tablet 0  . apixaban (ELIQUIS) 5 MG TABS tablet Take 1 tablet (5 mg total) by mouth 2 (two) times daily. Start on 02/20/2018, stop Plavix when you start this medication. 60 tablet 0  . aspirin 81 MG tablet Take 81 mg by mouth daily.      [name] atorvastatin (LIPITOR) 80 MG tablet Take 1 tablet (80 mg total) by mouth daily at 6 PM. 30 tablet 0  . Blood Pressure Monitoring (B-D ASSURE BPM/AUTO ARM CUFF) MISC 1 Device by Does not apply route once. 1 each 0  . clopidogrel (PLAVIX) 75 MG tablet Take 1 tablet (75 mg total) by mouth daily. 90 tablet 2  . colchicine 0.6 MG tablet Take 1 tablet (0.6 mg total) by mouth 2 (two) times daily. 60 tablet 0  . feeding supplement, ENSURE COMPLETE, (ENSURE COMPLETE) LIQD Take 237 mLs by mouth 2 (two) times daily between meals. (Patient not taking: Reported on 02/13/2018) 30 Bottle 5  . Multiple Vitamins-Minerals (MULTIVITAMIN WITH MINERALS) tablet Take 1 tablet by mouth daily.      . pantoprazole (PROTONIX) 40 MG tablet Take 1 tablet (40 mg total) by mouth daily. 30 tablet 0  . quinapril (ACCUPRIL) 40 MG tablet Take 1 tablet (40 mg total) by mouth daily. MUST KEEP OV FOR ADDITIONAL REFILLS 60 tablet 0   No current facility-administered medications on file prior to visit.     Allergies:   Allergies  Allergen Reactions  . Clonidine Derivatives Other  (See Comments)    Patch causes scars  . Spironolactone Rash     Physical Exam  There were no vitals filed for this visit. There is no height or weight on file to calculate BMI. No exam data present  General: well developed, well nourished, seated, in no evident distress Head: head normocephalic and atraumatic.  Neck: supple with no carotid or supraclavicular bruits Cardiovascular: regular rate and rhythm, no murmurs Musculoskeletal: no deformity Skin:  no rash/petichiae Vascular:  Normal pulses all extremities  Neurologic Exam Mental Status: Awake and fully alert. Oriented to place and time. Recent and remote memory intact. Attention span, concentration and fund of knowledge appropriate. Mood and affect appropriate.  No flowsheet data found. Cranial Nerves: Fundoscopic exam reveals sharp disc margins. Pupils equal, briskly reactive to light. Extraocular movements full without nystagmus. Visual fields full to confrontation. Hearing intact. Facial sensation intact. Face, tongue, palate moves normally and symmetrically.  Motor: Normal bulk and tone. Normal strength in all tested extremity muscles. Sensory.: intact to touch , pinprick , position and vibratory sensation.  Coordination: Rapid alternating movements normal in all extremities. Finger-to-nose and heel-to-shin performed accurately bilaterally. Gait and Station: Arises from chair without difficulty. Stance is normal. Gait demonstrates normal stride length and balance . Able to heel, toe and tandem walk without difficulty.  Reflexes: 1+ and symmetric. Toes downgoing.    NIHSS  *** Modified Rankin  ***   Diagnostic Data (Labs, Imaging, Testing)  CT head without contrast 02/13/2018 IMPRESSION: 1. There is a moderate to large area of low attenuation involving the right temporal lobe and right occipital lobe compatible with an acute or subacute right PCA infarct. No hemorrhage identified and no significant mass effect. 2.  Chronic left frontal lobe infarct with progressive global atrophy and chronic small vessel ischemic change. 3. No evidence for cervical spine fracture or dislocation. 4. Cervical spondylosis.  CT angios head with and without contrast CT angios neck with and without contrast 02/14/2018 IMPRESSION: Occlusion of both internal carotid arteries at their origins. 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 Left carotid occlusion is newly seen since the previous study of 2009. Reconstituted flow in the supraclinoid internal carotid arteries due to patent posterior communicating arteries and external to internal collaterals. Atherosclerotic irregularity of the anterior and middle cerebral branches, but with flow present. No vertebral stenosis. Atherosclerotic irregularity of the basilar but without flow limiting stenosis. Occlusion of the right PCA just beyond its origin, with more distal flow probably deriving from collateral vessels.  MR brain without contrast 02/14/2018 IMPRESSION: 1. Large acute right PCA territory infarct concordant with the right PCA occlusion demonstrated by CTA. Cytotoxic edema and petechial hemorrhage. No malignant hemorrhagic transformation or significant mass effect. 2. No other acute intracranial abnormality. 3. 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  Carotid Doppler   Unable to visualize flow in bilateral internal carotid arteries by color, power, and pulsed wave Doppler, therefore findings are suggestive of bilateral internal carotid artery occlusion.Left proximal internal carotid artery exhibits echogenic material, with an anechoic lumen distal to this area. This is suggestive of possible thrombus/embolic material. Bilateral external carotid arteries are patent with low-resistant flow. Bilateral vertebral arteries are patent with low-resistant flow.  TTE    - Left ventricle: The cavity size was normal. Wall thickness wasincreased in a pattern of moderate LVH. Systolic function wasnormal. The estimated ejection fraction was in the range of 60%to 65%. There is hypokinesis of the basalinferolateral andinferior myocardium. Indeterminate diastolic function. - Aortic valve: Mildly calcified annulus. Trileaflet. - Aorta: Ascending aortic diameter: 44 mm (S). - Mitral valve: Mildly thickened leaflets. There was mildregurgitation. - Left atrium: The atrium was mildly to moderately dilated. - Right atrium: The atrium was mildly dilated. Central venouspressure (est): 3 mm Hg. -  Atrial septum: No defect or patent foramen ovale was identified. - Tricuspid valve: There was mild regurgitation. - Pulmonary arteries: PA peak pressure: 31 mm Hg (S). - Pericardium, extracardiac: There was no pericardial effusion.  TCD A very technically limited and difficult study due to continuous patient vocalization and inability to stay still. unable to insonate posterior circulation bilaterally via transoccpital window, right anterior circulation via transtemporal window as well  as limited view of right transorbital window. Mean flow velocities were decreased in left MCA, ACA, terminal ICA, PCA indicating proximal left carotid occlusion or severe stenosis. Flow reversal at left opthalmic artery consistent with left carotid systme high grade stenosis or occlusion. Right ACA flow reveral indicates left to right intracranial collateralization, suggesting right carotid system high grade stenosis or occlusion. Bi-directional left ICA siphon and antegrade flow at right ICA siphon may be explained by posterior to anterior collateralization from bilateral PCOMs. cerebral vascular imaging studies such as CTA or MRA head, or catheter-based cerebral angiography for further evaluation are highly recomended.  ASSESSMENT: Kimberly [name] is a 62 y.o. year old female here with right PCA  infarct on 02/13/2018 secondary to right PCA occlusion secondary to newly diagnosed A. fib. Vascular risk factors include CAD, HTN and HLD.     PLAN: -Continue {anticoagulants:31417}  and ***  for secondary stroke prevention -F/u with PCP regarding your *** management -continue to monitor BP at home  -Maintain strict control of hypertension with blood pressure goal below 130/90, diabetes with hemoglobin A1c goal below 6.5% and cholesterol with LDL cholesterol (bad cholesterol) goal below 70 mg/dL. I also advised the patient to eat a healthy diet with plenty of whole grains, cereals, fruits and vegetables, exercise regularly and maintain ideal body weight.  Follow up in *** or call earlier if needed   Greater than 50% of time during this 25 minute visit was spent on counseling,explanation of diagnosis of ***, reviewing risk factor management of ***, planning of further management, discussion with patient and family and coordination of care    Venancio Poisson, [facility]  [facility] [address] [place], [place] [address]  Phone [phone] Fax [phone]

## 2018-03-22 ENCOUNTER — Telehealth: Payer: Self-pay

## 2018-03-22 ENCOUNTER — Encounter: Payer: Self-pay | Admitting: *Deleted

## 2018-03-22 ENCOUNTER — Ambulatory Visit: Payer: Self-pay | Admitting: Adult Health

## 2018-03-22 ENCOUNTER — Encounter: Payer: Self-pay | Admitting: Adult Health

## 2018-03-22 NOTE — Telephone Encounter (Signed)
Patient no show for appt today. 

## 2018-03-24 ENCOUNTER — Emergency Department (HOSPITAL_COMMUNITY)
Admission: EM | Admit: 2018-03-24 | Discharge: 2018-03-24 | Disposition: A | Discharge status: Skilled Nursing Facility | Payer: Medicaid Other | Attending: Emergency Medicine | Admitting: Emergency Medicine

## 2018-03-24 ENCOUNTER — Emergency Department (HOSPITAL_COMMUNITY): Payer: Medicaid Other

## 2018-03-24 ENCOUNTER — Encounter (HOSPITAL_COMMUNITY): Payer: Self-pay | Admitting: Emergency Medicine

## 2018-03-24 DIAGNOSIS — W1811XA Fall from or off toilet without subsequent striking against object, initial encounter: Secondary | ICD-10-CM | POA: Insufficient documentation

## 2018-03-24 DIAGNOSIS — I2510 Atherosclerotic heart disease of native coronary artery without angina pectoris: Secondary | ICD-10-CM | POA: Insufficient documentation

## 2018-03-24 DIAGNOSIS — Y9389 Activity, other specified: Secondary | ICD-10-CM | POA: Diagnosis not present

## 2018-03-24 DIAGNOSIS — Z951 Presence of aortocoronary bypass graft: Secondary | ICD-10-CM | POA: Insufficient documentation

## 2018-03-24 DIAGNOSIS — Z7901 Long term (current) use of anticoagulants: Secondary | ICD-10-CM | POA: Diagnosis not present

## 2018-03-24 DIAGNOSIS — Z7902 Long term (current) use of antithrombotics/antiplatelets: Secondary | ICD-10-CM | POA: Diagnosis not present

## 2018-03-24 DIAGNOSIS — Z87891 Personal history of nicotine dependence: Secondary | ICD-10-CM | POA: Insufficient documentation

## 2018-03-24 DIAGNOSIS — M25552 Pain in left hip: Secondary | ICD-10-CM | POA: Diagnosis present

## 2018-03-24 DIAGNOSIS — M25512 Pain in left shoulder: Secondary | ICD-10-CM | POA: Diagnosis not present

## 2018-03-24 DIAGNOSIS — Y92121 Bathroom in nursing home as the place of occurrence of the external cause: Secondary | ICD-10-CM | POA: Insufficient documentation

## 2018-03-24 DIAGNOSIS — Z7982 Long term (current) use of aspirin: Secondary | ICD-10-CM | POA: Diagnosis not present

## 2018-03-24 DIAGNOSIS — M25562 Pain in left knee: Secondary | ICD-10-CM | POA: Diagnosis not present

## 2018-03-24 DIAGNOSIS — Z79899 Other long term (current) drug therapy: Secondary | ICD-10-CM | POA: Diagnosis not present

## 2018-03-24 DIAGNOSIS — Y999 Unspecified external cause status: Secondary | ICD-10-CM | POA: Insufficient documentation

## 2018-03-24 DIAGNOSIS — I10 Essential (primary) hypertension: Secondary | ICD-10-CM | POA: Diagnosis not present

## 2018-03-24 DIAGNOSIS — R0789 Other chest pain: Secondary | ICD-10-CM | POA: Diagnosis not present

## 2018-03-24 MED ORDER — METHOCARBAMOL 500 MG PO TABS
500.0000 mg | ORAL_TABLET | Freq: Once | ORAL | Status: AC
  Administered 2018-03-24: 500 mg via ORAL
  Filled 2018-03-24: qty 1

## 2018-03-24 MED ORDER — OXYCODONE-ACETAMINOPHEN 5-325 MG PO TABS
1.0000 | ORAL_TABLET | Freq: Once | ORAL | Status: AC
  Administered 2018-03-24: 1 via ORAL
  Filled 2018-03-24: qty 1

## 2018-03-24 NOTE — ED Provider Notes (Signed)
[facility] DEPT Provider Note   CSN: [id number] Arrival date & time: 03/24/18  4098     History   Chief Complaint Chief Complaint  Patient presents with  . Hip Pain    HPI Kimberly [name] is a 62 y.o. female.  HPI 62 year old female who states that she fell off the toilet on Wednesday when she was being assisted by the CNA at her facility.  She reports left shoulder left knee and left hip pain as well as left lateral chest pain worse with palpation.  She reportedly had x-rays done as an outpatient but continues to have pain at this time.  She has a history of prior stroke with weakness of the left side at baseline.  She is being treated with hydrocodone at her facility but states ongoing discomfort and pain.   Past Medical History:  Diagnosis Date  . Bicipital tenosynovitis   . Coronary artery disease 05/2007   [id number]   . Fall 02/13/2018  . Family history of ischemic heart disease   . Gout   . Hammer toe   . Hip, thigh, leg, and ankle, insect bite, nonvenomous, without mention of infection(916.4)   . Hyperlipidemia   . Hypertension   . Other abnormality of red blood cells   . Rotator cuff syndrome of left shoulder   . Stroke [facility])     Patient Active Problem List   Diagnosis Date Noted  . Malnutrition of moderate degree 02/15/2018  . Carotid occlusion, bilateral   . Atrial fibrillation with RVR ([place]) 02/13/2018  . Positive D dimer 02/13/2018  . Back pain 02/13/2018  . CVA (cerebral vascular accident) ([place]) 02/13/2018  . Paroxysmal atrial fibrillation ([place]) 02/13/2018  . Coronary artery disease involving native coronary artery of native heart without angina pectoris 02/13/2018  . 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 Aortic atherosclerosis ([place]) 02/13/2018  . CAD, ARTERY BYPASS GRAFT 05/03/2009  . TOBACCO USE, QUIT 05/03/2009  . ROTATOR CUFF SYNDROME, LEFT  02/12/2009  . Shoulder pain, left 11/03/2008  . HAMMER TOE, ACQUIRED 02/11/2008  . INSECT BITE, LEG 02/11/2008  . Cerebral artery occlusion with cerebral infarction ([place]) 12/05/2007  . Dyslipidemia 11/05/2007  . UNSPECIFIED DISORDER TEETH&SUPPORTING STRUCTURES 11/05/2007  . BICEPS TENDINITIS, RIGHT 04/24/2007  . Essential hypertension 04/23/2007  . MICROCYTOSIS 04/23/2007    Past Surgical History:  Procedure Laterality Date  . CARDIAC CATHETERIZATION    . CORONARY ARTERY BYPASS GRAFT     triple  . CORONARY ARTERY BYPASS GRAFT  2008  . TUBAL LIGATION       OB History   None      Home Medications    Prior to Admission medications   Medication Sig Start Date End Date Taking? Authorizing Provider  acetaminophen (TYLENOL) 500 MG tablet Take 1,000 mg by mouth every 6 (six) hours as needed for moderate pain.   Yes [provider]  amLODipine (NORVASC) 10 MG tablet Take 1 tablet (10 mg total) by mouth daily. 02/17/18 03/24/18 Yes Thurnell Lose, MD  apixaban (ELIQUIS) 5 MG TABS tablet Take 1 tablet (5 mg total) by mouth 2 (two) times daily. Start on 02/20/2018, stop Plavix when you start this medication. 02/20/18  Yes Thurnell Lose, MD  aspirin 81 MG tablet Take 81 mg by mouth daily.     Yes [provider]  atorvastatin (LIPITOR) 80 MG tablet Take 1 tablet (80 mg total) by mouth daily at 6  PM. 02/17/18  Yes Thurnell Lose, MD  colchicine 0.6 MG tablet Take 1 tablet (0.6 mg total) by mouth 2 (two) times daily. 12/14/16  Yes [name], MD  HYDROcodone-acetaminophen (NORCO/VICODIN) 5-325 MG tablet Take 1 tablet by mouth every 6 (six) hours as needed for moderate pain.   Yes [provider]  lidocaine (LIDODERM) 5 % Place 1 patch onto the skin daily. Remove & Discard patch within 12 hours or as directed by MD   Yes [provider]  lisinopril (PRINIVIL,ZESTRIL) 40 MG tablet Take 40 mg by mouth daily.   Yes [provider]  Multiple  Vitamins-Minerals (MULTIVITAMIN WITH MINERALS) tablet Take 1 tablet by mouth daily.     Yes [provider]  nicotine (NICODERM CQ - DOSED IN MG/24 HOURS) 21 mg/24hr patch Place 21 mg onto the skin daily.   Yes [provider]  omeprazole (PRILOSEC) 20 MG capsule Take 20 mg by mouth daily.   Yes [provider]  Blood Pressure Monitoring (B-D ASSURE BPM/AUTO ARM CUFF) MISC 1 Device by Does not apply route once. 04/27/14   [name], MD  clopidogrel (PLAVIX) 75 MG tablet Take 1 tablet (75 mg total) by mouth daily. Patient not taking: Reported on 03/24/2018 03/27/17   [name], MD  feeding supplement, ENSURE COMPLETE, (ENSURE COMPLETE) LIQD Take 237 mLs by mouth 2 (two) times daily between meals. Patient not taking: Reported on 02/13/2018 01/30/17   [name], MD  pantoprazole (PROTONIX) 40 MG tablet Take 1 tablet (40 mg total) by mouth daily. Patient not taking: Reported on 03/24/2018 02/17/18   Thurnell Lose, MD  quinapril (ACCUPRIL) 40 MG tablet Take 1 tablet (40 mg total) by mouth daily. MUST KEEP OV FOR ADDITIONAL REFILLS Patient not taking: Reported on 03/24/2018 12/18/17   [name], MD    Family History Family History  Problem Relation Age of Onset  . Hypertension Mother   . Heart attack Father   . Diabetes Father   . Anxiety disorder Sister   . Sarcoidosis Sister     Social History Social History   Tobacco Use  . Smoking status: Former Research scientist (life sciences)  . Smokeless tobacco: Former Systems developer    Quit date: 08/09/2008  Substance Use Topics  . Alcohol use: Yes    Alcohol/week: 1.0 standard drinks    Types: 1 Cans of beer per week  . Drug use: Yes    Types: Marijuana    Comment: history of cocaine use     Allergies   Clonidine derivatives and Spironolactone   Review of Systems Review of Systems  All other systems reviewed and are negative.    Physical Exam Updated Vital Signs BP (!) 148/103 (BP Location: Right Arm)   Pulse 78    Temp 97.8 F (36.6 C) (Oral)   Resp 16   Ht 5\' 9"  (1.753 m)   Wt 51.3 kg   SpO2 100%   BMI 16.69 kg/m   Physical Exam  Constitutional: She is oriented to person, place, and time. She appears well-developed and well-nourished.  HENT:  Head: Normocephalic.  Eyes: EOM are normal.  Neck: Normal range of motion. Neck supple.  Pulmonary/Chest: Effort normal.  Abdominal: She exhibits no distension.  Musculoskeletal: Normal range of motion. She exhibits no deformity.  Able to range her bilateral shoulders, elbows, wrists.  Full range of motion bilateral hips, knees, ankles.  Normal pulses in her bilateral radial arteries and bilateral PT and DP arteries.  Neurological: She is alert  and oriented to person, place, and time.  Psychiatric: She has a normal mood and affect.  Nursing note and vitals reviewed.    ED Treatments / Results  Labs (all labs ordered are listed, but only abnormal results are displayed) Labs Reviewed - No data to display  EKG None  Radiology Dg Chest 2 View  Result Date: 03/24/2018 CLINICAL DATA:  62 y/o  F; fall with pain. EXAM: CHEST - 2 VIEW COMPARISON:  02/13/2018 CT angiogram of the chest. 06/25/2011 chest radiograph. FINDINGS: Stable cardiomegaly given projection and technique. Post median sternotomy and CABG. Calcific aortic atherosclerosis. Pulmonary vascular congestion. Consolidation, effusion, or pneumothorax. No acute osseous abnormality is evident. IMPRESSION: Stable cardiomegaly and aortic atherosclerosis. Mild pulmonary vascular congestion. No consolidation. Electronically Signed   By: [name] Garbe M.D.   On: 03/24/2018 06:29   Dg Shoulder Left  Result Date: 03/24/2018 CLINICAL DATA:  62 y/o  F; fall with persistent pain. EXAM: LEFT SHOULDER - 2+ VIEW COMPARISON:  02/13/2018 left shoulder radiographs FINDINGS: There is no evidence of fracture or dislocation. There is no evidence of arthropathy or other focal bone abnormality. Soft tissues  are unremarkable. Stable small calcified ossicle lateral to the acromion. IMPRESSION: No acute fracture or dislocation identified. Stable left shoulder radiographs. Electronically Signed   By: [name] Garbe M.D.   On: 03/24/2018 06:34   Dg Knee Complete 4 Views Left  Result Date: 03/24/2018 CLINICAL DATA:  62 y/o  F; fall with pain. EXAM: LEFT KNEE - COMPLETE 4+ VIEW COMPARISON:  None. FINDINGS: No evidence of fracture, dislocation, or joint effusion. Joint spaces are maintained. Spurring of the tibial spines. Vascular calcifications noted. IMPRESSION: No acute fracture or dislocation identified. Electronically Signed   By: [name] Garbe M.D.   On: 03/24/2018 06:32   Dg Hip Unilat W Or Wo Pelvis 2-3 Views Left  Result Date: 03/24/2018 CLINICAL DATA:  62 y/o  F; fall with pain. EXAM: DG HIP (WITH OR WITHOUT PELVIS) 2-3V LEFT COMPARISON:  None. FINDINGS: There is no evidence of hip fracture or dislocation. There is no evidence of arthropathy or other focal bone abnormality. IMPRESSION: Negative. Electronically Signed   By: [name] Garbe M.D.   On: 03/24/2018 06:30    Procedures Procedures (including critical care time)  Medications Ordered in ED Medications  oxyCODONE-acetaminophen (PERCOCET/ROXICET) 5-325 MG per tablet 1 tablet (1 tablet Oral Given 03/24/18 0614)  methocarbamol (ROBAXIN) tablet 500 mg (500 mg Oral Given 03/24/18 1638)     Initial Impression / Assessment and Plan / ED Course  I have reviewed the triage vital signs and the nursing notes.  Pertinent labs & imaging results that were available during my care of the patient were reviewed by me and considered in my medical decision making (see chart for details).     X-rays without abnormality.  Suspect contusion and soreness.  Follow-up with her team.  Instructed to return to the ER for new or worsening symptoms  Final Clinical Impressions(s) / ED Diagnoses   Final diagnoses:  Acute hip pain,  left  Acute pain of left shoulder  Chest wall pain    ED Discharge Orders    None       Jola Schmidt, MD 03/24/18 [phone]

## 2018-03-24 NOTE — ED Notes (Signed)
Bed: QS12 Expected date:  Expected time:  Means of arrival:  Comments: 62 yo F/Fall-left rib/hip pain

## 2018-03-24 NOTE — ED Notes (Signed)
Report called to [facility]. °

## 2018-03-24 NOTE — ED Triage Notes (Signed)
Patient here from [place] with complaints of fall of toilet on Wednesday. X-ray done negative. Have been given Norco. Still reports pain. Refuse to be stuck for blood or IV.

## 2018-03-24 NOTE — ED Notes (Signed)
[facility] called  

## 2018-03-28 ENCOUNTER — Encounter: Payer: Self-pay | Admitting: Adult Health

## 2018-03-28 ENCOUNTER — Ambulatory Visit (INDEPENDENT_AMBULATORY_CARE_PROVIDER_SITE_OTHER): Payer: Medicaid Other | Admitting: Adult Health

## 2018-03-28 VITALS — BP 160/102 | HR 86 | Ht 67.0 in

## 2018-03-28 DIAGNOSIS — E785 Hyperlipidemia, unspecified: Secondary | ICD-10-CM

## 2018-03-28 DIAGNOSIS — I480 Paroxysmal atrial fibrillation: Secondary | ICD-10-CM

## 2018-03-28 DIAGNOSIS — I63431 Cerebral infarction due to embolism of right posterior cerebral artery: Secondary | ICD-10-CM | POA: Diagnosis not present

## 2018-03-28 DIAGNOSIS — I10 Essential (primary) hypertension: Secondary | ICD-10-CM

## 2018-03-28 NOTE — Patient Instructions (Signed)
Continue Eliquis (apixaban) daily  and lipitor  for secondary stroke prevention  Continue to follow up with PCP regarding cholesterol and blood pressure management   Schedule f/u visit with cardiologist for atrial fibrillation management  Continue to monitor blood pressure at home  Maintain strict control of hypertension with blood pressure goal below 130/90, diabetes with hemoglobin A1c goal below 6.5% and cholesterol with LDL cholesterol (bad cholesterol) goal below 70 mg/dL. I also advised the patient to eat a healthy diet with plenty of whole grains, cereals, fruits and vegetables, exercise regularly and maintain ideal body weight.  Followup in the future with me in 3 months or call earlier if needed       Thank you for coming to see [place] at [facility]. I hope we have been able to provide you high quality care today.  You may receive a patient satisfaction survey over the next few weeks. We would appreciate your feedback and comments so that we may continue to improve ourselves and the health of our patients.

## 2018-03-28 NOTE — Progress Notes (Signed)
[facility] [address] [place]. [place] [address] [phone]       OFFICE FOLLOW UP NOTE  Kimberly [name] [name] Date of Birth:  1956/04/06 Medical Record Number:  [id number]   Reason for Referral:  hospital stroke follow up  CHIEF COMPLAINT:  Chief Complaint  Patient presents with  . Follow-up    Stroke hospital follow up seen by Dr. Erlinda [name] pt alone in wheelchair no family or staff from nursing home present, Pt is at [facility] nursing home     HPI: Kimberly [name] [name] is being seen today for initial visit in the office for right PCA infarct due to right PCA occlusion with embolic pattern secondary to newly diagnosed atrial fibrillation on 02/13/2018. History obtained from patient and chart review. Reviewed all radiology images and labs personally.  [name] [name] is a 62 y.o. female with history of CAD s/p CABG in 2008, stroke in 2009 with right thalamic/IC infarct, chronic right ICA occlusion, HTN, HLD who was admitted for dizziness, fall and left hemianopia. No tPA given due to OSW.    CTA head and neck showed bilateral ICA occluded at origin, right PCA occlusion, VA irregularity in bilateral MCAs patent from collaterals.  Carotid Doppler showed bilateral ICA occlusion and possible left ICA fresh clot.  MRI head reviewed and showed large right PCA infarct with edema and petechial hemorrhage, bilateral ICA occlusion and old bilateral left greater than right MCA infarcts.  2D echo showing EF of 60 to 65%.  TCD was limited study but showed left ophthalmic a and right PCA flow reversal which was consistent with bilateral carotid system occlusion.  During admission, patient had A. fib and RVR and spontaneously reversed back to normal sinus rhythm.  Due to newly diagnosed A. fib, recommended DAPT for 5 to 7 days post stroke and recommended to start Eliquis along with continuation of aspirin.  LDL 100 and as patient was on Crestor PTA recommended to switch to Lipitor 80 mg.  HTN  stable during admission and recommended BP goal of 1 30-1 60 given bilateral ICA occlusion.  Patient was discharged to SNF for continued deficits.   Patient is being seen today for hospital stroke follow-up and is accompanied by her sister.  She continues to have left hemiparesis (chronic from previous stroke) along with vision loss.  Patient states that she is unable to see from her left eye and blurry with her right eye.  She was seen by ophthalmology and she states she was told this is due from her stroke.  She continues to take aspirin and Eliquis with bruising but no bleeding.  She continues to take Lipitor without side effects of myalgias.  Patient is participating in physical therapy at [facility] and rehab center where she is currently residing.  She plans on returning home but due to consistent falling, per her sister, they keep extending her stay.  Blood pressure elevated at 160/102 and per sister, it is typically lower at facility.  She complains of pain in her left shoulder, ribs and right hip.  She was recently seen in the ED for these pain complaints and it was determined that there were most likely residual pain from her falls as all imaging was negative.  She currently is using lidocaine patches along with Norco and Tylenol.  Denies new or worsening stroke/TIA symptoms.    ROS:   14 system review of systems performed and negative with exception of vision loss and weakness  PMH:  Past  Medical History:  Diagnosis Date  . Bicipital tenosynovitis   . Coronary artery disease 05/2007   [id number]   . Fall 02/13/2018  . Family history of ischemic heart disease   . Gout   . Hammer toe   . Hip, thigh, leg, and ankle, insect bite, nonvenomous, without mention of infection(916.4)   . Hyperlipidemia   . Hypertension   . Other abnormality of red blood cells   . Rotator cuff syndrome of left shoulder   . Stroke [facility])     PSH:  Past Surgical History:  Procedure Laterality Date  .  CARDIAC CATHETERIZATION    . CORONARY ARTERY BYPASS GRAFT     triple  . CORONARY ARTERY BYPASS GRAFT  2008  . TUBAL LIGATION      Social History:  Social History   Socioeconomic History  . Marital status: Single    Spouse name: Not on file  . Number of children: Not on file  . Years of education: Not on file  . Highest education level: Not on file  Occupational History  . Not on file  Social Needs  . Financial resource strain: Not on file  . Food insecurity:    Worry: Not on file    Inability: Not on file  . Transportation needs:    Medical: Not on file    Non-medical: Not on file  Tobacco Use  . Smoking status: Former Research scientist (life sciences)  . Smokeless tobacco: Former Systems developer    Quit date: 08/09/2008  Substance and Sexual Activity  . Alcohol use: Yes    Alcohol/week: 1.0 standard drinks    Types: 1 Cans of beer per week  . Drug use: Yes    Types: Marijuana    Comment: history of cocaine use  . Sexual activity: Not on file  Lifestyle  . Physical activity:    Days per week: Not on file    Minutes per session: Not on file  . Stress: Not on file  Relationships  . Social connections:    Talks on phone: Not on file    Gets together: Not on file    Attends religious service: Not on file    Active member of club or organization: Not on file    Attends meetings of clubs or organizations: Not on file    Relationship status: Not on file  . Intimate partner violence:    Fear of current or ex partner: Not on file    Emotionally abused: Not on file    Physically abused: Not on file    Forced sexual activity: Not on file  Other Topics Concern  . Not on file  Social History Narrative  . Not on file    Family History:  Family History  Problem Relation Age of Onset  . Hypertension Mother   . Heart attack Father   . Diabetes Father   . Anxiety disorder Sister   . Sarcoidosis Sister     Medications:   Current Outpatient Medications on File Prior to Visit  Medication Sig Dispense  Refill  . acetaminophen (TYLENOL) 500 MG tablet Take 1,000 mg by mouth every 6 (six) hours as needed for moderate pain.    [name] apixaban (ELIQUIS) 5 MG TABS tablet Take 1 tablet (5 mg total) by mouth 2 (two) times daily. Start on 02/20/2018, stop Plavix when you start this medication. 60 tablet 0  . aspirin 81 MG tablet Take 81 mg by mouth daily.      [name] atorvastatin (LIPITOR)  80 MG tablet Take 1 tablet (80 mg total) by mouth daily at 6 PM. 30 tablet 0  . Blood Pressure Monitoring (B-D ASSURE BPM/AUTO ARM CUFF) MISC 1 Device by Does not apply route once. 1 each 0  . clopidogrel (PLAVIX) 75 MG tablet Take 1 tablet (75 mg total) by mouth daily. 90 tablet 2  . colchicine 0.6 MG tablet Take 1 tablet (0.6 mg total) by mouth 2 (two) times daily. 60 tablet 0  . feeding supplement, ENSURE COMPLETE, (ENSURE COMPLETE) LIQD Take 237 mLs by mouth 2 (two) times daily between meals. 30 Bottle 5  . HYDROcodone-acetaminophen (NORCO/VICODIN) 5-325 MG tablet Take 1 tablet by mouth every 6 (six) hours as needed for moderate pain.    [name] lidocaine (LIDODERM) 5 % Place 1 patch onto the skin daily. Remove & Discard patch within 12 hours or as directed by MD    . lisinopril (PRINIVIL,ZESTRIL) 40 MG tablet Take 40 mg by mouth daily.    . Multiple Vitamins-Minerals (MULTIVITAMIN WITH MINERALS) tablet Take 1 tablet by mouth daily.      . nicotine (NICODERM CQ - DOSED IN MG/24 HOURS) 21 mg/24hr patch Place 21 mg onto the skin daily.    [name] omeprazole (PRILOSEC) 20 MG capsule Take 20 mg by mouth daily.    . pantoprazole (PROTONIX) 40 MG tablet Take 1 tablet (40 mg total) by mouth daily. 30 tablet 0  . quinapril (ACCUPRIL) 40 MG tablet Take 1 tablet (40 mg total) by mouth daily. MUST KEEP OV FOR ADDITIONAL REFILLS 60 tablet 0  . amLODipine (NORVASC) 10 MG tablet Take 1 tablet (10 mg total) by mouth daily. 30 tablet 0   No current facility-administered medications on file prior to visit.     Allergies:   Allergies  Allergen  Reactions  . Clonidine Derivatives Other (See Comments)    Patch causes scars  . Spironolactone Rash     Physical Exam  Vitals:   03/28/18 1314  BP: (!) 160/102  Pulse: 86  Height: 5\' 7"  (1.702 m)   Body mass index is 17.7 kg/m. No exam data present  General: Frail elderly African-American female, seated, in no evident distress Head: head normocephalic and atraumatic.   Neck: supple with no carotid or supraclavicular bruits Cardiovascular: regular rate and rhythm, no murmurs Musculoskeletal: no deformity Skin:  no rash/petichiae Vascular:  Normal pulses all extremities  Neurologic Exam Mental Status: Awake and fully alert. Oriented to place and time. Recent and remote memory intact. Attention span, concentration and fund of knowledge appropriate. Mood and affect appropriate.  Cranial Nerves: Fundoscopic exam reveals sharp disc margins. Pupils equal, briskly reactive to light. Extraocular movements full without nystagmus. Visual fields unable to accurately assess.  Hearing intact. Facial sensation intact. Face, tongue, palate moves normally and symmetrically.  Motor: Normal bulk and tone. Normal strength in the right upper and lower extremity but weakness noted in left upper and lower extremity but exam limited due to pain. Sensory.: intact to touch , pinprick , position and vibratory sensation.  Coordination: Rapid alternating movements normal in all extremities. Finger-to-nose and heel-to-shin performed accurately bilaterally. Gait and Station: Patient currently sitting in wheelchair and states she occasionally ambulates with physical therapy Reflexes: 1+ and symmetric. Toes downgoing.    NIHSS  1 Modified Rankin  3   Diagnostic Data (Labs, Imaging, Testing)  CT head without contrast 02/13/2018 IMPRESSION: 1. There is a moderate to large area of low attenuation involving the right temporal lobe and right occipital  lobe compatible with an acute or subacute right PCA  infarct. No hemorrhage identified and no significant mass effect. 2. Chronic left frontal lobe infarct with progressive global atrophy and chronic small vessel ischemic change. 3. No evidence for cervical spine fracture or dislocation. 4. Cervical spondylosis.  CTA head/neck with and without contrast 02/14/2018 IMPRESSION: Occlusion of both internal carotid arteries at their origins. 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 Left carotid occlusion is newly seen since the previous study of 2009. Reconstituted flow in the supraclinoid internal carotid arteries due to patent posterior communicating arteries and external to internal collaterals. Atherosclerotic irregularity of the anterior and middle cerebral branches, but with flow present. No vertebral stenosis. Atherosclerotic irregularity of the basilar but without flow limiting stenosis. Occlusion of the right PCA just beyond its origin, with more distal flow probably deriving from collateral vessels.  MR brain without contrast 02/14/2018 IMPRESSION: 1. Large acute right PCA territory infarct concordant with the right PCA occlusion demonstrated by CTA. Cytotoxic edema and petechial hemorrhage. No malignant hemorrhagic transformation or significant mass effect. 2. No other acute intracranial abnormality. 3. 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  Carotid Doppler   Unable to visualize flow in bilateral internal carotid arteries by color, power, and pulsed wave Doppler, therefore findings are suggestive of bilateral internal carotid artery occlusion.Left proximal internal carotid artery exhibits echogenic material, with an anechoic lumen distal to this area. This is suggestive of possible thrombus/embolic material. Bilateral external carotid arteries are patent with low-resistant flow. Bilateral vertebral arteries are patent with  low-resistant flow.  TTE  - Left ventricle: The cavity size was normal. Wall thickness wasincreased in a pattern of moderate LVH. Systolic function wasnormal. The estimated ejection fraction was in the range of 60%to 65%. There is hypokinesis of the basalinferolateral andinferior myocardium. Indeterminate diastolic function. - Aortic valve: Mildly calcified annulus. Trileaflet. - Aorta: Ascending aortic diameter: 44 mm (S). - Mitral valve: Mildly thickened leaflets. There was mildregurgitation. - Left atrium: The atrium was mildly to moderately dilated. - Right atrium: The atrium was mildly dilated. Central venouspressure (est): 3 mm Hg. - Atrial septum: No defect or patent foramen ovale was identified. - Tricuspid valve: There was mild regurgitation. - Pulmonary arteries: PA peak pressure: 31 mm Hg (S). - Pericardium, extracardiac: There was no pericardial effusion.  TCD A very technically limited and difficult study due to continuous patient vocalization and inability to stay still. unable to insonate posterior circulation bilaterally via transoccpital window, right anterior circulation via transtemporal window as well  as limited view of right transorbital window. Mean flow velocities were decreased in left MCA, ACA, terminal ICA, PCA indicating proximal left carotid occlusion or severe stenosis. Flow reversal at left opthalmic artery consistent with left carotid systme high grade stenosis or occlusion. Right ACA flow reveral indicates left to right intracranial collateralization, suggesting right carotid system high grade stenosis or occlusion. Bi-directional left ICA siphon and antegrade flow at right ICA siphon may be explained by posterior to anterior collateralization from bilateral PCOMs. cerebral vascular imaging studies such as CTA or MRA head, or catheter-based cerebral angiography for further evaluation are highly recomended.    ASSESSMENT: [name] is a 62 y.o. year  old female here with right PCA infarcts on 02/13/2018 secondary to right PCA occlusion secondary to newly diagnosed A. fib. Vascular risk factors include CAD, right ICA occlusion, HTN and HLD.     PLAN: -Continue  aspirin 81 mg daily and Eliquis (apixaban) daily  and lipitor  for secondary stroke prevention -F/u with PCP regarding your HLD and HTN management -f/u with cardiologist regarding AF management -continue to monitor BP at home -cotinue current therapies for deficits and minimize risk of falls -Maintain strict control of hypertension with blood pressure goal below 130/90, diabetes with hemoglobin A1c goal below 6.5% and cholesterol with LDL cholesterol (bad cholesterol) goal below 70 mg/dL. I also advised the patient to eat a healthy diet with plenty of whole grains, cereals, fruits and vegetables, exercise regularly and maintain ideal body weight.  Follow up in 3 months or call earlier if needed   Greater than 50% of time during this 25 minute visit was spent on counseling,explanation of diagnosis of right PCA infarcts, reviewing risk factor management of HLD, HTN and AF, planning of further management, discussion with patient and family and coordination of care    Venancio Poisson, [facility]  [facility] [address] [place], [place] [address]  Phone [phone] Fax [phone]

## 2018-03-28 NOTE — Progress Notes (Signed)
I agree with the above plan 

## 2018-04-11 ENCOUNTER — Encounter: Payer: Self-pay | Admitting: Cardiology

## 2018-04-11 ENCOUNTER — Ambulatory Visit (INDEPENDENT_AMBULATORY_CARE_PROVIDER_SITE_OTHER): Payer: Medicaid Other | Admitting: Cardiology

## 2018-04-11 VITALS — BP 157/94 | HR 64 | Ht 67.0 in | Wt 123.2 lb

## 2018-04-11 DIAGNOSIS — I10 Essential (primary) hypertension: Secondary | ICD-10-CM

## 2018-04-11 DIAGNOSIS — I2510 Atherosclerotic heart disease of native coronary artery without angina pectoris: Secondary | ICD-10-CM

## 2018-04-11 DIAGNOSIS — I480 Paroxysmal atrial fibrillation: Secondary | ICD-10-CM

## 2018-04-11 MED ORDER — CHLORTHALIDONE 25 MG PO TABS: 25.0000 mg | ORAL_TABLET | Freq: Every day | ORAL | 3 refills | Status: DC

## 2018-04-11 NOTE — Progress Notes (Signed)
HPI The patient presents as a new patient for follow up of CAD/CABG.   Since I last saw her she was in the hospital with right PCA subacute infarct thought to be related to PAF.  Carotid Doppler showed complete occlusion of bilateral internal carotid artery, and a subsequent CT angiogram of the neck confirmed bilateral internal carotid arteries at the origins, however these appeared to have reconstituted flow in the supraclinoid intracranial arteries from collaterals/posterior communicating arteries. TTE showspreserved EF without any embolic source.   I reviewed these records for this visit.   She is in rehab at a nursing home.  She gets around for the most part in wheelchair but can ambulate with a walker.  She has left-sided weakness.  She has some visual changes.  She is not noticing any atrial fibrillation or palpitations.  She is not having any chest discomfort, neck or arm discomfort.  She has no shortness of breath, PND or orthopnea.  She has some numbness and tingling in her extremities.   Allergies  Allergen Reactions  . Clonidine Derivatives Other (See Comments)    Patch causes scars  . Spironolactone Rash    Current Outpatient Medications  Medication Sig Dispense Refill  . acetaminophen (TYLENOL) 500 MG tablet Take 1,000 mg by mouth every 6 (six) hours as needed for moderate pain.    [name] amLODipine (NORVASC) 10 MG tablet Take 10 mg by mouth daily.    [name] apixaban (ELIQUIS) 5 MG TABS tablet Take 1 tablet (5 mg total) by mouth 2 (two) times daily. Start on 02/20/2018, stop Plavix when you start this medication. 60 tablet 0  . aspirin 81 MG tablet Take 81 mg by mouth daily.      [name] atorvastatin (LIPITOR) 80 MG tablet Take 1 tablet (80 mg total) by mouth daily at 6 PM. 30 tablet 0  . Blood Pressure Monitoring (B-D ASSURE BPM/AUTO ARM CUFF) MISC 1 Device by Does not apply route once. 1 each 0  . colchicine 0.6 MG tablet Take 1 tablet (0.6 mg total) by mouth 2 (two) times daily. 60  tablet 0  . feeding supplement, ENSURE COMPLETE, (ENSURE COMPLETE) LIQD Take 237 mLs by mouth 2 (two) times daily between meals. 30 Bottle 5  . HYDROcodone-acetaminophen (NORCO/VICODIN) 5-325 MG tablet Take 1 tablet by mouth every 6 (six) hours as needed for moderate pain.    [name] lidocaine (LIDODERM) 5 % Place 1 patch onto the skin daily. Remove & Discard patch within 12 hours or as directed by MD    . lisinopril (PRINIVIL,ZESTRIL) 40 MG tablet Take 40 mg by mouth daily.    . Multiple Vitamins-Minerals (MULTIVITAMIN WITH MINERALS) tablet Take 1 tablet by mouth daily.      . nicotine (NICODERM CQ - DOSED IN MG/24 HOURS) 21 mg/24hr patch Place 21 mg onto the skin daily.    [name] omeprazole (PRILOSEC) 20 MG capsule Take 20 mg by mouth daily.    . chlorthalidone (HYGROTON) 25 MG tablet Take 1 tablet (25 mg total) by mouth daily. 90 tablet 3   No current facility-administered medications for this visit.     Past Medical History:  Diagnosis Date  . Bicipital tenosynovitis   . Coronary artery disease 05/2007   [id number]   . Fall 02/13/2018  . Family history of ischemic heart disease   . Gout   . Hammer toe   . Hip, thigh, leg, and ankle, insect bite, nonvenomous, without mention of infection(916.4)   . Hyperlipidemia   .  Hypertension   . Other abnormality of red blood cells   . Rotator cuff syndrome of left shoulder   . Stroke [facility])     Past Surgical History:  Procedure Laterality Date  . CARDIAC CATHETERIZATION    . CORONARY ARTERY BYPASS GRAFT     triple  . CORONARY ARTERY BYPASS GRAFT  2008  . TUBAL LIGATION      ROS:  As stated in the HPI and negative for all other systems.  PHYSICAL EXAM BP (!) 157/94   Pulse 64   Ht 5\' 7"  (1.702 m)   Wt 123 lb 3.2 oz (55.9 kg)   BMI 19.30 kg/m   GEN:  No distress NECK:  No jugular venous distention at 90 degrees, waveform within normal limits, carotid upstroke brisk and symmetric, no bruits, no thyromegaly LUNGS:  Clear to auscultation  bilaterally CHEST:  Unremarkable HEART:  S1 and S2 within normal limits, no S3, no S4, no clicks, no rubs, 2 out of 6 apical brief systolic murmur distant heart sounds, no diastolic murmurs ABD:  Positive bowel sounds normal in frequency in pitch, no bruits, no rebound, no guarding, unable to assess midline mass or bruit with the patient seated. EXT:  2 plus pulses throughout, moderate edema, no cyanosis no clubbing NEURO: Left-sided upper and lower extremity weakness    EKG:  NA  04/11/2018   Lab Results  Component Value Date   CHOL 160 02/14/2018   TRIG 78 02/14/2018   HDL 44 02/14/2018   LDLCALC 100 (H) 02/14/2018    ASSESSMENT AND PLAN   CAD:   The patient has no new sypmtoms.  No further cardiovascular testing is indicated.  We will continue with aggressive risk reduction and meds as listed.    HTN: Her blood pressure is not controlled.  I am getting a chlorthalidone 25 mg daily.  She is had normal renal function and high potassium in the past so she should tolerate this with increased potassium in her diet.   She will have a follow up BMET in 2 weeks.    CAROTID STENOSIS:  She has bilateral carotid occlusion and will be managed medically.  No change in therapy.   PAF:  [name] has a CHA2DS2 - VASc score of 4.  I would like to apply an event monitor to see the burden of atrial fibrillation.  She was sinus rhythm when she left the hospital and in fact sinus rhythm after the initial presentation in the emergency room.[name]    HYPERLIPIDEMIA:  Recent lipids as above.  Continue current therapy.

## 2018-04-11 NOTE — Patient Instructions (Signed)
Medication Instructions:  START- Chlorthalidone 25 mg daily  If you need a refill on your cardiac medications before your next appointment, please call your pharmacy.  Labwork: None Ordered   Testing/Procedures: Your physician has recommended that you wear an event monitor. Event monitors are medical devices that record the heart's electrical activity. Doctors most often [place] these monitors to diagnose arrhythmias. Arrhythmias are problems with the speed or rhythm of the heartbeat. The monitor is a small, portable device. You can wear one while you do your normal daily activities. This is usually used to diagnose what is causing palpitations/syncope (passing out).   Follow-Up: Your physician wants you to follow-up in: 3 Month.    Thank you for choosing [facility]!!

## 2018-04-16 ENCOUNTER — Telehealth: Payer: Self-pay | Admitting: Cardiovascular Disease

## 2018-04-16 ENCOUNTER — Ambulatory Visit (INDEPENDENT_AMBULATORY_CARE_PROVIDER_SITE_OTHER): Payer: Medicaid Other

## 2018-04-16 DIAGNOSIS — I480 Paroxysmal atrial fibrillation: Secondary | ICD-10-CM

## 2018-04-16 NOTE — Telephone Encounter (Signed)
Was called by EKG monitoring company that patient reported a syncopal event. Was noted to be ni NSR in 49s. Number in chart was not responded to by anyone. Suggest calling again in AM.

## 2018-04-17 NOTE — Telephone Encounter (Addendum)
Received strip from [place] stating pt had an episode of feeling faint/dizzy. Attempted to contact pt. Left message to call back.

## 2018-04-18 ENCOUNTER — Telehealth: Payer: Self-pay

## 2018-04-18 NOTE — Telephone Encounter (Signed)
Called patient no answer.Left message on personal voice mail we received monitor strips on 04/16/18.Symptoms indicated fainted and dizzy.Monitor revealed NSR with pac's,pvc's.Calling to see how you are feeling.Advised to call back and speak to triage.

## 2018-04-19 ENCOUNTER — Telehealth: Payer: Self-pay

## 2018-04-19 NOTE — Telephone Encounter (Signed)
Spoke with pt. Adv pt that we were calling her regarding an alert that we received from the cardiac monitor [place]  on 04/16/18. Pt sts that she is currently residing at [facility]. Pt sts that she did not faint or fell like she was going to. She sts that she has rehab daily and sometimes experiences some dizziness when she stands up from her wheelchair. Pt denies any other symptoms.  Pt is frustrated with the staff at the rehab facility because they have been having trouble reapplying her monitor. Adv her that they can contact the manufactures customer service number if assistance is needed to reapply. Offered to call and speak with a nurse, pt sts that she doesn't feel that would be benefecial.They were able to get it applied and she is currently wearing the monitor. Adv her that copies of her monitor strips have been forwarded to Dr.Hochrein and we will call back if he has any recommendation.  Pt does not have a phone. Pt rqst that we call the facility directly if info needs to be communicated. She would also like a copy of her last ov/ with Dr.Hochrein and a mychart activation code mailed to the home address listed in her chart (done).

## 2018-04-20 NOTE — Telephone Encounter (Signed)
I don't see any rhythm strips.

## 2018-04-23 ENCOUNTER — Telehealth: Payer: Self-pay | Admitting: Cardiology

## 2018-04-23 NOTE — Telephone Encounter (Signed)
New Message        [facility] is calling to report a abnormal EKG

## 2018-04-23 NOTE — Telephone Encounter (Signed)
Received call from [facility] for abnormal EKG  New onset Afib 142 bpm auto triggered 9 AM this aM   Last strip at 9:24-atrial fib rate 147, strips being faxed to office  [place] where patient currently residing to assess patient-spoke to nurse who states patient was on the bike in physical therapy.   She states she rechecked heart rate since she stopped exercising and it is decreasing, down to 120.    Patient asymptomatic per nurse other than being anxious.  Advised to monitor HR and verify that HR does return to baseline.     Nurse confirmed she would monitor and advised to call back if HR does not return to baseline.   Will make Dr. Percival Spanish aware.  Hx: PAF on Eliquis-monitor placed to evaluate burden

## 2018-04-23 NOTE — Telephone Encounter (Signed)
Dr Percival Spanish reviewed pt rhythm strips

## 2018-04-25 NOTE — Telephone Encounter (Signed)
[facility] EKG tracings reviewed by Dr.Hochrein. No action required.

## 2018-05-07 ENCOUNTER — Telehealth: Payer: Self-pay | Admitting: Cardiology

## 2018-05-07 NOTE — Telephone Encounter (Signed)
I am not sure what orders they need from [place].

## 2018-05-07 NOTE — Telephone Encounter (Signed)
Called patient sister back, she states that they have released her sister from SNF and they are getting home health to come out to see her, but [facility] states that they need Dr.Hochrein to send what he wants ordered faxed to [place]. She misplaced the fax number but stated she would call back once she found it. Please advise in a letter what orders she will need and sign to send back to them. Thank you!

## 2018-05-07 NOTE — Telephone Encounter (Signed)
New Message:   Patient sister calling about some paper work that the Dr. Need  To fax over. Please call Patient sister back.

## 2018-05-08 ENCOUNTER — Telehealth: Payer: Self-pay | Admitting: Cardiology

## 2018-05-08 NOTE — Telephone Encounter (Signed)
Follow Up:     She said the fax number you need is [phone]. She also would liked for you to give her a call please.

## 2018-05-13 NOTE — Telephone Encounter (Signed)
Call and spoke with pt sister letting her know order for Home health Aide with need to be put in by Doctor in [place] whom is discharging pt.

## 2018-05-13 NOTE — Telephone Encounter (Signed)
Spoke with pt sister.

## 2018-05-20 ENCOUNTER — Ambulatory Visit: Payer: Medicaid Other | Admitting: Nurse Practitioner

## 2018-06-15 DIAGNOSIS — Z7982 Long term (current) use of aspirin: Secondary | ICD-10-CM

## 2018-06-15 DIAGNOSIS — I10 Essential (primary) hypertension: Secondary | ICD-10-CM

## 2018-06-15 DIAGNOSIS — H53462 Homonymous bilateral field defects, left side: Secondary | ICD-10-CM

## 2018-06-15 DIAGNOSIS — I69354 Hemiplegia and hemiparesis following cerebral infarction affecting left non-dominant side: Secondary | ICD-10-CM | POA: Diagnosis not present

## 2018-06-15 DIAGNOSIS — Z951 Presence of aortocoronary bypass graft: Secondary | ICD-10-CM

## 2018-06-15 DIAGNOSIS — Z7901 Long term (current) use of anticoagulants: Secondary | ICD-10-CM

## 2018-06-15 DIAGNOSIS — Z87891 Personal history of nicotine dependence: Secondary | ICD-10-CM

## 2018-06-15 DIAGNOSIS — I2510 Atherosclerotic heart disease of native coronary artery without angina pectoris: Secondary | ICD-10-CM

## 2018-06-15 DIAGNOSIS — R32 Unspecified urinary incontinence: Secondary | ICD-10-CM

## 2018-06-15 DIAGNOSIS — Z9181 History of falling: Secondary | ICD-10-CM

## 2018-06-15 DIAGNOSIS — I422 Other hypertrophic cardiomyopathy: Secondary | ICD-10-CM

## 2018-06-15 DIAGNOSIS — M109 Gout, unspecified: Secondary | ICD-10-CM

## 2018-06-15 DIAGNOSIS — I480 Paroxysmal atrial fibrillation: Secondary | ICD-10-CM | POA: Diagnosis not present

## 2018-06-15 DIAGNOSIS — I712 Thoracic aortic aneurysm, without rupture: Secondary | ICD-10-CM

## 2018-06-15 DIAGNOSIS — E785 Hyperlipidemia, unspecified: Secondary | ICD-10-CM

## 2018-06-26 ENCOUNTER — Telehealth: Payer: Self-pay

## 2018-06-26 ENCOUNTER — Ambulatory Visit: Payer: Medicaid Other | Admitting: Nurse Practitioner

## 2018-06-26 NOTE — Telephone Encounter (Signed)
Ricky Stabs PT at [facility] called wanting verbal orders for 2 times a week for 4 weeks.  Returned her call and advised her that those orders are okay. YRL,RMA

## 2018-06-27 ENCOUNTER — Ambulatory Visit: Payer: Medicaid Other | Admitting: Nurse Practitioner

## 2018-07-01 ENCOUNTER — Telehealth: Payer: Self-pay

## 2018-07-01 NOTE — Telephone Encounter (Signed)
Costella Hatcher called regarding pt stating he is seeing pt after she has had a stroke and it has affected the left side he wanted to schedule an appt for the pt and also wants verbal orders. [phone]  Returned his call left him a v/m to call office. Also attempted to call pt left her a v/m to call office so I can advise her that I have scheduled her an appointment. YRL,RMA

## 2018-07-02 ENCOUNTER — Encounter: Payer: Self-pay | Admitting: Adult Health

## 2018-07-02 ENCOUNTER — Ambulatory Visit: Payer: Medicaid Other | Admitting: Adult Health

## 2018-07-02 NOTE — Progress Notes (Deleted)
[facility] [address] [place].  [address] [phone]       OFFICE FOLLOW UP NOTE  Ms. Deane Melick Date of Birth:  05/24/1956 Medical Record Number:  [id number]   Reason for Referral:  hospital stroke follow up  CHIEF COMPLAINT:  No chief complaint on file.   HPI: Pietra Zuluaga is being seen today for initial visit in the office for right PCA infarct due to right PCA occlusion with embolic pattern secondary to newly diagnosed atrial fibrillation on 02/13/2018. History obtained from patient and chart review. Reviewed all radiology images and labs personally.  Ms. Naveah Brave is a 62 y.o. female with history of CAD s/p CABG in 2008, stroke in 2009 with right thalamic/IC infarct, chronic right ICA occlusion, HTN, HLD who was admitted for dizziness, fall and left hemianopia. No tPA given due to OSW.    CTA head and neck showed bilateral ICA occluded at origin, right PCA occlusion, VA irregularity in bilateral MCAs patent from collaterals.  Carotid Doppler showed bilateral ICA occlusion and possible left ICA fresh clot.  MRI head reviewed and showed large right PCA infarct with edema and petechial hemorrhage, bilateral ICA occlusion and old bilateral left greater than right MCA infarcts.  2D echo showing EF of 60 to 65%.  TCD was limited study but showed left ophthalmic a and right PCA flow reversal which was consistent with bilateral carotid system occlusion.  During admission, patient had A. fib and RVR and spontaneously reversed back to normal sinus rhythm.  Due to newly diagnosed A. fib, recommended DAPT for 5 to 7 days post stroke and recommended to start Eliquis along with continuation of aspirin.  LDL 100 and as patient was on Crestor PTA recommended to switch to Lipitor 80 mg.  HTN stable during admission and recommended BP goal of 1 30-1 60 given bilateral ICA occlusion.  Patient was discharged to SNF for continued deficits.   03/28/2018 visit: Patient is  being seen today for hospital stroke follow-up and is accompanied by her sister.  She continues to have left hemiparesis (chronic from previous stroke) along with vision loss.  Patient states that she is unable to see from her left eye and blurry with her right eye.  She was seen by ophthalmology and she states she was told this is due from her stroke.  She continues to take aspirin and Eliquis with bruising but no bleeding.  She continues to take Lipitor without side effects of myalgias.  Patient is participating in physical therapy at [facility] and rehab center where she is currently residing.  She plans on returning home but due to consistent falling, per her sister, they keep extending her stay.  Blood pressure elevated at 160/102 and per sister, it is typically lower at facility.  She complains of pain in her left shoulder, ribs and right hip.  She was recently seen in the ED for these pain complaints and it was determined that there were most likely residual pain from her falls as all imaging was negative.  She currently is using lidocaine patches along with Norco and Tylenol.  Denies new or worsening stroke/TIA symptoms.  Interval history 07/02/2018:     ROS:   14 system review of systems performed and negative with exception of vision loss and weakness  PMH:  Past Medical History:  Diagnosis Date  . Bicipital tenosynovitis   . Coronary artery disease 05/2007   [id number]   . Fall 02/13/2018  . Family history  of ischemic heart disease   . Gout   . Hammer toe   . Hip, thigh, leg, and ankle, insect bite, nonvenomous, without mention of infection(916.4)   . Hyperlipidemia   . Hypertension   . Other abnormality of red blood cells   . Rotator cuff syndrome of left shoulder   . Stroke [facility])     PSH:  Past Surgical History:  Procedure Laterality Date  . CARDIAC CATHETERIZATION    . CORONARY ARTERY BYPASS GRAFT     triple  . CORONARY ARTERY BYPASS GRAFT  2008  . TUBAL LIGATION       Social History:  Social History   Socioeconomic History  . Marital status: Single    Spouse name: Not on file  . Number of children: Not on file  . Years of education: Not on file  . Highest education level: Not on file  Occupational History  . Not on file  Social Needs  . Financial resource strain: Not on file  . Food insecurity:    Worry: Not on file    Inability: Not on file  . Transportation needs:    Medical: Not on file    Non-medical: Not on file  Tobacco Use  . Smoking status: Former Research scientist (life sciences)  . Smokeless tobacco: Former Systems developer    Quit date: 08/09/2008  Substance and Sexual Activity  . Alcohol use: Yes    Alcohol/week: 1.0 standard drinks    Types: 1 Cans of beer per week  . Drug use: Yes    Types: Marijuana    Comment: history of cocaine use  . Sexual activity: Not on file  Lifestyle  . Physical activity:    Days per week: Not on file    Minutes per session: Not on file  . Stress: Not on file  Relationships  . Social connections:    Talks on phone: Not on file    Gets together: Not on file    Attends religious service: Not on file    Active member of club or organization: Not on file    Attends meetings of clubs or organizations: Not on file    Relationship status: Not on file  . Intimate partner violence:    Fear of current or ex partner: Not on file    Emotionally abused: Not on file    Physically abused: Not on file    Forced sexual activity: Not on file  Other Topics Concern  . Not on file  Social History Narrative  . Not on file    Family History:  Family History  Problem Relation Age of Onset  . Hypertension Mother   . Heart attack Father   . Diabetes Father   . Anxiety disorder Sister   . Sarcoidosis Sister     Medications:   Current Outpatient Medications on File Prior to Visit  Medication Sig Dispense Refill  . acetaminophen (TYLENOL) 500 MG tablet Take 1,000 mg by mouth every 6 (six) hours as needed for moderate pain.    [name]  amLODipine (NORVASC) 10 MG tablet Take 10 mg by mouth daily.    [name] apixaban (ELIQUIS) 5 MG TABS tablet Take 1 tablet (5 mg total) by mouth 2 (two) times daily. Start on 02/20/2018, stop Plavix when you start this medication. 60 tablet 0  . aspirin 81 MG tablet Take 81 mg by mouth daily.      [name] atorvastatin (LIPITOR) 80 MG tablet Take 1 tablet (80 mg total) by mouth daily at  6 PM. 30 tablet 0  . Blood Pressure Monitoring (B-D ASSURE BPM/AUTO ARM CUFF) MISC 1 Device by Does not apply route once. 1 each 0  . chlorthalidone (HYGROTON) 25 MG tablet Take 1 tablet (25 mg total) by mouth daily. 90 tablet 3  . colchicine 0.6 MG tablet Take 1 tablet (0.6 mg total) by mouth 2 (two) times daily. 60 tablet 0  . feeding supplement, ENSURE COMPLETE, (ENSURE COMPLETE) LIQD Take 237 mLs by mouth 2 (two) times daily between meals. 30 Bottle 5  . HYDROcodone-acetaminophen (NORCO/VICODIN) 5-325 MG tablet Take 1 tablet by mouth every 6 (six) hours as needed for moderate pain.    [name] lidocaine (LIDODERM) 5 % Place 1 patch onto the skin daily. Remove & Discard patch within 12 hours or as directed by MD    . lisinopril (PRINIVIL,ZESTRIL) 40 MG tablet Take 40 mg by mouth daily.    . Multiple Vitamins-Minerals (MULTIVITAMIN WITH MINERALS) tablet Take 1 tablet by mouth daily.      . nicotine (NICODERM CQ - DOSED IN MG/24 HOURS) 21 mg/24hr patch Place 21 mg onto the skin daily.    [name] omeprazole (PRILOSEC) 20 MG capsule Take 20 mg by mouth daily.     No current facility-administered medications on file prior to visit.     Allergies:   Allergies  Allergen Reactions  . Clonidine Derivatives Other (See Comments)    Patch causes scars  . Spironolactone Rash     Physical Exam  There were no vitals filed for this visit. There is no height or weight on file to calculate BMI. No exam data present  General: Frail elderly African-American female, seated, in no evident distress Head: head normocephalic and atraumatic.   Neck:  supple with no carotid or supraclavicular bruits Cardiovascular: regular rate and rhythm, no murmurs Musculoskeletal: no deformity Skin:  no rash/petichiae Vascular:  Normal pulses all extremities  Neurologic Exam Mental Status: Awake and fully alert. Oriented to place and time. Recent and remote memory intact. Attention span, concentration and fund of knowledge appropriate. Mood and affect appropriate.  Cranial Nerves: Fundoscopic exam reveals sharp disc margins. Pupils equal, briskly reactive to light. Extraocular movements full without nystagmus. Visual fields unable to accurately assess.  Hearing intact. Facial sensation intact. Face, tongue, palate moves normally and symmetrically.  Motor: Normal bulk and tone. Normal strength in the right upper and lower extremity but weakness noted in left upper and lower extremity but exam limited due to pain. Sensory.: intact to touch , pinprick , position and vibratory sensation.  Coordination: Rapid alternating movements normal in all extremities. Finger-to-nose and heel-to-shin performed accurately bilaterally. Gait and Station: Patient currently sitting in wheelchair and states she occasionally ambulates with physical therapy Reflexes: 1+ and symmetric. Toes downgoing.    NIHSS  1 Modified Rankin  3   Diagnostic Data (Labs, Imaging, Testing)  CT head without contrast 02/13/2018 IMPRESSION: 1. There is a moderate to large area of low attenuation involving the right temporal lobe and right occipital lobe compatible with an acute or subacute right PCA infarct. No hemorrhage identified and no significant mass effect. 2. Chronic left frontal lobe infarct with progressive global atrophy and chronic small vessel ischemic change. 3. No evidence for cervical spine fracture or dislocation. 4. Cervical spondylosis.  CTA head/neck with and without contrast 02/14/2018 IMPRESSION: Occlusion of both internal carotid arteries at their origins.  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 Left carotid occlusion is newly seen since the previous study of 2009. Reconstituted  flow in the supraclinoid internal carotid arteries due to patent posterior communicating arteries and external to internal collaterals. Atherosclerotic irregularity of the anterior and middle cerebral branches, but with flow present. No vertebral stenosis. Atherosclerotic irregularity of the basilar but without flow limiting stenosis. Occlusion of the right PCA just beyond its origin, with more distal flow probably deriving from collateral vessels.  MR brain without contrast 02/14/2018 IMPRESSION: 1. Large acute right PCA territory infarct concordant with the right PCA occlusion demonstrated by CTA. Cytotoxic edema and petechial hemorrhage. No malignant hemorrhagic transformation or significant mass effect. 2. No other acute intracranial abnormality. 3. 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  Carotid Doppler   Unable to visualize flow in bilateral internal carotid arteries by color, power, and pulsed wave Doppler, therefore findings are suggestive of bilateral internal carotid artery occlusion.Left proximal internal carotid artery exhibits echogenic material, with an anechoic lumen distal to this area. This is suggestive of possible thrombus/embolic material. Bilateral external carotid arteries are patent with low-resistant flow. Bilateral vertebral arteries are patent with low-resistant flow.  TTE  - Left ventricle: The cavity size was normal. Wall thickness wasincreased in a pattern of moderate LVH. Systolic function wasnormal. The estimated ejection fraction was in the range of 60%to 65%. There is hypokinesis of the basalinferolateral andinferior myocardium. Indeterminate diastolic function. - Aortic valve: Mildly calcified annulus. Trileaflet. -  Aorta: Ascending aortic diameter: 44 mm (S). - Mitral valve: Mildly thickened leaflets. There was mildregurgitation. - Left atrium: The atrium was mildly to moderately dilated. - Right atrium: The atrium was mildly dilated. Central venouspressure (est): 3 mm Hg. - Atrial septum: No defect or patent foramen ovale was identified. - Tricuspid valve: There was mild regurgitation. - Pulmonary arteries: PA peak pressure: 31 mm Hg (S). - Pericardium, extracardiac: There was no pericardial effusion.  TCD A very technically limited and difficult study due to continuous patient vocalization and inability to stay still. unable to insonate posterior circulation bilaterally via transoccpital window, right anterior circulation via transtemporal window as well  as limited view of right transorbital window. Mean flow velocities were decreased in left MCA, ACA, terminal ICA, PCA indicating proximal left carotid occlusion or severe stenosis. Flow reversal at left opthalmic artery consistent with left carotid systme high grade stenosis or occlusion. Right ACA flow reveral indicates left to right intracranial collateralization, suggesting right carotid system high grade stenosis or occlusion. Bi-directional left ICA siphon and antegrade flow at right ICA siphon may be explained by posterior to anterior collateralization from bilateral PCOMs. cerebral vascular imaging studies such as CTA or MRA head, or catheter-based cerebral angiography for further evaluation are highly recomended.    ASSESSMENT: Bethene Hankinson is a 62 y.o. year old female here with right PCA infarcts on 02/13/2018 secondary to right PCA occlusion secondary to newly diagnosed A. fib. Vascular risk factors include CAD, right ICA occlusion, HTN and HLD.     PLAN: -Continue aspirin 81 mg daily and Eliquis (apixaban) daily  and lipitor  for secondary stroke prevention -F/u with PCP regarding your HLD and HTN management -f/u with cardiologist  regarding AF management -continue to monitor BP at home -cotinue current therapies for deficits and minimize risk of falls -Maintain strict control of hypertension with blood pressure goal below 130/90, diabetes with hemoglobin A1c goal below 6.5% and cholesterol with LDL cholesterol (bad cholesterol) goal below 70 mg/dL. I also advised the patient to eat  a healthy diet with plenty of whole grains, cereals, fruits and vegetables, exercise regularly and maintain ideal body weight.  Follow up in 3 months or call earlier if needed   Greater than 50% of time during this 25 minute visit was spent on counseling,explanation of diagnosis of right PCA infarcts, reviewing risk factor management of HLD, HTN and AF, planning of further management, discussion with patient and family and coordination of care    Venancio Poisson, [facility]  [facility] [address] [place], [place] [address]  Phone [phone] Fax [phone]

## 2018-07-03 ENCOUNTER — Telehealth: Payer: Self-pay

## 2018-07-03 NOTE — Telephone Encounter (Signed)
Costella Hatcher PT at [facility] called requesting verbal orders for 1 time a week for a week and 2 times a week for 4 weeks.   Returned his call and advised him those orders were ok per [name] FNP-BC. YRL,RMA

## 2018-07-05 ENCOUNTER — Inpatient Hospital Stay: Payer: Self-pay | Admitting: Nurse Practitioner

## 2018-07-09 ENCOUNTER — Ambulatory Visit: Payer: Medicaid Other | Admitting: Adult Health

## 2018-07-10 ENCOUNTER — Inpatient Hospital Stay: Payer: Self-pay | Admitting: Nurse Practitioner

## 2018-07-17 ENCOUNTER — Telehealth: Payer: Self-pay

## 2018-07-17 NOTE — Telephone Encounter (Signed)
Called the pt to let her know that she needed to keep her next appt because the NP Ms. Laurance Flatten has been signing off on orders for the pt and the pt needs a follow-up.  The pt's sister said that the pt couldn't come to the phone and that she would speak on her behalf because the pt had a stroke.  She was given the information and she said that she would make sure the pt came.

## 2018-07-22 ENCOUNTER — Ambulatory Visit: Payer: Medicaid Other | Admitting: Cardiology

## 2018-07-24 ENCOUNTER — Encounter (INDEPENDENT_AMBULATORY_CARE_PROVIDER_SITE_OTHER): Payer: Self-pay

## 2018-07-24 ENCOUNTER — Encounter: Payer: Self-pay | Admitting: Nurse Practitioner

## 2018-07-24 ENCOUNTER — Ambulatory Visit (INDEPENDENT_AMBULATORY_CARE_PROVIDER_SITE_OTHER): Payer: Medicaid Other | Admitting: Nurse Practitioner

## 2018-07-24 VITALS — BP 112/64 | HR 73 | Temp 97.8°F | Ht 64.0 in | Wt 120.0 lb

## 2018-07-24 DIAGNOSIS — I4891 Unspecified atrial fibrillation: Secondary | ICD-10-CM | POA: Diagnosis not present

## 2018-07-24 DIAGNOSIS — Z09 Encounter for follow-up examination after completed treatment for conditions other than malignant neoplasm: Secondary | ICD-10-CM | POA: Diagnosis not present

## 2018-07-24 DIAGNOSIS — H547 Unspecified visual loss: Secondary | ICD-10-CM

## 2018-07-24 DIAGNOSIS — R7303 Prediabetes: Secondary | ICD-10-CM

## 2018-07-24 DIAGNOSIS — I10 Essential (primary) hypertension: Secondary | ICD-10-CM

## 2018-07-24 DIAGNOSIS — I69359 Hemiplegia and hemiparesis following cerebral infarction affecting unspecified side: Secondary | ICD-10-CM

## 2018-07-24 DIAGNOSIS — I69398 Other sequelae of cerebral infarction: Secondary | ICD-10-CM

## 2018-07-24 DIAGNOSIS — I639 Cerebral infarction, unspecified: Secondary | ICD-10-CM

## 2018-07-24 DIAGNOSIS — R296 Repeated falls: Secondary | ICD-10-CM

## 2018-07-24 DIAGNOSIS — M791 Myalgia, unspecified site: Secondary | ICD-10-CM

## 2018-07-24 DIAGNOSIS — Z7982 Long term (current) use of aspirin: Secondary | ICD-10-CM

## 2018-07-24 DIAGNOSIS — M1A9XX0 Chronic gout, unspecified, without tophus (tophi): Secondary | ICD-10-CM

## 2018-07-24 DIAGNOSIS — Z741 Need for assistance with personal care: Secondary | ICD-10-CM

## 2018-07-24 MED ORDER — DICLOFENAC SODIUM 1 % TD GEL: 2.0000 g | Freq: Four times a day (QID) | TRANSDERMAL | 3 refills | Status: DC

## 2018-07-24 NOTE — Progress Notes (Signed)
Subjective:     Patient ID: Kimberly [name] , female    DOB: [birth year] , 62 y.o.   MRN: [id number]   Chief Complaint  Patient presents with  . Hospitalization Follow-up    HPI  Here for Her sister has been coming to check on her.  She has PT/OT and home health nurse.  Now needs assistance with ADLs.  She needs help with cleaning, bathroom assistance and meal prep.    She has had 2-3 falls since she has been home.  Last one 2 weeks ago and got a carpet burn.  She is having blurred vision.    Hypertension  This is a chronic problem. The current episode started more than 1 year ago. The problem is controlled. Pertinent negatives include no anxiety, chest pain, headaches, malaise/fatigue, palpitations or shortness of breath. There are no associated agents to hypertension. Risk factors for coronary artery disease include sedentary lifestyle, diabetes mellitus, dyslipidemia and obesity. Past treatments include nothing. Hypertensive end-organ damage includes CAD/MI and CVA. There is no history of angina or kidney disease.     Past Medical History:  Diagnosis Date  . Bicipital tenosynovitis   . Coronary artery disease 05/2007   [id number]   . Fall 02/13/2018  . Family history of ischemic heart disease   . Gout   . Hammer toe   . Hip, thigh, leg, and ankle, insect bite, nonvenomous, without mention of infection(916.4)   . Hyperlipidemia   . Hypertension   . Other abnormality of red blood cells   . Rotator cuff syndrome of left shoulder   . Stroke [facility])      Family History  Problem Relation Age of Onset  . Hypertension Mother   . Heart attack Father   . Diabetes Father   . Anxiety disorder Sister   . Sarcoidosis Sister      Current Outpatient Medications:  .  acetaminophen (TYLENOL) 500 MG tablet, Take 1,000 mg by mouth every 6 (six) hours as needed for moderate pain., Disp: , Rfl:  .  amLODipine (NORVASC) 10 MG tablet, Take 10 mg by mouth daily., Disp: , Rfl:  .  apixaban  (ELIQUIS) 5 MG TABS tablet, Take 1 tablet (5 mg total) by mouth 2 (two) times daily. Start on 02/20/2018, stop Plavix when you start this medication., Disp: 60 tablet, Rfl: 0 .  aspirin 81 MG tablet, Take 81 mg by mouth daily.  , Disp: , Rfl:  .  atorvastatin (LIPITOR) 80 MG tablet, Take 1 tablet (80 mg total) by mouth daily at 6 PM., Disp: 30 tablet, Rfl: 0 .  Blood Pressure Monitoring (B-D ASSURE BPM/AUTO ARM CUFF) MISC, 1 Device by Does not apply route once., Disp: 1 each, Rfl: 0 .  chlorthalidone (HYGROTON) 25 MG tablet, Take 1 tablet (25 mg total) by mouth daily., Disp: 90 tablet, Rfl: 3 .  colchicine 0.6 MG tablet, Take 1 tablet (0.6 mg total) by mouth 2 (two) times daily., Disp: 60 tablet, Rfl: 0 .  feeding supplement, ENSURE COMPLETE, (ENSURE COMPLETE) LIQD, Take 237 mLs by mouth 2 (two) times daily between meals., Disp: 30 Bottle, Rfl: 5 .  HYDROcodone-acetaminophen (NORCO/VICODIN) 5-325 MG tablet, Take 1 tablet by mouth every 6 (six) hours as needed for moderate pain., Disp: , Rfl:  .  lidocaine (LIDODERM) 5 %, Place 1 patch onto the skin daily. Remove & Discard patch within 12 hours or as directed by MD, Disp: , Rfl:  .  lisinopril (PRINIVIL,ZESTRIL) 40 MG tablet, Take 40  mg by mouth daily., Disp: , Rfl:  .  Multiple Vitamins-Minerals (MULTIVITAMIN WITH MINERALS) tablet, Take 1 tablet by mouth daily.  , Disp: , Rfl:  .  nicotine (NICODERM CQ - DOSED IN MG/24 HOURS) 21 mg/24hr patch, Place 21 mg onto the skin daily., Disp: , Rfl:  .  omeprazole (PRILOSEC) 20 MG capsule, Take 20 mg by mouth daily., Disp: , Rfl:    Allergies  Allergen Reactions  . Clonidine Derivatives Other (See Comments)    Patch causes scars  . Spironolactone Rash     Review of Systems  Constitutional: Negative.  Negative for fatigue and malaise/fatigue.  Eyes: Negative for visual disturbance.  Respiratory: Negative.  Negative for shortness of breath.   Cardiovascular: Negative.  Negative for chest pain,  palpitations and leg swelling.  Gastrointestinal: Negative.   Endocrine: Negative.   Musculoskeletal: Negative.   Skin: Negative.   Neurological: Negative for dizziness, weakness and headaches.  Psychiatric/Behavioral: Negative for confusion. The patient is not nervous/anxious.      Today's Vitals   07/24/18 1054  BP: 112/64  Pulse: 73  Temp: 97.8 F (36.6 C)  TempSrc: Oral  SpO2: 96%  Weight: 120 lb (54.4 kg)  Height: 5\' 4"  (1.626 m)  PainSc: 8   PainLoc: Hand   Body mass index is 20.6 kg/m.   Objective:  Physical Exam  Constitutional: She is oriented to person, place, and time. She appears well-developed and well-nourished.  Eyes: Pupils are equal, round, and reactive to light.  Cardiovascular: Normal rate, regular rhythm, normal heart sounds and intact distal pulses.  No murmur heard. Pulmonary/Chest: Effort normal and breath sounds normal.  Musculoskeletal: Normal range of motion. She exhibits no edema.  Neurological: She is alert and oriented to person, place, and time. No cranial nerve deficit.  Skin: Skin is warm and dry. Capillary refill takes less than 2 seconds.  Psychiatric: She has a normal mood and affect.  Vitals reviewed.        Assessment And Plan:     1. Cerebrovascular accident (CVA), unspecified mechanism ([place])  Here for hospital follow up post stroke in July 2019, was cooking her breakfast when she opened the refrigerator she had vision disturbances and fell.  She was found 4 hours later.  She had a stroke to the right PCA, she was in the rehab until October 29th, has been home since.   Continues with follow up with Neurology  2. Atrial fibrillation, unspecified type ([place])  She is to continue with Eliquis  Heart rate is currently normal  3. Essential hypertension . B/P is controlled.  [name] BMP ordered to check renal function. .  The importance of regular exercise as tolerated in the chair  and dietary modification was stressed to the patient.  Stressed importance of losing ten percent of her body weight to help with B/P control. T  - CMP14 + Anion Gap - Lipid panel - CBC  4. Vision loss  She continues to have difficulty with her vision, she is right a six months post stroke however I will refer her to ophthalmology for further evaluation - Ambulatory referral to Ophthalmology  5. Need for assistance with personal care  Will complete a form for PCS services and she has home health, PT/OT and HHA visiting currently  6. Chronic gout without tophus, unspecified cause, unspecified site Chronic, controlled Continue with current medications - Uric acid  7. Prediabetes  Chronic, controlled  Continue with current medications  Encouraged to limit intake of  sugary foods and drinks  Encouraged to increase physical activity to 150 minutes per week as tolerated - Hemoglobin A1c  8. Muscle ache  She is having muscle aching to her left arm and left leg likely related to her recent stroke and the hemiparesis   wil try to get diclofenac gel - diclofenac sodium (VOLTAREN) 1 % GEL; Apply 2 g topically 4 (four) times daily.  Dispense: 100 g; Refill: 3  9. Hemiparesis affecting nondominant 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 ([place])  Left upper and lower extremity hemiparesis, strength upper is 3/5 and lower is 4/5  I have recommended she get a life alert since she continues to stay home alone.         [name], FNP

## 2018-07-25 ENCOUNTER — Telehealth: Payer: Self-pay

## 2018-07-25 LAB — CMP14 + ANION GAP
ALT: 14 IU/L (ref 0–32)
ANION GAP: 16 mmol/L (ref 10.0–18.0)
AST: 20 IU/L (ref 0–40)
Albumin/Globulin Ratio: 1.7 (ref 1.2–2.2)
Albumin: 4.6 g/dL (ref 3.6–4.8)
Alkaline Phosphatase: 103 IU/L (ref 39–117)
BUN/Creatinine Ratio: 18 (ref 12–28)
BUN: 18 mg/dL (ref 8–27)
Bilirubin Total: 0.3 mg/dL (ref 0.0–1.2)
CO2: 23 mmol/L (ref 20–29)
CREATININE: 1.02 mg/dL — AB (ref 0.57–1.00)
Calcium: 10.2 mg/dL (ref 8.7–10.3)
Chloride: 107 mmol/L — ABNORMAL HIGH (ref 96–106)
GFR calc Af Amer: 69 mL/min/{1.73_m2} (ref >59)
GFR calc non Af Amer: 60 mL/min/{1.73_m2} (ref >59)
Globulin, Total: 2.7 g/dL (ref 1.5–4.5)
Glucose: 72 mg/dL (ref 65–99)
POTASSIUM: 4 mmol/L (ref 3.5–5.2)
Sodium: 146 mmol/L — ABNORMAL HIGH (ref 134–144)
Total Protein: 7.3 g/dL (ref 6.0–8.5)

## 2018-07-25 LAB — LIPID PANEL
CHOL/HDL RATIO: 3 ratio (ref 0.0–4.4)
Cholesterol, Total: 173 mg/dL (ref 100–199)
HDL: 58 mg/dL (ref >39)
LDL Calculated: 93 mg/dL (ref 0–99)
TRIGLYCERIDES: 110 mg/dL (ref 0–149)
VLDL Cholesterol Cal: 22 mg/dL (ref 5–40)

## 2018-07-25 LAB — URIC ACID: Uric Acid: 5.5 mg/dL (ref 2.5–7.1)

## 2018-07-25 LAB — CBC
HEMATOCRIT: 39.1 % (ref 34.0–46.6)
HEMOGLOBIN: 11.5 g/dL (ref 11.1–15.9)
MCH: 22.3 pg — AB (ref 26.6–33.0)
MCHC: 29.4 g/dL — AB (ref 31.5–35.7)
MCV: 76 fL — ABNORMAL LOW (ref 79–97)
Platelets: 297 10*3/uL (ref 150–450)
RBC: 5.16 x10E6/uL (ref 3.77–5.28)
RDW: 15.5 % — ABNORMAL HIGH (ref 12.3–15.4)
WBC: 7.2 10*3/uL (ref 3.4–10.8)

## 2018-07-25 LAB — HEMOGLOBIN A1C
Est. average glucose Bld gHb Est-mCnc: 123 mg/dL
Hgb A1c MFr Bld: 5.9 % — ABNORMAL HIGH (ref 4.8–5.6)

## 2018-07-25 NOTE — Telephone Encounter (Signed)
Pt needs lab results

## 2018-07-25 NOTE — Telephone Encounter (Signed)
-----   Message from [name], [place] sent at 07/25/2018  9:15 AM EST ----- Your uric acid level is normal.  Kidney functions are slightly elevated you need to make sure you are drinking at least 4-5 bottles of water daily.  HgbA1c is stable at 5.9 limit intake of sugary foods and drinks.  Cholesterol levels are normal.  I am adding an iron studies due to your chronic anemia.

## 2018-07-26 LAB — IRON,TIBC AND FERRITIN PANEL
FERRITIN: 77 ng/mL (ref 15–150)
Iron Saturation: 33 % (ref 15–55)
Iron: 103 ug/dL (ref 27–139)
Total Iron Binding Capacity: 314 ug/dL (ref 250–450)
UIBC: 211 ug/dL (ref 118–369)

## 2018-07-26 LAB — SPECIMEN STATUS REPORT

## 2018-07-31 DIAGNOSIS — I69359 Hemiplegia and hemiparesis following cerebral infarction affecting unspecified side: Secondary | ICD-10-CM | POA: Insufficient documentation

## 2018-07-31 DIAGNOSIS — M1A9XX0 Chronic gout, unspecified, without tophus (tophi): Secondary | ICD-10-CM | POA: Insufficient documentation

## 2018-07-31 DIAGNOSIS — M791 Myalgia, unspecified site: Secondary | ICD-10-CM | POA: Insufficient documentation

## 2018-07-31 DIAGNOSIS — H547 Unspecified visual loss: Secondary | ICD-10-CM | POA: Insufficient documentation

## 2018-07-31 DIAGNOSIS — R7303 Prediabetes: Secondary | ICD-10-CM | POA: Insufficient documentation

## 2018-08-01 ENCOUNTER — Other Ambulatory Visit: Payer: Self-pay | Admitting: Nurse Practitioner

## 2018-08-08 ENCOUNTER — Ambulatory Visit: Payer: Medicaid Other | Admitting: Adult Health

## 2018-08-08 ENCOUNTER — Telehealth: Payer: Self-pay

## 2018-08-08 NOTE — Telephone Encounter (Signed)
Patient was a no call/no show their appointment today.   

## 2018-08-08 NOTE — Progress Notes (Deleted)
[facility] [address] [place]. [place] [address] [phone]       OFFICE FOLLOW UP NOTE  [name] Date of Birth:  Nov 18, 1955 Medical Record Number:  [id number]   Reason for Referral:  hospital stroke follow up  CHIEF COMPLAINT:  No chief complaint on file.   HPI: Kimberly [name] is being seen today in the office for right PCA infarct due to right PCA occlusion with embolic pattern secondary to newly diagnosed atrial fibrillation on 02/13/2018. History obtained from patient and chart review. Reviewed all radiology images and labs personally.  [name] is a 62 y.o. female with history of CAD s/p CABG in 2008, stroke in 2009 with right thalamic/IC infarct, chronic right ICA occlusion, HTN, HLD who was admitted for dizziness, fall and left hemianopia. No tPA given due to OSW.    CTA head and neck showed bilateral ICA occluded at origin, right PCA occlusion, VA irregularity in bilateral MCAs patent from collaterals.  Carotid Doppler showed bilateral ICA occlusion and possible left ICA fresh clot.  MRI head reviewed and showed large right PCA infarct with edema and petechial hemorrhage, bilateral ICA occlusion and old bilateral left greater than right MCA infarcts.  2D echo showing EF of 60 to 65%.  TCD was limited study but showed left ophthalmic a and right PCA flow reversal which was consistent with bilateral carotid system occlusion.  During admission, patient had A. fib and RVR and spontaneously reversed back to normal sinus rhythm.  Due to newly diagnosed A. fib, recommended DAPT for 5 to 7 days post stroke and recommended to start Eliquis along with continuation of aspirin.  LDL 100 and as patient was on Crestor PTA recommended to switch to Lipitor 80 mg.  HTN stable during admission and recommended BP goal of 1 30-1 60 given bilateral ICA occlusion.  Patient was discharged to SNF for continued deficits.   03/28/2018 visit: Patient is being seen today  for hospital stroke follow-up and is accompanied by her sister.  She continues to have left hemiparesis (chronic from previous stroke) along with vision loss.  Patient states that she is unable to see from her left eye and blurry with her right eye.  She was seen by ophthalmology and she states she was told this is due from her stroke.  She continues to take aspirin and Eliquis with bruising but no bleeding.  She continues to take Lipitor without side effects of myalgias.  Patient is participating in physical therapy at [facility] and rehab center where she is currently residing.  She plans on returning home but due to consistent falling, per her sister, they keep extending her stay.  Blood pressure elevated at 160/102 and per sister, it is typically lower at facility.  She complains of pain in her left shoulder, ribs and right hip.  She was recently seen in the ED for these pain complaints and it was determined that there were most likely residual pain from her falls as all imaging was negative.  She currently is using lidocaine patches along with Norco and Tylenol.  Denies new or worsening stroke/TIA symptoms.  Interval history 08/08/2018: Patient is being seen today for follow-up visit.  She has returned home on 06/11/2018.  She is receiving home PT/OT and home health nurse.  She continues on aspirin and Eliquis with mild bruising but no bleeding.  Continues to take atorvastatin without side effects myalgias.  Blood pressure today ***.    ROS:  14 system review of systems performed and negative with exception of vision loss and weakness  PMH:  Past Medical History:  Diagnosis Date  . Bicipital tenosynovitis   . Coronary artery disease 05/2007   [id number]   . Fall 02/13/2018  . Family history of ischemic heart disease   . Gout   . Hammer toe   . Hip, thigh, leg, and ankle, insect bite, nonvenomous, without mention of infection(916.4)   . Hyperlipidemia   . Hypertension   . Other  abnormality of red blood cells   . Rotator cuff syndrome of left shoulder   . Stroke [facility])     PSH:  Past Surgical History:  Procedure Laterality Date  . CARDIAC CATHETERIZATION    . CORONARY ARTERY BYPASS GRAFT     triple  . CORONARY ARTERY BYPASS GRAFT  2008  . TUBAL LIGATION      Social History:  Social History   Socioeconomic History  . Marital status: Single    Spouse name: Not on file  . Number of children: Not on file  . Years of education: Not on file  . Highest education level: Not on file  Occupational History  . Not on file  Social Needs  . Financial resource strain: Not on file  . Food insecurity:    Worry: Not on file    Inability: Not on file  . Transportation needs:    Medical: Not on file    Non-medical: Not on file  Tobacco Use  . Smoking status: Light Tobacco Smoker    Types: Cigarettes  . Smokeless tobacco: Former Systems developer    Quit date: 08/09/2008  Substance and Sexual Activity  . Alcohol use: Yes    Alcohol/week: 1.0 standard drinks    Types: 1 Cans of beer per week  . Drug use: Yes    Types: Marijuana    Comment: history of cocaine use  . Sexual activity: Not on file  Lifestyle  . Physical activity:    Days per week: Not on file    Minutes per session: Not on file  . Stress: Not on file  Relationships  . Social connections:    Talks on phone: Not on file    Gets together: Not on file    Attends religious service: Not on file    Active member of club or organization: Not on file    Attends meetings of clubs or organizations: Not on file    Relationship status: Not on file  . Intimate partner violence:    Fear of current or ex partner: Not on file    Emotionally abused: Not on file    Physically abused: Not on file    Forced sexual activity: Not on file  Other Topics Concern  . Not on file  Social History Narrative  . Not on file    Family History:  Family History  Problem Relation Age of Onset  . Hypertension Mother   . Heart  attack Father   . Diabetes Father   . Anxiety disorder Sister   . Sarcoidosis Sister     Medications:   Current Outpatient Medications on File Prior to Visit  Medication Sig Dispense Refill  . acetaminophen (TYLENOL) 500 MG tablet Take 1,000 mg by mouth every 6 (six) hours as needed for moderate pain.    [name] amLODipine (NORVASC) 10 MG tablet TAKE 1 TABLET BY MOUTH EVERY DAY 90 tablet 1  . apixaban (ELIQUIS) 5 MG TABS tablet Take 1 tablet (  5 mg total) by mouth 2 (two) times daily. Start on 02/20/2018, stop Plavix when you start this medication. 60 tablet 0  . aspirin 81 MG tablet Take 81 mg by mouth daily.      [name] atorvastatin (LIPITOR) 80 MG tablet TAKE 1 TABLET BY MOUTH EVERY DAY 30 tablet 0  . Blood Pressure Monitoring (B-D ASSURE BPM/AUTO ARM CUFF) MISC 1 Device by Does not apply route once. 1 each 0  . chlorthalidone (HYGROTON) 25 MG tablet TAKE 1 TABLET BY MOUTH DAILY 90 tablet 1  . colchicine 0.6 MG tablet Take 1 tablet (0.6 mg total) by mouth 2 (two) times daily. 60 tablet 0  . diclofenac sodium (VOLTAREN) 1 % GEL Apply 2 g topically 4 (four) times daily. 100 g 3  . feeding supplement, ENSURE COMPLETE, (ENSURE COMPLETE) LIQD Take 237 mLs by mouth 2 (two) times daily between meals. 30 Bottle 5  . HYDROcodone-acetaminophen (NORCO/VICODIN) 5-325 MG tablet Take 1 tablet by mouth every 6 (six) hours as needed for moderate pain.    [name] lidocaine (LIDODERM) 5 % Place 1 patch onto the skin daily. Remove & Discard patch within 12 hours or as directed by MD    . lisinopril (PRINIVIL,ZESTRIL) 40 MG tablet TAKE 1 TABLET BY MOUTH DAILY 90 tablet 1  . MITIGARE 0.6 MG CAPS TAKE 1 CAPSULE BY MOUTH TWICE DAILY 90 capsule 1  . Multiple Vitamins-Minerals (MULTIVITAMIN WITH MINERALS) tablet Take 1 tablet by mouth daily.      . nicotine (NICODERM CQ - DOSED IN MG/24 HOURS) 21 mg/24hr patch Place 21 mg onto the skin daily.    [name] omeprazole (PRILOSEC) 20 MG capsule TAKE 1 CAPSULE BY MOUTH EVERY DAY 90 capsule 1    No current facility-administered medications on file prior to visit.     Allergies:   Allergies  Allergen Reactions  . Clonidine Derivatives Other (See Comments)    Patch causes scars  . Spironolactone Rash     Physical Exam  There were no vitals filed for this visit. There is no height or weight on file to calculate BMI. No exam data present  General: Frail elderly African-American female, seated, in no evident distress Head: head normocephalic and atraumatic.   Neck: supple with no carotid or supraclavicular bruits Cardiovascular: regular rate and rhythm, no murmurs Musculoskeletal: no deformity Skin:  no rash/petichiae Vascular:  Normal pulses all extremities  Neurologic Exam Mental Status: Awake and fully alert. Oriented to place and time. Recent and remote memory intact. Attention span, concentration and fund of knowledge appropriate. Mood and affect appropriate.  Cranial Nerves: Fundoscopic exam reveals sharp disc margins. Pupils equal, briskly reactive to light. Extraocular movements full without nystagmus. Visual fields unable to accurately assess.  Hearing intact. Facial sensation intact. Face, tongue, palate moves normally and symmetrically.  Motor: Normal bulk and tone. Normal strength in the right upper and lower extremity but weakness noted in left upper and lower extremity but exam limited due to pain. Sensory.: intact to touch , pinprick , position and vibratory sensation.  Coordination: Rapid alternating movements normal in all extremities. Finger-to-nose and heel-to-shin performed accurately bilaterally. Gait and Station: Patient currently sitting in wheelchair and states she occasionally ambulates with physical therapy Reflexes: 1+ and symmetric. Toes downgoing.    NIHSS  1 Modified Rankin  3   Diagnostic Data (Labs, Imaging, Testing)  CT head without contrast 02/13/2018 IMPRESSION: 1. There is a moderate to large area of low attenuation involving the  right temporal lobe  and right occipital lobe compatible with an acute or subacute right PCA infarct. No hemorrhage identified and no significant mass effect. 2. Chronic left frontal lobe infarct with progressive global atrophy and chronic small vessel ischemic change. 3. No evidence for cervical spine fracture or dislocation. 4. Cervical spondylosis.  CTA head/neck with and without contrast 02/14/2018 IMPRESSION: Occlusion of both internal carotid arteries at their origins. 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 Left carotid occlusion is newly seen since the previous study of 2009. Reconstituted flow in the supraclinoid internal carotid arteries due to patent posterior communicating arteries and external to internal collaterals. Atherosclerotic irregularity of the anterior and middle cerebral branches, but with flow present. No vertebral stenosis. Atherosclerotic irregularity of the basilar but without flow limiting stenosis. Occlusion of the right PCA just beyond its origin, with more distal flow probably deriving from collateral vessels.  MR brain without contrast 02/14/2018 IMPRESSION: 1. Large acute right PCA territory infarct concordant with the right PCA occlusion demonstrated by CTA. Cytotoxic edema and petechial hemorrhage. No malignant hemorrhagic transformation or significant mass effect. 2. No other acute intracranial abnormality. 3. 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  Carotid Doppler   Unable to visualize flow in bilateral internal carotid arteries by color, power, and pulsed wave Doppler, therefore findings are suggestive of bilateral internal carotid artery occlusion.Left proximal internal carotid artery exhibits echogenic material, with an anechoic lumen distal to this area. This is suggestive of possible thrombus/embolic material. Bilateral external carotid  arteries are patent with low-resistant flow. Bilateral vertebral arteries are patent with low-resistant flow.  TTE  - Left ventricle: The cavity size was normal. Wall thickness wasincreased in a pattern of moderate LVH. Systolic function wasnormal. The estimated ejection fraction was in the range of 60%to 65%. There is hypokinesis of the basalinferolateral andinferior myocardium. Indeterminate diastolic function. - Aortic valve: Mildly calcified annulus. Trileaflet. - Aorta: Ascending aortic diameter: 44 mm (S). - Mitral valve: Mildly thickened leaflets. There was mildregurgitation. - Left atrium: The atrium was mildly to moderately dilated. - Right atrium: The atrium was mildly dilated. Central venouspressure (est): 3 mm Hg. - Atrial septum: No defect or patent foramen ovale was identified. - Tricuspid valve: There was mild regurgitation. - Pulmonary arteries: PA peak pressure: 31 mm Hg (S). - Pericardium, extracardiac: There was no pericardial effusion.  TCD A very technically limited and difficult study due to continuous patient vocalization and inability to stay still. unable to insonate posterior circulation bilaterally via transoccpital window, right anterior circulation via transtemporal window as well  as limited view of right transorbital window. Mean flow velocities were decreased in left MCA, ACA, terminal ICA, PCA indicating proximal left carotid occlusion or severe stenosis. Flow reversal at left opthalmic artery consistent with left carotid systme high grade stenosis or occlusion. Right ACA flow reveral indicates left to right intracranial collateralization, suggesting right carotid system high grade stenosis or occlusion. Bi-directional left ICA siphon and antegrade flow at right ICA siphon may be explained by posterior to anterior collateralization from bilateral PCOMs. cerebral vascular imaging studies such as CTA or MRA head, or catheter-based cerebral angiography for  further evaluation are highly recomended.    ASSESSMENT: Kimberly [name] is a 62 y.o. year old female here with right PCA infarcts on 02/13/2018 secondary to right PCA occlusion secondary to newly diagnosed A. fib. Vascular risk factors include CAD, right ICA occlusion, HTN and HLD.  PLAN: -Continue aspirin 81 mg daily and Eliquis (apixaban) daily  and lipitor  for secondary stroke prevention -F/u with PCP regarding your HLD and HTN management -f/u with cardiologist regarding AF management -continue to monitor BP at home -cotinue current therapies for deficits and minimize risk of falls -Maintain strict control of hypertension with blood pressure goal below 130/90, diabetes with hemoglobin A1c goal below 6.5% and cholesterol with LDL cholesterol (bad cholesterol) goal below 70 mg/dL. I also advised the patient to eat a healthy diet with plenty of whole grains, cereals, fruits and vegetables, exercise regularly and maintain ideal body weight.  Follow up in 3 months or call earlier if needed   Greater than 50% of time during this 25 minute visit was spent on counseling,explanation of diagnosis of right PCA infarcts, reviewing risk factor management of HLD, HTN and AF, planning of further management, discussion with patient and family and coordination of care    Venancio Poisson, [facility]  [facility] [address] [place], [place] [address]  Phone [phone] Fax [phone]

## 2018-08-09 ENCOUNTER — Telehealth: Payer: Self-pay | Admitting: Adult Health

## 2018-08-09 NOTE — Telephone Encounter (Signed)
FYI- patient has had 3 no-shows in 2019.

## 2018-08-09 NOTE — Telephone Encounter (Signed)
Requesting patient be discharged from clinic as she has had 3 no-shows.

## 2018-08-12 ENCOUNTER — Encounter: Payer: Self-pay | Admitting: Adult Health

## 2018-08-12 NOTE — Telephone Encounter (Signed)
Pt also no show in August 2019.

## 2018-08-23 ENCOUNTER — Other Ambulatory Visit: Payer: Self-pay | Admitting: Nurse Practitioner

## 2018-08-23 ENCOUNTER — Telehealth: Payer: Self-pay

## 2018-08-23 DIAGNOSIS — I639 Cerebral infarction, unspecified: Secondary | ICD-10-CM

## 2018-08-23 NOTE — Telephone Encounter (Signed)
Attempted to call pt to advise her that her ins just doesn't cover the voltaren gel we can send her a cream to a specialty pharmacy but the cream wil be $40 if not she can try a pain cream OTC or we can send her some gabapentin but it is not a cream and it will make her sleepy. Left pt v/m to call office [facility]

## 2018-08-26 ENCOUNTER — Other Ambulatory Visit: Payer: Self-pay | Admitting: Nurse Practitioner

## 2018-08-26 ENCOUNTER — Encounter: Payer: Self-pay | Admitting: Neurology

## 2018-08-26 ENCOUNTER — Telehealth: Payer: Self-pay

## 2018-08-26 DIAGNOSIS — I639 Cerebral infarction, unspecified: Secondary | ICD-10-CM

## 2018-08-26 NOTE — Progress Notes (Unsigned)
re

## 2018-08-26 NOTE — Telephone Encounter (Signed)
I spoke with pt advised her that the Rx for a shower chair has already been completed and she should hear back from them soon also advised her that her ins does not cover the voltaren gel but she can try a cream OTC or we can send her some gabapentin per [name] FNP-BC pt stated she does not want pills because she takes enough as is pt stated she wil try a cream OTC. Patient also stated she needs a new referral to a neurologist due to her missing too many appointments at the last neurologist you referred her to. YRL,RMA

## 2018-08-28 ENCOUNTER — Ambulatory Visit: Payer: Medicaid Other | Admitting: Cardiology

## 2018-09-04 ENCOUNTER — Other Ambulatory Visit: Payer: Self-pay | Admitting: Nurse Practitioner

## 2018-09-04 DIAGNOSIS — I4891 Unspecified atrial fibrillation: Secondary | ICD-10-CM

## 2018-09-04 DIAGNOSIS — I639 Cerebral infarction, unspecified: Secondary | ICD-10-CM

## 2018-09-04 NOTE — Progress Notes (Signed)
kin

## 2018-09-25 ENCOUNTER — Ambulatory Visit: Payer: Medicaid Other | Admitting: Cardiology

## 2018-09-25 NOTE — Progress Notes (Deleted)
HPI The patient presents as a new patient for follow up of CAD/CABG.   She was in the hospital with right PCA subacute infarct thought to be related to PAF last year.  Carotid Doppler showed complete occlusion of bilateral internal carotid artery, and a subsequent CT angiogram of the neck confirmed bilateral internal carotid arteries at the origins, however these appeared to have reconstituted flow in the supraclinoid intracranial arteries from collaterals/posterior communicating arteries. TTE showspreserved EF without any embolic source.    Since I last saw her ***  I reviewed these records for this visit.   She is in rehab at a nursing home.  She gets around for the most part in wheelchair but can ambulate with a walker.  She has left-sided weakness.  She has some visual changes.  She is not noticing any atrial fibrillation or palpitations.  She is not having any chest discomfort, neck or arm discomfort.  She has no shortness of breath, PND or orthopnea.  She has some numbness and tingling in her extremities.   Allergies  Allergen Reactions  . Clonidine Derivatives Other (See Comments)    Patch causes scars  . Spironolactone Rash    Current Outpatient Medications  Medication Sig Dispense Refill  . acetaminophen (TYLENOL) 500 MG tablet Take 1,000 mg by mouth every 6 (six) hours as needed for moderate pain.    [name] amLODipine (NORVASC) 10 MG tablet TAKE 1 TABLET BY MOUTH EVERY DAY 90 tablet 1  . apixaban (ELIQUIS) 5 MG TABS tablet Take 1 tablet (5 mg total) by mouth 2 (two) times daily. Start on 02/20/2018, stop Plavix when you start this medication. 60 tablet 0  . aspirin 81 MG tablet Take 81 mg by mouth daily.      [name] atorvastatin (LIPITOR) 80 MG tablet TAKE 1 TABLET BY MOUTH EVERY DAY 30 tablet 0  . Blood Pressure Monitoring (B-D ASSURE BPM/AUTO ARM CUFF) MISC 1 Device by Does not apply route once. 1 each 0  . chlorthalidone (HYGROTON) 25 MG tablet TAKE 1 TABLET BY MOUTH DAILY 90 tablet  1  . colchicine 0.6 MG tablet Take 1 tablet (0.6 mg total) by mouth 2 (two) times daily. 60 tablet 0  . diclofenac sodium (VOLTAREN) 1 % GEL Apply 2 g topically 4 (four) times daily. 100 g 3  . feeding supplement, ENSURE COMPLETE, (ENSURE COMPLETE) LIQD Take 237 mLs by mouth 2 (two) times daily between meals. 30 Bottle 5  . HYDROcodone-acetaminophen (NORCO/VICODIN) 5-325 MG tablet Take 1 tablet by mouth every 6 (six) hours as needed for moderate pain.    [name] lidocaine (LIDODERM) 5 % Place 1 patch onto the skin daily. Remove & Discard patch within 12 hours or as directed by MD    . lisinopril (PRINIVIL,ZESTRIL) 40 MG tablet TAKE 1 TABLET BY MOUTH DAILY 90 tablet 1  . MITIGARE 0.6 MG CAPS TAKE 1 CAPSULE BY MOUTH TWICE DAILY 90 capsule 1  . Multiple Vitamins-Minerals (MULTIVITAMIN WITH MINERALS) tablet Take 1 tablet by mouth daily.      . nicotine (NICODERM CQ - DOSED IN MG/24 HOURS) 21 mg/24hr patch Place 21 mg onto the skin daily.    [name] omeprazole (PRILOSEC) 20 MG capsule TAKE 1 CAPSULE BY MOUTH EVERY DAY 90 capsule 1   No current facility-administered medications for this visit.     Past Medical History:  Diagnosis Date  . Bicipital tenosynovitis   . Coronary artery disease 05/2007   [id number]   . Fall 02/13/2018  .  Family history of ischemic heart disease   . Gout   . Hammer toe   . Hip, thigh, leg, and ankle, insect bite, nonvenomous, without mention of infection(916.4)   . Hyperlipidemia   . Hypertension   . Other abnormality of red blood cells   . Rotator cuff syndrome of left shoulder   . Stroke [facility])     Past Surgical History:  Procedure Laterality Date  . CARDIAC CATHETERIZATION    . CORONARY ARTERY BYPASS GRAFT     triple  . CORONARY ARTERY BYPASS GRAFT  2008  . TUBAL LIGATION      ROS:   ***  PHYSICAL EXAM There were no vitals taken for this visit.  GEN:  No distress NECK:  No jugular venous distention at 90 degrees, waveform within normal limits, carotid upstroke  brisk and symmetric, no bruits, no thyromegaly LYMPHATICS:  No cervical adenopathy LUNGS:  Clear to auscultation bilaterally BACK:  No CVA tenderness CHEST:  Unremarkable HEART:  S1 and S2 within normal limits, no S3, no S4, no clicks, no rubs, *** murmurs ABD:  Positive bowel sounds normal in frequency in pitch, no bruits, no rebound, no guarding, unable to assess midline mass or bruit with the patient seated. EXT:  2 plus pulses throughout, moderate edema, no cyanosis no clubbing NEURO:  Left-sided upper and lower extremity weakness     ***GEN:  No distress NECK:  No jugular venous distention at 90 degrees, waveform within normal limits, carotid upstroke brisk and symmetric, no bruits, no thyromegaly LUNGS:  Clear to auscultation bilaterally CHEST:  Unremarkable HEART:  S1 and S2 within normal limits, no S3, no S4, no clicks, no rubs, 2 out of 6 apical brief systolic murmur distant heart sounds, no diastolic murmurs ABD:  Positive bowel sounds normal in frequency in pitch, no bruits, no rebound, no guarding, unable to assess midline mass or bruit with the patient seated. EXT:  2 plus pulses throughout, moderate edema, no cyanosis no clubbing NEURO: Left-sided upper and lower extremity weakness    EKG:  ***   09/25/2018   Lab Results  Component Value Date   CHOL 173 07/24/2018   TRIG 110 07/24/2018   HDL 58 07/24/2018   LDLCALC 93 07/24/2018    ASSESSMENT AND PLAN   CAD:  *** The patient has no new sypmtoms.  No further cardiovascular testing is indicated.  We will continue with aggressive risk reduction and meds as listed.    HTN:  ***   Her blood pressure is not controlled.  I am getting a chlorthalidone 25 mg daily.  She is had normal renal function and high potassium in the past so she should tolerate this with increased potassium in her diet.   She will have a follow up BMET in 2 weeks.    CAROTID STENOSIS:  ***  She has bilateral carotid occlusion and will be managed  medically.  No change in therapy.   PAF:  Ms. Amybeth Sieg has a CHA2DS2 - VASc score of 4.  ***  I would like to apply an event monitor to see the burden of atrial fibrillation.  She was sinus rhythm when she left the hospital and in fact sinus rhythm after the initial presentation in the emergency room.[name]    HYPERLIPIDEMIA:  ***  Recent lipids as above.  Continue current therapy.

## 2018-09-26 ENCOUNTER — Ambulatory Visit: Payer: Medicaid Other | Admitting: Cardiology

## 2018-09-26 DIAGNOSIS — H547 Unspecified visual loss: Secondary | ICD-10-CM

## 2018-09-26 DIAGNOSIS — M47812 Spondylosis without myelopathy or radiculopathy, cervical region: Secondary | ICD-10-CM

## 2018-09-26 DIAGNOSIS — H53462 Homonymous bilateral field defects, left side: Secondary | ICD-10-CM

## 2018-09-26 DIAGNOSIS — I480 Paroxysmal atrial fibrillation: Secondary | ICD-10-CM

## 2018-09-26 DIAGNOSIS — I69354 Hemiplegia and hemiparesis following cerebral infarction affecting left non-dominant side: Secondary | ICD-10-CM

## 2018-09-26 DIAGNOSIS — I422 Other hypertrophic cardiomyopathy: Secondary | ICD-10-CM

## 2018-09-26 DIAGNOSIS — I10 Essential (primary) hypertension: Secondary | ICD-10-CM

## 2018-09-26 DIAGNOSIS — I712 Thoracic aortic aneurysm, without rupture: Secondary | ICD-10-CM

## 2018-09-26 DIAGNOSIS — I2510 Atherosclerotic heart disease of native coronary artery without angina pectoris: Secondary | ICD-10-CM

## 2018-09-26 DIAGNOSIS — M1A9XX0 Chronic gout, unspecified, without tophus (tophi): Secondary | ICD-10-CM

## 2018-09-26 DIAGNOSIS — I6522 Occlusion and stenosis of left carotid artery: Secondary | ICD-10-CM

## 2018-09-26 DIAGNOSIS — I700 Atherosclerosis of aorta: Secondary | ICD-10-CM

## 2018-09-27 ENCOUNTER — Encounter: Payer: Self-pay | Admitting: *Deleted

## 2018-10-02 ENCOUNTER — Encounter: Payer: Self-pay | Admitting: Neurology

## 2018-10-03 ENCOUNTER — Other Ambulatory Visit: Payer: Self-pay | Admitting: Nurse Practitioner

## 2018-10-04 ENCOUNTER — Other Ambulatory Visit: Payer: Self-pay | Admitting: Nurse Practitioner

## 2018-10-04 MED ORDER — LIDOCAINE 5 % EX PTCH: 1.0000 | MEDICATED_PATCH | Freq: Two times a day (BID) | CUTANEOUS | 0 refills | Status: DC

## 2018-10-04 MED ORDER — LIDOCAINE 5 % EX PTCH: 1.0000 | MEDICATED_PATCH | Freq: Two times a day (BID) | CUTANEOUS | 3 refills | Status: DC

## 2018-10-09 NOTE — Progress Notes (Signed)
HPI The patient presents as a new patient for follow up of CAD/CABG.   She was in the hospital with right PCA subacute infarct thought to be related to PAF last year.  Carotid Doppler showed complete occlusion of bilateral internal carotid artery, and a subsequent CT angiogram of the neck confirmed bilateral internal carotid arteries at the origins, however these appeared to have reconstituted flow in the supraclinoid intracranial arteries from collaterals/posterior communicating arteries. TTE showspreserved EF without any embolic source.    Since I last saw her she has done OK.  She gets around with a cane, walker or in a wheelchair with long distances.  The patient denies any new symptoms such as chest discomfort, neck or arm discomfort. There has been no new shortness of breath, PND or orthopnea. There have been no reported palpitations, presyncope or syncope.  She does have what sounds like neuropathic pain with cold extremities on the left side which was the side affected by the stroke.    Allergies  Allergen Reactions  . Clonidine Derivatives Other (See Comments)    Patch causes scars  . Spironolactone Rash    Current Outpatient Medications  Medication Sig Dispense Refill  . acetaminophen (TYLENOL) 500 MG tablet Take 1,000 mg by mouth every 6 (six) hours as needed for moderate pain.    [name] amLODipine (NORVASC) 10 MG tablet TAKE 1 TABLET BY MOUTH EVERY DAY 90 tablet 1  . apixaban (ELIQUIS) 5 MG TABS tablet Take 1 tablet (5 mg total) by mouth 2 (two) times daily. 180 tablet 3  . aspirin 81 MG tablet Take 81 mg by mouth daily.      [name] atorvastatin (LIPITOR) 80 MG tablet TAKE 1 TABLET BY MOUTH DAILY 90 tablet 1  . Blood Pressure Monitoring (B-D ASSURE BPM/AUTO ARM CUFF) MISC 1 Device by Does not apply route once. 1 each 0  . chlorthalidone (HYGROTON) 25 MG tablet TAKE 1 TABLET BY MOUTH DAILY 90 tablet 1  . diclofenac sodium (VOLTAREN) 1 % GEL Apply 2 g topically 4 (four) times daily. 100  g 3  . feeding supplement, ENSURE COMPLETE, (ENSURE COMPLETE) LIQD Take 237 mLs by mouth 2 (two) times daily between meals. 30 Bottle 5  . HYDROcodone-acetaminophen (NORCO/VICODIN) 5-325 MG tablet Take 1 tablet by mouth every 6 (six) hours as needed for moderate pain.    [name] lidocaine (LIDODERM) 5 % Place 1 patch onto the skin daily. Remove & Discard patch within 12 hours or as directed by MD    . lisinopril (PRINIVIL,ZESTRIL) 40 MG tablet TAKE 1 TABLET BY MOUTH DAILY 90 tablet 1  . MITIGARE 0.6 MG CAPS TAKE 1 CAPSULE BY MOUTH TWICE DAILY 90 capsule 1  . Multiple Vitamins-Minerals (MULTIVITAMIN WITH MINERALS) tablet Take 1 tablet by mouth daily.      . nicotine (NICODERM CQ - DOSED IN MG/24 HOURS) 21 mg/24hr patch Place 21 mg onto the skin daily.    [name] omeprazole (PRILOSEC) 20 MG capsule TAKE 1 CAPSULE BY MOUTH EVERY DAY 90 capsule 1   No current facility-administered medications for this visit.     Past Medical History:  Diagnosis Date  . Bicipital tenosynovitis   . Coronary artery disease 05/2007   [id number]   . Fall 02/13/2018  . Family history of ischemic heart disease   . Gout   . Hammer toe   . Hip, thigh, leg, and ankle, insect bite, nonvenomous, without mention of infection(916.4)   . Hyperlipidemia   . Hypertension   .  Other abnormality of red blood cells   . Rotator cuff syndrome of left shoulder   . Stroke [facility])     Past Surgical History:  Procedure Laterality Date  . CARDIAC CATHETERIZATION    . CORONARY ARTERY BYPASS GRAFT     triple  . CORONARY ARTERY BYPASS GRAFT  2008  . TUBAL LIGATION      ROS:   As stated in the HPI and negative for all other systems.  PHYSICAL EXAM BP (!) 140/94   Pulse 66   Ht 5\' 7"  (1.702 m)   Wt 127 lb (57.6 kg)   BMI 19.89 kg/m   GEN:  No distress NECK:  No jugular venous distention at 90 degrees, waveform within normal limits, carotid upstroke brisk and symmetric, no bruits, no thyromegaly LYMPHATICS:  No cervical adenopathy LUNGS:   Clear to auscultation bilaterally CHEST:  Unremarkable HEART:  S1 and S2 within normal limits, no S3, no S4, no clicks, no rubs, 2 out of 6 murmurs ABD:  Positive bowel sounds normal in frequency in pitch, no bruits, no rebound, no guarding, unable to assess midline mass or bruit with the patient seated. EXT:  2 plus pulses throughout, moderate edema, no cyanosis no clubbing SKIN:  No rashes no nodules NEURO: Left hemiparesis   EKG: Sinus rhythm, rate 66.  Axis within normal limits, intervals T wave inversion consistent with LVH repolarization changes.  No change from previous.   10/10/2018   Lab Results  Component Value Date   CHOL 173 07/24/2018   TRIG 110 07/24/2018   HDL 58 07/24/2018   LDLCALC 93 07/24/2018    ASSESSMENT AND PLAN   CAD:  The patient has no new sypmtoms.  No further cardiovascular testing is indicated.  We will continue with aggressive risk reduction and meds as listed.  HTN: Blood pressure is upper limits of normal.  No change in therapy.  Labs in Dec OK.    CAROTID STENOSIS:  She had bilateral carotid occlusion .  She will be managed medically.  PAF:  [name] has a CHA2DS2 - VASc score of 4.  No change in therapy.  HYPERLIPIDEMIA:    LDL was OK.  Continue current therapy.   TOBACCO ABUSE:    We talked again about the need to stop smoking.

## 2018-10-10 ENCOUNTER — Ambulatory Visit (INDEPENDENT_AMBULATORY_CARE_PROVIDER_SITE_OTHER): Payer: Medicaid Other | Admitting: Cardiology

## 2018-10-10 ENCOUNTER — Encounter: Payer: Self-pay | Admitting: Cardiology

## 2018-10-10 VITALS — BP 140/94 | HR 66 | Ht 67.0 in | Wt 127.0 lb

## 2018-10-10 DIAGNOSIS — Z720 Tobacco use: Secondary | ICD-10-CM | POA: Diagnosis not present

## 2018-10-10 DIAGNOSIS — I2510 Atherosclerotic heart disease of native coronary artery without angina pectoris: Secondary | ICD-10-CM | POA: Diagnosis not present

## 2018-10-10 DIAGNOSIS — I480 Paroxysmal atrial fibrillation: Secondary | ICD-10-CM | POA: Diagnosis not present

## 2018-10-10 DIAGNOSIS — I10 Essential (primary) hypertension: Secondary | ICD-10-CM

## 2018-10-10 MED ORDER — APIXABAN 5 MG PO TABS: 5.0000 mg | ORAL_TABLET | Freq: Two times a day (BID) | ORAL | 3 refills | Status: DC

## 2018-10-10 NOTE — Patient Instructions (Signed)
Medication Instructions:  Continue current medications  If you need a refill on your cardiac medications before your next appointment, please call your pharmacy.  Labwork: None Ordered   Testing/Procedures: None Ordered   Follow-Up: You will need a follow up appointment in 1 Year.  Please call our office 2 months in advance to schedule this appointment.  You may see Kimberly Hochrein, MD or one of the following Advanced Practice Providers on your designated Care Team:   Rhonda Barrett, PA-C . Kathryn Lawrence, DNP, ANP      At [facility], you and your health needs are our priority.  As part of our continuing mission to provide you with exceptional heart care, we have created designated Provider Care Teams.  These Care Teams include your primary Cardiologist (physician) and Advanced Practice Providers (APPs -  Physician Assistants and Nurse Practitioners) who all work together to provide you with the care you need, when you need it.  Thank you for choosing [facility] at [facility]!!     

## 2018-11-06 ENCOUNTER — Ambulatory Visit: Payer: Medicaid Other | Admitting: Neurology

## 2018-11-07 ENCOUNTER — Ambulatory Visit: Payer: Medicaid Other | Admitting: Neurology

## 2018-11-25 ENCOUNTER — Emergency Department (HOSPITAL_COMMUNITY): Payer: Medicaid Other

## 2018-11-25 ENCOUNTER — Inpatient Hospital Stay (HOSPITAL_COMMUNITY)
Admission: EM | Admit: 2018-11-25 | Discharge: 2018-11-28 | DRG: 392 | Disposition: A | Discharge status: Home Health | Payer: Medicaid Other | Attending: Internal Medicine | Admitting: Internal Medicine

## 2018-11-25 ENCOUNTER — Other Ambulatory Visit: Payer: Self-pay

## 2018-11-25 ENCOUNTER — Encounter (HOSPITAL_COMMUNITY): Payer: Self-pay | Admitting: *Deleted

## 2018-11-25 DIAGNOSIS — E876 Hypokalemia: Secondary | ICD-10-CM | POA: Diagnosis present

## 2018-11-25 DIAGNOSIS — I2510 Atherosclerotic heart disease of native coronary artery without angina pectoris: Secondary | ICD-10-CM | POA: Diagnosis present

## 2018-11-25 DIAGNOSIS — I69359 Hemiplegia and hemiparesis following cerebral infarction affecting unspecified side: Secondary | ICD-10-CM

## 2018-11-25 DIAGNOSIS — Z888 Allergy status to other drugs, medicaments and biological substances status: Secondary | ICD-10-CM

## 2018-11-25 DIAGNOSIS — Z7901 Long term (current) use of anticoagulants: Secondary | ICD-10-CM

## 2018-11-25 DIAGNOSIS — R197 Diarrhea, unspecified: Secondary | ICD-10-CM | POA: Diagnosis present

## 2018-11-25 DIAGNOSIS — N179 Acute kidney failure, unspecified: Secondary | ICD-10-CM | POA: Diagnosis present

## 2018-11-25 DIAGNOSIS — Z20828 Contact with and (suspected) exposure to other viral communicable diseases: Secondary | ICD-10-CM | POA: Diagnosis present

## 2018-11-25 DIAGNOSIS — M2040 Other hammer toe(s) (acquired), unspecified foot: Secondary | ICD-10-CM | POA: Diagnosis present

## 2018-11-25 DIAGNOSIS — I9589 Other hypotension: Secondary | ICD-10-CM | POA: Diagnosis present

## 2018-11-25 DIAGNOSIS — I69354 Hemiplegia and hemiparesis following cerebral infarction affecting left non-dominant side: Secondary | ICD-10-CM

## 2018-11-25 DIAGNOSIS — I10 Essential (primary) hypertension: Secondary | ICD-10-CM | POA: Diagnosis present

## 2018-11-25 DIAGNOSIS — Z951 Presence of aortocoronary bypass graft: Secondary | ICD-10-CM

## 2018-11-25 DIAGNOSIS — Z8249 Family history of ischemic heart disease and other diseases of the circulatory system: Secondary | ICD-10-CM

## 2018-11-25 DIAGNOSIS — E785 Hyperlipidemia, unspecified: Secondary | ICD-10-CM | POA: Diagnosis present

## 2018-11-25 DIAGNOSIS — R112 Nausea with vomiting, unspecified: Secondary | ICD-10-CM

## 2018-11-25 DIAGNOSIS — Z9181 History of falling: Secondary | ICD-10-CM

## 2018-11-25 DIAGNOSIS — Z79899 Other long term (current) drug therapy: Secondary | ICD-10-CM

## 2018-11-25 DIAGNOSIS — E860 Dehydration: Secondary | ICD-10-CM | POA: Diagnosis present

## 2018-11-25 DIAGNOSIS — I4891 Unspecified atrial fibrillation: Secondary | ICD-10-CM | POA: Diagnosis not present

## 2018-11-25 DIAGNOSIS — M109 Gout, unspecified: Secondary | ICD-10-CM | POA: Diagnosis present

## 2018-11-25 DIAGNOSIS — F17210 Nicotine dependence, cigarettes, uncomplicated: Secondary | ICD-10-CM | POA: Diagnosis present

## 2018-11-25 DIAGNOSIS — E861 Hypovolemia: Secondary | ICD-10-CM | POA: Diagnosis present

## 2018-11-25 DIAGNOSIS — A084 Viral intestinal infection, unspecified: Principal | ICD-10-CM | POA: Diagnosis present

## 2018-11-25 DIAGNOSIS — Z7982 Long term (current) use of aspirin: Secondary | ICD-10-CM

## 2018-11-25 LAB — LACTIC ACID, PLASMA
Lactic Acid, Venous: 2.3 mmol/L (ref 0.5–1.9)
Lactic Acid, Venous: 0.8 mmol/L (ref 0.5–1.9)
Lactic Acid, Venous: 0.8 mmol/L (ref 0.5–1.9)

## 2018-11-25 LAB — CBC WITH DIFFERENTIAL/PLATELET
Abs Immature Granulocytes: 0.02 10*3/uL (ref 0.00–0.07)
Basophils Absolute: 0 10*3/uL (ref 0.0–0.1)
Basophils Relative: 0 %
Eosinophils Absolute: 0.1 10*3/uL (ref 0.0–0.5)
Eosinophils Relative: 1 %
HCT: 40.2 % (ref 36.0–46.0)
Hemoglobin: 11.7 g/dL — ABNORMAL LOW (ref 12.0–15.0)
Immature Granulocytes: 0 %
Lymphocytes Relative: 29 %
Lymphs Abs: 2.2 10*3/uL (ref 0.7–4.0)
MCH: 21.6 pg — ABNORMAL LOW (ref 26.0–34.0)
MCHC: 29.1 g/dL — ABNORMAL LOW (ref 30.0–36.0)
MCV: 74.2 fL — ABNORMAL LOW (ref 80.0–100.0)
Monocytes Absolute: 0.5 10*3/uL (ref 0.1–1.0)
Monocytes Relative: 6 %
Neutro Abs: 4.8 10*3/uL (ref 1.7–7.7)
Neutrophils Relative %: 64 %
Platelets: 209 10*3/uL (ref 150–400)
RBC: 5.42 MIL/uL — ABNORMAL HIGH (ref 3.87–5.11)
RDW: 16.8 % — ABNORMAL HIGH (ref 11.5–15.5)
WBC: 7.5 10*3/uL (ref 4.0–10.5)
nRBC: 0 % (ref 0.0–0.2)

## 2018-11-25 LAB — COMPREHENSIVE METABOLIC PANEL
ALT: 26 U/L (ref 0–44)
AST: 26 U/L (ref 15–41)
Albumin: 4.1 g/dL (ref 3.5–5.0)
Alkaline Phosphatase: 99 U/L (ref 38–126)
Anion gap: 12 (ref 5–15)
BUN: 28 mg/dL — ABNORMAL HIGH (ref 8–23)
CO2: 19 mmol/L — ABNORMAL LOW (ref 22–32)
Calcium: 9.8 mg/dL (ref 8.9–10.3)
Chloride: 108 mmol/L (ref 98–111)
Creatinine, Ser: 1.53 mg/dL — ABNORMAL HIGH (ref 0.44–1.00)
GFR calc Af Amer: 42 mL/min — ABNORMAL LOW (ref >60)
GFR calc non Af Amer: 36 mL/min — ABNORMAL LOW (ref >60)
Glucose, Bld: 94 mg/dL (ref 70–99)
Potassium: 4.2 mmol/L (ref 3.5–5.1)
Sodium: 139 mmol/L (ref 135–145)
Total Bilirubin: 0.6 mg/dL (ref 0.3–1.2)
Total Protein: 6.8 g/dL (ref 6.5–8.1)

## 2018-11-25 LAB — D-DIMER, QUANTITATIVE (NOT AT ARMC): D-Dimer, Quant: <0.27 ug/mL-FEU (ref 0.00–0.50)

## 2018-11-25 LAB — PROCALCITONIN: Procalcitonin: 0.31 ng/mL

## 2018-11-25 LAB — LIPASE, BLOOD: Lipase: 23 U/L (ref 11–51)

## 2018-11-25 LAB — FERRITIN: Ferritin: 48 ng/mL (ref 11–307)

## 2018-11-25 LAB — SARS CORONAVIRUS 2 BY RT PCR (HOSPITAL ORDER, PERFORMED IN ~~LOC~~ HOSPITAL LAB): SARS Coronavirus 2: NEGATIVE

## 2018-11-25 LAB — TROPONIN I: Troponin I: <0.03 ng/mL (ref <0.03)

## 2018-11-25 LAB — C-REACTIVE PROTEIN: CRP: <0.8 mg/dL (ref <1.0)

## 2018-11-25 LAB — LACTATE DEHYDROGENASE: LDH: 120 U/L (ref 98–192)

## 2018-11-25 MED ORDER — APIXABAN 5 MG PO TABS
5.0000 mg | ORAL_TABLET | Freq: Two times a day (BID) | ORAL | Status: DC
  Administered 2018-11-25 – 2018-11-28 (×6): 5 mg via ORAL
  Filled 2018-11-25 (×6): qty 1

## 2018-11-25 MED ORDER — SODIUM CHLORIDE 0.9 % IV BOLUS
1000.0000 mL | Freq: Once | INTRAVENOUS | Status: AC
  Administered 2018-11-25: 1000 mL via INTRAVENOUS

## 2018-11-25 MED ORDER — ATORVASTATIN CALCIUM 80 MG PO TABS
80.0000 mg | ORAL_TABLET | Freq: Every day | ORAL | Status: DC
  Administered 2018-11-26 – 2018-11-28 (×3): 80 mg via ORAL
  Filled 2018-11-25 (×3): qty 1

## 2018-11-25 MED ORDER — ASPIRIN 81 MG PO CHEW
81.0000 mg | CHEWABLE_TABLET | Freq: Every day | ORAL | Status: DC
  Administered 2018-11-26 – 2018-11-28 (×3): 81 mg via ORAL
  Filled 2018-11-25 (×3): qty 1

## 2018-11-25 MED ORDER — ENSURE MAX PROTEIN PO LIQD
237.0000 mL | Freq: Two times a day (BID) | ORAL | Status: DC
  Filled 2018-11-25 (×2): qty 330

## 2018-11-25 MED ORDER — ONDANSETRON HCL 4 MG PO TABS
4.0000 mg | ORAL_TABLET | Freq: Four times a day (QID) | ORAL | Status: DC | PRN
  Administered 2018-11-27 – 2018-11-28 (×3): 4 mg via ORAL
  Filled 2018-11-25 (×3): qty 1

## 2018-11-25 MED ORDER — SODIUM CHLORIDE 0.9 % IV BOLUS
500.0000 mL | Freq: Once | INTRAVENOUS | Status: AC
  Administered 2018-11-25: 500 mL via INTRAVENOUS

## 2018-11-25 MED ORDER — HYDROCODONE-ACETAMINOPHEN 5-325 MG PO TABS
1.0000 | ORAL_TABLET | Freq: Four times a day (QID) | ORAL | Status: DC | PRN
  Administered 2018-11-25 – 2018-11-28 (×6): 1 via ORAL
  Filled 2018-11-25 (×7): qty 1

## 2018-11-25 MED ORDER — SODIUM CHLORIDE 0.9 % IV SOLN
INTRAVENOUS | Status: DC
  Administered 2018-11-25 – 2018-11-26 (×4): via INTRAVENOUS

## 2018-11-25 MED ORDER — NICOTINE 21 MG/24HR TD PT24
21.0000 mg | MEDICATED_PATCH | Freq: Every day | TRANSDERMAL | Status: DC
  Administered 2018-11-25 – 2018-11-28 (×4): 21 mg via TRANSDERMAL
  Filled 2018-11-25 (×4): qty 1

## 2018-11-25 MED ORDER — ACETAMINOPHEN 325 MG PO TABS
650.0000 mg | ORAL_TABLET | Freq: Four times a day (QID) | ORAL | Status: DC | PRN
  Administered 2018-11-25: 650 mg via ORAL
  Filled 2018-11-25: qty 2

## 2018-11-25 MED ORDER — ONDANSETRON HCL 4 MG/2ML IJ SOLN
4.0000 mg | Freq: Four times a day (QID) | INTRAMUSCULAR | Status: DC | PRN
  Administered 2018-11-26: 4 mg via INTRAVENOUS
  Filled 2018-11-25 (×2): qty 2

## 2018-11-25 MED ORDER — SODIUM CHLORIDE 0.9% FLUSH
3.0000 mL | Freq: Two times a day (BID) | INTRAVENOUS | Status: DC
  Administered 2018-11-25 – 2018-11-28 (×5): 3 mL via INTRAVENOUS

## 2018-11-25 MED ORDER — COLCHICINE 0.6 MG PO TABS
0.6000 mg | ORAL_TABLET | Freq: Two times a day (BID) | ORAL | Status: DC
  Administered 2018-11-25 – 2018-11-26 (×3): 0.6 mg via ORAL
  Filled 2018-11-25 (×3): qty 1

## 2018-11-25 MED ORDER — PANTOPRAZOLE SODIUM 40 MG PO TBEC
40.0000 mg | DELAYED_RELEASE_TABLET | Freq: Every day | ORAL | Status: DC
  Administered 2018-11-26 – 2018-11-28 (×3): 40 mg via ORAL
  Filled 2018-11-25 (×3): qty 1

## 2018-11-25 NOTE — ED Notes (Signed)
MD notified of lactic acid 

## 2018-11-25 NOTE — ED Notes (Signed)
Attempted report 

## 2018-11-25 NOTE — H&P (Signed)
History and Physical    Kimberly [name] QQV:[id number] DOB: 1955/08/16 DOA: 11/25/2018  PCP: [name], FNP Consultants:  [facility] - cardiology Patient coming from:  Home - lives alone and has a caregiver   Chief Complaint: Diarrhea  HPI: Kimberly [name] is a 63 y.o. female with medical history significant of CVA; HTN; HLD; afib on Eliquis; and CAD s/p CABG x 4 presenting with diarrhea.  She reports several days of non-bloody stools happening 3-4 times daily; cough; subjective fever; and generalized body aches.  She denies sick contacts but does have a caregiver.   ED Course:   Patient may need observation for diarrhea.  Has h/o afib, on AC.  Also with h/o CAD.  Diarrhea for several days and has been weak, light-headed, dizzy.  BPs in 70s, with afib with 120s by EMS.  Converted to NSR en route.  BP now up to 88/60.  Given bolus 500 cc x 2 and 1 L now.  Lactate minimally elevated.  Creatinine mildly increased.  Likely viral gastroenteritis with dehydration.  Review of Systems: As per HPI; otherwise review of systems reviewed and negative other than chronic "gout" of her entire left foot - "I can tell when it's gonna rain"   Past Medical History:  Diagnosis Date  . Bicipital tenosynovitis   . Coronary artery disease 05/2007   cabgx4   . Fall 02/13/2018  . Family history of ischemic heart disease   . Gout   . Hammer toe   . Hip, thigh, leg, and ankle, insect bite, nonvenomous, without mention of infection(916.4)   . Hyperlipidemia   . Hypertension   . Other abnormality of red blood cells   . Rotator cuff syndrome of left shoulder   . Stroke [facility])     Past Surgical History:  Procedure Laterality Date  . CARDIAC CATHETERIZATION    . CORONARY ARTERY BYPASS GRAFT     triple  . CORONARY ARTERY BYPASS GRAFT  2008  . TUBAL LIGATION      Social History   Socioeconomic History  . Marital status: Single    Spouse name: Not on file  . Number of children: Not on file  . Years  of education: Not on file  . Highest education level: Not on file  Occupational History  . Not on file  Social Needs  . Financial resource strain: Not on file  . Food insecurity:    Worry: Not on file    Inability: Not on file  . Transportation needs:    Medical: Not on file    Non-medical: Not on file  Tobacco Use  . Smoking status: Light Tobacco Smoker    Types: Cigarettes  . Smokeless tobacco: Former Systems developer    Quit date: 08/09/2008  Substance and Sexual Activity  . Alcohol use: Yes    Alcohol/week: 1.0 standard drinks    Types: 1 Cans of beer per week  . Drug use: Yes    Types: Marijuana    Comment: history of cocaine use  . Sexual activity: Not on file  Lifestyle  . Physical activity:    Days per week: Not on file    Minutes per session: Not on file  . Stress: Not on file  Relationships  . Social connections:    Talks on phone: Not on file    Gets together: Not on file    Attends religious service: Not on file    Active member of club or organization: Not on file  Attends meetings of clubs or organizations: Not on file    Relationship status: Not on file  . Intimate partner violence:    Fear of current or ex partner: Not on file    Emotionally abused: Not on file    Physically abused: Not on file    Forced sexual activity: Not on file  Other Topics Concern  . Not on file  Social History Narrative  . Not on file    Allergies  Allergen Reactions  . Clonidine Derivatives Other (See Comments)    Patch causes scars  . Spironolactone Rash    Family History  Problem Relation Age of Onset  . Hypertension Mother   . Heart attack Father   . Diabetes Father   . Anxiety disorder Sister   . Sarcoidosis Sister     Prior to Admission medications   Medication Sig Start Date End Date Taking? Authorizing Provider  acetaminophen (TYLENOL) 500 MG tablet Take 1,000 mg by mouth every 6 (six) hours as needed for moderate pain.    [provider]  amLODipine  (NORVASC) 10 MG tablet TAKE 1 TABLET BY MOUTH EVERY DAY 08/02/18   [name], FNP  apixaban (ELIQUIS) 5 MG TABS tablet Take 1 tablet (5 mg total) by mouth 2 (two) times daily. 10/10/18   [name], MD  aspirin 81 MG tablet Take 81 mg by mouth daily.      [provider]  atorvastatin (LIPITOR) 80 MG tablet TAKE 1 TABLET BY MOUTH DAILY 10/07/18   [name], FNP  Blood Pressure Monitoring (B-D ASSURE BPM/AUTO ARM CUFF) MISC 1 Device by Does not apply route once. 04/27/14   [name], MD  chlorthalidone (HYGROTON) 25 MG tablet TAKE 1 TABLET BY MOUTH DAILY 08/02/18   [name], FNP  diclofenac sodium (VOLTAREN) 1 % GEL Apply 2 g topically 4 (four) times daily. 07/24/18   [name], FNP  feeding supplement, ENSURE COMPLETE, (ENSURE COMPLETE) LIQD Take 237 mLs by mouth 2 (two) times daily between meals. 01/30/17   [name], MD  HYDROcodone-acetaminophen (NORCO/VICODIN) 5-325 MG tablet Take 1 tablet by mouth every 6 (six) hours as needed for moderate pain.    [provider]  lidocaine (LIDODERM) 5 % Place 1 patch onto the skin daily. Remove & Discard patch within 12 hours or as directed by MD    [provider]  lisinopril (PRINIVIL,ZESTRIL) 40 MG tablet TAKE 1 TABLET BY MOUTH DAILY 08/02/18   [name], FNP  MITIGARE 0.6 MG CAPS TAKE 1 CAPSULE BY MOUTH TWICE DAILY 08/02/18   [name], FNP  Multiple Vitamins-Minerals (MULTIVITAMIN WITH MINERALS) tablet Take 1 tablet by mouth daily.      [provider]  nicotine (NICODERM CQ - DOSED IN MG/24 HOURS) 21 mg/24hr patch Place 21 mg onto the skin daily.    [provider]  omeprazole (PRILOSEC) 20 MG capsule TAKE 1 CAPSULE BY MOUTH EVERY DAY 08/02/18   [name], FNP    Physical Exam: Vitals:   11/25/18 1400 11/25/18 1430 11/25/18 1500 11/25/18 1517  BP: (!) 76/64 93/71 (!) 81/64 (!) 88/60  Pulse: 84 76 74   Resp: 19 17 17    Temp:      TempSrc:      SpO2: 100% 100%  99%   Weight:         . General: Cantankerous, c/o being cold without apparent fever, NAD . Eyes:  PERRL, EOMI, normal lids, iris . ENT:  grossly normal hearing,  lips & tongue, mmm . Neck:  no LAD, masses or thyromegaly . Cardiovascular:  RRR, no m/r/g. No LE edema.  [name] Respiratory:   CTA bilaterally with no wheezes/rales/rhonchi.  Normal respiratory effort. . Abdomen:  Soft, moderately diffusely TTP - ?inconsistent exam, ND, NABS . Skin:  no rash or induration seen on limited exam . Musculoskeletal:  grossly normal tone BUE/BLE, good ROM, no bony abnormality . Lower extremity:  No LE edema.  Limited left foot exam with no ulcerations, erythema, edema concerning for gout.  2+ distal pulses. [name] Psychiatric:  cantankerous and affect, speech fluent and appropriate, AOx3 . Neurologic:  CN 2-12 grossly intact, moves all extremities in coordinated fashion with mild left hemiparesis, sensation intact    Radiological Exams on Admission: Dg Chest Port 1 View  Result Date: 11/25/2018 CLINICAL DATA:  Cough and diarrhea over the last week. EXAM: PORTABLE CHEST 1 VIEW COMPARISON:  03/24/2018 FINDINGS: Previous median sternotomy and CABG. Pulmonary vascularity is normal. There is atherosclerosis of the aorta. The lungs are clear. No effusions. No acute bone finding. IMPRESSION: No active disease.  Previous CABG.  Aortic atherosclerosis. Electronically Signed   By: Nelson Chimes M.D.   On: 11/25/2018 13:34    EKG: Independently reviewed.  NSR with rate 84; LVH; nonspecific ST changes with no evidence of acute ischemia   Labs on Admission: I have personally reviewed the available labs and imaging studies at the time of the admission.  Pertinent labs:   CO2 19, 23 on 12/11 BUN 28/Creatinine 1.53/GFR 42; 18/1.02/69 on 12/11 Troponin <0.03 Lactate 2.3 WBC 7.5 Hgb 11.7 A1c 5.9 on 12/11  Assessment/Plan Principal Problem:   Diarrhea Active Problems:   Dyslipidemia   Essential hypertension    Atrial fibrillation with RVR (HCC)   Coronary artery disease involving native coronary artery of native heart without angina pectoris   Hemiparesis affecting nondominant 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 ([place])   Dehydration, mild   Diarrhea with mild dehydration -Patient with 3-4 loose stools per day -Appears to have mild dehydration with AKI associated -While likely viral, will order C diff -She also reports cough, subjective fever - low risk for COVID but will r/o -Persistent hypotension and mildly elevated lacate without SIRS criteria, unlikely sepsis and more likely related to dehydration but will dehydrate and trend lactate -No O2 requirement -Will observe overnight with rehydration -Monitor on telemetry x at least 24 hours -At this time, will attempt to avoid use of aerosolized medications and use HFAs instead -Will check additional labs including  CRP; ferritin; D-dimer; procalcitonin; LDH -Patient was seen wearing full PPE including: gown, gloves, head cover, N95, and face shield; donning and doffing was observed to be in compliance with current standards.  Afib with RVR -Patient with afib with EMS but this converted to NSR prior to arrival -She does not appear to be on a rate-controlling medication at home; this could be considered prior to d/c or as an outpatient -Continue Eliquis  CAD s/p CABG -Continue ASA  S/p CVA -Residual weakness -Has an in-home caregiver  HTN -Hold BP medications in the setting of hypotension, dehydration, AKI -Consider restarting Norvasc tomorrow based on BP -Consider holding Chlorthalidone and Lisinopril longer to enable renal recovery  HLD -Continue Lipitor    DVT prophylaxis:  Eliquis Code Status:  Full  Family Communication: None present Disposition Plan:  Home once clinically improved Consults called: None  Admission status: It is my clinical opinion that referral for OBSERVATION is reasonable and  necessary in this  patient based on the above information provided. The aforementioned taken together are felt to place the patient at high risk for further clinical deterioration. However it is anticipated that the patient may be medically stable for discharge from the hospital within 24 to 48 hours.    Karmen Bongo MD [facility]   How to contact the [facility] Attending or Consulting provider [place] or covering provider during after hours [place], for this patient?  1. Check the care team in [facility] and look for a) attending/consulting TR[facility] provider listed and b) the [facility] team listed 2. Log into [email or web address] and use [place]'s universal password to access. If you do not have the password, please contact the hospital operator. 3. Locate the [facility] provider you are looking for under [facility] and page to a number that you can be directly reached. 4. If you still have difficulty reaching the provider, please page the [facility] (Director on Call) for the Hospitalists listed on amion for assistance.   11/25/2018, 4:39 PM

## 2018-11-25 NOTE — ED Triage Notes (Signed)
Pt arrives by EMS from home where her aide called EMS due to diarrhea since yesterday.  Aide told ems that pt has had intermittent diaphoresis.  Pt reported cough for 3 days to ems, aide denied any URI in pt.  Pt is alert and oriented.  Pt reports that she has had diarrhea, dizziness, headache and cough.  Pt also reports frequent urination, no pain or burning with urination.  EMS found pt to be in afib with RVR 120-170's. No IV pta.

## 2018-11-25 NOTE — ED Notes (Signed)
Called bed control.  Pt neg for Covid 19.

## 2018-11-25 NOTE — ED Notes (Signed)
ED TO INPATIENT HANDOFF REPORT  ED Nurse Name and Phone #: Hassan Rowan [phone]  S Name/Age/Gender Kimberly [name] 63 y.o. female Room/Bed: RESUSC/RESUSC  Code Status   Code Status: Full Code  Home/SNF/Other Home Patient oriented to: self, place, time and situation Is this baseline? Yes   Triage Complete: Triage complete  Chief Complaint Afib/Hypotensive  Triage Note Pt arrives by EMS from home where her aide called EMS due to diarrhea since yesterday.  Aide told ems that pt has had intermittent diaphoresis.  Pt reported cough for 3 days to ems, aide denied any URI in pt.  Pt is alert and oriented.  Pt reports that she has had diarrhea, dizziness, headache and cough.  Pt also reports frequent urination, no pain or burning with urination.  EMS found pt to be in afib with RVR 120-170's. No IV pta.     Allergies Allergies  Allergen Reactions  . Clonidine Derivatives Other (See Comments)    Patch causes scars  . Spironolactone Rash    Level of Care/Admitting Diagnosis ED Disposition    ED Disposition Condition [facility] Area: [place] [100100]  Level of Care: Telemetry Medical [104]  I expect the patient will be discharged within 24 hours: Yes  LOW acuity---Tx typically complete <24 hrs---ACUTE conditions typically can be evaluated <24 hours---LABS likely to return to acceptable levels <24 hours---IS near functional baseline---EXPECTED to return to current living arrangement---NOT newly hypoxic: Meets criteria for 5C-Observation unit  Diagnosis: Diarrhea [787.91.ICD-9-CM]  Admitting Physician: Karmen Bongo [[id number]]  Attending Physician: Karmen Bongo [[id number]]  Bed request comments: low-risk COVID  PT Class (Do Not Modify): Observation [104]  PT Acc Code (Do Not Modify): Observation [10022]       B Medical/Surgery History Past Medical History:  Diagnosis Date  . Bicipital tenosynovitis   . Coronary artery disease 05/2007   cabgx4   .  Fall 02/13/2018  . Family history of ischemic heart disease   . Gout   . Hammer toe   . Hip, thigh, leg, and ankle, insect bite, nonvenomous, without mention of infection(916.4)   . Hyperlipidemia   . Hypertension   . Other abnormality of red blood cells   . Rotator cuff syndrome of left shoulder   . Stroke [facility])    Past Surgical History:  Procedure Laterality Date  . CARDIAC CATHETERIZATION    . CORONARY ARTERY BYPASS GRAFT     triple  . CORONARY ARTERY BYPASS GRAFT  2008  . TUBAL LIGATION       A IV Location/Drains/Wounds Patient Lines/Drains/Airways Status   Active Line/Drains/Airways    Name:   Placement date:   Placement time:   Site:   Days:   Peripheral IV 11/25/18 Left;Posterior Forearm   11/25/18    1243    Forearm   less than 1   Pressure Injury Stage II -  Partial thickness loss of dermis presenting as a shallow open ulcer with a red, pink wound bed without slough.   -    -               Intake/Output Last 24 hours  Intake/Output Summary (Last 24 hours) at 11/25/2018 1702 Last data filed at 11/25/2018 1629 Gross per 24 hour  Intake 1650 ml  Output -  Net 1650 ml    Labs/Imaging Results for orders placed or performed during the hospital encounter of 11/25/18 (from the past 48 hour(s))  CBC with Differential/Platelet  Status: Abnormal   Collection Time: 11/25/18  1:00 PM  Result Value Ref Range   WBC 7.5 4.0 - 10.5 K/uL   RBC 5.42 (H) 3.87 - 5.11 MIL/uL   Hemoglobin 11.7 (L) 12.0 - 15.0 g/dL   HCT 40.2 36.0 - 46.0 %   MCV 74.2 (L) 80.0 - 100.0 fL   MCH 21.6 (L) 26.0 - 34.0 pg   MCHC 29.1 (L) 30.0 - 36.0 g/dL   RDW 16.8 (H) 11.5 - 15.5 %   Platelets 209 150 - 400 K/uL   nRBC 0.0 0.0 - 0.2 %   Neutrophils Relative % 64 %   Neutro Abs 4.8 1.7 - 7.7 K/uL   Lymphocytes Relative 29 %   Lymphs Abs 2.2 0.7 - 4.0 K/uL   Monocytes Relative 6 %   Monocytes Absolute 0.5 0.1 - 1.0 K/uL   Eosinophils Relative 1 %   Eosinophils Absolute 0.1 0.0 - 0.5 K/uL    Basophils Relative 0 %   Basophils Absolute 0.0 0.0 - 0.1 K/uL   Immature Granulocytes 0 %   Abs Immature Granulocytes 0.02 0.00 - 0.07 K/uL    Comment: Performed at [facility], [address], [place], [place] [address]  Comprehensive metabolic panel     Status: Abnormal   Collection Time: 11/25/18  1:00 PM  Result Value Ref Range   Sodium 139 135 - 145 mmol/L   Potassium 4.2 3.5 - 5.1 mmol/L   Chloride 108 98 - 111 mmol/L   CO2 19 (L) 22 - 32 mmol/L   Glucose, Bld 94 70 - 99 mg/dL   BUN 28 (H) 8 - 23 mg/dL   Creatinine, Ser 1.53 (H) 0.44 - 1.00 mg/dL   Calcium 9.8 8.9 - 10.3 mg/dL   Total Protein 6.8 6.5 - 8.1 g/dL   Albumin 4.1 3.5 - 5.0 g/dL   AST 26 15 - 41 U/L   ALT 26 0 - 44 U/L   Alkaline Phosphatase 99 38 - 126 U/L   Total Bilirubin 0.6 0.3 - 1.2 mg/dL   GFR calc non Af Amer 36 (L) >60 mL/min   GFR calc Af Amer 42 (L) >60 mL/min   Anion gap 12 5 - 15    Comment: Performed at [facility] [address], [place], [place] [address]  Lipase, blood     Status: None   Collection Time: 11/25/18  1:00 PM  Result Value Ref Range   Lipase 23 11 - 51 U/L    Comment: Performed at [facility] [address] [place], [place] [address]  Lactic acid, plasma     Status: Abnormal   Collection Time: 11/25/18  1:00 PM  Result Value Ref Range   Lactic Acid, Venous 2.3 (HH) 0.5 - 1.9 mmol/L    Comment: CRITICAL RESULT CALLED TO, READ BACK BY AND VERIFIED WITHHeath Lark RN @ 8676 11/25/18 BY S GEZAHEGN Performed at [facility], [place] [address] [place], [place] [address] CORRECTED ON 04/13 AT 3267: PREVIOUSLY REPORTED AS 2.3 CRITICAL RESULT CALLED TO, READ BACK BY AND VERIFIED WITH: B MICKLOSON @ 1355 11/25/18 BY S GEZAHEGN   Troponin I - ONCE - STAT     Status: None   Collection Time: 11/25/18  1:00 PM  Result Value Ref Range   Troponin I <0.03 <0.03 ng/mL    Comment: Performed at [facility], [place] [address] [place], [place] [address]   Dg  Chest  Port 1 View  Result Date: 11/25/2018 CLINICAL DATA:  Cough and diarrhea over the last week. EXAM: PORTABLE CHEST 1 VIEW COMPARISON:  03/24/2018 FINDINGS: Previous median sternotomy and CABG. Pulmonary vascularity is normal. There is atherosclerosis of the aorta. The lungs are clear. No effusions. No acute bone finding. IMPRESSION: No active disease.  Previous CABG.  Aortic atherosclerosis. Electronically Signed   By: Nelson Chimes M.D.   On: 11/25/2018 13:34    Pending Labs Unresulted Labs (From admission, onward)    Start     Ordered   11/26/18 0500  CBC with Differential/Platelet  Daily,   R     11/25/18 1633   11/26/18 0500  Comprehensive metabolic panel  Daily,   R     11/25/18 1633   11/25/18 1634  Lactic acid, plasma  STAT Now then every 3 hours,   R     11/25/18 1633   11/25/18 1633  C difficile quick scan w PCR reflex  (C Difficile quick screen w PCR reflex panel)  Once, for 24 hours,   R     11/25/18 1633   11/25/18 1632  Procalcitonin  Once,   R     11/25/18 1633   11/25/18 1632  Ferritin  Once,   R     11/25/18 1633   11/25/18 1632  D-dimer, quantitative (not at [facility])  Once,   R     11/25/18 1633   11/25/18 1632  C-reactive protein  Once,   R     11/25/18 1633   11/25/18 1632  Lactate dehydrogenase  Once,   R     11/25/18 1633   11/25/18 1629  SARS Coronavirus [facility] order, Performed in [facility] hospital lab)  (Novel Coronavirus, [facility] Order))  Once,   R     11/25/18 1633   11/25/18 1629  Novel Coronavirus, NAA (hospital order; send-out to ref lab)  (Novel Coronavirus, NAA [facility] Order))  Once,   R    Question Answer Comment  Current symptoms Fever and Cough   Excluded other viral illnesses No (testing not indicated)      11/25/18 1633          Vitals/Pain Today's Vitals   11/25/18 1430 11/25/18 1500 11/25/18 1517 11/25/18 1643  BP: 93/71 (!) 81/64 (!) 88/60 90/69  Pulse: 76 74  71  Resp: 17 17  (!) 22  Temp:      TempSrc:      SpO2:  100% 99%  99%  Weight:      PainSc:        Isolation Precautions Enteric precautions (UV disinfection)  Medications Medications  aspirin chewable tablet 81 mg (has no administration in time range)  HYDROcodone-acetaminophen (NORCO/VICODIN) 5-325 MG per tablet 1 tablet (has no administration in time range)  Colchicine CAPS 1 capsule (has no administration in time range)  atorvastatin (LIPITOR) tablet 80 mg (has no administration in time range)  nicotine (NICODERM CQ - dosed in mg/24 hours) patch 21 mg (has no administration in time range)  pantoprazole (PROTONIX) EC tablet 40 mg (has no administration in time range)  apixaban (ELIQUIS) tablet 5 mg (has no administration in time range)  feeding supplement (ENSURE COMPLETE) (ENSURE COMPLETE) liquid 237 mL (has no administration in time range)  acetaminophen (TYLENOL) tablet 650 mg (has no administration in time range)  ondansetron (ZOFRAN) tablet 4 mg (has no administration in time range)    Or  ondansetron (ZOFRAN) injection 4 mg (has no  administration in time range)  sodium chloride flush (NS) 0.9 % injection 3 mL (has no administration in time range)  0.9 %  sodium chloride infusion (has no administration in time range)  sodium chloride 0.9 % bolus 500 mL (0 mLs Intravenous Stopped 11/25/18 1629)  sodium chloride 0.9 % bolus 500 mL (0 mLs Intravenous Stopped 11/25/18 1510)  sodium chloride 0.9 % bolus 1,000 mL (1,000 mLs Intravenous New Bag/Given 11/25/18 1639)    Mobility walks with device Moderate fall risk   Focused Assessments Cardiac Assessment Handoff:  Cardiac Rhythm: Normal sinus rhythm Lab Results  Component Value Date   CKTOTAL 91 12/05/2007   CKMB 1.5 12/05/2007   TROPONINI <0.03 11/25/2018   Lab Results  Component Value Date   DDIMER 0.84 (H) 02/13/2018   Does the Patient currently have chest pain? No     R Recommendations: See Admitting Provider Note  Report given to:   Additional Notes:  Pt only  allows phlebotomy to draw blood

## 2018-11-25 NOTE — ED Provider Notes (Signed)
[facility] EMERGENCY DEPARTMENT Provider Note   CSN: [id number] Arrival date & time: 11/25/18  1229    History   Chief Complaint Chief Complaint  Patient presents with  . Diarrhea    HPI Kimberly [name] is a 63 y.o. female.     HPI Patient with history of stroke and residual left-sided weakness presents with 3 days of diarrhea.  Has had multiple episodes.  Denies nausea or vomiting.  States her stomach has been "upset".  Also complains of lightheadedness and dizziness.  EMS called by patient's caretaker.  Noted patient to be in atrial fibrillation with heart rate in the 120s to 170s.  Also patient was hypotensive.  EMS state patient converted to a normal sinus rhythm in route.  Patient blood pressures are improved.  No fever or chills. Past Medical History:  Diagnosis Date  . Bicipital tenosynovitis   . Coronary artery disease 05/2007   [id number]   . Fall 02/13/2018  . Family history of ischemic heart disease   . Gout   . Hammer toe   . Hip, thigh, leg, and ankle, insect bite, nonvenomous, without mention of infection(916.4)   . Hyperlipidemia   . Hypertension   . Other abnormality of red blood cells   . Rotator cuff syndrome of left shoulder   . Stroke [facility])     Patient Active Problem List   Diagnosis Date Noted  . Hemiparesis affecting nondominant 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 ([place]) 07/31/2018  . Muscle ache 07/31/2018  . Prediabetes 07/31/2018  . Chronic gout without tophus 07/31/2018  . Vision loss 07/31/2018  . Malnutrition of moderate degree 02/15/2018  . Carotid occlusion, bilateral   . Atrial fibrillation with RVR ([place]) 02/13/2018  . Positive D dimer 02/13/2018  . Back pain 02/13/2018  . CVA (cerebral vascular accident) ([place]) 02/13/2018  . Paroxysmal atrial fibrillation ([place]) 02/13/2018  . Coronary artery disease involving native coronary artery of native heart without angina pectoris 02/13/2018  . Apical variant  hypertrophic cardiomyopathy ([place]) 02/13/2018  . 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 Aortic atherosclerosis ([place]) 02/13/2018  . CAD, ARTERY BYPASS GRAFT 05/03/2009  . TOBACCO USE, QUIT 05/03/2009  . ROTATOR CUFF SYNDROME, LEFT 02/12/2009  . Shoulder pain, left 11/03/2008  . HAMMER TOE, ACQUIRED 02/11/2008  . INSECT BITE, LEG 02/11/2008  . Cerebral artery occlusion with cerebral infarction ([place]) 12/05/2007  . Dyslipidemia 11/05/2007  . UNSPECIFIED DISORDER TEETH&SUPPORTING STRUCTURES 11/05/2007  . BICEPS TENDINITIS, RIGHT 04/24/2007  . Essential hypertension 04/23/2007  . MICROCYTOSIS 04/23/2007    Past Surgical History:  Procedure Laterality Date  . CARDIAC CATHETERIZATION    . CORONARY ARTERY BYPASS GRAFT     triple  . CORONARY ARTERY BYPASS GRAFT  2008  . TUBAL LIGATION       OB History   No obstetric history on file.      Home Medications    Prior to Admission medications   Medication Sig Start Date End Date Taking? Authorizing Provider  acetaminophen (TYLENOL) 500 MG tablet Take 1,000 mg by mouth every 6 (six) hours as needed for moderate pain.    [provider]  amLODipine (NORVASC) 10 MG tablet TAKE 1 TABLET BY MOUTH EVERY DAY 08/02/18   [name], FNP  apixaban (ELIQUIS) 5 MG TABS tablet Take 1 tablet (5 mg total) by mouth 2 (two) times daily. 10/10/18   [name], MD  aspirin 81 MG tablet Take 81 mg by mouth daily.  [provider]  atorvastatin (LIPITOR) 80 MG tablet TAKE 1 TABLET BY MOUTH DAILY 10/07/18   [name], FNP  Blood Pressure Monitoring (B-D ASSURE BPM/AUTO ARM CUFF) MISC 1 Device by Does not apply route once. 04/27/14   [name], MD  chlorthalidone (HYGROTON) 25 MG tablet TAKE 1 TABLET BY MOUTH DAILY 08/02/18   [name], FNP  diclofenac sodium (VOLTAREN) 1 % GEL Apply 2 g topically 4 (four) times daily. 07/24/18   [name], FNP  feeding supplement, ENSURE COMPLETE, (ENSURE COMPLETE) LIQD Take  237 mLs by mouth 2 (two) times daily between meals. 01/30/17   [name], MD  HYDROcodone-acetaminophen (NORCO/VICODIN) 5-325 MG tablet Take 1 tablet by mouth every 6 (six) hours as needed for moderate pain.    [provider]  lidocaine (LIDODERM) 5 % Place 1 patch onto the skin daily. Remove & Discard patch within 12 hours or as directed by MD    [provider]  lisinopril (PRINIVIL,ZESTRIL) 40 MG tablet TAKE 1 TABLET BY MOUTH DAILY 08/02/18   [name], FNP  MITIGARE 0.6 MG CAPS TAKE 1 CAPSULE BY MOUTH TWICE DAILY 08/02/18   [name], FNP  Multiple Vitamins-Minerals (MULTIVITAMIN WITH MINERALS) tablet Take 1 tablet by mouth daily.      [provider]  nicotine (NICODERM CQ - DOSED IN MG/24 HOURS) 21 mg/24hr patch Place 21 mg onto the skin daily.    [provider]  omeprazole (PRILOSEC) 20 MG capsule TAKE 1 CAPSULE BY MOUTH EVERY DAY 08/02/18   [name], FNP    Family History Family History  Problem Relation Age of Onset  . Hypertension Mother   . Heart attack Father   . Diabetes Father   . Anxiety disorder Sister   . Sarcoidosis Sister     Social History Social History   Tobacco Use  . Smoking status: Light Tobacco Smoker    Types: Cigarettes  . Smokeless tobacco: Former Systems developer    Quit date: 08/09/2008  Substance Use Topics  . Alcohol use: Yes    Alcohol/week: 1.0 standard drinks    Types: 1 Cans of beer per week  . Drug use: Yes    Types: Marijuana    Comment: history of cocaine use     Allergies   Clonidine derivatives and Spironolactone   Review of Systems Review of Systems  Constitutional: Negative for chills and fever.  HENT: Negative for sore throat and trouble swallowing.   Eyes: Negative for visual disturbance.  Respiratory: Positive for cough. Negative for shortness of breath.   Cardiovascular: Negative for chest pain.  Gastrointestinal: Positive for abdominal pain and diarrhea. Negative for blood in  stool, constipation, nausea and vomiting.  Genitourinary: Negative for dysuria and flank pain.  Musculoskeletal: Negative for back pain, neck pain and neck stiffness.  Skin: Negative for rash and wound.  Neurological: Positive for dizziness, light-headedness and headaches. Negative for syncope, weakness and numbness.  All other systems reviewed and are negative.    Physical Exam Updated Vital Signs BP (!) 88/60 (BP Location: Right Arm) Comment: MANUAL  Pulse 74   Temp 97.8 F (36.6 C) (Oral)   Resp 17   Wt 57.6 kg   SpO2 99%   BMI 19.89 kg/m   Physical Exam Vitals signs and nursing note reviewed.  Constitutional:      General: She is not in acute distress.    Appearance: Normal appearance. She is well-developed. She is not ill-appearing.  HENT:  Head: Normocephalic and atraumatic.     Nose: Nose normal.     Mouth/Throat:     Mouth: Mucous membranes are moist.  Eyes:     Extraocular Movements: Extraocular movements intact.     Pupils: Pupils are equal, round, and reactive to light.  Neck:     Musculoskeletal: Normal range of motion and neck supple. No neck rigidity or muscular tenderness.     Vascular: No carotid bruit.  Cardiovascular:     Rate and Rhythm: Normal rate and regular rhythm.     Heart sounds: No murmur. No friction rub. No gallop.   Pulmonary:     Effort: Pulmonary effort is normal. No respiratory distress.     Breath sounds: Normal breath sounds. No stridor. No wheezing, rhonchi or rales.  Chest:     Chest wall: No tenderness.  Abdominal:     General: Bowel sounds are normal.     Palpations: Abdomen is soft.     Tenderness: There is no abdominal tenderness. There is no guarding or rebound.     Comments: Mild generalized abdominal tenderness to palpation without focality, rebound or guarding.  Musculoskeletal: Normal range of motion.        General: No swelling, tenderness, deformity or signs of injury.     Right lower leg: No edema.     Left lower  leg: No edema.  Lymphadenopathy:     Cervical: No cervical adenopathy.  Skin:    General: Skin is warm and dry.     Findings: No erythema or rash.  Neurological:     Mental Status: She is alert and oriented to person, place, and time.     Comments: Residual left-sided upper and lower extremity weakness.  5/5 motor in right upper and right lower extremities with sensation intact.  Psychiatric:        Behavior: Behavior normal.     Comments: Anxious appearing      ED Treatments / Results  Labs (all labs ordered are listed, but only abnormal results are displayed) Labs Reviewed  CBC WITH DIFFERENTIAL/PLATELET - Abnormal; Notable for the following components:      Result Value   RBC 5.42 (*)    Hemoglobin 11.7 (*)    MCV 74.2 (*)    MCH 21.6 (*)    MCHC 29.1 (*)    RDW 16.8 (*)    All other components within normal limits  COMPREHENSIVE METABOLIC PANEL - Abnormal; Notable for the following components:   CO2 19 (*)    BUN 28 (*)    Creatinine, Ser 1.53 (*)    GFR calc non Af Amer 36 (*)    GFR calc Af Amer 42 (*)    All other components within normal limits  LACTIC ACID, PLASMA - Abnormal; Notable for the following components:   Lactic Acid, Venous 2.3 (*)    All other components within normal limits  LIPASE, BLOOD  TROPONIN I  URINALYSIS, ROUTINE W REFLEX MICROSCOPIC    EKG EKG Interpretation  Date/Time:  Monday November 25 2018 12:43:16 EDT Ventricular Rate:  84 PR Interval:    QRS Duration: 95 QT Interval:  401 QTC Calculation: 474 R Axis:   61 Text Interpretation:  Sinus rhythm Atrial premature complex LVH with secondary repolarization abnormality Confirmed by Julianne Rice [phone]) on 11/25/2018 12:59:12 PM   Radiology Dg Chest Port 1 View  Result Date: 11/25/2018 CLINICAL DATA:  Cough and diarrhea over the last week. EXAM: PORTABLE CHEST 1 VIEW COMPARISON:  03/24/2018 FINDINGS: Previous median sternotomy and CABG. Pulmonary vascularity is normal. There is  atherosclerosis of the aorta. The lungs are clear. No effusions. No acute bone finding. IMPRESSION: No active disease.  Previous CABG.  Aortic atherosclerosis. Electronically Signed   By: Nelson Chimes M.D.   On: 11/25/2018 13:34    Procedures Procedures (including critical care time)  Medications Ordered in ED Medications  sodium chloride 0.9 % bolus 1,000 mL (has no administration in time range)  sodium chloride 0.9 % bolus 500 mL (500 mLs Intravenous New Bag/Given 11/25/18 1411)  sodium chloride 0.9 % bolus 500 mL (0 mLs Intravenous Stopped 11/25/18 1510)    CRITICAL CARE Performed by: Julianne Rice Total critical care time: 25 minutes Critical care time was exclusive of separately billable procedures and treating other patients. Critical care was necessary to treat or prevent imminent or life-threatening deterioration. Critical care was time spent personally by me on the following activities: development of treatment plan with patient and/or surrogate as well as nursing, discussions with consultants, evaluation of patient's response to treatment, examination of patient, obtaining history from patient or surrogate, ordering and performing treatments and interventions, ordering and review of laboratory studies, ordering and review of radiographic studies, pulse oximetry and re-evaluation of patient's condition. Initial Impression / Assessment and Plan / ED Course  I have reviewed the triage vital signs and the nursing notes.  Pertinent labs & imaging results that were available during my care of the patient were reviewed by me and considered in my medical decision making (see chart for details).        Mild elevation of creatinine.  Suspect due to dehydration.  Patient with persistent mild hypertension despite IV fluids.  Continues to mentate well.  Abdomen is soft.  Do not believe imaging is required at this point.  Discussed with hospitalist will see patient emergency department and  admit.  Final Clinical Impressions(s) / ED Diagnoses   Final diagnoses:  Diarrhea, unspecified type  Dehydration  Hypotension due to hypovolemia    ED Discharge Orders    None       Julianne Rice, MD 11/25/18 1542

## 2018-11-26 ENCOUNTER — Encounter (HOSPITAL_COMMUNITY): Payer: Self-pay | Admitting: Internal Medicine

## 2018-11-26 ENCOUNTER — Inpatient Hospital Stay (HOSPITAL_COMMUNITY): Payer: Medicaid Other

## 2018-11-26 DIAGNOSIS — I4891 Unspecified atrial fibrillation: Secondary | ICD-10-CM | POA: Diagnosis present

## 2018-11-26 DIAGNOSIS — E785 Hyperlipidemia, unspecified: Secondary | ICD-10-CM | POA: Diagnosis present

## 2018-11-26 DIAGNOSIS — N179 Acute kidney failure, unspecified: Secondary | ICD-10-CM | POA: Diagnosis present

## 2018-11-26 DIAGNOSIS — I2510 Atherosclerotic heart disease of native coronary artery without angina pectoris: Secondary | ICD-10-CM | POA: Diagnosis present

## 2018-11-26 DIAGNOSIS — Z79899 Other long term (current) drug therapy: Secondary | ICD-10-CM | POA: Diagnosis not present

## 2018-11-26 DIAGNOSIS — Z951 Presence of aortocoronary bypass graft: Secondary | ICD-10-CM | POA: Diagnosis not present

## 2018-11-26 DIAGNOSIS — M109 Gout, unspecified: Secondary | ICD-10-CM | POA: Diagnosis present

## 2018-11-26 DIAGNOSIS — M2040 Other hammer toe(s) (acquired), unspecified foot: Secondary | ICD-10-CM | POA: Diagnosis present

## 2018-11-26 DIAGNOSIS — I69354 Hemiplegia and hemiparesis following cerebral infarction affecting left non-dominant side: Secondary | ICD-10-CM | POA: Diagnosis not present

## 2018-11-26 DIAGNOSIS — Z888 Allergy status to other drugs, medicaments and biological substances status: Secondary | ICD-10-CM | POA: Diagnosis not present

## 2018-11-26 DIAGNOSIS — I9589 Other hypotension: Secondary | ICD-10-CM | POA: Diagnosis present

## 2018-11-26 DIAGNOSIS — F17210 Nicotine dependence, cigarettes, uncomplicated: Secondary | ICD-10-CM | POA: Diagnosis present

## 2018-11-26 DIAGNOSIS — Z20828 Contact with and (suspected) exposure to other viral communicable diseases: Secondary | ICD-10-CM | POA: Diagnosis present

## 2018-11-26 DIAGNOSIS — E876 Hypokalemia: Secondary | ICD-10-CM | POA: Diagnosis present

## 2018-11-26 DIAGNOSIS — R197 Diarrhea, unspecified: Secondary | ICD-10-CM | POA: Diagnosis present

## 2018-11-26 DIAGNOSIS — Z7982 Long term (current) use of aspirin: Secondary | ICD-10-CM | POA: Diagnosis not present

## 2018-11-26 DIAGNOSIS — I10 Essential (primary) hypertension: Secondary | ICD-10-CM | POA: Diagnosis present

## 2018-11-26 DIAGNOSIS — E860 Dehydration: Secondary | ICD-10-CM | POA: Diagnosis present

## 2018-11-26 DIAGNOSIS — A084 Viral intestinal infection, unspecified: Secondary | ICD-10-CM | POA: Diagnosis not present

## 2018-11-26 DIAGNOSIS — Z7901 Long term (current) use of anticoagulants: Secondary | ICD-10-CM | POA: Diagnosis not present

## 2018-11-26 DIAGNOSIS — E861 Hypovolemia: Secondary | ICD-10-CM | POA: Diagnosis present

## 2018-11-26 DIAGNOSIS — Z9181 History of falling: Secondary | ICD-10-CM | POA: Diagnosis not present

## 2018-11-26 DIAGNOSIS — Z8249 Family history of ischemic heart disease and other diseases of the circulatory system: Secondary | ICD-10-CM | POA: Diagnosis not present

## 2018-11-26 LAB — CBC WITH DIFFERENTIAL/PLATELET
Abs Immature Granulocytes: 0.02 10*3/uL (ref 0.00–0.07)
Basophils Absolute: 0 10*3/uL (ref 0.0–0.1)
Basophils Relative: 1 %
Eosinophils Absolute: 0.1 10*3/uL (ref 0.0–0.5)
Eosinophils Relative: 1 %
HCT: 33.5 % — ABNORMAL LOW (ref 36.0–46.0)
Hemoglobin: 10.1 g/dL — ABNORMAL LOW (ref 12.0–15.0)
Immature Granulocytes: 0 %
Lymphocytes Relative: 27 %
Lymphs Abs: 1.8 10*3/uL (ref 0.7–4.0)
MCH: 21.9 pg — ABNORMAL LOW (ref 26.0–34.0)
MCHC: 30.1 g/dL (ref 30.0–36.0)
MCV: 72.5 fL — ABNORMAL LOW (ref 80.0–100.0)
Monocytes Absolute: 0.6 10*3/uL (ref 0.1–1.0)
Monocytes Relative: 8 %
Neutro Abs: 4.1 10*3/uL (ref 1.7–7.7)
Neutrophils Relative %: 63 %
Platelets: 212 10*3/uL (ref 150–400)
RBC: 4.62 MIL/uL (ref 3.87–5.11)
RDW: 16.7 % — ABNORMAL HIGH (ref 11.5–15.5)
WBC: 6.6 10*3/uL (ref 4.0–10.5)
nRBC: 0 % (ref 0.0–0.2)

## 2018-11-26 LAB — COMPREHENSIVE METABOLIC PANEL
ALT: 22 U/L (ref 0–44)
AST: 23 U/L (ref 15–41)
Albumin: 3.3 g/dL — ABNORMAL LOW (ref 3.5–5.0)
Alkaline Phosphatase: 78 U/L (ref 38–126)
Anion gap: 5 (ref 5–15)
BUN: 20 mg/dL (ref 8–23)
CO2: 21 mmol/L — ABNORMAL LOW (ref 22–32)
Calcium: 8.9 mg/dL (ref 8.9–10.3)
Chloride: 114 mmol/L — ABNORMAL HIGH (ref 98–111)
Creatinine, Ser: 1.06 mg/dL — ABNORMAL HIGH (ref 0.44–1.00)
GFR calc Af Amer: >60 mL/min (ref >60)
GFR calc non Af Amer: 56 mL/min — ABNORMAL LOW (ref >60)
Glucose, Bld: 87 mg/dL (ref 70–99)
Potassium: 3.8 mmol/L (ref 3.5–5.1)
Sodium: 140 mmol/L (ref 135–145)
Total Bilirubin: 0.5 mg/dL (ref 0.3–1.2)
Total Protein: 5.9 g/dL — ABNORMAL LOW (ref 6.5–8.1)

## 2018-11-26 MED ORDER — BOOST / RESOURCE BREEZE PO LIQD CUSTOM
1.0000 | Freq: Three times a day (TID) | ORAL | Status: DC
  Administered 2018-11-26: 1 via ORAL

## 2018-11-26 MED ORDER — ADULT MULTIVITAMIN W/MINERALS CH
1.0000 | ORAL_TABLET | Freq: Every day | ORAL | Status: DC
  Administered 2018-11-27 – 2018-11-28 (×2): 1 via ORAL
  Filled 2018-11-26 (×2): qty 1

## 2018-11-26 MED ORDER — MORPHINE SULFATE (PF) 2 MG/ML IV SOLN
1.0000 mg | INTRAVENOUS | Status: DC | PRN
  Administered 2018-11-26: 1 mg via INTRAVENOUS
  Filled 2018-11-26: qty 1

## 2018-11-26 NOTE — Evaluation (Signed)
Physical Therapy Evaluation Patient Details Name: Kimberly [name] MRN: [id number] DOB: 1956/04/24 Today's Date: 11/26/2018   History of Present Illness  63 yo admitted with diarrhea and Afib, covid (-) PMhx: CVA, HTN, HLD, Afib, CAD, CABG, gout  Clinical Impression  Pt in room initially hesitant to mobility but after role of therapy explained pt willing to participate. Pt reports she has an aides 2hrs/day 7days a week and that she functions from bed or W/C level whenever the aide is not present and only walks when someone else is there. Pt has assist for homemaking and meals but unable to confirm. Pt with noted cognitive, balance, strength, and gait deficits who will benefit from acute therapy to maximize safety, mobility and independence. SNF recommended if pt does not have assist at home 7days a week and if her report of PLOF and DME is inaccurate. Pt reports she will refuse any SNF option but does desire ALF and is amenable to HHPT. Will continue to follow to assess home situation and function. Pt reported dizziness with mobility with VSS  Supine 117/88 Sitting 138/95 Standing 128/90    Follow Up Recommendations Home health PT;Supervision for mobility/OOB;SNF    Equipment Recommendations  None recommended by PT    Recommendations for Other Services OT consult     Precautions / Restrictions Precautions Precautions: Fall      Mobility  Bed Mobility Overal bed mobility: Modified Independent             General bed mobility comments: increased time with pt exiting/entering left side of bed, bed flat no rail   Transfers Overall transfer level: Needs assistance   Transfers: Sit to/from Stand;Stand Pivot Transfers Sit to Stand: Min guard Stand pivot transfers: Min guard       General transfer comment: supervision for lines and cues for sequence for pivot and hand placement, momentum to rise with increased time  Ambulation/Gait Ambulation/Gait assistance: Min  guard Gait Distance (Feet): 10 Feet Assistive device: Quad cane Gait Pattern/deviations: Step-to pattern;Decreased dorsiflexion - left   Gait velocity interpretation: <1.8 ft/sec, indicate of risk for recurrent falls General Gait Details: pt walked with hemiparetic gait around bed to chair but refused to stay in chair despite conversation beginning of session for plan. Pt then walked back around bed to [facility] then to bed. Pt with slow controlled gait with use of quad cane  Stairs            Wheelchair Mobility    Modified Rankin (Stroke Patients Only)       Balance Overall balance assessment: History of Falls;Needs assistance   Sitting balance-Leahy Scale: Fair     Standing balance support: Single extremity supported Standing balance-Leahy Scale: Poor Standing balance comment: pt requires UE support to maintain standing with guarding for stability and safety                             Pertinent Vitals/Pain Pain Assessment: No/denies pain    Home Living Family/patient expects to be discharged to:: Private residence Living Arrangements: Alone Available Help at Discharge: Personal care attendant Type of Home: Apartment Home Access: Stairs to enter Entrance Stairs-Rails: None Entrance Stairs-Number of Steps: 2 Home Layout: One level Home Equipment: Warner - manual;Walker - 2 wheels Additional Comments: pt reports she fell and her glasses broke since which time she has had no glasses and difficulty with vision    Prior Function Level of Independence: Needs assistance  Gait / Transfers Assistance Needed: pt reports she gets OOB to W/C, W/C to RW for bathroom, walks with quad cane with aide is present  ADL's / Homemaking Assistance Needed: family does the shopping and assists with cooking along with meals on wheels, aide for housework. Pt reports she dresses, toilets on her own        Hand Dominance        Extremity/Trunk Assessment    Upper Extremity Assessment Upper Extremity Assessment: Generalized weakness    Lower Extremity Assessment Lower Extremity Assessment: Generalized weakness(decreased control of LLE with decreased dorsiflexion and hemiparetic gait)    Cervical / Trunk Assessment Cervical / Trunk Assessment: Normal  Communication   Communication: No difficulties  Cognition Arousal/Alertness: Awake/alert Behavior During Therapy: Agitated Overall Cognitive Status: No family/caregiver present to determine baseline cognitive functioning                                 General Comments: pt with decreased memory, problem solving and has a generally agitated demeanor but will express appreciation with increased time and repetition to clearly understand pt needs/desires      General Comments      Exercises     Assessment/Plan    PT Assessment Patient needs continued PT services  PT Problem List Decreased strength;Decreased balance;Decreased cognition;Decreased mobility;Decreased activity tolerance;Decreased safety awareness;Decreased coordination       PT Treatment Interventions DME instruction;Functional mobility training;Balance training;Patient/family education;Gait training;Therapeutic activities;Stair training;Therapeutic exercise;Cognitive remediation;Neuromuscular re-education    PT Goals (Current goals can be found in the Care Plan section)  Acute Rehab PT Goals Patient Stated Goal: return home and get an affordable place PT Goal Formulation: With patient Time For Goal Achievement: 12/10/18 Potential to Achieve Goals: Fair    Frequency Min 3X/week   Barriers to discharge Decreased caregiver support      Co-evaluation               AM-PAC PT "6 Clicks" Mobility  Outcome Measure Help needed turning from your back to your side while in a flat bed without using bedrails?: A Little Help needed moving from lying on your back to sitting on the side of a flat bed without  using bedrails?: A Little Help needed moving to and from a bed to a chair (including a wheelchair)?: A Little Help needed standing up from a chair using your arms (e.g., wheelchair or bedside chair)?: A Little Help needed to walk in hospital room?: A Little Help needed climbing 3-5 steps with a railing? : A Lot 6 Click Score: 17    End of Session Equipment Utilized During Treatment: Gait belt Activity Tolerance: Patient tolerated treatment well Patient left: in bed;with bed alarm set;with call bell/phone within reach Nurse Communication: Mobility status;Precautions PT Visit Diagnosis: Other abnormalities of gait and mobility (R26.89);History of falling (Z91.81);Muscle weakness (generalized) (M62.81);Unsteadiness on feet (R26.81);Other symptoms and signs involving the nervous system (R29.898)    Time: 1443-1540 PT Time Calculation (min) (ACUTE ONLY): 44 min   Charges:   PT Evaluation $PT Eval Moderate Complexity: 1 Mod PT Treatments $Gait Training: 8-22 mins $Therapeutic Activity: 8-22 mins        Koya Hunger Pam Drown, PT Acute Rehabilitation Services Pager: [phone] Office: [place] 11/26/2018, 1:19 PM

## 2018-11-26 NOTE — Progress Notes (Signed)
Due to departmental directions, we are not completing Advance Directives at this time.  Chaplain Dr Vernia Teem 

## 2018-11-26 NOTE — Progress Notes (Signed)
Initial Nutrition Assessment  RD working remotely.  DOCUMENTATION CODES:   Not applicable  INTERVENTION:   -D/c Ensure Max -Boost Breeze po TID, each supplement provides 250 kcal and 9 grams of protein -MVI with minerals daily -RD will follow for diet advancement and supplement as appropriate  NUTRITION DIAGNOSIS:   Inadequate oral intake related to altered GI function as evidenced by per patient/family report.  GOAL:   Patient will meet greater than or equal to 90% of their needs  MONITOR:   PO intake, Supplement acceptance, Diet advancement, Labs, Weight trends, Skin, I & O's  REASON FOR ASSESSMENT:   Malnutrition Screening Tool    ASSESSMENT:   Kimberly [name] is a 63 y.o. female with medical history significant of CVA; HTN; HLD; afib on Eliquis; and CAD s/p CABG x 4 presenting with diarrhea.  She reports several days of non-bloody stools happening 3-4 times daily; cough; subjective fever; and generalized body aches.  She denies sick contacts but does have a caregiver.  Pt admitted with diarrhea and mild dehydration, likely due to viral gastroenteritis.   Reviewed I/O's: + 2 L x 24 hours  UOP: 500 ml x 24 hours  Attempted to call pt x 2, however, did not receive response. Heard dial tone on phone at both attempts. Unable to obtain further nutrition-related history at this time.  Reviewed wt hx; noted wt has been stable over the past past, however, pt with history of distant wt loss.   Pt currently on a clear liquid diet, consumed 100% of dinner yesterday. Per chart review, pt is not requesting foods or liquids. Suspect poor oral intake and will add nutritional supplements due to limitations of clear liquid diet.   Labs reviewed.   Diet Order:   Diet Order            Diet clear liquid Room service appropriate? Yes; Fluid consistency: Thin  Diet effective now              EDUCATION NEEDS:   No education needs have been identified at this time  Skin:   Skin Assessment: Reviewed RN Assessment  Last BM:  11/26/18  Height:   Ht Readings from Last 1 Encounters:  11/25/18 5\' 4"  (1.626 m)    Weight:   Wt Readings from Last 1 Encounters:  11/26/18 55 kg    Ideal Body Weight:  54.5 kg  BMI:  Body mass index is 20.8 kg/m.  Estimated Nutritional Needs:   Kcal:  1500-1700  Protein:  70-85 grams  Fluid:  > 1.5 L    Salik Grewell A. Jimmye Norman, RD, LDN, [place] Registered Dietitian II Certified Diabetes Care and Education Specialist Pager: [phone] After hours Pager: [phone]

## 2018-11-26 NOTE — Clinical Social Work Note (Signed)
CSW acknowledges consult for ALF placement. We typically do not place patients in ALF's due to amount of time it takes and patient is currently under observation status. We usually provide a list of local ALF's for patient and family to research and visit before making a choice. There is an order for PT to evaluate patient. If they recommend SNF and patient is agreeable to going, the social worker at the SNF can work on ALF placement. Patient would have to be willing to give up the majority of her check which will go directly to the facility. This is for both SNF and ALF.  Dayton Scrape, [place]

## 2018-11-26 NOTE — Progress Notes (Signed)
Pt was received to 3E 22, alert and oriented upon arrival from the ED.  Pt placed on telemetry.  Vitals signs stable without any signs of respiratory distress noted.  Pt. Settled in bed and has been oriented to the room with HOB@ 30 degrees.

## 2018-11-26 NOTE — Discharge Instructions (Addendum)

## 2018-11-26 NOTE — Progress Notes (Signed)
Patient refusing labs. States she is not getting stuck anymore. Patient educated. Family called to speak with patient. Patient adamant she is not getting her blood drawn.

## 2018-11-26 NOTE — Progress Notes (Signed)
Patient vomited, MD paged-IV pain med ordered and abd study.

## 2018-11-26 NOTE — Plan of Care (Signed)
  Problem: Education: Goal: Knowledge of General Education information will improve Description Including pain rating scale, medication(s)/side effects and non-pharmacologic comfort measures Outcome: Progressing   Problem: Health Behavior/Discharge Planning: Goal: Ability to manage health-related needs will improve Outcome: Progressing   

## 2018-11-26 NOTE — Progress Notes (Signed)
[facility] PROGRESS NOTE  Kimberly [name] JQB:[id number] DOB: 03/15/1956 DOA: 11/25/2018 PCP: [name], FNP  Assessment/Plan:  Diarrhea with mild dehydration continues with loose stool and nausea with abdominal pain. 4 episodes diarrhea since admit. Does request food inspite of pain and nausea. Labs pending as she initially refused. Stool studies have not been sent. Presumed viral gastroenteritis. She is afebrile and non-toxic appearing.  -change diet to clear liquids for bowel rest -continue gentle IV fluids -zofran prn  Afib with RVR  -Patient with afib with EMS but this converted to NSR prior to arrival -She does not appear to be on a rate-controlling medication at home; this could be considered prior to d/c or as an outpatient -Continue Eliquis  CAD s/p CABG no chest pain. No events on tele.  -Continue ASA  S/p CVA -Residual weakness -Has an in-home caregiver -PT eval  HTN -Initially BP soft and home medications held. Remains on low end of normal -continue iv fluids -hold norvasc and lisinopril  -Consider restarting Norvasc as indicated -Consider holding Chlorthalidone and Lisinopril longer to enable renal recovery  AKI . Likely related to decreased po intake in setting of persistent diarrhea. Labs pending this am -continue gentle fluids -hold nephrotoxins -monitor urine output -recheck in am  HLD -Continue Lipitor  Hemiparesis affecting nondominant 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 . Chart review indicates Left upper and lower extremity hemiparesis from previous stroke. Reports frequent fall. Has caregiver M-F -PT -social work may benefit placement     Code Status: full Family Communication: none present Disposition Plan: to be determined.    Consultants:    Procedures:    Antibiotics:    HPI/Subjective: Patient quite cranky this am. Complains persistent diarrhea and nausea with abdominal pain. Requesting food.    Admitted 11/25/18 with presumed viral gastroenteritis.   Objective: Vitals:   11/26/18 0513 11/26/18 0809  BP: 103/82 104/68  Pulse: 75 68  Resp: 18 18  Temp: 97.8 F (36.6 C) 98.1 F (36.7 C)  SpO2: 98% 100%    Intake/Output Summary (Last 24 hours) at 11/26/2018 0956 Last data filed at 11/26/2018 0908 Gross per 24 hour  Intake 2736.46 ml  Output 500 ml  Net 2236.46 ml   Filed Weights   11/25/18 1234 11/25/18 2045 11/26/18 0513  Weight: 57.6 kg 55.5 kg 55 kg    Exam:   General:  Sitting up in bed watching tv. No acute distress  Cardiovascular: rrr no mgr no LE edema  Respiratory: normal effort BS clear bilaterally no wheeze no crackles  Abdomen: non-distended, soft sluggish BS mild diffuse tenderness to palpation. No guarding or rebounding  Musculoskeletal: joints with swelling/erythema   Data Reviewed: Basic Metabolic Panel: Recent Labs  Lab 11/25/18 1300  NA 139  K 4.2  CL 108  CO2 19*  GLUCOSE 94  BUN 28*  CREATININE 1.53*  CALCIUM 9.8   Liver Function Tests: Recent Labs  Lab 11/25/18 1300  AST 26  ALT 26  ALKPHOS 99  BILITOT 0.6  PROT 6.8  ALBUMIN 4.1   Recent Labs  Lab 11/25/18 1300  LIPASE 23   No results for input(s): AMMONIA in the last 168 hours. CBC: Recent Labs  Lab 11/25/18 1300 11/26/18 0901  WBC 7.5 6.6  NEUTROABS 4.8 4.1  HGB 11.7* 10.1*  HCT 40.2 33.5*  MCV 74.2* 72.5*  PLT 209 212   Cardiac Enzymes: Recent Labs  Lab 11/25/18 1300  TROPONINI <0.03   BNP (last 3 results) No results  for input(s): BNP in the last 8760 hours.  ProBNP (last 3 results) No results for input(s): PROBNP in the last 8760 hours.  CBG: No results for input(s): GLUCAP in the last 168 hours.  Recent Results (from the past 240 hour(s))  SARS Coronavirus 2 [facility] order, Performed in [place] hospital lab)     Status: None   Collection Time: 11/25/18  4:41 PM  Result Value Ref Range Status   SARS Coronavirus 2 NEGATIVE NEGATIVE  Final    Comment: (NOTE) If result is NEGATIVE SARS-CoV-2 target nucleic acids are NOT DETECTED. The SARS-CoV-2 RNA is generally detectable in upper and lower  respiratory specimens during the acute phase of infection. The lowest  concentration of SARS-CoV-2 viral copies this assay can detect is 250  copies / mL. A negative result does not preclude SARS-CoV-2 infection  and should not be used as the sole basis for treatment or other  patient management decisions.  A negative result may occur with  improper specimen collection / handling, submission of specimen other  than nasopharyngeal swab, presence of viral mutation(s) within the  areas targeted by this assay, and inadequate number of viral copies  (<250 copies / mL). A negative result must be combined with clinical  observations, patient history, and epidemiological information. If result is POSITIVE SARS-CoV-2 target nucleic acids are DETECTED. The SARS-CoV-2 RNA is generally detectable in upper and lower  respiratory specimens dur ing the acute phase of infection.  Positive  results are indicative of active infection with SARS-CoV-2.  Clinical  correlation with patient history and other diagnostic information is  necessary to determine patient infection status.  Positive results do  not rule out bacterial infection or co-infection with other viruses. If result is PRESUMPTIVE POSTIVE SARS-CoV-2 nucleic acids MAY BE PRESENT.   A presumptive positive result was obtained on the submitted specimen  and confirmed on repeat testing.  While 2019 novel coronavirus  (SARS-CoV-2) nucleic acids may be present in the submitted sample  additional confirmatory testing may be necessary for epidemiological  and / or clinical management purposes  to differentiate between  SARS-CoV-2 and other Sarbecovirus currently known to infect humans.  If clinically indicated additional testing with an alternate test  methodology [phone]) is advised. The  SARS-CoV-2 RNA is generally  detectable in upper and lower respiratory sp ecimens during the acute  phase of infection. The expected result is Negative. Fact Sheet for Patients:  StrictlyIdeas.no Fact Sheet for Healthcare Providers: BankingDealers.co.za This test is not yet approved or cleared by the [place] FDA and has been authorized for detection and/or diagnosis of SARS-CoV-2 by FDA under an Emergency Use Authorization (EUA).  This EUA will remain in effect (meaning this test can be used) for the duration of the COVID-19 declaration under Section 564(b)(1) of the Act, 21 U.S.C. section 360bbb-3(b)(1), unless the authorization is terminated or revoked sooner. Performed at [facility], [place] [address], [place], [place] [address]      Studies: Dg Chest Port 1 View  Result Date: 11/25/2018 CLINICAL DATA:  Cough and diarrhea over the last week. EXAM: PORTABLE CHEST 1 VIEW COMPARISON:  03/24/2018 FINDINGS: Previous median sternotomy and CABG. Pulmonary vascularity is normal. There is atherosclerosis of the aorta. The lungs are clear. No effusions. No acute bone finding. IMPRESSION: No active disease.  Previous CABG.  Aortic atherosclerosis. Electronically Signed   By: Nelson Chimes M.D.   On: 11/25/2018 13:34    Scheduled Meds: . apixaban  5  mg Oral BID  . aspirin  81 mg Oral Daily  . atorvastatin  80 mg Oral Daily  . colchicine  0.6 mg Oral BID  . nicotine  21 mg Transdermal QHS  . pantoprazole  40 mg Oral Daily  . Ensure Max Protein  237 mL Oral BID BM  . sodium chloride flush  3 mL Intravenous Q12H   Continuous Infusions: . sodium chloride 75 mL/hr at 11/26/18 0906    Principal Problem:   Diarrhea Active Problems:   Atrial fibrillation with RVR (HCC)   Coronary artery disease involving native coronary artery of native heart without angina pectoris   Dehydration, mild   Dyslipidemia   Essential hypertension    Hemiparesis affecting nondominant 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 [facility])    Time spent: 27 minutes    [facility] M NP  [facility]  If 7PM-7AM, please contact night-coverage at [email or web address], password [facility] 11/26/2018, 9:56 AM  LOS: 0 days

## 2018-11-27 DIAGNOSIS — N179 Acute kidney failure, unspecified: Secondary | ICD-10-CM

## 2018-11-27 LAB — CBC WITH DIFFERENTIAL/PLATELET
Abs Immature Granulocytes: 0.01 10*3/uL (ref 0.00–0.07)
Basophils Absolute: 0 10*3/uL (ref 0.0–0.1)
Basophils Relative: 1 %
Eosinophils Absolute: 0.1 10*3/uL (ref 0.0–0.5)
Eosinophils Relative: 1 %
HCT: 31.5 % — ABNORMAL LOW (ref 36.0–46.0)
Hemoglobin: 9.4 g/dL — ABNORMAL LOW (ref 12.0–15.0)
Immature Granulocytes: 0 %
Lymphocytes Relative: 31 %
Lymphs Abs: 1.8 10*3/uL (ref 0.7–4.0)
MCH: 21.4 pg — ABNORMAL LOW (ref 26.0–34.0)
MCHC: 29.8 g/dL — ABNORMAL LOW (ref 30.0–36.0)
MCV: 71.6 fL — ABNORMAL LOW (ref 80.0–100.0)
Monocytes Absolute: 0.7 10*3/uL (ref 0.1–1.0)
Monocytes Relative: 12 %
Neutro Abs: 3.4 10*3/uL (ref 1.7–7.7)
Neutrophils Relative %: 55 %
Platelets: 167 10*3/uL (ref 150–400)
RBC: 4.4 MIL/uL (ref 3.87–5.11)
RDW: 16.5 % — ABNORMAL HIGH (ref 11.5–15.5)
WBC: 6 10*3/uL (ref 4.0–10.5)
nRBC: 0 % (ref 0.0–0.2)

## 2018-11-27 LAB — COMPREHENSIVE METABOLIC PANEL
ALT: 20 U/L (ref 0–44)
AST: 20 U/L (ref 15–41)
Albumin: 3.1 g/dL — ABNORMAL LOW (ref 3.5–5.0)
Alkaline Phosphatase: 73 U/L (ref 38–126)
Anion gap: 9 (ref 5–15)
BUN: 11 mg/dL (ref 8–23)
CO2: 21 mmol/L — ABNORMAL LOW (ref 22–32)
Calcium: 9 mg/dL (ref 8.9–10.3)
Chloride: 109 mmol/L (ref 98–111)
Creatinine, Ser: 1.02 mg/dL — ABNORMAL HIGH (ref 0.44–1.00)
GFR calc Af Amer: >60 mL/min (ref >60)
GFR calc non Af Amer: 59 mL/min — ABNORMAL LOW (ref >60)
Glucose, Bld: 86 mg/dL (ref 70–99)
Potassium: 3.3 mmol/L — ABNORMAL LOW (ref 3.5–5.1)
Sodium: 139 mmol/L (ref 135–145)
Total Bilirubin: 0.5 mg/dL (ref 0.3–1.2)
Total Protein: 5.6 g/dL — ABNORMAL LOW (ref 6.5–8.1)

## 2018-11-27 MED ORDER — MITIGARE 0.6 MG PO CAPS: 0.6000 mg | ORAL_CAPSULE | Freq: Two times a day (BID) | ORAL | Status: DC | PRN

## 2018-11-27 MED ORDER — POTASSIUM CHLORIDE 10 MEQ/100ML IV SOLN
10.0000 meq | INTRAVENOUS | Status: DC
  Administered 2018-11-27 (×2): 10 meq via INTRAVENOUS
  Filled 2018-11-27: qty 100

## 2018-11-27 MED ORDER — POTASSIUM CHLORIDE CRYS ER 20 MEQ PO TBCR: 40.0000 meq | EXTENDED_RELEASE_TABLET | Freq: Once | ORAL | Status: DC

## 2018-11-27 MED ORDER — ENSURE ENLIVE PO LIQD
237.0000 mL | Freq: Two times a day (BID) | ORAL | Status: DC
  Administered 2018-11-28 (×2): 237 mL via ORAL

## 2018-11-27 NOTE — Progress Notes (Signed)
[facility] PROGRESS NOTE  Kimberly [name] ZOX:[id number] DOB: September 20, 1955 DOA: 11/25/2018 PCP: [name], FNP   Admitted 11/25/18 with presumed viral gastroenteritis.    Assessment/Plan:  Diarrhea with mild dehydration -Presumed viral gastroenteritis -She is afebrile and non-toxic appearing.  -have been unable to get stool sample -advance diet as tolerated  Afib with RVR  -Patient with afib with EMS but this converted to NSR prior to arrival -She does not appear to be on a rate-controlling medication at home; this could be considered prior to d/c or as an outpatient -Continue Eliquis  CAD s/p CABG no chest pain. No events on tele.  -Continue ASA  S/p CVA -Residual weakness -Has an in-home caregiver -PT eval- home health  HTN -Initially BP soft and home medications held. Remains on low end of normal -continue to hold BP medications  AKI . Likely related to decreased po intake in setting of persistent diarrhea -slowly improving  HLD -Continue Lipitor  Hemiparesis affecting nondominant 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 . Chart review indicates Left upper and lower extremity hemiparesis from previous stroke. Reports frequent fall. Has caregiver M-F  Hypokalemia -repleted   Code Status: full Family Communication: none present Disposition Plan: to be determined.    Consultants:    Procedures:    Antibiotics:    HPI/Subjective: Tolerated clear liquids this AM    Objective: Vitals:   11/27/18 0523 11/27/18 0749  BP: 109/87 99/79  Pulse: 75 78  Resp: 17 16  Temp: 97.9 F (36.6 C) 98.3 F (36.8 C)  SpO2: 100% 100%    Intake/Output Summary (Last 24 hours) at 11/27/2018 1049 Last data filed at 11/27/2018 1014 Gross per 24 hour  Intake 510 ml  Output 950 ml  Net -440 ml   Filed Weights   11/25/18 2045 11/26/18 0513 11/27/18 0523  Weight: 55.5 kg 55 kg 55.2 kg    Exam:   General:  Sitting up in bed watching  tv. No acute distress  Cardiovascular: rrr no mgr no LE edema  Respiratory: normal effort BS clear bilaterally no wheeze no crackles  Abdomen: non-distended, soft sluggish BS mild diffuse tenderness to palpation. No guarding or rebounding  Musculoskeletal: joints with swelling/erythema   Data Reviewed: Basic Metabolic Panel: Recent Labs  Lab 11/25/18 1300 11/26/18 0901 11/27/18 0620  NA 139 140 139  K 4.2 3.8 3.3*  CL 108 114* 109  CO2 19* 21* 21*  GLUCOSE 94 87 86  BUN 28* 20 11  CREATININE 1.53* 1.06* 1.02*  CALCIUM 9.8 8.9 9.0   Liver Function Tests: Recent Labs  Lab 11/25/18 1300 11/26/18 0901 11/27/18 0620  AST 26 23 20   ALT 26 22 20   ALKPHOS 99 78 73  BILITOT 0.6 0.5 0.5  PROT 6.8 5.9* 5.6*  ALBUMIN 4.1 3.3* 3.1*   Recent Labs  Lab 11/25/18 1300  LIPASE 23   No results for input(s): AMMONIA in the last 168 hours. CBC: Recent Labs  Lab 11/25/18 1300 11/26/18 0901 11/27/18 0620  WBC 7.5 6.6 6.0  NEUTROABS 4.8 4.1 3.4  HGB 11.7* 10.1* 9.4*  HCT 40.2 33.5* 31.5*  MCV 74.2* 72.5* 71.6*  PLT 209 212 167   Cardiac Enzymes: Recent Labs  Lab 11/25/18 1300  TROPONINI <0.03   BNP (last 3 results) No results for input(s): BNP in the last 8760 hours.  ProBNP (last 3 results) No results for input(s): PROBNP in the last 8760 hours.  CBG: No results for input(s): GLUCAP in the last 168  hours.  Recent Results (from the past 240 hour(s))  SARS Coronavirus 2 [facility] order, Performed in [place] hospital lab)     Status: None   Collection Time: 11/25/18  4:41 PM  Result Value Ref Range Status   SARS Coronavirus 2 NEGATIVE NEGATIVE Final    Comment: (NOTE) If result is NEGATIVE SARS-CoV-2 target nucleic acids are NOT DETECTED. The SARS-CoV-2 RNA is generally detectable in upper and lower  respiratory specimens during the acute phase of infection. The lowest  concentration of SARS-CoV-2 viral copies this assay can detect is 250  copies / mL. A  negative result does not preclude SARS-CoV-2 infection  and should not be used as the sole basis for treatment or other  patient management decisions.  A negative result may occur with  improper specimen collection / handling, submission of specimen other  than nasopharyngeal swab, presence of viral mutation(s) within the  areas targeted by this assay, and inadequate number of viral copies  (<250 copies / mL). A negative result must be combined with clinical  observations, patient history, and epidemiological information. If result is POSITIVE SARS-CoV-2 target nucleic acids are DETECTED. The SARS-CoV-2 RNA is generally detectable in upper and lower  respiratory specimens dur ing the acute phase of infection.  Positive  results are indicative of active infection with SARS-CoV-2.  Clinical  correlation with patient history and other diagnostic information is  necessary to determine patient infection status.  Positive results do  not rule out bacterial infection or co-infection with other viruses. If result is PRESUMPTIVE POSTIVE SARS-CoV-2 nucleic acids MAY BE PRESENT.   A presumptive positive result was obtained on the submitted specimen  and confirmed on repeat testing.  While 2019 novel coronavirus  (SARS-CoV-2) nucleic acids may be present in the submitted sample  additional confirmatory testing may be necessary for epidemiological  and / or clinical management purposes  to differentiate between  SARS-CoV-2 and other Sarbecovirus currently known to infect humans.  If clinically indicated additional testing with an alternate test  methodology [phone]) is advised. The SARS-CoV-2 RNA is generally  detectable in upper and lower respiratory sp ecimens during the acute  phase of infection. The expected result is Negative. Fact Sheet for Patients:  StrictlyIdeas.no Fact Sheet for Healthcare Providers: BankingDealers.co.za This test is not  yet approved or cleared by the [place] FDA and has been authorized for detection and/or diagnosis of SARS-CoV-2 by FDA under an Emergency Use Authorization (EUA).  This EUA will remain in effect (meaning this test can be used) for the duration of the COVID-19 declaration under Section 564(b)(1) of the Act, 21 U.S.C. section 360bbb-3(b)(1), unless the authorization is terminated or revoked sooner. Performed at [facility], [place] [address] [place], [place] [address]      Studies: Dg Chest Port 1 View  Result Date: 11/25/2018 CLINICAL DATA:  Cough and diarrhea over the last week. EXAM: PORTABLE CHEST 1 VIEW COMPARISON:  03/24/2018 FINDINGS: Previous median sternotomy and CABG. Pulmonary vascularity is normal. There is atherosclerosis of the aorta. The lungs are clear. No effusions. No acute bone finding. IMPRESSION: No active disease.  Previous CABG.  Aortic atherosclerosis. Electronically Signed   By: Nelson Chimes M.D.   On: 11/25/2018 13:34   Dg Abd Portable 1v  Result Date: 11/26/2018 CLINICAL DATA:  Intractable nausea and vomiting EXAM: PORTABLE ABDOMEN - 1 VIEW COMPARISON:  02/13/2018 FINDINGS: Scattered large and small bowel gas is noted. No obstructive changes are seen. No free air  is noted. No acute bony abnormality is noted. IMPRESSION: No acute abnormality seen. Electronically Signed   By: Inez Catalina M.D.   On: 11/26/2018 19:33    Scheduled Meds: . apixaban  5 mg Oral BID  . aspirin  81 mg Oral Daily  . atorvastatin  80 mg Oral Daily  . feeding supplement  1 Container Oral TID BM  . multivitamin with minerals  1 tablet Oral Daily  . nicotine  21 mg Transdermal QHS  . pantoprazole  40 mg Oral Daily  . potassium chloride  40 mEq Oral Once  . sodium chloride flush  3 mL Intravenous Q12H   Continuous Infusions: . sodium chloride 75 mL/hr at 11/26/18 2121    Principal Problem:   Diarrhea Active Problems:   Dyslipidemia   Essential hypertension   Atrial  fibrillation with RVR (HCC)   Coronary artery disease involving native coronary artery of native heart without angina pectoris   Hemiparesis affecting nondominant 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 ([place])   Dehydration, mild   AKI (acute kidney injury) ([place])    Time spent: 25 minutes    [facility]  If 7PM-7AM, please contact night-coverage at [email or web address], password [facility] 11/27/2018, 10:49 AM  LOS: 1 day

## 2018-11-27 NOTE — TOC Initial Note (Signed)
Transition of Care [facility]) - Initial/Assessment Note    Patient Details  Name: Kimberly [name] MRN: [id number] Date of Birth: 1955/09/10  Transition of [facility]) CM/SW Contact:    Royston Bake, RN Phone Number: 11/27/2018, 2:42 PM  Clinical Narrative:                 Patient lives at home, supportive family members; pt stated her PCP is Dr Lavone Nian; has private insurance with Medicaid with prescription drug coverage; pharmacy of choice is [facility]; DME - walker, cane and elevate commode chair at home; CM will continue to follow for DCP.  Expected Discharge Plan: [place] Barriers to Discharge: No Barriers Identified   Patient Goals and CMS Choice Patient states their goals for this hospitalization and ongoing recovery are:: to get out of here CMS Medicare.gov Compare Post Acute Care list provided to:: Patient Choice offered to / list presented to : Patient  Expected Discharge Plan and Services Expected Discharge Plan: [place] In-house Referral: NA Discharge Planning Services: CM Consult Post Acute Care Choice: [place] arrangements for the past 2 months: Single Family Home Expected Discharge Date: 11/28/18               DME Arranged: N/A DME Agency: NA HH Arranged: RN, PT [place] Agency: [facility] (formerly [employer])  Prior Living Arrangements/Services Living arrangements for the past 2 months: [place] with:: Self Patient language and need for interpreter reviewed:: No Do you feel safe going back to the place where you live?: Yes      Need for Family Participation in Patient Care: No (Comment) Care giver support system in place?: Yes (comment) Current home services: Homehealth aide Criminal Activity/Legal Involvement Pertinent to Current Situation/Hospitalization: No - Comment as needed  Activities of Daily [place] Devices/Equipment: Cane (specify quad or straight), Walker  (specify type), Wheelchair ADL Screening (condition at time of admission) Patient's cognitive ability adequate to safely complete daily activities?: Yes Is the patient deaf or have difficulty hearing?: No Does the patient have difficulty seeing, even when wearing glasses/contacts?: No Does the patient have difficulty concentrating, remembering, or making decisions?: No Patient able to express need for assistance with ADLs?: Yes Does the patient have difficulty dressing or bathing?: No Independently performs ADLs?: No Communication: Independent Dressing (OT): Needs assistance Is this a change from baseline?: Pre-admission baseline Grooming: Needs assistance Is this a change from baseline?: Pre-admission baseline Feeding: Needs assistance Is this a change from baseline?: Pre-admission baseline Bathing: Needs assistance Is this a change from baseline?: Pre-admission baseline In/Out Bed: Needs assistance Is this a change from baseline?: Pre-admission baseline Walks in Home: Needs assistance Is this a change from baseline?: Pre-admission baseline Does the patient have difficulty walking or climbing stairs?: Yes Weakness of Legs: Both Weakness of Arms/Hands: Both  Permission Sought/Granted Permission sought to share information with : Case Manager Permission granted to share information with : Yes, Verbal Permission Granted  Share Information with NAME: sister [name]  Permission granted to share info w AGENCY: [place] agency        Emotional Assessment Appearance:: Developmentally appropriate Attitude/Demeanor/Rapport: Gracious Affect (typically observed): Accepting Orientation: : Oriented to Self, Oriented to  Time, Oriented to Place, Oriented to Situation Alcohol / Substance Use: Not Applicable Psych Involvement: No (comment)  Admission diagnosis:  Dehydration [E86.0] Diarrhea, unspecified type [R19.7] Hypotension due to hypovolemia [I95.89, E86.1] Patient Active Problem List    Diagnosis  Date Noted  . AKI (acute kidney injury) ([place]) 11/26/2018  . Diarrhea 11/25/2018  . Dehydration, mild 11/25/2018  . Hemiparesis affecting nondominant 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 ([place]) 07/31/2018  . Muscle ache 07/31/2018  . Prediabetes 07/31/2018  . Chronic gout without tophus 07/31/2018  . Vision loss 07/31/2018  . Malnutrition of moderate degree 02/15/2018  . Carotid occlusion, bilateral   . Atrial fibrillation with RVR ([place]) 02/13/2018  . Positive D dimer 02/13/2018  . Back pain 02/13/2018  . CVA (cerebral vascular accident) ([place]) 02/13/2018  . Paroxysmal atrial fibrillation ([place]) 02/13/2018  . Coronary artery disease involving native coronary artery of native heart without angina pectoris 02/13/2018  . Apical variant hypertrophic cardiomyopathy ([place]) 02/13/2018  . 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 Aortic atherosclerosis ([place]) 02/13/2018  . CAD, ARTERY BYPASS GRAFT 05/03/2009  . TOBACCO USE, QUIT 05/03/2009  . ROTATOR CUFF SYNDROME, LEFT 02/12/2009  . Shoulder pain, left 11/03/2008  . HAMMER TOE, ACQUIRED 02/11/2008  . INSECT BITE, LEG 02/11/2008  . Cerebral artery occlusion with cerebral infarction ([place]) 12/05/2007  . Dyslipidemia 11/05/2007  . UNSPECIFIED DISORDER TEETH&SUPPORTING STRUCTURES 11/05/2007  . BICEPS TENDINITIS, RIGHT 04/24/2007  . Essential hypertension 04/23/2007  . MICROCYTOSIS 04/23/2007   PCP:  [name], [place] Pharmacy:   [facility] [place] [address] Phone: [phone] Fax: [facility] [address], [place] [address] [place] [address] Phone: [phone] Fax: [phone]  [id number] - [place], [place] - [address] [place] [address] Phone: [phone] Fax: [phone]     Social Determinants of Health (SDOH)  Interventions    Readmission Risk Interventions No flowsheet data found.

## 2018-11-27 NOTE — Progress Notes (Signed)
Nutrition Follow-up  RD working remotely.  DOCUMENTATION CODES:   Not applicable  INTERVENTION:   -D/c Boost Breeze po TID, each supplement provides 250 kcal and 9 grams of protein -Continue MVI with minerals daily -Ensure Enlive po BID, each supplement provides 350 kcal and 20 grams of protein  NUTRITION DIAGNOSIS:   Inadequate oral intake related to altered GI function as evidenced by per patient/family report.  Ongoing  GOAL:   Patient will meet greater than or equal to 90% of their needs  Progressing  MONITOR:   PO intake, Supplement acceptance, Diet advancement, Labs, Weight trends, Skin, I & O's  REASON FOR ASSESSMENT:   Malnutrition Screening Tool    ASSESSMENT:   Kimberly [name] is a 63 y.o. female with medical history significant of CVA; HTN; HLD; afib on Eliquis; and CAD s/p CABG x 4 presenting with diarrhea.  She reports several days of non-bloody stools happening 3-4 times daily; cough; subjective fever; and generalized body aches.  She denies sick contacts but does have a caregiver.  RD spoke with pt on phone, however, pt unable to questions at this time. RD attempted to introduce self, role, and rationale for call, but pt repeatedly stated "I don't understand; I can barely hear you".   Reviewed I/O's: +200 ml x 24 hours and +2.2 L since admission  UOP: 400 ml x 24 hours  Pt was on a clear liquid diet yesterday and consumed one Boost Breeze supplement. She was advanced to full liquid diet today. Will upgrade supplement to Ensure Enlive due to increased nutrient density.   Labs reviewed.   Diet Order:   Diet Order            Diet full liquid Room service appropriate? Yes; Fluid consistency: Thin  Diet effective now              EDUCATION NEEDS:   No education needs have been identified at this time  Skin:  Skin Assessment: Reviewed RN Assessment  Last BM:  11/26/18  Height:   Ht Readings from Last 1 Encounters:  11/25/18 5\' 4"  (1.626 m)     Weight:   Wt Readings from Last 1 Encounters:  11/27/18 55.2 kg    Ideal Body Weight:  54.5 kg  BMI:  Body mass index is 20.89 kg/m.  Estimated Nutritional Needs:   Kcal:  1500-1700  Protein:  70-85 grams  Fluid:  > 1.5 L    Zadkiel Dragan A. Jimmye Norman, RD, LDN, [place] Registered Dietitian II Certified Diabetes Care and Education Specialist Pager: [phone] After hours Pager: [phone]

## 2018-11-27 NOTE — Progress Notes (Signed)
Patient requests to speak with Dr. Eliseo Squires. Dr. Eliseo Squires notified that patient desires to speak with her.

## 2018-11-27 NOTE — Progress Notes (Signed)
HHC choice offered, pt requested [facility] [place] [facility] with [facility] called for arrangements. Kimberly [name] [phone]   [facility]  [phone]   [place] to my Favorites Quality of Patient Care Rating4 out of 5 stars Patient Survey Summary Rating4 out of Rayville  [phone]   Buckland to my Favorites Quality of Patient Care Rating3 out of 5 stars Patient Survey Summary Rating4 out of Salinas  [phone]   Blessing to my Rosemead  out of 5 stars Patient Survey Summary Rating4 out of Arnot  [phone]   County Center to my Favorites Quality of Patient Care Rating4  out of 5 stars Patient Survey Summary Rating3 out of Kingstree  [phone]   Hastings to my Favorites Quality of Patient Care Rating4  out of 5 stars Patient Survey Summary Rating4 out of Macy  [phone]   [place] to my Favorites Quality of Patient Care Rating4 out of 5 stars Patient Survey Summary Rating4 out of 5 stars  ENCOMPASS [place]  [phone]   Bartlett to my Favorites Quality of Patient Care Rating3  out of 5 stars Patient Survey Summary Rating3 out of Hermann  [phone]   Breckenridge to my Favorites Quality of Patient Care Rating3 out of 5 stars Patient Survey Summary Rating4 out of Lansing  [phone]   Calio to my Favorites Quality of Patient Care Rating3 out of 5 stars Patient Survey Summary Rating3 out of 5 stars  [facility]  [phone]) [phone]   Add [facility] to my Favorites Quality of Patient Care Rating4  out of 5 stars Not Available11  [facility]  (336) [phone]   Add [facility] to my Favorites Quality of Patient Care Rating3 out of 5 stars Patient Survey Summary Rating2 out of Harrisburg  [phone]   Rices Landing to my Hebron  out of 5 stars Not Goessel  [phone]   Perdido to my Favorites Quality of Patient Care Rating3  out of 5 stars Patient Survey Summary Rating4 out of Butlertown  [phone]   Cuyama to my Favorites Quality of Patient Care Rating4 out of 5 stars Patient Survey Summary Rating4 out of [place] Holly Springs  [phone]   Add [facility] to my Favorites Quality of Patient Care Rating3  out of 5 stars Patient Survey Summary Rating4 out of Hamilton  [phone]   Post to my Favorites Quality of Patient Care Rating3 out of 5 stars Not Motley  [phone]   Granite Hills to my Favorites Quality of Patient Care Rating4 out of 5 stars Patient Survey Summary Rating4 out of 5 stars  [facility]  [phone]   Whiting to my Favorites Quality of Patient Care Rating3 out of 5 stars Patient Survey Summary Rating4 out of Grandview Heights  [phone]   Bagnell to my Winthrop  out of 5 stars Patient Survey Summary Rating4 out of Silerton  [phone]   Trenton to my Favorites Quality of Patient Care Rating4  out of 5 stars Patient Survey Summary Rating3 out of 5 stars  Grass Lake  [phone]   East Camden to my Favorites Quality of Patient  Care Rating4  out of 5 stars Patient Survey Summary Rating4 out of 5 stars  Dutch John  [phone]   Major to my Favorites Quality of Patient Care Rating4 out of 5 stars Patient Survey Summary Rating4 out of 5 stars  ENCOMPASS [place]  [phone]   Ashland to my Favorites Quality of Patient Care Rating3  out of 5 stars Patient Survey Summary Rating3 out of Bethany  [phone]   Munford to my Favorites Quality of Patient Care Rating3 out of 5 stars Patient Survey Summary Rating4 out of Lovell  [phone]   Mayes to my Favorites Quality of Patient Care Rating3 out of 5 stars Patient Survey Summary Rating3 out of 5 stars  [facility]  [phone]   Add [facility] to my Favorites Quality of Patient Care Rating4 out of 5 stars Not Available11  [facility]  [phone]   Add [facility] to my Favorites Quality of Patient Care Rating3 out of 5 stars Patient Survey Summary Rating2 out of Greenbrier  [phone]   Elwood to my Mountain City  out of 5 stars Not Arcadia  [phone]   Big Sandy to my Favorites Quality of Patient Care Rating3  out of 5 stars Patient Survey Summary Berryville out of Nanawale Estates  [phone]   Shelburne Falls to my China out of 5 stars Patient Survey Summary Rating4 out of Floridatown  [phone]   Add [facility] to my Favorites Quality of Patient Care Rating3  out of 5 stars Patient Survey Summary Norris out of Elgin  [phone]   Add Hartsville to my Favorites Quality of Patient Care Rating3 out of 5 stars Not Long Barn  [phone]   Alta Sierra to my Favorites Quality of Patient Care Rating4 out of 5 stars Patient Survey Summary Rating4 out of Prosperity  [phone]   Grand View to my Favorites Quality of Patient Care Rating3 out of 5 stars Patient Survey Summary Rating4 out of Addis  [phone]   Tiawah to my Favorites Quality of Patient Care  Rating4  out of 5 stars Patient Survey Summary Rating4 out of 5 stars  [place]  [phone]   Walls to my Favorites Quality of Patient Care Rating4  out of 5 stars Patient Survey Summary Rating3 out of 5 stars  Three Lakes  [phone]   D[place] Pal[place] Y to my Favorites Quality of Patient Care Rating4  out of 5 stars Patient Survey Summary Rating4 out of 5 stars  Saronville  [phone]   Ware to my Favorites Quality of Patient Care Rating4 out of 5 stars Patient Survey Summary Rating4 out of 5 stars  ENCOMPASS Crest[place] Village  [phone]   Imlay City to my Favorites Quality of Patient Care Rating3  out of 5 stars Patient Survey Summary Rating3 out of Burkburnett  [phone]   Point Lay to my Favorites Quality of Patient Care Rating3 out of 5 stars Patient Survey Summary Rating4 out of Pitsburg  [phone]   Spaulding to my Favorites Quality of Patient Care Rating3 out of 5 stars Patient Survey Summary Rating3 out of 5 stars  [facility]  [phone]) [phone]   Add [facility] to my Favorites Quality of Patient Care Rating4 out of 5 stars  Not Available11  [facility]  [phone]   Add [facility] to my Favorites Quality of Patient Care Rating3 out of 5 stars Patient Survey Summary Rating2 out of Navajo  [phone]   Hope to my Lynnwood-Pricedale  out of 5 stars Not Hales Corners  [phone]   Chadbourn to my Favorites Quality of Patient Care Rating3  out of 5 stars Patient Survey Summary Hanover out of Redstone  [phone]   Fleming to my Favorites Quality of Patient Care Rating4 out of 5 stars Patient Survey Summary Webber out of Jakes Corner  [phone]   Add [facility] to my Favorites Quality of Patient Care Rating3  out of 5 stars Patient Survey Summary Aitkin out of Unity Village  [phone]   Add Elkhorn to my Favorites Quality of Patient Care Rating3 out of 5 stars Not Carpendale  [phone]   Auburn Lake Trails to my Favorites Quality of Patient Care Rating4 out of 5 stars Patient Survey Summary Rating4 out of Curryville  [phone]   Bogue to my Favorites Quality of Patient Care Rating3 out of 5 stars Patient Survey Summary Rating4 out of Wynnewood  [phone]   Granada to my Millbrook  out of 5 stars Patient Survey Summary Rating4 out of Estero  [phone]   Alden to my Favorites Quality of Patient Care Rating4  out of 5 stars Patient Survey Summary Rating3 out of 5 stars  Garfield  [phone]   Clinton to my Favorites Quality of Patient Care Rating4  out  of 5 stars Patient Survey Summary Rating4 out of 5 stars  Lowell  [phone]   Bancroft to my Favorites Quality of Patient Care Rating4 out of 5 stars Patient Survey Summary Rating4 out of 5 stars  ENCOMPASS Pound  [phone]   Walnut to my Favorites Quality of Patient Care Rating3  out of 5 stars Patient Survey Summary Rating3 out of 5 stars  Olivia  [phone]   Cabo Rojo to my Favorites Quality of Patient Care Rating3 out of 5 stars Patient Survey Summary Rating4 out of Spring Valley Village  [phone]   Young to my Favorites Quality of Patient Care Rating3 out of 5 stars Patient Survey Summary Fishers out of 5 stars  [facility]  [phone]) [phone]   Add [facility] to my Favorites Quality of Patient Care Rating4 out of 5 stars Not Available11  [facility]  [phone]   Add [facility] to my Favorites Quality of Patient Care Rating3 out of 5 stars Patient Survey Summary Rating2 out of Richland  [phone]   Manvel to my Eleanor  out of 5 stars Not Manning  [phone]   Toombs to my Favorites Quality of Patient Care Rating3  out of 5 stars Patient Survey Summary Clay out of Cloverdale  [phone]   Hoover to my Favorites Quality of Patient Care Rating4 out of 5 stars Patient Survey Summary Cresson out of Corriganville  [phone]   Add [facility] to my Favorites Quality of Patient Care Rating3  out of 5 stars Patient Survey Summary Baxter Estates out of Ridgeway  [phone]   Bearden to my Favorites  Quality of Patient Care Rating3 out of 5 stars Not Massena  [phone]   Junction City to my Favorites Quality of Patient Care Rating4 out of 5 stars Patient Survey Summary Rating4 out of Lindstrom  [phone]   Cambridge to my Favorites Quality of Patient Care Rating3 out of 5 stars Patient Survey Summary Rating4 out of Langdon  [phone]   Larkspur to my Graceton  out of 5 stars Patient Survey Summary Rating4 out of Jefferson  [phone]   Chesapeake to my Favorites Quality of Patient Care Rating4  out of 5 stars Patient Survey Summary Rating3 out of Coalgate  [phone]   Luna to my Favorites Quality of Patient Care Rating4  out of 5 stars Patient Survey Summary Rating4 out of Allisonia  [phone]   Beaver Creek  to my Favorites Quality of Patient Care Rating4 out of 5 stars Patient Survey Summary Rating4 out of 5 stars  ENCOMPASS Terre Haute  [phone]   Bunker Hill to my Favorites Quality of Patient Care Rating3  out of 5 stars Patient Survey Summary Rating3 out of Rampart  [phone]   Fairfield Beach to my Favorites Quality of Patient Care Rating3 out of 5 stars Patient Survey Summary Rating4 out of [place]e  [phone]   Flasher to my Favorites Quality of Patient Care Rating3 out of 5 stars Patient Survey Summary Rating3 out of 5 stars  [facility]  [phone]) [phone]   Add [facility] to my Favorites Quality of Patient Care Rating4 out of 5 stars Not Available11   [facility]  [phone]   Add [facility] to my Favorites Quality of Patient Care Rating3 out of 5 stars Patient Survey Summary Rating2 out of Paradise  [phone]   Bentley to my Moorhead  out of 5 stars Not Walker  [phone]   Stokesdale to my Favorites Quality of Patient Care Rating3  out of 5 stars Patient Survey Summary Munster out of Elizabethtown  [phone]   Hanapepe to my Favorites Quality of Patient Care Rating4 out of 5 stars Patient Survey Summary Des Lacs out of Falkville  [phone]   Add [facility] to my Favorites Quality of Patient Care Rating3  out of 5 stars Patient Survey Summary Gustine out of Beresford  [phone]   Add Senath to my Favorites Quality of Patient Care Rating3 out of 5 stars Not Bellevue  [phone]   White Marsh to my Favorites Quality of Patient Care Rating4 out of 5 stars Patient Survey Summary Rating4 out of Towaoc  [phone]   Butler to my Favorites Quality of Patient Care Rating3 out of 5 stars Patient Survey Summary Rating4 out of Glenfield  [phone]   Lodi to my Jewell  out of 5 stars Patient Survey Summary Rating4 out of Harbor Isle  [phone]   Coolidge to my Favorites Quality of Patient Care Rating4  out of 5 stars Patient Survey Summary Rating3 out of Kirksville  [phone]   Aransas Pass to my Favorites Quality of Patient Care Rating4  out of 5 stars  Patient Survey Summary Rating4 out of Vicksburg  [phone]   Atkinson to my Favorites Quality of Patient Care Rating4 out of 5 stars Patient Survey Summary Rating4 out of Cliff Village  [phone]   Add [facility] Mount Carmel to my Favorites Quality of Patient Care Rating3  out of 5 stars  Patient Survey Summary Rating3 out of Middletown  [phone]   Centertown to my Favorites Quality of Patient Care Rating3 out of 5 stars Patient Survey Summary Rating4 out of Richvale  [phone]   Adamstown to my Favorites Quality of Patient Care Rating3 out of 5 stars Patient Survey Summary Rating3 out of 5 stars  [facility]  [phone]) [phone]   Add [facility] to my Favorites Quality of Patient Care Rating4 out of 5 stars Not Available11  [facility]  [phone]   Add [facility] to my Favorites Quality of Patient Care Rating3 out of 5 stars Patient Survey Summary Rating2 out of 5 stars  Stanley  [phone]   Velva to my Waynesboro  out of 5 stars Not Sandborn  [phone]   Knightsen to my Favorites Quality of Patient Care Rating3  out of 5 stars Patient Survey Summary Piney Green out of Whitesboro  [phone]   Belleville to my Favorites Quality of Patient Care Rating4 out of 5 stars Patient Survey Summary Phoenix out of Young  [phone]   Add [facility] to my Favorites Quality of Patient Care Rating3  out of 5 stars Patient Survey Summary Allentown out of Negaunee  [phone]   Add Brices Creek to my Favorites Quality of  Patient Care Rating3 out of 5 stars Not Daykin  [phone]   Blenheim to my Favorites Quality of Patient Care Rating4 out of 5 stars Patient Survey Summary Rating4 out of Stoy  [phone]   Huachuca City to my Favorites Quality of Patient Care Rating3 out of 5 stars Patient Survey Summary Rating4 out of Chetopa  [phone]   Jerome to my Hollyvilla  out of 5 stars Patient Survey Summary Rating4 out of Coshocton  [phone]   Arrington to my Favorites Quality of Patient Care Rating4  out of 5 stars Patient Survey Summary Rating3 out of Elmore  [phone]   Add Bray to my Favorites Quality of Patient Care Rating4  out of 5 stars Patient Survey Summary Rating4 out of Fanning Springs  [phone]   Amaya to my Favorites Quality of Patient Care Rating4 out of 5 stars Patient Survey Summary Rating4 out of 5 stars  ENCOMPASS Grove  [phone]   Add ENCOMPASS [place]. [place] to my Favorites Quality of Patient Care Rating3  out of 5 stars Patient Survey Summary Rating3 out of Dana  [phone]   Oak City to my Favorites Quality of Patient Care Rating3 out of 5 stars Patient Survey Summary Rating4 out of Kirk  [phone]   Arlington  SERVICES to my Favorites Quality of Patient Care Rating3 out of 5 stars Patient Survey Summary Rating3 out of 5 stars  [facility]  [phone]) [phone]   Add [facility] to my Favorites Quality of Patient Care Rating4 out of 5 stars Not Available11  [facility]  [phone]   Add [facility] to my Favorites Quality of Patient Care Rating3 out of 5 stars Patient Survey Summary Rating2 out of 5 stars  Palmer  [phone]   Laurium to my Dauphin Island  out of 5 stars Not Janesville  [phone]   Ashburn to my Favorites Quality of Patient Care Rating3  out of 5 stars Patient Survey Summary Easton out of Oak  [phone]   Battlement Mesa to my Favorites Quality of Patient Care Rating4 out of 5 stars Patient Survey Summary Richfield Springs out of Elkton  [phone]   Add [facility] to my Favorites Quality of Patient Care Rating3  out of 5 stars Patient Survey Summary West Lawn out of Brooks  [phone]   Add Millbrook to my Favorites Quality of Patient Care Rating3 out of 5 stars Not Holy Cross  [phone]   Prairie Village to my Favorites Quality of Patient Care Rating4 out of 5 stars Patient Survey Summary Rating4 out of Ferris  [phone]   Spade to my Favorites Quality of Patient Care Rating3 out of 5 stars Patient Survey Summary Chicopee out of Minidoka  [phone]   Waverly to my Sky Lake  out of 5 stars Patient Survey Summary Rating4 out of Red River  [phone]   Boaz to my Favorites Quality of Patient Care Rating4  out of 5 stars Patient Survey Summary Des Moines out of 5 stars  Ridge[place] Corners  [phone]   Ector to my Favorites Quality of Patient Care Rating4  out of 5 stars Patient Survey  Summary Rating4 out of Bear Creek Village  [phone]   Rotan to my Favorites Quality of Patient Care Rating4 out of 5 stars Patient Survey Summary Rating4 out of 5 stars  ENCOMPASS Bogue  [phone]   L[place] P[place] to my Favorites Quality of Patient Care Rating3  out of 5 stars Patient Survey Summary Rating3 out of 5 stars  Stony Brook  [phone]   Worcester to my Favorites Quality of Patient Care Rating3 out of 5 stars Patient Survey Summary Rating4 out of Catron  [phone]   Ainsworth to my Favorites Quality of Patient Care Rating3 out of 5 stars Patient Survey Summary Rating3 out of 5 stars  [facility]  [phone]) [phone]   Add [facility] to my Favorites Quality of Patient Care Rating4 out of 5 stars Not Available11  [facility]  (336)  [phone]   Add [facility] to my Favorites Quality of Patient Care Rating3 out of 5 stars Patient Survey Summary Rating2 out of Perrinton  [phone]   Lapel to my New Carlisle  out of 5 stars Not Mountain Pine  [phone]   Atlanta to my Favorites Quality of Patient Care Rating3  out of 5 stars Patient Survey Summary Rating4 out of New Harmony  [phone]   Wapakoneta to my Favorites Quality of Patient Care Rating4 out of 5 stars Patient Survey Summary Rating4 out of St. [place]  [phone]   Copake Falls to my Favorites Quality of Patient Care Rating3  out of 5 stars Patient Survey Summary Marks out of North East  [phone]   Add Oxly to my Favorites Quality of Patient Care  Rating3 out of 5 stars Not Cottondale  [phone]   Etowah to my Favorites Quality of Patient Care Rating4 out of 5 stars Patient Survey Summary Rating4 out of Jayuya  [phone]   Golconda to my Favorites Quality of Patient Care Rating3 out of 5 stars Patient Survey Summary Rating4 out of Van Vleck  [phone]   Arlington to my Livonia Center  out of 5 stars Patient Survey Summary Rating4 out of Neola  [phone]   Bayview to my Favorites Quality of Patient Care Rating4  out of 5 stars Patient Survey Summary Rating3 out of Green Cove Springs  [phone]   Gilmore to my Favorites Quality of Patient Care Rating4  out of 5 stars Patient Survey Summary Rating4 out of Whitestown  [phone]   Cedar Crest to my Favorites Quality of Patient Care Rating4 out of 5 stars Patient Survey Summary Rating4 out of 5 stars  ENCOMPASS Gilbert  [phone]   Add E[facility]PASS Scotland to my Favorites Quality of Patient Care Rating3  out of 5 stars Patient Survey Summary Rating3 out of Alcester  [phone]   Pasquotank to my Favorites Quality of Patient Care Rating3 out of 5 stars Patient Survey Summary Rating4 out of Springhill  [phone]   Websterville to my Favorites Quality of Patient Care Rating3 out of 5 stars Patient Survey Summary Rating3 out of 5 stars  [facility]  [phone]) [phone]   Add [facility] to my Favorites Quality of Patient Care Rating4 out of 5 stars Not Available11  [facility]   [phone]   Add [facility] to my Favorites Quality of Patient Care Rating3 out of 5 stars Patient Survey Summary Rating2 out of Pastura  [phone]   Numa to my Favorites Quality of Patient Care Rating4  out  of 5 stars Not Ghent  [phone]   Shippenville to my Favorites Quality of Patient Care Rating3  out of 5 stars Patient Survey Summary Rating4 out of Sibley  [phone]   Buckeye to my Favorites Quality of Patient Care Rating4 out of 5 stars Patient Survey Summary Rating4 out of [place]  [phone]   Rock Island to my [place] of Patient Care Rating3  out of 5 stars Patient Survey Summary Hickory out of Perryville  [phone]   Add [facility] to my Favorites Quality of Patient Care Rating3 out of 5 stars Not McIntire  [phone]   Hazelton to my Favorites Quality of Patient Care Rating4 out of 5 stars Patient Survey Summary Rating4 out of Cherry Grove  [phone]   Pastoria to my Favorites Quality of Patient Care Rating3 out of 5 stars Patient Survey Summary Rating4 out of Lincoln Park  [phone]   Good Hope to my Milford  out of 5 stars Patient Survey Summary Rating4 out of Three Rivers  [phone]   Graford to my Favorites Quality of Patient Care Rating4  out of 5 stars Patient Survey Summary Burbank out of Underwood  [phone]   Ayrshire to my Favorites Quality of Patient Care Rating4  out of 5 stars Patient Survey Summary Rating4 out of  Stockbridge  [phone]   Bartolo to my Favorites Quality of Patient Care Rating4 out of 5 stars Patient Survey Summary Rating4 out of 5 stars  ENCOMPASS [place]. [place]  [phone]   Weston to my Favorites Quality of Patient Care Rating3  out of 5 stars Patient Survey Summary Rating3 out of Tupelo  [phone]   Fayette to my Favorites Quality of Patient Care Rating3 out of 5 stars Patient Survey Summary Rating4 out of Frisco  [phone]   Lexington to my Favorites Quality of Patient Care Rating3 out of 5 stars Patient Survey Summary Rating3 out of 5 stars  [facility]  [phone]) [phone]   Add [facility] to my Favorites Quality of Patient Care Rating4 out of 5 stars Not Torrance  ([phone]   Add [facility] to my Favorites Quality of Patient Care Rating3 out of 5 stars Patient Survey Summary Rating2 out of Hartsdale  [phone]   Bayou C[place] to my Ramona  out of 5 stars Not Ovid  [phone]   Betterton to my Favorites Quality of Patient Care Rating3  out of 5 stars Patient Survey Summary Rating4 out of Salem  [phone]   Menlo to  my Favorites Quality of Patient Care Rating4 out of 5 stars Patient Survey Summary Rating4 out of Willowbrook  [phone]   Munford to my Favorites Quality of Patient Care Rating3  out of 5 stars Patient Survey Summary Hapeville out of La Crosse  [phone]   Wendell to my Favorites Quality of Patient Care Rating3 out of 5 stars  Not Bosworth

## 2018-11-27 NOTE — Progress Notes (Signed)
Per primary RN, pt's family is unable to pick her up or be with her tonight, therefore pt cannot leave today. Call placed to tele to return pt to cardiac monitoring.

## 2018-11-27 NOTE — Progress Notes (Signed)
Call placed to CCMD to notify of telemetry monitoring d/c.   

## 2018-11-27 NOTE — Progress Notes (Signed)
Sister of patient Kimberly [name] called (x3) requesting information on patient. Told sister with first phone call that MD had not rounded.  Sister called back and information was relayed to sister from MD progress note dated today.   Patient remarked to her sister that no MD had been in today and that  she had not talked with the MD. Sister continues to inquire if patient has infection. Relayed information that situation appeared to be gastrointestinal related. Patient has not had anymore loose stools or emesis this shift.  Tolerating advance on diet to full liquids.   Patient states that she is ready to go home.

## 2018-11-27 NOTE — Progress Notes (Signed)
Sister of patient states she cannot come to pick up patient today. Sister states she is the only caregiver for patient and she is working two jobs and cannot be here before tmrw. MD Eliseo Squires made aware.

## 2018-11-28 DIAGNOSIS — I9589 Other hypotension: Secondary | ICD-10-CM

## 2018-11-28 DIAGNOSIS — E861 Hypovolemia: Secondary | ICD-10-CM

## 2018-11-28 LAB — CBC WITH DIFFERENTIAL/PLATELET
Abs Immature Granulocytes: 0.02 10*3/uL (ref 0.00–0.07)
Basophils Absolute: 0 10*3/uL (ref 0.0–0.1)
Basophils Relative: 0 %
Eosinophils Absolute: 0.1 10*3/uL (ref 0.0–0.5)
Eosinophils Relative: 2 %
HCT: 33.3 % — ABNORMAL LOW (ref 36.0–46.0)
Hemoglobin: 10.1 g/dL — ABNORMAL LOW (ref 12.0–15.0)
Immature Granulocytes: 0 %
Lymphocytes Relative: 36 %
Lymphs Abs: 2.3 10*3/uL (ref 0.7–4.0)
MCH: 21.5 pg — ABNORMAL LOW (ref 26.0–34.0)
MCHC: 30.3 g/dL (ref 30.0–36.0)
MCV: 71 fL — ABNORMAL LOW (ref 80.0–100.0)
Monocytes Absolute: 0.7 10*3/uL (ref 0.1–1.0)
Monocytes Relative: 10 %
Neutro Abs: 3.3 10*3/uL (ref 1.7–7.7)
Neutrophils Relative %: 52 %
Platelets: 171 10*3/uL (ref 150–400)
RBC: 4.69 MIL/uL (ref 3.87–5.11)
RDW: 16.5 % — ABNORMAL HIGH (ref 11.5–15.5)
WBC: 6.4 10*3/uL (ref 4.0–10.5)
nRBC: 0 % (ref 0.0–0.2)

## 2018-11-28 LAB — COMPREHENSIVE METABOLIC PANEL
ALT: 20 U/L (ref 0–44)
AST: 19 U/L (ref 15–41)
Albumin: 3.3 g/dL — ABNORMAL LOW (ref 3.5–5.0)
Alkaline Phosphatase: 81 U/L (ref 38–126)
Anion gap: 12 (ref 5–15)
BUN: 12 mg/dL (ref 8–23)
CO2: 20 mmol/L — ABNORMAL LOW (ref 22–32)
Calcium: 9.1 mg/dL (ref 8.9–10.3)
Chloride: 106 mmol/L (ref 98–111)
Creatinine, Ser: 1 mg/dL (ref 0.44–1.00)
GFR calc Af Amer: >60 mL/min (ref >60)
GFR calc non Af Amer: >60 mL/min (ref >60)
Glucose, Bld: 69 mg/dL — ABNORMAL LOW (ref 70–99)
Potassium: 3.6 mmol/L (ref 3.5–5.1)
Sodium: 138 mmol/L (ref 135–145)
Total Bilirubin: 0.6 mg/dL (ref 0.3–1.2)
Total Protein: 5.9 g/dL — ABNORMAL LOW (ref 6.5–8.1)

## 2018-11-28 NOTE — Discharge Summary (Addendum)
Physician Discharge Summary  Kimberly [name] ONG:[id number] DOB: Aug 30, 1955 DOA: 11/25/2018  PCP: [name], FNP  Admit date: 11/25/2018 Discharge date: 11/28/2018  Time spent: 45 minutes  Recommendations for Outpatient Follow-up:  1. Follow up with PCP 1-2 weeks for evaluation of symptoms. Recommend monitoring of BP as anti-hypertensive meds stopped due to low BP 2. Eat small frequent meals 3. Take medications as prescribed   Discharge Diagnoses:  Principal Problem:   Diarrhea Active Problems:   Dyslipidemia   Essential hypertension   Atrial fibrillation with RVR (HCC)   Coronary artery disease involving native coronary artery of native heart without angina pectoris   Hemiparesis affecting nondominant 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 ([place])   Dehydration, mild   AKI (acute kidney injury) ([place])   Discharge Condition: stable  Diet recommendation: heart healthy  Filed Weights   11/26/18 0513 11/27/18 0523 11/28/18 0300  Weight: 55 kg 55.2 kg 50.6 kg    History of present illness:  Kimberly [name] is a 63 y.o. female with medical history significant of CVA; HTN; HLD; afib on Eliquis; and CAD s/p CABG x 4 presented 4/13 with diarrhea.  She reported several days of non-bloody stools happening 3-4 times daily; cough; subjective fever; and generalized body aches.  She denied sick contacts but does have a caregiver. Admitted with presumed viral gastroenteritis  Hospital Course:  Diarrhea with mild dehydration -Presumed viral gastroenteritis -She remained afebrile and non-toxic appearing.  -unable to get stool sample- diarrhea resolved -tolerating diet at discharge  Afib with RVR  -Patient with afib with EMS but this converted to NSR prior to arrival -She does not appear to be on a rate-controlling medication at home HR range 60-70 during hospitalization -Continue Eliquis  CAD s/p CABG no chest pain. No events on tele.  -Continue ASA  S/p  CVA -Residual weakness -Has an in-home caregiver -PT eval- home health  HTN -Initially BP soft and home medications held. Remained  low end of normal -continue to hold BP medications at discharge -recommend close OP follow up with monitoring of BP  AKI . Likely related to decreased po intake in setting of persistent diarrhea. Resolved at discharge   HLD -Continue Lipitor  Hemiparesis affecting nondominant 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 . Chart review indicates Left upper and lower extremity hemiparesis from previous stroke. Reported frequent fall. Has caregiver M-F. Will discharge with Home health  Hypokalemia: in setting diuretics -repleted and resolved    Procedures:    Consultations:    Discharge Exam: Vitals:   11/28/18 0300 11/28/18 1132  BP: 104/79 124/78  Pulse: 62 68  Resp: 17 20  Temp: 97.8 F (36.6 C) 98 F (36.7 C)  SpO2: 100% 100%    General: Sitting up in bed watching tv and eating breakfast stating "I am ready to get out of here" Cardiovascular: rrr no mgr no LE edema Respiratory: normal effort BS clear bilaterally no wheeze  Discharge Instructions   Discharge Instructions    Diet - low sodium heart healthy   Complete by:  As directed    Discharge instructions   Complete by:  As directed    Bland diet until diarrhea resolves Your BP medications have been held, please see PCP in 1 week to have BP checked   Discharge instructions   Complete by:  As directed    Take medications as prescribed Follow up with PCP 1-2 weeks for evaluation of symptoms   Increase activity slowly   Complete by:  As directed    Increase activity slowly   Complete by:  As directed      Allergies as of 11/28/2018      Reactions   Clonidine Derivatives Other (See Comments)   Patch causes scars   Spironolactone Rash      Medication List    STOP taking these medications   amLODipine 10 MG tablet Commonly known as:  NORVASC    chlorthalidone 25 MG tablet Commonly known as:  HYGROTON   feeding supplement (ENSURE COMPLETE) Liqd   lisinopril 40 MG tablet Commonly known as:  PRINIVIL,ZESTRIL     TAKE these medications   acetaminophen 500 MG tablet Commonly known as:  TYLENOL Take 1,000 mg by mouth every 6 (six) hours as needed for moderate pain.   apixaban 5 MG Tabs tablet Commonly known as:  ELIQUIS Take 1 tablet (5 mg total) by mouth 2 (two) times daily.   aspirin EC 81 MG tablet Take 81 mg by mouth daily.   atorvastatin 80 MG tablet Commonly known as:  LIPITOR TAKE 1 TABLET BY MOUTH DAILY   B-D ASSURE BPM/AUTO ARM CUFF Misc 1 Device by Does not apply route once.   diclofenac sodium 1 % Gel Commonly known as:  VOLTAREN Apply 2 g topically 4 (four) times daily. What changed:    when to take this  reasons to take this   Mitigare 0.6 MG Caps Generic drug:  Colchicine Take 0.6 mg by mouth 2 (two) times daily as needed (gout attacks).   multivitamin with minerals tablet Take 1 tablet by mouth daily.   nicotine 21 mg/24hr patch Commonly known as:  NICODERM CQ - dosed in mg/24 hours Place 21 mg onto the skin daily.   omeprazole 20 MG capsule Commonly known as:  PRILOSEC TAKE 1 CAPSULE BY MOUTH EVERY DAY What changed:  how much to take      Allergies  Allergen Reactions  . Clonidine Derivatives Other (See Comments)    Patch causes scars  . Spironolactone Rash   Follow-up Information    [facility].   Specialty:  [place] Why:  They will do your home health care at your home Contact information: [place] [address] [phone]        [name], [place] In 1 week.   Specialty:  General Practice Why:  or with your new PCP Contact information: [address] [place] [address] [phone]            The results of significant diagnostics from this hospitalization (including imaging, microbiology,  ancillary and laboratory) are listed below for reference.    Significant Diagnostic Studies: Dg Chest Port 1 View  Result Date: 11/25/2018 CLINICAL DATA:  Cough and diarrhea over the last week. EXAM: PORTABLE CHEST 1 VIEW COMPARISON:  03/24/2018 FINDINGS: Previous median sternotomy and CABG. Pulmonary vascularity is normal. There is atherosclerosis of the aorta. The lungs are clear. No effusions. No acute bone finding. IMPRESSION: No active disease.  Previous CABG.  Aortic atherosclerosis. Electronically Signed   By: Nelson Chimes M.D.   On: 11/25/2018 13:34   Dg Abd Portable 1v  Result Date: 11/26/2018 CLINICAL DATA:  Intractable nausea and vomiting EXAM: PORTABLE ABDOMEN - 1 VIEW COMPARISON:  02/13/2018 FINDINGS: Scattered large and small bowel gas is noted. No obstructive changes are seen. No free air is noted. No acute bony abnormality is noted. IMPRESSION: No acute abnormality seen. Electronically Signed  By: Inez Catalina M.D.   On: 11/26/2018 19:33    Microbiology: Recent Results (from the past 240 hour(s))  SARS Coronavirus 2 [facility] order, Performed in [facility] hospital lab)     Status: None   Collection Time: 11/25/18  4:41 PM  Result Value Ref Range Status   SARS Coronavirus 2 NEGATIVE NEGATIVE Final    Comment: (NOTE) If result is NEGATIVE SARS-CoV-2 target nucleic acids are NOT DETECTED. The SARS-CoV-2 RNA is generally detectable in upper and lower  respiratory specimens during the acute phase of infection. The lowest  concentration of SARS-CoV-2 viral copies this assay can detect is 250  copies / mL. A negative result does not preclude SARS-CoV-2 infection  and should not be used as the sole basis for treatment or other  patient management decisions.  A negative result may occur with  improper specimen collection / handling, submission of specimen other  than nasopharyngeal swab, presence of viral mutation(s) within the  areas targeted by this assay, and inadequate  number of viral copies  (<250 copies / mL). A negative result must be combined with clinical  observations, patient history, and epidemiological information. If result is POSITIVE SARS-CoV-2 target nucleic acids are DETECTED. The SARS-CoV-2 RNA is generally detectable in upper and lower  respiratory specimens dur ing the acute phase of infection.  Positive  results are indicative of active infection with SARS-CoV-2.  Clinical  correlation with patient history and other diagnostic information is  necessary to determine patient infection status.  Positive results do  not rule out bacterial infection or co-infection with other viruses. If result is PRESUMPTIVE POSTIVE SARS-CoV-2 nucleic acids MAY BE PRESENT.   A presumptive positive result was obtained on the submitted specimen  and confirmed on repeat testing.  While 2019 novel coronavirus  (SARS-CoV-2) nucleic acids may be present in the submitted sample  additional confirmatory testing may be necessary for epidemiological  and / or clinical management purposes  to differentiate between  SARS-CoV-2 and other Sarbecovirus currently known to infect humans.  If clinically indicated additional testing with an alternate test  methodology [phone]) is advised. The SARS-CoV-2 RNA is generally  detectable in upper and lower respiratory sp ecimens during the acute  phase of infection. The expected result is Negative. Fact Sheet for Patients:  StrictlyIdeas.no Fact Sheet for Healthcare Providers: BankingDealers.co.za This test is not yet approved or cleared by the [place] FDA and has been authorized for detection and/or diagnosis of SARS-CoV-2 by FDA under an Emergency Use Authorization (EUA).  This EUA will remain in effect (meaning this test can be used) for the duration of the COVID-19 declaration under Section 564(b)(1) of the Act, 21 U.S.C. section 360bbb-3(b)(1), unless the  authorization is terminated or revoked sooner. Performed at [facility], [place] [address] [place], [place] [address]      Labs: Basic Metabolic Panel: Recent Labs  Lab 11/25/18 1300 11/26/18 0901 11/27/18 0620 11/28/18 0521  NA 139 140 139 138  K 4.2 3.8 3.3* 3.6  CL 108 114* 109 106  CO2 19* 21* 21* 20*  GLUCOSE 94 87 86 69*  BUN 28* 20 11 12   CREATININE 1.53* 1.06* 1.02* 1.00  CALCIUM 9.8 8.9 9.0 9.1   Liver Function Tests: Recent Labs  Lab 11/25/18 1300 11/26/18 0901 11/27/18 0620 11/28/18 0521  AST 26 23 20 19   ALT 26 22 20 20   ALKPHOS 99 78 73 81  BILITOT 0.6 0.5 0.5 0.6  PROT 6.8 5.9*  5.6* 5.9*  ALBUMIN 4.1 3.3* 3.1* 3.3*   Recent Labs  Lab 11/25/18 1300  LIPASE 23   No results for input(s): AMMONIA in the last 168 hours. CBC: Recent Labs  Lab 11/25/18 1300 11/26/18 0901 11/27/18 0620 11/28/18 0521  WBC 7.5 6.6 6.0 6.4  NEUTROABS 4.8 4.1 3.4 3.3  HGB 11.7* 10.1* 9.4* 10.1*  HCT 40.2 33.5* 31.5* 33.3*  MCV 74.2* 72.5* 71.6* 71.0*  PLT 209 212 167 171   Cardiac Enzymes: Recent Labs  Lab 11/25/18 1300  TROPONINI <0.03   BNP: BNP (last 3 results) No results for input(s): BNP in the last 8760 hours.  ProBNP (last 3 results) No results for input(s): PROBNP in the last 8760 hours.  CBG: No results for input(s): GLUCAP in the last 168 hours.     Signed:  Dyanne Carrel NP [facility] 11/28/2018, 12:30 PM  Patient was  discussed with the Advance Practice Provider.  I have personally reviewed the clinical findings, labs, EKG, imaging studies and management of this patient in detail. I have also reviewed the orders written for this patient which were under my direction. I agree with the documentation, as recorded by the Advance Practice Provider.   Kimberly [name] is a 63 y.o. female was here with a gastroenteritis that has slowly improved.  BP still on the lower end and she will need close monitoring by her PCP to resume if  needed.  Geradine Girt, DO  How to contact the [facility] Attending or Consulting provider [place] or covering provider during after hours [place], for this patient?  1. Check the care team in [facility] and look for a) attending/consulting TRH provider listed and b) the [facility] team listed 2. Log into [email or web address] and use [place]'s universal password to access. If you do not have the password, please contact the hospital operator. 3. Locate the [facility] provider you are looking for under [facility] and page to a number that you can be directly reached. 4. If you still have difficulty reaching the provider, please page the [facility] (Director on Call) for the Hospitalists listed on amion for assistance.

## 2018-11-28 NOTE — Progress Notes (Addendum)
Physical Therapy Treatment Patient Details Name: Kimberly [name] MRN: [id number] DOB: 1956-05-22 Today's Date: 11/28/2018    History of Present Illness 63 yo admitted with diarrhea and Afib, covid (-) PMhx: CVA, HTN, HLD, Afib, CAD, CABG, gout    PT Comments    PT pleasant and moving with increased ease and tolerance this session. Pt able to walk slightly further, stand at sink and agreeable to OOB to chair this session. Pt worried about being set up with SCAT and meals on wheels with case management contacted and eager to return home with assist of sister today. Will continue to follow and recommend daily mobility with nursing assist.  BP 118/64    Follow Up Recommendations  Home health PT;Supervision for mobility/OOB     Equipment Recommendations  None recommended by PT    Recommendations for Other Services       Precautions / Restrictions Precautions Precautions: Fall Precaution Comments: left hemiparesis    Mobility  Bed Mobility Overal bed mobility: Modified Independent             General bed mobility comments: HOB elevated 20 degrees, exiting to left  Transfers Overall transfer level: Needs assistance   Transfers: Sit to/from Stand Sit to Stand: Min guard Stand pivot transfers: Min guard       General transfer comment: slightly elevated bed height to stand with cues for sequence of pivot as pt performs 180 degree turn vs 90 despite education but performs safely with use of bil UE  Ambulation/Gait Ambulation/Gait assistance: Min guard Gait Distance (Feet): 15 Feet Assistive device: Quad cane Gait Pattern/deviations: Step-to pattern;Decreased dorsiflexion - left   Gait velocity interpretation: <1.8 ft/sec, indicate of risk for recurrent falls General Gait Details: pt walked with slow hemiparetic gait around bed to chair and declined further gait. Pt continues to reports she only walks when assist is present and otherwise uses w/C   Stairs             Wheelchair Mobility    Modified Rankin (Stroke Patients Only)       Balance Overall balance assessment: History of Falls;Needs assistance   Sitting balance-Leahy Scale: Good Sitting balance - Comments: pt able to sit at toilet and bed without UE support and perform scooting and pericare   Standing balance support: Single extremity supported Standing balance-Leahy Scale: Poor Standing balance comment: pt requires UE support to maintain standing. Pt stood at sink to wash hands with UE support at all times                            Cognition Arousal/Alertness: Awake/alert Behavior During Therapy: WFL for tasks assessed/performed Overall Cognitive Status: No family/caregiver present to determine baseline cognitive functioning Area of Impairment: Memory                     Memory: Decreased short-term memory         General Comments: pt with decreased STM and problem solving. PT does not demonstrate carrry over of education within session. Pt much more pleasant and calm this session      Exercises      General Comments        Pertinent Vitals/Pain Pain Assessment: 0-10 Pain Score: 2  Pain Location: back pain Pain Descriptors / Indicators: Aching;Constant Pain Intervention(s): Limited activity within patient's tolerance;Monitored during session;Repositioned    Home Living  Prior Function            PT Goals (current goals can now be found in the care plan section) Progress towards PT goals: Progressing toward goals    Frequency           PT Plan Current plan remains appropriate    Co-evaluation              AM-PAC PT "6 Clicks" Mobility   Outcome Measure  Help needed turning from your back to your side while in a flat bed without using bedrails?: A Little Help needed moving from lying on your back to sitting on the side of a flat bed without using bedrails?: A Little Help needed moving  to and from a bed to a chair (including a wheelchair)?: A Little Help needed standing up from a chair using your arms (e.g., wheelchair or bedside chair)?: A Little Help needed to walk in hospital room?: A Little Help needed climbing 3-5 steps with a railing? : A Lot 6 Click Score: 17    End of Session   Activity Tolerance: Patient tolerated treatment well Patient left: in chair;with call bell/phone within reach;with chair alarm set Nurse Communication: Mobility status PT Visit Diagnosis: Other abnormalities of gait and mobility (R26.89);History of falling (Z91.81);Muscle weakness (generalized) (M62.81);Unsteadiness on feet (R26.81);Other symptoms and signs involving the nervous system (R29.898)     Time: 5009-3818 PT Time Calculation (min) (ACUTE ONLY): 27 min  Charges:  $Gait Training: 8-22 mins $Therapeutic Activity: 8-22 mins                     [place], PT Acute Rehabilitation Services Pager: [phone] Office: [place] 11/28/2018, 12:57 PM

## 2018-11-28 NOTE — Progress Notes (Signed)
PIV removed, AVS given and discharge instructions reviewed with patient. Patient discharged via wheelchair.

## 2018-11-28 NOTE — TOC Transition Note (Signed)
Transition of Care [facility]) - CM/SW Discharge Note   Patient Details  Name: Kimberly [name] MRN: [id number] Date of Birth: [birth year]  [facility] CM/SW Contact:  Sherrilyn Rist Phone Number: [phone] 11/28/2018, 1:14 PM   Clinical Narrative:    [place] arranged with [facility]; SCAT application given to patient for her/ family to complete and mail or fax out.    Final next level of care: [place] Barriers to Discharge: No Barriers Identified   Patient Goals and CMS Choice Patient states their goals for this hospitalization and ongoing recovery are:: to get out of here CMS Medicare.gov Compare Post Acute Care list provided to:: Patient Choice offered to / list presented to : Patient  Discharge Placement                       Discharge Plan and Services In-house Referral: NA Discharge Planning Services: CM Consult Post Acute Care Choice: Home Health          DME Arranged: N/A DME Agency: NA HH Arranged: RN, PT [place] Agency: [facility] (formerly [employer])   Social Determinants of Health (SDOH) Interventions     Readmission Risk Interventions No flowsheet data found.

## 2018-11-29 ENCOUNTER — Telehealth: Payer: Self-pay | Admitting: Cardiology

## 2018-11-29 NOTE — Telephone Encounter (Signed)
Called patient, she would like to know if Dr.Hochrein gave her the flu shot. I advised patient I did not see it mentioned in the note from her last visit. Advised patient to contact PCP office to see if they were still giving them, I was unsure at this time if we still had some in office to give.  Patient verbalized understanding.

## 2018-11-29 NOTE — Telephone Encounter (Signed)
New Message              Patient is needing a call back with information about her flu shot, pls call and advise.

## 2018-12-01 DIAGNOSIS — M2040 Other hammer toe(s) (acquired), unspecified foot: Secondary | ICD-10-CM

## 2018-12-01 DIAGNOSIS — M1A9XX0 Chronic gout, unspecified, without tophus (tophi): Secondary | ICD-10-CM

## 2018-12-01 DIAGNOSIS — H53462 Homonymous bilateral field defects, left side: Secondary | ICD-10-CM

## 2018-12-01 DIAGNOSIS — I712 Thoracic aortic aneurysm, without rupture: Secondary | ICD-10-CM

## 2018-12-01 DIAGNOSIS — H547 Unspecified visual loss: Secondary | ICD-10-CM

## 2018-12-01 DIAGNOSIS — I2510 Atherosclerotic heart disease of native coronary artery without angina pectoris: Secondary | ICD-10-CM

## 2018-12-01 DIAGNOSIS — M47812 Spondylosis without myelopathy or radiculopathy, cervical region: Secondary | ICD-10-CM

## 2018-12-01 DIAGNOSIS — Z9181 History of falling: Secondary | ICD-10-CM

## 2018-12-01 DIAGNOSIS — Z87891 Personal history of nicotine dependence: Secondary | ICD-10-CM

## 2018-12-01 DIAGNOSIS — Z951 Presence of aortocoronary bypass graft: Secondary | ICD-10-CM

## 2018-12-01 DIAGNOSIS — I422 Other hypertrophic cardiomyopathy: Secondary | ICD-10-CM

## 2018-12-01 DIAGNOSIS — I10 Essential (primary) hypertension: Secondary | ICD-10-CM

## 2018-12-01 DIAGNOSIS — E785 Hyperlipidemia, unspecified: Secondary | ICD-10-CM

## 2018-12-01 DIAGNOSIS — Z7901 Long term (current) use of anticoagulants: Secondary | ICD-10-CM

## 2018-12-01 DIAGNOSIS — I700 Atherosclerosis of aorta: Secondary | ICD-10-CM

## 2018-12-01 DIAGNOSIS — I69354 Hemiplegia and hemiparesis following cerebral infarction affecting left non-dominant side: Secondary | ICD-10-CM | POA: Diagnosis not present

## 2018-12-01 DIAGNOSIS — I480 Paroxysmal atrial fibrillation: Secondary | ICD-10-CM

## 2018-12-02 ENCOUNTER — Telehealth: Payer: Self-pay | Admitting: Cardiology

## 2018-12-02 NOTE — Telephone Encounter (Signed)
New message  Patient's sister wants to know if it is okay for her caregiver to start patient back on heart medications? Please call to discuss.

## 2018-12-02 NOTE — Telephone Encounter (Signed)
  [name] is calling back to give BP reading which is 139/78

## 2018-12-02 NOTE — Telephone Encounter (Signed)
Pt was hospitalized from 4/13-4/16. Discharge instructions were for pt to hold BP meds d/t low BP and have BP closely monitored.  Patient's sister, [name], wanted MD to review current med list from discharge (listed below). She also states that pt's caregiver had not been giving pt the meds that she was instructed to take after discharge b/c pt, who has hx of CVA, told the caregiver that she was not to take any of her meds, but she is working on making sure pt is taking the correct meds. Attempted to review med list with pt sister. She states that she is not with pt at the moment and nurse advised her to review med list once able to do so. She stated that she is concerned that pt woke up this AM c/o lightheadedness, dizziness, and had some confusion. Informed her that confusion may be BP or CVA hx related and she states that pt does get confused often. Also informed that lightheaded and dizziness may be related to BP. Advised her to monitor pt symptoms and get BP reading and call back office today to report. Pt sister verbalized understanding.  Medication List    STOP taking these medications   amLODipine 10 MG tablet Commonly known as:  NORVASC   chlorthalidone 25 MG tablet Commonly known as:  HYGROTON   feeding supplement (ENSURE COMPLETE) Liqd   lisinopril 40 MG tablet Commonly known as:  PRINIVIL,ZESTRIL     TAKE these medications   acetaminophen 500 MG tablet Commonly known as:  TYLENOL Take 1,000 mg by mouth every 6 (six) hours as needed for moderate pain.   apixaban 5 MG Tabs tablet Commonly known as:  ELIQUIS Take 1 tablet (5 mg total) by mouth 2 (two) times daily.   aspirin EC 81 MG tablet Take 81 mg by mouth daily.   atorvastatin 80 MG tablet Commonly known as:  LIPITOR TAKE 1 TABLET BY MOUTH DAILY   B-D ASSURE BPM/AUTO ARM CUFF Misc 1 Device by Does not apply route once.   diclofenac sodium 1 % Gel Commonly known as:  VOLTAREN Apply 2 g topically 4  (four) times daily. What changed:    when to take this  reasons to take this   Mitigare 0.6 MG Caps Generic drug:  Colchicine Take 0.6 mg by mouth 2 (two) times daily as needed (gout attacks).   multivitamin with minerals tablet Take 1 tablet by mouth daily.   nicotine 21 mg/24hr patch Commonly known as:  NICODERM CQ - dosed in mg/24 hours Place 21 mg onto the skin daily.   omeprazole 20 MG capsule Commonly known as:  PRILOSEC TAKE 1 CAPSULE BY MOUTH EVERY DAY What changed:  how much to take

## 2018-12-03 ENCOUNTER — Telehealth: Payer: Self-pay | Admitting: Cardiology

## 2018-12-03 NOTE — Telephone Encounter (Signed)
New message   Patient states that during an office visit she spoke to Dr. Percival Spanish about changing PCP and wants to know the name of the providers that were recommended. Please call patient to discuss.

## 2018-12-04 NOTE — Telephone Encounter (Signed)
Looks OK. No change in therapy.

## 2018-12-05 ENCOUNTER — Ambulatory Visit (INDEPENDENT_AMBULATORY_CARE_PROVIDER_SITE_OTHER): Payer: Medicaid Other | Admitting: Nurse Practitioner

## 2018-12-05 ENCOUNTER — Encounter: Payer: Self-pay | Admitting: Nurse Practitioner

## 2018-12-05 ENCOUNTER — Other Ambulatory Visit: Payer: Self-pay

## 2018-12-05 VITALS — BP 116/78

## 2018-12-05 DIAGNOSIS — R197 Diarrhea, unspecified: Secondary | ICD-10-CM

## 2018-12-05 DIAGNOSIS — M545 Low back pain, unspecified: Secondary | ICD-10-CM

## 2018-12-05 DIAGNOSIS — G8929 Other chronic pain: Secondary | ICD-10-CM

## 2018-12-05 NOTE — Progress Notes (Signed)
Virtual Visit via Video    This visit type was conducted due to national recommendations for restrictions regarding the COVID-19 Pandemic (e.g. social distancing) in an effort to limit this patient's exposure and mitigate transmission in our community.  Patients identity confirmed using two different identifiers.  This format is felt to be most appropriate for this patient at this time.  All issues noted in this document were discussed and addressed.  No physical exam was performed (except for noted visual exam findings with Video Visits).    Date:  12/05/2018   ID:  Kimberly [name], Kimberly [name] 06/18/1956, MRN [id number]  Patient Location:  Home - spoke with Gunnar Fusi and [name] (sister)  Provider location:   Office   Chief Complaint:  Diarrhea and hospital follow up  History of Present Illness:    Kimberly [name] is a 63 y.o. female who presents via video conferencing for a telehealth visit today.    The patient does not have symptoms concerning for COVID-19 infection (fever, chills, cough, or new shortness of breath).   She had been caring for by her caregiver had a stomach bug when she went to the hospital her blood pressure was really low.  They were having to assist her with getting to bed.  Sunday morning her clothes were wet. She has been home since 11/28/2018.  She has PCS services.  They sent out PT only for one year, [place].    She has not had any diarrhea since last Thursday.      Past Medical History:  Diagnosis Date  . Bicipital tenosynovitis   . Coronary artery disease 05/2007   [id number]   . Fall 02/13/2018  . Family history of ischemic heart disease   . Gout   . Hammer toe   . Hip, thigh, leg, and ankle, insect bite, nonvenomous, without mention of infection(916.4)   . Hyperlipidemia   . Hypertension   . Other abnormality of red blood cells   . Rotator cuff syndrome of left shoulder   . Stroke [facility])    Past Surgical History:  Procedure  Laterality Date  . CARDIAC CATHETERIZATION    . CORONARY ARTERY BYPASS GRAFT     triple  . CORONARY ARTERY BYPASS GRAFT  2008  . TUBAL LIGATION       Current Meds  Medication Sig  . acetaminophen (TYLENOL) 500 MG tablet Take 1,000 mg by mouth every 6 (six) hours as needed for moderate pain.  [name] apixaban (ELIQUIS) 5 MG TABS tablet Take 1 tablet (5 mg total) by mouth 2 (two) times daily.  [name] aspirin EC 81 MG tablet Take 81 mg by mouth daily.  [name] atorvastatin (LIPITOR) 80 MG tablet TAKE 1 TABLET BY MOUTH DAILY (Patient taking differently: Take 80 mg by mouth daily. )  . MITIGARE 0.6 MG CAPS Take 0.6 mg by mouth 2 (two) times daily as needed (gout attacks).  [name] omeprazole (PRILOSEC) 20 MG capsule TAKE 1 CAPSULE BY MOUTH EVERY DAY (Patient taking differently: Take 20 mg by mouth daily. )     Allergies:   Clonidine derivatives and Spironolactone   Social History   Tobacco Use  . Smoking status: Light Tobacco Smoker    Types: Cigarettes  . Smokeless tobacco: Former Systems developer    Quit date: 08/09/2008  Substance Use Topics  . Alcohol use: Yes    Alcohol/week: 1.0 standard drinks    Types: 1 Cans of beer per week  . Drug use: Yes  Types: Marijuana    Comment: history of cocaine use     Family Hx: The patient's family history includes Anxiety disorder in her sister; Diabetes in her father; Heart attack in her father; Hypertension in her mother; Sarcoidosis in her sister.  ROS:   Please see the history of present illness.    ROS  All other systems reviewed and are negative.   Labs/Other Tests and Data Reviewed:    Recent Labs: 02/13/2018: TSH 1.245 02/17/2018: Magnesium 1.7 11/28/2018: ALT 20; BUN 12; Creatinine, Ser 1.00; Hemoglobin 10.1; Platelets 171; Potassium 3.6; Sodium 138   Recent Lipid Panel Lab Results  Component Value Date/Time   CHOL 173 07/24/2018 11:58 AM   TRIG 110 07/24/2018 11:58 AM   HDL 58 07/24/2018 11:58 AM   CHOLHDL 3.0 07/24/2018 11:58 AM   CHOLHDL 3.6  02/14/2018 03:14 AM   LDLCALC 93 07/24/2018 11:58 AM    Wt Readings from Last 3 Encounters:  11/28/18 111 lb 8 oz (50.6 kg)  10/10/18 127 lb (57.6 kg)  07/24/18 120 lb (54.4 kg)     Exam:    Vital Signs:  BP 116/78 (BP Location: Right Arm, Patient Position: Sitting, Cuff Size: Small)     Physical Exam Unable to visualize was a phone visit  ASSESSMENT & PLAN:    1. Diarrhea, unspecified type  This is improving since she has been home from the ER  Advised to make sure she is not taking a stool softner  She was also tested for COVID-19 which was negative  2. Chronic low back pain without sciatica, unspecified back pain laterality  She is having chronic low back pain.   Will refer for PT and SN for medication assistance and safethy  She has a home health aid that works with her daily - Ambulatory referral to [place] - Referral to [place]   COVID-19 Education: The signs and symptoms of COVID-19 were discussed with the patient and how to seek care for testing (follow up with PCP or arrange E-visit).  The importance of social distancing was discussed today.  Patient Risk:   After full review of this patients clinical status, I feel that they are at least moderate risk at this time.  Time:   Today, I have spent 12 minutes/ seconds with the patient with telehealth technology discussing above diagnoses.     Medication Adjustments/Labs and Tests Ordered: Current medicines are reviewed at length with the patient today.  Concerns regarding medicines are outlined above.   Tests Ordered: No orders of the defined types were placed in this encounter.   Medication Changes: No orders of the defined types were placed in this encounter.   Disposition:  Follow up in 3 month(s)  Signed, [name], FNP

## 2018-12-05 NOTE — Telephone Encounter (Signed)
Pt aware BP is good no changes

## 2018-12-06 ENCOUNTER — Ambulatory Visit: Payer: Self-pay | Admitting: *Deleted

## 2018-12-06 ENCOUNTER — Ambulatory Visit: Payer: Self-pay

## 2018-12-06 DIAGNOSIS — I639 Cerebral infarction, unspecified: Secondary | ICD-10-CM

## 2018-12-06 DIAGNOSIS — M545 Low back pain, unspecified: Secondary | ICD-10-CM

## 2018-12-06 DIAGNOSIS — G8929 Other chronic pain: Secondary | ICD-10-CM

## 2018-12-06 DIAGNOSIS — I10 Essential (primary) hypertension: Secondary | ICD-10-CM

## 2018-12-06 DIAGNOSIS — I480 Paroxysmal atrial fibrillation: Secondary | ICD-10-CM

## 2018-12-06 DIAGNOSIS — I4891 Unspecified atrial fibrillation: Secondary | ICD-10-CM

## 2018-12-06 NOTE — Chronic Care Management (AMB) (Signed)
  Chronic Care Management   Initial Visit Note  12/06/2018 Name: Kimberly [name] MRN: [id number] DOB: 1956/05/06  Referred by: [name], FNP Reason for referral : Chronic Care Management (Initial Outreach Poss COVID)   Kimberly [name] is a 63 y.o. year old female who is a primary care patient of [name], [place]. The CCM team was consulted for assistance with chronic disease management and care coordination needs.   Review of patient status, including review of consultants reports, relevant laboratory and other test results, and collaboration with appropriate care team members and the patient's provider was performed as part of comprehensive patient evaluation and provision of chronic care management services.    I initiated and established the plan of care for [facility] during one on one collaboration with my clinical care management colleague [name] BSW who is also engaged with this patient to address social work needs.   Goals Addressed            This Visit's Progress   . Chronic Care Management Needs (pt-stated)       Current Barriers:   Ineffective Self Health Maintenance  Knowledge Deficits related to short term plan for care coordination needs and long term plans for chronic disease management needs  Nurse Case Manager Clinical Goal(s):   Over the next 90 days, patient will work with care management team to address care coordination and chronic disease management needs related to Disease Management, Educational Needs, Care Coordination    Interventions:   One on one collaboration establish plan of care to address care coordination and disease management needs with clinical colleague [name] BSW.   Patient Self Care Activities:   Currently UNABLE TO independently self manage needs related to chronic health conditions.   Initial goal documentation        The CM team will reach out to the patient again over the next 14 days.   [place] Nurse Care Coordinator [facility] Care Management [phone]

## 2018-12-06 NOTE — Chronic Care Management (AMB) (Signed)
  Chronic Care Management    Clinical Social Work General Note  12/06/2018 Name: Ayrianna Mcginniss MRN: [id number] DOB: 07-Nov-1955  Areta Terwilliger is a 63 y.o. year old female who is a primary care patient of [name], [place]. The CCM was consulted to assist the patient with [employer], Landscape architect and [employer].   Ms. Ekblad was given information about Chronic Care Management services today including:  1. CCM service includes personalized support from designated clinical staff supervised by her physician, including individualized plan of care and coordination with other care providers 2. 24/7 contact phone numbers for assistance for urgent and routine care needs. 3. Service will only be billed when office clinical staff spend 20 minutes or more in a month to coordinate care. 4. Only one practitioner may furnish and bill the service in a calendar month. 5. The patient may stop CCM services at any time (effective at the end of the month) by phone call to the office staff. 6. The patient will be responsible for cost sharing (co-pay) of up to 20% of the service fee (after annual deductible is met).  Patient agreed to services and verbal consent obtained.   Review of patient status, including review of consultants reports, relevant laboratory and other test results, and collaboration with appropriate care team members and the patient's provider was performed as part of comprehensive patient evaluation and provision of chronic care management services.    SDOH (Social Determinants of Health) screening performed today. See Care Plan Entry related to challenges with:  Transportation. The patient identified other challenges related to food insecurity and financial strain but declined SW intervention at this time.  Goals Addressed            This Visit's Progress     Patient Stated   . "I need options for transportation" (pt-stated)       Current Barriers:  .  Financial constraints . ADL IADL limitations . Limited education about Medicaid transportation benefit . Lacks knowledge of community resource: SCAT  Clinical Social Work Clinical Goal(s):  [name] Over the next 30 days, client will work with SW to address concerns related to transportation  Interventions: . Patient interviewed and appropriate assessments performed . Provided patient with information about transportation resource . Discussed plans with patient for ongoing care management follow up and provided patient with direct contact information for care management team  . Collaboration with [place] to request a transportation assessment for the patient to utilize DSS transportation services who reports a caseworker will contact the patient today to initiate process . Educated the patient on the SCAT program - the patient reports she thinks a SW from [facility] is assisting with SCAT . Collaboration with [facility] who reports a SW is not assigned to the patients case . Planned follow up call to the patient within the next week to assist with completion of SCAT application  Patient Self Care Activities:  . Currently UNABLE TO independently manage ADl's and iADL's  Initial goal documentation       Follow Up Plan: SW will follow up with patient by phone over the next 5-7 days.       [name], BSW, CDP [facility] / [facility] Care Management Social Worker [phone]  Total time spent performing care coordination and/or care management activities with the patient by phone or face to face = 30 minutes.

## 2018-12-06 NOTE — Patient Instructions (Signed)
Social Worker Visit Information  Goals we discussed today:  Goals Addressed            This Visit's Progress     Patient Stated   . "I need options for transportation" (pt-stated)       Current Barriers:  . Financial constraints . ADL IADL limitations . Limited education about Medicaid transportation benefit . Lacks knowledge of community resource: SCAT  Clinical Social Work Clinical Goal(s):  [name] Over the next 30 days, client will work with SW to address concerns related to transportation  Interventions: . Patient interviewed and appropriate assessments performed . Provided patient with information about transportation resource . Discussed plans with patient for ongoing care management follow up and provided patient with direct contact information for care management team  . Collaboration with [place] to request a transportation assessment for the patient to utilize DSS transportation services who reports a caseworker will contact the patient today to initiate process . Educated the patient on the SCAT program - the patient reports she thinks a SW from [facility] is assisting with SCAT . Collaboration with [facility] who reports a SW is not assigned to the patients case . Planned follow up call to the patient within the next week to assist with completion of SCAT application  Patient Self Care Activities:  . Currently UNABLE TO independently manage ADl's and iADL's  Initial goal documentation         Materials provided: Verbal education about Medicaid transportation benefit provided by phone  Ms. Gagliano was given information about Chronic Care Management services today including:  1. CCM service includes personalized support from designated clinical staff supervised by her physician, including individualized plan of care and coordination with other care providers 2. 24/7 contact phone numbers for assistance for urgent and routine care needs. 3. Service  will only be billed when office clinical staff spend 20 minutes or more in a month to coordinate care. 4. Only one practitioner may furnish and bill the service in a calendar month. 5. The patient may stop CCM services at any time (effective at the end of the month) by phone call to the office staff. 6. The patient will be responsible for cost sharing (co-pay) of up to 20% of the service fee (after annual deductible is met).  Patient agreed to services and verbal consent obtained.   The patient verbalized understanding of instructions provided today and declined a print copy of patient instruction materials.   Follow up plan: SW will follow up with patient by phone over the next 5-7 days.   [name], BSW, CDP [facility] / [facility] Care Management Social Worker [phone]

## 2018-12-10 ENCOUNTER — Ambulatory Visit: Payer: Self-pay

## 2018-12-10 DIAGNOSIS — I4891 Unspecified atrial fibrillation: Secondary | ICD-10-CM

## 2018-12-10 DIAGNOSIS — Z741 Need for assistance with personal care: Secondary | ICD-10-CM

## 2018-12-10 DIAGNOSIS — I10 Essential (primary) hypertension: Secondary | ICD-10-CM

## 2018-12-10 DIAGNOSIS — H547 Unspecified visual loss: Secondary | ICD-10-CM

## 2018-12-10 NOTE — Chronic Care Management (AMB) (Signed)
  Chronic Care Management    Clinical Social Work Follow Up Note  12/10/2018 Name: Kimberly [name] MRN: [id number] DOB: 23-Nov-1955  Kimberly [name] is a 63 y.o. year old female who is a primary care patient of Kimberly [name], [place]. The CCM team was consulted for assistance with Transportation Resources.   Review of patient status, including review of consultants reports, other relevant assessments, and collaboration with appropriate care team members and the patient's provider was performed as part of comprehensive patient evaluation and provision of chronic care management services.     Goals Addressed            This Visit's Progress     Patient Stated   . "I need options for transportation" (pt-stated)   Not on track    Current Barriers:  . Financial constraints . ADL IADL limitations . Limited education about Medicaid transportation benefit . Lacks knowledge of community resource: SCAT  Clinical Social Work Clinical Goal(s):  [name] Over the next 30 days, client will work with SW to address concerns related to transportation  Interventions: . Telephonic follow up by CCM SW . Telephonically assisted the patient in the completion of SCAT application . Educated the patient on the importance of being able to exit her home independently either via wheel chair ramp and/or handrail. The patient reports she lives in an apartment complex and needs hands on assistance to exit . Encouraged the patient to contact her property manager regarding installation of needed equipment in order to exit the home and safely utilize SCAT services . Explained to the patient that if the patient can not safely exit the SCAT program will not provide services . Advised the patient she should have received a call from her DSS worker to complete a transportation assessment - determined the patient has yet to receive a call . Attempted to provide the patient with the contact number to DSS - the patient declined .  Encouraged the patient to outreach DSS within the next 2 business days to initiate transportation assessment . Collaborated with the patients primary physician regarding completion of Part B of SCAT application . Advised the patient of the SCAT application process  Patient Self Care Activities:  . Currently UNABLE TO independently manage ADl's and iADL's  Please see past updates related to this goal by clicking on the "Past Updates" button in the selected goal          Follow Up Plan: SW will follow up with patient by phone over the next two weeks   Kimberly [name], BSW, CDP [facility] / [facility] Care Management Social Worker [phone]  Total time spent performing care coordination and/or care management activities with the patient by phone or face to face = 20 minutes.

## 2018-12-10 NOTE — Patient Instructions (Signed)
Social Worker Visit Information  Goals we discussed today:  Goals Addressed            This Visit's Progress     Patient Stated   . "I need options for transportation" (pt-stated)   Not on track    Current Barriers:  . Financial constraints . ADL IADL limitations . Limited education about Medicaid transportation benefit . Lacks knowledge of community resource: SCAT  Clinical Social Work Clinical Goal(s):  [name] Over the next 30 days, client will work with SW to address concerns related to transportation  Interventions: . Telephonic follow up by CCM SW . Telephonically assisted the patient in the completion of SCAT application . Educated the patient on the importance of being able to exit her home independently either via wheel chair ramp and/or handrail. The patient reports she lives in an apartment complex and needs hands on assistance to exit . Encouraged the patient to contact her property manager regarding installation of needed equipment in order to exit the home and safely utilize SCAT services . Explained to the patient that if the patient can not safely exit the SCAT program will not provide services . Advised the patient she should have received a call from her DSS worker to complete a transportation assessment - determined the patient has yet to receive a call . Attempted to provide the patient with the contact number to DSS - the patient declined . Encouraged the patient to outreach DSS within the next 2 business days to initiate transportation assessment . Collaborated with the patients primary physician regarding completion of Part B of SCAT application . Advised the patient of the SCAT application process  Patient Self Care Activities:  . Currently UNABLE TO independently manage ADl's and iADL's  Please see past updates related to this goal by clicking on the "Past Updates" button in the selected goal          Materials Provided: No: Patient declined  Follow Up  Plan: SW will follow up with patient by phone over the next two weeks   [name], BSW, CDP [facility] / [place] Management Social Worker [phone]

## 2018-12-12 ENCOUNTER — Telehealth: Payer: Self-pay

## 2018-12-12 NOTE — Telephone Encounter (Signed)
Patient's sister called stating the patient's blood pressure is high and she is having dizziness she stated she then nurse from [facility] told them to give [place] a call.  I ADVISED PT THAT SHE NEEDS TO CUT BACK ON SALTY FOODS AND DRINK PLENTY OF WATER. PATIENT WAS ADVSIED THAT HER BP OF 138/98 IS NOT THAT HIGH AND GENERALLY DOES NOT CAUSE DIZZINESS. PATIENT MAY HAVE SOMETHING ELSE GOING ON WE ADVISED HER THAT IF THE DIZZINESS IS REAL BAD SHE NEEDS TO GO TO THE ER. YRL,RMA

## 2018-12-13 ENCOUNTER — Telehealth: Payer: Self-pay

## 2018-12-13 NOTE — Telephone Encounter (Signed)
Cindee Salt from [facility] called stating the patient's blood pressure is 120/98 and she is complaining of lightheadness and dizziness and it worsens when standing.  RETURNED HIS CALL AND ADVISED HIM THAT SHE NEEDS TO CUT BACK ON SALTY FOODS AND DRINK PLENTY OF WATER. PATIENT WAS ADVSIED THAT HER BP OF 138/98 IS NOT THAT HIGH AND GENERALLY DOES NOT CAUSE DIZZINESS. PATIENT MAY HAVE SOMETHING ELSE GOING ON WE ADVISED HER THAT IF THE DIZZINESS IS REAL BAD SHE NEEDS TO GO TO THE ER. YRL,RMA

## 2018-12-17 ENCOUNTER — Telehealth: Payer: Self-pay

## 2018-12-17 ENCOUNTER — Ambulatory Visit: Payer: Self-pay

## 2018-12-17 ENCOUNTER — Other Ambulatory Visit: Payer: Self-pay

## 2018-12-17 DIAGNOSIS — I10 Essential (primary) hypertension: Secondary | ICD-10-CM

## 2018-12-17 DIAGNOSIS — M545 Low back pain, unspecified: Secondary | ICD-10-CM

## 2018-12-17 DIAGNOSIS — I4891 Unspecified atrial fibrillation: Secondary | ICD-10-CM

## 2018-12-17 DIAGNOSIS — G8929 Other chronic pain: Secondary | ICD-10-CM

## 2018-12-17 NOTE — Chronic Care Management (AMB) (Signed)
Care Management   Follow Up Note   12/17/2018 Name: Kimberly [name] MRN: [id number] DOB: Jul 18, 1956  Referred by: [name], FNP Reason for referral : Chronic Care Management (CM RN Initial Telephone Outreach )   Kimberly [name] is a 63 y.o. year old female who is a primary care patient of [name], [place]. The CCM team was consulted for assistance with chronic disease management and care coordination needs.    Review of patient status, including review of consultants reports, relevant laboratory and other test results, and collaboration with appropriate care team members and the patient's provider was performed as part of comprehensive patient evaluation and provision of chronic care management services.    I spoke with [name] and her sister [name] by telephone today.   Goals Addressed      Patient Stated   . Chronic Care Management Needs (pt-stated)       Current Barriers:   Ineffective Self Health Maintenance  Knowledge Deficits related to short term plan for care coordination needs and long term plans for chronic disease management needs  Nurse Case Manager Clinical Goal(s):   Over the next 90 days, patient will work with care management team to address care coordination and chronic disease management needs related to Disease Management, Educational Needs, Care Coordination    Interventions:   Completed initial RN CM Telephone outreach to patient and sister [name]  Assessed for chronic disease management and education needs  Assessed for community resource needs  Assessed for home safety concerns and or care coordination needs . Discussed plans with patient/sister for ongoing care management follow up and provided patient with direct contact information for care management team . Sent in basket message to Velva requesting she contact sister [name] to advise of transportation status as well as provide additional resources for caregiver  assistance and hand railing for exterior steps leading into patient's apartment . Provided RN CM contact # to sister [name] and discussed nurse availability . Scheduled a CCM telephone follow up call with patient   Patient Self Care Activities:   Verbalizes understanding of the education/information provided today  Currently UNABLE TO independently self manage needs related to chronic health conditions.   Please see past updates related to this goal by clicking on the "Past Updates" button in the selected goal       Other   . She needs help with her medications       Current Barriers:  [name] Knowledge Deficits related to Medication Administration and Long Term Management  Nurse Case Manager Clinical Goal(s):  [name] Over the next 30 days, patient will work with [facility] to address needs related to education and assistance with medication administration and management.   Interventions:   Completed CCM RN initial telephone outreach to patient and sister [name] . Evaluation of current treatment plan related to Medication Management and patient's adherence to plan as established by provider . Discussed plans with patient/sister for ongoing care management follow up and provided patient with direct contact information for care management team . [facility] to [facility] [phone], spoke with nurse manager Kenney Houseman; discussed patient is in need of assistance with medication management and setting up her pill box; Kenney Houseman will notify the assigned nurse Cassandra . Provided RN CM contact # to sister [name] and discussed nurse availability . Scheduled a CCM telephone follow up call with patient   Patient Self Care Activities:   Verbalizes understanding  of the education/information provided today . Currently UNABLE TO independently self administer medications; unable to independently perform ADL's and IADL's   Initial goal documentation    . We need a Rx for a  BP cuff       Current Barriers:  [name] Knowledge Deficits related to treatment management for Hypertension  . Patient does not currently have a BP cuff in her home  Nurse Case Manager Clinical Goal(s):   Over the next 14 days, patient will have a BP cuff in her home for daily BP checks.  . Over the next 30 days, patient will work with provider to address needs related to Hypertension treatment management.   Interventions:   Completed CCM RN initial telephone outreach to patient and sister [name] . Evaluation of current treatment plan related to Hypertension Management and patient's adherence to plan as established by provider . Discussed plans with patient/sister for ongoing care management follow up and provided patient with direct contact information for care management team . Sent in basket message to provider [name], FNP requesting Rx for BP cuff be sent to patients pharmacy for pick up . Provided RN CM contact # to sister [name] and discussed nurse availability . Scheduled a CCM telephone follow up call with patient   Patient Self Care Activities:   Verbalizes understanding of the education/information provided today . Currently UNABLE TO independently self administer medications; unable to independently perform ADL's and IADL's   Initial goal documentation        Telephone follow up appointment with CM team member scheduled for: 12/20/18   Barb Merino, [facility] Care Management Coordinator [place] [facility]  Direct Phone: [phone]

## 2018-12-17 NOTE — Patient Instructions (Signed)
Visit Information  Goals Addressed      Patient Stated   . Chronic Care Management Needs (pt-stated)       Current Barriers:   Ineffective Self Health Maintenance  Knowledge Deficits related to short term plan for care coordination needs and long term plans for chronic disease management needs  Nurse Case Manager Clinical Goal(s):   Over the next 90 days, patient will work with care management team to address care coordination and chronic disease management needs related to Disease Management, Educational Needs, Care Coordination    Interventions:   Completed initial RN CM Telephone outreach to patient and sister [name]  Assessed for chronic disease management and education needs  Assessed for community resource needs  Assessed for home safety concerns and or care coordination needs . Discussed plans with patient/sister for ongoing care management follow up and provided patient with direct contact information for care management team . Sent in basket message to [place] requesting she contact sister [name] to advise of transportation status as well as provide additional resources for caregiver assistance and hand railing for exterior steps leading into patient's apartment . Provided RN CM contact # to sister [name] and discussed nurse availability . Scheduled a CCM telephone follow up call with patient   Patient Self Care Activities:   Verbalizes understanding of the education/information provided today  Currently UNABLE TO independently self manage needs related to chronic health conditions.   Please see past updates related to this goal by clicking on the "Past Updates" button in the selected goal       Other   . She needs help with her medications       Current Barriers:  [name] Knowledge Deficits related to Medication Administration and Long Term Management  Nurse Case Manager Clinical Goal(s):  [name] Over the next 30 days, patient will work with [facility] to address needs related to education and assistance with medication administration and management.   Interventions:   Completed CCM RN initial telephone outreach to patient and sister [name] . Evaluation of current treatment plan related to Medication Management and patient's adherence to plan as established by provider . Discussed plans with patient/sister for ongoing care management follow up and provided patient with direct contact information for care management team . [facility] call to [facility] [phone], spoke with nurse manager Kenney Houseman; discussed patient is in need of assistance with medication management and setting up her pill box; Kenney Houseman will notify the assigned nurse Cassandra . Provided RN CM contact # to sister [name] and discussed nurse availability . Scheduled a CCM telephone follow up call with patient   Patient Self Care Activities:   Verbalizes understanding of the education/information provided today . Currently UNABLE TO independently self administer medications; unable to independently perform ADL's and IADL's   Initial goal documentation     . We need a Rx for a BP cuff       Current Barriers:  [name] Knowledge Deficits related to treatment management for Hypertension  . Patient does not currently have a BP cuff in her home  Nurse Case Manager Clinical Goal(s):   Over the next 14 days, patient will have a BP cuff in her home for daily BP checks.  . Over the next 30 days, patient will work with provider to address needs related to Hypertension treatment management.   Interventions:   Completed CCM RN initial telephone outreach to patient and sister [name]  [place] . Evaluation of current treatment plan related to Hypertension Management and patient's adherence to plan as established by provider . Discussed plans with patient/sister for ongoing care management follow up and provided patient with direct contact information for care  management team . Sent in basket message to provider [name], FNP requesting Rx for BP cuff be sent to patients pharmacy for pick up . Provided RN CM contact # to sister [name] and discussed nurse availability . Scheduled a CCM telephone follow up call with patient    Patient Self Care Activities:   Verbalizes understanding of the education/information provided today . Currently UNABLE TO independently self administer medications; unable to independently perform ADL's and IADL's   Initial goal documentation       The patient verbalized understanding of instructions provided today and declined a print copy of patient instruction materials.   Telephone follow up appointment with CCM team member scheduled for: 12/20/18  Barb Merino, [facility] Care Management Coordinator [place] [facility]  Direct Phone: [phone]

## 2018-12-18 ENCOUNTER — Telehealth: Payer: Self-pay

## 2018-12-19 ENCOUNTER — Telehealth: Payer: Self-pay

## 2018-12-19 ENCOUNTER — Ambulatory Visit: Payer: Self-pay

## 2018-12-19 DIAGNOSIS — R7303 Prediabetes: Secondary | ICD-10-CM

## 2018-12-19 DIAGNOSIS — I10 Essential (primary) hypertension: Secondary | ICD-10-CM

## 2018-12-19 DIAGNOSIS — H547 Unspecified visual loss: Secondary | ICD-10-CM

## 2018-12-19 DIAGNOSIS — Z741 Need for assistance with personal care: Secondary | ICD-10-CM

## 2018-12-19 DIAGNOSIS — I4891 Unspecified atrial fibrillation: Secondary | ICD-10-CM

## 2018-12-19 DIAGNOSIS — G8929 Other chronic pain: Secondary | ICD-10-CM

## 2018-12-19 DIAGNOSIS — M545 Low back pain, unspecified: Secondary | ICD-10-CM

## 2018-12-19 NOTE — Chronic Care Management (AMB) (Signed)
  Chronic Care Management    Clinical Social Work Follow Up Note  12/19/2018 Name: Emmalou Hunger MRN: [id number] DOB: September 19, 1955  Amrit Erck is a 63 y.o. year old female who is a primary care patient of [name], [place]. The CCM team was consulted for assistance with [employer].   Review of patient status, including review of consultants reports, other relevant assessments, and collaboration with appropriate care team members and the patient's provider was performed as part of comprehensive patient evaluation and provision of chronic care management services.     I placed a successful call to the patients sister, [name], whom is listed on the patients DPR. Goals Addressed            This Visit's Progress   . "My sister needs grab bars and a hand railing"       Sister stated: Current Barriers:  . Financial constraints . Housing barriers . Inability to perform IADL's independently  Clinical Social Work Clinical Goal(s):  [name] Over the next 45 days, patient will work with SW to address needs related to home modifications  Interventions: . Interviewed the patients sister whom is listed on [place] . Determined the patient has contacted her property manager to request home modifications . Collaboration with the patients primary care provider regarding documentation needed by property manager to install equipment . Encouraged the patients sister to contact DSS to determine if Medicaid could help cover the cost the patient will incur  Patient Self Care Activities:  . Currently UNABLE TO independently transfer into the shower safely . Currently UNABLE to exit apartment independently  Initial goal documentation     . "she needs more caregiver hours"       Sister stated: Current Barriers:  . Financial constraints . Level of care concerns . Inability to perform ADL's independently . Inability to perform IADL's independently  Clinical Social Work Clinical Goal(s):   [name] Over the next 45  days, patient will work with SW to address needs related to in home care  Interventions:  Gala Murdoch patient's sister whom is listed on DPR  Assessed for patient needs- the patient currently receive 4 hours per day of PCS hours.   Educated the patient family member that the patient most likely has met the maximum hours allotted by [facility] care (80 hours per month)  Advised the patients family member to contact [facility] to discuss ability to increase hours  Determined the patient has been on the wait list for CAP service hours since October 2019  Determined the patient is not willing to consider long term care placement into a SNF or ALF  Educated the patients family member on [facility] program  Mailed information to the patient on [facility]  Patient Self Care Activities:  . Currently UNABLE TO independently perform ADL's or iADL's . Attends all scheduled provider appointments . Calls provider office for new concerns or questions  Initial goal documentation         Follow Up Plan: SW will follow up with patient by phone over the next 3-4 weeks   [name], [place], CDP [facility] / [facility] Care Management Social Worker [phone]  Total time spent performing care coordination and/or care management activities with the patient by phone or face to face = 35 minutes.

## 2018-12-19 NOTE — Telephone Encounter (Signed)
Patient's [facility] for [facility] blood [facility] and her letter has been written her sister [name] will come to the office and pick it up on Monday. YRL,RMA

## 2018-12-19 NOTE — Patient Instructions (Signed)
Social Worker Visit Information  Goals we discussed today:  Goals Addressed            This Visit's Progress   . "My sister needs grab bars and a hand railing"       Sister stated: Current Barriers:  . Financial constraints . Housing barriers . Inability to perform IADL's independently  Clinical Social Work Clinical Goal(s):  [name] Over the next 45 days, patient will work with SW to address needs related to home modifications  Interventions: . Interviewed the patients sister whom is listed on [place] . Determined the patient has contacted her property manager to request home modifications . Collaboration with the patients primary care provider regarding documentation needed by property manager to install equipment . Encouraged the patients sister to contact DSS to determine if Medicaid could help cover the cost the patient will incur  Patient Self Care Activities:  . Currently UNABLE TO independently transfer into the shower safely . Currently UNABLE to exit apartment independently  Initial goal documentation     . "she needs more caregiver hours"       Sister stated: Current Barriers:  . Financial constraints . Level of care concerns . Inability to perform ADL's independently . Inability to perform IADL's independently  Clinical Social Work Clinical Goal(s):  [name] Over the next 45  days, patient will work with SW to address needs related to in home care  Interventions:  Gala Murdoch patient's sister whom is listed on DPR  Assessed for patient needs- the patient currently receive 4 hours per day of PCS hours.   Educated the patient family member that the patient most likely has met the maximum hours allotted by [facility] care (80 hours per month)  Advised the patients family member to contact [facility] to discuss ability to increase hours  Determined the patient has been on the wait list for CAP service hours since October 2019  Determined the patient is not willing to  consider long term care placement into a SNF or ALF  Educated the patients family member on [facility] program  Mailed information to the patient on [facility]  Patient Self Care Activities:  . Currently UNABLE TO independently perform ADL's or iADL's . Attends all scheduled provider appointments . Calls provider office for new concerns or questions  Initial goal documentation         Materials Provided: Yes: Information on [facility] provided via mail  Follow Up Plan: SW will follow up with patient by phone over the next 3-4 weeks   [name], [place], CDP [facility] / [place] Worker [phone]

## 2018-12-20 ENCOUNTER — Telehealth: Payer: Self-pay

## 2018-12-20 NOTE — Chronic Care Management (AMB) (Signed)
Care Management   Follow Up Note   12/19/2018 Name: Kimberly [name] MRN: [id number] DOB: 03-29-1956  Referred by: [name], FNP Reason for referral : Care Coordination (CM RN Follow Up )   Kimberly [name] is a 63 y.o. year old female who is a primary care patient of [name], [place]. The CCM team was consulted for assistance with chronic disease management and care coordination needs.    Review of patient status, including review of consultants reports, relevant laboratory and other test results, and collaboration with appropriate care team members and the patient's provider was performed as part of comprehensive patient evaluation and provision of chronic care management services.    I spoke with [facility] RN by telephone today to follow up on patient's BP status and discuss medication management for this patient.   Goals Addressed    . She needs help with her medications       Current Barriers:  [name] Knowledge Deficits related to Medication Administration and Long Term Management  Nurse Case Manager Clinical Goal(s):  [name] Over the next 30 days, patient will work with [facility] to address needs related to education and assistance with medication administration and management.   Interventions:  . Placed [facility]bound call to [facility] [phone], spoke with nurse manager Kenney Houseman; advised I have not been able to reach assigned nurse Jeralene Peters due to no return call after several attempts-Tonya will have the nurse call me asap . Inbound call received from [place] [phone]; patient status update received . Discussed patient's BP is stable today; discussed patient's need for further assistance with medication management; pt is not able to self administer meds; family has placed meds out of sight of patient due to patient taking too many meds in the past; Discussed that embedded BSW [name] is working with  patient/sister regarding community resources; Discussed that I will send an embedded Pharm D referral and request pharmacist assist with transitioning patient to pill package system . Pharmacy referral sent to [facility] . Discussed nurse Vaughan Basta will contact me as needed for any updates and or concerns  Patient Self Care Activities:  . Currently UNABLE TO independently self administer medications; unable to independently perform ADL's and IADL's   Please see past updates related to this goal by clicking on the "Past Updates" button in the selected goal         The CM team will reach out to the patient again over the next 5-7 days.    Barb Merino, RN,CCM Care Management Coordinator [place] [facility]  Direct Phone: [phone]

## 2018-12-20 NOTE — Patient Instructions (Signed)
Visit Information  Goals Addressed    . She needs help with her medications       Current Barriers:  [name] Knowledge Deficits related to Medication Administration and Long Term Management  Nurse Case Manager Clinical Goal(s):  [name] Over the next 30 days, patient will work with [facility] to address needs related to education and assistance with medication administration and management.   Interventions:  . Placed [facility] call to [facility] [phone], spoke with nurse manager Kenney Houseman; advised I have not been able to reach assigned nurse Jeralene Peters due to no return call after several attempts-Tonya will have the nurse call me asap . Inbound call received from [place] [phone]; patient status update received . Discussed patient's BP is stable today; discussed patient's need for further assistance with medication management; pt is not able to self administer meds; family has placed meds out of sight of patient due to patient taking too many meds in the past; Discussed that embedded BSW [name] is working with patient/sister regarding community resources; Discussed that I will send an embedded Pharm D referral and request pharmacist assist with transitioning patient to pill package system . Pharmacy referral sent to [facility] . Discussed nurse Vaughan Basta will contact me as needed for any updates and or concerns  Patient Self Care Activities:  . Currently UNABLE TO independently self administer medications; unable to independently perform ADL's and IADL's   Please see past updates related to this goal by clicking on the "Past Updates" button in the selected goal        The patient verbalized understanding of instructions provided today and declined a print copy of patient instruction materials.   The CM team will reach out to the patient again over the next 5-7 days.   Barb Merino, RN,CCM Care Management Coordinator [place]  [facility]  Direct Phone: [phone]

## 2018-12-23 ENCOUNTER — Telehealth: Payer: Self-pay

## 2018-12-24 ENCOUNTER — Telehealth: Payer: Self-pay

## 2019-01-01 ENCOUNTER — Encounter: Payer: Self-pay | Admitting: Nurse Practitioner

## 2019-01-09 ENCOUNTER — Ambulatory Visit: Payer: Self-pay

## 2019-01-09 ENCOUNTER — Telehealth: Payer: Self-pay

## 2019-01-09 DIAGNOSIS — I10 Essential (primary) hypertension: Secondary | ICD-10-CM

## 2019-01-09 DIAGNOSIS — I4891 Unspecified atrial fibrillation: Secondary | ICD-10-CM

## 2019-01-09 NOTE — Chronic Care Management (AMB) (Signed)
  Chronic Care Management   Outreach Note  01/09/2019 Name: Kimberly [name] MRN: [id number] DOB: 1955-09-02  Referred by: [name], FNP Reason for referral : Care Coordination   An unsuccessful telephone outreach was attempted today to follow up on progress of patient stated goals. The patient did not answer with no option to leave a voice message.   Follow Up Plan: The CM team will reach out to the patient again over the next 10-14 days.   [name], BSW, CDP Social Worker, Certified Dementia Practitioner [place] / [place] [facility] [phone]  Total time spent performing care coordination and/or care management activities with the patient by phone or face to face = 3 minutes.

## 2019-01-11 ENCOUNTER — Other Ambulatory Visit: Payer: Self-pay | Admitting: Nurse Practitioner

## 2019-01-13 NOTE — Progress Notes (Signed)
NEUROLOGY CONSULTATION NOTE  Kimberly [name] MRN: [id number] DOB: [birth year]  Referring provider: [name], FNP Primary care provider: [name], FNP  Reason for consult:  Cerebrovascular accident  HISTORY OF PRESENT ILLNESS: Kimberly [name] is a 63 year old right-handed black woman with hypertension, hyperlipidemia, paroxysmal atrial fibrillation, coronary artery disease and prior history of strokes who presents for stroke.  History supplemented by hospital records. MRI and CT head/CTA head and neck were personally reviewed.  She is accompanied by her sister who supplements history.  Kimberly [name] was admitted to [facility] on 02/13/18 for stroke presenting as dizziness, fall and left hemianopia.  She was previously on aspirin and Plavix.  She was not given tPA due to outside therapeutic window.  She was found to have new atrial fibrillation.  CT of head revealed right acute to subacute PCA infarct.  MRI of brain confirmed large acute right PCA territory infarct.  CTA of head and neck showed bilateral ICA occlusions at origin, right PCA occlusion and basilar artery irregularity but bilateral MCAs patent from collaterals.  Carotid doppler showed bilateral ICA occlusion with possible left ICA fresh clot.  Transcranial doppler showed left ophthalmic artery and right ACA flow reversal consistent with bilateral carotid system occlusion.  2D echo showed EF 60-65%.  LDL was 100.  Hgb A1c was 5.8.  She remained on ASA and Plavix.  Due to large size of stroke, initiation of anticoagulation was postponed until 5 to 7 days post stroke to avoid hemorrhagic conversion.  She was subsequently started on Eliquis.  Plavix subsequently discontinued.  She was started on Lipitor 80mg .    She has residual left sided weakness and pain.  She uses a walker.  She uses a stress ball to strengthen her hand.  Medicaid only approves 3 sessions a year of physical therapy. She smokes cigarettes (about 1 pack per  week)  PAST MEDICAL HISTORY: Past Medical History:  Diagnosis Date  . Bicipital tenosynovitis   . Coronary artery disease 05/2007   [id number]   . Fall 02/13/2018  . Family history of ischemic heart disease   . Gout   . Hammer toe   . Hip, thigh, leg, and ankle, insect bite, nonvenomous, without mention of infection(916.4)   . Hyperlipidemia   . Hypertension   . Other abnormality of red blood cells   . Rotator cuff syndrome of left shoulder   . Stroke [facility])     PAST SURGICAL HISTORY: Past Surgical History:  Procedure Laterality Date  . CARDIAC CATHETERIZATION    . CORONARY ARTERY BYPASS GRAFT     triple  . CORONARY ARTERY BYPASS GRAFT  2008  . TUBAL LIGATION      MEDICATIONS: Current Outpatient Medications on File Prior to Visit  Medication Sig Dispense Refill  . acetaminophen (TYLENOL) 500 MG tablet Take 1,000 mg by mouth every 6 (six) hours as needed for moderate pain.    [name] apixaban (ELIQUIS) 5 MG TABS tablet Take 1 tablet (5 mg total) by mouth 2 (two) times daily. 180 tablet 3  . aspirin EC 81 MG tablet Take 81 mg by mouth daily.    [name] atorvastatin (LIPITOR) 80 MG tablet TAKE 1 TABLET BY MOUTH DAILY (Patient taking differently: Take 80 mg by mouth daily. ) 90 tablet 1  . MITIGARE 0.6 MG CAPS Take 0.6 mg by mouth 2 (two) times daily as needed (gout attacks).    [name] omeprazole (PRILOSEC) 20 MG capsule TAKE 1 CAPSULE BY MOUTH EVERY DAY (  Patient taking differently: Take 20 mg by mouth daily. ) 90 capsule 1   No current facility-administered medications on file prior to visit.     ALLERGIES: Allergies  Allergen Reactions  . Clonidine Derivatives Other (See Comments)    Patch causes scars  . Spironolactone Rash    FAMILY HISTORY: Family History  Problem Relation Age of Onset  . Hypertension Mother   . Heart attack Father   . Diabetes Father   . Anxiety disorder Sister   . Sarcoidosis Sister    SOCIAL HISTORY: Social History   Socioeconomic History  . Marital  status: Single    Spouse name: Not on file  . Number of children: Not on file  . Years of education: Not on file  . Highest education level: Not on file  Occupational History  . Not on file  Social Needs  . Financial resource strain: Hard  . Food insecurity:    Worry: Sometimes true    Inability: Often true  . Transportation needs:    Medical: Yes    Non-medical: Yes  Tobacco Use  . Smoking status: Light Tobacco Smoker    Types: Cigarettes  . Smokeless tobacco: Former Systems developer    Quit date: 08/09/2008  Substance and Sexual Activity  . Alcohol use: Not Currently    Comment: not currently drinking due to gout  . Drug use: Yes    Types: Marijuana    Comment: history of cocaine use  . Sexual activity: Not on file  Lifestyle  . Physical activity:    Days per week: 0 days    Minutes per session: 0 min  . Stress: Only a little  Relationships  . Social connections:    Talks on phone: Not on file    Gets together: Not on file    Attends religious service: Not on file    Active member of club or organization: Not on file    Attends meetings of clubs or organizations: Not on file    Relationship status: Not on file  . Intimate partner violence:    Fear of current or ex partner: Not on file    Emotionally abused: Not on file    Physically abused: Not on file    Forced sexual activity: Not on file  Other Topics Concern  . Not on file  Social History Narrative  . Not on file    REVIEW OF SYSTEMS: Constitutional: No fevers, chills, or sweats, no generalized fatigue, change in appetite Eyes: No visual changes, double vision, eye pain Ear, nose and throat: No hearing loss, ear pain, nasal congestion, sore throat Cardiovascular: No chest pain, palpitations Respiratory:  No shortness of breath at rest or with exertion, wheezes GastrointestinaI: No nausea, vomiting, diarrhea, abdominal pain, fecal incontinence Genitourinary:  No dysuria, urinary retention or frequency  Musculoskeletal:  Left sided pain Integumentary: No rash, pruritus, skin lesions Neurological: as above Psychiatric: No depression, insomnia, anxiety Endocrine: No palpitations, fatigue, diaphoresis, mood swings, change in appetite, change in weight, increased thirst Hematologic/Lymphatic:  No purpura, petechiae. Allergic/Immunologic: no itchy/runny eyes, nasal congestion, recent allergic reactions, rashes  PHYSICAL EXAM: Blood pressure 138/82, pulse 74, temperature 98.2 F (36.8 C), height 5\' 6"  (1.676 m), weight 131 lb (59.4 kg), SpO2 100 %. General: No acute distress.  Patient appears well-groomed.   Head:  Normocephalic/atraumatic Eyes:  fundi examined but not visualized Neck: supple, no paraspinal tenderness, full range of motion Back: No paraspinal tenderness Heart: regular rate and rhythm Lungs: Clear to  auscultation bilaterally. Vascular: No carotid bruits. Neurological Exam: Mental status: alert and oriented to person, place, and time, recent and remote memory intact, fund of knowledge intact, attention and concentration intact, speech fluent and not dysarthric, language intact. Cranial nerves: CN I: not tested CN II: pupils equal, round and reactive to light, visual fields intact CN III, IV, VI:  full range of motion, no nystagmus, no ptosis CN V: facial sensation intact CN VII: upper and lower face symmetric CN VIII: hearing intact CN IX, X: gag intact, uvula midline CN XI: sternocleidomastoid and trapezius muscles intact CN XII: tongue midline Bulk & Tone: increased tone in left upper and lower extremities, no fasciculations. Motor:  4+/5 left upper extremity except 3+/5 grip, 3+/5 left proximal lower extremity, 4+/5 left lower extremity, 5/5 right side.  Decreased finger thumb tapping speed and amplitude. Sensation:  Hyperesthesia to pinprick on left, decreased vibratory sensation on left. Deep Tendon Reflexes:  3+ throughout left upper and lower extremities, 2+ on  right. Finger to nose testing:  Without dysmetria.  Gait:  Pushes off arm rests to stand up.  Unsteady, hemiplegic gait.  Requires assistance to ambulate.  IMPRESSION: 1.  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  Paroxysmal atrial fibrillation with RVR 3.  Cerebrovascular disease with occlusion and stenosis 4.  Hypertension 5.  Hyperlipidemia  6.  Coronary artery disease 7.  Tobacco use disorder.   PLAN: Secondary stroke prevention management as per PCP 1.  Eliquis for secondary stroke prevention and ASA (CAD) 2.  Lipitor 80mg  daily (LDL goal less than 70) 3.  Blood pressure control 4.  Mediterranean diet 5.  Tobacco cessation counseling (CPT 99406):  Tobacco use with history of CAD, stroke  - Currently smoking 1 pack/week - Patient was informed of the dangers of tobacco abuse including stroke, cancer, and MI, as well as benefits of tobacco cessation. - Patient is willing to quit at this time. - Approximately 5 mins were spent counseling patient cessation techniques. We discussed various methods to help quit smoking, including deciding on a date to quit, joining a support group, pharmacological agents- nicotine gum/patch/lozenges, chantix.  - I will reassess her progress at the next follow-up visit 6.  For neuropathic pain, start gabapentin 100mg , titrating to 3 times daily. 7.  Follow up in 6 months.  Thank you for allowing me to take part in the care of this patient.  Metta Clines, DO  CC: [name], FNP

## 2019-01-14 ENCOUNTER — Other Ambulatory Visit: Payer: Self-pay

## 2019-01-14 ENCOUNTER — Encounter: Payer: Self-pay | Admitting: Neurology

## 2019-01-14 ENCOUNTER — Ambulatory Visit (INDEPENDENT_AMBULATORY_CARE_PROVIDER_SITE_OTHER): Payer: Medicaid Other | Admitting: Neurology

## 2019-01-14 ENCOUNTER — Ambulatory Visit: Payer: Self-pay | Admitting: Pharmacist

## 2019-01-14 VITALS — BP 138/82 | HR 74 | Temp 98.2°F | Ht 66.0 in | Wt 131.0 lb

## 2019-01-14 DIAGNOSIS — I10 Essential (primary) hypertension: Secondary | ICD-10-CM

## 2019-01-14 DIAGNOSIS — F17200 Nicotine dependence, unspecified, uncomplicated: Secondary | ICD-10-CM | POA: Diagnosis not present

## 2019-01-14 DIAGNOSIS — I69359 Hemiplegia and hemiparesis following cerebral infarction affecting unspecified side: Secondary | ICD-10-CM

## 2019-01-14 DIAGNOSIS — I63431 Cerebral infarction due to embolism of right posterior cerebral artery: Secondary | ICD-10-CM

## 2019-01-14 DIAGNOSIS — I480 Paroxysmal atrial fibrillation: Secondary | ICD-10-CM | POA: Diagnosis not present

## 2019-01-14 DIAGNOSIS — I4891 Unspecified atrial fibrillation: Secondary | ICD-10-CM

## 2019-01-14 DIAGNOSIS — E785 Hyperlipidemia, unspecified: Secondary | ICD-10-CM

## 2019-01-14 MED ORDER — GABAPENTIN 100 MG PO CAPS: ORAL_CAPSULE | ORAL | 0 refills | Status: DC

## 2019-01-14 NOTE — Patient Instructions (Addendum)
1.  Continue Eliquis and aspirin 2.  Continue atorvastatin 80mg  daily 3.  To help with left sided pain, will start gabapentin 100mg :  Take 1 capsule at bedtime for one week, then 1 capsule twice daily for one week, then 1 capsule three times daily 4.  Try to quit smoking 5.  Mediterranean diet (see below) 6.  Follow up in 6 months   Mediterranean Diet A Mediterranean diet refers to food and lifestyle choices that are based on the traditions of countries located on the [employer]. This way of eating has been shown to help prevent certain conditions and improve outcomes for people who have chronic diseases, like kidney disease and heart disease. What are tips for following this plan? Lifestyle  Cook and eat meals together with your family, when possible.  Drink enough fluid to keep your urine clear or pale yellow.  Be physically active every day. This includes: ? Aerobic exercise like running or swimming. ? Leisure activities like gardening, walking, or housework.  Get 7-8 hours of sleep each night.  If recommended by your health care provider, drink red wine in moderation. This means 1 glass a day for nonpregnant women and 2 glasses a day for men. A glass of wine equals 5 oz (150 mL). Reading food labels   Check the serving size of packaged foods. For foods such as rice and pasta, the serving size refers to the amount of cooked product, not dry.  Check the total fat in packaged foods. Avoid foods that have saturated fat or trans fats.  Check the ingredients list for added sugars, such as corn syrup. Shopping  At the grocery store, buy most of your food from the areas near the walls of the store. This includes: ? Fresh fruits and vegetables (produce). ? Grains, beans, nuts, and seeds. Some of these may be available in unpackaged forms or large amounts (in bulk). ? Fresh seafood. ? Poultry and eggs. ? Low-fat dairy products.  Buy whole ingredients instead of prepackaged  foods.  Buy fresh fruits and vegetables in-season from local farmers markets.  Buy frozen fruits and vegetables in resealable bags.  If you do not have access to quality fresh seafood, buy precooked frozen shrimp or canned fish, such as tuna, salmon, or sardines.  Buy small amounts of raw or cooked vegetables, salads, or olives from the deli or salad bar at your store.  Stock your pantry so you always have certain foods on hand, such as olive oil, canned tuna, canned tomatoes, rice, pasta, and beans. Cooking  Cook foods with extra-virgin olive oil instead of using butter or other vegetable oils.  Have meat as a side dish, and have vegetables or grains as your main dish. This means having meat in small portions or adding small amounts of meat to foods like pasta or stew.  Use beans or vegetables instead of meat in common dishes like chili or lasagna.  Experiment with different cooking methods. Try roasting or broiling vegetables instead of steaming or sauteing them.  Add frozen vegetables to soups, stews, pasta, or rice.  Add nuts or seeds for added healthy fat at each meal. You can add these to yogurt, salads, or vegetable dishes.  Marinate fish or vegetables using olive oil, lemon juice, garlic, and fresh herbs. Meal planning   Plan to eat 1 vegetarian meal one day each week. Try to work up to 2 vegetarian meals, if possible.  Eat seafood 2 or more times a week.  Have  healthy snacks readily available, such as: ? Vegetable sticks with hummus. ? [place] yogurt. ? Fruit and nut trail mix.  Eat balanced meals throughout the week. This includes: ? Fruit: 2-3 servings a day ? Vegetables: 4-5 servings a day ? Low-fat dairy: 2 servings a day ? Fish, poultry, or lean meat: 1 serving a day ? Beans and legumes: 2 or more servings a week ? Nuts and seeds: 1-2 servings a day ? Whole grains: 6-8 servings a day ? Extra-virgin olive oil: 3-4 servings a day  Limit red meat and sweets  to only a few servings a month What are my food choices?  Mediterranean diet ? Recommended ? Grains: Whole-grain pasta. Brown rice. Bulgar wheat. Polenta. Couscous. Whole-wheat bread. [place]. ? Vegetables: Artichokes. Beets. Broccoli. Cabbage. Carrots. Eggplant. Green beans. Chard. Kale. Spinach. Onions. Leeks. Peas. Squash. Tomatoes. Peppers. Radishes. ? Fruits: Apples. Apricots. Avocado. Berries. Bananas. Cherries. Dates. Figs. Grapes. Lemons. Melon. Oranges. Peaches. Plums. Pomegranate. ? Meats and other protein foods: Beans. Almonds. Sunflower seeds. Pine nuts. Peanuts. [place]. Salmon. Scallops. Shrimp. [place]. Tilapia. Clams. Oysters. Eggs. ? Dairy: Low-fat milk. Cheese. Greek yogurt. ? Beverages: Water. Red wine. Herbal tea. ? Fats and oils: Extra virgin olive oil. Avocado oil. Grape seed oil. ? Sweets and desserts: [place] yogurt with honey. Baked apples. Poached pears. Trail mix. ? Seasoning and other foods: Basil. Cilantro. Coriander. Cumin. Mint. Parsley. Sage. Rosemary. Tarragon. Garlic. Oregano. Thyme. Pepper. Balsalmic vinegar. Tahini. Hummus. Tomato sauce. Olives. Mushrooms. ? Limit these ? Grains: Prepackaged pasta or rice dishes. Prepackaged cereal with added sugar. ? Vegetables: Deep fried potatoes (french fries). ? Fruits: Fruit canned in syrup. ? Meats and other protein foods: Beef. Pork. Lamb. Poultry with skin. Hot dogs. Berniece Salines. ? Dairy: Ice cream. Sour cream. Whole milk. ? Beverages: Juice. Sugar-sweetened soft drinks. Beer. Liquor and spirits. ? Fats and oils: Butter. Canola oil. Vegetable oil. Beef fat (tallow). Lard. ? Sweets and desserts: Cookies. Cakes. Pies. Candy. ? Seasoning and other foods: Mayonnaise. Premade sauces and marinades. ? The items listed may not be a complete list. Talk with your dietitian about what dietary choices are right for you. Summary  The Mediterranean diet includes both food and lifestyle choices.  Eat a variety of fresh fruits and  vegetables, beans, nuts, seeds, and whole grains.  Limit the amount of red meat and sweets that you eat.  Talk with your health care provider about whether it is safe for you to drink red wine in moderation. This means 1 glass a day for nonpregnant women and 2 glasses a day for men. A glass of wine equals 5 oz (150 mL). This information is not intended to replace advice given to you by your health care provider. Make sure you discuss any questions you have with your health care provider. Document Released: 03/23/2016 Document Revised: 04/25/2016 Document Reviewed: 03/23/2016 Elsevier Interactive Patient Education  2019 [employer].

## 2019-01-16 NOTE — Patient Instructions (Signed)
Visit Information  Goals Addressed            This Visit's Progress     Patient Stated   . Transition medications to compliance packaging (pt-stated)       Current Barriers:  [name] Knowledge Deficits related to Medication Administration and Long Term Management  Nurse Case Manager Clinical Goal(s):  [name] Over the next 30 days, patient will work with CCM team and PCP to address needs related to optimized medication management of chronic conditions.  Interventions:  . Placed call to [facility] nurse Jeralene Peters [phone]; RN states that [facility] is about to discharge patient.  RN does state that patient could benefit from  compliance packaging as she is s/p stroke. . Call placed to patient's home.  Gentleman that answered stated patient was at MD appt.  Will follow up within 5 days.  Patient Self Care Activities:  . Currently UNABLE TO independently self administer medications; unable to independently perform ADL's and IADL's    Initial goal documentation           The patient verbalized understanding of instructions provided today and declined a print copy of patient instruction materials.   The care management team will reach out to the patient again over the next 5 days.   Regina Eck, PharmD, BCPS Clinical Pharmacist, [facility] [place]: [phone]

## 2019-01-16 NOTE — Progress Notes (Signed)
  Chronic Care Management   Initial Visit Note  01/14/2019 Name: Anajulia Leyendecker MRN: [id number] DOB: 1956/06/20  Referred by: [name], FNP Reason for referral : Chronic Care Management   Rashay Barnette is a 63 y.o. year old female who is a primary care patient of [name], [place]. The CCM team was consulted for assistance with chronic disease management and care coordination needs.   Review of patient status, including review of consultants reports, relevant laboratory and other test results, and collaboration with appropriate care team members and the patient's provider was performed as part of comprehensive patient evaluation and provision of chronic care management services.    I spoke with patient's home health nurse by telephone today.   Objective:   Goals Addressed            This Visit's Progress     Patient Stated   . Transition medications to compliance packaging (pt-stated)       Current Barriers:  [name] Knowledge Deficits related to Medication Administration and Long Term Management  Nurse Case Manager Clinical Goal(s):  [name] Over the next 30 days, patient will work with CCM team and PCP to address needs related to optimized medication management of chronic conditions.  Interventions:  . Placed call to [facility] nurse Jeralene Peters [phone]; RN states that [facility] is about to discharge patient.  RN does state that patient could benefit from  compliance packaging as she is s/p stroke. . Call placed to patient's home.  Gentleman that answered stated patient was at MD appt.  Will follow up within 5 days.  Patient Self Care Activities:  . Currently UNABLE TO independently self administer medications; unable to independently perform ADL's and IADL's    Initial goal documentation            Plan:   The care management team will reach out to the patient again over the next 5 days.   Regina Eck, PharmD, BCPS Clinical Pharmacist, [facility] [place]: [phone]

## 2019-01-17 ENCOUNTER — Ambulatory Visit: Payer: Self-pay

## 2019-01-17 ENCOUNTER — Telehealth: Payer: Self-pay

## 2019-01-17 DIAGNOSIS — I10 Essential (primary) hypertension: Secondary | ICD-10-CM

## 2019-01-17 DIAGNOSIS — I4891 Unspecified atrial fibrillation: Secondary | ICD-10-CM

## 2019-01-17 DIAGNOSIS — Z741 Need for assistance with personal care: Secondary | ICD-10-CM

## 2019-01-17 NOTE — Chronic Care Management (AMB) (Signed)
Chronic Care Management    Clinical Social Work Follow Up Note  01/17/2019 Name: Kimberly [name] MRN: [id number] DOB: Sep 06, 1955  Kimberly [name] is a 63 y.o. year old female who is a primary care patient of [name], [place]. The CCM team was consulted for assistance with [employer].   Review of patient status, including review of consultants reports, other relevant assessments, and collaboration with appropriate care team members and the patient's provider was performed as part of comprehensive patient evaluation and provision of chronic care management services.     Goals Addressed            This Visit's Progress     Patient Stated   . COMPLETED: "I need options for transportation" (pt-stated)       Current Barriers:  . Financial constraints . ADL IADL limitations . Limited education about Medicaid transportation benefit . Lacks knowledge of community resource: SCAT  Clinical Social Work Clinical Goal(s):  [name] Over the next 30 days, client will work with SW to address concerns related to transportation  Interventions: . Outbound call to the patient to assess progression of patient stated goal . Determined the patient has been approved for SCAT and has successfully utilized service  Patient Self Care Activities:  . Currently UNABLE TO independently manage ADl's and iADL's  Please see past updates related to this goal by clicking on the "Past Updates" button in the selected goal        Other   . "she needs more caregiver hours"   Not on track    Sister stated: Current Barriers:  . Financial constraints . Level of care concerns . Inability to perform ADL's independently . Inability to perform IADL's independently  Clinical Social Work Clinical Goal(s):  [name] Over the next 45  days, patient will work with SW to address needs related to in home care  Interventions:  Outbound call to the patient to assess progression of patient stated goal  Informed by the  patient that she is only receiving 2.5 hours per day of PCS hours, the patient feels she should receive more  Educated the patient on the max allowed hours under the PCS program  Advised the patient that CCM SW would contact [facility] at the end of today's outreach to inquire if the patient may be eligible for increased caregiver hours  Reminded the patient that she is on the CAP wait list to receive more caregiver support, unfortunately this wait list is quite long   Educated the patient on [facility] as an alternate option to receive more support while in the home; the patient request CCM SW speak with her sister  Unsuccessful outbound call to the patients sister  Collaboration with [facility] to determine the patient is in fact at the max allowed hours for [facility] services.  Patient Self Care Activities:  . Currently UNABLE TO independently perform ADL's or iADL's . Attends all scheduled provider appointments . Calls provider office for new concerns or questions  Please see past updates related to this goal by clicking on the "Past Updates" button in the selected goal          Follow Up Plan: SW will follow up with patient by phone over the next 3-4 weeks  [name], BSW, CDP Social Worker, Certified Dementia Practitioner [place] [facility] [place] [facility] [phone]  Total time spent performing care coordination and/or care management activities with the patient by phone or face to face =  17 minutes.

## 2019-01-17 NOTE — Patient Instructions (Signed)
Visit Information  Goals Addressed            This Visit's Progress     Patient Stated   . COMPLETED: "I need options for transportation" (pt-stated)       Current Barriers:  . Financial constraints . ADL IADL limitations . Limited education about Medicaid transportation benefit . Lacks knowledge of community resource: SCAT  Clinical Social Work Clinical Goal(s):  [name] Over the next 30 days, client will work with SW to address concerns related to transportation  Interventions: . Outbound call to the patient to assess progression of patient stated goal . Determined the patient has been approved for SCAT and has successfully utilized service  Patient Self Care Activities:  . Currently UNABLE TO independently manage ADl's and iADL's  Please see past updates related to this goal by clicking on the "Past Updates" button in the selected goal        Other   . "she needs more caregiver hours"   Not on track    Sister stated: Current Barriers:  . Financial constraints . Level of care concerns . Inability to perform ADL's independently . Inability to perform IADL's independently  Clinical Social Work Clinical Goal(s):  [name] Over the next 45  days, patient will work with SW to address needs related to in home care  Interventions:  Outbound call to the patient to assess progression of patient stated goal  Informed by the patient that she is only receiving 2.5 hours per day of PCS hours, the patient feels she should receive more  Educated the patient on the max allowed hours under the PCS program  Advised the patient that CCM SW would contact [facility] at the end of today's outreach to inquire if the patient may be eligible for increased caregiver hours  Reminded the patient that she is on the CAP wait list to receive more caregiver support, unfortunately this wait list is quite long   Educated the patient on [facility] as an alternate option to receive more  support while in the home; the patient request CCM SW speak with her sister  Unsuccessful outbound call to the patients sister  Collaboration with [facility] to determine the patient is in fact at the max allowed hours for [facility] services.  Patient Self Care Activities:  . Currently UNABLE TO independently perform ADL's or iADL's . Attends all scheduled provider appointments . Calls provider office for new concerns or questions  Please see past updates related to this goal by clicking on the "Past Updates" button in the selected goal         The patient verbalized understanding of instructions provided today and declined a print copy of patient instruction materials.   Telephone follow up scheduled for the next 3-4 weeks  [name], BSW, CDP Social Worker, Certified Dementia Practitioner [place] [facility] [place] [facility] [phone]

## 2019-01-20 ENCOUNTER — Other Ambulatory Visit: Payer: Self-pay | Admitting: Nurse Practitioner

## 2019-01-20 ENCOUNTER — Ambulatory Visit: Payer: Self-pay

## 2019-01-20 ENCOUNTER — Telehealth: Payer: Self-pay

## 2019-01-20 DIAGNOSIS — Z741 Need for assistance with personal care: Secondary | ICD-10-CM

## 2019-01-20 NOTE — Chronic Care Management (AMB) (Signed)
Care Management   Follow Up Note   01/20/2019 Name: Kimberly [name] MRN: [id number] DOB: 1956-01-28  Referred by: Kimberly Brine, FNP Reason for referral : [place] is a 63 y.o. year old female who is a primary care patient of Kimberly [name], [place]. The care management team was consulted for assistance with care management and care coordination needs.    Review of patient status, including review of consultants reports, relevant laboratory and other test results, and collaboration with appropriate care team members and the patient's provider was performed as part of comprehensive patient evaluation and provision of chronic care management services.    I placed an outbound call to the patients sister, Kimberly [name], as requested by the patient regarding progression of patient stated goals.  Goals Addressed            This Visit's Progress     Patient Stated   . COMPLETED: "I need options for transportation" (pt-stated)       Current Barriers:  . Financial constraints . ADL IADL limitations . Limited education about Medicaid transportation benefit . Lacks knowledge of community resource: SCAT  Clinical Social Work Clinical Goal(s):  [name] Over the next 30 days, client will work with SW to address concerns related to transportation  Interventions: . Outbound call to the patient's sister regarding goal progression . Determined that although the patient is approved for SCAT services, the patient must have someone in the home to assist down the steps and to her wheelchair for SCAT to assist with transportation . Educated Kimberly [name] on [employer] and reminded her that CCM SW reviewed this policy with both the patient and her sister Kimberly [name]) prior to completing the SCAT application process . Informed by Kimberly [name] that she has contacted the SCAT office and was told "if you change what the application says we can help her out of the door and down the steps" . Collaboration with  Kimberly [name] program coordinator to clarify ability for hands on assistance . Determined the SCAT policy is to only provide hands on assistance if assisting a wheel chair bound patient down a ramp. The SCAT drivers are unable to provide hands on assistance to a patient to get down the steps and into a wheelchair . Outbound call to the patients sister to clarify this policy, Kimberly [name] stated understanding   Patient Self Care Activities:  . Currently UNABLE TO independently manage ADl's and iADL's  Please see past updates related to this goal by clicking on the "Past Updates" button in the selected goal        Other   . COMPLETED: "My sister needs grab bars and a hand railing" Goal not met       Sister stated: Current Barriers:  . Financial constraints . Housing barriers . Inability to perform IADL's independently  Clinical Social Work Clinical Goal(s):  [name] Over the next 45 days, patient will work with SW to address needs related to home modifications  Interventions: . Outbound call to the patients sister to assess progress of patient goal . Determined the patient is unable to receive funding for home modification under her Medicaid benefit . Revisited the option to have community resources assist if the patients landlord is willing to sign waivers and assist with the cost . Informed by the patients sister that the apartment manager is agreeable to modifications but only if the patient will cover the costs and use the apartment complex contracted maintenance workers . Encouraged the patients  sister to contact CCM SW if the landlord is interested in participating in home modification resources at a later date  Patient Self Care Activities:  . Currently UNABLE TO independently transfer into the shower safely . Currently UNABLE to exit apartment independently  Please see past updates related to this goal by clicking on the "Past Updates" button in the selected goal      .  COMPLETED: "she needs more caregiver hours" Goal not met       Sister stated: Current Barriers:  . Financial constraints . Level of care concerns . Inability to perform ADL's independently . Inability to perform IADL's independently  Clinical Social Work Clinical Goal(s):  [name] Over the next 45  days, patient will work with SW to address needs related to in home care  Interventions:  Outbound call to the patients sister Kimberly [name] to review options for increased caregiver hours  Advised Kimberly [name] that per [facility] the patient has maxed out all hours under the PCS benefit  Confirmed receipt of mailed information regarding [facility]  Determined the patient and Kimberly [name] have yet to decide if they are interested in enrolling in [facility]   Reviewed other options for caregiver's for the patient including CAP services and long term care placement. Kimberly [name] reports the patient will not be placed. Kimberly [name] would like CAP services for the patient   Re-educated Kimberly [name] on the CAP program including the extensive wait list to receive services  Encouraged Kimberly [name] to contact CCM SW if assistance with [facility] enrollment is desired  Patient Self Care Activities:  . Currently UNABLE TO independently perform ADL's or iADL's . Attends all scheduled provider appointments . Calls provider office for new concerns or questions  Please see past updates related to this goal by clicking on the "Past Updates" button in the selected goal          No further follow up required: The patient and her sister have been provided with all resource information to assist with reaching patient goals. CCM SW has encouraged the patient and her sister to contact CCM SW if/when the would like assistance in persuing resources to reach goals.  [name], BSW, CDP Social Worker, Certified Dementia Practitioner [place] [facility] [place] [facility] [phone]  Total time spent performing care coordination  and/or care management activities with the patient by phone or face to face = 18 minutes.

## 2019-01-20 NOTE — Patient Instructions (Signed)
Visit Information  Goals Addressed            This Visit's Progress     Patient Stated   . COMPLETED: "I need options for transportation" (pt-stated)       Current Barriers:  . Financial constraints . ADL IADL limitations . Limited education about Medicaid transportation benefit . Lacks knowledge of community resource: SCAT  Clinical Social Work Clinical Goal(s):  [name] Over the next 30 days, client will work with SW to address concerns related to transportation  Interventions: . Outbound call to the patient's sister regarding goal progression . Determined that although the patient is approved for SCAT services, the patient must have someone in the home to assist down the steps and to her wheelchair for SCAT to assist with transportation . Educated Kimberly [name] on [employer] and reminded her that CCM SW reviewed this policy with both the patient and her sister Kimberly [name]) prior to completing the SCAT application process . Informed by Kimberly [name] that she has contacted the SCAT office and was told "if you change what the application says we can help her out of the door and down the steps" . Collaboration with [place] program coordinator to clarify ability for hands on assistance . Determined the SCAT policy is to only provide hands on assistance if assisting a wheel chair bound patient down a ramp. The SCAT drivers are unable to provide hands on assistance to a patient to get down the steps and into a wheelchair . Outbound call to the patients sister to clarify this policy, Kimberly [name] stated understanding .   Patient Self Care Activities:  . Currently UNABLE TO independently manage ADl's and iADL's  Please see past updates related to this goal by clicking on the "Past Updates" button in the selected goal        Other   . COMPLETED: "My sister needs grab bars and a hand railing"       Sister stated: Current Barriers:  . Financial constraints . Housing barriers . Inability to perform  IADL's independently  Clinical Social Work Clinical Goal(s):  [name] Over the next 45 days, patient will work with SW to address needs related to home modifications  Interventions: . Outbound call to the patients sister to assess progress of patient goal . Determined the patient is unable to receive funding for home modification under her Medicaid benefit . Revisited the option to have community resources assist if the patients landlord is willing to sign waivers and assist with the cost . Informed by the patients sister that the apartment manager is agreeable to modifications but only if the patient will cover the costs and use the apartment complex contracted maintenance workers . Encouraged the patients sister to contact CCM SW if the landlord is interested in participating in home modification resources at a later date  Patient Self Care Activities:  . Currently UNABLE TO independently transfer into the shower safely . Currently UNABLE to exit apartment independently  Please see past updates related to this goal by clicking on the "Past Updates" button in the selected goal      . COMPLETED: "she needs more caregiver hours"       Sister stated: Current Barriers:  . Financial constraints . Level of care concerns . Inability to perform ADL's independently . Inability to perform IADL's independently  Clinical Social Work Clinical Goal(s):  [name] Over the next 45  days, patient will work with SW to address needs related to in home care  Interventions:  Outbound call to the patients sister Kimberly [name] to review options for increased caregiver hours  Advised Kimberly [name] that per [facility] the patient has maxed out all hours under the PCS benefit  Confirmed receipt of mailed information regarding [facility]  Determined the patient and Kimberly [name] have yet to decide if they are interested in enrolling in [facility]   Reviewed other options for caregiver's for the patient including CAP  services and long term care placement. Kimberly [name] reports the patient will not be placed. Kimberly [name] would like CAP services for the patient   Re-educated Kimberly [name] on the CAP program including the extensive wait list to receive services  Encouraged Kimberly [name] to contact CCM SW if assistance with [facility] enrollment is desired  Patient Self Care Activities:  . Currently UNABLE TO independently perform ADL's or iADL's . Attends all scheduled provider appointments . Calls provider office for new concerns or questions  Please see past updates related to this goal by clicking on the "Past Updates" button in the selected goal         The patient verbalized understanding of instructions provided today and declined a print copy of patient instruction materials.   No further follow up required: Please call me if you need assistance with future resources  [name], BSW, CDP Social Worker, Certified Dementia Practitioner [place] [facility] [place] [facility] [phone]

## 2019-01-23 ENCOUNTER — Telehealth: Payer: Self-pay

## 2019-01-23 ENCOUNTER — Ambulatory Visit: Payer: Self-pay

## 2019-01-23 ENCOUNTER — Other Ambulatory Visit: Payer: Self-pay

## 2019-01-23 DIAGNOSIS — I10 Essential (primary) hypertension: Secondary | ICD-10-CM

## 2019-01-23 DIAGNOSIS — R7303 Prediabetes: Secondary | ICD-10-CM

## 2019-01-23 DIAGNOSIS — H547 Unspecified visual loss: Secondary | ICD-10-CM

## 2019-01-24 ENCOUNTER — Ambulatory Visit: Payer: Self-pay | Admitting: Pharmacist

## 2019-01-24 DIAGNOSIS — I639 Cerebral infarction, unspecified: Secondary | ICD-10-CM

## 2019-01-24 DIAGNOSIS — I10 Essential (primary) hypertension: Secondary | ICD-10-CM

## 2019-01-24 DIAGNOSIS — I4891 Unspecified atrial fibrillation: Secondary | ICD-10-CM

## 2019-01-24 NOTE — Chronic Care Management (AMB) (Signed)
Chronic Care Management   Follow Up Note   01/23/2019 Name: Kimberly [name] MRN: [id number] DOB: Dec 25, 1955  Referred by: [name], FNP Reason for referral : Care Coordination (CM RNCM Telephone Follow Up )   Kimberly [name] is a 63 y.o. year old female who is a primary care patient of [name], [place]. The CCM team was consulted for assistance with chronic disease management and care coordination needs.    Review of patient status, including review of consultants reports, relevant laboratory and other test results, and collaboration with appropriate care team members and the patient's provider was performed as part of comprehensive patient evaluation and provision of chronic care management services.    I spoke with patient's sister Kimberly [name] by telephone today.    Goals Addressed            This Visit's Progress     Patient Stated   . Chronic Care Management Needs (pt-stated)   On track    Current Barriers:   Ineffective Self Health Maintenance  Knowledge Deficits related to short term plan for care coordination needs and long term plans for chronic disease management needs  Nurse Case Manager Clinical Goal(s):   Over the next 90 days, patient will work with care management team to address care coordination and chronic disease management needs related to Disease Management, Educational Needs, Care Coordination    CCM RN CM Interventions:  01/23/19: Call completed with sister Kimberly [name]:   Assessed for ongoing chronic disease management and education needs  Assessed for community resource needs  Assessed for home safety concerns and or care coordination needs - sister states patient has services in place at this time  Discussed plans with patient for ongoing care management follow up and provided patient with direct contact information for care management team  Patient Self Care Activities:   Verbalizes understanding of the education/information provided today   Currently UNABLE TO independently self manage needs related to chronic health conditions.   Please see past updates related to this goal by clicking on the "Past Updates" button in the selected goal       Other   . She needs help with her medications   On track    Current Barriers:  Kimberly [name] Knowledge Deficits related to Medication Administration and Long Term Management  Nurse Case Manager Clinical Goal(s):  Kimberly [name] Over the next 30 days, patient will work with [facility] to address needs related to education and assistance with medication administration and management.   CCM RN CM Interventions:  01/23/19: Completed call with sister Kimberly [name]   . Assessed for medication adherence; assessed for questions/concerns regarding medication management . Discussed patient may benefit from having a pill packaging system; advised embedded PharmD Lottie Dawson will help coordinate - sister verbalizes understanding and is currently filling patient's pill box and supervising self administration  . Discussed having a pharmacy to deliver patient's medications will help ensure refills are delivered prior to patient running out of her meds . Assessed for questions/concerns regarding patient meds - sister denies . 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 Discussed plans with patient for ongoing care management follow up and provided patient with direct contact information for care management team  Patient Self Care Activities:  . Currently UNABLE TO independently self administer medications; unable to independently  perform ADL's and IADL's   Please see past updates related to this goal by clicking on the "Past Updates" button in the selected goal      . We need a Rx for a BP cuff   Not on track    Current Barriers:  Kimberly [name]  Knowledge Deficits related to treatment management for Hypertension   . Patient does not currently have a BP cuff in her home  Nurse Case Manager Clinical Goal(s):   Over the next 14 days, patient will have a BP cuff in her home for daily BP checks.  . Over the next 30 days, patient will work with provider to address needs related to Hypertension treatment management.   CCM RN CM Interventions:  01/23/19: Completed call with sister Kimberly [name]:   . Discussed patient's adherence with taking BP meds as prescribed  . Assessed for receipt of BP cuff - patient does not currently have in the home . Discussed Rx for BP cuff was faxed into patient's pharmacy to be picked up asap . Discussed knowledge and understanding of target BP 130/80 or less and when to call the CCM team and or provider for abnormal BP's . Discussed plans with patient for ongoing care management follow up and provided patient with direct contact information for care management team  Patient Self Care Activities:   Verbalizes understanding of the education/information provided today . Currently UNABLE TO independently self administer medications; unable to independently perform ADL's and IADL's   Please see past updates related to this goal by clicking on the "Past Updates" button in the selected goal          The care management team will reach out to the patient again over the next 14-21 days.    Barb Merino, RN,CCM Care Management Coordinator Kimberly [name]  Direct Phone: [phone]

## 2019-01-24 NOTE — Patient Instructions (Signed)
Visit Information  Goals Addressed      Patient Stated   . Chronic Care Management Needs (pt-stated)   On track    Current Barriers:   Ineffective Self Health Maintenance  Knowledge Deficits related to short term plan for care coordination needs and long term plans for chronic disease management needs  Nurse Case Manager Clinical Goal(s):   Over the next 90 days, patient will work with care management team to address care coordination and chronic disease management needs related to Disease Management, Educational Needs, Care Coordination    CCM RN CM Interventions:  01/23/19: Call completed with sister [name]:   Assessed for ongoing chronic disease management and education needs  Assessed for community resource needs  Assessed for home safety concerns and or care coordination needs - sister states patient has services in place at this time  Discussed plans with patient for ongoing care management follow up and provided patient with direct contact information for care management team  Patient Self Care Activities:   Verbalizes understanding of the education/information provided today  Currently UNABLE TO independently self manage needs related to chronic health conditions.   Please see past updates related to this goal by clicking on the "Past Updates" button in the selected goal       Other   . She needs help with her medications   On track    Current Barriers:  [name] Knowledge Deficits related to Medication Administration and Long Term Management  Nurse Case Manager Clinical Goal(s):  [name] Over the next 30 days, patient will work with [facility] to address needs related to education and assistance with medication administration and management.   CCM RN CM Interventions:  01/23/19: Completed call with sister [name]   . Assessed for medication adherence; assessed for questions/concerns regarding medication management . Discussed patient may benefit from  having a pill packaging system; advised embedded PharmD Lottie Dawson will help coordinate - sister verbalizes understanding and is currently filling patient's pill box and supervising self administration  . Discussed having a pharmacy to deliver patient's medications will help ensure refills are delivered prior to patient running out of her meds . Assessed for questions/concerns regarding patient meds - sister denies . 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 Discussed plans with patient for ongoing care management follow up and provided patient with direct contact information for care management team  Patient Self Care Activities:  . Currently UNABLE TO independently self administer medications; unable to independently perform ADL's and IADL's   Please see past updates related to this goal by clicking on the "Past Updates" button in the selected goal     . We need a Rx for a BP cuff   Not on track    Current Barriers:  [name] Knowledge Deficits related to treatment management for Hypertension   . Patient does not currently have a BP cuff in her home  Nurse Case Manager Clinical Goal(s):   Over the next 14 days, patient will have a BP cuff in her home for daily BP checks.  . Over the next 30 days, patient will work with provider to address needs related to Hypertension treatment management.   CCM RN CM Interventions:  01/23/19: Completed call with sister [name]:   . Discussed patient's adherence with taking BP meds as  prescribed  . Assessed for receipt of BP cuff - patient does not currently have in the home . Discussed Rx for BP cuff was faxed into patient's pharmacy to be picked up asap . Discussed knowledge and understanding of target BP 130/80 or less and when to call the CCM team and or provider for abnormal  BP's . Discussed plans with patient for ongoing care management follow up and provided patient with direct contact information for care management team  Patient Self Care Activities:   Verbalizes understanding of the education/information provided today . Currently UNABLE TO independently self administer medications; unable to independently perform ADL's and IADL's   Please see past updates related to this goal by clicking on the "Past Updates" button in the selected goal          The patient verbalized understanding of instructions provided today and declined a print copy of patient instruction materials.   The care management team will reach out to the patient again over the next 14-21 days.   Barb Merino, RN,CCM Care Management Coordinator [facility]  Direct Phone: [phone]

## 2019-01-27 NOTE — Patient Instructions (Signed)
Visit Information  Goals Addressed            This Visit's Progress     Patient Stated   . I need help with organizing my medications (pt-stated)       Current Barriers:  [name] Knowledge Deficits related to Medication Administration/Management  CCM PharmD Clinical Goal(s):  [name] Over the next 30 days, patient will work with PharmD and PCP to address needs related to optimized medication management of chronic conditions.  CCM PharmD Interventions:  01/24/19: Completed call with patient   . Assessed for medication adherence; assessed for questions/concerns regarding medication management . Discussed pill packaging system with patient.  Patient stated she was interested, however she stated her sister organizes her medications in a pill box.  Patient recommended I call sister to discuss pill packaging.  Message left with sister [name]) encouraging call back.  Patient interested in having pill packs delivered once set up.  Will attempt to use [place] or [place] as they offer free pill packaging and delivery. . Collaboration with embedded CCM RN Glenard Haring Little regarding sister's work schedule/availability and discussed patient case.  . Discussed plans with patient for ongoing care management follow up and provided patient with direct contact information for care management team . Will contact sister again next week if call not returned sooner.   Patient Self Care Activities:  . Currently UNABLE TO independently self administer medications; unable to independently perform ADL's and IADL's   Please see past updates related to this goal by clicking on the "Past Updates" button in the selected goal          The patient verbalized understanding of instructions provided today and declined a print copy of patient instruction materials.   The care management team will reach out to the patient again over the next 5-7 business  days.   Regina Eck, PharmD,  BCPS Clinical Pharmacist, [facility] [place]: [phone]

## 2019-01-27 NOTE — Progress Notes (Signed)
  Chronic Care Management   Initial Visit Note  01/24/2019 Name: Kimberly [name] MRN: [id number] DOB: Feb 04, 1956  Referred by: [name], FNP Reason for referral : Chronic Care Management   Kimberly [name] is a 63 y.o. year old female who is a primary care patient of [name], [place]. The CCM team was consulted for assistance with chronic disease management and care coordination needs.   Review of patient status, including review of consultants reports, relevant laboratory and other test results, and collaboration with appropriate care team members and the patient's provider was performed as part of comprehensive patient evaluation and provision of chronic care management services.    I spoke with [name] by telephone today.  Objective:   Goals Addressed            This Visit's Progress     Patient Stated   . I need help with organizing my medications (pt-stated)       Current Barriers:  [name] Knowledge Deficits related to Medication Administration/Management  CCM PharmD Clinical Goal(s):  [name] Over the next 30 days, patient will work with PharmD and PCP to address needs related to optimized medication management of chronic conditions.  CCM PharmD Interventions:  01/24/19: Completed call with patient   . Assessed for medication adherence; assessed for questions/concerns regarding medication management . Discussed pill packaging system with patient.  Patient stated she was interested, however she stated her sister organizes her medications in a pill box.  Patient recommended I call sister to discuss pill packaging.  Message left with sister [name]) encouraging call back.  Patient interested in having pill packs delivered once set up.  Will attempt to use [place] or [place] as they offer free pill packaging and delivery. . Collaboration with embedded CCM RN Glenard Haring Little regarding sister's work schedule/availability and discussed patient case.   . Discussed plans with patient for ongoing care management follow up and provided patient with direct contact information for care management team . Will contact sister again next week if call not returned sooner.   Patient Self Care Activities:  . Currently UNABLE TO independently self administer medications; unable to independently perform ADL's and IADL's   Please see past updates related to this goal by clicking on the "Past Updates" button in the selected goal          Plan:   The care management team will reach out to the patient again over the next 5-7 business days.   Regina Eck, PharmD, BCPS Clinical Pharmacist, [facility] [place]: [phone]

## 2019-01-30 ENCOUNTER — Telehealth: Payer: Self-pay

## 2019-02-03 ENCOUNTER — Telehealth: Payer: Self-pay

## 2019-02-04 ENCOUNTER — Ambulatory Visit: Payer: Self-pay | Admitting: Pharmacist

## 2019-02-04 DIAGNOSIS — I639 Cerebral infarction, unspecified: Secondary | ICD-10-CM

## 2019-02-04 DIAGNOSIS — I4891 Unspecified atrial fibrillation: Secondary | ICD-10-CM

## 2019-02-04 DIAGNOSIS — I10 Essential (primary) hypertension: Secondary | ICD-10-CM

## 2019-02-05 ENCOUNTER — Other Ambulatory Visit: Payer: Self-pay

## 2019-02-05 MED ORDER — APIXABAN 5 MG PO TABS: 5.0000 mg | ORAL_TABLET | Freq: Two times a day (BID) | ORAL | 3 refills | Status: DC

## 2019-02-05 MED ORDER — GABAPENTIN 100 MG PO CAPS: ORAL_CAPSULE | ORAL | 1 refills | Status: DC

## 2019-02-05 MED ORDER — ASPIRIN EC 81 MG PO TBEC: 81.0000 mg | DELAYED_RELEASE_TABLET | Freq: Every day | ORAL | 1 refills | Status: DC

## 2019-02-05 MED ORDER — MITIGARE 0.6 MG PO CAPS: 1.0000 | ORAL_CAPSULE | Freq: Two times a day (BID) | ORAL | 1 refills | Status: DC

## 2019-02-05 MED ORDER — ATORVASTATIN CALCIUM 80 MG PO TABS: 80.0000 mg | ORAL_TABLET | Freq: Every day | ORAL | 1 refills | Status: DC

## 2019-02-05 MED ORDER — OMEPRAZOLE 20 MG PO CPDR: 20.0000 mg | DELAYED_RELEASE_CAPSULE | Freq: Every day | ORAL | 1 refills | Status: DC

## 2019-02-05 NOTE — Progress Notes (Signed)
  Chronic Care Management   Visit Note  02/04/2019 Name: Kimberly [name] MRN: [id number] DOB: 10-22-1955  Referred by: Kimberly Brine, FNP Reason for referral : Chronic Care Management   Kimberly [name] is a 63 y.o. year old female who is a primary care patient of Kimberly [name], Kimberly [name]. The CCM team was consulted for assistance with chronic disease management and care coordination needs.   Review of patient status, including review of consultants reports, relevant laboratory and other test results, and collaboration with appropriate care team members and the patient's provider was performed as part of comprehensive patient evaluation and provision of chronic care management services.    I spoke with [name]' sister Kimberly [name]) by telephone today.  Objective:   Goals Addressed            This Visit's Progress     Patient Stated   . I need help with organizing my medications (pt-stated)       Current Barriers:  [name] Knowledge Deficits related to Medication Administration/Management  CCM PharmD Clinical Goal(s):  [name] Over the next 30 days, patient will work with PharmD and PCP to address needs related to optimized medication management of chronic conditions.  CCM PharmD Interventions:  02/04/19: Completed call with patient's sister Kimberly [name])  . Assessed for medication adherence; assessed for questions/concerns regarding medication management . Discussed pill packaging system with patient.  Patient & sister agreeable to initiate pill packaging. . Call placed to [facility] [address]-free packaging and delivery) to set up patient with pill packaging.  All new Rxs were sent to pharmacy to begin process.  Will alert family when medications will be packaged and delivered. . Discussed plans with patient for ongoing care management follow up and provided patient with direct contact information for care management team . Will continue to follow  Patient Self Care Activities:  .  Currently UNABLE TO independently self administer medications; unable to independently perform ADL's and IADL's   Please see past updates related to this goal by clicking on the "Past Updates" button in the selected goal           Plan:   The care management team will reach out to the patient again over the next 3-5 business days.   Regina Eck, PharmD, BCPS Clinical Pharmacist, [facility] [place]: [phone]

## 2019-02-05 NOTE — Patient Instructions (Signed)
Visit Information  Goals Addressed            This Visit's Progress     Patient Stated   . I need help with organizing my medications (pt-stated)       Current Barriers:  [name] Knowledge Deficits related to Medication Administration/Management  CCM PharmD Clinical Goal(s):  [name] Over the next 30 days, patient will work with PharmD and PCP to address needs related to optimized medication management of chronic conditions.  CCM PharmD Interventions:  02/04/19: Completed call with patient's sister [name])  . Assessed for medication adherence; assessed for questions/concerns regarding medication management . Discussed pill packaging system with patient.  Patient & sister agreeable to initiate pill packaging. . Call placed to [facility]-free packaging and delivery) to set up patient with pill packaging.  All new Rxs were sent to pharmacy to begin process.  Will alert family when medications will be packaged and delivered. . Discussed plans with patient for ongoing care management follow up and provided patient with direct contact information for care management team . Will continue to follow  Patient Self Care Activities:  . Currently UNABLE TO independently self administer medications; unable to independently perform ADL's and IADL's   Please see past updates related to this goal by clicking on the "Past Updates" button in the selected goal          The patient verbalized understanding of instructions provided today and declined a print copy of patient instruction materials.   The care management team will reach out to the patient again over the next 3-5 business days.  Regina Eck, PharmD, BCPS Clinical Pharmacist, [facility] [place]: [phone]

## 2019-02-06 ENCOUNTER — Other Ambulatory Visit: Payer: Self-pay

## 2019-02-06 MED ORDER — GABAPENTIN 100 MG PO CAPS: ORAL_CAPSULE | ORAL | 1 refills | Status: DC

## 2019-02-07 ENCOUNTER — Telehealth: Payer: Self-pay

## 2019-02-12 ENCOUNTER — Ambulatory Visit: Payer: Self-pay | Admitting: Pharmacist

## 2019-02-12 DIAGNOSIS — I639 Cerebral infarction, unspecified: Secondary | ICD-10-CM

## 2019-02-12 DIAGNOSIS — I10 Essential (primary) hypertension: Secondary | ICD-10-CM

## 2019-02-12 DIAGNOSIS — I4891 Unspecified atrial fibrillation: Secondary | ICD-10-CM

## 2019-02-12 NOTE — Patient Instructions (Signed)
Visit Information  Goals Addressed            This Visit's Progress     Patient Stated   . I need help organizing my medications (pt-stated)       Current Barriers:  [name] Knowledge Deficits related to Medication Administration/Management  CCM PharmD Clinical Goal(s):  [name] Over the next 30 days, patient will work with PharmD and PCP to address needs related to optimized medication management of chronic conditions.  CCM PharmD Interventions:  02/12/19: Completed call with patient's sister [name])  . Assessed for medication adherence; assessed for questions/concerns regarding medication management . Discussed pill packaging system with patient.  Patient & sister agreeable to initiate pill packaging. . Call placed to [facility]-free packaging and delivery) to set up patient with pill packaging.  All new Rxs were sent to pharmacy to begin process.  Sister verbalizes understanding and in agreement with plan.  Reviewed the following information:  Based on insurance and fill history the following packaging will take place: o 02/21/19--Eliquis, ASA, mitigare, gabapentin o 04/02/19--atorvastatin added to the above o 04/23/19--omeprazole added to complete full medication list . Discussed plans with patient for ongoing care management follow up and provided patient with direct contact information for care management team . Will continue to follow  Patient Self Care Activities:  . Currently UNABLE TO independently self administer medications; unable to independently perform ADL's and IADL's   Please see past updates related to this goal by clicking on the "Past Updates" button in the selected goal          The patient verbalized understanding of instructions provided today and declined a print copy of patient instruction materials.   The care management team will reach out to the patient again over the next 2 weeks.  Regina Eck, PharmD, BCPS Clinical Pharmacist,  [facility] [place]: [phone]

## 2019-02-12 NOTE — Progress Notes (Signed)
  Chronic Care Management   Visit Note  02/12/2019 Name: Kimberly [name] MRN: [id number] DOB: 01-17-1956  Referred by: [name], FNP Reason for referral : Chronic Care Management   Kimberly [name] is a 63 y.o. year old female who is a primary care patient of [name], [place]. The CCM team was consulted for assistance with chronic disease management and care coordination needs.   Review of patient status, including review of consultants reports, relevant laboratory and other test results, and collaboration with appropriate care team members and the patient's provider was performed as part of comprehensive patient evaluation and provision of chronic care management services.    I spoke with patient's sister, Kimberly [name] by telephone today.  Objective:   Goals Addressed            This Visit's Progress     Patient Stated   . I need help organizing my medications (pt-stated)       Current Barriers:  [name] Knowledge Deficits related to Medication Administration/Management  CCM PharmD Clinical Goal(s):  [name] Over the next 30 days, patient will work with PharmD and PCP to address needs related to optimized medication management of chronic conditions.  CCM PharmD Interventions:  02/12/19: Completed call with patient's sister Kimberly [name])  . Assessed for medication adherence; assessed for questions/concerns regarding medication management . Discussed pill packaging system with patient.  Patient & sister agreeable to initiate pill packaging. . Call placed to [facility]-free packaging and delivery) to set up patient with pill packaging.  All new Rxs were sent to pharmacy to begin process.  Sister verbalizes understanding and in agreement with plan.  Reviewed the following information:  Based on insurance and fill history the following packaging will take place: o 02/21/19--Eliquis, ASA, mitigare, gabapentin o 04/02/19--atorvastatin added to the above o 04/23/19--omeprazole added to  complete full medication list . Discussed plans with patient for ongoing care management follow up and provided patient with direct contact information for care management team . Will continue to follow  Patient Self Care Activities:  . Currently UNABLE TO independently self administer medications; unable to independently perform ADL's and IADL's   Please see past updates related to this goal by clicking on the "Past Updates" button in the selected goal           Plan:   The care management team will reach out to the patient again over the next 2 weeks.  Regina Eck, PharmD, BCPS Clinical Pharmacist, [facility] [place]: [phone]

## 2019-02-13 ENCOUNTER — Telehealth: Payer: Self-pay

## 2019-02-18 ENCOUNTER — Telehealth: Payer: Self-pay

## 2019-02-21 ENCOUNTER — Ambulatory Visit: Payer: Self-pay | Admitting: Pharmacist

## 2019-02-21 DIAGNOSIS — I4891 Unspecified atrial fibrillation: Secondary | ICD-10-CM

## 2019-02-21 DIAGNOSIS — R7303 Prediabetes: Secondary | ICD-10-CM

## 2019-02-21 DIAGNOSIS — I10 Essential (primary) hypertension: Secondary | ICD-10-CM

## 2019-02-22 ENCOUNTER — Other Ambulatory Visit: Payer: Self-pay | Admitting: Nurse Practitioner

## 2019-02-25 NOTE — Patient Instructions (Signed)
Visit Information  Goals Addressed            This Visit's Progress     Patient Stated   . I need help organizing my medications (pt-stated)       Current Barriers:  [name] Knowledge Deficits related to Medication Administration/Management  CCM PharmD Clinical Goal(s):  [name] Over the next 90 days, patient will work with PharmD and PCP to address needs related to optimized medication management of chronic conditions.  CCM PharmD Interventions:  02/21/19: Voicemail left with patient's sister [name]) 02/21/19: Call placed to [place] to schedule pill pack delivery  . Assessed for medication adherence; assessed for questions/concerns regarding medication management . Discussed pill packaging system with patient.  Patient & sister agreeable to initiate pill packaging. . [facility] to [facility]-free packaging and delivery) to set up patient with pill packaging.  All new Rxs were sent to pharmacy to begin process.  Sister verbalizes understanding and in agreement with plan.  Reviewed the following information:  Based on insurance and fill history the following packaging will take place: o 02/21/19--Eliquis, ASA, mitigare, gabapentin o 04/02/19--atorvastatin added to the above o 04/23/19--omeprazole added to complete full medication list . Discussed plans with patient for ongoing care management follow up and provided patient with direct contact information for care management team . Will continue to follow  Patient Self Care Activities:  . Currently UNABLE TO independently self administer medications; unable to independently perform ADL's and IADL's   Please see past updates related to this goal by clicking on the "Past Updates" button in the selected goal          The patient verbalized understanding of instructions provided today and declined a print copy of patient instruction materials.   The care management team will reach out to the patient again over the  next week.   Regina Eck, PharmD, BCPS Clinical Pharmacist, [facility] [place]: [phone]

## 2019-02-25 NOTE — Progress Notes (Signed)
  Chronic Care Management   Visit Note  02/21/2019 Name: Kimberly [name] MRN: [id number] DOB: 02-28-1956  Referred by: [name], FNP Reason for referral : Chronic Care Management   Kimberly [name] is a 63 y.o. year old female who is a primary care patient of [name], [place]. The CCM team was consulted for assistance with chronic disease management and care coordination needs.   Review of patient status, including review of consultants reports, relevant laboratory and other test results, and collaboration with appropriate care team members and the patient's provider was performed as part of comprehensive patient evaluation and provision of chronic care management services.     Objective:   Goals Addressed            This Visit's Progress     Patient Stated   . I need help organizing my medications (pt-stated)       Current Barriers:  [name] Knowledge Deficits related to Medication Administration/Management  CCM PharmD Clinical Goal(s):  [name] Over the next 90 days, patient will work with PharmD and PCP to address needs related to optimized medication management of chronic conditions.  CCM PharmD Interventions:  02/21/19: Voicemail left with patient's sister [name]) 02/21/19: Call placed to [place] to schedule pill pack delivery  . Assessed for medication adherence; assessed for questions/concerns regarding medication management . Discussed pill packaging system with patient.  Patient & sister agreeable to initiate pill packaging. . Call placed to [facility]-free packaging and delivery) to set up patient with pill packaging.  All new Rxs were sent to pharmacy to begin process.  Sister verbalizes understanding and in agreement with plan.  Reviewed the following information:  Based on insurance and fill history the following packaging will take place: o 02/21/19--Eliquis, ASA, mitigare, gabapentin o 04/02/19--atorvastatin added to the above o  04/23/19--omeprazole added to complete full medication list . Discussed plans with patient for ongoing care management follow up and provided patient with direct contact information for care management team . Will continue to follow  Patient Self Care Activities:  . Currently UNABLE TO independently self administer medications; unable to independently perform ADL's and IADL's   Please see past updates related to this goal by clicking on the "Past Updates" button in the selected goal           Plan:   The care management team will reach out to the patient again over the next week.  Regina Eck, PharmD, BCPS Clinical Pharmacist, [facility] [place]: [phone]

## 2019-02-28 ENCOUNTER — Telehealth: Payer: Self-pay

## 2019-03-21 ENCOUNTER — Telehealth: Payer: Self-pay

## 2019-03-25 ENCOUNTER — Telehealth: Payer: Self-pay

## 2019-03-25 NOTE — Telephone Encounter (Signed)
Patient's sister [name] called stating pt needs a prescription for gloves and wipes she is supposed to call me back and leave me a v/m with the info of the place we are sending the prescription for. YRL,RMA

## 2019-03-28 ENCOUNTER — Ambulatory Visit: Payer: Self-pay | Admitting: Pharmacist

## 2019-03-28 DIAGNOSIS — I4891 Unspecified atrial fibrillation: Secondary | ICD-10-CM

## 2019-03-28 DIAGNOSIS — R7303 Prediabetes: Secondary | ICD-10-CM

## 2019-03-28 DIAGNOSIS — I10 Essential (primary) hypertension: Secondary | ICD-10-CM

## 2019-04-01 NOTE — Progress Notes (Signed)
Chronic Care Management   Visit Note  03/28/2019 Name: Kimberly [name] MRN: [id number] DOB: 1956/01/20  Referred by: [name], FNP Reason for referral : Chronic Care Management   Kimberly [name] is a 63 y.o. year old female who is a primary care patient of [name], [place]. The CCM team was consulted for assistance with chronic disease management and care coordination needs.   Review of patient status, including review of consultants reports, relevant laboratory and other test results, and collaboration with appropriate care team members and the patient's provider was performed as part of comprehensive patient evaluation and provision of chronic care management services.    I spoke with patient's sister, [name], by telephone today.  Medications: Outpatient Encounter Medications as of 03/28/2019  Medication Sig  . acetaminophen (TYLENOL) 500 MG tablet Take 1,000 mg by mouth every 6 (six) hours as needed for moderate pain.  [name] apixaban (ELIQUIS) 5 MG TABS tablet Take 1 tablet (5 mg total) by mouth 2 (two) times daily.  [name] aspirin EC 81 MG tablet Take 1 tablet (81 mg total) by mouth daily.  [name] atorvastatin (LIPITOR) 80 MG tablet Take 1 tablet (80 mg total) by mouth daily.  [name] gabapentin (NEURONTIN) 100 MG capsule TAKE ONE CAPSULE 3 TIMES DAILY  . MITIGARE 0.6 MG CAPS TAKE 1 CAPSULE BY MOUTH TWICE DAILY  . omeprazole (PRILOSEC) 20 MG capsule Take 1 capsule (20 mg total) by mouth daily.      Objective:   Goals Addressed            This Visit's Progress     Patient Stated   . I need help organizing my medications (pt-stated)       Current Barriers:  [name] Knowledge Deficits related to Medication Administration/Management  CCM PharmD Clinical Goal(s):  [name] Over the next 90 days, patient will work with PharmD and PCP to address needs related to optimized medication management of chronic conditions.  CCM PharmD Interventions:  03/28/19: Incoming call from patient's sister [name])  03/28/19: Call placed to [facility] to schedule pill pack delivery  . Assessed for medication adherence; assessed for questions/concerns regarding medication management . Discussed pill packaging system with patient.  Patient & sister state they have enjoyed pill packaging system from [place] and would like to continue it.  Patient has remained compliant with system and it has also made it easier for nurse aid to administer medications in the AM.  . Call placed to [facility] [address]-free packaging and delivery) to set up patient with pill packaging.  All new Rxs were sent to pharmacy to begin process.  Sister verbalizes understanding and in agreement with plan.  Reviewed the following information:  Based on insurance and fill history the following packaging will take place: o 02/21/19--Eliquis, ASA, mitigare, gabapentin o 04/02/19--atorvastatin added to the above o 04/23/19--omeprazole added to complete full medication list . Discussed plans with patient for ongoing care management follow up and provided patient with direct contact information for care management team . Patient and sister deny side effects from current medication list. . Will continue to follow  Patient Self Care Activities:  . Currently UNABLE TO independently self administer medications; unable to independently perform ADL's and IADL's   Please see past updates related to this goal by clicking on the "Past Updates" button in the selected goal           Plan:   The care management team will reach out to the patient again over the  next 4-6 weeks.  Regina Eck, PharmD, BCPS Clinical Pharmacist, [facility] [place]: [phone]

## 2019-04-01 NOTE — Patient Instructions (Signed)
Visit Information  Goals Addressed            This Visit's Progress     Patient Stated   . I need help organizing my medications (pt-stated)       Current Barriers:  [name] Knowledge Deficits related to Medication Administration/Management  CCM PharmD Clinical Goal(s):  [name] Over the next 90 days, patient will work with PharmD and PCP to address needs related to optimized medication management of chronic conditions.  CCM PharmD Interventions:  03/28/19: Incoming call from patient's sister [name]) 03/28/19: Call placed to [facility] to schedule pill pack delivery  . Assessed for medication adherence; assessed for questions/concerns regarding medication management . Discussed pill packaging system with patient.  Patient & sister state they have enjoyed pill packaging system from [place] and would like to continue it.  Patient has remained compliant with system and it has also made it easier for nurse aid to administer medications in the AM.  . Call placed to [facility]-free packaging and delivery) to set up patient with pill packaging.  All new Rxs were sent to pharmacy to begin process.  Sister verbalizes understanding and in agreement with plan.  Reviewed the following information:  Based on insurance and fill history the following packaging will take place: o 02/21/19--Eliquis, ASA, mitigare, gabapentin o 04/02/19--atorvastatin added to the above o 04/23/19--omeprazole added to complete full medication list . Discussed plans with patient for ongoing care management follow up and provided patient with direct contact information for care management team . Patient and sister deny side effects from current medication list. . Will continue to follow  Patient Self Care Activities:  . Currently UNABLE TO independently self administer medications; unable to independently perform ADL's and IADL's   Please see past updates related to this goal by clicking on the  "Past Updates" button in the selected goal          The patient verbalized understanding of instructions provided today and declined a print copy of patient instruction materials.   The care management team will reach out to the patient again over the next 4-6  weeks.  Regina Eck, PharmD, BCPS Clinical Pharmacist, [facility] [place]: [phone]

## 2019-04-02 ENCOUNTER — Encounter: Payer: Self-pay | Admitting: Nurse Practitioner

## 2019-04-02 ENCOUNTER — Ambulatory Visit (INDEPENDENT_AMBULATORY_CARE_PROVIDER_SITE_OTHER): Payer: Medicaid Other | Admitting: Nurse Practitioner

## 2019-04-02 ENCOUNTER — Other Ambulatory Visit: Payer: Self-pay

## 2019-04-02 VITALS — Ht 67.0 in | Wt 131.0 lb

## 2019-04-02 DIAGNOSIS — Z20828 Contact with and (suspected) exposure to other viral communicable diseases: Secondary | ICD-10-CM

## 2019-04-02 DIAGNOSIS — R110 Nausea: Secondary | ICD-10-CM

## 2019-04-02 DIAGNOSIS — R05 Cough: Secondary | ICD-10-CM

## 2019-04-02 DIAGNOSIS — R32 Unspecified urinary incontinence: Secondary | ICD-10-CM | POA: Diagnosis not present

## 2019-04-02 DIAGNOSIS — R059 Cough, unspecified: Secondary | ICD-10-CM

## 2019-04-02 DIAGNOSIS — R350 Frequency of micturition: Secondary | ICD-10-CM | POA: Diagnosis not present

## 2019-04-02 MED ORDER — AZITHROMYCIN 250 MG PO TABS: ORAL_TABLET | ORAL | 0 refills | Status: AC

## 2019-04-02 NOTE — Progress Notes (Addendum)
Virtual Visit via Telephone   This visit type was conducted due to national recommendations for restrictions regarding the COVID-19 Pandemic (e.g. social distancing) in an effort to limit this patient's exposure and mitigate transmission in our community.  Due to her co-morbid illnesses, this patient is at least at moderate risk for complications without adequate follow up.  This format is felt to be most appropriate for this patient at this time.  All issues noted in this document were discussed and addressed.  A limited physical exam was performed with this format.    This visit type was conducted due to national recommendations for restrictions regarding the COVID-19 Pandemic (e.g. social distancing) in an effort to limit this patient's exposure and mitigate transmission in our community.  Patients identity confirmed using two different identifiers.  This format is felt to be most appropriate for this patient at this time.  All issues noted in this document were discussed and addressed.  No physical exam was performed (except for noted visual exam findings with Video Visits).    Date:  04/11/2019   ID:  [name], [name] 02-Jun-1956, MRN [id number]  Patient Location:  Home - spoke with Gunnar Fusi   Provider location:   Office    Chief Complaint:  Cold symptoms  History of Present Illness:    Kimberly [name] is a 63 y.o. female who presents via video conferencing for a telehealth visit today.    The patient does have symptoms concerning for COVID-19 infection of a cough (fever, chills, cough, or new shortness of breath).   Runny nose at times.  No medications.  Denies fever.  Mild nausea.  She has a caregiver who comes to her home and sometimes wears a mask and sometimes does not.   Cough This is a new problem. The current episode started in the past 7 days. Cough characteristics: clear phlegm  Pertinent negatives include no chest pain, chills, headaches, nasal congestion,  rhinorrhea or sore throat. There is no history of asthma or COPD.  Urinary Frequency  This is a new problem. The problem occurs intermittently (she is waking up 6-7 times a night to urinate. ). The problem has been gradually worsening. There has been no fever. She is not sexually active. Associated symptoms include frequency. Pertinent negatives include no chills. She has tried nothing for the symptoms. There is no history of catheterization.     Past Medical History:  Diagnosis Date  . Bicipital tenosynovitis   . Coronary artery disease 05/2007   [id number]   . Fall 02/13/2018  . Family history of ischemic heart disease   . Gout   . Hammer toe   . Hip, thigh, leg, and ankle, insect bite, nonvenomous, without mention of infection(916.4)   . Hyperlipidemia   . Hypertension   . Other abnormality of red blood cells   . Rotator cuff syndrome of left shoulder   . Stroke [facility])    Past Surgical History:  Procedure Laterality Date  . CARDIAC CATHETERIZATION    . CORONARY ARTERY BYPASS GRAFT     triple  . CORONARY ARTERY BYPASS GRAFT  2008  . TUBAL LIGATION       Current Meds  Medication Sig  . acetaminophen (TYLENOL) 500 MG tablet Take 1,000 mg by mouth every 6 (six) hours as needed for moderate pain.  [name] apixaban (ELIQUIS) 5 MG TABS tablet Take 1 tablet (5 mg total) by mouth 2 (two) times daily.  [name] aspirin EC 81 MG tablet Take  1 tablet (81 mg total) by mouth daily.  [name] atorvastatin (LIPITOR) 80 MG tablet Take 1 tablet (80 mg total) by mouth daily.  [name] gabapentin (NEURONTIN) 100 MG capsule TAKE ONE CAPSULE 3 TIMES DAILY (Patient taking differently: TAKE ONE CAPSULE DAILY)  . MITIGARE 0.6 MG CAPS TAKE 1 CAPSULE BY MOUTH TWICE DAILY  . omeprazole (PRILOSEC) 20 MG capsule Take 1 capsule (20 mg total) by mouth daily.     Allergies:   Clonidine derivatives and Spironolactone   Social History   Tobacco Use  . Smoking status: Light Tobacco Smoker    Types: Cigarettes  . Smokeless tobacco:  Former Systems developer    Quit date: 08/09/2008  Substance Use Topics  . Alcohol use: Yes    Comment: ocassionally wine  . Drug use: Yes    Types: Marijuana    Comment: history of cocaine use     Family Hx: The patient's family history includes Anxiety disorder in her sister; Diabetes in her father; Heart attack in her father; Hypertension in her mother; Kidney failure in her father; Sarcoidosis in her sister.  ROS:   Please see the history of present illness.    Review of Systems  Constitutional: Negative for chills.  HENT: Negative for rhinorrhea and sore throat.   Respiratory: Positive for cough.   Cardiovascular: Negative for chest pain and palpitations.  Genitourinary: Positive for frequency.  Neurological: Negative for dizziness, tingling and headaches.    All other systems reviewed and are negative.   Labs/Other Tests and Data Reviewed:    Recent Labs: 11/28/2018: ALT 20; BUN 12; Creatinine, Ser 1.00; Hemoglobin 10.1; Platelets 171; Potassium 3.6; Sodium 138   Recent Lipid Panel Lab Results  Component Value Date/Time   CHOL 173 07/24/2018 11:58 AM   TRIG 110 07/24/2018 11:58 AM   HDL 58 07/24/2018 11:58 AM   CHOLHDL 3.0 07/24/2018 11:58 AM   CHOLHDL 3.6 02/14/2018 03:14 AM   LDLCALC 93 07/24/2018 11:58 AM    Wt Readings from Last 3 Encounters:  04/02/19 131 lb (59.4 kg)  01/14/19 131 lb (59.4 kg)  11/28/18 111 lb 8 oz (50.6 kg)     Exam:    Vital Signs:  Ht 5\' 7"  (1.702 m)   Wt 131 lb (59.4 kg)   BMI 20.52 kg/m     Physical Exam  Constitutional: She is oriented to person, place, and time. No distress.  Neurological: She is alert and oriented to person, place, and time.  Psychiatric: Mood, memory, affect and judgment normal.  Unable to visualize due to telephone visit  ASSESSMENT & PLAN:    1. Cough  New onset cough, will treat with azithromycin  No known exposure to coronavirus however she does have a home health aid who comes into the home  I have  asked her sister to take her to [facility] to be tested for coronavirus - azithromycin (ZITHROMAX) 250 MG tablet; Take 2 tablets (500 mg) on  Day 1,  followed by 1 tablet (250 mg) once daily on Days 2 through 5.  Dispense: 6 each; Refill: 0 - Novel Coronavirus, NAA ([facility])  2. Urinary frequency   her sister is to come get a urine cup to have checked tomorrow  Encouraged to drink cranberry juice and make sure she is drinking adequate amounts of water.   3. Urinary incontinence, unspecified type  She is having multiple incontinence episodes  It is necessary for her to have incontinence supplies   COVID-19 Education: The signs and symptoms  of COVID-19 were discussed with the patient and how to seek care for testing (follow up with PCP or arrange E-visit).  The importance of social distancing was discussed today.  Patient Risk:   After full review of this patients clinical status, I feel that they are at least moderate risk at this time.  Time spent on telephone visit was 11 minutes with the patient Kimberly [name] and her sister.       Medication Adjustments/Labs and Tests Ordered: Current medicines are reviewed at length with the patient today.  Concerns regarding medicines are outlined above.   Tests Ordered: Orders Placed This Encounter  Procedures  . Novel Coronavirus, NAA ([facility])    Medication Changes: Meds ordered this encounter  Medications  . azithromycin (ZITHROMAX) 250 MG tablet    Sig: Take 2 tablets (500 mg) on  Day 1,  followed by 1 tablet (250 mg) once daily on Days 2 through 5.    Dispense:  6 each    Refill:  0    Disposition:  Follow up prn  Signed, [name], FNP

## 2019-04-14 DIAGNOSIS — R05 Cough: Secondary | ICD-10-CM | POA: Insufficient documentation

## 2019-04-14 DIAGNOSIS — R059 Cough, unspecified: Secondary | ICD-10-CM | POA: Insufficient documentation

## 2019-04-14 DIAGNOSIS — R350 Frequency of micturition: Secondary | ICD-10-CM | POA: Insufficient documentation

## 2019-04-17 ENCOUNTER — Telehealth: Payer: Self-pay

## 2019-04-18 ENCOUNTER — Telehealth: Payer: Self-pay

## 2019-05-12 ENCOUNTER — Ambulatory Visit: Payer: Self-pay | Admitting: Pharmacist

## 2019-05-12 DIAGNOSIS — I10 Essential (primary) hypertension: Secondary | ICD-10-CM

## 2019-05-12 DIAGNOSIS — I4891 Unspecified atrial fibrillation: Secondary | ICD-10-CM

## 2019-05-12 NOTE — Progress Notes (Signed)
Chronic Care Management   Visit Note  05/12/2019 Name: Kimberly [name] MRN: [id number] DOB: 1956/05/05  Referred by: [name], FNP Reason for referral : Chronic Care Management   Kimberly [name] is a 63 y.o. year old female who is a primary care patient of [name], [place]. The CCM team was consulted for assistance with chronic disease management and care coordination needs.   Review of patient status, including review of consultants reports, relevant laboratory and other test results, and collaboration with appropriate care team members and the patient's provider was performed as part of comprehensive patient evaluation and provision of chronic care management services.     Advanced Directives Status: N See Care Plan and [facility] application for related entries.   Medications: Outpatient Encounter Medications as of 05/12/2019  Medication Sig  . acetaminophen (TYLENOL) 500 MG tablet Take 1,000 mg by mouth every 6 (six) hours as needed for moderate pain.  [name] apixaban (ELIQUIS) 5 MG TABS tablet Take 1 tablet (5 mg total) by mouth 2 (two) times daily.  [name] aspirin EC 81 MG tablet Take 1 tablet (81 mg total) by mouth daily.  [name] atorvastatin (LIPITOR) 80 MG tablet Take 1 tablet (80 mg total) by mouth daily.  [name] gabapentin (NEURONTIN) 100 MG capsule TAKE ONE CAPSULE 3 TIMES DAILY (Patient taking differently: TAKE ONE CAPSULE DAILY)  . MITIGARE 0.6 MG CAPS TAKE 1 CAPSULE BY MOUTH TWICE DAILY  . omeprazole (PRILOSEC) 20 MG capsule Take 1 capsule (20 mg total) by mouth daily.   No facility-administered encounter medications on file as of 05/12/2019.      Objective:   Goals Addressed            This Visit's Progress     Patient Stated   . COMPLETED: I need help organizing my medications (pt-stated)       Current Barriers:  [name] Knowledge Deficits related to Medication Administration/Management  CCM PharmD Clinical Goal(s):  [name] Over the next 90 days, patient will work with PharmD and  PCP to address needs related to optimized medication management of chronic conditions.  CCM PharmD Interventions:  05/12/19: Call placed to patient/aid  . Assessed for medication adherence; assessed for questions/concerns regarding medication management . Discussed pill packaging system with patient.  Patient & sister state they have enjoyed pill packaging system from [place] and would like to continue it.  Patient has remained compliant with system and it has also made it easier for nurse aid to administer medications in the AM.  . Sister verbalizes understanding and in agreement to continue with pill packaging system during last phone call. . Patient and sister deny side effects from current medication list.   Patient Self Care Activities:  . Currently UNABLE TO independently self administer medications; unable to independently perform ADL's and IADL's   Please see past updates related to this goal by clicking on the "Past Updates" button in the selected goal       . COMPLETED: Transition medications to compliance packaging (pt-stated)       Current Barriers:  [name] Knowledge Deficits related to Medication Administration and Long Term Management  Nurse Case Manager Clinical Goal(s):  [name] Over the next 30 days, patient will work with CCM team and PCP to address needs related to optimized medication management of chronic conditions.  Interventions:  . Patient has been successfully transitioned to pill packaging system with [facility].  Delivery has been set up.  Patient Self Care Activities:  . Currently UNABLE TO  independently self administer medications; unable to independently perform ADL's and IADL's    Please see past updates related to this goal by clicking on the "Past Updates" button in the selected goal             Plan:   No further follow up required by [facility] PharmD: will sign off at this time.  Regina Eck, PharmD, BCPS Clinical  Pharmacist, [facility] [place]: [phone]

## 2019-05-29 ENCOUNTER — Telehealth: Payer: Self-pay

## 2019-07-14 ENCOUNTER — Other Ambulatory Visit: Payer: Self-pay | Admitting: Nurse Practitioner

## 2019-07-21 NOTE — Progress Notes (Signed)
Called patient 3 times but she did not pick up the phone.   History of Present Illness:  Kimberly [name] is a 63 year old right-handed black woman with hypertension, hyperlipidemia, paroxysmal atrial fibrillation, coronary artery disease and prior history of strokes who follows up for CVA.  UPDATE: Current medications:  Eliquis; Lipitor 80mg ; ASA 81mg ; gabapentin 100mg  daily  For neuropathic pain, she was started on gabapentin  HISTORY: Kimberly [name] was admitted to [facility] on 02/13/18 for stroke presenting as dizziness, fall and left hemianopia.  She was previously on aspirin and Plavix.  She was not given tPA due to outside therapeutic window.  She was found to have new atrial fibrillation.  CT of head revealed right acute to subacute PCA infarct.  MRI of brain confirmed large acute right PCA territory infarct.  CTA of head and neck showed bilateral ICA occlusions at origin, right PCA occlusion and basilar artery irregularity but bilateral MCAs patent from collaterals.  Carotid doppler showed bilateral ICA occlusion with possible left ICA fresh clot.  Transcranial doppler showed left ophthalmic artery and right ACA flow reversal consistent with bilateral carotid system occlusion.  2D echo showed EF 60-65%.  LDL was 100.  Hgb A1c was 5.8.  She remained on ASA and Plavix.  Due to large size of stroke, initiation of anticoagulation was postponed until 5 to 7 days post stroke to avoid hemorrhagic conversion.  She was subsequently started on Eliquis.  Plavix subsequently discontinued.  She was started on Lipitor 80mg .    She has residual left sided weakness and pain.  She uses a walker.  She uses a stress ball to strengthen her hand.  Medicaid only approves 3 sessions a year of physical therapy. She smokes cigarettes (about 1 pack per week)   Assessment and Plan:   1.  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  Paroxysmal atrial  fibrillation with RVR 3.  Cerebrovascular disease with occlusion and stenosis 4.  Hypertension 5.  Hyperlipidemia 6.  Coronary artery disease 7.  Tobacco use disorder  Secondary stroke prevention management as per PCP: 1.  Eliquis for secondary stroke prevention and aspirin (CAD) 2.  Lipitor 80 mg daily (LDL goal less than 70) 3.  Blood pressure control 4.  Mediterranean diet 5.  Tobacco cessation counseling 6.  Gabapentin  Dudley Major, DO

## 2019-07-22 ENCOUNTER — Encounter: Payer: Self-pay | Admitting: Neurology

## 2019-07-22 ENCOUNTER — Other Ambulatory Visit: Payer: Self-pay

## 2019-07-22 ENCOUNTER — Telehealth (INDEPENDENT_AMBULATORY_CARE_PROVIDER_SITE_OTHER): Payer: Medicaid Other | Admitting: Neurology

## 2019-08-11 ENCOUNTER — Other Ambulatory Visit: Payer: Self-pay | Admitting: Nurse Practitioner

## 2019-08-25 ENCOUNTER — Telehealth: Payer: Self-pay | Admitting: Cardiology

## 2019-08-25 NOTE — Telephone Encounter (Signed)
Pt calling stating she is requesting PCS, Patient Care Service, from/for [facility]? She states she has used their services in the past. Advised pt that nurse unable to find documentation in chart for [facility] ordering home health for her. Advised her to have [facility] contact [facility] to update on what is needed. Encounter routed to social work to [facility]

## 2019-08-25 NOTE — Telephone Encounter (Signed)
New Message  Pt called and is requesting a copy of PCS for herself and [name]   Please call to verify

## 2019-08-28 NOTE — Telephone Encounter (Signed)
Follow up   Patient's sister is calling to discuss the previous message. Please call.

## 2019-09-02 ENCOUNTER — Telehealth: Payer: Self-pay

## 2019-09-02 ENCOUNTER — Other Ambulatory Visit: Payer: Self-pay

## 2019-09-02 ENCOUNTER — Ambulatory Visit: Payer: Medicaid Other | Admitting: Nurse Practitioner

## 2019-09-02 ENCOUNTER — Encounter: Payer: Self-pay | Admitting: Nurse Practitioner

## 2019-09-02 VITALS — BP 138/80 | HR 78 | Temp 98.5°F

## 2019-09-02 DIAGNOSIS — R42 Dizziness and giddiness: Secondary | ICD-10-CM

## 2019-09-02 DIAGNOSIS — I69359 Hemiplegia and hemiparesis following cerebral infarction affecting unspecified side: Secondary | ICD-10-CM | POA: Diagnosis not present

## 2019-09-02 DIAGNOSIS — R7303 Prediabetes: Secondary | ICD-10-CM

## 2019-09-02 DIAGNOSIS — R059 Cough, unspecified: Secondary | ICD-10-CM

## 2019-09-02 DIAGNOSIS — Z9181 History of falling: Secondary | ICD-10-CM

## 2019-09-02 DIAGNOSIS — I480 Paroxysmal atrial fibrillation: Secondary | ICD-10-CM

## 2019-09-02 DIAGNOSIS — I639 Cerebral infarction, unspecified: Secondary | ICD-10-CM

## 2019-09-02 DIAGNOSIS — R05 Cough: Secondary | ICD-10-CM

## 2019-09-02 DIAGNOSIS — Z20822 Contact with and (suspected) exposure to covid-19: Secondary | ICD-10-CM

## 2019-09-02 DIAGNOSIS — I10 Essential (primary) hypertension: Secondary | ICD-10-CM | POA: Diagnosis not present

## 2019-09-02 DIAGNOSIS — Z741 Need for assistance with personal care: Secondary | ICD-10-CM

## 2019-09-02 MED ORDER — LORATADINE 10 MG PO TABS: 10.0000 mg | ORAL_TABLET | Freq: Every day | ORAL | 2 refills | Status: DC

## 2019-09-02 MED ORDER — BENZONATATE 100 MG PO CAPS: 100.0000 mg | ORAL_CAPSULE | Freq: Four times a day (QID) | ORAL | 0 refills | Status: DC | PRN

## 2019-09-02 NOTE — Progress Notes (Signed)
This visit occurred during the SARS-CoV-2 public health emergency.  Safety protocols were in place, including screening questions prior to the visit, additional usage of staff PPE, and extensive cleaning of exam room while observing appropriate contact time as indicated for disinfecting solutions.  Subjective:     Patient ID: Kimberly [name] , female    DOB: [birth year] , 64 y.o.   MRN: [id number]   Chief Complaint  Patient presents with  . pcs forms    patient needs her forms to be renewed  . Hypertension  . prediabetes    HPI  She is here today to be evaluated for her PCS form.  She lives alone with a caregiver that comes 2.5 hours per day.  She is unable to cook, dress herself, she needs assistance with walking to the bathroom with her walking cane, will become lightheaded.  Her caregiver vacuums for her.  Her sister is trying to get her signed up for the CAP program.    She has fallen couple times in the last 6 months.  Sometimes when she takes her medications she will get light headed.  She has not had a home health nurse to visit to evaluate her medications.  She feels she needs more assistance at home. She is unable to cook, clean and needs assistance with getting dressed    Hypertension This is a chronic problem. The current episode started more than 1 year ago. The problem is controlled. Pertinent negatives include no anxiety or headaches. There are no associated agents to hypertension. Risk factors for coronary artery disease include sedentary lifestyle. There is no history of chronic renal disease.     Past Medical History:  Diagnosis Date  . Bicipital tenosynovitis   . Coronary artery disease 05/2007   [id number]   . Fall 02/13/2018  . Family history of ischemic heart disease   . Gout   . Hammer toe   . Hip, thigh, leg, and ankle, insect bite, nonvenomous, without mention of infection(916.4)   . Hyperlipidemia   . Hypertension   . Other abnormality of red blood cells   .  Rotator cuff syndrome of left shoulder   . Stroke [facility])      Family History  Problem Relation Age of Onset  . Hypertension Mother   . Heart attack Father   . Diabetes Father   . Kidney failure Father   . Anxiety disorder Sister   . Sarcoidosis Sister      Current Outpatient Medications:  .  acetaminophen (TYLENOL) 500 MG tablet, Take 1,000 mg by mouth every 6 (six) hours as needed for moderate pain., Disp: , Rfl:  .  apixaban (ELIQUIS) 5 MG TABS tablet, Take 1 tablet (5 mg total) by mouth 2 (two) times daily., Disp: 180 tablet, Rfl: 3 .  ASPIRIN ADULT LOW STRENGTH 81 MG EC tablet, TAKE 1 TABLET BY MOUTH EVERY DAY, Disp: 90 tablet, Rfl: 1 .  atorvastatin (LIPITOR) 80 MG tablet, TAKE 1 TABLET BY MOUTH DAILY, Disp: 90 tablet, Rfl: 1 .  gabapentin (NEURONTIN) 100 MG capsule, TAKE ONE CAPSULE 3 TIMES DAILY (Patient taking differently: TAKE ONE CAPSULE DAILY), Disp: 270 capsule, Rfl: 1 .  MITIGARE 0.6 MG CAPS, TAKE 1 CAPSULE BY MOUTH 2 TIMES DAILY, Disp: 180 capsule, Rfl: 1 .  omeprazole (PRILOSEC) 20 MG capsule, TAKE 1 CAPSULE BY MOUTH EVERY DAY, Disp: 90 capsule, Rfl: 1   Allergies  Allergen Reactions  . Clonidine Derivatives Other (See Comments)    Patch causes scars  .  Spironolactone Rash     Review of Systems  Constitutional: Negative.   HENT: Positive for rhinorrhea. Negative for congestion.   Respiratory: Negative.  Negative for cough.   Neurological: Positive for weakness. Negative for dizziness and headaches.  Psychiatric/Behavioral: Negative.      Today's Vitals   09/02/19 1037  BP: 138/80  Pulse: 78  Temp: 98.5 F (36.9 C)  TempSrc: Oral  PainSc: 0-No pain   There is no height or weight on file to calculate BMI.   Objective:  Physical Exam Vitals reviewed.  Constitutional:      General: She is not in acute distress.    Appearance: Normal appearance.  HENT:     Right Ear: Tympanic membrane and external ear normal. There is no impacted cerumen.     Left  Ear: Tympanic membrane and external ear normal. There is no impacted cerumen.  Cardiovascular:     Rate and Rhythm: Normal rate and regular rhythm.     Pulses: Normal pulses.     Heart sounds: Normal heart sounds. No murmur.  Pulmonary:     Effort: Pulmonary effort is normal. No respiratory distress.     Breath sounds: Normal breath sounds.  Musculoskeletal:        General: Normal range of motion.     Right lower leg: No edema.     Left lower leg: No edema.  Skin:    Capillary Refill: Capillary refill takes less than 2 seconds.  Neurological:     General: No focal deficit present.     Mental Status: She is alert and oriented to person, place, and time.  Psychiatric:        Mood and Affect: Mood normal.        Behavior: Behavior normal.        Thought Content: Thought content normal.        Judgment: Judgment normal.         Assessment And Plan:     1. 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 Paroxysmal atrial fibrillation (HCC)  Chronic, continue with eliquis  3. Essential hypertension  Chronic, well controlled without medications - CMP14+EGFR  4. Prediabetes  Chronic, stable  Continue to limit intake of sugary foods and drinks - Lipid panel - Hemoglobin A1c  5. Cerebrovascular accident (CVA), unspecified mechanism ([place])  History of CVA   6. Need for assistance with personal care  She is unable to perform her ADL and requires a significant amount of assistance with cooking, cleaning and dressing.  She has had multiple falls in the last 6 months that has not resulted in any injury.  7. Dizziness  Intermittent dizziness, will check metabolic labs and blood levels  8. Coughing  Reports ongoing coughing for the last week  Will check for coronavirus  Encouraged to stay isolated until her results are back - Novel Coronavirus, NAA ([facility]) -  loratadine (CLARITIN) 10 MG tablet; Take 1 tablet (10 mg total) by mouth daily.  Dispense: 30 tablet; Refill: 2 - benzonatate (TESSALON PERLES) 100 MG capsule; Take 1 capsule (100 mg total) by mouth every 6 (six) hours as needed.  Dispense: 30 capsule; Refill: 0   [name], FNP    THE PATIENT IS ENCOURAGED TO PRACTICE SOCIAL DISTANCING DUE TO THE COVID-19 PANDEMIC.

## 2019-09-02 NOTE — Telephone Encounter (Signed)
Spoke with patient's sister. In order for PCS forms to be filled out patient has to have a visit within 90 days of request. Informed [name] that patient needs to make an appointment with Dr. Percival Spanish. [name] to contact patient and then call office back to schedule appointment so that forms can be completed, PCS form is to extend current PCS hours.

## 2019-09-03 LAB — CMP14+EGFR
ALT: 23 IU/L (ref 0–32)
AST: 22 IU/L (ref 0–40)
Albumin/Globulin Ratio: 1.9 (ref 1.2–2.2)
Albumin: 4.6 g/dL (ref 3.8–4.8)
Alkaline Phosphatase: 130 IU/L — ABNORMAL HIGH (ref 39–117)
BUN/Creatinine Ratio: 12 (ref 12–28)
BUN: 10 mg/dL (ref 8–27)
Bilirubin Total: 0.3 mg/dL (ref 0.0–1.2)
CO2: 25 mmol/L (ref 20–29)
Calcium: 9.4 mg/dL (ref 8.7–10.3)
Chloride: 105 mmol/L (ref 96–106)
Creatinine, Ser: 0.86 mg/dL (ref 0.57–1.00)
GFR calc Af Amer: 83 mL/min/{1.73_m2} (ref >59)
GFR calc non Af Amer: 72 mL/min/{1.73_m2} (ref >59)
Globulin, Total: 2.4 g/dL (ref 1.5–4.5)
Glucose: 67 mg/dL (ref 65–99)
Potassium: 3.1 mmol/L — ABNORMAL LOW (ref 3.5–5.2)
Sodium: 145 mmol/L — ABNORMAL HIGH (ref 134–144)
Total Protein: 7 g/dL (ref 6.0–8.5)

## 2019-09-03 LAB — HEMOGLOBIN A1C
Est. average glucose Bld gHb Est-mCnc: 111 mg/dL
Hgb A1c MFr Bld: 5.5 % (ref 4.8–5.6)

## 2019-09-03 LAB — LIPID PANEL
Chol/HDL Ratio: 2.4 ratio (ref 0.0–4.4)
Cholesterol, Total: 114 mg/dL (ref 100–199)
HDL: 47 mg/dL (ref >39)
LDL Chol Calc (NIH): 54 mg/dL (ref 0–99)
Triglycerides: 58 mg/dL (ref 0–149)
VLDL Cholesterol Cal: 13 mg/dL (ref 5–40)

## 2019-09-03 LAB — NOVEL CORONAVIRUS, NAA: SARS-CoV-2, NAA: NOT DETECTED

## 2019-09-05 ENCOUNTER — Ambulatory Visit: Payer: Medicaid Other | Attending: Nurse Practitioner

## 2019-09-05 ENCOUNTER — Other Ambulatory Visit: Payer: Self-pay

## 2019-09-08 ENCOUNTER — Encounter: Payer: Self-pay | Admitting: Neurology

## 2019-09-09 NOTE — Progress Notes (Signed)
Due to the COVID-19 crisis, this virtual check-in visit was done via telephone from my office and it was initiated and consent given by this patient and or family.   Telephone (Audio) Visit The purpose of this telephone visit is to provide medical care while limiting exposure to the novel coronavirus.    Consent was obtained for telephone visit and initiated by pt/family:  Yes.   Answered questions that patient had about telehealth interaction:  Yes.   I discussed the limitations, risks, security and privacy concerns of performing an evaluation and management service by telephone. I also discussed with the patient that there may be a patient responsible charge related to this service. The patient expressed understanding and agreed to proceed.  Pt location: Home Physician Location: office Name of referring provider:  [name], FNP I connected with .Kimberly [name] at patients initiation/request on 09/10/2019 at 11:30 AM EST by telephone and verified that I am speaking with the correct person using two identifiers.  Pt MRN:  [id number] Pt DOB:  January 26, 1956   History of Present Illness:  Kimberly [name] is a 59 year oldright-handed black woman with hypertension, hyperlipidemia, paroxysmal atrial fibrillation, coronary artery disease and prior history of strokes who follows up for CVA.  UPDATE: Current medications:  Eliquis; Lipitor 80mg ; ASA 81mg ; gabapentin 100mg  daily  For neuropathic pain, she was started on gabapentin.  It helps a little bit.  Some days are good and some days are bad (such as when   HISTORY: Ms. Fitzner was admitted to [facility] on 02/13/18 for stroke presenting as dizziness, fall and left hemianopia. She was previously on aspirin and Plavix. She was not given tPA due to outside therapeutic window. She was found to have new atrial fibrillation. CT of head revealed right acute to subacute PCA infarct. MRI of brain confirmed large acute right PCA  territory infarct. CTA of head and neck showed bilateral ICA occlusions at origin, right PCA occlusion and basilar artery irregularity but bilateral MCAs patent from collaterals. Carotid doppler showed bilateral ICA occlusion with possible left ICA fresh clot. Transcranial doppler showed left ophthalmic artery and right ACA flow reversal consistent with bilateral carotid system occlusion. 2D echo showed EF 60-65%. LDL was 100. Hgb A1c was 5.8. She remained on ASA and Plavix. Due to large size of stroke, initiation of anticoagulation was postponed until 5 to 7 days post stroke to avoid hemorrhagic conversion. She was subsequently started on Eliquis. Plavix subsequently discontinued. She was started on Lipitor 80mg .   She has residual left sided weakness and pain. She uses a walker. She uses a stress ball to strengthen her hand. Medicaid only approves 3 sessions a year of physical therapy. She smokes cigarettes (about 1 pack per week)   Observations/Objective:  Height 5\' 7"  (1.702 m), weight 137 lb (62.1 kg).  Assessment and Plan:   1.  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  Paroxysmal atrial fibrillation with RVR 3.  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  Hypertension 5.  Hyperlipidemia 6.  Coronary artery disease 7.  Tobacco use disorder  Secondary stroke prevention as managed by PCP/cardiology: 1.  Eliquis for secondary stroke prevention; also ASA for CAD 2.  Lipitor 80mg  daily (LDL goal less than 70) 3.  Blood pressure control 4.  Mediterranean diet 5.  Tobacco cessation 6.  Follow up in 6 months.  Follow Up Instructions:    -I discussed the  assessment and treatment plan with the patient. The patient was provided an opportunity to ask questions and all were answered. The patient agreed with the plan and demonstrated an understanding of the instructions.   The patient was  advised to call back or seek an in-person evaluation if the symptoms worsen or if the condition fails to improve as anticipated.    Total Time spent in visit with the patient was:  14 minutes  Dudley Major, DO

## 2019-09-10 ENCOUNTER — Telehealth (INDEPENDENT_AMBULATORY_CARE_PROVIDER_SITE_OTHER): Payer: Medicaid Other | Admitting: Neurology

## 2019-09-10 ENCOUNTER — Other Ambulatory Visit: Payer: Self-pay

## 2019-09-10 VITALS — Ht 67.0 in | Wt 137.0 lb

## 2019-09-10 DIAGNOSIS — I480 Paroxysmal atrial fibrillation: Secondary | ICD-10-CM | POA: Diagnosis not present

## 2019-09-10 DIAGNOSIS — I63431 Cerebral infarction due to embolism of right posterior cerebral artery: Secondary | ICD-10-CM | POA: Diagnosis not present

## 2019-09-10 DIAGNOSIS — F17200 Nicotine dependence, unspecified, uncomplicated: Secondary | ICD-10-CM

## 2019-09-10 DIAGNOSIS — I10 Essential (primary) hypertension: Secondary | ICD-10-CM

## 2019-09-10 DIAGNOSIS — E785 Hyperlipidemia, unspecified: Secondary | ICD-10-CM

## 2019-10-30 ENCOUNTER — Ambulatory Visit: Payer: Medicaid Other | Admitting: Cardiology

## 2019-11-13 ENCOUNTER — Telehealth: Payer: Self-pay | Admitting: Cardiology

## 2019-11-13 NOTE — Telephone Encounter (Signed)
Patient would like for her sister to come with her to her appt on 4/6, she states her sister comes with her to call her appts.  I explained to her that at this time we are only allowing someone to accompany the patient of it is medically necessary.

## 2019-11-13 NOTE — Telephone Encounter (Signed)
Routed to Patton Salles RN to advise on request for 4/6 appt

## 2019-11-14 NOTE — Telephone Encounter (Signed)
Spoke with patient. Patient's sister to accompany patient to appointment due to "mobility."

## 2019-11-16 DIAGNOSIS — Z7189 Other specified counseling: Secondary | ICD-10-CM | POA: Insufficient documentation

## 2019-11-16 NOTE — Progress Notes (Deleted)
Cardiology Office Note   Date:  11/16/2019   ID:  Elani, [place] Aug 07, 1956, MRN [id number]  PCP:  [name], FNP  Cardiologist:   [name], MD Referring:  ***  No chief complaint on file.     History of Present Illness: Kimberly [name] is a 64 y.o. female who presents for follow up of CAD/CABG.   She was in the hospital with right PCA subacute infarct thought to be related to PAF.  Carotid Doppler showed complete occlusion of bilateral internal carotid artery, and a subsequent CT angiogram of the neck confirmed bilateral internal carotid arteries at the origins, however these appeared to have reconstituted flow in the supraclinoid intracranial arteries from collaterals/posterior communicating arteries. TTE showspreserved EF without any embolic source.    Since I last saw her ***    ***she has done OK.  She gets around with a cane, walker or in a wheelchair with long distances.  The patient denies any new symptoms such as chest discomfort, neck or arm discomfort. There has been no new shortness of breath, PND or orthopnea. There have been no reported palpitations, presyncope or syncope.  She does have what sounds like neuropathic pain with cold extremities on the left side which was the side affected by the stroke.   Past Medical History:  Diagnosis Date  . Bicipital tenosynovitis   . Coronary artery disease 05/2007   [id number]   . Fall 02/13/2018  . Family history of ischemic heart disease   . Gout   . Hammer toe   . Hip, thigh, leg, and ankle, insect bite, nonvenomous, without mention of infection(916.4)   . Hyperlipidemia   . Hypertension   . Other abnormality of red blood cells   . Rotator cuff syndrome of left shoulder   . Stroke [facility])     Past Surgical History:  Procedure Laterality Date  . CARDIAC CATHETERIZATION    . CORONARY ARTERY BYPASS GRAFT     triple  . CORONARY ARTERY BYPASS GRAFT  2008  . TUBAL LIGATION       Current Outpatient  Medications  Medication Sig Dispense Refill  . acetaminophen (TYLENOL) 500 MG tablet Take 1,000 mg by mouth every 6 (six) hours as needed for moderate pain.    [name] amLODipine (NORVASC) 10 MG tablet Take by mouth.    [name] apixaban (ELIQUIS) 5 MG TABS tablet Take 1 tablet (5 mg total) by mouth 2 (two) times daily. 180 tablet 3  . ASPIRIN ADULT LOW STRENGTH 81 MG EC tablet TAKE 1 TABLET BY MOUTH EVERY DAY 90 tablet 1  . atorvastatin (LIPITOR) 80 MG tablet TAKE 1 TABLET BY MOUTH DAILY 90 tablet 1  . benzonatate (TESSALON PERLES) 100 MG capsule Take 1 capsule (100 mg total) by mouth every 6 (six) hours as needed. 30 capsule 0  . clopidogrel (PLAVIX) 75 MG tablet Take by mouth.    . gabapentin (NEURONTIN) 100 MG capsule TAKE ONE CAPSULE 3 TIMES DAILY (Patient taking differently: TAKE ONE CAPSULE DAILY) 270 capsule 1  . loratadine (CLARITIN) 10 MG tablet Take 1 tablet (10 mg total) by mouth daily. 30 tablet 2  . MITIGARE 0.6 MG CAPS TAKE 1 CAPSULE BY MOUTH 2 TIMES DAILY 180 capsule 1  . omeprazole (PRILOSEC) 20 MG capsule TAKE 1 CAPSULE BY MOUTH EVERY DAY 90 capsule 1  . pantoprazole (PROTONIX) 40 MG tablet Take by mouth.    . quinapril (ACCUPRIL) 40 MG tablet Take by mouth.  No current facility-administered medications for this visit.    Allergies:   Clonidine derivatives and Spironolactone    ROS:  Please see the history of present illness.   Otherwise, review of systems are positive for {NONE DEFAULTED:18576::"none"}.   All other systems are reviewed and negative.    PHYSICAL EXAM: VS:  There were no vitals taken for this visit. , BMI There is no height or weight on file to calculate BMI. GENERAL:  Well appearing NECK:  No jugular venous distention, waveform within normal limits, carotid upstroke brisk and symmetric, no bruits, no thyromegaly LUNGS:  Clear to auscultation bilaterally CHEST:  Unremarkable HEART:  PMI not displaced or sustained,S1 and S2 within normal limits, no S3, no S4, no  clicks, no rubs, *** murmurs ABD:  Flat, positive bowel sounds normal in frequency in pitch, no bruits, no rebound, no guarding, no midline pulsatile mass, no hepatomegaly, no splenomegaly EXT:  2 plus pulses throughout, no edema, no cyanosis no clubbing    ***GENERAL:  Well appearing HEENT:  Pupils equal round and reactive, fundi not visualized, oral mucosa unremarkable NECK:  No jugular venous distention, waveform within normal limits, carotid upstroke brisk and symmetric, no bruits, no thyromegaly LYMPHATICS:  No cervical, inguinal adenopathy LUNGS:  Clear to auscultation bilaterally BACK:  No CVA tenderness CHEST:  Unremarkable HEART:  PMI not displaced or sustained,S1 and S2 within normal limits, no S3, no S4, no clicks, no rubs, *** murmurs ABD:  Flat, positive bowel sounds normal in frequency in pitch, no bruits, no rebound, no guarding, no midline pulsatile mass, no hepatomegaly, no splenomegaly EXT:  2 plus pulses throughout, no edema, no cyanosis no clubbing SKIN:  No rashes no nodules NEURO:  Cranial nerves II through XII grossly intact, motor grossly intact throughout PSYCH:  Cognitively intact, oriented to person place and time    EKG:  EKG {ACTION; IS/IS [id number] ordered today. The ekg ordered today demonstrates ***   Recent Labs: 11/28/2018: Hemoglobin 10.1; Platelets 171 09/02/2019: ALT 23; BUN 10; Creatinine, Ser 0.86; Potassium 3.1; Sodium 145    Lipid Panel    Component Value Date/Time   CHOL 114 09/02/2019 1651   TRIG 58 09/02/2019 1651   HDL 47 09/02/2019 1651   CHOLHDL 2.4 09/02/2019 1651   CHOLHDL 3.6 02/14/2018 0314   VLDL 16 02/14/2018 0314   LDLCALC 54 09/02/2019 1651      Wt Readings from Last 3 Encounters:  09/08/19 137 lb (62.1 kg)  07/22/19 135 lb (61.2 kg)  04/02/19 131 lb (59.4 kg)      Other studies Reviewed: Additional studies/ records that were reviewed today include: ***. Review of the above records demonstrates:  Please see  elsewhere in the note.  ***   ASSESSMENT AND PLAN:  CAD:  ***  The patient has no new sypmtoms.  No further cardiovascular testing is indicated.  We will continue with aggressive risk reduction and meds as listed.  HTN: Blood pressure is *** upper limits of normal.  No change in therapy.  Labs in Dec OK.    CAROTID STENOSIS:  She had bilateral carotid occlusion . *** She will be managed medically.  PAF:  Ms. Telisa Chastain has a CHA2DS2 - VASc score of 4.  ***  No change in therapy.  HYPERLIPIDEMIA:    LDL was *** OK.  Continue current therapy.   TOBACCO ABUSE:   *** We talked again about the need to stop smoking.   COVID EDUCATION:  ***  Current medicines are reviewed at length with the patient today.  The patient {ACTIONS; HAS/DOES NOT HAVE:19233} concerns regarding medicines.  The following changes have been made:  {PLAN; NO CHANGE:13088:s}  Labs/ tests ordered today include: *** No orders of the defined types were placed in this encounter.    Disposition:   FU with ***    Signed, [name], MD  11/16/2019 9:13 PM    [facility]

## 2019-11-18 ENCOUNTER — Ambulatory Visit: Payer: Medicaid Other | Admitting: Cardiology

## 2019-12-01 ENCOUNTER — Ambulatory Visit: Payer: Medicaid Other | Admitting: Nurse Practitioner

## 2019-12-02 ENCOUNTER — Other Ambulatory Visit: Payer: Self-pay

## 2019-12-02 ENCOUNTER — Ambulatory Visit (INDEPENDENT_AMBULATORY_CARE_PROVIDER_SITE_OTHER): Payer: Medicaid Other | Admitting: Nurse Practitioner

## 2019-12-02 ENCOUNTER — Encounter: Payer: Self-pay | Admitting: Nurse Practitioner

## 2019-12-02 VITALS — BP 122/80 | HR 82 | Temp 98.5°F | Ht 67.0 in | Wt 113.0 lb

## 2019-12-02 DIAGNOSIS — F329 Major depressive disorder, single episode, unspecified: Secondary | ICD-10-CM | POA: Diagnosis not present

## 2019-12-02 DIAGNOSIS — Z1159 Encounter for screening for other viral diseases: Secondary | ICD-10-CM

## 2019-12-02 DIAGNOSIS — R634 Abnormal weight loss: Secondary | ICD-10-CM | POA: Diagnosis not present

## 2019-12-02 DIAGNOSIS — Z1211 Encounter for screening for malignant neoplasm of colon: Secondary | ICD-10-CM

## 2019-12-02 DIAGNOSIS — F32A Depression, unspecified: Secondary | ICD-10-CM

## 2019-12-02 NOTE — Progress Notes (Signed)
This visit occurred during the SARS-CoV-2 public health emergency.  Safety protocols were in place, including screening questions prior to the visit, additional usage of staff PPE, and extensive cleaning of exam room while observing appropriate contact time as indicated for disinfecting solutions.  Subjective:     Patient ID: Kimberly [name] , female    DOB: April 26, 1956 , 64 y.o.   MRN: [id number]   Chief Complaint  Patient presents with  . Hypertension    HPI  She had been without an aid for 6 weeks.  She has a PCS with [facility] for 2.5 hours.     Wt Readings from Last 3 Encounters: 12/02/19 : 113 lb (51.3 kg) 09/08/19 : 137 lb (62.1 kg) 07/22/19 : 135 lb (61.2 kg)     Hypertension This is a chronic problem. The current episode started more than 1 year ago. The problem is controlled. Pertinent negatives include no anxiety or headaches. There are no associated agents to hypertension. Risk factors for coronary artery disease include sedentary lifestyle. There is no history of chronic renal disease.     Past Medical History:  Diagnosis Date  . Bicipital tenosynovitis   . Coronary artery disease 05/2007   [id number]   . Fall 02/13/2018  . Family history of ischemic heart disease   . Gout   . Hammer toe   . Hip, thigh, leg, and ankle, insect bite, nonvenomous, without mention of infection(916.4)   . Hyperlipidemia   . Hypertension   . Other abnormality of red blood cells   . Rotator cuff syndrome of left shoulder   . Stroke [facility])      Family History  Problem Relation Age of Onset  . Hypertension Mother   . Heart attack Father   . Diabetes Father   . Kidney failure Father   . Anxiety disorder Sister   . Sarcoidosis Sister      Current Outpatient Medications:  .  acetaminophen (TYLENOL) 500 MG tablet, Take 1,000 mg by mouth every 6 (six) hours as needed for moderate pain., Disp: , Rfl:  .  amLODipine (NORVASC) 10 MG tablet, Take by mouth., Disp: , Rfl:  .  apixaban  (ELIQUIS) 5 MG TABS tablet, Take 1 tablet (5 mg total) by mouth 2 (two) times daily., Disp: 180 tablet, Rfl: 3 .  ASPIRIN ADULT LOW STRENGTH 81 MG EC tablet, TAKE 1 TABLET BY MOUTH EVERY DAY, Disp: 90 tablet, Rfl: 1 .  atorvastatin (LIPITOR) 80 MG tablet, TAKE 1 TABLET BY MOUTH DAILY, Disp: 90 tablet, Rfl: 1 .  clopidogrel (PLAVIX) 75 MG tablet, Take by mouth., Disp: , Rfl:  .  gabapentin (NEURONTIN) 100 MG capsule, TAKE ONE CAPSULE 3 TIMES DAILY (Patient taking differently: TAKE ONE CAPSULE DAILY), Disp: 270 capsule, Rfl: 1 .  loratadine (CLARITIN) 10 MG tablet, Take 1 tablet (10 mg total) by mouth daily., Disp: 30 tablet, Rfl: 2 .  MITIGARE 0.6 MG CAPS, TAKE 1 CAPSULE BY MOUTH 2 TIMES DAILY, Disp: 180 capsule, Rfl: 1 .  omeprazole (PRILOSEC) 20 MG capsule, TAKE 1 CAPSULE BY MOUTH EVERY DAY, Disp: 90 capsule, Rfl: 1 .  quinapril (ACCUPRIL) 40 MG tablet, Take by mouth., Disp: , Rfl:  .  benzonatate (TESSALON PERLES) 100 MG capsule, Take 1 capsule (100 mg total) by mouth every 6 (six) hours as needed. (Patient not taking: Reported on 12/02/2019), Disp: 30 capsule, Rfl: 0 .  pantoprazole (PROTONIX) 40 MG tablet, Take by mouth., Disp: , Rfl:    Allergies  Allergen Reactions  .  Clonidine Derivatives Other (See Comments)    Patch causes scars  . Spironolactone Rash     Review of Systems  Constitutional: Negative.   HENT: Positive for rhinorrhea. Negative for congestion.   Respiratory: Negative.  Negative for cough.   Neurological: Positive for weakness. Negative for dizziness and headaches.  Psychiatric/Behavioral: Negative.      Today's Vitals   12/02/19 1150  BP: 122/80  Pulse: 82  Temp: 98.5 F (36.9 C)  TempSrc: Oral  Weight: 113 lb (51.3 kg)  Height: 5' 7"  (1.702 m)  PainSc: 10-Worst pain ever  PainLoc: Foot   Body mass index is 17.7 kg/m.   Objective:  Physical Exam Vitals reviewed.  Constitutional:      General: She is not in acute distress.    Appearance: Normal  appearance.  HENT:     Right Ear: Tympanic membrane and external ear normal. There is no impacted cerumen.     Left Ear: Tympanic membrane and external ear normal. There is no impacted cerumen.  Cardiovascular:     Rate and Rhythm: Normal rate and regular rhythm.     Pulses: Normal pulses.     Heart sounds: Normal heart sounds. No murmur.  Pulmonary:     Effort: Pulmonary effort is normal. No respiratory distress.     Breath sounds: Normal breath sounds. No wheezing.  Musculoskeletal:        General: Normal range of motion.     Right lower leg: No edema.     Left lower leg: No edema.  Skin:    Capillary Refill: Capillary refill takes less than 2 seconds.  Neurological:     General: No focal deficit present.     Mental Status: She is alert and oriented to person, place, and time.  Psychiatric:        Mood and Affect: Mood normal.        Behavior: Behavior normal.        Thought Content: Thought content normal.        Judgment: Judgment normal.         Assessment And Plan:     1. Depression, unspecified depression type  She has been more down in recent months  She would like a referral to a counselor before starting medications  Her depression screen is 6  This could be affecting her weight loss as well - Ambulatory referral to Psychology  2. Abnormal weight loss  She has lost approximately 10 lbs since her last office visit  Her sister feels she has not been eating as much due to not having a PCS staff with her the last 6 weeks.  Samples of protein shakes given as well  Encouraged to drink 1-2 per day - TSH - CMP14+EGFR - Ambulatory referral to Gastroenterology  3. Encounter for hepatitis C screening test for low risk patient  Will check for Hepatitis C screening due to being born between the years 07-1964 - Hepatitis C antibody  4. Encounter for screening colonoscopy According to USPTF Colorectal cancer Screening guidelines. Colonoscopy is recommended  every 10 years, starting at age 92years. Will refer to GI for colon cancer screening. - Ambulatory referral to Gastroenterology           [name], FNP    THE PATIENT IS ENCOURAGED TO PRACTICE SOCIAL DISTANCING DUE TO THE COVID-19 PANDEMIC.

## 2019-12-03 ENCOUNTER — Encounter: Payer: Self-pay | Admitting: Nurse Practitioner

## 2019-12-03 LAB — TSH: TSH: 1.55 u[IU]/mL (ref 0.450–4.500)

## 2019-12-03 LAB — CMP14+EGFR
ALT: 30 IU/L (ref 0–32)
AST: 30 IU/L (ref 0–40)
Albumin/Globulin Ratio: 2.2 (ref 1.2–2.2)
Albumin: 4.6 g/dL (ref 3.8–4.8)
Alkaline Phosphatase: 131 IU/L — ABNORMAL HIGH (ref 39–117)
BUN/Creatinine Ratio: 13 (ref 12–28)
BUN: 10 mg/dL (ref 8–27)
Bilirubin Total: 0.4 mg/dL (ref 0.0–1.2)
CO2: 24 mmol/L (ref 20–29)
Calcium: 9.8 mg/dL (ref 8.7–10.3)
Chloride: 107 mmol/L — ABNORMAL HIGH (ref 96–106)
Creatinine, Ser: 0.8 mg/dL (ref 0.57–1.00)
GFR calc Af Amer: 91 mL/min/{1.73_m2} (ref >59)
GFR calc non Af Amer: 79 mL/min/{1.73_m2} (ref >59)
Globulin, Total: 2.1 g/dL (ref 1.5–4.5)
Glucose: 52 mg/dL — ABNORMAL LOW (ref 65–99)
Potassium: 3.9 mmol/L (ref 3.5–5.2)
Sodium: 145 mmol/L — ABNORMAL HIGH (ref 134–144)
Total Protein: 6.7 g/dL (ref 6.0–8.5)

## 2019-12-03 LAB — HEPATITIS C ANTIBODY: Hep C Virus Ab: <0.1 s/co ratio (ref 0.0–0.9)

## 2019-12-11 ENCOUNTER — Encounter: Payer: Self-pay | Admitting: Nurse Practitioner

## 2019-12-17 ENCOUNTER — Telehealth: Payer: Self-pay

## 2019-12-17 ENCOUNTER — Other Ambulatory Visit: Payer: Self-pay

## 2019-12-17 ENCOUNTER — Ambulatory Visit: Payer: Self-pay

## 2019-12-17 DIAGNOSIS — R634 Abnormal weight loss: Secondary | ICD-10-CM

## 2019-12-17 DIAGNOSIS — F32A Depression, unspecified: Secondary | ICD-10-CM

## 2019-12-17 DIAGNOSIS — R7303 Prediabetes: Secondary | ICD-10-CM

## 2019-12-17 DIAGNOSIS — I10 Essential (primary) hypertension: Secondary | ICD-10-CM

## 2019-12-17 DIAGNOSIS — F329 Major depressive disorder, single episode, unspecified: Secondary | ICD-10-CM

## 2019-12-17 DIAGNOSIS — I639 Cerebral infarction, unspecified: Secondary | ICD-10-CM

## 2019-12-18 NOTE — Chronic Care Management (AMB) (Signed)
  Care Management   Outreach Note  12/18/2019 Name: Kimberly [name] MRN: [id number] DOB: 06-30-1956  Referred by: [name], FNP Reason for referral : Care Coordination (FU RN CC Call )   An unsuccessful telephone outreach was attempted today. The patient was referred to the case management team for assistance with care management and care coordination.   Follow Up Plan: A HIPPA compliant phone message was left for the patient providing contact information and requesting a return call.  Telephone follow up appointment with care management team member scheduled for: 01/16/20  Barb Merino, RN, BSN, CCM Care Management Coordinator [place] [facility]  Direct Phone: [phone]

## 2019-12-31 ENCOUNTER — Telehealth: Payer: Self-pay | Admitting: *Deleted

## 2019-12-31 ENCOUNTER — Other Ambulatory Visit: Payer: Self-pay | Admitting: Gastroenterology

## 2019-12-31 NOTE — Telephone Encounter (Signed)
Primary cardiologist -- Dr [facility] Pre-operative Risk Assessment    HEARTCARE STAFF: - Please ensure there is not already an duplicate clearance open for this procedure - Under Visit Info/Reason for Call, type in Other and utilize the format Clearance MM/DD/YY or Clearance TBD  Request for surgical clearance:  1. What type of surgery is being performed?  EGD and colonoscopy   2. When is this surgery scheduled? 01/30/2020   3. What type of clearance is required (medical clearance vs. Pharmacy clearance to hold med vs. Both)? BOTH  4. Are there any medications that need to be held prior to surgery and how long? ELIQUIS  , PLAVIX  75 MG AND ASPIRIN 81 MG   5. Practice name and name of physician performing surgery?  [facility]; DR PATRICK D.HUNG  6. What is the office phone number?  [phone]   7.   What is the office fax number?  [phone]   8.   Anesthesia type (None, local, MAC, general) ? PROPOFOL    Kimberly [name] 12/31/2019, 6:16 PM  _________________________________________________________________   (provider comments below)

## 2020-01-01 NOTE — Telephone Encounter (Signed)
I s/w pt and informed her she will need an appt for pre op clearance. Pt asked for me to please call her sister Lelan Pons @ [phone]. I then tried to reach pt's sister though left message to please call our office to please schedule an appt for pre op assessment for her sister. I did leave on my message that at this time there is an appt with Dr. Percival Spanish 01/08/20 @ 4:20.

## 2020-01-01 NOTE — Telephone Encounter (Signed)
   Primary Cardiologist:James Hochrein, MD  Chart reviewed as part of pre-operative protocol coverage. Because of Kimberly [name]'s past medical history and time since last visit, he/she will require a follow-up visit in order to better assess preoperative cardiovascular risk.  Pre-op covering staff: - Please schedule appointment and call patient to inform them. - Please contact requesting surgeon's office via preferred method (i.e, phone, fax) to inform them of need for appointment prior to surgery.  If applicable, this message will also be routed to pharmacy pool and/or primary cardiologist for input on holding anticoagulant/antiplatelet agent as requested below so that this information is available at time of patient's appointment.   Kathyrn Drown, NP  01/01/2020, 9:18 AM

## 2020-01-02 NOTE — Telephone Encounter (Signed)
Pt has appt 01/06/20 with Jory Sims, DNP for pre op clearance. I will forward clearance notes to DNP for upcoming appt. I will send FYI to the surgeon pt has appt 5/25 with cardiologist. I will remove from the pre op call back pool.

## 2020-01-05 NOTE — Progress Notes (Signed)
Cardiology Office Note   Date:  01/06/2020   ID:  Hind, Thivierge 04/05/1956, MRN [id number]  PCP:  [name], FNP  Cardiologist:  Dr. Percival Spanish  CC: Preoperative evaluation:   History of Present Illness: Kimberly [name] is a 64 y.o. female who presents for ongoing assessment of CAD with hx of CABG, hx of PAF, with right PCA subacute infarct 2019. 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 However these appeared to have reconstituted flow in the supraclinoid intracranial arteries from collaterals/posterior communicating arteries. TTE showspreserved EF without any embolic source.   Last seen by Dr. Percival Spanish on 10/10/2018 and was doing well. She was not planned for any new testing or medication changes. She was counseled again about smoking cessation.   She is planned for a EDG & colonoscopy. She is to have recommendations concerning medications and for cardiac clearance to proceed. She is on Apixaban and Clopidogrel.   Procedure is planned for 01/30/2020.  The patient denies any hemoptysis, melena, epistaxis,.  She has lost approximately 15 pounds over the last couple of weeks.  She is medically compliant.  Past Medical History:  Diagnosis Date  . Bicipital tenosynovitis   . Coronary artery disease 05/2007   cabgx4   . Fall 02/13/2018  . Family history of ischemic heart disease   . Gout   . Hammer toe   . Hip, thigh, leg, and ankle, insect bite, nonvenomous, without mention of infection(916.4)   . Hyperlipidemia   . Hypertension   . Other abnormality of red blood cells   . Rotator cuff syndrome of left shoulder   . Stroke [facility])     Past Surgical History:  Procedure Laterality Date  . CARDIAC CATHETERIZATION    . CORONARY ARTERY BYPASS GRAFT     triple  . CORONARY ARTERY BYPASS GRAFT  2008  . TUBAL LIGATION       Current Outpatient Medications  Medication Sig Dispense Refill  .  acetaminophen (TYLENOL) 500 MG tablet Take 1,000 mg by mouth every 6 (six) hours as needed for moderate pain.    [name] amLODipine (NORVASC) 10 MG tablet Take by mouth.    [name] apixaban (ELIQUIS) 5 MG TABS tablet Take 1 tablet (5 mg total) by mouth 2 (two) times daily. 180 tablet 3  . ASPIRIN ADULT LOW STRENGTH 81 MG EC tablet TAKE 1 TABLET BY MOUTH EVERY DAY 90 tablet 1  . atorvastatin (LIPITOR) 80 MG tablet TAKE 1 TABLET BY MOUTH DAILY 90 tablet 1  . benzonatate (TESSALON PERLES) 100 MG capsule Take 1 capsule (100 mg total) by mouth every 6 (six) hours as needed. 30 capsule 0  . clopidogrel (PLAVIX) 75 MG tablet Take by mouth.    . gabapentin (NEURONTIN) 100 MG capsule TAKE ONE CAPSULE 3 TIMES DAILY (Patient taking differently: TAKE ONE CAPSULE DAILY) 270 capsule 1  . loratadine (CLARITIN) 10 MG tablet Take 1 tablet (10 mg total) by mouth daily. 30 tablet 2  . MITIGARE 0.6 MG CAPS TAKE 1 CAPSULE BY MOUTH 2 TIMES DAILY 180 capsule 1  . omeprazole (PRILOSEC) 20 MG capsule TAKE 1 CAPSULE BY MOUTH EVERY DAY 90 capsule 1  . pantoprazole (PROTONIX) 40 MG tablet Take by mouth.    . quinapril (ACCUPRIL) 40 MG tablet Take by mouth.     No current facility-administered medications for this visit.    Allergies:   Clonidine derivatives and Spironolactone    Social History:  The patient  reports that she quit smoking about 4 months ago. Her smoking use included cigarettes. She quit smokeless tobacco use about 11 years ago. She reports current alcohol use. She reports current drug use. Drug: Marijuana.   Family History:  The patient's family history includes Anxiety disorder in her sister; Diabetes in her father; Heart attack in her father; Hypertension in her mother; Kidney failure in her father; Sarcoidosis in her sister.    ROS: All other systems are reviewed and negative. Unless otherwise mentioned in H&P    PHYSICAL EXAM: VS:  BP (!) 144/93   Pulse 76   Temp (!) 97.4 F (36.3 C)   Ht 5\' 7"  (1.702  m)   Wt 113 lb (51.3 kg)   SpO2 99%   BMI 17.70 kg/m  , BMI Body mass index is 17.7 kg/m. GEN: Well nourished, well developed, in no acute distress.  Frail, thin, sitting in a wheelchair HEENT: normal Neck: no JVD, carotid bruits, or masses Cardiac: RRR, occasional extrasystole; displaced PMI no murmurs, rubs, or gallops,no edema  Respiratory:  Clear to auscultation bilaterally, normal work of breathing GI: soft, nontender, nondistended, + BS MS: no deformity or atrophy Skin: warm and dry, no rash Neuro:  Strength and sensation are intact Psych: euthymic mood, full affect   EKG: Sinus rhythm with PVCs, LVH with repolarization abnormality, deep T waves V3 4 5 and 6 along with T wave inversions I-II  and aVL.  (Personally reviewed unchanged from prior EKG 11/26/2018). Recent Labs: 12/02/2019: ALT 30; BUN 10; Creatinine, Ser 0.80; Potassium 3.9; Sodium 145; TSH 1.550    Lipid Panel    Component Value Date/Time   CHOL 114 09/02/2019 1651   TRIG 58 09/02/2019 1651   HDL 47 09/02/2019 1651   CHOLHDL 2.4 09/02/2019 1651   CHOLHDL 3.6 2018-03-09 0314   VLDL 16 03/09/18 0314   LDLCALC 54 09/02/2019 1651      Wt Readings from Last 3 Encounters:  01/06/20 113 lb (51.3 kg)  12/02/19 113 lb (51.3 kg)  09/08/19 137 lb (62.1 kg)      Other studies Reviewed: Echocardiogram 03-09-18 Left ventricle: The cavity size was normal. Wall thickness was  increased in a pattern of moderate LVH. There was hypertrophy of  the apex. 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 Systolic function was normal. The estimated  ejection fraction was in the range of 65% to 70%. There was no  dynamic obstruction. There is hypokinesis of the  basalinferolateral and inferior myocardium. Indeterminate  diastolic function.  - Aortic valve: Mildly calcified annulus. Trileaflet.  - Aorta: Ascending aortic diameter: 44 mm (S).  - Ascending aorta: The ascending aorta was  moderately dilated.  - Mitral valve: Mildly thickened leaflets. There was mild  regurgitation.  - Left atrium: The atrium was mildly to moderately dilated.  - Right atrium: The atrium was mildly dilated. Central venous  pressure (est): 3 mm Hg.  - Atrial septum: No defect or patent foramen ovale was identified.  - Tricuspid valve: There was mild regurgitation.  - Pulmonary arteries: PA peak pressure: 31 mm Hg (S).  - Pericardium, extracardiac: There was no pericardial effusion.    ASSESSMENT AND PLAN:  1. Cardiology Pre-Operative Evaluation:Chart reviewed as part of pre-operative protocol coverage. Given past medical history and time since last visit, based on ACC/AHA guidelines, Kimberly [name] would be at acceptable risk for the planned procedure without further cardiovascular testing.   I have spoken with Pharm.D. onsite.  She may hold Eliquis for 24 hours prior to her GI procedure.  This would be on 01/29/2020.  Concerning Plavix we will hold this on 01/25/2020.  She will restart both medications ASAP after procedure dated 01/30/2020.  2.  Paroxysmal atrial fibrillation: She is now in sinus rhythm with premature supraventricular complexes.  She continues on Eliquis.  Holding Eliquis as above for procedure.  She is not on any AV nodal blocking agents at this time.  Heart rate is well controlled.  3.  History of CVA: This occurred in 2019.  I have reviewed this with Pharm.D. onsite.  As above hold Eliquis for 24 hours prior to procedure.  She will hold Plavix for 5 days prior to the procedure.  This is been explained to her and also dates are given.  4.  Hypertension: Blood pressure slightly elevated today.  She will continue on amlodipine 10 mg daily, and Accupril 40 mg daily.  5.  Hyperlipidemia: She remains on atorvastatin 80 mg daily.  Goal of LDL less than 70.  Follow-up labs per PCP. Current medicines are reviewed at length with the patient today.  I have spent 45 minutes dedicated  to the care of this patient on the date of this encounter to include pre-visit review of records, assessment, management and diagnostic testing,with shared decision making.  Labs/ tests ordered today include:None  Phill Myron. West Pugh, ANP, AACC   01/06/2020 4:23 PM    [facility] [place] [address] Office [phone] Fax [phone]  Notice: This dictation was prepared with Dragon dictation along with smaller phrase technology. Any transcriptional errors that result from this process are unintentional and may not be corrected upon review.

## 2020-01-06 ENCOUNTER — Ambulatory Visit (INDEPENDENT_AMBULATORY_CARE_PROVIDER_SITE_OTHER): Payer: Medicaid Other | Admitting: Adult Health

## 2020-01-06 ENCOUNTER — Encounter: Payer: Self-pay | Admitting: Adult Health

## 2020-01-06 ENCOUNTER — Other Ambulatory Visit: Payer: Self-pay

## 2020-01-06 VITALS — BP 144/93 | HR 76 | Temp 97.4°F | Ht 67.0 in | Wt 113.0 lb

## 2020-01-06 DIAGNOSIS — I6300 Cerebral infarction due to thrombosis of unspecified precerebral artery: Secondary | ICD-10-CM | POA: Diagnosis not present

## 2020-01-06 DIAGNOSIS — Z01818 Encounter for other preprocedural examination: Secondary | ICD-10-CM | POA: Diagnosis not present

## 2020-01-06 DIAGNOSIS — I480 Paroxysmal atrial fibrillation: Secondary | ICD-10-CM | POA: Diagnosis not present

## 2020-01-06 DIAGNOSIS — I10 Essential (primary) hypertension: Secondary | ICD-10-CM

## 2020-01-06 DIAGNOSIS — Z01810 Encounter for preprocedural cardiovascular examination: Secondary | ICD-10-CM

## 2020-01-06 NOTE — Patient Instructions (Addendum)
Medication Instructions:  HOLD- Plavix on June 13th resume after procedure HOLD- Eliquis on June 17th resume after procedure  *If you need a refill on your cardiac medications before your next appointment, please call your pharmacy*   Lab Work: None Ordered   Testing/Procedures: None Ordered   Follow-Up: At [facility], you and your health needs are our priority.  As part of our continuing mission to provide you with exceptional heart care, we have created designated Provider Care Teams.  These Care Teams include your primary Cardiologist (physician) and Advanced Practice Providers (APPs -  Physician Assistants and Nurse Practitioners) who all work together to provide you with the care you need, when you need it.  We recommend signing up for the patient portal called "MyChart".  Sign up information is provided on this After Visit Summary.  MyChart is used to connect with patients for Virtual Visits (Telemedicine).  Patients are able to view lab/test results, encounter notes, upcoming appointments, etc.  Non-urgent messages can be sent to your provider as well.   To learn more about what you can do with MyChart, go to [email or web address].    Your next appointment:   3-6  month(s)  The format for your next appointment:   In Person  Provider:   You may see [name], MD or one of the following Advanced Practice Providers on your designated Care Team:    Rosaria Ferries, PA-C  Jory Sims, DNP, ANP  Cadence Kathlen Mody, NP

## 2020-01-13 ENCOUNTER — Telehealth: Payer: Self-pay | Admitting: Cardiology

## 2020-01-13 NOTE — Telephone Encounter (Signed)
Patient's sister would like to speak nurse about when she is to have Emersynn stop taking her medication prior to her surgery. She states she has conflicting dates.

## 2020-01-13 NOTE — Telephone Encounter (Signed)
Spoke with patient's sister. She wanted office to know that she is having a colonoscopy only and not an EGD. Clarification given for Eliquis and Plavix. Understanding verbalized.

## 2020-01-16 ENCOUNTER — Other Ambulatory Visit: Payer: Self-pay

## 2020-01-16 ENCOUNTER — Ambulatory Visit: Payer: Self-pay

## 2020-01-16 ENCOUNTER — Telehealth: Payer: Self-pay

## 2020-01-16 DIAGNOSIS — F32A Depression, unspecified: Secondary | ICD-10-CM

## 2020-01-16 DIAGNOSIS — R7303 Prediabetes: Secondary | ICD-10-CM

## 2020-01-16 DIAGNOSIS — I639 Cerebral infarction, unspecified: Secondary | ICD-10-CM

## 2020-01-16 DIAGNOSIS — R634 Abnormal weight loss: Secondary | ICD-10-CM

## 2020-01-16 DIAGNOSIS — I10 Essential (primary) hypertension: Secondary | ICD-10-CM

## 2020-01-20 ENCOUNTER — Other Ambulatory Visit: Payer: Self-pay | Admitting: Nurse Practitioner

## 2020-01-20 ENCOUNTER — Ambulatory Visit: Payer: Self-pay

## 2020-01-20 ENCOUNTER — Other Ambulatory Visit: Payer: Self-pay

## 2020-01-20 DIAGNOSIS — F32A Depression, unspecified: Secondary | ICD-10-CM

## 2020-01-20 DIAGNOSIS — I10 Essential (primary) hypertension: Secondary | ICD-10-CM

## 2020-01-20 DIAGNOSIS — R634 Abnormal weight loss: Secondary | ICD-10-CM

## 2020-01-20 MED ORDER — BLOOD PRESSURE KIT DEVI: 1 refills | Status: DC

## 2020-01-20 NOTE — Chronic Care Management (AMB) (Signed)
  Care Management   Follow Up Note   01/20/2020 Name: Kimberly [name] MRN: [id number] DOB: 03-24-1956  Referred by: [name], FNP Reason for referral : [place] is a 64 y.o. year old female who is a primary care patient of [name], [place]. The care management team was consulted for assistance with care management and care coordination needs.    Review of patient status, including review of consultants reports, relevant laboratory and other test results, and collaboration with appropriate care team members and the patient's provider was performed as part of comprehensive patient evaluation and provision of chronic care management services.    SDOH (Social Determinants of Health) assessments performed: No See Care Plan activities for detailed interventions related to [facility])     Advanced Directives: See Care Plan and Vynca application for related entries.   Goals Addressed            This Visit's Progress   . "we need to figure out why she is loosing weight"       Sister stated:  Kimberly [name] (see longitudinal plan of care for additional care plan information)  Current Barriers:  [name] Knowledge Deficits related to evaluation and treatment of abnormal weight loss   . Chronic Disease Management support and education needs related to Essential Hypertension, CVA, Depression, Abnormal weight loss   Nurse Case Manager Clinical Goal(s):  [name] Over the next 90 days, patient will work with PCP and GI to address needs related to evaluation and treatment of abnormal weight loss  Social Work [employer]): [name] Over the next 60 days the patient will work with SW to determine if her health plan will assist with costs of nutritional supplements due to abnormal weight loss   CCM SW Interventions: Completed 01/20/20 with the patient and her sister . Inter-disciplinary care team collaboration (see longitudinal plan of care) . Collaboration with RN Care Manager who reports  abnormal weight loss equaling 18 pounds lost in the last 6+ months o RN Care Manager reports the patient is interested in adding Ensure nutritional supplement to daily intake but is unable to afford the cost . Performed chart review to note payor source is [place] Medicaid . Accessed [employer] web page to note [employer] Program available to assist in exploring health plan coverage specific to each patient . Collaboration with [name], FNP to discuss program qualifications o [name] reports she feels this patient would benefit from adding Ensure for nutritional supplementation . Provided [name] with application to [employer] . Successful outbound call placed to the patient and her sister to inform of plan to submit application on behalf of the patient  o The patient and her sister are in agreement of plan . Sw to follow up over the next 3 weeks  Patient Self Care Activities:  . Currently UNABLE TO independently self administer medications; unable to independently perform ADL's and IADL's   Please see past updates related to this goal by clicking on the "Past Updates" button in the selected goal          The care management team will reach out to the patient again over the next 21 days.   [name], BSW, CDP Social Worker, Certified Dementia Practitioner [place] [facility] [place] [facility] [phone]

## 2020-01-20 NOTE — Patient Instructions (Signed)
Visit Information  Goals Addressed            This Visit's Progress     Patient Stated    COMPLETED: Chronic Care Management Needs (pt-stated)       Current Barriers:   Ineffective Self Health Maintenance  Knowledge Deficits related to short term plan for care coordination needs and long term plans for chronic disease management needs  Nurse Case Manager Clinical Goal(s):   Over the next 90 days, patient will work with care management team to address care coordination and chronic disease management needs related to Disease Management, Educational Needs, Care Coordination    CCM RN CM Interventions:  01/19/20 Call completed with sister [name]  Assessed for ongoing chronic disease management and education needs  Assessed for community resource needs  Assessed for home safety concerns and or care coordination needs - sister states patient has services in place at this time  See other goals  Patient Self Care Activities:   Verbalizes understanding of the education/information provided today  Currently UNABLE TO independently self manage needs related to chronic health conditions.   Please see past updates related to this goal by clicking on the "Past Updates" button in the selected goal       Other    "to help her get control over her depression"       Sister stated   [place] (see longitudinal plan of care for additional care plan information)  Current Barriers:   Knowledge Deficits related to evaluation and treatment of Depression   Chronic Disease Management support and education needs related to Depression, Essential Hypertension, CVA, Abnormal Weight Loss   Nurse Case Manager Clinical Goal(s):   Over the next 90 days, patient will work with the CCM team and PCP to address needs related to disease education and support to improve Self Health management of depression   CCM RN CM Interventions:  01/19/20 call completed with sister  Daneil Dolin care team collaboration (see longitudinal plan of care)  Evaluation of current treatment plan related to Depression and patient's adherence to plan as established by provider  Reviewed medications with patient and discussed patient is not currently taking an anti-depressant due to PCP recommended talk therapy   Determined PCP sent a Psychology referral at last visit in April; Determined Ms. Wilfong nor sister [name] were contacted by the Psychologist to schedule a new patient appointment  Determined the referral was processed and sent to [facility]; Provided [name] the contact name/number and advised her to contact this provider ASAP in order to schedule a new patient appointment and to get services started  Determined currently Ms. Mcnealy family alternates visiting with her, they prepare meals and take her for short drives  Reviewed upcoming appointment with PCP provider [name], FNP scheduled for 01/21/20 @11 :21 AM   Discussed plans with patient for ongoing care management follow up and provided patient with direct contact information for care management team  Provided patient with printed educational materials related to [facility] Safety Tool   Patient Self Care Activities:   Currently UNABLE TO independently self administer medications; unable to independently perform ADL's and IADL's   Initial goal documentation      "we need to figure out why she is loosing weight"       Sister stated:  [place] (see longitudinal plan of care for additional care plan information)  Current Barriers:   Knowledge Deficits related to evaluation  and treatment of abnormal weight loss    Chronic Disease Management support and education needs related to Essential Hypertension, CVA, Depression, Abnormal weight loss   Nurse Case Manager Clinical Goal(s):   Over the next 90 days, patient will work with PCP and GI to address needs  related to evaluation and treatment of abnormal weight loss  CCM RN CM Interventions:  01/19/20 spoke to patient's sister Daneil Dolin care team collaboration (see longitudinal plan of care)  Evaluation of current treatment plan related to Abnormal weight loss and patient's adherence to plan as established by provider  Determined patient is scheduled for a Colonoscopy on 01/30/20 for evaluation of abnormal weight loss  Discussed Ms. Morford has had an 21 weight loss since last November for an unknown reason  Discussed patient's recent Cardiology visit to establish how to hold her anticoagulants pre-operatively for the Colonscopy  Determined and reviewed with sister [name] the following recommendations were provided per Cardiology; o  ASSESSMENT AND PLAN: o 1. Cardiology Pre-Operative Evaluation:Chart reviewed as part of pre-operative protocol coverage. Given past medical history and time since last visit, based on ACC/AHA guidelines, Kenlyn Cabezas would be at acceptable risk for the planned procedure without further cardiovascular testing.  o I have spoken with Pharm.D. onsite.  She may hold Eliquis for 24 hours prior to her GI procedure.  This would be on 01/29/2020.  Concerning Plavix we will hold this on 01/25/2020.  She will restart both medications ASAP after procedure dated 01/30/2020. o 2.  Paroxysmal atrial fibrillation: She is now in sinus rhythm with premature supraventricular complexes.  She continues on Eliquis.  Holding Eliquis as above for procedure.  She is not on any AV nodal blocking agents at this time.  Heart rate is well controlled. o 3.  History of CVA: This occurred in 2019.  I have reviewed this with Pharm.D. onsite.  As above hold Eliquis for 24 hours prior to procedure.  She will hold Plavix for 5 days prior to the procedure.  This is been explained to her and also dates are given. o 4.  Hypertension: Blood pressure slightly elevated today.  She will continue on  amlodipine 10 mg daily, and Accupril 40 mg daily. o 5.  Hyperlipidemia: She remains on atorvastatin 80 mg daily.  Goal of LDL less than 70.  Follow-up labs per PCP. o Current medicines are reviewed at length with the patient today.  I have spent 45 minutes dedicated to the care of this patient on the date of this encounter to include pre-visit review of records, assessment, management and diagnostic testing,with shared decision making.  Assessed for patient's current meal plan at home, per sister [name], the family prepares her meals and provides her with fresh fruits and vegetables and plenty of protein  Determined patient is not displaying GI symptoms, however, she has a poor appetite, her sister thinks this is related to her depression   Assessed for nutritional supplementation, currently, the patient is unable to afford to purchase Ensure or Boost, she will drink Ensure if she has it on hand  Sent in basket message to embedded BSW requesting f/u on resources to help cover the cost of Ensure  Discussed Tillie Rung will outreach to [employer] to explore programs and or resources to help with nutritional support  Sent in basket message to CMA Tianna B requesting Ensure samples be provided to Ms. Ringold during her OV scheduled for 01/21/20, reply received that samples will be provided   Discussed plans  with patient for ongoing care management follow up and provided patient with direct contact information for care management team  Patient Self Care Activities:   Currently UNABLE TO independently self administer medications; unable to independently perform ADL's and IADL's   Initial goal documentation      COMPLETED: She needs help with her medications       Current Barriers:   Knowledge Deficits related to Medication Administration and Long Term Management  Nurse Case Manager Clinical Goal(s):   Over the next 30 days, patient will work with [facility] nurse to address  needs related to education and assistance with medication administration and management.   CCM RN CM Interventions:  01/19/20 Completed call with sister [name]   Assessed for medication adherence; assessed for questions/concerns regarding medication management  Determined patient is taking her medications as prescribed with assistance of her sister and CNA  Determined sister Rosemarie Ax having no current concerns related to Ms. Huhn medications at this time  Discussed plans with patient for ongoing care management follow up and provided patient with direct contact information for care management team  Patient Self Care Activities:   Currently UNABLE TO independently self administer medications; unable to independently perform ADL's and IADL's   Please see past updates related to this goal by clicking on the "Past Updates" button in the selected goal        We need a Rx for a BP cuff   Not on track    Sister stated          [place] (see longitudinal plan of care for additional care plan information)  Current Barriers:   Knowledge Deficits related to disease process and Self Health management of Hypertension   Chronic Disease Management support and education needs related to Essential Hypertension, CVA, Depression, Abnormal weight loss     Patient does not currently have a BP cuff in her home  Nurse Case Manager Clinical Goal(s):   01/19/20 New Over the next 14 days, patient will have a BP cuff in her home for daily BP checks.   01/19/20 New Over the next 30 days, patient will work with provider to address needs related to Hypertension treatment management.   CCM RN CM Interventions:  01/19/20: Completed call with sister Daneil Dolin care team collaboration (see longitudinal plan of care)  Evaluation of current treatment plan related to HTN and patient's adherence to plan as established by provider  Determined patient's BP was elevated during  her Cardiology visit on 01/06/20 and noted to be 144/93  Determined patient is adhering to taking her medications as prescribed, she is prescribed to take Amlodipine 10 mg daily, and Accupril 40 mg daily  Determined Ms. Dizdarevic does not yet have a BP cuff at home, her sister is a nurse and is agreeable to picking up Rx for BP cuff when ready  Provided education to patient re: importance of monitoring for adequate BP control <354/65 to prevent complications including, worsening heart disease, renal disease, Stroke and or Cardio Pulmonary related complications  Discussed patient's sister will instruct her CNA to monitor her BP at home daily and record the readings to evaluate for target BP <130/80   Collaborated with PCP provider [name], FNP regarding Rx needed for BP cuff   Discussed plans with patient for ongoing care management follow up and provided patient with direct contact information for care management team  Provided patient with printed educational materials related to What is  High Blood Pressure?; Why Should I Lower Sodium?; African Americans and High Blood Pressure; High Blood Pressure and Stroke; BP log   Patient Self Care Activities:   Verbalizes understanding of the education/information provided today  Currently UNABLE TO independently self administer medications; unable to independently perform ADL's and IADL's   Please see past updates related to this goal by clicking on the "Past Updates" button in the selected goal        Patient verbalizes understanding of instructions provided today.   Telephone follow up appointment with care management team member scheduled for: 02/13/20  Barb Merino, RN, BSN, CCM Care Management Coordinator [facility]  Direct Phone: [phone]

## 2020-01-20 NOTE — Patient Instructions (Signed)
Visit Information  Goals Addressed            This Visit's Progress   . "we need to figure out why she is loosing weight"       Sister stated:  [place] (see longitudinal plan of care for additional care plan information)  Current Barriers:  [name] Knowledge Deficits related to evaluation and treatment of abnormal weight loss   . Chronic Disease Management support and education needs related to Essential Hypertension, CVA, Depression, Abnormal weight loss   Nurse Case Manager Clinical Goal(s):  [name] Over the next 90 days, patient will work with PCP and GI to address needs related to evaluation and treatment of abnormal weight loss  Social Work [employer]): [name] Over the next 60 days the patient will work with SW to determine if her health plan will assist with costs of nutritional supplements due to abnormal weight loss   CCM RN CM Interventions:  01/19/20 spoke to patient's sister [name] . Inter-disciplinary care team collaboration (see longitudinal plan of care) . Evaluation of current treatment plan related to Abnormal weight loss and patient's adherence to plan as established by provider . Determined patient is scheduled for a Colonoscopy on 01/30/20 for evaluation of abnormal weight loss . Discussed Ms. Casamento has had an 18 weight loss since last November for an unknown reason . Discussed patient's recent Cardiology visit to establish how to hold her anticoagulants pre-operatively for the Colonscopy . Determined and reviewed with sister [name] the following recommendations were provided per Cardiology; o  ASSESSMENT AND PLAN: o 1. Cardiology Pre-Operative Evaluation:Chart reviewed as part of pre-operative protocol coverage. Given past medical history and time since last visit, based on ACC/AHA guidelines, Imelda Authier would be at acceptable risk for the planned procedure without further cardiovascular testing.  o I have spoken with Pharm.D. onsite.  She may hold Eliquis for 24 hours  prior to her GI procedure.  This would be on 01/29/2020.  Concerning Plavix we will hold this on 01/25/2020.  She will restart both medications ASAP after procedure dated 01/30/2020. o 2.  Paroxysmal atrial fibrillation: She is now in sinus rhythm with premature supraventricular complexes.  She continues on Eliquis.  Holding Eliquis as above for procedure.  She is not on any AV nodal blocking agents at this time.  Heart rate is well controlled. o 3.  History of CVA: This occurred in 2019.  I have reviewed this with Pharm.D. onsite.  As above hold Eliquis for 24 hours prior to procedure.  She will hold Plavix for 5 days prior to the procedure.  This is been explained to her and also dates are given. o 4.  Hypertension: Blood pressure slightly elevated today.  She will continue on amlodipine 10 mg daily, and Accupril 40 mg daily. o 5.  Hyperlipidemia: She remains on atorvastatin 80 mg daily.  Goal of LDL less than 70.  Follow-up labs per PCP. o Current medicines are reviewed at length with the patient today.  I have spent 45 minutes dedicated to the care of this patient on the date of this encounter to include pre-visit review of records, assessment, management and diagnostic testing,with shared decision making. . Assessed for patient's current meal plan at home, per sister [name], the family prepares her meals and provides her with fresh fruits and vegetables and plenty of protein . Determined patient is not displaying GI symptoms, however, she has a poor appetite, her sister thinks this is related to her depression  .  Assessed for nutritional supplementation, currently, the patient is unable to afford to purchase Ensure or Boost, she will drink Ensure if she has it on hand . Sent in basket message to embedded BSW requesting f/u on resources to help cover the cost of Ensure . Discussed Tillie Rung will outreach to [employer] to explore programs and or resources to help with nutritional support . Sent in  basket message to CMA Tianna B requesting Ensure samples be provided to Ms. Charters during her OV scheduled for 01/21/20, reply received that samples will be provided  . Discussed plans with patient for ongoing care management follow up and provided patient with direct contact information for care management team   CCM SW Interventions: Completed 01/20/20 with the patient and her sister . Inter-disciplinary care team collaboration (see longitudinal plan of care) . Collaboration with RN Care Manager who reports abnormal weight loss equaling 18 pounds lost in the last 6+ months o RN Care Manager reports the patient is interested in adding Ensure nutritional supplement to daily intake but is unable to afford the cost . Performed chart review to note payor source is  Medicaid . Accessed [employer] web page to note [employer] Program available to assist in exploring health plan coverage specific to each patient . Collaboration with [name], FNP to discuss program qualifications o [name] reports she feels this patient would benefit from adding Ensure for nutritional supplementation . Provided [name] with application to [employer] . Successful outbound call placed to the patient and her sister to inform of plan to submit application on behalf of the patient  o The patient and her sister are in agreement of plan . Sw to follow up over the next 3 weeks  Patient Self Care Activities:  . Currently UNABLE TO independently self administer medications; unable to independently perform ADL's and IADL's   Please see past updates related to this goal by clicking on the "Past Updates" button in the selected goal         The care management team will reach out to the patient again over the next 21 days.   [name], BSW, CDP Social Worker, Certified Dementia Practitioner [place] [facility] [phone]

## 2020-01-20 NOTE — Chronic Care Management (AMB) (Signed)
Care Management   Follow Up Note   01/19/2020 Name: Kimberly [name] MRN: [id number] DOB: 09/30/1955  Referred by: Kimberly Brine, FNP Reason for referral : Care Coordination (FU RN CM Call )   Kimberly [name] is a 64 y.o. year old female who is a primary care patient of Kimberly [name], Kimberly [name]. The CCM team was consulted for assistance with chronic disease management and care coordination needs.    Review of patient status, including review of consultants reports, relevant laboratory and other test results, and collaboration with appropriate care team members and the patient's provider was performed as part of comprehensive patient evaluation and provision of chronic care management services.    SDOH (Social Determinants of Health) assessments performed: Yes  See Care Plan activities for detailed interventions related to Kimberly [name])   Placed outbound CC RN CM follow up call to sister Kimberly [name] for a care plan update.     Outpatient Encounter Medications as of 01/16/2020  Medication Sig  . acetaminophen (TYLENOL) 500 MG tablet Take 1,000 mg by mouth every 6 (six) hours as needed for moderate pain.  [name] amLODipine (NORVASC) 10 MG tablet Take by mouth.  [name] apixaban (ELIQUIS) 5 MG TABS tablet Take 1 tablet (5 mg total) by mouth 2 (two) times daily.  . ASPIRIN ADULT LOW STRENGTH 81 MG EC tablet TAKE 1 TABLET BY MOUTH EVERY DAY  . atorvastatin (LIPITOR) 80 MG tablet TAKE 1 TABLET BY MOUTH DAILY  . benzonatate (TESSALON PERLES) 100 MG capsule Take 1 capsule (100 mg total) by mouth every 6 (six) hours as needed.  . clopidogrel (PLAVIX) 75 MG tablet Take by mouth.  . gabapentin (NEURONTIN) 100 MG capsule TAKE ONE CAPSULE 3 TIMES DAILY (Patient taking differently: TAKE ONE CAPSULE DAILY)  . loratadine (CLARITIN) 10 MG tablet Take 1 tablet (10 mg total) by mouth daily.  [name] MITIGARE 0.6 MG CAPS TAKE 1 CAPSULE BY MOUTH 2 TIMES DAILY  . omeprazole (PRILOSEC) 20 MG capsule TAKE 1 CAPSULE BY MOUTH EVERY DAY  .  pantoprazole (PROTONIX) 40 MG tablet Take by mouth.  . quinapril (ACCUPRIL) 40 MG tablet Take by mouth.   No facility-administered encounter medications on file as of 01/16/2020.     Objective:  Lab Results  Component Value Date   HGBA1C 5.5 09/02/2019   HGBA1C 5.9 (H) 07/24/2018   HGBA1C 5.8 (H) 02/14/2018   Lab Results  Component Value Date   LDLCALC 54 09/02/2019   CREATININE 0.80 12/02/2019   BP Readings from Last 3 Encounters:  01/06/20 (!) 144/93  12/02/19 122/80  09/02/19 138/80    Goals Addressed      Patient Stated   . COMPLETED: Chronic Care Management Needs (pt-stated)       Current Barriers:   Ineffective Self Health Maintenance  Knowledge Deficits related to short term plan for care coordination needs and long term plans for chronic disease management needs  Nurse Case Manager Clinical Goal(s):   Over the next 90 days, patient will work with care management team to address care coordination and chronic disease management needs related to Disease Management, Educational Needs, Care Coordination    CCM RN CM Interventions:  01/19/20 Call completed with sister Kimberly [name]  Assessed for ongoing chronic disease management and education needs  Assessed for community resource needs  Assessed for home safety concerns and or care coordination needs - sister states patient has services in place at this time  See other goals  Patient Self Care Activities:   Verbalizes understanding of  the education/information provided today  Currently UNABLE TO independently self manage needs related to chronic health conditions.   Please see past updates related to this goal by clicking on the "Past Updates" button in the selected goal       Other   . "to help her get control over her depression"       Sister stated   [place] (see longitudinal plan of care for additional care plan information)  Current Barriers:  [name] Knowledge Deficits related to evaluation and  treatment of Depression  . Chronic Disease Management support and education needs related to Depression, Essential Hypertension, CVA, Abnormal Weight Loss   Nurse Case Manager Clinical Goal(s):  [name] Over the next 90 days, patient will work with the CCM team and PCP to address needs related to disease education and support to improve Self Health management of depression   CCM RN CM Interventions:  01/19/20 call completed with sister Kimberly [name] . Inter-disciplinary care team collaboration (see longitudinal plan of care) . Evaluation of current treatment plan related to Depression and patient's adherence to plan as established by provider . Reviewed medications with patient and discussed patient is not currently taking an anti-depressant due to PCP recommended talk therapy  . Determined PCP sent a Psychology referral at last visit in April; Determined Kimberly [name] nor sister Kimberly [name] were contacted by the Psychologist to schedule a new patient appointment . Determined the referral was processed and sent to [facility]; Provided Kimberly [name] the contact name/number and advised her to contact this provider ASAP in order to schedule a new patient appointment and to get services started . Determined currently Kimberly [name] family alternates visiting with her, they prepare meals and take her for short drives . Reviewed upcoming appointment with PCP provider Kimberly Brine, FNP scheduled for 01/21/20 @11 :46 AM  . Discussed plans with patient for ongoing care management follow up and provided patient with direct contact information for care management team . Provided patient with printed educational materials related to Mood and Your Health Zone Safety Tool   Patient Self Care Activities:  . Currently UNABLE TO independently self administer medications; unable to independently perform ADL's and IADL's   Initial goal documentation     . "we need to figure out why she is loosing weight"       Sister stated:  [place] (see longitudinal plan of care for additional care plan information)  Current Barriers:  [name] Knowledge Deficits related to evaluation and treatment of abnormal weight loss   . Chronic Disease Management support and education needs related to Essential Hypertension, CVA, Depression, Abnormal weight loss   Nurse Case Manager Clinical Goal(s):  [name] Over the next 90 days, patient will work with PCP and GI to address needs related to evaluation and treatment of abnormal weight loss  CCM RN CM Interventions:  01/19/20 spoke to patient's sister Kimberly [name] . Inter-disciplinary care team collaboration (see longitudinal plan of care) . Evaluation of current treatment plan related to Abnormal weight loss and patient's adherence to plan as established by provider . Determined patient is scheduled for a Colonoscopy on 01/30/20 for evaluation of abnormal weight loss . Discussed Ms. Bentley has had an 18 weight loss since last November for an unknown reason . Discussed patient's recent Cardiology visit to establish how to hold her anticoagulants pre-operatively for the Colonscopy . Determined and reviewed with sister Kimberly [name] the following recommendations were provided per Cardiology; o  ASSESSMENT AND PLAN: o 1.  Cardiology Pre-Operative Evaluation:Chart reviewed as part of pre-operative protocol coverage. Given past medical history and time since last visit, based on ACC/AHA guidelines, Greenly Keitt would be at acceptable risk for the planned procedure without further cardiovascular testing.  o I have spoken with Pharm.D. onsite.  She may hold Eliquis for 24 hours prior to her GI procedure.  This would be on 01/29/2020.  Concerning Plavix we will hold this on 01/25/2020.  She will restart both medications ASAP after procedure dated 01/30/2020. o 2.  Paroxysmal atrial fibrillation: She is now in sinus rhythm with premature supraventricular complexes.  She continues on Eliquis.  Holding Eliquis as above for  procedure.  She is not on any AV nodal blocking agents at this time.  Heart rate is well controlled. o 3.  History of CVA: This occurred in 2019.  I have reviewed this with Pharm.D. onsite.  As above hold Eliquis for 24 hours prior to procedure.  She will hold Plavix for 5 days prior to the procedure.  This is been explained to her and also dates are given. o 4.  Hypertension: Blood pressure slightly elevated today.  She will continue on amlodipine 10 mg daily, and Accupril 40 mg daily. o 5.  Hyperlipidemia: She remains on atorvastatin 80 mg daily.  Goal of LDL less than 70.  Follow-up labs per PCP. o Current medicines are reviewed at length with the patient today.  I have spent 45 minutes dedicated to the care of this patient on the date of this encounter to include pre-visit review of records, assessment, management and diagnostic testing,with shared decision making. . Assessed for patient's current meal plan at home, per sister Kimberly [name], the family prepares her meals and provides her with fresh fruits and vegetables and plenty of protein . Determined patient is not displaying GI symptoms, however, she has a poor appetite, her sister thinks this is related to her depression  . Assessed for nutritional supplementation, currently, the patient is unable to afford to purchase Ensure or Boost, she will drink Ensure if she has it on hand . Sent in basket message to embedded BSW requesting f/u on resources to help cover the cost of Ensure . Discussed Tillie Rung will outreach to [employer] to explore programs and or resources to help with nutritional support . Sent in basket message to CMA Tianna B requesting Ensure samples be provided to Ms. Engel during her OV scheduled for 01/21/20, reply received that samples will be provided  . Discussed plans with patient for ongoing care management follow up and provided patient with direct contact information for care management team  Patient Self Care Activities:    . Currently UNABLE TO independently self administer medications; unable to independently perform ADL's and IADL's   Initial goal documentation    . COMPLETED: She needs help with her medications       Current Barriers:  [name] Knowledge Deficits related to Medication Administration and Long Term Management  Nurse Case Manager Clinical Goal(s):  [name] Over the next 30 days, patient will work with [facility] to address needs related to education and assistance with medication administration and management.   CCM RN CM Interventions:  01/19/20 Completed call with sister Kimberly [name]  . Assessed for medication adherence; assessed for questions/concerns regarding medication management . Determined patient is taking her medications as prescribed with assistance of her sister and CNA . Determined sister Rosemarie Ax having no current concerns related to Ms. Bertagnolli medications at this time .  Discussed plans with patient for ongoing care management follow up and provided patient with direct contact information for care management team  Patient Self Care Activities:  . Currently UNABLE TO independently self administer medications; unable to independently perform ADL's and IADL's   Please see past updates related to this goal by clicking on the "Past Updates" button in the selected goal     . We need a Rx for a BP cuff   Not on track    Sister stated          [place] (see longitudinal plan of care for additional care plan information)  Current Barriers:  [name] Knowledge Deficits related to disease process and Self Health management of Hypertension  . Chronic Disease Management support and education needs related to Essential Hypertension, CVA, Depression, Abnormal weight loss    . Patient does not currently have a BP cuff in her home  Nurse Case Manager Clinical Goal(s):   01/19/20 New Over the next 14 days, patient will have a BP cuff in her home for daily BP checks.  [name] 01/19/20  New Over the next 30 days, patient will work with provider to address needs related to Hypertension treatment management.   CCM RN CM Interventions:  01/19/20: Completed call with sister Kimberly [name] . Inter-disciplinary care team collaboration (see longitudinal plan of care) . Evaluation of current treatment plan related to HTN and patient's adherence to plan as established by provider . Determined patient's BP was elevated during her Cardiology visit on 01/06/20 and noted to be 144/93 . Determined patient is adhering to taking her medications as prescribed, she is prescribed to take Amlodipine 10 mg daily, and Accupril 40 mg daily . Determined Ms. Englander does not yet have a BP cuff at home, her sister is a nurse and is agreeable to picking up Rx for BP cuff when ready . Provided education to patient re: importance of monitoring for adequate BP control <017/51 to prevent complications including, worsening heart disease, renal disease, Stroke and or Cardio Pulmonary related complications . Discussed patient's sister will instruct her CNA to monitor her BP at home daily and record the readings to evaluate for target BP <130/80  . Collaborated with PCP provider Kimberly Brine, FNP regarding Rx needed for BP cuff  . Discussed plans with patient for ongoing care management follow up and provided patient with direct contact information for care management team . Provided patient with printed educational materials related to What is High Blood Pressure?; Why Should I Lower Sodium?; African Americans and High Blood Pressure; High Blood Pressure and Stroke; BP log   Patient Self Care Activities:   Verbalizes understanding of the education/information provided today . Currently UNABLE TO independently self administer medications; unable to independently perform ADL's and IADL's   Please see past updates related to this goal by clicking on the "Past Updates" button in the selected goal        Plan:   Telephone  follow up appointment with care management team member scheduled for: 02/13/20  Barb Merino, RN, BSN, CCM Care Management Coordinator [place] [facility]  Direct Phone: [phone]

## 2020-01-21 ENCOUNTER — Other Ambulatory Visit: Payer: Self-pay

## 2020-01-21 ENCOUNTER — Ambulatory Visit (INDEPENDENT_AMBULATORY_CARE_PROVIDER_SITE_OTHER): Payer: Medicaid Other | Admitting: Nurse Practitioner

## 2020-01-21 ENCOUNTER — Encounter: Payer: Self-pay | Admitting: Nurse Practitioner

## 2020-01-21 VITALS — BP 120/78 | HR 75 | Temp 98.2°F | Wt 113.0 lb

## 2020-01-21 DIAGNOSIS — R634 Abnormal weight loss: Secondary | ICD-10-CM

## 2020-01-21 DIAGNOSIS — Z1231 Encounter for screening mammogram for malignant neoplasm of breast: Secondary | ICD-10-CM | POA: Diagnosis not present

## 2020-01-21 NOTE — Progress Notes (Addendum)
This visit occurred during the SARS-CoV-2 public health emergency.  Safety protocols were in place, including screening questions prior to the visit, additional usage of staff PPE, and extensive cleaning of exam room while observing appropriate contact time as indicated for disinfecting solutions.  Subjective:     Patient ID: Kimberly [name] , female    DOB: [birth year] , 64 y.o.   MRN: [id number]   Chief Complaint  Patient presents with  . Weight Loss    HPI  She is here for weight check. She is not currently drinking any Ensure  Wt Readings from Last 3 Encounters: 01/21/20 : 113 lb (51.3 kg) 01/06/20 : 113 lb (51.3 kg) 12/02/19 : 113 lb (51.3 kg)  She has not been to the psychologist but plans to go call them for an appt.     Past Medical History:  Diagnosis Date  . Bicipital tenosynovitis   . Coronary artery disease 05/2007   [id number]   . Fall 02/13/2018  . Family history of ischemic heart disease   . Gout   . Hammer toe   . Hip, thigh, leg, and ankle, insect bite, nonvenomous, without mention of infection(916.4)   . Hyperlipidemia   . Hypertension   . Other abnormality of red blood cells   . Rotator cuff syndrome of left shoulder   . Stroke [facility])      Family History  Problem Relation Age of Onset  . Hypertension Mother   . Heart attack Father   . Diabetes Father   . Kidney failure Father   . Anxiety disorder Sister   . Sarcoidosis Sister      Current Outpatient Medications:  .  acetaminophen (TYLENOL) 500 MG tablet, Take 1,000 mg by mouth every 6 (six) hours as needed for moderate pain., Disp: , Rfl:  .  amLODipine (NORVASC) 10 MG tablet, Take by mouth., Disp: , Rfl:  .  ASPIRIN ADULT LOW STRENGTH 81 MG EC tablet, TAKE 1 TABLET BY MOUTH EVERY DAY, Disp: 90 tablet, Rfl: 1 .  atorvastatin (LIPITOR) 80 MG tablet, TAKE 1 TABLET BY MOUTH DAILY, Disp: 90 tablet, Rfl: 1 .  benzonatate (TESSALON PERLES) 100 MG capsule, Take 1 capsule (100 mg total) by mouth every 6  (six) hours as needed., Disp: 30 capsule, Rfl: 0 .  Blood Pressure Monitoring (BLOOD PRESSURE KIT) DEVI, Use as directed to check blood pressure daily dx: I10, Disp: 1 each, Rfl: 1 .  ELIQUIS 5 MG TABS tablet, TAKE 1 TABLET BY MOUTH 2 TIMES DAILY, Disp: 180 tablet, Rfl: 3 .  gabapentin (NEURONTIN) 100 MG capsule, TAKE ONE CAPSULE 3 TIMES DAILY (Patient taking differently: TAKE ONE CAPSULE DAILY), Disp: 270 capsule, Rfl: 1 .  loratadine (CLARITIN) 10 MG tablet, Take 1 tablet (10 mg total) by mouth daily., Disp: 30 tablet, Rfl: 2 .  MITIGARE 0.6 MG CAPS, TAKE 1 CAPSULE BY MOUTH 2 TIMES DAILY, Disp: 180 capsule, Rfl: 1 .  omeprazole (PRILOSEC) 20 MG capsule, TAKE 1 CAPSULE BY MOUTH EVERY DAY, Disp: 90 capsule, Rfl: 1 .  quinapril (ACCUPRIL) 40 MG tablet, Take by mouth., Disp: , Rfl:  .  clopidogrel (PLAVIX) 75 MG tablet, Take by mouth., Disp: , Rfl:    Allergies  Allergen Reactions  . Clonidine Derivatives Other (See Comments)    Patch causes scars  . Spironolactone Rash     Review of Systems  Constitutional: Negative.   Respiratory: Negative.   Cardiovascular: Negative.  Negative for chest pain, palpitations and leg swelling.  Musculoskeletal:  Negative.   Neurological: Negative for dizziness and headaches.  Psychiatric/Behavioral: Negative.      Today's Vitals   01/21/20 1143  BP: 120/78  Pulse: 75  Temp: 98.2 F (36.8 C)  Weight: 113 lb (51.3 kg)  PainSc: 0-No pain   Body mass index is 17.7 kg/m.   Objective:  Physical Exam Constitutional:      Appearance: Normal appearance.  Cardiovascular:     Rate and Rhythm: Normal rate and regular rhythm.     Pulses: Normal pulses.     Heart sounds: Normal heart sounds. No murmur.  Pulmonary:     Effort: Pulmonary effort is normal. No respiratory distress.     Breath sounds: Normal breath sounds.  Neurological:     General: No focal deficit present.     Mental Status: She is alert and oriented to person, place, and time.   Psychiatric:        Mood and Affect: Mood normal.        Behavior: Behavior normal.        Thought Content: Thought content normal.        Judgment: Judgment normal.         Assessment And Plan:     1. Encounter for screening mammogram for malignant neoplasm of breast  Pt instructed on Self Breast Exam.According to ACOG guidelines Women aged 42 and older are recommended to get an annual mammogram. Form completed and given to patient contact the [facility] for appointment scheduing.   Pt encouraged to get annual mammogram - MM DIGITAL SCREENING BILATERAL; Future  2. Abnormal weight loss  Her weight is stable this visit  Will complete a form for her to get Ensure through her insurance, she would benefit from the additional nutrition with protein  She has tried to increase her calorie intake and has meals on wheels which has not been effective with her weight gain.   She has a home care assistant to help with preparation of meals.   [name], FNP    THE PATIENT IS ENCOURAGED TO PRACTICE SOCIAL DISTANCING DUE TO THE COVID-19 PANDEMIC.

## 2020-01-23 ENCOUNTER — Telehealth: Payer: Self-pay | Admitting: Cardiology

## 2020-01-23 ENCOUNTER — Other Ambulatory Visit: Payer: Self-pay

## 2020-01-23 ENCOUNTER — Ambulatory Visit (INDEPENDENT_AMBULATORY_CARE_PROVIDER_SITE_OTHER): Payer: Medicaid Other

## 2020-01-23 DIAGNOSIS — Z23 Encounter for immunization: Secondary | ICD-10-CM | POA: Diagnosis not present

## 2020-01-23 NOTE — Progress Notes (Signed)
° °  Covid-19 Vaccination Clinic  Name:  Mada Sadik    MRN: [id number] DOB: 1955-08-22  01/23/2020  Ms. Rabanal was observed post Covid-19 immunization for 15 minutes without incident. She was provided with Vaccine Information Sheet and instruction to access the V-Safe system.   Ms. Sampley was instructed to call 911 with any severe reactions post vaccine:  Difficulty breathing   Swelling of face and throat   A fast heartbeat   A bad rash all over body   Dizziness and weakness   Immunizations Administered    Name Date Dose VIS Date Route   Moderna COVID-19 Vaccine 01/23/2020 12:52 PM 0.5 mL 07/2019 Intramuscular   Manufacturer: Levan Hurst   Lot: [id number]   [place]: 50518-335-82

## 2020-01-23 NOTE — Telephone Encounter (Signed)
No message needed °

## 2020-01-23 NOTE — Telephone Encounter (Signed)
Follow up   Patient's sister has a question about holding medication for this clearance. Please advise

## 2020-01-26 ENCOUNTER — Ambulatory Visit: Payer: Self-pay

## 2020-01-26 DIAGNOSIS — I10 Essential (primary) hypertension: Secondary | ICD-10-CM

## 2020-01-26 DIAGNOSIS — R634 Abnormal weight loss: Secondary | ICD-10-CM

## 2020-01-26 DIAGNOSIS — F32A Depression, unspecified: Secondary | ICD-10-CM

## 2020-01-26 NOTE — Chronic Care Management (AMB) (Signed)
Chronic Care Management    Social Work Follow Up Note  01/26/2020 Name: Kimberly [name] MRN: [id number] DOB: 12/02/1955  Kimberly [name] is a 64 y.o. year old female who is a primary care patient of Kimberly [name], Kimberly [name]. The CCM team was consulted for assistance with care coordination.   Review of patient status, including review of consultants reports, other relevant assessments, and collaboration with appropriate care team members and the patient's provider was performed as part of comprehensive patient evaluation and provision of chronic care management services.    SDOH (Social Determinants of Health) assessments performed: No    Outpatient Encounter Medications as of 01/26/2020  Medication Sig  . ASPIRIN ADULT LOW STRENGTH 81 MG EC tablet TAKE 1 TABLET BY MOUTH EVERY DAY (Patient taking differently: Take 81 mg by mouth daily. )  . atorvastatin (LIPITOR) 80 MG tablet TAKE 1 TABLET BY MOUTH DAILY (Patient not taking: Reported on 01/23/2020)  . benzonatate (TESSALON PERLES) 100 MG capsule Take 1 capsule (100 mg total) by mouth every 6 (six) hours as needed. (Patient not taking: Reported on 01/23/2020)  . Blood Pressure Monitoring (BLOOD PRESSURE KIT) DEVI Use as directed to check blood pressure daily dx: I10  . ELIQUIS 5 MG TABS tablet TAKE 1 TABLET BY MOUTH 2 TIMES DAILY (Patient taking differently: Take 5 mg by mouth 2 (two) times daily. )  . gabapentin (NEURONTIN) 100 MG capsule TAKE ONE CAPSULE 3 TIMES DAILY (Patient taking differently: TAKE ONE CAPSULE DAILY)  . loratadine (CLARITIN) 10 MG tablet Take 1 tablet (10 mg total) by mouth daily. (Patient not taking: Reported on 01/23/2020)  . MITIGARE 0.6 MG CAPS TAKE 1 CAPSULE BY MOUTH 2 TIMES DAILY (Patient taking differently: Take 0.6 mg by mouth in the morning and at bedtime. )  . omeprazole (PRILOSEC) 20 MG capsule TAKE 1 CAPSULE BY MOUTH EVERY DAY (Patient taking differently: Take 20 mg by mouth daily. )   No facility-administered  encounter medications on file as of 01/26/2020.     Goals Addressed            This Visit's Progress   . "we need to figure out why she is loosing weight"       Sister stated:  Kimberly [name] (see longitudinal plan of care for additional care plan information)  Current Barriers:  [name] Knowledge Deficits related to evaluation and treatment of abnormal weight loss   . Chronic Disease Management support and education needs related to Essential Hypertension, CVA, Depression, Abnormal weight loss   Nurse Case Manager Clinical Goal(s):  [name] Over the next 90 days, patient will work with PCP and GI to address needs related to evaluation and treatment of abnormal weight loss  Social Work [employer]): [name] Over the next 60 days the patient will work with SW to determine if her health plan will assist with costs of nutritional supplements due to abnormal weight loss   CCM SW Interventions: Completed 01/26/20 . Collaboration with patient primary care provider office . Confirmed [employer] [facility] application completion and submission to [employer] . SW scheduled follow up over the next 7 days  Completed 01/20/20 with the patient and her sister . Inter-disciplinary care team collaboration (see longitudinal plan of care) . Collaboration with RN Care Manager who reports abnormal weight loss equaling 18 pounds lost in the last 6+ months o RN Care Manager reports the patient is interested in adding Ensure nutritional supplement to daily intake but is unable to afford the cost . Performed  chart review to note payor source is [place] Medicaid . Accessed [employer] web page to note [employer] [facility] available to assist in exploring health plan coverage specific to each patient . Collaboration with Kimberly Brine, FNP to discuss program qualifications o Kimberly [name] reports she feels this patient would benefit from adding Ensure for nutritional supplementation . Provided Kimberly [name] with application to  [employer] . Successful outbound call placed to the patient and her sister to inform of plan to submit application on behalf of the patient  o The patient and her sister are in agreement of plan . Sw to follow up over the next 3 weeks  Patient Self Care Activities:  . Currently UNABLE TO independently self administer medications; unable to independently perform ADL's and IADL's   Please see past updates related to this goal by clicking on the "Past Updates" button in the selected goal          Follow Up Plan: SW will follow up with [employer] over the next 7 days to assess status of patient application.   Kimberly [name], BSW, CDP Social Worker, Certified Dementia Practitioner [place] [facility] [place] [facility] [phone]

## 2020-01-26 NOTE — Patient Instructions (Signed)
Social Worker Visit Information  Goals we discussed today:  Goals Addressed            This Visit's Progress    "we need to figure out why she is loosing weight"       Sister stated:  [address] (see longitudinal plan of care for additional care plan information)  Current Barriers:   Knowledge Deficits related to evaluation and treatment of abnormal weight loss    Chronic Disease Management support and education needs related to Essential Hypertension, CVA, Depression, Abnormal weight loss   Nurse Case Manager Clinical Goal(s):   Over the next 90 days, patient will work with PCP and GI to address needs related to evaluation and treatment of abnormal weight loss  Social Work [employer]):  Over the next 60 days the patient will work with SW to determine if her health plan will assist with costs of nutritional supplements due to abnormal weight loss   CCM SW Interventions: Completed 01/26/20  Collaboration with patient primary care provider office  Confirmed [employer] Program application completion and submission to [place] scheduled follow up over the next 7 days  Completed 01/20/20 with the patient and her sister  Inter-disciplinary care team collaboration (see longitudinal plan of care)  Collaboration with RN Care Manager who reports abnormal weight loss equaling 18 pounds lost in the last 6+ months o RN Care Manager reports the patient is interested in adding Ensure nutritional supplement to daily intake but is unable to afford the cost  Performed chart review to note payor source is [place] Medicaid  Accessed [employer] web page to note [place] available to assist in exploring health plan coverage specific to each patient  Collaboration with [name], FNP to discuss program qualifications o [name] reports she feels this patient would benefit from adding Ensure for nutritional supplementation  Provided [name] with  application to [employer]  Successful outbound call placed to the patient and her sister to inform of plan to submit application on behalf of the patient  o The patient and her sister are in agreement of plan  Sw to follow up over the next 3 weeks  Patient Self Care Activities:   Currently UNABLE TO independently self administer medications; unable to independently perform ADL's and IADL's   Please see past updates related to this goal by clicking on the "Past Updates" button in the selected goal          Follow Up Plan: SW will follow up with [employer] over the next 7 days   [name], BSW, CDP Social Worker, Certified Dementia Practitioner [place] [facility] [place] [facility] [phone]

## 2020-01-27 ENCOUNTER — Other Ambulatory Visit (HOSPITAL_COMMUNITY): Admission: RE | Admit: 2020-01-27 | Payer: Medicaid Other | Source: Ambulatory Visit

## 2020-01-28 ENCOUNTER — Ambulatory Visit: Payer: Medicaid Other

## 2020-01-28 ENCOUNTER — Other Ambulatory Visit: Payer: Self-pay

## 2020-01-28 VITALS — BP 122/82 | HR 82 | Temp 97.7°F | Ht 67.0 in | Wt 113.0 lb

## 2020-01-28 DIAGNOSIS — Z01812 Encounter for preprocedural laboratory examination: Secondary | ICD-10-CM

## 2020-01-28 NOTE — Progress Notes (Signed)
Pt presents today for COVID testing  

## 2020-01-29 LAB — NOVEL CORONAVIRUS, NAA: SARS-CoV-2, NAA: NOT DETECTED

## 2020-01-29 LAB — SARS-COV-2, NAA 2 DAY TAT

## 2020-01-30 ENCOUNTER — Encounter (HOSPITAL_COMMUNITY)
Admission: RE | Disposition: A | Discharge status: Home / Self Care | Payer: Self-pay | Source: Home / Self Care | Attending: Gastroenterology

## 2020-01-30 ENCOUNTER — Other Ambulatory Visit: Payer: Self-pay

## 2020-01-30 ENCOUNTER — Ambulatory Visit: Payer: Self-pay

## 2020-01-30 ENCOUNTER — Ambulatory Visit (HOSPITAL_COMMUNITY)
Admission: RE | Admit: 2020-01-30 | Discharge: 2020-01-30 | Disposition: A | Discharge status: Home / Self Care | Payer: Medicaid Other | Attending: Gastroenterology | Admitting: Gastroenterology

## 2020-01-30 ENCOUNTER — Ambulatory Visit (HOSPITAL_COMMUNITY): Payer: Medicaid Other | Admitting: Certified Registered Nurse Anesthetist

## 2020-01-30 DIAGNOSIS — K5520 Angiodysplasia of colon without hemorrhage: Secondary | ICD-10-CM | POA: Diagnosis not present

## 2020-01-30 DIAGNOSIS — I2510 Atherosclerotic heart disease of native coronary artery without angina pectoris: Secondary | ICD-10-CM | POA: Diagnosis not present

## 2020-01-30 DIAGNOSIS — Z951 Presence of aortocoronary bypass graft: Secondary | ICD-10-CM | POA: Insufficient documentation

## 2020-01-30 DIAGNOSIS — Z8673 Personal history of transient ischemic attack (TIA), and cerebral infarction without residual deficits: Secondary | ICD-10-CM | POA: Insufficient documentation

## 2020-01-30 DIAGNOSIS — Z1211 Encounter for screening for malignant neoplasm of colon: Secondary | ICD-10-CM | POA: Diagnosis present

## 2020-01-30 DIAGNOSIS — I10 Essential (primary) hypertension: Secondary | ICD-10-CM | POA: Diagnosis not present

## 2020-01-30 DIAGNOSIS — Z87891 Personal history of nicotine dependence: Secondary | ICD-10-CM | POA: Insufficient documentation

## 2020-01-30 DIAGNOSIS — K5730 Diverticulosis of large intestine without perforation or abscess without bleeding: Secondary | ICD-10-CM | POA: Insufficient documentation

## 2020-01-30 DIAGNOSIS — F32A Depression, unspecified: Secondary | ICD-10-CM

## 2020-01-30 DIAGNOSIS — E785 Hyperlipidemia, unspecified: Secondary | ICD-10-CM | POA: Insufficient documentation

## 2020-01-30 DIAGNOSIS — Z8249 Family history of ischemic heart disease and other diseases of the circulatory system: Secondary | ICD-10-CM | POA: Diagnosis not present

## 2020-01-30 DIAGNOSIS — D122 Benign neoplasm of ascending colon: Secondary | ICD-10-CM | POA: Diagnosis not present

## 2020-01-30 DIAGNOSIS — R634 Abnormal weight loss: Secondary | ICD-10-CM

## 2020-01-30 HISTORY — PX: POLYPECTOMY: SHX5525

## 2020-01-30 HISTORY — PX: COLONOSCOPY WITH PROPOFOL: SHX5780

## 2020-01-30 SURGERY — COLONOSCOPY WITH PROPOFOL
Anesthesia: Monitor Anesthesia Care

## 2020-01-30 MED ORDER — SODIUM CHLORIDE 0.9 % IV SOLN: INTRAVENOUS | Status: DC

## 2020-01-30 MED ORDER — LACTATED RINGERS IV SOLN: INTRAVENOUS | Status: DC | PRN

## 2020-01-30 MED ORDER — LIDOCAINE 2% (20 MG/ML) 5 ML SYRINGE
INTRAMUSCULAR | Status: DC | PRN
  Administered 2020-01-30: 80 mg via INTRAVENOUS

## 2020-01-30 MED ORDER — HYDRALAZINE HCL 20 MG/ML IJ SOLN
10.0000 mg | Freq: Once | INTRAMUSCULAR | Status: AC
  Administered 2020-01-30: 10 mg via INTRAVENOUS

## 2020-01-30 MED ORDER — HYDRALAZINE HCL 20 MG/ML IJ SOLN
INTRAMUSCULAR | Status: AC
  Filled 2020-01-30: qty 1

## 2020-01-30 MED ORDER — PROPOFOL 10 MG/ML IV BOLUS
INTRAVENOUS | Status: DC | PRN
  Administered 2020-01-30: 50 mg via INTRAVENOUS

## 2020-01-30 MED ORDER — PROPOFOL 500 MG/50ML IV EMUL
INTRAVENOUS | Status: DC | PRN
  Administered 2020-01-30: 100 ug/kg/min via INTRAVENOUS

## 2020-01-30 SURGICAL SUPPLY — 22 items

## 2020-01-30 NOTE — Op Note (Signed)
[id number] [facility] Patient Name: Kimberly [name] Procedure Date: 01/30/2020 MRN: [id number] Attending MD: Carol Ada , MD Date of Birth: [birth year] CSN: [id number] Age: 64 Admit Type: Outpatient Procedure:                Colonoscopy Indications:              High risk colon cancer surveillance: Personal                            history of colonic polyps Providers:                Carol Ada, MD, Angus Seller, Laverda Sorenson,                            Technician, Tyrone Apple, Technician, Eliberto Ivory, CRNA Referring MD:              Medicines:                Propofol per Anesthesia Complications:            No immediate complications. Estimated Blood Loss:     Estimated blood loss: none. Procedure:                Pre-Anesthesia Assessment:                           - Prior to the procedure, a History and Physical                            was performed, and patient medications and                            allergies were reviewed. The patient's tolerance of                            previous anesthesia was also reviewed. The risks                            and benefits of the procedure and the sedation                            options and risks were discussed with the patient.                            All questions were answered, and informed consent                            was obtained. Prior Anticoagulants: The patient has                            taken Plavix (clopidogrel), last dose was 3 days                            prior to procedure. ASA  Grade Assessment: III - A                            patient with severe systemic disease. After                            reviewing the risks and benefits, the patient was                            deemed in satisfactory condition to undergo the                            procedure.                           - Sedation was administered by an anesthesia                             professional. Deep sedation was attained.                           After obtaining informed consent, the colonoscope                            was passed under direct vision. Throughout the                            procedure, the patient's blood pressure, pulse, and                            oxygen saturations were monitored continuously. The                            CF-HQ190L ([id number] Olympus colonoscope was                            introduced through the anus and advanced to the the                            cecum, identified by appendiceal orifice and                            ileocecal valve. The colonoscopy was performed with                            moderate difficulty due to inadequate bowel prep                            and significant looping. Successful completion of                            the procedure was aided by using manual pressure,                            straightening and  shortening the scope to obtain                            bowel loop reduction and lavage. Scope In: 11:53:25 AM Scope Out: 12:25:19 PM Scope Withdrawal Time: 0 hours 19 minutes 10 seconds  Total Procedure Duration: 0 hours 31 minutes 54 seconds  Findings:      A 3 mm polyp was found in the ascending colon. The polyp was sessile.       The polyp was removed with a cold snare. Resection and retrieval were       complete.      Scattered small and large-mouthed diverticula were found in the sigmoid       colon.      A few small patchy angiodysplastic lesions without bleeding were found       in the cecum. Impression:               - One 3 mm polyp in the ascending colon, removed                            with a cold snare. Resected and retrieved.                           - Diverticulosis in the sigmoid colon.                           - A few non-bleeding colonic angiodysplastic                            lesions. Moderate Sedation:      Not Applicable - Patient had care per  Anesthesia. Recommendation:           - Patient has a contact number available for                            emergencies. The signs and symptoms of potential                            delayed complications were discussed with the                            patient. Return to normal activities tomorrow.                            Written discharge instructions were provided to the                            patient.                           - Resume previous diet.                           - Continue present medications.                           - Await pathology results.                           -  Repeat colonoscopy in 7 years for surveillance. Procedure Code(s):        --- Professional ---                           [phone], Colonoscopy, flexible; with removal of                            tumor(s), polyp(s), or other lesion(s) by snare                            technique Diagnosis Code(s):        --- Professional ---                           K63.5, Polyp of colon                           Z86.010, Personal history of colonic polyps                           K55.20, Angiodysplasia of colon without hemorrhage                           K57.30, Diverticulosis of large intestine without                            perforation or abscess without bleeding CPT copyright 2019 American Medical Association. All rights reserved. The codes documented in this report are preliminary and upon coder review may  be revised to meet current compliance requirements. Carol Ada, MD Carol Ada, MD 01/30/2020 12:35:38 PM This report has been signed electronically. Number of Addenda: 0

## 2020-01-30 NOTE — Transfer of Care (Signed)
Immediate Anesthesia Transfer of Care Note  Patient: Kimberly [name]  Procedure(s) Performed: Procedure(s): COLONOSCOPY WITH PROPOFOL (N/A) POLYPECTOMY  Patient Location: PACU and Endoscopy Unit  Anesthesia Type:MAC  Level of Consciousness: awake, alert  and oriented  Airway & Oxygen Therapy: Patient Spontanous Breathing and Patient connected to nasal cannula oxygen  Post-op Assessment: Report given to RN and Post -op Vital signs reviewed and stable  Post vital signs: Reviewed and stable  Last Vitals:  Vitals:   01/30/20 1129  BP: (!) 178/112  Pulse: 73  Resp: 17  Temp: 37.3 C  SpO2: 578%    Complications: No apparent anesthesia complications

## 2020-01-30 NOTE — Discharge Instructions (Signed)

## 2020-01-30 NOTE — H&P (Signed)
  Kimberly [name] HPI:  Her colonoscopy on 10/30/2013 was positive for seven adenomas. The sigmoid colon adenoma measured 1.5 cm and she was recommended a 3 year follow up. Since the last colonoscopy she suffered with a CVA and she required rehab in 2019. She is ambulatory with assistance. The patient was also started on clopidogreal an Eliquis. She cannot recall if she another intervention with her heart. The patient denies any issues with hematochezia, melena, diarrhea, or abdominal pain. Intermittently she will need to use magnesium citrate for constipation.   Past Medical History:  Diagnosis Date  . Bicipital tenosynovitis   . Coronary artery disease 05/2007   [id number]   . Fall 02/13/2018  . Family history of ischemic heart disease   . Gout   . Hammer toe   . Hip, thigh, leg, and ankle, insect bite, nonvenomous, without mention of infection(916.4)   . Hyperlipidemia   . Hypertension   . Other abnormality of red blood cells   . Rotator cuff syndrome of left shoulder   . Stroke [facility])     Past Surgical History:  Procedure Laterality Date  . CARDIAC CATHETERIZATION    . CORONARY ARTERY BYPASS GRAFT     triple  . CORONARY ARTERY BYPASS GRAFT  2008  . TUBAL LIGATION      Family History  Problem Relation Age of Onset  . Hypertension Mother   . Heart attack Father   . Diabetes Father   . Kidney failure Father   . Anxiety disorder Sister   . Sarcoidosis Sister     Social History:  reports that she quit smoking about 5 months ago. Her smoking use included cigarettes. She quit smokeless tobacco use about 11 years ago. She reports current alcohol use. She reports current drug use. Drug: Marijuana.  Allergies:  Allergies  Allergen Reactions  . Clonidine Derivatives Other (See Comments)    Patch causes scars  . Spironolactone Rash    Medications:  Scheduled:  Continuous: . sodium chloride      No results found for this or any previous visit (from the past 24  hour(s)).   No results found.  ROS:  As stated above in the HPI otherwise negative.  Height 5\' 7"  (1.702 m), weight 51.3 kg.    PE: Gen: NAD, Alert and Oriented HEENT:  [place]/AT, EOMI Neck: Supple, no LAD Lungs: CTA Bilaterally CV: RRR without M/G/R ABD: Soft, NTND, +BS Ext: No C/C/E  Assessment/Plan: 1) Personal history of polyps - Colonoscopy.  Kimberly [name] 01/30/2020, 11:22 AM

## 2020-01-30 NOTE — Anesthesia Postprocedure Evaluation (Signed)
Anesthesia Post Note  Patient: Kimberly [name]  Procedure(s) Performed: COLONOSCOPY WITH PROPOFOL (N/A ) POLYPECTOMY     Patient location during evaluation: PACU Anesthesia Type: MAC Level of consciousness: awake and alert Pain management: pain level controlled Vital Signs Assessment: post-procedure vital signs reviewed and stable Respiratory status: spontaneous breathing, nonlabored ventilation, respiratory function stable and patient connected to nasal cannula oxygen Cardiovascular status: stable and blood pressure returned to baseline Postop Assessment: no apparent nausea or vomiting Anesthetic complications: no   No complications documented.  Last Vitals:  Vitals:   01/30/20 1245 01/30/20 1250  BP: (!) 169/115 (!) 186/113  Pulse:  65  Resp: 10 19  Temp:    SpO2:  96%    Last Pain:  Vitals:   01/30/20 1231  TempSrc: Axillary  PainSc: 0-No pain                 [name]

## 2020-01-30 NOTE — Progress Notes (Signed)
Post procedure BP 170'/110". Dr Ambrose Pancoast notified, order received for Hydralazine 10 mg IV x 2 doses. BP remained elevated after the 2 doses were administered. Patient stated that she always has a hight BP. Dr. Ambrose Pancoast aware and gave permission  for patient to be discharged.

## 2020-01-30 NOTE — Anesthesia Preprocedure Evaluation (Addendum)
Anesthesia Evaluation  Patient identified by MRN, date of birth, ID band Patient awake    Reviewed: Allergy & Precautions, H&P , NPO status , Patient's Chart, lab work & pertinent test results, reviewed documented beta blocker date and time   Airway Mallampati: I  TM Distance: >3 FB Neck ROM: full    Dental no notable dental hx. (+) Teeth Intact, Dental Advisory Given   Pulmonary neg pulmonary ROS, former smoker,    Pulmonary exam normal breath sounds clear to auscultation       Cardiovascular Exercise Tolerance: Good hypertension, + CAD and + CABG   Rhythm:regular Rate:Normal     Neuro/Psych negative neurological ROS  negative psych ROS   GI/Hepatic negative GI ROS, Neg liver ROS,   Endo/Other  negative endocrine ROS  Renal/GU negative Renal ROS  negative genitourinary   Musculoskeletal   Abdominal   Peds  Hematology negative hematology ROS (+)   Anesthesia Other Findings   Reproductive/Obstetrics negative OB ROS                            Anesthesia Physical Anesthesia Plan  ASA: III  Anesthesia Plan: MAC   Post-op Pain Management:    Induction:   PONV Risk Score and Plan: 2  Airway Management Planned: Nasal Cannula, Simple Face Mask and Mask  Additional Equipment:   Intra-op Plan:   Post-operative Plan:   Informed Consent: I have reviewed the patients History and Physical, chart, labs and discussed the procedure including the risks, benefits and alternatives for the proposed anesthesia with the patient or authorized representative who has indicated his/her understanding and acceptance.     Dental Advisory Given  Plan Discussed with: CRNA and Anesthesiologist  Anesthesia Plan Comments:         Anesthesia Quick Evaluation

## 2020-01-30 NOTE — Chronic Care Management (AMB) (Signed)
  Care Management   Follow Up Note   01/30/2020 Name: Kimberly [name] MRN: [id number] DOB: [birth year]  Referred by: [name], FNP Reason for referral : Care Coordination   Kimberly [name] is a 64 y.o. year old female who is a primary care patient of [name], [place]. The care management team was consulted for assistance with care management and care coordination needs.    Review of patient status, including review of consultants reports, relevant laboratory and other test results, and collaboration with appropriate care team members and the patient's provider was performed as part of comprehensive patient evaluation and provision of chronic care management services.    SDOH (Social Determinants of Health) assessments performed: No See Care Plan activities for detailed interventions related to [facility])     Advanced Directives: See Care Plan and [employer] application for related entries.   Goals Addressed            This Visit's Progress   . "we need to figure out why she is loosing weight"       Sister stated:  [place] (see longitudinal plan of care for additional care plan information)  Current Barriers:  [name] Knowledge Deficits related to evaluation and treatment of abnormal weight loss   . Chronic Disease Management support and education needs related to Essential Hypertension, CVA, Depression, Abnormal weight loss   Nurse Case Manager Clinical Goal(s):  [name] Over the next 90 days, patient will work with PCP and GI to address needs related to evaluation and treatment of abnormal weight loss  Social Work [employer]): [name] Over the next 60 days the patient will work with SW to determine if her health plan will assist with costs of nutritional supplements due to abnormal weight loss   CCM SW Interventions: Completed 01/30/20 . Collaboration with [employer] [facility] to confirm patient application received . Determined the application was received, but not complete and  therefore has yet to be processed o Section 4 is incomplete due to not distinguishing which product the patient uses o Page two is missing both physician and patient signatures for consent . Informed the patient application is unable to move forward until items outlines above are corrected . Collaboration with patient primary provider office regarding completion of necessary items . SW to follow up over the next week  Patient Self Care Activities:  . Currently UNABLE TO independently self administer medications; unable to independently perform ADL's and IADL's   Please see past updates related to this goal by clicking on the "Past Updates" button in the selected goal          The care management team will reach out to the patient again over the next 10 days.   [name], BSW, CDP Social Worker, Certified Dementia Practitioner [place] [facility] [place] [facility] [phone]

## 2020-02-02 ENCOUNTER — Encounter (HOSPITAL_COMMUNITY): Payer: Self-pay | Admitting: Gastroenterology

## 2020-02-02 LAB — SURGICAL PATHOLOGY

## 2020-02-03 NOTE — Progress Notes (Signed)
Call patient to see if she has been checking her blood pressure since her procedure

## 2020-02-04 ENCOUNTER — Ambulatory Visit: Payer: Self-pay

## 2020-02-04 DIAGNOSIS — R634 Abnormal weight loss: Secondary | ICD-10-CM

## 2020-02-04 NOTE — Chronic Care Management (AMB) (Signed)
  Care Management   Follow Up Note   02/04/2020 Name: Kimberly [name] MRN: [id number] DOB: July 18, 1956  Referred by: [name], FNP Reason for referral : Care Coordination   Kimberly [name] is a 64 y.o. year old female who is a primary care patient of [name], [place]. The care management team was consulted for assistance with care management and care coordination needs.    Review of patient status, including review of consultants reports, relevant laboratory and other test results, and collaboration with appropriate care team members and the patient's provider was performed as part of comprehensive patient evaluation and provision of chronic care management services.    SDOH (Social Determinants of Health) assessments performed: No See Care Plan activities for detailed interventions related to [facility])     Advanced Directives: See Care Plan and Vynca application for related entries.   Goals Addressed            This Visit's Progress   . "we need to figure out why she is loosing weight"       Sister stated:  [place] (see longitudinal plan of care for additional care plan information)  Current Barriers:  [name] Knowledge Deficits related to evaluation and treatment of abnormal weight loss   . Chronic Disease Management support and education needs related to Essential Hypertension, CVA, Depression, Abnormal weight loss   Nurse Case Manager Clinical Goal(s):  [name] Over the next 90 days, patient will work with PCP and GI to address needs related to evaluation and treatment of abnormal weight loss  Social Work [employer]): [name] Over the next 60 days the patient will work with SW to determine if her health plan will assist with costs of nutritional supplements due to abnormal weight loss   CCM SW Interventions: Completed 02/04/20 . Collaboration with Primary provider office to determine updated form has not yet been submitted to [facility] due to patients provider being  on vacation x 1 week . Informed by CMA [name] she will inform SW when form is submitted . SW to continue to follow  Patient Self Care Activities:  . Currently UNABLE TO independently self administer medications; unable to independently perform ADL's and IADL's   Please see past updates related to this goal by clicking on the "Past Updates" button in the selected goal          SW to follow up on application status over the next two weeks.  [name], BSW, CDP Social Worker, Certified Dementia Practitioner [place] [facility] [place] [facility] [phone]

## 2020-02-12 ENCOUNTER — Other Ambulatory Visit: Payer: Self-pay | Admitting: Nurse Practitioner

## 2020-02-13 ENCOUNTER — Telehealth: Payer: Self-pay

## 2020-02-18 ENCOUNTER — Ambulatory Visit: Payer: Medicaid Other

## 2020-02-18 DIAGNOSIS — F32A Depression, unspecified: Secondary | ICD-10-CM

## 2020-02-18 DIAGNOSIS — R634 Abnormal weight loss: Secondary | ICD-10-CM

## 2020-02-18 DIAGNOSIS — I10 Essential (primary) hypertension: Secondary | ICD-10-CM

## 2020-02-18 NOTE — Chronic Care Management (AMB) (Signed)
Care Management    Social Work Follow Up Note  02/18/2020 Name: BREEA LONCAR MRN: [id number] DOB: March 21, 1956  Laurena Slimmer Barreira is a 64 y.o. year old female who is a primary care patient of [name], FNP. The CCM team was consulted for assistance with care coordination.   Review of patient status, including review of consultants reports, other relevant assessments, and collaboration with appropriate care team members and the patient's provider was performed as part of comprehensive patient evaluation and provision of chronic care management services.    SDOH (Social Determinants of Health) assessments performed: No    Outpatient Encounter Medications as of 02/18/2020  Medication Sig  . Blood Pressure Monitoring (BLOOD PRESSURE KIT) DEVI Use as directed to check blood pressure daily dx: I10  . ELIQUIS 5 MG TABS tablet TAKE 1 TABLET BY MOUTH 2 TIMES DAILY (Patient taking differently: Take 5 mg by mouth 2 (two) times daily. )  . gabapentin (NEURONTIN) 100 MG capsule TAKE ONE CAPSULE 3 TIMES DAILY (Patient taking differently: TAKE ONE CAPSULE DAILY)  . MITIGARE 0.6 MG CAPS TAKE 1 CAPSULE BY MOUTH 2 TIMES DAILY (Patient taking differently: Take 0.6 mg by mouth in the morning and at bedtime. )  . omeprazole (PRILOSEC) 20 MG capsule TAKE 1 CAPSULE BY MOUTH EVERY DAY  . QC LO-DOSE ASPIRIN 81 MG EC tablet TAKE 1 TABLET BY MOUTH EVERY DAY   No facility-administered encounter medications on file as of 02/18/2020.     Goals Addressed            This Visit's Progress   . "we need to figure out why she is loosing weight"   On track    Sister stated:  CARE PLAN ENTRY (see longitudinal plan of care for additional care plan information)  Current Barriers:  [name] Knowledge Deficits related to evaluation and treatment of abnormal weight loss   . Chronic Disease Management support and education needs related to Essential Hypertension, CVA, Depression, Abnormal weight loss   Nurse Case Manager  Clinical Goal(s):  [name] Over the next 90 days, patient will work with PCP and GI to address needs related to evaluation and treatment of abnormal weight loss  Social Work [employer]): [name] Over the next 60 days the patient will work with SW to determine if her health plan will assist with costs of nutritional supplements due to abnormal weight loss   CCM SW Interventions: Completed 02/18/20 . Outbound call placed to [facility] to assess status of patient application o Spoke with [facility] who reports patient application processed and prescription sent to dispensing facility "[facility]" o Jasmine reports dispensing facility will contact the patient to arrange delivery of nutritional supplements . Outbound call placed to the patients sister to confirm contacted by Jenetta Loges reports she has not yet been contacted o [employer] SW would outreach [facility] to assess status of delivery . Outbound call placed to [facility] [phone]) to follow up on patient delivery status o Voice message left for Holland Falling requesting a return call  Patient Self Care Activities:  . Currently UNABLE TO independently self administer medications; unable to independently perform ADL's and IADL's   Please see past updates related to this goal by clicking on the "Past Updates" button in the selected goal          Follow Up Plan: SW will follow up with patient by phone over the next week.  Bevelyn Ngo, BSW, CDP Social Worker, Scientist, water quality [facility]  Care Management [phone]

## 2020-02-20 ENCOUNTER — Ambulatory Visit (INDEPENDENT_AMBULATORY_CARE_PROVIDER_SITE_OTHER): Payer: Medicaid Other

## 2020-02-20 DIAGNOSIS — Z23 Encounter for immunization: Secondary | ICD-10-CM | POA: Diagnosis not present

## 2020-02-20 NOTE — Progress Notes (Signed)
   Covid-19 Vaccination Clinic  Name:  SOLOMIYA PASCALE    MRN: [id number] DOB: September 23, 1955  02/20/2020  Ms. Ashworth was observed post Covid-19 immunization for 15 minutes without incident. She was provided with Vaccine Information Sheet and instruction to access the V-Safe system.   Ms. Clegg was instructed to call 911 with any severe reactions post vaccine: [name] Difficulty breathing  . Swelling of face and throat  . A fast heartbeat  . A bad rash all over body  . Dizziness and weakness   Immunizations Administered    Name Date Dose VIS Date Route   Moderna COVID-19 Vaccine 02/20/2020 11:14 AM 0.5 mL 07/2019 Intramuscular   Manufacturer: Moderna   Lot: [id number]   Peters: 50388-828-00

## 2020-02-23 ENCOUNTER — Ambulatory Visit: Payer: Medicaid Other

## 2020-02-23 DIAGNOSIS — F32A Depression, unspecified: Secondary | ICD-10-CM

## 2020-02-23 DIAGNOSIS — R634 Abnormal weight loss: Secondary | ICD-10-CM

## 2020-02-23 DIAGNOSIS — I10 Essential (primary) hypertension: Secondary | ICD-10-CM

## 2020-02-23 NOTE — Patient Instructions (Signed)
Social Worker Visit Information  Goals we discussed today:  Goals Addressed            This Visit's Progress   . "we need to figure out why she is loosing weight"       Sister stated:  [place] (see longitudinal plan of care for additional care plan information)  Current Barriers:  Kimberly [name] Kitchen Knowledge Deficits related to evaluation and treatment of abnormal weight loss   . Chronic Disease Management support and education needs related to Essential Hypertension, CVA, Depression, Abnormal weight loss   Nurse Case Manager Clinical Goal(s):  Kimberly [name] Kitchen Over the next 90 days, patient will work with PCP and GI to address needs related to evaluation and treatment of abnormal weight loss  Social Work [employer]): Kimberly [name] Kitchen Over the next 60 days the patient will work with SW to determine if her health plan will assist with costs of nutritional supplements due to abnormal weight loss   CCM RN CM Interventions:  01/19/20 spoke to patient's sister Kimberly [name] . Inter-disciplinary care team collaboration (see longitudinal plan of care) . Evaluation of current treatment plan related to Abnormal weight loss and patient's adherence to plan as established by provider . Determined patient is scheduled for a Colonoscopy on 01/30/20 for evaluation of abnormal weight loss . Discussed Kimberly [name] has had an 18 weight loss since last November for an unknown reason . Discussed patient's recent Cardiology visit to establish how to hold her anticoagulants pre-operatively for the Colonscopy . Determined and reviewed with sister Kimberly [name] the following recommendations were provided per Cardiology; o  ASSESSMENT AND PLAN: o 1. Cardiology Pre-Operative Evaluation:Chart reviewed as part of pre-operative protocol coverage. Given past medical history and time since last visit, based on ACC/AHA guidelines, Kimberly [name] would be at acceptable risk for the planned procedure without further cardiovascular testing.  o I have spoken with Pharm.D.  onsite.  She may hold Eliquis for 24 hours prior to her GI procedure.  This would be on 01/29/2020.  Concerning Plavix we will hold this on 01/25/2020.  She will restart both medications ASAP after procedure dated 01/30/2020. o 2.  Paroxysmal atrial fibrillation: She is now in sinus rhythm with premature supraventricular complexes.  She continues on Eliquis.  Holding Eliquis as above for procedure.  She is not on any AV nodal blocking agents at this time.  Heart rate is well controlled. o 3.  History of CVA: This occurred in 2019.  I have reviewed this with Pharm.D. onsite.  As above hold Eliquis for 24 hours prior to procedure.  She will hold Plavix for 5 days prior to the procedure.  This is been explained to her and also dates are given. o 4.  Hypertension: Blood pressure slightly elevated today.  She will continue on amlodipine 10 mg daily, and Accupril 40 mg daily. o 5.  Hyperlipidemia: She remains on atorvastatin 80 mg daily.  Goal of LDL less than 70.  Follow-up labs per PCP. o Current medicines are reviewed at length with the patient today.  I have spent 45 minutes dedicated to the care of this patient on the date of this encounter to include pre-visit review of records, assessment, management and diagnostic testing,with shared decision making. . Assessed for patient's current meal plan at home, per sister Kimberly [name], the family prepares her meals and provides her with fresh fruits and vegetables and plenty of protein . Determined patient is not displaying GI symptoms, however, she has a poor appetite, her sister thinks this  is related to her depression  . Assessed for nutritional supplementation, currently, the patient is unable to afford to purchase Ensure or Boost, she will drink Ensure if she has it on hand . Sent in basket message to embedded BSW requesting f/u on resources to help cover the cost of Ensure . Discussed Kimberly [name] will outreach to [employer] to explore programs and or resources  to help with nutritional support . Sent in basket message to CMA Kimberly [name] requesting Ensure samples be provided to Kimberly [name] during her OV scheduled for 01/21/20, reply received that samples will be provided  . Discussed plans with patient for ongoing care management follow up and provided patient with direct contact information for care management team   CCM SW Interventions: Completed 02/23/20 . Outbound call placed to the patients sister, Kimberly [name], to determine she has yet to be contacted by [employer]  . Outbound call placed to [facility] ([phone]) to follow up on patient delivery status o Spoke with Kimberly [name] who reports unsuccessful calls have been placed to the patient several times o Discussed sister is the best point of contact o Informed by Kimberly [name] the patient does not qualify to receive Ensure under Medicaid benefit due to being over the age of 56 and without CAP services . Successful outbound call placed to [facility] to discuss patient denial of benefit o Determined the patients sister and daughter are assisting with the cost of Ensure at this time o Kimberly [name] the patient can use food and nutrition benefit to cover the cost- Kimberly [name] reports the patient does not receive enough assistance for this to be an option o Mailed the patient Ensure coupons to assist with cost savings . Scheduled follow up call over the next month  Patient Self Care Activities:  . Currently UNABLE TO independently self administer medications; unable to independently perform ADL's and IADL's   Please see past updates related to this goal by clicking on the "Past Updates" button in the selected goal          Follow Up Plan: SW will follow up with patient by phone over the next month.   [name], BSW, CDP Social Worker, Certified Dementia Practitioner [place] [facility] [place] [facility] [phone]

## 2020-02-23 NOTE — Chronic Care Management (AMB) (Signed)
Care Management    Social Work Follow Up Note  02/23/2020 Name: Kimberly [name] MRN: [id number] DOB: 1956/05/29  Kimberly [name] is a 64 y.o. year old female who is a primary care patient of Kimberly [name], Kimberly [name]. The CCM team was consulted for assistance with care coordination.   Review of patient status, including review of consultants reports, other relevant assessments, and collaboration with appropriate care team members and the patient's provider was performed as part of comprehensive patient evaluation and provision of chronic care management services.    SDOH (Social Determinants of Health) assessments performed: No    Outpatient Encounter Medications as of 02/23/2020  Medication Sig  . Blood Pressure Monitoring (BLOOD PRESSURE KIT) DEVI Use as directed to check blood pressure daily dx: I10  . ELIQUIS 5 MG TABS tablet TAKE 1 TABLET BY MOUTH 2 TIMES DAILY (Patient taking differently: Take 5 mg by mouth 2 (two) times daily. )  . gabapentin (NEURONTIN) 100 MG capsule TAKE ONE CAPSULE 3 TIMES DAILY (Patient taking differently: TAKE ONE CAPSULE DAILY)  . MITIGARE 0.6 MG CAPS TAKE 1 CAPSULE BY MOUTH 2 TIMES DAILY (Patient taking differently: Take 0.6 mg by mouth in the morning and at bedtime. )  . omeprazole (PRILOSEC) 20 MG capsule TAKE 1 CAPSULE BY MOUTH EVERY DAY  . QC LO-DOSE ASPIRIN 81 MG EC tablet TAKE 1 TABLET BY MOUTH EVERY DAY   No facility-administered encounter medications on file as of 02/23/2020.     Goals Addressed            This Visit's Progress   . "we need to figure out why she is loosing weight"       Sister stated:  [place] (see longitudinal plan of care for additional care plan information)  Current Barriers:  [name] Knowledge Deficits related to evaluation and treatment of abnormal weight loss   . Chronic Disease Management support and education needs related to Essential Hypertension, CVA, Depression, Abnormal weight loss   Nurse Case Manager  Clinical Goal(s):  [name] Over the next 90 days, patient will work with PCP and GI to address needs related to evaluation and treatment of abnormal weight loss  Social Work [employer]): [name] Over the next 60 days the patient will work with SW to determine if her health plan will assist with costs of nutritional supplements due to abnormal weight loss   CCM RN CM Interventions:  01/19/20 spoke to patient's sister Kimberly [name] . Inter-disciplinary care team collaboration (see longitudinal plan of care) . Evaluation of current treatment plan related to Abnormal weight loss and patient's adherence to plan as established by provider . Determined patient is scheduled for a Colonoscopy on 01/30/20 for evaluation of abnormal weight loss . Discussed [name] has had an 18 weight loss since last November for an unknown reason . Discussed patient's recent Cardiology visit to establish how to hold her anticoagulants pre-operatively for the Colonscopy . Determined and reviewed with sister Kimberly [name] the following recommendations were provided per Cardiology; o  ASSESSMENT AND PLAN: o 1. Cardiology Pre-Operative Evaluation:Chart reviewed as part of pre-operative protocol coverage. Given past medical history and time since last visit, based on ACC/AHA guidelines, Kimberly [name] would be at acceptable risk for the planned procedure without further cardiovascular testing.  o I have spoken with Pharm.D. onsite.  She may hold Eliquis for 24 hours prior to her GI procedure.  This would be on 01/29/2020.  Concerning Plavix we will hold this on 01/25/2020.  She will  restart both medications ASAP after procedure dated 01/30/2020. o 2.  Paroxysmal atrial fibrillation: She is now in sinus rhythm with premature supraventricular complexes.  She continues on Eliquis.  Holding Eliquis as above for procedure.  She is not on any AV nodal blocking agents at this time.  Heart rate is well controlled. o 3.  History of CVA: This occurred in 2019.  I have  reviewed this with Pharm.D. onsite.  As above hold Eliquis for 24 hours prior to procedure.  She will hold Plavix for 5 days prior to the procedure.  This is been explained to her and also dates are given. o 4.  Hypertension: Blood pressure slightly elevated today.  She will continue on amlodipine 10 mg daily, and Accupril 40 mg daily. o 5.  Hyperlipidemia: She remains on atorvastatin 80 mg daily.  Goal of LDL less than 70.  Follow-up labs per PCP. o Current medicines are reviewed at length with the patient today.  I have spent 45 minutes dedicated to the care of this patient on the date of this encounter to include pre-visit review of records, assessment, management and diagnostic testing,with shared decision making. . Assessed for patient's current meal plan at home, per sister Kimberly [name], the family prepares her meals and provides her with fresh fruits and vegetables and plenty of protein . Determined patient is not displaying GI symptoms, however, she has a poor appetite, her sister thinks this is related to her depression  . Assessed for nutritional supplementation, currently, the patient is unable to afford to purchase Ensure or Boost, she will drink Ensure if she has it on hand . Sent in basket message to embedded BSW requesting f/u on resources to help cover the cost of Ensure . Discussed Tillie Rung will outreach to [employer] to explore programs and or resources to help with nutritional support . Sent in basket message to CMA Tianna B requesting Ensure samples be provided to Ms. Jenny during her OV scheduled for 01/21/20, reply received that samples will be provided  . Discussed plans with patient for ongoing care management follow up and provided patient with direct contact information for care management team   CCM SW Interventions: Completed 02/23/20 . Outbound call placed to the patients sister, Kimberly [name], to determine she has yet to be contacted by [employer]  . Outbound call placed to [facility]  ([phone]) to follow up on patient delivery status o Spoke with Raquel Sarna who reports unsuccessful calls have been placed to the patient several times o Discussed sister is the best point of contact o Informed by Raquel Sarna the patient does not qualify to receive Ensure under Medicaid benefit due to being over the age of 63 and without CAP services . Successful outbound call placed to [facility] to discuss patient denial of benefit o Determined the patients sister and daughter are assisting with the cost of Ensure at this time o Tiffany Kocher the patient can use food and nutrition benefit to cover the cost- Kimberly [name] reports the patient does not receive enough assistance for this to be an option o Mailed the patient Ensure coupons to assist with cost savings . Scheduled follow up call over the next month  Patient Self Care Activities:  . Currently UNABLE TO independently self administer medications; unable to independently perform ADL's and IADL's   Please see past updates related to this goal by clicking on the "Past Updates" button in the selected goal          Follow Up Plan: SW will  follow up with patient by phone over the next month.   [name], BSW, CDP Social Worker, Certified Dementia Practitioner [place] [facility] [place] [facility] [phone]

## 2020-03-01 ENCOUNTER — Other Ambulatory Visit: Payer: Self-pay | Admitting: Nurse Practitioner

## 2020-03-02 ENCOUNTER — Encounter: Payer: Medicaid Other | Admitting: Nurse Practitioner

## 2020-03-09 NOTE — Progress Notes (Signed)
NEUROLOGY FOLLOW UP OFFICE NOTE  Kimberly [name] [id number]  HISTORY OF PRESENT ILLNESS: Kimberly [name] is a 66 year oldright-handed black woman with hypertension, hyperlipidemia, paroxysmal atrial fibrillation, coronary artery disease and prior history of strokes whofollows up for stroke.  UPDATE: Current medications: Eliquis; Lipitor 38m; ASA 837m gabapentin 10052maily  For neuropathic pain, she was started on gabapentin.  It helps a little bit.  Some days are good and some days are bad (such as when   LDL and HGB A1c from January were 54 and 5.5 respectively   HISTORY: [name] presenting as dizziness, fall and left hemianopia. She was previously on aspirin and Plavix. She was not given tPA due to outside therapeutic window. She was found to have new atrial fibrillation. CT of head revealed right acute to subacute PCA infarct. MRI of brain confirmed large acute right PCA territory infarct. CTA of head and neck showed bilateral ICA occlusions at origin, right PCA occlusion and basilar artery irregularity but bilateral MCAs patent from collaterals. Carotid doppler showed bilateral ICA occlusion with possible left ICA fresh clot. Transcranial doppler showed left ophthalmic artery and right ACA flow reversal consistent with bilateral carotid system occlusion. 2D echo showed EF 60-65%. LDL was 100. Hgb A1c was 5.8. She remained on ASA and Plavix. Due to large size of stroke, initiation of anticoagulation was postponed until 5 to 7 days post stroke to avoid hemorrhagic conversion. She was subsequently started on Eliquis. Plavix subsequently discontinued. She was started on Lipitor 90m90m She has residual left sided weakness and pain. She uses a walker. She uses a stress ball to strengthen her hand. Medicaid only approves 3 sessions a year of physical therapy. She smokes cigarettes (about 1 pack per  week)  PAST MEDICAL HISTORY: Past Medical History:  Diagnosis Date  . Bicipital tenosynovitis   . Coronary artery disease 05/2007   [id number]   . Fall 02/13/2018  . Family history of ischemic heart disease   . Gout   . Hammer toe   . Hip, thigh, leg, and ankle, insect bite, nonvenomous, without mention of infection(916.4)   . Hyperlipidemia   . Hypertension   . Other abnormality of red blood cells   . Rotator cuff syndrome of left shoulder   . [facility]  MEDICATIONS: Current Outpatient Medications on File Prior to Visit  Medication Sig Dispense Refill  . atorvastatin (LIPITOR) 80 MG tablet TAKE 1 TABLET BY MOUTH DAILY 90 tablet 1  . Blood Pressure Monitoring (BLOOD PRESSURE KIT) DEVI Use as directed to check blood pressure daily dx: I10 1 each 1  . ELIQUIS 5 MG TABS tablet TAKE 1 TABLET BY MOUTH 2 TIMES DAILY (Patient taking differently: Take 5 mg by mouth 2 (two) times daily. ) 180 tablet 3  . gabapentin (NEURONTIN) 100 MG capsule TAKE ONE CAPSULE 3 TIMES DAILY (Patient taking differently: TAKE ONE CAPSULE DAILY) 270 capsule 1  . MITIGARE 0.6 MG CAPS TAKE 1 CAPSULE BY MOUTH 2 TIMES DAILY (Patient taking differently: Take 0.6 mg by mouth in the morning and at bedtime. ) 180 capsule 1  . omeprazole (PRILOSEC) 20 MG capsule TAKE 1 CAPSULE BY MOUTH EVERY DAY 90 capsule 1  . QC LO-DOSE ASPIRIN 81 MG EC tablet TAKE 1 TABLET BY MOUTH EVERY DAY 90 tablet 1   No current facility-administered medications on file prior to visit.    ALLERGIES: Allergies  Allergen Reactions  . Clonidine Derivatives Other (See Comments)    Patch causes scars  . Spironolactone Rash    FAMILY HISTORY: Family History  Problem Relation Age of Onset  . Hypertension Mother   . Heart attack Father   . Diabetes Father   . Kidney failure Father   . Anxiety disorder Sister   . Sarcoidosis Sister     SOCIAL HISTORY: Social History   Socioeconomic History  . Marital status: Single    Spouse name:  Not on file  . Number of children: 2  . Years of education: Not on file  . Highest education level: 11th grade  Occupational History  . Occupation: retired  Tobacco Use  . Smoking status: Former Smoker    Types: Cigarettes    Quit date: 08/12/2019    Years since quitting: 0.5  . Smokeless tobacco: Former Systems developer    Quit date: 08/09/2008  . Tobacco comment: had assistance with the quit smoking - had patches and lozenges  Vaping Use  . Vaping Use: Never used  Substance and Sexual Activity  . Alcohol use: Yes    Comment: ocassionally wine  . Drug use: Yes    Types: Marijuana    Comment: history of cocaine use  . Sexual activity: Not on file  Other Topics Concern  . Not on file  Social History Narrative   Patient is right-handed. She lives alone in a one level home, 2 steps to enter. Her family is very active in her care.    Social Determinants of Health   Financial Resource Strain:   . Difficulty of Paying Living Expenses:   Food Insecurity:   . Worried About Charity fundraiser in the Last Year:   . Arboriculturist in the Last Year:   Transportation Needs:   . Film/video editor (Medical):   [name] Lack of Transportation (Non-Medical):   Physical Activity:   . Days of Exercise per Week:   . Minutes of Exercise per Session:   Stress:   . Feeling of Stress :   Social Connections:   . Frequency of Communication with Friends and Family:   . Frequency of Social Gatherings with Friends and Family:   . Attends Religious Services:   . Active Member of Clubs or Organizations:   . Attends Archivist Meetings:   [name] Marital Status:   Intimate Partner Violence:   . Fear of Current or Ex-Partner:   . Emotionally Abused:   [name] Physically Abused:   . Sexually Abused:     PHYSICAL EXAM: Blood pressure (!) 174/100, pulse 83, height 5' 7"  (1.702 m), weight 112 lb (50.8 kg), SpO2 98 %. General: No acute distress.  Patient appears well-groomed.   Head:   Normocephalic/atraumatic Eyes:  Fundi examined but not visualized Neck: supple, no paraspinal tenderness, full range of motion Heart:  Regular rate and rhythm Lungs:  Clear to auscultation bilaterally Back: No paraspinal tenderness Neurological Exam: alert and oriented to person, place, and time. Attention span and concentration intact, recent and remote memory intact, fund of knowledge intact.  Speech fluent and not dysarthric, language intact.  Left homonymous hemianopsia.  Otherwise, CN II-XII intact. Bulk and tone normal, muscle strength:  4+/5 left upper extremity except 3+/5 left hand grip; 4+/5 left lower extremity,, 5/5 right upper, 5-/5 right lower extremities.  Decreased pinprick andvibratory sensation on left, intact on right. Deep tendon reflexes 3+ on left, 2+ on right, toes downgoing.  Finger to  nose testing intact.  Uses upper strength to stand.  Significant difficulty maintaining balance to stand.  Hemiplegic gait.  Unsteady.  Requires assistance to ambulate.  IMPRESSION: 1.  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 2.  Paroxysmal atrial fibrillation with RVR 3.  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  Hypertension 5.  Hyperlipidemia 6.  Coronary artery disease 7.  Tobacco use disorder  PLAN: 1.  Secondary stroke prevention as managed by PCP/cardiology:  - Eliquis and ASA 28m daily   - Lipitor 864mdaily (LDL goal less than 70)  - Glycemic control (Hgb A1c goal less than 7)  - Blood pressure control.  Given the thrombosis of right PCA and bilateral carotid artery occlusions, blood pressure goal should be 130-160 to ensure adequate perfusion of collaterals.  - Tobacco cessation  - Mediterranean diet 2.  Gabapentin 10061m May take three times daily for pain if needed. 3.  Repeat carotid doppler 4.  Follow up in one year.  AdaMetta ClinesO  CC: JanMinette BrineNP

## 2020-03-10 ENCOUNTER — Ambulatory Visit (INDEPENDENT_AMBULATORY_CARE_PROVIDER_SITE_OTHER): Payer: Medicaid Other | Admitting: Neurology

## 2020-03-10 ENCOUNTER — Other Ambulatory Visit: Payer: Self-pay

## 2020-03-10 ENCOUNTER — Encounter: Payer: Self-pay | Admitting: Neurology

## 2020-03-10 VITALS — BP 174/100 | HR 83 | Ht 67.0 in | Wt 112.0 lb

## 2020-03-10 DIAGNOSIS — I6523 Occlusion and stenosis of bilateral carotid arteries: Secondary | ICD-10-CM

## 2020-03-10 DIAGNOSIS — I25810 Atherosclerosis of coronary artery bypass graft(s) without angina pectoris: Secondary | ICD-10-CM

## 2020-03-10 DIAGNOSIS — I480 Paroxysmal atrial fibrillation: Secondary | ICD-10-CM | POA: Diagnosis not present

## 2020-03-10 DIAGNOSIS — I69359 Hemiplegia and hemiparesis following cerebral infarction affecting unspecified side: Secondary | ICD-10-CM

## 2020-03-10 DIAGNOSIS — I63431 Cerebral infarction due to embolism of right posterior cerebral artery: Secondary | ICD-10-CM

## 2020-03-10 DIAGNOSIS — I10 Essential (primary) hypertension: Secondary | ICD-10-CM | POA: Diagnosis not present

## 2020-03-10 DIAGNOSIS — E785 Hyperlipidemia, unspecified: Secondary | ICD-10-CM

## 2020-03-10 DIAGNOSIS — H53462 Homonymous bilateral field defects, left side: Secondary | ICD-10-CM

## 2020-03-10 NOTE — Patient Instructions (Addendum)
1.  Continue Eliquis and aspirin 81mg  daily 2.  Continue atorvastatin 80mg  daily 3.  The top blood pressure number should be between 130 and 160 4.  Will check a carotid doppler, bilateral. We have sent a referral to [facility] for your carotid doppler  and they will call you directly to schedule your appointment. They are located at [place]. If you need to contact them directly please call [phone].  5.  For nerve pain, may take gabapentin 100mg  three times daily 6.  Follow up in one year.

## 2020-03-11 ENCOUNTER — Other Ambulatory Visit: Payer: Self-pay

## 2020-03-11 MED ORDER — AMLODIPINE BESYLATE 5 MG PO TABS
5.0000 mg | ORAL_TABLET | Freq: Every day | ORAL | 0 refills | Status: DC
Start: 2020-03-11 — End: 2020-03-18

## 2020-03-11 NOTE — Progress Notes (Signed)
Amlodipine 5mg  daily has been sent and appointment for pt has been made. YL,RMA

## 2020-03-17 NOTE — Progress Notes (Signed)
Cardiology Office Note   Date:  03/18/2020   ID:  Alonzo, Loving 1956-06-04, MRN [id number]  PCP:  [name], FNP  Cardiologist:   [name], MD   Chief Complaint  Patient presents with  . Chest Pain      History of Present Illness: Kimberly [name] is a 64 y.o. female who presents for follow up of CAD and CABG.  She had CVA with PCA subacute infarct in 2019.  Since I last saw her she has had progressive weight loss.  That is why we are seeing her recently because she was seen preprocedure prior to having colonoscopy.  There is no clear etiology apparently to the weight loss.  She does have depression which may be contributing and she is going to see a psychiatrist.  She gets around slowly with a walker and with canes.  She has had weakness following her stroke and balance issues.  She does get some mild shortness of breath with activity but this is at baseline.  She is not having any PND or orthopnea.  She has an occasional palpitations.  She has not had any presyncope or syncope.  She does have occasional chest discomfort.  This is midsternal.  It is mild to moderate in activity.  It comes on with rest.  She is not sure if this is similar to her previous angina.  She is not describing neck or arm discomfort.  She is not having any associated nausea vomiting or diaphoresis.    Past Medical History:  Diagnosis Date  . Bicipital tenosynovitis   . Coronary artery disease 05/2007   [id number]   . Fall 02/13/2018  . Family history of ischemic heart disease   . Gout   . Hammer toe   . Hip, thigh, leg, and ankle, insect bite, nonvenomous, without mention of infection(916.4)   . Hyperlipidemia   . Hypertension   . Other abnormality of red blood cells   . Rotator cuff syndrome of left shoulder   . Stroke [facility])     Past Surgical History:  Procedure Laterality Date  . CARDIAC CATHETERIZATION    . COLONOSCOPY WITH PROPOFOL N/A 01/30/2020   Procedure: COLONOSCOPY WITH  PROPOFOL;  Surgeon: Carol Ada, MD;  Location: [facility] ENDOSCOPY;  Service: Endoscopy;  Laterality: N/A;  . CORONARY ARTERY BYPASS GRAFT     triple  . CORONARY ARTERY BYPASS GRAFT  2008  . POLYPECTOMY  01/30/2020   Procedure: POLYPECTOMY;  Surgeon: Carol Ada, MD;  Location: [facility] ENDOSCOPY;  Service: Endoscopy;;  . TUBAL LIGATION       Current Outpatient Medications  Medication Sig Dispense Refill  . atorvastatin (LIPITOR) 80 MG tablet TAKE 1 TABLET BY MOUTH DAILY 90 tablet 1  . Blood Pressure Monitoring (BLOOD PRESSURE KIT) DEVI Use as directed to check blood pressure daily dx: I10 1 each 1  . ELIQUIS 5 MG TABS tablet TAKE 1 TABLET BY MOUTH 2 TIMES DAILY (Patient taking differently: Take 5 mg by mouth 2 (two) times daily. ) 180 tablet 3  . MITIGARE 0.6 MG CAPS TAKE 1 CAPSULE BY MOUTH 2 TIMES DAILY (Patient taking differently: Take 0.6 mg by mouth in the morning and at bedtime. ) 180 capsule 1  . omeprazole (PRILOSEC) 20 MG capsule TAKE 1 CAPSULE BY MOUTH EVERY DAY 90 capsule 1  . QC LO-DOSE ASPIRIN 81 MG EC tablet TAKE 1 TABLET BY MOUTH EVERY DAY 90 tablet 1  . amLODipine (NORVASC) 5 MG tablet Take  1&1/2 tablets 7.5 mg daily 135 tablet 3   No current facility-administered medications for this visit.    Allergies:   Clonidine derivatives and Spironolactone    ROS:  Please see the history of present illness.   Otherwise, review of systems are positive for none.   All other systems are reviewed and negative.    PHYSICAL EXAM: VS:  BP (!) 160/99   Pulse 72   Temp (!) 97.2 F (36.2 C)   Ht _0  (1.702 m)   Wt 112 lb 12.8 oz (51.2 kg)   SpO2 (!) 89%   BMI 17.67 kg/m  , BMI Body mass index is 17.67 kg/m. GEN:  No distress, thin and frail.  NECK:  No jugular venous distention at 90 degrees, waveform within normal limits, carotid upstroke brisk and symmetric, no bruits, no thyromegaly LYMPHATICS:  No cervical adenopathy LUNGS:  Clear to auscultation bilaterally BACK:  No CVA  tenderness CHEST:  Unremarkable HEART:  S1 and S2 within normal limits, no S3, no S4, no clicks, no rubs, no murmurs ABD:  Positive bowel sounds normal in frequency in pitch, no bruits, no rebound, no guarding, unable to assess midline mass or bruit with the patient seated. EXT:  2 plus pulses throughout, mild edema, no cyanosis no clubbing SKIN:  No rashes no nodules NEURO:  Cranial nerves II through XII grossly intact, motor grossly intact throughout PSYCH:  Cognitively intact, oriented to person place and time    EKG:  EKG is not ordered today. Of note she has previously had markedly abnormal EKG with LVH and repolarization changes with diffuse T wave inversions.   Recent Labs: 12/02/2019: ALT 30; BUN 10; Creatinine, Ser 0.80; Potassium 3.9; Sodium 145; TSH 1.550    Lipid Panel    Component Value Date/Time   CHOL 114 09/02/2019 1651   TRIG 58 09/02/2019 1651   HDL 47 09/02/2019 1651   CHOLHDL 2.4 09/02/2019 1651   CHOLHDL 3.6 02/14/2018 0314   VLDL 16 02/14/2018 0314   LDLCALC 54 09/02/2019 1651      Wt Readings from Last 3 Encounters:  03/18/20 112 lb 12.8 oz (51.2 kg)  03/10/20 112 lb (50.8 kg)  01/30/20 113 lb (51.3 kg)      Other studies Reviewed: Additional studies/ records that were reviewed today include: Labs. Review of the above records demonstrates:  Please see elsewhere in the note.     ASSESSMENT AND PLAN:  CAD:     She does have chest discomfort.  Somewhat atypical.  Her EKG would be relatively uninterpretable.  She needs to have stress testing but would not be able to walk on a treadmill.  Therefore, she will have a [employer].  HTN: Blood pressure is elevated.  She was just started on Norvasc but I am going to increase this to 7.5 mg daily.   CAROTID STENOSIS:  She had bilateral carotid occlusion .  She will have this managed medically.  PAF:  Ms. Lessa Huge has a CHA2DS2 - VASc score of 4.  She tolerates anticoagulation and will  continue with meds as listed.  She does have some mild anemia but this has been evaluated and there is no active bleeding apparently.  HYPERLIPIDEMIA:    LDL was 54 earlier this year.  No change in therapy.   TOBACCO ABUSE:     She still smoking cigarettes.  We talked about this and she has been unable to quit.   COVID EDUCATION: She has been vaccinated.  Current medicines are reviewed at length with the patient today.  The patient does not have concerns regarding medicines.  The following changes have been made:  no change  Labs/ tests ordered today include:   Orders Placed This Encounter  Procedures  . Myocardial Perfusion Imaging     Disposition:   FU with APP in six months or sooner if she has any increasing chest pain.   Signed, [name], MD  03/18/2020 12:17 PM    [facility]

## 2020-03-18 ENCOUNTER — Other Ambulatory Visit: Payer: Self-pay

## 2020-03-18 ENCOUNTER — Telehealth: Payer: Self-pay | Admitting: Cardiology

## 2020-03-18 ENCOUNTER — Encounter: Payer: Self-pay | Admitting: Cardiology

## 2020-03-18 ENCOUNTER — Ambulatory Visit (INDEPENDENT_AMBULATORY_CARE_PROVIDER_SITE_OTHER): Payer: Medicaid Other | Admitting: Cardiology

## 2020-03-18 VITALS — BP 160/99 | HR 72 | Temp 97.2°F | Ht 67.0 in | Wt 112.8 lb

## 2020-03-18 DIAGNOSIS — I6523 Occlusion and stenosis of bilateral carotid arteries: Secondary | ICD-10-CM | POA: Diagnosis not present

## 2020-03-18 DIAGNOSIS — I10 Essential (primary) hypertension: Secondary | ICD-10-CM

## 2020-03-18 DIAGNOSIS — I25810 Atherosclerosis of coronary artery bypass graft(s) without angina pectoris: Secondary | ICD-10-CM

## 2020-03-18 DIAGNOSIS — I2510 Atherosclerotic heart disease of native coronary artery without angina pectoris: Secondary | ICD-10-CM

## 2020-03-18 DIAGNOSIS — Z7189 Other specified counseling: Secondary | ICD-10-CM

## 2020-03-18 DIAGNOSIS — I6350 Cerebral infarction due to unspecified occlusion or stenosis of unspecified cerebral artery: Secondary | ICD-10-CM

## 2020-03-18 DIAGNOSIS — I480 Paroxysmal atrial fibrillation: Secondary | ICD-10-CM | POA: Diagnosis not present

## 2020-03-18 DIAGNOSIS — E785 Hyperlipidemia, unspecified: Secondary | ICD-10-CM

## 2020-03-18 DIAGNOSIS — R079 Chest pain, unspecified: Secondary | ICD-10-CM

## 2020-03-18 MED ORDER — AMLODIPINE BESYLATE 5 MG PO TABS
ORAL_TABLET | ORAL | 3 refills | Status: DC
Start: 2020-03-18 — End: 2020-08-22

## 2020-03-18 MED ORDER — AMLODIPINE BESYLATE 2.5 MG PO TABS
2.5000 mg | ORAL_TABLET | Freq: Every day | ORAL | 0 refills | Status: DC
Start: 2020-03-18 — End: 2020-03-30

## 2020-03-18 NOTE — Patient Instructions (Signed)
Medication Instructions:  Increase Amlodipine to 7.5 mg 1&1/2 tablets daily Continue all other medications *If you need a refill on your cardiac medications before your next appointment, please call your pharmacy*   Lab Work: None ordered    Testing/Procedures: Schedule Lexiscan Myoview   Follow-Up: At [facility], you and your health needs are our priority.  As part of our continuing mission to provide you with exceptional heart care, we have created designated Provider Care Teams.  These Care Teams include your primary Cardiologist (physician) and Advanced Practice Providers (APPs -  Physician Assistants and Nurse Practitioners) who all work together to provide you with the care you need, when you need it.  We recommend signing up for the patient portal called "MyChart".  Sign up information is provided on this After Visit Summary.  MyChart is used to connect with patients for Virtual Visits (Telemedicine).  Patients are able to view lab/test results, encounter notes, upcoming appointments, etc.  Non-urgent messages can be sent to your provider as well.   To learn more about what you can do with MyChart, go to [email or web address].    Your next appointment:  6 months  Call Dec to schedule Feb appointment    The format for your next appointment: Office     Provider:  Extender

## 2020-03-18 NOTE — Progress Notes (Signed)
[email or web address]  

## 2020-03-18 NOTE — Telephone Encounter (Signed)
Pt c/o medication issue:  1. Name of Medication: amLODipine (NORVASC) 5 MG tablet  2. How are you currently taking this medication (dosage and times per day)? N/A   3. Are you having a reaction (difficulty breathing--STAT)? N/A  4. What is your medication issue? Barnett Applebaum with [facility] is calling in regards to updated Rx for the medication. She states the patient's caregiver is not able to split tablet in half. Please return call to Barnett Applebaum to discuss at [phone].

## 2020-03-18 NOTE — Addendum Note (Signed)
Addended by: Kathyrn Lass on: 03/18/2020 12:19 PM   Modules accepted: Orders

## 2020-03-18 NOTE — Telephone Encounter (Signed)
[facility] to [facility] in regards to patients amlodipine prescription. Patients caregiver unable to split tablets in half. Pharmacy usually makes patients pill package every month and dosage was increased at todays visit which would require an additional 1/2 tablet. Pharmacist requested 2.5 mg tablet just for this month to be sent it for patient so that caregiver does not have to split tablet in half. Next month pharmacy will be able to split tablets for them in the patients pill package. 1 time 2.5 mg tablet order sent for pharmacy.

## 2020-03-23 ENCOUNTER — Other Ambulatory Visit: Payer: Medicaid Other

## 2020-03-23 ENCOUNTER — Telehealth: Payer: Medicaid Other

## 2020-03-23 ENCOUNTER — Ambulatory Visit
Admission: RE | Admit: 2020-03-23 | Discharge: 2020-03-23 | Disposition: A | Discharge status: Home / Self Care | Payer: Medicaid Other | Source: Ambulatory Visit | Attending: Neurology | Admitting: Neurology

## 2020-03-23 ENCOUNTER — Telehealth: Payer: Self-pay

## 2020-03-23 DIAGNOSIS — I6523 Occlusion and stenosis of bilateral carotid arteries: Secondary | ICD-10-CM

## 2020-03-23 NOTE — Telephone Encounter (Signed)
  Chronic Care Management   Outreach Note  03/23/2020 Name: Kimberly [name] MRN: [id number] DOB: [birth year]  Referred by: [name], FNP Reason for referral : Care Coordination   SW placed an unsuccessful outbound call to the patients sister and caregiver to assess goal progression. SW left a HIPAA compliant voice message requesting a return call.  Follow Up Plan: A HIPPA compliant phone message was left for the patient providing contact information and requesting a return call.  The care management team will reach out to the patient again over the next 10 days.   [name], BSW, CDP Social Worker, Certified Dementia Practitioner [facility] [phone]

## 2020-03-24 ENCOUNTER — Telehealth: Payer: Self-pay

## 2020-03-24 NOTE — Telephone Encounter (Signed)
Tried calling pt, No answer. No able to LVM

## 2020-03-24 NOTE — Telephone Encounter (Signed)
Pt returned our call, Per Pt please let her sister  [name] be the first person of contact for any of her results and medical needs or questions. The second is her Daughter Jeannett Senior in [place].   Advised pt sister and daughter of [place] results. Front desk is changing the main number to the pt sister's number in the chart.

## 2020-03-24 NOTE — Telephone Encounter (Signed)
-----   Message from Kimberly Berthold, DO sent at 03/24/2020  8:40 AM EDT ----- Please inform patient that US carotids show old findings of complete blockage of the carotid arteries which was seen in 2019.  No new findings. Thanks

## 2020-03-25 ENCOUNTER — Ambulatory Visit: Payer: Medicaid Other

## 2020-03-25 DIAGNOSIS — F329 Major depressive disorder, single episode, unspecified: Secondary | ICD-10-CM

## 2020-03-25 DIAGNOSIS — F32A Depression, unspecified: Secondary | ICD-10-CM

## 2020-03-25 DIAGNOSIS — R634 Abnormal weight loss: Secondary | ICD-10-CM

## 2020-03-25 NOTE — Patient Instructions (Signed)
Social Worker Visit Information  Goals we discussed today:  Goals Addressed            This Visit's Progress   . "to help her get control over her depression"       Sister stated   [place] (see longitudinal plan of care for additional care plan information)  Current Barriers:  [name] Knowledge Deficits related to evaluation and treatment of Depression  . Chronic Disease Management support and education needs related to Depression, Essential Hypertension, CVA, Abnormal Weight Loss   Nurse Case Manager Clinical Goal(s):  [name] Over the next 90 days, patient will work with the CCM team and PCP to address needs related to disease education and support to improve Self Health management of depression   CCM SW Interventions: Completed 03/25/20 with patient sister [name] . Inbound call received from the patients sister, Vinie Sill reports the patient continues to lose weight and report feelings of depression o Family attempts to engage patient by taking out for meals and to visit local parks at least two times per week o The patient continues to have a daily PCS caregiver for 2 hours each day . Determined the patient has yet to begin treatment for depression o [name] reports she contacted the [facility] and was informed they were not taking new patients at this time . [place] would request a new referral be sent on the patients behalf to an alternative agency . Collaboration with patient primary provider to request new orders  . Scheduled follow up call over the next two weeks to assess goal progression  CCM RN CM Interventions:  01/19/20 call completed with sister [name] . Inter-disciplinary care team collaboration (see longitudinal plan of care) . Evaluation of current treatment plan related to Depression and patient's adherence to plan as established by provider . Reviewed medications with patient and discussed patient is not currently taking an  anti-depressant due to PCP recommended talk therapy  . Determined PCP sent a Psychology referral at last visit in April; Determined Ms. Cressy nor sister [name] were contacted by the Psychologist to schedule a new patient appointment . Determined the referral was processed and sent to [facility]; Provided [name] the contact name/number and advised her to contact this provider ASAP in order to schedule a new patient appointment and to get services started . Determined currently Ms. Amble family alternates visiting with her, they prepare meals and take her for short drives . Reviewed upcoming appointment with PCP provider [name], FNP scheduled for 01/21/20 @11 :82 AM  . Discussed plans with patient for ongoing care management follow up and provided patient with direct contact information for care management team . Provided patient with printed educational materials related to Mood and Your Health Zone Safety Tool   Patient Self Care Activities:  . Currently UNABLE TO independently self administer medications; unable to independently perform ADL's and IADL's   Please see past updates related to this goal by clicking on the "Past Updates" button in the selected goal      . "we need to figure out why she is loosing weight"       Sister stated:  [place] (see longitudinal plan of care for additional care plan information)  Current Barriers:  [name] Knowledge Deficits related to evaluation and treatment of abnormal weight loss   . Chronic Disease Management support and education needs related to Essential Hypertension, CVA, Depression, Abnormal weight loss  Nurse Case Manager Clinical Goal(s):  [name] Over the next 90 days, patient will work with PCP and GI to address needs related to evaluation and treatment of abnormal weight loss  Social Work [employer]): [name] Over the next 60 days the patient will work with SW to determine if her health plan will assist with costs of nutritional  supplements due to abnormal weight loss   CCM RN CM Interventions:  01/19/20 spoke to patient's sister [name] . Inter-disciplinary care team collaboration (see longitudinal plan of care) . Evaluation of current treatment plan related to Abnormal weight loss and patient's adherence to plan as established by provider . Determined patient is scheduled for a Colonoscopy on 01/30/20 for evaluation of abnormal weight loss . Discussed Ms. Prajapati has had an 18 weight loss since last November for an unknown reason . Discussed patient's recent Cardiology visit to establish how to hold her anticoagulants pre-operatively for the Colonscopy . Determined and reviewed with sister [name] the following recommendations were provided per Cardiology; o  ASSESSMENT AND PLAN: o 1. Cardiology Pre-Operative Evaluation:Chart reviewed as part of pre-operative protocol coverage. Given past medical history and time since last visit, based on ACC/AHA guidelines, Joey Woodrum would be at acceptable risk for the planned procedure without further cardiovascular testing.  o I have spoken with Pharm.D. onsite.  She may hold Eliquis for 24 hours prior to her GI procedure.  This would be on 01/29/2020.  Concerning Plavix we will hold this on 01/25/2020.  She will restart both medications ASAP after procedure dated 01/30/2020. o 2.  Paroxysmal atrial fibrillation: She is now in sinus rhythm with premature supraventricular complexes.  She continues on Eliquis.  Holding Eliquis as above for procedure.  She is not on any AV nodal blocking agents at this time.  Heart rate is well controlled. o 3.  History of CVA: This occurred in 2019.  I have reviewed this with Pharm.D. onsite.  As above hold Eliquis for 24 hours prior to procedure.  She will hold Plavix for 5 days prior to the procedure.  This is been explained to her and also dates are given. o 4.  Hypertension: Blood pressure slightly elevated today.  She will continue on amlodipine 10 mg  daily, and Accupril 40 mg daily. o 5.  Hyperlipidemia: She remains on atorvastatin 80 mg daily.  Goal of LDL less than 70.  Follow-up labs per PCP. o Current medicines are reviewed at length with the patient today.  I have spent 45 minutes dedicated to the care of this patient on the date of this encounter to include pre-visit review of records, assessment, management and diagnostic testing,with shared decision making. . Assessed for patient's current meal plan at home, per sister [name], the family prepares her meals and provides her with fresh fruits and vegetables and plenty of protein . Determined patient is not displaying GI symptoms, however, she has a poor appetite, her sister thinks this is related to her depression  . Assessed for nutritional supplementation, currently, the patient is unable to afford to purchase Ensure or Boost, she will drink Ensure if she has it on hand . Sent in basket message to embedded BSW requesting f/u on resources to help cover the cost of Ensure . Discussed Tillie Rung will outreach to [employer] to explore programs and or resources to help with nutritional support . Sent in basket message to CMA Tianna B requesting Ensure samples be provided to Ms. Villarin during her OV scheduled for 01/21/20, reply received that  samples will be provided  . Discussed plans with patient for ongoing care management follow up and provided patient with direct contact information for care management team   CCM SW Interventions: Completed 03/25/20 with patient sister [name] . Inbound call received from the patient's sister, [name] . Determined the patient has not received Ensure coupons previously mailed out by SW . Mailed more coupons to the patient home address . Scheduled follow up over the next two weeks to confirm receipt   Completed 02/23/20 . Outbound call placed to the patients sister, [name], to determine she has yet to be contacted by [employer]  . Outbound call placed to  [facility] [phone]) to follow up on patient delivery status o Spoke with Raquel Sarna who reports unsuccessful calls have been placed to the patient several times o Discussed sister is the best point of contact o Informed by Raquel Sarna the patient does not qualify to receive Ensure under Medicaid benefit due to being over the age of 53 and without CAP services . Successful outbound call placed to [facility] to discuss patient denial of benefit o Determined the patients sister and daughter are assisting with the cost of Ensure at this time o Tiffany Kocher the patient can use food and nutrition benefit to cover the cost- [name] reports the patient does not receive enough assistance for this to be an option o Mailed the patient Ensure coupons to assist with cost savings . Scheduled follow up call over the next month  Patient Self Care Activities:  . Currently UNABLE TO independently self administer medications; unable to independently perform ADL's and IADL's   Please see past updates related to this goal by clicking on the "Past Updates" button in the selected goal          Materials Provided: Yes: provided Ensure coupons via mail  Follow Up Plan: SW will follow up with patient by phone over the next two weeks  [name], BSW, CDP Social Worker, Certified Dementia Practitioner [place] [facility] [place] [facility] [phone]

## 2020-03-25 NOTE — Chronic Care Management (AMB) (Signed)
Care Management   Follow Up Note   03/25/2020 Name: Kimberly [name] MRN: [id number] DOB: [birth year]  Referred by: Kimberly Brine, FNP Reason for referral : Care Coordination   Kimberly [name] is a 64 y.o. year old female who is a primary care patient of Kimberly [name], [place]. The care management team was consulted for assistance with care management and care coordination needs.    Review of patient status, including review of consultants reports, relevant laboratory and other test results, and collaboration with appropriate care team members and the patient's provider was performed as part of comprehensive patient evaluation and provision of chronic care management services.    SDOH (Social Determinants of Health) assessments performed: No See Care Plan activities for detailed interventions related to Kimberly [name])     Advanced Directives: See Care Plan and Vynca application for related entries.   Goals Addressed            This Visit's Progress   . "to help her get control over her depression"       Sister stated   Kimberly [name] (see longitudinal plan of care for additional care plan information)  Current Barriers:  [name] Knowledge Deficits related to evaluation and treatment of Depression  . Chronic Disease Management support and education needs related to Depression, Essential Hypertension, CVA, Abnormal Weight Loss   Nurse Case Manager Clinical Goal(s):  [name] Over the next 90 days, patient will work with the CCM team and PCP to address needs related to disease education and support to improve Self Health management of depression   CCM SW Interventions: Completed 03/25/20 with patient sister Kimberly [name] . Inbound call received from the patients sister, Kimberly [name] reports the patient continues to lose weight and report feelings of depression o Family attempts to engage patient by taking out for meals and to visit local parks at least two times per week o The patient continues to have a  daily PCS caregiver for 2 hours each day . Determined the patient has yet to begin treatment for depression o Kimberly [name] reports she contacted the Kimberly [name] and was informed they were not taking new patients at this time . Kimberly [name] would request a new referral be sent on the patients behalf to an alternative agency . Collaboration with patient primary provider to request new orders  . Scheduled follow up call over the next two weeks to assess goal progression  CCM RN CM Interventions:  01/19/20 call completed with sister Kimberly [name] . Inter-disciplinary care team collaboration (see longitudinal plan of care) . Evaluation of current treatment plan related to Depression and patient's adherence to plan as established by provider . Reviewed medications with patient and discussed patient is not currently taking an anti-depressant due to PCP recommended talk therapy  . Determined PCP sent a Psychology referral at last visit in April; Determined Ms. Leib nor sister Kimberly [name] were contacted by the Psychologist to schedule a new patient appointment . Determined the referral was processed and sent to Kimberly [name]; Provided Kimberly [name] the contact name/number and advised her to contact this provider ASAP in order to schedule a new patient appointment and to get services started . Determined currently Ms. Holderness family alternates visiting with her, they prepare meals and take her for short drives . Reviewed upcoming appointment with PCP provider Kimberly Brine, FNP scheduled for 01/21/20 @11 :44 AM  . Discussed plans with patient for ongoing care management follow up and provided patient with direct contact  information for care management team . Provided patient with printed educational materials related to Mood and Your Health Zone Safety Tool   Patient Self Care Activities:  . Currently UNABLE TO independently self administer medications; unable to independently perform ADL's and IADL's    Please see past updates related to this goal by clicking on the "Past Updates" button in the selected goal      . "we need to figure out why she is loosing weight"       Sister stated:  Kimberly [name] (see longitudinal plan of care for additional care plan information)  Current Barriers:  [name] Knowledge Deficits related to evaluation and treatment of abnormal weight loss   . Chronic Disease Management support and education needs related to Essential Hypertension, CVA, Depression, Abnormal weight loss   Nurse Case Manager Clinical Goal(s):  [name] Over the next 90 days, patient will work with PCP and GI to address needs related to evaluation and treatment of abnormal weight loss  Social Work [employer]): [name] Over the next 60 days the patient will work with SW to determine if her health plan will assist with costs of nutritional supplements due to abnormal weight loss   CCM RN CM Interventions:  01/19/20 spoke to patient's sister Kimberly [name] . Inter-disciplinary care team collaboration (see longitudinal plan of care) . Evaluation of current treatment plan related to Abnormal weight loss and patient's adherence to plan as established by provider . Determined patient is scheduled for a Colonoscopy on 01/30/20 for evaluation of abnormal weight loss . Discussed Ms. Stiggers has had an 18 weight loss since last November for an unknown reason . Discussed patient's recent Cardiology visit to establish how to hold her anticoagulants pre-operatively for the Colonscopy . Determined and reviewed with sister Kimberly [name] the following recommendations were provided per Cardiology; o  ASSESSMENT AND PLAN: o 1. Cardiology Pre-Operative Evaluation:Chart reviewed as part of pre-operative protocol coverage. Given past medical history and time since last visit, based on ACC/AHA guidelines, Trinty Lowy would be at acceptable risk for the planned procedure without further cardiovascular testing.  o I have spoken with Pharm.D.  onsite.  She may hold Eliquis for 24 hours prior to her GI procedure.  This would be on 01/29/2020.  Concerning Plavix we will hold this on 01/25/2020.  She will restart both medications ASAP after procedure dated 01/30/2020. o 2.  Paroxysmal atrial fibrillation: She is now in sinus rhythm with premature supraventricular complexes.  She continues on Eliquis.  Holding Eliquis as above for procedure.  She is not on any AV nodal blocking agents at this time.  Heart rate is well controlled. o 3.  History of CVA: This occurred in 2019.  I have reviewed this with Pharm.D. onsite.  As above hold Eliquis for 24 hours prior to procedure.  She will hold Plavix for 5 days prior to the procedure.  This is been explained to her and also dates are given. o 4.  Hypertension: Blood pressure slightly elevated today.  She will continue on amlodipine 10 mg daily, and Accupril 40 mg daily. o 5.  Hyperlipidemia: She remains on atorvastatin 80 mg daily.  Goal of LDL less than 70.  Follow-up labs per PCP. o Current medicines are reviewed at length with the patient today.  I have spent 45 minutes dedicated to the care of this patient on the date of this encounter to include pre-visit review of records, assessment, management and diagnostic testing,with shared decision making. . Assessed for patient's current  meal plan at home, per sister Kimberly [name], the family prepares her meals and provides her with fresh fruits and vegetables and plenty of protein . Determined patient is not displaying GI symptoms, however, she has a poor appetite, her sister thinks this is related to her depression  . Assessed for nutritional supplementation, currently, the patient is unable to afford to purchase Ensure or Boost, she will drink Ensure if she has it on hand . Sent in basket message to embedded BSW requesting f/u on resources to help cover the cost of Ensure . Discussed Tillie Rung will outreach to [employer] to explore programs and or resources  to help with nutritional support . Sent in basket message to CMA Tianna B requesting Ensure samples be provided to Ms. Fiorini during her OV scheduled for 01/21/20, reply received that samples will be provided  . Discussed plans with patient for ongoing care management follow up and provided patient with direct contact information for care management team   CCM SW Interventions: Completed 03/25/20 with patient sister Kimberly [name] . Inbound call received from the patient's sister, Kimberly [name] . Determined the patient has not received Ensure coupons previously mailed out by SW . Mailed more coupons to the patient home address . Scheduled follow up over the next two weeks to confirm receipt   Completed 02/23/20 . Outbound call placed to the patients sister, Kimberly [name], to determine she has yet to be contacted by [employer]  . Outbound call placed to [facility] [phone]) to follow up on patient delivery status o Spoke with Raquel Sarna who reports unsuccessful calls have been placed to the patient several times o Discussed sister is the best point of contact o Informed by Raquel Sarna the patient does not qualify to receive Ensure under Medicaid benefit due to being over the age of 60 and without CAP services . Successful outbound call placed to [facility] to discuss patient denial of benefit o Determined the patients sister and daughter are assisting with the cost of Ensure at this time o Tiffany Kocher the patient can use food and nutrition benefit to cover the cost- Kimberly [name] reports the patient does not receive enough assistance for this to be an option o Mailed the patient Ensure coupons to assist with cost savings . Scheduled follow up call over the next month  Patient Self Care Activities:  . Currently UNABLE TO independently self administer medications; unable to independently perform ADL's and IADL's   Please see past updates related to this goal by clicking on the "Past Updates" button in the selected goal          SW will  follow up with the patient over the next two weeks.  [name], BSW, CDP Social Worker, Certified Dementia Practitioner [place] [facility] [place] [facility] [phone]

## 2020-03-26 ENCOUNTER — Telehealth: Payer: Self-pay

## 2020-03-29 ENCOUNTER — Other Ambulatory Visit: Payer: Self-pay | Admitting: Cardiology

## 2020-03-29 ENCOUNTER — Telehealth: Payer: Medicaid Other

## 2020-03-30 ENCOUNTER — Telehealth (HOSPITAL_COMMUNITY): Payer: Self-pay

## 2020-03-30 ENCOUNTER — Encounter: Payer: Self-pay | Admitting: Nurse Practitioner

## 2020-03-30 ENCOUNTER — Ambulatory Visit (INDEPENDENT_AMBULATORY_CARE_PROVIDER_SITE_OTHER): Payer: Medicaid Other | Admitting: Nurse Practitioner

## 2020-03-30 ENCOUNTER — Other Ambulatory Visit: Payer: Self-pay

## 2020-03-30 VITALS — BP 132/88 | HR 70 | Temp 97.7°F

## 2020-03-30 DIAGNOSIS — R5383 Other fatigue: Secondary | ICD-10-CM | POA: Diagnosis not present

## 2020-03-30 DIAGNOSIS — F32A Depression, unspecified: Secondary | ICD-10-CM

## 2020-03-30 DIAGNOSIS — F329 Major depressive disorder, single episode, unspecified: Secondary | ICD-10-CM | POA: Diagnosis not present

## 2020-03-30 DIAGNOSIS — I10 Essential (primary) hypertension: Secondary | ICD-10-CM

## 2020-03-30 DIAGNOSIS — Z720 Tobacco use: Secondary | ICD-10-CM | POA: Diagnosis not present

## 2020-03-30 DIAGNOSIS — R2681 Unsteadiness on feet: Secondary | ICD-10-CM

## 2020-03-30 MED ORDER — CITALOPRAM HYDROBROMIDE 10 MG PO TABS: 10.0000 mg | ORAL_TABLET | Freq: Every day | ORAL | 2 refills | Status: DC

## 2020-03-30 NOTE — Telephone Encounter (Signed)
Encounter complete. 

## 2020-03-30 NOTE — Progress Notes (Signed)
I,Kimberly [name] [employer] as a Education administrator for [employer], FNP.,have documented all relevant documentation on the behalf of Kimberly Brine, FNP,as directed by  Kimberly Brine, FNP while in the presence of Kimberly [name], Kimberly [name].  This visit occurred during the SARS-CoV-2 public health emergency.  Safety protocols were in place, including screening questions prior to the visit, additional usage of staff PPE, and extensive cleaning of exam room while observing appropriate contact time as indicated for disinfecting solutions.  Subjective:     Patient ID: Kimberly [name] , female    DOB: October 10, 1955 , 64 y.o.   MRN: [id number]   Chief Complaint  Patient presents with  . Hypertension    HPI  She is trying her best to eat peppermint patties and hard peppermint.    Hypertension This is a chronic problem. The current episode started more than 1 year ago. The problem is controlled. Pertinent negatives include no anxiety, chest pain, headaches, malaise/fatigue, palpitations or shortness of breath. Risk factors for coronary artery disease include smoking/tobacco exposure. Past treatments include calcium channel blockers. There are no compliance problems.  Hypertensive end-organ damage includes CVA. There is no history of angina or kidney disease. There is no history of chronic renal disease.     Past Medical History:  Diagnosis Date  . Bicipital tenosynovitis   . Coronary artery disease 05/2007   [id number]   . Fall 02/13/2018  . Family history of ischemic heart disease   . Gout   . Hammer toe   . Hip, thigh, leg, and ankle, insect bite, nonvenomous, without mention of infection(916.4)   . Hyperlipidemia   . Hypertension   . Other abnormality of red blood cells   . Rotator cuff syndrome of left shoulder   . Stroke [facility])      Family History  Problem Relation Age of Onset  . Hypertension Mother   . Heart attack Father   . Diabetes Father   . Kidney failure Father   . Anxiety disorder Sister    . Sarcoidosis Sister      Current Outpatient Medications:  .  amLODipine (NORVASC) 5 MG tablet, Take 1&1/2 tablets 7.5 mg daily, Disp: 135 tablet, Rfl: 3 .  atorvastatin (LIPITOR) 80 MG tablet, TAKE 1 TABLET BY MOUTH DAILY, Disp: 90 tablet, Rfl: 1 .  Blood Pressure Monitoring (BLOOD PRESSURE KIT) DEVI, Use as directed to check blood pressure daily dx: I10, Disp: 1 each, Rfl: 1 .  ELIQUIS 5 MG TABS tablet, TAKE 1 TABLET BY MOUTH 2 TIMES DAILY (Patient taking differently: Take 5 mg by mouth 2 (two) times daily. ), Disp: 180 tablet, Rfl: 3 .  MITIGARE 0.6 MG CAPS, TAKE 1 CAPSULE BY MOUTH 2 TIMES DAILY (Patient taking differently: Take 0.6 mg by mouth in the morning and at bedtime. ), Disp: 180 capsule, Rfl: 1 .  omeprazole (PRILOSEC) 20 MG capsule, TAKE 1 CAPSULE BY MOUTH EVERY DAY, Disp: 90 capsule, Rfl: 1 .  QC LO-DOSE ASPIRIN 81 MG EC tablet, TAKE 1 TABLET BY MOUTH EVERY DAY, Disp: 90 tablet, Rfl: 1 .  citalopram (CELEXA) 10 MG tablet, Take 1 tablet (10 mg total) by mouth daily., Disp: 30 tablet, Rfl: 2   Allergies  Allergen Reactions  . Clonidine Derivatives Other (See Comments)    Patch causes scars  . Spironolactone Rash     Review of Systems  Constitutional: Negative.  Negative for malaise/fatigue.  Respiratory: Negative for shortness of breath and wheezing.   Cardiovascular: Negative.  Negative for chest  pain, palpitations and leg swelling.  Gastrointestinal: Negative.   Neurological: Negative for dizziness and headaches.  Psychiatric/Behavioral: Negative.      Today's Vitals   03/30/20 1101  BP: 132/88  Pulse: 70  Temp: 97.7 F (36.5 C)  TempSrc: Oral  PainSc: 0-No pain   There is no height or weight on file to calculate BMI.   Objective:  Physical Exam Constitutional:      General: She is not in acute distress.    Appearance: Normal appearance. She is normal weight.  Cardiovascular:     Rate and Rhythm: Normal rate and regular rhythm.     Pulses: Normal  pulses.     Heart sounds: Normal heart sounds.  Pulmonary:     Effort: Pulmonary effort is normal.     Breath sounds: Normal breath sounds.  Musculoskeletal:        General: Deformity (left upper and lower extremity ) present. No tenderness.  Skin:    General: Skin is warm and dry.     Capillary Refill: Capillary refill takes less than 2 seconds.  Neurological:     General: No focal deficit present.     Mental Status: She is alert and oriented to person, place, and time.     Cranial Nerves: No cranial nerve deficit.     Motor: No weakness.  Psychiatric:        Mood and Affect: Mood normal.        Behavior: Behavior normal.        Thought Content: Thought content normal.        Judgment: Judgment normal.         Assessment And Plan:     1. Essential hypertension  Chronic, fair control  She is to continue with the medication Cardiology restarted - BMP8+eGFR  2. Tobacco abuse - CT CHEST LUNG CA SCREEN LOW DOSE W/O CM; Future  3. Depression, unspecified depression type  She is willing to try an antidepressant  She is to return in 4-6 weeks to make sure is effective - citalopram (CELEXA) 10 MG tablet; Take 1 tablet (10 mg total) by mouth daily.  Dispense: 30 tablet; Refill: 2  4. Fatigue, unspecified type  Will check for metabolic causes  I also discussed with her to avoid going to bed at 7p this could be causing her problems with sleeping - Vitamin B12  5. Gait instability  She is having multiple near falls and needs PT for strengthening and balance - Ambulatory referral to [facility]  Her sister is to call the psychiatrist as well.       Patient was given opportunity to ask questions. Patient verbalized understanding of the plan and was able to repeat key elements of the plan. All questions were answered to their satisfaction.  Kimberly Brine, FNP   I, Kimberly Brine, FNP, have reviewed all documentation for this visit. The documentation on 04/04/20 for the  exam, diagnosis, procedures, and orders are all accurate and complete.  THE PATIENT IS ENCOURAGED TO PRACTICE SOCIAL DISTANCING DUE TO THE COVID-19 PANDEMIC.

## 2020-03-31 ENCOUNTER — Telehealth (HOSPITAL_COMMUNITY): Payer: Self-pay

## 2020-03-31 LAB — BMP8+EGFR
BUN/Creatinine Ratio: 16 (ref 12–28)
BUN: 13 mg/dL (ref 8–27)
CO2: 20 mmol/L (ref 20–29)
Calcium: 9.7 mg/dL (ref 8.7–10.3)
Chloride: 106 mmol/L (ref 96–106)
Creatinine, Ser: 0.82 mg/dL (ref 0.57–1.00)
GFR calc Af Amer: 88 mL/min/{1.73_m2} (ref >59)
GFR calc non Af Amer: 76 mL/min/{1.73_m2} (ref >59)
Glucose: 62 mg/dL — ABNORMAL LOW (ref 65–99)
Potassium: 4 mmol/L (ref 3.5–5.2)
Sodium: 144 mmol/L (ref 134–144)

## 2020-03-31 LAB — VITAMIN B12: Vitamin B-12: 627 pg/mL (ref 232–1245)

## 2020-03-31 NOTE — Telephone Encounter (Signed)
Encounter complete. 

## 2020-04-01 ENCOUNTER — Ambulatory Visit (HOSPITAL_COMMUNITY)
Admission: RE | Admit: 2020-04-01 | Discharge: 2020-04-01 | Disposition: A | Discharge status: Home / Self Care | Payer: Medicaid Other | Source: Ambulatory Visit | Attending: Cardiology | Admitting: Cardiology

## 2020-04-01 ENCOUNTER — Other Ambulatory Visit: Payer: Self-pay

## 2020-04-01 DIAGNOSIS — I2510 Atherosclerotic heart disease of native coronary artery without angina pectoris: Secondary | ICD-10-CM | POA: Diagnosis present

## 2020-04-01 LAB — MYOCARDIAL PERFUSION IMAGING
Peak HR: 125 {beats}/min
Rest HR: 113 {beats}/min
SDS: 5
SRS: 12
SSS: 17
TID: 1.24

## 2020-04-01 MED ORDER — TECHNETIUM TC 99M TETROFOSMIN IV KIT
30.4000 | PACK | Freq: Once | INTRAVENOUS | Status: AC | PRN
  Administered 2020-04-01: 30.4 via INTRAVENOUS
  Filled 2020-04-01: qty 31

## 2020-04-01 MED ORDER — REGADENOSON 0.4 MG/5ML IV SOLN
0.4000 mg | Freq: Once | INTRAVENOUS | Status: AC
  Administered 2020-04-01: 0.4 mg via INTRAVENOUS

## 2020-04-01 MED ORDER — AMINOPHYLLINE 25 MG/ML IV SOLN
75.0000 mg | Freq: Once | INTRAVENOUS | Status: AC
  Administered 2020-04-01: 75 mg via INTRAVENOUS

## 2020-04-01 MED ORDER — TECHNETIUM TC 99M TETROFOSMIN IV KIT
10.5000 | PACK | Freq: Once | INTRAVENOUS | Status: AC | PRN
  Administered 2020-04-01: 10.5 via INTRAVENOUS
  Filled 2020-04-01: qty 11

## 2020-04-06 ENCOUNTER — Ambulatory Visit: Payer: Medicaid Other

## 2020-04-06 DIAGNOSIS — I10 Essential (primary) hypertension: Secondary | ICD-10-CM

## 2020-04-06 DIAGNOSIS — F32A Depression, unspecified: Secondary | ICD-10-CM

## 2020-04-06 NOTE — Chronic Care Management (AMB) (Signed)
Care Management   Follow Up Note   04/06/2020 Name: ZULEIMA HASER MRN: [id number] DOB: [birth year]  Referred by: [name], FNP Reason for referral : Care Coordination   KERIANN RANKIN is a 64 y.o. year old female who is a primary care patient of [name], [place]. The care management team was consulted for assistance with care management and care coordination needs.    Review of patient status, including review of consultants reports, relevant laboratory and other test results, and collaboration with appropriate care team members and the patient's provider was performed as part of comprehensive patient evaluation and provision of chronic care management services.    SDOH (Social Determinants of Health) assessments performed: No See Care Plan activities for detailed interventions related to [facility])     Advanced Directives: See Care Plan and Vynca application for related entries.   Goals Addressed            This Visit's Progress   . "to help her get control over her depression"   Not on track    Sister stated   [place] (see longitudinal plan of care for additional care plan information)  Current Barriers:  [name] Knowledge Deficits related to evaluation and treatment of Depression  . Chronic Disease Management support and education needs related to Depression, Essential Hypertension, CVA, Abnormal Weight Loss   Nurse Case Manager Clinical Goal(s):  [name] Over the next 90 days, patient will work with the CCM team and PCP to address needs related to disease education and support to improve Self Health management of depression   CCM SW Interventions: Completed 04/06/20 with [name] . Performed chart review to note patient seen in office 8/17 by primary provider, [name] FNP o [name] documentation indicates new orders of Citalopram 10 mg PO daily  o Patient and her sister encouraged to contact psychiatrist to schedule a new patient appointment . Successful  outbound call placed to [facility] to assess goal progression o [name] reports she has yet to contact psychiatrist on behalf of the patient o Encouraged [name] to contact psych provider ASAP in order to start treatment of depression . Determined Loreli Slot has yet to pick up new medication stating "they didn't give her new medication. They want her to start therapy first" o Reviewed OV note with [name] which indicated the patient would be started on Citalopram o [place] would request follow up from the patients clinical team to review plan to treat depression . Collaboration with Fransisco Beau, CMA to request follow up with the patient regarding if [name] intends for the patient to begin medication at this time . Collaboration with RN Care Manager to update on goal progression  Completed 03/25/20 with patient sister [name] . Inbound call received from the patients sister, Vinie Sill reports the patient continues to lose weight and report feelings of depression o Family attempts to engage patient by taking out for meals and to visit local parks at least two times per week o The patient continues to have a daily PCS caregiver for 2 hours each day . Determined the patient has yet to begin treatment for depression o [name] reports she contacted the [facility] and was informed they were not taking new patients at this time . [place] would request a new referral be sent on the patients behalf to an alternative agency . Collaboration with patient primary provider to request new orders  . Scheduled follow up call over  the next two weeks to assess goal progression  CCM RN CM Interventions:  01/19/20 call completed with sister [name] . Inter-disciplinary care team collaboration (see longitudinal plan of care) . Evaluation of current treatment plan related to Depression and patient's adherence to plan as established by provider . Reviewed medications with patient and discussed  patient is not currently taking an anti-depressant due to PCP recommended talk therapy  . Determined PCP sent a Psychology referral at last visit in April; Determined Ms. Bellissimo nor sister [name] were contacted by the Psychologist to schedule a new patient appointment . Determined the referral was processed and sent to [facility]; Provided [name] the contact name/number and advised her to contact this provider ASAP in order to schedule a new patient appointment and to get services started . Determined currently Ms. Fazzino family alternates visiting with her, they prepare meals and take her for short drives . Reviewed upcoming appointment with PCP provider [name], FNP scheduled for 01/21/20 @11 :86 AM  . Discussed plans with patient for ongoing care management follow up and provided patient with direct contact information for care management team . Provided patient with printed educational materials related to Mood and Your Health Zone Safety Tool   Patient Self Care Activities:  . Currently UNABLE TO independently self administer medications; unable to independently perform ADL's and IADL's   Please see past updates related to this goal by clicking on the "Past Updates" button in the selected goal      . "we need to figure out why she is loosing weight"       Sister stated:  [place] (see longitudinal plan of care for additional care plan information)  Current Barriers:  [name] Knowledge Deficits related to evaluation and treatment of abnormal weight loss   . Chronic Disease Management support and education needs related to Essential Hypertension, CVA, Depression, Abnormal weight loss   Nurse Case Manager Clinical Goal(s):  [name] Over the next 90 days, patient will work with PCP and GI to address needs related to evaluation and treatment of abnormal weight loss  Social Work [employer]): [name] Over the next 60 days the patient will work with SW to determine if her health plan will  assist with costs of nutritional supplements due to abnormal weight loss Goal Met  CCM RN CM Interventions:  01/19/20 spoke to patient's sister [name] . Inter-disciplinary care team collaboration (see longitudinal plan of care) . Evaluation of current treatment plan related to Abnormal weight loss and patient's adherence to plan as established by provider . Determined patient is scheduled for a Colonoscopy on 01/30/20 for evaluation of abnormal weight loss . Discussed Ms. Schommer has had an 18 weight loss since last November for an unknown reason . Discussed patient's recent Cardiology visit to establish how to hold her anticoagulants pre-operatively for the Colonscopy . Determined and reviewed with sister [name] the following recommendations were provided per Cardiology; o  ASSESSMENT AND PLAN: o 1. Cardiology Pre-Operative Evaluation:Chart reviewed as part of pre-operative protocol coverage. Given past medical history and time since last visit, based on ACC/AHA guidelines, Petrea Foell would be at acceptable risk for the planned procedure without further cardiovascular testing.  o I have spoken with Pharm.D. onsite.  She may hold Eliquis for 24 hours prior to her GI procedure.  This would be on 01/29/2020.  Concerning Plavix we will hold this on 01/25/2020.  She will restart both medications ASAP after procedure dated 01/30/2020. o 2.  Paroxysmal atrial  fibrillation: She is now in sinus rhythm with premature supraventricular complexes.  She continues on Eliquis.  Holding Eliquis as above for procedure.  She is not on any AV nodal blocking agents at this time.  Heart rate is well controlled. o 3.  History of CVA: This occurred in 2019.  I have reviewed this with Pharm.D. onsite.  As above hold Eliquis for 24 hours prior to procedure.  She will hold Plavix for 5 days prior to the procedure.  This is been explained to her and also dates are given. o 4.  Hypertension: Blood pressure slightly elevated today.   She will continue on amlodipine 10 mg daily, and Accupril 40 mg daily. o 5.  Hyperlipidemia: She remains on atorvastatin 80 mg daily.  Goal of LDL less than 70.  Follow-up labs per PCP. o Current medicines are reviewed at length with the patient today.  I have spent 45 minutes dedicated to the care of this patient on the date of this encounter to include pre-visit review of records, assessment, management and diagnostic testing,with shared decision making. . Assessed for patient's current meal plan at home, per sister [name], the family prepares her meals and provides her with fresh fruits and vegetables and plenty of protein . Determined patient is not displaying GI symptoms, however, she has a poor appetite, her sister thinks this is related to her depression  . Assessed for nutritional supplementation, currently, the patient is unable to afford to purchase Ensure or Boost, she will drink Ensure if she has it on hand . Sent in basket message to embedded BSW requesting f/u on resources to help cover the cost of Ensure . Discussed Tillie Rung will outreach to [employer] to explore programs and or resources to help with nutritional support . Sent in basket message to CMA Tianna B requesting Ensure samples be provided to Ms. Dugger during her OV scheduled for 01/21/20, reply received that samples will be provided  . Discussed plans with patient for ongoing care management follow up and provided patient with direct contact information for care management team   CCM SW Interventions: Completed 04/06/20 with [name] . Successful outbound call placed to [facility] to confirm receipt of mailed resource o Patient did receive Ensure coupons and has been able to access nutritional supplements . SW goal met; no SW further outreach planned  Completed 03/25/20 with patient sister [name] . Inbound call received from the patient's sister, [name] . Determined the patient has not received Ensure coupons previously mailed  out by SW . Mailed more coupons to the patient home address . Scheduled follow up over the next two weeks to confirm receipt   Completed 02/23/20 . Outbound call placed to the patients sister, [name], to determine she has yet to be contacted by [employer]  . Outbound call placed to [facility] ([phone]) to follow up on patient delivery status o Spoke with Raquel Sarna who reports unsuccessful calls have been placed to the patient several times o Discussed sister is the best point of contact o Informed by Raquel Sarna the patient does not qualify to receive Ensure under Medicaid benefit due to being over the age of 83 and without CAP services . Successful outbound call placed to [facility] to discuss patient denial of benefit o Determined the patients sister and daughter are assisting with the cost of Ensure at this time o Tiffany Kocher the patient can use food and nutrition benefit to cover the cost- [name] reports the patient does not receive enough assistance for this  to be an option o Mailed the patient Ensure coupons to assist with cost savings . Scheduled follow up call over the next month  Patient Self Care Activities:  . Currently UNABLE TO independently self administer medications; unable to independently perform ADL's and IADL's   Please see past updates related to this goal by clicking on the "Past Updates" button in the selected goal          No SW follow up planned at this time. The patient will remain active with RN Care Manager. Next scheduled outreach planned for 04/29/20 with Barb Merino, RN.  [name], BSW, CDP Social Worker, Certified Dementia Practitioner [place] [facility] [place] [facility] [phone]

## 2020-04-06 NOTE — Patient Instructions (Signed)
Social Worker Visit Information  Goals we discussed today:  Goals Addressed            This Visit's Progress   . "to help her get control over her depression"   Not on track    Sister stated   [place] (see longitudinal plan of care for additional care plan information)  Current Barriers:  [name] Knowledge Deficits related to evaluation and treatment of Depression  . Chronic Disease Management support and education needs related to Depression, Essential Hypertension, CVA, Abnormal Weight Loss   Nurse Case Manager Clinical Goal(s):  [name] Over the next 90 days, patient will work with the CCM team and PCP to address needs related to disease education and support to improve Self Health management of depression   CCM SW Interventions: Completed 04/06/20 with [name] . Performed chart review to note patient seen in office 8/17 by primary provider, [name] FNP o [name] documentation indicates new orders of Citalopram 10 mg PO daily  o Patient and her sister encouraged to contact psychiatrist to schedule a new patient appointment . Successful outbound call placed to [facility] to assess goal progression o [name] reports she has yet to contact psychiatrist on behalf of the patient o Encouraged [name] to contact psych provider ASAP in order to start treatment of depression . Determined Loreli Slot has yet to pick up new medication stating "they didn't give her new medication. They want her to start therapy first" o Reviewed OV note with [name] which indicated the patient would be started on Citalopram o [place] would request follow up from the patients clinical team to review plan to treat depression . Collaboration with Fransisco Beau, CMA to request follow up with the patient regarding if [name] intends for the patient to begin medication at this time . Collaboration with RN Care Manager to update on goal progression  Completed 03/25/20 with patient sister [name] . Inbound call  received from the patients sister, Vinie Sill reports the patient continues to lose weight and report feelings of depression o Family attempts to engage patient by taking out for meals and to visit local parks at least two times per week o The patient continues to have a daily PCS caregiver for 2 hours each day . Determined the patient has yet to begin treatment for depression o [name] reports she contacted the [facility] and was informed they were not taking new patients at this time . [place] would request a new referral be sent on the patients behalf to an alternative agency . Collaboration with patient primary provider to request new orders  . Scheduled follow up call over the next two weeks to assess goal progression  CCM RN CM Interventions:  01/19/20 call completed with sister [name] . Inter-disciplinary care team collaboration (see longitudinal plan of care) . Evaluation of current treatment plan related to Depression and patient's adherence to plan as established by provider . Reviewed medications with patient and discussed patient is not currently taking an anti-depressant due to PCP recommended talk therapy  . Determined PCP sent a Psychology referral at last visit in April; Determined Ms. Zufall nor sister [name] were contacted by the Psychologist to schedule a new patient appointment . Determined the referral was processed and sent to [facility]; Provided [name] the contact name/number and advised her to contact this provider ASAP in order to schedule a new patient appointment and to get services started . Determined  currently Ms. Gumina family alternates visiting with her, they prepare meals and take her for short drives . Reviewed upcoming appointment with PCP provider [name], FNP scheduled for 01/21/20 @11 :70 AM  . Discussed plans with patient for ongoing care management follow up and provided patient with direct contact information for  care management team . Provided patient with printed educational materials related to Mood and Your Health Zone Safety Tool   Patient Self Care Activities:  . Currently UNABLE TO independently self administer medications; unable to independently perform ADL's and IADL's   Please see past updates related to this goal by clicking on the "Past Updates" button in the selected goal      . "we need to figure out why she is loosing weight"       Sister stated:  [place] (see longitudinal plan of care for additional care plan information)  Current Barriers:  [name] Knowledge Deficits related to evaluation and treatment of abnormal weight loss   . Chronic Disease Management support and education needs related to Essential Hypertension, CVA, Depression, Abnormal weight loss   Nurse Case Manager Clinical Goal(s):  [name] Over the next 90 days, patient will work with PCP and GI to address needs related to evaluation and treatment of abnormal weight loss  Social Work [employer]): [name] Over the next 60 days the patient will work with SW to determine if her health plan will assist with costs of nutritional supplements due to abnormal weight loss Goal Met  CCM RN CM Interventions:  01/19/20 spoke to patient's sister [name] . Inter-disciplinary care team collaboration (see longitudinal plan of care) . Evaluation of current treatment plan related to Abnormal weight loss and patient's adherence to plan as established by provider . Determined patient is scheduled for a Colonoscopy on 01/30/20 for evaluation of abnormal weight loss . Discussed Ms. Lallier has had an 18 weight loss since last November for an unknown reason . Discussed patient's recent Cardiology visit to establish how to hold her anticoagulants pre-operatively for the Colonscopy . Determined and reviewed with sister [name] the following recommendations were provided per Cardiology; o  ASSESSMENT AND PLAN: o 1. Cardiology Pre-Operative Evaluation:Chart  reviewed as part of pre-operative protocol coverage. Given past medical history and time since last visit, based on ACC/AHA guidelines, Nishita Drapeau would be at acceptable risk for the planned procedure without further cardiovascular testing.  o I have spoken with Pharm.D. onsite.  She may hold Eliquis for 24 hours prior to her GI procedure.  This would be on 01/29/2020.  Concerning Plavix we will hold this on 01/25/2020.  She will restart both medications ASAP after procedure dated 01/30/2020. o 2.  Paroxysmal atrial fibrillation: She is now in sinus rhythm with premature supraventricular complexes.  She continues on Eliquis.  Holding Eliquis as above for procedure.  She is not on any AV nodal blocking agents at this time.  Heart rate is well controlled. o 3.  History of CVA: This occurred in 2019.  I have reviewed this with Pharm.D. onsite.  As above hold Eliquis for 24 hours prior to procedure.  She will hold Plavix for 5 days prior to the procedure.  This is been explained to her and also dates are given. o 4.  Hypertension: Blood pressure slightly elevated today.  She will continue on amlodipine 10 mg daily, and Accupril 40 mg daily. o 5.  Hyperlipidemia: She remains on atorvastatin 80 mg daily.  Goal of LDL less than 70.  Follow-up labs  per PCP. o Current medicines are reviewed at length with the patient today.  I have spent 45 minutes dedicated to the care of this patient on the date of this encounter to include pre-visit review of records, assessment, management and diagnostic testing,with shared decision making. . Assessed for patient's current meal plan at home, per sister [name], the family prepares her meals and provides her with fresh fruits and vegetables and plenty of protein . Determined patient is not displaying GI symptoms, however, she has a poor appetite, her sister thinks this is related to her depression  . Assessed for nutritional supplementation, currently, the patient is unable to  afford to purchase Ensure or Boost, she will drink Ensure if she has it on hand . Sent in basket message to embedded BSW requesting f/u on resources to help cover the cost of Ensure . Discussed Tillie Rung will outreach to [employer] to explore programs and or resources to help with nutritional support . Sent in basket message to CMA Tianna B requesting Ensure samples be provided to Ms. Ratterman during her OV scheduled for 01/21/20, reply received that samples will be provided  . Discussed plans with patient for ongoing care management follow up and provided patient with direct contact information for care management team   CCM SW Interventions: Completed 04/06/20 with [name] . Successful outbound call placed to [facility] to confirm receipt of mailed resource o Patient did receive Ensure coupons and has been able to access nutritional supplements . SW goal met; no SW further outreach planned  Completed 03/25/20 with patient sister [name] . Inbound call received from the patient's sister, [name] . Determined the patient has not received Ensure coupons previously mailed out by SW . Mailed more coupons to the patient home address . Scheduled follow up over the next two weeks to confirm receipt   Completed 02/23/20 . Outbound call placed to the patients sister, [name], to determine she has yet to be contacted by [employer]  . Outbound call placed to [facility] ([phone]) to follow up on patient delivery status o Spoke with Raquel Sarna who reports unsuccessful calls have been placed to the patient several times o Discussed sister is the best point of contact o Informed by Raquel Sarna the patient does not qualify to receive Ensure under Medicaid benefit due to being over the age of 83 and without CAP services . Successful outbound call placed to [facility] to discuss patient denial of benefit o Determined the patients sister and daughter are assisting with the cost of Ensure at this time o Tiffany Kocher the patient can use  food and nutrition benefit to cover the cost- [name] reports the patient does not receive enough assistance for this to be an option o Mailed the patient Ensure coupons to assist with cost savings . Scheduled follow up call over the next month  Patient Self Care Activities:  . Currently UNABLE TO independently self administer medications; unable to independently perform ADL's and IADL's   Please see past updates related to this goal by clicking on the "Past Updates" button in the selected goal           Follow Up Plan: No SW follow up planned at this time. The patient will remain active with RN Care Manager.   [name], BSW, CDP Social Worker, Certified Dementia Practitioner [place] [facility] [phone]

## 2020-04-20 ENCOUNTER — Ambulatory Visit
Admission: RE | Admit: 2020-04-20 | Discharge: 2020-04-20 | Disposition: A | Discharge status: Home / Self Care | Payer: Medicaid Other | Source: Ambulatory Visit | Attending: Nurse Practitioner | Admitting: Nurse Practitioner

## 2020-04-20 DIAGNOSIS — Z720 Tobacco use: Secondary | ICD-10-CM

## 2020-04-25 ENCOUNTER — Emergency Department (HOSPITAL_COMMUNITY): Payer: Medicaid Other

## 2020-04-25 ENCOUNTER — Emergency Department (HOSPITAL_COMMUNITY)
Admission: EM | Admit: 2020-04-25 | Discharge: 2020-04-26 | Disposition: A | Discharge status: Home / Self Care | Payer: Medicaid Other | Attending: Emergency Medicine | Admitting: Emergency Medicine

## 2020-04-25 ENCOUNTER — Other Ambulatory Visit: Payer: Self-pay

## 2020-04-25 ENCOUNTER — Encounter (HOSPITAL_COMMUNITY): Payer: Self-pay | Admitting: Emergency Medicine

## 2020-04-25 DIAGNOSIS — Z87891 Personal history of nicotine dependence: Secondary | ICD-10-CM | POA: Insufficient documentation

## 2020-04-25 DIAGNOSIS — Z79899 Other long term (current) drug therapy: Secondary | ICD-10-CM | POA: Insufficient documentation

## 2020-04-25 DIAGNOSIS — Z951 Presence of aortocoronary bypass graft: Secondary | ICD-10-CM | POA: Diagnosis not present

## 2020-04-25 DIAGNOSIS — I10 Essential (primary) hypertension: Secondary | ICD-10-CM | POA: Insufficient documentation

## 2020-04-25 DIAGNOSIS — R42 Dizziness and giddiness: Secondary | ICD-10-CM

## 2020-04-25 DIAGNOSIS — R55 Syncope and collapse: Secondary | ICD-10-CM | POA: Diagnosis present

## 2020-04-25 DIAGNOSIS — I2510 Atherosclerotic heart disease of native coronary artery without angina pectoris: Secondary | ICD-10-CM | POA: Diagnosis not present

## 2020-04-25 DIAGNOSIS — Z7901 Long term (current) use of anticoagulants: Secondary | ICD-10-CM | POA: Diagnosis not present

## 2020-04-25 DIAGNOSIS — R110 Nausea: Secondary | ICD-10-CM | POA: Insufficient documentation

## 2020-04-25 LAB — CBC
HCT: 39.3 % (ref 36.0–46.0)
Hemoglobin: 11.7 g/dL — ABNORMAL LOW (ref 12.0–15.0)
MCH: 22.1 pg — ABNORMAL LOW (ref 26.0–34.0)
MCHC: 29.8 g/dL — ABNORMAL LOW (ref 30.0–36.0)
MCV: 74.3 fL — ABNORMAL LOW (ref 80.0–100.0)
Platelets: 199 10*3/uL (ref 150–400)
RBC: 5.29 MIL/uL — ABNORMAL HIGH (ref 3.87–5.11)
RDW: 18.8 % — ABNORMAL HIGH (ref 11.5–15.5)
WBC: 5.5 10*3/uL (ref 4.0–10.5)
nRBC: 0 % (ref 0.0–0.2)

## 2020-04-25 LAB — BASIC METABOLIC PANEL
Anion gap: 9 (ref 5–15)
BUN: 14 mg/dL (ref 8–23)
CO2: 25 mmol/L (ref 22–32)
Calcium: 9.6 mg/dL (ref 8.9–10.3)
Chloride: 106 mmol/L (ref 98–111)
Creatinine, Ser: 0.97 mg/dL (ref 0.44–1.00)
GFR calc Af Amer: >60 mL/min (ref >60)
GFR calc non Af Amer: >60 mL/min (ref >60)
Glucose, Bld: 113 mg/dL — ABNORMAL HIGH (ref 70–99)
Potassium: 3.9 mmol/L (ref 3.5–5.1)
Sodium: 140 mmol/L (ref 135–145)

## 2020-04-25 LAB — TROPONIN I (HIGH SENSITIVITY): Troponin I (High Sensitivity): 8 ng/L (ref <18)

## 2020-04-25 LAB — CBG MONITORING, ED: Glucose-Capillary: 154 mg/dL — ABNORMAL HIGH (ref 70–99)

## 2020-04-25 MED ORDER — MECLIZINE HCL 25 MG PO TABS: 25.0000 mg | ORAL_TABLET | Freq: Three times a day (TID) | ORAL | 0 refills | Status: DC | PRN

## 2020-04-25 MED ORDER — SODIUM CHLORIDE 0.9 % IV SOLN: INTRAVENOUS | Status: DC

## 2020-04-25 MED ORDER — ONDANSETRON HCL 4 MG/2ML IJ SOLN
4.0000 mg | Freq: Once | INTRAMUSCULAR | Status: DC
  Filled 2020-04-25: qty 2

## 2020-04-25 MED ORDER — SODIUM CHLORIDE 0.9 % IV BOLUS: 1000.0000 mL | Freq: Once | INTRAVENOUS | Status: DC

## 2020-04-25 MED ORDER — MECLIZINE HCL 25 MG PO TABS
25.0000 mg | ORAL_TABLET | Freq: Once | ORAL | Status: AC
  Administered 2020-04-25: 25 mg via ORAL
  Filled 2020-04-25 (×2): qty 1

## 2020-04-25 NOTE — ED Notes (Signed)
Pt refused IV, Dr. Gilford Raid aware.

## 2020-04-25 NOTE — ED Triage Notes (Signed)
Pt to triage via [facility] from home.  Reports syncopal episode.  Family found her lying in floor.  Dizziness prior and after fall.  Reports generalized weakness and nausea.  Denies pain.  No neuro deficits.  CBG 125.

## 2020-04-25 NOTE — ED Provider Notes (Signed)
[facility] EMERGENCY DEPARTMENT Provider Note   CSN: [id number] Arrival date & time: 04/25/20  1218     History Chief Complaint  Patient presents with  . Loss of Consciousness    Kimberly [name] is a 64 y.o. female.  Pt presents to the ED today with a syncopal event.  Pt said she has been getting dizzy and nauseous a lot.  She felt dizzy and nauseous and passed out.  The pt said she was found by a family member.  Pt still has some dizziness now.  She denies any cp or sob.          Past Medical History:  Diagnosis Date  . Bicipital tenosynovitis   . Coronary artery disease 05/2007   [id number]   . Fall 02/13/2018  . Family history of ischemic heart disease   . Gout   . Hammer toe   . Hip, thigh, leg, and ankle, insect bite, nonvenomous, without mention of infection(916.4)   . Hyperlipidemia   . Hypertension   . Other abnormality of red blood cells   . Rotator cuff syndrome of left shoulder   . Stroke [facility])     Patient Active Problem List   Diagnosis Date Noted  . Educated about COVID-19 virus infection 11/16/2019  . Urinary frequency 04/14/2019  . Cough 04/14/2019  . AKI (acute kidney injury) ([place]) 11/26/2018  . Diarrhea 11/25/2018  . Dehydration, mild 11/25/2018  . Hemiparesis affecting nondominant 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 ([place]) 07/31/2018  . Muscle ache 07/31/2018  . Prediabetes 07/31/2018  . Chronic gout without tophus 07/31/2018  . Vision loss 07/31/2018  . Malnutrition of moderate degree 02/15/2018  . Carotid occlusion, bilateral   . Atrial fibrillation with RVR ([place]) 02/13/2018  . Positive D dimer 02/13/2018  . Back pain 02/13/2018  . CVA (cerebral vascular accident) ([place]) 02/13/2018  . Paroxysmal atrial fibrillation ([place]) 02/13/2018  . Coronary artery disease involving native coronary artery of native heart without angina pectoris 02/13/2018  . 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 Aortic atherosclerosis ([place]) 02/13/2018  . CAD, ARTERY BYPASS GRAFT 05/03/2009  . TOBACCO USE, QUIT 05/03/2009  . ROTATOR CUFF SYNDROME, LEFT 02/12/2009  . Shoulder pain, left 11/03/2008  . HAMMER TOE, ACQUIRED 02/11/2008  . INSECT BITE, LEG 02/11/2008  . Cerebral artery occlusion with cerebral infarction ([place]) 12/05/2007  . Dyslipidemia 11/05/2007  . UNSPECIFIED DISORDER TEETH&SUPPORTING STRUCTURES 11/05/2007  . BICEPS TENDINITIS, RIGHT 04/24/2007  . Essential hypertension 04/23/2007  . MICROCYTOSIS 04/23/2007    Past Surgical History:  Procedure Laterality Date  . CARDIAC CATHETERIZATION    . COLONOSCOPY WITH PROPOFOL N/A 01/30/2020   Procedure: COLONOSCOPY WITH PROPOFOL;  Surgeon: Carol Ada, MD;  Location: [facility] ENDOSCOPY;  Service: Endoscopy;  Laterality: N/A;  . CORONARY ARTERY BYPASS GRAFT     triple  . CORONARY ARTERY BYPASS GRAFT  2008  . POLYPECTOMY  01/30/2020   Procedure: POLYPECTOMY;  Surgeon: Carol Ada, MD;  Location: [facility] ENDOSCOPY;  Service: Endoscopy;;  . TUBAL LIGATION       OB History   No obstetric history on file.     Family History  Problem Relation Age of Onset  . Hypertension Mother   . Heart attack Father   . Diabetes Father   . Kidney failure Father   . Anxiety disorder Sister   . Sarcoidosis Sister     Social History   Tobacco Use  .  Smoking status: Former Smoker    Packs/day: 2.00    Years: 35.00    Pack years: 70.00    Types: Cigarettes    Quit date: 08/12/2019    Years since quitting: 0.7  . Smokeless tobacco: Former Systems developer    Quit date: 08/09/2008  . Tobacco comment: had assistance with the quit smoking - had patches and lozenges  Vaping Use  . Vaping Use: Never used  Substance Use Topics  . Alcohol use: Yes    Comment: ocassionally wine  . Drug use: Yes    Types: Marijuana    Comment: history of cocaine use    Home Medications Prior to Admission medications   Medication Sig  Start Date End Date Taking? Authorizing Provider  amLODipine (NORVASC) 5 MG tablet Take 1&1/2 tablets 7.5 mg daily 03/18/20   [name], MD  atorvastatin (LIPITOR) 80 MG tablet TAKE 1 TABLET BY MOUTH DAILY 03/01/20   [name], FNP  Blood Pressure Monitoring (BLOOD PRESSURE KIT) DEVI Use as directed to check blood pressure daily dx: I10 01/20/20   [name], FNP  citalopram (CELEXA) 10 MG tablet Take 1 tablet (10 mg total) by mouth daily. 03/30/20 03/30/21  [name], FNP  ELIQUIS 5 MG TABS tablet TAKE 1 TABLET BY MOUTH 2 TIMES DAILY Patient taking differently: Take 5 mg by mouth 2 (two) times daily.  01/20/20   [name], FNP  meclizine (ANTIVERT) 25 MG tablet Take 1 tablet (25 mg total) by mouth 3 (three) times daily as needed for dizziness. 04/25/20   Isla Pence, MD  MITIGARE 0.6 MG CAPS TAKE 1 CAPSULE BY MOUTH 2 TIMES DAILY Patient taking differently: Take 0.6 mg by mouth in the morning and at bedtime.  01/20/20   [name], FNP  omeprazole (PRILOSEC) 20 MG capsule TAKE 1 CAPSULE BY MOUTH EVERY DAY 02/12/20   [name], FNP  QC LO-DOSE ASPIRIN 81 MG EC tablet TAKE 1 TABLET BY MOUTH EVERY DAY 02/12/20   [name], FNP    Allergies    Clonidine derivatives and Spironolactone  Review of Systems   Review of Systems  Gastrointestinal: Positive for nausea.  Neurological: Positive for dizziness.  All other systems reviewed and are negative.   Physical Exam Updated Vital Signs BP (!) 134/99   Pulse 64   Temp 98.4 F (36.9 C) (Oral)   Resp 16   Ht 5' 7"  (1.702 m)   Wt 53.5 kg   SpO2 100%   BMI 18.48 kg/m   Physical Exam Vitals and nursing note reviewed.  Constitutional:      Appearance: Normal appearance.  HENT:     Head: Normocephalic and atraumatic.     Right Ear: External ear normal.     Left Ear: External ear normal.     Nose: Nose normal.     Mouth/Throat:     Mouth: Mucous membranes are moist.     Pharynx: Oropharynx is clear.  Eyes:      Extraocular Movements: Extraocular movements intact.     Conjunctiva/sclera: Conjunctivae normal.     Pupils: Pupils are equal, round, and reactive to light.  Cardiovascular:     Rate and Rhythm: Normal rate and regular rhythm.     Pulses: Normal pulses.     Heart sounds: Normal heart sounds.  Pulmonary:     Effort: Pulmonary effort is normal.     Breath sounds: Normal breath sounds.  Abdominal:     General: Abdomen is flat. Bowel sounds are  normal.     Palpations: Abdomen is soft.  Musculoskeletal:        General: Normal range of motion.     Cervical back: Normal range of motion and neck supple.  Skin:    General: Skin is warm.     Capillary Refill: Capillary refill takes less than 2 seconds.  Neurological:     General: No focal deficit present.     Mental Status: She is alert and oriented to person, place, and time.  Psychiatric:        Mood and Affect: Mood normal.        Behavior: Behavior normal.        Thought Content: Thought content normal.        Judgment: Judgment normal.     ED Results / Procedures / Treatments   Labs (all labs ordered are listed, but only abnormal results are displayed) Labs Reviewed  BASIC METABOLIC PANEL - Abnormal; Notable for the following components:      Result Value   Glucose, Bld 113 (*)    All other components within normal limits  CBC - Abnormal; Notable for the following components:   RBC 5.29 (*)    Hemoglobin 11.7 (*)    MCV 74.3 (*)    MCH 22.1 (*)    MCHC 29.8 (*)    RDW 18.8 (*)    All other components within normal limits  CBG MONITORING, ED - Abnormal; Notable for the following components:   Glucose-Capillary 154 (*)    All other components within normal limits  URINALYSIS, ROUTINE W REFLEX MICROSCOPIC  TROPONIN I (HIGH SENSITIVITY)  TROPONIN I (HIGH SENSITIVITY)    EKG No change since April  Radiology DG Chest 2 View  Result Date: 04/25/2020 CLINICAL DATA:  Syncope. EXAM: CHEST - 2 VIEW COMPARISON:  November 25, 2018 FINDINGS: Multiple sternal wires and vascular clips are seen. There is no evidence of acute infiltrate, pleural effusion or pneumothorax. The cardiac silhouette is mildly enlarged and unchanged in size. There is marked severity calcification of the aortic arch and tortuosity of the descending thoracic aorta. The visualized skeletal structures are unremarkable. IMPRESSION: 1. Evidence of prior median sternotomy/CABG. 2. No active cardiopulmonary disease. Electronically Signed   By: Virgina Norfolk M.D.   On: 04/25/2020 21:41   CT HEAD WO CONTRAST  Result Date: 04/25/2020 CLINICAL DATA:  Syncopal episode. EXAM: CT HEAD WITHOUT CONTRAST TECHNIQUE: Contiguous axial images were obtained from the base of the skull through the vertex without intravenous contrast. COMPARISON:  Plain brain CT, dated December 05, 2007 and MR head dated February 14, 2018 FINDINGS: Brain: There is mild cerebral atrophy with widening of the extra-axial spaces and ventricular dilatation. There are areas of decreased attenuation within the white matter tracts of the supratentorial brain, consistent with microvascular disease changes. A large area of cortical encephalomalacia, with adjacent chronic white matter low attenuation, is seen within the right occipital lobe. Vascular: No hyperdense vessel or unexpected calcification. Skull: Normal. Negative for fracture or focal lesion. Sinuses/Orbits: No acute finding. Other: None. IMPRESSION: 1. Generalized cerebral atrophy. 2. Chronic right occipital lobe infarct. 3. No acute intracranial abnormality. Electronically Signed   By: Virgina Norfolk M.D.   On: 04/25/2020 21:57    Procedures Procedures (including critical care time)  Medications Ordered in ED Medications  sodium chloride 0.9 % bolus 1,000 mL (1,000 mLs Intravenous Refused 04/25/20 2150)    And  0.9 %  sodium chloride infusion ( Intravenous Refused 04/25/20  2306)  ondansetron (ZOFRAN) injection 4 mg (4 mg Intravenous Refused  04/25/20 2150)  meclizine (ANTIVERT) tablet 25 mg (25 mg Oral Given 04/25/20 2314)    ED Course  I have reviewed the triage vital signs and the nursing notes.  Pertinent labs & imaging results that were available during my care of the patient were reviewed by me and considered in my medical decision making (see chart for details).    MDM Rules/Calculators/A&P                           Pt did have a stress test on 8/19.  She did have a prior infarct but no ischemia.  She also had some afib with rvr.  She is scheduled to see Dr. Percival Spanish in October.   Summary Defect 1:  There is a large defect of severe severity present in the basal inferolateral, basal anterolateral, mid inferolateral, mid anterolateral and apical lateral location. The defect is partially reversible.  Overall Study Impression Myocardial perfusion is abnormal.  Findings consistent with prior myocardial infarction with peri-infarct ischemia.  This is an intermediate risk study.     Initial labs are ok.  She refused an IV.  She did not want any IVFs or any more blood draws.  She did not want a cath for a urine.  She said she is ready to go home.  She feels better after eating 2 sandwiches.  However, she did get a little dizzy with standing.  But still won't let us give her IVFs.  Return if worse.  F/u with pcp.  Final Clinical Impression(s) / ED Diagnoses Final diagnoses:  Dizziness    Rx / DC Orders ED Discharge Orders         Ordered    meclizine (ANTIVERT) 25 MG tablet  3 times daily PRN        04/25/20 2315           Isla Pence, MD 04/25/20 2318

## 2020-04-25 NOTE — ED Notes (Signed)
Unable to complete orthostatic VS. Pt refused to stand stating "yall are doing to much."

## 2020-04-25 NOTE — ED Provider Notes (Signed)
MSE was initiated and I personally evaluated the patient and placed orders (if any) at  12:54 PM on April 25, 2020.  I evaluated the patient's EKG shortly after was taken.  EKG indicates mild ST elevation, leads III and aVF with T wave inversion anterolaterally.  At this time the patient's only complaint is nausea and either near syncope or syncope causing a fall.  She presented by EMS.  She is nontoxic.  She has had prior CABG and strokes.  At this time heart is regular rate and rhythm without murmur.  Lungs clear to auscultation.  Normal pulse right radius.  No dysarthria or aphasia is present.  Chest wall is nontender to palpation.  She is currently sitting in a wheelchair, comfortably.  Case discussed with Dr. Angelena Form, on-call for STEMI.  We reviewed the EKG together.  This EKG and the patient's presentation are not consistent with acute MI.  We will initiate routine evaluation for nausea, syncope, possible traumatic injury.  The patient appears stable so that the remainder of the MSE may be completed by another provider.   Daleen Bo, MD 04/25/20 1257

## 2020-04-25 NOTE — ED Notes (Signed)
Pt continues to refuse IV and blood draws

## 2020-04-26 NOTE — ED Notes (Signed)
Difficulty getting patient from bed to wheelchair and into car. Discussed concern with pt. Pt continued to states she was 'ready to get the hell out of here." pt discharged with daughter Jeannett Senior.

## 2020-04-27 ENCOUNTER — Encounter (HOSPITAL_COMMUNITY): Payer: Self-pay | Admitting: Emergency Medicine

## 2020-04-27 ENCOUNTER — Emergency Department (HOSPITAL_COMMUNITY)
Admission: EM | Admit: 2020-04-27 | Discharge: 2020-04-27 | Disposition: A | Discharge status: Home / Self Care | Payer: Medicaid Other | Attending: Emergency Medicine | Admitting: Emergency Medicine

## 2020-04-27 DIAGNOSIS — R197 Diarrhea, unspecified: Secondary | ICD-10-CM | POA: Diagnosis not present

## 2020-04-27 DIAGNOSIS — R112 Nausea with vomiting, unspecified: Secondary | ICD-10-CM | POA: Insufficient documentation

## 2020-04-27 DIAGNOSIS — R42 Dizziness and giddiness: Secondary | ICD-10-CM | POA: Insufficient documentation

## 2020-04-27 DIAGNOSIS — Z5321 Procedure and treatment not carried out due to patient leaving prior to being seen by health care provider: Secondary | ICD-10-CM | POA: Diagnosis not present

## 2020-04-27 LAB — CBC
HCT: 37 % (ref 36.0–46.0)
Hemoglobin: 11.1 g/dL — ABNORMAL LOW (ref 12.0–15.0)
MCH: 21.7 pg — ABNORMAL LOW (ref 26.0–34.0)
MCHC: 30 g/dL (ref 30.0–36.0)
MCV: 72.3 fL — ABNORMAL LOW (ref 80.0–100.0)
Platelets: 191 10*3/uL (ref 150–400)
RBC: 5.12 MIL/uL — ABNORMAL HIGH (ref 3.87–5.11)
RDW: 19.1 % — ABNORMAL HIGH (ref 11.5–15.5)
WBC: 5 10*3/uL (ref 4.0–10.5)
nRBC: 0 % (ref 0.0–0.2)

## 2020-04-27 LAB — BASIC METABOLIC PANEL
Anion gap: 9 (ref 5–15)
BUN: 12 mg/dL (ref 8–23)
CO2: 25 mmol/L (ref 22–32)
Calcium: 9.5 mg/dL (ref 8.9–10.3)
Chloride: 108 mmol/L (ref 98–111)
Creatinine, Ser: 0.83 mg/dL (ref 0.44–1.00)
GFR calc Af Amer: >60 mL/min (ref >60)
GFR calc non Af Amer: >60 mL/min (ref >60)
Glucose, Bld: 71 mg/dL (ref 70–99)
Potassium: 3.9 mmol/L (ref 3.5–5.1)
Sodium: 142 mmol/L (ref 135–145)

## 2020-04-27 NOTE — ED Triage Notes (Signed)
Pt arrives via [facility] for c/o ongoing n/v/d and some dizziness, pt was here on 9/12 but LWBS. EMS VSS 108/64, 78HR, 18RR, cbg 90, a/ox4, resp e/u, nad.

## 2020-04-27 NOTE — ED Notes (Signed)
Pt stated to her daughter that she wanted to go home. Pt, along with daughter, decided to leave without being seen by an EDP.

## 2020-04-29 ENCOUNTER — Telehealth: Payer: Self-pay

## 2020-05-05 ENCOUNTER — Ambulatory Visit: Payer: Self-pay | Admitting: Nurse Practitioner

## 2020-05-12 ENCOUNTER — Ambulatory Visit: Payer: Self-pay | Admitting: Nurse Practitioner

## 2020-05-17 ENCOUNTER — Encounter: Payer: Self-pay | Admitting: Nurse Practitioner

## 2020-05-17 ENCOUNTER — Other Ambulatory Visit: Payer: Self-pay

## 2020-05-17 ENCOUNTER — Ambulatory Visit: Payer: Self-pay

## 2020-05-17 ENCOUNTER — Ambulatory Visit (INDEPENDENT_AMBULATORY_CARE_PROVIDER_SITE_OTHER): Payer: Medicaid Other | Admitting: Nurse Practitioner

## 2020-05-17 VITALS — BP 122/68 | HR 76 | Temp 98.5°F

## 2020-05-17 DIAGNOSIS — Z23 Encounter for immunization: Secondary | ICD-10-CM | POA: Diagnosis not present

## 2020-05-17 DIAGNOSIS — R634 Abnormal weight loss: Secondary | ICD-10-CM

## 2020-05-17 DIAGNOSIS — K219 Gastro-esophageal reflux disease without esophagitis: Secondary | ICD-10-CM

## 2020-05-17 DIAGNOSIS — I719 Aortic aneurysm of unspecified site, without rupture: Secondary | ICD-10-CM | POA: Diagnosis not present

## 2020-05-17 DIAGNOSIS — I69359 Hemiplegia and hemiparesis following cerebral infarction affecting unspecified side: Secondary | ICD-10-CM

## 2020-05-17 DIAGNOSIS — R296 Repeated falls: Secondary | ICD-10-CM

## 2020-05-17 MED ORDER — OMEPRAZOLE 20 MG PO CPDR: 20.0000 mg | DELAYED_RELEASE_CAPSULE | Freq: Two times a day (BID) | ORAL | 1 refills | Status: DC

## 2020-05-17 NOTE — Patient Instructions (Signed)
Visit Information  Goals Addressed            This Visit's Progress   . Collaborate with RN Care Manager to perform appropriate assessments to assist with care coordination needs       CARE PLAN ENTRY (see longitudinal plan of care for additional care plan information)  Current Barriers:  . Level of care concerns . ADL IADL limitations . Social Isolation . Limited access to caregiver . Ongoing weight loss  Social Work Clinical Goal(s):  [name] Over the next 90 days the patient will work with SW to address care coordination needs  CCM SW Interventions: Completed 05/17/20 . Inter-disciplinary care team collaboration (see longitudinal plan of care) . Collaboration with [name], FNP to review past interventions to address abnormal weight loss o Past application processed in an attempt to have patients health plan cover the cost of Ensure o Reviewed the application was denied due to the patient not using Ensure has her primary nutrition source o Informed [name] planned to refer the patient to a nutritionist . Collaboration with [name], FNP to review caregiver resources o Discussed the patient is currently receiving PCS hours but is in need of more care o Performed chart review to note SW discussed PACE referral with the patient and her sister in May of 2020- patient declined referral at this time o Discussed SW did not assist with a CAP application as the patients sister reported the patient was placed on the CAP waiting list in October of 2019 . Collaboration with Alicia Amel with the CAP program to request an update on patient application status . Scheduled follow up to the patient over the next 10 days . Collaboration with [place] to review interventions and plan  Patient Self Care Activities:  . Self administers medications as prescribed . Attends all scheduled provider appointments . Lacks social connections . Unable to perform ADLs  independently . Unable to perform IADLs independently  Initial goal documentation        The care management team will reach out to the patient again over the next 10 days.   [name], BSW, CDP Social Worker, Certified Dementia Practitioner [place] [facility] [place] [facility] [phone]

## 2020-05-17 NOTE — Progress Notes (Signed)
I,Yamilka Roman [employer] as a Education administrator for [employer], FNP.,have documented all relevant documentation on the behalf of [name], FNP,as directed by  [name], FNP while in the presence of [name], [place].  This visit occurred during the SARS-CoV-2 public health emergency.  Safety protocols were in place, including screening questions prior to the visit, additional usage of staff PPE, and extensive cleaning of exam room while observing appropriate contact time as indicated for disinfecting solutions.  Subjective:     Patient ID: Kimberly [name] , female    DOB: 1955-09-23 , 64 y.o.   MRN: [id number]   Chief Complaint  Patient presents with  . ER F/U    Patient stated she has been queasy at the stomach     HPI  Here after going to the ER two times in September and left before being seen.  She had been having dizziness and nausea.   Wt Readings from Last 3 Encounters: 04/25/20 : 118 lb (53.5 kg) 04/01/20 : 112 lb (50.8 kg) 03/18/20 : 112 lb 12.8 oz (51.2 kg)  She had been found on the floor by her caregiver - 12 hours and  Waited 16 hours the next time - she was nauseated, dizziness and light headed. She does have several caregivers coming in and out of the house.    She has been eating better since going to the hospital. Stopped the citalopram due to having nausea and thought was related to the medication, this was 3 weeks ago and continues to have some nausea. She has not been in touch with Dr. Benson Norway since having her colonoscopy for the nausea.    She is eating activa every day.  She is taking her medications after eating breakfast.        Past Medical History:  Diagnosis Date  . Bicipital tenosynovitis   . Coronary artery disease 05/2007   [id number]   . Fall 02/13/2018  . Family history of ischemic heart disease   . Gout   . Hammer toe   . Hip, thigh, leg, and ankle, insect bite, nonvenomous, without mention of infection(916.4)   . Hyperlipidemia   .  Hypertension   . Other abnormality of red blood cells   . Rotator cuff syndrome of left shoulder   . Stroke [facility])      Family History  Problem Relation Age of Onset  . Hypertension Mother   . Heart attack Father   . Diabetes Father   . Kidney failure Father   . Anxiety disorder Sister   . Sarcoidosis Sister      Current Outpatient Medications:  .  amLODipine (NORVASC) 5 MG tablet, Take 1&1/2 tablets 7.5 mg daily, Disp: 135 tablet, Rfl: 3 .  atorvastatin (LIPITOR) 80 MG tablet, TAKE 1 TABLET BY MOUTH DAILY, Disp: 90 tablet, Rfl: 1 .  Blood Pressure Monitoring (BLOOD PRESSURE KIT) DEVI, Use as directed to check blood pressure daily dx: I10, Disp: 1 each, Rfl: 1 .  citalopram (CELEXA) 10 MG tablet, Take 1 tablet (10 mg total) by mouth daily., Disp: 30 tablet, Rfl: 2 .  ELIQUIS 5 MG TABS tablet, TAKE 1 TABLET BY MOUTH 2 TIMES DAILY (Patient taking differently: Take 5 mg by mouth 2 (two) times daily. ), Disp: 180 tablet, Rfl: 3 .  meclizine (ANTIVERT) 25 MG tablet, Take 1 tablet (25 mg total) by mouth 3 (three) times daily as needed for dizziness., Disp: 30 tablet, Rfl: 0 .  MITIGARE 0.6 MG CAPS, TAKE 1 CAPSULE  BY MOUTH 2 TIMES DAILY (Patient taking differently: Take 0.6 mg by mouth in the morning and at bedtime. ), Disp: 180 capsule, Rfl: 1 .  omeprazole (PRILOSEC) 20 MG capsule, Take 1 capsule (20 mg total) by mouth 2 (two) times daily before a meal., Disp: 180 capsule, Rfl: 1 .  QC LO-DOSE ASPIRIN 81 MG EC tablet, TAKE 1 TABLET BY MOUTH EVERY DAY, Disp: 90 tablet, Rfl: 1   Allergies  Allergen Reactions  . Clonidine Derivatives Other (See Comments)    Patch causes scars  . Spironolactone Rash     Review of Systems  Constitutional: Negative.   Respiratory: Negative.   Cardiovascular: Negative.  Negative for chest pain, palpitations and leg swelling.  Gastrointestinal: Positive for nausea. Negative for vomiting.  Neurological: Positive for dizziness. Negative for headaches.   Psychiatric/Behavioral: Negative.      Today's Vitals   05/17/20 1213  BP: 122/68  Pulse: 76  Temp: 98.5 F (36.9 C)  PainSc: 0-No pain   There is no height or weight on file to calculate BMI.   Objective:  Physical Exam Constitutional:      General: She is not in acute distress.    Appearance: Normal appearance.  Cardiovascular:     Rate and Rhythm: Normal rate and regular rhythm.  Neurological:     General: No focal deficit present.     Mental Status: She is alert and oriented to person, place, and time.  Psychiatric:        Mood and Affect: Mood normal.        Behavior: Behavior normal.        Thought Content: Thought content normal.        Judgment: Judgment normal.         Assessment And Plan:     1. Gastroesophageal reflux disease without esophagitis Will increase omeprazole two times a day - omeprazole (PRILOSEC) 20 MG capsule; Take 1 capsule (20 mg total) by mouth 2 (two) times daily before a meal.  Dispense: 180 capsule; Refill: 1  2. Abnormal weight loss  She has had a colonoscopy and low dose CT scan which were no indication for a mass  I will refer her to a nutritionist to see if they can help with increasing her weight - Amb ref to Medical Nutrition Therapy-MNT  3. Aortic aneurysm without rupture, unspecified portion of aorta (HCC)  This is a new incidental finding measuring 4.3cm  I will forward the results to Dr. Percival Spanish as well  She does report a family history of aneurysm with her mother  83. Hemiparesis affecting nondominant 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 (CVA) ([place])  Weakness to left upper and lower extremity  5. Frequent falls  She has been having multiple falls and being found on the floor by her caregivers.   6. Need for influenza vaccination  Influenza vaccine administered  Encouraged to take Tylenol as needed for fever or muscle aches. - Flu Vaccine QUAD 6+ mos PF IM (Fluarix Quad PF)   Discussed her  results of her Low Dose CT scan  Patient was given opportunity to ask questions. Patient verbalized understanding of the plan and was able to repeat key elements of the plan. All questions were answered to their satisfaction.   Teola Bradley, FNP, have reviewed all documentation for this visit. The documentation on 05/17/20 for the exam, diagnosis, procedures, and orders are all accurate and complete.   THE PATIENT IS ENCOURAGED TO PRACTICE SOCIAL DISTANCING DUE  TO THE COVID-19 PANDEMIC.

## 2020-05-17 NOTE — Chronic Care Management (AMB) (Signed)
  Care Management   Follow Up Note   05/17/2020 Name: Kimberly [name] MRN: [id number] DOB: 30-Apr-1956  Referred by: [name], FNP Reason for referral : Care Coordination   Kimberly [name] is a 64 y.o. year old female who is a primary care patient of [name], [place]. The care management team was consulted for assistance with care management and care coordination needs.    Review of patient status, including review of consultants reports, relevant laboratory and other test results, and collaboration with appropriate care team members and the patient's provider was performed as part of comprehensive patient evaluation and provision of chronic care management services.    SDOH (Social Determinants of Health) assessments performed: No See Care Plan activities for detailed interventions related to [facility])     Advanced Directives: See Care Plan and Vynca application for related entries.   Goals Addressed            This Visit's Progress   . Collaborate with RN Care Manager to perform appropriate assessments to assist with care coordination needs       CARE PLAN ENTRY (see longitudinal plan of care for additional care plan information)  Current Barriers:  . Level of care concerns . ADL IADL limitations . Social Isolation . Limited access to caregiver . Ongoing weight loss  Social Work Clinical Goal(s):  [name] Over the next 90 days the patient will work with SW to address care coordination needs  CCM SW Interventions: Completed 05/17/20 . Inter-disciplinary care team collaboration (see longitudinal plan of care) . Collaboration with [name], FNP to review past interventions to address abnormal weight loss o Past application processed in an attempt to have patients health plan cover the cost of Ensure o Reviewed the application was denied due to the patient not using Ensure has her primary nutrition source o Informed [name] planned to refer the patient to a  nutritionist . Collaboration with [name], FNP to review caregiver resources o Discussed the patient is currently receiving PCS hours but is in need of more care o Performed chart review to note SW discussed PACE referral with the patient and her sister in May of 2020- patient declined referral at this time o Discussed SW did not assist with a CAP application as the patients sister reported the patient was placed on the CAP waiting list in October of 2019 . Collaboration with Alicia Amel with the CAP program to request an update on patient application status . Scheduled follow up to the patient over the next 10 days . Collaboration with [place] to review interventions and plan  Patient Self Care Activities:  . Self administers medications as prescribed . Attends all scheduled provider appointments . Lacks social connections . Unable to perform ADLs independently . Unable to perform IADLs independently  Initial goal documentation         The care management team will reach out to the patient again over the next 10 days.   [name], BSW, CDP Social Worker, Certified Dementia Practitioner [place] [facility] [place] [facility] [phone]

## 2020-05-19 ENCOUNTER — Ambulatory Visit: Payer: Medicaid Other

## 2020-05-19 ENCOUNTER — Ambulatory Visit: Payer: Medicaid Other | Admitting: Cardiology

## 2020-05-19 DIAGNOSIS — R296 Repeated falls: Secondary | ICD-10-CM

## 2020-05-19 DIAGNOSIS — R634 Abnormal weight loss: Secondary | ICD-10-CM

## 2020-05-19 NOTE — Patient Instructions (Signed)
Visit Information  Goals Addressed            This Visit's Progress   . Collaborate with RN Care Manager to perform appropriate assessments to assist with care coordination needs       CARE PLAN ENTRY (see longitudinal plan of care for additional care plan information)  Current Barriers:  . Level of care concerns . ADL IADL limitations . Social Isolation . Limited access to caregiver . Ongoing weight loss  Social Work Clinical Goal(s):  [name] Over the next 90 days the patient will work with SW to address care coordination needs  CCM SW Interventions: Completed 05/19/20 . Inbound call received from [facility] with the [facility] CAP program o Informed the patient had an application submitted in both 2019 and June 2021 o Kimberly [name] reports application from June 8786 was denied by state Medicaid plan stating "can not proceed" o Determined the [facility] CAP program is unable to access why the application was denied at this time Kimberly [name] provided SW with the [facility] number [phone]) as well as the number the Kimberly [name]) who is the administrative assistance for the state Medicaid CAP program . Unsuccessful outbound call placed to Kimberly [name] to follow up on patient past denied application as well as to inquire patients eligibility for CAP services o Voice message left requesting a return call . Social work to continue to follow  Completed 05/17/20 . Inter-disciplinary care team collaboration (see longitudinal plan of care) . Collaboration with Kimberly Brine, FNP to review past interventions to address abnormal weight loss o Past application processed in an attempt to have patients health plan cover the cost of Ensure o Reviewed the application was denied due to the patient not using Ensure has her primary nutrition source o Informed Kimberly [name] planned to refer the patient to a nutritionist . Collaboration with Kimberly Brine, FNP to review caregiver  resources o Discussed the patient is currently receiving PCS hours but is in need of more care o Performed chart review to note SW discussed PACE referral with the patient and her sister in May of 2020- patient declined referral at this time o Discussed SW did not assist with a CAP application as the patients sister reported the patient was placed on the CAP waiting list in October of 2019 . Collaboration with Kimberly [name] with the CAP program to request an update on patient application status . Scheduled follow up to the patient over the next 10 days . Collaboration with Kimberly [name] to review interventions and plan  Patient Self Care Activities:  . Self administers medications as prescribed . Attends all scheduled provider appointments . Lacks social connections . Unable to perform ADLs independently . Unable to perform IADLs independently  Please see past updates related to this goal by clicking on the "Past Updates" button in the selected goal         SW will continue to follow  Kimberly [name], BSW, CDP Social Worker, Certified Dementia Practitioner [place] [facility] [place] [facility] [phone]

## 2020-05-19 NOTE — Chronic Care Management (AMB) (Signed)
Care Management   Follow Up Note   05/19/2020 Name: Kimberly [name] MRN: [id number] DOB: 07-05-1956  Referred by: [name], FNP Reason for referral : Care Coordination   Kimberly [name] is a 64 y.o. year old female who is a primary care patient of [name], [place]. The care management team was consulted for assistance with care management and care coordination needs.    Review of patient status, including review of consultants reports, relevant laboratory and other test results, and collaboration with appropriate care team members and the patient's provider was performed as part of comprehensive patient evaluation and provision of chronic care management services.    SDOH (Social Determinants of Health) assessments performed: No See Care Plan activities for detailed interventions related to [facility])     Advanced Directives: See Care Plan and Vynca application for related entries.   Goals Addressed            This Visit's Progress   . Collaborate with RN Care Manager to perform appropriate assessments to assist with care coordination needs       CARE PLAN ENTRY (see longitudinal plan of care for additional care plan information)  Current Barriers:  . Level of care concerns . ADL IADL limitations . Social Isolation . Limited access to caregiver . Ongoing weight loss  Social Work Clinical Goal(s):  [name] Over the next 90 days the patient will work with SW to address care coordination needs  CCM SW Interventions: Completed 05/19/20 . Inbound call received from [facility] with the [facility] CAP program o Informed the patient had an application submitted in both 2019 and June 2021 o Shantae reports application from June 2035 was denied by state Medicaid plan stating "can not proceed" o Determined the [facility] CAP program is unable to access why the application was denied at this time Beecher Mcardle provided SW with the [facility] number [phone]) as well as the  number the Hale Drone [phone]) who is the administrative assistance for the state Medicaid CAP program . Unsuccessful outbound call placed to [employer] to follow up on patient past denied application as well as to inquire patients eligibility for CAP services o Voice message left requesting a return call . Social work to continue to follow  Completed 05/17/20 . Inter-disciplinary care team collaboration (see longitudinal plan of care) . Collaboration with [name], FNP to review past interventions to address abnormal weight loss o Past application processed in an attempt to have patients health plan cover the cost of Ensure o Reviewed the application was denied due to the patient not using Ensure has her primary nutrition source o Informed Kimberly [name] planned to refer the patient to a nutritionist . Collaboration with [name], FNP to review caregiver resources o Discussed the patient is currently receiving PCS hours but is in need of more care o Performed chart review to note SW discussed PACE referral with the patient and her sister in May of 2020- patient declined referral at this time o Discussed SW did not assist with a CAP application as the patients sister reported the patient was placed on the CAP waiting list in October of 2019 . Collaboration with Alicia Amel with the CAP program to request an update on patient application status . Scheduled follow up to the patient over the next 10 days . Collaboration with [place] to review interventions and plan  Patient Self Care Activities:  . Self administers medications as prescribed . Attends  all scheduled provider appointments . Lacks social connections . Unable to perform ADLs independently . Unable to perform IADLs independently  Please see past updates related to this goal by clicking on the "Past Updates" button in the selected goal          Social work will follow up with CAP program  over the next week.  [name], BSW, CDP Social Worker, Certified Dementia Practitioner [place] / [place] [facility] [phone]

## 2020-05-20 ENCOUNTER — Ambulatory Visit: Payer: Medicaid Other | Admitting: Nurse Practitioner

## 2020-05-21 ENCOUNTER — Ambulatory Visit: Payer: Medicaid Other

## 2020-05-21 DIAGNOSIS — R296 Repeated falls: Secondary | ICD-10-CM

## 2020-05-21 DIAGNOSIS — R634 Abnormal weight loss: Secondary | ICD-10-CM

## 2020-05-21 NOTE — Chronic Care Management (AMB) (Signed)
Care Management    Social Work Follow Up Note  05/21/2020 Name: Kimberly [name] MRN: [id number] DOB: September 14, 1955  Kimberly [name] is a 64 y.o. year old female who is a primary care patient of [name], [place]. The CCM team was consulted for assistance with care coordination.   Review of patient status, including review of consultants reports, other relevant assessments, and collaboration with appropriate care team members and the patient's provider was performed as part of comprehensive patient evaluation and provision of chronic care management services.    SDOH (Social Determinants of Health) assessments performed: No    Outpatient Encounter Medications as of 05/21/2020  Medication Sig   amLODipine (NORVASC) 5 MG tablet Take 1&1/2 tablets 7.5 mg daily   atorvastatin (LIPITOR) 80 MG tablet TAKE 1 TABLET BY MOUTH DAILY   Blood Pressure Monitoring (BLOOD PRESSURE KIT) DEVI Use as directed to check blood pressure daily dx: I10   citalopram (CELEXA) 10 MG tablet Take 1 tablet (10 mg total) by mouth daily.   ELIQUIS 5 MG TABS tablet TAKE 1 TABLET BY MOUTH 2 TIMES DAILY (Patient taking differently: Take 5 mg by mouth 2 (two) times daily. )   meclizine (ANTIVERT) 25 MG tablet Take 1 tablet (25 mg total) by mouth 3 (three) times daily as needed for dizziness.   MITIGARE 0.6 MG CAPS TAKE 1 CAPSULE BY MOUTH 2 TIMES DAILY (Patient taking differently: Take 0.6 mg by mouth in the morning and at bedtime. )   omeprazole (PRILOSEC) 20 MG capsule Take 1 capsule (20 mg total) by mouth 2 (two) times daily before a meal.   QC LO-DOSE ASPIRIN 81 MG EC tablet TAKE 1 TABLET BY MOUTH EVERY DAY   No facility-administered encounter medications on file as of 05/21/2020.     Goals Addressed            This Visit's Progress    Collaborate with RN Care Manager to perform appropriate assessments to assist with care coordination needs   On track    Kimberly [name] (see longitudinal plan of care  for additional care plan information)  Current Barriers:   Level of care concerns  ADL IADL limitations  Social Isolation  Limited access to caregiver  Ongoing weight loss  Social Work Clinical Goal(s):   Over the next 90 days the patient will work with SW to address care coordination needs  CCM SW Interventions: Completed 05/21/20 with patient sister Kimberly [name]  Successful outbound call placed to the patients sister and caregiver Kimberly [name]  Discussed the information SW obtained regarding patient denial for CAP program o Advised Kimberly [name] SW has yet to receive feedback from [place] indicating the reason the patient was denied o Determined Kimberly [name] nor the patient received any information in writing informing of denial o Unsuccessful outbound call placed to Kimberly [name] to determine denial reason voice message left requesting a return call  Determined the patient continues to receive 2.5 hours PCS daily x 7 days/week o The patient is currently receiving care from [facility] and sometimes misses days due to staffing challenges o Kimberly [name] reports if a caregiver is unavailable she will take off work to assist the patient o Reviewed challenges with staffing due to [place] pandemic among most home care agencies o Discussed opportunity to request increase in PCS hours; patient last assessment done in summer of 2021 without change in functional status since according to patient sister  Reviewed opportunity to enroll the [facility] [place] [facility]  o Kimberly [name] reports she looked into this resource but decided against it due to the fact the patient would have to remain at the day center the entire day - "she gets tired a lot and can't stay there the entire day"  Scheduled follow up call over the next week  Completed 05/19/20  Inbound call received from Kimberly [name] with the [facility] CAP program o Informed the patient had an application submitted in both 2019 and June 2021 o Kimberly [name] reports  application from June 2248 was denied by state Medicaid plan stating "can not proceed" o Determined the [place] CAP program is unable to access why the application was denied at this time Kimberly [name] provided SW with the [facility] number [phone]) as well as the number the Kimberly [name] [phone]) who is the administrative assistance for the state Medicaid CAP program  Unsuccessful outbound call placed to Kimberly [name] to follow up on patient past denied application as well as to inquire patients eligibility for CAP services o Voice message left requesting a return call  Social work to continue to follow  Completed 05/17/20  Inter-disciplinary care team collaboration (see longitudinal plan of care)  Collaboration with [name], FNP to review past interventions to address abnormal weight loss o Past application processed in an attempt to have patients health plan cover the cost of Ensure o Reviewed the application was denied due to the patient not using Ensure has her primary nutrition source o Informed [name] planned to refer the patient to a nutritionist  Collaboration with [name], FNP to review caregiver resources o Discussed the patient is currently receiving PCS hours but is in need of more care o Performed chart review to note SW discussed [facility]ACE referral with the patient and her sister in May of 2020- patient declined referral at this time o Discussed SW did not assist with a CAP application as the patients sister reported the patient was placed on the CAP waiting list in October of 2019  Collaboration with Alicia Amel with the CAP program to request an update on patient application status  Scheduled follow up to the patient over the next 10 days  Collaboration with [place] to review interventions and plan  Patient Self Care Activities:   Self administers medications as prescribed  Attends all scheduled provider  appointments  Lacks social connections  Unable to perform ADLs independently  Unable to perform IADLs independently  Please see past updates related to this goal by clicking on the "Past Updates" button in the selected goal          Follow Up Plan: SW will follow up with patient by phone over the next week.   [name], BSW, CDP Social Worker, Certified Dementia Practitioner [place] [facility] [place] [facility] [phone]

## 2020-05-21 NOTE — Patient Instructions (Signed)
Social Worker Visit Information  Goals we discussed today:  Goals Addressed            This Visit's Progress   . Collaborate with RN Care Manager to perform appropriate assessments to assist with care coordination needs   On track    [place] (see longitudinal plan of care for additional care plan information)  Current Barriers:  . Level of care concerns . ADL IADL limitations . Social Isolation . Limited access to caregiver . Ongoing weight loss  Social Work Clinical Goal(s):  [name] Over the next 90 days the patient will work with SW to address care coordination needs  CCM SW Interventions: Completed 05/21/20 with patient sister [name] . Successful outbound call placed to the patients sister and caregiver [name] . Discussed the information SW obtained regarding patient denial for CAP program o Advised [name] SW has yet to receive feedback from [place] indicating the reason the patient was denied o Determined [name] nor the patient received any information in writing informing of denial o Unsuccessful outbound call placed to [facility] to determine denial reason voice message left requesting a return call . Determined the patient continues to receive 2.5 hours PCS daily x 7 days/week o The patient is currently receiving care from [facility] and sometimes misses days due to staffing challenges o [name] reports if a caregiver is unavailable she will take off work to assist the patient o Reviewed challenges with staffing due to [place] pandemic among most home care agencies o Discussed opportunity to request increase in PCS hours; patient last assessment done in summer of 2021 without change in functional status since according to patient sister . Reviewed opportunity to enroll the patient in [facility]  o [name] reports she looked into this resource but decided against it due to the fact the patient would have to remain at the day center the entire day - "she gets  tired a lot and can't stay there the entire day" . Scheduled follow up call over the next week  Completed 05/19/20 . Inbound call received from [facility] with the [facility] CAP program o Informed the patient had an application submitted in both 2019 and June 2021 o [place] reports application from June 9509 was denied by state Medicaid plan stating "can not proceed" o Determined the [facility] CAP program is unable to access why the application was denied at this time Beecher Mcardle provided SW with the [facility] number [phone]) as well as the number the [facility] [phone]) who is the administrative assistance for the state Medicaid CAP program . Unsuccessful outbound call placed to [facility] to follow up on patient past denied application as well as to inquire patients eligibility for CAP services o Voice message left requesting a return call . Social work to continue to follow  Completed 05/17/20 . Inter-disciplinary care team collaboration (see longitudinal plan of care) . Collaboration with [name], FNP to review past interventions to address abnormal weight loss o Past application processed in an attempt to have patients health plan cover the cost of Ensure o Reviewed the application was denied due to the patient not using Ensure has her primary nutrition source o Informed [name] planned to refer the patient to a nutritionist . Collaboration with [name], FNP to review caregiver resources o Discussed the patient is currently receiving PCS hours but is in need of more care o Performed chart review to note SW discussed [facility] referral with  the patient and her sister in May of 2020- patient declined referral at this time o Discussed SW did not assist with a CAP application as the patients sister reported the patient was placed on the CAP waiting list in October of 2019 . Collaboration with Alicia Amel with the CAP program to request an update on patient  application status . Scheduled follow up to the patient over the next 10 days . Collaboration with [place] to review interventions and plan  Patient Self Care Activities:  . Self administers medications as prescribed . Attends all scheduled provider appointments . Lacks social connections . Unable to perform ADLs independently . Unable to perform IADLs independently  Please see past updates related to this goal by clicking on the "Past Updates" button in the selected goal          Follow Up Plan: SW will follow up with patient by phone over the next week.   [name], BSW, CDP Social Worker, Certified Dementia Practitioner [place] [facility] [place] [facility] [phone]

## 2020-05-28 ENCOUNTER — Telehealth: Payer: Medicaid Other

## 2020-05-28 ENCOUNTER — Telehealth: Payer: Self-pay

## 2020-05-28 NOTE — Telephone Encounter (Signed)
°  Chronic Care Management   Outreach Note  05/28/2020 Name: Kimberly [name] MRN: [id number] DOB: 27-Apr-1956  Referred by: [name], FNP Reason for referral : Care Coordination   An unsuccessful telephone outreach was attempted today. The patient was referred to the case management team for assistance with care management and care coordination.   Follow Up Plan: A HIPAA compliant phone message was left for the patient providing contact information and requesting a return call.  The care management team will reach out to the patient again over the next 21 days.   [name], BSW, CDP Social Worker, Certified Dementia Practitioner [place] [facility] [place] [facility] [phone]

## 2020-06-02 ENCOUNTER — Telehealth: Payer: Self-pay

## 2020-06-09 NOTE — Progress Notes (Deleted)
Cardiology Office Note   Date:  06/09/2020   ID:  Maanasa, A[place] 1955-09-19, MRN [id number]  PCP:  [name], FNP  Cardiologist:   [name], MD   No chief complaint on file.     History of Present Illness: Kimberly [name] is a 64 y.o. female who presents for follow up of CAD and CABG.  She had CVA with PCA subacute infarct in 2019.  Since I last saw her ***   ***  she has had progressive weight loss.  That is why we are seeing her recently because she was seen preprocedure prior to having colonoscopy.  There is no clear etiology apparently to the weight loss.  She does have depression which may be contributing and she is going to see a psychiatrist.  She gets around slowly with a walker and with canes.  She has had weakness following her stroke and balance issues.  She does get some mild shortness of breath with activity but this is at baseline.  She is not having any PND or orthopnea.  She has an occasional palpitations.  She has not had any presyncope or syncope.  She does have occasional chest discomfort.  This is midsternal.  It is mild to moderate in activity.  It comes on with rest.  She is not sure if this is similar to her previous angina.  She is not describing neck or arm discomfort.  She is not having any associated nausea vomiting or diaphoresis.    Past Medical History:  Diagnosis Date  . Bicipital tenosynovitis   . Coronary artery disease 05/2007   [id number]   . Fall 02/13/2018  . Family history of ischemic heart disease   . Gout   . Hammer toe   . Hip, thigh, leg, and ankle, insect bite, nonvenomous, without mention of infection(916.4)   . Hyperlipidemia   . Hypertension   . Other abnormality of red blood cells   . Rotator cuff syndrome of left shoulder   . Stroke [facility])     Past Surgical History:  Procedure Laterality Date  . CARDIAC CATHETERIZATION    . COLONOSCOPY WITH PROPOFOL N/A 01/30/2020   Procedure: COLONOSCOPY WITH PROPOFOL;   Surgeon: Carol Ada, MD;  Location: [facility] ENDOSCOPY;  Service: Endoscopy;  Laterality: N/A;  . CORONARY ARTERY BYPASS GRAFT     triple  . CORONARY ARTERY BYPASS GRAFT  2008  . POLYPECTOMY  01/30/2020   Procedure: POLYPECTOMY;  Surgeon: Carol Ada, MD;  Location: [facility] ENDOSCOPY;  Service: Endoscopy;;  . TUBAL LIGATION       Current Outpatient Medications  Medication Sig Dispense Refill  . amLODipine (NORVASC) 5 MG tablet Take 1&1/2 tablets 7.5 mg daily 135 tablet 3  . atorvastatin (LIPITOR) 80 MG tablet TAKE 1 TABLET BY MOUTH DAILY 90 tablet 1  . Blood Pressure Monitoring (BLOOD PRESSURE KIT) DEVI Use as directed to check blood pressure daily dx: I10 1 each 1  . citalopram (CELEXA) 10 MG tablet Take 1 tablet (10 mg total) by mouth daily. 30 tablet 2  . ELIQUIS 5 MG TABS tablet TAKE 1 TABLET BY MOUTH 2 TIMES DAILY (Patient taking differently: Take 5 mg by mouth 2 (two) times daily. ) 180 tablet 3  . meclizine (ANTIVERT) 25 MG tablet Take 1 tablet (25 mg total) by mouth 3 (three) times daily as needed for dizziness. 30 tablet 0  . MITIGARE 0.6 MG CAPS TAKE 1 CAPSULE BY MOUTH 2 TIMES DAILY (Patient taking  differently: Take 0.6 mg by mouth in the morning and at bedtime. ) 180 capsule 1  . omeprazole (PRILOSEC) 20 MG capsule Take 1 capsule (20 mg total) by mouth 2 (two) times daily before a meal. 180 capsule 1  . QC LO-DOSE ASPIRIN 81 MG EC tablet TAKE 1 TABLET BY MOUTH EVERY DAY 90 tablet 1   No current facility-administered medications for this visit.    Allergies:   Clonidine derivatives and Spironolactone    ROS:  Please see the history of present illness.   Otherwise, review of systems are positive for ***.   All other systems are reviewed and negative.    PHYSICAL EXAM: VS:  There were no vitals taken for this visit. , BMI There is no height or weight on file to calculate BMI. GENERAL:  Well appearing NECK:  No jugular venous distention, waveform within normal limits, carotid  upstroke brisk and symmetric, no bruits, no thyromegaly LUNGS:  Clear to auscultation bilaterally CHEST:  Unremarkable HEART:  PMI not displaced or sustained,S1 and S2 within normal limits, no S3, no S4, no clicks, no rubs, *** murmurs ABD:  Flat, positive bowel sounds normal in frequency in pitch, no bruits, no rebound, no guarding, no midline pulsatile mass, no hepatomegaly, no splenomegaly EXT:  2 plus pulses throughout, no edema, no cyanosis no clubbing   *** GEN:  No distress, thin and frail.  NECK:  No jugular venous distention at 90 degrees, waveform within normal limits, carotid upstroke brisk and symmetric, no bruits, no thyromegaly LYMPHATICS:  No cervical adenopathy LUNGS:  Clear to auscultation bilaterally BACK:  No CVA tenderness CHEST:  Unremarkable HEART:  S1 and S2 within normal limits, no S3, no S4, no clicks, no rubs, no murmurs ABD:  Positive bowel sounds normal in frequency in pitch, no bruits, no rebound, no guarding, unable to assess midline mass or bruit with the patient seated. EXT:  2 plus pulses throughout, mild edema, no cyanosis no clubbing SKIN:  No rashes no nodules NEURO:  Cranial nerves II through XII grossly intact, motor grossly intact throughout PSYCH:  Cognitively intact, oriented to person place and time    EKG:  EKG is not *** ordered today. Of note she has previously had markedly abnormal EKG with LVH and repolarization changes with diffuse T wave inversions.   Recent Labs: 12/02/2019: ALT 30; TSH 1.550 04/27/2020: BUN 12; Creatinine, Ser 0.83; Hemoglobin 11.1; Platelets 191; Potassium 3.9; Sodium 142    Lipid Panel    Component Value Date/Time   CHOL 114 09/02/2019 1651   TRIG 58 09/02/2019 1651   HDL 47 09/02/2019 1651   CHOLHDL 2.4 09/02/2019 1651   CHOLHDL 3.6 02/14/2018 0314   VLDL 16 02/14/2018 0314   LDLCALC 54 09/02/2019 1651      Wt Readings from Last 3 Encounters:  04/25/20 118 lb (53.5 kg)  04/01/20 112 lb (50.8 kg)    03/18/20 112 lb 12.8 oz (51.2 kg)      Other studies Reviewed: Additional studies/ records that were reviewed today include: ***. Review of the above records demonstrates:  Please see elsewhere in the note.     ASSESSMENT AND PLAN:  CAD:     *** She does have chest discomfort.  Somewhat atypical.  Her EKG would be relatively uninterpretable.  She needs to have stress testing but would not be able to walk on a treadmill.  Therefore, she will have a [employer].  HTN:   Her BP is *** Blood  pressure is elevated.  She was just started on Norvasc but I am going to increase this to 7.5 mg daily.   CAROTID STENOSIS:  She had bilateral carotid occlusion .  *** She will have this managed medically.  PAF:  Kimberly [name] has a CHA2DS2 - VASc score of 4. *** She tolerates anticoagulation and will continue with meds as listed.  She does have some mild anemia but this has been evaluated and there is no active bleeding apparently.  HYPERLIPIDEMIA:    LDL was *** 54 earlier this year.  No change in therapy.   TOBACCO ABUSE:    ***  She still smoking cigarettes.  We talked about this and she has been unable to quit.   COVID EDUCATION: She has been vaccinated.  Current medicines are reviewed at length with the patient today.  The patient does not have concerns regarding medicines.  The following changes have been made:   ***  Labs/ tests ordered today include: ***  No orders of the defined types were placed in this encounter.    Disposition:   FU with ***  Signed, [name], MD  06/09/2020 6:56 PM    [facility]

## 2020-06-10 ENCOUNTER — Ambulatory Visit: Payer: Medicaid Other | Admitting: Cardiology

## 2020-06-10 DIAGNOSIS — I480 Paroxysmal atrial fibrillation: Secondary | ICD-10-CM

## 2020-06-10 DIAGNOSIS — E785 Hyperlipidemia, unspecified: Secondary | ICD-10-CM

## 2020-06-10 DIAGNOSIS — I10 Essential (primary) hypertension: Secondary | ICD-10-CM

## 2020-06-10 DIAGNOSIS — I6523 Occlusion and stenosis of bilateral carotid arteries: Secondary | ICD-10-CM

## 2020-06-10 DIAGNOSIS — I2510 Atherosclerotic heart disease of native coronary artery without angina pectoris: Secondary | ICD-10-CM

## 2020-06-10 DIAGNOSIS — Z720 Tobacco use: Secondary | ICD-10-CM

## 2020-06-11 ENCOUNTER — Ambulatory Visit: Payer: Medicaid Other

## 2020-06-11 DIAGNOSIS — F32A Depression, unspecified: Secondary | ICD-10-CM

## 2020-06-11 DIAGNOSIS — I10 Essential (primary) hypertension: Secondary | ICD-10-CM

## 2020-06-11 DIAGNOSIS — R634 Abnormal weight loss: Secondary | ICD-10-CM

## 2020-06-11 NOTE — Chronic Care Management (AMB) (Signed)
Care Management   Follow Up Note   06/11/2020 Name: Kimberly [name] MRN: [id number] DOB: [birth year]  Referred by: [name], FNP Reason for referral : Care Coordination   Kimberly [name] is a 64 y.o. year old female who is a primary care patient of [name], [place]. The care management team was consulted for assistance with care management and care coordination needs.    Review of patient status, including review of consultants reports, relevant laboratory and other test results, and collaboration with appropriate care team members and the patient's provider was performed as part of comprehensive patient evaluation and provision of chronic care management services.    SDOH (Social Determinants of Health) assessments performed: No    Advanced Directives: See Care Plan and Vynca application for related entries.   Goals Addressed            This Visit's Progress   . Collaborate with RN Care Manager to perform appropriate assessments to assist with care coordination needs       CARE PLAN ENTRY (see longitudinal plan of care for additional care plan information)  Current Barriers:  . Level of care concerns . ADL IADL limitations . Social Isolation . Limited access to caregiver . Ongoing weight loss  Social Work Clinical Goal(s):  [name] Over the next 90 days the patient will work with SW to address care coordination needs   SW Interventions: Completed 06/11/20 with patient sister Kimberly [name] . Successful outbound call placed to the patients sister to assist with care coordination needs . Advised Kimberly [name] SW has yet to get a response from the [facility] regarding reason patient was denied for CAP/DA services o Discussed Kimberly [name] plans to contact DSS to re-apply on behalf of the patient . Discussed Kimberly [name] is interested in applying for Section 8 housing for the patient o Kimberly [name] reports lack of access to a computer to complete application o Provided Kimberly [name] with the contact number to  [place] to contact the [place] to complete an application via phone . Determined the patient is having a hard time affording medications o Patient has compliance packaging from [place] o Due to recent medication changes the patient has had to have packaging updated since last fill date o Kimberly [name] reports having to pay OOP $25+ each time the packaging is changed o Kimberly [name] reports she is no longer able to afford to assist with medication needs o SW placed pharmacy referral to review patient pharmacy options for more affordable compliance packaging  Completed 05/21/20 with patient sister Kimberly [name] . Successful outbound call placed to the patients sister and caregiver Kimberly [name] . Discussed the information SW obtained regarding patient denial for CAP program o Advised Kimberly [name] SW has yet to receive feedback from [place] indicating the reason the patient was denied o Determined Kimberly [name] nor the patient received any information in writing informing of denial o Unsuccessful outbound call placed to [facility] to determine denial reason voice message left requesting a return call . Determined the patient continues to receive 2.5 hours PCS daily x 7 days/week o The patient is currently receiving care from [facility] and sometimes misses days due to staffing challenges o Kimberly [name] reports if a caregiver is unavailable she will take off work to assist the patient o Reviewed challenges with staffing due to [place] pandemic among most home care agencies o Discussed opportunity to request increase in PCS hours; patient last assessment done in summer of 2021 without change in functional status  since according to patient sister . Reviewed opportunity to [place] the [facility] [place] [facility]  o Kimberly [name] reports she looked into this resource but decided against it due to the fact the patient would have to remain at the day center the entire day - "she gets tired a lot and  can't stay there the entire day" . Scheduled follow up call over the next week  Completed 05/19/20 . Inbound call received from [facility] with the [facility] CAP program o Informed the patient had an application submitted in both 2019 and June 2021 o [place] reports application from June 1157 was denied by state Medicaid plan stating "can not proceed" o Determined the [facility] CAP program is unable to access why the application was denied at this time Beecher Mcardle provided SW with the [facility] number [phone]) as well as the number the [facility] [phone]) who is the administrative assistance for the state Medicaid CAP program . Unsuccessful outbound call placed to [facility] to follow up on patient past denied application as well as to inquire patients eligibility for CAP services o Voice message left requesting a return call . Social work to continue to follow  Completed 05/17/20 . Inter-disciplinary care team collaboration (see longitudinal plan of care) . Collaboration with [name], FNP to review past interventions to address abnormal weight loss o Past application processed in an attempt to have patients health plan cover the cost of Ensure o Reviewed the application was denied due to the patient not using Ensure has her primary nutrition source o Informed [name] planned to refer the patient to a nutritionist . Collaboration with [name], FNP to review caregiver resources o Discussed the patient is currently receiving PCS hours but is in need of more care o Performed chart review to note SW discussed PACE referral with the patient and her sister in May of 2020- patient declined referral at this time o Discussed SW did not assist with a CAP application as the patients sister reported the patient was placed on the CAP waiting list in October of 2019 . Collaboration with Alicia Amel with the CAP program to request an update on patient application  status . Scheduled follow up to the patient over the next 10 days . Collaboration with [place] to review interventions and plan  Patient Self Care Activities:  . Self administers medications as prescribed . Attends all scheduled provider appointments . Lacks social connections . Unable to perform ADLs independently . Unable to perform IADLs independently  Please see past updates related to this goal by clicking on the "Past Updates" button in the selected goal          The care management team will reach out to the patient again over the next 21 days.   [name], BSW, CDP Social Worker, Certified Dementia Practitioner [place] / [place] [facility] [phone]

## 2020-06-11 NOTE — Patient Instructions (Signed)
Visit Information  Goals Addressed            This Visit's Progress   . Collaborate with RN Care Manager to perform appropriate assessments to assist with care coordination needs       CARE PLAN ENTRY (see longitudinal plan of care for additional care plan information)  Current Barriers:  . Level of care concerns . ADL IADL limitations . Social Isolation . Limited access to caregiver . Ongoing weight loss  Social Work Clinical Goal(s):  [name] Over the next 90 days the patient will work with SW to address care coordination needs  SW Interventions: Completed 06/11/20 with patient sister [name] . Successful outbound call placed to the patients sister to assist with care coordination needs . Advised [name] SW has yet to get a response from the [facility] regarding reason patient was denied for CAP/DA services o Discussed [name] plans to contact DSS to re-apply on behalf of the patient . Discussed [name] is interested in applying for Section 8 housing for the patient o [name] reports lack of access to a computer to complete application o Provided [name] with the contact number to [place] to contact the [place] to complete an application via phone . Determined the patient is having a hard time affording medications o Patient has compliance packaging from [place] o Due to recent medication changes the patient has had to have packaging updated since last fill date o [name] reports having to pay OOP $25+ each time the packaging is changed o [name] reports she is no longer able to afford to assist with medication needs o SW placed pharmacy referral to review patient pharmacy options for more affordable compliance packaging  Completed 05/21/20 with patient sister [name] . Successful outbound call placed to the patients sister and caregiver [name] . Discussed the information SW obtained regarding patient denial for CAP program o Advised [name] SW has  yet to receive feedback from [place] indicating the reason the patient was denied o Determined [name] nor the patient received any information in writing informing of denial o Unsuccessful outbound call placed to [facility] to determine denial reason voice message left requesting a return call . Determined the patient continues to receive 2.5 hours PCS daily x 7 days/week o The patient is currently receiving care from [facility] and sometimes misses days due to staffing challenges o [name] reports if a caregiver is unavailable she will take off work to assist the patient o Reviewed challenges with staffing due to [place] pandemic among most home care agencies o Discussed opportunity to request increase in PCS hours; patient last assessment done in summer of 2021 without change in functional status since according to patient sister . Reviewed opportunity to enroll the patient in [facility]  o [name] reports she looked into this resource but decided against it due to the fact the patient would have to remain at the day center the entire day - "she gets tired a lot and can't stay there the entire day" . Scheduled follow up call over the next week  Completed 05/19/20 . Inbound call received from [facility] with the [facility] CAP program o Informed the patient had an application submitted in both 2019 and June 2021 o [place] reports application from June 9417 was denied by state Medicaid plan stating "can not proceed" o Determined the [facility] CAP program is unable to access why the application was denied at this time o [place] provided  SW with the [facility] number [phone]) as well as the number the [facility] [phone]) who is the administrative assistance for the state Medicaid CAP program . Unsuccessful outbound call placed to [facility] to follow up on patient past denied application as well as to inquire patients eligibility for CAP services o Voice message  left requesting a return call . Social work to continue to follow  Completed 05/17/20 . Inter-disciplinary care team collaboration (see longitudinal plan of care) . Collaboration with [name], FNP to review past interventions to address abnormal weight loss o Past application processed in an attempt to have patients health plan cover the cost of Ensure o Reviewed the application was denied due to the patient not using Ensure has her primary nutrition source o Informed [name] planned to refer the patient to a nutritionist . Collaboration with [name], FNP to review caregiver resources o Discussed the patient is currently receiving PCS hours but is in need of more care o Performed chart review to note SW discussed PACE referral with the patient and her sister in May of 2020- patient declined referral at this time o Discussed SW did not assist with a CAP application as the patients sister reported the patient was placed on the CAP waiting list in October of 2019 . Collaboration with Alicia Amel with the CAP program to request an update on patient application status . Scheduled follow up to the patient over the next 10 days . Collaboration with [place] to review interventions and plan  Patient Self Care Activities:  . Self administers medications as prescribed . Attends all scheduled provider appointments . Lacks social connections . Unable to perform ADLs independently . Unable to perform IADLs independently  Please see past updates related to this goal by clicking on the "Past Updates" button in the selected goal         The care management team will reach out to the patient again over the next 21 days.   [name], BSW, CDP Social Worker, Certified Dementia Practitioner [place] / Day Management [phone]

## 2020-06-18 ENCOUNTER — Telehealth: Payer: Self-pay | Admitting: *Deleted

## 2020-06-18 NOTE — Chronic Care Management (AMB) (Signed)
  Care Management   Note  06/18/2020 Name: Kimberly [name] MRN: [id number] DOB: 28-Jul-1956  Kimberly [name] is a 64 y.o. year old female who is a primary care patient of [name], [place]. I reached out to [place] by phone today in response to a referral sent by Kimberly [name] health plan.    Kimberly [name] was given information about care management services today including:  1. Care management services include personalized support from designated clinical staff supervised by her physician, including individualized plan of care and coordination with other care providers 2. 24/7 contact phone numbers for assistance for urgent and routine care needs. 3. The patient may stop care management services at any time by phone call to the office staff.  Patient agreed to services and verbal consent obtained.   Follow up plan: Telephone appointment with care management team member scheduled for:06/30/2020  [place] [facility]  Direct Dial: [phone]

## 2020-06-29 ENCOUNTER — Telehealth: Payer: Self-pay

## 2020-06-29 NOTE — Progress Notes (Signed)
06/29/2020- Per [name], CPP patient will be referred to [name], CPP.  [name], [place] Pharmacist Assistant [phone]

## 2020-06-30 ENCOUNTER — Telehealth: Payer: Medicaid Other

## 2020-07-01 ENCOUNTER — Telehealth: Payer: Medicaid Other

## 2020-07-02 ENCOUNTER — Telehealth: Payer: Self-pay

## 2020-07-02 NOTE — Chronic Care Management (AMB) (Signed)
Chronic Care Management Pharmacy Assistant   Name: Kimberly [name]  MRN: [id number] DOB: 01/03/1956  Reason for Encounter: Medication Review and Initial Questions  PCP : [name], [place]  07/02/2020- Patient referred to [facility] Pharmacist to review medications and coordinate enhanced pharmacy packaging services for a lower cost. Spoke with sister Kimberly [name], informed I will contact [facility] to inquire on pricing for medications to help with cost, patient is expecting a delivery for Monday, informed if she does not hear back from me to continue with the next 30 days of packaging medication from [facility].  [place] for inquiry on medications and prices, they will need  prescriptions first to check. [name] notified, will request from PCP and I will call Dr Percival Spanish (Cardiology) office to request refill of Amlodipine.  07/12/2020- Requested Refill from PCP office for medications to be sent to [facility], sent insurance card and patient information to [facility] pharmacy and notified medications will be sent to run test claims for pricing. Will call Dr Percival Spanish office to request refill of Amlodipine.   Allergies:   Allergies  Allergen Reactions  . Clonidine Derivatives Other (See Comments)    Patch causes scars  . Spironolactone Rash    Medications: Outpatient Encounter Medications as of 07/02/2020  Medication Sig  . amLODipine (NORVASC) 5 MG tablet Take 1&1/2 tablets 7.5 mg daily  . atorvastatin (LIPITOR) 80 MG tablet TAKE 1 TABLET BY MOUTH DAILY  . Blood Pressure Monitoring (BLOOD PRESSURE KIT) DEVI Use as directed to check blood pressure daily dx: I10  . citalopram (CELEXA) 10 MG tablet Take 1 tablet (10 mg total) by mouth daily.  [name] ELIQUIS 5 MG TABS tablet TAKE 1 TABLET BY MOUTH 2 TIMES DAILY (Patient taking differently: Take 5 mg by mouth 2 (two) times daily. )  . meclizine (ANTIVERT) 25 MG tablet Take 1 tablet (25 mg total) by mouth 3 (three)  times daily as needed for dizziness.  [name] MITIGARE 0.6 MG CAPS TAKE 1 CAPSULE BY MOUTH 2 TIMES DAILY (Patient taking differently: Take 0.6 mg by mouth in the morning and at bedtime. )  . omeprazole (PRILOSEC) 20 MG capsule Take 1 capsule (20 mg total) by mouth 2 (two) times daily before a meal.  . QC LO-DOSE ASPIRIN 81 MG EC tablet TAKE 1 TABLET BY MOUTH EVERY DAY   No facility-administered encounter medications on file as of 07/02/2020.    Current Diagnosis: Patient Active Problem List   Diagnosis Date Noted  . Educated about COVID-19 virus infection 11/16/2019  . Urinary frequency 04/14/2019  . Cough 04/14/2019  . AKI (acute kidney injury) ([place]) 11/26/2018  . Diarrhea 11/25/2018  . Dehydration, mild 11/25/2018  . Hemiparesis affecting nondominant 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 ([place]) 07/31/2018  . Muscle ache 07/31/2018  . Prediabetes 07/31/2018  . Chronic gout without tophus 07/31/2018  . Vision loss 07/31/2018  . Malnutrition of moderate degree 02/15/2018  . Carotid occlusion, bilateral   . Atrial fibrillation with RVR ([place]) 02/13/2018  . Positive D dimer 02/13/2018  . Back pain 02/13/2018  . CVA (cerebral vascular accident) ([place]) 02/13/2018  . Paroxysmal atrial fibrillation ([place]) 02/13/2018  . Coronary artery disease involving native coronary artery of native heart without angina pectoris 02/13/2018  . Apical variant hypertrophic cardiomyopathy ([place]) 02/13/2018  . 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 Aortic atherosclerosis ([place]) 02/13/2018  . CAD, ARTERY BYPASS GRAFT 05/03/2009  . TOBACCO USE, QUIT 05/03/2009  . ROTATOR CUFF SYNDROME,  LEFT 02/12/2009  . Shoulder pain, left 11/03/2008  . HAMMER TOE, ACQUIRED 02/11/2008  . INSECT BITE, LEG 02/11/2008  . Cerebral artery occlusion with cerebral infarction ([place]) 12/05/2007  . Dyslipidemia 11/05/2007  . UNSPECIFIED DISORDER TEETH&SUPPORTING STRUCTURES 11/05/2007  . BICEPS TENDINITIS, RIGHT 04/24/2007  .  Essential hypertension 04/23/2007  . MICROCYTOSIS 04/23/2007    Follow-Up:  Care Coordination with Outside Provider, Coordination of Enhanced Pharmacy Services, Medication Cost Review and Pharmacist Review   07/13/2020- Waiting on prescriptions to be sent to [facility] to run test claims for patient. Will follow up with patient's sister Kimberly [name]. [name], CPP notified.  [name], [place] Pharmacist Assistant [phone]

## 2020-07-07 ENCOUNTER — Telehealth: Payer: Self-pay

## 2020-07-07 ENCOUNTER — Telehealth: Payer: Medicaid Other

## 2020-07-07 NOTE — Telephone Encounter (Signed)
  Chronic Care Management   Outreach Note  07/07/2020 Name: Kimberly [name] MRN: [id number] DOB: January 31, 1956  Referred by: [name], FNP Reason for referral : Care Coordination   An unsuccessful telephone outreach was attempted today. The patient was referred to the case management team for assistance with care management and care coordination.   Follow Up Plan: The care management team will reach out to the patient again over the next 21 days.   [name], BSW, CDP Social Worker, Certified Dementia Practitioner [place] / [place] Management [phone]

## 2020-07-14 ENCOUNTER — Other Ambulatory Visit: Payer: Self-pay

## 2020-07-14 DIAGNOSIS — F32A Depression, unspecified: Secondary | ICD-10-CM

## 2020-07-14 MED ORDER — CITALOPRAM HYDROBROMIDE 10 MG PO TABS: 10.0000 mg | ORAL_TABLET | Freq: Every day | ORAL | 2 refills | Status: DC

## 2020-07-14 MED ORDER — MITIGARE 0.6 MG PO CAPS: ORAL_CAPSULE | ORAL | 1 refills | Status: DC

## 2020-07-14 MED ORDER — ATORVASTATIN CALCIUM 80 MG PO TABS
80.0000 mg | ORAL_TABLET | Freq: Every day | ORAL | 1 refills | Status: DC
Start: 2020-07-14 — End: 2020-08-18

## 2020-07-14 MED ORDER — ASPIRIN 81 MG PO TBEC
81.0000 mg | DELAYED_RELEASE_TABLET | Freq: Every day | ORAL | 1 refills | Status: DC
Start: 2020-07-14 — End: 2020-11-08

## 2020-07-14 MED ORDER — APIXABAN 5 MG PO TABS
5.0000 mg | ORAL_TABLET | Freq: Two times a day (BID) | ORAL | 3 refills | Status: DC
Start: 2020-07-14 — End: 2021-01-24

## 2020-07-19 ENCOUNTER — Telehealth: Payer: Medicaid Other

## 2020-07-19 ENCOUNTER — Ambulatory Visit: Payer: Self-pay

## 2020-07-19 ENCOUNTER — Other Ambulatory Visit: Payer: Self-pay

## 2020-07-22 ENCOUNTER — Other Ambulatory Visit: Payer: Self-pay | Admitting: Nurse Practitioner

## 2020-07-22 ENCOUNTER — Telehealth: Payer: Self-pay

## 2020-07-22 ENCOUNTER — Telehealth: Payer: Medicaid Other

## 2020-07-22 DIAGNOSIS — R630 Anorexia: Secondary | ICD-10-CM

## 2020-07-22 MED ORDER — MEGESTROL ACETATE 20 MG PO TABS: 20.0000 mg | ORAL_TABLET | Freq: Every day | ORAL | 3 refills | Status: DC

## 2020-07-22 NOTE — Patient Instructions (Signed)
Visit Information  Goals Addressed      Patient Stated   .  to feel less depressed and hopeless (pt-stated)        Timeframe:  Long-Range Goal Priority:  High Start Date: 07/21/20                            Expected End Date: 10/19/20                      Follow Up Date 07/29/20    - stay in touch with my family and friends - follow up with PCP for ongoing evaluation and treatment of depression     Why is this important?    As we age, or sometimes because we have an illness, it feels like our memory and ability to figure things out is not very good.   There are things you can do to keep your memory and your thinking as strong as possible.         .  Acheive and Maintain adequete weight gain        Timeframe:  Short-Term Goal Priority:  High Start Date: 07/21/20                            Expected End Date:08/21/20                      Follow Up Date 07/29/20   - set goal weight  - obtain weight scale for home use - monitor weights daily and record, weighing the same time each morning - follow up with the Registered Dietician to help monitor Abnormal weight loss  - Continue drinking high protein oral supplements as prescribed    Why is this important?    When you are ready to manage your nutrition or weight, having a plan and setting goals will help.   Taking small steps to change how you eat and exercise is a good place to start.       .  Track and Manage My Blood Pressure-Hypertension        Timeframe:  Long-Range Goal Priority:  High Start Date: 07/21/20                            Expected End Date: 10/19/20                       Follow Up Date 07/29/20   - Obtain a BP cuff for home use   - Self administer medications as prescribed  - Attend all scheduled provider appointments  - Call provider office for new concerns, questions, or BP outside discussed parameters  - Check BP and record as discussed  - Follows a low sodium diet   Why is this important?    You  won't feel high blood pressure, but it can still hurt your blood vessels.   High blood pressure can cause heart or kidney problems. It can also cause a stroke.   Making lifestyle changes like losing a Kimberly [name] weight or eating less salt will help.   Checking your blood pressure at home and at different times of the day can help to control blood pressure.   If the doctor prescribes medicine remember to take it the way the doctor ordered.   Call the office if  you cannot afford the medicine or if there are questions about it.       The patient verbalized understanding of instructions, educational materials, and care plan provided today and declined offer to receive copy of patient instructions, educational materials, and care plan.   Telephone follow up appointment with care management team member scheduled for: 07/29/20  Lynne Logan, RN

## 2020-07-22 NOTE — Telephone Encounter (Signed)
  Chronic Care Management   Outreach Note  07/22/2020 Name: Kimberly [name] MRN: [id number] DOB: 1956-03-21  Referred by: [name], FNP Reason for referral : [place] is enrolled in a Managed Medicaid Health Plan: No  A second unsuccessful telephone outreach was attempted today. The patient was referred to the case management team for assistance with care management and care coordination.   Follow Up Plan: The care management team will reach out to the patient again over the next 30 days.   [name], BSW, CDP Social Worker, Certified Dementia Practitioner [place] / [place] Management [phone]

## 2020-07-22 NOTE — Chronic Care Management (AMB) (Signed)
Care Management   Follow Up Note   07/22/2020 Name: Kimberly [name] MRN: [id number] DOB: 1955/12/14  Referred by: [name], FNP Reason for referral : Care Coordination (CC RNCM FU Call )  Objective: Lab Results  Component Value Date   HGBA1C 5.5 09/02/2019   HGBA1C 5.9 (H) 07/24/2018   HGBA1C 5.8 (H) 02/14/2018   Lab Results  Component Value Date   LDLCALC 54 09/02/2019   CREATININE 0.83 04/27/2020   BP Readings from Last 3 Encounters:  05/17/20 122/68  04/27/20 (!) 120/91  04/25/20 (!) 134/99    Assessment: Kimberly [name] is a 64 y.o. year old female who is a primary care patient of [name], [place]. The CCM team was consulted for assistance with chronic disease management and care coordination needs.   Review of patient status, including review of consultants reports, relevant laboratory and other test results, and collaboration with appropriate care team members and the patient's provider was performed as part of comprehensive patient evaluation and provision of care management services.    SDOH (Social Determinants of Health) screening performed today: Food Insecurity . See Care Plan for related entries.   Outpatient Encounter Medications as of 07/19/2020  Medication Sig  . amLODipine (NORVASC) 5 MG tablet Take 1&1/2 tablets 7.5 mg daily  . apixaban (ELIQUIS) 5 MG TABS tablet Take 1 tablet (5 mg total) by mouth 2 (two) times daily.  [name] aspirin (QC LO-DOSE ASPIRIN) 81 MG EC tablet Take 1 tablet (81 mg total) by mouth daily. Swallow whole.  [name] atorvastatin (LIPITOR) 80 MG tablet Take 1 tablet (80 mg total) by mouth daily.  . Blood Pressure Monitoring (BLOOD PRESSURE KIT) DEVI Use as directed to check blood pressure daily dx: I10  . citalopram (CELEXA) 10 MG tablet Take 1 tablet (10 mg total) by mouth daily. (Patient not taking: Reported on 07/21/2020)  . meclizine (ANTIVERT) 25 MG tablet Take 1 tablet (25 mg total) by mouth 3 (three) times daily as needed for  dizziness.  [name] MITIGARE 0.6 MG CAPS TAKE 1 CAPSULE BY MOUTH 2 TIMES DAILY  . omeprazole (PRILOSEC) 20 MG capsule Take 1 capsule (20 mg total) by mouth 2 (two) times daily before a meal.   No facility-administered encounter medications on file as of 07/19/2020.   Patient Care Plan: Wellness (Adult)    Problem Identified: Cognitive Function (Wellness)     Long-Range Goal: Cognitive Function Enhanced   Start Date: 07/21/2020  Expected End Date: 10/19/2020  This Visit's Progress: On track  Priority: High  Note:   Current Barriers:   Ineffective Self Health Maintenance  Unable to self administer medications as prescribed  Lacks social connections  Currently UNABLE TO independently self manage needs related to chronic health conditions.   Knowledge Deficits related to short term plan for care coordination needs and long term plans for chronic disease management needs Nurse Case Manager Clinical Goal(s):   Over the next 90 days, patient will work with care management team to address care coordination and chronic disease management needs related to Disease Management  Psychosocial Support  Caregiver Stress support   Interventions:   One on one collaboration to establish plan of care to address care coordination and disease management needs with clinical colleague [name] BSW.   Collaborate with PCP [name], FNP regarding Rx needed for weight scales for in home weight checks and BP cuff for self monitoring of BP  Assess changes in cognitive function using standardized assessment when available.  Encourage and refer for services that stimulate thinking, problem-solving and memory, such as cognitive training and cognitive rehabilitation.   Encourage physical activity or exercise based on ability and tolerance.   Patient Goals/Self Care Activities: - stay in touch with my family and friends - follow up with PCP for ongoing evaluation and treatment of depression and or  decline in cognitive ability/thinking   -Implement daily exercise routine as tolerated -Participate in cognitively stimulating activity such as card playing with caregiver/family  Follow Up Plan: Telephone follow up appointment with care management team member scheduled for: 07/29/20   Problem Identified: Healthy Nutrition (Wellness)     Goal: Healthy Nutrition Achieved   Start Date: 07/21/2020  Expected End Date: 10/19/2020  This Visit's Progress: On track  Priority: High  Note:   Current Barriers:   Ineffective Self Health Maintenance  Unable to self administer medications as prescribed  Lacks social connections  Currently UNABLE TO independently self manage needs related to chronic health conditions.   Knowledge Deficits related to short term plan for care coordination needs and long term plans for chronic disease management needs Nurse Case Manager Clinical Goal(s):   Over the next 90 days, patient will work with care management team to address care coordination and chronic disease management needs related to Disease Management  Other Nutritional Support to promote healthy weight gain     Interventions:   Recommend or set healthy weight goal based on body mass index.   Review current dietary intake and exercise levels.   Encourage small steps toward making change to eating and exercising.   Provide individualized medical nutrition therapy.   Collaborate with PCP to obtain Rx for weight scale in order to self monitor weights  Collaborate with embedded BSW to request coupon assistance for high protein nutritional supplements  Collaborate with PCP for consideration of adding a prescription appetite stimulate to patients pharmacological regimen   Patient Goals/Self Care Activities:  - set goal weight  - obtain weight scale for home use - monitor weights daily and record, weighing the same time each morning - follow up with the Registered Dietician to help monitor  Abnormal weight loss  - Continue drinking high protein oral supplements as prescribed   Follow Up Plan: Telephone follow up appointment with care management team member scheduled for: 07/29/20       Goal: Symptoms Monitored and Managed   Start Date: 07/21/2020  Expected End Date: 10/19/2020  This Visit's Progress: On track  Priority: High  Note:   Current Barriers:   Ineffective Self Health Maintenance  Unable to self administer medications as prescribed  Lacks social connections  Currently UNABLE TO independently self manage needs related to chronic health conditions.   Knowledge Deficits related to short term plan for care coordination needs and long term plans for chronic disease management needs Nurse Case Manager Clinical Goal(s):   Over the next 90 days, patient will work with care management team to address care coordination and chronic disease management needs related to Disease Management  Educational Needs  Psychosocial Support   Interventions:   Facilitate and advocate for mental health treatment that may include depression-focused psychotherapy, cognitive-behavioral therapy, social-skills training, relaxation training, problem-solving therapy.   Prepare for use of pharmacotherapy, such as selective serotonin-reuptake inhibitor, tricyclic antidepressant, serotonin norepinephrine-reuptake inhibitor, amino ketone, monoamine-oxidase inhibitor based on presentation and risk factors.   Promote use of complementary and alternative medicine therapy including exercise, relaxation, music, bright light or dance therapy, acupuncture, aromatherapy.  Monitor and promote nutrition, pain control and sleep/rest; provide guidance regarding sleep hygiene techniques when patient presents with insomnia.   Monitor and manage response to medication and therapy regularly; consider weekly for the first month, then monthly after 4 to 8 weeks of treatment until full remission or for 6 to 24  months; anticipate adjustments to plan.   Prepare patient for potential long-term pharmacologic treatment that may prevent a relapse.   Facilitate shared decision-making regarding treatment, particularly for major depression, that may include electroconvulsive therapy or transcranial magnetic stimulation.  Patient Goals/Self Care Activities:  - stay in touch with my family and friends - follow up with PCP for ongoing evaluation and treatment of depression    Follow Up Plan: Telephone follow up appointment with care management team member scheduled for: 07/29/20     Problem Identified: Resistant Hypertension (Hypertension)     Long-Range Goal: Response to Treatment Maximized   Start Date: 07/21/2020  Expected End Date: 10/19/2020  This Visit's Progress: On track  Priority: High  Note:   Current Barriers:  [name] Knowledge Deficits related to basic understanding of hypertension pathophysiology and self care management . Unable to self administer medications as prescribed . Lacks Engineer, maintenance (IT) Case Manager Clinical Goal(s):  [name] Over the next 90 days, patient will verbalize basic understanding of hypertension disease process and self health management plan as evidenced by patient's caregiver will obtain a BP cuff for home use and verbalize knowledge related to when to contact the PCP for abnormal BP readings. Interventions:  . Evaluation of current treatment plan related to hypertension self management and patient's adherence to plan as established by provider. . Reviewed medications with patient and discussed importance of compliance . Discussed plans with patient for ongoing care management follow up and provided patient with direct contact information for care management team . Advised patient, providing education and rationale, to monitor blood pressure daily and record, calling PCP for findings outside established parameters.  . Reviewed scheduled/upcoming provider appointments  including:  Patient Goals/Self-Care Activities:  Over the next 90 days, patient will:  - Obtain a BP cuff for home use   - Self administer medications as prescribed  - Attend all scheduled provider appointments  - Call provider office for new concerns, questions, or BP outside discussed parameters  - Check BP and record as discussed  - Follows a low sodium diet  Follow Up Plan: Telephone follow up appointment with care management team member scheduled for: 07/29/20     Follow Up Plan: Telephone follow up appointment with care management team member scheduled for: 07/29/20  Barb Merino, RN, BSN, CCM Care Management Coordinator [place] [facility]  Direct Phone: [phone]

## 2020-07-26 NOTE — Progress Notes (Signed)
Cardiology Office Note   Date:  07/28/2020   ID:  [name], Kimberly [birth year], MRN [id number]  PCP:  [name], FNP  Cardiologist:   Kimberly Breeding, MD   Chief Complaint  Patient presents with  . Shortness of Breath      History of Present Illness: Kimberly [name] is a 64 y.o. female who presents for follow up of CAD and CABG.  She had CVA with PCA subacute infarct in 2019.  Since I last saw her she has had progressive weight loss.  We saw her prior to colonoscopy.   She had SOB and I sent her for a perfusion study which demonstrated infarct but no ischemia.     She is not describing any chest pressure, neck or arm discomfort.  She does occasionally notice palpitations which presyncope or syncope.  She has no weight gain or edema.  She still smokes cigarettes and her friend who is with her says that she smokes more than she ever did.  She does get dyspnea with exertion.  Because of her previous stroke she gets around mostly in a wheelchair but she walks with a walker.  She is not describing any PND or orthopnea however.    Past Medical History:  Diagnosis Date  . Bicipital tenosynovitis   . Coronary artery disease 05/2007   [id number]   . Fall 02/13/2018  . Family history of ischemic heart disease   . Gout   . Hammer toe   . Hip, thigh, leg, and ankle, insect bite, nonvenomous, without mention of infection(916.4)   . Hyperlipidemia   . Hypertension   . Other abnormality of red blood cells   . Rotator cuff syndrome of left shoulder   . Stroke [facility])     Past Surgical History:  Procedure Laterality Date  . CARDIAC CATHETERIZATION    . COLONOSCOPY WITH PROPOFOL N/A 01/30/2020   Procedure: COLONOSCOPY WITH PROPOFOL;  Surgeon: Kimberly Ada, MD;  Location: [facility] ENDOSCOPY;  Service: Endoscopy;  Laterality: N/A;  . CORONARY ARTERY BYPASS GRAFT     triple  . CORONARY ARTERY BYPASS GRAFT  2008  . POLYPECTOMY  01/30/2020   Procedure: POLYPECTOMY;  Surgeon: Kimberly Ada, MD;  Location: [facility] ENDOSCOPY;  Service: Endoscopy;;  . TUBAL LIGATION       Current Outpatient Medications  Medication Sig Dispense Refill  . amLODipine (NORVASC) 5 MG tablet Take 1&1/2 tablets 7.5 mg daily 135 tablet 3  . apixaban (ELIQUIS) 5 MG TABS tablet Take 1 tablet (5 mg total) by mouth 2 (two) times daily. 180 tablet 3  . aspirin (QC LO-DOSE ASPIRIN) 81 MG EC tablet Take 1 tablet (81 mg total) by mouth daily. Swallow whole. 90 tablet 1  . atorvastatin (LIPITOR) 80 MG tablet Take 1 tablet (80 mg total) by mouth daily. 90 tablet 1  . Blood Pressure Monitoring (BLOOD PRESSURE KIT) DEVI Use as directed to check blood pressure daily dx: I10 1 each 1  . citalopram (CELEXA) 10 MG tablet Take 1 tablet (10 mg total) by mouth daily. 30 tablet 2  . meclizine (ANTIVERT) 25 MG tablet Take 1 tablet (25 mg total) by mouth 3 (three) times daily as needed for dizziness. 30 tablet 0  . megestrol (MEGACE) 20 MG tablet Take 1 tablet (20 mg total) by mouth daily. 30 tablet 3  . MITIGARE 0.6 MG CAPS TAKE 1 CAPSULE BY MOUTH 2 TIMES DAILY 60 capsule 1  . omeprazole (PRILOSEC) 20 MG capsule Take 1  capsule (20 mg total) by mouth 2 (two) times daily before a meal. 180 capsule 1   No current facility-administered medications for this visit.    Allergies:   Clonidine derivatives and Spironolactone    ROS:  Please see the history of present illness.   Otherwise, review of systems are positive for none.   All other systems are reviewed and negative.    PHYSICAL EXAM: VS:  BP 117/76   Pulse 75   Ht 5' 7"  (1.702 m)   Wt 114 lb 9.6 oz (52 kg)   SpO2 (!) 84%   BMI 17.95 kg/m  , BMI Body mass index is 17.95 kg/m.  GEN:  No distress.  NECK:  No jugular venous distention at 90 degrees, waveform within normal limits, carotid upstroke brisk and symmetric, no bruits, no thyromegaly LYMPHATICS:  No cervical adenopathy LUNGS:  Clear to auscultation bilaterally BACK:  No CVA tenderness CHEST:   Unremarkable HEART:  S1 and S2 within normal limits, no S3, no S4, no clicks, no rubs, no murmurs ABD:  Positive bowel sounds normal in frequency in pitch, no bruits, no rebound, no guarding, unable to assess midline mass or bruit with the patient seated. EXT:  2 plus pulses throughout, no edema, no cyanosis no clubbing SKIN:  No rashes no nodules NEURO:  Cranial nerves II through XII grossly intact, motor grossly intact throughout PSYCH:  Cognitively intact, oriented to person place and time   EKG:  EKG is  ordered today. Sinus rhythm with premature atrial contractions, left ventricular hypertrophy with repolarization changes, unchanged from previous.  Premature atrial contractions.   Recent Labs: 12/02/2019: ALT 30; TSH 1.550 04/27/2020: BUN 12; Creatinine, Ser 0.83; Hemoglobin 11.1; Platelets 191; Potassium 3.9; Sodium 142    Lipid Panel    Component Value Date/Time   CHOL 114 09/02/2019 1651   TRIG 58 09/02/2019 1651   HDL 47 09/02/2019 1651   CHOLHDL 2.4 09/02/2019 1651   CHOLHDL 3.6 02/14/2018 0314   VLDL 16 02/14/2018 0314   LDLCALC 54 09/02/2019 1651      Wt Readings from Last 3 Encounters:  07/28/20 114 lb 9.6 oz (52 kg)  04/25/20 118 lb (53.5 kg)  04/01/20 112 lb (50.8 kg)      Other studies Reviewed: Additional studies/ records that were reviewed today include: .Lexiscan Myoview.. Review of the above records demonstrates:  Please see elsewhere in the note.     ASSESSMENT AND PLAN:  CAD:     The patient had previous infarct documented on her perfusion study.  There were no high risk ischemic areas.  I am not suggesting further cardiovascular testing.  She needs continued risk reduction.  DYSPNEA:  This is her predominant complaint.  I suspect this is related to longstanding tobacco abuse.  OVERGROWN TOENAILS: I will make a referral to the podiatrist.  HTN: Blood pressure is at target.  She will continue the meds as listed.   CAROTID STENOSIS:   She has  bilateral carotid occlusion.  We are managing this medically with risk reduction.   PAF:  Ms. [name] Kimberly has a CHA2DS2 - VASc score of 4.  She tolerates anticoagulation.  I did look through the previous strips from her stress test and I think this might have been multifocal atrial tachycardia sinus tachycardia but she has had documented paroxysmal atrial fibrillation and will remain on anticoagulation.   HYPERLIPIDEMIA:    LDL was 54 in January.  No change in therapy.   TOBACCO ABUSE:  We had a long discussion about this and strategies to quit smoking but she says she has been smoking for so long she will be able to.  COVID EDUCATION: She has been vaccinated.  We did talk about the booster.  Current medicines are reviewed at length with the patient today.  The patient does not have concerns regarding medicines.  The following changes have been made:  no change  Labs/ tests ordered today include:   Orders Placed This Encounter  Procedures  . Ambulatory referral to Podiatry  . EKG 12-Lead     Disposition:   FU with me in 12 months or sooner if needed   Signed, Kimberly Breeding, MD  07/28/2020 11:36 AM    [facility]  1

## 2020-07-28 ENCOUNTER — Ambulatory Visit (INDEPENDENT_AMBULATORY_CARE_PROVIDER_SITE_OTHER): Payer: Medicaid Other | Admitting: Cardiology

## 2020-07-28 ENCOUNTER — Other Ambulatory Visit: Payer: Self-pay

## 2020-07-28 ENCOUNTER — Encounter: Payer: Self-pay | Admitting: Cardiology

## 2020-07-28 VITALS — BP 117/76 | HR 75 | Ht 67.0 in | Wt 114.6 lb

## 2020-07-28 DIAGNOSIS — I2510 Atherosclerotic heart disease of native coronary artery without angina pectoris: Secondary | ICD-10-CM

## 2020-07-28 DIAGNOSIS — R7303 Prediabetes: Secondary | ICD-10-CM

## 2020-07-28 DIAGNOSIS — Z720 Tobacco use: Secondary | ICD-10-CM

## 2020-07-28 DIAGNOSIS — E785 Hyperlipidemia, unspecified: Secondary | ICD-10-CM

## 2020-07-28 DIAGNOSIS — I10 Essential (primary) hypertension: Secondary | ICD-10-CM | POA: Diagnosis not present

## 2020-07-28 DIAGNOSIS — I6350 Cerebral infarction due to unspecified occlusion or stenosis of unspecified cerebral artery: Secondary | ICD-10-CM

## 2020-07-28 DIAGNOSIS — I480 Paroxysmal atrial fibrillation: Secondary | ICD-10-CM | POA: Diagnosis not present

## 2020-07-28 DIAGNOSIS — Z008 Encounter for other general examination: Secondary | ICD-10-CM

## 2020-07-28 NOTE — Patient Instructions (Signed)
Medication Instructions:  NO CHANGES *If you need a refill on your cardiac medications before your next appointment, please call your pharmacy*  Lab Work: NONE ORDERED THIS VISIT  Testing/Procedures: NONE ORDERED THIS VISIT  Follow-Up: At [facility], you and your health needs are our priority.  As part of our continuing mission to provide you with exceptional heart care, we have created designated Provider Care Teams.  These Care Teams include your primary Cardiologist (physician) and Advanced Practice Providers (APPs -  Physician Assistants and Nurse Practitioners) who all work together to provide you with the care you need, when you need it.   Your next appointment:   12 month(s)   You will receive a reminder letter in the mail two months in advance. If you don't receive a letter, please call our office to schedule the follow-up appointment.  The format for your next appointment:   In Person  Provider:   James Hochrein, MD  

## 2020-07-29 ENCOUNTER — Telehealth: Payer: Self-pay

## 2020-07-29 ENCOUNTER — Telehealth: Payer: Medicaid Other

## 2020-07-29 NOTE — Telephone Encounter (Cosign Needed)
  Chronic Care Management   Outreach Note  07/29/2020 Name: Kimberly [name] MRN: [id number] DOB: 1956-04-22  Referred by: [name], FNP Reason for referral : Care Coordination (CC RN CM FU Attempt)   Kimberly [name] is enrolled in a Managed Medicaid Health Plan: No  An unsuccessful telephone outreach was attempted today. The patient was referred to the case management team for assistance with care management and care coordination.   Follow Up Plan: Telephone follow up appointment with care management team member scheduled for: 09/03/20  Barb Merino, RN, BSN, CCM Care Management Coordinator [place] [facility]  Direct Phone: [phone]

## 2020-08-12 ENCOUNTER — Ambulatory Visit: Payer: Medicaid Other

## 2020-08-12 DIAGNOSIS — F32A Depression, unspecified: Secondary | ICD-10-CM

## 2020-08-12 DIAGNOSIS — I4891 Unspecified atrial fibrillation: Secondary | ICD-10-CM

## 2020-08-12 DIAGNOSIS — I10 Essential (primary) hypertension: Secondary | ICD-10-CM

## 2020-08-12 DIAGNOSIS — I480 Paroxysmal atrial fibrillation: Secondary | ICD-10-CM

## 2020-08-12 DIAGNOSIS — K219 Gastro-esophageal reflux disease without esophagitis: Secondary | ICD-10-CM

## 2020-08-12 NOTE — Chronic Care Management (AMB) (Signed)
Chronic Care Management Pharmacy  Name: Kimberly [name]  MRN: [id number] DOB: 21-Feb-1956   Chief Complaint/ HPI  Kimberly [name],  64 y.o. , female presents for their Initial CCM visit with the clinical pharmacist via telephone due to COVID-19 Pandemic. She was in a nursing home and came home 2 years ago. She had multiple strokes and was not able to walk, hold anything or even sit up. Her sister Kimberly [name] reports that she was in the nursing home from July to October and she was able to start moving around and holding things and then she went home. 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 Her daughter would go on the weekend and help Kimberly [name] in the nursing home, and then her sister and son would go to the nursing home. Prior to her coming home her son had 3 or 4 strokes, and then her daughter got sick and so Kimberly [name] was the only one who was able to help her sister this occurred in 2019. Kimberly [name] has an aide that comes for 2 hours a day to make sure she takes her medication and eats her breakfast and then she sits with her sister in the afternoon. Then at night she is by herself and she tries to check on her. Her daughter comes up on the weekends sometimes and her son helps out as well, but he is unable to bathe her. Kimberly [name] had a younger sister who is 73 who passed of BR cancer, and she is taken care of by the middle sister. Her other siblings live out of town and some are disabled. Kimberly [name] reports that Kimberly [name] is scared of moving around because she is afraid of calling. She was going to have a nutritionist but that didn't work out. She reports that her sister has been very depressed. Her medications are taken care of by the aide that comes everyday, and when the aide is not available she has a separate pill box of medications. At night time the pill box. Patients sister reports that she is talking to me during her lunch break today.   PCP : [name], FNP  Their chronic  conditions include: Hypertension, Prediabetes, Paroxysmal  Atrial Fibrillation, Hyperlipidemia, Coronary Artery Disease, Depression  Office Visits:   Consult Visit:  03/18/2020 Cariodiologist OV: Patient to have lexiscan myoview, Amlodipine 7.5 MG daily,   ED Visit:  01/30/2020 -  Medications: Outpatient Encounter Medications as of 08/12/2020  Medication Sig  . amLODipine (NORVASC) 5 MG tablet Take 1&1/2 tablets 7.5 mg daily  . apixaban (ELIQUIS) 5 MG TABS tablet Take 1 tablet (5 mg total) by mouth 2 (two) times daily.  [name] aspirin (QC LO-DOSE ASPIRIN) 81 MG EC tablet Take 1 tablet (81 mg total) by mouth daily. Swallow whole.  [name] atorvastatin (LIPITOR) 80 MG tablet Take 1 tablet (80 mg total) by mouth daily.  . Blood Pressure Monitoring (BLOOD PRESSURE KIT) DEVI Use as directed to check blood pressure daily dx: I10  . citalopram (CELEXA) 10 MG tablet Take 1 tablet (10 mg total) by mouth daily.  . meclizine (ANTIVERT) 25 MG tablet Take 1 tablet (25 mg total) by mouth 3 (three) times daily as needed for dizziness.  . megestrol (MEGACE) 20 MG tablet Take 1 tablet (20 mg total) by mouth daily.  [name] MITIGARE 0.6 MG CAPS TAKE 1 CAPSULE BY MOUTH 2 TIMES DAILY  . omeprazole (PRILOSEC) 20 MG capsule Take 1 capsule (20 mg total)  by mouth 2 (two) times daily before a meal.   No facility-administered encounter medications on file as of 08/12/2020.     Current Diagnosis/Assessment:  Goals Addressed            This Visit's Progress   . Pharmacy Care Plan        CARE PLAN ENTRY (see longitudinal plan of care for additional care plan information)  Current Barriers:  . Chronic Disease Management support, education, and care coordination needs related to Hypertension, Hyperlipidemia, Atrial Fibrillation, GERD, and Depression   Hypertension BP Readings from Last 3 Encounters:  07/28/20 117/76  05/17/20 122/68  04/27/20 (!) 120/91   . Pharmacist Clinical Goal(s): o Over the next 90 days,  patient will work with PharmD and providers to maintain BP goal <130/80 . Current regimen:  o Amlodipine 5 MG - take daily . Interventions: o Dietary and exercise recommendations provided - Limit salt intake . Patient self care activities - Over the next 90 days, patient will: o Check BP  once per week and document, and provide at future appointments o Ensure daily salt intake < 2300 mg/day  Hyperlipidemia Lab Results  Component Value Date/Time   LDLCALC 54 09/02/2019 04:51 PM   . Pharmacist Clinical Goal(s): o Over the next 90 days, patient will work with PharmD and providers to maintain LDL goal < 70 . Current regimen:  o Atorvastatin 80 MG- take daily . Interventions: o Dietary and exercise recommendations provided - Increase intake of healthy fats (i.e avocados, walnuts, flaxseed) - Decrease intake of foods high in saturated and trans fat (i.e. chips) . Patient self care activities - Over the next 90 days, patient will: o Work to improve diet, increase heart healthy fats and decrease trans/saturated fats  ATRIAL FIBRILLATION . Pharmacist Clinical Goal(s) o Over the next 90 days, patient will work with PharmD and providers to stay rate regulated  . Current regimen:  o ELIQUIS 5 MG- Take twice daily . Interventions: o Discussed signs and symptoms of bleeding . Patient self care activities - Over the next 90 days, patient will: o Continue to take medication twice per day  Depression . Pharmacist Clinical Goal(s) o Over the next 90 days, patient will work with PharmD and providers to improve sadness and crying . Current regimen:  o Citalopram 10 MG- take daily . Interventions: o Follow up with PCP team and determine if patients dose can be adjusted to Citalopram 20 MG daily  . Patient self care activities - Over the next 90 days, patient will: o Continue to take medication everyday.  Medication management . Pharmacist Clinical Goal(s): o Over the next 90 days, patient  will work with PharmD and providers to maintain optimal medication adherence . Current pharmacy: [facility] . Interventions o Comprehensive medication review performed. o Continue current medication management strategy . Patient self care activities - Over the next 90 days, patient will: o Focus on medication adherence by using pill packets provided by pharmacy o Take medications as prescribed o Report any questions or concerns to PharmD and/or provider(s)  Initial goal documentation        Hypertension   BP goal is:  <130/80  Office blood pressures are  BP Readings from Last 3 Encounters:  07/28/20 117/76  05/17/20 122/68  04/27/20 (!) 120/91   Patient checks BP at home infrequently Patient home BP readings are ranging:   Patient has failed these meds in the past:  Patient is currently controlled on the following medications:  .  Amlodipine 5 MG- Take 7.5 MG daily  We discussed :  Patients BP was elevated and so started on medication by cardiologist   Plan  Continue current medications   Hyperlipidemia   LDL goal < 70  Last lipids Lab Results  Component Value Date   CHOL 114 09/02/2019   HDL 47 09/02/2019   LDLCALC 54 09/02/2019   TRIG 58 09/02/2019   CHOLHDL 2.4 09/02/2019   Hepatic Function Latest Ref Rng & Units 12/02/2019 09/02/2019 11/28/2018  Total Protein 6.0 - 8.5 g/dL 6.7 7.0 5.9(L)  Albumin 3.8 - 4.8 g/dL 4.6 4.6 3.3(L)  AST 0 - 40 IU/L _0 ALT 0 - 32 IU/L _1 Alk Phosphatase 39 - 117 IU/L 131(H) 130(H) 81  Total Bilirubin 0.0 - 1.2 mg/dL 0.4 0.3 0.6  Bilirubin, Direct 0.0 - 0.3 mg/dL - - -     The ASCVD Risk score (Lupton., et al., 2013) failed to calculate for the following reasons:   The patient has a prior MI or stroke diagnosis   Patient has failed these meds in past:  Patient is currently controlled on the following medications:  . Atorvastatin 80 MG- Take daily   We discussed:  diet and exercise  extensively  Plan  Continue current medications    AFIB   Patient is currently rate controlled. Office heart rates are  Pulse Readings from Last 3 Encounters:  07/28/20 75  05/17/20 76  04/27/20 66    CHA2DS2-VASc Score = 4  The patient's score is based upon: CHF History: No HTN History: Yes Diabetes History: No Stroke History: Yes Vascular Disease History: No Age Score: 0 Gender Score: 1      ASSESSMENT AND PLAN: Paroxysmal Atrial Fibrillation (ICD10:  I48.0) The patient's CHA2DS2-VASc score is 4, indicating a 4.8% annual risk of stroke.      :[id number]} The patient is at significant risk for stroke/thromboembolism based upon her CHA2DS2-VASc Score of 4.  Continue Apixaban (Eliquis).    Signed,  Mayford Knife, [facility]    08/12/2020 12:29 PM    BP goal is:  <130/80  Office blood pressures are  BP Readings from Last 3 Encounters:  07/28/20 117/76  05/17/20 122/68  04/27/20 (!) 120/91   Patient checks BP at home infrequently Patient home BP readings are ranging: None reported at this time   Patient has failed these meds in past:  Patient is currently controlled on the following medications:  . Apixiban 5 MG - Take twice daily  We discussed:  diet and exercise extensively  Plan  Continue current medications   Depression   Depression screen [facility] 2/9 03/30/2020 12/02/2019 12/02/2019  Decreased Interest 1 0 0  Down, Depressed, Hopeless 1 1 0  PHQ - 2 Score 2 1 0  Altered sleeping 3 0 -  Tired, decreased energy 3 1 -  Change in appetite 3 1 -  Feeling bad or failure about yourself  1 1 -  Trouble concentrating 1 1 -  Moving slowly or fidgety/restless 3 1 -  Suicidal thoughts 0 0 -  PHQ-9 Score 16 6 -  Difficult doing work/chores Somewhat difficult Somewhat difficult -  Some recent data might be hidden    Patient has failed these meds in past:  Patient is currently uncontrolled on the following medications:  . Citalopram 10 MG- Take daily   We  discussed:   Sister reports that she is very depressed  Sometimes crying when she  comes by her house  Took her out to see the Christmas Trees  Some days are better than others   Plan  Continue current medications Plan to collaborate with PCP team to possibly increase patients Citalopram to 20 mg tablet daily.   GERD   Patient has failed these meds in past:  Patient is currently controlled on the following medications:  . Omeprazole 20 MG Capsule-take daily  Will discuss further during next visit.   Plan  Continue current medications  GOUT   Patient has failed these meds in past:  Patient is currently controlled on the following medications:  [name] Mitigare 0.6 MG Capsule - Take daily   We will discuss during next office visit.  Plan  Continue current medications  Coronary Artery Disease   Patient has failed these meds in past:  Patient is currently controlled on the following medications:  . Aspirin 81 MG - Take daily . Amlodipine 5 MG- Take 1.5 tablet daily . Atorvastatin 80 MG- Take daily . Apixiban 5 MG - Take twice daily  Will discuss in more detail at next office visit.  Plan  Continue current medications    Vaccines   Reviewed and discussed patient's vaccination history.    Immunization History  Administered Date(s) Administered  . Influenza Split 06/24/2016  . Influenza,inj,Quad PF,6+ Mos 07/29/2013, 06/17/2017, 05/17/2020  . Moderna Sars-Covid-2 Vaccination 01/23/2020, 02/20/2020   We Discussed: -Patient scheduled for booster Pneumonia Vaccine   Plan  Recommended patient receive COVID-19 booster vaccine in [place] office in January .   Medication Management   Patient's preferred pharmacy is:  [facility] [address] [place] [address] Phone: [phone] Fax: [place] [address] [place] [address] Phone: [phone] Fax:  [phone]  [facility] - [place], [place] - [address] [place] [address] Phone: [phone] Fax: [phone]  [facility] - [place], [place] - [address] [place] [address] Phone: [phone] Fax: [phone]  Uses pill box? Yes Pt endorses 100% compliance  We discussed: Discussed benefits of medication synchronization, packaging and delivery as well as enhanced pharmacist oversight with [facility]. At this time patients caregiver prefers to stay with [facility].   Plan  Continue current medication management strategy    Follow up: 1 month phone visit   [name], PharmD Clinical Pharmacist [facility] [phone]

## 2020-08-13 NOTE — Patient Instructions (Addendum)
Visit Information  Goals Addressed            This Visit's Progress   . Pharmacy Care Plan         Ms. Osentoski was given information about Chronic Care Management services today including:  1. CCM service includes personalized support from designated clinical staff supervised by her physician, including individualized plan of care and coordination with other care providers 2. 24/7 contact phone numbers for assistance for urgent and routine care needs. 3. Standard insurance, coinsurance, copays and deductibles apply for chronic care management only during months in which we provide at least 20 minutes of these services. Most insurances cover these services at 100%, however patients may be responsible for any copay, coinsurance and/or deductible if applicable. This service may help you avoid the need for more expensive face-to-face services. 4. Only one practitioner may furnish and bill the service in a calendar month. 5. The patient may stop CCM services at any time (effective at the end of the month) by phone call to the office staff.  Patient agreed to services and verbal consent obtained.   The patient verbalized understanding of instructions, educational materials, and care plan provided today and agreed to receive a mailed copy of patient instructions, educational materials, and care plan.  The pharmacy team will reach out to the patient again over the next 30 days.   Harlan Stains, [facility]

## 2020-08-18 ENCOUNTER — Encounter (HOSPITAL_COMMUNITY): Payer: Self-pay

## 2020-08-18 ENCOUNTER — Inpatient Hospital Stay (HOSPITAL_COMMUNITY)
Admission: EM | Admit: 2020-08-18 | Discharge: 2020-08-22 | DRG: 309 | Disposition: A | Discharge status: Home / Self Care | Payer: Medicaid Other | Attending: Internal Medicine | Admitting: Internal Medicine

## 2020-08-18 ENCOUNTER — Emergency Department (HOSPITAL_COMMUNITY): Payer: Medicaid Other

## 2020-08-18 ENCOUNTER — Other Ambulatory Visit: Payer: Self-pay

## 2020-08-18 ENCOUNTER — Other Ambulatory Visit: Payer: Self-pay | Admitting: Nurse Practitioner

## 2020-08-18 ENCOUNTER — Telehealth: Payer: Self-pay

## 2020-08-18 DIAGNOSIS — F419 Anxiety disorder, unspecified: Secondary | ICD-10-CM | POA: Diagnosis present

## 2020-08-18 DIAGNOSIS — I700 Atherosclerosis of aorta: Secondary | ICD-10-CM | POA: Diagnosis present

## 2020-08-18 DIAGNOSIS — I69354 Hemiplegia and hemiparesis following cerebral infarction affecting left non-dominant side: Secondary | ICD-10-CM

## 2020-08-18 DIAGNOSIS — K589 Irritable bowel syndrome without diarrhea: Secondary | ICD-10-CM | POA: Diagnosis present

## 2020-08-18 DIAGNOSIS — Z79899 Other long term (current) drug therapy: Secondary | ICD-10-CM

## 2020-08-18 DIAGNOSIS — Z20822 Contact with and (suspected) exposure to covid-19: Secondary | ICD-10-CM | POA: Diagnosis present

## 2020-08-18 DIAGNOSIS — I25810 Atherosclerosis of coronary artery bypass graft(s) without angina pectoris: Secondary | ICD-10-CM | POA: Diagnosis present

## 2020-08-18 DIAGNOSIS — Z888 Allergy status to other drugs, medicaments and biological substances status: Secondary | ICD-10-CM

## 2020-08-18 DIAGNOSIS — E785 Hyperlipidemia, unspecified: Secondary | ICD-10-CM | POA: Diagnosis present

## 2020-08-18 DIAGNOSIS — M109 Gout, unspecified: Secondary | ICD-10-CM | POA: Diagnosis present

## 2020-08-18 DIAGNOSIS — R531 Weakness: Secondary | ICD-10-CM

## 2020-08-18 DIAGNOSIS — R5381 Other malaise: Secondary | ICD-10-CM | POA: Diagnosis present

## 2020-08-18 DIAGNOSIS — I480 Paroxysmal atrial fibrillation: Principal | ICD-10-CM | POA: Diagnosis present

## 2020-08-18 DIAGNOSIS — K5900 Constipation, unspecified: Secondary | ICD-10-CM | POA: Diagnosis present

## 2020-08-18 DIAGNOSIS — Z8249 Family history of ischemic heart disease and other diseases of the circulatory system: Secondary | ICD-10-CM

## 2020-08-18 DIAGNOSIS — Z951 Presence of aortocoronary bypass graft: Secondary | ICD-10-CM

## 2020-08-18 DIAGNOSIS — Z7901 Long term (current) use of anticoagulants: Secondary | ICD-10-CM

## 2020-08-18 DIAGNOSIS — I2510 Atherosclerotic heart disease of native coronary artery without angina pectoris: Secondary | ICD-10-CM | POA: Diagnosis present

## 2020-08-18 DIAGNOSIS — D509 Iron deficiency anemia, unspecified: Secondary | ICD-10-CM | POA: Diagnosis present

## 2020-08-18 DIAGNOSIS — I25708 Atherosclerosis of coronary artery bypass graft(s), unspecified, with other forms of angina pectoris: Secondary | ICD-10-CM | POA: Diagnosis present

## 2020-08-18 DIAGNOSIS — K219 Gastro-esophageal reflux disease without esophagitis: Secondary | ICD-10-CM | POA: Diagnosis present

## 2020-08-18 DIAGNOSIS — Z716 Tobacco abuse counseling: Secondary | ICD-10-CM

## 2020-08-18 DIAGNOSIS — I10 Essential (primary) hypertension: Secondary | ICD-10-CM | POA: Diagnosis present

## 2020-08-18 DIAGNOSIS — R1084 Generalized abdominal pain: Secondary | ICD-10-CM | POA: Diagnosis present

## 2020-08-18 DIAGNOSIS — Z7982 Long term (current) use of aspirin: Secondary | ICD-10-CM

## 2020-08-18 DIAGNOSIS — I4891 Unspecified atrial fibrillation: Secondary | ICD-10-CM | POA: Diagnosis present

## 2020-08-18 DIAGNOSIS — I712 Thoracic aortic aneurysm, without rupture: Secondary | ICD-10-CM | POA: Diagnosis present

## 2020-08-18 DIAGNOSIS — F17210 Nicotine dependence, cigarettes, uncomplicated: Secondary | ICD-10-CM | POA: Diagnosis present

## 2020-08-18 DIAGNOSIS — Z5329 Procedure and treatment not carried out because of patient's decision for other reasons: Secondary | ICD-10-CM | POA: Diagnosis not present

## 2020-08-18 DIAGNOSIS — F32A Depression, unspecified: Secondary | ICD-10-CM | POA: Diagnosis present

## 2020-08-18 DIAGNOSIS — R109 Unspecified abdominal pain: Secondary | ICD-10-CM

## 2020-08-18 DIAGNOSIS — I248 Other forms of acute ischemic heart disease: Secondary | ICD-10-CM | POA: Diagnosis present

## 2020-08-18 LAB — COMPREHENSIVE METABOLIC PANEL
ALT: 30 U/L (ref 0–44)
AST: 27 U/L (ref 15–41)
Albumin: 4.4 g/dL (ref 3.5–5.0)
Alkaline Phosphatase: 124 U/L (ref 38–126)
Anion gap: 10 (ref 5–15)
BUN: 10 mg/dL (ref 8–23)
CO2: 25 mmol/L (ref 22–32)
Calcium: 9.9 mg/dL (ref 8.9–10.3)
Chloride: 107 mmol/L (ref 98–111)
Creatinine, Ser: 0.86 mg/dL (ref 0.44–1.00)
GFR, Estimated: >60 mL/min (ref >60)
Glucose, Bld: 77 mg/dL (ref 70–99)
Potassium: 3.5 mmol/L (ref 3.5–5.1)
Sodium: 142 mmol/L (ref 135–145)
Total Bilirubin: 0.7 mg/dL (ref 0.3–1.2)
Total Protein: 7.8 g/dL (ref 6.5–8.1)

## 2020-08-18 LAB — TROPONIN I (HIGH SENSITIVITY): Troponin I (High Sensitivity): 8 ng/L (ref <18)

## 2020-08-18 LAB — CBC
HCT: 39 % (ref 36.0–46.0)
Hemoglobin: 11.7 g/dL — ABNORMAL LOW (ref 12.0–15.0)
MCH: 21.6 pg — ABNORMAL LOW (ref 26.0–34.0)
MCHC: 30 g/dL (ref 30.0–36.0)
MCV: 72.1 fL — ABNORMAL LOW (ref 80.0–100.0)
Platelets: 229 10*3/uL (ref 150–400)
RBC: 5.41 MIL/uL — ABNORMAL HIGH (ref 3.87–5.11)
RDW: 17.7 % — ABNORMAL HIGH (ref 11.5–15.5)
WBC: 7.2 10*3/uL (ref 4.0–10.5)
nRBC: 0 % (ref 0.0–0.2)

## 2020-08-18 LAB — LIPASE, BLOOD: Lipase: 18 U/L (ref 11–51)

## 2020-08-18 MED ORDER — SODIUM CHLORIDE 0.9 % IV BOLUS
1000.0000 mL | Freq: Once | INTRAVENOUS | Status: AC
  Administered 2020-08-18: 1000 mL via INTRAVENOUS

## 2020-08-18 MED ORDER — FENTANYL CITRATE (PF) 100 MCG/2ML IJ SOLN
25.0000 ug | Freq: Once | INTRAMUSCULAR | Status: AC
  Administered 2020-08-18: 25 ug via INTRAVENOUS
  Filled 2020-08-18: qty 2

## 2020-08-18 MED ORDER — ONDANSETRON HCL 4 MG/2ML IJ SOLN
4.0000 mg | Freq: Once | INTRAMUSCULAR | Status: AC
  Administered 2020-08-18: 4 mg via INTRAVENOUS
  Filled 2020-08-18: qty 2

## 2020-08-18 NOTE — ED Notes (Signed)
Please have patient call her sister, Thereasa Solo, at [phone].

## 2020-08-18 NOTE — ED Notes (Signed)
Pt reports R-sided abd pain onset this morning 0400. Pt reports increased urination and thirst. Pt reports vomiting. Pt also reports wanting to get STD tested. Also reports LBM 2 days ago.

## 2020-08-18 NOTE — ED Provider Notes (Signed)
[facility] Emergency Department Provider Note MRN:  [id number]  Arrival date & time: 08/19/20     Chief Complaint   Abdominal Pain, Chest Pain, and Shortness of Breath   History of Present Illness   Kimberly [name] is a 65 y.o. year-old female with a history of hypertension, stroke, A. fib presenting to the ED with chief complaint of abdominal pain.  Sudden onset diffuse abdominal pain beginning this morning at 4 AM.  Rushed to the bathroom to urinate.  Persistent pain throughout the day associated with nausea.  Single episode of emesis this morning.  No bowel movements for the past 2 days.  Also had some mild chest pressure and shortness of breath when EMS came earlier today.  Feeling subjective fevers today, denies cough, no neck or back pain, no headache or vision change, no other complaints.  Symptoms constant, moderate to severe, no exacerbating or alleviating factors.  Review of Systems  A complete 10 system review of systems was obtained and all systems are negative except as noted in the HPI and PMH.   Patient's Health History    Past Medical History:  Diagnosis Date  . Bicipital tenosynovitis   . Coronary artery disease 05/2007   [id number]   . Fall 02/13/2018  . Family history of ischemic heart disease   . Gout   . Hammer toe   . Hip, thigh, leg, and ankle, insect bite, nonvenomous, without mention of infection(916.4)   . Hyperlipidemia   . Hypertension   . Other abnormality of red blood cells   . Rotator cuff syndrome of left shoulder   . Stroke [facility])     Past Surgical History:  Procedure Laterality Date  . CARDIAC CATHETERIZATION    . COLONOSCOPY WITH PROPOFOL N/A 01/30/2020   Procedure: COLONOSCOPY WITH PROPOFOL;  Surgeon: Carol Ada, MD;  Location: [facility] ENDOSCOPY;  Service: Endoscopy;  Laterality: N/A;  . CORONARY ARTERY BYPASS GRAFT     triple  . CORONARY ARTERY BYPASS GRAFT  2008  . POLYPECTOMY  01/30/2020   Procedure: POLYPECTOMY;   Surgeon: Carol Ada, MD;  Location: [facility] ENDOSCOPY;  Service: Endoscopy;;  . TUBAL LIGATION      Family History  Problem Relation Age of Onset  . Hypertension Mother   . Heart attack Father   . Diabetes Father   . Kidney failure Father   . Anxiety disorder Sister   . Sarcoidosis Sister     Social History   Socioeconomic History  . Marital status: Single    Spouse name: Not on file  . Number of children: 2  . Years of education: Not on file  . Highest education level: 11th grade  Occupational History  . Occupation: retired  Tobacco Use  . Smoking status: Former Smoker    Packs/day: 2.00    Years: 35.00    Pack years: 70.00    Types: Cigarettes    Quit date: 08/12/2019    Years since quitting: 1.0  . Smokeless tobacco: Former Systems developer    Quit date: 08/09/2008  . Tobacco comment: had assistance with the quit smoking - had patches and lozenges  Vaping Use  . Vaping Use: Never used  Substance and Sexual Activity  . Alcohol use: Yes    Comment: ocassionally wine  . Drug use: Yes    Types: Marijuana    Comment: history of cocaine use  . Sexual activity: Not on file  Other Topics Concern  . Not on file  Social History  Narrative   Patient is right-handed. She lives alone in a one level home, 2 steps to enter. Her family is very active in her care.    Social Determinants of Health   Financial Resource Strain: Not on file  Food Insecurity: Not on file  Transportation Needs: Not on file  Physical Activity: Not on file  Stress: Not on file  Social Connections: Not on file  Intimate Partner Violence: Not on file     Physical Exam   Vitals:   08/19/20 0536 08/19/20 0647  BP: 103/90 103/79  Pulse: (!) 106 84  Resp: 16 18  Temp:    SpO2: 95% 97%    CONSTITUTIONAL: Chronically ill-appearing, NAD NEURO:  Alert and oriented x 3, no focal deficits EYES:  eyes equal and reactive ENT/NECK:  no LAD, no JVD CARDIO: Tachycardic rate, well-perfused, normal S1 and S2 PULM:   CTAB no wheezing or rhonchi GI/GU:  normal bowel sounds, non-distended, moderate diffuse abdominal tenderness MSK/SPINE:  No gross deformities, no edema SKIN:  no rash, atraumatic PSYCH:  Appropriate speech and behavior  *Additional and/or pertinent findings included in MDM below  Diagnostic and Interventional Summary    EKG Interpretation  Date/Time:  Wednesday August 18 2020 10:17:13 EST Ventricular Rate:  70 PR Interval:  122 QRS Duration: 92 QT Interval:  450 QTC Calculation: 486 R Axis:   57 Text Interpretation: Sinus rhythm with Premature atrial complexes Minimal voltage criteria for LVH, may be normal variant ( Sokolow-Lyon ) Marked ST abnormality, possible anterior subendocardial injury Abnormal ECG No significant change since last tracing EARLIER SAME DATE or from Sept -14-2021 Confirmed by Susy Frizzle [phone]) on 08/18/2020 10:24:48 AM      Labs Reviewed  CBC - Abnormal; Notable for the following components:      Result Value   RBC 5.41 (*)    Hemoglobin 11.7 (*)    MCV 72.1 (*)    MCH 21.6 (*)    RDW 17.7 (*)    All other components within normal limits  RESP PANEL BY RT-PCR (FLU A&B, COVID) ARPGX2  LIPASE, BLOOD  COMPREHENSIVE METABOLIC PANEL  URINALYSIS, ROUTINE W REFLEX MICROSCOPIC  HIV ANTIBODY (ROUTINE TESTING W REFLEX)  COMPREHENSIVE METABOLIC PANEL  CBC WITH DIFFERENTIAL/PLATELET  TSH  TROPONIN I (HIGH SENSITIVITY)  TROPONIN I (HIGH SENSITIVITY)  TROPONIN I (HIGH SENSITIVITY)    CT ABDOMEN PELVIS W CONTRAST  Final Result    DG Chest 2 View  Final Result      Medications  diltiazem (CARDIZEM) 125 mg in dextrose 5% 125 mL (1 mg/mL) infusion (7.5 mg/hr Intravenous Rate/Dose Change 08/19/20 0419)  atorvastatin (LIPITOR) tablet 80 mg (has no administration in time range)  citalopram (CELEXA) tablet 10 mg (has no administration in time range)  pantoprazole (PROTONIX) EC tablet 40 mg (has no administration in time range)  apixaban (ELIQUIS) tablet 5  mg (has no administration in time range)  acetaminophen (TYLENOL) tablet 650 mg (has no administration in time range)    Or  acetaminophen (TYLENOL) suppository 650 mg (has no administration in time range)  sodium chloride 0.9 % bolus 1,000 mL (0 mLs Intravenous Stopped 08/19/20 0249)  fentaNYL (SUBLIMAZE) injection 25 mcg (25 mcg Intravenous Given 08/18/20 2334)  ondansetron (ZOFRAN) injection 4 mg (4 mg Intravenous Given 08/18/20 2333)  iohexol (OMNIPAQUE) 300 MG/ML solution 100 mL (100 mLs Intravenous Contrast Given 08/19/20 0045)  diltiazem (CARDIZEM) tablet 60 mg (60 mg Oral Given 08/19/20 0211)     Procedures  /  Critical Care .Critical Care Performed by: Maudie Flakes, MD Authorized by: Maudie Flakes, MD   Critical care provider statement:    Critical care time (minutes):  45   Critical care was necessary to treat or prevent imminent or life-threatening deterioration of the following conditions: A. fib with RVR.   Critical care was time spent personally by me on the following activities:  Discussions with consultants, evaluation of patient's response to treatment, examination of patient, ordering and performing treatments and interventions, ordering and review of laboratory studies, ordering and review of radiographic studies, pulse oximetry, re-evaluation of patient's condition, obtaining history from patient or surrogate and review of old charts    ED Course and Medical Decision Making  I have reviewed the triage vital signs, the nursing notes, and pertinent available records from the EMR.  Listed above are laboratory and imaging tests that I personally ordered, reviewed, and interpreted and then considered in my medical decision making (see below for details).  Tender abdomen, acute onset.  No chest pain at this time, very mild chest pain earlier today.  EKG showing some nonspecific changes.  In A. fib with rates in the 120s, will provide fluids, pain and nausea medicine, obtain CT to  evaluate for appendicitis, diverticulitis, perforated viscus, etc.     CT is overall reassuring and patient is feeling better.  Persistent heart rates in the 130s to 140s, A. fib with RVR.  Per chart review she is normally in sinus rhythm.  I explained to patient the management options, including cardioversion versus rate control.  Patient is uncomfortable with the concept of cardioversion at this time.  We attempted oral diltiazem rate control but it was ineffective.  Admitted to hospital service for rate control and further care.  Barth Kirks. Sedonia Small, [place] [email or web address]  Final Clinical Impressions(s) / ED Diagnoses     ICD-10-CM   1. Atrial fibrillation with RVR ([place])  I48.91     ED Discharge Orders    None       Discharge Instructions Discussed with and Provided to Patient:   Discharge Instructions   None       Maudie Flakes, MD 08/19/20 [phone]

## 2020-08-18 NOTE — Telephone Encounter (Signed)
Sister called and wanted to bring her sister in the office for appointment from the ER because 911 was called and the paramedic thought she was having a heartache they took her to the ER but there is a 11 hr wait. Explained to the patient that our office is not equipped to handle a possible heartache and that they are at the best place patient sister understood

## 2020-08-18 NOTE — ED Triage Notes (Signed)
Pt from home with ems c.o abd pain, 1 episode of vomiting and chest pain/sob that started this morning. Pt a.o, VSS

## 2020-08-19 ENCOUNTER — Observation Stay (HOSPITAL_COMMUNITY): Payer: Medicaid Other

## 2020-08-19 ENCOUNTER — Ambulatory Visit: Payer: Self-pay

## 2020-08-19 ENCOUNTER — Telehealth: Payer: Medicaid Other

## 2020-08-19 ENCOUNTER — Observation Stay (HOSPITAL_BASED_OUTPATIENT_CLINIC_OR_DEPARTMENT_OTHER): Payer: Medicaid Other

## 2020-08-19 ENCOUNTER — Encounter (HOSPITAL_COMMUNITY): Payer: Self-pay | Admitting: Internal Medicine

## 2020-08-19 ENCOUNTER — Emergency Department (HOSPITAL_COMMUNITY): Payer: Medicaid Other

## 2020-08-19 DIAGNOSIS — I361 Nonrheumatic tricuspid (valve) insufficiency: Secondary | ICD-10-CM

## 2020-08-19 DIAGNOSIS — I4891 Unspecified atrial fibrillation: Secondary | ICD-10-CM | POA: Insufficient documentation

## 2020-08-19 DIAGNOSIS — I10 Essential (primary) hypertension: Secondary | ICD-10-CM

## 2020-08-19 DIAGNOSIS — I25708 Atherosclerosis of coronary artery bypass graft(s), unspecified, with other forms of angina pectoris: Secondary | ICD-10-CM | POA: Diagnosis not present

## 2020-08-19 DIAGNOSIS — I480 Paroxysmal atrial fibrillation: Secondary | ICD-10-CM | POA: Diagnosis not present

## 2020-08-19 LAB — COMPREHENSIVE METABOLIC PANEL
ALT: 26 U/L (ref 0–44)
AST: 23 U/L (ref 15–41)
Albumin: 3.5 g/dL (ref 3.5–5.0)
Alkaline Phosphatase: 110 U/L (ref 38–126)
Anion gap: 10 (ref 5–15)
BUN: 18 mg/dL (ref 8–23)
CO2: 24 mmol/L (ref 22–32)
Calcium: 9.1 mg/dL (ref 8.9–10.3)
Chloride: 107 mmol/L (ref 98–111)
Creatinine, Ser: 1 mg/dL (ref 0.44–1.00)
GFR, Estimated: >60 mL/min (ref >60)
Glucose, Bld: 102 mg/dL — ABNORMAL HIGH (ref 70–99)
Potassium: 3.2 mmol/L — ABNORMAL LOW (ref 3.5–5.1)
Sodium: 141 mmol/L (ref 135–145)
Total Bilirubin: 0.6 mg/dL (ref 0.3–1.2)
Total Protein: 6.3 g/dL — ABNORMAL LOW (ref 6.5–8.1)

## 2020-08-19 LAB — CBC WITH DIFFERENTIAL/PLATELET
Abs Immature Granulocytes: 0.03 10*3/uL (ref 0.00–0.07)
Basophils Absolute: 0 10*3/uL (ref 0.0–0.1)
Basophils Relative: 0 %
Eosinophils Absolute: 0.1 10*3/uL (ref 0.0–0.5)
Eosinophils Relative: 2 %
HCT: 36.2 % (ref 36.0–46.0)
Hemoglobin: 11.1 g/dL — ABNORMAL LOW (ref 12.0–15.0)
Immature Granulocytes: 1 %
Lymphocytes Relative: 34 %
Lymphs Abs: 2.1 10*3/uL (ref 0.7–4.0)
MCH: 21.9 pg — ABNORMAL LOW (ref 26.0–34.0)
MCHC: 30.7 g/dL (ref 30.0–36.0)
MCV: 71.3 fL — ABNORMAL LOW (ref 80.0–100.0)
Monocytes Absolute: 0.6 10*3/uL (ref 0.1–1.0)
Monocytes Relative: 10 %
Neutro Abs: 3.3 10*3/uL (ref 1.7–7.7)
Neutrophils Relative %: 53 %
Platelets: 214 10*3/uL (ref 150–400)
RBC: 5.08 MIL/uL (ref 3.87–5.11)
RDW: 17.3 % — ABNORMAL HIGH (ref 11.5–15.5)
WBC: 6.2 10*3/uL (ref 4.0–10.5)
nRBC: 0 % (ref 0.0–0.2)

## 2020-08-19 LAB — RESP PANEL BY RT-PCR (FLU A&B, COVID) ARPGX2
Influenza A by PCR: NEGATIVE
Influenza B by PCR: NEGATIVE
SARS Coronavirus 2 by RT PCR: NEGATIVE

## 2020-08-19 LAB — ECHOCARDIOGRAM COMPLETE
Height: 67 in
S' Lateral: 3.7 cm
Weight: 1808 oz

## 2020-08-19 LAB — HIV ANTIBODY (ROUTINE TESTING W REFLEX): HIV Screen 4th Generation wRfx: NONREACTIVE

## 2020-08-19 LAB — TROPONIN I (HIGH SENSITIVITY)
Troponin I (High Sensitivity): 124 ng/L (ref <18)
Troponin I (High Sensitivity): 149 ng/L (ref <18)
Troponin I (High Sensitivity): 153 ng/L (ref <18)

## 2020-08-19 LAB — TSH: TSH: 0.897 u[IU]/mL (ref 0.350–4.500)

## 2020-08-19 LAB — MAGNESIUM: Magnesium: 1.6 mg/dL — ABNORMAL LOW (ref 1.7–2.4)

## 2020-08-19 MED ORDER — ACETAMINOPHEN 325 MG PO TABS
650.0000 mg | ORAL_TABLET | Freq: Four times a day (QID) | ORAL | Status: DC | PRN
  Administered 2020-08-22: 650 mg via ORAL
  Filled 2020-08-19 (×2): qty 2

## 2020-08-19 MED ORDER — DILTIAZEM HCL 60 MG PO TABS
60.0000 mg | ORAL_TABLET | Freq: Once | ORAL | Status: AC
  Administered 2020-08-19: 60 mg via ORAL
  Filled 2020-08-19: qty 1

## 2020-08-19 MED ORDER — DILTIAZEM HCL-DEXTROSE 125-5 MG/125ML-% IV SOLN (PREMIX)
5.0000 mg/h | INTRAVENOUS | Status: DC
  Administered 2020-08-19: 5 mg/h via INTRAVENOUS
  Filled 2020-08-19: qty 125

## 2020-08-19 MED ORDER — PANTOPRAZOLE SODIUM 40 MG PO TBEC
40.0000 mg | DELAYED_RELEASE_TABLET | Freq: Every day | ORAL | Status: DC
  Administered 2020-08-19 – 2020-08-20 (×2): 40 mg via ORAL
  Filled 2020-08-19 (×3): qty 1

## 2020-08-19 MED ORDER — OXYCODONE HCL 5 MG PO TABS
5.0000 mg | ORAL_TABLET | ORAL | Status: DC | PRN
  Administered 2020-08-20 – 2020-08-22 (×4): 5 mg via ORAL
  Filled 2020-08-19 (×4): qty 1

## 2020-08-19 MED ORDER — IOHEXOL 300 MG/ML  SOLN
100.0000 mL | Freq: Once | INTRAMUSCULAR | Status: AC | PRN
  Administered 2020-08-19: 100 mL via INTRAVENOUS

## 2020-08-19 MED ORDER — APIXABAN 5 MG PO TABS
5.0000 mg | ORAL_TABLET | Freq: Two times a day (BID) | ORAL | Status: DC
Start: 2020-08-19 — End: 2020-08-21
  Administered 2020-08-19 – 2020-08-20 (×4): 5 mg via ORAL
  Filled 2020-08-19 (×5): qty 1

## 2020-08-19 MED ORDER — ATORVASTATIN CALCIUM 80 MG PO TABS
80.0000 mg | ORAL_TABLET | Freq: Every day | ORAL | Status: DC
  Administered 2020-08-19 – 2020-08-20 (×2): 80 mg via ORAL
  Filled 2020-08-19 (×3): qty 1

## 2020-08-19 MED ORDER — SENNOSIDES-DOCUSATE SODIUM 8.6-50 MG PO TABS
2.0000 | ORAL_TABLET | Freq: Two times a day (BID) | ORAL | Status: DC
  Administered 2020-08-19 – 2020-08-22 (×5): 2 via ORAL
  Filled 2020-08-19 (×7): qty 2

## 2020-08-19 MED ORDER — ASPIRIN 81 MG PO CHEW
81.0000 mg | CHEWABLE_TABLET | Freq: Every day | ORAL | Status: DC
  Administered 2020-08-19 – 2020-08-20 (×2): 81 mg via ORAL
  Filled 2020-08-19 (×3): qty 1

## 2020-08-19 MED ORDER — POTASSIUM CHLORIDE CRYS ER 20 MEQ PO TBCR
40.0000 meq | EXTENDED_RELEASE_TABLET | Freq: Once | ORAL | Status: AC
  Administered 2020-08-19: 40 meq via ORAL
  Filled 2020-08-19: qty 2

## 2020-08-19 MED ORDER — METOPROLOL TARTRATE 25 MG PO TABS
25.0000 mg | ORAL_TABLET | Freq: Two times a day (BID) | ORAL | Status: DC
  Administered 2020-08-19 – 2020-08-20 (×3): 25 mg via ORAL
  Filled 2020-08-19 (×4): qty 1

## 2020-08-19 MED ORDER — CITALOPRAM HYDROBROMIDE 20 MG PO TABS
10.0000 mg | ORAL_TABLET | Freq: Every day | ORAL | Status: DC
  Administered 2020-08-19 – 2020-08-22 (×4): 10 mg via ORAL
  Filled 2020-08-19 (×5): qty 1

## 2020-08-19 MED ORDER — TRAMADOL HCL 50 MG PO TABS
50.0000 mg | ORAL_TABLET | Freq: Four times a day (QID) | ORAL | Status: DC | PRN
  Administered 2020-08-19 – 2020-08-22 (×5): 50 mg via ORAL
  Filled 2020-08-19 (×5): qty 1

## 2020-08-19 MED ORDER — ACETAMINOPHEN 650 MG RE SUPP: 650.0000 mg | Freq: Four times a day (QID) | RECTAL | Status: DC | PRN

## 2020-08-19 NOTE — ED Notes (Signed)
Pt refused labs from phlebotomy and RN at this time

## 2020-08-19 NOTE — Consult Note (Addendum)
Cardiology Consultation:   Patient ID: RODNESHA ELIE MRN: [id number]; DOB: May 23, 1956  Admit date: 08/18/2020 Date of Consult: 08/19/2020  Primary Care Provider: [name], FNP [facility] Cardiologist: Rollene Rotunda, MD  [facility] Electrophysiologist:  None    Patient Profile:   Kimberly [name] is a 65 y.o. female with a history of CAD s/p CABG in 2008, paroxysmal atrial fibrillation on Eliquis, chronic dyspnea, occluded internal carotid arteries bilaterally, stroke in 2019 with residual life sided weakness, hypertension, hyperlipidemia, and long smoking history who is being seen today for the evaluation of atrial fibrillation at the request of Dr. Margo Aye.  History of Present Illness:   Kimberly [name] is a 65 year old female with the above history who is followed by Dr. Antoine Poche. Myoview in 03/2020 performed for further evaluation of dyspnea prior to colonoscopy showed a large partially reversible lateral wall perfusion defect consistent with infarct with a significant region of peri-infarct ischemia. Patient was recently seen by Dr. Antoine Poche on 07/28/2020 at which time she reported dyspnea on exertion. A friend who came with her to the visit reported that she continues to smoke and has been smoking more than she ever has in the past. Therefore, dyspnea felt to be secondary to long smoking history. She was advised to follow-up in 12 months.   Patient presented to the ED on 08/19/2019 for further evaluation of sudden onset of abdominal pain and vomiting.  Patient was in her usual state of health until the day of presentation when she developed sudden onset right-sided abdominal pain.  She had associated nausea and 2 episodes of vomiting at 4 AM the other at 6AM.  Before these episodes of emesis, patient did note a temporary episode of chest pain that radiated to her left arm.  She has difficulty describing the pain and states she just felt "nauseous."  She ranked the pain as a 5-6 out of  10 on the pain scale. The pain resolved on its own. She denied any other recent episodes of chest pain and has had no recurrence of this.  She has chronic shortness of breath with exertion states this is.  She states her breathing laying down at night and occasionally wakes up feeling short of breath at home but I don't think this is true orthopnea or PND.  She also notes some lower extremity edema over the last couple weeks I do not appreciate this on exam.  She reports occasional heart racing.  She has also had chronic lightheadedness and dizziness since her stroke pain; however she has had no syncopal episodes. No abnormal bleeding in urine or stools.  In the ED, patient mildly hypertensive but vitals stable. EKG showed normal sinus rhythm, rate 72 bpm, with LVH and deep T wave inversions in V3-V5. No significant changes from prior tracings. High-sensitivity troponin negative. Chest x-ray showed no acute findings. Abdominal/pelvic CT showed no acute findings to explain GI symptoms but did note a focal dilatation of the infrarenal abdominal aorta measuring up to 2.6cm. WBC 7.2, Hgb 11.7, Plts 229. Na 142, K 3.5, Glucose 77, BUN 10, Cr 0.86. LFTs normal. Lipase normal. Respiratory panel negative for COVID and influenza. Patient was given pain relief medications and abdominal pain resolved. However, patient then developed atrial fibrillation with RVR. She was started on a Cardizem drip and admitted for evaluation.   At the time of this evaluation, patient resting comfortably in bed. She states she feels "so-so." She is chest pain free but she continues to  have right sided abdominal pain with palpation on exam. She also reported pain when I asked her to sit up so I could listen to her lungs.   She continues to smoke. She states she smokes about 6-10 cigarettes 2-3 days per day. No alcohol or recreational drug use. She does have a family history of heart disease with her father having multiple heart attacks.    Past Medical History:  Diagnosis Date  . Bicipital tenosynovitis   . Coronary artery disease 05/2007   [id number]   . Fall 02/13/2018  . Family history of ischemic heart disease   . Gout   . Hammer toe   . Hip, thigh, leg, and ankle, insect bite, nonvenomous, without mention of infection(916.4)   . Hyperlipidemia   . Hypertension   . Other abnormality of red blood cells   . Rotator cuff syndrome of left shoulder   . Stroke [facility])     Past Surgical History:  Procedure Laterality Date  . CARDIAC CATHETERIZATION    . COLONOSCOPY WITH PROPOFOL N/A 01/30/2020   Procedure: COLONOSCOPY WITH PROPOFOL;  Surgeon: Carol Ada, MD;  Location: [facility] ENDOSCOPY;  Service: Endoscopy;  Laterality: N/A;  . CORONARY ARTERY BYPASS GRAFT     triple  . CORONARY ARTERY BYPASS GRAFT  2008  . POLYPECTOMY  01/30/2020   Procedure: POLYPECTOMY;  Surgeon: Carol Ada, MD;  Location: [facility] ENDOSCOPY;  Service: Endoscopy;;  . TUBAL LIGATION       Home Medications:  Prior to Admission medications   Medication Sig Start Date End Date Taking? Authorizing Provider  amLODipine (NORVASC) 5 MG tablet Take 1&1/2 tablets 7.5 mg daily Patient taking differently: Take 7.5 mg by mouth daily. Take 1&1/2 tablets 7.5 mg daily 03/18/20  Yes Hochrein, Jeneen Rinks, MD  apixaban (ELIQUIS) 5 MG TABS tablet Take 1 tablet (5 mg total) by mouth 2 (two) times daily. 07/14/20  Yes [name], FNP  aspirin (QC LO-DOSE ASPIRIN) 81 MG EC tablet Take 1 tablet (81 mg total) by mouth daily. Swallow whole. 07/14/20  Yes [name], FNP  atorvastatin (LIPITOR) 80 MG tablet TAKE 1 TABLET BY MOUTH DAILY Patient taking differently: Take 80 mg by mouth daily. 08/18/20  Yes [name], FNP  meclizine (ANTIVERT) 25 MG tablet Take 1 tablet (25 mg total) by mouth 3 (three) times daily as needed for dizziness. 04/25/20  Yes Isla Pence, MD  MITIGARE 0.6 MG CAPS TAKE 1 CAPSULE BY MOUTH 2 TIMES DAILY Patient taking differently: Take 1 capsule by mouth 2  (two) times daily as needed (pain and inflammation). 07/22/20  Yes [name], FNP  Multiple Vitamin (MULTIVITAMIN) capsule Take 1 capsule by mouth daily.   Yes [provider]  omeprazole (PRILOSEC) 20 MG capsule Take 1 capsule (20 mg total) by mouth 2 (two) times daily before a meal. 05/17/20  Yes [name], [place]  Blood Pressure Monitoring (BLOOD PRESSURE KIT) DEVI Use as directed to check blood pressure daily dx: I10 Patient not taking: Reported on 08/12/2020 01/20/20   [name], FNP  citalopram (CELEXA) 10 MG tablet Take 1 tablet (10 mg total) by mouth daily. Patient not taking: No sig reported 07/14/20 07/14/21  [name], FNP  megestrol (MEGACE) 20 MG tablet Take 1 tablet (20 mg total) by mouth daily. Patient not taking: No sig reported 07/22/20   [name], FNP    Inpatient Medications: Scheduled Meds: . apixaban  5 mg Oral BID  . atorvastatin  80 mg Oral Daily  . citalopram  10 mg Oral Daily  . pantoprazole  40 mg Oral Daily   Continuous Infusions: . diltiazem (CARDIZEM) infusion 7.5 mg/hr (08/19/20 0419)   PRN Meds: acetaminophen **OR** acetaminophen  Allergies:    Allergies  Allergen Reactions  . Clonidine Derivatives Other (See Comments)    Patch causes scars  . Spironolactone Rash    Social History:   Social History   Socioeconomic History  . Marital status: Single    Spouse name: Not on file  . Number of children: 2  . Years of education: Not on file  . Highest education level: 11th grade  Occupational History  . Occupation: retired  Tobacco Use  . Smoking status: Former Smoker    Packs/day: 2.00    Years: 35.00    Pack years: 70.00    Types: Cigarettes    Quit date: 08/12/2019    Years since quitting: 1.0  . Smokeless tobacco: Former Neurosurgeon    Quit date: 08/09/2008  . Tobacco comment: had assistance with the quit smoking - had patches and lozenges  Vaping Use  . Vaping Use: Never used  Substance and Sexual Activity  . Alcohol  use: Yes    Comment: ocassionally wine  . Drug use: Yes    Types: Marijuana    Comment: history of cocaine use  . Sexual activity: Not on file  Other Topics Concern  . Not on file  Social History Narrative   Patient is right-handed. She lives alone in a one level home, 2 steps to enter. Her family is very active in her care.    Social Determinants of Health   Financial Resource Strain: Not on file  Food Insecurity: Not on file  Transportation Needs: Not on file  Physical Activity: Not on file  Stress: Not on file  Social Connections: Not on file  Intimate Partner Violence: Not on file    Family History:   Family History  Problem Relation Age of Onset  . Hypertension Mother   . Heart attack Father   . Diabetes Father   . Kidney failure Father   . Anxiety disorder Sister   . Sarcoidosis Sister      ROS:  Please see the history of present illness.  All other ROS reviewed and negative.     Physical Exam/Data:   Vitals:   08/19/20 0730 08/19/20 0753 08/19/20 0830 08/19/20 1248  BP: 108/83  107/75 113/77  Pulse: 74  89 (!) 55  Resp:   16 20  Temp:  97.7 F (36.5 C) 98.1 F (36.7 C) 98.7 F (37.1 C)  TempSrc:   Oral Oral  SpO2: 97%  98% 99%  Weight:      Height:       No intake or output data in the 24 hours ending 08/19/20 1454 Last 3 Weights 08/18/2020 07/28/2020 05/17/2020  Weight (lbs) 113 lb 114 lb 9.6 oz (No Data)  Weight (kg) 51.256 kg 51.982 kg (No Data)     Body mass index is 17.7 kg/m.  General: 66 y.o. female resting comfortably in no acute distress.  HEENT: Normocephalic and atraumatic. Sclera clear. EOMs intact. Neck: Supple. No JVD. Heart: Irregularly irregular rhythm with normal rate. No murmurs, gallops, or rubs. Radial and distal pedal pulses 2+ and equal bilaterally. Lungs: No increased work of breathing. Clear to ausculation bilaterally. No wheezes, rhonchi, or rales.  Abdomen: Soft and non-distended but tender to palpation on right side. Bowel  sounds present in all 4 quadrants.  Extremities: No lower  extremity edema.    Skin: Warm and dry. Neuro: Alert and oriented x3. Residual left sided weakness from prior stroke. Psych: Flat affect.   EKG:  The EKG was personally reviewed and demonstrates:   - EKG from 08/18/2020: Normal sinus rhythm, 72 bpm, with LVH and T wave inversions in anterolateral leads. - EKG from 08/19/2020: Atrial fibrillation, rate 57 bpm, with LVH and T wave inversions in anterolateral leads.  Telemetry:  Telemetry was personally reviewed and demonstrates:  Atrial fibrillation with rates in the 50's to 70's. Occasional pause (longest 2.15 seconds).  Relevant CV Studies:  Lexiscan Myoview 04/01/2020:  Patient was in atrial fibrillation with RVR during study, HR 110-120s  Study was not gated due to atrial fibrillation  Defect 1: There is a large defect of severe severity present in the basal inferolateral, basal anterolateral, mid inferolateral, mid anterolateral and apical lateral location.  Findings consistent with prior myocardial infarction with peri-infarct ischemia.  This is an intermediate risk study.   Large partially reversible lateral wall perfusion defect.  Consistent with infarct with significant region of peri-infarct ischemia  Laboratory Data:  High Sensitivity Troponin:   Recent Labs  Lab 08/18/20 1027 08/19/20 0911 08/19/20 1118  TROPONINIHS 8 124* 149*     Chemistry Recent Labs  Lab 08/18/20 1027 08/19/20 0911  NA 142 141  K 3.5 3.2*  CL 107 107  CO2 25 24  GLUCOSE 77 102*  BUN 10 18  CREATININE 0.86 1.00  CALCIUM 9.9 9.1  GFRNONAA >60 >60  ANIONGAP 10 10    Recent Labs  Lab 08/18/20 1027 08/19/20 0911  PROT 7.8 6.3*  ALBUMIN 4.4 3.5  AST 27 23  ALT 30 26  ALKPHOS 124 110  BILITOT 0.7 0.6   Hematology Recent Labs  Lab 08/18/20 1027 08/19/20 0911  WBC 7.2 6.2  RBC 5.41* 5.08  HGB 11.7* 11.1*  HCT 39.0 36.2  MCV 72.1* 71.3*  MCH 21.6* 21.9*  MCHC 30.0  30.7  RDW 17.7* 17.3*  PLT 229 214   BNPNo results for input(s): BNP, PROBNP in the last 168 hours.  DDimer No results for input(s): DDIMER in the last 168 hours.   Radiology/Studies:  DG Chest 2 View  Result Date: 08/18/2020 CLINICAL DATA:  Chest pain.  Abdominal pain. EXAM: CHEST - 2 VIEW COMPARISON:  04/25/2020. FINDINGS: Prior CABG. Stable cardiomegaly. No pulmonary venous congestion. No focal infiltrate. No pleural effusion or pneumothorax. Degenerative changes scoliosis thoracic spine. IMPRESSION: 1. Prior CABG. Stable cardiomegaly. 2. No acute pulmonary disease. Electronically Signed   By: Marcello Moores  Register   On: 08/18/2020 10:55   CT ABDOMEN PELVIS W CONTRAST  Result Date: 08/19/2020 CLINICAL DATA:  Right-sided abdominal pain, polyuria EXAM: CT ABDOMEN AND PELVIS WITH CONTRAST TECHNIQUE: Multidetector CT imaging of the abdomen and pelvis was performed using the standard protocol following bolus administration of intravenous contrast. CONTRAST:  137m OMNIPAQUE IOHEXOL 300 MG/ML  SOLN COMPARISON:  02/24/2009 FINDINGS: Lower chest: No acute pleural or parenchymal lung disease. Hepatobiliary: No focal liver abnormality is seen. No gallstones, gallbladder wall thickening, or biliary dilatation. Pancreas: Unremarkable. No pancreatic ductal dilatation or surrounding inflammatory changes. Spleen: Normal in size without focal abnormality. Adrenals/Urinary Tract: Multiple areas of left renal cortical scarring. Right kidney enhances normally. No urinary tract calculi or obstructive uropathy. The adrenals are normal. Bladder is decompressed, which limits its evaluation. Stomach/Bowel: No bowel obstruction or ileus. No bowel wall thickening or inflammatory change. Vascular/Lymphatic: Diffuse atherosclerosis of the aorta. Focal dilation of  the infrarenal abdominal aorta measuring up to 2.8 cm. No dissection. No pathologic adenopathy. Reproductive: Uterus is atrophic. Calcified fibroid within the uterine  fundus. No adnexal masses. Other: No free fluid or free intraperitoneal gas. No abdominal wall hernia. Musculoskeletal: No acute or destructive bony lesions. Reconstructed images demonstrate no additional findings. IMPRESSION: 1. No acute intra-abdominal or intrapelvic process. 2. 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 Recommend follow-up ultrasound every 5 years. This recommendation follows ACR consensus guidelines: White Paper of the ACR Incidental Findings Committee II on Vascular Findings. J Am Coll Radiol 2013; 65:784-696. 3. Aortic Atherosclerosis (ICD10-I70.0). Electronically Signed   By: Randa Ngo M.D.   On: 08/19/2020 00:57     Assessment and Plan:   Paroxysmal Atrial Fibrillation with RVR - Patient presented with abdominal pain and vomiting. Symptoms resolved but then she went into atrial fibrillation with RVR in the ED and was started on IV Cardizem. She does have a history of atrial fibrillation.  - Rates currently well controlled ranging from 50's to 70's. - Potassium 3.2 today. Will replete.  - TSH normal.  - Will check Magnesium.  - Echo has already been ordered. - Can stop IV Cardizem and start Lopressor 55m twice daily.  - CHA2DS2-VASC = 5 (CAD, HTN, stroke x2, female). Continue Eliquis 541mtwice daily. Patient does not think she had missed any dose of her Eliquis in the last 3 weeks but states her Caregiver is in charge of her medications. She is relatively asymptomatic with her atrial fibrillation so could likely follow-up up as outpatient and then arrange outpatient DCCV after confirmed 3 weeks of uninterrupted anticoagulation. If she is still here tomorrow and there is room on the board, could possibly perform TEE/DCCV tomorrow.  CHA2DS2-VASc Score = 5  This indicates a 7.2% annual risk of stroke. The patient's score is based upon: CHF History: No HTN History: Yes Diabetes History: No Stroke History: Yes Vascular Disease History:  Yes Age Score: 0 Gender Score: 1   Chest Pain Elevated Troponin  History of CAD s/p CABG in 2008 - She did have one episode of chest pain with radiation to left shoulder with her GI symptoms yesterday that she has difficulty describing. Pain resolved on its own. No other episodes of chest pain. Last Myoview in 03/2020 of this year showed a large partially reversible lateral wall perfusion defect consistent with infarct with a significant region of peri-infarct ischemia. - EKG shows T wave inversions in anterolateral leads as well as lead II but these are not new. - Initial igh-sensitivity troponin negative but repeat 124 >> 149. Not consistent with ACS. - Echo pending. - Continue aspirin and high-intensity statin. - Will wait Echo results but do not suspect she will require any additional ischemic evaluation. Possible demand ischemia secondary to atrial fibrillation with RVR with underlying CAD.  Carotid Artery Disease - Known bilaterally occluded ICAs. - Being managed medically. Continue aspirin and high-intensity statin.   Dilatation of Infrarenal Abdominal Aorta - CT this admission showed focal dilation of infrarenal abdominal aorta measuring up to 2.8 cm.  - Follow-up ultrasound recommended in 5 years.  Hypertension - BP mildly elevated on presentation but improved with Cardizem drip. - Will stop IV Cardizem and start Lopressor as above. - Can restart home Amlodipine if needed.  Hyperlipidemia - Continue home Lipitor 8079maily.  Chronic Anemia - Hemoglobin stable at 11.1 which is around baseline.   For questions or updates, please contact [facility]  Please consult [email or web address] for contact info under    Signed, Darreld Mclean, PA-C  08/19/2020 2:54 PM   I have seen and examined the patient along with Darreld Mclean, PA-C .  I have reviewed the chart, notes and new data.  I agree with PA/NP's note.  Key new complaints: no CV complaints, but has constant mild  discomfort in the R flank and RUQ. Not colicky. Last BM 2 days ago. Key examination changes: irregular rhythm, otherwise normal CV exam. Looks very comfortable. Tender to palpation in the RUQ and R flank, but no frank peritoneal signs. Afebrile. Key new findings / data: ECG remarkable for arrhythmia and longstanding changes of LVH with prominent ST-T changes. Mild adynamic increase in troponin is attributable to demand ischemia during tachyarrhythmia and does not indicate a true acute coronary event. Normal LFTs and no gallbladder or other pathology on abdominal CT.  PLAN: Will transition from IV diltiazem to oral metoprolol for rate control. Continue the oral anticoagulant. No indication she will require imminent surgery. High likelihood she will return to NSR spontaneously; if not we can plan elective outpatient cardioversion (at this point, no slots for inpatient DCCV tomorrow).  Sanda Klein, MD, [place] [phone] 08/19/2020, 3:23 PM

## 2020-08-19 NOTE — H&P (Signed)
History and Physical    Kimberly [name]:[id number] DOB: 1955/09/13 DOA: 08/18/2020  PCP: [name], FNP  Patient coming from: Home.  Chief Complaint: Abdominal pain.  HPI: Kimberly [name] is a 65 y.o. female with history of CAD status post CABG, A. fib, stroke with left-sided weakness and aneurysm presents to the ER with complaint of abdominal pain.  Pain is mostly in the right lower quadrant had 2 episode of nausea vomiting yesterday morning.  Pain has been persistent and has no diarrhea.  Denies chest pain or shortness of breath.  ED Course: In the ER CT abdomen pelvis does not show any acute.  After giving pain relief medication patient's abdominal pain resolved.  But patient was in A. fib with RVR for which patient was started on Cardizem drip after patient refused cardioversion.  Labs are largely unremarkable except for anemia with hemoglobin of 11.7.  Patient admitted for further management of A. fib with RVR.  Covid test was negative.  Review of Systems: As per HPI, rest all negative.   Past Medical History:  Diagnosis Date  . Bicipital tenosynovitis   . Coronary artery disease 05/2007   [id number]   . Fall 02/13/2018  . Family history of ischemic heart disease   . Gout   . Hammer toe   . Hip, thigh, leg, and ankle, insect bite, nonvenomous, without mention of infection(916.4)   . Hyperlipidemia   . Hypertension   . Other abnormality of red blood cells   . Rotator cuff syndrome of left shoulder   . Stroke [facility])     Past Surgical History:  Procedure Laterality Date  . CARDIAC CATHETERIZATION    . COLONOSCOPY WITH PROPOFOL N/A 01/30/2020   Procedure: COLONOSCOPY WITH PROPOFOL;  Surgeon: Carol Ada, MD;  Location: [facility] ENDOSCOPY;  Service: Endoscopy;  Laterality: N/A;  . CORONARY ARTERY BYPASS GRAFT     triple  . CORONARY ARTERY BYPASS GRAFT  2008  . POLYPECTOMY  01/30/2020   Procedure: POLYPECTOMY;  Surgeon: Carol Ada, MD;  Location: [facility] ENDOSCOPY;  Service:  Endoscopy;;  . TUBAL LIGATION       reports that she quit smoking about a year ago. Her smoking use included cigarettes. She has a 70.00 pack-year smoking history. She quit smokeless tobacco use about 12 years ago. She reports current alcohol use. She reports current drug use. Drug: Marijuana.  Allergies  Allergen Reactions  . Clonidine Derivatives Other (See Comments)    Patch causes scars  . Spironolactone Rash    Family History  Problem Relation Age of Onset  . Hypertension Mother   . Heart attack Father   . Diabetes Father   . Kidney failure Father   . Anxiety disorder Sister   . Sarcoidosis Sister     Prior to Admission medications   Medication Sig Start Date End Date Taking? Authorizing Provider  amLODipine (NORVASC) 5 MG tablet Take 1&1/2 tablets 7.5 mg daily Patient taking differently: Take 7.5 mg by mouth daily. Take 1&1/2 tablets 7.5 mg daily 03/18/20   [name], MD  apixaban (ELIQUIS) 5 MG TABS tablet Take 1 tablet (5 mg total) by mouth 2 (two) times daily. 07/14/20   [name], FNP  aspirin (QC LO-DOSE ASPIRIN) 81 MG EC tablet Take 1 tablet (81 mg total) by mouth daily. Swallow whole. 07/14/20   [name], FNP  atorvastatin (LIPITOR) 80 MG tablet TAKE 1 TABLET BY MOUTH DAILY Patient taking differently: Take 80 mg by mouth daily. 08/18/20  [name], FNP  Blood Pressure Monitoring (BLOOD PRESSURE KIT) DEVI Use as directed to check blood pressure daily dx: I10 Patient not taking: Reported on 08/12/2020 01/20/20   [name], FNP  citalopram (CELEXA) 10 MG tablet Take 1 tablet (10 mg total) by mouth daily. 07/14/20 07/14/21  [name], FNP  meclizine (ANTIVERT) 25 MG tablet Take 1 tablet (25 mg total) by mouth 3 (three) times daily as needed for dizziness. 04/25/20   Isla Pence, MD  megestrol (MEGACE) 20 MG tablet Take 1 tablet (20 mg total) by mouth daily. Patient not taking: Reported on 08/12/2020 07/22/20   [name], FNP  MITIGARE 0.6 MG CAPS  TAKE 1 CAPSULE BY MOUTH 2 TIMES DAILY Patient taking differently: Take 1 capsule by mouth in the morning and at bedtime. 07/22/20   [name], FNP  omeprazole (PRILOSEC) 20 MG capsule Take 1 capsule (20 mg total) by mouth 2 (two) times daily before a meal. 05/17/20   [name], FNP    Physical Exam: Constitutional: Moderately built and nourished. Vitals:   08/19/20 0315 08/19/20 0317 08/19/20 0355 08/19/20 0415  BP: 118/86 118/86 (!) 119/93 (!) 117/99  Pulse: (!) 116 (!) 126 (!) 118 (!) 116  Resp:  18 18 16   Temp:    97.8 F (36.6 C)  TempSrc:    Oral  SpO2: 97% 97% 100% 95%  Weight:      Height:       Eyes: Anicteric no pallor. ENMT: No discharge from the ears eyes nose or mouth. Neck: No mass felt.  No neck rigidity. Respiratory: No rhonchi or crepitations. Cardiovascular: S1-S2 heard. Abdomen: Soft nontender bowel sounds present. Musculoskeletal: No edema. Skin: No rash. Neurologic: Alert awake oriented to time place and person.  Has mild weakness of the left upper and lower extremities with some contractures of the left hand fingers. Psychiatric: Appears normal.  Normal affect.   Labs on Admission: I have personally reviewed following labs and imaging studies  CBC: Recent Labs  Lab 08/18/20 1027  WBC 7.2  HGB 11.7*  HCT 39.0  MCV 72.1*  PLT 675   Basic Metabolic Panel: Recent Labs  Lab 08/18/20 1027  NA 142  K 3.5  CL 107  CO2 25  GLUCOSE 77  BUN 10  CREATININE 0.86  CALCIUM 9.9   GFR: Estimated Creatinine Clearance: 53.5 mL/min (by C-G formula based on SCr of 0.86 mg/dL). Liver Function Tests: Recent Labs  Lab 08/18/20 1027  AST 27  ALT 30  ALKPHOS 124  BILITOT 0.7  PROT 7.8  ALBUMIN 4.4   Recent Labs  Lab 08/18/20 1027  LIPASE 18   No results for input(s): AMMONIA in the last 168 hours. Coagulation Profile: No results for input(s): INR, PROTIME in the last 168 hours. Cardiac Enzymes: No results for input(s): CKTOTAL, CKMB,  CKMBINDEX, TROPONINI in the last 168 hours. BNP (last 3 results) No results for input(s): PROBNP in the last 8760 hours. HbA1C: No results for input(s): HGBA1C in the last 72 hours. CBG: No results for input(s): GLUCAP in the last 168 hours. Lipid Profile: No results for input(s): CHOL, HDL, LDLCALC, TRIG, CHOLHDL, LDLDIRECT in the last 72 hours. Thyroid Function Tests: No results for input(s): TSH, T4TOTAL, FREET4, T3FREE, THYROIDAB in the last 72 hours. Anemia Panel: No results for input(s): VITAMINB12, FOLATE, FERRITIN, TIBC, IRON, RETICCTPCT in the last 72 hours. Urine analysis:    Component Value Date/Time   COLORURINE YELLOW 02/13/2018 [place] 02/13/2018  1209   LABSPEC 1.014 02/13/2018 1209   PHURINE 5.0 02/13/2018 1209   GLUCOSEU NEGATIVE 02/13/2018 1209   HGBUR SMALL (A) 02/13/2018 1209   BILIRUBINUR NEGATIVE 02/13/2018 1209   KETONESUR 5 (A) 02/13/2018 1209   PROTEINUR 100 (A) 02/13/2018 1209   UROBILINOGEN 0.2 05/26/2011 2243   NITRITE NEGATIVE 02/13/2018 1209   LEUKOCYTESUR NEGATIVE 02/13/2018 1209   Sepsis Labs: @LABRCNTIP (procalcitonin:4,lacticidven:4) ) Recent Results (from the past 240 hour(s))  Resp Panel by RT-PCR (Flu A&B, Covid) Nasopharyngeal Swab     Status: None   Collection Time: 08/19/20  3:54 AM   Specimen: Nasopharyngeal Swab; Nasopharyngeal(NP) swabs in vial transport medium  Result Value Ref Range Status   SARS Coronavirus 2 by RT PCR NEGATIVE NEGATIVE Final    Comment: (NOTE) SARS-CoV-2 target nucleic acids are NOT DETECTED.  The SARS-CoV-2 RNA is generally detectable in upper respiratory specimens during the acute phase of infection. The lowest concentration of SARS-CoV-2 viral copies this assay can detect is 138 copies/mL. A negative result does not preclude SARS-Cov-2 infection and should not be used as the sole basis for treatment or other patient management decisions. A negative result may occur with  improper specimen  collection/handling, submission of specimen other than nasopharyngeal swab, presence of viral mutation(s) within the areas targeted by this assay, and inadequate number of viral copies(<138 copies/mL). A negative result must be combined with clinical observations, patient history, and epidemiological information. The expected result is Negative.  Fact Sheet for Patients:  EntrepreneurPulse.com.au  Fact Sheet for Healthcare Providers:  IncredibleEmployment.be  This test is no t yet approved or cleared by the [place] FDA and  has been authorized for detection and/or diagnosis of SARS-CoV-2 by FDA under an Emergency Use Authorization (EUA). This EUA will remain  in effect (meaning this test can be used) for the duration of the COVID-19 declaration under Section 564(b)(1) of the Act, 21 U.S.C.section 360bbb-3(b)(1), unless the authorization is terminated  or revoked sooner.       Influenza A by PCR NEGATIVE NEGATIVE Final   Influenza B by PCR NEGATIVE NEGATIVE Final    Comment: (NOTE) The Xpert Xpress SARS-CoV-2/FLU/RSV plus assay is intended as an aid in the diagnosis of influenza from Nasopharyngeal swab specimens and should not be used as a sole basis for treatment. Nasal washings and aspirates are unacceptable for Xpert Xpress SARS-CoV-2/FLU/RSV testing.  Fact Sheet for Patients: EntrepreneurPulse.com.au  Fact Sheet for Healthcare Providers: IncredibleEmployment.be  This test is not yet approved or cleared by the [place] FDA and has been authorized for detection and/or diagnosis of SARS-CoV-2 by FDA under an Emergency Use Authorization (EUA). This EUA will remain in effect (meaning this test can be used) for the duration of the COVID-19 declaration under Section 564(b)(1) of the Act, 21 U.S.C. section 360bbb-3(b)(1), unless the authorization is terminated or revoked.  Performed at [facility], [place] [address], [place], [place] [address]      Radiological Exams on Admission: DG Chest 2 View  Result Date: 08/18/2020 CLINICAL DATA:  Chest pain.  Abdominal pain. EXAM: CHEST - 2 VIEW COMPARISON:  04/25/2020. FINDINGS: Prior CABG. Stable cardiomegaly. No pulmonary venous congestion. No focal infiltrate. No pleural effusion or pneumothorax. Degenerative changes scoliosis thoracic spine. IMPRESSION: 1. Prior CABG. Stable cardiomegaly. 2. No acute pulmonary disease. Electronically Signed   By: Marcello Moores  Register   On: 08/18/2020 10:55   CT ABDOMEN PELVIS W CONTRAST  Result Date: 08/19/2020 CLINICAL DATA:  Right-sided abdominal pain, polyuria  EXAM: CT ABDOMEN AND PELVIS WITH CONTRAST TECHNIQUE: Multidetector CT imaging of the abdomen and pelvis was performed using the standard protocol following bolus administration of intravenous contrast. CONTRAST:  134m OMNIPAQUE IOHEXOL 300 MG/ML  SOLN COMPARISON:  02/24/2009 FINDINGS: Lower chest: No acute pleural or parenchymal lung disease. Hepatobiliary: No focal liver abnormality is seen. No gallstones, gallbladder wall thickening, or biliary dilatation. Pancreas: Unremarkable. No pancreatic ductal dilatation or surrounding inflammatory changes. Spleen: Normal in size without focal abnormality. Adrenals/Urinary Tract: Multiple areas of left renal cortical scarring. Right kidney enhances normally. No urinary tract calculi or obstructive uropathy. The adrenals are normal. Bladder is decompressed, which limits its evaluation. Stomach/Bowel: No bowel obstruction or ileus. No bowel wall thickening or inflammatory change. Vascular/Lymphatic: Diffuse atherosclerosis of the aorta. 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 No dissection. No pathologic adenopathy. Reproductive: Uterus is atrophic. Calcified fibroid within the uterine fundus. No adnexal masses. Other: No free fluid or free intraperitoneal gas. No abdominal wall  hernia. Musculoskeletal: No acute or destructive bony lesions. Reconstructed images demonstrate no additional findings. IMPRESSION: 1. No acute intra-abdominal or intrapelvic process. 2. 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 Recommend follow-up ultrasound every 5 years. This recommendation follows ACR consensus guidelines: White Paper of the ACR Incidental Findings Committee II on Vascular Findings. J Am Coll Radiol 2013;; 20:802-233 3. Aortic Atherosclerosis (ICD10-I70.0). Electronically Signed   By: MRanda NgoM.D.   On: 08/19/2020 00:57    EKG: Independently reviewed.  Normal sinus rhythm with LVH changes.  Assessment/Plan Principal Problem:   Atrial fibrillation with RVR (HCC) Active Problems:   Essential hypertension   CAD, ARTERY BYPASS GRAFT    1. A. fib with RVR -presently on Cardizem drip.  Patient is not on any rate limiting medications.  Will consult cardiology.  Check TSH cardiac markers.  Patient is on apixaban which we will continue. 2. Abdominal pain presently resolved.  CT abdomen pelvis does not show anything acute.  Continue to follow. 3. Focal dilation of infrarenal abdominal aorta will need follow-up. 4. History of CAD status post CABG denies any chest pain.  On statins aspirin and Eliquis.  Trend cardiac markers. 5. History of stroke on Eliquis and statins. 6. Anemia follow CBC.   DVT prophylaxis: Apixaban. Code Status: Full code. Family Communication: Discussed with patient. Disposition Plan: Home. Consults called: Cardiology. Admission status: Observation.   ARise PatienceMD [facility] Pager [phone]  If 7PM-7AM, please contact night-coverage [email or web address] Password [facility] 08/19/2020, 5:37 AM

## 2020-08-19 NOTE — Care Management (Signed)
[id number] 08-19-20 Patient is on Eliquis- she has Medicaid and cost should be no more than $3.00. Case Manager will follow for cost. Graves-Bigelow, Lamar Laundry, RN,BSN Case Manager

## 2020-08-19 NOTE — Chronic Care Management (AMB) (Signed)
  Chronic Care Management   Inpatient Admit Review Note  08/19/2020 Name: Kimberly [name] MRN: [id number] DOB: Apr 25, 1956  [name] is a 65 y.o. year old female who is a primary care patient of [name], FNP. [name] is actively engaged with the embedded care management team in the primary care practice and is being followed by RN Case Manager BSW for assistance with disease management and care coordination needs related to Atrial Fibrillation and HTN.   [name] is currently admitted to the hospital for evaluation and treatment of Atrial Fibrillation.   Plan: CM team will collaborate with RN Care Manager and will follow patient post discharge.    Bevelyn Ngo, BSW, CDP Social Worker, Certified Dementia Practitioner [facility] Care Management [phone]

## 2020-08-19 NOTE — Progress Notes (Signed)
  Echocardiogram 2D Echocardiogram has been performed.  Delcie Roch 08/19/2020, 4:04 PM

## 2020-08-19 NOTE — Progress Notes (Signed)
Kimberly [name] is a 65 y.o. female with history of CAD status post CABG, A. fib, stroke with left-sided weakness and aneurysm presents to the ER with complaint of abdominal pain.  Pain is mostly in the right lower quadrant had 2 episode of nausea vomiting yesterday morning.  Pain has been persistent and has no diarrhea.  Denies chest pain or shortness of breath.  ED Course: In the ER CT abdomen pelvis does not show any acute.  After giving pain relief medication patient's abdominal pain resolved.  But patient was in A. fib with RVR for which patient was started on Cardizem drip after patient refused cardioversion.  Labs are largely unremarkable except for anemia with hemoglobin of 11.7.  Patient admitted for further management of A. fib with RVR.  Covid test was negative.  08/19/20: She was seen and examined at her bedside.  She is currently off Cardizem drip.  She is in A. fib however her rate is controlled.  She was started on p.o. Lopressor 25 mg twice daily and Eliquis for secondary CVA prevention by cardiology.  TSH was normal 0.897.  Her 2D echo was completed, results are pending.  Please refer to H&P dictated by my partner Dr. Toniann [name] on 08/19/2020 for further details of the assessment and plan.

## 2020-08-19 NOTE — Progress Notes (Signed)
CRITICAL VALUE ALERT  Critical Value:  Troponin 124  Date & Time Notified:  08/19/2020 @ 1033  Provider Notified: Dr. Margo Aye   Orders Received/Actions taken: STAT EKG, STAT troponin and serial troponins, 2D ECHO, cardiology consulted

## 2020-08-20 DIAGNOSIS — I4891 Unspecified atrial fibrillation: Secondary | ICD-10-CM | POA: Diagnosis not present

## 2020-08-20 DIAGNOSIS — I6523 Occlusion and stenosis of bilateral carotid arteries: Secondary | ICD-10-CM

## 2020-08-20 DIAGNOSIS — I10 Essential (primary) hypertension: Secondary | ICD-10-CM | POA: Diagnosis not present

## 2020-08-20 DIAGNOSIS — I25810 Atherosclerosis of coronary artery bypass graft(s) without angina pectoris: Secondary | ICD-10-CM

## 2020-08-20 DIAGNOSIS — I480 Paroxysmal atrial fibrillation: Secondary | ICD-10-CM | POA: Diagnosis not present

## 2020-08-20 LAB — BASIC METABOLIC PANEL
Anion gap: 8 (ref 5–15)
BUN: 20 mg/dL (ref 8–23)
CO2: 23 mmol/L (ref 22–32)
Calcium: 8.9 mg/dL (ref 8.9–10.3)
Chloride: 107 mmol/L (ref 98–111)
Creatinine, Ser: 1.06 mg/dL — ABNORMAL HIGH (ref 0.44–1.00)
GFR, Estimated: 59 mL/min — ABNORMAL LOW (ref >60)
Glucose, Bld: 79 mg/dL (ref 70–99)
Potassium: 4.4 mmol/L (ref 3.5–5.1)
Sodium: 138 mmol/L (ref 135–145)

## 2020-08-20 LAB — MAGNESIUM: Magnesium: 2.4 mg/dL (ref 1.7–2.4)

## 2020-08-20 MED ORDER — AMLODIPINE BESYLATE 5 MG PO TABS
7.5000 mg | ORAL_TABLET | Freq: Every day | ORAL | Status: DC
  Administered 2020-08-20: 7.5 mg via ORAL
  Filled 2020-08-20 (×2): qty 2

## 2020-08-20 MED ORDER — POLYETHYLENE GLYCOL 3350 17 G PO PACK
17.0000 g | PACK | Freq: Every day | ORAL | Status: DC
  Administered 2020-08-20 – 2020-08-22 (×3): 17 g via ORAL
  Filled 2020-08-20 (×3): qty 1

## 2020-08-20 MED ORDER — MAGNESIUM SULFATE 2 GM/50ML IV SOLN
2.0000 g | Freq: Once | INTRAVENOUS | Status: AC
  Administered 2020-08-20: 2 g via INTRAVENOUS
  Filled 2020-08-20: qty 50

## 2020-08-20 MED ORDER — BISACODYL 10 MG RE SUPP: 10.0000 mg | Freq: Once | RECTAL | Status: DC

## 2020-08-20 MED ORDER — POTASSIUM CHLORIDE CRYS ER 20 MEQ PO TBCR
40.0000 meq | EXTENDED_RELEASE_TABLET | Freq: Once | ORAL | Status: AC
  Administered 2020-08-20: 40 meq via ORAL
  Filled 2020-08-20: qty 2

## 2020-08-20 NOTE — Progress Notes (Addendum)
Progress Note  Patient Name: Kimberly [name] Date of Encounter: 08/20/2020  [facility] Cardiologist: [name], MD   Subjective   No acute overnight events. Patient converted to sinus rhythm overnight. She still reports abdominal pain this morning which seemed to worsen when I had her take a deep breath. No recurrent chest pain. No shortness of breath.   Inpatient Medications    Scheduled Meds: . apixaban  5 mg Oral BID  . aspirin  81 mg Oral Daily  . atorvastatin  80 mg Oral Daily  . citalopram  10 mg Oral Daily  . metoprolol tartrate  25 mg Oral BID  . pantoprazole  40 mg Oral Daily  . senna-docusate  2 tablet Oral BID   Continuous Infusions:  PRN Meds: acetaminophen **OR** acetaminophen, oxyCODONE, traMADol   Vital Signs    Vitals:   08/19/20 1927 08/19/20 2322 08/20/20 0306 08/20/20 0811  BP: 107/80 106/78 122/85 (!) 147/99  Pulse: 80 64 (!) 55   Resp: 16 17 15 20   Temp: 98.5 F (36.9 C) 98.6 F (37 C) 98.7 F (37.1 C) 97.7 F (36.5 C)  TempSrc: Oral Oral Oral Oral  SpO2: 96% 96% 100% 96%  Weight:      Height:        Intake/Output Summary (Last 24 hours) at 08/20/2020 0900 Last data filed at 08/19/2020 1900 Gross per 24 hour  Intake 1044.71 ml  Output --  Net 1044.71 ml   Last 3 Weights 08/18/2020 07/28/2020 05/17/2020  Weight (lbs) 113 lb 114 lb 9.6 oz (No Data)  Weight (kg) 51.256 kg 51.982 kg (No Data)      Telemetry    Converted to sinus rhythm with last night. Rates currently in the 60's. - Personally Reviewed  ECG    No new ECG tracing today.  - Personally Reviewed  Physical Exam   GEN: No acute distress.   Neck: Supple. Cardiac: RRR. No murmurs, rubs, or gallops.  Respiratory: Clear to auscultation bilaterally. GI: Soft and non-distended but still tender to palpation on the right side. Bowel sounds present. MS: No lower extremity edema. No deformity. Skin: Warm and dry. Neuro:  No focal deficits. Psych: Normal affect.  Responds appropriately.  Labs    High Sensitivity Troponin:   Recent Labs  Lab 08/18/20 1027 08/19/20 0911 08/19/20 1118 08/19/20 1326  TROPONINIHS 8 124* 149* 153*      Chemistry Recent Labs  Lab 08/18/20 1027 08/19/20 0911  NA 142 141  K 3.5 3.2*  CL 107 107  CO2 25 24  GLUCOSE 77 102*  BUN 10 18  CREATININE 0.86 1.00  CALCIUM 9.9 9.1  PROT 7.8 6.3*  ALBUMIN 4.4 3.5  AST 27 23  ALT 30 26  ALKPHOS 124 110  BILITOT 0.7 0.6  GFRNONAA >60 >60  ANIONGAP 10 10     Hematology Recent Labs  Lab 08/18/20 1027 08/19/20 0911  WBC 7.2 6.2  RBC 5.41* 5.08  HGB 11.7* 11.1*  HCT 39.0 36.2  MCV 72.1* 71.3*  MCH 21.6* 21.9*  MCHC 30.0 30.7  RDW 17.7* 17.3*  PLT 229 214    BNPNo results for input(s): BNP, PROBNP in the last 168 hours.   DDimer No results for input(s): DDIMER in the last 168 hours.   Radiology    DG Chest 2 View  Result Date: 08/18/2020 CLINICAL DATA:  Chest pain.  Abdominal pain. EXAM: CHEST - 2 VIEW COMPARISON:  04/25/2020. FINDINGS: Prior CABG. Stable cardiomegaly. No pulmonary  venous congestion. No focal infiltrate. No pleural effusion or pneumothorax. Degenerative changes scoliosis thoracic spine. IMPRESSION: 1. Prior CABG. Stable cardiomegaly. 2. No acute pulmonary disease. Electronically Signed   By: Maisie Fus  Register   On: 08/18/2020 10:55   CT ABDOMEN PELVIS W CONTRAST  Result Date: 08/19/2020 CLINICAL DATA:  Right-sided abdominal pain, polyuria EXAM: CT ABDOMEN AND PELVIS WITH CONTRAST TECHNIQUE: Multidetector CT imaging of the abdomen and pelvis was performed using the standard protocol following bolus administration of intravenous contrast. CONTRAST:  OMNIPAQUE IOHEXOL 300 MG/ML  SOLN COMPARISON:  02/24/2009 FINDINGS: Lower chest: No acute pleural or parenchymal lung disease. Hepatobiliary: No focal liver abnormality is seen. No gallstones, gallbladder wall thickening, or biliary dilatation. Pancreas: Unremarkable. No pancreatic ductal  dilatation or surrounding inflammatory changes. Spleen: Normal in size without focal abnormality. Adrenals/Urinary Tract: Multiple areas of left renal cortical scarring. Right kidney enhances normally. No urinary tract calculi or obstructive uropathy. The adrenals are normal. Bladder is decompressed, which limits its evaluation. Stomach/Bowel: No bowel obstruction or ileus. No bowel wall thickening or inflammatory change. Vascular/Lymphatic: Diffuse atherosclerosis of the aorta. 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 No dissection. No pathologic adenopathy. Reproductive: Uterus is atrophic. Calcified fibroid within the uterine fundus. No adnexal masses. Other: No free fluid or free intraperitoneal gas. No abdominal wall hernia. Musculoskeletal: No acute or destructive bony lesions. Reconstructed images demonstrate no additional findings. IMPRESSION: 1. No acute intra-abdominal or intrapelvic process. 2. 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 Recommend follow-up ultrasound every 5 years. This recommendation follows ACR consensus guidelines: White Paper of the ACR Incidental Findings Committee II on Vascular Findings. J Am Coll Radiol 2013; 62:863-817. 3. Aortic Atherosclerosis (ICD10-I70.0). Electronically Signed   By: Sharlet Salina M.D.   On: 08/19/2020 00:57   ECHOCARDIOGRAM COMPLETE  Result Date: 08/19/2020    ECHOCARDIOGRAM REPORT   Patient Name:   Kimberly [name] Date of Exam: 08/19/2020 Medical Rec #:  [id number]         Height:       67.0 in Accession #:    [id number]        Weight:       113.0 lb Date of Birth:  1956/04/25        BSA:          1.587 m Patient Age:    64 years          BP:           113/77 mmHg Patient Gender: F                 HR:           76 bpm. Exam Location:  Inpatient Procedure: 2D Echo Indications:    elevated troponin  History:        Patient has prior history of Echocardiogram examinations, most                  recent 02/14/2018. CAD, Arrythmias:Atrial Fibrillation; Risk                 Factors:Hypertension.  Sonographer:    Delcie Roch Referring Phys: [id number] CAROLE N HALL IMPRESSIONS  1. Left ventricular ejection fraction, by estimation, is 65 to 70%. The left ventricle has normal function. The left ventricle has no regional wall motion abnormalities. There is mild concentric left ventricular hypertrophy. Left ventricular diastolic function could not be evaluated.  2. Right ventricular systolic  function is normal. The right ventricular size is normal. There is normal pulmonary artery systolic pressure. The estimated right ventricular systolic pressure is 35.4 mmHg.  3. Left atrial size was moderately dilated.  4. Right atrial size was mildly dilated.  5. The mitral valve is normal in structure. Mild mitral valve regurgitation. No evidence of mitral stenosis.  6. Tricuspid valve regurgitation is mild to moderate.  7. The aortic valve is normal in structure. Aortic valve regurgitation is not visualized. No aortic stenosis is present.  8. Aortic dilatation noted. There is mild dilatation of the ascending aorta, measuring 42 mm.  9. The inferior vena cava is normal in size with greater than 50% respiratory variability, suggesting right atrial pressure of 3 mmHg. FINDINGS  Left Ventricle: Left ventricular ejection fraction, by estimation, is 65 to 70%. The left ventricle has normal function. The left ventricle has no regional wall motion abnormalities. The left ventricular internal cavity size was normal in size. There is  mild concentric left ventricular hypertrophy. Left ventricular diastolic function could not be evaluated due to atrial fibrillation. Left ventricular diastolic function could not be evaluated. Right Ventricle: The right ventricular size is normal. No increase in right ventricular wall thickness. Right ventricular systolic function is normal. There is normal pulmonary artery systolic pressure. The  tricuspid regurgitant velocity is 1.91 m/s, and  with an assumed right atrial pressure of 3 mmHg, the estimated right ventricular systolic pressure is 56.2 mmHg. Left Atrium: Left atrial size was moderately dilated. Right Atrium: Right atrial size was mildly dilated. Pericardium: There is no evidence of pericardial effusion. Mitral Valve: The mitral valve is normal in structure. There is mild thickening of the mitral valve leaflet(s). Mild mitral valve regurgitation. No evidence of mitral valve stenosis. Tricuspid Valve: The tricuspid valve is normal in structure. Tricuspid valve regurgitation is mild to moderate. No evidence of tricuspid stenosis. Aortic Valve: The aortic valve is normal in structure. Aortic valve regurgitation is not visualized. No aortic stenosis is present. Pulmonic Valve: The pulmonic valve was normal in structure. Pulmonic valve regurgitation is not visualized. No evidence of pulmonic stenosis. Aorta: Aortic dilatation noted. There is mild dilatation of the ascending aorta, measuring 42 mm. Venous: The inferior vena cava is normal in size with greater than 50% respiratory variability, suggesting right atrial pressure of 3 mmHg. IAS/Shunts: No atrial level shunt detected by color flow Doppler.  LEFT VENTRICLE PLAX 2D LVIDd:         4.80 cm LVIDs:         3.70 cm LV PW:         0.80 cm LV IVS:        1.10 cm LVOT diam:     2.00 cm LV SV:         53 LV SV Index:   34 LVOT Area:     3.14 cm  RIGHT VENTRICLE         IVC TAPSE (M-mode): 1.6 cm  IVC diam: 1.80 cm LEFT ATRIUM             Index       RIGHT ATRIUM           Index LA diam:        3.80 cm 2.40 cm/m  RA Area:     18.50 cm LA Vol (A2C):   96.6 ml 60.89 ml/m RA Volume:   50.70 ml  31.96 ml/m LA Vol (A4C):   82.3 ml 51.87 ml/m LA Biplane Vol:  89.3 ml 56.29 ml/m  AORTIC VALVE LVOT Vmax:   94.00 cm/s LVOT Vmean:  56.400 cm/s LVOT VTI:    0.170 m  AORTA Ao Root diam: 3.20 cm Ao Asc diam:  4.20 cm TRICUSPID VALVE TV Peak grad:   19.5 mmHg  TV Vmax:        2.21 m/s TR Peak grad:   14.6 mmHg TR Vmax:        191.00 cm/s  SHUNTS Systemic VTI:  0.17 m Systemic Diam: 2.00 cm Ena Dawley MD Electronically signed by Ena Dawley MD Signature Date/Time: 08/19/2020/7:49:19 PM    Final     Cardiac Studies   Echocardiogram 08/19/2020: 1. Left ventricular ejection fraction, by estimation, is 65 to 70%. The  left ventricle has normal function. The left ventricle has no regional  wall motion abnormalities. There is mild concentric left ventricular  hypertrophy. Left ventricular diastolic  function could not be evaluated.  2. Right ventricular systolic function is normal. The right ventricular  size is normal. There is normal pulmonary artery systolic pressure. The  estimated right ventricular systolic pressure is [id number] mmHg.  3. Left atrial size was moderately dilated.  4. Right atrial size was mildly dilated.  5. The mitral valve is normal in structure. Mild mitral valve  regurgitation. No evidence of mitral stenosis.  6. Tricuspid valve regurgitation is mild to moderate.  7. The aortic valve is normal in structure. Aortic valve regurgitation is  not visualized. No aortic stenosis is present.  8. Aortic dilatation noted. There is mild dilatation of the ascending  aorta, measuring 42 mm.  9. The inferior vena cava is normal in size with greater than 50%  respiratory variability, suggesting right atrial pressure of 3 mmHg.   Patient Profile     65 y.o. female with a history of CAD s/p CABG in 2008, paroxysmal atrial fibrillation on Eliquis, chronic dyspnea, occluded internal carotid arteries bilaterally, stroke in 2019 with residual life sided weakness, hypertension, hyperlipidemia, and long smoking history who is being seen today for the evaluation of atrial fibrillation at the request of Dr. Nevada Crane.  Assessment & Plan    Paroxysmal Atrial Fibrillation with RVR - Patient presented with abdominal pain and vomiting. Symptoms  resolved but then she went into atrial fibrillation with RVR in the ED and was started on IV Cardizem. She does have a history of atrial fibrillation.  - Patient converted to sinus rhythm overnight. Rates in the 60's. - Potassium 3.2 yesterday. Repleted and 4.4 today. - Magnesium 1.6 yesterday. Repleted and 2.4 today. - TSH normal.  - Echo showed LVEF of 65-70% with normal wall motion and mild LVH.  - Continue Lopressor 25mg  twice daily. - CHA2DS2-VASC = 5 (CAD, HTN, stroke x2, female). Continue Eliquis 5mg  twice daily.   Chest Pain Elevated Troponin  History of CAD s/p CABG in 2008 - She did have one episode of chest pain with radiation to left shoulder with her GI symptoms on day of presentation that she has difficulty describing. Pain resolved on its own. No recurrent chest pain. Last Myoview in 03/2020 of this year showed a large partially reversible lateral wall perfusion defect consistent with infarct with a significant region of peri-infarct ischemia. - EKG shows T wave inversions in anterolateral leads as well as lead II but these are not new. - Initial high-sensitivity troponin negative but repeat 124 >> 149. Not consistent with ACS. - Echo shows normal EF and normal wall motion. - Continue aspirin and high-intensity statin. -  Possible demand ischemia secondary to atrial fibrillation with RVR with underlying CAD. No additional ischemic work-up planned.  Carotid Artery Disease - Known bilaterally occluded ICAs. - Being managed medically. Continue aspirin and high-intensity statin.    Ascending Aortic Aneurysm  - Chest CT in 04/2020 showed stable ascending aortic aneurysm of 4.3cm compared to prior imaging in 02/2018. Annual imaging recommended.  - Echo this admission showed mild dilatation of ascending aorta measuring 42 mm.  - Continue to follow as outpatient.  Dilatation of Infrarenal Abdominal Aorta - CT this admission showed focal dilation of infrarenal abdominal aorta  measuring up to 2.8 cm.  - Follow-up ultrasound recommended in 5 years.  Hypertension - BP mildly this morning. - Continue Lopressor 25mg  twice daily. - Restart home Amlodipine 7.5mg  daily.  Hyperlipidemia - Continue home Lipitor 80mg  daily.  Chronic Anemia - Hemoglobin stable at 11.1 which is around baseline.  Abdominal Pain - Patient continues to have right sided abdominal pain. - Abdominal/pelvic CT unremarkable. - Lipase and LFTS normal. - Management per primary team.  For questions or updates, please contact [place] Please consult [email or web address] for contact info under        Signed, Darreld Mclean, PA-C  08/20/2020, 9:00 AM    I have seen and examined the patient along with Darreld Mclean, PA-C .  I have reviewed the chart, notes and new data.  I agree with PA's note.  Key new complaints: no palpitations, chest pain or dyspnea Key examination changes: RRR Key new findings / data: telemetry shows back in NSR since 2100h  PLAN: [facility] will sign off.   Medication Recommendations:  Continue current doses of metoprolol, amlodipine, atorvastatin and Eliquis Other recommendations (labs, testing, etc):  n/a Follow up as an outpatient:  Has appt scheduled for January 31 w Cardiology   Sanda Klein, MD, [place] [phone] 08/20/2020, 10:22 AM

## 2020-08-20 NOTE — Progress Notes (Signed)
PROGRESS NOTE  Kimberly [name] [id number] DOB: June 20, 1956 DOA: 08/18/2020 PCP: [name], FNP  HPI/Recap of past 24 hours: Kimberly Beecham Goinsis a 65 y.o.femalewithhistory of CAD status post CABG, A. fib, stroke with left-sided weakness and aneurysm presents to the ER with complaint of abdominal pain. Pain is mostly in the right lower quadrant had 2 episode of nausea vomiting yesterday morning. Pain has been persistent and has no diarrhea. Denies chest pain or shortness of breath.  ED Course:In the ER CT abdomen pelvis does not show any acute. After giving pain relief medication patient's abdominal pain resolved. But patient was in A. fib with RVR for which patient was started on Cardizem drip after patient refused cardioversion. Labs are largely unremarkable except for anemia with hemoglobin of 11.7. Patient admitted for further management of A. fib with RVR. Covid test was negative.  Currently off Cardizem drip.  She is in A. fib however her rate is controlled.  on 08/20/20 She was started on p.o. Lopressor 25 mg twice daily and Eliquis for secondary CVA prevention by cardiology.  TSH was normal 0.897.  Her 2D echo was completed.  08/20/20: Patient was seen and examined at bedside this morning.  She reports constipation for 3 days and thinks that her abdomen hurts because of it.  Given laxative, will see if it improves her symptomatology.  CT abdomen and pelvis was unremarkable.  LFTs and lipase level have been unremarkable as well.   Assessment/Plan: Principal Problem:   Atrial fibrillation with RVR (HCC) Active Problems:   Essential hypertension   CAD, ARTERY BYPASS GRAFT   Diffuse abdominal pain, unclear etiology Initially was associated with nausea and vomiting Nausea and vomiting have resolved. CT abdomen and pelvis was unremarkable. LFTs and lipase level have been unremarkable Reported constipation for 3 days Laxatives ordered, if no improvement will consult  GI  Paroxysmal A. fib with RVR, rate is now controlled Initially requiring diltiazem drip Currently on Lopressor 25 mg twice daily On Eliquis for secondary CVA prevention Appreciate cardiology's assistance  Elevated troponin likely demand ischemia in the setting of A. fib with RVR Seen by cardiology 2D echo completed, results are pending  Ascending aortic aneurysm Currently measuring 4.2 cm Continue monitoring as outpatient Annual imaging is recommended.  Dilatation of infrarenal abdominal aorta CT abdomen and pelvis showed focal dilatation of the infrarenal abdominal aorta measuring up to 2.8 cm Follow-up ultrasound recommended in 5 years.  Essential hypertension BP is currently at goal Continue Lopressor 25 mg twice daily and amlodipine 7.5 mg daily.  Hyperlipidemia Continue Lipitor 80 mg daily  Chronic anxiety/depression Continue home Celexa  GERD Continue Protonix 40 mg daily.  Tobacco use disorder Tobacco cessation counseling done at bedside.   Code Status: Full code  Family Communication: None at bedside.  Disposition Plan: Pending PT evaluation.   Consultants:  Cardiology, signed off on 08/20/2020.  Procedures:  2D echo  Antimicrobials:  None.  DVT prophylaxis: Eliquis.  Status is: Observation    Dispo:  Patient From: Home  Planned Disposition: Home with Health Care Svc  Expected discharge date: 08/22/2020  Medically stable for discharge: No, ongoing management of abdominal pain of unclear etiology.    Objective: Vitals:   08/19/20 2322 08/20/20 0306 08/20/20 0811 08/20/20 1105  BP: 106/78 122/85 (!) 147/99 122/79  Pulse: 64 (!) 55  64  Resp: 17 15 20 18   Temp: 98.6 F (37 C) 98.7 F (37.1 C) 97.7 F (36.5 C) 98.4 F (36.9 C)  TempSrc: Oral Oral Oral Oral  SpO2: 96% 100% 96% 97%  Weight:      Height:        Intake/Output Summary (Last 24 hours) at 08/20/2020 1609 Last data filed at 08/20/2020 1500 Gross per 24 hour  Intake 480  ml  Output 300 ml  Net 180 ml   Filed Weights   08/18/20 2300  Weight: 51.3 kg    Exam:  . General: 65 y.o. year-old female well developed well nourished in no acute distress.  Alert and oriented x3. . Cardiovascular: Regular rate and rhythm with no rubs or gallops.  No thyromegaly or JVD noted.   [name] Respiratory: Clear to auscultation with no wheezes or rales. Good inspiratory effort. . Abdomen: Diffuse abdominal pain with mild palpation.  Bowel sounds present.  Nondistended.   . Musculoskeletal: No lower extremity edema. 2/4 pulses in all 4 extremities. . Skin: No ulcerative lesions noted or rashes . Psychiatry: Mood is appropriate for condition and setting   Data Reviewed: CBC: Recent Labs  Lab 08/18/20 1027 08/19/20 0911  WBC 7.2 6.2  NEUTROABS  --  3.3  HGB 11.7* 11.1*  HCT 39.0 36.2  MCV 72.1* 71.3*  PLT 229 761   Basic Metabolic Panel: Recent Labs  Lab 08/18/20 1027 08/19/20 0911 08/19/20 1326 08/20/20 0811  NA 142 141  --  138  K 3.5 3.2*  --  4.4  CL 107 107  --  107  CO2 25 24  --  23  GLUCOSE 77 102*  --  79  BUN 10 18  --  20  CREATININE 0.86 1.00  --  1.06*  CALCIUM 9.9 9.1  --  8.9  MG  --   --  1.6* 2.4   GFR: Estimated Creatinine Clearance: 43.4 mL/min (A) (by C-G formula based on SCr of 1.06 mg/dL (H)). Liver Function Tests: Recent Labs  Lab 08/18/20 1027 08/19/20 0911  AST 27 23  ALT 30 26  ALKPHOS 124 110  BILITOT 0.7 0.6  PROT 7.8 6.3*  ALBUMIN 4.4 3.5   Recent Labs  Lab 08/18/20 1027  LIPASE 18   No results for input(s): AMMONIA in the last 168 hours. Coagulation Profile: No results for input(s): INR, PROTIME in the last 168 hours. Cardiac Enzymes: No results for input(s): CKTOTAL, CKMB, CKMBINDEX, TROPONINI in the last 168 hours. BNP (last 3 results) No results for input(s): PROBNP in the last 8760 hours. HbA1C: No results for input(s): HGBA1C in the last 72 hours. CBG: No results for input(s): GLUCAP in the last 168  hours. Lipid Profile: No results for input(s): CHOL, HDL, LDLCALC, TRIG, CHOLHDL, LDLDIRECT in the last 72 hours. Thyroid Function Tests: Recent Labs    08/19/20 0911  TSH 0.897   Anemia Panel: No results for input(s): VITAMINB12, FOLATE, FERRITIN, TIBC, IRON, RETICCTPCT in the last 72 hours. Urine analysis:    Component Value Date/Time   COLORURINE YELLOW 02/13/2018 1209   APPEARANCEUR CLEAR 02/13/2018 1209   LABSPEC 1.014 02/13/2018 1209   PHURINE 5.0 02/13/2018 1209   GLUCOSEU NEGATIVE 02/13/2018 1209   HGBUR SMALL (A) 02/13/2018 1209   BILIRUBINUR NEGATIVE 02/13/2018 1209   KETONESUR 5 (A) 02/13/2018 1209   PROTEINUR 100 (A) 02/13/2018 1209   UROBILINOGEN 0.2 05/26/2011 2243   NITRITE NEGATIVE 02/13/2018 1209   LEUKOCYTESUR NEGATIVE 02/13/2018 1209   Sepsis Labs: @LABRCNTIP (procalcitonin:4,lacticidven:4)  ) Recent Results (from the past 240 hour(s))  Resp Panel by RT-PCR (Flu A&B, Covid) Nasopharyngeal Swab  Status: None   Collection Time: 08/19/20  3:54 AM   Specimen: Nasopharyngeal Swab; Nasopharyngeal(NP) swabs in vial transport medium  Result Value Ref Range Status   SARS Coronavirus 2 by RT PCR NEGATIVE NEGATIVE Final    Comment: (NOTE) SARS-CoV-2 target nucleic acids are NOT DETECTED.  The SARS-CoV-2 RNA is generally detectable in upper respiratory specimens during the acute phase of infection. The lowest concentration of SARS-CoV-2 viral copies this assay can detect is 138 copies/mL. A negative result does not preclude SARS-Cov-2 infection and should not be used as the sole basis for treatment or other patient management decisions. A negative result may occur with  improper specimen collection/handling, submission of specimen other than nasopharyngeal swab, presence of viral mutation(s) within the areas targeted by this assay, and inadequate number of viral copies(<138 copies/mL). A negative result must be combined with clinical observations, patient  history, and epidemiological information. The expected result is Negative.  Fact Sheet for Patients:  EntrepreneurPulse.com.au  Fact Sheet for Healthcare Providers:  IncredibleEmployment.be  This test is no t yet approved or cleared by the [place] FDA and  has been authorized for detection and/or diagnosis of SARS-CoV-2 by FDA under an Emergency Use Authorization (EUA). This EUA will remain  in effect (meaning this test can be used) for the duration of the COVID-19 declaration under Section 564(b)(1) of the Act, 21 U.S.C.section 360bbb-3(b)(1), unless the authorization is terminated  or revoked sooner.       Influenza A by PCR NEGATIVE NEGATIVE Final   Influenza B by PCR NEGATIVE NEGATIVE Final    Comment: (NOTE) The Xpert Xpress SARS-CoV-2/FLU/RSV plus assay is intended as an aid in the diagnosis of influenza from Nasopharyngeal swab specimens and should not be used as a sole basis for treatment. Nasal washings and aspirates are unacceptable for Xpert Xpress SARS-CoV-2/FLU/RSV testing.  Fact Sheet for Patients: EntrepreneurPulse.com.au  Fact Sheet for Healthcare Providers: IncredibleEmployment.be  This test is not yet approved or cleared by the [place] FDA and has been authorized for detection and/or diagnosis of SARS-CoV-2 by FDA under an Emergency Use Authorization (EUA). This EUA will remain in effect (meaning this test can be used) for the duration of the COVID-19 declaration under Section 564(b)(1) of the Act, 21 U.S.C. section 360bbb-3(b)(1), unless the authorization is terminated or revoked.  Performed at [facility], [place] [address], [place], [place] [address]       Studies: No results found.  Scheduled Meds: . amLODipine  7.5 mg Oral Daily  . apixaban  5 mg Oral BID  . aspirin  81 mg Oral Daily  . atorvastatin  80 mg Oral Daily  . citalopram  10 mg Oral Daily  .  metoprolol tartrate  25 mg Oral BID  . pantoprazole  40 mg Oral Daily  . senna-docusate  2 tablet Oral BID    Continuous Infusions:   LOS: 0 days     Kayleen Memos, MD [facility] Pager [phone]  If 7PM-7AM, please contact night-coverage [email or web address] Password [facility] 08/20/2020, 4:09 PM

## 2020-08-21 DIAGNOSIS — K5900 Constipation, unspecified: Secondary | ICD-10-CM | POA: Diagnosis present

## 2020-08-21 DIAGNOSIS — I2510 Atherosclerotic heart disease of native coronary artery without angina pectoris: Secondary | ICD-10-CM | POA: Diagnosis present

## 2020-08-21 DIAGNOSIS — R5381 Other malaise: Secondary | ICD-10-CM | POA: Diagnosis present

## 2020-08-21 DIAGNOSIS — Z5329 Procedure and treatment not carried out because of patient's decision for other reasons: Secondary | ICD-10-CM | POA: Diagnosis not present

## 2020-08-21 DIAGNOSIS — I712 Thoracic aortic aneurysm, without rupture: Secondary | ICD-10-CM | POA: Diagnosis present

## 2020-08-21 DIAGNOSIS — Z888 Allergy status to other drugs, medicaments and biological substances status: Secondary | ICD-10-CM | POA: Diagnosis not present

## 2020-08-21 DIAGNOSIS — D509 Iron deficiency anemia, unspecified: Secondary | ICD-10-CM | POA: Diagnosis present

## 2020-08-21 DIAGNOSIS — Z951 Presence of aortocoronary bypass graft: Secondary | ICD-10-CM | POA: Diagnosis not present

## 2020-08-21 DIAGNOSIS — K589 Irritable bowel syndrome without diarrhea: Secondary | ICD-10-CM | POA: Diagnosis present

## 2020-08-21 DIAGNOSIS — Z7901 Long term (current) use of anticoagulants: Secondary | ICD-10-CM | POA: Diagnosis not present

## 2020-08-21 DIAGNOSIS — I10 Essential (primary) hypertension: Secondary | ICD-10-CM | POA: Diagnosis present

## 2020-08-21 DIAGNOSIS — R109 Unspecified abdominal pain: Secondary | ICD-10-CM | POA: Diagnosis not present

## 2020-08-21 DIAGNOSIS — E785 Hyperlipidemia, unspecified: Secondary | ICD-10-CM | POA: Diagnosis present

## 2020-08-21 DIAGNOSIS — I248 Other forms of acute ischemic heart disease: Secondary | ICD-10-CM | POA: Diagnosis present

## 2020-08-21 DIAGNOSIS — F17210 Nicotine dependence, cigarettes, uncomplicated: Secondary | ICD-10-CM | POA: Diagnosis present

## 2020-08-21 DIAGNOSIS — M109 Gout, unspecified: Secondary | ICD-10-CM | POA: Diagnosis present

## 2020-08-21 DIAGNOSIS — Z20822 Contact with and (suspected) exposure to covid-19: Secondary | ICD-10-CM | POA: Diagnosis present

## 2020-08-21 DIAGNOSIS — F419 Anxiety disorder, unspecified: Secondary | ICD-10-CM | POA: Diagnosis present

## 2020-08-21 DIAGNOSIS — K219 Gastro-esophageal reflux disease without esophagitis: Secondary | ICD-10-CM | POA: Diagnosis present

## 2020-08-21 DIAGNOSIS — I69354 Hemiplegia and hemiparesis following cerebral infarction affecting left non-dominant side: Secondary | ICD-10-CM | POA: Diagnosis not present

## 2020-08-21 DIAGNOSIS — Z8249 Family history of ischemic heart disease and other diseases of the circulatory system: Secondary | ICD-10-CM | POA: Diagnosis not present

## 2020-08-21 DIAGNOSIS — I480 Paroxysmal atrial fibrillation: Secondary | ICD-10-CM | POA: Diagnosis present

## 2020-08-21 DIAGNOSIS — I4891 Unspecified atrial fibrillation: Secondary | ICD-10-CM | POA: Diagnosis present

## 2020-08-21 DIAGNOSIS — I700 Atherosclerosis of aorta: Secondary | ICD-10-CM | POA: Diagnosis present

## 2020-08-21 DIAGNOSIS — F32A Depression, unspecified: Secondary | ICD-10-CM | POA: Diagnosis present

## 2020-08-21 DIAGNOSIS — R1084 Generalized abdominal pain: Secondary | ICD-10-CM | POA: Diagnosis present

## 2020-08-21 LAB — CBC WITH DIFFERENTIAL/PLATELET
Abs Immature Granulocytes: 0.02 10*3/uL (ref 0.00–0.07)
Basophils Absolute: 0.1 10*3/uL (ref 0.0–0.1)
Basophils Relative: 1 %
Eosinophils Absolute: 0.2 10*3/uL (ref 0.0–0.5)
Eosinophils Relative: 3 %
HCT: 33.5 % — ABNORMAL LOW (ref 36.0–46.0)
Hemoglobin: 10.2 g/dL — ABNORMAL LOW (ref 12.0–15.0)
Immature Granulocytes: 0 %
Lymphocytes Relative: 36 %
Lymphs Abs: 2.2 10*3/uL (ref 0.7–4.0)
MCH: 21.7 pg — ABNORMAL LOW (ref 26.0–34.0)
MCHC: 30.4 g/dL (ref 30.0–36.0)
MCV: 71.4 fL — ABNORMAL LOW (ref 80.0–100.0)
Monocytes Absolute: 0.5 10*3/uL (ref 0.1–1.0)
Monocytes Relative: 8 %
Neutro Abs: 3.2 10*3/uL (ref 1.7–7.7)
Neutrophils Relative %: 52 %
Platelets: 199 10*3/uL (ref 150–400)
RBC: 4.69 MIL/uL (ref 3.87–5.11)
RDW: 17.2 % — ABNORMAL HIGH (ref 11.5–15.5)
WBC: 6.1 10*3/uL (ref 4.0–10.5)
nRBC: 0 % (ref 0.0–0.2)

## 2020-08-21 LAB — COMPREHENSIVE METABOLIC PANEL
ALT: 22 U/L (ref 0–44)
AST: 20 U/L (ref 15–41)
Albumin: 3.5 g/dL (ref 3.5–5.0)
Alkaline Phosphatase: 97 U/L (ref 38–126)
Anion gap: 11 (ref 5–15)
BUN: 17 mg/dL (ref 8–23)
CO2: 21 mmol/L — ABNORMAL LOW (ref 22–32)
Calcium: 9 mg/dL (ref 8.9–10.3)
Chloride: 105 mmol/L (ref 98–111)
Creatinine, Ser: 0.97 mg/dL (ref 0.44–1.00)
GFR, Estimated: >60 mL/min (ref >60)
Glucose, Bld: 112 mg/dL — ABNORMAL HIGH (ref 70–99)
Potassium: 4.2 mmol/L (ref 3.5–5.1)
Sodium: 137 mmol/L (ref 135–145)
Total Bilirubin: 0.6 mg/dL (ref 0.3–1.2)
Total Protein: 6 g/dL — ABNORMAL LOW (ref 6.5–8.1)

## 2020-08-21 LAB — IRON AND TIBC
Iron: 59 ug/dL (ref 28–170)
Saturation Ratios: 15 % (ref 10.4–31.8)
TIBC: 384 ug/dL (ref 250–450)
UIBC: 325 ug/dL

## 2020-08-21 LAB — RETICULOCYTES
Immature Retic Fract: 10.7 % (ref 2.3–15.9)
RBC.: 4.53 MIL/uL (ref 3.87–5.11)
Retic Count, Absolute: 43.5 10*3/uL (ref 19.0–186.0)
Retic Ct Pct: 1 % (ref 0.4–3.1)

## 2020-08-21 LAB — FERRITIN: Ferritin: 19 ng/mL (ref 11–307)

## 2020-08-21 LAB — TROPONIN I (HIGH SENSITIVITY): Troponin I (High Sensitivity): 110 ng/L (ref <18)

## 2020-08-21 LAB — VITAMIN B12: Vitamin B-12: 307 pg/mL (ref 180–914)

## 2020-08-21 LAB — FOLATE: Folate: 9.3 ng/mL (ref >5.9)

## 2020-08-21 LAB — LIPASE, BLOOD: Lipase: 17 U/L (ref 11–51)

## 2020-08-21 MED ORDER — METOPROLOL TARTRATE 25 MG PO TABS
25.0000 mg | ORAL_TABLET | Freq: Two times a day (BID) | ORAL | Status: DC
  Administered 2020-08-21 – 2020-08-22 (×2): 25 mg via ORAL
  Filled 2020-08-21 (×3): qty 1

## 2020-08-21 MED ORDER — ASPIRIN 81 MG PO CHEW
81.0000 mg | CHEWABLE_TABLET | Freq: Every day | ORAL | Status: DC
  Administered 2020-08-21 – 2020-08-22 (×2): 81 mg via ORAL
  Filled 2020-08-21 (×2): qty 1

## 2020-08-21 MED ORDER — PANTOPRAZOLE SODIUM 40 MG PO TBEC
40.0000 mg | DELAYED_RELEASE_TABLET | Freq: Every day | ORAL | Status: DC
  Administered 2020-08-21 – 2020-08-22 (×2): 40 mg via ORAL
  Filled 2020-08-21: qty 1

## 2020-08-21 MED ORDER — DOXYCYCLINE HYCLATE 100 MG PO TABS
100.0000 mg | ORAL_TABLET | Freq: Two times a day (BID) | ORAL | Status: DC
  Administered 2020-08-21 – 2020-08-22 (×3): 100 mg via ORAL
  Filled 2020-08-21 (×3): qty 1

## 2020-08-21 MED ORDER — DICYCLOMINE HCL 10 MG PO CAPS
10.0000 mg | ORAL_CAPSULE | Freq: Three times a day (TID) | ORAL | Status: DC
Start: 2020-08-21 — End: 2020-08-22
  Administered 2020-08-21 – 2020-08-22 (×4): 10 mg via ORAL
  Filled 2020-08-21 (×6): qty 1

## 2020-08-21 MED ORDER — MORPHINE SULFATE (PF) 2 MG/ML IV SOLN
2.0000 mg | Freq: Once | INTRAVENOUS | Status: AC
  Administered 2020-08-21: 2 mg via INTRAVENOUS
  Filled 2020-08-21: qty 1

## 2020-08-21 MED ORDER — APIXABAN 5 MG PO TABS
5.0000 mg | ORAL_TABLET | Freq: Two times a day (BID) | ORAL | Status: DC
  Administered 2020-08-21 – 2020-08-22 (×3): 5 mg via ORAL
  Filled 2020-08-21 (×3): qty 1

## 2020-08-21 MED ORDER — ATORVASTATIN CALCIUM 80 MG PO TABS
80.0000 mg | ORAL_TABLET | Freq: Every day | ORAL | Status: DC
  Administered 2020-08-21 – 2020-08-22 (×2): 80 mg via ORAL
  Filled 2020-08-21 (×2): qty 1

## 2020-08-21 MED ORDER — AMLODIPINE BESYLATE 5 MG PO TABS
7.5000 mg | ORAL_TABLET | Freq: Every day | ORAL | Status: DC
  Administered 2020-08-21 – 2020-08-22 (×2): 7.5 mg via ORAL
  Filled 2020-08-21 (×2): qty 2

## 2020-08-21 NOTE — Progress Notes (Addendum)
Progress Note  Patient Name: Kimberly [name] Date of Encounter: 08/21/2020  [facility] Cardiologist: [name], MD   Subjective    no chest pain and no SOB  Inpatient Medications    Scheduled Meds: . amLODipine  7.5 mg Oral Daily  . apixaban  5 mg Oral BID  . aspirin  81 mg Oral Daily  . atorvastatin  80 mg Oral Daily  . bisacodyl  10 mg Rectal Once  . citalopram  10 mg Oral Daily  . metoprolol tartrate  25 mg Oral BID  . pantoprazole  40 mg Oral Daily  . polyethylene glycol  17 g Oral Daily  . senna-docusate  2 tablet Oral BID   Continuous Infusions:  PRN Meds: acetaminophen **OR** acetaminophen, oxyCODONE, traMADol   Vital Signs    Vitals:   08/20/20 2318 08/21/20 0708 08/21/20 1002 08/21/20 1047  BP: 116/80 136/86  125/90  Pulse: 64 62    Resp: 18 14 14 17   Temp: 98.7 F (37.1 C) 98 F (36.7 C)  98.7 F (37.1 C)  TempSrc: Oral Oral  Oral  SpO2: 96% 98%  97%  Weight:      Height:        Intake/Output Summary (Last 24 hours) at 08/21/2020 1159 Last data filed at 08/21/2020 0825 Gross per 24 hour  Intake 0 ml  Output --  Net 0 ml   Last 3 Weights 08/18/2020 07/28/2020 05/17/2020  Weight (lbs) 113 lb 114 lb 9.6 oz (No Data)  Weight (kg) 51.256 kg 51.982 kg (No Data)      Telemetry    SR - Personally Reviewed  ECG    SR with abnormal EKG see notes   - Personally Reviewed  Physical Exam   GEN: No acute distress.   Neck: No JVD Cardiac: RRR, no murmurs, rubs, or gallops.  Respiratory: Clear to auscultation bilaterally. GI: Soft, nontender, non-distended  MS: No edema; No deformity. Neuro:  Nonfocal  Psych: Normal affect   Labs    High Sensitivity Troponin:   Recent Labs  Lab 08/18/20 1027 08/19/20 0911 08/19/20 1118 08/19/20 1326 08/21/20 0554  TROPONINIHS 8 124* 149* 153* 110*      Chemistry Recent Labs  Lab 08/18/20 1027 08/19/20 0911 08/20/20 0811 08/21/20 0554  NA 142 141 138 137  K 3.5 3.2* 4.4 4.2  CL 107 107  107 105  CO2 25 24 23  21*  GLUCOSE 77 102* 79 112*  BUN 10 18 20 17   CREATININE 0.86 1.00 1.06* 0.97  CALCIUM 9.9 9.1 8.9 9.0  PROT 7.8 6.3*  --  6.0*  ALBUMIN 4.4 3.5  --  3.5  AST 27 23  --  20  ALT 30 26  --  22  ALKPHOS 124 110  --  97  BILITOT 0.7 0.6  --  0.6  GFRNONAA >60 >60 59* >60  ANIONGAP 10 10 8 11      Hematology Recent Labs  Lab 08/18/20 1027 08/19/20 0911 08/21/20 0554  WBC 7.2 6.2 6.1  RBC 5.41* 5.08 4.69  4.53  HGB 11.7* 11.1* 10.2*  HCT 39.0 36.2 33.5*  MCV 72.1* 71.3* 71.4*  MCH 21.6* 21.9* 21.7*  MCHC 30.0 30.7 30.4  RDW 17.7* 17.3* 17.2*  PLT 229 214 199    BNPNo results for input(s): BNP, PROBNP in the last 168 hours.   DDimer No results for input(s): DDIMER in the last 168 hours.   Radiology    ECHOCARDIOGRAM COMPLETE  Result Date:  08/19/2020    ECHOCARDIOGRAM REPORT   Patient Name:   Kimberly [name] Date of Exam: 08/19/2020 Medical Rec #:  [id number]         Height:       67.0 in Accession #:    [id number]        Weight:       113.0 lb Date of Birth:  24-Feb-1956        BSA:          1.587 m Patient Age:    65 years          BP:           113/77 mmHg Patient Gender: F                 HR:           76 bpm. Exam Location:  Inpatient Procedure: 2D Echo Indications:    elevated troponin  History:        Patient has prior history of Echocardiogram examinations, most                 recent 02/14/2018. CAD, Arrythmias:Atrial Fibrillation; Risk                 Factors:Hypertension.  Sonographer:    Johny Chess Referring Phys: [id number] [place]  1. Left ventricular ejection fraction, by estimation, is 65 to 70%. The left ventricle has normal function. The left ventricle has no regional wall motion abnormalities. There is mild concentric left ventricular hypertrophy. Left ventricular diastolic function could not be evaluated.  2. Right ventricular systolic function is normal. The right ventricular size is normal. There is normal pulmonary  artery systolic pressure. The estimated right ventricular systolic pressure is 63.0 mmHg.  3. Left atrial size was moderately dilated.  4. Right atrial size was mildly dilated.  5. The mitral valve is normal in structure. Mild mitral valve regurgitation. No evidence of mitral stenosis.  6. Tricuspid valve regurgitation is mild to moderate.  7. The aortic valve is normal in structure. Aortic valve regurgitation is not visualized. No aortic stenosis is present.  8. Aortic dilatation noted. There is mild dilatation of the ascending aorta, measuring 42 mm.  9. The inferior vena cava is normal in size with greater than 50% respiratory variability, suggesting right atrial pressure of 3 mmHg. FINDINGS  Left Ventricle: Left ventricular ejection fraction, by estimation, is 65 to 70%. The left ventricle has normal function. The left ventricle has no regional wall motion abnormalities. The left ventricular internal cavity size was normal in size. There is  mild concentric left ventricular hypertrophy. Left ventricular diastolic function could not be evaluated due to atrial fibrillation. Left ventricular diastolic function could not be evaluated. Right Ventricle: The right ventricular size is normal. No increase in right ventricular wall thickness. Right ventricular systolic function is normal. There is normal pulmonary artery systolic pressure. The tricuspid regurgitant velocity is 1.91 m/s, and  with an assumed right atrial pressure of 3 mmHg, the estimated right ventricular systolic pressure is 16.0 mmHg. Left Atrium: Left atrial size was moderately dilated. Right Atrium: Right atrial size was mildly dilated. Pericardium: There is no evidence of pericardial effusion. Mitral Valve: The mitral valve is normal in structure. There is mild thickening of the mitral valve leaflet(s). Mild mitral valve regurgitation. No evidence of mitral valve stenosis. Tricuspid Valve: The tricuspid valve is normal in structure. Tricuspid valve  regurgitation is mild to moderate. No evidence of  tricuspid stenosis. Aortic Valve: The aortic valve is normal in structure. Aortic valve regurgitation is not visualized. No aortic stenosis is present. Pulmonic Valve: The pulmonic valve was normal in structure. Pulmonic valve regurgitation is not visualized. No evidence of pulmonic stenosis. Aorta: Aortic dilatation noted. There is mild dilatation of the ascending aorta, measuring 42 mm. Venous: The inferior vena cava is normal in size with greater than 50% respiratory variability, suggesting right atrial pressure of 3 mmHg. IAS/Shunts: No atrial level shunt detected by color flow Doppler.  LEFT VENTRICLE PLAX 2D LVIDd:         4.80 cm LVIDs:         3.70 cm LV PW:         0.80 cm LV IVS:        1.10 cm LVOT diam:     2.00 cm LV SV:         53 LV SV Index:   34 LVOT Area:     3.14 cm  RIGHT VENTRICLE         IVC TAPSE (M-mode): 1.6 cm  IVC diam: 1.80 cm LEFT ATRIUM             Index       RIGHT ATRIUM           Index LA diam:        3.80 cm 2.40 cm/m  RA Area:     18.50 cm LA Vol (A2C):   96.6 ml 60.89 ml/m RA Volume:   50.70 ml  31.96 ml/m LA Vol (A4C):   82.3 ml 51.87 ml/m LA Biplane Vol: 89.3 ml 56.29 ml/m  AORTIC VALVE LVOT Vmax:   94.00 cm/s LVOT Vmean:  56.400 cm/s LVOT VTI:    0.170 m  AORTA Ao Root diam: 3.20 cm Ao Asc diam:  4.20 cm TRICUSPID VALVE TV Peak grad:   19.5 mmHg TV Vmax:        2.21 m/s TR Peak grad:   14.6 mmHg TR Vmax:        191.00 cm/s  SHUNTS Systemic VTI:  0.17 m Systemic Diam: 2.00 cm Ena Dawley MD Electronically signed by Ena Dawley MD Signature Date/Time: 08/19/2020/7:49:19 PM    Final     Cardiac Studies   Echocardiogram 08/19/2020: 1. Left ventricular ejection fraction, by estimation, is 65 to 70%. The  left ventricle has normal function. The left ventricle has no regional  wall motion abnormalities. There is mild concentric left ventricular  hypertrophy. Left ventricular diastolic  function could not be  evaluated.  2. Right ventricular systolic function is normal. The right ventricular  size is normal. There is normal pulmonary artery systolic pressure. The  estimated right ventricular systolic pressure is 69.6 mmHg.  3. Left atrial size was moderately dilated.  4. Right atrial size was mildly dilated.  5. The mitral valve is normal in structure. Mild mitral valve  regurgitation. No evidence of mitral stenosis.  6. Tricuspid valve regurgitation is mild to moderate.  7. The aortic valve is normal in structure. Aortic valve regurgitation is  not visualized. No aortic stenosis is present.  8. Aortic dilatation noted. There is mild dilatation of the ascending  aorta, measuring 42 mm.  9. The inferior vena cava is normal in size with greater than 50%  respiratory variability, suggesting right atrial pressure of 3 mmHg.    Patient Profile     65 y.o. female with a history of CAD s/p CABG in 2008, paroxysmal atrial  fibrillation on Eliquis, chronic dyspnea, occluded internal carotid arteries bilaterally, stroke in 2019with residual life sided weakness, hypertension, hyperlipidemia, and long smoking historyand had been evaluated and cards was actually signing off but pt complained of upper ext pain and abd pain so EKG was done and read by computer as STEMI.   Assessment & Plan    Abnormal EKG read as STEMI due to slurring of ST in lead III but no other leads and otherwise no changes in deep T wave inversion ant lat.   Her tropoin I down from 153 on the 7th now 110.   K+ is stable and Mg+ yesterday 2.4   Paroxysmal Atrial Fibrillation with RVR - Patient presented with abdominal pain and vomiting. Symptoms resolved but then she went into atrial fibrillation with RVR in the EDand was started on IV Cardizem. She does have a history of atrial fibrillation.  -Patient converted to sinus rhythm overnight. Rates in the 60's. - Potassium 3.2 yesterday. Repleted and 4.4 today. - Magnesium 1.6  yesterday. Repleted and 2.4 today. -TSH normal.  - Echo showed LVEF of 65-70% with normal wall motion and mild LVH.  -Continue Lopressor 25mg  twice daily. - CHA2DS2-VASC = 5 (CAD, HTN, stroke x2, female). Continue Eliquis 5mg  twice daily.  Chest Pain Elevated Troponin  History ofCAD s/p [id number] -She did have one episode of chest pain with radiation to left shoulder with her GI symptoms on day of presentation that she has difficulty describing. Pain resolved on its own. No recurrent chest pain. Last Myoview in 03/2020 of this year showeda large partially reversible lateral wall perfusion defect consistent with infarct with a significant region of peri-infarct ischemia. - EKG shows T wave inversions in anterolateral leads as well as lead II but these are not new. - Initial high-sensitivity troponin negative but repeat 124 >> 149. Not consistent with ACS. - Echo shows normal EF and normal wall motion. - Continue aspirin and high-intensity statin. - Possible demand ischemia secondary to atrial fibrillation with RVR with underlying CAD. No additional ischemic work-up planned.  Carotid Artery Disease - Known bilaterally occluded ICAs. - Being managed medically. Continue aspirin and high-intensity statin.   Ascending Aortic Aneurysm  - Chest CT in 04/2020 showed stable ascending aortic aneurysm of 4.3cm compared to prior imaging in 02/2018. Annual imaging recommended.  - Echo this admission showed mild dilatation of ascending aorta measuring 42 mm.  - Continue to follow as outpatient.  Dilatation of Infrarenal Abdominal Aorta - CT this admission showed focal dilation of infrarenal abdominal aorta measuring up to 2.8 cm.  - Follow-up ultrasound recommended in 5 years.  Hypertension - BP mildly this morning. - Continue Lopressor 25mg  twice daily. - Restart home Amlodipine 7.5mg  daily.  Hyperlipidemia - Continue home Lipitor 80mg  daily.  Chronic Anemia - Hemoglobin stable  at 11.1 which is around baseline.  Abdominal Pain - Patient continues to have right sided abdominal pain. - Abdominal/pelvic CT unremarkable. - Lipase and LFTS normal. - Management per primary team.    [facility] will sign off.   Tool for discharge from cards.  Medication Recommendations:  As before Other recommendations (labs, testing, etc):  As before Follow up as an outpatient:  As before  For questions or updates, please contact [place] Please consult [email or web address] for contact info under     Signed, Cecilie Kicks, NP  08/21/2020, 11:59 AM    Cardiology attending  Asked to see patient with abnormal ECG. She has no new symptoms and enzymes reassuring.  Her ECG is abnormal at baseline. No significant change today. [place] for DC home from my perspective on current meds.  Carleene Overlie Abbe Bula,MD

## 2020-08-21 NOTE — Progress Notes (Signed)
Obtained EKG per MD order. NT made this RN aware that critical STEMI resulted. Darrell Jewel, MD and [place] Crane, MD made aware.  New orders: Administer morphine IV and trend troponins.

## 2020-08-21 NOTE — Progress Notes (Signed)
PROGRESS NOTE  Kimberly [name] VHQ:[id number] DOB: 1956/04/30 DOA: 08/18/2020 PCP: [name], FNP  HPI/Recap of past 24 hours: Kimberly Beecham Goinsis a 65 y.o.femalewithhistory of CAD status post CABG, A. fib, stroke with left-sided weakness and aneurysm presents to the ER with complaint of abdominal pain. Pain is mostly in the right lower quadrant had 2 episode of nausea vomiting yesterday morning. Pain has been persistent and has no diarrhea. Denies chest pain or shortness of breath.  ED Course:In the ER CT abdomen pelvis does not show any acute. After giving pain relief medication patient's abdominal pain resolved. But patient was in A. fib with RVR for which patient was started on Cardizem drip after patient refused cardioversion. Labs are largely unremarkable except for anemia with hemoglobin of 11.7. Patient admitted for further management of A. fib with RVR. Covid test was negative.  Currently off Cardizem drip.  She is in A. fib however her rate is controlled.  on 08/20/20 She was started on p.o. Lopressor 25 mg twice daily and Eliquis for secondary CVA prevention by cardiology.  TSH was normal 0.897.  Her 2D echo was completed.  08/21/20: Seen and examined at bedside.  She continues to report diffuse abdominal pain, worse on the right upper quadrant.  He denies any nausea or vomiting.  She had an abnormal EKG earlier this morning.  Reviewed by cardiology.  Okay to discharge from a cardiac standpoint.  Patient has requested an STD screening evaluation.  Tests have been ordered.  Awaiting results.  Will treat empirically with doxycycline 100 mg twice daily x7 days.   Assessment/Plan: Principal Problem:   Atrial fibrillation with RVR (HCC) Active Problems:   Essential hypertension   CAD, ARTERY BYPASS GRAFT   Diffuse abdominal pain, unclear etiology The patient suspects STI AND requests to be tested for sexually transmitted infection. HIV, acute hepatitis panel,  trichomonas, GC/Chlamydia sent We will treat empirically for STI with doxycycline 100 mg twice daily x7 days. Initially was associated with nausea and vomiting Nausea and vomiting have resolved. CT abdomen and pelvis was unremarkable. LFTs and lipase level have been unremarkable Reported constipation for 3 days Laxatives ordered, if no improvement will consult GI  Presumed STI Patient suspected sexually transmitted infection as a cause of her abdominal pain Management per above, if improved will discharge on doxycycline to follow-up with her PCP If abdominal pain persists then will consult GI  Abnormal twelve-lead EKG Read as STEMI on 08/31/2020 due to slurring of ST in lead III but no other leads and otherwise no changes and deep T wave inversion in lateral. Troponin downtrending 110 from 153. Reviewed by cardiology Okay to discharge from a cardiac standpoint  Paroxysmal A. fib with RVR, rate is now controlled Initially requiring diltiazem drip Currently on Lopressor 25 mg twice daily On Eliquis for secondary CVA prevention CHA2DS2-VASc of 5, continue Eliquis as recommended by cardiology Appreciate cardiology's assistance  Elevated troponin likely demand ischemia in the setting of A. fib with RVR Seen by cardiology 2D echo completed showed LVEF 65 to 70% with normal wall motion and mild left ventricular hypertrophy. Troponin is downtrending.  Ascending aortic aneurysm Currently measuring 4.2 cm Continue monitoring as outpatient Annual imaging is recommended.  Dilatation of infrarenal abdominal aorta CT abdomen and pelvis showed focal dilatation of the infrarenal abdominal aorta measuring up to 2.8 cm Follow-up ultrasound recommended in 5 years.  Essential hypertension BP is currently at goal Continue Lopressor 25 mg twice daily and amlodipine 7.5 mg  daily.  Hyperlipidemia Continue Lipitor 80 mg daily  Chronic anxiety/depression Continue home Celexa  GERD Continue  Protonix 40 mg daily.  Tobacco use disorder Tobacco cessation counseling done at bedside.   Code Status: Full code  Family Communication: None at bedside.  Disposition Plan: Pending PT evaluation.   Consultants:  Cardiology, signed off on 08/20/2020.  Procedures:  2D echo  Antimicrobials:  None.  DVT prophylaxis: Eliquis.  Status is: Observation    Dispo:  Patient From: Home  Planned Disposition: Home with Health Care Svc  Expected discharge date: 08/22/2020  Medically stable for discharge: No, ongoing management of abdominal pain of unclear etiology.    Objective: Vitals:   08/20/20 2318 08/21/20 0708 08/21/20 1002 08/21/20 1047  BP: 116/80 136/86  125/90  Pulse: 64 62    Resp: 18 14 14 17   Temp: 98.7 F (37.1 C) 98 F (36.7 C)  98.7 F (37.1 C)  TempSrc: Oral Oral  Oral  SpO2: 96% 98%  97%  Weight:      Height:        Intake/Output Summary (Last 24 hours) at 08/21/2020 1256 Last data filed at 08/21/2020 0825 Gross per 24 hour  Intake 0 ml  Output -  Net 0 ml   Filed Weights   08/18/20 2300  Weight: 51.3 kg    Exam:  . General: 65 y.o. year-old female well-developed well-nourished in no acute distress.  Alert and oriented x3. . Cardiovascular: Regular rate and rhythm no rubs or gallops. [name] Respiratory: Clear to auscultation no wheezes or rales.   . Abdomen: Diffuse abdominal pain worse on the right upper quadrant.  Nondistended.  Bowel sounds present.   . Musculoskeletal: No lower extremity edema bilaterally.   . Skin: No ulcerative lesions noted. [name] Psychiatry: Mood is appropriate for condition and setting.   Data Reviewed: CBC: Recent Labs  Lab 08/18/20 1027 08/19/20 0911 08/21/20 0554  WBC 7.2 6.2 6.1  NEUTROABS  --  3.3 3.2  HGB 11.7* 11.1* 10.2*  HCT 39.0 36.2 33.5*  MCV 72.1* 71.3* 71.4*  PLT 229 214 [id number]   Basic Metabolic Panel: Recent Labs  Lab 08/18/20 1027 08/19/20 0911 08/19/20 1326 08/20/20 0811 08/21/20 0554  NA  142 141  --  138 137  K 3.5 3.2*  --  4.4 4.2  CL 107 107  --  107 105  CO2 25 24  --  23 21*  GLUCOSE 77 102*  --  79 112*  BUN 10 18  --  20 17  CREATININE 0.86 1.00  --  1.06* 0.97  CALCIUM 9.9 9.1  --  8.9 9.0  MG  --   --  1.6* 2.4  --    GFR: Estimated Creatinine Clearance: 47.5 mL/min (by C-G formula based on SCr of 0.97 mg/dL). Liver Function Tests: Recent Labs  Lab 08/18/20 1027 08/19/20 0911 08/21/20 0554  AST 27 23 20   ALT 30 26 22   ALKPHOS 124 110 97  BILITOT 0.7 0.6 0.6  PROT 7.8 6.3* 6.0*  ALBUMIN 4.4 3.5 3.5   Recent Labs  Lab 08/18/20 1027 08/21/20 0554  LIPASE 18 17   No results for input(s): AMMONIA in the last 168 hours. Coagulation Profile: No results for input(s): INR, PROTIME in the last 168 hours. Cardiac Enzymes: No results for input(s): CKTOTAL, CKMB, CKMBINDEX, TROPONINI in the last 168 hours. BNP (last 3 results) No results for input(s): PROBNP in the last 8760 hours. HbA1C: No results for input(s): HGBA1C in  the last 72 hours. CBG: No results for input(s): GLUCAP in the last 168 hours. Lipid Profile: No results for input(s): CHOL, HDL, LDLCALC, TRIG, CHOLHDL, LDLDIRECT in the last 72 hours. Thyroid Function Tests: Recent Labs    08/19/20 0911  TSH 0.897   Anemia Panel: Recent Labs    08/21/20 0554  VITAMINB12 307  FOLATE 9.3  FERRITIN 19  TIBC 384  IRON 59  RETICCTPCT 1.0   Urine analysis:    Component Value Date/Time   COLORURINE YELLOW 02/13/2018 1209   APPEARANCEUR CLEAR 02/13/2018 1209   LABSPEC 1.014 02/13/2018 1209   PHURINE 5.0 02/13/2018 1209   GLUCOSEU NEGATIVE 02/13/2018 1209   HGBUR SMALL (A) 02/13/2018 1209   BILIRUBINUR NEGATIVE 02/13/2018 1209   KETONESUR 5 (A) 02/13/2018 1209   PROTEINUR 100 (A) 02/13/2018 1209   UROBILINOGEN 0.2 05/26/2011 2243   NITRITE NEGATIVE 02/13/2018 1209   LEUKOCYTESUR NEGATIVE 02/13/2018 1209   Sepsis Labs: @LABRCNTIP (procalcitonin:4,lacticidven:4)  ) Recent Results  (from the past 240 hour(s))  Resp Panel by RT-PCR (Flu A&B, Covid) Nasopharyngeal Swab     Status: None   Collection Time: 08/19/20  3:54 AM   Specimen: Nasopharyngeal Swab; Nasopharyngeal(NP) swabs in vial transport medium  Result Value Ref Range Status   SARS Coronavirus 2 by RT PCR NEGATIVE NEGATIVE Final    Comment: (NOTE) SARS-CoV-2 target nucleic acids are NOT DETECTED.  The SARS-CoV-2 RNA is generally detectable in upper respiratory specimens during the acute phase of infection. The lowest concentration of SARS-CoV-2 viral copies this assay can detect is 138 copies/mL. A negative result does not preclude SARS-Cov-2 infection and should not be used as the sole basis for treatment or other patient management decisions. A negative result may occur with  improper specimen collection/handling, submission of specimen other than nasopharyngeal swab, presence of viral mutation(s) within the areas targeted by this assay, and inadequate number of viral copies(<138 copies/mL). A negative result must be combined with clinical observations, patient history, and epidemiological information. The expected result is Negative.  Fact Sheet for Patients:  EntrepreneurPulse.com.au  Fact Sheet for Healthcare Providers:  IncredibleEmployment.be  This test is no t yet approved or cleared by the [place] FDA and  has been authorized for detection and/or diagnosis of SARS-CoV-2 by FDA under an Emergency Use Authorization (EUA). This EUA will remain  in effect (meaning this test can be used) for the duration of the COVID-19 declaration under Section 564(b)(1) of the Act, 21 U.S.C.section 360bbb-3(b)(1), unless the authorization is terminated  or revoked sooner.       Influenza A by PCR NEGATIVE NEGATIVE Final   Influenza B by PCR NEGATIVE NEGATIVE Final    Comment: (NOTE) The Xpert Xpress SARS-CoV-2/FLU/RSV plus assay is intended as an aid in the  diagnosis of influenza from Nasopharyngeal swab specimens and should not be used as a sole basis for treatment. Nasal washings and aspirates are unacceptable for Xpert Xpress SARS-CoV-2/FLU/RSV testing.  Fact Sheet for Patients: EntrepreneurPulse.com.au  Fact Sheet for Healthcare Providers: IncredibleEmployment.be  This test is not yet approved or cleared by the [place] FDA and has been authorized for detection and/or diagnosis of SARS-CoV-2 by FDA under an Emergency Use Authorization (EUA). This EUA will remain in effect (meaning this test can be used) for the duration of the COVID-19 declaration under Section 564(b)(1) of the Act, 21 U.S.C. section 360bbb-3(b)(1), unless the authorization is terminated or revoked.  Performed at [facility], [place] [address], [place],  [address]  Studies: No results found.  Scheduled Meds: . amLODipine  7.5 mg Oral Daily  . apixaban  5 mg Oral BID  . aspirin  81 mg Oral Daily  . atorvastatin  80 mg Oral Daily  . bisacodyl  10 mg Rectal Once  . citalopram  10 mg Oral Daily  . metoprolol tartrate  25 mg Oral BID  . pantoprazole  40 mg Oral Daily  . polyethylene glycol  17 g Oral Daily  . senna-docusate  2 tablet Oral BID    Continuous Infusions:   LOS: 0 days     Kayleen Memos, MD [facility] Pager [phone]  If 7PM-7AM, please contact night-coverage [email or web address] Password [facility] 08/21/2020, 12:56 PM

## 2020-08-21 NOTE — Progress Notes (Signed)
Notified by nurse that EKG says STEMI. On further investigation - patient was complaining of BL upper extremity pain and abdominal pain - so nurse notified day team attending. Day team attending ordered EKG and trop, and advised that nurse notify myself and cards. Nurse reported that she was unable to notify cards. I personally called cardiology. We are awaiting EKG export so we can see it in the system. Nurse reports that patient is not having dyspnea, or actual chest pain.  Will follow EKG and trop.  Jamina Macbeth Zierle - [employer] - unable to present to bedside.

## 2020-08-21 NOTE — Progress Notes (Signed)
Retimed refused medications for 1300. Patient refusing meds until she can eat, pending cardiology evaluation, currently NPO.  Norina Buzzard, PharmD PGY1 Pharmacy Resident 08/21/2020 11:44 AM

## 2020-08-21 NOTE — Evaluation (Signed)
Physical Therapy Evaluation Patient Details Name: Kimberly [name] MRN: [id number] DOB: 12-May-1956 Today's Date: 08/21/2020   History of Present Illness  65 y.o. female with history of CAD status post CABG, A. fib, stroke with left-sided weakness and aneurysm presents to the ER with complaint of abdominal pain.  Pain is mostly in the right lower quadrant had 2 episode of nausea vomiting. Pt admitted for a fib with RVR.    Clinical Impression  Pt admitted with above diagnosis. PTA pt lived at home with her sister. She has a caretaker 2.5 hrs, 7 days/week. She utilizes a w/c for primary mobility but does ambulate to/from bathroom with RW. On eval, pt required min assist bed mobility and transfers. Pt currently with functional limitations due to the deficits listed below. Pt will benefit from skilled PT to increase their independence and safety with mobility to allow discharge to the venue listed below.       Follow Up Recommendations Home health PT;Supervision for mobility/OOB    Equipment Recommendations  None recommended by PT    Recommendations for Other Services       Precautions / Restrictions Precautions Precautions: Fall      Mobility  Bed Mobility Overal bed mobility: Needs Assistance Bed Mobility: Sit to Supine;Supine to Sit     Supine to sit: Min assist;HOB elevated Sit to supine: Min assist;HOB elevated   General bed mobility comments: +rail, increased time, assist with BLE    Transfers Overall transfer level: Needs assistance Equipment used: 1 person hand held assist Transfers: Sit to/from [facility] Sit to Stand: Min assist Stand pivot transfers: Min assist       General transfer comment: assist to power up and pivot to/from [facility], increased time to stabilize initial standing balance  Ambulation/Gait             General Gait Details: deferred/pt declining  Stairs            Wheelchair Mobility    Modified Rankin (Stroke  Patients Only)       Balance Overall balance assessment: Needs assistance Sitting-balance support: Feet supported;Single extremity supported Sitting balance-Leahy Scale: Fair     Standing balance support: Single extremity supported;During functional activity;Bilateral upper extremity supported Standing balance-Leahy Scale: Poor Standing balance comment: reliant on external support                             Pertinent Vitals/Pain Pain Assessment: Faces Faces Pain Scale: Hurts little more Pain Location: abdomen Pain Descriptors / Indicators: Sore Pain Intervention(s): Monitored during session;Repositioned    Home Living Family/patient expects to be discharged to:: Private residence Living Arrangements: Other relatives (sister) Available Help at Discharge: Family;Personal care attendant Type of Home: Apartment Home Access: Level entry     Home Layout: One level Home Equipment: Environmental consultant - 2 wheels;Shower seat - built in;Wheelchair - manual;Cane - quad      Prior Function Level of Independence: Needs assistance   Gait / Transfers Assistance Needed: mod I transfers to/from wheelchair. RW for amb to/from bathroom from wheelchair.  ADL's / Homemaking Assistance Needed: Pt has a caretaker 2.5 hrs 7 days/week. Family assists with laundry, grocery shopping, and transportation.        Hand Dominance   Dominant Hand: Right    Extremity/Trunk Assessment   Upper Extremity Assessment Upper Extremity Assessment: LUE deficits/detail;Generalized weakness LUE Deficits / Details: h/o CVA with residual L weakness    Lower Extremity  Assessment Lower Extremity Assessment: LLE deficits/detail;Generalized weakness LLE Deficits / Details: h/o CVA with residual L weakness    Cervical / Trunk Assessment Cervical / Trunk Assessment: Kyphotic  Communication   Communication: No difficulties  Cognition Arousal/Alertness: Awake/alert Behavior During Therapy: Agitated Overall  Cognitive Status: No family/caregiver present to determine baseline cognitive functioning                                 General Comments: generally agitated demeanor but will express appreciation with increased time. Decreased safety awareness.      General Comments      Exercises     Assessment/Plan    PT Assessment Patient needs continued PT services  PT Problem List Decreased strength;Decreased mobility;Decreased safety awareness;Decreased activity tolerance;Pain;Decreased balance       PT Treatment Interventions DME instruction;Therapeutic activities;Gait training;Therapeutic exercise;Patient/family education;Balance training;Functional mobility training    PT Goals (Current goals can be found in the Care Plan section)  Acute Rehab PT Goals Patient Stated Goal: home PT Goal Formulation: With patient Time For Goal Achievement: 09/04/20 Potential to Achieve Goals: Good    Frequency Min 3X/week   Barriers to discharge        Co-evaluation               AM-PAC PT "6 Clicks" Mobility  Outcome Measure Help needed turning from your back to your side while in a flat bed without using bedrails?: A Little Help needed moving from lying on your back to sitting on the side of a flat bed without using bedrails?: A Little Help needed moving to and from a bed to a chair (including a wheelchair)?: A Little Help needed standing up from a chair using your arms (e.g., wheelchair or bedside chair)?: A Little Help needed to walk in hospital room?: A Little Help needed climbing 3-5 steps with a railing? : A Lot 6 Click Score: 17    End of Session Equipment Utilized During Treatment: Gait belt Activity Tolerance: Patient tolerated treatment well Patient left: in bed;with call bell/phone within reach;with bed alarm set Nurse Communication: Mobility status PT Visit Diagnosis: Unsteadiness on feet (R26.81);Other abnormalities of gait and mobility (R26.89)    Time:  4627-0350 PT Time Calculation (min) (ACUTE ONLY): 18 min   Charges:   PT Evaluation $PT Eval Moderate Complexity: 1 Mod          Lorrin Goodell, PT  Office # [phone] Pager [phone]   Lorriane Shire 08/21/2020, 2:54 PM

## 2020-08-21 NOTE — Progress Notes (Signed)
Patient refusing morning medications until after breakfast.

## 2020-08-21 NOTE — Progress Notes (Signed)
PT Cancellation Note  Patient Details Name: Kimberly [name] MRN: [id number] DOB: 09-16-1955   Cancelled Treatment:    Reason Eval/Treat Not Completed: Medical issues which prohibited therapy. Awaiting cardiology consult following EKG and possible STEMI. PT to follow and proceed with eval when medically ready/cleared by cardiology.   Lorriane Shire 08/21/2020, 9:04 AM   Lorrin Goodell, PT  Office # [phone] Pager [phone]

## 2020-08-22 DIAGNOSIS — R109 Unspecified abdominal pain: Secondary | ICD-10-CM

## 2020-08-22 DIAGNOSIS — I4891 Unspecified atrial fibrillation: Secondary | ICD-10-CM | POA: Diagnosis not present

## 2020-08-22 MED ORDER — POLYETHYLENE GLYCOL 3350 17 G PO PACK: 17.0000 g | PACK | Freq: Every day | ORAL | 0 refills | Status: DC

## 2020-08-22 MED ORDER — FERROUS SULFATE 325 (65 FE) MG PO TABS: 325.0000 mg | ORAL_TABLET | Freq: Every day | ORAL | Status: DC

## 2020-08-22 MED ORDER — TRAMADOL HCL 50 MG PO TABS: 50.0000 mg | ORAL_TABLET | Freq: Four times a day (QID) | ORAL | 0 refills | Status: AC | PRN

## 2020-08-22 MED ORDER — DICYCLOMINE HCL 10 MG PO CAPS: 10.0000 mg | ORAL_CAPSULE | Freq: Three times a day (TID) | ORAL | 0 refills | Status: DC

## 2020-08-22 MED ORDER — FERROUS SULFATE 325 (65 FE) MG PO TABS
325.0000 mg | ORAL_TABLET | Freq: Every day | ORAL | 0 refills | Status: DC
Start: 2020-08-22 — End: 2020-10-04

## 2020-08-22 MED ORDER — AMLODIPINE BESYLATE 2.5 MG PO TABS
7.5000 mg | ORAL_TABLET | Freq: Every day | ORAL | 0 refills | Status: DC
Start: 2020-08-23 — End: 2020-08-22

## 2020-08-22 MED ORDER — FERROUS SULFATE 325 (65 FE) MG PO TABS: 325.0000 mg | ORAL_TABLET | Freq: Every day | ORAL | 0 refills | Status: DC

## 2020-08-22 MED ORDER — TRAMADOL HCL 50 MG PO TABS: 50.0000 mg | ORAL_TABLET | Freq: Four times a day (QID) | ORAL | 0 refills | Status: DC | PRN

## 2020-08-22 MED ORDER — METOPROLOL TARTRATE 25 MG PO TABS: 25.0000 mg | ORAL_TABLET | Freq: Two times a day (BID) | ORAL | 0 refills | Status: DC

## 2020-08-22 MED ORDER — METOPROLOL TARTRATE 25 MG PO TABS
25.0000 mg | ORAL_TABLET | Freq: Two times a day (BID) | ORAL | 0 refills | Status: DC
Start: 2020-08-22 — End: 2020-09-30

## 2020-08-22 MED ORDER — AMLODIPINE BESYLATE 2.5 MG PO TABS: 7.5000 mg | ORAL_TABLET | Freq: Every day | ORAL | 0 refills | Status: DC

## 2020-08-22 MED ORDER — FERROUS SULFATE 325 (65 FE) MG PO TABS
325.0000 mg | ORAL_TABLET | Freq: Every day | ORAL | Status: DC
Start: 2020-08-23 — End: 2020-08-22

## 2020-08-22 NOTE — Plan of Care (Signed)
Progressing, will continue to monitor.  

## 2020-08-22 NOTE — Consult Note (Signed)
Weekend coverage for Dr. Collene Mares and Benson Norway  Consultation  Referring Provider: Dr. Nevada Crane     Primary Care Physician:  [name], [place] Primary Gastroenterologist: Dr. Benson Norway         Reason for Consultation:   Abdominal pain         HPI:   Kimberly [name] is a 65 y.o. female with a past medical history as listed below including CAD status post CABG, A. fib, stroke with left-sided weakness and aneurysm who presented to the ER with a complaint of abdominal pain on 08/18/2020.  Apparently the pain initially reduced with pain medicines in the ER but patient was found to be in A. fib with RVR and admitted to the hospital.  We are consulted in regards to ongoing abdominal pain since admission.    Since hospitalization her A. fib is now rate controlled and she is off of her Cardizem drip.  Cardiology has signed off as of yesterday and are okay for discharge from their standpoint.  Initially abdominal pain was thought possibly related to constipation as patient had not had a bowel movement in the past 3 to 4 days, she was given some laxatives but continued with pain after a bowel movement yesterday.  Bentyl was added overnight by our service.    Today, the patient tells me that on Wednesday, 08/18/2020 she was awoken from her sleep at about 4:00 in the morning with a terrible "cramping/stabbing" in her right lower quadrant rated as a 10/10.  This hurt so much that she vomited.  Nothing seemed to make this any better or worse so she presented to the ER.  Tells me that there they gave her some medicine which did initially help but then admitted her for "my heart".  Since being in the hospital she has continued with an 8-10/10 right lower quadrant abdominal pain which is very tender to touch and seems worse when she gets up to move around or when she sits down.  Tells me that she was able to have a successful bowel movement yesterday with some laxatives but this did not help the pain at all.  Describes that she is ready  to go home.  Denies any pushing or pulling or lifting of things recently.     Denies fever, chills, further nausea or vomiting or blood in her stool.     GI history:    01/30/2020 colonoscopy with Dr. Benson Norway: - One 3 mm polyp in the ascending colon, removed with a cold snare. Resected and retrieved. - Diverticulosis in the sigmoid colon - A few non-bleeding colonic angiodysplastic lesions.  Past Medical History:  Diagnosis Date  . Bicipital tenosynovitis   . Coronary artery disease 05/2007   [id number]   . Fall 02/13/2018  . Family history of ischemic heart disease   . Gout   . Hammer toe   . Hip, thigh, leg, and ankle, insect bite, nonvenomous, without mention of infection(916.4)   . Hyperlipidemia   . Hypertension   . Other abnormality of red blood cells   . Rotator cuff syndrome of left shoulder   . Stroke [facility])     Past Surgical History:  Procedure Laterality Date  . CARDIAC CATHETERIZATION    . COLONOSCOPY WITH PROPOFOL N/A 01/30/2020   Procedure: COLONOSCOPY WITH PROPOFOL;  Surgeon: Carol Ada, MD;  Location: [facility];  Service: Endoscopy;  Laterality: N/A;  . CORONARY ARTERY BYPASS GRAFT     triple  . CORONARY ARTERY BYPASS GRAFT  2008  . POLYPECTOMY  01/30/2020   Procedure: POLYPECTOMY;  Surgeon: Carol Ada, MD;  Location: [facility];  Service: Endoscopy;;  . TUBAL LIGATION      Family History  Problem Relation Age of Onset  . Hypertension Mother   . Heart attack Father   . Diabetes Father   . Kidney failure Father   . Anxiety disorder Sister   . Sarcoidosis Sister      Social History   Tobacco Use  . Smoking status: Former Smoker    Packs/day: 2.00    Years: 35.00    Pack years: 70.00    Types: Cigarettes    Quit date: 08/12/2019    Years since quitting: 1.0  . Smokeless tobacco: Former Systems developer    Quit date: 08/09/2008  . Tobacco comment: had assistance with the quit smoking - had patches and lozenges  Vaping Use  . Vaping Use: Never used   Substance Use Topics  . Alcohol use: Yes    Comment: ocassionally wine  . Drug use: Yes    Types: Marijuana    Comment: history of cocaine use    Prior to Admission medications   Medication Sig Start Date End Date Taking? Authorizing Provider  amLODipine (NORVASC) 5 MG tablet Take 1&1/2 tablets 7.5 mg daily Patient taking differently: Take 7.5 mg by mouth daily. Take 1&1/2 tablets 7.5 mg daily 03/18/20  Yes Hochrein, Jeneen Rinks, MD  apixaban (ELIQUIS) 5 MG TABS tablet Take 1 tablet (5 mg total) by mouth 2 (two) times daily. 07/14/20  Yes [name], FNP  aspirin (QC LO-DOSE ASPIRIN) 81 MG EC tablet Take 1 tablet (81 mg total) by mouth daily. Swallow whole. 07/14/20  Yes [name], FNP  atorvastatin (LIPITOR) 80 MG tablet TAKE 1 TABLET BY MOUTH DAILY Patient taking differently: Take 80 mg by mouth daily. 08/18/20  Yes [name], FNP  meclizine (ANTIVERT) 25 MG tablet Take 1 tablet (25 mg total) by mouth 3 (three) times daily as needed for dizziness. 04/25/20  Yes Isla Pence, MD  MITIGARE 0.6 MG CAPS TAKE 1 CAPSULE BY MOUTH 2 TIMES DAILY Patient taking differently: Take 1 capsule by mouth 2 (two) times daily as needed (pain and inflammation). 07/22/20  Yes [name], FNP  Multiple Vitamin (MULTIVITAMIN) capsule Take 1 capsule by mouth daily.   Yes [provider]  omeprazole (PRILOSEC) 20 MG capsule Take 1 capsule (20 mg total) by mouth 2 (two) times daily before a meal. 05/17/20  Yes [name], [place]  Blood Pressure Monitoring (BLOOD PRESSURE KIT) DEVI Use as directed to check blood pressure daily dx: I10 Patient not taking: Reported on 08/12/2020 01/20/20   [name], FNP  citalopram (CELEXA) 10 MG tablet Take 1 tablet (10 mg total) by mouth daily. Patient not taking: No sig reported 07/14/20 07/14/21  [name], FNP  megestrol (MEGACE) 20 MG tablet Take 1 tablet (20 mg total) by mouth daily. Patient not taking: No sig reported 07/22/20   [name], FNP     Current Facility-Administered Medications  Medication Dose Route Frequency Provider Last Rate Last Admin  . acetaminophen (TYLENOL) tablet 650 mg  650 mg Oral Q6H PRN Rise Patience, MD       Or  . acetaminophen (TYLENOL) suppository 650 mg  650 mg Rectal Q6H PRN Rise Patience, MD      . amLODipine (NORVASC) tablet 7.5 mg  7.5 mg Oral Daily Ansley, Carole N, DO   7.5 mg at 08/22/20 0809  .  apixaban (ELIQUIS) tablet 5 mg  5 mg Oral BID Irene Pap N, DO   5 mg at 08/22/20 0810  . aspirin chewable tablet 81 mg  81 mg Oral Daily Kayleen Memos, DO   81 mg at 08/22/20 2563  . atorvastatin (LIPITOR) tablet 80 mg  80 mg Oral Daily Irene Pap N, DO   80 mg at 08/22/20 0810  . bisacodyl (DULCOLAX) suppository 10 mg  10 mg Rectal Once Kayleen Memos, DO      . citalopram (CELEXA) tablet 10 mg  10 mg Oral Daily Rise Patience, MD   10 mg at 08/22/20 8937  . dicyclomine (BENTYL) capsule 10 mg  10 mg Oral TID AC & HS Hall, Carole N, DO   10 mg at 08/22/20 0754  . doxycycline (VIBRA-TABS) tablet 100 mg  100 mg Oral Q12H Hall, Carole N, DO   100 mg at 08/22/20 0809  . metoprolol tartrate (LOPRESSOR) tablet 25 mg  25 mg Oral BID Irene Pap N, DO   25 mg at 08/22/20 0810  . oxyCODONE (Oxy IR/ROXICODONE) immediate release tablet 5 mg  5 mg Oral Q4H PRN Irene Pap N, DO   5 mg at 08/22/20 0754  . pantoprazole (PROTONIX) EC tablet 40 mg  40 mg Oral Daily Irene Pap N, DO   40 mg at 08/22/20 3428  . polyethylene glycol (MIRALAX / GLYCOLAX) packet 17 g  17 g Oral Daily Irene Pap N, DO   17 g at 08/22/20 7681  . senna-docusate (Senokot-S) tablet 2 tablet  2 tablet Oral BID Irene Pap N, DO   2 tablet at 08/22/20 0810  . traMADol (ULTRAM) tablet 50 mg  50 mg Oral Q6H PRN Irene Pap N, DO   50 mg at 08/21/20 1840    Allergies as of 08/18/2020 - Review Complete 08/18/2020  Allergen Reaction Noted  . Clonidine derivatives Other (See Comments) 12/26/2015  . Spironolactone Rash  12/02/2014     Review of Systems:    Constitutional: No weight loss, fever or chills Skin: No rash  Cardiovascular: No chest pain   Respiratory: No SOB Gastrointestinal: See HPI and otherwise negative Genitourinary: No dysuria Neurological: No headache, dizziness or syncope Musculoskeletal: No new muscle or joint pain Hematologic: No bleeding  Psychiatric: No history of depression or anxiety    Physical Exam:  Vital signs in last 24 hours: Temp:  [98.4 F (36.9 C)-98.9 F (37.2 C)] 98.6 F (37 C) (01/09 0344) Pulse Rate:  [57-63] 57 (01/09 0344) Resp:  [14-17] 14 (01/09 0344) BP: (102-135)/(67-99) 118/93 (01/09 0344) SpO2:  [94 %-97 %] 96 % (01/09 0344) Last BM Date: 08/20/20 General:   Pleasant AA female appears to be in NAD, Well developed, Well nourished, alert and cooperative Head:  Normocephalic and atraumatic. Eyes:   PEERL, EOMI. No icterus. Conjunctiva pink. Ears:  Normal auditory acuity. Neck:  Supple Throat: Oral cavity and pharynx without inflammation, swelling or lesion.  Lungs: Respirations even and unlabored. Lungs clear to auscultation bilaterally.   No wheezes, crackles, or rhonchi.  Heart: Normal S1, S2. No MRG. Regular rate and rhythm. No peripheral edema, cyanosis or pallor.  Abdomen:  Soft, nondistended,extremely tender to only light palpation in the RLQ/right side of abdomen. No rebound or guarding. Normal bowel sounds. No appreciable masses or hepatomegaly. Rectal:  Not performed.  Msk:  Symmetrical without gross deformities. Peripheral pulses intact.  Extremities:  Without edema, no deformity or joint abnormality.  Neurologic:  Alert and  oriented x4;  grossly normal neurologically.  Skin:   Dry and intact without significant lesions or rashes. Psychiatric: Demonstrates good judgement and reason without abnormal affect or behaviors.   LAB RESULTS: Recent Labs    08/21/20 0554  WBC 6.1  HGB 10.2*  HCT 33.5*  PLT 199   BMET Recent Labs     08/20/20 0811 08/21/20 0554  NA 138 137  K 4.4 4.2  CL 107 105  CO2 23 21*  GLUCOSE 79 112*  BUN 20 17  CREATININE 1.06* 0.97  CALCIUM 8.9 9.0   LFT Recent Labs    08/21/20 0554  PROT 6.0*  ALBUMIN 3.5  AST 20  ALT 22  ALKPHOS 97  BILITOT 0.6     Impression / Plan:   Impression: 1.  Right-sided abdominal pain: Patient does feel some better with Bentyl use, discussed negative CTAP and recently normal colonoscopy; consider IBS versus abdominal wall pain versus other 2.  A. fib with RVR: Rate is now controlled, patient has been started on a blood thinner  Plan: 1.  Discussed with the patient what her options are.  She would like to go home.  Explained that she can stay on the Dicyclomine 10 mg 3 times daily before meals and nightly and we will get her set up for an outpatient appointment with Dr. Benson Norway as that is who she would like to continue following with. 2.  Would also recommend the patient stay on a laxative such as MiraLAX daily or twice daily at time of discharge to ensure that constipation is not aggravating her abdominal pain. 3.  Please await any final recommendations from Dr. Hilarie Fredrickson later today.  We will sign off.  Thank you for your kind consultation.  Kimberly [name]  08/22/2020, 10:29 AM

## 2020-08-22 NOTE — Discharge Summary (Addendum)
Discharge Summary  Kimberly [name] VQX:[id number] DOB: Dec 19, 1955  PCP: Kimberly Brine, FNP  Admit date: 08/18/2020 Discharge date: 08/22/2020  Time spent: 35 minutes  Recommendations for Outpatient Follow-up:  1. Follow-up with GI Dr. Benson [name] 2. Follow-up with cardiology 3. Follow-up with PCP 4. Take your medicine PICC line  Discharge Diagnoses:  Active Hospital Problems   Diagnosis Date Noted  . Atrial fibrillation with RVR ([place]) 02/13/2018  . Right sided abdominal pain   . Abdominal wall pain   . CAD, ARTERY BYPASS GRAFT 05/03/2009  . Essential hypertension 04/23/2007    Resolved Hospital Problems  No resolved problems to display.    Discharge Condition: Stable  Diet recommendation: Resume previous diet  Vitals:   08/22/20 0932 08/22/20 1035  BP: 111/81 114/87  Pulse: (!) 55 (!) 56  Resp: 14 16  Temp: 98.7 F (37.1 C) 98.1 F (36.7 C)  SpO2: 98% 96%    History of present illness:  and aneurysm presents to the ER with complaint of right Upper quadrant and right lower quadrant abdominal pain. Associated with intermittent nausea and vomiting.  Denied diarrhea but had constipation.  She denied chest pain or shortness of breath.    ED Course:In the ER CT abdomen pelvis did not show any acute intra-abdominal abnormalities. After receiving pain medications her abdominal pain resolved.  She was incidentally found to be in A. fib with RVR for which she was started on Cardizem drip and cardiology was consulted.  Patient was admitted for further management of A. fib with RVR and intractable abdominal pain. Covid test was negative.  She converted to sinus rhythm on 08/20/20.  Cardizem drip was discontinued and she was started on p.o. Lopressor 25 mg twice daily and Eliquis for secondary CVA prevention by cardiology. TSH was normal 0.897. Her 2D echo was completed on 08/19/2020 showed normal LVEF 65 to 70%.  GI was consulted due to intractable abdominal pain.  She was started  on Bentyl with improvement of her symptomatology.  Per GI, abdominal pain is likely secondary to abdominal wall pain versus IBS.  Patient advised to follow-up with her gastroenterologist Dr. Benson [name] and to take her medications as prescribed.  She is agreeable.   The writer called her sister Kimberly [name], gave updates and informed her that the patient will need to follow-up with GI Dr. Benson [name] posthospitalization.  She understands and agrees with plan.  08/22/20: Patient was seen and examined at her bedside this morning.  Reports her pain is mildly improved on Bentyl.  She is tolerating a diet well.  No longer constipated.  Her last bowel movement was last night.   Hospital Course:  Principal Problem:   Atrial fibrillation with RVR ([facility]) Active Problems:   Essential hypertension   CAD, ARTERY BYPASS GRAFT   Right sided abdominal pain   Abdominal wall pain  Diffuse abdominal pain greater at right upper, mid, and right lower quadrants, unclear etiology Seen by GI, possible abdominal wall pain versus IBS versus other. Patient agreeable to follow-up with her gastroenterologist Dr. Benson [name]. Patient has a history of colonoscopy 7 months ago with Dr. Benson [name] which showed sigmoid diverticulosis and a few nonbleeding colonic angiodysplastic lesions. She had a CT scan of the abdomen and pelvis with contrast done on 08/19/2020 which showed no acute abdominal or pelvic process.  There was a focal dilatation of the infrarenal abdominal aorta with aortic atherosclerosis.  Her LFTs have been normal, lipase normal. Started on Bentyl on 08/21/2020 with some improvement of  her symptomatology, continue as recommended by GI Avoid constipation, continue MiraLAX daily Pain control Follow-up with GI Dr. Benson [name] within a week.  STI? Patient self suspected sexually transmitted infection as a cause of her abdominal pain She denied vaginal discharge No clear evidence of active infective process Received p.o. doxycycline on 08/21/2020  presumptively Patient declined testing, further labs, or work-up inpatient on 08/21/20 Will defer further evaluation to her PCP  Follow-up with your PCP within a week  Microcytic anemia, chronic Hemoglobin 10.2 with MCV 71 Iron studies suggestive of mild iron deficiency Started ferrous sulfate 325 mg daily No overt bleeding Follow-up with GI Dr. Benson [name]  Abnormal twelve-lead EKG Read as STEMI on 08/31/2020 due to slurring of ST in lead III but no other leads, otherwise no changes in deep T wave inversion in lateral. Troponin downtrended 110 from 153. Reviewed by cardiology Okay to discharge per cardiology from a cardiac standpoint  Paroxysmal A. fib with RVR, rate is now controlled Initially requiring diltiazem drip Currently on Lopressor 25 mg twice daily On Eliquis for secondary CVA prevention CHA2DS2-VASc of 5, continue Eliquis as recommended by cardiology Follow-up with cardiology in 1 to 2 weeks  Elevated troponin likely demand ischemia in the setting of A. fib with RVR Seen by cardiology 2D echo completed showed LVEF 65 to 70% with normal wall motion and mild left ventricular hypertrophy. Troponin downtrended.  Ascending aortic aneurysm Currently measuring 4.2 cm Continue monitoring as outpatient Annual imaging is recommended.  Dilatation of infrarenal abdominal aorta CT abdomen and pelvis showed focal dilatation of the infrarenal abdominal aorta measuring up to 2.8 cm Follow-up ultrasound recommended in 5 years.  Essential hypertension BP is currently at goal Continue Lopressor 25 mg twice daily and amlodipine 7.5 mg daily. Follow-up with your PCP within a week.  Hyperlipidemia Continue Lipitor 80 mg daily  Chronic anxiety/depression Continue home Celexa  GERD Continue Protonix 40 mg daily.  Tobacco use disorder Tobacco cessation counseling done at bedside.  Physical debility PT assessment recommended home health PT Continue PT with assistance and  fall precautions   Code Status: Full code  Family Communication:  Updated her sister Kimberly [name] on 08/22/2020, updating her sister Kimberly [name] and daughter on 08/21/2020.     Consultants:  Cardiology, signed off on 08/20/2020.  GI, signed off on 08/22/2020.  Procedures:  2D echo    DVT prophylaxis: Eliquis.   Discharge Exam: BP 114/87 (BP Location: Left Arm)   Pulse (!) 56   Temp 98.1 F (36.7 C) (Oral)   Resp 16   Ht 5' 7"  (1.702 m)   Wt 51.3 kg   SpO2 96%   BMI 17.70 kg/m  . General: 65 y.o. year-old female well developed well nourished in no acute distress.  Alert and oriented x3. . Cardiovascular: Regular rate and rhythm with no rubs or gallops.  No thyromegaly or JVD noted.   [name] Respiratory: Clear to auscultation with no wheezes or rales. Good inspiratory effort. . Abdomen: Soft diffuse discomfort with palpation worse on right upper, mid and right lower quadrants.  Bowel sounds present.  Nondistended. . Musculoskeletal: No lower extremity edema bilaterally.   . Skin: No ulcerative lesions noted or rashes . Psychiatry: Mood is appropriate for condition and setting  Discharge Instructions You were cared for by a hospitalist during your hospital stay. If you have any questions about your discharge medications or the care you received while you were in the hospital after you are discharged, you can call the unit and  asked to speak with the hospitalist on call if the hospitalist that took care of you is not available. Once you are discharged, your primary care physician will handle any further medical issues. Please note that NO REFILLS for any discharge medications will be authorized once you are discharged, as it is imperative that you return to your primary care physician (or establish a relationship with a primary care physician if you do not have one) for your aftercare needs so that they can reassess your need for medications and monitor your lab values.  Discharge  Instructions    Ambulatory referral to Physical Therapy   Complete by: As directed      Allergies as of 08/22/2020      Reactions   Clonidine Derivatives Other (See Comments)   Patch causes scars   Spironolactone Rash      Medication List    STOP taking these medications   citalopram 10 MG tablet Commonly known as: CeleXA   megestrol 20 MG tablet Commonly known as: MEGACE   Mitigare 0.6 MG Caps Generic drug: Colchicine     TAKE these medications   amLODipine 2.5 MG tablet Commonly known as: NORVASC Take 3 tablets (7.5 mg total) by mouth daily. Start taking on: August 23, 2020 What changed:   medication strength  how much to take  how to take this  when to take this  additional instructions   apixaban 5 MG Tabs tablet Commonly known as: Eliquis Take 1 tablet (5 mg total) by mouth 2 (two) times daily.   aspirin 81 MG EC tablet Commonly known as: QC LO-DOSE ASPIRIN Take 1 tablet (81 mg total) by mouth daily. Swallow whole.   atorvastatin 80 MG tablet Commonly known as: LIPITOR TAKE 1 TABLET BY MOUTH DAILY   Blood Pressure Kit Devi Use as directed to check blood pressure daily dx: I10   dicyclomine 10 MG capsule Commonly known as: BENTYL Take 1 capsule (10 mg total) by mouth 4 (four) times daily -  before meals and at bedtime for 10 days.   ferrous sulfate 325 (65 FE) MG tablet Take 1 tablet (325 mg total) by mouth daily with breakfast.   meclizine 25 MG tablet Commonly known as: ANTIVERT Take 1 tablet (25 mg total) by mouth 3 (three) times daily as needed for dizziness.   metoprolol tartrate 25 MG tablet Commonly known as: LOPRESSOR Take 1 tablet (25 mg total) by mouth 2 (two) times daily.   multivitamin capsule Take 1 capsule by mouth daily.   omeprazole 20 MG capsule Commonly known as: PRILOSEC Take 1 capsule (20 mg total) by mouth 2 (two) times daily before a meal.   polyethylene glycol 17 g packet Commonly known as: MIRALAX /  GLYCOLAX Take 17 g by mouth daily. Start taking on: August 23, 2020   traMADol 50 MG tablet Commonly known as: ULTRAM Take 1 tablet (50 mg total) by mouth every 6 (six) hours as needed for up to 3 days for moderate pain.      Allergies  Allergen Reactions  . Clonidine Derivatives Other (See Comments)    Patch causes scars  . Spironolactone Rash    Follow-up Information    Darreld Mclean, PA-C Follow up.   Specialties: Physician Assistant, Cardiology Why: Hospital follow-up with Cardiology scheduled for 09/13/2020 at 9:45am. Please arrive 15 minutes early for check-in. If this date/time does not work for you, please call our office to reschedule. Contact information: [place]  [place] [address] [phone]        Carol Ada, MD. Call in 1 day(s).   Specialty: Gastroenterology Why: Please call for a post hospital follow-up appointment. Contact information: [place], [place] [address] [place], [place], [place]. Call in 1 day(s).   Specialty: General Practice Why: Please call for a post hospital follow-up appointment. Contact information: [place] [address] [phone]        [name], MD .   Specialty: Cardiology Contact information: [address] [place] [address] [phone]        [facility] Follow up.   Specialty: Rehabilitation Why: they will contact you to schedule an appointment for therapy.  Contact information: [address] [id number] [place] [phone]               The results of significant diagnostics from this hospitalization (including imaging, microbiology, ancillary and laboratory) are listed below for reference.    Significant Diagnostic Studies: DG Chest 2 View  Result Date: 08/18/2020 CLINICAL DATA:  Chest pain.  Abdominal pain.  EXAM: CHEST - 2 VIEW COMPARISON:  04/25/2020. FINDINGS: Prior CABG. Stable cardiomegaly. No pulmonary venous congestion. No focal infiltrate. No pleural effusion or pneumothorax. Degenerative changes scoliosis thoracic spine. IMPRESSION: 1. Prior CABG. Stable cardiomegaly. 2. No acute pulmonary disease. Electronically Signed   By: Marcello Moores  Register   On: 08/18/2020 10:55   CT ABDOMEN PELVIS W CONTRAST  Result Date: 08/19/2020 CLINICAL DATA:  Right-sided abdominal pain, polyuria EXAM: CT ABDOMEN AND PELVIS WITH CONTRAST TECHNIQUE: Multidetector CT imaging of the abdomen and pelvis was performed using the standard protocol following bolus administration of intravenous contrast. CONTRAST:  167m OMNIPAQUE IOHEXOL 300 MG/ML  SOLN COMPARISON:  02/24/2009 FINDINGS: Lower chest: No acute pleural or parenchymal lung disease. Hepatobiliary: No focal liver abnormality is seen. No gallstones, gallbladder wall thickening, or biliary dilatation. Pancreas: Unremarkable. No pancreatic ductal dilatation or surrounding inflammatory changes. Spleen: Normal in size without focal abnormality. Adrenals/Urinary Tract: Multiple areas of left renal cortical scarring. Right kidney enhances normally. No urinary tract calculi or obstructive uropathy. The adrenals are normal. Bladder is decompressed, which limits its evaluation. Stomach/Bowel: No bowel obstruction or ileus. No bowel wall thickening or inflammatory change. Vascular/Lymphatic: Diffuse atherosclerosis of the aorta. 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 No dissection. No pathologic adenopathy. Reproductive: Uterus is atrophic. Calcified fibroid within the uterine fundus. No adnexal masses. Other: No free fluid or free intraperitoneal gas. No abdominal wall hernia. Musculoskeletal: No acute or destructive bony lesions. Reconstructed images demonstrate no additional findings. IMPRESSION: 1. No acute intra-abdominal or intrapelvic process. 2.  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 Recommend follow-up ultrasound every 5 years. This recommendation follows ACR consensus guidelines: White Paper of the ACR Incidental Findings Committee II on Vascular Findings. J Am Coll Radiol 2013;; [id number]:[id number] 3. Aortic Atherosclerosis (ICD10-I70.0). Electronically Signed   By: MRanda NgoM.D.   On: 08/19/2020 00:57   ECHOCARDIOGRAM COMPLETE  Result Date: 08/19/2020    ECHOCARDIOGRAM REPORT   Patient Name:   Kimberly NAVESDate of Exam: 08/19/2020 Medical Rec #:  [id number]        Height:       67.0 in Accession #:    [id number]       Weight:       113.0  lb Date of Birth:  1955-08-20        BSA:          1.587 m Patient Age:    61 years          BP:           113/77 mmHg Patient Gender: F                 HR:           76 bpm. Exam Location:  Inpatient Procedure: 2D Echo Indications:    elevated troponin  History:        Patient has prior history of Echocardiogram examinations, most                 recent 02/14/2018. CAD, Arrythmias:Atrial Fibrillation; Risk                 Factors:Hypertension.  Sonographer:    Johny Chess Referring Phys: [id number] [place]  1. Left ventricular ejection fraction, by estimation, is 65 to 70%. The left ventricle has normal function. The left ventricle has no regional wall motion abnormalities. There is mild concentric left ventricular hypertrophy. Left ventricular diastolic function could not be evaluated.  2. Right ventricular systolic function is normal. The right ventricular size is normal. There is normal pulmonary artery systolic pressure. The estimated right ventricular systolic pressure is 89.3 mmHg.  3. Left atrial size was moderately dilated.  4. Right atrial size was mildly dilated.  5. The mitral valve is normal in structure. Mild mitral valve regurgitation. No evidence of mitral stenosis.  6. Tricuspid valve regurgitation is mild to moderate.  7. The aortic valve is  normal in structure. Aortic valve regurgitation is not visualized. No aortic stenosis is present.  8. Aortic dilatation noted. There is mild dilatation of the ascending aorta, measuring 42 mm.  9. The inferior vena cava is normal in size with greater than 50% respiratory variability, suggesting right atrial pressure of 3 mmHg. FINDINGS  Left Ventricle: Left ventricular ejection fraction, by estimation, is 65 to 70%. The left ventricle has normal function. The left ventricle has no regional wall motion abnormalities. The left ventricular internal cavity size was normal in size. There is  mild concentric left ventricular hypertrophy. Left ventricular diastolic function could not be evaluated due to atrial fibrillation. Left ventricular diastolic function could not be evaluated. Right Ventricle: The right ventricular size is normal. No increase in right ventricular wall thickness. Right ventricular systolic function is normal. There is normal pulmonary artery systolic pressure. The tricuspid regurgitant velocity is 1.91 m/s, and  with an assumed right atrial pressure of 3 mmHg, the estimated right ventricular systolic pressure is 73.4 mmHg. Left Atrium: Left atrial size was moderately dilated. Right Atrium: Right atrial size was mildly dilated. Pericardium: There is no evidence of pericardial effusion. Mitral Valve: The mitral valve is normal in structure. There is mild thickening of the mitral valve leaflet(s). Mild mitral valve regurgitation. No evidence of mitral valve stenosis. Tricuspid Valve: The tricuspid valve is normal in structure. Tricuspid valve regurgitation is mild to moderate. No evidence of tricuspid stenosis. Aortic Valve: The aortic valve is normal in structure. Aortic valve regurgitation is not visualized. No aortic stenosis is present. Pulmonic Valve: The pulmonic valve was normal in structure. Pulmonic valve regurgitation is not visualized. No evidence of pulmonic stenosis. Aorta: Aortic dilatation  noted. There is mild dilatation of the ascending aorta, measuring 42 mm. Venous: The inferior vena cava  is normal in size with greater than 50% respiratory variability, suggesting right atrial pressure of 3 mmHg. IAS/Shunts: No atrial level shunt detected by color flow Doppler.  LEFT VENTRICLE PLAX 2D LVIDd:         4.80 cm LVIDs:         3.70 cm LV PW:         0.80 cm LV IVS:        1.10 cm LVOT diam:     2.00 cm LV SV:         53 LV SV Index:   34 LVOT Area:     3.14 cm  RIGHT VENTRICLE         IVC TAPSE (M-mode): 1.6 cm  IVC diam: 1.80 cm LEFT ATRIUM             Index       RIGHT ATRIUM           Index LA diam:        3.80 cm 2.40 cm/m  RA Area:     18.50 cm LA Vol (A2C):   96.6 ml 60.89 ml/m RA Volume:   50.70 ml  31.96 ml/m LA Vol (A4C):   82.3 ml 51.87 ml/m LA Biplane Vol: 89.3 ml 56.29 ml/m  AORTIC VALVE LVOT Vmax:   94.00 cm/s LVOT Vmean:  56.400 cm/s LVOT VTI:    0.170 m  AORTA Ao Root diam: 3.20 cm Ao Asc diam:  4.20 cm TRICUSPID VALVE TV Peak grad:   19.5 mmHg TV Vmax:        2.21 m/s TR Peak grad:   14.6 mmHg TR Vmax:        191.00 cm/s  SHUNTS Systemic VTI:  0.17 m Systemic Diam: 2.00 cm Ena Dawley MD Electronically signed by Ena Dawley MD Signature Date/Time: 08/19/2020/7:49:19 PM    Final     Microbiology: Recent Results (from the past 240 hour(s))  Resp Panel by RT-PCR (Flu A&B, Covid) Nasopharyngeal Swab     Status: None   Collection Time: 08/19/20  3:54 AM   Specimen: Nasopharyngeal Swab; Nasopharyngeal(NP) swabs in vial transport medium  Result Value Ref Range Status   SARS Coronavirus 2 by RT PCR NEGATIVE NEGATIVE Final    Comment: (NOTE) SARS-CoV-2 target nucleic acids are NOT DETECTED.  The SARS-CoV-2 RNA is generally detectable in upper respiratory specimens during the acute phase of infection. The lowest concentration of SARS-CoV-2 viral copies this assay can detect is 138 copies/mL. A negative result does not preclude SARS-Cov-2 infection and should not be  used as the sole basis for treatment or other patient management decisions. A negative result may occur with  improper specimen collection/handling, submission of specimen other than nasopharyngeal swab, presence of viral mutation(s) within the areas targeted by this assay, and inadequate number of viral copies(<138 copies/mL). A negative result must be combined with clinical observations, patient history, and epidemiological information. The expected result is Negative.  Fact Sheet for Patients:  EntrepreneurPulse.com.au  Fact Sheet for Healthcare Providers:  IncredibleEmployment.be  This test is no t yet approved or cleared by the [place] FDA and  has been authorized for detection and/or diagnosis of SARS-CoV-2 by FDA under an Emergency Use Authorization (EUA). This EUA will remain  in effect (meaning this test can be used) for the duration of the COVID-19 declaration under Section 564(b)(1) of the Act, 21 U.S.C.section 360bbb-3(b)(1), unless the authorization is terminated  or revoked sooner.  Influenza A by PCR NEGATIVE NEGATIVE Final   Influenza B by PCR NEGATIVE NEGATIVE Final    Comment: (NOTE) The Xpert Xpress SARS-CoV-2/FLU/RSV plus assay is intended as an aid in the diagnosis of influenza from Nasopharyngeal swab specimens and should not be used as a sole basis for treatment. Nasal washings and aspirates are unacceptable for Xpert Xpress SARS-CoV-2/FLU/RSV testing.  Fact Sheet for Patients: EntrepreneurPulse.com.au  Fact Sheet for Healthcare Providers: IncredibleEmployment.be  This test is not yet approved or cleared by the [place] FDA and has been authorized for detection and/or diagnosis of SARS-CoV-2 by FDA under an Emergency Use Authorization (EUA). This EUA will remain in effect (meaning this test can be used) for the duration of the COVID-19 declaration under Section  564(b)(1) of the Act, 21 U.S.C. section 360bbb-3(b)(1), unless the authorization is terminated or revoked.  Performed at [facility], [place] [address], [place], [place] [address]      Labs: Basic Metabolic Panel: Recent Labs  Lab 08/18/20 1027 08/19/20 0911 08/19/20 1326 08/20/20 0811 08/21/20 0554  NA 142 141  --  138 137  K 3.5 3.2*  --  4.4 4.2  CL 107 107  --  107 105  CO2 25 24  --  23 21*  GLUCOSE 77 102*  --  79 112*  BUN 10 18  --  20 17  CREATININE 0.86 1.00  --  1.06* 0.97  CALCIUM 9.9 9.1  --  8.9 9.0  MG  --   --  1.6* 2.4  --    Liver Function Tests: Recent Labs  Lab 08/18/20 1027 08/19/20 0911 08/21/20 0554  AST 27 23 20   ALT 30 26 22   ALKPHOS 124 110 97  BILITOT 0.7 0.6 0.6  PROT 7.8 6.3* 6.0*  ALBUMIN 4.4 3.5 3.5   Recent Labs  Lab 08/18/20 1027 08/21/20 0554  LIPASE 18 17   No results for input(s): AMMONIA in the last 168 hours. CBC: Recent Labs  Lab 08/18/20 1027 08/19/20 0911 08/21/20 0554  WBC 7.2 6.2 6.1  NEUTROABS  --  3.3 3.2  HGB 11.7* 11.1* 10.2*  HCT 39.0 36.2 33.5*  MCV 72.1* 71.3* 71.4*  PLT 229 214 199   Cardiac Enzymes: No results for input(s): CKTOTAL, CKMB, CKMBINDEX, TROPONINI in the last 168 hours. BNP: BNP (last 3 results) No results for input(s): BNP in the last 8760 hours.  ProBNP (last 3 results) No results for input(s): PROBNP in the last 8760 hours.  CBG: No results for input(s): GLUCAP in the last 168 hours.     Signed:  Kayleen Memos, MD [facility] 08/22/2020, 2:25 PM

## 2020-08-22 NOTE — Discharge Instructions (Signed)
Atrial Fibrillation  Atrial fibrillation is a type of heartbeat that is irregular or fast. If you have this condition, your heart beats without any order. This makes it hard for your heart to pump blood in a normal way. Atrial fibrillation may come and go, or it may become a long-lasting problem. If this condition is not treated, it can put you at higher risk for stroke, heart failure, and other heart problems. What are the causes? This condition may be caused by diseases that damage the heart. They include:  High blood pressure.  Heart failure.  Heart valve disease.  Heart surgery. Other causes include:  Diabetes.  Thyroid disease.  Being overweight.  Kidney disease. Sometimes the cause is not known. What increases the risk? You are more likely to develop this condition if:  You are older.  You smoke.  You exercise often and very hard.  You have a family history of this condition.  You are a man.  You use drugs.  You drink a lot of alcohol.  You have lung conditions, such as emphysema, pneumonia, or COPD.  You have sleep apnea. What are the signs or symptoms? Common symptoms of this condition include:  A feeling that your heart is beating very fast.  Chest pain or discomfort.  Feeling short of breath.  Suddenly feeling light-headed or weak.  Getting tired easily during activity.  Fainting.  Sweating. In some cases, there are no symptoms. How is this treated? Treatment for this condition depends on underlying conditions and how you feel when you have atrial fibrillation. They include:  Medicines to: ? Prevent blood clots. ? Treat heart rate or heart rhythm problems.  Using devices, such as a pacemaker, to correct heart rhythm problems.  Doing surgery to remove the part of the heart that sends bad signals.  Closing an area where clots can form in the heart (left atrial appendage). In some cases, your doctor will treat other underlying  conditions. Follow these instructions at home: Medicines  Take over-the-counter and prescription medicines only as told by your doctor.  Do not take any new medicines without first talking to your doctor.  If you are taking blood thinners: ? Talk with your doctor before you take any medicines that have aspirin or NSAIDs, such as ibuprofen, in them. ? Take your medicine exactly as told by your doctor. Take it at the same time each day. ? Avoid activities that could hurt or bruise you. Follow instructions about how to prevent falls. ? Wear a bracelet that says you are taking blood thinners. Or, carry a card that lists what medicines you take. Lifestyle      Do not use any products that have nicotine or tobacco in them. These include cigarettes, e-cigarettes, and chewing tobacco. If you need help quitting, ask your doctor.  Eat heart-healthy foods. Talk with your doctor about the right eating plan for you.  Exercise regularly as told by your doctor.  Do not drink alcohol.  Lose weight if you are overweight.  Do not use drugs, including cannabis. General instructions  If you have a condition that causes breathing to stop for a short period of time (apnea), treat it as told by your doctor.  Keep a healthy weight. Do not use diet pills unless your doctor says they are safe for you. Diet pills may make heart problems worse.  Keep all follow-up visits as told by your doctor. This is important. Contact a doctor if:  You notice a change   in the speed, rhythm, or strength of your heartbeat.  You are taking a blood-thinning medicine and you get more bruising.  You get tired more easily when you move or exercise.  You have a sudden change in weight. Get help right away if:   You have pain in your chest or your belly (abdomen).  You have trouble breathing.  You have side effects of blood thinners, such as blood in your vomit, poop (stool), or pee (urine), or bleeding that cannot  stop.  You have any signs of a stroke. "BE FAST" is an easy way to remember the main warning signs: ? B - Balance. Signs are dizziness, sudden trouble walking, or loss of balance. ? E - Eyes. Signs are trouble seeing or a change in how you see. ? F - Face. Signs are sudden weakness or loss of feeling in the face, or the face or eyelid drooping on one side. ? A - Arms. Signs are weakness or loss of feeling in an arm. This happens suddenly and usually on one side of the body. ? S - Speech. Signs are sudden trouble speaking, slurred speech, or trouble understanding what people say. ? T - Time. Time to call emergency services. Write down what time symptoms started.  You have other signs of a stroke, such as: ? A sudden, very bad headache with no known cause. ? Feeling like you may vomit (nausea). ? Vomiting. ? A seizure. These symptoms may be an emergency. Do not wait to see if the symptoms will go away. Get medical help right away. Call your local emergency services (911 in the [place] Do not drive yourself to the hospital. Summary  Atrial fibrillation is a type of heartbeat that is irregular or fast.  You are at higher risk of this condition if you smoke, are older, have diabetes, or are overweight.  Follow your doctor's instructions about medicines, diet, exercise, and follow-up visits.  Get help right away if you have signs or symptoms of a stroke.  Get help right away if you cannot catch your breath, or you have chest pain or discomfort. This information is not intended to replace advice given to you by your health care provider. Make sure you discuss any questions you have with your health care provider. Document Revised: 01/22/2019 Document Reviewed: 01/22/2019 Elsevier Patient Education  [place].  Irritable Bowel Syndrome, Adult  Irritable bowel syndrome (IBS) is a group of symptoms that affects the organs responsible for digestion (gastrointestinal or GI tract). IBS is  not one specific disease. To regulate how the GI tract works, the body sends signals back and forth between the intestines and the brain. If you have IBS, there may be a problem with these signals. As a result, the GI tract does not function normally. The intestines may become more sensitive and overreact to certain things. This may be especially true when you eat certain foods or when you are under stress. There are four types of IBS. These may be determined based on the consistency of your stool (feces):  IBS with diarrhea.  IBS with constipation.  Mixed IBS.  Unsubtyped IBS. It is important to know which type of IBS you have. Certain treatments are more likely to be helpful for certain types of IBS. What are the causes? The exact cause of IBS is not known. What increases the risk? You may have a higher risk for IBS if you:  Are female.  Are younger than 53.  Have  a family history of IBS.  Have a mental health condition, such as depression, anxiety, or post-traumatic stress disorder.  Have had a bacterial infection of your GI tract. What are the signs or symptoms? Symptoms of IBS vary from person to person. The main symptom is abdominal pain or discomfort. Other symptoms usually include one or more of the following:  Diarrhea, constipation, or both.  Abdominal swelling or bloating.  Feeling full after eating a small or regular-sized meal.  Frequent gas.  Mucus in the stool.  A feeling of having more stool left after a bowel movement. Symptoms tend to come and go. They may be triggered by stress, mental health conditions, or certain foods. How is this diagnosed? This condition may be diagnosed based on a physical exam, your medical history, and your symptoms. You may have tests, such as:  Blood tests.  Stool test.  X-rays.  CT scan.  Colonoscopy. This is a procedure in which your GI tract is viewed with a long, thin, flexible tube. How is this treated? There is  no cure for IBS, but treatment can help relieve symptoms. Treatment depends on the type of IBS you have, and may include:  Changes to your diet, such as: ? Avoiding foods that cause symptoms. ? Drinking more water. ? Following a low-FODMAP (fermentable oligosaccharides, disaccharides, monosaccharides, and polyols) diet for up to 6 weeks, or as told by your health care provider. FODMAPs are sugars that are hard for some people to digest. ? Eating more fiber. ? Eating medium-sized meals at the same times every day.  Medicines. These may include: ? Fiber supplements, if you have constipation. ? Medicine to control diarrhea (antidiarrheal medicines). ? Medicine to help control muscle tightening (spasms) in your GI tract (antispasmodic medicines). ? Medicines to help with mental health conditions, such as antidepressants or tranquilizers.  Talk therapy or counseling.  Working with a diet and nutrition specialist (dietitian) to help create a food plan that is right for you.  Managing your stress. Follow these instructions at home: Eating and drinking  Eat a healthy diet.  Eat medium-sized meals at about the same time every day. Do not eat large meals.  Gradually eat more fiber-rich foods. These include whole grains, fruits, and vegetables. This may be especially helpful if you have IBS with constipation.  Eat a diet low in FODMAPs.  Drink enough fluid to keep your urine pale yellow.  Keep a journal of foods that seem to trigger symptoms.  Avoid foods and drinks that: ? Contain added sugar. ? Make your symptoms worse. Dairy products, caffeinated drinks, and carbonated drinks can make symptoms worse for some people. General instructions  Take over-the-counter and prescription medicines and supplements only as told by your health care provider.  Get enough exercise. Do at least 150 minutes of moderate-intensity exercise each week.  Manage your stress. Getting enough sleep and  exercise can help you manage stress.  Keep all follow-up visits as told by your health care provider and therapist. This is important. Alcohol Use  Do not drink alcohol if: ? Your health care provider tells you not to drink. ? You are pregnant, may be pregnant, or are planning to become pregnant.  If you drink alcohol, limit how much you have: ? 0-1 drink a day for women. ? 0-2 drinks a day for men.  Be aware of how much alcohol is in your drink. In the [place] one drink equals one typical bottle of beer (12 oz), one-half glass  of wine (5 oz), or one shot of hard liquor (1 oz). Contact a health care provider if you have:  Constant pain.  Weight loss.  Difficulty or pain when swallowing.  Diarrhea that gets worse. Get help right away if you have:  Severe abdominal pain.  Fever.  Diarrhea with symptoms of dehydration, such as dizziness or dry mouth.  Bright red blood in your stool.  Stool that is black and tarry.  Abdominal swelling.  Vomiting that does not stop.  Blood in your vomit. Summary  Irritable bowel syndrome (IBS) is not one specific disease. It is a group of symptoms that affects digestion.  Your intestines may become more sensitive and overreact to certain things. This may be especially true when you eat certain foods or when you are under stress.  There is no cure for IBS, but treatment can help relieve symptoms. This information is not intended to replace advice given to you by your health care provider. Make sure you discuss any questions you have with your health care provider. Document Revised: 07/24/2017 Document Reviewed: 07/24/2017 Elsevier Patient Education  2020 [place].  Diet for Irritable Bowel Syndrome When you have irritable bowel syndrome (IBS), it is very important to eat the foods and follow the eating habits that are best for your condition. IBS may cause various symptoms such as pain in the abdomen, constipation, or diarrhea.  Choosing the right foods can help to ease the discomfort from these symptoms. Work with your health care provider and diet and nutrition specialist (dietitian) to find the eating plan that will help to control your symptoms. What are tips for following this plan?      Keep a food diary. This will help you identify foods that cause symptoms. Write down: ? What you eat and when you eat it. ? What symptoms you have. ? When symptoms occur in relation to your meals, such as "pain in abdomen 2 hours after dinner."  Eat your meals slowly and in a relaxed setting.  Aim to eat 5-6 small meals per day. Do not skip meals.  Drink enough fluid to keep your urine pale yellow.  Ask your health care provider if you should take an over-the-counter probiotic to help restore healthy bacteria in your gut (digestive tract). ? Probiotics are foods that contain good bacteria and yeasts.  Your dietitian may have specific dietary recommendations for you based on your symptoms. He or she may recommend that you: ? Avoid foods that cause symptoms. Talk with your dietitian about other ways to get the same nutrients that are in those problem foods. ? Avoid foods with gluten. Gluten is a protein that is found in rye, wheat, and barley. ? Eat more foods that contain soluble fiber. Examples of foods with high soluble fiber include oats, seeds, and certain fruits and vegetables. Take a fiber supplement if directed by your dietitian. ? Reduce or avoid certain foods called FODMAPs. These are foods that contain carbohydrates that are hard to digest. Ask your doctor which foods contain these carbohydrates. What foods are not recommended? The following are some foods and drinks that may make your symptoms worse:  Fatty foods, such as french fries.  Foods that contain gluten, such as pasta and cereal.  Dairy products, such as milk, cheese, and ice cream.  Chocolate.  Alcohol.  Products with caffeine, such as  coffee.  Carbonated drinks, such as soda.  Foods that are high in FODMAPs. These include certain fruits and vegetables.  Products with sweeteners such as honey, high fructose corn syrup, sorbitol, and mannitol. The items listed above may not be a complete list of foods and beverages you should avoid. Contact a dietitian for more information. What foods are good sources of fiber? Your health care provider or dietitian may recommend that you eat more foods that contain fiber. Fiber can help to reduce constipation and other IBS symptoms. Add foods with fiber to your diet a little at a time so your body can get used to them. Too much fiber at one time might cause gas and swelling of your abdomen. The following are some foods that are good sources of fiber:  Berries, such as raspberries, strawberries, and blueberries.  Tomatoes.  Carrots.  Brown rice.  Oats.  Seeds, such as chia and pumpkin seeds. The items listed above may not be a complete list of recommended sources of fiber. Contact your dietitian for more options. Where to find more information  International Foundation for Functional Gastrointestinal Disorders: www.iffgd.[employer] of Diabetes and Digestive and Kidney Diseases: [employer] Summary  When you have irritable bowel syndrome (IBS), it is very important to eat the foods and follow the eating habits that are best for your condition.  IBS may cause various symptoms such as pain in the abdomen, constipation, or diarrhea.  Choosing the right foods can help to ease the discomfort that comes from symptoms.  Keep a food diary. This will help you identify foods that cause symptoms.  Your health care provider or diet and nutrition specialist (dietitian) may recommend that you eat more foods that contain fiber. This information is not intended to replace advice given to you by your health care provider. Make sure you discuss any questions you have with your  health care provider. Document Revised: 11/20/2018 Document Reviewed: 04/03/2017 Elsevier Patient Education  [place].

## 2020-08-22 NOTE — TOC Transition Note (Signed)
Transition of Care [facility]) - CM/SW Discharge Note   Patient Details  Name: Kimberly [name] MRN: [id number] Date of Birth: [birth year]  [facility] of [facility]) CM/SW Contact:  Carles Collet, RN Phone Number: 08/22/2020, 1:55 PM   Clinical Narrative:    Unable to secure [facility] services. Patient agreeable to outpatient PT.  Patient has [facility], [facility] services 2 hours a day.  Referral made to [facility].     Final next level of care: Home/Self Care Barriers to Discharge: No Barriers Identified   Patient Goals and CMS Choice Patient states their goals for this hospitalization and ongoing recovery are:: to go home      Discharge Placement                       Discharge Plan and Services                                     Social Determinants of Health (SDOH) Interventions     Readmission Risk Interventions No flowsheet data found.

## 2020-08-23 ENCOUNTER — Encounter: Payer: Self-pay | Admitting: Nurse Practitioner

## 2020-08-23 ENCOUNTER — Ambulatory Visit: Payer: Medicaid Other | Admitting: Nurse Practitioner

## 2020-08-23 ENCOUNTER — Other Ambulatory Visit (HOSPITAL_COMMUNITY)
Admission: RE | Admit: 2020-08-23 | Discharge: 2020-08-23 | Disposition: A | Discharge status: Home / Self Care | Payer: Medicaid Other | Source: Ambulatory Visit | Attending: Nurse Practitioner | Admitting: Nurse Practitioner

## 2020-08-23 ENCOUNTER — Other Ambulatory Visit: Payer: Self-pay

## 2020-08-23 VITALS — BP 112/80 | HR 61 | Temp 98.3°F

## 2020-08-23 DIAGNOSIS — I4891 Unspecified atrial fibrillation: Secondary | ICD-10-CM | POA: Diagnosis not present

## 2020-08-23 DIAGNOSIS — Z993 Dependence on wheelchair: Secondary | ICD-10-CM

## 2020-08-23 DIAGNOSIS — N760 Acute vaginitis: Secondary | ICD-10-CM | POA: Diagnosis not present

## 2020-08-23 DIAGNOSIS — I712 Thoracic aortic aneurysm, without rupture: Secondary | ICD-10-CM

## 2020-08-23 DIAGNOSIS — R1084 Generalized abdominal pain: Secondary | ICD-10-CM | POA: Diagnosis not present

## 2020-08-23 DIAGNOSIS — I7121 Aneurysm of the ascending aorta, without rupture: Secondary | ICD-10-CM

## 2020-08-23 NOTE — Progress Notes (Signed)
I,Kimberly [name] [employer] as a Education administrator for [employer], FNP.,have documented all relevant documentation on the behalf of Kimberly Brine, FNP,as directed by  Kimberly Brine, FNP while in the presence of Kimberly [name], Kimberly [name]. This visit occurred during the SARS-CoV-2 public health emergency.  Safety protocols were in place, including screening questions prior to the visit, additional usage of staff PPE, and extensive cleaning of exam room while observing appropriate contact time as indicated for disinfecting solutions.  Subjective:     Patient ID: Kimberly [name] , female    DOB: 13-Mar-1956 , 65 y.o.   MRN: [id number]   Chief Complaint  Patient presents with  . hospital f/u     Patient stated she went to the hospital for stomach pain. She stated she is still having the pain and she thinks it is from all the medications she is taking. She stated she is getting constipated.     HPI  She is here today for hospital follow up admission.  She was also found to have Atrial fibrillation with RVR.   She is unable to go to PT in person due to her   She has a caregiver for 2 hours a day.  She is trying to get CAP services.  Her sister is working a 3rd shift job as well a couple times a week to help her until she gets better.    She was last sexually active 2 weeks ago then started with abdomen pain. No vaginal discharge or vaginal itching.     Past Medical History:  Diagnosis Date  . Bicipital tenosynovitis   . Coronary artery disease 05/2007   [id number]   . Fall 02/13/2018  . Family history of ischemic heart disease   . Gout   . Hammer toe   . Hip, thigh, leg, and ankle, insect bite, nonvenomous, without mention of infection(916.4)   . Hyperlipidemia   . Hypertension   . Other abnormality of red blood cells   . Rotator cuff syndrome of left shoulder   . Stroke [facility])      Family History  Problem Relation Age of Onset  . Hypertension Mother   . Heart attack Father   . Diabetes Father   .  Kidney failure Father   . Anxiety disorder Sister   . Sarcoidosis Sister      Current Outpatient Medications:  .  amLODipine (NORVASC) 2.5 MG tablet, Take 3 tablets (7.5 mg total) by mouth daily., Disp: 90 tablet, Rfl: 0 .  apixaban (ELIQUIS) 5 MG TABS tablet, Take 1 tablet (5 mg total) by mouth 2 (two) times daily., Disp: 180 tablet, Rfl: 3 .  aspirin (QC LO-DOSE ASPIRIN) 81 MG EC tablet, Take 1 tablet (81 mg total) by mouth daily. Swallow whole., Disp: 90 tablet, Rfl: 1 .  atorvastatin (LIPITOR) 80 MG tablet, TAKE 1 TABLET BY MOUTH DAILY (Patient taking differently: Take 80 mg by mouth daily.), Disp: 30 tablet, Rfl: 1 .  Blood Pressure Monitoring (BLOOD PRESSURE KIT) DEVI, Use as directed to check blood pressure daily dx: I10, Disp: 1 each, Rfl: 1 .  dicyclomine (BENTYL) 10 MG capsule, Take 1 capsule (10 mg total) by mouth 4 (four) times daily -  before meals and at bedtime for 10 days., Disp: 40 capsule, Rfl: 0 .  ferrous sulfate 325 (65 FE) MG tablet, Take 1 tablet (325 mg total) by mouth daily with breakfast., Disp: 30 tablet, Rfl: 0 .  meclizine (ANTIVERT) 25 MG tablet, Take 1 tablet (25 mg  total) by mouth 3 (three) times daily as needed for dizziness., Disp: 30 tablet, Rfl: 0 .  metoprolol tartrate (LOPRESSOR) 25 MG tablet, Take 1 tablet (25 mg total) by mouth 2 (two) times daily., Disp: 60 tablet, Rfl: 0 .  Multiple Vitamin (MULTIVITAMIN) capsule, Take 1 capsule by mouth daily., Disp: , Rfl:  .  omeprazole (PRILOSEC) 20 MG capsule, Take 1 capsule (20 mg total) by mouth 2 (two) times daily before a meal., Disp: 180 capsule, Rfl: 1 .  polyethylene glycol (MIRALAX / GLYCOLAX) 17 g packet, Take 17 g by mouth daily., Disp: 14 each, Rfl: 0 .  traMADol (ULTRAM) 50 MG tablet, Take 1 tablet (50 mg total) by mouth every 6 (six) hours as needed for up to 3 days for moderate pain., Disp: 12 tablet, Rfl: 0   Allergies  Allergen Reactions  . Clonidine Derivatives Other (See Comments)    Patch  causes scars  . Spironolactone Rash     Review of Systems  Constitutional: Negative.   Respiratory: Negative.   Cardiovascular: Negative.   Psychiatric/Behavioral: Negative.      Today's Vitals   08/23/20 1149  BP: 112/80  Pulse: 61  Temp: 98.3 F (36.8 C)  TempSrc: Oral  PainSc: 8   PainLoc: Abdomen   There is no height or weight on file to calculate BMI.   Objective:  Physical Exam Constitutional:      General: She is not in acute distress.    Appearance: Normal appearance.     Comments: Thin frame, appears malnourished  Cardiovascular:     Rate and Rhythm: Normal rate and regular rhythm.     Pulses: Normal pulses.     Heart sounds: Normal heart sounds. No murmur heard.   Pulmonary:     Effort: Pulmonary effort is normal. No respiratory distress.     Breath sounds: Normal breath sounds. No wheezing.  Musculoskeletal:     Comments: She is sitting wheelchair  Neurological:     General: No focal deficit present.     Mental Status: She is alert and oriented to person, place, and time.     Cranial Nerves: No cranial nerve deficit.  Psychiatric:        Mood and Affect: Mood normal.        Behavior: Behavior normal.        Thought Content: Thought content normal.        Judgment: Judgment normal.         Assessment And Plan:     1. Acute vaginitis  Her and her sister were able to get a self aptima swab  She admits to being sexually active  She was treated with doxycline while in the hospital after the patient felt had STD  2. Generalized abdominal pain  TCM Performed. A member of the clinical team spoke with the patient upon dischare. Discharge summary was reviewed in full detail during the visit. Meds reconciled and compared to discharge meds. Medication list is updated and reviewed with the patient.  Greater than 50% face to face time was spent in counseling an coordination of care.  All questions were answered to the satisfaction of the patient.    She  was admitted from 1/5-1/9  Thought to be related to abdominal wall pain vs IBS, she is to follow up with Dr. Benson Norway - Cervicovaginal ancillary only - H Pylori, IGM, IGG, IGA AB  3. Atrial fibrillation, unspecified type ([place]) Went to Afib while in the hospital converted on  her own and is already on Eliquis She is to follow up with Cardiology  4. Ascending aortic aneurysm (HCC) measuring 4.2 cm, recommended to have annual imaging   Patient was given opportunity to ask questions. Patient verbalized understanding of the plan and was able to repeat key elements of the plan. All questions were answered to their satisfaction.  Kimberly Brine, FNP   I, Kimberly Brine, FNP, have reviewed all documentation for this visit. The documentation on 08/23/20 for the exam, diagnosis, procedures, and orders are all accurate and complete.   THE PATIENT IS ENCOURAGED TO PRACTICE SOCIAL DISTANCING DUE TO THE COVID-19 PANDEMIC.

## 2020-08-24 LAB — H PYLORI, IGM, IGG, IGA AB
H pylori, IgM Abs: >35 units — ABNORMAL HIGH (ref 0.0–8.9)
H. pylori, IgA Abs: <9 units (ref 0.0–8.9)
H. pylori, IgG AbS: 4.11 Index Value — ABNORMAL HIGH (ref 0.00–0.79)

## 2020-08-27 ENCOUNTER — Ambulatory Visit: Payer: Medicaid Other

## 2020-08-27 ENCOUNTER — Other Ambulatory Visit: Payer: Self-pay | Admitting: Nurse Practitioner

## 2020-08-27 DIAGNOSIS — R634 Abnormal weight loss: Secondary | ICD-10-CM

## 2020-08-27 DIAGNOSIS — F32A Depression, unspecified: Secondary | ICD-10-CM

## 2020-08-27 DIAGNOSIS — R760 Raised antibody titer: Secondary | ICD-10-CM

## 2020-08-27 DIAGNOSIS — I10 Essential (primary) hypertension: Secondary | ICD-10-CM

## 2020-08-27 LAB — CERVICOVAGINAL ANCILLARY ONLY
Bacterial Vaginitis (gardnerella): POSITIVE — AB
Chlamydia: NEGATIVE
Comment: NEGATIVE
Comment: NEGATIVE
Comment: NORMAL
Neisseria Gonorrhea: NEGATIVE

## 2020-08-27 MED ORDER — AMOXICILLIN 875 MG PO TABS: 875.0000 mg | ORAL_TABLET | Freq: Two times a day (BID) | ORAL | 0 refills | Status: DC

## 2020-08-27 MED ORDER — METRONIDAZOLE 500 MG PO TABS: 500.0000 mg | ORAL_TABLET | Freq: Three times a day (TID) | ORAL | 0 refills | Status: DC

## 2020-08-27 NOTE — Chronic Care Management (AMB) (Signed)
Social Work Note  08/27/2020 Name: NAJLA AUGHENBAUGH MRN: [id number] DOB: [birth year]  Kimberly [name] is a 65 y.o. year old female who is a primary care patient of [name], [facility]Fela [facility].  The Care Management team was consulted for assistance with chronic disease management and care coordination needs.  Engaged with patients sister by phone for follow up visit in response to provider referral for social work chronic care management and care coordination services.  Assessment: Review of patient past medical history, allergies, medications, and health status, including review of pertinent consultant reports was performed as part of comprehensive evaluation and provision of care management/care coordination services.   SDOH (Social Determinants of Health) assessments and interventions performed:  No    Advanced Directives Status: Not addressed in this encounter.  Care Plan  Allergies  Allergen Reactions  . Clonidine Derivatives Other (See Comments)    Patch causes scars  . Spironolactone Rash    Outpatient Encounter Medications as of 08/27/2020  Medication Sig  . amLODipine (NORVASC) 2.5 MG tablet Take 3 tablets (7.5 mg total) by mouth daily.  [name] apixaban (ELIQUIS) 5 MG TABS tablet Take 1 tablet (5 mg total) by mouth 2 (two) times daily.  [name] aspirin (QC LO-DOSE ASPIRIN) 81 MG EC tablet Take 1 tablet (81 mg total) by mouth daily. Swallow whole.  [name] atorvastatin (LIPITOR) 80 MG tablet TAKE 1 TABLET BY MOUTH DAILY (Patient taking differently: Take 80 mg by mouth daily.)  . Blood Pressure Monitoring (BLOOD PRESSURE KIT) DEVI Use as directed to check blood pressure daily dx: I10  . dicyclomine (BENTYL) 10 MG capsule Take 1 capsule (10 mg total) by mouth 4 (four) times daily -  before meals and at bedtime for 10 days.  . ferrous sulfate 325 (65 FE) MG tablet Take 1 tablet (325 mg total) by mouth daily with breakfast.  . meclizine (ANTIVERT) 25 MG tablet Take 1 tablet (25 mg total) by mouth 3  (three) times daily as needed for dizziness.  . metoprolol tartrate (LOPRESSOR) 25 MG tablet Take 1 tablet (25 mg total) by mouth 2 (two) times daily.  . Multiple Vitamin (MULTIVITAMIN) capsule Take 1 capsule by mouth daily.  [name] omeprazole (PRILOSEC) 20 MG capsule Take 1 capsule (20 mg total) by mouth 2 (two) times daily before a meal.  . polyethylene glycol (MIRALAX / GLYCOLAX) 17 g packet Take 17 g by mouth daily.   No facility-administered encounter medications on file as of 08/27/2020.    Patient Active Problem List   Diagnosis Date Noted  . Right sided abdominal pain   . Abdominal wall pain   . Atrial fibrillation ([place]) 08/19/2020  . Educated about COVID-19 virus infection 11/16/2019  . Urinary frequency 04/14/2019  . Cough 04/14/2019  . AKI (acute kidney injury) ([place]) 11/26/2018  . Diarrhea 11/25/2018  . Dehydration, mild 11/25/2018  . Hemiparesis affecting nondominant 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 ([place]) 07/31/2018  . Muscle ache 07/31/2018  . Prediabetes 07/31/2018  . Chronic gout without tophus 07/31/2018  . Vision loss 07/31/2018  . Malnutrition of moderate degree 02/15/2018  . Carotid occlusion, bilateral   . Atrial fibrillation with RVR ([place]) 02/13/2018  . Positive D dimer 02/13/2018  . Back pain 02/13/2018  . CVA (cerebral vascular accident) ([place]) 02/13/2018  . Paroxysmal atrial fibrillation ([place]) 02/13/2018  . Coronary artery disease involving native coronary artery of native heart without angina pectoris 02/13/2018  . Apical variant hypertrophic cardiomyopathy ([place]) 02/13/2018  . 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  [place]) 02/13/2018  . Aortic atherosclerosis ([place]) 02/13/2018  . CAD, ARTERY BYPASS GRAFT 05/03/2009  . TOBACCO USE, QUIT 05/03/2009  . ROTATOR CUFF SYNDROME, LEFT 02/12/2009  . Shoulder pain, left 11/03/2008  . HAMMER TOE, ACQUIRED 02/11/2008  . INSECT BITE, LEG 02/11/2008  . Cerebral artery occlusion with cerebral infarction ([place]) 12/05/2007  .  Dyslipidemia 11/05/2007  . UNSPECIFIED DISORDER TEETH&SUPPORTING STRUCTURES 11/05/2007  . BICEPS TENDINITIS, RIGHT 04/24/2007  . Essential hypertension 04/23/2007  . MICROCYTOSIS 04/23/2007    Conditions to be addressed/monitored: HTN and Depression]; Limited social support and Limited access to caregiver  Care Plan : Social Work [facility] Plan of Care  Updates made by [name] since 08/27/2020 12:00 AM    Problem: Barriers to Treatment     Long-Range Goal: Barriers to Treatment Identified and Managed   Start Date: 08/27/2020  Expected End Date: 12/25/2020  Priority: Low  Note:   Current Barriers:  . Limited social support . Level of care concerns . ADL IADL limitations . Limited access to caregiver . Abnormal weight loss . Chronic conditions including HTN and Depression which put patient at increased risk of hospitalization  Social Work Clinical Goal(s):  [name] Over the next 120 days the patient will work with SW to address care coordination needs   Interventions: . 1:1 collaboration with [name], [place] regarding development and update of comprehensive plan of care as evidenced by provider attestation and co-signature . Inter-disciplinary care team collaboration (see longitudinal plan of care) . Successful outbound call placed to the patients sister [name] to assist with care coordination needs . Discussed the patient is doing well at this time . Confirmed involvement with embedded Pharmacist to assist with medication related concerns  Patient Goals/Self-Care Activities Over the next 60 days, patient will: With the help of her sister  - Patient will self administer medications as prescribed Patient will attend all scheduled provider appointments Patient will call provider office for new concerns or questions Contact SW as needed prior to next scheduled call  Follow up Plan: SW will follow up with patient by phone over the next 60 days       Follow Up Plan: SW will  follow up with patient by phone over the next 60 days      [name], BSW, CDP Social Worker, Certified Dementia Practitioner [place] [facility] [place] [facility] [phone]

## 2020-08-27 NOTE — Patient Instructions (Signed)
   Goals we discussed today:  Goals Addressed    . Work with SW to address barriers to treatment       Timeframe:  Long-Range Goal Priority:  Low Start Date:   1.14.22                          Expected End Date: 5.14.22                       Next planned outreach: 3.2.22  Patient Goals/Self-Care Activities Over the next 60 days, patient will: With the help of her sister  - Patient will self administer medications as prescribed Patient will attend all scheduled provider appointments Patient will call provider office for new concerns or questions Contact SW as needed prior to next scheduled call

## 2020-09-02 ENCOUNTER — Telehealth: Payer: Self-pay

## 2020-09-02 ENCOUNTER — Other Ambulatory Visit: Payer: Self-pay

## 2020-09-02 DIAGNOSIS — R760 Raised antibody titer: Secondary | ICD-10-CM

## 2020-09-02 NOTE — Chronic Care Management (AMB) (Signed)
Chronic Care Management Pharmacy Assistant   Name: Kimberly [name]  MRN: [id number] DOB: 19-Aug-1955  Reason for Encounter: Disease State- Lipids Adherence Call.  Patient Questions:   1.  Have you seen any other providers since your last visit? Yes 08/18/2020 ED to [place] 08/23/2020 [place] - Abdominal pain.    2.  Any changes in your medicines or health? Yes . Eliquis 5 mg tablet  .  PCP : [name], FNP  Allergies:   Allergies  Allergen Reactions   Clonidine Derivatives Other (See Comments)    Patch causes scars   Spironolactone Rash    Medications: Outpatient Encounter Medications as of 09/02/2020  Medication Sig   amLODipine (NORVASC) 2.5 MG tablet Take 3 tablets (7.5 mg total) by mouth daily.   amoxicillin (AMOXIL) 875 MG tablet Take 1 tablet (875 mg total) by mouth 2 (two) times daily.   apixaban (ELIQUIS) 5 MG TABS tablet Take 1 tablet (5 mg total) by mouth 2 (two) times daily.   aspirin (QC LO-DOSE ASPIRIN) 81 MG EC tablet Take 1 tablet (81 mg total) by mouth daily. Swallow whole.   atorvastatin (LIPITOR) 80 MG tablet TAKE 1 TABLET BY MOUTH DAILY (Patient taking differently: Take 80 mg by mouth daily.)   Blood Pressure Monitoring (BLOOD PRESSURE KIT) DEVI Use as directed to check blood pressure daily dx: I10   dicyclomine (BENTYL) 10 MG capsule Take 1 capsule (10 mg total) by mouth 4 (four) times daily -  before meals and at bedtime for 10 days.   ferrous sulfate 325 (65 FE) MG tablet Take 1 tablet (325 mg total) by mouth daily with breakfast.   meclizine (ANTIVERT) 25 MG tablet Take 1 tablet (25 mg total) by mouth 3 (three) times daily as needed for dizziness.   metoprolol tartrate (LOPRESSOR) 25 MG tablet Take 1 tablet (25 mg total) by mouth 2 (two) times daily.   metroNIDAZOLE (FLAGYL) 500 MG tablet Take 1 tablet (500 mg total) by mouth 3 (three) times daily for 14 days.   Multiple Vitamin  (MULTIVITAMIN) capsule Take 1 capsule by mouth daily.   omeprazole (PRILOSEC) 20 MG capsule Take 1 capsule (20 mg total) by mouth 2 (two) times daily before a meal.   polyethylene glycol (MIRALAX / GLYCOLAX) 17 g packet Take 17 g by mouth daily.   No facility-administered encounter medications on file as of 09/02/2020.    Current Diagnosis: Patient Active Problem List   Diagnosis Date Noted   Right sided abdominal pain    Abdominal wall pain    Atrial fibrillation ([place]) 08/19/2020   Educated about COVID-19 virus infection 11/16/2019   Urinary frequency 04/14/2019   Cough 04/14/2019   AKI (acute kidney injury) ([place]) 11/26/2018   Diarrhea 11/25/2018   Dehydration, mild 11/25/2018   Hemiparesis affecting nondominant 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 ([place]) 07/31/2018   Muscle ache 07/31/2018   Prediabetes 07/31/2018   Chronic gout without tophus 07/31/2018   Vision loss 07/31/2018   Malnutrition of moderate degree 02/15/2018   Carotid occlusion, bilateral    Atrial fibrillation with RVR ([place]) 02/13/2018   Positive D dimer 02/13/2018   Back pain 02/13/2018   CVA (cerebral vascular accident) ([place]) 02/13/2018   Paroxysmal atrial fibrillation ([place]) 02/13/2018   Coronary artery disease involving native coronary artery of native heart without angina pectoris 02/13/2018   Apical variant hypertrophic cardiomyopathy ([place]) 02/13/2018   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[place]) 02/13/2018   Aortic  atherosclerosis ([place]) 02/13/2018   CAD, ARTERY BYPASS GRAFT 05/03/2009   TOBACCO USE, QUIT 05/03/2009   ROTATOR CUFF SYNDROME, LEFT 02/12/2009   Shoulder pain, left 11/03/2008   HAMMER TOE, ACQUIRED 02/11/2008   INSECT BITE, LEG 02/11/2008   Cerebral artery occlusion with cerebral infarction ([place]) 12/05/2007   Dyslipidemia 11/05/2007   UNSPECIFIED DISORDER TEETH&SUPPORTING STRUCTURES 11/05/2007   BICEPS TENDINITIS, RIGHT 04/24/2007   Essential  hypertension 04/23/2007   MICROCYTOSIS 04/23/2007    Comprehensive medication review performed; Spoke to patient regarding cholesterol  Lipid Panel    Component Value Date/Time   CHOL 114 09/02/2019 1651   TRIG 58 09/02/2019 1651   HDL 47 09/02/2019 1651   LDLCALC 54 09/02/2019 1651    10-year ASCVD risk score: The ASCVD Risk score Mikey Bussing DC Jr., et al., 2013) failed to calculate for the following reasons:   The patient has a prior MI or stroke diagnosis    Current antihyperlipidemic regimen:  o Atorvastatin 80 mg one a day o   Previous antihyperlipidemic medications tried: None   What recent interventions/DTPs have been made by any provider to improve Cholesterol control since last CPP Visit: Patient states she has been taking her medication as directed.   Any recent hospitalizations or ED visits since last visit with CPP? No   What diet changes have been made to improve Cholesterol?  o Per Sister , states patient is not eating well, they are lucky if they get her to eat one meal a day - because of loss of appetite.  What exercise is being done to improve Cholesterol?  o Per Sister she is doing chart exercises and range of motions, stretching exercises.  Adherence Review: Does the patient have >5 day gap between last estimated fill dates? No    Follow-Up:  Pharmacist Review New patient assistance form filled out to [facility] for [employer] . Per sister she will bring proof of income and sign at next appointment she has with [name] Moore,FNP.  Vallie Pearson,CPP Notified  Judithann Sheen, [facility] Clinical Pharmacist Assistant [phone]

## 2020-09-03 ENCOUNTER — Ambulatory Visit: Payer: Medicaid Other

## 2020-09-03 ENCOUNTER — Telehealth: Payer: Medicaid Other

## 2020-09-07 ENCOUNTER — Other Ambulatory Visit: Payer: Self-pay

## 2020-09-07 DIAGNOSIS — R760 Raised antibody titer: Secondary | ICD-10-CM

## 2020-09-07 MED ORDER — AMOXICILLIN 875 MG PO TABS: 875.0000 mg | ORAL_TABLET | Freq: Two times a day (BID) | ORAL | 0 refills | Status: DC

## 2020-09-07 MED ORDER — METRONIDAZOLE 500 MG PO TABS: 500.0000 mg | ORAL_TABLET | Freq: Three times a day (TID) | ORAL | 0 refills | Status: AC

## 2020-09-10 NOTE — Progress Notes (Deleted)
Cardiology Office Note:    Date:  09/10/2020   ID:  Kimberly [name], DOB November 22, 1955, MRN [id number]  PCP:  [name], FNP  Cardiologist:  [name], MD  Electrophysiologist:  None   Referring MD: [name], FNP   Chief Complaint: hospital follow-up for atrial fibrillation with RVR  History of Present Illness:    Kimberly [name] is a 65 y.o. female with a history of CAD s/p CABG in 2008, paroxysmal atrial fibrillation on Eliquis, chronic dyspnea, occluded internal carotid arteries bilaterally, 4.3cm ascending aortic aneurysm on chest CT in 04/2020, 2.9cm focal dilatation of infrarenal abdominal aorta on CT in 08/2020, stroke in 2019 with residual life sided weakness, hypertension, hyperlipidemia, and long smoking history who is followed by Dr. Percival Spanish and presents today for hospital follow-up.   A Myoview was ordered in 03/2020 for further evaluation of dyspnea prior to a colonoscopy and showed a large partially reversible lateral wall perfusion defect consistent with infarct with a significant region of peri-infarct ischemia. Patient was recently seen by Dr. Percival Spanish on 07/28/2020 at which time she reported dyspnea on exertion. A friend who came to the the visit with her reported that she continued to smoke and had been smoking more than she ever had in the past. Therefore, dyspnea was felt to be secondary to long smoking history. She was advised to follow-up in 12 months.   She was admitted from 08/18/2020 to 08/22/2020 for acute right upper and lower quadrant abdominal pain with associated nausea and vomiting. LFTs and lipase were normal. Abdominal/pelvic CT did not show any acute intra-abdominal abnormalities and pain resolved after she received pain medications. She was incidentally found to be in atrial fibrillation with RVR on arrival. She was started on IV Cardizem and converted back to sinus rhythm.  She did have one episode of chest pain that radiated to her left arm during  episode of abdominal pain but she had difficulty describing this. High-sensitivity troponin was mildly elevated and flat in the 100's. Felt to be due to demand ischemia secondary to atrial fibrillation with RVR. Echo showed LVEF of 65-70% with normal wall motion, mild MR, mild to moderate TR, and mild dilatation of the ascending aorta measuring 42 mm. She was started on Lopressor and Eliquis. GI was consulted due to intractable abdominal pain and she was started on Bentyl with improvement. Abdominal pain was felt to likely be secondary to abdominal wall pain vs IBS. She was advised to follow-up with her outpatient gastroenterologist.   Patient presents today for follow-up. ***  Paroxysmal Atrial Fibrillation - Recently admitted with atrial fibrillation with RVR after presenting with severe abdominal pain. Converted back to sinus rhythm with IV Cardizem.  - Maintaining sinus rhythm today. *** - Echo on 08/19/2020 showed normal LVEF.  - Continue Lopressor 25mg  twice daily.  - CHA2DS2-VASc = 5 (CAD, HTN, stroke x2, female). Continue Eliquis 5mg  twice daily.   CAD s/p CABG - History of CABG in 2009. Myoview in 03/2020 showed large defect consistent with infarct a significant region of peri-infarct ischemia.  - No angina. *** - Continue aspirin, beta-blocker, and high-intensity statin.  Carotid Artery Disease - Known bilaterally occluded ICAs.  - Being managed medically. Continue aspirin and high-intensity statin.  Ascending Aortic Aneurysm  - Chest CT in 04/2020 showed stable ascending aortic aneurysm of 4.3cm compared to prior imaging in 02/2018. Annual imaging recommended.  - Echo during recent admission showed mild dilatation of ascending aorta measuring 4.2cm. - Can continue to  follow Echo going forward given measurement on CT and Echo were similar.  Dilatation of Infrarenal Abdominal Aorta - CT during recent admission showed focal dilation of infrarenal abdominal aorta measuring up to 2.8 cm.   - Follow-up ultrasound recommended in 5 years. Will defer ordering of this to PCP.   Hypertension - *** - Continue Lopressor 25mg  twice daily.  Hyperlipidemia - Continue Lipitor 80mg  daily.  Past Medical History:  Diagnosis Date  . Bicipital tenosynovitis   . Coronary artery disease 05/2007   [id number]   . Fall 02/13/2018  . Family history of ischemic heart disease   . Gout   . Hammer toe   . Hip, thigh, leg, and ankle, insect bite, nonvenomous, without mention of infection(916.4)   . Hyperlipidemia   . Hypertension   . Other abnormality of red blood cells   . Rotator cuff syndrome of left shoulder   . Stroke [facility])     Past Surgical History:  Procedure Laterality Date  . CARDIAC CATHETERIZATION    . COLONOSCOPY WITH PROPOFOL N/A 01/30/2020   Procedure: COLONOSCOPY WITH PROPOFOL;  Surgeon: Carol Ada, MD;  Location: [facility] ENDOSCOPY;  Service: Endoscopy;  Laterality: N/A;  . CORONARY ARTERY BYPASS GRAFT     triple  . CORONARY ARTERY BYPASS GRAFT  2008  . POLYPECTOMY  01/30/2020   Procedure: POLYPECTOMY;  Surgeon: Carol Ada, MD;  Location: [facility] ENDOSCOPY;  Service: Endoscopy;;  . TUBAL LIGATION      Current Medications: No outpatient medications have been marked as taking for the 09/13/20 encounter (Appointment) with Darreld Mclean, PA-C.     Allergies:   Clonidine derivatives and Spironolactone   Social History   Socioeconomic History  . Marital status: Single    Spouse name: Not on file  . Number of children: 2  . Years of education: Not on file  . Highest education level: 11th grade  Occupational History  . Occupation: retired  Tobacco Use  . Smoking status: Former Smoker    Packs/day: 2.00    Years: 35.00    Pack years: 70.00    Types: Cigarettes    Quit date: 08/12/2019    Years since quitting: 1.0  . Smokeless tobacco: Former Systems developer    Quit date: 08/09/2008  . Tobacco comment: had assistance with the quit smoking - had patches and lozenges  Vaping  Use  . Vaping Use: Never used  Substance and Sexual Activity  . Alcohol use: Yes    Comment: ocassionally wine  . Drug use: Yes    Types: Marijuana    Comment: history of cocaine use  . Sexual activity: Not on file  Other Topics Concern  . Not on file  Social History Narrative   Patient is right-handed. She lives alone in a one level home, 2 steps to enter. Her family is very active in her care.    Social Determinants of Health   Financial Resource Strain: Not on file  Food Insecurity: Not on file  Transportation Needs: Not on file  Physical Activity: Not on file  Stress: Not on file  Social Connections: Not on file     Family History: The patient's ***family history includes Anxiety disorder in her sister; Diabetes in her father; Heart attack in her father; Hypertension in her mother; Kidney failure in her father; Sarcoidosis in her sister.  ROS:   Please see the history of present illness.    *** All other systems reviewed and are negative.  EKGs/Labs/Other Studies Reviewed:  The following studies were reviewed today:  Lexiscan Myoview 04/01/2020:  Patient was in atrial fibrillation with RVR during study, HR 110-120s  Study was not gated due to atrial fibrillation  Defect 1: There is a large defect of severe severity present in the basal inferolateral, basal anterolateral, mid inferolateral, mid anterolateral and apical lateral location.  Findings consistent with prior myocardial infarction with peri-infarct ischemia.  This is an intermediate risk study.   Large partially reversible lateral wall perfusion defect. Consistent with infarct with significant region of peri-infarct ischemia. _______________  Echocardiogram 08/19/2020: Impressions: 1. Left ventricular ejection fraction, by estimation, is 65 to 70%. The  left ventricle has normal function. The left ventricle has no regional  wall motion abnormalities. There is mild concentric left ventricular   hypertrophy. Left ventricular diastolic  function could not be evaluated.  2. Right ventricular systolic function is normal. The right ventricular  size is normal. There is normal pulmonary artery systolic pressure. The  estimated right ventricular systolic pressure is 09.3 mmHg.  3. Left atrial size was moderately dilated.  4. Right atrial size was mildly dilated.  5. The mitral valve is normal in structure. Mild mitral valve  regurgitation. No evidence of mitral stenosis.  6. Tricuspid valve regurgitation is mild to moderate.  7. The aortic valve is normal in structure. Aortic valve regurgitation is  not visualized. No aortic stenosis is present.  8. Aortic dilatation noted. There is mild dilatation of the ascending  aorta, measuring 42 mm.  9. The inferior vena cava is normal in size with greater than 50%  respiratory variability, suggesting right atrial pressure of 3 mmHg.   EKG:  EKG ordered today. EKG personally reviewed and demonstrates ***.  Recent Labs: 08/19/2020: TSH 0.897 08/20/2020: Magnesium 2.4 08/21/2020: ALT 22; BUN 17; Creatinine, Ser 0.97; Hemoglobin 10.2; Platelets 199; Potassium 4.2; Sodium 137  Recent Lipid Panel    Component Value Date/Time   CHOL 114 09/02/2019 1651   TRIG 58 09/02/2019 1651   HDL 47 09/02/2019 1651   CHOLHDL 2.4 09/02/2019 1651   CHOLHDL 3.6 02/14/2018 0314   VLDL 16 02/14/2018 0314   LDLCALC 54 09/02/2019 1651    Physical Exam:    Vital Signs: There were no vitals taken for this visit.    Wt Readings from Last 3 Encounters:  08/18/20 113 lb (51.3 kg)  07/28/20 114 lb 9.6 oz (52 kg)  04/25/20 118 lb (53.5 kg)     General: 65 y.o. female in no acute distress. HEENT: Normocephalic and atraumatic. Sclera clear. EOMs intact. Neck: Supple. No carotid bruits. No JVD. Heart: *** RRR. Distinct S1 and S2. No murmurs, gallops, or rubs. Radial and distal pedal pulses 2+ and equal bilaterally. Lungs: No increased work of breathing.  Clear to ausculation bilaterally. No wheezes, rhonchi, or rales.  Abdomen: Soft, non-distended, and non-tender to palpation. Bowel sounds present in all 4 quadrants.  MSK: Normal strength and tone for age. *** Extremities: No lower extremity edema.    Skin: Warm and dry. Neuro: Alert and oriented x3. No focal deficits. Psych: Normal affect. Responds appropriately.   Assessment:    No diagnosis found.  Plan:     Disposition: Follow up in ***   Medication Adjustments/Labs and Tests Ordered: Current medicines are reviewed at length with the patient today.  Concerns regarding medicines are outlined above.  No orders of the defined types were placed in this encounter.  No orders of the defined types were placed in this encounter.  There are no Patient Instructions on file for this visit.   Signed, Darreld Mclean, PA-C  09/10/2020 8:46 PM    [facility]

## 2020-09-13 ENCOUNTER — Ambulatory Visit: Payer: Medicaid Other | Admitting: Student

## 2020-09-14 ENCOUNTER — Telehealth: Payer: Self-pay

## 2020-09-14 NOTE — Progress Notes (Addendum)
09/14/20- Called and spoke to the patient's sister Ms. [name] regarding the patient's CCM Call appointment on 09/15/20 at 9:15 am with [name], CPP. [name] voiced she will be at work tomorrow on the day of call but will let job know she has to take call for the patient. Patient's sister was advised to have the patient's medications and supplements she takes written down so [name], CPP can review. The patient's sister verbalized understanding and stated she will get the patient's medication list for the telephone visit tomorrow with CPP.  [name], CPP Notified.  Raynelle Highland, [place] Pharmacist Assistant [phone]  I have reviewed the care management and care coordination activities outlined in this encounter and I am certifying that I agree with the content of this note. No further action required.  2 minutes spent in review, coordination, and documentation.  Mayford Knife, [facility] 09/15/20 11:57 AM

## 2020-09-15 ENCOUNTER — Ambulatory Visit: Payer: Self-pay

## 2020-09-15 DIAGNOSIS — I10 Essential (primary) hypertension: Secondary | ICD-10-CM

## 2020-09-15 DIAGNOSIS — I4891 Unspecified atrial fibrillation: Secondary | ICD-10-CM

## 2020-09-15 DIAGNOSIS — F32A Depression, unspecified: Secondary | ICD-10-CM

## 2020-09-15 NOTE — Patient Instructions (Signed)
Visit Information  Goals Addressed            This Visit's Progress   . Pharmacy Care Plan        CARE PLAN ENTRY (see longitudinal plan of care for additional care plan information)  Current Barriers:  . Chronic Disease Management support, education, and care coordination needs related to Hypertension, Hyperlipidemia, Atrial Fibrillation, GERD, and Depression   Hypertension BP Readings from Last 3 Encounters:  07/28/20 117/76  05/17/20 122/68  04/27/20 (!) 120/91   . Pharmacist Clinical Goal(s): o Over the next 90 days, patient will work with PharmD and providers to maintain BP goal <130/80 . Current regimen:  o Amlodipine 5 MG - take daily . Interventions: o Dietary and exercise recommendations provided - Limit salt intake . Patient self care activities - Over the next 90 days, patient will: o Check BP  once per week and document, and provide at future appointments o Ensure daily salt intake < 2300 mg/day  Hyperlipidemia Lab Results  Component Value Date/Time   LDLCALC 54 09/02/2019 04:51 PM   . Pharmacist Clinical Goal(s): o Over the next 90 days, patient will work with PharmD and providers to maintain LDL goal < 70 . Current regimen:  o Atorvastatin 80 MG- take daily . Interventions: o Dietary and exercise recommendations provided - Increase intake of healthy fats (i.e avocados, walnuts, flaxseed) - Decrease intake of foods high in saturated and trans fat (i.e. chips) . Patient self care activities - Over the next 90 days, patient will: o Work to improve diet, increase heart healthy fats and decrease trans/saturated fats  ATRIAL FIBRILLATION . Pharmacist Clinical Goal(s) o Over the next 90 days, patient will work with PharmD and providers to stay rate regulated  . Current regimen:  o ELIQUIS 5 MG- Take twice daily o Metoprolol 25 mg - take twice daily  . Interventions: o Discussed signs and symptoms of bleeding . Patient self care activities - Over the  next 90 days, patient will: o Continue to take medication twice per day  Depression . Pharmacist Clinical Goal(s) o Over the next 90 days, patient will work with PharmD and providers to improve sadness and crying . Current regimen:  o Citalopram 10 MG- take daily . Interventions: o Follow up with PCP team and determine if patients dose can be adjusted to Citalopram 20 MG daily  . Patient self care activities - Over the next 90 days, patient will: o Continue to take medication everyday.  Medication management . Pharmacist Clinical Goal(s): o Over the next 90 days, patient will work with PharmD and providers to maintain optimal medication adherence . Current pharmacy: [facility] . Interventions o Comprehensive medication review performed. o Continue current medication management strategy . Patient self care activities - Over the next 90 days, patient will: o Focus on medication adherence by using pill packets provided by pharmacy o Take medications as prescribed o Report any questions or concerns to PharmD and/or provider(s)  Please see past updates related to this goal by clicking on the "Past Updates" button in the selected goal         The patient verbalized understanding of instructions, educational materials, and care plan provided today and agreed to receive a mailed copy of patient instructions, educational materials, and care plan.   The pharmacy team will reach out to the patient again over the next 30 days.   Mayford Knife, [facility]

## 2020-09-15 NOTE — Chronic Care Management (AMB) (Signed)
Chronic Care Management Pharmacy  Name: Kimberly [name]  MRN: [id number] DOB: [birth year]  Subjective:  [name] reports that her sister went to the hospital on 08/18/2020. The morning caregiver comes to the house 2 hours per week. On the morning of January 5th the caregiver was having trouble getting [name] out of bed. She called the paramedics and they took her to the hospital. She reports the paramedics thought that [name] was having heart issues. When patient was discharged her medications were changed. [name] reports that [name] has been her with the care of [name] for the past couple of weeks and will be at her mothers house until the end of the week.    Chief Complaint/ HPI  Kimberly [name],  65 y.o. , female presents for their Follow-Up CCM visit with the clinical pharmacist via telephone due to COVID-19 Pandemic.  PCP : Kimberly Brine, FNP  Their chronic conditions include: Hypertension, Prediabetes, Paroxysmal  Atrial Fibrillation, Hyperlipidemia, Coronary Artery Disease, Depression  Office Visits: 08/23/2020 OV  08/18/2020: Sister called and wanted to bring her sister in the office for appointment from the ER because 911 was called and the paramedic thought she was having a heartache they took her to the ER but there is a 11 hr wait. Explained to the patient that our office is not equipped to handle a possible heartache and that they are at the best place patient sister understood and wanted to bring her sister in the office for appointment from the ER because 911 was called and the paramedic thought she was having a heartache they took her to the ER but there is a 11 hr wait. Explained to the patient that our office is not equipped to handle a possible heartache and that they are at the best place patient sister understood  Consult Visit:  03/18/2020 Cariodiologist OV: Patient to have lexiscan myoview, Amlodipine 7.5 MG daily,   ED  Visit: 08/18/2020 01/30/2020 Medications: Outpatient Encounter Medications as of 09/15/2020  Medication Sig  . amLODipine (NORVASC) 2.5 MG tablet Take 3 tablets (7.5 mg total) by mouth daily.  [name] amoxicillin (AMOXIL) 875 MG tablet Take 1 tablet (875 mg total) by mouth 2 (two) times daily.  [name] apixaban (ELIQUIS) 5 MG TABS tablet Take 1 tablet (5 mg total) by mouth 2 (two) times daily.  [name] aspirin (QC LO-DOSE ASPIRIN) 81 MG EC tablet Take 1 tablet (81 mg total) by mouth daily. Swallow whole.  [name] atorvastatin (LIPITOR) 80 MG tablet TAKE 1 TABLET BY MOUTH DAILY (Patient taking differently: Take 80 mg by mouth daily.)  . Blood Pressure Monitoring (BLOOD PRESSURE KIT) DEVI Use as directed to check blood pressure daily dx: I10  . dicyclomine (BENTYL) 10 MG capsule Take 1 capsule (10 mg total) by mouth 4 (four) times daily -  before meals and at bedtime for 10 days.  . ferrous sulfate 325 (65 FE) MG tablet Take 1 tablet (325 mg total) by mouth daily with breakfast.  . meclizine (ANTIVERT) 25 MG tablet Take 1 tablet (25 mg total) by mouth 3 (three) times daily as needed for dizziness.  . metoprolol tartrate (LOPRESSOR) 25 MG tablet Take 1 tablet (25 mg total) by mouth 2 (two) times daily.  . metroNIDAZOLE (FLAGYL) 500 MG tablet Take 1 tablet (500 mg total) by mouth 3 (three) times daily for 14 days.  . Multiple Vitamin (MULTIVITAMIN) capsule Take 1 capsule by mouth daily.  [name] omeprazole (PRILOSEC) 20 MG  capsule Take 1 capsule (20 mg total) by mouth 2 (two) times daily before a meal.  . polyethylene glycol (MIRALAX / GLYCOLAX) 17 g packet Take 17 g by mouth daily.   No facility-administered encounter medications on file as of 09/15/2020.     Current Diagnosis/Assessment:  Goals Addressed            This Visit's Progress   . Pharmacy Care Plan        CARE PLAN ENTRY (see longitudinal plan of care for additional care plan information)  Current Barriers:  . Chronic Disease Management support,  education, and care coordination needs related to Hypertension, Hyperlipidemia, Atrial Fibrillation, GERD, and Depression   Hypertension BP Readings from Last 3 Encounters:  07/28/20 117/76  05/17/20 122/68  04/27/20 (!) 120/91   . Pharmacist Clinical Goal(s): o Over the next 90 days, patient will work with PharmD and providers to maintain BP goal <130/80 . Current regimen:  o Amlodipine 5 MG - take daily . Interventions: o Dietary and exercise recommendations provided - Limit salt intake . Patient self care activities - Over the next 90 days, patient will: o Check BP  once per week and document, and provide at future appointments o Ensure daily salt intake < 2300 mg/day  Hyperlipidemia Lab Results  Component Value Date/Time   LDLCALC 54 09/02/2019 04:51 PM   . Pharmacist Clinical Goal(s): o Over the next 90 days, patient will work with PharmD and providers to maintain LDL goal < 70 . Current regimen:  o Atorvastatin 80 MG- take daily . Interventions: o Dietary and exercise recommendations provided - Increase intake of healthy fats (i.e avocados, walnuts, flaxseed) - Decrease intake of foods high in saturated and trans fat (i.e. chips) . Patient self care activities - Over the next 90 days, patient will: o Work to improve diet, increase heart healthy fats and decrease trans/saturated fats  ATRIAL FIBRILLATION . Pharmacist Clinical Goal(s) o Over the next 90 days, patient will work with PharmD and providers to stay rate regulated  . Current regimen:  o ELIQUIS 5 MG- Take twice daily o Metoprolol 25 mg - take twice daily  . Interventions: o Discussed signs and symptoms of bleeding . Patient self care activities - Over the next 90 days, patient will: o Continue to take medication twice per day  Depression . Pharmacist Clinical Goal(s) o Over the next 90 days, patient will work with PharmD and providers to improve sadness and crying . Current regimen:  o Citalopram 10  MG- take daily . Interventions: o Follow up with PCP team and determine if patients dose can be adjusted to Citalopram 20 MG daily  . Patient self care activities - Over the next 90 days, patient will: o Continue to take medication everyday.  Medication management . Pharmacist Clinical Goal(s): o Over the next 90 days, patient will work with PharmD and providers to maintain optimal medication adherence . Current pharmacy: [facility] . Interventions o Comprehensive medication review performed. o Continue current medication management strategy . Patient self care activities - Over the next 90 days, patient will: o Focus on medication adherence by using pill packets provided by pharmacy o Take medications as prescribed o Report any questions or concerns to PharmD and/or provider(s)  Please see past updates related to this goal by clicking on the "Past Updates" button in the selected goal         Hypertension   BP goal is:  <130/80  Office blood pressures are  BP Readings from Last 3 Encounters:  08/23/20 112/80  08/22/20 114/87  07/28/20 117/76   Patient checks BP at home infrequently Patient home BP readings are ranging:   Patient has failed these meds in the past:  Patient is currently controlled on the following medications:  . Amlodipine 2.5 mg- Take 7.5 MG daily  We discussed :  Diet   Patient is not eating any salt in her diet  She is eating small meals through out the day   Plan  Continue current medications   Hyperlipidemia   LDL goal < 70  Last lipids Lab Results  Component Value Date   CHOL 114 09/02/2019   HDL 47 09/02/2019   LDLCALC 54 09/02/2019   TRIG 58 09/02/2019   CHOLHDL 2.4 09/02/2019   Hepatic Function Latest Ref Rng & Units 08/21/2020 08/19/2020 08/18/2020  Total Protein 6.5 - 8.1 g/dL 6.0(L) 6.3(L) 7.8  Albumin 3.5 - 5.0 g/dL 3.5 3.5 4.4  AST 15 - 41 U/L 20 23 27   ALT 0 - 44 U/L 22 26 30   Alk Phosphatase 38 - 126 U/L 97 110 124   Total Bilirubin 0.3 - 1.2 mg/dL 0.6 0.6 0.7  Bilirubin, Direct 0.0 - 0.3 mg/dL - - -     The ASCVD Risk score Mikey Bussing DC Jr., et al., 2013) failed to calculate for the following reasons:   The patient has a prior MI or stroke diagnosis   Patient has failed these meds in past:  Patient is currently controlled on the following medications:  . Atorvastatin 80 MG- Take daily   We discussed:  diet and exercise extensively  Plan  Continue current medications    AFIB   Patient is currently rate controlled. Office heart rates are  Pulse Readings from Last 3 Encounters:  08/23/20 61  08/22/20 (!) 56  07/28/20 75    CHA2DS2-VASc Score = 5  The patient's score is based upon: CHF History: No HTN History: Yes Diabetes History: No Stroke History: Yes Vascular Disease History: Yes Age Score: 0 Gender Score: 1      ASSESSMENT AND PLAN: Paroxysmal Atrial Fibrillation (ICD10:  I48.0) The patient's CHA2DS2-VASc score is 5, indicating a 7.2% annual risk of stroke.      :[id number]} The patient is at significant risk for stroke/thromboembolism based upon her CHA2DS2-VASc Score of 5.  Continue Apixaban (Eliquis).    Signed,  Mayford Knife, [facility]    09/15/2020 9:30 AM    BP goal is:  <130/80  Office blood pressures are  BP Readings from Last 3 Encounters:  08/23/20 112/80  08/22/20 114/87  07/28/20 117/76   Patient checks BP at home infrequently Patient home BP readings are ranging: None reported at this time   Patient has failed these meds in past:  Patient is currently controlled on the following medications:  . Apixiban 5 MG - Take twice daily . Metoprolol 25 mg -taking 1 tablet by mouth twice daily   We discussed:    Confirmed with [facility] that patient is taking Eliquis 5 mg tablet twice per day and it was filled on 09/09/2020  The importance of taking medication everyday   How the medication works  Monitor for signs and symptoms of bruising and or  bleeding    Plan  Continue current medications   Depression   Depression screen [facility] 2/9 03/30/2020 12/02/2019 12/02/2019  Decreased Interest 1 0 0  Down, Depressed, Hopeless 1 1 0  PHQ - 2 Score 2 1  0  Altered sleeping 3 0 -  Tired, decreased energy 3 1 -  Change in appetite 3 1 -  Feeling bad or failure about yourself  1 1 -  Trouble concentrating 1 1 -  Moving slowly or fidgety/restless 3 1 -  Suicidal thoughts 0 0 -  PHQ-9 Score 16 6 -  Difficult doing work/chores Somewhat difficult Somewhat difficult -  Some recent data might be hidden    Patient has failed these meds in past: Citalopram  Patient is currently uncontrolled on the following medications:  . N/A   Will discuss at next office visit   Plan  Continue current medications and control with diet and exercise Plan to collaborate with PCP team to possibly treat patients depression.   GERD   Patient has failed these meds in past:  Patient is currently controlled on the following medications:  . Omeprazole 20 MG Capsule-take daily  Will discuss further during next visit.   Plan  Continue current medications  GOUT   Patient has failed these meds in past:  Patient is currently controlled on the following medications:  [name] Mitigare 0.6 MG Capsule - Take daily   We will discuss during next office visit.  Plan  Continue current medications  Coronary Artery Disease   Patient has failed these meds in past:  Patient is currently controlled on the following medications:  . Aspirin 81 MG - Take daily . Amlodipine 5 MG- Take 1.5 tablet daily . Atorvastatin 80 MG- Take daily . Apixiban 5 MG - Take twice daily  Will discuss in more detail at next office visit.  Plan  Continue current medications   Anemia   CBC Latest Ref Rng & Units 08/21/2020 08/19/2020 08/18/2020  WBC 4.0 - 10.5 K/uL 6.1 6.2 7.2  Hemoglobin 12.0 - 15.0 g/dL 10.2(L) 11.1(L) 11.7(L)  Hematocrit 36.0 - 46.0 % 33.5(L) 36.2 39.0  Platelets  150 - 400 K/uL 199 214 229   Iron/TIBC/Ferritin/ %Sat    Component Value Date/Time   IRON 59 08/21/2020 0554   IRON 103 07/24/2018 1158   TIBC 384 08/21/2020 0554   TIBC 314 07/24/2018 1158   FERRITIN 19 08/21/2020 0554   FERRITIN 77 07/24/2018 1158   IRONPCTSAT 15 08/21/2020 0554   IRONPCTSAT 33 07/24/2018 1158    Patient has failed these meds in past: Patient is currently controlled on the following medications:  . Ferrous Sulfate 325 (65 Fe) - Take 1 tablet by mouth daily   We discussed:    Patient adherent to medication regimen  Medication given to patient by caregiver in the morning, and sister at night.   Plan  Continue current medications   Vaccines   Reviewed and discussed patient's vaccination history.    Immunization History  Administered Date(s) Administered  . Influenza Split 06/24/2016  . Influenza,inj,Quad PF,6+ Mos 07/29/2013, 06/17/2017, 05/17/2020  . Moderna Sars-Covid-2 Vaccination 01/23/2020, 02/20/2020   We Discussed: -Patient scheduled for booster Pneumonia Vaccine   Plan  Recommended patient receive COVID-19 booster vaccine in [place] office in January .   Medication Management   Patient's preferred pharmacy is:  *Patients sister is very concerned about her sisters medication. She bought the pill packet from [facility] for 31 days worth of medication.  [name] wants her sisters medications to be filled by [facility] starting in March using the pill packet system, she is requesting that the medication be delivered when the caregiver is at the house in the morning.  [facility] [place] [address] Phone: [phone] Fax: [place] [address] [place] [address] Phone: [phone] Fax: [phone]  [place], [place] - [address] [place]  [address] Phone: [phone] Fax: [phone]  [facility] - [place], [place] - [address] [place] [address] [place] [address] Phone: [phone] Fax: [phone]  [id number] - [place], [place] [address] [place] [address] [place] [address] [place] [address] Phone: [phone] Fax: [phone]  Uses pill box? Yes Pt endorses 100% compliance  We discussed: At this time patients caregiver prefers to stay with [facility].   Plan  Utilize [facility] for medication synchronization, packaging and delivery CPA to follow up with [name] or [name] to review current medication list for onboarding and pill packet creation to increase compliance.   Follow up: 4 month phone visit   [name], PharmD Clinical Pharmacist [facility] [phone]

## 2020-09-16 ENCOUNTER — Ambulatory Visit: Payer: Medicaid Other | Admitting: Physical Therapy

## 2020-09-17 ENCOUNTER — Ambulatory Visit: Payer: Medicaid Other

## 2020-09-27 ENCOUNTER — Other Ambulatory Visit: Payer: Self-pay | Admitting: Nurse Practitioner

## 2020-09-29 NOTE — Progress Notes (Signed)
 Cardiology Clinic Note   Patient Name: Kimberly [name] Date of Encounter: 09/30/2020  Primary Care Provider:  Moore, [name], FNP Primary Cardiologist:  James Hochrein, MD  Patient Profile    Kimberly [name] 64-year-old female presents the clinic today for follow-up evaluation of her atrial fibrillation and weakness.  Past Medical History    Past Medical History:  Diagnosis Date  . Bicipital tenosynovitis   . Coronary artery disease 05/2007   [id number]   . Fall 02/13/2018  . Family history of ischemic heart disease   . Gout   . Hammer toe   . Hip, thigh, leg, and ankle, insect bite, nonvenomous, without mention of infection(916.4)   . Hyperlipidemia   . Hypertension   . Other abnormality of red blood cells   . Rotator cuff syndrome of left shoulder   . Stroke (HCC)    Past Surgical History:  Procedure Laterality Date  . CARDIAC CATHETERIZATION    . COLONOSCOPY WITH PROPOFOL N/A 01/30/2020   Procedure: COLONOSCOPY WITH PROPOFOL;  Surgeon: Hung, Patrick, MD;  Location: [facility];  Service: Endoscopy;  Laterality: N/A;  . CORONARY ARTERY BYPASS GRAFT     triple  . CORONARY ARTERY BYPASS GRAFT  2008  . POLYPECTOMY  01/30/2020   Procedure: POLYPECTOMY;  Surgeon: Hung, Patrick, MD;  Location: [facility];  Service: Endoscopy;;  . TUBAL LIGATION      Allergies  Allergies  Allergen Reactions  . Clonidine Derivatives Other (See Comments)    Patch causes scars  . Spironolactone Rash    History of Present Illness    [name] has a PMH of hypertrophic cardiomyopathy, essential hypertension, coronary artery disease, atrial fibrillation with RVR, CVA, ascending aortic dilation, AKI, dyslipidemia, dehydration, and abdominal wall pain.  She is status post CABG x3 2008.  She had a CVA with PCA subacute infarct in 2019.  She was seen by Dr. Hochrein and complained of progressive weight loss and shortness of breath.  She was sent for a nuclear stress test with demonstrated  previous infarct but no ischemia.  She was seen again by Dr. Hochrein on 07/28/2020.  During that time she denied chest pain, pressure, arm and neck discomfort.  She did report occasional palpitations but denied presyncope and syncope.  She denied weight gain and edema.  She continued to smoke.  She reported dyspnea with exertion.  Due to previous CVA she mainly used a wheelchair to get around but occasionally used a walker.  She denied PND and orthopnea.   She presented to the emergency department on 08/19/2020 and was discharged on 08/22/2020.  On presentation she complained of right upper quadrant and right lower quadrant pain that was associated with intermittent periods of nausea and vomiting.  She denied diarrhea but reported constipation.  She was found to be in atrial fibrillation with RVR.  She converted to sinus rhythm 08/20/2020 and her Cardizem GTT was discontinued and she was started on Lopressor 25 mg daily.  She was also continued on her apixaban.  Her TSH was normal and her echocardiogram 08/19/2020 showed an LVEF of 65 to 70%.  It was felt that her GI pain secondary to abdominal wall pain versus IBS.  She was started on Bentyl and had improvement in her symptoms.  She was tolerating her diet well was no longer constipated and was discharged in stable condition on 08/22/2020.  She presents the clinic today for follow-up evaluation states she does note increased shortness of breath with increased physical   activity.  She does not notice any chest pain and has had mild fatigue.  Her daughter reports that she will wake up at 3 AM and then goes to bed early afternoon or late morning.  She denies bleeding issues.  She has started smoking again but is trying to eat a low-salt diet.  We reviewed her echocardiogram.  She reports compliance with apixaban.  Her caregiver is also present and states that she has had the patient's medications placed in pill packs for easier distribution.  We used shared decision  making to discuss DCCV.  Pulse on EKG shows 133 bpm and on recheck pulse as 98 bpm.  At this time I will refer to the atrial fibrillation clinic and increase her metoprolol.  I have asked her to stop smoking and avoid triggers.  I will give her the salty 6 diet sheet, have her follow-up with atrial fibrillation clinic and Dr. Hochrein in 3 months.  Today she denies chest pain, shortness of breath, lower extremity edema, fatigue, palpitations, melena, hematuria, hemoptysis, diaphoresis, weakness, presyncope, syncope, orthopnea, and PND.   Home Medications    Prior to Admission medications   Medication Sig Start Date End Date Taking? Authorizing Provider  amLODipine (NORVASC) 2.5 MG tablet Take 3 tablets (7.5 mg total) by mouth daily. 08/23/20 09/22/20  Hall, Carole N, DO  amoxicillin (AMOXIL) 875 MG tablet Take 1 tablet (875 mg total) by mouth 2 (two) times daily. 09/07/20   Moore, [name], FNP  apixaban (ELIQUIS) 5 MG TABS tablet Take 1 tablet (5 mg total) by mouth 2 (two) times daily. 07/14/20   Moore, [name], FNP  aspirin (QC LO-DOSE ASPIRIN) 81 MG EC tablet Take 1 tablet (81 mg total) by mouth daily. Swallow whole. 07/14/20   Moore, [name], FNP  atorvastatin (LIPITOR) 80 MG tablet Take 1 tablet (80 mg total) by mouth daily. 09/27/20   Moore, [name], FNP  Blood Pressure Monitoring (BLOOD PRESSURE KIT) DEVI Use as directed to check blood pressure daily dx: I10 01/20/20   Moore, [name], FNP  dicyclomine (BENTYL) 10 MG capsule Take 1 capsule (10 mg total) by mouth 4 (four) times daily -  before meals and at bedtime for 10 days. 08/22/20 09/01/20  Hall, Carole N, DO  ferrous sulfate 325 (65 FE) MG tablet Take 1 tablet (325 mg total) by mouth daily with breakfast. 08/22/20 09/21/20  Hall, Carole N, DO  meclizine (ANTIVERT) 25 MG tablet Take 1 tablet (25 mg total) by mouth 3 (three) times daily as needed for dizziness. 04/25/20   Haviland, Julie, MD  metoprolol tartrate (LOPRESSOR) 25 MG tablet Take 1 tablet (25 mg  total) by mouth 2 (two) times daily. 08/22/20 09/21/20  Hall, Carole N, DO  Multiple Vitamin (MULTIVITAMIN) capsule Take 1 capsule by mouth daily.    [provider]  omeprazole (PRILOSEC) 20 MG capsule Take 1 capsule (20 mg total) by mouth 2 (two) times daily before a meal. 05/17/20   Moore, [name], FNP  polyethylene glycol (MIRALAX / GLYCOLAX) 17 g packet Take 17 g by mouth daily. 08/23/20   Hall, Carole N, DO    Family History    Family History  Problem Relation Age of Onset  . Hypertension Mother   . Heart attack Father   . Diabetes Father   . Kidney failure Father   . Anxiety disorder Sister   . Sarcoidosis Sister    She indicated that her mother is deceased. She indicated that her father is deceased. She indicated that   both of her sisters are alive. She indicated that her daughter is alive. She indicated that her son is alive.  Social History    Social History   Socioeconomic History  . Marital status: Single    Spouse name: Not on file  . Number of children: 2  . Years of education: Not on file  . Highest education level: 11th grade  Occupational History  . Occupation: retired  Tobacco Use  . Smoking status: Former Smoker    Packs/day: 2.00    Years: 35.00    Pack years: 70.00    Types: Cigarettes    Quit date: 08/12/2019    Years since quitting: 1.1  . Smokeless tobacco: Former User    Quit date: 08/09/2008  . Tobacco comment: had assistance with the quit smoking - had patches and lozenges  Vaping Use  . Vaping Use: Never used  Substance and Sexual Activity  . Alcohol use: Yes    Comment: ocassionally wine  . Drug use: Yes    Types: Marijuana    Comment: history of cocaine use  . Sexual activity: Not on file  Other Topics Concern  . Not on file  Social History Narrative   Patient is right-handed. She lives alone in a one level home, 2 steps to enter. Her family is very active in her care.    Social Determinants of Health   Financial Resource  Strain: Not on file  Food Insecurity: Not on file  Transportation Needs: Not on file  Physical Activity: Not on file  Stress: Not on file  Social Connections: Not on file  Intimate Partner Violence: Not on file     Review of Systems    General:  No chills, fever, night sweats or weight changes.  Cardiovascular:  No chest pain, dyspnea on exertion, edema, orthopnea, palpitations, paroxysmal nocturnal dyspnea. Dermatological: No rash, lesions/masses Respiratory: No cough, dyspnea Urologic: No hematuria, dysuria Abdominal:   No nausea, vomiting, diarrhea, bright red blood per rectum, melena, or hematemesis Neurologic:  No visual changes, wkns, changes in mental status. All other systems reviewed and are otherwise negative except as noted above.  Physical Exam    VS:  BP 118/72   Pulse 98   Ht 5' 7" (1.702 m)   Wt 118 lb (53.5 kg)   SpO2 98%   BMI 18.48 kg/m  , BMI Body mass index is 18.48 kg/m. GEN: Well nourished, well developed, in no acute distress. HEENT: normal. Neck: Supple, no JVD, carotid bruits, or masses. Cardiac: RRR, no murmurs, rubs, or gallops. No clubbing, cyanosis, edema.  Radials/DP/PT 2+ and equal bilaterally.  Respiratory:  Respirations regular and unlabored, clear to auscultation bilaterally. GI: Soft, nontender, nondistended, BS + x 4. MS: no deformity or atrophy. Skin: warm and dry, no rash. Neuro:  Strength and sensation are intact. Psych: Normal affect.  Accessory Clinical Findings    Recent Labs: 08/19/2020: TSH 0.897 08/20/2020: Magnesium 2.4 08/21/2020: ALT 22; BUN 17; Creatinine, Ser 0.97; Hemoglobin 10.2; Platelets 199; Potassium 4.2; Sodium 137   Recent Lipid Panel    Component Value Date/Time   CHOL 114 09/02/2019 1651   TRIG 58 09/02/2019 1651   HDL 47 09/02/2019 1651   CHOLHDL 2.4 09/02/2019 1651   CHOLHDL 3.6 02/14/2018 0314   VLDL 16 02/14/2018 0314   LDLCALC 54 09/02/2019 1651    ECG personally reviewed by me today-atrial  fibrillation 133 bpm ST and T wave abnormality consider inferior lateral ischemia  Carotid duplex 03/23/2020 IMPRESSION:   1. Again noted are occluded bilateral ICAs as was demonstrated on the patient's CTA dated 2019. 2. Low resistance waveforms involving the bilateral ECA is consistent with internalization of the ECAs. 3. Antegrade flow is noted within the vertebral arteries.  Echocardiogram 08/19/2020  IMPRESSIONS    1. Left ventricular ejection fraction, by estimation, is 65 to 70%. The  left ventricle has normal function. The left ventricle has no regional  wall motion abnormalities. There is mild concentric left ventricular  hypertrophy. Left ventricular diastolic  function could not be evaluated.  2. Right ventricular systolic function is normal. The right ventricular  size is normal. There is normal pulmonary artery systolic pressure. The  estimated right ventricular systolic pressure is 17.6 mmHg.  3. Left atrial size was moderately dilated.  4. Right atrial size was mildly dilated.  5. The mitral valve is normal in structure. Mild mitral valve  regurgitation. No evidence of mitral stenosis.  6. Tricuspid valve regurgitation is mild to moderate.  7. The aortic valve is normal in structure. Aortic valve regurgitation is  not visualized. No aortic stenosis is present.  8. Aortic dilatation noted. There is mild dilatation of the ascending  aorta, measuring 42 mm.  9. The inferior vena cava is normal in size with greater than 50%  respiratory variability, suggesting right atrial pressure of 3 mmHg.  Assessment & Plan   1.  Paroxysmal atrial fibrillation-EKG today shows atrial fibrillation with rapid ventricular rate 133 bpm.  On recheck 98 bpm has had no further episodes of accelerated heart rate or irregular heartbeat since being discharged from the emergency department.  Denies bleeding issues and reports compliance with her apixaban.  CHA2DS2-VASc score 4 Increase  metoprolol to 50 mg BID Continue apixaban, aspirin Heart healthy low-sodium diet-salty 6 given Increase physical activity as tolerated  Essential hypertension-BP today 118/72.  Well-controlled at home. Continue amlodipine, metoprolol, Heart healthy low-sodium diet-salty 6 given Increase physical activity as tolerated  Carotid artery stenosis-occluded bilateral ICA's 03/23/2020.  Medical management recommended Continue atorvastatin, aspirin Heart healthy low-sodium diet-salty 6 given Increase physical activity as tolerated  Hyperlipidemia-LDL 54 on 09/02/2019 Continue atorvastatin, aspirin Heart healthy low-sodium high-fiber diet Increase physical activity as tolerated  Tobacco abuse-continues to smoke more than a pack per day. Smoking cessation recommended/smoking cessation information given.  Disposition: Follow-up with Dr. Hochrein in 3 months.  Follow-up with A. fib clinic in 2 weeks.   M.  NP-C    09/30/2020, 10:48 AM [facility] [address] Office [phone] Fax [phone]  Notice: This dictation was prepared with Dragon dictation along with smaller phrase technology. Any transcriptional errors that result from this process are unintentional and may not be corrected upon review.  I spent 15 minutes examining this patient, reviewing medications, and using patient centered shared decision making involving her cardiac care.  Prior to her visit I spent greater than 20 minutes reviewing her past medical history,  medications, and prior cardiac tests.  

## 2020-09-30 ENCOUNTER — Ambulatory Visit (INDEPENDENT_AMBULATORY_CARE_PROVIDER_SITE_OTHER): Payer: Medicaid Other

## 2020-09-30 ENCOUNTER — Other Ambulatory Visit: Payer: Self-pay

## 2020-09-30 ENCOUNTER — Ambulatory Visit (INDEPENDENT_AMBULATORY_CARE_PROVIDER_SITE_OTHER): Payer: Medicaid Other | Admitting: General Practice

## 2020-09-30 ENCOUNTER — Encounter: Payer: Self-pay | Admitting: General Practice

## 2020-09-30 ENCOUNTER — Telehealth: Payer: Self-pay

## 2020-09-30 VITALS — BP 118/72 | HR 98 | Ht 67.0 in | Wt 118.0 lb

## 2020-09-30 DIAGNOSIS — E785 Hyperlipidemia, unspecified: Secondary | ICD-10-CM

## 2020-09-30 DIAGNOSIS — I480 Paroxysmal atrial fibrillation: Secondary | ICD-10-CM

## 2020-09-30 DIAGNOSIS — Z23 Encounter for immunization: Secondary | ICD-10-CM | POA: Diagnosis not present

## 2020-09-30 DIAGNOSIS — I10 Essential (primary) hypertension: Secondary | ICD-10-CM

## 2020-09-30 DIAGNOSIS — I2510 Atherosclerotic heart disease of native coronary artery without angina pectoris: Secondary | ICD-10-CM | POA: Diagnosis not present

## 2020-09-30 DIAGNOSIS — Z720 Tobacco use: Secondary | ICD-10-CM

## 2020-09-30 MED ORDER — METOPROLOL TARTRATE 50 MG PO TABS: 50.0000 mg | ORAL_TABLET | Freq: Two times a day (BID) | ORAL | 3 refills | Status: DC

## 2020-09-30 NOTE — Patient Instructions (Addendum)
Medication Instructions:  The current medical regimen is effective;  continue present plan and medications as directed. Please refer to the Current Medication list given to you today.  *If you need a refill on your cardiac medications before your next appointment, please call your pharmacy*  Lab Work:   Testing/Procedures:  NONE    NONE  Special Instructions Please try to avoid these triggers:  Do not use any products that have nicotine or tobacco in them. These include cigarettes, e-cigarettes, and chewing tobacco. If you need help quitting, ask your doctor.  Eat heart-healthy foods. Talk with your doctor about the right eating plan for you.  Exercise regularly as told by your doctor.  Stay hydrated  Do not drink alcohol, Caffeine or chocolate.  Lose weight if you are overweight.  Do not use drugs, including cannabis   PLEASE READ AND FOLLOW SMOKING CESSATION TIPS-ATTACHED  PLEASE READ AND FOLLOW SALTY 6-ATTACHED-1,800mg  daily  Follow-Up: FOLLOW UP WITH AFIB CLINIC WITHIN 2 WEEKS  Your next appointment:  3 month(s) In Person with [name], MD OR IF UNAVAILABLE [employer], FNP-C  At [facility], you and your health needs are our priority.  As part of our continuing mission to provide you with exceptional heart care, we have created designated Provider Care Teams.  These Care Teams include your primary Cardiologist (physician) and Advanced Practice Providers (APPs -  Physician Assistants and Nurse Practitioners) who all work together to provide you with the care you need, when you need it.  We recommend signing up for the patient portal called "MyChart".  Sign up information is provided on this After Visit Summary.  MyChart is used to connect with patients for Virtual Visits (Telemedicine).  Patients are able to view lab/test results, encounter notes, upcoming appointments, etc.  Non-urgent messages can be sent to your provider as well.   To learn more about what you  can do with MyChart, go to [email or web address].              6 SALTY THINGS TO AVOID     1,800MG  DAILY     Steps to Quit Smoking Smoking tobacco is the leading cause of preventable death. It can affect almost every organ in the body. Smoking puts you and people around you at risk for many serious, long-lasting (chronic) diseases. Quitting smoking can be hard, but it is one of the best things that you can do for your health. It is never too late to quit. How do I get ready to quit? When you decide to quit smoking, make a plan to help you succeed. Before you quit:  Pick a date to quit. Set a date within the next 2 weeks to give you time to prepare.  Write down the reasons why you are quitting. Keep this list in places where you will see it often.  Tell your family, friends, and co-workers that you are quitting. Their support is important.  Talk with your doctor about the choices that may help you quit.  Find out if your health insurance will pay for these treatments.  Know the people, places, things, and activities that make you want to smoke (triggers). Avoid them. What first steps can I take to quit smoking?  Throw away all cigarettes at home, at work, and in your car.  Throw away the things that you use when you smoke, such as ashtrays and lighters.  Clean your car. Make sure to empty the ashtray.  Clean your home, including curtains and  carpets. What can I do to help me quit smoking? Talk with your doctor about taking medicines and seeing a counselor at the same time. You are more likely to succeed when you do both.  If you are pregnant or breastfeeding, talk with your doctor about counseling or other ways to quit smoking. Do not take medicine to help you quit smoking unless your doctor tells you to do so. To quit smoking: Quit right away  Quit smoking totally, instead of slowly cutting back on how much you smoke over a period of time.  Go to counseling. You are more  likely to quit if you go to counseling sessions regularly. Take medicine You may take medicines to help you quit. Some medicines need a prescription, and some you can buy over-the-counter. Some medicines may contain a drug called nicotine to replace the nicotine in cigarettes. Medicines may:  Help you to stop having the desire to smoke (cravings).  Help to stop the problems that come when you stop smoking (withdrawal symptoms). Your doctor may ask you to use:  Nicotine patches, gum, or lozenges.  Nicotine inhalers or sprays.  Non-nicotine medicine that is taken by mouth. Find resources Find resources and other ways to help you quit smoking and remain smoke-free after you quit. These resources are most helpful when you use them often. They include:  Online chats with a Social worker.  Phone quitlines.  Printed Furniture conservator/restorer.  Support groups or group counseling.  Text messaging programs.  Mobile phone apps. Use apps on your mobile phone or tablet that can help you stick to your quit plan. There are many free apps for mobile phones and tablets as well as websites. Examples include Quit Guide from the [employer] and smokefree.gov   What things can I do to make it easier to quit?  Talk to your family and friends. Ask them to support and encourage you.  Call a phone quitline (1-800-QUIT-NOW), reach out to support groups, or work with a Social worker.  Ask people who smoke to not smoke around you.  Avoid places that make you want to smoke, such as: ? Bars. ? Parties. ? Smoke-break areas at work.  Spend time with people who do not smoke.  Lower the stress in your life. Stress can make you want to smoke. Try these things to help your stress: ? Getting regular exercise. ? Doing deep-breathing exercises. ? Doing yoga. ? Meditating. ? Doing a body scan. To do this, close your eyes, focus on one area of your body at a time from head to toe. Notice which parts of your body are tense. Try to  relax the muscles in those areas.   How will I feel when I quit smoking? Day 1 to 3 weeks Within the first 24 hours, you may start to have some problems that come from quitting tobacco. These problems are very bad 2-3 days after you quit, but they do not often last for more than 2-3 weeks. You may get these symptoms:  Mood swings.  Feeling restless, nervous, angry, or annoyed.  Trouble concentrating.  Dizziness.  Strong desire for high-sugar foods and nicotine.  Weight gain.  Trouble pooping (constipation).  Feeling like you may vomit (nausea).  Coughing or a sore throat.  Changes in how the medicines that you take for other issues work in your body.  Depression.  Trouble sleeping (insomnia). Week 3 and afterward After the first 2-3 weeks of quitting, you may start to notice more positive results, such  as:  Better sense of smell and taste.  Less coughing and sore throat.  Slower heart rate.  Lower blood pressure.  Clearer skin.  Better breathing.  Fewer sick days. Quitting smoking can be hard. Do not give up if you fail the first time. Some people need to try a few times before they succeed. Do your best to stick to your quit plan, and talk with your doctor if you have any questions or concerns. Summary  Smoking tobacco is the leading cause of preventable death. Quitting smoking can be hard, but it is one of the best things that you can do for your health.  When you decide to quit smoking, make a plan to help you succeed.  Quit smoking right away, not slowly over a period of time.  When you start quitting, seek help from your doctor, family, or friends. This information is not intended to replace advice given to you by your health care provider. Make sure you discuss any questions you have with your health care provider. Document Revised: 04/25/2019 Document Reviewed: 10/19/2018 Elsevier Patient Education  [place].

## 2020-09-30 NOTE — Chronic Care Management (AMB) (Addendum)
09/30/2020- Called patient's sister [name] to follow up on onboarding with [facility]. Per sister, cost of medications were getting to be too much for her to handle but she likes the packaging and home delivery service with [facility]. [name] also mentioned that patient used to get all of her medications for free from [facility], they had a program where medicaid copays were being waived but they do not package or deliver. [place] to check if this program was available for Medicaid patients in the pharmacy. Spoke with Lauren, and this option is not available, [facility] does not charge for packaging or home delivery, just the medicaid copay.  Sister aware, she is reaching out to [facility] to see what extra fee's they have and then she would know how much it would cost her monthly with [facility] minus the extra fees from [facility].  Missed patient call, Called patient back twice, waiting on return call.   Sister called back, she informed me that [facility] does not have any fees for delivery so they will decide for now to stay with them since fees and packaging are all the same, [facility] also home delivers so there is no change in patient's medication process but she will let us know if they decide to come to [facility] in the future.  [name], CPP notified.  [name], [facility] Pharmacist Assistant [phone]  I have reviewed the care management and care coordination activities outlined in this encounter and I am certifying that I agree with the content of this note. No further action required.    Mayford Knife, [facility] 10/14/20 1:55 PM

## 2020-09-30 NOTE — Progress Notes (Signed)
   Covid-19 Vaccination Clinic  Name:  Kimberly [name]    MRN: [id number] DOB: 1956-06-06  09/30/2020  Kimberly [name] was observed post Covid-19 immunization for 15 minutes without incident. She was provided with Vaccine Information Sheet and instruction to access the V-Safe system.   Kimberly [name] was instructed to call 911 with any severe reactions post vaccine: [name] Difficulty breathing  . Swelling of face and throat  . A fast heartbeat  . A bad rash all over body  . Dizziness and weakness   Immunizations Administered    Name Date Dose VIS Date Route   Moderna Covid-19 Booster Vaccine 09/30/2020  9:11 AM 0.25 mL 06/02/2020 Intramuscular   Manufacturer: Moderna   Lot: [id number]   [place]: [id number]

## 2020-10-01 ENCOUNTER — Telehealth: Payer: Self-pay | Admitting: General Practice

## 2020-10-01 ENCOUNTER — Ambulatory Visit: Payer: Medicaid Other

## 2020-10-01 NOTE — Telephone Encounter (Signed)
Spoke with patient reappointment to see the A Fib clinic as ordered by Coletta Memos, NP----Thursday 10/14/20 at 9:00 am---arrival time is 8:45 am for check in.  Patient to enter thru the main entrance of the hospital and stop at the desk for directions.

## 2020-10-04 ENCOUNTER — Other Ambulatory Visit: Payer: Self-pay | Admitting: General Practice

## 2020-10-05 ENCOUNTER — Ambulatory Visit: Payer: Self-pay

## 2020-10-05 ENCOUNTER — Ambulatory Visit: Payer: Medicaid Other

## 2020-10-05 DIAGNOSIS — I4891 Unspecified atrial fibrillation: Secondary | ICD-10-CM

## 2020-10-05 DIAGNOSIS — F32A Depression, unspecified: Secondary | ICD-10-CM

## 2020-10-05 DIAGNOSIS — I10 Essential (primary) hypertension: Secondary | ICD-10-CM

## 2020-10-05 DIAGNOSIS — K219 Gastro-esophageal reflux disease without esophagitis: Secondary | ICD-10-CM

## 2020-10-05 DIAGNOSIS — R634 Abnormal weight loss: Secondary | ICD-10-CM

## 2020-10-07 NOTE — Patient Instructions (Signed)
Goals Addressed      Patient Stated   .  to feel less depressed and hopeless (pt-stated)   On track     Timeframe:  Long-Range Goal Priority:  High Start Date: 07/21/20                            Expected End Date: 01/19/21                      Follow Up Date: 11/16/20   - stay in touch with my family and friends - follow up with PCP for ongoing evaluation and treatment of depression     Why is this important?    As we age, or sometimes because we have an illness, it feels like our memory and ability to figure things out is not very good.   There are things you can do to keep your memory and your thinking as strong as possible.          Other   .  Acheive and Maintain adequete weight gain   On track     Timeframe:  Short-Term Goal Priority:  High Start Date: 07/21/20                            Expected End Date: 01/19/21                      Follow Up Date: 11/16/20  Patient Goals/Self-Care Activities:   - set goal weight  - obtain weight scale for home use - monitor weights daily and record, weighing the same time each morning - follow up with the Registered Dietician to help monitor Abnormal weight loss  - Continue drinking high protein oral supplements as prescribed    Why is this important?    When you are ready to manage your nutrition or weight, having a plan and setting goals will help.   Taking small steps to change how you eat and exercise is a good place to start.        .  Track and Manage My Blood Pressure-Hypertension   On track     Timeframe:  Long-Range Goal Priority:  High Start Date: 07/21/20                            Expected End Date: 01/19/21                       Follow Up Date: 11/16/20 Patient Goals/Self-Activities:   - Obtain a BP cuff for home use   - Self administer medications as prescribed  - Attend all scheduled provider appointments  - Call provider office for new concerns, questions, or BP outside discussed parameters  - Check BP and  record as discussed  - Follows a low sodium diet   Why is this important?    You won't feel high blood pressure, but it can still hurt your blood vessels.   High blood pressure can cause heart or kidney problems. It can also cause a stroke.   Making lifestyle changes like losing a Nickol Collister weight or eating less salt will help.   Checking your blood pressure at home and at different times of the day can help to control blood pressure.   If the doctor prescribes medicine remember to take it  the way the doctor ordered.   Call the office if you cannot afford the medicine or if there are questions about it.

## 2020-10-07 NOTE — Chronic Care Management (AMB) (Signed)
Care Management    RN Visit Note  10/05/2020 Name: Kimberly [name] MRN: [id number] DOB: May 10, 1956  Subjective: Kimberly [name] is a 65 y.o. year old female who is a primary care patient of Kimberly [name], [place]. The care management team was consulted for assistance with disease management and care coordination needs.    Engaged with patient by telephone for follow up visit in response to provider referral for case management and/or care coordination services.   Consent to Services:   [name] was given information about Care Management services today including:  1. Care Management services includes personalized support from designated clinical staff supervised by her physician, including individualized plan of care and coordination with other care providers 2. 24/7 contact phone numbers for assistance for urgent and routine care needs. 3. The patient may stop case management services at any time by phone call to the office staff.  Patient agreed to services and consent obtained.   Assessment: Review of patient past medical history, allergies, medications, health status, including review of consultants reports, laboratory and other test data, was performed as part of comprehensive evaluation and provision of chronic care management services.   SDOH (Social Determinants of Health) assessments and interventions performed:  Yes, patient is active with embedded BSW  Care Plan  Allergies  Allergen Reactions  . Clonidine Derivatives Other (See Comments)    Patch causes scars  . Spironolactone Rash    Outpatient Encounter Medications as of 10/05/2020  Medication Sig  . amLODipine (NORVASC) 2.5 MG tablet TAKE 3 TABLETS BY MOUTH EVERY DAY  . amoxicillin (AMOXIL) 875 MG tablet Take 1 tablet (875 mg total) by mouth 2 (two) times daily.  [name] apixaban (ELIQUIS) 5 MG TABS tablet Take 1 tablet (5 mg total) by mouth 2 (two) times daily.  [name] aspirin (QC LO-DOSE ASPIRIN) 81 MG EC tablet Take 1  tablet (81 mg total) by mouth daily. Swallow whole.  [name] atorvastatin (LIPITOR) 80 MG tablet Take 1 tablet (80 mg total) by mouth daily.  . Blood Pressure Monitoring (BLOOD PRESSURE KIT) DEVI Use as directed to check blood pressure daily dx: I10  . FEROSUL 325 (65 Fe) MG tablet TAKE 1 TABLET BY MOUTH EVERY DAY WITH BREAKFAST  . meclizine (ANTIVERT) 25 MG tablet Take 1 tablet (25 mg total) by mouth 3 (three) times daily as needed for dizziness.  . metoprolol tartrate (LOPRESSOR) 50 MG tablet Take 1 tablet (50 mg total) by mouth 2 (two) times daily.  . Multiple Vitamin (MULTIVITAMIN) capsule Take 1 capsule by mouth daily.  [name] omeprazole (PRILOSEC) 20 MG capsule Take 1 capsule (20 mg total) by mouth 2 (two) times daily before a meal.  . polyethylene glycol (MIRALAX / GLYCOLAX) 17 g packet Take 17 g by mouth daily.   No facility-administered encounter medications on file as of 10/05/2020.    Patient Active Problem List   Diagnosis Date Noted  . Right sided abdominal pain   . Abdominal wall pain   . Atrial fibrillation ([place]) 08/19/2020  . Educated about COVID-19 virus infection 11/16/2019  . Urinary frequency 04/14/2019  . Cough 04/14/2019  . AKI (acute kidney injury) ([place]) 11/26/2018  . Diarrhea 11/25/2018  . Dehydration, mild 11/25/2018  . Hemiparesis affecting nondominant 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 ([place]) 07/31/2018  . Muscle ache 07/31/2018  . Prediabetes 07/31/2018  . Chronic gout without tophus 07/31/2018  . Vision loss 07/31/2018  . Malnutrition of moderate degree 02/15/2018  . Carotid occlusion, bilateral   .  Atrial fibrillation with RVR ([place]) 02/13/2018  . Positive D dimer 02/13/2018  . Back pain 02/13/2018  . CVA (cerebral vascular accident) ([place]) 02/13/2018  . Paroxysmal atrial fibrillation ([place]) 02/13/2018  . Coronary artery disease involving native coronary artery of native heart without angina pectoris 02/13/2018  . 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 Aortic atherosclerosis ([place]) 02/13/2018  . CAD, ARTERY BYPASS GRAFT 05/03/2009  . TOBACCO USE, QUIT 05/03/2009  . ROTATOR CUFF SYNDROME, LEFT 02/12/2009  . Shoulder pain, left 11/03/2008  . HAMMER TOE, ACQUIRED 02/11/2008  . INSECT BITE, LEG 02/11/2008  . Cerebral artery occlusion with cerebral infarction ([place]) 12/05/2007  . Dyslipidemia 11/05/2007  . UNSPECIFIED DISORDER TEETH&SUPPORTING STRUCTURES 11/05/2007  . BICEPS TENDINITIS, RIGHT 04/24/2007  . Essential hypertension 04/23/2007  . MICROCYTOSIS 04/23/2007    Conditions to be addressed/monitored: HTN, Depression and Afib, abnormal weight loss, GERD  Care Plan : Wellness (Adult)  Updates made by Lynne Logan, RN since 10/07/2020 12:00 AM    Problem: Cognitive Function (Wellness)   Priority: High    Long-Range Goal: Cognitive Function Enhanced   Start Date: 07/21/2020  Expected End Date: 01/19/2021  Recent Progress: On track  Priority: High  Note:   Current Barriers:   Ineffective Self Health Maintenance  Unable to self administer medications as prescribed  Lacks social connections  Currently UNABLE TO independently self manage needs related to chronic health conditions.   Knowledge Deficits related to short term plan for care coordination needs and long term plans for chronic disease management needs Nurse Case Manager Clinical Goal(s):   Over the next 180 days, patient will work with care management team to address care coordination and chronic disease management needs related to Disease Management  Psychosocial Support  Caregiver Stress support   Interventions:  10/05/20 completed call with sister Kimberly [name]  One on one collaboration to establish plan of care to address care coordination and disease management needs with clinical colleague [name] BSW.   Collaborate with PCP Kimberly Brine, FNP regarding Rx needed for weight scales  for in home weight checks and BP cuff for self monitoring of BP  Assessed for changes in cognitive function; Encouraged physical activity or exercise based on ability and tolerance  Determined patient's daughter is staying with her from out of town but has taken some time off work  Determined patient's mood and cognitive function have improved since having her daughter stay and care for her  Discussed plans with patient for ongoing care management follow up and provided patient with direct contact information for care management team Patient Goals/Self Care Activities: - stay in touch with my family and friends - follow up with PCP for ongoing evaluation and treatment of depression and or decline in cognitive ability/thinking   -Implement daily exercise routine as tolerated -Participate in cognitively stimulating activity such as card playing with caregiver/family  Follow Up Plan: Telephone follow up appointment with care management team member scheduled for: 11/16/20   Problem: Healthy Nutrition (Wellness)   Priority: High    Long-Range Goal: Healthy Nutrition Achieved   Start Date: 07/21/2020  Expected End Date: 01/19/2021  This Visit's Progress: On track  Recent Progress: On track  Priority: High  Note:   Current Barriers:   Ineffective Self Health Maintenance  Unable to self administer medications as prescribed  Lacks social connections  Currently UNABLE TO independently self manage needs related to chronic health conditions.   Knowledge Deficits related  to short term plan for care coordination needs and long term plans for chronic disease management needs Nurse Case Manager Clinical Goal(s):   Over the next 180 days, patient will work with care management team to address care coordination and chronic disease management needs related to Disease Management  Other Nutritional Support to promote healthy weight gain     Interventions:  10/05/20 successful call completed with  sister Kimberly [name]   Recommended to set healthy weight goal based on body mass index.   Reviewed current dietary intake and exercise levels.   Encouraged small steps toward making change to eating and exercising.   Collaborated with PCP to obtain Rx for weight scale in order to self monitor weights - Rx sent to [facility] in [place], [place] with PCP for consideration of adding a prescription appetite stimulate to patients pharmacological regimen   Determined patient's daughter has taken some time off work and is staying with patient temporarily to help care for her; daughter is able to prepare home cooked meals for patient and is overseeing that she is eating/drinking enough calories/liquids   Discussed plans with patient for ongoing care management follow up and provided patient with direct contact information for care management team Patient Goals/Self Care Activities:  - set goal weight  - obtain weight scale for home use - monitor weights daily and record, weighing the same time each morning - follow up with the Registered Dietician to help monitor Abnormal weight loss  - Continue drinking high protein oral supplements as prescribed   Follow Up Plan: Telephone follow up appointment with care management team member scheduled for: 11/16/20   Care Plan : Depression (Adult)  Updates made by Lynne Logan, RN since 10/07/2020 12:00 AM    Problem: Symptoms (Depression)   Priority: High    Long-Range Goal: Symptoms Monitored and Managed   Start Date: 07/21/2020  Expected End Date: 01/19/2021  This Visit's Progress: On track  Recent Progress: On track  Priority: High  Note:   Current Barriers:   Ineffective Self Health Maintenance  Unable to self administer medications as prescribed  Lacks social connections  Currently UNABLE TO independently self manage needs related to chronic health conditions.   Knowledge Deficits related to short term plan for care  coordination needs and long term plans for chronic disease management needs Nurse Case Manager Clinical Goal(s):   Over the next 180 days, patient will work with care management team to address care coordination and chronic disease management needs related to Disease Management  Educational Needs  Psychosocial Support   Interventions:  10/05/20 successful call completed with sister Kimberly [name]   Determined patient's cognition and mood are stable  Discussed patient's daughter is currently taking time off work and is staying with Kimberly [name] to help care for her, this has improved patient's mood  Discussed the family is visiting patient more often and taking her for Sunday drives as well as getting her out of the house more often for dinner dates  Encouraged sister Kimberly [name] to continue to monitor patient's condition and notify the CCM team and the PCP of any/all concerns   Discussed plans with patient for ongoing care management follow up and provided patient with direct contact information for care management team Patient Goals/Self Care Activities:  - stay in touch with my family and friends - follow up with PCP for ongoing evaluation and treatment of depression    Follow Up Plan: Telephone follow up appointment with care management  team member scheduled for: 11/16/20    Care Plan : Hypertension (Adult)  Updates made by Lynne Logan, RN since 10/07/2020 12:00 AM    Problem: Resistant Hypertension (Hypertension)   Priority: High    Long-Range Goal: Response to Treatment Maximized   Start Date: 07/21/2020  Expected End Date: 01/19/2021  Recent Progress: On track  Priority: High  Note:   Current Barriers:  [name] Knowledge Deficits related to basic understanding of hypertension pathophysiology and self care management . Unable to self administer medications as prescribed . Lacks Engineer, maintenance (IT) Case Manager Clinical Goal(s):  [name] Over the next 180 days, patient will verbalize basic  understanding of hypertension disease process and self health management plan as evidenced by patient's caregiver will obtain a BP cuff for home use and verbalize knowledge related to when to contact the PCP for abnormal BP readings. Interventions:  10/05/20 call completed with sister Kimberly [name]  . Evaluation of current treatment plan related to hypertension self management and patient's adherence to plan as established by provider. . Reviewed medications with caregiver and discussed importance of compliance . Advised caregiver, providing education and rationale, to monitor blood pressure daily and record, calling PCP for findings outside established parameters.  Durene Cal in basket message to PCP provider requesting an Rx for BP cuff be sent to [facility], sister is agreeable to pick up the cuff once ready  . Discussed plans with patient for ongoing care management follow up and provided patient with direct contact information for care management team Patient Goals/Self-Care Activities:  Over the next 180 days, patient will:  - Obtain a BP cuff for home use   - Self administer medications as prescribed  - Attend all scheduled provider appointments  - Call provider office for new concerns, questions, or BP outside discussed parameters  - Check BP and record as discussed  - Follows a low sodium diet  Follow Up Plan: Telephone follow up appointment with care management team member scheduled for: 11/16/20     Plan: Telephone follow up appointment with care management team member scheduled for:  11/16/20  Barb Merino, RN, BSN, CCM Care Management Coordinator [facility]  Direct Phone: [phone]

## 2020-10-13 ENCOUNTER — Ambulatory Visit: Payer: Medicaid Other

## 2020-10-13 DIAGNOSIS — R634 Abnormal weight loss: Secondary | ICD-10-CM

## 2020-10-13 DIAGNOSIS — I10 Essential (primary) hypertension: Secondary | ICD-10-CM

## 2020-10-13 DIAGNOSIS — F32A Depression, unspecified: Secondary | ICD-10-CM

## 2020-10-13 NOTE — Progress Notes (Signed)
Primary Care Physician: [name], FNP Primary Cardiologist: Dr Percival Spanish  Primary Electrophysiologist: none Referring Physician: Coletta Memos   Kimberly [name] is a 65 y.o. female with a history of essential hypertension, coronary artery disease s/p CABG 2008, atrial fibrillation, CVA, ascending aortic dilation, dyslipidemia, and tobacco abuse who presents for consultation in the [facility]. Patient is on Eliquis for a CHADS2VASC score of 5. She presented to the emergency department on 08/19/2020 and was discharged on 08/22/2020.  On presentation she complained of right upper quadrant and right lower quadrant pain that was associated with intermittent periods of nausea and vomiting.  She denied diarrhea but reported constipation.  She was found to be in atrial fibrillation with RVR.  She converted to sinus rhythm 08/20/2020 and her Cardizem GTT was discontinued and she was started on Lopressor 25 mg daily. She converted to SR prior to discharge. She was seen in follow up by Coletta Memos on 09/30/20 and was back in rapid afib. Her BB was increased.   Today, patient reports that she has intermittent lightheadedness but otherwise is unaware of her arrhythmia. She is very sedentary at baseline. Her HR has improved from 130s to 110s with increased BB. She denies any missed doses of anticoagulation.   Today, she denies symptoms of palpitations, chest pain, shortness of breath, orthopnea, PND, presyncope, syncope, snoring, daytime somnolence, bleeding, or neurologic sequela. The patient is tolerating medications without difficulties and is otherwise without complaint today.    Atrial Fibrillation Risk Factors:  she does not have symptoms or diagnosis of sleep apnea. she does not have a history of rheumatic fever.   she has a BMI of Body mass index is 18.86 kg/m.[name] Filed Weights   10/14/20 1039  Weight: 54.6 kg    Family History  Problem Relation Age of Onset    Hypertension Mother    Heart attack Father    Diabetes Father    Kidney failure Father    Anxiety disorder Sister    Sarcoidosis Sister      Atrial Fibrillation Management history:  Previous antiarrhythmic drugs: none Previous cardioversions: none Previous ablations: none CHADS2VASC score: 5 Anticoagulation history: Eliquis   Past Medical History:  Diagnosis Date   Bicipital tenosynovitis    Coronary artery disease 05/2007   [id number]    Fall 02/13/2018   Family history of ischemic heart disease    Gout    Hammer toe    Hip, thigh, leg, and ankle, insect bite, nonvenomous, without mention of infection(916.4)    Hyperlipidemia    Hypertension    Other abnormality of red blood cells    Rotator cuff syndrome of left shoulder    Stroke [facility])    Past Surgical History:  Procedure Laterality Date   CARDIAC CATHETERIZATION     COLONOSCOPY WITH PROPOFOL N/A 01/30/2020   Procedure: COLONOSCOPY WITH PROPOFOL;  Surgeon: Carol Ada, MD;  Location: [facility] ENDOSCOPY;  Service: Endoscopy;  Laterality: N/A;   CORONARY ARTERY BYPASS GRAFT     triple   CORONARY ARTERY BYPASS GRAFT  2008   POLYPECTOMY  01/30/2020   Procedure: POLYPECTOMY;  Surgeon: Carol Ada, MD;  Location: [facility] ENDOSCOPY;  Service: Endoscopy;;   TUBAL LIGATION      Current Outpatient Medications  Medication Sig Dispense Refill   amLODipine (NORVASC) 2.5 MG tablet TAKE 3 TABLETS BY MOUTH EVERY DAY 90 tablet 3   apixaban (ELIQUIS) 5 MG TABS tablet Take 1 tablet (5 mg total) by mouth  2 (two) times daily. 180 tablet 3   aspirin (QC LO-DOSE ASPIRIN) 81 MG EC tablet Take 1 tablet (81 mg total) by mouth daily. Swallow whole. 90 tablet 1   atorvastatin (LIPITOR) 80 MG tablet Take 1 tablet (80 mg total) by mouth daily. 90 tablet 1   Blood Pressure Monitoring (BLOOD PRESSURE KIT) DEVI Use as directed to check blood pressure daily dx: I10 1 each 1   FEROSUL 325 (65 Fe) MG tablet TAKE 1 TABLET BY MOUTH  EVERY DAY WITH BREAKFAST 30 tablet 8   meclizine (ANTIVERT) 25 MG tablet Take 1 tablet (25 mg total) by mouth 3 (three) times daily as needed for dizziness. 30 tablet 0   metoprolol tartrate (LOPRESSOR) 50 MG tablet Take 1 tablet (50 mg total) by mouth 2 (two) times daily. 60 tablet 3   Multiple Vitamin (MULTIVITAMIN) capsule Take 1 capsule by mouth daily.     omeprazole (PRILOSEC) 20 MG capsule Take 1 capsule (20 mg total) by mouth 2 (two) times daily before a meal. 180 capsule 1   polyethylene glycol (MIRALAX / GLYCOLAX) 17 g packet Take 17 g by mouth daily. 14 each 0   No current facility-administered medications for this encounter.    Allergies  Allergen Reactions   Clonidine Derivatives Other (See Comments)    Patch causes scars   Spironolactone Rash    Social History   Socioeconomic History   Marital status: Single    Spouse name: Not on file   Number of children: 2   Years of education: Not on file   Highest education level: 11th grade  Occupational History   Occupation: retired  Tobacco Use   Smoking status: Current Some Day Smoker    Packs/day: 2.00    Years: 35.00    Pack years: 70.00    Types: Cigarettes    Last attempt to quit: 08/12/2019    Years since quitting: 1.1   Smokeless tobacco: Former Systems developer    Quit date: 08/09/2008   Tobacco comment: 3-4 cigarettes daily  Vaping Use   Vaping Use: Never used  Substance and Sexual Activity   Alcohol use: Not Currently    Comment: ocassionally wine   Drug use: Not Currently    Types: Marijuana    Comment: history of cocaine use   Sexual activity: Not on file  Other Topics Concern   Not on file  Social History Narrative   Patient is right-handed. She lives alone in a one level home, 2 steps to enter. Her family is very active in her care.    Social Determinants of Health   Financial Resource Strain: Not on file  Food Insecurity: Not on file  Transportation Needs: Not on file  Physical  Activity: Not on file  Stress: Not on file  Social Connections: Not on file  Intimate Partner Violence: Not on file     ROS- All systems are reviewed and negative except as per the HPI above.  Physical Exam: Vitals:   10/14/20 1039  BP: (!) 118/92  Pulse: (!) 112  Weight: 54.6 kg  Height: _0  (1.702 m)    GEN- The patient is a well appearing female, alert and oriented x 3 today.   Head- normocephalic, atraumatic Eyes-  Sclera clear, conjunctiva pink Ears- hearing intact Oropharynx- clear Neck- supple  Lungs- Clear to ausculation bilaterally, normal work of breathing Heart- irregular rate and rhythm, no murmurs, rubs or gallops  GI- soft, NT, ND, + BS Extremities- no clubbing, cyanosis.  Trace bilateral edema MS- no significant deformity or atrophy Skin- no rash or lesion Psych- euthymic mood, full affect Neuro- strength and sensation are intact  Wt Readings from Last 3 Encounters:  10/14/20 54.6 kg  09/30/20 53.5 kg  08/18/20 51.3 kg    EKG today demonstrates  Afib, LVH Vent. rate 112 BPM PR interval * ms QRS duration 92 ms QT/QTc 314/428 ms  Echo 08/19/20 demonstrated  1. Left ventricular ejection fraction, by estimation, is 65 to 70%. The  left ventricle has normal function. The left ventricle has no regional  wall motion abnormalities. There is mild concentric left ventricular  hypertrophy. Left ventricular diastolic  function could not be evaluated.  2. Right ventricular systolic function is normal. The right ventricular  size is normal. There is normal pulmonary artery systolic pressure. The  estimated right ventricular systolic pressure is 57.0 mmHg.  3. Left atrial size was moderately dilated.  4. Right atrial size was mildly dilated.  5. The mitral valve is normal in structure. Mild mitral valve  regurgitation. No evidence of mitral stenosis.  6. Tricuspid valve regurgitation is mild to moderate.  7. The aortic valve is normal in structure.  Aortic valve regurgitation is  not visualized. No aortic stenosis is present.  8. Aortic dilatation noted. There is mild dilatation of the ascending  aorta, measuring 42 mm.  9. The inferior vena cava is normal in size with greater than 50%  respiratory variability, suggesting right atrial pressure of 3 mmHg.   Epic records are reviewed at length today  CHA2DS2-VASc Score = 5  The patient's score is based upon: CHF History: No HTN History: Yes Diabetes History: No Stroke History: Yes Vascular Disease History: Yes Age Score: 0 Gender Score: 1      ASSESSMENT AND PLAN: 1. Persistent Atrial Fibrillation The patient's CHA2DS2-VASc score is 5, indicating a 7.2% annual risk of stroke.   Patient appears to be persistently in afib. We discussed therapeutic options today including DCCV, AAD (Multaq, amiodarone), and rate control. Patient would like to try DCCV alone first. Will arrange DCCV. Patient denies any missed doses of anticoagulaiton in the last 3 weeks. Would avoid class 1C with h/o CAD. Also hesitant to use sotalol or dofetilide with QT 460s-480s in SR. Continue Eliquis 5 mg BID Continue Lopressor 50 mg BID Check bmet/CBC  2. Secondary Hypercoagulable State (ICD10:  D68.69) The patient is at significant risk for stroke/thromboembolism based upon her CHA2DS2-VASc Score of 5.  Continue Apixaban (Eliquis).   3. HTN Stable, no changes today.  4. CAD S/p CABG 2008 No anginal symptoms.   Follow up in the AF clinic post DCCV.    [facility] [address] [place], [place] [address] [phone] 10/14/2020 10:50 AM

## 2020-10-13 NOTE — Patient Instructions (Signed)
   Goals we discussed today:  Goals Addressed            This Visit's Progress   . Work with SW to address barriers to treatment       Timeframe:  Long-Range Goal Priority:  Low Start Date:   1.14.22                          Expected End Date: 5.14.22                       Next planned outreach: 5.26.22  Patient Goals/Self-Care Activities Over the next 90 days, patient will: With the help of her sister  - Patient will self administer medications as prescribed Patient will attend all scheduled provider appointments Patient will call provider office for new concerns or questions Contact SW as needed prior to next scheduled call

## 2020-10-13 NOTE — Chronic Care Management (AMB) (Signed)
Social Work Note  10/13/2020 Name: Kimberly [name] MRN: [id number] DOB: 1956-04-19  Kimberly [name] is a 65 y.o. year old female who is a primary care patient of [name], [place].  The Care Management team was consulted for assistance with chronic disease management and care coordination needs.  Ms. Sporrer was given information about Care Management services today including:  1. Care Management services include personalized support from designated clinical staff supervised by her physician, including individualized plan of care and coordination with other care providers 2. 24/7 contact phone numbers for assistance for urgent and routine care needs. 3. The patient may stop care management services at any time (effective at the end of the month) by phone call to the office staff.  Patient agreed to services and consent obtained.   Engaged with patient sister Kimberly [name] by phone for follow up visit in response to provider referral for social work chronic care management and care coordination services.  Assessment: Review of patient past medical history, allergies, medications, and health status, including review of pertinent consultant reports was performed as part of comprehensive evaluation and provision of care management/care coordination services.   SDOH (Social Determinants of Health) assessments and interventions performed:  No    Advanced Directives Status: Not addressed in this encounter.  Care Plan  Allergies  Allergen Reactions  . Clonidine Derivatives Other (See Comments)    Patch causes scars  . Spironolactone Rash    Outpatient Encounter Medications as of 10/13/2020  Medication Sig  . amLODipine (NORVASC) 2.5 MG tablet TAKE 3 TABLETS BY MOUTH EVERY DAY  . amoxicillin (AMOXIL) 875 MG tablet Take 1 tablet (875 mg total) by mouth 2 (two) times daily.  [name] apixaban (ELIQUIS) 5 MG TABS tablet Take 1 tablet (5 mg total) by mouth 2 (two) times daily.  [name] aspirin (QC LO-DOSE  ASPIRIN) 81 MG EC tablet Take 1 tablet (81 mg total) by mouth daily. Swallow whole.  [name] atorvastatin (LIPITOR) 80 MG tablet Take 1 tablet (80 mg total) by mouth daily.  . Blood Pressure Monitoring (BLOOD PRESSURE KIT) DEVI Use as directed to check blood pressure daily dx: I10  . dicyclomine (BENTYL) 10 MG capsule Take 1 capsule (10 mg total) by mouth 4 (four) times daily -  before meals and at bedtime for 10 days.  . FEROSUL 325 (65 Fe) MG tablet TAKE 1 TABLET BY MOUTH EVERY DAY WITH BREAKFAST  . meclizine (ANTIVERT) 25 MG tablet Take 1 tablet (25 mg total) by mouth 3 (three) times daily as needed for dizziness.  . metoprolol tartrate (LOPRESSOR) 50 MG tablet Take 1 tablet (50 mg total) by mouth 2 (two) times daily.  . Multiple Vitamin (MULTIVITAMIN) capsule Take 1 capsule by mouth daily.  [name] omeprazole (PRILOSEC) 20 MG capsule Take 1 capsule (20 mg total) by mouth 2 (two) times daily before a meal.  . polyethylene glycol (MIRALAX / GLYCOLAX) 17 g packet Take 17 g by mouth daily.   No facility-administered encounter medications on file as of 10/13/2020.    Patient Active Problem List   Diagnosis Date Noted  . Right sided abdominal pain   . Abdominal wall pain   . Atrial fibrillation ([place]) 08/19/2020  . Educated about COVID-19 virus infection 11/16/2019  . Urinary frequency 04/14/2019  . Cough 04/14/2019  . AKI (acute kidney injury) ([place]) 11/26/2018  . Diarrhea 11/25/2018  . Dehydration, mild 11/25/2018  . Hemiparesis affecting nondominant 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 ([place]) 07/31/2018  .  Muscle ache 07/31/2018  . Prediabetes 07/31/2018  . Chronic gout without tophus 07/31/2018  . Vision loss 07/31/2018  . Malnutrition of moderate degree 02/15/2018  . Carotid occlusion, bilateral   . Atrial fibrillation with RVR ([place]) 02/13/2018  . Positive D dimer 02/13/2018  . Back pain 02/13/2018  . CVA (cerebral vascular accident) ([place]) 02/13/2018  . Paroxysmal atrial fibrillation  ([place]) 02/13/2018  . Coronary artery disease involving native coronary artery of native heart without angina pectoris 02/13/2018  . 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 Aortic atherosclerosis ([place]) 02/13/2018  . CAD, ARTERY BYPASS GRAFT 05/03/2009  . TOBACCO USE, QUIT 05/03/2009  . ROTATOR CUFF SYNDROME, LEFT 02/12/2009  . Shoulder pain, left 11/03/2008  . HAMMER TOE, ACQUIRED 02/11/2008  . INSECT BITE, LEG 02/11/2008  . Cerebral artery occlusion with cerebral infarction ([place]) 12/05/2007  . Dyslipidemia 11/05/2007  . UNSPECIFIED DISORDER TEETH&SUPPORTING STRUCTURES 11/05/2007  . BICEPS TENDINITIS, RIGHT 04/24/2007  . Essential hypertension 04/23/2007  . MICROCYTOSIS 04/23/2007    Conditions to be addressed/monitored: HTN and Depression; Limited access to caregiver and Care Coordination  Care Plan : Social Work [facility] Plan of Care  Updates made by [name] since 10/13/2020 12:00 AM    Problem: Barriers to Treatment     Long-Range Goal: Barriers to Treatment Identified and Managed   Start Date: 08/27/2020  Expected End Date: 12/25/2020  Priority: Low  Note:   Current Barriers:  . Limited social support . Level of care concerns . ADL IADL limitations . Limited access to caregiver . Abnormal weight loss . Chronic conditions including HTN and Depression which put patient at increased risk of hospitalization  Social Work Clinical Goal(s):  [name] Over the next 120 days the patient will work with SW to address care coordination needs   Interventions: . 1:1 collaboration with [name], [place] regarding development and update of comprehensive plan of care as evidenced by provider attestation and co-signature . Inter-disciplinary care team collaboration (see longitudinal plan of care) . Successful outbound call placed to the patients sister Kimberly [name] to assist with care coordination needs . Discussed the patient  continues to have decreased appetite - the patients daughter has been in town for the past month assisting and has noticed the patient is not eating as well as she has in the past . Collaboration with [name] FNP and [place] to discuss continued weight loss and possible interventions to assist the patient . Noted the patient is working with Glenard Haring Little RN CM to obtain a new in home scale . Performed chart review to note provider is aware of patients continued weight-loss ; past referral was placed to nutritionist but canceled by the patient due to limited ability to get to appointment . Determined the patient has an appointment scheduled for cardiologist on 3.3.22 - no transportation barriers identified at time of call . Discussed the patient engaged with embedded pharmacy team to determine no cost savings available - continues to stay with [facility] . No SW needs identified at this time - scheduled follow up call over the next 90 days  Patient Goals/Self-Care Activities Over the next 60 days, patient will: With the help of her sister  - Patient will self administer medications as prescribed Patient will attend all scheduled provider appointments Patient will call provider office for new concerns or questions Contact SW as needed prior to next scheduled call  Follow up Plan: SW will follow  up with patient by phone over the next 90 days       Follow Up Plan: SW will follow up with patient by phone over the next 90 days.      [name], BSW, CDP Social Worker, Certified Dementia Practitioner [facility] [phone]

## 2020-10-14 ENCOUNTER — Ambulatory Visit (HOSPITAL_COMMUNITY)
Admission: RE | Admit: 2020-10-14 | Discharge: 2020-10-14 | Disposition: A | Discharge status: Home / Self Care | Payer: Medicaid Other | Source: Ambulatory Visit | Attending: Physician Assistant | Admitting: Physician Assistant

## 2020-10-14 ENCOUNTER — Ambulatory Visit (HOSPITAL_COMMUNITY): Payer: Medicaid Other | Admitting: Physician Assistant

## 2020-10-14 ENCOUNTER — Encounter (HOSPITAL_COMMUNITY): Payer: Self-pay | Admitting: Physician Assistant

## 2020-10-14 ENCOUNTER — Other Ambulatory Visit: Payer: Self-pay

## 2020-10-14 VITALS — BP 118/92 | HR 112 | Ht 67.0 in | Wt 120.4 lb

## 2020-10-14 DIAGNOSIS — F17210 Nicotine dependence, cigarettes, uncomplicated: Secondary | ICD-10-CM | POA: Insufficient documentation

## 2020-10-14 DIAGNOSIS — Z7901 Long term (current) use of anticoagulants: Secondary | ICD-10-CM | POA: Diagnosis not present

## 2020-10-14 DIAGNOSIS — Z7982 Long term (current) use of aspirin: Secondary | ICD-10-CM | POA: Insufficient documentation

## 2020-10-14 DIAGNOSIS — Z951 Presence of aortocoronary bypass graft: Secondary | ICD-10-CM | POA: Insufficient documentation

## 2020-10-14 DIAGNOSIS — I2510 Atherosclerotic heart disease of native coronary artery without angina pectoris: Secondary | ICD-10-CM | POA: Diagnosis not present

## 2020-10-14 DIAGNOSIS — I10 Essential (primary) hypertension: Secondary | ICD-10-CM | POA: Diagnosis not present

## 2020-10-14 DIAGNOSIS — I4821 Permanent atrial fibrillation: Secondary | ICD-10-CM | POA: Insufficient documentation

## 2020-10-14 DIAGNOSIS — Z8673 Personal history of transient ischemic attack (TIA), and cerebral infarction without residual deficits: Secondary | ICD-10-CM | POA: Insufficient documentation

## 2020-10-14 DIAGNOSIS — I4819 Other persistent atrial fibrillation: Secondary | ICD-10-CM

## 2020-10-14 DIAGNOSIS — Z888 Allergy status to other drugs, medicaments and biological substances status: Secondary | ICD-10-CM | POA: Insufficient documentation

## 2020-10-14 DIAGNOSIS — Z8249 Family history of ischemic heart disease and other diseases of the circulatory system: Secondary | ICD-10-CM | POA: Insufficient documentation

## 2020-10-14 DIAGNOSIS — Z79899 Other long term (current) drug therapy: Secondary | ICD-10-CM | POA: Insufficient documentation

## 2020-10-14 DIAGNOSIS — D6869 Other thrombophilia: Secondary | ICD-10-CM | POA: Insufficient documentation

## 2020-10-14 LAB — BASIC METABOLIC PANEL
Anion gap: 8 (ref 5–15)
BUN: 17 mg/dL (ref 8–23)
CO2: 24 mmol/L (ref 22–32)
Calcium: 9.6 mg/dL (ref 8.9–10.3)
Chloride: 112 mmol/L — ABNORMAL HIGH (ref 98–111)
Creatinine, Ser: 0.93 mg/dL (ref 0.44–1.00)
GFR, Estimated: >60 mL/min (ref >60)
Glucose, Bld: 64 mg/dL — ABNORMAL LOW (ref 70–99)
Potassium: 3.9 mmol/L (ref 3.5–5.1)
Sodium: 144 mmol/L (ref 135–145)

## 2020-10-14 LAB — CBC
HCT: 38.1 % (ref 36.0–46.0)
Hemoglobin: 11.7 g/dL — ABNORMAL LOW (ref 12.0–15.0)
MCH: 22.2 pg — ABNORMAL LOW (ref 26.0–34.0)
MCHC: 30.7 g/dL (ref 30.0–36.0)
MCV: 72.2 fL — ABNORMAL LOW (ref 80.0–100.0)
Platelets: 263 10*3/uL (ref 150–400)
RBC: 5.28 MIL/uL — ABNORMAL HIGH (ref 3.87–5.11)
RDW: 18.6 % — ABNORMAL HIGH (ref 11.5–15.5)
WBC: 6.4 10*3/uL (ref 4.0–10.5)
nRBC: 0 % (ref 0.0–0.2)

## 2020-10-14 NOTE — Patient Instructions (Addendum)
Cardioversion scheduled for Thursday, March 10th  - Arrive at the [facility] and go to admitting at 1000AM  - Do not eat or drink anything after midnight the night prior to your procedure.  - Take all your morning medication (except diabetic medications) with a sip of water prior to arrival.  - You will not be able to drive home after your procedure.  - Do NOT miss any doses of your blood thinner - if you should miss a dose please notify our office immediately.  - If you feel as if you go back into normal rhythm prior to scheduled cardioversion, please notify our office immediately. If your procedure is canceled in the cardioversion suite you will be charged a cancellation fee.

## 2020-10-19 ENCOUNTER — Other Ambulatory Visit (HOSPITAL_COMMUNITY)
Admission: RE | Admit: 2020-10-19 | Discharge: 2020-10-19 | Disposition: A | Discharge status: Home / Self Care | Payer: Medicaid Other | Source: Ambulatory Visit | Attending: Cardiology | Admitting: Cardiology

## 2020-10-19 DIAGNOSIS — Z01812 Encounter for preprocedural laboratory examination: Secondary | ICD-10-CM | POA: Insufficient documentation

## 2020-10-19 DIAGNOSIS — Z20822 Contact with and (suspected) exposure to covid-19: Secondary | ICD-10-CM | POA: Insufficient documentation

## 2020-10-19 LAB — SARS CORONAVIRUS 2 (TAT 6-24 HRS): SARS Coronavirus 2: NEGATIVE

## 2020-10-21 ENCOUNTER — Encounter (HOSPITAL_COMMUNITY): Admission: RE | Payer: Self-pay | Source: Home / Self Care

## 2020-10-21 ENCOUNTER — Ambulatory Visit (HOSPITAL_COMMUNITY): Admission: RE | Admit: 2020-10-21 | Payer: Medicaid Other | Source: Home / Self Care | Admitting: Cardiology

## 2020-10-21 SURGERY — CARDIOVERSION
Anesthesia: General

## 2020-10-27 ENCOUNTER — Telehealth (HOSPITAL_COMMUNITY): Payer: Self-pay | Admitting: *Deleted

## 2020-10-27 NOTE — Telephone Encounter (Signed)
Patient's sister called needing to cancel cardioversion scheduled for 3/10. Attempted to reschedule cardioversion patients sister stated that patient has "other medical problems" going on right now that need to be taken care of by her PCP and will call back to reschedule when "she's ready." Will inform referring provider of status of patient at this time.

## 2020-10-29 ENCOUNTER — Ambulatory Visit (HOSPITAL_COMMUNITY): Payer: Medicaid Other | Admitting: Physician Assistant

## 2020-11-08 ENCOUNTER — Other Ambulatory Visit: Payer: Self-pay | Admitting: Nurse Practitioner

## 2020-11-10 ENCOUNTER — Encounter: Payer: Self-pay | Admitting: Nurse Practitioner

## 2020-11-10 ENCOUNTER — Other Ambulatory Visit: Payer: Self-pay

## 2020-11-10 ENCOUNTER — Ambulatory Visit: Payer: Medicaid Other | Admitting: Nurse Practitioner

## 2020-11-10 VITALS — BP 122/68 | HR 59 | Temp 98.1°F

## 2020-11-10 DIAGNOSIS — L602 Onychogryphosis: Secondary | ICD-10-CM | POA: Diagnosis not present

## 2020-11-10 DIAGNOSIS — K029 Dental caries, unspecified: Secondary | ICD-10-CM

## 2020-11-10 DIAGNOSIS — N39498 Other specified urinary incontinence: Secondary | ICD-10-CM

## 2020-11-10 DIAGNOSIS — R350 Frequency of micturition: Secondary | ICD-10-CM | POA: Diagnosis not present

## 2020-11-10 MED ORDER — CLINDAMYCIN HCL 150 MG PO CAPS: 150.0000 mg | ORAL_CAPSULE | Freq: Three times a day (TID) | ORAL | 0 refills | Status: AC

## 2020-11-10 MED ORDER — TRAMADOL HCL 50 MG PO TABS: 50.0000 mg | ORAL_TABLET | Freq: Four times a day (QID) | ORAL | 0 refills | Status: AC | PRN

## 2020-11-10 NOTE — Progress Notes (Addendum)
Rutherford Nail as a scribe for [name], FNP.,have documented all relevant documentation on the behalf of [name], FNP,as directed by  [name], FNP while in the presence of [name], [place]. This visit occurred during the SARS-CoV-2 public health emergency.  Safety protocols were in place, including screening questions prior to the visit, additional usage of staff PPE, and extensive cleaning of exam room while observing appropriate contact time as indicated for disinfecting solutions.  Subjective:     Patient ID: Kimberly [name] , female    DOB: 1955-12-23 , 65 y.o.   MRN: [id number]   Chief Complaint  Patient presents with  . Dental Pain    HPI  Pt here today for tooth pain for 2 weeks has taken orajel and over the counter tylenol and a referral for podiatrist to cut her toenails    Dental Pain  This is a new problem. The current episode started 1 to 4 weeks ago (2 weeks). The problem has been gradually worsening. The pain is at a severity of 8/10. The pain is severe. Pertinent negatives include no difficulty swallowing. She has tried NSAIDs and acetaminophen for the symptoms. The treatment provided no relief.     Past Medical History:  Diagnosis Date  . Bicipital tenosynovitis   . Coronary artery disease 05/2007   [id number]   . Fall 02/13/2018  . Family history of ischemic heart disease   . Gout   . Hammer toe   . Hip, thigh, leg, and ankle, insect bite, nonvenomous, without mention of infection(916.4)   . Hyperlipidemia   . Hypertension   . Other abnormality of red blood cells   . Rotator cuff syndrome of left shoulder   . Stroke [facility])      Family History  Problem Relation Age of Onset  . Hypertension Mother   . Heart attack Father   . Diabetes Father   . Kidney failure Father   . Anxiety disorder Sister   . Sarcoidosis Sister      Current Outpatient Medications:  .  amLODipine (NORVASC) 2.5 MG tablet, TAKE 3 TABLETS BY MOUTH EVERY DAY  (Patient taking differently: Take 7.5 mg by mouth daily.), Disp: 90 tablet, Rfl: 3 .  apixaban (ELIQUIS) 5 MG TABS tablet, Take 1 tablet (5 mg total) by mouth 2 (two) times daily., Disp: 180 tablet, Rfl: 3 .  ASPIRIN LOW DOSE 81 MG EC tablet, TAKE 1 TABLET BY MOUTH EVERY DAY, Disp: 90 tablet, Rfl: 1 .  atorvastatin (LIPITOR) 80 MG tablet, Take 1 tablet (80 mg total) by mouth daily., Disp: 90 tablet, Rfl: 1 .  Blood Pressure Monitoring (BLOOD PRESSURE KIT) DEVI, Use as directed to check blood pressure daily dx: I10, Disp: 1 each, Rfl: 1 .  clindamycin (CLEOCIN) 150 MG capsule, Take 1 capsule (150 mg total) by mouth 3 (three) times daily for 10 days., Disp: 30 capsule, Rfl: 0 .  FEROSUL 325 (65 Fe) MG tablet, TAKE 1 TABLET BY MOUTH EVERY DAY WITH BREAKFAST (Patient taking differently: Take 325 mg by mouth daily.), Disp: 30 tablet, Rfl: 8 .  hydrocortisone cream 1 %, Apply 1 application topically daily as needed for itching., Disp: , Rfl:  .  meclizine (ANTIVERT) 25 MG tablet, Take 1 tablet (25 mg total) by mouth 3 (three) times daily as needed for dizziness., Disp: 30 tablet, Rfl: 0 .  metoprolol tartrate (LOPRESSOR) 50 MG tablet, Take 1 tablet (50 mg total) by mouth 2 (two) times daily., Disp: 60 tablet,  Rfl: 3 .  Multiple Vitamin (MULTIVITAMIN) capsule, Take 1 capsule by mouth daily., Disp: , Rfl:  .  omeprazole (PRILOSEC) 20 MG capsule, Take 1 capsule (20 mg total) by mouth 2 (two) times daily before a meal., Disp: 180 capsule, Rfl: 1 .  polyethylene glycol (MIRALAX / GLYCOLAX) 17 g packet, Take 17 g by mouth daily. (Patient taking differently: Take 17 g by mouth daily as needed for moderate constipation.), Disp: 14 each, Rfl: 0 .  traMADol (ULTRAM) 50 MG tablet, Take 1 tablet (50 mg total) by mouth every 6 (six) hours as needed., Disp: 20 tablet, Rfl: 0   Allergies  Allergen Reactions  . Clonidine Derivatives Other (See Comments)    Patch causes scars  . Spironolactone Rash     Review of  Systems  Constitutional: Negative.  Negative for fatigue.  HENT:       Tooth pain  Endocrine: Negative for polydipsia, polyphagia and polyuria.  Musculoskeletal: Negative.   Skin: Negative.   Neurological: Negative for dizziness and headaches.  Psychiatric/Behavioral: Negative.      Today's Vitals   11/10/20 1023  BP: 122/68  Pulse: (!) 59  Temp: 98.1 F (36.7 C)  TempSrc: Oral   There is no height or weight on file to calculate BMI.   Objective:  Physical Exam Vitals reviewed.  Constitutional:      General: She is not in acute distress.    Appearance: Normal appearance. She is obese.  HENT:     Head: Normocephalic.     Mouth/Throat:     Dentition: Dental caries (left upper molars - she has swelling noted to her left side of her face) present.  Eyes:     Extraocular Movements: Extraocular movements intact.     Conjunctiva/sclera: Conjunctivae normal.     Pupils: Pupils are equal, round, and reactive to light.  Cardiovascular:     Rate and Rhythm: Normal rate and regular rhythm.     Pulses: Normal pulses.     Heart sounds: Normal heart sounds. No murmur heard.   Pulmonary:     Effort: Pulmonary effort is normal. No respiratory distress.     Breath sounds: Normal breath sounds. No wheezing.  Skin:    General: Skin is warm and dry.     Capillary Refill: Capillary refill takes less than 2 seconds.  Neurological:     General: No focal deficit present.     Mental Status: She is alert and oriented to person, place, and time.     Cranial Nerves: No cranial nerve deficit.     Motor: No weakness.  Psychiatric:        Mood and Affect: Mood normal.        Behavior: Behavior normal.        Thought Content: Thought content normal.        Judgment: Judgment normal.         Assessment And Plan:     1. Pain due to dental caries  She has multiple cavities noted to upper molars on left side of mouth, she has swelling noted as well. I am concerned she may have an abscess  will treat with clindamycin  I have strongly recommended she find a dentist.  I will also refer her to a dentist Dr. Johnnette Litter - clindamycin (CLEOCIN) 150 MG capsule; Take 1 capsule (150 mg total) by mouth 3 (three) times daily for 10 days.  Dispense: 30 capsule; Refill: 0 - traMADol (ULTRAM) 50 MG tablet; Take 1  tablet (50 mg total) by mouth every 6 (six) hours as needed.  Dispense: 20 tablet; Refill: 0  2. Urinary frequency  I will have her to provide a urinalysis   3. Thickened nails  Referral to podiatry due to thickened and long toenails - Ambulatory referral to Podiatry  4. Other urinary incontinence  She requires urinary incontinence supplies due to having intermittent incontinence   Patient was given opportunity to ask questions. Patient verbalized understanding of the plan and was able to repeat key elements of the plan. All questions were answered to their satisfaction.  [name], FNP   I, [name], FNP, have reviewed all documentation for this visit. The documentation on 11/15/20 for the exam, diagnosis, procedures, and orders are all accurate and complete.   IF YOU HAVE BEEN REFERRED TO A SPECIALIST, IT MAY TAKE 1-2 WEEKS TO SCHEDULE/PROCESS THE REFERRAL. IF YOU HAVE NOT HEARD FROM US/SPECIALIST IN TWO WEEKS, PLEASE GIVE [place] A CALL AT [phone].   THE PATIENT IS ENCOURAGED TO PRACTICE SOCIAL DISTANCING DUE TO THE COVID-19 PANDEMIC.

## 2020-11-16 ENCOUNTER — Telehealth: Payer: Medicaid Other

## 2020-11-18 ENCOUNTER — Other Ambulatory Visit: Payer: Self-pay

## 2020-11-18 ENCOUNTER — Other Ambulatory Visit: Payer: Self-pay | Admitting: Nurse Practitioner

## 2020-11-18 DIAGNOSIS — N3000 Acute cystitis without hematuria: Secondary | ICD-10-CM

## 2020-11-18 LAB — POCT URINALYSIS DIPSTICK
Bilirubin, UA: NEGATIVE
Blood, UA: NEGATIVE
Glucose, UA: NEGATIVE
Ketones, UA: NEGATIVE
Leukocytes, UA: NEGATIVE
Nitrite, UA: POSITIVE
Protein, UA: POSITIVE — AB
Spec Grav, UA: 1.025 (ref 1.010–1.025)
Urobilinogen, UA: 0.2 E.U./dL
pH, UA: 5.5 (ref 5.0–8.0)

## 2020-11-18 MED ORDER — NITROFURANTOIN MONOHYD MACRO 100 MG PO CAPS: 100.0000 mg | ORAL_CAPSULE | Freq: Two times a day (BID) | ORAL | 0 refills | Status: AC

## 2020-11-18 NOTE — Addendum Note (Signed)
Addended by: Candiss Norse T on: 11/18/2020 05:19 PM   Modules accepted: Orders

## 2020-11-24 ENCOUNTER — Other Ambulatory Visit: Payer: Self-pay | Admitting: Nurse Practitioner

## 2020-11-24 ENCOUNTER — Ambulatory Visit: Payer: Medicaid Other

## 2020-11-24 DIAGNOSIS — R634 Abnormal weight loss: Secondary | ICD-10-CM

## 2020-11-24 DIAGNOSIS — I10 Essential (primary) hypertension: Secondary | ICD-10-CM

## 2020-11-24 DIAGNOSIS — F32A Depression, unspecified: Secondary | ICD-10-CM

## 2020-11-24 DIAGNOSIS — R630 Anorexia: Secondary | ICD-10-CM

## 2020-11-24 LAB — URINE CULTURE

## 2020-11-24 MED ORDER — MIRTAZAPINE 15 MG PO TABS: 15.0000 mg | ORAL_TABLET | Freq: Every day | ORAL | 2 refills | Status: DC

## 2020-11-24 NOTE — Chronic Care Management (AMB) (Signed)
Social Work Note  11/24/2020 Name: Kimberly [name] MRN: [id number] DOB: [birth year]  Kimberly [name] is a 65 y.o. year old female who is a primary care patient of [name], [place].  The Care Management team was consulted for assistance with chronic disease management and care coordination needs.  Kimberly [name] was given information about Care Management services today including:  1. Care Management services include personalized support from designated clinical staff supervised by her physician, including individualized plan of care and coordination with other care providers 2. 24/7 contact phone numbers for assistance for urgent and routine care needs. 3. The patient may stop care management services at any time (effective at the end of the month) by phone call to the office staff.  Patient agreed to services and consent obtained.   Engaged with patient sister Kimberly [name] by phone for follow up visit in response to provider referral for social work chronic care management and care coordination services.  Assessment: Review of patient past medical history, allergies, medications, and health status, including review of pertinent consultant reports was performed as part of comprehensive evaluation and provision of care management/care coordination services.   SDOH (Social Determinants of Health) assessments and interventions performed:  No    Advanced Directives Status: Not addressed in this encounter.  Care Plan  Allergies  Allergen Reactions  . Clonidine Derivatives Other (See Comments)    Patch causes scars  . Spironolactone Rash    Outpatient Encounter Medications as of 11/24/2020  Medication Sig  . amLODipine (NORVASC) 2.5 MG tablet TAKE 3 TABLETS BY MOUTH EVERY DAY (Patient taking differently: Take 7.5 mg by mouth daily.)  . apixaban (ELIQUIS) 5 MG TABS tablet Take 1 tablet (5 mg total) by mouth 2 (two) times daily.  . ASPIRIN LOW DOSE 81 MG EC tablet TAKE 1 TABLET BY MOUTH EVERY  DAY  . atorvastatin (LIPITOR) 80 MG tablet Take 1 tablet (80 mg total) by mouth daily.  . Blood Pressure Monitoring (BLOOD PRESSURE KIT) DEVI Use as directed to check blood pressure daily dx: I10  . FEROSUL 325 (65 Fe) MG tablet TAKE 1 TABLET BY MOUTH EVERY DAY WITH BREAKFAST (Patient taking differently: Take 325 mg by mouth daily.)  . hydrocortisone cream 1 % Apply 1 application topically daily as needed for itching.  . meclizine (ANTIVERT) 25 MG tablet Take 1 tablet (25 mg total) by mouth 3 (three) times daily as needed for dizziness.  . metoprolol tartrate (LOPRESSOR) 50 MG tablet Take 1 tablet (50 mg total) by mouth 2 (two) times daily.  . Multiple Vitamin (MULTIVITAMIN) capsule Take 1 capsule by mouth daily.  [name] omeprazole (PRILOSEC) 20 MG capsule Take 1 capsule (20 mg total) by mouth 2 (two) times daily before a meal.  . polyethylene glycol (MIRALAX / GLYCOLAX) 17 g packet Take 17 g by mouth daily. (Patient taking differently: Take 17 g by mouth daily as needed for moderate constipation.)  . traMADol (ULTRAM) 50 MG tablet Take 1 tablet (50 mg total) by mouth every 6 (six) hours as needed.   No facility-administered encounter medications on file as of 11/24/2020.    Patient Active Problem List   Diagnosis Date Noted  . Secondary hypercoagulable state ([place]) 10/14/2020  . Right sided abdominal pain   . Abdominal wall pain   . Atrial fibrillation ([place]) 08/19/2020  . Educated about COVID-19 virus infection 11/16/2019  . Urinary frequency 04/14/2019  . Cough 04/14/2019  . AKI (acute kidney injury) ([place]) 11/26/2018  . Diarrhea  11/25/2018  . Dehydration, mild 11/25/2018  . Hemiparesis affecting nondominant 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 ([place]) 07/31/2018  . Muscle ache 07/31/2018  . Prediabetes 07/31/2018  . Chronic gout without tophus 07/31/2018  . Vision loss 07/31/2018  . Malnutrition of moderate degree 02/15/2018  . Carotid occlusion, bilateral   . Atrial  fibrillation with RVR ([place]) 02/13/2018  . Positive D dimer 02/13/2018  . Back pain 02/13/2018  . CVA (cerebral vascular accident) ([place]) 02/13/2018  . Paroxysmal atrial fibrillation ([place]) 02/13/2018  . Coronary artery disease involving native coronary artery of native heart without angina pectoris 02/13/2018  . 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 Aortic atherosclerosis ([place]) 02/13/2018  . CAD, ARTERY BYPASS GRAFT 05/03/2009  . TOBACCO USE, QUIT 05/03/2009  . ROTATOR CUFF SYNDROME, LEFT 02/12/2009  . Shoulder pain, left 11/03/2008  . HAMMER TOE, ACQUIRED 02/11/2008  . INSECT BITE, LEG 02/11/2008  . Cerebral artery occlusion with cerebral infarction ([place]) 12/05/2007  . Dyslipidemia 11/05/2007  . UNSPECIFIED DISORDER TEETH&SUPPORTING STRUCTURES 11/05/2007  . BICEPS TENDINITIS, RIGHT 04/24/2007  . Essential hypertension 04/23/2007  . MICROCYTOSIS 04/23/2007    Conditions to be addressed/monitored: HTN and Depression; Transportation and Resource needs  Care Plan : Social Work [facility] Plan of Care  Updates made by [name] since 11/24/2020 12:00 AM    Problem: Barriers to Treatment     Long-Range Goal: Barriers to Treatment Identified and Managed   Start Date: 08/27/2020  Expected End Date: 12/25/2020  This Visit's Progress: On track  Priority: Low  Note:   Current Barriers:  . Limited social support . Level of care concerns . ADL IADL limitations . Limited access to caregiver . Abnormal weight loss . Chronic conditions including HTN and Depression which put patient at increased risk of hospitalization  Social Work Clinical Goal(s):  [name] Over the next 120 days the patient will work with SW to address care coordination needs   Interventions: . 1:1 collaboration with [name], [place] regarding development and update of comprehensive plan of care as evidenced by provider attestation and  co-signature . Inter-disciplinary care team collaboration (see longitudinal plan of care) . Collaboration with patients primary provider who indicates patient is having difficulty identifying a dentist who is in network with her health plan . Successful outbound call placed to the patients sister, Kimberly [name], to assist with identifying an in-network dental provider . Determined the patient has secured a dentist appointment for 4.25.22 at 2:00 pm - no transportation barriers identified . Scheduled follow up call over the next 60 days  Patient Goals/Self-Care Activities Over the next 60 days, patient will: With the help of her sister  - Patient will self administer medications as prescribed Patient will attend all scheduled provider appointments Patient will call provider office for new concerns or questions Contact SW as needed prior to next scheduled call Attend dental appointment on 4.25.22  Follow up Plan: SW will follow up with patient by phone over the next 60 days       Follow Up Plan: SW will follow up with patient by phone over the next 60 days      [name], BSW, CDP Social Worker, Certified Dementia Practitioner [place] [facility] [phone]  Total time spent performing care coordination and/or care management activities with the patient by phone or face to face = 19 minutes.

## 2020-11-24 NOTE — Patient Instructions (Signed)
Visit Information  Goals Addressed            This Visit's Progress   . Work with SW to address barriers to treatment       Timeframe:  Long-Range Goal Priority:  Low Start Date:   1.14.22                          Expected End Date: 5.14.22                       Next planned outreach: 5.26.22  Patient Goals/Self-Care Activities Over the next 90 days, patient will: With the help of her sister  - Patient will self administer medications as prescribed Patient will attend all scheduled provider appointments Patient will call provider office for new concerns or questions Contact SW as needed prior to next scheduled call Attend dental appointment on 4.25.22       The patient verbalized understanding of instructions, educational materials, and care plan provided today and declined offer to receive copy of patient instructions, educational materials, and care plan.   SW will follow up over the next 60 days  [name], BSW, CDP Social Worker, Certified Dementia Practitioner [place] [facility] [phone]

## 2020-11-24 NOTE — Progress Notes (Signed)
I sent remeron to take at bedtime do not take with tramadol.  She can come and pick up another cup so we can retest her urine after she has completed her medications. Make sure she has a follow up in 6 weeks

## 2020-12-07 ENCOUNTER — Encounter: Payer: Self-pay | Admitting: Podiatry

## 2020-12-07 ENCOUNTER — Ambulatory Visit: Payer: Medicaid Other | Admitting: Podiatry

## 2020-12-07 ENCOUNTER — Other Ambulatory Visit: Payer: Self-pay

## 2020-12-07 DIAGNOSIS — M79674 Pain in right toe(s): Secondary | ICD-10-CM

## 2020-12-07 DIAGNOSIS — Z860100 Personal history of colon polyps, unspecified: Secondary | ICD-10-CM | POA: Insufficient documentation

## 2020-12-07 DIAGNOSIS — Z800 Family history of malignant neoplasm of digestive organs: Secondary | ICD-10-CM | POA: Insufficient documentation

## 2020-12-07 DIAGNOSIS — B351 Tinea unguium: Secondary | ICD-10-CM | POA: Diagnosis not present

## 2020-12-07 DIAGNOSIS — M79675 Pain in left toe(s): Secondary | ICD-10-CM

## 2020-12-07 NOTE — Progress Notes (Signed)
  Subjective:  Patient ID: Kimberly [name], female    DOB: 29-Aug-1955,  MRN: [id number]  Chief Complaint  Patient presents with  . Nail Problem    Thick painful toenails    65 y.o. female presents with the above complaint. History confirmed with patient.    Objective:  Physical Exam: warm, good capillary refill, no trophic changes or ulcerative lesions, normal DP and PT pulses, normal monofilament exam and normal sensory exam.  Onychomycosis x10 with thickened elongated brown discolored nails with subungual debris.  She has hammertoe deformities and pes planus. Assessment:   1. Onychomycosis   2. Pain due to onychomycosis of toenails of both feet      Plan:  Patient was evaluated and treated and all questions answered.  Patient educated on diabetes. Discussed proper diabetic foot care and discussed risks and complications of disease. Educated patient in depth on reasons to return to the office immediately should he/she discover anything concerning or new on the feet. All questions answered. Discussed proper shoes as well.   Discussed the etiology and treatment options for the condition in detail with the patient. Educated patient on the topical and oral treatment options for mycotic nails. Recommended debridement of the nails today. Sharp and mechanical debridement performed of all painful and mycotic nails today. Nails debrided in length and thickness using a nail nipper to level of comfort. Discussed treatment options including appropriate shoe gear. Follow up as needed for painful nails.    Return in about 3 months (around 03/08/2021) for routine nail care.

## 2020-12-08 ENCOUNTER — Other Ambulatory Visit: Payer: Self-pay

## 2020-12-17 ENCOUNTER — Telehealth: Payer: Self-pay

## 2020-12-17 NOTE — Chronic Care Management (AMB) (Addendum)
Chronic Care Management Pharmacy Assistant   Name: Kimberly [name]  MRN: [id number] DOB: 1955/11/30   Reason for Encounter: Disease State/ hyperlipidemia, hypertention   Recent office visits:  Every 6 hours ad  10-05-2020 Little,Angel L., RN (CCM)  10-13-2020 [name] (CCM)  11-10-2020 [name], [place]. START Tramadol 39m every 6 hours as needed. START Clindamycin 1571m1 capsule 3 times daily for 10 days.  11-24-2020 HuDaneen SchickCCM)   Recent consult visits:  09-30-2020 ClDeberah PeltonNP (Cardiology)  10-14-2020 FeOliver BarrePAUtahCardiology)  12-07-2020 McCriselda PeachesDPM ([facility]:  Medication Reconciliation was completed by comparing discharge summary, patient's EMR and Pharmacy list, and upon discussion with patient.  Admitted to the hospital on 12-18-2020 due to a Fall and [facility] noted  Medication Changes at Hospital Discharge: None  Medications Discontinued at Hospital Discharge: None  Medications that remain the same after Hospital Discharge:??  All other medications will remain the same.    Medications: Outpatient Encounter Medications as of 12/17/2020  Medication Sig   amLODipine (NORVASC) 2.5 MG tablet TAKE 3 TABLETS BY MOUTH EVERY DAY (Patient taking differently: Take 7.5 mg by mouth daily.)   apixaban (ELIQUIS) 5 MG TABS tablet Take 1 tablet (5 mg total) by mouth 2 (two) times daily.   ASPIRIN LOW DOSE 81 MG EC tablet TAKE 1 TABLET BY MOUTH EVERY DAY   atorvastatin (LIPITOR) 80 MG tablet Take 1 tablet (80 mg total) by mouth daily.   Blood Pressure Monitoring (BLOOD PRESSURE KIT) DEVI Use as directed to check blood pressure daily dx: I10   FEROSUL 325 (65 Fe) MG tablet TAKE 1 TABLET BY MOUTH EVERY DAY WITH BREAKFAST (Patient taking differently: Take 325 mg by mouth daily.)   hydrocortisone cream 1 %  Apply 1 application topically daily as needed for itching.   meclizine (ANTIVERT) 25 MG tablet Take 1 tablet (25 mg total) by mouth 3 (three) times daily as needed for dizziness.   metoprolol tartrate (LOPRESSOR) 50 MG tablet Take 1 tablet (50 mg total) by mouth 2 (two) times daily.   mirtazapine (REMERON) 15 MG tablet Take 1 tablet (15 mg total) by mouth at bedtime.   Multiple Vitamin (MULTIVITAMIN) capsule Take 1 capsule by mouth daily.   omeprazole (PRILOSEC) 20 MG capsule Take 1 capsule (20 mg total) by mouth 2 (two) times daily before a meal.   polyethylene glycol (MIRALAX / GLYCOLAX) 17 g packet Take 17 g by mouth daily. (Patient taking differently: Take 17 g by mouth daily as needed for moderate constipation.)   traMADol (ULTRAM) 50 MG tablet Take 1 tablet (50 mg total) by mouth every 6 (six) hours as needed.   No facility-administered encounter medications on file as of 12/17/2020.   Reviewed chart prior to disease state call. Spoke with patient regarding BP  Recent Office Vitals: BP Readings from Last 3 Encounters:  11/10/20 122/68  10/14/20 (!) 118/92  09/30/20 118/72   Pulse Readings from Last 3 Encounters:  11/10/20 (!) 59  10/14/20 (!) 112  09/30/20 98    Wt Readings from Last 3 Encounters:  10/14/20 120 lb 6.4 oz (54.6 kg)  09/30/20 118 lb (53.5 kg)  08/18/20 113 lb (51.3 kg)     Kidney Function Lab Results  Component Value Date/Time   CREATININE 0.93 10/14/2020 11:16 AM   CREATININE 0.97 08/21/2020 05:54 AM  CREATININE 0.98 05/04/2014 10:47 AM   GFRNONAA >60 10/14/2020 11:16 AM   GFRAA >60 04/27/2020 02:27 PM    BMP Latest Ref Rng & Units 10/14/2020 08/21/2020 08/20/2020  Glucose 70 - 99 mg/dL 64(L) 112(H) 79  BUN 8 - 23 mg/dL 17 17 20   Creatinine 0.44 - 1.00 mg/dL 0.93 0.97 1.06(H)  BUN/Creat Ratio 12 - 28 - - -  Sodium 135 - 145 mmol/L 144 137 138  Potassium 3.5 - 5.1 mmol/L 3.9 4.2 4.4  Chloride 98 - 111 mmol/L 112(H) 105 107  CO2 22 - 32 mmol/L 24 21(L) 23   Calcium 8.9 - 10.3 mg/dL 9.6 9.0 8.9    Current antihypertensive regimen:  Amlodipine 7.80m daily  How often are you checking your Blood Pressure? Patient's sister states patient checks BP infrequently due to not having a device at home.  Current home BP readings: Patient's sister states none.  What recent interventions/DTPs have been made by any provider to improve Blood Pressure control since last CPP Visit: Patient's sister states taking medications as directed.  Any recent hospitalizations or ED visits since last visit with CPP? Yes   What diet changes have been made to improve Blood Pressure Control?  Patient's sister states patient has limited her salt intake.  What exercise is being done to improve your Blood Pressure Control?  Patient's sister states patient does range of motion exercises.  Adherence Review: Is the patient currently on ACE/ARB medication? No Does the patient have >5 day gap between last estimated fill dates? No   Comprehensive medication review performed; Spoke to patient regarding cholesterol  Lipid Panel    Component Value Date/Time   CHOL 114 09/02/2019 1651   TRIG 58 09/02/2019 1651   HDL 47 09/02/2019 1651   LDLCALC 54 09/02/2019 1651    10-year ASCVD risk score: The ASCVD Risk score (Mikey BussingDC Jr., et al., 2013) failed to calculate for the following reasons:   The patient has a prior MI or stroke diagnosis  Current antihyperlipidemic regimen:  Atorvastatin 861mdaily  Previous antihyperlipidemic medications tried:  ASCVD risk enhancing conditions: HTN   What recent interventions/DTPs have been made by any provider to improve Cholesterol control since last CPP Visit: Patient's sister states patient is taking medications as directed.  Any recent hospitalizations or ED visits since last visit with CPP? Yes   What diet changes have been made to improve Cholesterol?  Patient's sister states she is working with improving diet.  What exercise  is being done to improve Cholesterol?  Patient sister states patient does rang of motion exercises.  Adherence Review: Does the patient have >5 day gap between last estimated fill dates? No  12-17-2020: 1st attempt LVM  12-20-2020: 2nd attempt LVM  Star Rating Drugs: Atorvastatin 8032mLast filled 11-22-2020 30DS [phone] have reviewed the care management and care coordination activities outlined in this encounter and I am certifying that I agree with the content of this note. No further action required.  ValMayford KnifePHPrisma [facility]/13/22 12:50 PM

## 2020-12-18 ENCOUNTER — Other Ambulatory Visit: Payer: Self-pay

## 2020-12-18 ENCOUNTER — Emergency Department (HOSPITAL_COMMUNITY): Payer: Medicaid Other

## 2020-12-18 ENCOUNTER — Inpatient Hospital Stay (HOSPITAL_COMMUNITY)
Admission: EM | Admit: 2020-12-18 | Discharge: 2020-12-25 | DRG: 871 | Disposition: A | Discharge status: Home Health | Payer: Medicaid Other | Attending: Internal Medicine | Admitting: Internal Medicine

## 2020-12-18 ENCOUNTER — Encounter (HOSPITAL_COMMUNITY): Payer: Self-pay

## 2020-12-18 DIAGNOSIS — F17210 Nicotine dependence, cigarettes, uncomplicated: Secondary | ICD-10-CM | POA: Diagnosis present

## 2020-12-18 DIAGNOSIS — E785 Hyperlipidemia, unspecified: Secondary | ICD-10-CM | POA: Diagnosis present

## 2020-12-18 DIAGNOSIS — R6510 Systemic inflammatory response syndrome (SIRS) of non-infectious origin without acute organ dysfunction: Secondary | ICD-10-CM

## 2020-12-18 DIAGNOSIS — Z841 Family history of disorders of kidney and ureter: Secondary | ICD-10-CM

## 2020-12-18 DIAGNOSIS — R55 Syncope and collapse: Secondary | ICD-10-CM | POA: Diagnosis present

## 2020-12-18 DIAGNOSIS — F32A Depression, unspecified: Secondary | ICD-10-CM | POA: Diagnosis present

## 2020-12-18 DIAGNOSIS — F419 Anxiety disorder, unspecified: Secondary | ICD-10-CM | POA: Diagnosis present

## 2020-12-18 DIAGNOSIS — I480 Paroxysmal atrial fibrillation: Secondary | ICD-10-CM | POA: Diagnosis present

## 2020-12-18 DIAGNOSIS — K219 Gastro-esophageal reflux disease without esophagitis: Secondary | ICD-10-CM | POA: Diagnosis present

## 2020-12-18 DIAGNOSIS — I2510 Atherosclerotic heart disease of native coronary artery without angina pectoris: Secondary | ICD-10-CM | POA: Diagnosis present

## 2020-12-18 DIAGNOSIS — D551 Anemia due to other disorders of glutathione metabolism: Secondary | ICD-10-CM | POA: Diagnosis not present

## 2020-12-18 DIAGNOSIS — I69354 Hemiplegia and hemiparesis following cerebral infarction affecting left non-dominant side: Secondary | ICD-10-CM

## 2020-12-18 DIAGNOSIS — M25561 Pain in right knee: Secondary | ICD-10-CM | POA: Diagnosis present

## 2020-12-18 DIAGNOSIS — W19XXXA Unspecified fall, initial encounter: Secondary | ICD-10-CM

## 2020-12-18 DIAGNOSIS — K029 Dental caries, unspecified: Secondary | ICD-10-CM | POA: Diagnosis present

## 2020-12-18 DIAGNOSIS — K5900 Constipation, unspecified: Secondary | ICD-10-CM | POA: Diagnosis present

## 2020-12-18 DIAGNOSIS — R0902 Hypoxemia: Secondary | ICD-10-CM | POA: Diagnosis not present

## 2020-12-18 DIAGNOSIS — Z951 Presence of aortocoronary bypass graft: Secondary | ICD-10-CM | POA: Diagnosis not present

## 2020-12-18 DIAGNOSIS — M25562 Pain in left knee: Secondary | ICD-10-CM | POA: Diagnosis present

## 2020-12-18 DIAGNOSIS — R109 Unspecified abdominal pain: Secondary | ICD-10-CM | POA: Diagnosis present

## 2020-12-18 DIAGNOSIS — M79672 Pain in left foot: Secondary | ICD-10-CM

## 2020-12-18 DIAGNOSIS — I340 Nonrheumatic mitral (valve) insufficiency: Secondary | ICD-10-CM | POA: Diagnosis not present

## 2020-12-18 DIAGNOSIS — I712 Thoracic aortic aneurysm, without rupture: Secondary | ICD-10-CM | POA: Diagnosis present

## 2020-12-18 DIAGNOSIS — S8991XA Unspecified injury of right lower leg, initial encounter: Secondary | ICD-10-CM

## 2020-12-18 DIAGNOSIS — R6520 Severe sepsis without septic shock: Secondary | ICD-10-CM

## 2020-12-18 DIAGNOSIS — K0889 Other specified disorders of teeth and supporting structures: Secondary | ICD-10-CM

## 2020-12-18 DIAGNOSIS — R748 Abnormal levels of other serum enzymes: Secondary | ICD-10-CM | POA: Diagnosis present

## 2020-12-18 DIAGNOSIS — I2721 Secondary pulmonary arterial hypertension: Secondary | ICD-10-CM | POA: Diagnosis present

## 2020-12-18 DIAGNOSIS — D509 Iron deficiency anemia, unspecified: Secondary | ICD-10-CM | POA: Diagnosis present

## 2020-12-18 DIAGNOSIS — J9811 Atelectasis: Secondary | ICD-10-CM | POA: Diagnosis present

## 2020-12-18 DIAGNOSIS — E872 Acidosis: Secondary | ICD-10-CM | POA: Diagnosis present

## 2020-12-18 DIAGNOSIS — M109 Gout, unspecified: Secondary | ICD-10-CM | POA: Diagnosis present

## 2020-12-18 DIAGNOSIS — I361 Nonrheumatic tricuspid (valve) insufficiency: Secondary | ICD-10-CM | POA: Diagnosis not present

## 2020-12-18 DIAGNOSIS — Z7901 Long term (current) use of anticoagulants: Secondary | ICD-10-CM

## 2020-12-18 DIAGNOSIS — J9601 Acute respiratory failure with hypoxia: Secondary | ICD-10-CM | POA: Diagnosis present

## 2020-12-18 DIAGNOSIS — A419 Sepsis, unspecified organism: Secondary | ICD-10-CM | POA: Diagnosis present

## 2020-12-18 DIAGNOSIS — M79675 Pain in left toe(s): Secondary | ICD-10-CM | POA: Diagnosis not present

## 2020-12-18 DIAGNOSIS — E860 Dehydration: Secondary | ICD-10-CM | POA: Diagnosis present

## 2020-12-18 DIAGNOSIS — I272 Pulmonary hypertension, unspecified: Secondary | ICD-10-CM | POA: Diagnosis not present

## 2020-12-18 DIAGNOSIS — Z20822 Contact with and (suspected) exposure to covid-19: Secondary | ICD-10-CM | POA: Diagnosis present

## 2020-12-18 DIAGNOSIS — R0602 Shortness of breath: Secondary | ICD-10-CM

## 2020-12-18 DIAGNOSIS — R911 Solitary pulmonary nodule: Secondary | ICD-10-CM | POA: Diagnosis not present

## 2020-12-18 DIAGNOSIS — Z833 Family history of diabetes mellitus: Secondary | ICD-10-CM

## 2020-12-18 DIAGNOSIS — Z8249 Family history of ischemic heart disease and other diseases of the circulatory system: Secondary | ICD-10-CM

## 2020-12-18 LAB — COMPREHENSIVE METABOLIC PANEL
ALT: 57 U/L — ABNORMAL HIGH (ref 0–44)
AST: 48 U/L — ABNORMAL HIGH (ref 15–41)
Albumin: 3.9 g/dL (ref 3.5–5.0)
Alkaline Phosphatase: 116 U/L (ref 38–126)
Anion gap: 10 (ref 5–15)
BUN: 21 mg/dL (ref 8–23)
CO2: 21 mmol/L — ABNORMAL LOW (ref 22–32)
Calcium: 9.6 mg/dL (ref 8.9–10.3)
Chloride: 111 mmol/L (ref 98–111)
Creatinine, Ser: 0.92 mg/dL (ref 0.44–1.00)
GFR, Estimated: >60 mL/min (ref >60)
Glucose, Bld: 107 mg/dL — ABNORMAL HIGH (ref 70–99)
Potassium: 3.6 mmol/L (ref 3.5–5.1)
Sodium: 142 mmol/L (ref 135–145)
Total Bilirubin: 0.7 mg/dL (ref 0.3–1.2)
Total Protein: 6.9 g/dL (ref 6.5–8.1)

## 2020-12-18 LAB — CK TOTAL AND CKMB (NOT AT ARMC)
CK, MB: 7.9 ng/mL — ABNORMAL HIGH (ref 0.5–5.0)
Relative Index: 1.8 (ref 0.0–2.5)
Total CK: 433 U/L — ABNORMAL HIGH (ref 38–234)

## 2020-12-18 LAB — CBC WITH DIFFERENTIAL/PLATELET
Abs Immature Granulocytes: 0.06 10*3/uL (ref 0.00–0.07)
Basophils Absolute: 0 10*3/uL (ref 0.0–0.1)
Basophils Relative: 0 %
Eosinophils Absolute: 0 10*3/uL (ref 0.0–0.5)
Eosinophils Relative: 0 %
HCT: 36.8 % (ref 36.0–46.0)
Hemoglobin: 11.4 g/dL — ABNORMAL LOW (ref 12.0–15.0)
Immature Granulocytes: 0 %
Lymphocytes Relative: 13 %
Lymphs Abs: 1.8 10*3/uL (ref 0.7–4.0)
MCH: 22 pg — ABNORMAL LOW (ref 26.0–34.0)
MCHC: 31 g/dL (ref 30.0–36.0)
MCV: 71 fL — ABNORMAL LOW (ref 80.0–100.0)
Monocytes Absolute: 0.7 10*3/uL (ref 0.1–1.0)
Monocytes Relative: 5 %
Neutro Abs: 11.1 10*3/uL — ABNORMAL HIGH (ref 1.7–7.7)
Neutrophils Relative %: 82 %
Platelets: 229 10*3/uL (ref 150–400)
RBC: 5.18 MIL/uL — ABNORMAL HIGH (ref 3.87–5.11)
RDW: 16.6 % — ABNORMAL HIGH (ref 11.5–15.5)
WBC: 13.7 10*3/uL — ABNORMAL HIGH (ref 4.0–10.5)
nRBC: 0 % (ref 0.0–0.2)

## 2020-12-18 LAB — RESP PANEL BY RT-PCR (FLU A&B, COVID) ARPGX2
Influenza A by PCR: NEGATIVE
Influenza B by PCR: NEGATIVE
SARS Coronavirus 2 by RT PCR: NEGATIVE

## 2020-12-18 LAB — SAMPLE TO BLOOD BANK

## 2020-12-18 LAB — LACTIC ACID, PLASMA
Lactic Acid, Venous: 4.5 mmol/L (ref 0.5–1.9)
Lactic Acid, Venous: 3.4 mmol/L (ref 0.5–1.9)

## 2020-12-18 LAB — TROPONIN I (HIGH SENSITIVITY): Troponin I (High Sensitivity): 69 ng/L — ABNORMAL HIGH (ref <18)

## 2020-12-18 LAB — URINALYSIS, ROUTINE W REFLEX MICROSCOPIC
Bacteria, UA: NONE SEEN
Bilirubin Urine: NEGATIVE
Glucose, UA: NEGATIVE mg/dL
Ketones, ur: NEGATIVE mg/dL
Leukocytes,Ua: NEGATIVE
Nitrite: NEGATIVE
Protein, ur: NEGATIVE mg/dL
Specific Gravity, Urine: 1.014 (ref 1.005–1.030)
pH: 6 (ref 5.0–8.0)

## 2020-12-18 LAB — PROTIME-INR
INR: 1.6 — ABNORMAL HIGH (ref 0.8–1.2)
Prothrombin Time: 19.2 seconds — ABNORMAL HIGH (ref 11.4–15.2)

## 2020-12-18 LAB — CBG MONITORING, ED: Glucose-Capillary: 147 mg/dL — ABNORMAL HIGH (ref 70–99)

## 2020-12-18 MED ORDER — LACTATED RINGERS IV SOLN: INTRAVENOUS | Status: AC

## 2020-12-18 MED ORDER — SODIUM CHLORIDE 0.9 % IV SOLN
2.0000 g | Freq: Two times a day (BID) | INTRAVENOUS | Status: AC
  Administered 2020-12-19 – 2020-12-21 (×6): 2 g via INTRAVENOUS
  Filled 2020-12-18 (×6): qty 2

## 2020-12-18 MED ORDER — AMLODIPINE BESYLATE 5 MG PO TABS: 7.5000 mg | ORAL_TABLET | Freq: Every day | ORAL | Status: DC

## 2020-12-18 MED ORDER — MORPHINE SULFATE (PF) 4 MG/ML IV SOLN
4.0000 mg | Freq: Once | INTRAVENOUS | Status: AC
  Administered 2020-12-18: 4 mg via INTRAVENOUS
  Filled 2020-12-18: qty 1

## 2020-12-18 MED ORDER — LACTATED RINGERS IV BOLUS (SEPSIS)
500.0000 mL | Freq: Once | INTRAVENOUS | Status: AC
  Administered 2020-12-18: 500 mL via INTRAVENOUS

## 2020-12-18 MED ORDER — IPRATROPIUM-ALBUTEROL 0.5-2.5 (3) MG/3ML IN SOLN: 3.0000 mL | RESPIRATORY_TRACT | Status: DC | PRN

## 2020-12-18 MED ORDER — CEFEPIME HCL 2 G IJ SOLR
2.0000 g | Freq: Once | INTRAMUSCULAR | Status: AC
  Administered 2020-12-18: 2 g via INTRAVENOUS
  Filled 2020-12-18: qty 2

## 2020-12-18 MED ORDER — MIRTAZAPINE 15 MG PO TABS
15.0000 mg | ORAL_TABLET | Freq: Every day | ORAL | Status: DC
  Administered 2020-12-18 – 2020-12-24 (×7): 15 mg via ORAL
  Filled 2020-12-18 (×8): qty 1

## 2020-12-18 MED ORDER — LACTATED RINGERS IV BOLUS (SEPSIS)
1000.0000 mL | Freq: Once | INTRAVENOUS | Status: AC
  Administered 2020-12-18: 1000 mL via INTRAVENOUS

## 2020-12-18 MED ORDER — LACTATED RINGERS IV BOLUS (SEPSIS)
250.0000 mL | Freq: Once | INTRAVENOUS | Status: AC
  Administered 2020-12-18: 250 mL via INTRAVENOUS

## 2020-12-18 MED ORDER — FERROUS SULFATE 325 (65 FE) MG PO TABS
325.0000 mg | ORAL_TABLET | Freq: Every day | ORAL | Status: DC
  Administered 2020-12-19 – 2020-12-24 (×6): 325 mg via ORAL
  Filled 2020-12-18 (×6): qty 1

## 2020-12-18 MED ORDER — METRONIDAZOLE 500 MG/100ML IV SOLN
500.0000 mg | Freq: Once | INTRAVENOUS | Status: AC
  Administered 2020-12-18: 500 mg via INTRAVENOUS
  Filled 2020-12-18: qty 100

## 2020-12-18 MED ORDER — PANTOPRAZOLE SODIUM 40 MG PO TBEC
40.0000 mg | DELAYED_RELEASE_TABLET | Freq: Every day | ORAL | Status: DC
  Administered 2020-12-19 – 2020-12-25 (×7): 40 mg via ORAL
  Filled 2020-12-18 (×2): qty 1
  Filled 2020-12-18: qty 2
  Filled 2020-12-18 (×4): qty 1

## 2020-12-18 MED ORDER — MECLIZINE HCL 25 MG PO TABS: 25.0000 mg | ORAL_TABLET | Freq: Three times a day (TID) | ORAL | Status: DC | PRN

## 2020-12-18 MED ORDER — APIXABAN 5 MG PO TABS
5.0000 mg | ORAL_TABLET | Freq: Two times a day (BID) | ORAL | Status: DC
  Administered 2020-12-18 – 2020-12-25 (×14): 5 mg via ORAL
  Filled 2020-12-18 (×14): qty 1

## 2020-12-18 MED ORDER — ATORVASTATIN CALCIUM 80 MG PO TABS
80.0000 mg | ORAL_TABLET | Freq: Every day | ORAL | Status: DC
  Administered 2020-12-19 – 2020-12-25 (×7): 80 mg via ORAL
  Filled 2020-12-18 (×7): qty 1

## 2020-12-18 MED ORDER — VANCOMYCIN HCL 1000 MG/200ML IV SOLN
1000.0000 mg | Freq: Once | INTRAVENOUS | Status: AC
  Administered 2020-12-18: 1000 mg via INTRAVENOUS
  Filled 2020-12-18: qty 200

## 2020-12-18 MED ORDER — VANCOMYCIN HCL 500 MG/100ML IV SOLN
500.0000 mg | Freq: Two times a day (BID) | INTRAVENOUS | Status: AC
  Administered 2020-12-19 – 2020-12-21 (×5): 500 mg via INTRAVENOUS
  Filled 2020-12-18 (×6): qty 100

## 2020-12-18 MED ORDER — LACTATED RINGERS IV SOLN: INTRAVENOUS | Status: DC

## 2020-12-18 MED ORDER — NICOTINE 14 MG/24HR TD PT24
14.0000 mg | MEDICATED_PATCH | Freq: Every day | TRANSDERMAL | Status: DC
  Administered 2020-12-18 – 2020-12-25 (×8): 14 mg via TRANSDERMAL
  Filled 2020-12-18 (×8): qty 1

## 2020-12-18 NOTE — H&P (Addendum)
History and Physical  Kimberly [name] BLT:[id number] DOB: 1956/07/17 DOA: 12/18/2020  Referring physician: Berneice Gandy, [place] PCP: [name], [place]  Outpatient Specialists: Cardiology, neurology. Patient coming from: Home.  Chief Complaint: Lightheadedness and fall.  HPI: Kimberly [name] is a 65 y.o. female with medical history significant for paroxysmal A. fib on Eliquis, prior CVA x2, ascending aortic aneurysm, dilatation of infrarenal abdominal aorta, essential hypertension, hyperlipidemia, chronic anxiety/depression, GERD, tobacco use disorder, physical debility, recently prescribed antibiotics, penicillin on 12/17/2020 for dental caries, who presented to [facility] ED from home after a near syncope event and a fall at home.  Associated with right leg pain.  Earlier today after using the bathroom she walked on her hallway where she lives alone, felt dizzy then fell.  No loss of consciousness.  Reports she has had intermittent lightheadedness after taking her home medications.  Reports compliance with her medications.  After falling she crawled on her knees to get to the phone and called EMS.  Has carpet burns on her knees.  Prior to this event she had intermittent lightheadedness and tooth aches for weeks.  She has denied any chest pain.  Intermittently has had abdominal cramping which she states is chronic.  She was brought into the ED via EMS for further evaluation.  Imagings were unremarkable for any fractures.  CT head and cervical spine nonacute.  While in the ED, she was found to be hypoxic with O2 saturation 87% on room air improved on 2 L.  Chest x-ray nonacute.  Lactic acidosis greater than 4.  Leukocytosis greater than 13,000.  Code sepsis was called and she was started on broad-spectrum IV antibiotics empirically IV vancomycin and cefepime as well as IV fluids as per code sepsis protocol.  No clear source of infection.  TRH, hospitalist team, was asked to admit.  ED Course:  Afebrile.  BP  148/108, pulse 86, respiration rate 26, O2 saturation 94% on 2 L.  Lab studies remarkable for AST 48, ALT 57, CPK 433, CK-MB 7.9.  Lactic acid 4.5, WBC 13.7, hemoglobin 11.4, MCV 71, neutrophil count 11.1, platelet count 229.  Review of Systems: Review of systems as noted in the HPI. All other systems reviewed and are negative.   Past Medical History:  Diagnosis Date  . Bicipital tenosynovitis   . Coronary artery disease 05/2007   [id number]   . Fall 02/13/2018  . Family history of ischemic heart disease   . Gout   . Hammer toe   . Hip, thigh, leg, and ankle, insect bite, nonvenomous, without mention of infection(916.4)   . Hyperlipidemia   . Hypertension   . Other abnormality of red blood cells   . Rotator cuff syndrome of left shoulder   . Stroke [facility])    Past Surgical History:  Procedure Laterality Date  . CARDIAC CATHETERIZATION    . COLONOSCOPY WITH PROPOFOL N/A 01/30/2020   Procedure: COLONOSCOPY WITH PROPOFOL;  Surgeon: Carol Ada, MD;  Location: [facility] ENDOSCOPY;  Service: Endoscopy;  Laterality: N/A;  . CORONARY ARTERY BYPASS GRAFT     triple  . CORONARY ARTERY BYPASS GRAFT  2008  . POLYPECTOMY  01/30/2020   Procedure: POLYPECTOMY;  Surgeon: Carol Ada, MD;  Location: [facility] ENDOSCOPY;  Service: Endoscopy;;  . TUBAL LIGATION      Social History:  reports that she has been smoking cigarettes. She has a 70.00 pack-year smoking history. She quit smokeless tobacco use about 12 years ago. She reports previous alcohol use. She reports previous drug  use. Drug: Marijuana.   Allergies  Allergen Reactions  . Clonidine Derivatives Other (See Comments)    Patch causes scars  . Spironolactone Rash    Family History  Problem Relation Age of Onset  . Hypertension Mother   . Heart attack Father   . Diabetes Father   . Kidney failure Father   . Anxiety disorder Sister   . Sarcoidosis Sister       Prior to Admission medications   Medication Sig Start Date End Date Taking?  Authorizing Provider  amLODipine (NORVASC) 2.5 MG tablet TAKE 3 TABLETS BY MOUTH EVERY DAY Patient taking differently: Take 7.5 mg by mouth daily. 10/04/20   [name], MD  apixaban (ELIQUIS) 5 MG TABS tablet Take 1 tablet (5 mg total) by mouth 2 (two) times daily. 07/14/20   [name], FNP  ASPIRIN LOW DOSE 81 MG EC tablet TAKE 1 TABLET BY MOUTH EVERY DAY 11/08/20   [name], FNP  atorvastatin (LIPITOR) 80 MG tablet Take 1 tablet (80 mg total) by mouth daily. 09/27/20   [name], FNP  Blood Pressure Monitoring (BLOOD PRESSURE KIT) DEVI Use as directed to check blood pressure daily dx: I10 01/20/20   [name], FNP  FEROSUL 325 (65 Fe) MG tablet TAKE 1 TABLET BY MOUTH EVERY DAY WITH BREAKFAST Patient taking differently: Take 325 mg by mouth daily. 10/04/20   [name], MD  hydrocortisone cream 1 % Apply 1 application topically daily as needed for itching.    [provider]  meclizine (ANTIVERT) 25 MG tablet Take 1 tablet (25 mg total) by mouth 3 (three) times daily as needed for dizziness. 04/25/20   Isla Pence, MD  metoprolol tartrate (LOPRESSOR) 50 MG tablet Take 1 tablet (50 mg total) by mouth 2 (two) times daily. 09/30/20 10/30/20  Deberah Pelton, NP  mirtazapine (REMERON) 15 MG tablet Take 1 tablet (15 mg total) by mouth at bedtime. 11/24/20 11/24/21  [name], FNP  Multiple Vitamin (MULTIVITAMIN) capsule Take 1 capsule by mouth daily.    [provider]  omeprazole (PRILOSEC) 20 MG capsule Take 1 capsule (20 mg total) by mouth 2 (two) times daily before a meal. 05/17/20   [name], FNP  polyethylene glycol (MIRALAX / GLYCOLAX) 17 g packet Take 17 g by mouth daily. Patient taking differently: Take 17 g by mouth daily as needed for moderate constipation. 08/23/20   Kayleen Memos, DO  traMADol (ULTRAM) 50 MG tablet Take 1 tablet (50 mg total) by mouth every 6 (six) hours as needed. 11/10/20 11/10/21  [name], FNP    Physical Exam: BP  (!) 148/108   Pulse 83   Temp 98.3 F (36.8 C) (Oral)   Resp (!) 26   SpO2 97%   . General: 65 y.o. year-old female well developed well nourished in no acute distress.  Alert and oriented x3. . Cardiovascular: Irregular rate and rhythm with no rubs or gallops.  No thyromegaly or JVD noted.  1+ pitting edema in lower extremities bilaterally.  [name] Respiratory: Clear to auscultation with no wheezes or rales. Good inspiratory effort. . Abdomen: Soft nontender nondistended with normal bowel sounds x4 quadrants. . Muskuloskeletal: No cyanosis or clubbing.  1+ pitting edema in lower extremities bilaterally. . Neuro: CN II-XII intact, strength, sensation, reflexes . Skin: Scrapes from crawling on her knees after a fall at home . Psychiatry: Judgement and insight appear normal. Mood is appropriate for condition and setting  Labs on Admission:  Basic Metabolic Panel: Recent Labs  Lab 12/18/20 1435  NA 142  K 3.6  CL 111  CO2 21*  GLUCOSE 107*  BUN 21  CREATININE 0.92  CALCIUM 9.6   Liver Function Tests: Recent Labs  Lab 12/18/20 1435  AST 48*  ALT 57*  ALKPHOS 116  BILITOT 0.7  PROT 6.9  ALBUMIN 3.9   No results for input(s): LIPASE, AMYLASE in the last 168 hours. No results for input(s): AMMONIA in the last 168 hours. CBC: Recent Labs  Lab 12/18/20 1435  WBC 13.7*  NEUTROABS 11.1*  HGB 11.4*  HCT 36.8  MCV 71.0*  PLT 229   Cardiac Enzymes: Recent Labs  Lab 12/18/20 1435  CKTOTAL 433*  CKMB 7.9*    BNP (last 3 results) No results for input(s): BNP in the last 8760 hours.  ProBNP (last 3 results) No results for input(s): PROBNP in the last 8760 hours.  CBG: Recent Labs  Lab 12/18/20 1350  GLUCAP 147*    Radiological Exams on Admission: DG Tibia/Fibula Right  Result Date: 12/18/2020 CLINICAL DATA:  Fall with right lower leg pain. EXAM: RIGHT TIBIA AND FIBULA - 2 VIEW COMPARISON:  Knee and ankle radiographs same date. FINDINGS: The bones are  demineralized. No evidence of acute fracture or dislocation. Mild medial compartment joint space narrowing at the knee, scattered vascular calcifications and diffuse soft tissue edema, greatest distally, are again noted. IMPRESSION: No acute osseous findings identified within the right lower leg. Diffuse soft tissue edema/swelling. Electronically Signed   By: Richardean Sale M.D.   On: 12/18/2020 15:16   DG Ankle Complete Right  Result Date: 12/18/2020 CLINICAL DATA:  Fall EXAM: RIGHT ANKLE - COMPLETE 3+ VIEW COMPARISON:  None. FINDINGS: The bones appear diffusely demineralized. No evidence of acute fracture or malalignment. Generalized soft tissue swelling present about the ankle and lower leg. Trace atherosclerotic vascular calcifications. IMPRESSION: Soft tissue swelling about the ankle and lower leg without evidence of acute fracture or malalignment. The bones appear demineralized suggesting underlying osteoporosis. Electronically Signed   By: Jacqulynn Cadet M.D.   On: 12/18/2020 13:52   CT Head Wo Contrast  Result Date: 12/18/2020 CLINICAL DATA:  [place] this morning. Left-sided weakness from a previous stroke. Taking blood thinners. EXAM: CT HEAD WITHOUT CONTRAST CT CERVICAL SPINE WITHOUT CONTRAST TECHNIQUE: Multidetector CT imaging of the head and cervical spine was performed following the standard protocol without intravenous contrast. Multiplanar CT image reconstructions of the cervical spine were also generated. COMPARISON:  Head CT dated 04/25/2020. Cervical spine CT dated 02/13/2018. FINDINGS: CT HEAD FINDINGS Brain: Stable old right occipital lobe infarct. Stable old right thalamic lacunar infarct. Stable mildly enlarged ventricles and cortical sulci. Mild-to-moderate patchy white matter low density in both cerebral hemispheres without significant change. No intracranial hemorrhage, mass lesion or CT evidence of acute infarction. Vascular: Dense bilateral cavernous carotid atheromatous  calcifications. Skull: Normal. Negative for fracture or focal lesion. Sinuses/Orbits: Unremarkable. Other: None. CT CERVICAL SPINE FINDINGS Alignment: Mild reversal of the normal cervical lordosis without significant change. No subluxations. Skull base and vertebrae: No acute fracture. No primary bone lesion or focal pathologic process. Soft tissues and spinal canal: No prevertebral fluid or swelling. No visible canal hematoma. Disc levels:  Multilevel degenerative changes with little change. Upper chest: Mild biapical bullous changes. Other: Dense bilateral carotid artery calcifications. IMPRESSION: 1. No skull fracture or intracranial hemorrhage. 2. No cervical spine fracture or subluxation. 3. Stable cerebral atrophy, chronic small vessel white  matter ischemic changes, old right occipital lobe infarct and old right thalamic lacunar infarct. 4. Stable cervical spine degenerative changes. Electronically Signed   By: Claudie Revering M.D.   On: 12/18/2020 16:06   CT CHEST WO CONTRAST  Result Date: 12/18/2020 CLINICAL DATA:  Possible sepsis. EXAM: CT CHEST WITHOUT CONTRAST TECHNIQUE: Multidetector CT imaging of the chest was performed following the standard protocol without IV contrast. COMPARISON:  September 21, 2019 FINDINGS: Cardiovascular: There is marked severity calcification of the aortic arch and descending thoracic aorta. The ascending thoracic aorta measures approximately 4.8 cm x 4.2 cm. Stable pulmonary artery enlargement is seen. There is moderate severity cardiomegaly with marked severity coronary artery calcification. No pericardial effusion. Mediastinum/Nodes: No enlarged mediastinal or axillary lymph nodes. Thyroid gland, trachea, and esophagus demonstrate no significant findings. Lungs/Pleura: Mild to moderate severity areas of scarring and/or linear atelectasis are seen within the bilateral lower lobes. A 5 mm noncalcified nodular component is seen within the posteromedial aspect of the left lung base  (axial CT image 118, CT series number 4). This represents a new finding when compared to the prior exam. There is no evidence of a pleural effusion or pneumothorax. Upper Abdomen: No acute abnormality. Musculoskeletal: Multiple sternal wires are present. No acute osseous abnormalities are identified. IMPRESSION: 1. Mild to moderate severity bilateral lower lobe linear scarring and/or atelectasis with a small noncalcified nodular component within the left lung base. No follow-up needed if patient is low-risk. Non-contrast chest CT can be considered in 12 months if patient is high-risk. This recommendation follows the consensus statement: Guidelines for Management of Incidental Pulmonary Nodules Detected on CT Images: From the Fleischner Society 2017; Radiology 2017; 284:228-243. 2. 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 3. Additional stable findings suggestive of pulmonary arterial hypertension. Electronically Signed   By: Virgina Norfolk M.D.   On: 12/18/2020 18:39   CT Cervical Spine Wo Contrast  Result Date: 12/18/2020 CLINICAL DATA:  [place] this morning. Left-sided weakness from a previous stroke. Taking blood thinners. EXAM: CT HEAD WITHOUT CONTRAST CT CERVICAL SPINE WITHOUT CONTRAST TECHNIQUE: Multidetector CT imaging of the head and cervical spine was performed following the standard protocol without intravenous contrast. Multiplanar CT image reconstructions of the cervical spine were also generated. COMPARISON:  Head CT dated 04/25/2020. Cervical spine CT dated 02/13/2018. FINDINGS: CT HEAD FINDINGS Brain: Stable old right occipital lobe infarct. Stable old right thalamic lacunar infarct. Stable mildly enlarged ventricles and cortical sulci. Mild-to-moderate patchy white matter low density in both cerebral hemispheres without significant change. No intracranial hemorrhage, mass lesion or CT evidence of acute infarction. Vascular: Dense bilateral cavernous carotid atheromatous  calcifications. Skull: Normal. Negative for fracture or focal lesion. Sinuses/Orbits: Unremarkable. Other: None. CT CERVICAL SPINE FINDINGS Alignment: Mild reversal of the normal cervical lordosis without significant change. No subluxations. Skull base and vertebrae: No acute fracture. No primary bone lesion or focal pathologic process. Soft tissues and spinal canal: No prevertebral fluid or swelling. No visible canal hematoma. Disc levels:  Multilevel degenerative changes with little change. Upper chest: Mild biapical bullous changes. Other: Dense bilateral carotid artery calcifications. IMPRESSION: 1. No skull fracture or intracranial hemorrhage. 2. No cervical spine fracture or subluxation. 3. Stable cerebral atrophy, chronic small vessel white matter ischemic changes, old right occipital lobe infarct and old right thalamic lacunar infarct. 4. Stable cervical spine degenerative changes. Electronically Signed   By: Claudie Revering M.D.   On: 12/18/2020 16:06   DG Pelvis Portable  Result Date: 12/18/2020 CLINICAL  DATA:  Patient fell. EXAM: PORTABLE PELVIS 1-2 VIEWS COMPARISON:  Pelvic CT 08/19/2020. FINDINGS: The bones appear mildly demineralized. No evidence of acute fracture, dislocation or femoral head avascular necrosis. The hip joint spaces are preserved. There are mild sacroiliac degenerative changes bilaterally. Diffuse vascular calcifications are noted. IMPRESSION: No acute osseous findings. Electronically Signed   By: Richardean Sale M.D.   On: 12/18/2020 15:12   DG Chest Port 1 View  Result Date: 12/18/2020 CLINICAL DATA:  [place].  Smoker. EXAM: PORTABLE CHEST 1 VIEW COMPARISON:  08/18/2020 FINDINGS: Stable enlarged cardiac silhouette and post CABG changes. The aorta remains tortuous and partially calcified. Clear lungs. The lungs remain mildly hyperexpanded. Unremarkable bones. IMPRESSION: 1. No acute abnormality. 2. Stable cardiomegaly and changes of COPD. Electronically Signed   By: Claudie Revering M.D.    On: 12/18/2020 15:12   DG Knee Complete 4 Views Left  Result Date: 12/18/2020 CLINICAL DATA:  Left knee pain following a fall today. EXAM: LEFT KNEE - COMPLETE 4+ VIEW COMPARISON:  03/24/2018 FINDINGS: The bones appear osteopenic. No fracture, dislocation or effusion. Atheromatous arterial calcifications. IMPRESSION: 1. No fracture. 2. Atheromatous arterial calcifications. Electronically Signed   By: Claudie Revering M.D.   On: 12/18/2020 13:53   DG Knee Complete 4 Views Right  Result Date: 12/18/2020 CLINICAL DATA:  Right knee pain following a fall today. EXAM: RIGHT KNEE - COMPLETE 4+ VIEW COMPARISON:  None. FINDINGS: Diffuse osteopenia. Mild medial joint space narrowing. No fracture, no fracture or dislocation. Small effusion. Mild diffuse soft tissue swelling. Atheromatous arterial calcifications. IMPRESSION: 1. Small effusion. 2. No fracture or dislocation seen. 3. Mild medial joint space narrowing. 4. Atheromatous arterial calcifications. Electronically Signed   By: Claudie Revering M.D.   On: 12/18/2020 13:55   DG Foot Complete Right  Result Date: 12/18/2020 CLINICAL DATA:  Fall onto right side.  Ankle pain. EXAM: RIGHT FOOT COMPLETE - 3+ VIEW COMPARISON:  Ankle radiographs same date. FINDINGS: The bones are demineralized. There is no evidence of acute fracture or dislocation. Toe evaluation is limited by positioning on the AP and oblique views. The toes appear unremarkable on the lateral view. No focal soft tissue swelling or foreign body identified in the foot. IMPRESSION: Osteopenia without acute osseous findings in the foot. Electronically Signed   By: Richardean Sale M.D.   On: 12/18/2020 15:14    EKG: I independently viewed the EKG done and my findings are as followed: A. fib at rate of 98.  Nonspecific ST-T changes.  QTc 574.  Assessment/Plan Present on Admission: . Sepsis ([place])  Active Problems:   Sepsis ([place])  Sepsis of unclear etiology, possibly secondary to dental caries. Presented  due to lightheadedness and a fall at home associated with right leg pain post fall. No evidence of fracture on imagings. CT head and cervical spine nonacute On presentation lactic acidosis greater than 4, leukocytosis greater than 13,000 Unknown source of infection Urine analysis negative for pyuria Chest imaging nonacute. Recently diagnosed dental caries on penicillin Continue IV antibiotics empirically, cefepime and IV vancomycin. Obtain MRSA screening Continue gentle IV fluid hydration Follow cultures Repeat lactic acid level Repeat CBC with differential in the morning Monitor fever curve and WBC Maintain MAP greater than 65  Near syncope and fall Obtain orthostatic vital signs Continue IV fluids hydration PT OT to assess Fall precautions  Acute hypoxic respiratory failure, unclear etiology O2 saturation 87% on room air on presentation CT chest nonrevealing Incentive spirometer when awake Mobilize as tolerated Maintain  oxygen saturation greater than 92% Home O2 evaluation on 12/18/20 for DC planning.  Bilateral lower extremity edema Obtain BNP Obtain limited 2D echo Closely monitor volume status while on gentle IV fluid hydration, rate reduced to LR 50 cc/h x 1 day.  Elevated CK-MB CK-MB 7.9 on presentation Troponin pending Follow limited 2D echo the  COPD with ongoing tobacco use Tobacco use since the age of 78 Has tried to cut down, 1 pack of cigarettes lasting 2 to 3 days Tobacco cessation counseling done at bedside Nicotine patch as needed  Paroxysmal A-fib on Eliquis CT head non acute Resume home Eliquis  History of CVA x2 She is on Eliquis for CVA prevention She is also on aspirin and Lipitor  Elevated liver chemistries AST and ALT elevated Closely monitor while on statin Obtain CMP in the morning  QTC prolongation QTC 574 on 12 lead EKG Avoid QTC prolonging agents Obtain magnesium level Optimize potassium and magnesium level Repeat 12 lead EKG  in the AM  Elevated CPK post fall CPK 433 Continue IV fluid  Chronic microcytic anemia Hemoglobin stable 11.4, MCV 71 Continue home iron supplement Monitor H&H  History of dizziness Resume home meclizine as needed PT OT to assess Fall precautions TOC consulted to assist with home health services needs or SNF placement  Pulmonary nodule seen on CT scan done on 12/18/2020 Patient made aware of this finding Advised to repeat noncontrast chest CT in 12 months.  Ascending aorta aneurysm measures approximately 4.8 cm x 4.2 cm on CT scan Per radiology report stable aneurysmal dilatation of the ascending thoracic aorta Follow-up with cardiology  Pulmonary artery hypertension Findings suggestive of pulmonary arterial hypertension on CT scan Follow 2D echo   DVT prophylaxis: Eliquis.  Code Status: Full code.  Family Communication: None at bedside.  Disposition Plan: Admit to medical telemetry.  Consults called: None.  Admission status: Inpatient status.  Patient will require at least 2 midnights for further evaluation and treatment of present condition.   Status is: Inpatient    Dispo:  Patient From: Home  Planned Disposition: Home, possibly on 12/20/2020 once symptomatology has improved.  Medically stable for discharge: No         Kayleen Memos MD [facility] Pager [phone]  If 7PM-7AM, please contact night-coverage [email or web address] Password [facility]  12/18/2020, 7:41 PM

## 2020-12-18 NOTE — Sepsis Progress Note (Signed)
Sepsis protocol is being followed by [facility]. 

## 2020-12-18 NOTE — ED Provider Notes (Signed)
Pt on Eliquis, has a BM in the AM, walking on hallway, had a presyncopal episode and fell.  R leg pain, but also hypoxic upon arrival.  Lives at home alone.   Received signout at beginning of shift, please see previous providers notes for complete H&P.  This is a 65 year old female with history of prior stroke currently on Eliquis who presents for evaluation of a fall and near syncopal episode.  Patient use the bathroom this morning, walked on the, felt lightheadedness fell and landed on the right side.  There was quite a bit of pain to her right leg.  Denies hitting her head or loss of consciousness.  She was found to be hypoxic upon arrival with O2 sats in the 80s improved with supplemental oxygen.  She does not normally wear oxygen.  She endorsed mild shortness of breath but endorsed most of her complaint is due to pain to her leg.  She has had prior urinary tract infection several weeks ago treated with antibiotic.  She also has been endorsing some pain to her left upper molar that she was seen by dentist yesterday and was prescribed penicillin.  She denies any other sickness.  She denies nausea vomiting diarrhea.  She has been compliant with her Eliquis.  Unsure COVID status.  Work-up today is remarkable for hypoxia with O2 sat in the 80s on room air, tachypnea.  She has normal renal function therefore will obtain chest CT angiogram to rule out PE.  Patient also has a white count of 13.7 and an elevated lactic acid of 4.5.  No obvious signs of dental infection but due to potential sepsis as patient meets SIRS criteria, code sepsis initiated and will initiate broad-spectrum antibiotic.  Fortunately her imaging including head and cervical spine CT without acute changes.  Chest x-ray is unremarkable, x-ray of her pelvis, right tib-fib, right ankle, right knee, and right foot without acute fracture.  There are edema and soft tissue swelling to her knee and her ankle, likely consistent with a sprain.  She does  have some skin abrasion to her left foot.  Will consult medicine for admission. UA is currently pending.  Pt was on Clindamycin.    4:40 PM due to national shortage of contrast media, patient is currently compliant with Eliquis, will obtain chest CT without contrast to assess for pleural effusion or pleurisy   7:07 PM Fortunately UA did not show any evidence of urinary tract infection. Urine culture sent.  Chest CT without any acute changes.  There is mild to moderate severity bilateral lower lobe linear scarring or atelectasis.  7:28 PM Appreciate consultation from [facility] Dr. Justin Mend who agrees to see and will admit pt for sepsis of unknown origin and new oxygen requirement.    BP (!) 148/108   Pulse 83   Temp 98.3 F (36.8 C) (Oral)   Resp (!) 26   SpO2 97%   Results for orders placed or performed during the hospital encounter of 12/18/20  Comprehensive metabolic panel  Result Value Ref Range   Sodium 142 135 - 145 mmol/L   Potassium 3.6 3.5 - 5.1 mmol/L   Chloride 111 98 - 111 mmol/L   CO2 21 (L) 22 - 32 mmol/L   Glucose, Bld 107 (H) 70 - 99 mg/dL   BUN 21 8 - 23 mg/dL   Creatinine, Ser 0.92 0.44 - 1.00 mg/dL   Calcium 9.6 8.9 - 10.3 mg/dL   Total Protein 6.9 6.5 - 8.1 g/dL  Albumin 3.9 3.5 - 5.0 g/dL   AST 48 (H) 15 - 41 U/L   ALT 57 (H) 0 - 44 U/L   Alkaline Phosphatase 116 38 - 126 U/L   Total Bilirubin 0.7 0.3 - 1.2 mg/dL   GFR, Estimated >60 >60 mL/min   Anion gap 10 5 - 15  Urinalysis, Routine w reflex microscopic Urine, Clean Catch  Result Value Ref Range   Color, Urine YELLOW YELLOW   APPearance CLEAR CLEAR   Specific Gravity, Urine 1.014 1.005 - 1.030   pH 6.0 5.0 - 8.0   Glucose, UA NEGATIVE NEGATIVE mg/dL   Hgb urine dipstick SMALL (A) NEGATIVE   Bilirubin Urine NEGATIVE NEGATIVE   Ketones, ur NEGATIVE NEGATIVE mg/dL   Protein, ur NEGATIVE NEGATIVE mg/dL   Nitrite NEGATIVE NEGATIVE   Leukocytes,Ua NEGATIVE NEGATIVE   RBC / HPF 0-5 0 - 5  RBC/hpf   WBC, UA 0-5 0 - 5 WBC/hpf   Bacteria, UA NONE SEEN NONE SEEN   Squamous Epithelial / LPF 0-5 0 - 5  Lactic acid, plasma  Result Value Ref Range   Lactic Acid, Venous 4.5 (HH) 0.5 - 1.9 mmol/L  CK total and CKMB  Result Value Ref Range   Total CK 433 (H) 38 - 234 U/L   CK, MB 7.9 (H) 0.5 - 5.0 ng/mL   Relative Index 1.8 0.0 - 2.5  CBC WITH DIFFERENTIAL  Result Value Ref Range   WBC 13.7 (H) 4.0 - 10.5 K/uL   RBC 5.18 (H) 3.87 - 5.11 MIL/uL   Hemoglobin 11.4 (L) 12.0 - 15.0 g/dL   HCT 36.8 36.0 - 46.0 %   MCV 71.0 (L) 80.0 - 100.0 fL   MCH 22.0 (L) 26.0 - 34.0 pg   MCHC 31.0 30.0 - 36.0 g/dL   RDW 16.6 (H) 11.5 - 15.5 %   Platelets 229 150 - 400 K/uL   nRBC 0.0 0.0 - 0.2 %   Neutrophils Relative % 82 %   Neutro Abs 11.1 (H) 1.7 - 7.7 K/uL   Lymphocytes Relative 13 %   Lymphs Abs 1.8 0.7 - 4.0 K/uL   Monocytes Relative 5 %   Monocytes Absolute 0.7 0.1 - 1.0 K/uL   Eosinophils Relative 0 %   Eosinophils Absolute 0.0 0.0 - 0.5 K/uL   Basophils Relative 0 %   Basophils Absolute 0.0 0.0 - 0.1 K/uL   Immature Granulocytes 0 %   Abs Immature Granulocytes 0.06 0.00 - 0.07 K/uL  Protime-INR  Result Value Ref Range   Prothrombin Time 19.2 (H) 11.4 - 15.2 seconds   INR 1.6 (H) 0.8 - 1.2  CBG monitoring, ED  Result Value Ref Range   Glucose-Capillary 147 (H) 70 - 99 mg/dL  Sample to Blood Bank  Result Value Ref Range   Blood Bank Specimen SAMPLE AVAILABLE FOR TESTING    Sample Expiration      12/19/2020,2359 Performed at [facility], [address], [place], [place] [address]    DG Tibia/Fibula Right  Result Date: 12/18/2020 CLINICAL DATA:  Fall with right lower leg pain. EXAM: RIGHT TIBIA AND FIBULA - 2 VIEW COMPARISON:  Knee and ankle radiographs same date. FINDINGS: The bones are demineralized. No evidence of acute fracture or dislocation. Mild medial compartment joint space narrowing at the knee, scattered vascular calcifications and diffuse soft tissue  edema, greatest distally, are again noted. IMPRESSION: No acute osseous findings identified within the right lower leg. Diffuse soft tissue edema/swelling. Electronically Signed  By: Richardean Sale M.D.   On: 12/18/2020 15:16   DG Ankle Complete Right  Result Date: 12/18/2020 CLINICAL DATA:  Fall EXAM: RIGHT ANKLE - COMPLETE 3+ VIEW COMPARISON:  None. FINDINGS: The bones appear diffusely demineralized. No evidence of acute fracture or malalignment. Generalized soft tissue swelling present about the ankle and lower leg. Trace atherosclerotic vascular calcifications. IMPRESSION: Soft tissue swelling about the ankle and lower leg without evidence of acute fracture or malalignment. The bones appear demineralized suggesting underlying osteoporosis. Electronically Signed   By: Jacqulynn Cadet M.D.   On: 12/18/2020 13:52   CT Head Wo Contrast  Result Date: 12/18/2020 CLINICAL DATA:  [place] this morning. Left-sided weakness from a previous stroke. Taking blood thinners. EXAM: CT HEAD WITHOUT CONTRAST CT CERVICAL SPINE WITHOUT CONTRAST TECHNIQUE: Multidetector CT imaging of the head and cervical spine was performed following the standard protocol without intravenous contrast. Multiplanar CT image reconstructions of the cervical spine were also generated. COMPARISON:  Head CT dated 04/25/2020. Cervical spine CT dated 02/13/2018. FINDINGS: CT HEAD FINDINGS Brain: Stable old right occipital lobe infarct. Stable old right thalamic lacunar infarct. Stable mildly enlarged ventricles and cortical sulci. Mild-to-moderate patchy white matter low density in both cerebral hemispheres without significant change. No intracranial hemorrhage, mass lesion or CT evidence of acute infarction. Vascular: Dense bilateral cavernous carotid atheromatous calcifications. Skull: Normal. Negative for fracture or focal lesion. Sinuses/Orbits: Unremarkable. Other: None. CT CERVICAL SPINE FINDINGS Alignment: Mild reversal of the normal cervical  lordosis without significant change. No subluxations. Skull base and vertebrae: No acute fracture. No primary bone lesion or focal pathologic process. Soft tissues and spinal canal: No prevertebral fluid or swelling. No visible canal hematoma. Disc levels:  Multilevel degenerative changes with little change. Upper chest: Mild biapical bullous changes. Other: Dense bilateral carotid artery calcifications. IMPRESSION: 1. No skull fracture or intracranial hemorrhage. 2. No cervical spine fracture or subluxation. 3. Stable cerebral atrophy, chronic small vessel white matter ischemic changes, old right occipital lobe infarct and old right thalamic lacunar infarct. 4. Stable cervical spine degenerative changes. Electronically Signed   By: Claudie Revering M.D.   On: 12/18/2020 16:06   CT CHEST WO CONTRAST  Result Date: 12/18/2020 CLINICAL DATA:  Possible sepsis. EXAM: CT CHEST WITHOUT CONTRAST TECHNIQUE: Multidetector CT imaging of the chest was performed following the standard protocol without IV contrast. COMPARISON:  September 21, 2019 FINDINGS: Cardiovascular: There is marked severity calcification of the aortic arch and descending thoracic aorta. The ascending thoracic aorta measures approximately 4.8 cm x 4.2 cm. Stable pulmonary artery enlargement is seen. There is moderate severity cardiomegaly with marked severity coronary artery calcification. No pericardial effusion. Mediastinum/Nodes: No enlarged mediastinal or axillary lymph nodes. Thyroid gland, trachea, and esophagus demonstrate no significant findings. Lungs/Pleura: Mild to moderate severity areas of scarring and/or linear atelectasis are seen within the bilateral lower lobes. A 5 mm noncalcified nodular component is seen within the posteromedial aspect of the left lung base (axial CT image 118, CT series number 4). This represents a new finding when compared to the prior exam. There is no evidence of a pleural effusion or pneumothorax. Upper Abdomen: No  acute abnormality. Musculoskeletal: Multiple sternal wires are present. No acute osseous abnormalities are identified. IMPRESSION: 1. Mild to moderate severity bilateral lower lobe linear scarring and/or atelectasis with a small noncalcified nodular component within the left lung base. No follow-up needed if patient is low-risk. Non-contrast chest CT can be considered in 12 months if patient is high-risk. This recommendation  follows the consensus statement: Guidelines for Management of Incidental Pulmonary Nodules Detected on CT Images: From the Fleischner Society 2017; Radiology 2017; 284:228-243. 2. 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 3. Additional stable findings suggestive of pulmonary arterial hypertension. Electronically Signed   By: Virgina Norfolk M.D.   On: 12/18/2020 18:39   CT Cervical Spine Wo Contrast  Result Date: 12/18/2020 CLINICAL DATA:  [place] this morning. Left-sided weakness from a previous stroke. Taking blood thinners. EXAM: CT HEAD WITHOUT CONTRAST CT CERVICAL SPINE WITHOUT CONTRAST TECHNIQUE: Multidetector CT imaging of the head and cervical spine was performed following the standard protocol without intravenous contrast. Multiplanar CT image reconstructions of the cervical spine were also generated. COMPARISON:  Head CT dated 04/25/2020. Cervical spine CT dated 02/13/2018. FINDINGS: CT HEAD FINDINGS Brain: Stable old right occipital lobe infarct. Stable old right thalamic lacunar infarct. Stable mildly enlarged ventricles and cortical sulci. Mild-to-moderate patchy white matter low density in both cerebral hemispheres without significant change. No intracranial hemorrhage, mass lesion or CT evidence of acute infarction. Vascular: Dense bilateral cavernous carotid atheromatous calcifications. Skull: Normal. Negative for fracture or focal lesion. Sinuses/Orbits: Unremarkable. Other: None. CT CERVICAL SPINE FINDINGS Alignment: Mild reversal of the normal cervical  lordosis without significant change. No subluxations. Skull base and vertebrae: No acute fracture. No primary bone lesion or focal pathologic process. Soft tissues and spinal canal: No prevertebral fluid or swelling. No visible canal hematoma. Disc levels:  Multilevel degenerative changes with little change. Upper chest: Mild biapical bullous changes. Other: Dense bilateral carotid artery calcifications. IMPRESSION: 1. No skull fracture or intracranial hemorrhage. 2. No cervical spine fracture or subluxation. 3. Stable cerebral atrophy, chronic small vessel white matter ischemic changes, old right occipital lobe infarct and old right thalamic lacunar infarct. 4. Stable cervical spine degenerative changes. Electronically Signed   By: Claudie Revering M.D.   On: 12/18/2020 16:06   DG Pelvis Portable  Result Date: 12/18/2020 CLINICAL DATA:  Patient fell. EXAM: PORTABLE PELVIS 1-2 VIEWS COMPARISON:  Pelvic CT 08/19/2020. FINDINGS: The bones appear mildly demineralized. No evidence of acute fracture, dislocation or femoral head avascular necrosis. The hip joint spaces are preserved. There are mild sacroiliac degenerative changes bilaterally. Diffuse vascular calcifications are noted. IMPRESSION: No acute osseous findings. Electronically Signed   By: Richardean Sale M.D.   On: 12/18/2020 15:12   DG Chest Port 1 View  Result Date: 12/18/2020 CLINICAL DATA:  [place].  Smoker. EXAM: PORTABLE CHEST 1 VIEW COMPARISON:  08/18/2020 FINDINGS: Stable enlarged cardiac silhouette and post CABG changes. The aorta remains tortuous and partially calcified. Clear lungs. The lungs remain mildly hyperexpanded. Unremarkable bones. IMPRESSION: 1. No acute abnormality. 2. Stable cardiomegaly and changes of COPD. Electronically Signed   By: Claudie Revering M.D.   On: 12/18/2020 15:12   DG Knee Complete 4 Views Left  Result Date: 12/18/2020 CLINICAL DATA:  Left knee pain following a fall today. EXAM: LEFT KNEE - COMPLETE 4+ VIEW COMPARISON:   03/24/2018 FINDINGS: The bones appear osteopenic. No fracture, dislocation or effusion. Atheromatous arterial calcifications. IMPRESSION: 1. No fracture. 2. Atheromatous arterial calcifications. Electronically Signed   By: Claudie Revering M.D.   On: 12/18/2020 13:53   DG Knee Complete 4 Views Right  Result Date: 12/18/2020 CLINICAL DATA:  Right knee pain following a fall today. EXAM: RIGHT KNEE - COMPLETE 4+ VIEW COMPARISON:  None. FINDINGS: Diffuse osteopenia. Mild medial joint space narrowing. No fracture, no fracture or dislocation. Small effusion. Mild diffuse soft tissue swelling. Atheromatous  arterial calcifications. IMPRESSION: 1. Small effusion. 2. No fracture or dislocation seen. 3. Mild medial joint space narrowing. 4. Atheromatous arterial calcifications. Electronically Signed   By: Claudie Revering M.D.   On: 12/18/2020 13:55   DG Foot Complete Right  Result Date: 12/18/2020 CLINICAL DATA:  Fall onto right side.  Ankle pain. EXAM: RIGHT FOOT COMPLETE - 3+ VIEW COMPARISON:  Ankle radiographs same date. FINDINGS: The bones are demineralized. There is no evidence of acute fracture or dislocation. Toe evaluation is limited by positioning on the AP and oblique views. The toes appear unremarkable on the lateral view. No focal soft tissue swelling or foreign body identified in the foot. IMPRESSION: Osteopenia without acute osseous findings in the foot. Electronically Signed   By: Richardean Sale M.D.   On: 12/18/2020 15:14    .Critical Care Performed by: Domenic Moras, PA-C Authorized by: Domenic Moras, PA-C   Critical care provider statement:    Critical care time (minutes):  40   Critical care was time spent personally by me on the following activities:  Discussions with consultants, evaluation of patient's response to treatment, examination of patient, ordering and performing treatments and interventions, ordering and review of laboratory studies, ordering and review of radiographic studies, pulse  oximetry, re-evaluation of patient's condition, obtaining history from patient or surrogate and review of old charts        Domenic Moras, Hershal Coria 12/18/20 1929    Wyvonnia Dusky, MD 12/19/20 1113

## 2020-12-18 NOTE — ED Provider Notes (Signed)
[facility] EMERGENCY DEPARTMENT Provider Note   CSN: [id number] Arrival date & time: 12/18/20  1230     History No chief complaint on file.   Kimberly [name] is a 65 y.o. 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  On my examination, patient reports that this morning at approximately 6:30 AM she was walking from her bathroom back to her bedroom after having a bowel movement.  In the hallway she became lightheaded and felt as though she was going to pass out.  She then remembers falling backward.  Her daughter at bedside stated that this is exactly how she presented the last time she had a stroke, with sudden onset lightheadedness and disequilibrium.  Patient complains predominantly of right lower leg pain.  She states that her knee, ankle, and foot are hurting her tremendously.  She states that she was given pain medication by EMS which is likely what she was hypoxic here in the ED.  She is requiring 3 L supplemental oxygen to maintain saturation of 90%, no prior oxygen requirements.  When she experienced lightheadedness, she was denying any associated palpitations, shortness of breath, or chest pain.  She felt fine yesterday.  Her daughter who was at bedside lives with her.  Patient arrived in a c-collar by EMS, but is adamantly denying any head injury or neck pain.  She states that she had been taking her medications, as directed.  Patient's lightheadedness has largely resolved, but she does endorse floaters in visual field.   Patient denies any room spinning dizziness, recent illness or infection, fevers or chills, blurred vision or diplopia, new numbness or weakness, incontinence, history of seizure disorder, loss of consciousness, headache, neck pain,  chest pain, or any other symptoms.  HPI     Past Medical History:  Diagnosis Date  . Bicipital tenosynovitis   . Coronary artery disease 05/2007   [id number]   . Fall 02/13/2018  . Family history of ischemic heart disease   . Gout   . Hammer toe   . Hip, thigh, leg, and ankle, insect bite, nonvenomous, without mention of infection(916.4)   . Hyperlipidemia   . Hypertension   . Other abnormality of red blood cells   . Rotator cuff syndrome of left shoulder   . Stroke [facility])     Patient Active Problem List   Diagnosis Date Noted  . Family history of malignant neoplasm of gastrointestinal tract 12/07/2020  . Personal history of colonic polyps 12/07/2020  . Secondary hypercoagulable state ([place]) 10/14/2020  . Right sided abdominal pain   . Abdominal wall pain   . Atrial fibrillation ([place]) 08/19/2020  . Educated about COVID-19 virus infection 11/16/2019  . Urinary frequency 04/14/2019  . Cough 04/14/2019  . AKI (acute kidney injury) ([place]) 11/26/2018  . Diarrhea 11/25/2018  . Dehydration, mild 11/25/2018  . Hemiparesis affecting nondominant 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 ([place]) 07/31/2018  . Muscle ache 07/31/2018  . Prediabetes 07/31/2018  . Chronic gout without tophus 07/31/2018  . Vision loss 07/31/2018  . Malnutrition of moderate degree 02/15/2018  . Carotid occlusion, bilateral   . Atrial fibrillation with RVR ([place]) 02/13/2018  . Positive D dimer 02/13/2018  . Back pain 02/13/2018  . CVA (cerebral vascular accident) ([place]) 02/13/2018  . Paroxysmal  atrial fibrillation ([place]) 02/13/2018  . Coronary artery disease involving native coronary artery of native heart without angina pectoris 02/13/2018  . Apical variant hypertrophic cardiomyopathy ([place]) 02/13/2018  . 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 Aortic atherosclerosis ([place]) 02/13/2018  . CAD, ARTERY BYPASS GRAFT 05/03/2009  . TOBACCO USE, QUIT 05/03/2009  . ROTATOR CUFF SYNDROME, LEFT 02/12/2009   . Shoulder pain, left 11/03/2008  . HAMMER TOE, ACQUIRED 02/11/2008  . INSECT BITE, LEG 02/11/2008  . Cerebral artery occlusion with cerebral infarction ([place]) 12/05/2007  . Dyslipidemia 11/05/2007  . UNSPECIFIED DISORDER TEETH&SUPPORTING STRUCTURES 11/05/2007  . BICEPS TENDINITIS, RIGHT 04/24/2007  . Essential hypertension 04/23/2007  . MICROCYTOSIS 04/23/2007    Past Surgical History:  Procedure Laterality Date  . CARDIAC CATHETERIZATION    . COLONOSCOPY WITH PROPOFOL N/A 01/30/2020   Procedure: COLONOSCOPY WITH PROPOFOL;  Surgeon: Carol Ada, MD;  Location: [facility] ENDOSCOPY;  Service: Endoscopy;  Laterality: N/A;  . CORONARY ARTERY BYPASS GRAFT     triple  . CORONARY ARTERY BYPASS GRAFT  2008  . POLYPECTOMY  01/30/2020   Procedure: POLYPECTOMY;  Surgeon: Carol Ada, MD;  Location: [facility] ENDOSCOPY;  Service: Endoscopy;;  . TUBAL LIGATION       OB History   No obstetric history on file.     Family History  Problem Relation Age of Onset  . Hypertension Mother   . Heart attack Father   . Diabetes Father   . Kidney failure Father   . Anxiety disorder Sister   . Sarcoidosis Sister     Social History   Tobacco Use  . Smoking status: Current Some Day Smoker    Packs/day: 2.00    Years: 35.00    Pack years: 70.00    Types: Cigarettes    Last attempt to quit: 08/12/2019    Years since quitting: 1.3  . Smokeless tobacco: Former Systems developer    Quit date: 08/09/2008  . Tobacco comment: 3-4 cigarettes daily  Vaping Use  . Vaping Use: Never used  Substance Use Topics  . Alcohol use: Not Currently    Comment: ocassionally wine  . Drug use: Not Currently    Types: Marijuana    Comment: history of cocaine use    Home Medications Prior to Admission medications   Medication Sig Start Date End Date Taking? Authorizing Provider  amLODipine (NORVASC) 2.5 MG tablet TAKE 3 TABLETS BY MOUTH EVERY DAY Patient taking differently: Take 7.5 mg by mouth daily. 10/04/20   [name],  MD  apixaban (ELIQUIS) 5 MG TABS tablet Take 1 tablet (5 mg total) by mouth 2 (two) times daily. 07/14/20   [name], FNP  ASPIRIN LOW DOSE 81 MG EC tablet TAKE 1 TABLET BY MOUTH EVERY DAY 11/08/20   [name], FNP  atorvastatin (LIPITOR) 80 MG tablet Take 1 tablet (80 mg total) by mouth daily. 09/27/20   [name], FNP  Blood Pressure Monitoring (BLOOD PRESSURE KIT) DEVI Use as directed to check blood pressure daily dx: I10 01/20/20   [name], FNP  FEROSUL 325 (65 Fe) MG tablet TAKE 1 TABLET BY MOUTH EVERY DAY WITH BREAKFAST Patient taking differently: Take 325 mg by mouth daily. 10/04/20   [name], MD  hydrocortisone cream 1 % Apply 1 application topically daily as needed for itching.    [provider]  meclizine (ANTIVERT) 25 MG tablet Take 1 tablet (25 mg total) by mouth 3 (three) times daily as needed for dizziness. 04/25/20   Isla Pence, MD  metoprolol tartrate (LOPRESSOR) 50 MG tablet Take 1 tablet (50 mg total) by mouth 2 (two) times daily. 09/30/20 10/30/20  Deberah Pelton, NP  mirtazapine (REMERON) 15 MG tablet Take 1 tablet (15 mg total) by mouth at bedtime. 11/24/20 11/24/21  [name], FNP  Multiple Vitamin (MULTIVITAMIN) capsule Take 1 capsule by mouth daily.    [provider]  omeprazole (PRILOSEC) 20 MG capsule Take 1 capsule (20 mg total) by mouth 2 (two) times daily before a meal. 05/17/20   [name], FNP  polyethylene glycol (MIRALAX / GLYCOLAX) 17 g packet Take 17 g by mouth daily. Patient taking differently: Take 17 g by mouth daily as needed for moderate constipation. 08/23/20   Kayleen Memos, DO  traMADol (ULTRAM) 50 MG tablet Take 1 tablet (50 mg total) by mouth every 6 (six) hours as needed. 11/10/20 11/10/21  [name], FNP    Allergies    Clonidine derivatives and Spironolactone  Review of Systems   Review of Systems  All other systems reviewed and are negative.   Physical Exam Updated Vital Signs BP (!)  156/113   Pulse 82   Temp 98.3 F (36.8 C) (Oral)   Resp (!) 30   SpO2 95%   Physical Exam Vitals and nursing note reviewed. Exam conducted with a chaperone present.  Constitutional:      Appearance: Normal appearance.  HENT:     Head: Normocephalic and atraumatic.     Comments: No palpable skull defects. Eyes:     General: No scleral icterus.    Extraocular Movements: Extraocular movements intact.     Conjunctiva/sclera: Conjunctivae normal.     Pupils: Pupils are equal, round, and reactive to light.     Comments: No nystagmus.  Neck:     Comments: ROM fully intact.  No midline cervical tenderness to palpation. Cardiovascular:     Rate and Rhythm: Normal rate.     Pulses: Normal pulses.  Pulmonary:     Effort: Pulmonary effort is normal.     Breath sounds: No wheezing or rales.  Abdominal:     General: Abdomen is flat. There is no distension.     Tenderness: There is no abdominal tenderness.  Musculoskeletal:        General: Swelling present.     Cervical back: Normal range of motion and neck supple. No rigidity.     Comments: Reluctance to flex either leg at the level of the hip.   Right leg: Notable swelling in right lower leg relative to contralateral leg.  Compartments are soft.  Pedal pulse is intact.  Feet are warm, perfused.  Sensation grossly intact bilaterally.  Diminished on left side (chronic). Left arm weakness relative to right and with mildly diminished sensation (chronic).  Radial pulses intact and symmetric.  Skin:    General: Skin is dry.  Neurological:     Mental Status: She is alert and oriented to person, place, and time. Mental status is at baseline.     GCS: GCS eye subscore is 4. GCS verbal subscore is 5. GCS motor subscore is 6.     Coordination: Coordination normal.     Comments: Baseline strength and sensation bilaterally.  PERRL and EOM intact.  No nystagmus.  Psychiatric:        Mood and Affect: Mood normal.        Behavior: Behavior normal.         Thought Content: Thought content normal.     ED Results /  Procedures / Treatments   Labs (all labs ordered are listed, but only abnormal results are displayed) Labs Reviewed  CBG MONITORING, ED - Abnormal; Notable for the following components:      Result Value   Glucose-Capillary 147 (*)    All other components within normal limits  COMPREHENSIVE METABOLIC PANEL  URINALYSIS, ROUTINE W REFLEX MICROSCOPIC  LACTIC ACID, PLASMA  PROTIME-INR  CK TOTAL AND CKMB (NOT AT [facility])  CBC WITH DIFFERENTIAL/PLATELET  SAMPLE TO BLOOD BANK    EKG None  Radiology DG Ankle Complete Right  Result Date: 12/18/2020 CLINICAL DATA:  Fall EXAM: RIGHT ANKLE - COMPLETE 3+ VIEW COMPARISON:  None. FINDINGS: The bones appear diffusely demineralized. No evidence of acute fracture or malalignment. Generalized soft tissue swelling present about the ankle and lower leg. Trace atherosclerotic vascular calcifications. IMPRESSION: Soft tissue swelling about the ankle and lower leg without evidence of acute fracture or malalignment. The bones appear demineralized suggesting underlying osteoporosis. Electronically Signed   By: Jacqulynn Cadet M.D.   On: 12/18/2020 13:52   DG Knee Complete 4 Views Left  Result Date: 12/18/2020 CLINICAL DATA:  Left knee pain following a fall today. EXAM: LEFT KNEE - COMPLETE 4+ VIEW COMPARISON:  03/24/2018 FINDINGS: The bones appear osteopenic. No fracture, dislocation or effusion. Atheromatous arterial calcifications. IMPRESSION: 1. No fracture. 2. Atheromatous arterial calcifications. Electronically Signed   By: Claudie Revering M.D.   On: 12/18/2020 13:53   DG Knee Complete 4 Views Right  Result Date: 12/18/2020 CLINICAL DATA:  Right knee pain following a fall today. EXAM: RIGHT KNEE - COMPLETE 4+ VIEW COMPARISON:  None. FINDINGS: Diffuse osteopenia. Mild medial joint space narrowing. No fracture, no fracture or dislocation. Small effusion. Mild diffuse soft tissue swelling.  Atheromatous arterial calcifications. IMPRESSION: 1. Small effusion. 2. No fracture or dislocation seen. 3. Mild medial joint space narrowing. 4. Atheromatous arterial calcifications. Electronically Signed   By: Claudie Revering M.D.   On: 12/18/2020 13:55    Procedures Procedures   Medications Ordered in ED Medications - No data to display  ED Course  I have reviewed the triage vital signs and the nursing notes.  Pertinent labs & imaging results that were available during my care of the patient were reviewed by me and considered in my medical decision making (see chart for details).    MDM Rules/Calculators/A&P                          Kimberly [name] was evaluated in Emergency Department on 12/18/2020 for the symptoms described in the history of present illness. She was evaluated in the context of the global COVID-19 pandemic, which necessitated consideration that the patient might be at risk for infection with the SARS-CoV-2 virus that causes COVID-19. Institutional protocols and algorithms that pertain to the evaluation of patients at risk for COVID-19 are in a state of rapid change based on information released by regulatory bodies including the CDC and federal and state organizations. These policies and algorithms were followed during the patient's care in the ED.  I personally reviewed patient's medical chart and all notes from triage and staff during today's encounter. I have also ordered and reviewed all labs and imaging that I felt to be medically necessary in the evaluation of this patient's complaints and with consideration of their physical exam. If needed, translation services were available and utilized.   Patient in the ED as fall on blood thinners.  She describes a  ground-level fall in which she fell backward, precipitated by lightheadedness and unsteadiness.  Daughter states that the last time this happened she was found to have had a stroke.  Her only neuro complaint at this time  however is floaters in her vision.  Denies any blurred vision or diplopia.  Endorses continued left-sided weakness and diminished sensation, chronic from prior stroke.  No new changes in strength or sensation.  Will order basic trauma labs as well as CK given time on ground.  Hold off on troponin given lack of chest pain.  Plain films that already been ordered and triage, will add additional imaging of chest, pelvis, tib-fib, as well as CT imaging of cervical spine and head.  Initial imaging of right knee, left knee, and right ankle are personally reviewed and without any obvious fractures/dislocation.  I emphasized the patient that this cannot exclude ligamentous or tendinous injury and that she may ultimately warrant further imaging with MRI on outpatient basis.  At shift change care was transferred to Domenic Moras, PA-C who will follow pending studies, re-evaluate, and determine disposition.     Final Clinical Impression(s) / ED Diagnoses Final diagnoses:  Fall, initial encounter    Rx / DC Orders ED Discharge Orders    None       Corena Herter, PA-C 12/18/20 1512    Charlesetta Shanks, MD 12/19/20 [phone]

## 2020-12-18 NOTE — ED Notes (Signed)
Attempted to call report x2

## 2020-12-18 NOTE — Progress Notes (Signed)
[place] Code Sepsis completion note:  Pt had BC drawn, received ABX and had IVF. First LA was 4.5. ED RN was contacted to repeat a second. Awaiting results. Being admitted to tele/medical unit for further monitoring.   Kimberly Ice Ibrahima Holberg DNP Elink RN [phone] PM

## 2020-12-18 NOTE — ED Notes (Signed)
Attempted to call report x 1  

## 2020-12-18 NOTE — Progress Notes (Signed)
Pharmacy Antibiotic Note  YARIXA LIGHTCAP is a 65 y.o. female admitted on 12/18/2020 presenting with fall, concern for sepsis.  Pharmacy has been consulted for vancomycin and cefepime dosing.  Plan: Vancomycin 1000 mg IV x 1, then 500 mg IV q12h (eAUC 512, Goal AUC 400-550, SCr 0.92) Cefepime 2g IV every 12 hours Monitor renal function, Cx and clinical progression to narrow Vancomycin levels as needed     Temp (24hrs), Avg:98.3 F (36.8 C), Min:98.3 F (36.8 C), Max:98.3 F (36.8 C)  Recent Labs  Lab 12/18/20 1320 12/18/20 1435  WBC  --  13.7*  CREATININE  --  0.92  LATICACIDVEN 4.5*  --     CrCl cannot be calculated (Unknown ideal weight.).    Allergies  Allergen Reactions  . Clonidine Derivatives Other (See Comments)    Patch causes scars  . Spironolactone Rash    Bertis Ruddy, PharmD Clinical Pharmacist ED Pharmacist Phone # [phone] 12/18/2020 4:36 PM

## 2020-12-18 NOTE — ED Triage Notes (Addendum)
Patient arrived by [facility] from home after falling this am. Patient complains of right ankle/ foot pain and complains of buring to abrasions on left foot and right knee. Alert and oriented. Has left sided weakness from previous stroke. Positive right pedal pulse with doppler, normal sensation and movement of toes

## 2020-12-18 NOTE — ED Provider Notes (Signed)
MSE was initiated and I personally evaluated the patient and placed orders (if any) at  12:53 PM on Dec 18, 2020.  The patient appears stable so that the remainder of the MSE may be completed by another provider.   Chief Complaint: falling     HPI:   Patient complains of falling this morning while trying to go to the bathroom.No syncope, did not hit her head.  Patient states that she is primarily right ankle and foot pain and bilateral knee pain. On Eliquis.   ROS: arthralgias    Physical Exam:  Gen:                Awake, no distress  HEENT:          Atraumatic  Resp:              Normal effort  Cardiac:          Normal rate  Abd:                Nondistended, nontender  MSK:              R ankle deformed, no open fracture, pulse on doppler. Abrasions to bilateral knees and foot. Neuro:            Speech clear,EOMS intact      Initiation of care has begun. The patient has been counseled on the process, plan, and necessity for staying for the completion/evaluation, and the remainder of the medical screening examination    Alfredia Client, PA-C 12/18/20 1259    Daleen Bo, MD 12/18/20 2034

## 2020-12-18 NOTE — ED Notes (Signed)
Patient transported to X-ray 

## 2020-12-19 ENCOUNTER — Inpatient Hospital Stay (HOSPITAL_COMMUNITY): Payer: Medicaid Other

## 2020-12-19 DIAGNOSIS — R6510 Systemic inflammatory response syndrome (SIRS) of non-infectious origin without acute organ dysfunction: Secondary | ICD-10-CM

## 2020-12-19 DIAGNOSIS — J9601 Acute respiratory failure with hypoxia: Secondary | ICD-10-CM

## 2020-12-19 DIAGNOSIS — I361 Nonrheumatic tricuspid (valve) insufficiency: Secondary | ICD-10-CM

## 2020-12-19 DIAGNOSIS — I340 Nonrheumatic mitral (valve) insufficiency: Secondary | ICD-10-CM | POA: Diagnosis not present

## 2020-12-19 LAB — URINE CULTURE: Culture: NO GROWTH

## 2020-12-19 LAB — COMPREHENSIVE METABOLIC PANEL
ALT: 44 U/L (ref 0–44)
AST: 33 U/L (ref 15–41)
Albumin: 3.2 g/dL — ABNORMAL LOW (ref 3.5–5.0)
Alkaline Phosphatase: 89 U/L (ref 38–126)
Anion gap: 7 (ref 5–15)
BUN: 15 mg/dL (ref 8–23)
CO2: 20 mmol/L — ABNORMAL LOW (ref 22–32)
Calcium: 8.8 mg/dL — ABNORMAL LOW (ref 8.9–10.3)
Chloride: 111 mmol/L (ref 98–111)
Creatinine, Ser: 0.86 mg/dL (ref 0.44–1.00)
GFR, Estimated: >60 mL/min (ref >60)
Glucose, Bld: 111 mg/dL — ABNORMAL HIGH (ref 70–99)
Potassium: 3.1 mmol/L — ABNORMAL LOW (ref 3.5–5.1)
Sodium: 138 mmol/L (ref 135–145)
Total Bilirubin: 0.7 mg/dL (ref 0.3–1.2)
Total Protein: 5.7 g/dL — ABNORMAL LOW (ref 6.5–8.1)

## 2020-12-19 LAB — CBC
HCT: 31.5 % — ABNORMAL LOW (ref 36.0–46.0)
Hemoglobin: 10.1 g/dL — ABNORMAL LOW (ref 12.0–15.0)
MCH: 22.3 pg — ABNORMAL LOW (ref 26.0–34.0)
MCHC: 32.1 g/dL (ref 30.0–36.0)
MCV: 69.7 fL — ABNORMAL LOW (ref 80.0–100.0)
Platelets: 191 10*3/uL (ref 150–400)
RBC: 4.52 MIL/uL (ref 3.87–5.11)
RDW: 16.3 % — ABNORMAL HIGH (ref 11.5–15.5)
WBC: 10.1 10*3/uL (ref 4.0–10.5)
nRBC: 0 % (ref 0.0–0.2)

## 2020-12-19 LAB — TROPONIN I (HIGH SENSITIVITY): Troponin I (High Sensitivity): 53 ng/L — ABNORMAL HIGH (ref <18)

## 2020-12-19 LAB — ECHOCARDIOGRAM LIMITED
Calc EF: 54.1 %
Height: 67 in
S' Lateral: 3.8 cm
Single Plane A2C EF: 58.4 %
Single Plane A4C EF: 54.6 %
Weight: 2080 oz

## 2020-12-19 LAB — BRAIN NATRIURETIC PEPTIDE: B Natriuretic Peptide: 1460.4 pg/mL — ABNORMAL HIGH (ref 0.0–100.0)

## 2020-12-19 LAB — MAGNESIUM: Magnesium: 1.6 mg/dL — ABNORMAL LOW (ref 1.7–2.4)

## 2020-12-19 LAB — LACTIC ACID, PLASMA: Lactic Acid, Venous: 1.6 mmol/L (ref 0.5–1.9)

## 2020-12-19 LAB — PHOSPHORUS: Phosphorus: 2.2 mg/dL — ABNORMAL LOW (ref 2.5–4.6)

## 2020-12-19 MED ORDER — SENNOSIDES-DOCUSATE SODIUM 8.6-50 MG PO TABS
1.0000 | ORAL_TABLET | Freq: Every evening | ORAL | Status: DC | PRN
  Administered 2020-12-20 – 2020-12-22 (×3): 1 via ORAL
  Filled 2020-12-19 (×3): qty 1

## 2020-12-19 MED ORDER — CARVEDILOL 6.25 MG PO TABS
6.2500 mg | ORAL_TABLET | Freq: Two times a day (BID) | ORAL | Status: DC
  Administered 2020-12-19 – 2020-12-25 (×13): 6.25 mg via ORAL
  Filled 2020-12-19 (×13): qty 1

## 2020-12-19 MED ORDER — POTASSIUM CHLORIDE 20 MEQ PO PACK
40.0000 meq | PACK | Freq: Once | ORAL | Status: AC
  Administered 2020-12-19: 40 meq via ORAL
  Filled 2020-12-19: qty 2

## 2020-12-19 MED ORDER — ACETAMINOPHEN 325 MG PO TABS
650.0000 mg | ORAL_TABLET | Freq: Four times a day (QID) | ORAL | Status: DC | PRN
  Administered 2020-12-19 – 2020-12-25 (×5): 650 mg via ORAL
  Filled 2020-12-19 (×5): qty 2

## 2020-12-19 MED ORDER — MAGNESIUM SULFATE 2 GM/50ML IV SOLN
2.0000 g | Freq: Once | INTRAVENOUS | Status: AC
  Administered 2020-12-19: 2 g via INTRAVENOUS
  Filled 2020-12-19: qty 50

## 2020-12-19 MED ORDER — POLYETHYLENE GLYCOL 3350 17 G PO PACK
17.0000 g | PACK | Freq: Every day | ORAL | Status: DC | PRN
  Administered 2020-12-20: 17 g via ORAL
  Filled 2020-12-19: qty 1

## 2020-12-19 NOTE — Progress Notes (Signed)
  Echocardiogram 2D Echocardiogram has been performed.  Kimberly [name] 12/19/2020, 8:56 AM

## 2020-12-19 NOTE — Progress Notes (Signed)
Repeat LA 3.4. Monitor on unit.

## 2020-12-19 NOTE — Progress Notes (Signed)
D/W attending Dr. Candiss Norse. Patient with known CAD s/p CABG, HCM, AFib admitted with sepsis and hypoxemia, improved. Echo shows normal EF and some increased prominence of pre-existing wall motion abnormalities. ECG shows pre-existing prominent repolarization changes due to LVH. Minimal abnormality in troponin, adynamic. These can be explained by her intercurrent illnes. Will arrange outpatient follow up.

## 2020-12-19 NOTE — Evaluation (Signed)
Physical Therapy Evaluation Patient Details Name: Kimberly [name] MRN: [id number] DOB: 11/06/1955 Today's Date: 12/19/2020   History of Present Illness  Pt presents to ED after near syncopal event with a fall at home. She reports intermittent lightheadedness with taking meds and a toothache recently. Has carpet burns on knees from crawling to phone for 911. PMH: paroxysmal A. fib on Eliquis, prior CVA x2, ascending aortic aneurysm, dilatation of infrarenal abdominal aorta, essential hypertension, hyperlipidemia, chronic anxiety/depression, GERD, tobacco use disorder, physical debility.  Clinical Impression  Pt admitted with above diagnosis. Pt presents with very limited mobility due to pain, crying out with any mvmt and when any part of her body is touched. Her R ankle, which is swollen, and her R toes are especially tender. Splint removed from pt's R ankle end of session and LE elevated with ice applied and pt encouraged to perform ROM to ankle and toes for edema control. Current splint not a walking splint so she will need something else if she can progress to [place]. Pt required max A to come to EOB and to return to supine and was unable to tolerate any wt RLE to stand. Will need 24 hr assist at d/c.  Pt currently with functional limitations due to the deficits listed below (see PT Problem List). Pt will benefit from skilled PT to increase their independence and safety with mobility to allow discharge to the venue listed below.      Follow Up Recommendations SNF;Supervision/Assistance - 24 hour    Equipment Recommendations  None recommended by PT    Recommendations for Other Services OT consult (pt requests to speak to CSW)     Precautions / Restrictions Precautions Precautions: Fall Restrictions Weight Bearing Restrictions: No Other Position/Activity Restrictions: no restrictions listed in chart but pt with sprained L ankle      Mobility  Bed Mobility Overal bed mobility: Needs  Assistance Bed Mobility: Supine to Sit;Sit to Supine;Rolling Rolling: Max assist;+2 for physical assistance   Supine to sit: Max assist Sit to supine: Max assist;+2 for physical assistance   General bed mobility comments: pt bed soaked so rolled end of session to replace linens, required max A +2 due to pt pain. Pt needed max A for LE's off EOB and mod HHA for elevation of trunk into sitting    Transfers                 General transfer comment: unable to tolerate weight through RLE today and cannot stand on LLE only due to weakness from CVA  Ambulation/Gait             General Gait Details: unable  Stairs            Wheelchair Mobility    Modified Rankin (Stroke Patients Only)       Balance Overall balance assessment: Needs assistance;History of Falls Sitting-balance support: No upper extremity supported;Feet supported Sitting balance-Leahy Scale: Fair Sitting balance - Comments: able to maintain static sitting with supervision                                     Pertinent Vitals/Pain Pain Assessment: Faces Faces Pain Scale: Hurts whole lot Pain Location: R ankle, L toes, R knee, and generalized Pain Descriptors / Indicators: Aching;Burning;Sore Pain Intervention(s): Limited activity within patient's tolerance;Monitored during session;Patient requesting pain meds-RN notified;Ice applied;Relaxation;Repositioned    Home Living Family/patient expects to be discharged  to:: Private residence Living Arrangements: Alone Available Help at Discharge: Personal care attendant;Family Type of Home: Apartment Home Access: Stairs to enter Entrance Stairs-Rails: None Entrance Stairs-Number of Steps: 3 Home Layout: One level Home Equipment: Walker - 2 wheels;Shower seat;Wheelchair - manual Additional Comments: sister helps get meds. Caregiver comes 7 days/wk 2.5 hrs/day. Daughter lives in [place]    Prior Function Level of Independence: Needs  assistance   Gait / Transfers Assistance Needed: 10-20' with RW into the bathroom, w/c for longer distances  ADL's / Homemaking Assistance Needed: caregiver gives meds and fixes breakfast and lunch. Assists with shower. Sometimes needs help with dressing.        Hand Dominance   Dominant Hand: Right    Extremity/Trunk Assessment   Upper Extremity Assessment Upper Extremity Assessment: Defer to OT evaluation;LUE deficits/detail LUE Deficits / Details: Minimal use of LUE, would benefit from splint for L hand and states that she has never had one    Lower Extremity Assessment Lower Extremity Assessment: RLE deficits/detail;LLE deficits/detail RLE Deficits / Details: abrasion R knee. Hip flex 2/5. Knee ext <3/5 due to LE pain from ankle. L ankle swollen and pt in a splint but not one that is safe to stand in. Today could not tolerate any wt through LLE RLE: Unable to fully assess due to pain RLE Sensation:  (hypersensitive) RLE Coordination: decreased gross motor LLE Deficits / Details: prior CVA, hip flex <3/5, knee ext <3/5, genu valgus noted in supine. Abrasions on toes. LLE: Unable to fully assess due to pain LLE Sensation:  (hypersensitive) LLE Coordination: decreased gross motor    Cervical / Trunk Assessment Cervical / Trunk Assessment: Kyphotic  Communication   Communication: No difficulties  Cognition Arousal/Alertness: Awake/alert Behavior During Therapy: Anxious Overall Cognitive Status: No family/caregiver present to determine baseline cognitive functioning                                 General Comments: pt cries out with any touching and all mvmt. Can give an accurate picture of home and amt of help she has.      General Comments General comments (skin integrity, edema, etc.): splint removed from pt's R ankle once supine and encouraged her to move that ankle and toes to help with swelling. LE elevated and ice applied. Will need splint with grip on  bottom to stand in if she gets to the point she can tolerate that    Exercises General Exercises - Lower Extremity Ankle Circles/Pumps: AROM;Right;10 reps;Supine   Assessment/Plan    PT Assessment Patient needs continued PT services  PT Problem List Decreased strength;Decreased range of motion;Decreased activity tolerance;Decreased balance;Decreased mobility;Decreased coordination;Pain;Decreased skin integrity       PT Treatment Interventions DME instruction;Functional mobility training;Therapeutic activities;Therapeutic exercise;Balance training;Patient/family education    PT Goals (Current goals can be found in the Care Plan section)  Acute Rehab PT Goals Patient Stated Goal: agreeable to rehab PT Goal Formulation: With patient Time For Goal Achievement: 01/02/21 Potential to Achieve Goals: Fair    Frequency Min 2X/week   Barriers to discharge Decreased caregiver support caregiver only 2.5 hrs/day    Co-evaluation               AM-PAC PT "6 Clicks" Mobility  Outcome Measure Help needed turning from your back to your side while in a flat bed without using bedrails?: Total Help needed moving from lying on your back to sitting  on the side of a flat bed without using bedrails?: Total Help needed moving to and from a bed to a chair (including a wheelchair)?: Total Help needed standing up from a chair using your arms (e.g., wheelchair or bedside chair)?: Total Help needed to walk in hospital room?: Total Help needed climbing 3-5 steps with a railing? : Total 6 Click Score: 6    End of Session Equipment Utilized During Treatment: Oxygen Activity Tolerance: Patient limited by pain Patient left: in bed;with call bell/phone within reach;with bed alarm set Nurse Communication: Mobility status PT Visit Diagnosis: History of falling (Z91.81);Muscle weakness (generalized) (M62.81);Pain Pain - part of body: Knee;Ankle and joints of foot    Time: 1021-1102 PT Time  Calculation (min) (ACUTE ONLY): 41 min   Charges:   PT Evaluation $PT Eval Moderate Complexity: 1 Mod PT Treatments $Therapeutic Activity: 23-37 mins        Leighton Roach, PT  Acute Rehab Services  Pager [phone] Office [place] 12/19/2020, 12:21 PM

## 2020-12-19 NOTE — Progress Notes (Signed)
EKG CRITICAL VALUE     12 lead EKG performed.  Critical value noted. Rico Sheehan, RN notified.   Radene Gunning, CCT 12/19/2020 7:39 AM

## 2020-12-19 NOTE — Progress Notes (Signed)
PROGRESS NOTE                                                                                                                                                                                                             Patient Demographics:    Kimberly [name], is a 65 y.o. female, DOB - 06/17/1956, [id number]  Outpatient Primary MD for the patient is [name], FNP    LOS - 1  Admit date - 12/18/2020    No chief complaint on file.      Brief Narrative (HPI from H&P) - Kimberly [name] is a 65 y.o. female with medical history significant for paroxysmal A. fib on Eliquis, prior CVA x2, ascending aortic aneurysm, dilatation of infrarenal abdominal aorta, essential hypertension, hyperlipidemia, chronic anxiety/depression, GERD, tobacco use disorder, physical debility, recently prescribed antibiotics, penicillin on 12/17/2020 for dental caries, who presented to [facility] ED from home after a near syncope event and a fall at home, in the ER she was diagnosed with dehydration, hypotension there was question of sepsis of unclear origin and were admitted to the hospital.   Subjective:    Kimberly [name] today has, No headache, No chest pain, No abdominal pain - No Nausea, No new weakness tingling or numbness, no SOB.   Assessment  & Plan :      1. Near syncope in the setting of dehydration, possible sepsis - she was recently treated for dental caries and UTI, does not appear toxic, has been started on vancomycin and cefepime upon admission and we will continue until her cultures are negative, sepsis pathophysiology has resolved, hydrate with IV fluids and monitor final cultures and trend procalcitonin.  We will also get PT OT involved and advance activity as tolerated.  Note patient has reported history of paroxysmal dizziness.  Continue home dose meclizine.  2.  Acute hypoxic respiratory failure - resolved currently symptom-free,  postoperatively to atelectasis we will continue to monitor closely.  3.  History of CAD, paroxysmal A. fib, CHA2DS2-VASc 2 score of greater than 4.  She is chest pain-free.  Her troponin rise is in non-ACS pattern, echo noted with EF 55% and possibly old wall motion abnormalities.  Case discussed with cardiologist Dr. Adalberto Ill will evaluate.  Most likely outpatient follow-up and further work-up if needed.  Continue combination of Eliquis, statin and Coreg for  now.  4.  History of CVA with chronic left-sided hemiparesis.  Continue Eliquis and statin for secondary prevention.  5.  Bilateral foot edema.  Likely third spacing.  Had some nonspecific soft tissue discomfort, x-rays are stable, will monitor with PT and advancing activity.   6.  History of smoking and COPD.  Stable supportive care counseled to quit smoking.  7.  Chronic microcytic anemia.  Monitor outpatient follow-up with PCP.  8.  History of ascending aortic aneurysm.  Outpatient cardiology follow-up.  9.  Lung nodule noted on CT chest.  She is a smoker we will have her follow-up with pulmonary outpatient for repeat CT in 12 months.  10.  Moderate pulmonary artery hypertension.  Supportive care outpatient pulmonary follow-up.       Condition -  Guarded  Family Communication  :  None present  Code Status :  Full  Consults  :  None  PUD Prophylaxis : PPI   Procedures  :     TTE - 1. Left ventricular ejection fraction, by estimation, is 55%. Left ventricular ejection fraction by 3D volume is 52 %. The left ventricle has normal function. The left ventricle demonstrates regional wall motion abnormalities (see scoring diagram/findings for description). There is severe asymmetric left ventricular hypertrophy of the apical segment.  2. Right ventricular systolic function is mildly reduced. The right ventricular size is mildly enlarged. There is moderately elevated pulmonary artery systolic pressure. The estimated right ventricular  systolic pressure is 62.8 mmHg.  3. Left atrial size was severely dilated.  4. Right atrial size was moderately dilated.  5. The mitral valve is grossly normal. Mild mitral valve regurgitation.  6. Tricuspid valve regurgitation is moderate.  7. The aortic valve is grossly normal. Aortic valve regurgitation is not visualized.  8. Aortic dilatation noted. There is mild dilatation of the ascending aorta, measuring 44 mm.  9. The inferior vena cava is dilated in size with <50% respiratory variability, suggesting right atrial pressure of 15 mmHg. Comparison(s): A prior study was performed on 08/19/20. Prior images reviewed side by side. LVEF may be slightly decreased compared to prior, regional wall motion abnormalities may have been present on prior study, but more prominent today.   CT Head & C Spine - Non acute   CT Chest -  1. Mild to moderate severity bilateral lower lobe linear scarring and/or atelectasis with a small noncalcified nodular component within the left lung base. No follow-up needed if patient is low-risk. Non-contrast chest CT can be considered in 12 months if patient is high-risk.      Disposition Plan  :    Status is: Inpatient  Remains inpatient appropriate because:IV treatments appropriate due to intensity of illness or inability to take PO  Dispo:  Patient From: Home  Planned Disposition: Home  Medically stable for discharge: No    DVT Prophylaxis  :    apixaban (ELIQUIS) tablet 5 mg     Lab Results  Component Value Date   PLT 191 12/19/2020    Diet :  Diet Order            Diet Heart Room service appropriate? Yes; Fluid consistency: Thin  Diet effective now                  Inpatient Medications  Scheduled Meds: . apixaban  5 mg Oral BID  . atorvastatin  80 mg Oral Daily  . carvedilol  6.25 mg Oral BID WC  . ferrous sulfate  325 mg Oral Q breakfast  . mirtazapine  15 mg Oral QHS  . nicotine  14 mg Transdermal Daily  . pantoprazole  40 mg Oral Daily    Continuous Infusions: . ceFEPime (MAXIPIME) IV 2 g (12/19/20 1120)  . lactated ringers 50 mL/hr at 12/18/20 2331  . vancomycin 500 mg (12/19/20 0522)   PRN Meds:.acetaminophen, ipratropium-albuterol, meclizine  Antibiotics  :    Anti-infectives (From admission, onward)   Start     Dose/Rate Route Frequency Ordered Stop   12/19/20 1000  ceFEPIme (MAXIPIME) 2 g in sodium chloride 0.9 % 100 mL IVPB        2 g 200 mL/hr over 30 Minutes Intravenous Every 12 hours 12/18/20 1638     12/19/20 0600  vancomycin (VANCOREADY) IVPB 500 mg/100 mL        500 mg 100 mL/hr over 60 Minutes Intravenous Every 12 hours 12/18/20 1638     12/18/20 1630  ceFEPIme (MAXIPIME) 2 g in sodium chloride 0.9 % 100 mL IVPB        2 g 200 mL/hr over 30 Minutes Intravenous  Once 12/18/20 1629 12/18/20 1947   12/18/20 1630  metroNIDAZOLE (FLAGYL) IVPB 500 mg        500 mg 100 mL/hr over 60 Minutes Intravenous  Once 12/18/20 1629 12/18/20 2138   12/18/20 1630  vancomycin (VANCOREADY) IVPB 1000 mg/200 mL        1,000 mg 200 mL/hr over 60 Minutes Intravenous  Once 12/18/20 1629 12/18/20 2321       Time Spent in minutes  30   Lala Lund M.D on 12/19/2020 at 1:19 PM  To page go to [email or web address]   [facility] -  Office  [phone]    See all Orders from today for further details    Objective:   Vitals:   12/18/20 2217 12/18/20 2300 12/19/20 0513 12/19/20 0820  BP:  (!) 161/105 (!) 136/100 (!) 133/97  Pulse:  79 85 73  Resp:  18 20 15   Temp: 98.3 F (36.8 C) 98.7 F (37.1 C) 98.7 F (37.1 C) 99.5 F (37.5 C)  TempSrc: Oral Oral Oral Oral  SpO2:  96% 95% 99%  Weight:      Height:        Wt Readings from Last 3 Encounters:  12/18/20 59 kg  10/14/20 54.6 kg  09/30/20 53.5 kg     Intake/Output Summary (Last 24 hours) at 12/19/2020 1319 Last data filed at 12/18/2020 2228 Gross per 24 hour  Intake 1750 ml  Output --  Net 1750 ml     Physical Exam  Awake Alert, chronic  left-sided hemiparesis,  R Foot in Proform boot, some L. Foot swelling. [place].AT,PERRAL Supple Neck,No JVD, No cervical lymphadenopathy appriciated.  Symmetrical Chest wall movement, Good air movement bilaterally, CTAB RRR,No Gallops,Rubs or new Murmurs, No Parasternal Heave +ve B.Sounds, Abd Soft, No tenderness, No organomegaly appriciated, No rebound - guarding or rigidity. No Cyanosis, Clubbing or edema,   Data Review:    CBC Recent Labs  Lab 12/18/20 1435 12/19/20 0328  WBC 13.7* 10.1  HGB 11.4* 10.1*  HCT 36.8 31.5*  PLT 229 191  MCV 71.0* 69.7*  MCH 22.0* 22.3*  MCHC 31.0 32.1  RDW 16.6* 16.3*  LYMPHSABS 1.8  --   MONOABS 0.7  --   EOSABS 0.0  --   BASOSABS 0.0  --     Recent Labs  Lab 12/18/20 1320 12/18/20 1430 12/18/20 1435 12/18/20 2232 12/19/20 0328  NA  --   --  142  --  138  K  --   --  3.6  --  3.1*  CL  --   --  111  --  111  CO2  --   --  21*  --  20*  GLUCOSE  --   --  107*  --  111*  BUN  --   --  21  --  15  CREATININE  --   --  0.92  --  0.86  CALCIUM  --   --  9.6  --  8.8*  AST  --   --  48*  --  33  ALT  --   --  57*  --  44  ALKPHOS  --   --  116  --  89  BILITOT  --   --  0.7  --  0.7  ALBUMIN  --   --  3.9  --  3.2*  MG  --   --   --   --  1.6*  LATICACIDVEN 4.5*  --   --  3.4* 1.6  INR  --  1.6*  --   --   --   BNP  --   --   --   --  1,460.4*    ------------------------------------------------------------------------------------------------------------------ No results for input(s): CHOL, HDL, LDLCALC, TRIG, CHOLHDL, LDLDIRECT in the last 72 hours.  Lab Results  Component Value Date   HGBA1C 5.5 09/02/2019   ------------------------------------------------------------------------------------------------------------------ No results for input(s): TSH, T4TOTAL, T3FREE, THYROIDAB in the last 72 hours.  Invalid input(s): FREET3  Cardiac Enzymes Recent Labs  Lab 12/18/20 1435  CKMB 7.9*    ------------------------------------------------------------------------------------------------------------------    Component Value Date/Time   BNP 1,460.4 (H) 12/19/2020 0328    Micro Results Recent Results (from the past 240 hour(s))  Urine culture     Status: None   Collection Time: 12/18/20  6:07 PM   Specimen: In/Out Cath Urine  Result Value Ref Range Status   Specimen Description IN/OUT CATH URINE  Final   Special Requests NONE  Final   Culture   Final    NO GROWTH Performed at [facility], [address], [place], [place] [address]    Report Status 12/19/2020 FINAL  Final  Resp Panel by RT-PCR (Flu A&B, Covid) Nasopharyngeal Swab     Status: None   Collection Time: 12/18/20  7:59 PM   Specimen: Nasopharyngeal Swab; Nasopharyngeal(NP) swabs in vial transport medium  Result Value Ref Range Status   SARS Coronavirus 2 by RT PCR NEGATIVE NEGATIVE Final    Comment: (NOTE) SARS-CoV-2 target nucleic acids are NOT DETECTED.  The SARS-CoV-2 RNA is generally detectable in upper respiratory specimens during the acute phase of infection. The lowest concentration of SARS-CoV-2 viral copies this assay can detect is 138 copies/mL. A negative result does not preclude SARS-Cov-2 infection and should not be used as the sole basis for treatment or other patient management decisions. A negative result may occur with  improper specimen collection/handling, submission of specimen other than nasopharyngeal swab, presence of viral mutation(s) within the areas targeted by this assay, and inadequate number of viral copies(<138 copies/mL). A negative result must be combined with clinical observations, patient history, and epidemiological information. The expected result is Negative.  Fact Sheet for Patients:  EntrepreneurPulse.com.au  Fact Sheet for Healthcare Providers:  IncredibleEmployment.be  This test is no t yet approved or cleared by  the [place] and  has been authorized for detection and/or diagnosis of SARS-CoV-2 by FDA under an Emergency Use Authorization (EUA). This EUA will remain  in effect (meaning this test can be used) for the duration of the COVID-19 declaration under Section 564(b)(1) of the Act, 21 U.S.C.section 360bbb-3(b)(1), unless the authorization is terminated  or revoked sooner.       Influenza A by PCR NEGATIVE NEGATIVE Final   Influenza B by PCR NEGATIVE NEGATIVE Final    Comment: (NOTE) The Xpert Xpress SARS-CoV-2/FLU/RSV plus assay is intended as an aid in the diagnosis of influenza from Nasopharyngeal swab specimens and should not be used as a sole basis for treatment. Nasal washings and aspirates are unacceptable for Xpert Xpress SARS-CoV-2/FLU/RSV testing.  Fact Sheet for Patients: EntrepreneurPulse.com.au  Fact Sheet for Healthcare Providers: IncredibleEmployment.be  This test is not yet approved or cleared by the [place] FDA and has been authorized for detection and/or diagnosis of SARS-CoV-2 by FDA under an Emergency Use Authorization (EUA). This EUA will remain in effect (meaning this test can be used) for the duration of the COVID-19 declaration under Section 564(b)(1) of the Act, 21 U.S.C. section 360bbb-3(b)(1), unless the authorization is terminated or revoked.  Performed at [facility], [place] [address], [place], [place] [address]     Radiology Reports DG Tibia/Fibula Right  Result Date: 12/18/2020 CLINICAL DATA:  Fall with right lower leg pain. EXAM: RIGHT TIBIA AND FIBULA - 2 VIEW COMPARISON:  Knee and ankle radiographs same date. FINDINGS: The bones are demineralized. No evidence of acute fracture or dislocation. Mild medial compartment joint space narrowing at the knee, scattered vascular calcifications and diffuse soft tissue edema, greatest distally, are again noted. IMPRESSION: No acute osseous findings  identified within the right lower leg. Diffuse soft tissue edema/swelling. Electronically Signed   By: Richardean Sale M.D.   On: 12/18/2020 15:16   DG Ankle Complete Left  Result Date: 12/19/2020 CLINICAL DATA:  65 year old female status post fall with severe pain. EXAM: LEFT ANKLE COMPLETE - 3+ VIEW COMPARISON:  Left foot series today. FINDINGS: Soft tissue swelling appears greater on the medial aspect of the ankle. No joint effusion on cross-table lateral view. Preserved mortise joint alignment. Talar dome intact. No acute osseous abnormality identified. IMPRESSION: Soft tissue swelling with no acute fracture or dislocation identified about the left ankle. Electronically Signed   By: Genevie Ann M.D.   On: 12/19/2020 10:02   DG Ankle Complete Right  Result Date: 12/18/2020 CLINICAL DATA:  Fall EXAM: RIGHT ANKLE - COMPLETE 3+ VIEW COMPARISON:  None. FINDINGS: The bones appear diffusely demineralized. No evidence of acute fracture or malalignment. Generalized soft tissue swelling present about the ankle and lower leg. Trace atherosclerotic vascular calcifications. IMPRESSION: Soft tissue swelling about the ankle and lower leg without evidence of acute fracture or malalignment. The bones appear demineralized suggesting underlying osteoporosis. Electronically Signed   By: Jacqulynn Cadet M.D.   On: 12/18/2020 13:52   CT Head Wo Contrast  Result Date: 12/18/2020 CLINICAL DATA:  [place] this morning. Left-sided weakness from a previous stroke. Taking blood thinners. EXAM: CT HEAD WITHOUT CONTRAST CT CERVICAL SPINE WITHOUT CONTRAST TECHNIQUE: Multidetector CT imaging of the head and cervical spine was performed following the standard protocol without intravenous contrast. Multiplanar CT image reconstructions of the cervical spine were also generated. COMPARISON:  Head CT dated 04/25/2020. Cervical spine CT dated 02/13/2018. FINDINGS: CT HEAD FINDINGS Brain: Stable old right occipital lobe infarct. Stable old right  thalamic lacunar infarct.  Stable mildly enlarged ventricles and cortical sulci. Mild-to-moderate patchy white matter low density in both cerebral hemispheres without significant change. No intracranial hemorrhage, mass lesion or CT evidence of acute infarction. Vascular: Dense bilateral cavernous carotid atheromatous calcifications. Skull: Normal. Negative for fracture or focal lesion. Sinuses/Orbits: Unremarkable. Other: None. CT CERVICAL SPINE FINDINGS Alignment: Mild reversal of the normal cervical lordosis without significant change. No subluxations. Skull base and vertebrae: No acute fracture. No primary bone lesion or focal pathologic process. Soft tissues and spinal canal: No prevertebral fluid or swelling. No visible canal hematoma. Disc levels:  Multilevel degenerative changes with little change. Upper chest: Mild biapical bullous changes. Other: Dense bilateral carotid artery calcifications. IMPRESSION: 1. No skull fracture or intracranial hemorrhage. 2. No cervical spine fracture or subluxation. 3. Stable cerebral atrophy, chronic small vessel white matter ischemic changes, old right occipital lobe infarct and old right thalamic lacunar infarct. 4. Stable cervical spine degenerative changes. Electronically Signed   By: Claudie Revering M.D.   On: 12/18/2020 16:06   CT CHEST WO CONTRAST  Result Date: 12/18/2020 CLINICAL DATA:  Possible sepsis. EXAM: CT CHEST WITHOUT CONTRAST TECHNIQUE: Multidetector CT imaging of the chest was performed following the standard protocol without IV contrast. COMPARISON:  September 21, 2019 FINDINGS: Cardiovascular: There is marked severity calcification of the aortic arch and descending thoracic aorta. The ascending thoracic aorta measures approximately 4.8 cm x 4.2 cm. Stable pulmonary artery enlargement is seen. There is moderate severity cardiomegaly with marked severity coronary artery calcification. No pericardial effusion. Mediastinum/Nodes: No enlarged mediastinal or  axillary lymph nodes. Thyroid gland, trachea, and esophagus demonstrate no significant findings. Lungs/Pleura: Mild to moderate severity areas of scarring and/or linear atelectasis are seen within the bilateral lower lobes. A 5 mm noncalcified nodular component is seen within the posteromedial aspect of the left lung base (axial CT image 118, CT series number 4). This represents a new finding when compared to the prior exam. There is no evidence of a pleural effusion or pneumothorax. Upper Abdomen: No acute abnormality. Musculoskeletal: Multiple sternal wires are present. No acute osseous abnormalities are identified. IMPRESSION: 1. Mild to moderate severity bilateral lower lobe linear scarring and/or atelectasis with a small noncalcified nodular component within the left lung base. No follow-up needed if patient is low-risk. Non-contrast chest CT can be considered in 12 months if patient is high-risk. This recommendation follows the consensus statement: Guidelines for Management of Incidental Pulmonary Nodules Detected on CT Images: From the Fleischner Society 2017; Radiology 2017; 284:228-243. 2. 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 3. Additional stable findings suggestive of pulmonary arterial hypertension. Electronically Signed   By: Virgina Norfolk M.D.   On: 12/18/2020 18:39   CT Cervical Spine Wo Contrast  Result Date: 12/18/2020 CLINICAL DATA:  [place] this morning. Left-sided weakness from a previous stroke. Taking blood thinners. EXAM: CT HEAD WITHOUT CONTRAST CT CERVICAL SPINE WITHOUT CONTRAST TECHNIQUE: Multidetector CT imaging of the head and cervical spine was performed following the standard protocol without intravenous contrast. Multiplanar CT image reconstructions of the cervical spine were also generated. COMPARISON:  Head CT dated 04/25/2020. Cervical spine CT dated 02/13/2018. FINDINGS: CT HEAD FINDINGS Brain: Stable old right occipital lobe infarct. Stable old right  thalamic lacunar infarct. Stable mildly enlarged ventricles and cortical sulci. Mild-to-moderate patchy white matter low density in both cerebral hemispheres without significant change. No intracranial hemorrhage, mass lesion or CT evidence of acute infarction. Vascular: Dense bilateral cavernous carotid atheromatous calcifications. Skull: Normal. Negative for fracture or  focal lesion. Sinuses/Orbits: Unremarkable. Other: None. CT CERVICAL SPINE FINDINGS Alignment: Mild reversal of the normal cervical lordosis without significant change. No subluxations. Skull base and vertebrae: No acute fracture. No primary bone lesion or focal pathologic process. Soft tissues and spinal canal: No prevertebral fluid or swelling. No visible canal hematoma. Disc levels:  Multilevel degenerative changes with little change. Upper chest: Mild biapical bullous changes. Other: Dense bilateral carotid artery calcifications. IMPRESSION: 1. No skull fracture or intracranial hemorrhage. 2. No cervical spine fracture or subluxation. 3. Stable cerebral atrophy, chronic small vessel white matter ischemic changes, old right occipital lobe infarct and old right thalamic lacunar infarct. 4. Stable cervical spine degenerative changes. Electronically Signed   By: Claudie Revering M.D.   On: 12/18/2020 16:06   DG Pelvis Portable  Result Date: 12/18/2020 CLINICAL DATA:  Patient fell. EXAM: PORTABLE PELVIS 1-2 VIEWS COMPARISON:  Pelvic CT 08/19/2020. FINDINGS: The bones appear mildly demineralized. No evidence of acute fracture, dislocation or femoral head avascular necrosis. The hip joint spaces are preserved. There are mild sacroiliac degenerative changes bilaterally. Diffuse vascular calcifications are noted. IMPRESSION: No acute osseous findings. Electronically Signed   By: Richardean Sale M.D.   On: 12/18/2020 15:12   DG Chest Port 1 View  Result Date: 12/18/2020 CLINICAL DATA:  [place].  Smoker. EXAM: PORTABLE CHEST 1 VIEW COMPARISON:  08/18/2020  FINDINGS: Stable enlarged cardiac silhouette and post CABG changes. The aorta remains tortuous and partially calcified. Clear lungs. The lungs remain mildly hyperexpanded. Unremarkable bones. IMPRESSION: 1. No acute abnormality. 2. Stable cardiomegaly and changes of COPD. Electronically Signed   By: Claudie Revering M.D.   On: 12/18/2020 15:12   DG Knee Complete 4 Views Left  Result Date: 12/18/2020 CLINICAL DATA:  Left knee pain following a fall today. EXAM: LEFT KNEE - COMPLETE 4+ VIEW COMPARISON:  03/24/2018 FINDINGS: The bones appear osteopenic. No fracture, dislocation or effusion. Atheromatous arterial calcifications. IMPRESSION: 1. No fracture. 2. Atheromatous arterial calcifications. Electronically Signed   By: Claudie Revering M.D.   On: 12/18/2020 13:53   DG Knee Complete 4 Views Right  Result Date: 12/18/2020 CLINICAL DATA:  Right knee pain following a fall today. EXAM: RIGHT KNEE - COMPLETE 4+ VIEW COMPARISON:  None. FINDINGS: Diffuse osteopenia. Mild medial joint space narrowing. No fracture, no fracture or dislocation. Small effusion. Mild diffuse soft tissue swelling. Atheromatous arterial calcifications. IMPRESSION: 1. Small effusion. 2. No fracture or dislocation seen. 3. Mild medial joint space narrowing. 4. Atheromatous arterial calcifications. Electronically Signed   By: Claudie Revering M.D.   On: 12/18/2020 13:55   DG Foot Complete Left  Result Date: 12/19/2020 CLINICAL DATA:  65 year old female status post fall with severe pain. EXAM: LEFT FOOT - COMPLETE 3+ VIEW COMPARISON:  Left foot series 02/09/2012. FINDINGS: Possible chronic fracture of the 5th proximal phalanx since 2013. But no acute osseous abnormality identified. Normal joint spaces and alignment in the left foot. Calcaneus appears intact on the cross-table lateral view. There is mild generalized soft tissue swelling. No soft tissue gas. IMPRESSION: No acute osseous abnormality identified about the left foot. Electronically Signed   By:  Genevie Ann M.D.   On: 12/19/2020 10:01   DG Foot Complete Right  Result Date: 12/18/2020 CLINICAL DATA:  Fall onto right side.  Ankle pain. EXAM: RIGHT FOOT COMPLETE - 3+ VIEW COMPARISON:  Ankle radiographs same date. FINDINGS: The bones are demineralized. There is no evidence of acute fracture or dislocation. Toe evaluation is limited by positioning on  the AP and oblique views. The toes appear unremarkable on the lateral view. No focal soft tissue swelling or foreign body identified in the foot. IMPRESSION: Osteopenia without acute osseous findings in the foot. Electronically Signed   By: Richardean Sale M.D.   On: 12/18/2020 15:14   ECHOCARDIOGRAM LIMITED  Result Date: 12/19/2020    ECHOCARDIOGRAM LIMITED REPORT   Patient Name:   Kimberly [name] Date of Exam: 12/19/2020 Medical Rec #:  [id number]         Height:       67.0 in Accession #:    [id number]        Weight:       130.0 lb Date of Birth:  1955-10-14        BSA:          1.684 m Patient Age:    36 years          BP:           136/100 mmHg Patient Gender: F                 HR:           73 bpm. Exam Location:  Inpatient Procedure: Limited Echo, 3D Echo, Cardiac Doppler and Color Doppler Indications:    I50.40* Unspecified combined systolic (congestive) and diastolic                 (congestive) heart failure  History:        Patient has prior history of Echocardiogram examinations, most                 recent 08/19/2020. Apical variant hypertrophic cardiomyopathy and                 CHF, CAD, Abnormal ECG, Stroke, Arrythmias:Atrial Fibrillation;                 Risk Factors:Hypertension and Current Smoker.  Sonographer:    Roseanna Rainbow RDCS Referring Phys: [id number] Margaree Mackintosh [place]  1. Left ventricular ejection fraction, by estimation, is 55%. Left ventricular ejection fraction by 3D volume is 52 %. The left ventricle has normal function. The left ventricle demonstrates regional wall motion abnormalities (see scoring diagram/findings for description).  There is severe asymmetric left ventricular hypertrophy of the apical segment.  2. Right ventricular systolic function is mildly reduced. The right ventricular size is mildly enlarged. There is moderately elevated pulmonary artery systolic pressure. The estimated right ventricular systolic pressure is 123456 mmHg.  3. Left atrial size was severely dilated.  4. Right atrial size was moderately dilated.  5. The mitral valve is grossly normal. Mild mitral valve regurgitation.  6. Tricuspid valve regurgitation is moderate.  7. The aortic valve is grossly normal. Aortic valve regurgitation is not visualized.  8. Aortic dilatation noted. There is mild dilatation of the ascending aorta, measuring 44 mm.  9. The inferior vena cava is dilated in size with <50% respiratory variability, suggesting right atrial pressure of 15 mmHg. Comparison(s): A prior study was performed on 08/19/20. Prior images reviewed side by side. LVEF may be slightly decreased compared to prior, regional wall motion abnormalities may have been present on prior study, but more prominent today. FINDINGS  Left Ventricle: Left ventricular ejection fraction, by estimation, is 55%. Left ventricular ejection fraction by 3D volume is 52 %. The left ventricle has normal function. The left ventricle demonstrates regional wall motion abnormalities. There is severe asymmetric left ventricular hypertrophy of the apical  segment.  LV Wall Scoring: The basal inferolateral segment is akinetic. The antero-lateral wall, mid inferolateral segment, and basal inferior segment are hypokinetic. Right Ventricle: The right ventricular size is mildly enlarged. Right ventricular systolic function is mildly reduced. There is moderately elevated pulmonary artery systolic pressure. The tricuspid regurgitant velocity is 3.32 m/s, and with an assumed right atrial pressure of 15 mmHg, the estimated right ventricular systolic pressure is 123456 mmHg. Left Atrium: Left atrial size was severely  dilated. Right Atrium: Right atrial size was moderately dilated. Mitral Valve: The mitral valve is grossly normal. Mild mitral valve regurgitation. Tricuspid Valve: Tricuspid valve regurgitation is moderate. Aortic Valve: The aortic valve is grossly normal. Aortic valve regurgitation is not visualized. Aorta: Aortic dilatation noted. There is mild dilatation of the ascending aorta, measuring 44 mm. Venous: The inferior vena cava is dilated in size with less than 50% respiratory variability, suggesting right atrial pressure of 15 mmHg. LEFT VENTRICLE PLAX 2D LVIDd:         4.40 cm LVIDs:         3.80 cm LV PW:         1.50 cm         3D Volume EF LV IVS:        1.10 cm         LV 3D EF:    Left LVOT diam:     1.80 cm                      ventricular LVOT Area:     2.54 cm                     ejection                                             fraction by                                             3D volume LV Volumes (MOD)                            is 52 %. LV vol d, MOD    53.4 ml A2C: LV vol d, MOD    46.9 ml       3D Volume EF: A4C:                           3D EF:        52 % LV vol s, MOD    22.2 ml A2C: LV vol s, MOD    21.3 ml A4C: LV SV MOD A2C:   31.2 ml LV SV MOD A4C:   46.9 ml LV SV MOD BP:    27.5 ml IVC IVC diam: 2.50 cm LEFT ATRIUM         Index LA diam:    2.50 cm 1.48 cm/m   AORTA Ao Root diam: 3.20 cm Ao Asc diam:  4.40 cm TRICUSPID VALVE TR Peak grad:   44.1 mmHg TR Vmax:        332.00 cm/s  SHUNTS Systemic Diam: 1.80 cm Cherlynn Kaiser MD Electronically signed  by Cherlynn Kaiser MD Signature Date/Time: 12/19/2020/11:38:29 AM    Final

## 2020-12-20 ENCOUNTER — Other Ambulatory Visit: Payer: Self-pay | Admitting: Nurse Practitioner

## 2020-12-20 DIAGNOSIS — I272 Pulmonary hypertension, unspecified: Secondary | ICD-10-CM

## 2020-12-20 DIAGNOSIS — E860 Dehydration: Secondary | ICD-10-CM

## 2020-12-20 DIAGNOSIS — R630 Anorexia: Secondary | ICD-10-CM

## 2020-12-20 DIAGNOSIS — D551 Anemia due to other disorders of glutathione metabolism: Secondary | ICD-10-CM

## 2020-12-20 DIAGNOSIS — F17210 Nicotine dependence, cigarettes, uncomplicated: Secondary | ICD-10-CM

## 2020-12-20 DIAGNOSIS — I2510 Atherosclerotic heart disease of native coronary artery without angina pectoris: Secondary | ICD-10-CM

## 2020-12-20 DIAGNOSIS — R911 Solitary pulmonary nodule: Secondary | ICD-10-CM

## 2020-12-20 LAB — CBC WITH DIFFERENTIAL/PLATELET
Abs Immature Granulocytes: 0.02 10*3/uL (ref 0.00–0.07)
Basophils Absolute: 0.1 10*3/uL (ref 0.0–0.1)
Basophils Relative: 1 %
Eosinophils Absolute: 0.3 10*3/uL (ref 0.0–0.5)
Eosinophils Relative: 3 %
HCT: 31.7 % — ABNORMAL LOW (ref 36.0–46.0)
Hemoglobin: 10 g/dL — ABNORMAL LOW (ref 12.0–15.0)
Immature Granulocytes: 0 %
Lymphocytes Relative: 24 %
Lymphs Abs: 1.8 10*3/uL (ref 0.7–4.0)
MCH: 22.1 pg — ABNORMAL LOW (ref 26.0–34.0)
MCHC: 31.5 g/dL (ref 30.0–36.0)
MCV: 70 fL — ABNORMAL LOW (ref 80.0–100.0)
Monocytes Absolute: 0.4 10*3/uL (ref 0.1–1.0)
Monocytes Relative: 5 %
Neutro Abs: 5 10*3/uL (ref 1.7–7.7)
Neutrophils Relative %: 67 %
Platelets: 179 10*3/uL (ref 150–400)
RBC: 4.53 MIL/uL (ref 3.87–5.11)
RDW: 16.5 % — ABNORMAL HIGH (ref 11.5–15.5)
WBC: 7.5 10*3/uL (ref 4.0–10.5)
nRBC: 0 % (ref 0.0–0.2)

## 2020-12-20 LAB — COMPREHENSIVE METABOLIC PANEL
ALT: 36 U/L (ref 0–44)
AST: 25 U/L (ref 15–41)
Albumin: 2.8 g/dL — ABNORMAL LOW (ref 3.5–5.0)
Alkaline Phosphatase: 93 U/L (ref 38–126)
Anion gap: 5 (ref 5–15)
BUN: 12 mg/dL (ref 8–23)
CO2: 22 mmol/L (ref 22–32)
Calcium: 8.7 mg/dL — ABNORMAL LOW (ref 8.9–10.3)
Chloride: 112 mmol/L — ABNORMAL HIGH (ref 98–111)
Creatinine, Ser: 0.8 mg/dL (ref 0.44–1.00)
GFR, Estimated: >60 mL/min (ref >60)
Glucose, Bld: 96 mg/dL (ref 70–99)
Potassium: 3.7 mmol/L (ref 3.5–5.1)
Sodium: 139 mmol/L (ref 135–145)
Total Bilirubin: 0.4 mg/dL (ref 0.3–1.2)
Total Protein: 5.5 g/dL — ABNORMAL LOW (ref 6.5–8.1)

## 2020-12-20 LAB — URIC ACID: Uric Acid, Serum: 5.2 mg/dL (ref 2.5–7.1)

## 2020-12-20 LAB — PROCALCITONIN: Procalcitonin: 2.67 ng/mL

## 2020-12-20 LAB — MAGNESIUM: Magnesium: 1.8 mg/dL (ref 1.7–2.4)

## 2020-12-20 LAB — BRAIN NATRIURETIC PEPTIDE: B Natriuretic Peptide: 705.5 pg/mL — ABNORMAL HIGH (ref 0.0–100.0)

## 2020-12-20 MED ORDER — METHYLPREDNISOLONE SODIUM SUCC 125 MG IJ SOLR
60.0000 mg | INTRAMUSCULAR | Status: AC
  Administered 2020-12-20: 60 mg via INTRAVENOUS
  Filled 2020-12-20: qty 2

## 2020-12-20 MED ORDER — TRAMADOL HCL 50 MG PO TABS
50.0000 mg | ORAL_TABLET | Freq: Four times a day (QID) | ORAL | Status: DC | PRN
Start: 2020-12-20 — End: 2020-12-25
  Administered 2020-12-20 – 2020-12-24 (×10): 50 mg via ORAL
  Filled 2020-12-20 (×10): qty 1

## 2020-12-20 MED ORDER — COLCHICINE 0.3 MG HALF TABLET
0.3000 mg | ORAL_TABLET | Freq: Two times a day (BID) | ORAL | Status: AC
  Administered 2020-12-20 (×2): 0.3 mg via ORAL
  Filled 2020-12-20 (×2): qty 1

## 2020-12-20 MED ORDER — AMLODIPINE BESYLATE 10 MG PO TABS
10.0000 mg | ORAL_TABLET | Freq: Every day | ORAL | Status: DC
  Administered 2020-12-20 – 2020-12-25 (×6): 10 mg via ORAL
  Filled 2020-12-20 (×6): qty 1

## 2020-12-20 NOTE — Progress Notes (Signed)
PROGRESS NOTE                                                                                                                                                                                                             Patient Demographics:    Kimberly [name], is a 65 y.o. female, DOB - [birth year], [id number]  Outpatient Primary MD for the patient is [name], FNP    LOS - 2  Admit date - 12/18/2020    No chief complaint on file.      Brief Narrative (HPI from H&P) - Kimberly [name] is a 65 y.o. female with medical history significant for paroxysmal A. fib on Eliquis, prior CVA x2, ascending aortic aneurysm, dilatation of infrarenal abdominal aorta, essential hypertension, hyperlipidemia, chronic anxiety/depression, GERD, tobacco use disorder, physical debility, recently prescribed antibiotics, penicillin on 12/17/2020 for dental caries, who presented to [facility] ED from home after a near syncope event and a fall at home, in the ER she was diagnosed with dehydration, hypotension there was question of sepsis of unclear origin and were admitted to the hospital.   Subjective:   Patient in bed, appears comfortable, denies any headache, no fever, no chest pain or pressure, no shortness of breath , no abdominal pain. No new focal weakness.  Does have pain in her left foot toes.   Assessment  & Plan :      1. Near syncope in the setting of dehydration, possible sepsis - she was recently treated for dental caries and UTI, does not appear toxic, has been started on vancomycin and cefepime upon admission and we will continue until her cultures are negative, sepsis pathophysiology has resolved, she has been adequately hydrated with IV fluids and monitor final cultures and trend procalcitonin.  We will also get PT OT involved and advance activity as tolerated. Note patient has reported history of paroxysmal dizziness.  Continue home dose  meclizine.  She is agreeable to going to SNF if required.  2.  Acute hypoxic respiratory failure - resolved currently symptom-free, most likely was due to transient atelectasis, we will continue to monitor closely.  3.  History of CAD, paroxysmal A. fib, CHA2DS2-VASc 2 score of greater than 4.  She is chest pain-free.  Her troponin rise is in non-ACS pattern, echo noted with EF 55% and possibly old wall motion  abnormalities.  Case discussed with cardiologist Dr. Sallyanne Kuster - outpatient follow-up and further work-up if needed.  Continue combination of Eliquis, statin and Coreg for now.  4.  History of CVA with chronic left-sided hemiparesis.  Continue Eliquis and statin for secondary prevention.  5.  Bilateral foot edema see #11 below.  Likely third spacing.  Had some nonspecific soft tissue discomfort, x-rays are stable, will monitor with PT and advancing activity.   6.  History of smoking and COPD.  Stable supportive care counseled to quit smoking.  7.  Chronic microcytic anemia.  Monitor outpatient follow-up with PCP.  8.  History of ascending aortic aneurysm.  Outpatient cardiology follow-up.  9.  Lung nodule noted on CT chest.  She is a smoker we will have her follow-up with pulmonary outpatient for repeat CT in 12 months.  10.  Moderate pulmonary artery hypertension.  Supportive care outpatient pulmonary follow-up.  11.  Left foot and toe discomfort.  Extremely tender to touch highly suspicious for gout, check uric acid level, x-ray nonacute, dose of prednisone and colchicine.  Monitor closely.      Condition -  Faie  Family Communication  : Deon Pilling [phone] updated on 12/19/2020  Code Status :  Full  Consults  :  Cards  PUD Prophylaxis : PPI   Procedures  :     TTE - 1. Left ventricular ejection fraction, by estimation, is 55%. Left ventricular ejection fraction by 3D volume is 52 %. The left ventricle has normal function. The left ventricle demonstrates regional wall  motion abnormalities (see scoring diagram/findings for description). There is severe asymmetric left ventricular hypertrophy of the apical segment.  2. Right ventricular systolic function is mildly reduced. The right ventricular size is mildly enlarged. There is moderately elevated pulmonary artery systolic pressure. The estimated right ventricular systolic pressure is 123456 mmHg.  3. Left atrial size was severely dilated.  4. Right atrial size was moderately dilated.  5. The mitral valve is grossly normal. Mild mitral valve regurgitation.  6. Tricuspid valve regurgitation is moderate.  7. The aortic valve is grossly normal. Aortic valve regurgitation is not visualized.  8. Aortic dilatation noted. There is mild dilatation of the ascending aorta, measuring 44 mm.  9. The inferior vena cava is dilated in size with <50% respiratory variability, suggesting right atrial pressure of 15 mmHg. Comparison(s): A prior study was performed on 08/19/20. Prior images reviewed side by side. LVEF may be slightly decreased compared to prior, regional wall motion abnormalities may have been present on prior study, but more prominent today.   CT Head & C Spine - Non acute   CT Chest -  1. Mild to moderate severity bilateral lower lobe linear scarring and/or atelectasis with a small noncalcified nodular component within the left lung base. No follow-up needed if patient is low-risk. Non-contrast chest CT can be considered in 12 months if patient is high-risk.      Disposition Plan  :    Status is: Inpatient  Remains inpatient appropriate because:IV treatments appropriate due to intensity of illness or inability to take PO  Dispo:  Patient From: Home  Planned Disposition: Home  Medically stable for discharge: No    DVT Prophylaxis  :    apixaban (ELIQUIS) tablet 5 mg     Lab Results  Component Value Date   PLT 179 12/20/2020    Diet :  Diet Order            Diet Heart Room  service appropriate? Yes; Fluid  consistency: Thin  Diet effective now                  Inpatient Medications  Scheduled Meds: . amLODipine  10 mg Oral Daily  . apixaban  5 mg Oral BID  . atorvastatin  80 mg Oral Daily  . carvedilol  6.25 mg Oral BID WC  . colchicine  0.3 mg Oral BID  . ferrous sulfate  325 mg Oral Q breakfast  . methylPREDNISolone (SOLU-MEDROL) injection  60 mg Intravenous Q24H  . mirtazapine  15 mg Oral QHS  . nicotine  14 mg Transdermal Daily  . pantoprazole  40 mg Oral Daily   Continuous Infusions: . ceFEPime (MAXIPIME) IV 2 g (12/19/20 2020)  . vancomycin 500 mg (12/20/20 0421)   PRN Meds:.acetaminophen, ipratropium-albuterol, meclizine, polyethylene glycol, senna-docusate  Antibiotics  :    Anti-infectives (From admission, onward)   Start     Dose/Rate Route Frequency Ordered Stop   12/19/20 1000  ceFEPIme (MAXIPIME) 2 g in sodium chloride 0.9 % 100 mL IVPB        2 g 200 mL/hr over 30 Minutes Intravenous Every 12 hours 12/18/20 1638     12/19/20 0600  vancomycin (VANCOREADY) IVPB 500 mg/100 mL        500 mg 100 mL/hr over 60 Minutes Intravenous Every 12 hours 12/18/20 1638     12/18/20 1630  ceFEPIme (MAXIPIME) 2 g in sodium chloride 0.9 % 100 mL IVPB        2 g 200 mL/hr over 30 Minutes Intravenous  Once 12/18/20 1629 12/18/20 1947   12/18/20 1630  metroNIDAZOLE (FLAGYL) IVPB 500 mg        500 mg 100 mL/hr over 60 Minutes Intravenous  Once 12/18/20 1629 12/18/20 2138   12/18/20 1630  vancomycin (VANCOREADY) IVPB 1000 mg/200 mL        1,000 mg 200 mL/hr over 60 Minutes Intravenous  Once 12/18/20 1629 12/18/20 2321       Time Spent in minutes  30   Lala Lund M.D on 12/20/2020 at 8:36 AM  To page go to [email or web address]   [facility] -  Office  [phone]    See all Orders from today for further details    Objective:   Vitals:   12/19/20 1219 12/19/20 1619 12/19/20 1933 12/19/20 1957  BP:  (!) 140/106  (!) 142/95  Pulse:  76  69  Resp: 17 16 19 19    Temp:    98.9 F (37.2 C)  TempSrc:    Oral  SpO2:  100%  100%  Weight:      Height:        Wt Readings from Last 3 Encounters:  12/18/20 59 kg  10/14/20 54.6 kg  09/30/20 53.5 kg     Intake/Output Summary (Last 24 hours) at 12/20/2020 0836 Last data filed at 12/19/2020 1624 Gross per 24 hour  Intake 240 ml  Output 500 ml  Net -260 ml     Physical Exam  Awake Alert, chronic left-sided hemiparesis,  R Foot in Proform boot, some L. Foot swelling, tenderness to palpate in all left foot toes. [facility].AT,PERRAL Supple Neck,No JVD, No cervical lymphadenopathy appriciated.  Symmetrical Chest wall movement, Good air movement bilaterally, CTAB RRR,No Gallops, Rubs or new Murmurs, No Parasternal Heave +ve B.Sounds, Abd Soft, No tenderness, No organomegaly appriciated, No rebound - guarding or rigidity. No Cyanosis,      Data Review:    CBC  Recent Labs  Lab 12/18/20 1435 12/19/20 0328 12/20/20 0416  WBC 13.7* 10.1 7.5  HGB 11.4* 10.1* 10.0*  HCT 36.8 31.5* 31.7*  PLT 229 191 179  MCV 71.0* 69.7* 70.0*  MCH 22.0* 22.3* 22.1*  MCHC 31.0 32.1 31.5  RDW 16.6* 16.3* 16.5*  LYMPHSABS 1.8  --  1.8  MONOABS 0.7  --  0.4  EOSABS 0.0  --  0.3  BASOSABS 0.0  --  0.1    Recent Labs  Lab 12/18/20 1320 12/18/20 1430 12/18/20 1435 12/18/20 2232 12/19/20 0328 12/20/20 0416  NA  --   --  142  --  138 139  K  --   --  3.6  --  3.1* 3.7  CL  --   --  111  --  111 112*  CO2  --   --  21*  --  20* 22  GLUCOSE  --   --  107*  --  111* 96  BUN  --   --  21  --  15 12  CREATININE  --   --  0.92  --  0.86 0.80  CALCIUM  --   --  9.6  --  8.8* 8.7*  AST  --   --  48*  --  33 25  ALT  --   --  57*  --  44 36  ALKPHOS  --   --  116  --  89 93  BILITOT  --   --  0.7  --  0.7 0.4  ALBUMIN  --   --  3.9  --  3.2* 2.8*  MG  --   --   --   --  1.6* 1.8  PROCALCITON  --   --   --   --   --  2.67  LATICACIDVEN 4.5*  --   --  3.4* 1.6  --   INR  --  1.6*  --   --   --   --   BNP  --   --    --   --  1,460.4* 705.5*    ------------------------------------------------------------------------------------------------------------------ No results for input(s): CHOL, HDL, LDLCALC, TRIG, CHOLHDL, LDLDIRECT in the last 72 hours.  Lab Results  Component Value Date   HGBA1C 5.5 09/02/2019   ------------------------------------------------------------------------------------------------------------------ No results for input(s): TSH, T4TOTAL, T3FREE, THYROIDAB in the last 72 hours.  Invalid input(s): FREET3  Cardiac Enzymes Recent Labs  Lab 12/18/20 1435  CKMB 7.9*   ------------------------------------------------------------------------------------------------------------------    Component Value Date/Time   BNP 705.5 (H) 12/20/2020 0416    Micro Results Recent Results (from the past 240 hour(s))  Urine culture     Status: None   Collection Time: 12/18/20  6:07 PM   Specimen: In/Out Cath Urine  Result Value Ref Range Status   Specimen Description IN/OUT CATH URINE  Final   Special Requests NONE  Final   Culture   Final    NO GROWTH Performed at [facility], [address], [place], [place] [address]    Report Status 12/19/2020 FINAL  Final  Resp Panel by RT-PCR (Flu A&B, Covid) Nasopharyngeal Swab     Status: None   Collection Time: 12/18/20  7:59 PM   Specimen: Nasopharyngeal Swab; Nasopharyngeal(NP) swabs in vial transport medium  Result Value Ref Range Status   SARS Coronavirus 2 by RT PCR NEGATIVE NEGATIVE Final    Comment: (NOTE) SARS-CoV-2 target nucleic acids are NOT DETECTED.  The SARS-CoV-2 RNA is  generally detectable in upper respiratory specimens during the acute phase of infection. The lowest concentration of SARS-CoV-2 viral copies this assay can detect is 138 copies/mL. A negative result does not preclude SARS-Cov-2 infection and should not be used as the sole basis for treatment or other patient management decisions. A negative result  may occur with  improper specimen collection/handling, submission of specimen other than nasopharyngeal swab, presence of viral mutation(s) within the areas targeted by this assay, and inadequate number of viral copies(<138 copies/mL). A negative result must be combined with clinical observations, patient history, and epidemiological information. The expected result is Negative.  Fact Sheet for Patients:  EntrepreneurPulse.com.au  Fact Sheet for Healthcare Providers:  IncredibleEmployment.be  This test is no t yet approved or cleared by the [place] FDA and  has been authorized for detection and/or diagnosis of SARS-CoV-2 by FDA under an Emergency Use Authorization (EUA). This EUA will remain  in effect (meaning this test can be used) for the duration of the COVID-19 declaration under Section 564(b)(1) of the Act, 21 U.S.C.section 360bbb-3(b)(1), unless the authorization is terminated  or revoked sooner.       Influenza A by PCR NEGATIVE NEGATIVE Final   Influenza B by PCR NEGATIVE NEGATIVE Final    Comment: (NOTE) The Xpert Xpress SARS-CoV-2/FLU/RSV plus assay is intended as an aid in the diagnosis of influenza from Nasopharyngeal swab specimens and should not be used as a sole basis for treatment. Nasal washings and aspirates are unacceptable for Xpert Xpress SARS-CoV-2/FLU/RSV testing.  Fact Sheet for Patients: EntrepreneurPulse.com.au  Fact Sheet for Healthcare Providers: IncredibleEmployment.be  This test is not yet approved or cleared by the [place] FDA and has been authorized for detection and/or diagnosis of SARS-CoV-2 by FDA under an Emergency Use Authorization (EUA). This EUA will remain in effect (meaning this test can be used) for the duration of the COVID-19 declaration under Section 564(b)(1) of the Act, 21 U.S.C. section 360bbb-3(b)(1), unless the authorization is terminated  or revoked.  Performed at [facility], [place] [address] [place], [place] [address]   Blood Culture (routine x 2)     Status: None (Preliminary result)   Collection Time: 12/19/20  3:28 AM   Specimen: BLOOD RIGHT HAND  Result Value Ref Range Status   Specimen Description BLOOD RIGHT HAND  Final   Special Requests   Final    BOTTLES DRAWN AEROBIC ONLY Blood Culture results may not be optimal due to an inadequate volume of blood received in culture bottles   Culture   Final    NO GROWTH 1 DAY Performed at [facility], [place] [address], [place], [place] [address]    Report Status PENDING  Incomplete  Blood Culture (routine x 2)     Status: None (Preliminary result)   Collection Time: 12/19/20  3:28 AM   Specimen: BLOOD  Result Value Ref Range Status   Specimen Description BLOOD SITE NOT SPECIFIED  Final   Special Requests   Final    BOTTLES DRAWN AEROBIC ONLY Blood Culture results may not be optimal due to an inadequate volume of blood received in culture bottles   Culture   Final    NO GROWTH 1 DAY Performed at [facility], [place] [address] [place], [place] [address]    Report Status PENDING  Incomplete    Radiology Reports DG Tibia/Fibula Right  Result Date: 12/18/2020 CLINICAL DATA:  Fall with right lower leg pain. EXAM: RIGHT TIBIA AND  FIBULA - 2 VIEW COMPARISON:  Knee and ankle radiographs same date. FINDINGS: The bones are demineralized. No evidence of acute fracture or dislocation. Mild medial compartment joint space narrowing at the knee, scattered vascular calcifications and diffuse soft tissue edema, greatest distally, are again noted. IMPRESSION: No acute osseous findings identified within the right lower leg. Diffuse soft tissue edema/swelling. Electronically Signed   By: Richardean Sale M.D.   On: 12/18/2020 15:16   DG Ankle Complete Left  Result Date: 12/19/2020 CLINICAL DATA:  65 year old female status post fall with severe pain. EXAM: LEFT ANKLE  COMPLETE - 3+ VIEW COMPARISON:  Left foot series today. FINDINGS: Soft tissue swelling appears greater on the medial aspect of the ankle. No joint effusion on cross-table lateral view. Preserved mortise joint alignment. Talar dome intact. No acute osseous abnormality identified. IMPRESSION: Soft tissue swelling with no acute fracture or dislocation identified about the left ankle. Electronically Signed   By: Genevie Ann M.D.   On: 12/19/2020 10:02   DG Ankle Complete Right  Result Date: 12/18/2020 CLINICAL DATA:  Fall EXAM: RIGHT ANKLE - COMPLETE 3+ VIEW COMPARISON:  None. FINDINGS: The bones appear diffusely demineralized. No evidence of acute fracture or malalignment. Generalized soft tissue swelling present about the ankle and lower leg. Trace atherosclerotic vascular calcifications. IMPRESSION: Soft tissue swelling about the ankle and lower leg without evidence of acute fracture or malalignment. The bones appear demineralized suggesting underlying osteoporosis. Electronically Signed   By: Jacqulynn Cadet M.D.   On: 12/18/2020 13:52   CT Head Wo Contrast  Result Date: 12/18/2020 CLINICAL DATA:  [place] this morning. Left-sided weakness from a previous stroke. Taking blood thinners. EXAM: CT HEAD WITHOUT CONTRAST CT CERVICAL SPINE WITHOUT CONTRAST TECHNIQUE: Multidetector CT imaging of the head and cervical spine was performed following the standard protocol without intravenous contrast. Multiplanar CT image reconstructions of the cervical spine were also generated. COMPARISON:  Head CT dated 04/25/2020. Cervical spine CT dated 02/13/2018. FINDINGS: CT HEAD FINDINGS Brain: Stable old right occipital lobe infarct. Stable old right thalamic lacunar infarct. Stable mildly enlarged ventricles and cortical sulci. Mild-to-moderate patchy white matter low density in both cerebral hemispheres without significant change. No intracranial hemorrhage, mass lesion or CT evidence of acute infarction. Vascular: Dense bilateral  cavernous carotid atheromatous calcifications. Skull: Normal. Negative for fracture or focal lesion. Sinuses/Orbits: Unremarkable. Other: None. CT CERVICAL SPINE FINDINGS Alignment: Mild reversal of the normal cervical lordosis without significant change. No subluxations. Skull base and vertebrae: No acute fracture. No primary bone lesion or focal pathologic process. Soft tissues and spinal canal: No prevertebral fluid or swelling. No visible canal hematoma. Disc levels:  Multilevel degenerative changes with little change. Upper chest: Mild biapical bullous changes. Other: Dense bilateral carotid artery calcifications. IMPRESSION: 1. No skull fracture or intracranial hemorrhage. 2. No cervical spine fracture or subluxation. 3. Stable cerebral atrophy, chronic small vessel white matter ischemic changes, old right occipital lobe infarct and old right thalamic lacunar infarct. 4. Stable cervical spine degenerative changes. Electronically Signed   By: Claudie Revering M.D.   On: 12/18/2020 16:06   CT CHEST WO CONTRAST  Result Date: 12/18/2020 CLINICAL DATA:  Possible sepsis. EXAM: CT CHEST WITHOUT CONTRAST TECHNIQUE: Multidetector CT imaging of the chest was performed following the standard protocol without IV contrast. COMPARISON:  September 21, 2019 FINDINGS: Cardiovascular: There is marked severity calcification of the aortic arch and descending thoracic aorta. The ascending thoracic aorta measures approximately 4.8 cm x 4.2 cm. Stable pulmonary artery enlargement  is seen. There is moderate severity cardiomegaly with marked severity coronary artery calcification. No pericardial effusion. Mediastinum/Nodes: No enlarged mediastinal or axillary lymph nodes. Thyroid gland, trachea, and esophagus demonstrate no significant findings. Lungs/Pleura: Mild to moderate severity areas of scarring and/or linear atelectasis are seen within the bilateral lower lobes. A 5 mm noncalcified nodular component is seen within the  posteromedial aspect of the left lung base (axial CT image 118, CT series number 4). This represents a new finding when compared to the prior exam. There is no evidence of a pleural effusion or pneumothorax. Upper Abdomen: No acute abnormality. Musculoskeletal: Multiple sternal wires are present. No acute osseous abnormalities are identified. IMPRESSION: 1. Mild to moderate severity bilateral lower lobe linear scarring and/or atelectasis with a small noncalcified nodular component within the left lung base. No follow-up needed if patient is low-risk. Non-contrast chest CT can be considered in 12 months if patient is high-risk. This recommendation follows the consensus statement: Guidelines for Management of Incidental Pulmonary Nodules Detected on CT Images: From the Fleischner Society 2017; Radiology 2017; 284:228-243. 2. 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 3. Additional stable findings suggestive of pulmonary arterial hypertension. Electronically Signed   By: Virgina Norfolk M.D.   On: 12/18/2020 18:39   CT Cervical Spine Wo Contrast  Result Date: 12/18/2020 CLINICAL DATA:  [place] this morning. Left-sided weakness from a previous stroke. Taking blood thinners. EXAM: CT HEAD WITHOUT CONTRAST CT CERVICAL SPINE WITHOUT CONTRAST TECHNIQUE: Multidetector CT imaging of the head and cervical spine was performed following the standard protocol without intravenous contrast. Multiplanar CT image reconstructions of the cervical spine were also generated. COMPARISON:  Head CT dated 04/25/2020. Cervical spine CT dated 02/13/2018. FINDINGS: CT HEAD FINDINGS Brain: Stable old right occipital lobe infarct. Stable old right thalamic lacunar infarct. Stable mildly enlarged ventricles and cortical sulci. Mild-to-moderate patchy white matter low density in both cerebral hemispheres without significant change. No intracranial hemorrhage, mass lesion or CT evidence of acute infarction. Vascular: Dense  bilateral cavernous carotid atheromatous calcifications. Skull: Normal. Negative for fracture or focal lesion. Sinuses/Orbits: Unremarkable. Other: None. CT CERVICAL SPINE FINDINGS Alignment: Mild reversal of the normal cervical lordosis without significant change. No subluxations. Skull base and vertebrae: No acute fracture. No primary bone lesion or focal pathologic process. Soft tissues and spinal canal: No prevertebral fluid or swelling. No visible canal hematoma. Disc levels:  Multilevel degenerative changes with little change. Upper chest: Mild biapical bullous changes. Other: Dense bilateral carotid artery calcifications. IMPRESSION: 1. No skull fracture or intracranial hemorrhage. 2. No cervical spine fracture or subluxation. 3. Stable cerebral atrophy, chronic small vessel white matter ischemic changes, old right occipital lobe infarct and old right thalamic lacunar infarct. 4. Stable cervical spine degenerative changes. Electronically Signed   By: Claudie Revering M.D.   On: 12/18/2020 16:06   DG Pelvis Portable  Result Date: 12/18/2020 CLINICAL DATA:  Patient fell. EXAM: PORTABLE PELVIS 1-2 VIEWS COMPARISON:  Pelvic CT 08/19/2020. FINDINGS: The bones appear mildly demineralized. No evidence of acute fracture, dislocation or femoral head avascular necrosis. The hip joint spaces are preserved. There are mild sacroiliac degenerative changes bilaterally. Diffuse vascular calcifications are noted. IMPRESSION: No acute osseous findings. Electronically Signed   By: Richardean Sale M.D.   On: 12/18/2020 15:12   DG Chest Port 1 View  Result Date: 12/18/2020 CLINICAL DATA:  [place].  Smoker. EXAM: PORTABLE CHEST 1 VIEW COMPARISON:  08/18/2020 FINDINGS: Stable enlarged cardiac silhouette and post CABG changes. The aorta remains  tortuous and partially calcified. Clear lungs. The lungs remain mildly hyperexpanded. Unremarkable bones. IMPRESSION: 1. No acute abnormality. 2. Stable cardiomegaly and changes of COPD.  Electronically Signed   By: Claudie Revering M.D.   On: 12/18/2020 15:12   DG Knee Complete 4 Views Left  Result Date: 12/18/2020 CLINICAL DATA:  Left knee pain following a fall today. EXAM: LEFT KNEE - COMPLETE 4+ VIEW COMPARISON:  03/24/2018 FINDINGS: The bones appear osteopenic. No fracture, dislocation or effusion. Atheromatous arterial calcifications. IMPRESSION: 1. No fracture. 2. Atheromatous arterial calcifications. Electronically Signed   By: Claudie Revering M.D.   On: 12/18/2020 13:53   DG Knee Complete 4 Views Right  Result Date: 12/18/2020 CLINICAL DATA:  Right knee pain following a fall today. EXAM: RIGHT KNEE - COMPLETE 4+ VIEW COMPARISON:  None. FINDINGS: Diffuse osteopenia. Mild medial joint space narrowing. No fracture, no fracture or dislocation. Small effusion. Mild diffuse soft tissue swelling. Atheromatous arterial calcifications. IMPRESSION: 1. Small effusion. 2. No fracture or dislocation seen. 3. Mild medial joint space narrowing. 4. Atheromatous arterial calcifications. Electronically Signed   By: Claudie Revering M.D.   On: 12/18/2020 13:55   DG Foot Complete Left  Result Date: 12/19/2020 CLINICAL DATA:  65 year old female status post fall with severe pain. EXAM: LEFT FOOT - COMPLETE 3+ VIEW COMPARISON:  Left foot series 02/09/2012. FINDINGS: Possible chronic fracture of the 5th proximal phalanx since 2013. But no acute osseous abnormality identified. Normal joint spaces and alignment in the left foot. Calcaneus appears intact on the cross-table lateral view. There is mild generalized soft tissue swelling. No soft tissue gas. IMPRESSION: No acute osseous abnormality identified about the left foot. Electronically Signed   By: Genevie Ann M.D.   On: 12/19/2020 10:01   DG Foot Complete Right  Result Date: 12/18/2020 CLINICAL DATA:  Fall onto right side.  Ankle pain. EXAM: RIGHT FOOT COMPLETE - 3+ VIEW COMPARISON:  Ankle radiographs same date. FINDINGS: The bones are demineralized. There is no  evidence of acute fracture or dislocation. Toe evaluation is limited by positioning on the AP and oblique views. The toes appear unremarkable on the lateral view. No focal soft tissue swelling or foreign body identified in the foot. IMPRESSION: Osteopenia without acute osseous findings in the foot. Electronically Signed   By: Richardean Sale M.D.   On: 12/18/2020 15:14   ECHOCARDIOGRAM LIMITED  Result Date: 12/19/2020    ECHOCARDIOGRAM LIMITED REPORT   Patient Name:   ILIAN RILING Date of Exam: 12/19/2020 Medical Rec #:  [id number]         Height:       67.0 in Accession #:    [id number]        Weight:       130.0 lb Date of Birth:  1955-11-22        BSA:          1.684 m Patient Age:    51 years          BP:           136/100 mmHg Patient Gender: F                 HR:           73 bpm. Exam Location:  Inpatient Procedure: Limited Echo, 3D Echo, Cardiac Doppler and Color Doppler Indications:    I50.40* Unspecified combined systolic (congestive) and diastolic                 (  congestive) heart failure  History:        Patient has prior history of Echocardiogram examinations, most                 recent 08/19/2020. Apical variant hypertrophic cardiomyopathy and                 CHF, CAD, Abnormal ECG, Stroke, Arrythmias:Atrial Fibrillation;                 Risk Factors:Hypertension and Current Smoker.  Sonographer:    Roseanna Rainbow RDCS Referring Phys: [id number] Margaree Mackintosh [place]  1. Left ventricular ejection fraction, by estimation, is 55%. Left ventricular ejection fraction by 3D volume is 52 %. The left ventricle has normal function. The left ventricle demonstrates regional wall motion abnormalities (see scoring diagram/findings for description). There is severe asymmetric left ventricular hypertrophy of the apical segment.  2. Right ventricular systolic function is mildly reduced. The right ventricular size is mildly enlarged. There is moderately elevated pulmonary artery systolic pressure. The estimated  right ventricular systolic pressure is 58.5 mmHg.  3. Left atrial size was severely dilated.  4. Right atrial size was moderately dilated.  5. The mitral valve is grossly normal. Mild mitral valve regurgitation.  6. Tricuspid valve regurgitation is moderate.  7. The aortic valve is grossly normal. Aortic valve regurgitation is not visualized.  8. Aortic dilatation noted. There is mild dilatation of the ascending aorta, measuring 44 mm.  9. The inferior vena cava is dilated in size with <50% respiratory variability, suggesting right atrial pressure of 15 mmHg. Comparison(s): A prior study was performed on 08/19/20. Prior images reviewed side by side. LVEF may be slightly decreased compared to prior, regional wall motion abnormalities may have been present on prior study, but more prominent today. FINDINGS  Left Ventricle: Left ventricular ejection fraction, by estimation, is 55%. Left ventricular ejection fraction by 3D volume is 52 %. The left ventricle has normal function. The left ventricle demonstrates regional wall motion abnormalities. There is severe asymmetric left ventricular hypertrophy of the apical segment.  LV Wall Scoring: The basal inferolateral segment is akinetic. The antero-lateral wall, mid inferolateral segment, and basal inferior segment are hypokinetic. Right Ventricle: The right ventricular size is mildly enlarged. Right ventricular systolic function is mildly reduced. There is moderately elevated pulmonary artery systolic pressure. The tricuspid regurgitant velocity is 3.32 m/s, and with an assumed right atrial pressure of 15 mmHg, the estimated right ventricular systolic pressure is 27.7 mmHg. Left Atrium: Left atrial size was severely dilated. Right Atrium: Right atrial size was moderately dilated. Mitral Valve: The mitral valve is grossly normal. Mild mitral valve regurgitation. Tricuspid Valve: Tricuspid valve regurgitation is moderate. Aortic Valve: The aortic valve is grossly normal.  Aortic valve regurgitation is not visualized. Aorta: Aortic dilatation noted. There is mild dilatation of the ascending aorta, measuring 44 mm. Venous: The inferior vena cava is dilated in size with less than 50% respiratory variability, suggesting right atrial pressure of 15 mmHg. LEFT VENTRICLE PLAX 2D LVIDd:         4.40 cm LVIDs:         3.80 cm LV PW:         1.50 cm         3D Volume EF LV IVS:        1.10 cm         LV 3D EF:    Left LVOT diam:     1.80 cm  ventricular LVOT Area:     2.54 cm                     ejection                                             fraction by                                             3D volume LV Volumes (MOD)                            is 52 %. LV vol d, MOD    53.4 ml A2C: LV vol d, MOD    46.9 ml       3D Volume EF: A4C:                           3D EF:        52 % LV vol s, MOD    22.2 ml A2C: LV vol s, MOD    21.3 ml A4C: LV SV MOD A2C:   31.2 ml LV SV MOD A4C:   46.9 ml LV SV MOD BP:    27.5 ml IVC IVC diam: 2.50 cm LEFT ATRIUM         Index LA diam:    2.50 cm 1.48 cm/m   AORTA Ao Root diam: 3.20 cm Ao Asc diam:  4.40 cm TRICUSPID VALVE TR Peak grad:   44.1 mmHg TR Vmax:        332.00 cm/s  SHUNTS Systemic Diam: 1.80 cm Cherlynn Kaiser MD Electronically signed by Cherlynn Kaiser MD Signature Date/Time: 12/19/2020/11:38:29 AM    Final

## 2020-12-20 NOTE — Evaluation (Signed)
Occupational Therapy Evaluation Patient Details Name: Kimberly [name] MRN: [id number] DOB: 09-Sep-1955 Today's Date: 12/20/2020    History of Present Illness Pt presents to ED after near syncopal event with a fall at home. She reports intermittent lightheadedness with taking meds and a toothache recently. Has carpet burns on knees from crawling to phone for 911. PMH: paroxysmal A. fib on Eliquis, prior CVA x2, ascending aortic aneurysm, dilatation of infrarenal abdominal aorta, essential hypertension, hyperlipidemia, chronic anxiety/depression, GERD, tobacco use disorder, physical debility.   Clinical Impression   PTA patient was living in a private residence alone with PRN assist from a PCA and her sister, and was requiring assist with ADLs/IADLs with use of DME/AD at baseline. Patient currently functioning below baseline demonstrating bed mobility and scooting at EOB with Max A. Patient greatly limited by pain in bilateral heels at rest and with movement (active and passive) and would benefit from continued acute OT services in prep for safe d/c to next level of care. Given patient CLOF and deficits, recommendation for SNF rehab. Patient in agreement with POC. OT will continue to follow acutely.       Follow Up Recommendations  SNF;Supervision/Assistance - 24 hour    Equipment Recommendations  None recommended by OT (Patient has necessary DME.)    Recommendations for Other Services       Precautions / Restrictions Precautions Precautions: Fall Restrictions Weight Bearing Restrictions: No Other Position/Activity Restrictions: no restrictions listed in chart but pt with sprained L ankle      Mobility Bed Mobility Overal bed mobility: Needs Assistance Bed Mobility: Supine to Sit;Sit to Supine     Supine to sit: Max assist Sit to supine: Max assist   General bed mobility comments: Max A at trunk and BLE with use of chuck pad for supine <> EOB. Patient able to assist with supine  scoot toward [facility] with HOB lowered. Patient crying/moaning in pain with all movement.    Transfers                 General transfer comment: Unable to tolerate weight through BLE even seated EOB this date. Transfers deferred 2/2 pain.    Balance Overall balance assessment: Needs assistance;History of Falls Sitting-balance support: No upper extremity supported;Feet supported Sitting balance-Leahy Scale: Fair Sitting balance - Comments: able to maintain static sitting balance seated EOB with UE support on bed surface.                                   ADL either performed or assessed with clinical judgement   ADL Overall ADL's : Needs assistance/impaired                     Lower Body Dressing: Maximal assistance;Bed level Lower Body Dressing Details (indicate cue type and reason): Max A to doff/don footwear in supine.               General ADL Comments: Patient greatly limited by pain in BLE despite meds given prior to start of session.     Vision         Perception     Praxis      Pertinent Vitals/Pain Pain Assessment: Faces Faces Pain Scale: Hurts whole lot Pain Location: Burning in bilateral heels at rest worsening with movement. Pain Descriptors / Indicators: Aching;Burning;Crying;Moaning Pain Intervention(s): Limited activity within patient's tolerance;Premedicated before session;Repositioned;Relaxation;Ice applied     Hand  Dominance Right   Extremity/Trunk Assessment Upper Extremity Assessment Upper Extremity Assessment: LUE deficits/detail LUE Deficits / Details: Residual L-sided weakness from preivous CVA. LUE Sensation: decreased light touch LUE Coordination: decreased fine motor;decreased gross motor       Cervical / Trunk Assessment Cervical / Trunk Assessment: Kyphotic   Communication Communication Communication: No difficulties   Cognition Arousal/Alertness: Awake/alert Behavior During Therapy: Anxious Overall  Cognitive Status: No family/caregiver present to determine baseline cognitive functioning                                 General Comments: A&O, follows 1-2 step verbal commands with good accuracy. Limited participation 2/2 pain with all movement (active and passive).   General Comments  VSS on RA. Noted BLE edema R>L.    Exercises     Shoulder Instructions      Home Living Family/patient expects to be discharged to:: Private residence Living Arrangements: Alone Available Help at Discharge: Personal care attendant;Family Type of Home: Apartment Home Access: Stairs to enter [employer] of Steps: 3 Entrance Stairs-Rails: None Home Layout: One level     Bathroom Shower/Tub: Teacher, early years/pre: Standard Bathroom Accessibility: Yes   Home Equipment: Environmental consultant - 2 wheels;Shower seat;Wheelchair - manual          Prior Functioning/Environment Level of Independence: Needs assistance  Gait / Transfers Assistance Needed: 10-20' with RW into the bathroom, w/c for longer distances. ADL's / Homemaking Assistance Needed: Assist from [place] and sister for ADLs/IADLs including bathing, dressing, med management and meal prep. PCA 7 days/wk for 2.5hrs. Daughter lives in [place].            OT Problem List: Decreased strength;Decreased activity tolerance;Impaired balance (sitting and/or standing);Decreased coordination;Pain;Increased edema      OT Treatment/Interventions: Self-care/ADL training;Therapeutic exercise;Neuromuscular education;Energy conservation;Splinting;Therapeutic activities;Patient/family education;Balance training    OT Goals(Current goals can be found in the care plan section) Acute Rehab OT Goals Patient Stated Goal: Agreeable to rehab. OT Goal Formulation: With patient Time For Goal Achievement: 01/03/21 Potential to Achieve Goals: Fair ADL Goals Pt Will Perform Grooming: with set-up;sitting Pt Will Perform Upper Body  Dressing: with set-up;sitting Pt Will Perform Lower Body Dressing: with min assist;sit to/from stand Pt Will Transfer to Toilet: with min assist;bedside commode Pt Will Perform Toileting - Clothing Manipulation and hygiene: with min assist;sit to/from stand;sitting/lateral leans Additional ADL Goal #1: Patient will tolerate sitting at EOB for 15 minutes in prep for participation in seated ADLs.  OT Frequency: Min 2X/week   Barriers to D/C:            Co-evaluation              AM-PAC OT "6 Clicks" Daily Activity     Outcome Measure Help from another person eating meals?: A Little Help from another person taking care of personal grooming?: A Little Help from another person toileting, which includes using toliet, bedpan, or urinal?: Total Help from another person bathing (including washing, rinsing, drying)?: Total Help from another person to put on and taking off regular upper body clothing?: A Little Help from another person to put on and taking off regular lower body clothing?: A Lot 6 Click Score: 13   End of Session Nurse Communication: Mobility status  Activity Tolerance: Patient limited by pain Patient left: in bed;with call bell/phone within reach;with bed alarm set  OT Visit Diagnosis: Unsteadiness on feet (R26.81);Other abnormalities of  gait and mobility (R26.89);Muscle weakness (generalized) (M62.81);History of falling (Z91.81);Pain Pain - Right/Left:  (Bilateral) Pain - part of body:  (Heels)                Time: 5462-7035 OT Time Calculation (min): 23 min Charges:  OT General Charges $OT Visit: 1 Visit OT Evaluation $OT Eval Moderate Complexity: 1 Mod  Rajendra Spiller H. OTR/L Supplemental OT, Department of rehab services [phone]  Elena Cothern R H. 12/20/2020, 1:54 PM

## 2020-12-21 DIAGNOSIS — W19XXXA Unspecified fall, initial encounter: Secondary | ICD-10-CM

## 2020-12-21 DIAGNOSIS — M79675 Pain in left toe(s): Secondary | ICD-10-CM

## 2020-12-21 LAB — COMPREHENSIVE METABOLIC PANEL
ALT: 33 U/L (ref 0–44)
AST: 17 U/L (ref 15–41)
Albumin: 2.6 g/dL — ABNORMAL LOW (ref 3.5–5.0)
Alkaline Phosphatase: 90 U/L (ref 38–126)
Anion gap: 6 (ref 5–15)
BUN: 17 mg/dL (ref 8–23)
CO2: 21 mmol/L — ABNORMAL LOW (ref 22–32)
Calcium: 8.5 mg/dL — ABNORMAL LOW (ref 8.9–10.3)
Chloride: 108 mmol/L (ref 98–111)
Creatinine, Ser: 1.09 mg/dL — ABNORMAL HIGH (ref 0.44–1.00)
GFR, Estimated: 57 mL/min — ABNORMAL LOW (ref >60)
Glucose, Bld: 141 mg/dL — ABNORMAL HIGH (ref 70–99)
Potassium: 3.8 mmol/L (ref 3.5–5.1)
Sodium: 135 mmol/L (ref 135–145)
Total Bilirubin: 0.6 mg/dL (ref 0.3–1.2)
Total Protein: 5.3 g/dL — ABNORMAL LOW (ref 6.5–8.1)

## 2020-12-21 LAB — CBC WITH DIFFERENTIAL/PLATELET
Abs Immature Granulocytes: 0.05 10*3/uL (ref 0.00–0.07)
Basophils Absolute: 0 10*3/uL (ref 0.0–0.1)
Basophils Relative: 0 %
Eosinophils Absolute: 0 10*3/uL (ref 0.0–0.5)
Eosinophils Relative: 0 %
HCT: 31.7 % — ABNORMAL LOW (ref 36.0–46.0)
Hemoglobin: 10 g/dL — ABNORMAL LOW (ref 12.0–15.0)
Immature Granulocytes: 0 %
Lymphocytes Relative: 11 %
Lymphs Abs: 1.3 10*3/uL (ref 0.7–4.0)
MCH: 22.3 pg — ABNORMAL LOW (ref 26.0–34.0)
MCHC: 31.5 g/dL (ref 30.0–36.0)
MCV: 70.8 fL — ABNORMAL LOW (ref 80.0–100.0)
Monocytes Absolute: 0.5 10*3/uL (ref 0.1–1.0)
Monocytes Relative: 5 %
Neutro Abs: 9.5 10*3/uL — ABNORMAL HIGH (ref 1.7–7.7)
Neutrophils Relative %: 84 %
Platelets: 187 10*3/uL (ref 150–400)
RBC: 4.48 MIL/uL (ref 3.87–5.11)
RDW: 17 % — ABNORMAL HIGH (ref 11.5–15.5)
WBC: 11.4 10*3/uL — ABNORMAL HIGH (ref 4.0–10.5)
nRBC: 0 % (ref 0.0–0.2)

## 2020-12-21 LAB — BRAIN NATRIURETIC PEPTIDE: B Natriuretic Peptide: 1182.6 pg/mL — ABNORMAL HIGH (ref 0.0–100.0)

## 2020-12-21 LAB — MAGNESIUM: Magnesium: 1.6 mg/dL — ABNORMAL LOW (ref 1.7–2.4)

## 2020-12-21 LAB — PROCALCITONIN: Procalcitonin: 1.6 ng/mL

## 2020-12-21 MED ORDER — METHYLPREDNISOLONE SODIUM SUCC 125 MG IJ SOLR
60.0000 mg | Freq: Every day | INTRAMUSCULAR | Status: DC
  Administered 2020-12-21 – 2020-12-22 (×2): 60 mg via INTRAVENOUS
  Filled 2020-12-21 (×2): qty 2

## 2020-12-21 MED ORDER — DOXYCYCLINE HYCLATE 100 MG PO TABS
100.0000 mg | ORAL_TABLET | Freq: Two times a day (BID) | ORAL | Status: AC
  Administered 2020-12-22 – 2020-12-24 (×6): 100 mg via ORAL
  Filled 2020-12-21 (×6): qty 1

## 2020-12-21 MED ORDER — MAGNESIUM SULFATE 2 GM/50ML IV SOLN
2.0000 g | Freq: Once | INTRAVENOUS | Status: AC
  Administered 2020-12-21: 2 g via INTRAVENOUS
  Filled 2020-12-21: qty 50

## 2020-12-21 NOTE — Progress Notes (Signed)
Pharmacy Antibiotic Note  Kimberly [name] is a 64 y.o. female admitted on 12/18/2020 presenting with fall, concern for sepsis.  Pharmacy has been consulted for vancomycin and cefepime dosing.  Pt is now on D4 of abx. Cultures remained neg. Scr 1.09. D/w Dr Candiss Norse and we will transition vanc/cefepime to PO doxy starting 5/11 to complete 7d.  Plan: Vanc/cefepime>>5/10 Doxy 100mg  BID 5/11>>5/13  Height: 5\' 7"  (170.2 cm) Weight: 59 kg (130 lb) IBW/kg (Calculated) : 61.6  Temp (24hrs), Avg:98.6 F (37 C), Min:98.5 F (36.9 C), Max:98.6 F (37 C)  Recent Labs  Lab 12/18/20 1320 12/18/20 1435 12/18/20 2232 12/19/20 0328 12/20/20 0416 12/21/20 0251  WBC  --  13.7*  --  10.1 7.5 11.4*  CREATININE  --  0.92  --  0.86 0.80 1.09*  LATICACIDVEN 4.5*  --  3.4* 1.6  --   --     Estimated Creatinine Clearance: 48.6 mL/min (A) (by C-G formula based on SCr of 1.09 mg/dL (H)).    Allergies  Allergen Reactions  . Clonidine Derivatives Other (See Comments)    Patch causes scars  . Spironolactone Rash   Cefepime 5/7>>5/10 Vanc 5/7>>5/10 Doxy 5/11>>5/13  5/8 blood>>ngtd 5/7 urine>>ngF  Onnie Boer, PharmD, [place], AAHIVP, CPP Infectious Disease Pharmacist 12/21/2020 11:25 AM

## 2020-12-21 NOTE — TOC Initial Note (Addendum)
Transition of Care [facility]) - Initial/Assessment Note    Patient Details  Name: Kimberly [name] MRN: [id number] Date of Birth: 12/18/1955  Transition of Care [facility] CM/SW Contact:    Angelita Ingles, RN Phone Number: [phone]  12/21/2020, 11:36 AM  Clinical Narrative:                 [facility] consulted for patient with SNF recommendation. Patient requested to be evaluated for possible CIR placement.  Patient has been evaluated and is not a candidate for CIR. CM spoke with patients sister per patients request. Per sister Kimberly [name] the family does not want patient to go to a facility. The family is able to provide 24 hour care. Patients sister inquiring about home health PT due to most [facility] agencies will not accept maedicaid. CM has made sister aware that CM will work on PT but can not guarantee HH. TOC will continue to follow.  Expected Discharge Plan: [place] Barriers to Discharge: Continued Medical Work up   Patient Goals and CMS Choice Patient states their goals for this hospitalization and ongoing recovery are:: Patient states that she is ready to go home CMS Medicare.gov Compare Post Acute Care list provided to:: Patient Choice offered to / list presented to : Patient  Expected Discharge Plan and Services Expected Discharge Plan: [place] In-house Referral: NA Discharge Planning Services: CM Consult Post Acute Care Choice: [place] arrangements for the past 2 months: Apartment                 DME Arranged: N/A DME Agency: NA                  Prior Living Arrangements/Services Living arrangements for the past 2 months: Apartment Lives with:: Self Patient language and need for interpreter reviewed:: Yes Do you feel safe going back to the place where you live?: Yes      Need for Family Participation in Patient Care: Yes (Comment) Care giver support system in place?: Yes (comment) Current home services: Homehealth  aide Criminal Activity/Legal Involvement Pertinent to Current Situation/Hospitalization: No - Comment as needed  Activities of Daily Living      Permission Sought/Granted Permission sought to share information with : Family Supports Permission granted to share information with : Yes, Verbal Permission Granted  Share Information with NAME: Kimberly [name] (sister)     Permission granted to share info w Relationship: sister     Emotional Assessment Appearance:: Appears stated age Attitude/Demeanor/Rapport: Gracious Affect (typically observed): Pleasant Orientation: : Oriented to Self,Oriented to Place,Oriented to  Time,Oriented to Situation Alcohol / Substance Use: Other (comment),Not Applicable Psych Involvement: No (comment)  Admission diagnosis:  Hypoxia [R09.02] SIRS (systemic inflammatory response syndrome) ([place]) [R65.10] Pain, dental [K08.89] Fall, initial encounter [W19.XXXA] Right leg injury, initial encounter [S89.91XA] Sepsis [facility]) [A41.9] Patient Active Problem List   Diagnosis Date Noted  . Sepsis ([place]) 12/18/2020  . Family history of malignant neoplasm of gastrointestinal tract 12/07/2020  . Personal history of colonic polyps 12/07/2020  . Secondary hypercoagulable state ([place]) 10/14/2020  . Right sided abdominal pain   . Abdominal wall pain   . Atrial fibrillation ([place]) 08/19/2020  . Educated about COVID-19 virus infection 11/16/2019  . Urinary frequency 04/14/2019  . Cough 04/14/2019  . AKI (acute kidney injury) ([place]) 11/26/2018  . Diarrhea 11/25/2018  . Dehydration, mild 11/25/2018  . Hemiparesis affecting nondominant 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 ([place]) 07/31/2018  .  Muscle ache 07/31/2018  . Prediabetes 07/31/2018  . Chronic gout without tophus 07/31/2018  . Vision loss 07/31/2018  . Malnutrition of moderate degree 02/15/2018  . Carotid occlusion, bilateral   . Atrial fibrillation with RVR ([place]) 02/13/2018  . Positive D dimer 02/13/2018   . Back pain 02/13/2018  . CVA (cerebral vascular accident) ([place]) 02/13/2018  . Paroxysmal atrial fibrillation ([place]) 02/13/2018  . Coronary artery disease involving native coronary artery of native heart without angina pectoris 02/13/2018  . Apical variant hypertrophic cardiomyopathy ([place]) 02/13/2018  . 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 Aortic atherosclerosis ([place]) 02/13/2018  . CAD, ARTERY BYPASS GRAFT 05/03/2009  . TOBACCO USE, QUIT 05/03/2009  . ROTATOR CUFF SYNDROME, LEFT 02/12/2009  . Shoulder pain, left 11/03/2008  . HAMMER TOE, ACQUIRED 02/11/2008  . INSECT BITE, LEG 02/11/2008  . Cerebral artery occlusion with cerebral infarction ([place]) 12/05/2007  . Dyslipidemia 11/05/2007  . UNSPECIFIED DISORDER TEETH&SUPPORTING STRUCTURES 11/05/2007  . BICEPS TENDINITIS, RIGHT 04/24/2007  . Essential hypertension 04/23/2007  . MICROCYTOSIS 04/23/2007   PCP:  [name], [place] Pharmacy:   [facility] [place] [address] Phone: [phone] Fax: [place] [address] [place] [address] Phone: [phone] Fax: [phone]  [place], [place] - [address] [place] [address] Phone: [phone] Fax: [phone]  [facility] - [place], [place] - [address] [place] [address] Phone: [phone] Fax: [phone]  [facility] [phone] - [place], [place] [address] [place] [address] Phone: [phone] Fax: [phone]     Social Determinants of Health ([place]) Interventions    Readmission Risk Interventions Readmission Risk Prevention Plan 12/21/2020  Transportation Screening Complete  PCP or Specialist Appt within 5-7 Days Complete  Home Care Screening Complete   Medication Review (RN CM) Referral to Pharmacy  Some recent data might be hidden

## 2020-12-21 NOTE — Progress Notes (Signed)
PROGRESS NOTE                                                                                                                                                                                                             Patient Demographics:    Kimberly [name], is a 65 y.o. female, DOB - 1956-02-03, [id number]  Outpatient Primary MD for the patient is [name], FNP    LOS - 3  Admit date - 12/18/2020    No chief complaint on file.      Brief Narrative (HPI from H&P) - Kimberly [name] is a 65 y.o. female with medical history significant for paroxysmal A. fib on Eliquis, prior CVA x2, ascending aortic aneurysm, dilatation of infrarenal abdominal aorta, essential hypertension, hyperlipidemia, chronic anxiety/depression, GERD, tobacco use disorder, physical debility, recently prescribed antibiotics, penicillin on 12/17/2020 for dental caries, who presented to [facility] ED from home after a near syncope event and a fall at home, in the ER she was diagnosed with dehydration, hypotension there was question of sepsis of unclear origin and were admitted to the hospital.   Subjective:   Patient in bed, appears comfortable, denies any headache, no fever, no chest pain or pressure, no shortness of breath , no abdominal pain. No focal weakness.  Much improved left foot and toe pain.   Assessment  & Plan :      1. Near syncope in the setting of dehydration, possible sepsis - she was recently treated for dental caries and UTI, does not appear toxic, has been started on vancomycin and cefepime upon admission and we will continue until her cultures are negative, sepsis pathophysiology has resolved, she has been adequately hydrated with IV fluids and monitor final cultures and trend procalcitonin.  We will also get PT OT involved and advance activity as tolerated. Note patient has reported history of paroxysmal dizziness.  Continue home dose  meclizine.  She is agreeable to going to SNF if required.  2.  Acute hypoxic respiratory failure - resolved currently symptom-free, most likely was due to transient atelectasis, we will continue to monitor closely.  3.  History of CAD, paroxysmal A. fib, CHA2DS2-VASc 2 score of greater than 4.  She is chest pain-free.  Her troponin rise is in non-ACS pattern, echo noted with EF 55% and possibly old wall motion abnormalities.  Case discussed with cardiologist Dr. Sallyanne Kuster - outpatient follow-up and further work-up if needed.  Continue combination of Eliquis, statin and Coreg for now.  4.  History of CVA with chronic left-sided hemiparesis.  Continue Eliquis and statin for secondary prevention.  5.  Bilateral foot edema see #11 below.  Likely third spacing.  Had some nonspecific soft tissue discomfort, x-rays are stable, will monitor with PT and advancing activity.   6.  History of smoking and COPD.  Stable supportive care counseled to quit smoking.  7.  Chronic microcytic anemia.  Monitor outpatient follow-up with PCP.  8.  History of ascending aortic aneurysm.  Outpatient cardiology follow-up.  9.  Lung nodule noted on CT chest.  She is a smoker we will have her follow-up with pulmonary outpatient for repeat CT in 12 months.  10.  Moderate pulmonary artery hypertension.  Supportive care outpatient pulmonary follow-up.  11.  Hypomagnesemia.  Replaced.   12.  Left foot and toe discomfort.  Extremely tender to touch highly suspicious for gout, stable uric acid level and x-rays likely uric acid normal due to consumption, much improved after a dose of Solu-Medrol and colchicine which will be continued.  Advance activity and monitor      Condition -  [facility]  Family Communication  : Deon Pilling [phone] updated on 12/19/2020, 12/21/20  Code Status :  Full  Consults  :  Cards  PUD Prophylaxis : PPI   Procedures  :     TTE - 1. Left ventricular ejection fraction, by estimation, is 55%.  Left ventricular ejection fraction by 3D volume is 52 %. The left ventricle has normal function. The left ventricle demonstrates regional wall motion abnormalities (see scoring diagram/findings for description). There is severe asymmetric left ventricular hypertrophy of the apical segment.  2. Right ventricular systolic function is mildly reduced. The right ventricular size is mildly enlarged. There is moderately elevated pulmonary artery systolic pressure. The estimated right ventricular systolic pressure is 123456 mmHg.  3. Left atrial size was severely dilated.  4. Right atrial size was moderately dilated.  5. The mitral valve is grossly normal. Mild mitral valve regurgitation.  6. Tricuspid valve regurgitation is moderate.  7. The aortic valve is grossly normal. Aortic valve regurgitation is not visualized.  8. Aortic dilatation noted. There is mild dilatation of the ascending aorta, measuring 44 mm.  9. The inferior vena cava is dilated in size with <50% respiratory variability, suggesting right atrial pressure of 15 mmHg. Comparison(s): A prior study was performed on 08/19/20. Prior images reviewed side by side. LVEF may be slightly decreased compared to prior, regional wall motion abnormalities may have been present on prior study, but more prominent today.   CT Head & C Spine - Non acute   CT Chest -  1. Mild to moderate severity bilateral lower lobe linear scarring and/or atelectasis with a small noncalcified nodular component within the left lung base. No follow-up needed if patient is low-risk. Non-contrast chest CT can be considered in 12 months if patient is high-risk.      Disposition Plan  :    Status is: Inpatient  Remains inpatient appropriate because:IV treatments appropriate due to intensity of illness or inability to take PO  Dispo:  Patient From: Home  Planned Disposition: [place]  Medically stable for discharge: No    DVT Prophylaxis  :    apixaban (ELIQUIS)  tablet 5 mg     Lab Results  Component Value Date  PLT 187 12/21/2020    Diet :  Diet Order            Diet Heart Room service appropriate? Yes; Fluid consistency: Thin  Diet effective now                  Inpatient Medications  Scheduled Meds: . amLODipine  10 mg Oral Daily  . apixaban  5 mg Oral BID  . atorvastatin  80 mg Oral Daily  . carvedilol  6.25 mg Oral BID WC  . ferrous sulfate  325 mg Oral Q breakfast  . mirtazapine  15 mg Oral QHS  . nicotine  14 mg Transdermal Daily  . pantoprazole  40 mg Oral Daily   Continuous Infusions: . ceFEPime (MAXIPIME) IV 2 g (12/20/20 2035)  . magnesium sulfate bolus IVPB    . vancomycin 500 mg (12/20/20 1732)   PRN Meds:.acetaminophen, ipratropium-albuterol, meclizine, polyethylene glycol, senna-docusate, traMADol  Antibiotics  :    Anti-infectives (From admission, onward)   Start     Dose/Rate Route Frequency Ordered Stop   12/19/20 1000  ceFEPIme (MAXIPIME) 2 g in sodium chloride 0.9 % 100 mL IVPB        2 g 200 mL/hr over 30 Minutes Intravenous Every 12 hours 12/18/20 1638     12/19/20 0600  vancomycin (VANCOREADY) IVPB 500 mg/100 mL        500 mg 100 mL/hr over 60 Minutes Intravenous Every 12 hours 12/18/20 1638     12/18/20 1630  ceFEPIme (MAXIPIME) 2 g in sodium chloride 0.9 % 100 mL IVPB        2 g 200 mL/hr over 30 Minutes Intravenous  Once 12/18/20 1629 12/18/20 1947   12/18/20 1630  metroNIDAZOLE (FLAGYL) IVPB 500 mg        500 mg 100 mL/hr over 60 Minutes Intravenous  Once 12/18/20 1629 12/18/20 2138   12/18/20 1630  vancomycin (VANCOREADY) IVPB 1000 mg/200 mL        1,000 mg 200 mL/hr over 60 Minutes Intravenous  Once 12/18/20 1629 12/18/20 2321       Time Spent in minutes  30   Lala Lund M.D on 12/21/2020 at 9:53 AM  To page go to [email or web address]   [facility] -  Office  [phone]    See all Orders from today for further details    Objective:   Vitals:   12/20/20 1000 12/20/20  1640 12/20/20 2133 12/21/20 0409  BP: (!) 139/107 (!) 126/95 (!) 112/58 112/64  Pulse:  80 80 67  Resp: 17 20 20    Temp: 98.9 F (37.2 C) 98.6 F (37 C) 98.5 F (36.9 C) 98.6 F (37 C)  TempSrc: Oral Oral Oral Oral  SpO2: 100% 99% 99% 99%  Weight:      Height:        Wt Readings from Last 3 Encounters:  12/18/20 59 kg  10/14/20 54.6 kg  09/30/20 53.5 kg     Intake/Output Summary (Last 24 hours) at 12/21/2020 0953 Last data filed at 12/20/2020 1800 Gross per 24 hour  Intake 600 ml  Output 650 ml  Net -50 ml     Physical Exam  Awake Alert, chronic left-sided hemiparesis,  R Foot in Proform boot, some L. Foot mild swelling, tenderness to palpate in all left foot toes. [place].AT,PERRAL Supple Neck,No JVD, No cervical lymphadenopathy appriciated.  Symmetrical Chest wall movement, Good air movement bilaterally, CTAB RRR,No Gallops, Rubs or new Murmurs, No Parasternal Heave +ve B.Sounds,  Abd Soft, No tenderness, No organomegaly appriciated, No rebound - guarding or rigidity. No Cyanosis    Data Review:    CBC Recent Labs  Lab 12/18/20 1435 12/19/20 0328 12/20/20 0416 12/21/20 0251  WBC 13.7* 10.1 7.5 11.4*  HGB 11.4* 10.1* 10.0* 10.0*  HCT 36.8 31.5* 31.7* 31.7*  PLT 229 191 179 187  MCV 71.0* 69.7* 70.0* 70.8*  MCH 22.0* 22.3* 22.1* 22.3*  MCHC 31.0 32.1 31.5 31.5  RDW 16.6* 16.3* 16.5* 17.0*  LYMPHSABS 1.8  --  1.8 1.3  MONOABS 0.7  --  0.4 0.5  EOSABS 0.0  --  0.3 0.0  BASOSABS 0.0  --  0.1 0.0    Recent Labs  Lab 12/18/20 1320 12/18/20 1430 12/18/20 1435 12/18/20 2232 12/19/20 0328 12/20/20 0416 12/21/20 0251  NA  --   --  142  --  138 139 135  K  --   --  3.6  --  3.1* 3.7 3.8  CL  --   --  111  --  111 112* 108  CO2  --   --  21*  --  20* 22 21*  GLUCOSE  --   --  107*  --  111* 96 141*  BUN  --   --  21  --  15 12 17   CREATININE  --   --  0.92  --  0.86 0.80 1.09*  CALCIUM  --   --  9.6  --  8.8* 8.7* 8.5*  AST  --   --  48*  --  33 25 17   ALT  --   --  57*  --  44 36 33  ALKPHOS  --   --  116  --  89 93 90  BILITOT  --   --  0.7  --  0.7 0.4 0.6  ALBUMIN  --   --  3.9  --  3.2* 2.8* 2.6*  MG  --   --   --   --  1.6* 1.8 1.6*  PROCALCITON  --   --   --   --   --  2.67 1.60  LATICACIDVEN 4.5*  --   --  3.4* 1.6  --   --   INR  --  1.6*  --   --   --   --   --   BNP  --   --   --   --  1,460.4* 705.5* 1,182.6*    ------------------------------------------------------------------------------------------------------------------ No results for input(s): CHOL, HDL, LDLCALC, TRIG, CHOLHDL, LDLDIRECT in the last 72 hours.  Lab Results  Component Value Date   HGBA1C 5.5 09/02/2019   ------------------------------------------------------------------------------------------------------------------ No results for input(s): TSH, T4TOTAL, T3FREE, THYROIDAB in the last 72 hours.  Invalid input(s): FREET3  Cardiac Enzymes Recent Labs  Lab 12/18/20 1435  CKMB 7.9*   ------------------------------------------------------------------------------------------------------------------    Component Value Date/Time   BNP 1,182.6 (H) 12/21/2020 0251    Micro Results Recent Results (from the past 240 hour(s))  Urine culture     Status: None   Collection Time: 12/18/20  6:07 PM   Specimen: In/Out Cath Urine  Result Value Ref Range Status   Specimen Description IN/OUT CATH URINE  Final   Special Requests NONE  Final   Culture   Final    NO GROWTH Performed at [facility], [address], [place], [place] [address]    Report Status 12/19/2020 FINAL  Final  Resp Panel by RT-PCR (Flu A&B,  Covid) Nasopharyngeal Swab     Status: None   Collection Time: 12/18/20  7:59 PM   Specimen: Nasopharyngeal Swab; Nasopharyngeal(NP) swabs in vial transport medium  Result Value Ref Range Status   SARS Coronavirus 2 by RT PCR NEGATIVE NEGATIVE Final    Comment: (NOTE) SARS-CoV-2 target nucleic acids are NOT DETECTED.  The SARS-CoV-2  RNA is generally detectable in upper respiratory specimens during the acute phase of infection. The lowest concentration of SARS-CoV-2 viral copies this assay can detect is 138 copies/mL. A negative result does not preclude SARS-Cov-2 infection and should not be used as the sole basis for treatment or other patient management decisions. A negative result may occur with  improper specimen collection/handling, submission of specimen other than nasopharyngeal swab, presence of viral mutation(s) within the areas targeted by this assay, and inadequate number of viral copies(<138 copies/mL). A negative result must be combined with clinical observations, patient history, and epidemiological information. The expected result is Negative.  Fact Sheet for Patients:  EntrepreneurPulse.com.au  Fact Sheet for Healthcare Providers:  IncredibleEmployment.be  This test is no t yet approved or cleared by the [place] FDA and  has been authorized for detection and/or diagnosis of SARS-CoV-2 by FDA under an Emergency Use Authorization (EUA). This EUA will remain  in effect (meaning this test can be used) for the duration of the COVID-19 declaration under Section 564(b)(1) of the Act, 21 U.S.C.section 360bbb-3(b)(1), unless the authorization is terminated  or revoked sooner.       Influenza A by PCR NEGATIVE NEGATIVE Final   Influenza B by PCR NEGATIVE NEGATIVE Final    Comment: (NOTE) The Xpert Xpress SARS-CoV-2/FLU/RSV plus assay is intended as an aid in the diagnosis of influenza from Nasopharyngeal swab specimens and should not be used as a sole basis for treatment. Nasal washings and aspirates are unacceptable for Xpert Xpress SARS-CoV-2/FLU/RSV testing.  Fact Sheet for Patients: EntrepreneurPulse.com.au  Fact Sheet for Healthcare Providers: IncredibleEmployment.be  This test is not yet approved or cleared by the  [place] FDA and has been authorized for detection and/or diagnosis of SARS-CoV-2 by FDA under an Emergency Use Authorization (EUA). This EUA will remain in effect (meaning this test can be used) for the duration of the COVID-19 declaration under Section 564(b)(1) of the Act, 21 U.S.C. section 360bbb-3(b)(1), unless the authorization is terminated or revoked.  Performed at [facility], [place] [address], [place], [place] [address]   Blood Culture (routine x 2)     Status: None (Preliminary result)   Collection Time: 12/19/20  3:28 AM   Specimen: BLOOD RIGHT HAND  Result Value Ref Range Status   Specimen Description BLOOD RIGHT HAND  Final   Special Requests   Final    BOTTLES DRAWN AEROBIC ONLY Blood Culture results may not be optimal due to an inadequate volume of blood received in culture bottles   Culture   Final    NO GROWTH 2 DAYS Performed at [facility], [place] [address] [place], [place] [address]    Report Status PENDING  Incomplete  Blood Culture (routine x 2)     Status: None (Preliminary result)   Collection Time: 12/19/20  3:28 AM   Specimen: BLOOD  Result Value Ref Range Status   Specimen Description BLOOD SITE NOT SPECIFIED  Final   Special Requests   Final    BOTTLES DRAWN AEROBIC ONLY Blood Culture results may not be optimal due to an inadequate volume of blood received  in culture bottles   Culture   Final    NO GROWTH 2 DAYS Performed at [facility], [place] [address] [place], [place] [address]    Report Status PENDING  Incomplete    Radiology Reports DG Tibia/Fibula Right  Result Date: 12/18/2020 CLINICAL DATA:  Fall with right lower leg pain. EXAM: RIGHT TIBIA AND FIBULA - 2 VIEW COMPARISON:  Knee and ankle radiographs same date. FINDINGS: The bones are demineralized. No evidence of acute fracture or dislocation. Mild medial compartment joint space narrowing at the knee, scattered vascular calcifications and diffuse soft tissue edema,  greatest distally, are again noted. IMPRESSION: No acute osseous findings identified within the right lower leg. Diffuse soft tissue edema/swelling. Electronically Signed   By: Richardean Sale M.D.   On: 12/18/2020 15:16   DG Ankle Complete Left  Result Date: 12/19/2020 CLINICAL DATA:  65 year old female status post fall with severe pain. EXAM: LEFT ANKLE COMPLETE - 3+ VIEW COMPARISON:  Left foot series today. FINDINGS: Soft tissue swelling appears greater on the medial aspect of the ankle. No joint effusion on cross-table lateral view. Preserved mortise joint alignment. Talar dome intact. No acute osseous abnormality identified. IMPRESSION: Soft tissue swelling with no acute fracture or dislocation identified about the left ankle. Electronically Signed   By: Genevie Ann M.D.   On: 12/19/2020 10:02   DG Ankle Complete Right  Result Date: 12/18/2020 CLINICAL DATA:  Fall EXAM: RIGHT ANKLE - COMPLETE 3+ VIEW COMPARISON:  None. FINDINGS: The bones appear diffusely demineralized. No evidence of acute fracture or malalignment. Generalized soft tissue swelling present about the ankle and lower leg. Trace atherosclerotic vascular calcifications. IMPRESSION: Soft tissue swelling about the ankle and lower leg without evidence of acute fracture or malalignment. The bones appear demineralized suggesting underlying osteoporosis. Electronically Signed   By: Jacqulynn Cadet M.D.   On: 12/18/2020 13:52   CT Head Wo Contrast  Result Date: 12/18/2020 CLINICAL DATA:  [place] this morning. Left-sided weakness from a previous stroke. Taking blood thinners. EXAM: CT HEAD WITHOUT CONTRAST CT CERVICAL SPINE WITHOUT CONTRAST TECHNIQUE: Multidetector CT imaging of the head and cervical spine was performed following the standard protocol without intravenous contrast. Multiplanar CT image reconstructions of the cervical spine were also generated. COMPARISON:  Head CT dated 04/25/2020. Cervical spine CT dated 02/13/2018. FINDINGS: CT HEAD  FINDINGS Brain: Stable old right occipital lobe infarct. Stable old right thalamic lacunar infarct. Stable mildly enlarged ventricles and cortical sulci. Mild-to-moderate patchy white matter low density in both cerebral hemispheres without significant change. No intracranial hemorrhage, mass lesion or CT evidence of acute infarction. Vascular: Dense bilateral cavernous carotid atheromatous calcifications. Skull: Normal. Negative for fracture or focal lesion. Sinuses/Orbits: Unremarkable. Other: None. CT CERVICAL SPINE FINDINGS Alignment: Mild reversal of the normal cervical lordosis without significant change. No subluxations. Skull base and vertebrae: No acute fracture. No primary bone lesion or focal pathologic process. Soft tissues and spinal canal: No prevertebral fluid or swelling. No visible canal hematoma. Disc levels:  Multilevel degenerative changes with little change. Upper chest: Mild biapical bullous changes. Other: Dense bilateral carotid artery calcifications. IMPRESSION: 1. No skull fracture or intracranial hemorrhage. 2. No cervical spine fracture or subluxation. 3. Stable cerebral atrophy, chronic small vessel white matter ischemic changes, old right occipital lobe infarct and old right thalamic lacunar infarct. 4. Stable cervical spine degenerative changes. Electronically Signed   By: Claudie Revering M.D.   On: 12/18/2020 16:06   CT CHEST WO CONTRAST  Result Date:  12/18/2020 CLINICAL DATA:  Possible sepsis. EXAM: CT CHEST WITHOUT CONTRAST TECHNIQUE: Multidetector CT imaging of the chest was performed following the standard protocol without IV contrast. COMPARISON:  September 21, 2019 FINDINGS: Cardiovascular: There is marked severity calcification of the aortic arch and descending thoracic aorta. The ascending thoracic aorta measures approximately 4.8 cm x 4.2 cm. Stable pulmonary artery enlargement is seen. There is moderate severity cardiomegaly with marked severity coronary artery calcification.  No pericardial effusion. Mediastinum/Nodes: No enlarged mediastinal or axillary lymph nodes. Thyroid gland, trachea, and esophagus demonstrate no significant findings. Lungs/Pleura: Mild to moderate severity areas of scarring and/or linear atelectasis are seen within the bilateral lower lobes. A 5 mm noncalcified nodular component is seen within the posteromedial aspect of the left lung base (axial CT image 118, CT series number 4). This represents a new finding when compared to the prior exam. There is no evidence of a pleural effusion or pneumothorax. Upper Abdomen: No acute abnormality. Musculoskeletal: Multiple sternal wires are present. No acute osseous abnormalities are identified. IMPRESSION: 1. Mild to moderate severity bilateral lower lobe linear scarring and/or atelectasis with a small noncalcified nodular component within the left lung base. No follow-up needed if patient is low-risk. Non-contrast chest CT can be considered in 12 months if patient is high-risk. This recommendation follows the consensus statement: Guidelines for Management of Incidental Pulmonary Nodules Detected on CT Images: From the Fleischner Society 2017; Radiology 2017; 284:228-243. 2. 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 3. Additional stable findings suggestive of pulmonary arterial hypertension. Electronically Signed   By: Virgina Norfolk M.D.   On: 12/18/2020 18:39   CT Cervical Spine Wo Contrast  Result Date: 12/18/2020 CLINICAL DATA:  [place] this morning. Left-sided weakness from a previous stroke. Taking blood thinners. EXAM: CT HEAD WITHOUT CONTRAST CT CERVICAL SPINE WITHOUT CONTRAST TECHNIQUE: Multidetector CT imaging of the head and cervical spine was performed following the standard protocol without intravenous contrast. Multiplanar CT image reconstructions of the cervical spine were also generated. COMPARISON:  Head CT dated 04/25/2020. Cervical spine CT dated 02/13/2018. FINDINGS: CT HEAD  FINDINGS Brain: Stable old right occipital lobe infarct. Stable old right thalamic lacunar infarct. Stable mildly enlarged ventricles and cortical sulci. Mild-to-moderate patchy white matter low density in both cerebral hemispheres without significant change. No intracranial hemorrhage, mass lesion or CT evidence of acute infarction. Vascular: Dense bilateral cavernous carotid atheromatous calcifications. Skull: Normal. Negative for fracture or focal lesion. Sinuses/Orbits: Unremarkable. Other: None. CT CERVICAL SPINE FINDINGS Alignment: Mild reversal of the normal cervical lordosis without significant change. No subluxations. Skull base and vertebrae: No acute fracture. No primary bone lesion or focal pathologic process. Soft tissues and spinal canal: No prevertebral fluid or swelling. No visible canal hematoma. Disc levels:  Multilevel degenerative changes with little change. Upper chest: Mild biapical bullous changes. Other: Dense bilateral carotid artery calcifications. IMPRESSION: 1. No skull fracture or intracranial hemorrhage. 2. No cervical spine fracture or subluxation. 3. Stable cerebral atrophy, chronic small vessel white matter ischemic changes, old right occipital lobe infarct and old right thalamic lacunar infarct. 4. Stable cervical spine degenerative changes. Electronically Signed   By: Claudie Revering M.D.   On: 12/18/2020 16:06   DG Pelvis Portable  Result Date: 12/18/2020 CLINICAL DATA:  Patient fell. EXAM: PORTABLE PELVIS 1-2 VIEWS COMPARISON:  Pelvic CT 08/19/2020. FINDINGS: The bones appear mildly demineralized. No evidence of acute fracture, dislocation or femoral head avascular necrosis. The hip joint spaces are preserved. There are mild sacroiliac degenerative changes  bilaterally. Diffuse vascular calcifications are noted. IMPRESSION: No acute osseous findings. Electronically Signed   By: Richardean Sale M.D.   On: 12/18/2020 15:12   DG Chest Port 1 View  Result Date: 12/18/2020 CLINICAL  DATA:  [place].  Smoker. EXAM: PORTABLE CHEST 1 VIEW COMPARISON:  08/18/2020 FINDINGS: Stable enlarged cardiac silhouette and post CABG changes. The aorta remains tortuous and partially calcified. Clear lungs. The lungs remain mildly hyperexpanded. Unremarkable bones. IMPRESSION: 1. No acute abnormality. 2. Stable cardiomegaly and changes of COPD. Electronically Signed   By: Claudie Revering M.D.   On: 12/18/2020 15:12   DG Knee Complete 4 Views Left  Result Date: 12/18/2020 CLINICAL DATA:  Left knee pain following a fall today. EXAM: LEFT KNEE - COMPLETE 4+ VIEW COMPARISON:  03/24/2018 FINDINGS: The bones appear osteopenic. No fracture, dislocation or effusion. Atheromatous arterial calcifications. IMPRESSION: 1. No fracture. 2. Atheromatous arterial calcifications. Electronically Signed   By: Claudie Revering M.D.   On: 12/18/2020 13:53   DG Knee Complete 4 Views Right  Result Date: 12/18/2020 CLINICAL DATA:  Right knee pain following a fall today. EXAM: RIGHT KNEE - COMPLETE 4+ VIEW COMPARISON:  None. FINDINGS: Diffuse osteopenia. Mild medial joint space narrowing. No fracture, no fracture or dislocation. Small effusion. Mild diffuse soft tissue swelling. Atheromatous arterial calcifications. IMPRESSION: 1. Small effusion. 2. No fracture or dislocation seen. 3. Mild medial joint space narrowing. 4. Atheromatous arterial calcifications. Electronically Signed   By: Claudie Revering M.D.   On: 12/18/2020 13:55   DG Foot Complete Left  Result Date: 12/19/2020 CLINICAL DATA:  65 year old female status post fall with severe pain. EXAM: LEFT FOOT - COMPLETE 3+ VIEW COMPARISON:  Left foot series 02/09/2012. FINDINGS: Possible chronic fracture of the 5th proximal phalanx since 2013. But no acute osseous abnormality identified. Normal joint spaces and alignment in the left foot. Calcaneus appears intact on the cross-table lateral view. There is mild generalized soft tissue swelling. No soft tissue gas. IMPRESSION: No acute  osseous abnormality identified about the left foot. Electronically Signed   By: Genevie Ann M.D.   On: 12/19/2020 10:01   DG Foot Complete Right  Result Date: 12/18/2020 CLINICAL DATA:  Fall onto right side.  Ankle pain. EXAM: RIGHT FOOT COMPLETE - 3+ VIEW COMPARISON:  Ankle radiographs same date. FINDINGS: The bones are demineralized. There is no evidence of acute fracture or dislocation. Toe evaluation is limited by positioning on the AP and oblique views. The toes appear unremarkable on the lateral view. No focal soft tissue swelling or foreign body identified in the foot. IMPRESSION: Osteopenia without acute osseous findings in the foot. Electronically Signed   By: Richardean Sale M.D.   On: 12/18/2020 15:14   ECHOCARDIOGRAM LIMITED  Result Date: 12/19/2020    ECHOCARDIOGRAM LIMITED REPORT   Patient Name:   RAYLEEN SERRATOS Date of Exam: 12/19/2020 Medical Rec #:  [id number]         Height:       67.0 in Accession #:    [id number]        Weight:       130.0 lb Date of Birth:  Dec 26, 1955        BSA:          1.684 m Patient Age:    85 years          BP:           136/100 mmHg Patient Gender: F  HR:           73 bpm. Exam Location:  Inpatient Procedure: Limited Echo, 3D Echo, Cardiac Doppler and Color Doppler Indications:    I50.40* Unspecified combined systolic (congestive) and diastolic                 (congestive) heart failure  History:        Patient has prior history of Echocardiogram examinations, most                 recent 08/19/2020. Apical variant hypertrophic cardiomyopathy and                 CHF, CAD, Abnormal ECG, Stroke, Arrythmias:Atrial Fibrillation;                 Risk Factors:Hypertension and Current Smoker.  Sonographer:    Roseanna Rainbow RDCS Referring Phys: [id number] [place]  1. Left ventricular ejection fraction, by estimation, is 55%. Left ventricular ejection fraction by 3D volume is 52 %. The left ventricle has normal function. The left ventricle demonstrates  regional wall motion abnormalities (see scoring diagram/findings for description). There is severe asymmetric left ventricular hypertrophy of the apical segment.  2. Right ventricular systolic function is mildly reduced. The right ventricular size is mildly enlarged. There is moderately elevated pulmonary artery systolic pressure. The estimated right ventricular systolic pressure is 123456 mmHg.  3. Left atrial size was severely dilated.  4. Right atrial size was moderately dilated.  5. The mitral valve is grossly normal. Mild mitral valve regurgitation.  6. Tricuspid valve regurgitation is moderate.  7. The aortic valve is grossly normal. Aortic valve regurgitation is not visualized.  8. Aortic dilatation noted. There is mild dilatation of the ascending aorta, measuring 44 mm.  9. The inferior vena cava is dilated in size with <50% respiratory variability, suggesting right atrial pressure of 15 mmHg. Comparison(s): A prior study was performed on 08/19/20. Prior images reviewed side by side. LVEF may be slightly decreased compared to prior, regional wall motion abnormalities may have been present on prior study, but more prominent today. FINDINGS  Left Ventricle: Left ventricular ejection fraction, by estimation, is 55%. Left ventricular ejection fraction by 3D volume is 52 %. The left ventricle has normal function. The left ventricle demonstrates regional wall motion abnormalities. There is severe asymmetric left ventricular hypertrophy of the apical segment.  LV Wall Scoring: The basal inferolateral segment is akinetic. The antero-lateral wall, mid inferolateral segment, and basal inferior segment are hypokinetic. Right Ventricle: The right ventricular size is mildly enlarged. Right ventricular systolic function is mildly reduced. There is moderately elevated pulmonary artery systolic pressure. The tricuspid regurgitant velocity is 3.32 m/s, and with an assumed right atrial pressure of 15 mmHg, the estimated right  ventricular systolic pressure is 123456 mmHg. Left Atrium: Left atrial size was severely dilated. Right Atrium: Right atrial size was moderately dilated. Mitral Valve: The mitral valve is grossly normal. Mild mitral valve regurgitation. Tricuspid Valve: Tricuspid valve regurgitation is moderate. Aortic Valve: The aortic valve is grossly normal. Aortic valve regurgitation is not visualized. Aorta: Aortic dilatation noted. There is mild dilatation of the ascending aorta, measuring 44 mm. Venous: The inferior vena cava is dilated in size with less than 50% respiratory variability, suggesting right atrial pressure of 15 mmHg. LEFT VENTRICLE PLAX 2D LVIDd:         4.40 cm LVIDs:         3.80 cm LV PW:  1.50 cm         3D Volume EF LV IVS:        1.10 cm         LV 3D EF:    Left LVOT diam:     1.80 cm                      ventricular LVOT Area:     2.54 cm                     ejection                                             fraction by                                             3D volume LV Volumes (MOD)                            is 52 %. LV vol d, MOD    53.4 ml A2C: LV vol d, MOD    46.9 ml       3D Volume EF: A4C:                           3D EF:        52 % LV vol s, MOD    22.2 ml A2C: LV vol s, MOD    21.3 ml A4C: LV SV MOD A2C:   31.2 ml LV SV MOD A4C:   46.9 ml LV SV MOD BP:    27.5 ml IVC IVC diam: 2.50 cm LEFT ATRIUM         Index LA diam:    2.50 cm 1.48 cm/m   AORTA Ao Root diam: 3.20 cm Ao Asc diam:  4.40 cm TRICUSPID VALVE TR Peak grad:   44.1 mmHg TR Vmax:        332.00 cm/s  SHUNTS Systemic Diam: 1.80 cm Cherlynn Kaiser MD Electronically signed by Cherlynn Kaiser MD Signature Date/Time: 12/19/2020/11:38:29 AM    Final

## 2020-12-22 MED ORDER — MAGNESIUM SULFATE 2 GM/50ML IV SOLN
2.0000 g | Freq: Once | INTRAVENOUS | Status: AC
  Administered 2020-12-22: 2 g via INTRAVENOUS
  Filled 2020-12-22: qty 50

## 2020-12-22 MED ORDER — METHYLPREDNISOLONE SODIUM SUCC 40 MG IJ SOLR
40.0000 mg | Freq: Two times a day (BID) | INTRAMUSCULAR | Status: DC
  Administered 2020-12-22 – 2020-12-24 (×5): 40 mg via INTRAVENOUS
  Filled 2020-12-22 (×5): qty 1

## 2020-12-22 MED ORDER — POTASSIUM CHLORIDE 2 MEQ/ML IV SOLN
INTRAVENOUS | Status: AC
  Filled 2020-12-22: qty 1000

## 2020-12-22 NOTE — Progress Notes (Signed)
Pt able to push down with both feet on mattress to lift bottom off of bed to get on to bedpan. Without any yelling about pain.

## 2020-12-22 NOTE — Progress Notes (Signed)
Pt. Stated she doesn't want anymore IV meds. Stated she is tired of them. Refused.

## 2020-12-22 NOTE — Progress Notes (Signed)
PROGRESS NOTE                                                                                                                                                                                                             Patient Demographics:    Kimberly [name], is a 65 y.o. female, DOB - Sep 05, 1955, [id number]  Outpatient Primary MD for the patient is [name], FNP    LOS - 4  Admit date - 12/18/2020    No chief complaint on file.      Brief Narrative (HPI from H&P) - Kimberly [name] is a 65 y.o. female with medical history significant for paroxysmal A. fib on Eliquis, prior CVA x2, ascending aortic aneurysm, dilatation of infrarenal abdominal aorta, essential hypertension, hyperlipidemia, chronic anxiety/depression, GERD, tobacco use disorder, physical debility, recently prescribed antibiotics, penicillin on 12/17/2020 for dental caries, who presented to [facility] ED from home after a near syncope event and a fall at home, in the ER she was diagnosed with dehydration, hypotension there was question of sepsis of unclear origin and were admitted to the hospital.   Subjective:   Patient in bed, appears comfortable, denies any headache, no fever, no chest pain or pressure, no shortness of breath , no abdominal pain. No new focal weakness. Improving left foot and toe pain.   Assessment  & Plan :      1. Near syncope in the setting of dehydration, possible sepsis - she was recently treated for dental caries and UTI, does not appear toxic, has been started on vancomycin and cefepime upon admission and we will continue until her cultures are negative, sepsis pathophysiology has resolved, she has been adequately hydrated with IV fluids and monitor final cultures and trend procalcitonin negative till 12/22/20.  We will also get PT OT involved and advance activity as tolerated. Note patient has reported history of paroxysmal dizziness.   Continue home dose meclizine. Post Dc Dental follow up.  2.  Acute hypoxic respiratory failure - resolved currently symptom-free, most likely was due to transient atelectasis, we will continue to monitor closely.  3.  History of CAD, paroxysmal A. fib, CHA2DS2-VASc 2 score of greater than 4.  She is chest pain-free.  Her troponin rise is in non-ACS pattern, echo noted with EF 55% and possibly old wall motion abnormalities.  Case discussed with  cardiologist Dr. Sallyanne Kuster - outpatient follow-up and further work-up if needed.  Continue combination of Eliquis, statin and Coreg for now.  4.  History of CVA with chronic left-sided hemiparesis.  Continue Eliquis and statin for secondary prevention.  5.  Bilateral foot edema see #11 below.  Likely third spacing.  Had some nonspecific soft tissue discomfort, x-rays are stable, will monitor with PT and advancing activity.   6.  History of smoking and COPD.  Stable supportive care counseled to quit smoking.  7.  Chronic microcytic anemia.  Monitor outpatient follow-up with PCP.  8.  History of ascending aortic aneurysm.  Outpatient cardiology follow-up.  9.  Lung nodule noted on CT chest.  She is a smoker we will have her follow-up with pulmonary outpatient for repeat CT in 12 months.  10.  Moderate pulmonary artery hypertension.  Supportive care outpatient pulmonary follow-up.  11.  Hypomagnesemia.  Replaced.   12.  Left foot and toe discomfort.  Extremely tender to touch highly suspicious for gout, stable uric acid level and x-rays likely uric acid normal due to consumption, much improved after a dose of Solu-Medrol and colchicine which will be continued.  Advance activity, clinically better continue to improve likely discharge on 5/12, refused SNF now.      Condition -  Faie  Family Communication  : Deon Pilling [phone] updated on 12/19/2020, 12/21/20  Code Status :  Full  Consults  :  Cards  PUD Prophylaxis : PPI   Procedures  :      TTE - 1. Left ventricular ejection fraction, by estimation, is 55%. Left ventricular ejection fraction by 3D volume is 52 %. The left ventricle has normal function. The left ventricle demonstrates regional wall motion abnormalities (see scoring diagram/findings for description). There is severe asymmetric left ventricular hypertrophy of the apical segment.  2. Right ventricular systolic function is mildly reduced. The right ventricular size is mildly enlarged. There is moderately elevated pulmonary artery systolic pressure. The estimated right ventricular systolic pressure is 123456 mmHg.  3. Left atrial size was severely dilated.  4. Right atrial size was moderately dilated.  5. The mitral valve is grossly normal. Mild mitral valve regurgitation.  6. Tricuspid valve regurgitation is moderate.  7. The aortic valve is grossly normal. Aortic valve regurgitation is not visualized.  8. Aortic dilatation noted. There is mild dilatation of the ascending aorta, measuring 44 mm.  9. The inferior vena cava is dilated in size with <50% respiratory variability, suggesting right atrial pressure of 15 mmHg. Comparison(s): A prior study was performed on 08/19/20. Prior images reviewed side by side. LVEF may be slightly decreased compared to prior, regional wall motion abnormalities may have been present on prior study, but more prominent today.   CT Head & C Spine - Non acute   CT Chest -  1. Mild to moderate severity bilateral lower lobe linear scarring and/or atelectasis with a small noncalcified nodular component within the left lung base. No follow-up needed if patient is low-risk. Non-contrast chest CT can be considered in 12 months if patient is high-risk.      Disposition Plan  :    Status is: Inpatient  Remains inpatient appropriate because:IV treatments appropriate due to intensity of illness or inability to take PO  Dispo:  Patient From: Home  Planned Disposition: Home  Medically stable for discharge:  No    DVT Prophylaxis  :    apixaban (ELIQUIS) tablet 5 mg     Lab Results  Component Value Date   PLT 187 12/21/2020    Diet :  Diet Order            Diet Heart Room service appropriate? Yes; Fluid consistency: Thin  Diet effective now                  Inpatient Medications  Scheduled Meds: . amLODipine  10 mg Oral Daily  . apixaban  5 mg Oral BID  . atorvastatin  80 mg Oral Daily  . carvedilol  6.25 mg Oral BID WC  . doxycycline  100 mg Oral Q12H  . ferrous sulfate  325 mg Oral Q breakfast  . methylPREDNISolone (SOLU-MEDROL) injection  40 mg Intravenous Q12H  . mirtazapine  15 mg Oral QHS  . nicotine  14 mg Transdermal Daily  . pantoprazole  40 mg Oral Daily   Continuous Infusions:  PRN Meds:.acetaminophen, ipratropium-albuterol, meclizine, polyethylene glycol, senna-docusate, traMADol  Antibiotics  :    Anti-infectives (From admission, onward)   Start     Dose/Rate Route Frequency Ordered Stop   12/22/20 1000  doxycycline (VIBRA-TABS) tablet 100 mg        100 mg Oral Every 12 hours 12/21/20 1123 12/25/20 0959   12/19/20 1000  ceFEPIme (MAXIPIME) 2 g in sodium chloride 0.9 % 100 mL IVPB        2 g 200 mL/hr over 30 Minutes Intravenous Every 12 hours 12/18/20 1638 12/22/20 0929   12/19/20 0600  vancomycin (VANCOREADY) IVPB 500 mg/100 mL        500 mg 100 mL/hr over 60 Minutes Intravenous Every 12 hours 12/18/20 1638 12/21/20 2359   12/18/20 1630  ceFEPIme (MAXIPIME) 2 g in sodium chloride 0.9 % 100 mL IVPB        2 g 200 mL/hr over 30 Minutes Intravenous  Once 12/18/20 1629 12/18/20 1947   12/18/20 1630  metroNIDAZOLE (FLAGYL) IVPB 500 mg        500 mg 100 mL/hr over 60 Minutes Intravenous  Once 12/18/20 1629 12/18/20 2138   12/18/20 1630  vancomycin (VANCOREADY) IVPB 1000 mg/200 mL        1,000 mg 200 mL/hr over 60 Minutes Intravenous  Once 12/18/20 1629 12/18/20 2321       Time Spent in minutes  30   Lala Lund M.D on 12/22/2020 at 11:05  AM  To page go to [email or web address]   [facility] -  Office  [phone]    See all Orders from today for further details    Objective:   Vitals:   12/21/20 1623 12/21/20 2011 12/22/20 0513 12/22/20 0931  BP: (!) 124/101 (!) 158/113 (!) 143/95 (!) 148/97  Pulse: 75 69 70 69  Resp: 20 20 20    Temp: 98 F (36.7 C) 98.4 F (36.9 C) 97.8 F (36.6 C)   TempSrc: Oral Axillary Oral   SpO2:  100% 100%   Weight:      Height:        Wt Readings from Last 3 Encounters:  12/18/20 59 kg  10/14/20 54.6 kg  09/30/20 53.5 kg     Intake/Output Summary (Last 24 hours) at 12/22/2020 1105 Last data filed at 12/22/2020 0331 Gross per 24 hour  Intake 590 ml  Output 800 ml  Net -210 ml     Physical Exam  Awake Alert, chronic left-sided hemiparesis,  R Foot in Proform boot, some L. Foot mild swelling, tenderness to palpate in all left foot toes. [place].AT,PERRAL Supple Neck,No JVD, No  cervical lymphadenopathy appriciated.  Symmetrical Chest wall movement, Good air movement bilaterally, CTAB RRR,No Gallops, Rubs or new Murmurs, No Parasternal Heave +ve B.Sounds, Abd Soft, No tenderness, No organomegaly appriciated, No rebound - guarding or rigidity. No Cyanosis, Clubbing or edema, No new Rash or bruise    Data Review:    CBC Recent Labs  Lab 12/18/20 1435 12/19/20 0328 12/20/20 0416 12/21/20 0251  WBC 13.7* 10.1 7.5 11.4*  HGB 11.4* 10.1* 10.0* 10.0*  HCT 36.8 31.5* 31.7* 31.7*  PLT 229 191 179 187  MCV 71.0* 69.7* 70.0* 70.8*  MCH 22.0* 22.3* 22.1* 22.3*  MCHC 31.0 32.1 31.5 31.5  RDW 16.6* 16.3* 16.5* 17.0*  LYMPHSABS 1.8  --  1.8 1.3  MONOABS 0.7  --  0.4 0.5  EOSABS 0.0  --  0.3 0.0  BASOSABS 0.0  --  0.1 0.0    Recent Labs  Lab 12/18/20 1320 12/18/20 1430 12/18/20 1435 12/18/20 2232 12/19/20 0328 12/20/20 0416 12/21/20 0251  NA  --   --  142  --  138 139 135  K  --   --  3.6  --  3.1* 3.7 3.8  CL  --   --  111  --  111 112* 108  CO2  --   --  21*   --  20* 22 21*  GLUCOSE  --   --  107*  --  111* 96 141*  BUN  --   --  21  --  15 12 17   CREATININE  --   --  0.92  --  0.86 0.80 1.09*  CALCIUM  --   --  9.6  --  8.8* 8.7* 8.5*  AST  --   --  48*  --  33 25 17  ALT  --   --  57*  --  44 36 33  ALKPHOS  --   --  116  --  89 93 90  BILITOT  --   --  0.7  --  0.7 0.4 0.6  ALBUMIN  --   --  3.9  --  3.2* 2.8* 2.6*  MG  --   --   --   --  1.6* 1.8 1.6*  PROCALCITON  --   --   --   --   --  2.67 1.60  LATICACIDVEN 4.5*  --   --  3.4* 1.6  --   --   INR  --  1.6*  --   --   --   --   --   BNP  --   --   --   --  1,460.4* 705.5* 1,182.6*    ------------------------------------------------------------------------------------------------------------------ No results for input(s): CHOL, HDL, LDLCALC, TRIG, CHOLHDL, LDLDIRECT in the last 72 hours.  Lab Results  Component Value Date   HGBA1C 5.5 09/02/2019   ------------------------------------------------------------------------------------------------------------------ No results for input(s): TSH, T4TOTAL, T3FREE, THYROIDAB in the last 72 hours.  Invalid input(s): FREET3  Cardiac Enzymes Recent Labs  Lab 12/18/20 1435  CKMB 7.9*   ------------------------------------------------------------------------------------------------------------------    Component Value Date/Time   BNP 1,182.6 (H) 12/21/2020 0251    Micro Results Recent Results (from the past 240 hour(s))  Urine culture     Status: None   Collection Time: 12/18/20  6:07 PM   Specimen: In/Out Cath Urine  Result Value Ref Range Status   Specimen Description IN/OUT CATH URINE  Final   Special Requests NONE  Final   Culture   Final  NO GROWTH Performed at [facility], [place] [address] [place], [place] [address]    Report Status 12/19/2020 FINAL  Final  Resp Panel by RT-PCR (Flu A&B, Covid) Nasopharyngeal Swab     Status: None   Collection Time: 12/18/20  7:59 PM   Specimen: Nasopharyngeal Swab;  Nasopharyngeal(NP) swabs in vial transport medium  Result Value Ref Range Status   SARS Coronavirus 2 by RT PCR NEGATIVE NEGATIVE Final    Comment: (NOTE) SARS-CoV-2 target nucleic acids are NOT DETECTED.  The SARS-CoV-2 RNA is generally detectable in upper respiratory specimens during the acute phase of infection. The lowest concentration of SARS-CoV-2 viral copies this assay can detect is 138 copies/mL. A negative result does not preclude SARS-Cov-2 infection and should not be used as the sole basis for treatment or other patient management decisions. A negative result may occur with  improper specimen collection/handling, submission of specimen other than nasopharyngeal swab, presence of viral mutation(s) within the areas targeted by this assay, and inadequate number of viral copies(<138 copies/mL). A negative result must be combined with clinical observations, patient history, and epidemiological information. The expected result is Negative.  Fact Sheet for Patients:  EntrepreneurPulse.com.au  Fact Sheet for Healthcare Providers:  IncredibleEmployment.be  This test is no t yet approved or cleared by the [place] FDA and  has been authorized for detection and/or diagnosis of SARS-CoV-2 by FDA under an Emergency Use Authorization (EUA). This EUA will remain  in effect (meaning this test can be used) for the duration of the COVID-19 declaration under Section 564(b)(1) of the Act, 21 U.S.C.section 360bbb-3(b)(1), unless the authorization is terminated  or revoked sooner.       Influenza A by PCR NEGATIVE NEGATIVE Final   Influenza B by PCR NEGATIVE NEGATIVE Final    Comment: (NOTE) The Xpert Xpress SARS-CoV-2/FLU/RSV plus assay is intended as an aid in the diagnosis of influenza from Nasopharyngeal swab specimens and should not be used as a sole basis for treatment. Nasal washings and aspirates are unacceptable for Xpert Xpress  SARS-CoV-2/FLU/RSV testing.  Fact Sheet for Patients: EntrepreneurPulse.com.au  Fact Sheet for Healthcare Providers: IncredibleEmployment.be  This test is not yet approved or cleared by the [place] FDA and has been authorized for detection and/or diagnosis of SARS-CoV-2 by FDA under an Emergency Use Authorization (EUA). This EUA will remain in effect (meaning this test can be used) for the duration of the COVID-19 declaration under Section 564(b)(1) of the Act, 21 U.S.C. section 360bbb-3(b)(1), unless the authorization is terminated or revoked.  Performed at [facility], [place] [address], [place], [place] [address]   Blood Culture (routine x 2)     Status: None (Preliminary result)   Collection Time: 12/19/20  3:28 AM   Specimen: BLOOD RIGHT HAND  Result Value Ref Range Status   Specimen Description BLOOD RIGHT HAND  Final   Special Requests   Final    BOTTLES DRAWN AEROBIC ONLY Blood Culture results may not be optimal due to an inadequate volume of blood received in culture bottles   Culture   Final    NO GROWTH 3 DAYS Performed at [facility], [place] [address], [place], [place] [address]    Report Status PENDING  Incomplete  Blood Culture (routine x 2)     Status: None (Preliminary result)   Collection Time: 12/19/20  3:28 AM   Specimen: BLOOD  Result Value Ref Range Status   Specimen Description BLOOD SITE NOT SPECIFIED  Final   Special Requests   Final    BOTTLES DRAWN AEROBIC ONLY Blood Culture results may not be optimal due to an inadequate volume of blood received in culture bottles   Culture   Final    NO GROWTH 3 DAYS Performed at [facility], [place] [address] [place], [place] [address]    Report Status PENDING  Incomplete    Radiology Reports DG Tibia/Fibula Right  Result Date: 12/18/2020 CLINICAL DATA:  Fall with right lower leg pain. EXAM: RIGHT TIBIA AND FIBULA - 2 VIEW COMPARISON:  Knee and ankle  radiographs same date. FINDINGS: The bones are demineralized. No evidence of acute fracture or dislocation. Mild medial compartment joint space narrowing at the knee, scattered vascular calcifications and diffuse soft tissue edema, greatest distally, are again noted. IMPRESSION: No acute osseous findings identified within the right lower leg. Diffuse soft tissue edema/swelling. Electronically Signed   By: Richardean Sale M.D.   On: 12/18/2020 15:16   DG Ankle Complete Left  Result Date: 12/19/2020 CLINICAL DATA:  65 year old female status post fall with severe pain. EXAM: LEFT ANKLE COMPLETE - 3+ VIEW COMPARISON:  Left foot series today. FINDINGS: Soft tissue swelling appears greater on the medial aspect of the ankle. No joint effusion on cross-table lateral view. Preserved mortise joint alignment. Talar dome intact. No acute osseous abnormality identified. IMPRESSION: Soft tissue swelling with no acute fracture or dislocation identified about the left ankle. Electronically Signed   By: Genevie Ann M.D.   On: 12/19/2020 10:02   DG Ankle Complete Right  Result Date: 12/18/2020 CLINICAL DATA:  Fall EXAM: RIGHT ANKLE - COMPLETE 3+ VIEW COMPARISON:  None. FINDINGS: The bones appear diffusely demineralized. No evidence of acute fracture or malalignment. Generalized soft tissue swelling present about the ankle and lower leg. Trace atherosclerotic vascular calcifications. IMPRESSION: Soft tissue swelling about the ankle and lower leg without evidence of acute fracture or malalignment. The bones appear demineralized suggesting underlying osteoporosis. Electronically Signed   By: Jacqulynn Cadet M.D.   On: 12/18/2020 13:52   CT Head Wo Contrast  Result Date: 12/18/2020 CLINICAL DATA:  [place] this morning. Left-sided weakness from a previous stroke. Taking blood thinners. EXAM: CT HEAD WITHOUT CONTRAST CT CERVICAL SPINE WITHOUT CONTRAST TECHNIQUE: Multidetector CT imaging of the head and cervical spine was performed  following the standard protocol without intravenous contrast. Multiplanar CT image reconstructions of the cervical spine were also generated. COMPARISON:  Head CT dated 04/25/2020. Cervical spine CT dated 02/13/2018. FINDINGS: CT HEAD FINDINGS Brain: Stable old right occipital lobe infarct. Stable old right thalamic lacunar infarct. Stable mildly enlarged ventricles and cortical sulci. Mild-to-moderate patchy white matter low density in both cerebral hemispheres without significant change. No intracranial hemorrhage, mass lesion or CT evidence of acute infarction. Vascular: Dense bilateral cavernous carotid atheromatous calcifications. Skull: Normal. Negative for fracture or focal lesion. Sinuses/Orbits: Unremarkable. Other: None. CT CERVICAL SPINE FINDINGS Alignment: Mild reversal of the normal cervical lordosis without significant change. No subluxations. Skull base and vertebrae: No acute fracture. No primary bone lesion or focal pathologic process. Soft tissues and spinal canal: No prevertebral fluid or swelling. No visible canal hematoma. Disc levels:  Multilevel degenerative changes with little change. Upper chest: Mild biapical bullous changes. Other: Dense bilateral carotid artery calcifications. IMPRESSION: 1. No skull fracture or intracranial hemorrhage. 2. No cervical spine fracture or subluxation. 3. Stable cerebral atrophy, chronic small vessel white matter ischemic changes, old right occipital lobe infarct and old right thalamic lacunar  infarct. 4. Stable cervical spine degenerative changes. Electronically Signed   By: Claudie Revering M.D.   On: 12/18/2020 16:06   CT CHEST WO CONTRAST  Result Date: 12/18/2020 CLINICAL DATA:  Possible sepsis. EXAM: CT CHEST WITHOUT CONTRAST TECHNIQUE: Multidetector CT imaging of the chest was performed following the standard protocol without IV contrast. COMPARISON:  September 21, 2019 FINDINGS: Cardiovascular: There is marked severity calcification of the aortic arch and  descending thoracic aorta. The ascending thoracic aorta measures approximately 4.8 cm x 4.2 cm. Stable pulmonary artery enlargement is seen. There is moderate severity cardiomegaly with marked severity coronary artery calcification. No pericardial effusion. Mediastinum/Nodes: No enlarged mediastinal or axillary lymph nodes. Thyroid gland, trachea, and esophagus demonstrate no significant findings. Lungs/Pleura: Mild to moderate severity areas of scarring and/or linear atelectasis are seen within the bilateral lower lobes. A 5 mm noncalcified nodular component is seen within the posteromedial aspect of the left lung base (axial CT image 118, CT series number 4). This represents a new finding when compared to the prior exam. There is no evidence of a pleural effusion or pneumothorax. Upper Abdomen: No acute abnormality. Musculoskeletal: Multiple sternal wires are present. No acute osseous abnormalities are identified. IMPRESSION: 1. Mild to moderate severity bilateral lower lobe linear scarring and/or atelectasis with a small noncalcified nodular component within the left lung base. No follow-up needed if patient is low-risk. Non-contrast chest CT can be considered in 12 months if patient is high-risk. This recommendation follows the consensus statement: Guidelines for Management of Incidental Pulmonary Nodules Detected on CT Images: From the Fleischner Society 2017; Radiology 2017; 284:228-243. 2. 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 3. Additional stable findings suggestive of pulmonary arterial hypertension. Electronically Signed   By: Virgina Norfolk M.D.   On: 12/18/2020 18:39   CT Cervical Spine Wo Contrast  Result Date: 12/18/2020 CLINICAL DATA:  [place] this morning. Left-sided weakness from a previous stroke. Taking blood thinners. EXAM: CT HEAD WITHOUT CONTRAST CT CERVICAL SPINE WITHOUT CONTRAST TECHNIQUE: Multidetector CT imaging of the head and cervical spine was performed  following the standard protocol without intravenous contrast. Multiplanar CT image reconstructions of the cervical spine were also generated. COMPARISON:  Head CT dated 04/25/2020. Cervical spine CT dated 02/13/2018. FINDINGS: CT HEAD FINDINGS Brain: Stable old right occipital lobe infarct. Stable old right thalamic lacunar infarct. Stable mildly enlarged ventricles and cortical sulci. Mild-to-moderate patchy white matter low density in both cerebral hemispheres without significant change. No intracranial hemorrhage, mass lesion or CT evidence of acute infarction. Vascular: Dense bilateral cavernous carotid atheromatous calcifications. Skull: Normal. Negative for fracture or focal lesion. Sinuses/Orbits: Unremarkable. Other: None. CT CERVICAL SPINE FINDINGS Alignment: Mild reversal of the normal cervical lordosis without significant change. No subluxations. Skull base and vertebrae: No acute fracture. No primary bone lesion or focal pathologic process. Soft tissues and spinal canal: No prevertebral fluid or swelling. No visible canal hematoma. Disc levels:  Multilevel degenerative changes with little change. Upper chest: Mild biapical bullous changes. Other: Dense bilateral carotid artery calcifications. IMPRESSION: 1. No skull fracture or intracranial hemorrhage. 2. No cervical spine fracture or subluxation. 3. Stable cerebral atrophy, chronic small vessel white matter ischemic changes, old right occipital lobe infarct and old right thalamic lacunar infarct. 4. Stable cervical spine degenerative changes. Electronically Signed   By: Claudie Revering M.D.   On: 12/18/2020 16:06   DG Pelvis Portable  Result Date: 12/18/2020 CLINICAL DATA:  Patient fell. EXAM: PORTABLE PELVIS 1-2 VIEWS COMPARISON:  Pelvic CT  08/19/2020. FINDINGS: The bones appear mildly demineralized. No evidence of acute fracture, dislocation or femoral head avascular necrosis. The hip joint spaces are preserved. There are mild sacroiliac degenerative  changes bilaterally. Diffuse vascular calcifications are noted. IMPRESSION: No acute osseous findings. Electronically Signed   By: Richardean Sale M.D.   On: 12/18/2020 15:12   DG Chest Port 1 View  Result Date: 12/18/2020 CLINICAL DATA:  [place].  Smoker. EXAM: PORTABLE CHEST 1 VIEW COMPARISON:  08/18/2020 FINDINGS: Stable enlarged cardiac silhouette and post CABG changes. The aorta remains tortuous and partially calcified. Clear lungs. The lungs remain mildly hyperexpanded. Unremarkable bones. IMPRESSION: 1. No acute abnormality. 2. Stable cardiomegaly and changes of COPD. Electronically Signed   By: Claudie Revering M.D.   On: 12/18/2020 15:12   DG Knee Complete 4 Views Left  Result Date: 12/18/2020 CLINICAL DATA:  Left knee pain following a fall today. EXAM: LEFT KNEE - COMPLETE 4+ VIEW COMPARISON:  03/24/2018 FINDINGS: The bones appear osteopenic. No fracture, dislocation or effusion. Atheromatous arterial calcifications. IMPRESSION: 1. No fracture. 2. Atheromatous arterial calcifications. Electronically Signed   By: Claudie Revering M.D.   On: 12/18/2020 13:53   DG Knee Complete 4 Views Right  Result Date: 12/18/2020 CLINICAL DATA:  Right knee pain following a fall today. EXAM: RIGHT KNEE - COMPLETE 4+ VIEW COMPARISON:  None. FINDINGS: Diffuse osteopenia. Mild medial joint space narrowing. No fracture, no fracture or dislocation. Small effusion. Mild diffuse soft tissue swelling. Atheromatous arterial calcifications. IMPRESSION: 1. Small effusion. 2. No fracture or dislocation seen. 3. Mild medial joint space narrowing. 4. Atheromatous arterial calcifications. Electronically Signed   By: Claudie Revering M.D.   On: 12/18/2020 13:55   DG Foot Complete Left  Result Date: 12/19/2020 CLINICAL DATA:  65 year old female status post fall with severe pain. EXAM: LEFT FOOT - COMPLETE 3+ VIEW COMPARISON:  Left foot series 02/09/2012. FINDINGS: Possible chronic fracture of the 5th proximal phalanx since 2013. But no acute  osseous abnormality identified. Normal joint spaces and alignment in the left foot. Calcaneus appears intact on the cross-table lateral view. There is mild generalized soft tissue swelling. No soft tissue gas. IMPRESSION: No acute osseous abnormality identified about the left foot. Electronically Signed   By: Genevie Ann M.D.   On: 12/19/2020 10:01   DG Foot Complete Right  Result Date: 12/18/2020 CLINICAL DATA:  Fall onto right side.  Ankle pain. EXAM: RIGHT FOOT COMPLETE - 3+ VIEW COMPARISON:  Ankle radiographs same date. FINDINGS: The bones are demineralized. There is no evidence of acute fracture or dislocation. Toe evaluation is limited by positioning on the AP and oblique views. The toes appear unremarkable on the lateral view. No focal soft tissue swelling or foreign body identified in the foot. IMPRESSION: Osteopenia without acute osseous findings in the foot. Electronically Signed   By: Richardean Sale M.D.   On: 12/18/2020 15:14   ECHOCARDIOGRAM LIMITED  Result Date: 12/19/2020    ECHOCARDIOGRAM LIMITED REPORT   Patient Name:   MIYONNA TRINDLE Date of Exam: 12/19/2020 Medical Rec #:  [id number]         Height:       67.0 in Accession #:    [id number]        Weight:       130.0 lb Date of Birth:  [birth year]        BSA:          1.684 m Patient Age:    42 years  BP:           136/100 mmHg Patient Gender: F                 HR:           73 bpm. Exam Location:  Inpatient Procedure: Limited Echo, 3D Echo, Cardiac Doppler and Color Doppler Indications:    I50.40* Unspecified combined systolic (congestive) and diastolic                 (congestive) heart failure  History:        Patient has prior history of Echocardiogram examinations, most                 recent 08/19/2020. Apical variant hypertrophic cardiomyopathy and                 CHF, CAD, Abnormal ECG, Stroke, Arrythmias:Atrial Fibrillation;                 Risk Factors:Hypertension and Current Smoker.  Sonographer:    Roseanna Rainbow RDCS Referring  Phys: [id number] [place]  1. Left ventricular ejection fraction, by estimation, is 55%. Left ventricular ejection fraction by 3D volume is 52 %. The left ventricle has normal function. The left ventricle demonstrates regional wall motion abnormalities (see scoring diagram/findings for description). There is severe asymmetric left ventricular hypertrophy of the apical segment.  2. Right ventricular systolic function is mildly reduced. The right ventricular size is mildly enlarged. There is moderately elevated pulmonary artery systolic pressure. The estimated right ventricular systolic pressure is 123456 mmHg.  3. Left atrial size was severely dilated.  4. Right atrial size was moderately dilated.  5. The mitral valve is grossly normal. Mild mitral valve regurgitation.  6. Tricuspid valve regurgitation is moderate.  7. The aortic valve is grossly normal. Aortic valve regurgitation is not visualized.  8. Aortic dilatation noted. There is mild dilatation of the ascending aorta, measuring 44 mm.  9. The inferior vena cava is dilated in size with <50% respiratory variability, suggesting right atrial pressure of 15 mmHg. Comparison(s): A prior study was performed on 08/19/20. Prior images reviewed side by side. LVEF may be slightly decreased compared to prior, regional wall motion abnormalities may have been present on prior study, but more prominent today. FINDINGS  Left Ventricle: Left ventricular ejection fraction, by estimation, is 55%. Left ventricular ejection fraction by 3D volume is 52 %. The left ventricle has normal function. The left ventricle demonstrates regional wall motion abnormalities. There is severe asymmetric left ventricular hypertrophy of the apical segment.  LV Wall Scoring: The basal inferolateral segment is akinetic. The antero-lateral wall, mid inferolateral segment, and basal inferior segment are hypokinetic. Right Ventricle: The right ventricular size is mildly enlarged. Right  ventricular systolic function is mildly reduced. There is moderately elevated pulmonary artery systolic pressure. The tricuspid regurgitant velocity is 3.32 m/s, and with an assumed right atrial pressure of 15 mmHg, the estimated right ventricular systolic pressure is 123456 mmHg. Left Atrium: Left atrial size was severely dilated. Right Atrium: Right atrial size was moderately dilated. Mitral Valve: The mitral valve is grossly normal. Mild mitral valve regurgitation. Tricuspid Valve: Tricuspid valve regurgitation is moderate. Aortic Valve: The aortic valve is grossly normal. Aortic valve regurgitation is not visualized. Aorta: Aortic dilatation noted. There is mild dilatation of the ascending aorta, measuring 44 mm. Venous: The inferior vena cava is dilated in size with less than 50% respiratory variability, suggesting right atrial pressure of 15  mmHg. LEFT VENTRICLE PLAX 2D LVIDd:         4.40 cm LVIDs:         3.80 cm LV PW:         1.50 cm         3D Volume EF LV IVS:        1.10 cm         LV 3D EF:    Left LVOT diam:     1.80 cm                      ventricular LVOT Area:     2.54 cm                     ejection                                             fraction by                                             3D volume LV Volumes (MOD)                            is 52 %. LV vol d, MOD    53.4 ml A2C: LV vol d, MOD    46.9 ml       3D Volume EF: A4C:                           3D EF:        52 % LV vol s, MOD    22.2 ml A2C: LV vol s, MOD    21.3 ml A4C: LV SV MOD A2C:   31.2 ml LV SV MOD A4C:   46.9 ml LV SV MOD BP:    27.5 ml IVC IVC diam: 2.50 cm LEFT ATRIUM         Index LA diam:    2.50 cm 1.48 cm/m   AORTA Ao Root diam: 3.20 cm Ao Asc diam:  4.40 cm TRICUSPID VALVE TR Peak grad:   44.1 mmHg TR Vmax:        332.00 cm/s  SHUNTS Systemic Diam: 1.80 cm Cherlynn Kaiser MD Electronically signed by Cherlynn Kaiser MD Signature Date/Time: 12/19/2020/11:38:29 AM    Final

## 2020-12-22 NOTE — Progress Notes (Signed)
Physical Therapy Treatment Patient Details Name: Kimberly [name] MRN: [id number] DOB: March 08, 1956 Today's Date: 12/22/2020    History of Present Illness Pt presents to ED after near syncopal event with a fall at home. She reports intermittent lightheadedness with taking meds and a toothache recently. Has carpet burns on knees from crawling to phone for 911. PMH: paroxysmal A. fib on Eliquis, prior CVA x2, ascending aortic aneurysm, dilatation of infrarenal abdominal aorta, essential hypertension, hyperlipidemia, chronic anxiety/depression, GERD, tobacco use disorder, physical debility.    PT Comments    Pt admitted with above diagnosis. Pt was able to sit eOB x 5 min with min guard assist until she c/o dizziness and then had to lie down. Pt BP would not register upon lying down initially but when it did,it was 174/116.  Pt refused to get back to EOB.  C/o continued right ankle pain therefore placed ice on right ankle.  Will continue to follow and progress pt as able.  Pt currently with functional limitations due to the deficits listed below (see PT Problem List). Pt will benefit from skilled PT to increase their independence and safety with mobility to allow discharge to the venue listed below.     Follow Up Recommendations  SNF;Supervision/Assistance - 24 hour     Equipment Recommendations  None recommended by PT    Recommendations for Other Services OT consult (pt requests to speak to CSW)     Precautions / Restrictions Precautions Precautions: Fall Restrictions Other Position/Activity Restrictions: no restrictions listed in chart but pt with sprained L ankle    Mobility  Bed Mobility Overal bed mobility: Needs Assistance Bed Mobility: Supine to Sit;Sit to Supine Rolling: Mod assist   Supine to sit: Mod assist;HOB elevated Sit to supine: Max assist   General bed mobility comments: Mod A at trunk and BLE with use of chuck pad for supine <> EOB. Pt assist with scooting forward.   Max assist to lie down as pt was c/o dizziness after sitting EOB 5 min.    Transfers                 General transfer comment: Unable to tolerate weight through BLE even seated EOB this date. Transfers deferred 2/2 pain.  Ambulation/Gait             General Gait Details: unable   Stairs             Wheelchair Mobility    Modified Rankin (Stroke Patients Only)       Balance Overall balance assessment: Needs assistance;History of Falls Sitting-balance support: No upper extremity supported;Feet supported Sitting balance-Leahy Scale: Fair Sitting balance - Comments: able to maintain static sitting balance seated EOB with UE support on bed surface after some assist to get to midline.  Sat 42min and then c/o dizziness                                    Cognition Arousal/Alertness: Awake/alert Behavior During Therapy: Anxious Overall Cognitive Status: No family/caregiver present to determine baseline cognitive functioning                                 General Comments: A&O, follows 1-2 step verbal commands with good accuracy. Limited participation 2/2 pain with all movement (active and passive).      Exercises General Exercises - Lower  Extremity Ankle Circles/Pumps: AROM;Right;Supine;5 reps;Both Quad Sets: AROM;Both;5 reps;Supine Heel Slides: AAROM;Both;5 reps;Supine    General Comments General comments (skin integrity, edema, etc.): VSs- tried to get pts BP when she was dizzy but it only measured a MAP of 104.  Took BP again after lying down a few min and it registered 174/116.      Pertinent Vitals/Pain Pain Assessment: Faces Faces Pain Scale: Hurts whole lot Pain Location: UEs and LES Pain Descriptors / Indicators: Aching;Moaning;Sharp Pain Intervention(s): Limited activity within patient's tolerance;Monitored during session;Repositioned;Premedicated before session;Ice applied    Home Living                       Prior Function            PT Goals (current goals can now be found in the care plan section) Acute Rehab PT Goals Patient Stated Goal: Agreeable to rehab. Progress towards PT goals: Not progressing toward goals - comment (still limited by right ankle pain/generalized pain)    Frequency    Min 2X/week      PT Plan Current plan remains appropriate    Co-evaluation              AM-PAC PT "6 Clicks" Mobility   Outcome Measure  Help needed turning from your back to your side while in a flat bed without using bedrails?: A Lot Help needed moving from lying on your back to sitting on the side of a flat bed without using bedrails?: A Lot Help needed moving to and from a bed to a chair (including a wheelchair)?: Total Help needed standing up from a chair using your arms (e.g., wheelchair or bedside chair)?: Total Help needed to walk in hospital room?: Total Help needed climbing 3-5 steps with a railing? : Total 6 Click Score: 8    End of Session   Activity Tolerance: Patient limited by pain;Patient limited by fatigue Patient left: in bed;with call bell/phone within reach;with bed alarm set Nurse Communication: Mobility status PT Visit Diagnosis: History of falling (Z91.81);Muscle weakness (generalized) (M62.81);Pain Pain - part of body: Knee;Ankle and joints of foot     Time: 0600-4599 PT Time Calculation (min) (ACUTE ONLY): 22 min  Charges:  $Therapeutic Activity: 8-22 mins                     Kimberly [name] M,PT Acute Rehab Services [phone] (pager)   Alvira Philips 12/22/2020, 1:02 PM

## 2020-12-23 ENCOUNTER — Inpatient Hospital Stay (HOSPITAL_COMMUNITY): Payer: Medicaid Other

## 2020-12-23 DIAGNOSIS — W19XXXA Unspecified fall, initial encounter: Secondary | ICD-10-CM

## 2020-12-23 MED ORDER — HYDRALAZINE HCL 50 MG PO TABS
50.0000 mg | ORAL_TABLET | Freq: Three times a day (TID) | ORAL | Status: DC
  Administered 2020-12-23 – 2020-12-24 (×3): 50 mg via ORAL
  Filled 2020-12-23 (×3): qty 1

## 2020-12-23 MED ORDER — BISACODYL 10 MG RE SUPP
10.0000 mg | Freq: Once | RECTAL | Status: DC
  Filled 2020-12-23: qty 1

## 2020-12-23 MED ORDER — POLYETHYLENE GLYCOL 3350 17 G PO PACK
17.0000 g | PACK | Freq: Two times a day (BID) | ORAL | Status: DC
  Administered 2020-12-23 – 2020-12-25 (×4): 17 g via ORAL
  Filled 2020-12-23 (×4): qty 1

## 2020-12-23 MED ORDER — DOCUSATE SODIUM 100 MG PO CAPS
100.0000 mg | ORAL_CAPSULE | Freq: Two times a day (BID) | ORAL | Status: DC
  Administered 2020-12-23 – 2020-12-25 (×5): 100 mg via ORAL
  Filled 2020-12-23 (×5): qty 1

## 2020-12-23 NOTE — Progress Notes (Signed)
Patient states she has not had bowel movement in the past 3 days despite laxatives. She refuses to get out of bed and move around because "it hurts.'

## 2020-12-23 NOTE — Progress Notes (Signed)
Orthopedic Tech Progress Note Patient Details:  Kimberly [name] [birth year] [id number] Ordered brace Patient ID: Kimberly [name], female   DOB: [birth year], 65 y.o.   MRN: [id number]   Kimberly [name] 12/23/2020, 12:27 PM

## 2020-12-23 NOTE — Progress Notes (Signed)
PROGRESS NOTE                                                                                                                                                                                                             Patient Demographics:    Kimberly [name], is a 65 y.o. female, DOB - Oct 22, 1955, UUV:[id number]  Outpatient Primary MD for the patient is [name], FNP    LOS - 5  Admit date - 12/18/2020    No chief complaint on file.      Brief Narrative (HPI from H&P) - Kimberly [name] is a 65 y.o. female with medical history significant for paroxysmal A. fib on Eliquis, prior CVA x2, ascending aortic aneurysm, dilatation of infrarenal abdominal aorta, essential hypertension, hyperlipidemia, chronic anxiety/depression, GERD, tobacco use disorder, physical debility, recently prescribed antibiotics, penicillin on 12/17/2020 for dental caries, who presented to [facility] ED from home after a near syncope event and a fall at home, in the ER she was diagnosed with dehydration, hypotension there was question of sepsis of unclear origin and were admitted to the hospital.   Subjective:   Patient in bed, appears comfortable, denies any headache, no fever, no chest pain or pressure, no shortness of breath , no abdominal pain. No new focal weakness, she does feel constipated, improving left foot and toe pain.   Assessment  & Plan :      1. Near syncope in the setting of dehydration, possible sepsis - she was recently treated for dental caries and UTI, does not appear toxic, has been started on vancomycin and cefepime upon admission and we will continue until her cultures are negative, sepsis pathophysiology has resolved, she has been adequately hydrated with IV fluids and monitor final cultures and trend procalcitonin negative till 12/22/20.  We will also get PT OT involved and advance activity as tolerated. Note patient has reported history  of paroxysmal dizziness.  Continue home dose meclizine. Post Dc Dental follow up.  2.  Acute hypoxic respiratory failure - resolved currently symptom-free, most likely was due to transient atelectasis, we will continue to monitor closely.  3.  History of CAD, paroxysmal A. fib, CHA2DS2-VASc 2 score of greater than 4.  She is chest pain-free.  Her troponin rise is in non-ACS pattern, echo noted with EF 55% and possibly old wall motion abnormalities.  Case discussed with cardiologist Dr. Sallyanne Kuster - outpatient follow-up and further work-up if needed.  Continue combination of Eliquis, statin and Coreg for now.  4.  History of CVA with chronic left-sided hemiparesis.  Continue Eliquis and statin for secondary prevention.  5.    Left foot and toe discomfort.  Extremely tender to touch highly suspicious for gout, stable uric acid level and x-rays likely uric acid normal due to consumption, much improved after a dose of Solu-Medrol and colchicine which will be continued.  Advance activity, clinically better continue to improve likely discharge on 5/12, refused SNF now.   6.  History of smoking and COPD.  Stable supportive care counseled to quit smoking.  7.  Chronic microcytic anemia.  Monitor outpatient follow-up with PCP.  8.  History of ascending aortic aneurysm.  Outpatient cardiology follow-up.  9.  Lung nodule noted on CT chest.  She is a smoker we will have her follow-up with pulmonary outpatient for repeat CT in 12 months.  10.  Moderate pulmonary artery hypertension.  Supportive care outpatient pulmonary follow-up.  11.  Hypomagnesemia.  Replaced again IV.   12.  Constipation.  Placed on aggressive bowel regimen.      Condition -  Faie  Family Communication  : Deon Pilling [phone] updated on 12/19/2020, 12/21/20  Code Status :  Full  Consults  :  Cards  PUD Prophylaxis : PPI   Procedures  :     TTE - 1. Left ventricular ejection fraction, by estimation, is 55%. Left  ventricular ejection fraction by 3D volume is 52 %. The left ventricle has normal function. The left ventricle demonstrates regional wall motion abnormalities (see scoring diagram/findings for description). There is severe asymmetric left ventricular hypertrophy of the apical segment.  2. Right ventricular systolic function is mildly reduced. The right ventricular size is mildly enlarged. There is moderately elevated pulmonary artery systolic pressure. The estimated right ventricular systolic pressure is 123456 mmHg.  3. Left atrial size was severely dilated.  4. Right atrial size was moderately dilated.  5. The mitral valve is grossly normal. Mild mitral valve regurgitation.  6. Tricuspid valve regurgitation is moderate.  7. The aortic valve is grossly normal. Aortic valve regurgitation is not visualized.  8. Aortic dilatation noted. There is mild dilatation of the ascending aorta, measuring 44 mm.  9. The inferior vena cava is dilated in size with <50% respiratory variability, suggesting right atrial pressure of 15 mmHg. Comparison(s): A prior study was performed on 08/19/20. Prior images reviewed side by side. LVEF may be slightly decreased compared to prior, regional wall motion abnormalities may have been present on prior study, but more prominent today.   CT Head & C Spine - Non acute   CT Chest -  1. Mild to moderate severity bilateral lower lobe linear scarring and/or atelectasis with a small noncalcified nodular component within the left lung base. No follow-up needed if patient is low-risk. Non-contrast chest CT can be considered in 12 months if patient is high-risk.      Disposition Plan  :    Status is: Inpatient  Remains inpatient appropriate because:IV treatments appropriate due to intensity of illness or inability to take PO  Dispo:  Patient From: Home  Planned Disposition: Home  Medically stable for discharge: No    DVT Prophylaxis  :    apixaban (ELIQUIS) tablet 5 mg     Lab  Results  Component Value Date   PLT 187 12/21/2020    Diet :  Diet Order            Diet Heart Room service appropriate? Yes; Fluid consistency: Thin  Diet effective now                  Inpatient Medications  Scheduled Meds: . amLODipine  10 mg Oral Daily  . apixaban  5 mg Oral BID  . atorvastatin  80 mg Oral Daily  . bisacodyl  10 mg Rectal Once  . carvedilol  6.25 mg Oral BID WC  . docusate sodium  100 mg Oral BID  . doxycycline  100 mg Oral Q12H  . ferrous sulfate  325 mg Oral Q breakfast  . hydrALAZINE  50 mg Oral Q8H  . methylPREDNISolone (SOLU-MEDROL) injection  40 mg Intravenous Q12H  . mirtazapine  15 mg Oral QHS  . nicotine  14 mg Transdermal Daily  . pantoprazole  40 mg Oral Daily  . polyethylene glycol  17 g Oral BID   Continuous Infusions:  PRN Meds:.acetaminophen, ipratropium-albuterol, meclizine, senna-docusate, traMADol  Antibiotics  :    Anti-infectives (From admission, onward)   Start     Dose/Rate Route Frequency Ordered Stop   12/22/20 1000  doxycycline (VIBRA-TABS) tablet 100 mg        100 mg Oral Every 12 hours 12/21/20 1123 12/25/20 0959   12/19/20 1000  ceFEPIme (MAXIPIME) 2 g in sodium chloride 0.9 % 100 mL IVPB        2 g 200 mL/hr over 30 Minutes Intravenous Every 12 hours 12/18/20 1638 12/22/20 0929   12/19/20 0600  vancomycin (VANCOREADY) IVPB 500 mg/100 mL        500 mg 100 mL/hr over 60 Minutes Intravenous Every 12 hours 12/18/20 1638 12/21/20 2359   12/18/20 1630  ceFEPIme (MAXIPIME) 2 g in sodium chloride 0.9 % 100 mL IVPB        2 g 200 mL/hr over 30 Minutes Intravenous  Once 12/18/20 1629 12/18/20 1947   12/18/20 1630  metroNIDAZOLE (FLAGYL) IVPB 500 mg        500 mg 100 mL/hr over 60 Minutes Intravenous  Once 12/18/20 1629 12/18/20 2138   12/18/20 1630  vancomycin (VANCOREADY) IVPB 1000 mg/200 mL        1,000 mg 200 mL/hr over 60 Minutes Intravenous  Once 12/18/20 1629 12/18/20 2321       Time Spent in minutes   30   Lala Lund M.D on 12/23/2020 at 11:37 AM  To page go to [email or web address]   [facility] -  Office  [phone]    See all Orders from today for further details    Objective:   Vitals:   12/22/20 1218 12/22/20 1750 12/22/20 1942 12/23/20 0846  BP: (!) 149/95 (!) 136/96 (!) 132/95 (!) 180/110  Pulse: 64 68 67 66  Resp: 16  18   Temp: 98 F (36.7 C)  98.2 F (36.8 C) 98.6 F (37 C)  TempSrc: Oral  Oral Oral  SpO2: 97%  100% 95%  Weight:      Height:        Wt Readings from Last 3 Encounters:  12/18/20 59 kg  10/14/20 54.6 kg  09/30/20 53.5 kg     Intake/Output Summary (Last 24 hours) at 12/23/2020 1137 Last data filed at 12/23/2020 1107 Gross per 24 hour  Intake 1125.48 ml  Output 1900 ml  Net -774.52 ml     Physical Exam  Awake Alert, chronic left-sided hemiparesis,  R Foot in Proform boot,  some L. Foot mild swelling, tenderness to palpate in all left foot toes. [place].AT,PERRAL Supple Neck,No JVD, No cervical lymphadenopathy appriciated.  Symmetrical Chest wall movement, Good air movement bilaterally, CTAB RRR,No Gallops, Rubs or new Murmurs, No Parasternal Heave +ve B.Sounds, Abd Soft, No tenderness, No organomegaly appriciated, No rebound - guarding or rigidity. No Cyanosis, Clubbing or edema, No new Rash or bruise     Data Review:    CBC Recent Labs  Lab 12/18/20 1435 12/19/20 0328 12/20/20 0416 12/21/20 0251  WBC 13.7* 10.1 7.5 11.4*  HGB 11.4* 10.1* 10.0* 10.0*  HCT 36.8 31.5* 31.7* 31.7*  PLT 229 191 179 187  MCV 71.0* 69.7* 70.0* 70.8*  MCH 22.0* 22.3* 22.1* 22.3*  MCHC 31.0 32.1 31.5 31.5  RDW 16.6* 16.3* 16.5* 17.0*  LYMPHSABS 1.8  --  1.8 1.3  MONOABS 0.7  --  0.4 0.5  EOSABS 0.0  --  0.3 0.0  BASOSABS 0.0  --  0.1 0.0    Recent Labs  Lab 12/18/20 1320 12/18/20 1430 12/18/20 1435 12/18/20 2232 12/19/20 0328 12/20/20 0416 12/21/20 0251  NA  --   --  142  --  138 139 135  K  --   --  3.6  --  3.1* 3.7 3.8  CL   --   --  111  --  111 112* 108  CO2  --   --  21*  --  20* 22 21*  GLUCOSE  --   --  107*  --  111* 96 141*  BUN  --   --  21  --  15 12 17   CREATININE  --   --  0.92  --  0.86 0.80 1.09*  CALCIUM  --   --  9.6  --  8.8* 8.7* 8.5*  AST  --   --  48*  --  33 25 17  ALT  --   --  57*  --  44 36 33  ALKPHOS  --   --  116  --  89 93 90  BILITOT  --   --  0.7  --  0.7 0.4 0.6  ALBUMIN  --   --  3.9  --  3.2* 2.8* 2.6*  MG  --   --   --   --  1.6* 1.8 1.6*  PROCALCITON  --   --   --   --   --  2.67 1.60  LATICACIDVEN 4.5*  --   --  3.4* 1.6  --   --   INR  --  1.6*  --   --   --   --   --   BNP  --   --   --   --  1,460.4* 705.5* 1,182.6*    ------------------------------------------------------------------------------------------------------------------ No results for input(s): CHOL, HDL, LDLCALC, TRIG, CHOLHDL, LDLDIRECT in the last 72 hours.  Lab Results  Component Value Date   HGBA1C 5.5 09/02/2019   ------------------------------------------------------------------------------------------------------------------ No results for input(s): TSH, T4TOTAL, T3FREE, THYROIDAB in the last 72 hours.  Invalid input(s): FREET3  Cardiac Enzymes Recent Labs  Lab 12/18/20 1435  CKMB 7.9*   ------------------------------------------------------------------------------------------------------------------    Component Value Date/Time   BNP 1,182.6 (H) 12/21/2020 0251    Micro Results Recent Results (from the past 240 hour(s))  Urine culture     Status: None   Collection Time: 12/18/20  6:07 PM   Specimen: In/Out Cath Urine  Result Value Ref Range Status   Specimen Description IN/OUT  CATH URINE  Final   Special Requests NONE  Final   Culture   Final    NO GROWTH Performed at [facility], [address], [place], [place] [address]    Report Status 12/19/2020 FINAL  Final  Resp Panel by RT-PCR (Flu A&B, Covid) Nasopharyngeal Swab     Status: None   Collection Time: 12/18/20   7:59 PM   Specimen: Nasopharyngeal Swab; Nasopharyngeal(NP) swabs in vial transport medium  Result Value Ref Range Status   SARS Coronavirus 2 by RT PCR NEGATIVE NEGATIVE Final    Comment: (NOTE) SARS-CoV-2 target nucleic acids are NOT DETECTED.  The SARS-CoV-2 RNA is generally detectable in upper respiratory specimens during the acute phase of infection. The lowest concentration of SARS-CoV-2 viral copies this assay can detect is 138 copies/mL. A negative result does not preclude SARS-Cov-2 infection and should not be used as the sole basis for treatment or other patient management decisions. A negative result may occur with  improper specimen collection/handling, submission of specimen other than nasopharyngeal swab, presence of viral mutation(s) within the areas targeted by this assay, and inadequate number of viral copies(<138 copies/mL). A negative result must be combined with clinical observations, patient history, and epidemiological information. The expected result is Negative.  Fact Sheet for Patients:  BloggerCourse.com  Fact Sheet for Healthcare Providers:  SeriousBroker.it  This test is no t yet approved or cleared by the [place] FDA and  has been authorized for detection and/or diagnosis of SARS-CoV-2 by FDA under an Emergency Use Authorization (EUA). This EUA will remain  in effect (meaning this test can be used) for the duration of the COVID-19 declaration under Section 564(b)(1) of the Act, 21 U.S.C.section 360bbb-3(b)(1), unless the authorization is terminated  or revoked sooner.       Influenza A by PCR NEGATIVE NEGATIVE Final   Influenza B by PCR NEGATIVE NEGATIVE Final    Comment: (NOTE) The Xpert Xpress SARS-CoV-2/FLU/RSV plus assay is intended as an aid in the diagnosis of influenza from Nasopharyngeal swab specimens and should not be used as a sole basis for treatment. Nasal washings and aspirates  are unacceptable for Xpert Xpress SARS-CoV-2/FLU/RSV testing.  Fact Sheet for Patients: BloggerCourse.com  Fact Sheet for Healthcare Providers: SeriousBroker.it  This test is not yet approved or cleared by the [place] FDA and has been authorized for detection and/or diagnosis of SARS-CoV-2 by FDA under an Emergency Use Authorization (EUA). This EUA will remain in effect (meaning this test can be used) for the duration of the COVID-19 declaration under Section 564(b)(1) of the Act, 21 U.S.C. section 360bbb-3(b)(1), unless the authorization is terminated or revoked.  Performed at [facility], [address], [place], [place] [address]   Blood Culture (routine x 2)     Status: None (Preliminary result)   Collection Time: 12/19/20  3:28 AM   Specimen: BLOOD RIGHT HAND  Result Value Ref Range Status   Specimen Description BLOOD RIGHT HAND  Final   Special Requests   Final    BOTTLES DRAWN AEROBIC ONLY Blood Culture results may not be optimal due to an inadequate volume of blood received in culture bottles   Culture   Final    NO GROWTH 4 DAYS Performed at [facility], [address], [place], [place] [address]    Report Status PENDING  Incomplete  Blood Culture (routine x 2)     Status: None (Preliminary result)   Collection Time: 12/19/20  3:28 AM   Specimen: BLOOD  Result Value Ref Range Status   Specimen Description BLOOD SITE NOT SPECIFIED  Final   Special Requests   Final    BOTTLES DRAWN AEROBIC ONLY Blood Culture results may not be optimal due to an inadequate volume of blood received in culture bottles   Culture   Final    NO GROWTH 4 DAYS Performed at [facility], [place] [address] [place], [place] [address]    Report Status PENDING  Incomplete    Radiology Reports DG Tibia/Fibula Right  Result Date: 12/18/2020 CLINICAL DATA:  Fall with right lower leg pain. EXAM: RIGHT TIBIA AND FIBULA - 2  VIEW COMPARISON:  Knee and ankle radiographs same date. FINDINGS: The bones are demineralized. No evidence of acute fracture or dislocation. Mild medial compartment joint space narrowing at the knee, scattered vascular calcifications and diffuse soft tissue edema, greatest distally, are again noted. IMPRESSION: No acute osseous findings identified within the right lower leg. Diffuse soft tissue edema/swelling. Electronically Signed   By: Richardean Sale M.D.   On: 12/18/2020 15:16   DG Ankle Complete Left  Result Date: 12/19/2020 CLINICAL DATA:  65 year old female status post fall with severe pain. EXAM: LEFT ANKLE COMPLETE - 3+ VIEW COMPARISON:  Left foot series today. FINDINGS: Soft tissue swelling appears greater on the medial aspect of the ankle. No joint effusion on cross-table lateral view. Preserved mortise joint alignment. Talar dome intact. No acute osseous abnormality identified. IMPRESSION: Soft tissue swelling with no acute fracture or dislocation identified about the left ankle. Electronically Signed   By: Genevie Ann M.D.   On: 12/19/2020 10:02   DG Ankle Complete Right  Result Date: 12/18/2020 CLINICAL DATA:  Fall EXAM: RIGHT ANKLE - COMPLETE 3+ VIEW COMPARISON:  None. FINDINGS: The bones appear diffusely demineralized. No evidence of acute fracture or malalignment. Generalized soft tissue swelling present about the ankle and lower leg. Trace atherosclerotic vascular calcifications. IMPRESSION: Soft tissue swelling about the ankle and lower leg without evidence of acute fracture or malalignment. The bones appear demineralized suggesting underlying osteoporosis. Electronically Signed   By: Jacqulynn Cadet M.D.   On: 12/18/2020 13:52   CT Head Wo Contrast  Result Date: 12/18/2020 CLINICAL DATA:  [place] this morning. Left-sided weakness from a previous stroke. Taking blood thinners. EXAM: CT HEAD WITHOUT CONTRAST CT CERVICAL SPINE WITHOUT CONTRAST TECHNIQUE: Multidetector CT imaging of the head and  cervical spine was performed following the standard protocol without intravenous contrast. Multiplanar CT image reconstructions of the cervical spine were also generated. COMPARISON:  Head CT dated 04/25/2020. Cervical spine CT dated 02/13/2018. FINDINGS: CT HEAD FINDINGS Brain: Stable old right occipital lobe infarct. Stable old right thalamic lacunar infarct. Stable mildly enlarged ventricles and cortical sulci. Mild-to-moderate patchy white matter low density in both cerebral hemispheres without significant change. No intracranial hemorrhage, mass lesion or CT evidence of acute infarction. Vascular: Dense bilateral cavernous carotid atheromatous calcifications. Skull: Normal. Negative for fracture or focal lesion. Sinuses/Orbits: Unremarkable. Other: None. CT CERVICAL SPINE FINDINGS Alignment: Mild reversal of the normal cervical lordosis without significant change. No subluxations. Skull base and vertebrae: No acute fracture. No primary bone lesion or focal pathologic process. Soft tissues and spinal canal: No prevertebral fluid or swelling. No visible canal hematoma. Disc levels:  Multilevel degenerative changes with little change. Upper chest: Mild biapical bullous changes. Other: Dense bilateral carotid artery calcifications. IMPRESSION: 1. No skull fracture or intracranial hemorrhage. 2. No cervical spine fracture or subluxation.  3. Stable cerebral atrophy, chronic small vessel white matter ischemic changes, old right occipital lobe infarct and old right thalamic lacunar infarct. 4. Stable cervical spine degenerative changes. Electronically Signed   By: Claudie Revering M.D.   On: 12/18/2020 16:06   CT CHEST WO CONTRAST  Result Date: 12/18/2020 CLINICAL DATA:  Possible sepsis. EXAM: CT CHEST WITHOUT CONTRAST TECHNIQUE: Multidetector CT imaging of the chest was performed following the standard protocol without IV contrast. COMPARISON:  September 21, 2019 FINDINGS: Cardiovascular: There is marked severity  calcification of the aortic arch and descending thoracic aorta. The ascending thoracic aorta measures approximately 4.8 cm x 4.2 cm. Stable pulmonary artery enlargement is seen. There is moderate severity cardiomegaly with marked severity coronary artery calcification. No pericardial effusion. Mediastinum/Nodes: No enlarged mediastinal or axillary lymph nodes. Thyroid gland, trachea, and esophagus demonstrate no significant findings. Lungs/Pleura: Mild to moderate severity areas of scarring and/or linear atelectasis are seen within the bilateral lower lobes. A 5 mm noncalcified nodular component is seen within the posteromedial aspect of the left lung base (axial CT image 118, CT series number 4). This represents a new finding when compared to the prior exam. There is no evidence of a pleural effusion or pneumothorax. Upper Abdomen: No acute abnormality. Musculoskeletal: Multiple sternal wires are present. No acute osseous abnormalities are identified. IMPRESSION: 1. Mild to moderate severity bilateral lower lobe linear scarring and/or atelectasis with a small noncalcified nodular component within the left lung base. No follow-up needed if patient is low-risk. Non-contrast chest CT can be considered in 12 months if patient is high-risk. This recommendation follows the consensus statement: Guidelines for Management of Incidental Pulmonary Nodules Detected on CT Images: From the Fleischner Society 2017; Radiology 2017; 284:228-243. 2. 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 3. Additional stable findings suggestive of pulmonary arterial hypertension. Electronically Signed   By: Virgina Norfolk M.D.   On: 12/18/2020 18:39   CT Cervical Spine Wo Contrast  Result Date: 12/18/2020 CLINICAL DATA:  [place] this morning. Left-sided weakness from a previous stroke. Taking blood thinners. EXAM: CT HEAD WITHOUT CONTRAST CT CERVICAL SPINE WITHOUT CONTRAST TECHNIQUE: Multidetector CT imaging of the head and  cervical spine was performed following the standard protocol without intravenous contrast. Multiplanar CT image reconstructions of the cervical spine were also generated. COMPARISON:  Head CT dated 04/25/2020. Cervical spine CT dated 02/13/2018. FINDINGS: CT HEAD FINDINGS Brain: Stable old right occipital lobe infarct. Stable old right thalamic lacunar infarct. Stable mildly enlarged ventricles and cortical sulci. Mild-to-moderate patchy white matter low density in both cerebral hemispheres without significant change. No intracranial hemorrhage, mass lesion or CT evidence of acute infarction. Vascular: Dense bilateral cavernous carotid atheromatous calcifications. Skull: Normal. Negative for fracture or focal lesion. Sinuses/Orbits: Unremarkable. Other: None. CT CERVICAL SPINE FINDINGS Alignment: Mild reversal of the normal cervical lordosis without significant change. No subluxations. Skull base and vertebrae: No acute fracture. No primary bone lesion or focal pathologic process. Soft tissues and spinal canal: No prevertebral fluid or swelling. No visible canal hematoma. Disc levels:  Multilevel degenerative changes with little change. Upper chest: Mild biapical bullous changes. Other: Dense bilateral carotid artery calcifications. IMPRESSION: 1. No skull fracture or intracranial hemorrhage. 2. No cervical spine fracture or subluxation. 3. Stable cerebral atrophy, chronic small vessel white matter ischemic changes, old right occipital lobe infarct and old right thalamic lacunar infarct. 4. Stable cervical spine degenerative changes. Electronically Signed   By: Claudie Revering M.D.   On: 12/18/2020 16:06  DG Pelvis Portable  Result Date: 12/18/2020 CLINICAL DATA:  Patient fell. EXAM: PORTABLE PELVIS 1-2 VIEWS COMPARISON:  Pelvic CT 08/19/2020. FINDINGS: The bones appear mildly demineralized. No evidence of acute fracture, dislocation or femoral head avascular necrosis. The hip joint spaces are preserved. There are  mild sacroiliac degenerative changes bilaterally. Diffuse vascular calcifications are noted. IMPRESSION: No acute osseous findings. Electronically Signed   By: Richardean Sale M.D.   On: 12/18/2020 15:12   DG Chest Port 1 View  Result Date: 12/18/2020 CLINICAL DATA:  [place].  Smoker. EXAM: PORTABLE CHEST 1 VIEW COMPARISON:  08/18/2020 FINDINGS: Stable enlarged cardiac silhouette and post CABG changes. The aorta remains tortuous and partially calcified. Clear lungs. The lungs remain mildly hyperexpanded. Unremarkable bones. IMPRESSION: 1. No acute abnormality. 2. Stable cardiomegaly and changes of COPD. Electronically Signed   By: Claudie Revering M.D.   On: 12/18/2020 15:12   DG Knee Complete 4 Views Left  Result Date: 12/18/2020 CLINICAL DATA:  Left knee pain following a fall today. EXAM: LEFT KNEE - COMPLETE 4+ VIEW COMPARISON:  03/24/2018 FINDINGS: The bones appear osteopenic. No fracture, dislocation or effusion. Atheromatous arterial calcifications. IMPRESSION: 1. No fracture. 2. Atheromatous arterial calcifications. Electronically Signed   By: Claudie Revering M.D.   On: 12/18/2020 13:53   DG Knee Complete 4 Views Right  Result Date: 12/18/2020 CLINICAL DATA:  Right knee pain following a fall today. EXAM: RIGHT KNEE - COMPLETE 4+ VIEW COMPARISON:  None. FINDINGS: Diffuse osteopenia. Mild medial joint space narrowing. No fracture, no fracture or dislocation. Small effusion. Mild diffuse soft tissue swelling. Atheromatous arterial calcifications. IMPRESSION: 1. Small effusion. 2. No fracture or dislocation seen. 3. Mild medial joint space narrowing. 4. Atheromatous arterial calcifications. Electronically Signed   By: Claudie Revering M.D.   On: 12/18/2020 13:55   DG Foot Complete Left  Result Date: 12/19/2020 CLINICAL DATA:  65 year old female status post fall with severe pain. EXAM: LEFT FOOT - COMPLETE 3+ VIEW COMPARISON:  Left foot series 02/09/2012. FINDINGS: Possible chronic fracture of the 5th proximal  phalanx since 2013. But no acute osseous abnormality identified. Normal joint spaces and alignment in the left foot. Calcaneus appears intact on the cross-table lateral view. There is mild generalized soft tissue swelling. No soft tissue gas. IMPRESSION: No acute osseous abnormality identified about the left foot. Electronically Signed   By: Genevie Ann M.D.   On: 12/19/2020 10:01   DG Foot Complete Right  Result Date: 12/18/2020 CLINICAL DATA:  Fall onto right side.  Ankle pain. EXAM: RIGHT FOOT COMPLETE - 3+ VIEW COMPARISON:  Ankle radiographs same date. FINDINGS: The bones are demineralized. There is no evidence of acute fracture or dislocation. Toe evaluation is limited by positioning on the AP and oblique views. The toes appear unremarkable on the lateral view. No focal soft tissue swelling or foreign body identified in the foot. IMPRESSION: Osteopenia without acute osseous findings in the foot. Electronically Signed   By: Richardean Sale M.D.   On: 12/18/2020 15:14   ECHOCARDIOGRAM LIMITED  Result Date: 12/19/2020    ECHOCARDIOGRAM LIMITED REPORT   Patient Name:   Kimberly [name] Date of Exam: 12/19/2020 Medical Rec #:  [id number]         Height:       67.0 in Accession #:    [id number]        Weight:       130.0 lb Date of Birth:  18-Jun-1956        BSA:  1.684 m Patient Age:    65 years          BP:           136/100 mmHg Patient Gender: F                 HR:           73 bpm. Exam Location:  Inpatient Procedure: Limited Echo, 3D Echo, Cardiac Doppler and Color Doppler Indications:    I50.40* Unspecified combined systolic (congestive) and diastolic                 (congestive) heart failure  History:        Patient has prior history of Echocardiogram examinations, most                 recent 08/19/2020. Apical variant hypertrophic cardiomyopathy and                 CHF, CAD, Abnormal ECG, Stroke, Arrythmias:Atrial Fibrillation;                 Risk Factors:Hypertension and Current Smoker.   Sonographer:    Roseanna Rainbow RDCS Referring Phys: [id number] Margaree Mackintosh [employer]  1. Left ventricular ejection fraction, by estimation, is 55%. Left ventricular ejection fraction by 3D volume is 52 %. The left ventricle has normal function. The left ventricle demonstrates regional wall motion abnormalities (see scoring diagram/findings for description). There is severe asymmetric left ventricular hypertrophy of the apical segment.  2. Right ventricular systolic function is mildly reduced. The right ventricular size is mildly enlarged. There is moderately elevated pulmonary artery systolic pressure. The estimated right ventricular systolic pressure is 18.8 mmHg.  3. Left atrial size was severely dilated.  4. Right atrial size was moderately dilated.  5. The mitral valve is grossly normal. Mild mitral valve regurgitation.  6. Tricuspid valve regurgitation is moderate.  7. The aortic valve is grossly normal. Aortic valve regurgitation is not visualized.  8. Aortic dilatation noted. There is mild dilatation of the ascending aorta, measuring 44 mm.  9. The inferior vena cava is dilated in size with <50% respiratory variability, suggesting right atrial pressure of 15 mmHg. Comparison(s): A prior study was performed on 08/19/20. Prior images reviewed side by side. LVEF may be slightly decreased compared to prior, regional wall motion abnormalities may have been present on prior study, but more prominent today. FINDINGS  Left Ventricle: Left ventricular ejection fraction, by estimation, is 55%. Left ventricular ejection fraction by 3D volume is 52 %. The left ventricle has normal function. The left ventricle demonstrates regional wall motion abnormalities. There is severe asymmetric left ventricular hypertrophy of the apical segment.  LV Wall Scoring: The basal inferolateral segment is akinetic. The antero-lateral wall, mid inferolateral segment, and basal inferior segment are hypokinetic. Right Ventricle: The right  ventricular size is mildly enlarged. Right ventricular systolic function is mildly reduced. There is moderately elevated pulmonary artery systolic pressure. The tricuspid regurgitant velocity is 3.32 m/s, and with an assumed right atrial pressure of 15 mmHg, the estimated right ventricular systolic pressure is 41.6 mmHg. Left Atrium: Left atrial size was severely dilated. Right Atrium: Right atrial size was moderately dilated. Mitral Valve: The mitral valve is grossly normal. Mild mitral valve regurgitation. Tricuspid Valve: Tricuspid valve regurgitation is moderate. Aortic Valve: The aortic valve is grossly normal. Aortic valve regurgitation is not visualized. Aorta: Aortic dilatation noted. There is mild dilatation of the ascending aorta, measuring 44 mm. Venous: The inferior  vena cava is dilated in size with less than 50% respiratory variability, suggesting right atrial pressure of 15 mmHg. LEFT VENTRICLE PLAX 2D LVIDd:         4.40 cm LVIDs:         3.80 cm LV PW:         1.50 cm         3D Volume EF LV IVS:        1.10 cm         LV 3D EF:    Left LVOT diam:     1.80 cm                      ventricular LVOT Area:     2.54 cm                     ejection                                             fraction by                                             3D volume LV Volumes (MOD)                            is 52 %. LV vol d, MOD    53.4 ml A2C: LV vol d, MOD    46.9 ml       3D Volume EF: A4C:                           3D EF:        52 % LV vol s, MOD    22.2 ml A2C: LV vol s, MOD    21.3 ml A4C: LV SV MOD A2C:   31.2 ml LV SV MOD A4C:   46.9 ml LV SV MOD BP:    27.5 ml IVC IVC diam: 2.50 cm LEFT ATRIUM         Index LA diam:    2.50 cm 1.48 cm/m   AORTA Ao Root diam: 3.20 cm Ao Asc diam:  4.40 cm TRICUSPID VALVE TR Peak grad:   44.1 mmHg TR Vmax:        332.00 cm/s  SHUNTS Systemic Diam: 1.80 cm Cherlynn Kaiser MD Electronically signed by Cherlynn Kaiser MD Signature Date/Time: 12/19/2020/11:38:29 AM    Final

## 2020-12-23 NOTE — Progress Notes (Signed)
Occupational Therapy Treatment Patient Details Name: Kimberly [name] MRN: [id number] DOB: 12/22/1955 Today's Date: 12/23/2020    History of present illness Pt presents to ED after near syncopal event with a fall at home. She reports intermittent lightheadedness with taking meds and a toothache recently. Has carpet burns on knees from crawling to phone for 911. PMH: paroxysmal A. fib on Eliquis, prior CVA x2, ascending aortic aneurysm, dilatation of infrarenal abdominal aorta, essential hypertension, hyperlipidemia, chronic anxiety/depression, GERD, tobacco use disorder, physical debility.   OT comments  Pt making gains today with bed mobility, sit<>stand at RW level, and stand pivot transfers with RW. PTA pt was able to stand on her own, ambulate on her own, help with bathing/dressing, and toilet on her own. Currently she is not at her baseline level of function and is home alone during the day except for 2 1/2 hours. Feel she it would best and safest for her to go to SNF where she would get more rehab each day/week.   Follow Up Recommendations  SNF;Supervision/Assistance - 24 hour    Equipment Recommendations  None recommended by OT       Precautions / Restrictions Precautions Precautions: Fall Restrictions Weight Bearing Restrictions: No Other Position/Activity Restrictions: no restrictions listed in chart but pt pain in bilateral feet/ankles with weight bearing       Mobility Bed Mobility Overal bed mobility: Needs Assistance Bed Mobility: Supine to Sit     Supine to sit: Mod assist;HOB elevated     General bed mobility comments: Able to get legs off of bed but needed to A for trunk    Transfers Overall transfer level: Needs assistance Equipment used: Rolling walker (2 wheeled) Transfers: Sit to/from [facility] Sit to Stand: Min assist Stand pivot transfers: Min assist            Balance Overall balance assessment: Needs  assistance Sitting-balance support: No upper extremity supported;Feet supported Sitting balance-Leahy Scale: Fair     Standing balance support: Bilateral upper extremity supported Standing balance-Leahy Scale: Poor Standing balance comment: reliant on RW and external A of therapist                           ADL either performed or assessed with clinical judgement   ADL Overall ADL's : Needs assistance/impaired                         Toilet Transfer: Minimal assistance;Stand-pivot;BSC;RW                   Vision Patient Visual Report: No change from baseline            Cognition Arousal/Alertness: Awake/alert Behavior During Therapy: Anxious Overall Cognitive Status: No family/caregiver present to determine baseline cognitive functioning                                 General Comments: A & O, following all commands                   Pertinent Vitals/ Pain       Pain Assessment: 0-10 Pain Score: 8  Pain Location: Bil LEs with movement and really with weight bearing Pain Descriptors / Indicators: Aching;Moaning;Grimacing Pain Intervention(s): Limited activity within patient's tolerance;Monitored during session;Repositioned (feet were cold so applied heat packs at feet and ice packs at ankles)  Frequency  Min 2X/week        Progress Toward Goals  OT Goals(current goals can now be found in the care plan section)  Progress towards OT goals: Progressing toward goals (slowly)  Acute Rehab OT Goals Patient Stated Goal: Wants to talk to her sister about home or rehab then home OT Goal Formulation: With patient Time For Goal Achievement: 01/03/21 Potential to Achieve Goals: [place] Discharge plan remains appropriate       AM-PAC OT "6 Clicks" Daily Activity     Outcome Measure   Help from another person eating meals?: None Help from another person taking care of personal grooming?: A Little Help from  another person toileting, which includes using toliet, bedpan, or urinal?: A Lot Help from another person bathing (including washing, rinsing, drying)?: A Lot Help from another person to put on and taking off regular upper body clothing?: A Little Help from another person to put on and taking off regular lower body clothing?: A Lot 6 Click Score: 16    End of Session Equipment Utilized During Treatment: Gait belt;Rolling walker  OT Visit Diagnosis: Unsteadiness on feet (R26.81);Other abnormalities of gait and mobility (R26.89);Muscle weakness (generalized) (M62.81);History of falling (Z91.81);Pain Pain - Right/Left:  (both) Pain - part of body: Ankle and joints of foot   Activity Tolerance Patient limited by pain (but did get OOB to recliner today)   Patient Left in chair;with call bell/phone within reach;with chair alarm set   Nurse Communication Mobility status (+1 min A with RW)        Time: 5784-6962 OT Time Calculation (min): 42 min  Charges: OT General Charges $OT Visit: 1 Visit OT Treatments $Self Care/Home Management : 38-52 mins  Golden Circle, OTR/L Acute [employer] Pager [phone] Office [phone]      Almon Register 12/23/2020, 11:11 AM

## 2020-12-23 NOTE — Progress Notes (Signed)
Occupational Therapy Treatment Patient Details Name: Kimberly [name] MRN: [id number] DOB: 1956/05/14 Today's Date: 12/23/2020    History of present illness Pt presents to ED after near syncopal event with a fall at home. She reports intermittent lightheadedness with taking meds and a toothache recently. Has carpet burns on knees from crawling to phone for 911. PMH: paroxysmal A. fib on Eliquis, prior CVA x2, ascending aortic aneurysm, dilatation of infrarenal abdominal aorta, essential hypertension, hyperlipidemia, chronic anxiety/depression, GERD, tobacco use disorder, physical debility.   OT comments  Back in to see patient again at her request to call sister to discuss discharge options. Spoke to pt first and she prefers home but agrees she cannot manage at home by herself currently due to decreased mobility and currently does not have anyone to stay with her 24/7. Sister actually called while pt and I were talking and so I spoke with her about HHPT (not an absolute option due to pt is only Medicaid per talking with CM on phone, but SNF is for receiving additional therapy). Sister voiced concern that the patient had been here six days and this was the first time she had actually gotten up. I explained to her that her sister had been since 3 times by therapy prior to me seeing her and that she had sat up on the side of the bed all 3 times but was unable to stand due to pain in feet. Sister asked about OP PT, so I called CM back and she reports pt can get OP PT as long as she can get there. Sister said they would make sure she gets there. I made her sister aware we are recommending 24/7 S/A prn for pt due to although she is moving better than she was on admission she still requires A up OOB and to ambulate--sister verbalized understanding of this. Sent chat text to CM and attending MD about pt and sister's decision.    Follow Up Recommendations  Outpatient OT ((if can only get OPPT then that will  have to do), still feel like SNF best option but pt and family prefer OP.)    Equipment Recommendations  None recommended by OT                                                  Time: 3810-1751 OT Time Calculation (min): 22 min  Charges: OT General Charges $OT Visit: 1 Visit OT Treatments $Self Care/Home Management : 8-22 mins Golden Circle, OTR/L Acute [employer] Pager [phone] Office [phone]      Almon Register 12/23/2020, 11:42 AM

## 2020-12-23 NOTE — TOC Progression Note (Signed)
Transition of Care [facility]) - Progression Note    Patient Details  Name: Kimberly [name] MRN: [id number] Date of Birth: 1956-03-20  [facility]) CM/SW Contact  Angelita Ingles, RN Phone Number: [phone]  12/23/2020, 12:21 PM  Clinical Narrative:    Per therapy sister and patient are agreeable to outpatient therapy. CM will set up outpatient therapy. Sister also reports to therapist that she will need 24 hour notice before patient is discharged. CM has called sister with no answer HIPPA compliant voicemail has been left for sister to make her aware of possible discharge for tomorrow. TOC will continue to follow.   Expected Discharge Plan: [place] Barriers to Discharge: Continued Medical Work up  Expected Discharge Plan and Services Expected Discharge Plan: [place] In-house Referral: NA Discharge Planning Services: CM Consult Post Acute Care Choice: [place] arrangements for the past 2 months: Apartment Expected Discharge Date: 12/23/20               DME Arranged: N/A DME Agency: NA                   Social Determinants of Health (SDOH) Interventions    Readmission Risk Interventions Readmission Risk Prevention Plan 12/21/2020  Transportation Screening Complete  PCP or Specialist Appt within 5-7 Days Complete  Home Care Screening Complete  Medication Review (RN CM) Referral to Pharmacy  Some recent data might be hidden

## 2020-12-23 NOTE — Plan of Care (Signed)

## 2020-12-23 NOTE — Progress Notes (Signed)
Occupational Therapy Treatment Patient Details Name: Kimberly [name] MRN: [id number] DOB: [birth year] Today's Date: 12/23/2020    History of present illness Pt presents to ED after near syncopal event with a fall at home. She reports intermittent lightheadedness with taking meds and a toothache recently. Has carpet burns on knees from crawling to phone for 911. PMH: paroxysmal A. fib on Eliquis, prior CVA x2, ascending aortic aneurysm, dilatation of infrarenal abdominal aorta, essential hypertension, hyperlipidemia, chronic anxiety/depression, GERD, tobacco use disorder, physical debility.   OT comments  This 3 female seen today for a third time to see if left resting hand splint had been delivered and it had. It was still in the bag. Fitted splint to patient showing her the matching velcro colors on strap and underneath of splint. She is aware to only wear it at night, but opted not to wear it tonight due to she is leaving tomorrow. I recommended she wear it for at least 2 weeks at night only before making a decision if she feels it is helping or not with her hand positioning and function during the day. I as made her aware if it is causing any pain, discomfort, or pressure areas to discontinue wearing it. She verbalized understanding of all of this; repeating back to me all that I had recommended to her.  Follow Up Recommendations  Outpatient OT ((if can only get OPPT then that will have to do), still feel like SNF best option but pt and family prefer OP.)          Precautions / Restrictions Precautions Precautions: Fall Restrictions Weight Bearing Restrictions: No Other Position/Activity Restrictions: no restrictions listed in chart but pt pain in bilateral feet/ankles with weight bearing                                    Nurse Communication   Nurse tech:Splint in room and fitted to pt but she is opting to not wear it tonight.       Time: 7124-5809 OT Time  Calculation (min): 10 min  Charges: OT General Charges $OT Visit: 1 Visit OT Treatments $Self Care/Home Management : 8-22 mins $Orthotics Fit/Training: 8-22 mins  Golden Circle, OTR/L Acute [employer] Pager [phone] Office [phone]      Almon Register 12/23/2020, 3:01 PM

## 2020-12-24 LAB — CULTURE, BLOOD (ROUTINE X 2)
Culture: NO GROWTH
Culture: NO GROWTH

## 2020-12-24 MED ORDER — HYDRALAZINE HCL 100 MG PO TABS: 100.0000 mg | ORAL_TABLET | Freq: Three times a day (TID) | ORAL | 0 refills | Status: DC

## 2020-12-24 MED ORDER — PREDNISONE 5 MG PO TABS: ORAL_TABLET | ORAL | 0 refills | Status: DC

## 2020-12-24 MED ORDER — HYDRALAZINE HCL 50 MG PO TABS
100.0000 mg | ORAL_TABLET | Freq: Three times a day (TID) | ORAL | Status: DC
  Administered 2020-12-24 – 2020-12-25 (×3): 100 mg via ORAL
  Filled 2020-12-24 (×3): qty 2

## 2020-12-24 MED ORDER — DOXYCYCLINE HYCLATE 100 MG PO TABS: 100.0000 mg | ORAL_TABLET | Freq: Two times a day (BID) | ORAL | 0 refills | Status: DC

## 2020-12-24 MED ORDER — DOCUSATE SODIUM 100 MG PO CAPS: 100.0000 mg | ORAL_CAPSULE | Freq: Two times a day (BID) | ORAL | 0 refills | Status: DC | PRN

## 2020-12-24 MED ORDER — CARVEDILOL 6.25 MG PO TABS: 6.2500 mg | ORAL_TABLET | Freq: Two times a day (BID) | ORAL | 0 refills | Status: DC

## 2020-12-24 MED ORDER — AMLODIPINE BESYLATE 10 MG PO TABS: 10.0000 mg | ORAL_TABLET | Freq: Every day | ORAL | 0 refills | Status: DC

## 2020-12-24 MED ORDER — ACETAMINOPHEN 325 MG PO TABS: 650.0000 mg | ORAL_TABLET | Freq: Four times a day (QID) | ORAL | 0 refills | Status: DC | PRN

## 2020-12-24 MED ORDER — MAGNESIUM HYDROXIDE 400 MG/5ML PO SUSP
30.0000 mL | Freq: Two times a day (BID) | ORAL | Status: AC
  Administered 2020-12-24 (×2): 30 mL via ORAL
  Filled 2020-12-24 (×2): qty 30

## 2020-12-24 MED ORDER — PANTOPRAZOLE SODIUM 40 MG PO TBEC: 40.0000 mg | DELAYED_RELEASE_TABLET | Freq: Every day | ORAL | 0 refills | Status: DC

## 2020-12-24 NOTE — TOC Transition Note (Addendum)
Transition of Care [facility]) - CM/SW Discharge Note   Patient Details  Name: Kimberly [name] MRN: [id number] Date of Birth: 21-Mar-1956  Transition of Care [facility]) CM/SW Contact:  Angelita Ingles, RN Phone Number: [phone]  12/24/2020, 12:55 PM   Clinical Narrative:    CM at bedside to set up DME for discharge. DME has been set up with [facility] and will be delivered to the room. Patient states that she does not want to go home today because she feels to weak. Patient states that she did not mention this to the MD. CM made patient aware that Cm can not cancel the discharge but message will be sent to MD. [facility] PT/OT and DME has been set up.  1300 CM spoke with patients sister to make her aware that patient has discharge orders but feels that she is too weak to d/c today. Sister states that if patient discharges today she will not be able to pick her up until about 8-8:30 pm. Sister states that the family is just trying to get everything straightened out. Cm made sister aware that CM can not make the call and that message has been sent to the MD.    Final next level of care: Other (comment) (Home with outpatient PT/OT) Barriers to Discharge: No Barriers Identified   Patient Goals and CMS Choice Patient states their goals for this hospitalization and ongoing recovery are:: Patient states that she is ready to go home CMS Medicare.gov Compare Post Acute Care list provided to:: Patient Choice offered to / list presented to : Patient  Discharge Placement                       Discharge Plan and Services In-house Referral: NA Discharge Planning Services: CM Consult Post Acute Care Choice: Home Health          DME Arranged: 3-N-1,Walker rolling DME Agency: [employer] Date DME Agency Contacted: 12/24/20 Time DME Agency Contacted: [id number] Representative spoke with at DME Agency: [place]: NA [place] Agency: NA        Social Determinants of Health (Gas) Interventions      Readmission Risk Interventions Readmission Risk Prevention Plan 12/21/2020  Transportation Screening Complete  PCP or Specialist Appt within 5-7 Days Complete  Home Care Screening Complete  Medication Review (RN CM) Referral to Pharmacy  Some recent data might be hidden

## 2020-12-24 NOTE — Discharge Summary (Signed)
Kimberly [name] ULA:[id number] DOB: 01/29/1956 DOA: 12/18/2020  PCP: [name], FNP  Admit date: 12/18/2020  Discharge date: 12/24/2020  Admitted From: Home   Disposition:  Home - refused SNF   Recommendations for Outpatient Follow-up:   Follow up with PCP in 1-2 weeks  PCP Please obtain BMP/CBC, 2 view CXR in 1week,  (see Discharge instructions)   PCP Please follow up on the following pending results: Please check CBC, CMP, magnesium, chest x-ray and uric acid levels in 7 to 10 days.  Needs close outpatient cardiology and pulmonary follow-up.   Home Health: PT, RN   Equipment/Devices: Walker, 3in1  Consultations: None  Discharge Condition: Stable    CODE STATUS: Full    Diet Recommendation: Heart Healthy   Diet Order            Diet - low sodium heart healthy           Diet Heart Room service appropriate? Yes; Fluid consistency: Thin  Diet effective now                  CC - Leg pain   Brief history of present illness from the day of admission and additional interim summary    Kimberly D Goinsis a 65 y.o.femalewith medical history significant forparoxysmal A. fib on Eliquis, prior CVA x2, ascending aortic aneurysm, dilatation of infrarenal abdominal aorta, essential hypertension, hyperlipidemia, chronic anxiety/depression, GERD, tobacco use disorder, physical debility, recently prescribed antibiotics,penicillin on 5/6/2022for dental caries, who presented to [facility] ED from home after a near syncope event and a fallat home, in the ER she was diagnosed with dehydration, hypotension there was question of sepsis of unclear origin and were admitted to the hospital.                                                                 Hospital Course      1. Near syncope in the setting of dehydration,  possible sepsis - she was recently treated for dental caries and UTI, does not appear toxic, has been started on vancomycin and cefepime upon admission and has been subsequently titrated down to Doxy which she will finish 3 more days, however sepsis pathophysiology has resolved, she has been adequately hydrated with IV fluids and monitor final cultures and trend procalcitonin negative till 12/24/20.  She was cleared by PT OT still quite weak and be clearly recommended that she should go to SNF however patient and family both refused placement despite counseling, will be discharged home with home health and DME.  High risk for readmission.  2.  Acute hypoxic respiratory failure - resolved currently symptom-free, most likely was due to transient atelectasis, completely resolved with supportive care.  3.  History of CAD, paroxysmal A. fib, CHA2DS2-VASc 2 score of greater than 4.  She is chest pain-free.  Her troponin rise is in non-ACS pattern, echo noted with EF 55% and possibly old wall motion abnormalities.  Case discussed with cardiologist Dr. Sallyanne Kuster - outpatient follow-up and further work-up if needed.  Continue combination of Eliquis, statin and Coreg for now.  Discontinued as it is she is high fall risk and combination of Eliquis and aspirin does not seem to be the best.  4.  History of CVA with chronic left-sided hemiparesis.  Continue Eliquis and statin for secondary prevention.  5.    Left foot and toe discomfort.  Extremely tender to touch highly suspicious for gout, stable uric acid level and x-rays likely uric acid normal due to consumption, much improved after a dose of Solu-Medrol and colchicine, due to her being on high-dose statin colchicine long-term is not a good option hence she will be discharged on a steroid taper, request PCP to recheck uric acid levels in 1 month.    6.  History of smoking and COPD.  Stable supportive care counseled to quit smoking.  7.  Chronic microcytic  anemia.  Monitor outpatient follow-up with PCP.  8.  History of ascending aortic aneurysm.  Outpatient cardiology follow-up.  9.  Lung nodule noted on CT chest.  She is a smoker we will have her follow-up with pulmonary outpatient for repeat CT in 12 months.  10.  Moderate pulmonary artery hypertension.  Supportive care outpatient pulmonary follow-up.  11.  Hypomagnesemia.  Replaced again IV.   12.  Constipation.  Placed on aggressive bowel regimen, resolved.        Discharge diagnosis     Active Problems:   Sepsis [facility])    Discharge instructions    Discharge Instructions    Ambulatory referral to Occupational Therapy   Complete by: As directed    Ambulatory referral to Physical Therapy   Complete by: As directed    Diet - low sodium heart healthy   Complete by: As directed    Discharge instructions   Complete by: As directed    Follow with Primary MD [name], FNP in 7 days   Get CBC, CMP, 2 view Chest X ray -  checked next visit within 1 week by Primary MD    Activity: As tolerated with Full fall precautions use walker/cane & assistance as needed  Disposition Home    Diet: Heart Healthy  with feeding assistance and aspiration precautions.    Special Instructions: If you have smoked or chewed Tobacco  in the last 2 yrs please stop smoking, stop any regular Alcohol  and or any Recreational drug use.  On your next visit with your primary care physician please Get Medicines reviewed and adjusted.  Please request your Prim.MD to go over all Hospital Tests and Procedure/Radiological results at the follow up, please get all Hospital records sent to your Prim MD by signing hospital release before you go home.  If you experience worsening of your admission symptoms, develop shortness of breath, life threatening emergency, suicidal or homicidal thoughts you must seek medical attention immediately by calling 911 or calling your MD immediately  if symptoms less  severe.  You Must read complete instructions/literature along with all the possible adverse reactions/side effects for all the Medicines you take and that have been prescribed to you. Take any new Medicines after you have completely understood and accpet all the possible adverse reactions/side effects.   Increase activity slowly   Complete by: As directed    No wound  care   Complete by: As directed       Discharge Medications   Allergies as of 12/24/2020      Reactions   Clonidine Derivatives Other (See Comments)   Patch causes scars   Spironolactone Rash      Medication List    STOP taking these medications   Aspirin Low Dose 81 MG EC tablet Generic drug: aspirin   metoprolol tartrate 50 MG tablet Commonly known as: LOPRESSOR   omeprazole 20 MG capsule Commonly known as: PRILOSEC Replaced by: pantoprazole 40 MG tablet   penicillin v potassium 500 MG tablet Commonly known as: VEETID     TAKE these medications   acetaminophen 325 MG tablet Commonly known as: TYLENOL Take 2 tablets (650 mg total) by mouth every 6 (six) hours as needed for moderate pain, mild pain, fever or headache.   amLODipine 10 MG tablet Commonly known as: NORVASC Take 1 tablet (10 mg total) by mouth daily. Start taking on: Dec 25, 2020 What changed:   medication strength  how much to take   apixaban 5 MG Tabs tablet Commonly known as: Eliquis Take 1 tablet (5 mg total) by mouth 2 (two) times daily.   atorvastatin 80 MG tablet Commonly known as: LIPITOR Take 1 tablet (80 mg total) by mouth daily.   Blood Pressure Kit Devi Use as directed to check blood pressure daily dx: I10   carvedilol 6.25 MG tablet Commonly known as: COREG Take 1 tablet (6.25 mg total) by mouth 2 (two) times daily with a meal.   docusate sodium 100 MG capsule Commonly known as: COLACE Take 1 capsule (100 mg total) by mouth 2 (two) times daily as needed for mild constipation.   doxycycline 100 MG  tablet Commonly known as: VIBRA-TABS Take 1 tablet (100 mg total) by mouth every 12 (twelve) hours.   FeroSul 325 (65 FE) MG tablet Generic drug: ferrous sulfate TAKE 1 TABLET BY MOUTH EVERY DAY WITH BREAKFAST What changed: See the new instructions.   hydrALAZINE 100 MG tablet Commonly known as: APRESOLINE Take 1 tablet (100 mg total) by mouth every 8 (eight) hours.   hydrocortisone cream 1 % Apply 1 application topically daily as needed for itching.   meclizine 25 MG tablet Commonly known as: ANTIVERT Take 1 tablet (25 mg total) by mouth 3 (three) times daily as needed for dizziness.   mirtazapine 15 MG tablet Commonly known as: REMERON TAKE 1 TABLET BY MOUTH AT BEDTIME   multivitamin capsule Take 1 capsule by mouth daily.   pantoprazole 40 MG tablet Commonly known as: PROTONIX Take 1 tablet (40 mg total) by mouth daily. Start taking on: Dec 25, 2020 Replaces: omeprazole 20 MG capsule   polyethylene glycol 17 g packet Commonly known as: MIRALAX / GLYCOLAX Take 17 g by mouth daily. What changed:   when to take this  reasons to take this   predniSONE 5 MG tablet Commonly known as: DELTASONE Label  & dispense according to the schedule below. take 8 Pills PO for 3 days, 6 Pills PO for 3 days, 4 Pills PO for 3 days, 2 Pills PO for 3 days, 1 Pills PO for 3 days, 1/2 Pill  PO for 3 days then STOP. Total 65 pills.   traMADol 50 MG tablet Commonly known as: Ultram Take 1 tablet (50 mg total) by mouth every 6 (six) hours as needed. What changed: reasons to take this            Durable  Medical Equipment  (From admission, onward)         Start     Ordered   12/24/20 1008  For home use only DME Walker rolling  Once       Comments: 5 wheel  Question Answer Comment  Walker: With 5 Inch Wheels   Patient needs a walker to treat with the following condition Weakness      12/24/20 1007   12/24/20 1008  For home use only DME 3 n 1  Once        12/24/20 1007            Follow-up Information    [name], MD. Schedule an appointment as soon as possible for a visit in 1 week(s).   Specialty: Cardiology Why: Echo review Contact information: [address] [place] [address] [place], [place], [place]. Schedule an appointment as soon as possible for a visit in 1 week(s).   Specialty: General Practice Why: review your Echo and other radiological reports Contact information: [address] [place] [address] [phone]        Brand Males, MD. Schedule an appointment as soon as possible for a visit in 1 week(s).   Specialty: Pulmonary Disease Why: Lung Nodule Contact information: [address] [place] [place] [address] [phone]               Major procedures and Radiology Reports - PLEASE review detailed and final reports thoroughly  -        DG Tibia/Fibula Right  Result Date: 12/18/2020 CLINICAL DATA:  Fall with right lower leg pain. EXAM: RIGHT TIBIA AND FIBULA - 2 VIEW COMPARISON:  Knee and ankle radiographs same date. FINDINGS: The bones are demineralized. No evidence of acute fracture or dislocation. Mild medial compartment joint space narrowing at the knee, scattered vascular calcifications and diffuse soft tissue edema, greatest distally, are again noted. IMPRESSION: No acute osseous findings identified within the right lower leg. Diffuse soft tissue edema/swelling. Electronically Signed   By: Richardean Sale M.D.   On: 12/18/2020 15:16   DG Ankle Complete Left  Result Date: 12/19/2020 CLINICAL DATA:  65 year old female status post fall with severe pain. EXAM: LEFT ANKLE COMPLETE - 3+ VIEW COMPARISON:  Left foot series today. FINDINGS: Soft tissue swelling appears greater on the medial aspect of the ankle. No joint effusion on cross-table lateral view. Preserved mortise joint alignment. Talar dome intact. No acute osseous abnormality identified. IMPRESSION: Soft  tissue swelling with no acute fracture or dislocation identified about the left ankle. Electronically Signed   By: Genevie Ann M.D.   On: 12/19/2020 10:02   DG Ankle Complete Right  Result Date: 12/18/2020 CLINICAL DATA:  Fall EXAM: RIGHT ANKLE - COMPLETE 3+ VIEW COMPARISON:  None. FINDINGS: The bones appear diffusely demineralized. No evidence of acute fracture or malalignment. Generalized soft tissue swelling present about the ankle and lower leg. Trace atherosclerotic vascular calcifications. IMPRESSION: Soft tissue swelling about the ankle and lower leg without evidence of acute fracture or malalignment. The bones appear demineralized suggesting underlying osteoporosis. Electronically Signed   By: Jacqulynn Cadet M.D.   On: 12/18/2020 13:52   CT Head Wo Contrast  Result Date: 12/18/2020 CLINICAL DATA:  [place] this morning. Left-sided weakness from a previous stroke. Taking blood thinners. EXAM: CT HEAD WITHOUT CONTRAST CT CERVICAL SPINE WITHOUT CONTRAST TECHNIQUE: Multidetector CT imaging of the head and cervical spine was  performed following the standard protocol without intravenous contrast. Multiplanar CT image reconstructions of the cervical spine were also generated. COMPARISON:  Head CT dated 04/25/2020. Cervical spine CT dated 02/13/2018. FINDINGS: CT HEAD FINDINGS Brain: Stable old right occipital lobe infarct. Stable old right thalamic lacunar infarct. Stable mildly enlarged ventricles and cortical sulci. Mild-to-moderate patchy white matter low density in both cerebral hemispheres without significant change. No intracranial hemorrhage, mass lesion or CT evidence of acute infarction. Vascular: Dense bilateral cavernous carotid atheromatous calcifications. Skull: Normal. Negative for fracture or focal lesion. Sinuses/Orbits: Unremarkable. Other: None. CT CERVICAL SPINE FINDINGS Alignment: Mild reversal of the normal cervical lordosis without significant change. No subluxations. Skull base and  vertebrae: No acute fracture. No primary bone lesion or focal pathologic process. Soft tissues and spinal canal: No prevertebral fluid or swelling. No visible canal hematoma. Disc levels:  Multilevel degenerative changes with little change. Upper chest: Mild biapical bullous changes. Other: Dense bilateral carotid artery calcifications. IMPRESSION: 1. No skull fracture or intracranial hemorrhage. 2. No cervical spine fracture or subluxation. 3. Stable cerebral atrophy, chronic small vessel white matter ischemic changes, old right occipital lobe infarct and old right thalamic lacunar infarct. 4. Stable cervical spine degenerative changes. Electronically Signed   By: Claudie Revering M.D.   On: 12/18/2020 16:06   CT CHEST WO CONTRAST  Result Date: 12/18/2020 CLINICAL DATA:  Possible sepsis. EXAM: CT CHEST WITHOUT CONTRAST TECHNIQUE: Multidetector CT imaging of the chest was performed following the standard protocol without IV contrast. COMPARISON:  September 21, 2019 FINDINGS: Cardiovascular: There is marked severity calcification of the aortic arch and descending thoracic aorta. The ascending thoracic aorta measures approximately 4.8 cm x 4.2 cm. Stable pulmonary artery enlargement is seen. There is moderate severity cardiomegaly with marked severity coronary artery calcification. No pericardial effusion. Mediastinum/Nodes: No enlarged mediastinal or axillary lymph nodes. Thyroid gland, trachea, and esophagus demonstrate no significant findings. Lungs/Pleura: Mild to moderate severity areas of scarring and/or linear atelectasis are seen within the bilateral lower lobes. A 5 mm noncalcified nodular component is seen within the posteromedial aspect of the left lung base (axial CT image 118, CT series number 4). This represents a new finding when compared to the prior exam. There is no evidence of a pleural effusion or pneumothorax. Upper Abdomen: No acute abnormality. Musculoskeletal: Multiple sternal wires are present.  No acute osseous abnormalities are identified. IMPRESSION: 1. Mild to moderate severity bilateral lower lobe linear scarring and/or atelectasis with a small noncalcified nodular component within the left lung base. No follow-up needed if patient is low-risk. Non-contrast chest CT can be considered in 12 months if patient is high-risk. This recommendation follows the consensus statement: Guidelines for Management of Incidental Pulmonary Nodules Detected on CT Images: From the Fleischner Society 2017; Radiology 2017; 284:228-243. 2. 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 3. Additional stable findings suggestive of pulmonary arterial hypertension. Electronically Signed   By: Virgina Norfolk M.D.   On: 12/18/2020 18:39   CT Cervical Spine Wo Contrast  Result Date: 12/18/2020 CLINICAL DATA:  [place] this morning. Left-sided weakness from a previous stroke. Taking blood thinners. EXAM: CT HEAD WITHOUT CONTRAST CT CERVICAL SPINE WITHOUT CONTRAST TECHNIQUE: Multidetector CT imaging of the head and cervical spine was performed following the standard protocol without intravenous contrast. Multiplanar CT image reconstructions of the cervical spine were also generated. COMPARISON:  Head CT dated 04/25/2020. Cervical spine CT dated 02/13/2018. FINDINGS: CT HEAD FINDINGS Brain: Stable old right occipital lobe infarct. Stable old right  thalamic lacunar infarct. Stable mildly enlarged ventricles and cortical sulci. Mild-to-moderate patchy white matter low density in both cerebral hemispheres without significant change. No intracranial hemorrhage, mass lesion or CT evidence of acute infarction. Vascular: Dense bilateral cavernous carotid atheromatous calcifications. Skull: Normal. Negative for fracture or focal lesion. Sinuses/Orbits: Unremarkable. Other: None. CT CERVICAL SPINE FINDINGS Alignment: Mild reversal of the normal cervical lordosis without significant change. No subluxations. Skull base and  vertebrae: No acute fracture. No primary bone lesion or focal pathologic process. Soft tissues and spinal canal: No prevertebral fluid or swelling. No visible canal hematoma. Disc levels:  Multilevel degenerative changes with little change. Upper chest: Mild biapical bullous changes. Other: Dense bilateral carotid artery calcifications. IMPRESSION: 1. No skull fracture or intracranial hemorrhage. 2. No cervical spine fracture or subluxation. 3. Stable cerebral atrophy, chronic small vessel white matter ischemic changes, old right occipital lobe infarct and old right thalamic lacunar infarct. 4. Stable cervical spine degenerative changes. Electronically Signed   By: Claudie Revering M.D.   On: 12/18/2020 16:06   DG Pelvis Portable  Result Date: 12/18/2020 CLINICAL DATA:  Patient fell. EXAM: PORTABLE PELVIS 1-2 VIEWS COMPARISON:  Pelvic CT 08/19/2020. FINDINGS: The bones appear mildly demineralized. No evidence of acute fracture, dislocation or femoral head avascular necrosis. The hip joint spaces are preserved. There are mild sacroiliac degenerative changes bilaterally. Diffuse vascular calcifications are noted. IMPRESSION: No acute osseous findings. Electronically Signed   By: Richardean Sale M.D.   On: 12/18/2020 15:12   DG Chest Port 1 View  Result Date: 12/23/2020 CLINICAL DATA:  Short of breath. EXAM: PORTABLE CHEST 1 VIEW COMPARISON:  12/18/20 FINDINGS: Status post CABG. Stable cardiac enlargement. No pleural effusion or edema. No airspace densities. The visualized osseous structures are unremarkable. IMPRESSION: No acute cardiopulmonary abnormalities. Electronically Signed   By: Kerby Moors M.D.   On: 12/23/2020 14:40   DG Chest Port 1 View  Result Date: 12/18/2020 CLINICAL DATA:  [place].  Smoker. EXAM: PORTABLE CHEST 1 VIEW COMPARISON:  08/18/2020 FINDINGS: Stable enlarged cardiac silhouette and post CABG changes. The aorta remains tortuous and partially calcified. Clear lungs. The lungs remain mildly  hyperexpanded. Unremarkable bones. IMPRESSION: 1. No acute abnormality. 2. Stable cardiomegaly and changes of COPD. Electronically Signed   By: Claudie Revering M.D.   On: 12/18/2020 15:12   DG Knee Complete 4 Views Left  Result Date: 12/18/2020 CLINICAL DATA:  Left knee pain following a fall today. EXAM: LEFT KNEE - COMPLETE 4+ VIEW COMPARISON:  03/24/2018 FINDINGS: The bones appear osteopenic. No fracture, dislocation or effusion. Atheromatous arterial calcifications. IMPRESSION: 1. No fracture. 2. Atheromatous arterial calcifications. Electronically Signed   By: Claudie Revering M.D.   On: 12/18/2020 13:53   DG Knee Complete 4 Views Right  Result Date: 12/18/2020 CLINICAL DATA:  Right knee pain following a fall today. EXAM: RIGHT KNEE - COMPLETE 4+ VIEW COMPARISON:  None. FINDINGS: Diffuse osteopenia. Mild medial joint space narrowing. No fracture, no fracture or dislocation. Small effusion. Mild diffuse soft tissue swelling. Atheromatous arterial calcifications. IMPRESSION: 1. Small effusion. 2. No fracture or dislocation seen. 3. Mild medial joint space narrowing. 4. Atheromatous arterial calcifications. Electronically Signed   By: Claudie Revering M.D.   On: 12/18/2020 13:55   DG Abd Portable 1V  Result Date: 12/23/2020 CLINICAL DATA:  Constipation. EXAM: PORTABLE ABDOMEN - 1 VIEW COMPARISON:  Single-view of the abdomen 11/26/2018. FINDINGS: The bowel gas pattern is normal. There is a large volume of stool throughout the colon. No  radio-opaque calculi or other significant radiographic abnormality are seen. IMPRESSION: Large colonic stool burden throughout. The exam is otherwise negative. Electronically Signed   By: Inge Rise M.D.   On: 12/23/2020 14:36   DG Foot Complete Left  Result Date: 12/19/2020 CLINICAL DATA:  65 year old female status post fall with severe pain. EXAM: LEFT FOOT - COMPLETE 3+ VIEW COMPARISON:  Left foot series 02/09/2012. FINDINGS: Possible chronic fracture of the 5th proximal  phalanx since 2013. But no acute osseous abnormality identified. Normal joint spaces and alignment in the left foot. Calcaneus appears intact on the cross-table lateral view. There is mild generalized soft tissue swelling. No soft tissue gas. IMPRESSION: No acute osseous abnormality identified about the left foot. Electronically Signed   By: Genevie Ann M.D.   On: 12/19/2020 10:01   DG Foot Complete Right  Result Date: 12/18/2020 CLINICAL DATA:  Fall onto right side.  Ankle pain. EXAM: RIGHT FOOT COMPLETE - 3+ VIEW COMPARISON:  Ankle radiographs same date. FINDINGS: The bones are demineralized. There is no evidence of acute fracture or dislocation. Toe evaluation is limited by positioning on the AP and oblique views. The toes appear unremarkable on the lateral view. No focal soft tissue swelling or foreign body identified in the foot. IMPRESSION: Osteopenia without acute osseous findings in the foot. Electronically Signed   By: Richardean Sale M.D.   On: 12/18/2020 15:14   ECHOCARDIOGRAM LIMITED  Result Date: 12/19/2020    ECHOCARDIOGRAM LIMITED REPORT   Patient Name:   Kimberly [name] Date of Exam: 12/19/2020 Medical Rec #:  [id number]         Height:       67.0 in Accession #:    [id number]        Weight:       130.0 lb Date of Birth:  [birth year]        BSA:          1.684 m Patient Age:    66 years          BP:           136/100 mmHg Patient Gender: F                 HR:           73 bpm. Exam Location:  Inpatient Procedure: Limited Echo, 3D Echo, Cardiac Doppler and Color Doppler Indications:    I50.40* Unspecified combined systolic (congestive) and diastolic                 (congestive) heart failure  History:        Patient has prior history of Echocardiogram examinations, most                 recent 08/19/2020. Apical variant hypertrophic cardiomyopathy and                 CHF, CAD, Abnormal ECG, Stroke, Arrythmias:Atrial Fibrillation;                 Risk Factors:Hypertension and Current Smoker.   Sonographer:    Roseanna Rainbow RDCS Referring Phys: [id number]osh [place]  1. Left ventricular ejection fraction, by estimation, is 55%. Left ventricular ejection fraction by 3D volume is 52 %. The left ventricle has normal function. The left ventricle demonstrates regional wall motion abnormalities (see scoring diagram/findings for description). There is severe asymmetric left ventricular hypertrophy of the apical segment.  2. Right ventricular systolic function is mildly reduced. The  right ventricular size is mildly enlarged. There is moderately elevated pulmonary artery systolic pressure. The estimated right ventricular systolic pressure is 40.9 mmHg.  3. Left atrial size was severely dilated.  4. Right atrial size was moderately dilated.  5. The mitral valve is grossly normal. Mild mitral valve regurgitation.  6. Tricuspid valve regurgitation is moderate.  7. The aortic valve is grossly normal. Aortic valve regurgitation is not visualized.  8. Aortic dilatation noted. There is mild dilatation of the ascending aorta, measuring 44 mm.  9. The inferior vena cava is dilated in size with <50% respiratory variability, suggesting right atrial pressure of 15 mmHg. Comparison(s): A prior study was performed on 08/19/20. Prior images reviewed side by side. LVEF may be slightly decreased compared to prior, regional wall motion abnormalities may have been present on prior study, but more prominent today. FINDINGS  Left Ventricle: Left ventricular ejection fraction, by estimation, is 55%. Left ventricular ejection fraction by 3D volume is 52 %. The left ventricle has normal function. The left ventricle demonstrates regional wall motion abnormalities. There is severe asymmetric left ventricular hypertrophy of the apical segment.  LV Wall Scoring: The basal inferolateral segment is akinetic. The antero-lateral wall, mid inferolateral segment, and basal inferior segment are hypokinetic. Right Ventricle: The right  ventricular size is mildly enlarged. Right ventricular systolic function is mildly reduced. There is moderately elevated pulmonary artery systolic pressure. The tricuspid regurgitant velocity is 3.32 m/s, and with an assumed right atrial pressure of 15 mmHg, the estimated right ventricular systolic pressure is 81.1 mmHg. Left Atrium: Left atrial size was severely dilated. Right Atrium: Right atrial size was moderately dilated. Mitral Valve: The mitral valve is grossly normal. Mild mitral valve regurgitation. Tricuspid Valve: Tricuspid valve regurgitation is moderate. Aortic Valve: The aortic valve is grossly normal. Aortic valve regurgitation is not visualized. Aorta: Aortic dilatation noted. There is mild dilatation of the ascending aorta, measuring 44 mm. Venous: The inferior vena cava is dilated in size with less than 50% respiratory variability, suggesting right atrial pressure of 15 mmHg. LEFT VENTRICLE PLAX 2D LVIDd:         4.40 cm LVIDs:         3.80 cm LV PW:         1.50 cm         3D Volume EF LV IVS:        1.10 cm         LV 3D EF:    Left LVOT diam:     1.80 cm                      ventricular LVOT Area:     2.54 cm                     ejection                                             fraction by                                             3D volume LV Volumes (MOD)  is 52 %. LV vol d, MOD    53.4 ml A2C: LV vol d, MOD    46.9 ml       3D Volume EF: A4C:                           3D EF:        52 % LV vol s, MOD    22.2 ml A2C: LV vol s, MOD    21.3 ml A4C: LV SV MOD A2C:   31.2 ml LV SV MOD A4C:   46.9 ml LV SV MOD BP:    27.5 ml IVC IVC diam: 2.50 cm LEFT ATRIUM         Index LA diam:    2.50 cm 1.48 cm/m   AORTA Ao Root diam: 3.20 cm Ao Asc diam:  4.40 cm TRICUSPID VALVE TR Peak grad:   44.1 mmHg TR Vmax:        332.00 cm/s  SHUNTS Systemic Diam: 1.80 cm Cherlynn Kaiser MD Electronically signed by Cherlynn Kaiser MD Signature Date/Time: 12/19/2020/11:38:29 AM    Final      Micro Results     Recent Results (from the past 240 hour(s))  Urine culture     Status: None   Collection Time: 12/18/20  6:07 PM   Specimen: In/Out Cath Urine  Result Value Ref Range Status   Specimen Description IN/OUT CATH URINE  Final   Special Requests NONE  Final   Culture   Final    NO GROWTH Performed at [facility], [address] [place],  [address]    Report Status 12/19/2020 FINAL  Final  Resp Panel by RT-PCR (Flu A&B, Covid) Nasopharyngeal Swab     Status: None   Collection Time: 12/18/20  7:59 PM   Specimen: Nasopharyngeal Swab; Nasopharyngeal(NP) swabs in vial transport medium  Result Value Ref Range Status   SARS Coronavirus 2 by RT PCR NEGATIVE NEGATIVE Final    Comment: (NOTE) SARS-CoV-2 target nucleic acids are NOT DETECTED.  The SARS-CoV-2 RNA is generally detectable in upper respiratory specimens during the acute phase of infection. The lowest concentration of SARS-CoV-2 viral copies this assay can detect is 138 copies/mL. A negative result does not preclude SARS-Cov-2 infection and should not be used as the sole basis for treatment or other patient management decisions. A negative result may occur with  improper specimen collection/handling, submission of specimen other than nasopharyngeal swab, presence of viral mutation(s) within the areas targeted by this assay, and inadequate number of viral copies(<138 copies/mL). A negative result must be combined with clinical observations, patient history, and epidemiological information. The expected result is Negative.  Fact Sheet for Patients:  EntrepreneurPulse.com.au  Fact Sheet for Healthcare Providers:  IncredibleEmployment.be  This test is no t yet approved or cleared by the [place] FDA and  has been authorized for detection and/or diagnosis of SARS-CoV-2 by FDA under an Emergency Use Authorization (EUA). This EUA will remain  in effect  (meaning this test can be used) for the duration of the COVID-19 declaration under Section 564(b)(1) of the Act, 21 U.S.C.section 360bbb-3(b)(1), unless the authorization is terminated  or revoked sooner.       Influenza A by PCR NEGATIVE NEGATIVE Final   Influenza B by PCR NEGATIVE NEGATIVE Final    Comment: (NOTE) The Xpert Xpress SARS-CoV-2/FLU/RSV plus assay is intended as an aid in the diagnosis of influenza from Nasopharyngeal swab specimens  and should not be used as a sole basis for treatment. Nasal washings and aspirates are unacceptable for Xpert Xpress SARS-CoV-2/FLU/RSV testing.  Fact Sheet for Patients: EntrepreneurPulse.com.au  Fact Sheet for Healthcare Providers: IncredibleEmployment.be  This test is not yet approved or cleared by the [place] FDA and has been authorized for detection and/or diagnosis of SARS-CoV-2 by FDA under an Emergency Use Authorization (EUA). This EUA will remain in effect (meaning this test can be used) for the duration of the COVID-19 declaration under Section 564(b)(1) of the Act, 21 U.S.C. section 360bbb-3(b)(1), unless the authorization is terminated or revoked.  Performed at [facility], [place] [address], [place], [place] [address]   Blood Culture (routine x 2)     Status: None (Preliminary result)   Collection Time: 12/19/20  3:28 AM   Specimen: BLOOD RIGHT HAND  Result Value Ref Range Status   Specimen Description BLOOD RIGHT HAND  Final   Special Requests   Final    BOTTLES DRAWN AEROBIC ONLY Blood Culture results may not be optimal due to an inadequate volume of blood received in culture bottles   Culture   Final    NO GROWTH 4 DAYS Performed at [facility], [place] [address] [place], [place] [address]    Report Status PENDING  Incomplete  Blood Culture (routine x 2)     Status: None (Preliminary result)   Collection Time: 12/19/20  3:28 AM   Specimen: BLOOD  Result Value  Ref Range Status   Specimen Description BLOOD SITE NOT SPECIFIED  Final   Special Requests   Final    BOTTLES DRAWN AEROBIC ONLY Blood Culture results may not be optimal due to an inadequate volume of blood received in culture bottles   Culture   Final    NO GROWTH 4 DAYS Performed at [facility], [place] [address], [place], [place] [address]    Report Status PENDING  Incomplete    Today   Subjective    Kimberly [name] today has no headache,no chest abdominal pain,no new weakness tingling or numbness, feels much better wants to go home today.     Objective   Blood pressure 170/90, pulse 61, temperature 97.9 F (36.6 C), temperature source Oral, resp. rate 16, height 5' 7"  (1.702 m), weight 59 kg, SpO2 96 %.   Intake/Output Summary (Last 24 hours) at 12/24/2020 1023 Last data filed at 12/23/2020 1107 Gross per 24 hour  Intake 240 ml  Output 700 ml  Net -460 ml    Exam  Awake Alert, chronic left-sided hemiparesis,  R Foot in Proform boot, some L. Foot mild swelling, mild tenderness to palpate in all left foot toes. [place].AT,PERRAL Supple Neck,No JVD, No cervical lymphadenopathy appriciated.  Symmetrical Chest wall movement, Good air movement bilaterally, CTAB RRR,No Gallops, Rubs or new Murmurs, No Parasternal Heave +ve B.Sounds, Abd Soft, No tenderness, No organomegaly appriciated, No rebound - guarding or rigidity. No Cyanosis, Clubbing or edema, No new Rash or bruise   Data Review   CBC w Diff:  Lab Results  Component Value Date   WBC 11.4 (H) 12/21/2020   HGB 10.0 (L) 12/21/2020   HGB 11.5 07/24/2018   HCT 31.7 (L) 12/21/2020   HCT 39.1 07/24/2018   PLT 187 12/21/2020   PLT 297 07/24/2018   LYMPHOPCT 11 12/21/2020   MONOPCT 5 12/21/2020   EOSPCT 0 12/21/2020   BASOPCT 0 12/21/2020    CMP:  Lab Results  Component Value  Date   NA 135 12/21/2020   NA 144 03/30/2020   K 3.8 12/21/2020   CL 108 12/21/2020   CO2 21 (L) 12/21/2020   BUN 17 12/21/2020    BUN 13 03/30/2020   CREATININE 1.09 (H) 12/21/2020   CREATININE 0.98 05/04/2014   PROT 5.3 (L) 12/21/2020   PROT 6.7 12/02/2019   ALBUMIN 2.6 (L) 12/21/2020   ALBUMIN 4.6 12/02/2019   BILITOT 0.6 12/21/2020   BILITOT 0.4 12/02/2019   ALKPHOS 90 12/21/2020   AST 17 12/21/2020   ALT 33 12/21/2020  .   Total Time in preparing paper work, data evaluation and todays exam - 73 minutes  Lala Lund M.D on 12/24/2020 at 10:23 AM  [facility]

## 2020-12-24 NOTE — Plan of Care (Signed)
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
## 2020-12-24 NOTE — Discharge Instructions (Signed)
Follow with Primary MD [name], FNP in 7 days   Get CBC, CMP, 2 view Chest X ray -  checked next visit within 1 week by Primary MD    Activity: As tolerated with Full fall precautions use walker/cane & assistance as needed  Disposition Home    Diet: Heart Healthy  with feeding assistance and aspiration precautions.    Special Instructions: If you have smoked or chewed Tobacco  in the last 2 yrs please stop smoking, stop any regular Alcohol  and or any Recreational drug use.  On your next visit with your primary care physician please Get Medicines reviewed and adjusted.  Please request your Prim.MD to go over all Hospital Tests and Procedure/Radiological results at the follow up, please get all Hospital records sent to your Prim MD by signing hospital release before you go home.  If you experience worsening of your admission symptoms, develop shortness of breath, life threatening emergency, suicidal or homicidal thoughts you must seek medical attention immediately by calling 911 or calling your MD immediately  if symptoms less severe.  You Must read complete instructions/literature along with all the possible adverse reactions/side effects for all the Medicines you take and that have been prescribed to you. Take any new Medicines after you have completely understood and accpet all the possible adverse reactions/side effects.

## 2020-12-25 MED ORDER — METHYLPREDNISOLONE SODIUM SUCC 40 MG IJ SOLR
40.0000 mg | Freq: Every day | INTRAMUSCULAR | Status: DC
  Administered 2020-12-25: 40 mg via INTRAVENOUS
  Filled 2020-12-25: qty 1

## 2020-12-25 NOTE — Progress Notes (Signed)
[facility]                                                                                                                                                                         Patient Demographics  Kimberly [name], is a 65 y.o. female  ZOX:[id number]  [id number]  DOB - 05-Dec-1955  Admit date - 12/18/2020  Admitting Physician Kayleen Memos, DO  Outpatient Primary MD for the patient is [name], FNP  LOS - 7   No chief complaint on file.       Assessment & Plan    Patient seen briefly today due for discharge soon per Discharge done 12/25/20 by me, no further issues, Vital signs stable, patient feels fine.  She wanted to stay another night to work with PT, again she was counseled to go to SNF but she is adamant that she wants to go home although she remains extremely weak, I have shared my concern with patient's sister as well.  I think she is a very high risk for readmission and ideally should have gone to SNF however patient and family persistently refused.    Medications  Scheduled Meds: . amLODipine  10 mg Oral Daily  . apixaban  5 mg Oral BID  . atorvastatin  80 mg Oral Daily  . bisacodyl  10 mg Rectal Once  . carvedilol  6.25 mg Oral BID WC  . docusate sodium  100 mg Oral BID  . ferrous sulfate  325 mg Oral Q breakfast  . hydrALAZINE  100 mg Oral Q8H  . methylPREDNISolone (SOLU-MEDROL) injection  40 mg Intravenous Daily  . mirtazapine  15 mg Oral QHS  . nicotine  14 mg Transdermal Daily  . pantoprazole  40 mg Oral Daily  . polyethylene glycol  17 g Oral BID   Continuous Infusions: PRN Meds:.acetaminophen, ipratropium-albuterol, meclizine, senna-docusate, traMADol    Time Spent in minutes   10 minutes   Lala Lund M.D on 12/25/2020 at 8:42 AM  Between 7am to 7pm - Pager - [phone]  After 7pm go to [email or web address] - password [email or web address]  And look for the night coverage  person covering for me after hours  [facility] Office  [phone]    Subjective:   Kimberly [name] today has, No headache, No chest pain, No abdominal pain - No Nausea, No new weakness tingling or numbness, No Cough - SOB.   Objective:   Vitals:   12/24/20 1034 12/24/20 1242 12/24/20 2058 12/25/20 0600  BP: (!) 147/95 128/88 131/90 120/76  Pulse:  62 63 65  Resp: 17 18 18 18   Temp:  98.2 F (36.8 C) 98.1 F (36.7 C)   TempSrc:   Oral   SpO2:  99% 100% 100%  Weight:      Height:        Wt Readings from Last 3 Encounters:  12/18/20 59 kg  10/14/20 54.6 kg  09/30/20 53.5 kg    No intake or output data in the 24 hours ending 12/25/20 0842  Exam Awake Alert, Oriented X 3, Chr. L sided weakness [place].AT,PERRAL Supple Neck,No JVD, No cervical lymphadenopathy appriciated.  Symmetrical Chest wall movement, Good air movement bilaterally, CTAB RRR,No Gallops,Rubs or new Murmurs, No Parasternal Heave +ve B.Sounds, Abd Soft, Non tender, No organomegaly appriciated, No rebound - guarding or rigidity. No Cyanosis, Clubbing or edema, No new Rash or bruise     Data Review

## 2020-12-29 ENCOUNTER — Ambulatory Visit: Payer: Medicaid Other | Admitting: Cardiology

## 2020-12-30 ENCOUNTER — Telehealth: Payer: Medicaid Other

## 2020-12-30 ENCOUNTER — Other Ambulatory Visit: Payer: Self-pay

## 2020-12-30 ENCOUNTER — Telehealth: Payer: Self-pay

## 2020-12-30 NOTE — Telephone Encounter (Signed)
  Care Management   Follow Up Note   12/30/2020 Name: Kimberly [name] MRN: [id number] DOB: Nov 05, 1955   Referred by: [name], FNP Reason for referral : Chronic Care Management (RNCM Follow up call )   An unsuccessful telephone outreach was attempted today. The patient was referred to the case management team for assistance with care management and care coordination.   Follow Up Plan: A HIPPA compliant phone message was left for the patient providing contact information and requesting a return call.   Barb Merino, RN, BSN, CCM Care Management Coordinator [place] [facility]  Direct Phone: [phone]

## 2021-01-03 ENCOUNTER — Telehealth: Payer: Self-pay

## 2021-01-03 NOTE — Telephone Encounter (Signed)
Transition Care Management Follow-up Telephone Call  Date of discharge and from where: 02/24/21 Kimberly [name]  How have you been since you were released from the hospital? Her feet and legs are really swollen.  Any questions or concerns? Yes swelling in her legs are feet and SOB and she also wants you to prescribe her a patch to help her stop smoking.  Items Reviewed:  Did the pt receive and understand the discharge instructions provided? Yes   Medications obtained and verified? Yes   Any new allergies since your discharge? No   Dietary orders reviewed? Low sodium diet  Do you have support at home? Yes Sister Kimberly [name]  Other (ie: DME, Home Health, etc) no  Functional Questionnaire: (I = Independent and D = Dependent)  Bathing/Dressing-D   Meal Prep- D  Eating- I  Maintaining continence- D  Transferring/Ambulation- D  Managing Meds- D   Follow up appointments reviewed:    PCP Hospital f/u appt confirmed? Yes  scheduled to see Kimberly Brine DNP,FNP-BC on 01/05/21 @ 11:45am  Specialist Hospital f/u appt confirmed? Yes    Are transportation arrangements needed? No   If their condition worsens, is the pt aware to call  their PCP or go to the ED?yes  Was the patient provided with contact information for the PCP's office or ED?yes  Was the pt encouraged to call back with questions or concerns? yes

## 2021-01-04 ENCOUNTER — Encounter: Payer: Self-pay | Admitting: Nurse Practitioner

## 2021-01-04 ENCOUNTER — Other Ambulatory Visit: Payer: Self-pay

## 2021-01-04 ENCOUNTER — Ambulatory Visit: Payer: Medicaid Other | Admitting: Nurse Practitioner

## 2021-01-04 VITALS — BP 114/80 | HR 76 | Temp 98.3°F

## 2021-01-04 DIAGNOSIS — Z720 Tobacco use: Secondary | ICD-10-CM

## 2021-01-04 DIAGNOSIS — R0609 Other forms of dyspnea: Secondary | ICD-10-CM

## 2021-01-04 DIAGNOSIS — R600 Localized edema: Secondary | ICD-10-CM | POA: Diagnosis not present

## 2021-01-04 DIAGNOSIS — J9600 Acute respiratory failure, unspecified whether with hypoxia or hypercapnia: Secondary | ICD-10-CM

## 2021-01-04 DIAGNOSIS — R6520 Severe sepsis without septic shock: Secondary | ICD-10-CM

## 2021-01-04 DIAGNOSIS — A419 Sepsis, unspecified organism: Secondary | ICD-10-CM

## 2021-01-04 DIAGNOSIS — H539 Unspecified visual disturbance: Secondary | ICD-10-CM | POA: Diagnosis not present

## 2021-01-04 DIAGNOSIS — R0600 Dyspnea, unspecified: Secondary | ICD-10-CM

## 2021-01-04 DIAGNOSIS — R911 Solitary pulmonary nodule: Secondary | ICD-10-CM

## 2021-01-04 DIAGNOSIS — M109 Gout, unspecified: Secondary | ICD-10-CM

## 2021-01-04 MED ORDER — NICOTINE 21 MG/24HR TD PT24: 21.0000 mg | MEDICATED_PATCH | Freq: Every day | TRANSDERMAL | 3 refills | Status: DC

## 2021-01-04 NOTE — Progress Notes (Signed)
I,Yamilka [employer] as a Education administrator for [employer], FNP.,have documented all relevant documentation on the behalf of [name], FNP,as directed by  [name], FNP while in the presence of [name], [place]. This visit occurred during the SARS-CoV-2 public health emergency.  Safety protocols were in place, including screening questions prior to the visit, additional usage of staff PPE, and extensive cleaning of exam room while observing appropriate contact time as indicated for disinfecting solutions.  Subjective:     Patient ID: Kimberly [name] , female    DOB: 1955/11/25 , 65 y.o.   MRN: [id number]   Chief Complaint  Patient presents with  . HOSPITAL F/U    Patient's sister Lelan Pons stated patient's legs have been swollen since Thursday.     HPI  Patient presents today for a hospital f/u. Her sister's Lelan Pons is concerned with the swelling in her legs. They have been that way since Thursday and seems to be getting worse.  She was admitted to the hospital from 5/7 - 5/13.  She was diagnosed with sepsis. She was given IV fluids due to dehydration. She had a fall at home and went to the hospital diagnosed with sepsis with respiratory failure. She was treated with IV antibiotics and prednisone. She was also hypotensive. She was given IV fluids for dehydration  She was to have a cardioversion but missed her appt. They are to return call to Cardiology to be rescheduled.  She is getting up to move around some according to her sister. She was to have to stay at [facility] for 30 days and she did not want it. She is to get scheduled for outpatient therapy. She is having shortness of breath. She is not smoking as much. She is having shortness of breath she is doing activity.       Past Medical History:  Diagnosis Date  . Bicipital tenosynovitis   . Coronary artery disease 05/2007   [id number]   . Fall 02/13/2018  . Family history of ischemic heart disease   . Gout   . Hammer toe   . Hip,  thigh, leg, and ankle, insect bite, nonvenomous, without mention of infection(916.4)   . Hyperlipidemia   . Hypertension   . Other abnormality of red blood cells   . Rotator cuff syndrome of left shoulder   . Stroke [facility])      Family History  Problem Relation Age of Onset  . Hypertension Mother   . Heart attack Father   . Diabetes Father   . Kidney failure Father   . Anxiety disorder Sister   . Sarcoidosis Sister      Current Outpatient Medications:  .  acetaminophen (TYLENOL) 325 MG tablet, Take 2 tablets (650 mg total) by mouth every 6 (six) hours as needed for moderate pain, mild pain, fever or headache., Disp: 20 tablet, Rfl: 0 .  amLODipine (NORVASC) 10 MG tablet, Take 1 tablet (10 mg total) by mouth daily., Disp: 30 tablet, Rfl: 0 .  apixaban (ELIQUIS) 5 MG TABS tablet, Take 1 tablet (5 mg total) by mouth 2 (two) times daily., Disp: 180 tablet, Rfl: 3 .  atorvastatin (LIPITOR) 80 MG tablet, Take 1 tablet (80 mg total) by mouth daily., Disp: 90 tablet, Rfl: 1 .  Blood Pressure Monitoring (BLOOD PRESSURE KIT) DEVI, Use as directed to check blood pressure daily dx: I10, Disp: 1 each, Rfl: 1 .  carvedilol (COREG) 6.25 MG tablet, Take 1 tablet (6.25 mg total) by mouth 2 (two) times  daily with a meal., Disp: 60 tablet, Rfl: 0 .  docusate sodium (COLACE) 100 MG capsule, Take 1 capsule (100 mg total) by mouth 2 (two) times daily as needed for mild constipation., Disp: 20 capsule, Rfl: 0 .  doxycycline (VIBRA-TABS) 100 MG tablet, Take 1 tablet (100 mg total) by mouth every 12 (twelve) hours., Disp: 5 tablet, Rfl: 0 .  FEROSUL 325 (65 Fe) MG tablet, TAKE 1 TABLET BY MOUTH EVERY DAY WITH BREAKFAST (Patient taking differently: Take 325 mg by mouth daily.), Disp: 30 tablet, Rfl: 8 .  hydrALAZINE (APRESOLINE) 100 MG tablet, Take 1 tablet (100 mg total) by mouth every 8 (eight) hours., Disp: 90 tablet, Rfl: 0 .  mirtazapine (REMERON) 15 MG tablet, TAKE 1 TABLET BY MOUTH AT BEDTIME, Disp: 30  tablet, Rfl: 2 .  Multiple Vitamin (MULTIVITAMIN) capsule, Take 1 capsule by mouth daily., Disp: , Rfl:  .  pantoprazole (PROTONIX) 40 MG tablet, Take 1 tablet (40 mg total) by mouth daily., Disp: 30 tablet, Rfl: 0 .  predniSONE (DELTASONE) 5 MG tablet, Label  & dispense according to the schedule below. take 8 Pills PO for 3 days, 6 Pills PO for 3 days, 4 Pills PO for 3 days, 2 Pills PO for 3 days, 1 Pills PO for 3 days, 1/2 Pill  PO for 3 days then STOP. Total 65 pills., Disp: 65 tablet, Rfl: 0 .  traMADol (ULTRAM) 50 MG tablet, Take 1 tablet (50 mg total) by mouth every 6 (six) hours as needed. (Patient taking differently: Take 50 mg by mouth every 6 (six) hours as needed for moderate pain.), Disp: 20 tablet, Rfl: 0 .  hydrocortisone cream 1 %, Apply 1 application topically daily as needed for itching. (Patient not taking: Reported on 01/04/2021), Disp: , Rfl:  .  nicotine (NICODERM CQ - DOSED IN MG/24 HOURS) 21 mg/24hr patch, Place 1 patch (21 mg total) onto the skin daily., Disp: 30 patch, Rfl: 3   Allergies  Allergen Reactions  . Clonidine Derivatives Other (See Comments)    Patch causes scars  . Spironolactone Rash     Review of Systems  Constitutional: Positive for fatigue.  Respiratory: Positive for shortness of breath (with exertion).   Cardiovascular: Positive for leg swelling (bilateral extremity).  Neurological: Negative for dizziness and headaches.  Psychiatric/Behavioral: Negative.      Today's Vitals   01/04/21 1541  BP: 114/80  Pulse: 76  Temp: 98.3 F (36.8 C)  PainSc: 10-Worst pain ever  PainLoc: Leg   There is no height or weight on file to calculate BMI.   Objective:  Physical Exam Vitals reviewed.  Constitutional:      General: She is not in acute distress.    Appearance: Normal appearance.  Cardiovascular:     Rate and Rhythm: Normal rate and regular rhythm.     Pulses: Normal pulses.     Heart sounds: Normal heart sounds. No murmur  heard.   Pulmonary:     Effort: Pulmonary effort is normal. No respiratory distress.     Breath sounds: Normal breath sounds. No wheezing.  Musculoskeletal:        General: Tenderness (lower extremity) present.     Right lower leg: Edema (2+ ) present.     Left lower leg: Edema (2+) present.  Skin:    Capillary Refill: Capillary refill takes less than 2 seconds.     Comments: Scabbing noted to bilateral knees  Neurological:     General: No focal deficit present.  Mental Status: She is alert and oriented to person, place, and time.     Cranial Nerves: No cranial nerve deficit.     Motor: No weakness.  Psychiatric:        Mood and Affect: Mood normal.        Behavior: Behavior normal.        Thought Content: Thought content normal.        Judgment: Judgment normal.         Assessment And Plan:     1. Tobacco abuse  She has not had a cigarette in 3 weeks since being in the hospital  Will send rx for nicotine patches - nicotine (NICODERM CQ - DOSED IN MG/24 HOURS) 21 mg/24hr patch; Place 1 patch (21 mg total) onto the skin daily.  Dispense: 30 patch; Refill: 3  2. Lower extremity edema  She has 2+ pitting edema with tenderness  Advised to get support socks and keep bilateral feet elevated when possible - Brain natriuretic peptide  3. Vision disturbance  She is having difficulty with seeing out of her current glasses and has not seen an eye doctor in about 3 years - Ambulatory referral to Ophthalmology  4. Sepsis with acute respiratory failure, due to unspecified organism, unspecified whether hypoxia or hypercapnia present, unspecified whether septic shock present [facility])  TCM not done  She was admitted from 5/7 - 5/13 for sepsis, will recheck CXR and referral made to pulmonology  SPO2 was 99% at rest  Sent order for Home health nurse and Home health aide to help with ADLs  Will also do a change of status form for PCS due to worsening weakness - CBC -  Magnesium - DG Chest 2 View; Future - Ambulatory referral to Home Health  5. Acute gout, unspecified cause, unspecified site  She was treated for gout in the hospital her uric acid was normal there was a request to repeat in 1 week  No current pain  - CMP14+EGFR - Uric acid  6. Dyspnea on exertion  Her BNP was elevated while in the hospital I will recheck today and contact Dr Percival Spanish to see what he recommends  7. Lung nodule  This was found on her CT scan of chest is stable  She has a long history of smoking so I referred to pulmonology - Ambulatory referral to Pulmonology     Patient was given opportunity to ask questions. Patient verbalized understanding of the plan and was able to repeat key elements of the plan. All questions were answered to their satisfaction.  [name], FNP   I, [name], FNP, have reviewed all documentation for this visit. The documentation on 01/04/21 for the exam, diagnosis, procedures, and orders are all accurate and complete.   IF YOU HAVE BEEN REFERRED TO A SPECIALIST, IT MAY TAKE 1-2 WEEKS TO SCHEDULE/PROCESS THE REFERRAL. IF YOU HAVE NOT HEARD FROM US/SPECIALIST IN TWO WEEKS, PLEASE GIVE [place] A CALL AT [phone] X [phone].   THE PATIENT IS ENCOURAGED TO PRACTICE SOCIAL DISTANCING DUE TO THE COVID-19 PANDEMIC.

## 2021-01-05 ENCOUNTER — Ambulatory Visit: Payer: Self-pay | Admitting: Nurse Practitioner

## 2021-01-05 LAB — CMP14+EGFR
ALT: 63 IU/L — ABNORMAL HIGH (ref 0–32)
AST: 29 IU/L (ref 0–40)
Albumin/Globulin Ratio: 1.8 (ref 1.2–2.2)
Albumin: 4.5 g/dL (ref 3.8–4.8)
Alkaline Phosphatase: 131 IU/L — ABNORMAL HIGH (ref 44–121)
BUN/Creatinine Ratio: 26 (ref 12–28)
BUN: 23 mg/dL (ref 8–27)
Bilirubin Total: 0.4 mg/dL (ref 0.0–1.2)
CO2: 18 mmol/L — ABNORMAL LOW (ref 20–29)
Calcium: 9.4 mg/dL (ref 8.7–10.3)
Chloride: 103 mmol/L (ref 96–106)
Creatinine, Ser: 0.87 mg/dL (ref 0.57–1.00)
Globulin, Total: 2.5 g/dL (ref 1.5–4.5)
Glucose: 99 mg/dL (ref 65–99)
Potassium: 4.4 mmol/L (ref 3.5–5.2)
Sodium: 140 mmol/L (ref 134–144)
Total Protein: 7 g/dL (ref 6.0–8.5)
eGFR: 74 mL/min/{1.73_m2} (ref >59)

## 2021-01-05 LAB — CBC
Hematocrit: 40.9 % (ref 34.0–46.6)
Hemoglobin: 12.6 g/dL (ref 11.1–15.9)
MCH: 22.3 pg — ABNORMAL LOW (ref 26.6–33.0)
MCHC: 30.8 g/dL — ABNORMAL LOW (ref 31.5–35.7)
MCV: 72 fL — ABNORMAL LOW (ref 79–97)
Platelets: 307 10*3/uL (ref 150–450)
RBC: 5.65 x10E6/uL — ABNORMAL HIGH (ref 3.77–5.28)
RDW: 18.7 % — ABNORMAL HIGH (ref 11.7–15.4)
WBC: 7.8 10*3/uL (ref 3.4–10.8)

## 2021-01-05 LAB — BRAIN NATRIURETIC PEPTIDE: BNP: 585.7 pg/mL — ABNORMAL HIGH (ref 0.0–100.0)

## 2021-01-05 LAB — URIC ACID: Uric Acid: 4.9 mg/dL (ref 3.0–7.2)

## 2021-01-05 LAB — MAGNESIUM: Magnesium: 2.1 mg/dL (ref 1.6–2.3)

## 2021-01-06 ENCOUNTER — Ambulatory Visit: Payer: Medicaid Other

## 2021-01-06 DIAGNOSIS — R634 Abnormal weight loss: Secondary | ICD-10-CM

## 2021-01-06 DIAGNOSIS — F32A Depression, unspecified: Secondary | ICD-10-CM

## 2021-01-06 DIAGNOSIS — I10 Essential (primary) hypertension: Secondary | ICD-10-CM

## 2021-01-06 NOTE — Patient Instructions (Signed)
Visit Information  Goals Addressed            This Visit's Progress   . Work with SW to address barriers to treatment       Timeframe:  Long-Range Goal Priority:  Low Start Date:   1.14.22                          Expected End Date: 5.14.22                       Next planned outreach: 6.24.22  Patient Goals/Self-Care Activities Over the next 30 days, patient will: With the help of her sister  - Follow up with DSS to determine coverage related to a personal emergency response system - Review mailed resource information       The patient verbalized understanding of instructions, educational materials, and care plan provided today and declined offer to receive copy of patient instructions, educational materials, and care plan.   SW will follow up with the patient over the next month.  [name], BSW, CDP Social Worker, Certified Dementia Practitioner [place] [facility] [place] [facility] [phone]

## 2021-01-06 NOTE — Chronic Care Management (AMB) (Signed)
Social Work Note  01/06/2021 Name: Kimberly [name] MRN: [id number] DOB: [birth year]  Kimberly [name] is a 65 y.o. year old female who is a primary care patient of Kimberly [name], [place].  The Care Management team was consulted for assistance with chronic disease management and care coordination needs.  Kimberly [name] was given information about Care Management services today including:  1. Care Management services include personalized support from designated clinical staff supervised by her physician, including individualized plan of care and coordination with other care providers 2. 24/7 contact phone numbers for assistance for urgent and routine care needs. 3. The patient may stop care management services at any time (effective at the end of the month) by phone call to the office staff.  Patient agreed to services and consent obtained.   Engaged with patient suster by phone for follow up visit in response to provider referral for social work chronic care management and care coordination services.  Assessment: Review of patient past medical history, allergies, medications, and health status, including review of pertinent consultant reports was performed as part of comprehensive evaluation and provision of care management/care coordination services.   SDOH (Social Determinants of Health) assessments and interventions performed:  No    Advanced Directives Status: Not addressed in this encounter.  Care Plan  Allergies  Allergen Reactions  . Clonidine Derivatives Other (See Comments)    Patch causes scars  . Spironolactone Rash    Outpatient Encounter Medications as of 01/06/2021  Medication Sig  . acetaminophen (TYLENOL) 325 MG tablet Take 2 tablets (650 mg total) by mouth every 6 (six) hours as needed for moderate pain, mild pain, fever or headache.  [name] amLODipine (NORVASC) 10 MG tablet Take 1 tablet (10 mg total) by mouth daily.  [name] apixaban (ELIQUIS) 5 MG TABS tablet Take 1 tablet (5 mg  total) by mouth 2 (two) times daily.  [name] atorvastatin (LIPITOR) 80 MG tablet Take 1 tablet (80 mg total) by mouth daily.  . Blood Pressure Monitoring (BLOOD PRESSURE KIT) DEVI Use as directed to check blood pressure daily dx: I10  . carvedilol (COREG) 6.25 MG tablet Take 1 tablet (6.25 mg total) by mouth 2 (two) times daily with a meal.  . docusate sodium (COLACE) 100 MG capsule Take 1 capsule (100 mg total) by mouth 2 (two) times daily as needed for mild constipation.  [name] doxycycline (VIBRA-TABS) 100 MG tablet Take 1 tablet (100 mg total) by mouth every 12 (twelve) hours.  . FEROSUL 325 (65 Fe) MG tablet TAKE 1 TABLET BY MOUTH EVERY DAY WITH BREAKFAST (Patient taking differently: Take 325 mg by mouth daily.)  . hydrALAZINE (APRESOLINE) 100 MG tablet Take 1 tablet (100 mg total) by mouth every 8 (eight) hours.  . hydrocortisone cream 1 % Apply 1 application topically daily as needed for itching. (Patient not taking: Reported on 01/04/2021)  . mirtazapine (REMERON) 15 MG tablet TAKE 1 TABLET BY MOUTH AT BEDTIME  . Multiple Vitamin (MULTIVITAMIN) capsule Take 1 capsule by mouth daily.  . nicotine (NICODERM CQ - DOSED IN MG/24 HOURS) 21 mg/24hr patch Place 1 patch (21 mg total) onto the skin daily.  . pantoprazole (PROTONIX) 40 MG tablet Take 1 tablet (40 mg total) by mouth daily.  . predniSONE (DELTASONE) 5 MG tablet Label  & dispense according to the schedule below. take 8 Pills PO for 3 days, 6 Pills PO for 3 days, 4 Pills PO for 3 days, 2 Pills PO for 3 days, 1 Pills PO  for 3 days, 1/2 Pill  PO for 3 days then STOP. Total 65 pills.  . traMADol (ULTRAM) 50 MG tablet Take 1 tablet (50 mg total) by mouth every 6 (six) hours as needed. (Patient taking differently: Take 50 mg by mouth every 6 (six) hours as needed for moderate pain.)   No facility-administered encounter medications on file as of 01/06/2021.    Patient Active Problem List   Diagnosis Date Noted  . Sepsis ([place]) 12/18/2020  . Family  history of malignant neoplasm of gastrointestinal tract 12/07/2020  . Personal history of colonic polyps 12/07/2020  . Secondary hypercoagulable state ([place]) 10/14/2020  . Right sided abdominal pain   . Abdominal wall pain   . Atrial fibrillation ([place]) 08/19/2020  . Educated about COVID-19 virus infection 11/16/2019  . Urinary frequency 04/14/2019  . Cough 04/14/2019  . AKI (acute kidney injury) ([place]) 11/26/2018  . Diarrhea 11/25/2018  . Dehydration, mild 11/25/2018  . Hemiparesis affecting nondominant 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 ([place]) 07/31/2018  . Muscle ache 07/31/2018  . Prediabetes 07/31/2018  . Chronic gout without tophus 07/31/2018  . Vision loss 07/31/2018  . Malnutrition of moderate degree 02/15/2018  . Carotid occlusion, bilateral   . Atrial fibrillation with RVR ([place]) 02/13/2018  . Positive D dimer 02/13/2018  . Back pain 02/13/2018  . CVA (cerebral vascular accident) ([place]) 02/13/2018  . Paroxysmal atrial fibrillation ([place]) 02/13/2018  . Coronary artery disease involving native coronary artery of native heart without angina pectoris 02/13/2018  . 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 Aortic atherosclerosis ([place]) 02/13/2018  . CAD, ARTERY BYPASS GRAFT 05/03/2009  . TOBACCO USE, QUIT 05/03/2009  . ROTATOR CUFF SYNDROME, LEFT 02/12/2009  . Shoulder pain, left 11/03/2008  . HAMMER TOE, ACQUIRED 02/11/2008  . INSECT BITE, LEG 02/11/2008  . Cerebral artery occlusion with cerebral infarction ([place]) 12/05/2007  . Dyslipidemia 11/05/2007  . UNSPECIFIED DISORDER TEETH&SUPPORTING STRUCTURES 11/05/2007  . BICEPS TENDINITIS, RIGHT 04/24/2007  . Essential hypertension 04/23/2007  . MICROCYTOSIS 04/23/2007    Conditions to be addressed/monitored: HTN, Depression and abnormal weightloss; High fall risk - in need of a personal emergency response system  Care Plan : Social Work [facility] Plan of  Care  Updates made by Kimberly [name] since 01/06/2021 12:00 AM    Problem: Barriers to Treatment     Long-Range Goal: Barriers to Treatment Identified and Managed   Start Date: 08/27/2020  Expected End Date: 12/25/2020  Recent Progress: On track  Priority: Low  Note:   Current Barriers:  . Limited social support . Level of care concerns . ADL IADL limitations . Limited access to caregiver . Abnormal weight loss . Chronic conditions including HTN and Depression which put patient at increased risk of hospitalization  Social Work Clinical Goal(s):  [name] Over the next 120 days the patient will work with SW to address care coordination needs  . Over the next 60 days the patient and her sister will work with SW to identify resources to obtain a personal emergency response system  Interventions: . 1:1 collaboration with Kimberly [name], [place] regarding development and update of comprehensive plan of care as evidenced by provider attestation and co-signature . Inter-disciplinary care team collaboration (see longitudinal plan of care) . Collaboration with patients primary provider who indicates patient is having difficulty identifying a dentist who is in network with her health plan . Successful outbound call placed to the patients sister, Kimberly [name], to assist with  care coordination needs . Discussed the patient is a high fall risk and would benefit from a personal emergency response system . Attempted to assist [facility] with contacting DSS to determine if the patients Medicaid would cover a system - after multiple minutes on hold Kimberly [name] reports she will visit DSS in person . Educated Kimberly [name] on [employer] plan benefits that sometimes include covering a personal emergency response system considering the patient is eligible to enroll after her next birthday . Provided verbal and written information on the [place] program . Scheduled follow up call over the next month  Patient Goals/Self-Care  Activities Over the next 30 days, patient will: With the help of her sister  -  Follow up with DSS to determine coverage related to a personal emergency response system - Review mailed resource information  Follow up Plan: SW will follow up with patient by phone over the next 30 days       Follow Up Plan: SW will follow up with patient by phone over the next month      Kimberly [name], BSW, CDP Social Worker, Certified Dementia Practitioner [place] [facility] [place] [facility] [phone]

## 2021-01-12 ENCOUNTER — Other Ambulatory Visit: Payer: Self-pay | Admitting: Nurse Practitioner

## 2021-01-12 DIAGNOSIS — R7989 Other specified abnormal findings of blood chemistry: Secondary | ICD-10-CM

## 2021-01-12 DIAGNOSIS — R600 Localized edema: Secondary | ICD-10-CM

## 2021-01-12 MED ORDER — FUROSEMIDE 20 MG PO TABS: ORAL_TABLET | ORAL | 0 refills | Status: DC

## 2021-01-13 ENCOUNTER — Other Ambulatory Visit: Payer: Self-pay | Admitting: Nurse Practitioner

## 2021-01-13 DIAGNOSIS — R600 Localized edema: Secondary | ICD-10-CM

## 2021-01-13 DIAGNOSIS — R7989 Other specified abnormal findings of blood chemistry: Secondary | ICD-10-CM

## 2021-01-13 MED ORDER — FUROSEMIDE 20 MG PO TABS
ORAL_TABLET | ORAL | 0 refills | Status: DC
Start: 2021-01-13 — End: 2022-02-07

## 2021-01-21 ENCOUNTER — Other Ambulatory Visit: Payer: Self-pay | Admitting: Nurse Practitioner

## 2021-01-24 ENCOUNTER — Other Ambulatory Visit: Payer: Self-pay

## 2021-01-24 ENCOUNTER — Other Ambulatory Visit: Payer: Self-pay | Admitting: Nurse Practitioner

## 2021-01-24 MED ORDER — ELIQUIS 5 MG PO TABS: 5.0000 mg | ORAL_TABLET | Freq: Two times a day (BID) | ORAL | 3 refills | Status: DC

## 2021-01-25 ENCOUNTER — Other Ambulatory Visit: Payer: Self-pay

## 2021-01-25 ENCOUNTER — Ambulatory Visit (INDEPENDENT_AMBULATORY_CARE_PROVIDER_SITE_OTHER): Payer: Medicaid Other | Admitting: Emergency Medicine

## 2021-01-25 ENCOUNTER — Encounter: Payer: Self-pay | Admitting: Emergency Medicine

## 2021-01-25 DIAGNOSIS — R911 Solitary pulmonary nodule: Secondary | ICD-10-CM | POA: Diagnosis not present

## 2021-01-25 DIAGNOSIS — Z87891 Personal history of nicotine dependence: Secondary | ICD-10-CM | POA: Diagnosis not present

## 2021-01-25 DIAGNOSIS — R0602 Shortness of breath: Secondary | ICD-10-CM

## 2021-01-25 MED ORDER — STIOLTO RESPIMAT 2.5-2.5 MCG/ACT IN AERS: 2.0000 | INHALATION_SPRAY | Freq: Every day | RESPIRATORY_TRACT | 0 refills | Status: DC

## 2021-01-25 NOTE — Assessment & Plan Note (Signed)
Congratulated her on cessation.  She is using nicotine patches 21 mg daily.  Discussed weaning the dosage with her.

## 2021-01-25 NOTE — Patient Instructions (Addendum)
We will plan to repeat your CT scan of the chest in 12/2021 to follow small pulmonary nodule. We will do a trial of starting Stiolto 2 puffs once daily to see if this benefits her breathing.  If so then please call our office and we will continue it going forward, order through your pharmacy. Congratulations on stopping smoking!  You may be able to start weaning the dose of your nicotine patches.  Ultimate goal would be to wean to off. Follow Dr. Lamonte Sakai in 12 months to review her CT chest or sooner if you have any breathing difficulty.

## 2021-01-25 NOTE — Addendum Note (Signed)
Addended by: Gavin Potters R on: 01/25/2021 12:24 PM   Modules accepted: Orders

## 2021-01-25 NOTE — Progress Notes (Signed)
Subjective:    Patient ID: Kimberly [name], female    DOB: [birth year], 65 y.o.   MRN: [id number]  HPI 65 year old woman with history of tobacco use (70 pack years), CAD/CABG with hypertension, atrial fibrillation on anticoagulation hyperlipidemia.  Also history of CVA x2 with residual left-sided weakness, GERD, ascending aortic aneurysm.  She carries a history of presumed COPD, no PFT available to review.  Not currently on any bronchodilator therapy.   She was hospitalized last month for near syncope and a fall.  She was treated with empiric antibiotics for possible sepsis.  Course complicated by transient atelectasis and some hypoxemia that resolved. She is still requiring assistance with all her activity, has not been using a walker. She has baseline SOB at rest and especially w transfers, activity. Can hear some wheeze at night. No cough.   She had a CT scan of the chest 12/18/2020 during that hospitalization which I have reviewed, shows aortic calcifications, of the ascending thoracic aortic dilation, enlarged PA, 5 mm noncalcified left lower lobe pulmonary nodule new from 09/2019.   Review of Systems As per HPI  Past Medical History:  Diagnosis Date   Bicipital tenosynovitis    Coronary artery disease 05/2007   [id number]    Fall 02/13/2018   Family history of ischemic heart disease    Gout    Hammer toe    Hip, thigh, leg, and ankle, insect bite, nonvenomous, without mention of infection(916.4)    Hyperlipidemia    Hypertension    Other abnormality of red blood cells    Rotator cuff syndrome of left shoulder    Stroke ([place])      Family History  Problem Relation Age of Onset   Hypertension Mother    Heart attack Father    Diabetes Father    Kidney failure Father    Anxiety disorder Sister    Sarcoidosis Sister      Social History   Socioeconomic History   Marital status: Single    Spouse name: Not on file   Number of children: 2   Years of education: Not on file    Highest education level: 11th grade  Occupational History   Occupation: retired  Tobacco Use   Smoking status: Former    Packs/day: 2.00    Years: 35.00    Pack years: 70.00    Types: Cigarettes    Quit date: 08/12/2019    Years since quitting: 1.4   Smokeless tobacco: Former    Quit date: 08/09/2008   Tobacco comments:    Stopped smoking in May 2022 [facility]   Vaping Use   Vaping Use: Never used  Substance and Sexual Activity   Alcohol use: Not Currently    Comment: ocassionally wine   Drug use: Not Currently    Types: Marijuana    Comment: history of cocaine use   Sexual activity: Not on file  Other Topics Concern   Not on file  Social History Narrative   Patient is right-handed. She lives alone in a one level home, 2 steps to enter. Her family is very active in her care.    Social Determinants of Health   Financial Resource Strain: Not on file  Food Insecurity: Not on file  Transportation Needs: Not on file  Physical Activity: Not on file  Stress: Not on file  Social Connections: Not on file  Intimate Partner Violence: Not on file     Allergies  Allergen Reactions   Clonidine Derivatives Other (  See Comments)    Patch causes scars   Spironolactone Rash     Outpatient Medications Prior to Visit  Medication Sig Dispense Refill   amLODipine (NORVASC) 10 MG tablet TAKE 1 TABLET BY MOUTH EVERY DAY 30 tablet 0   apixaban (ELIQUIS) 5 MG TABS tablet Take 1 tablet (5 mg total) by mouth 2 (two) times daily. 180 tablet 3   atorvastatin (LIPITOR) 80 MG tablet Take 1 tablet (80 mg total) by mouth daily. 90 tablet 1   carvedilol (COREG) 6.25 MG tablet TAKE 1 TABLET BY MOUTH 2 TIMES DAILY WITH A MEAL 60 tablet 0   docusate sodium (COLACE) 100 MG capsule Take 1 capsule (100 mg total) by mouth 2 (two) times daily as needed for mild constipation. 20 capsule 0   doxycycline (VIBRA-TABS) 100 MG tablet TAKE 1 TABLET BY MOUTH EVERY TWELVE HOURS 5 tablet 0   FEROSUL 325 (65 Fe) MG tablet  TAKE 1 TABLET BY MOUTH EVERY DAY WITH BREAKFAST (Patient taking differently: Take 325 mg by mouth daily.) 30 tablet 8   hydrALAZINE (APRESOLINE) 100 MG tablet TAKE 1 TABLET BY MOUTH EVERY 8 HOURS 90 tablet 0   mirtazapine (REMERON) 15 MG tablet TAKE 1 TABLET BY MOUTH AT BEDTIME 30 tablet 2   Multiple Vitamin (MULTIVITAMIN) capsule Take 1 capsule by mouth daily.     nicotine (NICODERM CQ - DOSED IN MG/24 HOURS) 21 mg/24hr patch Place 1 patch (21 mg total) onto the skin daily. 30 patch 3   pantoprazole (PROTONIX) 40 MG tablet TAKE 1 TABLET BY MOUTH EVERY DAY 30 tablet 0   predniSONE (DELTASONE) 5 MG tablet Label  & dispense according to the schedule below. take 8 Pills PO for 3 days, 6 Pills PO for 3 days, 4 Pills PO for 3 days, 2 Pills PO for 3 days, 1 Pills PO for 3 days, 1/2 Pill  PO for 3 days then STOP. Total 65 pills. 65 tablet 0   traMADol (ULTRAM) 50 MG tablet Take 1 tablet (50 mg total) by mouth every 6 (six) hours as needed. (Patient taking differently: Take 50 mg by mouth every 6 (six) hours as needed for moderate pain.) 20 tablet 0   acetaminophen (TYLENOL) 325 MG tablet Take 2 tablets (650 mg total) by mouth every 6 (six) hours as needed for moderate pain, mild pain, fever or headache. 20 tablet 0   Blood Pressure Monitoring (BLOOD PRESSURE KIT) DEVI Use as directed to check blood pressure daily dx: I10 1 each 1   furosemide (LASIX) 20 MG tablet Take 20 mg by mouth daily x 3 days (Patient not taking: Reported on 01/25/2021) 30 tablet 0   hydrocortisone cream 1 % Apply 1 application topically daily as needed for itching. (Patient not taking: Reported on 01/04/2021)     No facility-administered medications prior to visit.          Objective:   Physical Exam Vitals:   01/25/21 1044  BP: 112/60  Pulse: 70  Temp: (!) 97.4 F (36.3 C)  TempSrc: Temporal  SpO2: 100%  Weight: 122 lb 6.4 oz (55.5 kg)  Height: _0  (1.702 m)    Gen: Pleasant, thin, in no distress,  normal affect, in  a   ENT: No lesions,  mouth clear,  oropharynx clear, no postnasal drip  Neck: No JVD, no stridor  Lungs: No use of accessory muscles, no crackles or wheezing on normal respiration, no wheeze on forced expiration  Cardiovascular: RRR, heart sounds normal, no  murmur or gallops, no peripheral edema  Musculoskeletal: No deformities, no cyanosis or clubbing  Neuro: alert, awake, non focal  Skin: Warm, no lesions or rash      Assessment & Plan:  TOBACCO USE, QUIT Congratulated her on cessation.  She is using nicotine patches 21 mg daily.  Discussed weaning the dosage with her.  Pulmonary nodule Small 5 mm left lower lobe pulmonary nodule of unclear significance.  We discussed the possibility that this could reflect early malignancy and that it will need to be followed.  We will plan for her next CT chest in May 2023, follow-up to review.  Shortness of breath Multifactorial with likely contribution of her cardiac disease but certainly given her tobacco history probable COPD.  We talked today about a therapeutic trial of a bronchodilator to see if she gets clinical benefit.  We will try Stiolto.  If she does benefit then we will continue.    Baltazar Apo, MD, PhD 01/25/2021, 11:13 AM [facility] [phone] or if no answer before 7:00PM call [phone] For any issues after 7:00PM please call eLink [phone]

## 2021-01-25 NOTE — Assessment & Plan Note (Signed)
Small 5 mm left lower lobe pulmonary nodule of unclear significance.  We discussed the possibility that this could reflect early malignancy and that it will need to be followed.  We will plan for her next CT chest in May 2023, follow-up to review.

## 2021-01-25 NOTE — Assessment & Plan Note (Signed)
Multifactorial with likely contribution of her cardiac disease but certainly given her tobacco history probable COPD.  We talked today about a therapeutic trial of a bronchodilator to see if she gets clinical benefit.  We will try Stiolto.  If she does benefit then we will continue.

## 2021-01-26 ENCOUNTER — Telehealth: Payer: Self-pay

## 2021-01-26 NOTE — Chronic Care Management (AMB) (Signed)
  Patient's sister is aware of telephone appointment with [name] on 01-27-2021 at 9:00.Patient aware to have/bring all medications, supplements, blood pressure and/or blood sugar logs to visit.  Questions: Have you had any recent office visit or specialist visit outside of [place]? Patients sister states No   Are there any concerns you would like to discuss during your office visit? Patient sister states patient declined after being discharged from the hospital and was recommended to be in a facility but family decided to take care of her at home. Patient's sister states patient is very depressed.  Are you having any problems obtaining your medications? (Whether it pharmacy issues or cost) Patient's sister states no issues with getting medications.  If patient has any PAP medications ask if they are having any problems getting their PAP medication or refill? No PAP medications  Star Rating Drug: Atorvastatin 80mg - Last filled 01-21-2021 30 [facility]  Any gaps in medications fill history? No   [place] Pharmacist Assistant [phone]

## 2021-01-27 ENCOUNTER — Ambulatory Visit: Payer: Medicaid Other

## 2021-01-27 DIAGNOSIS — I2510 Atherosclerotic heart disease of native coronary artery without angina pectoris: Secondary | ICD-10-CM

## 2021-01-27 DIAGNOSIS — I10 Essential (primary) hypertension: Secondary | ICD-10-CM

## 2021-01-27 NOTE — Progress Notes (Signed)
Chronic Care Management Pharmacy Note  02/15/2021 Name:  Kimberly [name] MRN:  [id number] DOB:  1956/01/30  Summary: Patients sister reports that they wanted her sister to go to rehab but at this time the family is not ready for her to go to a nursing home.   Recommendations/Changes made from today's visit: Recommend patient  receive COVID-19 Booster and shingrix vaccine   Plan: Sister to help patient with checking her BP at least three times per week.  Patient to receive COVID-19 booster and shingles vaccine.    Subjective: Kimberly [name] is an 65 y.o. year old female who is a primary patient of [name], [place].  The CCM team was consulted for assistance with disease management and care coordination needs.    Engaged with patient by telephone for follow up visit in response to provider referral for pharmacy case management and/or care coordination services. Patients sister reports that they decided let her daughter stay home despite the doctors preference for her to be in a long term care facility. She is currently in a wheel chair most of the time. She reports that her sister has fallen a couple times but not to the point of hurting her.   Consent to Services:  The patient was given information about Chronic Care Management services, agreed to services, and gave verbal consent prior to initiation of services.  Please see initial visit note for detailed documentation.   Patient Care Team: [name], [place] as PCP - General ([place]) [name], MD as PCP - Cardiology (Cardiology) Verl Blalock, Marijo Conception, MD (Inactive) (Cardiology) Rex Kras, Claudette Stapler, RN as Case Manager Pieter Partridge, DO as Consulting Physician (Neurology) [name] as Social Worker Mayford Knife, [facility] (Pharmacist)  Recent office visits: 01/04/2021 PCP OV: no changes   Recent consult visits: 01/25/2021 Pulmonology OV: Started on Stiolto: 2 puffs once daily  Hospital visits: 12/16/2020 Hospital  OV    Objective:  Lab Results  Component Value Date   CREATININE 0.87 01/04/2021   BUN 23 01/04/2021   GFRNONAA 57 (L) 12/21/2020   GFRAA >60 04/27/2020   NA 140 01/04/2021   K 4.4 01/04/2021   CALCIUM 9.4 01/04/2021   CO2 18 (L) 01/04/2021   GLUCOSE 99 01/04/2021    Lab Results  Component Value Date/Time   HGBA1C 5.5 09/02/2019 04:51 PM   HGBA1C 5.9 (H) 07/24/2018 11:58 AM    Last diabetic Eye exam: No results found for: HMDIABEYEEXA  Last diabetic Foot exam: No results found for: HMDIABFOOTEX   Lab Results  Component Value Date   CHOL 114 09/02/2019   HDL 47 09/02/2019   LDLCALC 54 09/02/2019   TRIG 58 09/02/2019   CHOLHDL 2.4 09/02/2019    Hepatic Function Latest Ref Rng & Units 01/04/2021 12/21/2020 12/20/2020  Total Protein 6.0 - 8.5 g/dL 7.0 5.3(L) 5.5(L)  Albumin 3.8 - 4.8 g/dL 4.5 2.6(L) 2.8(L)  AST 0 - 40 IU/L _0 ALT 0 - 32 IU/L 63(H) 33 36  Alk Phosphatase 44 - 121 IU/L 131(H) 90 93  Total Bilirubin 0.0 - 1.2 mg/dL 0.4 0.6 0.4  Bilirubin, Direct 0.0 - 0.3 mg/dL - - -    Lab Results  Component Value Date/Time   TSH 0.897 08/19/2020 09:11 AM   TSH 1.550 12/02/2019 02:23 PM   TSH 1.245 02/13/2018 11:25 AM   TSH 1.466 11/24/2008 09:25 PM    CBC Latest Ref Rng & Units 01/04/2021 12/21/2020 12/20/2020  WBC 3.4 -  10.8 x10E3/uL 7.8 11.4(H) 7.5  Hemoglobin 11.1 - 15.9 g/dL 12.6 10.0(L) 10.0(L)  Hematocrit 34.0 - 46.6 % 40.9 31.7(L) 31.7(L)  Platelets 150 - 450 x10E3/uL 307 187 179    No results found for: VD25OH  Clinical ASCVD: Yes  The ASCVD Risk score Mikey Bussing DC Jr., et al., 2013) failed to calculate for the following reasons:   The patient has a prior MI or stroke diagnosis    Depression screen [facility] 2/9 03/30/2020 12/02/2019 12/02/2019  Decreased Interest 1 0 0  Down, Depressed, Hopeless 1 1 0  PHQ - 2 Score 2 1 0  Altered sleeping 3 0 -  Tired, decreased energy 3 1 -  Change in appetite 3 1 -  Feeling bad or failure about yourself  1 1 -   Trouble concentrating 1 1 -  Moving slowly or fidgety/restless 3 1 -  Suicidal thoughts 0 0 -  PHQ-9 Score 16 6 -  Difficult doing work/chores Somewhat difficult Somewhat difficult -  Some recent data might be hidden     CHA2DS2-VASc Score = 5  The patient's score is based upon: CHF History: No HTN History: Yes Diabetes History: No Stroke History: Yes Vascular Disease History: Yes Age Score: 0 Gender Score: 1     ASSESSMENT AND PLAN: Paroxysmal Atrial Fibrillation (ICD10:  I48.0) The patient's CHA2DS2-VASc score is 5, indicating a 7.2% annual risk of stroke.    The patient is at significant risk for stroke/thromboembolism based upon her CHA2DS2-VASc Score of 5.  Continue Apixaban (Eliquis).    Beryle Quant, [facility]    02/15/2021 5:07 PM      Social History   Tobacco Use  Smoking Status Former   Packs/day: 2.00   Years: 35.00   Pack years: 70.00   Types: Cigarettes   Quit date: 08/12/2019   Years since quitting: 1.5  Smokeless Tobacco Former   Quit date: 08/09/2008  Tobacco Comments   Stopped smoking in May 2022 [facility]    BP Readings from Last 3 Encounters:  02/10/21 99/69  01/25/21 112/60  01/04/21 114/80   Pulse Readings from Last 3 Encounters:  02/10/21 (!) 58  01/25/21 70  01/04/21 76   Wt Readings from Last 3 Encounters:  02/10/21 118 lb 12.8 oz (53.9 kg)  01/25/21 122 lb 6.4 oz (55.5 kg)  12/18/20 130 lb (59 kg)   BMI Readings from Last 3 Encounters:  02/10/21 18.61 kg/m  01/25/21 19.17 kg/m  12/18/20 20.36 kg/m    Assessment/Interventions: Review of patient past medical history, allergies, medications, health status, including review of consultants reports, laboratory and other test data, was performed as part of comprehensive evaluation and provision of chronic care management services.   SDOH:  (Social Determinants of Health) assessments and interventions performed: No  SDOH Screenings   Alcohol Screen: Not on file   Depression (PHQ2-9): Medium Risk   PHQ-2 Score: 16  Financial Resource Strain: Not on file  Food Insecurity: Not on file  Housing: Not on file  Physical Activity: Not on file  Social Connections: Not on file  Stress: Not on file  Tobacco Use: Medium Risk   Smoking Tobacco Use: Former   Smokeless Tobacco Use: Former  Transport planner Needs: Not on file    [place]  Allergies  Allergen Reactions   Clonidine Derivatives Other (See Comments)    Patch causes scars   Spironolactone Rash    Medications Reviewed Today     Reviewed by [name], MD (  Physician) on 02/11/21 at 1715  Med List Status: <None>   Medication Order Taking? Sig Documenting Provider Last Dose Status Informant  acetaminophen (TYLENOL) 325 MG tablet [id number] Yes Take 2 tablets (650 mg total) by mouth every 6 (six) hours as needed for moderate pain, mild pain, fever or headache. Thurnell Lose, MD Taking Active   amLODipine (NORVASC) 10 MG tablet [id number] Yes TAKE 1 TABLET BY MOUTH EVERY DAY [name], FNP Taking Active   apixaban (ELIQUIS) 5 MG TABS tablet [id number] Yes Take 1 tablet (5 mg total) by mouth 2 (two) times daily. [name], FNP Taking Active   atorvastatin (LIPITOR) 80 MG tablet [id number] Yes Take 1 tablet (80 mg total) by mouth daily. [name], FNP Taking Active Family Member  carvedilol (COREG) 6.25 MG tablet [id number] Yes TAKE 1 TABLET BY MOUTH 2 TIMES DAILY WITH A MEAL [name], FNP Taking Active   docusate sodium (COLACE) 100 MG capsule [id number] Yes Take 1 capsule (100 mg total) by mouth 2 (two) times daily as needed for mild constipation. Thurnell Lose, MD Taking Active   doxycycline (VIBRA-TABS) 100 MG tablet [id number] Yes TAKE 1 TABLET BY MOUTH EVERY TWELVE HOURS [name], FNP Taking Active   FEROSUL 325 (65 Fe) MG tablet [id number] Yes TAKE 1 TABLET BY MOUTH EVERY DAY WITH BREAKFAST  Patient taking differently: Take 325 mg by mouth daily.   [name], MD Taking Active   furosemide (LASIX) 20 MG tablet [id number] Yes Take 20 mg by mouth daily x 3 days [name], FNP Taking Active   hydrALAZINE (APRESOLINE) 50 MG tablet [id number] Yes Take 1 tablet (50 mg total) by mouth 3 (three) times daily. [name], MD  Active   hydrocortisone cream 1 % [id number] Yes Apply 1 application topically daily as needed for itching. [provider] Taking Active   mirtazapine (REMERON) 15 MG tablet [id number] Yes TAKE 1 TABLET BY MOUTH AT BEDTIME [name], FNP Taking Active   Multiple Vitamin (MULTIVITAMIN) capsule [id number] Yes Take 1 capsule by mouth daily. [provider] Taking Active Family Member  nicotine (NICODERM CQ - DOSED IN MG/24 HOURS) 21 mg/24hr patch [id number] Yes Place 1 patch (21 mg total) onto the skin daily. [name], FNP Taking Active   pantoprazole (PROTONIX) 40 MG tablet [id number] Yes TAKE 1 TABLET BY MOUTH EVERY DAY [name], FNP Taking Active   predniSONE (DELTASONE) 5 MG tablet [id number] Yes Label  & dispense according to the schedule below. take 8 Pills PO for 3 days, 6 Pills PO for 3 days, 4 Pills PO for 3 days, 2 Pills PO for 3 days, 1 Pills PO for 3 days, 1/2 Pill  PO for 3 days then STOP. Total 65 pills. Thurnell Lose, MD Taking Active   Tiotropium Bromide-Olodaterol (STIOLTO RESPIMAT) 2.5-2.5 MCG/ACT AERS [id number] Yes Inhale 2 puffs into the lungs daily. Collene Gobble, MD Taking Active   traMADol Veatrice Bourbon) 50 MG tablet [id number] Yes Take 1 tablet (50 mg total) by mouth every 6 (six) hours as needed.  Patient taking differently: Take 50 mg by mouth every 6 (six) hours as needed for moderate pain.   [name], FNP Taking Active   Med List Note Carie Caddy 12/18/20 2051): Obtained med hx from sister             Patient Active Problem List   Diagnosis Date Noted   Pulmonary nodule 01/25/2021   Shortness of breath 01/25/2021   Sepsis (  [place]) 12/18/2020   Family  history of malignant neoplasm of gastrointestinal tract 12/07/2020   Personal history of colonic polyps 12/07/2020   Secondary hypercoagulable state ([place]) 10/14/2020   Right sided abdominal pain    Abdominal wall pain    Atrial fibrillation ([place]) 08/19/2020   Educated about COVID-19 virus infection 11/16/2019   Urinary frequency 04/14/2019   Cough 04/14/2019   AKI (acute kidney injury) ([place]) 11/26/2018   Diarrhea 11/25/2018   Dehydration, mild 11/25/2018   Hemiparesis affecting nondominant 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 ([place]) 07/31/2018   Muscle ache 07/31/2018   Prediabetes 07/31/2018   Chronic gout without tophus 07/31/2018   Vision loss 07/31/2018   Malnutrition of moderate degree 02/15/2018   Carotid occlusion, bilateral    Atrial fibrillation with RVR ([place]) 02/13/2018   Positive D dimer 02/13/2018   Back pain 02/13/2018   CVA (cerebral vascular accident) ([place]) 02/13/2018   Paroxysmal atrial fibrillation ([place]) 02/13/2018   Coronary artery disease involving native coronary artery of native heart without angina pectoris 02/13/2018   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H[facility]) 02/13/2018   Aortic atherosclerosis ([place]) 02/13/2018   CAD, ARTERY BYPASS GRAFT 05/03/2009   TOBACCO USE, QUIT 05/03/2009   ROTATOR CUFF SYNDROME, LEFT 02/12/2009   Shoulder pain, left 11/03/2008   HAMMER TOE, ACQUIRED 02/11/2008   INSECT BITE, LEG 02/11/2008   Cerebral artery occlusion with cerebral infarction ([place]) 12/05/2007   Dyslipidemia 11/05/2007   UNSPECIFIED DISORDER TEETH&SUPPORTING STRUCTURES 11/05/2007   BICEPS TENDINITIS, RIGHT 04/24/2007   Essential hypertension 04/23/2007   MICROCYTOSIS 04/23/2007    Immunization History  Administered Date(s) Administered   Influenza Split 06/24/2016   Influenza,inj,Quad PF,6+ Mos 07/29/2013, 06/17/2017, 05/17/2020   Moderna SARS-COV2 Booster Vaccination 09/30/2020   Moderna  Sars-Covid-2 Vaccination 01/23/2020, 02/20/2020    Conditions to be addressed/monitored:  Hypertension and Hyperlipidemia  Care Plan : [place]  Updates made by Mayford Knife, [place] since 02/15/2021 12:00 AM     Problem: HTN, CAD      Goal: Disease Management   Note:     Current Barriers:  Unable to independently monitor therapeutic efficacy  Pharmacist Clinical Goal(s):  Patient will achieve adherence to monitoring guidelines and medication adherence to achieve therapeutic efficacy through collaboration with PharmD and provider.   Interventions: 1:1 collaboration with [name], FNP regarding development and update of comprehensive plan of care as evidenced by provider attestation and co-signature Inter-disciplinary care team collaboration (see longitudinal plan of care) Comprehensive medication review performed; medication list updated in electronic medical record  Hypertension (BP goal <130/80) -Controlled -Current treatment: Amlodipine 10 mg taking 1 tablet by mouth daily Hydralazine 50 mg taking 1 tablet by mouth three times daily Carvedilol 6.25 mg taking 1 tablet by mouth 2 times daily  -Current home readings: none noted  -Current dietary habits: patients sister reports they are trying to monitor her BP more closely -Current exercise habits: at this time the patient is not exercising  -Denies hypotensive/hypertensive symptoms -Educated on Importance of home blood pressure monitoring; Proper BP monitoring technique; -Counseled to monitor BP at home three times per week, document, and provide log at future appointments -Recommended to continue current medication  Coronary Artery Disease/Hyperlipidemia: (LDL goal < 70) -Controlled -Current treatment: Atorvastatin 80 mg tablet once per day Furosemide 20 mg tablet once per day Eliquis 5 mg tablet twice per day Carvedilol 6.25 mg tablet twice per day Amlodipine 10 mg tablet daily Hydralazine 50  mg tablet three times daily -Current dietary patterns: patients sister reports that they are trying to monitor her eating habits -Current exercise habits: None at this time  -Educated on Cholesterol goals;  Importance of limiting foods high in cholesterol; -Recommended to continue current medication  Patient Goals/Self-Care Activities Patient will:  - take medications as prescribed  Follow Up Plan: The patient has been provided with contact information for the care management team and has been advised to call with any health related questions or concerns.        Medication Assistance: None required.  Patient affirms current coverage meets needs.  Compliance/Adherence/Medication fill history: Care Gaps: Shingrix COVID -19: 2 nd booster   Star-Rating Drugs: Atorvastatin 80 mg   Patient's preferred pharmacy is:  [facility] [address] [place] [address] Phone: [phone] Fax: [place], [place] [address] Phone: [phone] Fax: [phone]  [place], [place] - [address] [place] [address] Phone: [phone] Fax: [phone]  [facility] - [place], [place] - [address] [place] [address] [place] [address] Phone: [phone] Fax: [phone]  [id number] - [place], [place] - [address] [place] [place] [address] Phone: [phone] Fax: [phone]  Uses pill box? Yes Pt endorses 80% compliance  We discussed: Benefits of medication synchronization, packaging and delivery as well as enhanced pharmacist oversight with [facility]. Patient decided to: Continue current medication management strategy  Care Plan and Follow Up Patient Decision:  Patient agrees to Care Plan and  Follow-up.  Plan: The patient has been provided with contact information for the care management team and has been advised to call with any health related questions or concerns.   [name], PharmD Clinical Pharmacist [facility] [phone]

## 2021-02-04 ENCOUNTER — Telehealth: Payer: Self-pay

## 2021-02-04 ENCOUNTER — Telehealth: Payer: Medicaid Other

## 2021-02-04 NOTE — Telephone Encounter (Signed)
  Care Management   Follow Up Note   02/04/2021 Name: Kimberly [name] MRN: [id number] DOB: August 06, 1956   Referred by: [name], FNP Reason for referral : Care Coordination (Unsuccessful call)   An unsuccessful telephone outreach was attempted today. The patient was referred to the case management team for assistance with care management and care coordination. SW left a HIPAA compliant voice message requesting a return call.  Follow Up Plan: The care management team will reach out to the patient again over the next 21 days.   [name], BSW, CDP Social Worker, Certified Dementia Practitioner [place] [facility] [place] [facility] [phone]

## 2021-02-08 ENCOUNTER — Ambulatory Visit: Payer: Medicaid Other | Admitting: Nurse Practitioner

## 2021-02-08 DIAGNOSIS — J9600 Acute respiratory failure, unspecified whether with hypoxia or hypercapnia: Secondary | ICD-10-CM

## 2021-02-08 DIAGNOSIS — A419 Sepsis, unspecified organism: Secondary | ICD-10-CM

## 2021-02-08 DIAGNOSIS — M109 Gout, unspecified: Secondary | ICD-10-CM

## 2021-02-08 DIAGNOSIS — I10 Essential (primary) hypertension: Secondary | ICD-10-CM

## 2021-02-08 DIAGNOSIS — R600 Localized edema: Secondary | ICD-10-CM

## 2021-02-08 DIAGNOSIS — E860 Dehydration: Secondary | ICD-10-CM

## 2021-02-09 ENCOUNTER — Other Ambulatory Visit: Payer: Self-pay | Admitting: Nurse Practitioner

## 2021-02-09 NOTE — Progress Notes (Signed)
Cardiology Office Note   Date:  02/11/2021   ID:  Kimberly [name], Kimberly [name] [birth year], MRN [id number]  PCP:  [name], FNP  Cardiologist:   [name], MD   Chief Complaint  Patient presents with   Fatigue       History of Present Illness: Kimberly [name] is a 65 y.o. female who presents for follow up of CAD and CABG.  She had CVA with PCA subacute infarct in 2019.   She had SOB and I sent her for a perfusion study which demonstrated infarct but no ischemia.   She presented to the emergency department and was admitted on 08/19/2020 and was discharged on 08/22/2020.  On presentation she complained of right upper quadrant and right lower quadrant pain that was associated with intermittent periods of nausea and vomiting.  She denied diarrhea but reported constipation.  She was found to be in atrial fibrillation with RVR.  She converted to sinus rhythm 08/20/2020 and her Cardizem GTT was discontinued and she was started on Lopressor 25 mg daily.  She was also continued on her apixaban.  Her TSH was normal and her echocardiogram 08/19/2020 showed an LVEF of 65 to 70%.  Since I last saw her she was in the hospital with near syncope.  I reviewed these records this visit.  She had possible sepsis with urinary infection and dental caries as a source.  Her cultures were negative but she was treated with antibiotics.  She was very weak and had hypoxic respiratory failure due to poor mechanics and atelectasis.  She had atrial fibrillation with rapid rate.  They had suggested she go to a nursing home but she has been home with her family watching her most of the time and she does have an aide a couple of hours a day.  She is going to have nurses come to visit a couple of times.  She has PT apparently as well.  She has been very weak needing almost total assistance but can stand a little bit and can walk to bedside commode.  She is not having any new shortness of breath, PND or orthopnea.  She is not  having any new palpitations, presyncope or syncope.  She has had no chest pressure, neck or arm discomfort.  She does not notice her atrial fibrillation.  Past Medical History:  Diagnosis Date   Bicipital tenosynovitis    Coronary artery disease 05/2007   [id number]    Fall 02/13/2018   Family history of ischemic heart disease    Gout    Hammer toe    Hip, thigh, leg, and ankle, insect bite, nonvenomous, without mention of infection(916.4)    Hyperlipidemia    Hypertension    Other abnormality of red blood cells    Rotator cuff syndrome of left shoulder    Stroke [facility])     Past Surgical History:  Procedure Laterality Date   CARDIAC CATHETERIZATION     COLONOSCOPY WITH PROPOFOL N/A 01/30/2020   Procedure: COLONOSCOPY WITH PROPOFOL;  Surgeon: Carol Ada, MD;  Location: [facility];  Service: Endoscopy;  Laterality: N/A;   CORONARY ARTERY BYPASS GRAFT     triple   CORONARY ARTERY BYPASS GRAFT  2008   POLYPECTOMY  01/30/2020   Procedure: POLYPECTOMY;  Surgeon: Carol Ada, MD;  Location: [facility];  Service: Endoscopy;;   TUBAL LIGATION       Current Outpatient Medications  Medication Sig Dispense Refill   acetaminophen (TYLENOL) 325 MG tablet Take  2 tablets (650 mg total) by mouth every 6 (six) hours as needed for moderate pain, mild pain, fever or headache. 20 tablet 0   amLODipine (NORVASC) 10 MG tablet TAKE 1 TABLET BY MOUTH EVERY DAY 30 tablet 0   apixaban (ELIQUIS) 5 MG TABS tablet Take 1 tablet (5 mg total) by mouth 2 (two) times daily. 180 tablet 3   atorvastatin (LIPITOR) 80 MG tablet Take 1 tablet (80 mg total) by mouth daily. 90 tablet 1   carvedilol (COREG) 6.25 MG tablet TAKE 1 TABLET BY MOUTH 2 TIMES DAILY WITH A MEAL 60 tablet 0   docusate sodium (COLACE) 100 MG capsule Take 1 capsule (100 mg total) by mouth 2 (two) times daily as needed for mild constipation. 20 capsule 0   doxycycline (VIBRA-TABS) 100 MG tablet TAKE 1 TABLET BY MOUTH EVERY TWELVE HOURS 5  tablet 0   FEROSUL 325 (65 Fe) MG tablet TAKE 1 TABLET BY MOUTH EVERY DAY WITH BREAKFAST (Patient taking differently: Take 325 mg by mouth daily.) 30 tablet 8   furosemide (LASIX) 20 MG tablet Take 20 mg by mouth daily x 3 days 30 tablet 0   hydrALAZINE (APRESOLINE) 50 MG tablet Take 1 tablet (50 mg total) by mouth 3 (three) times daily. 270 tablet 3   hydrocortisone cream 1 % Apply 1 application topically daily as needed for itching.     mirtazapine (REMERON) 15 MG tablet TAKE 1 TABLET BY MOUTH AT BEDTIME 30 tablet 2   Multiple Vitamin (MULTIVITAMIN) capsule Take 1 capsule by mouth daily.     nicotine (NICODERM CQ - DOSED IN MG/24 HOURS) 21 mg/24hr patch Place 1 patch (21 mg total) onto the skin daily. 30 patch 3   pantoprazole (PROTONIX) 40 MG tablet TAKE 1 TABLET BY MOUTH EVERY DAY 30 tablet 0   predniSONE (DELTASONE) 5 MG tablet Label  & dispense according to the schedule below. take 8 Pills PO for 3 days, 6 Pills PO for 3 days, 4 Pills PO for 3 days, 2 Pills PO for 3 days, 1 Pills PO for 3 days, 1/2 Pill  PO for 3 days then STOP. Total 65 pills. 65 tablet 0   Tiotropium Bromide-Olodaterol (STIOLTO RESPIMAT) 2.5-2.5 MCG/ACT AERS Inhale 2 puffs into the lungs daily. 4 g 0   traMADol (ULTRAM) 50 MG tablet Take 1 tablet (50 mg total) by mouth every 6 (six) hours as needed. (Patient taking differently: Take 50 mg by mouth every 6 (six) hours as needed for moderate pain.) 20 tablet 0   No current facility-administered medications for this visit.    Allergies:   Clonidine derivatives and Spironolactone    ROS:  Please see the history of present illness.   Otherwise, review of systems are positive for none.   All other systems are reviewed and negative.    PHYSICAL EXAM: VS:  BP 99/69   Pulse (!) 58   Ht 5\' 7"  (1.702 m)   Wt 118 lb 12.8 oz (53.9 kg)   SpO2 100%   BMI 18.61 kg/m  , BMI Body mass index is 18.61 kg/m.  GEN:  No distress, mildly frail NECK:  No jugular venous distention at  90 degrees, waveform within normal limits, carotid upstroke brisk and symmetric, no bruits, no thyromegaly LYMPHATICS:  No cervical adenopathy LUNGS:  Clear to auscultation bilaterally BACK:  No CVA tenderness CHEST:  Unremarkable HEART:  S1 and S2 within normal limits, no K8,JG clicks, no rubs, no murmurs, irregular ABD:  Positive  bowel sounds normal in frequency in pitch, no bruits, no rebound, no guarding, unable to assess midline mass or bruit with the patient seated. EXT:  2 plus pulses throughout, moderate edema, no cyanosis no clubbing SKIN:  No rashes no nodules NEURO:  Cranial nerves II through XII grossly intact, motor grossly intact throughout PSYCH:  Cognitively intact, oriented to person place and time   EKG:  EKG is not ordered today.   Recent Labs: 08/19/2020: TSH 0.897 01/04/2021: ALT 63; BNP 585.7; BUN 23; Creatinine, Ser 0.87; Hemoglobin 12.6; Magnesium 2.1; Platelets 307; Potassium 4.4; Sodium 140    Lipid Panel    Component Value Date/Time   CHOL 114 09/02/2019 1651   TRIG 58 09/02/2019 1651   HDL 47 09/02/2019 1651   CHOLHDL 2.4 09/02/2019 1651   CHOLHDL 3.6 02/14/2018 0314   VLDL 16 02/14/2018 0314   LDLCALC 54 09/02/2019 1651      Wt Readings from Last 3 Encounters:  02/10/21 118 lb 12.8 oz (53.9 kg)  01/25/21 122 lb 6.4 oz (55.5 kg)  12/18/20 130 lb (59 kg)      Other studies Reviewed: Additional studies/ records that were reviewed today include: Hospital records reviewed Review of the above records demonstrates:  Please see elsewhere in the note.     ASSESSMENT AND PLAN:  CAD:   The patient has no new sypmtoms.  No further cardiovascular testing is indicated.  We will continue with aggressive risk reduction and meds as listed.  DYSPNEA:   Her breathing seems to be at baseline.  No change in therapy.  HTN: Her blood pressure is running low.  Reduce her hydralazine to 50 mg 3 times daily.  They will keep a blood pressure log and we may be able  to go down further on some of her medications.  She previously been hypertensive.  We did give her a blood pressure cuff to check this today.   CAROTID STENOSIS:   She has bilateral carotid artery occlusion and we are managing this medically.  PAF:  Kimberly [name] has a CHA2DS2 - VASc score of 4.  She tolerates anticoagulation and we have talked about monitoring her with a pulse oximeter.  No change in therapy.   TOBACCO ABUSE:    She is not smoking cigarettes.   Current medicines are reviewed at length with the patient today.  The patient does not have concerns regarding medicines.  The following changes have been made: As above  Labs/ tests ordered today include: None  No orders of the defined types were placed in this encounter.    Disposition:   FU with me in 2 - 3 months.  She can also see an APP.    Signed, [name], MD  02/11/2021 5:16 PM    [facility]  1

## 2021-02-10 ENCOUNTER — Other Ambulatory Visit: Payer: Self-pay

## 2021-02-10 ENCOUNTER — Ambulatory Visit (INDEPENDENT_AMBULATORY_CARE_PROVIDER_SITE_OTHER): Payer: Medicaid Other | Admitting: Cardiology

## 2021-02-10 ENCOUNTER — Encounter: Payer: Self-pay | Admitting: Cardiology

## 2021-02-10 VITALS — BP 99/69 | HR 58 | Ht 67.0 in | Wt 118.8 lb

## 2021-02-10 DIAGNOSIS — Z720 Tobacco use: Secondary | ICD-10-CM

## 2021-02-10 DIAGNOSIS — I10 Essential (primary) hypertension: Secondary | ICD-10-CM

## 2021-02-10 DIAGNOSIS — R0602 Shortness of breath: Secondary | ICD-10-CM

## 2021-02-10 DIAGNOSIS — E785 Hyperlipidemia, unspecified: Secondary | ICD-10-CM

## 2021-02-10 DIAGNOSIS — I480 Paroxysmal atrial fibrillation: Secondary | ICD-10-CM | POA: Diagnosis not present

## 2021-02-10 DIAGNOSIS — I2510 Atherosclerotic heart disease of native coronary artery without angina pectoris: Secondary | ICD-10-CM

## 2021-02-10 MED ORDER — HYDRALAZINE HCL 50 MG PO TABS: 50.0000 mg | ORAL_TABLET | Freq: Three times a day (TID) | ORAL | 3 refills | Status: DC

## 2021-02-10 NOTE — Patient Instructions (Signed)
Medication Instructions:   DECREASE HYDRALAZINE TO 50 MG THREE TIMES DAILY  *If you need a refill on your cardiac medications before your next appointment, please call your pharmacy*   Follow-Up: At [facility], you and your health needs are our priority.  As part of our continuing mission to provide you with exceptional heart care, we have created designated Provider Care Teams.  These Care Teams include your primary Cardiologist (physician) and Advanced Practice Providers (APPs -  Physician Assistants and Nurse Practitioners) who all work together to provide you with the care you need, when you need it.  We recommend signing up for the patient portal called "MyChart".  Sign up information is provided on this After Visit Summary.  MyChart is used to connect with patients for Virtual Visits (Telemedicine).  Patients are able to view lab/test results, encounter notes, upcoming appointments, etc.  Non-urgent messages can be sent to your provider as well.   To learn more about what you can do with MyChart, go to [email or web address].    Your next appointment:   2 month(s)  The format for your next appointment:   In Person  Provider:   [name], MD

## 2021-02-11 ENCOUNTER — Encounter: Payer: Self-pay | Admitting: Cardiology

## 2021-02-15 NOTE — Patient Instructions (Signed)
Visit Information It was great speaking with you today!  Please let me know if you have any questions about our visit.   Goals Addressed             This Visit's Progress    Manage My Medicine       Timeframe:  Long-Range Goal Priority:  High Start Date:                             Expected End Date:                       Follow Up Date 05/25/2021   - call for medicine refill 2 or 3 days before it runs out - call if I am sick and can't take my medicine - keep a list of all the medicines I take; vitamins and herbals too - use a pillbox to sort medicine - use an alarm clock or phone to remind me to take my medicine    Why is this important?   These steps will help you keep on track with your medicines.   Notes:            Current Barriers:  Unable to independently monitor therapeutic efficacy  Pharmacist Clinical Goal(s):  Patient will achieve adherence to monitoring guidelines and medication adherence to achieve therapeutic efficacy through collaboration with PharmD and provider.   Interventions: 1:1 collaboration with [name], FNP regarding development and update of comprehensive plan of care as evidenced by provider attestation and co-signature Inter-disciplinary care team collaboration (see longitudinal plan of care) Comprehensive medication review performed; medication list updated in electronic medical record  Hypertension (BP goal <130/80) -Controlled -Current treatment: Amlodipine 10 mg taking 1 tablet by mouth daily Hydralazine 50 mg taking 1 tablet by mouth three times daily Carvedilol 6.25 mg taking 1 tablet by mouth 2 times daily  -Current home readings: none noted  -Current dietary habits: patients sister reports they are trying to monitor her BP more closely -Current exercise habits: at this time the patient is not exercising  -Denies hypotensive/hypertensive symptoms -Educated on Importance of home blood pressure monitoring; Proper BP  monitoring technique; -Counseled to monitor BP at home three times per week, document, and provide log at future appointments -Recommended to continue current medication  Coronary Artery Disease/Hyperlipidemia: (LDL goal < 70) -Controlled -Current treatment: Atorvastatin 80 mg tablet once per day Furosemide 20 mg tablet once per day Eliquis 5 mg tablet twice per day Carvedilol 6.25 mg tablet twice per day Amlodipine 10 mg tablet daily Hydralazine 50 mg tablet three times daily -Current dietary patterns: patients sister reports that they are trying to monitor her eating habits -Current exercise habits: None at this time  -Educated on Cholesterol goals;  Importance of limiting foods high in cholesterol; -Recommended to continue current medication  Patient Goals/Self-Care Activities Patient will:  - take medications as prescribed  Follow Up Plan: The patient has been provided with contact information for the care management team and has been advised to call with any health related questions or concerns.      Patient agreed to services and verbal consent obtained.   Print copy of patient instructions, educational materials, and care plan provided in person.  [name], PharmD Clinical Pharmacist [facility] [phone]

## 2021-02-18 ENCOUNTER — Telehealth: Payer: Medicaid Other

## 2021-02-22 ENCOUNTER — Ambulatory Visit: Payer: Medicaid Other

## 2021-02-22 DIAGNOSIS — R634 Abnormal weight loss: Secondary | ICD-10-CM

## 2021-02-22 DIAGNOSIS — F32A Depression, unspecified: Secondary | ICD-10-CM

## 2021-02-22 DIAGNOSIS — I10 Essential (primary) hypertension: Secondary | ICD-10-CM

## 2021-02-22 NOTE — Patient Instructions (Signed)
Visit Information   Goals Addressed             This Visit's Progress    COMPLETED: Work with SW to address barriers to treatment       Timeframe:  Long-Range Goal Priority:  Low Start Date:   1.14.22                          Expected End Date: 5.14.22                        Patient Goals/Self-Care Activities patient will: With the help of her sister  - Follow up with DSS to determine coverage related to a personal emergency response system - Contact SW as needed         The patient verbalized understanding of instructions, educational materials, and care plan provided today and declined offer to receive copy of patient instructions, educational materials, and care plan.   Please contact me as needed  [name], BSW, CDP Social Worker, Certified Dementia Practitioner [place] [facility] [place] Management [phone]

## 2021-02-22 NOTE — Chronic Care Management (AMB) (Signed)
Social Work Note  02/22/2021 Name: Kimberly Kimberly [name] MRN: [id number] DOB: [birth year]  Kimberly Kimberly [name] is a 65 y.o. year old female who is a primary care Kimberly [name] of Kimberly Kimberly [name], Kimberly Kimberly [name].  Kimberly Care Management team was consulted for assistance with chronic disease management and care coordination needs.  Kimberly Kimberly [name] was given information about Care Management services today including:  Care Management services include personalized support from designated clinical staff supervised by her physician, including individualized plan of care and coordination with other care providers 24/7 contact phone numbers for assistance for urgent and routine care needs. Kimberly Kimberly [name] may stop care management services at any time (effective at Kimberly end of Kimberly month) by phone call to Kimberly office staff.  Kimberly [name] agreed to services and consent obtained.   Engaged with Kimberly [name] sister by phone  for follow up visit in response to provider referral for social work chronic care management and care coordination services.  Assessment: Review of Kimberly [name] past medical history, allergies, medications, and health status, including review of pertinent consultant reports was performed as part of comprehensive evaluation and provision of care management/care coordination services.   SDOH (Social Determinants of Health) assessments and interventions performed:  No    Advanced Directives Status: Not addressed in this encounter.  Care Plan  Allergies  Allergen Reactions   Clonidine Derivatives Other (See Comments)    Patch causes scars   Spironolactone Rash    Outpatient Encounter Medications as of 02/22/2021  Medication Sig   acetaminophen (TYLENOL) 325 MG tablet Take 2 tablets (650 mg total) by mouth every 6 (six) hours as needed for moderate pain, mild pain, fever or headache.   amLODipine (NORVASC) 10 MG tablet TAKE 1 TABLET BY MOUTH EVERY DAY   apixaban (ELIQUIS) 5 MG TABS tablet Take 1 tablet (5 mg total) by mouth 2 (two)  times daily.   atorvastatin (LIPITOR) 80 MG tablet Take 1 tablet (80 mg total) by mouth daily.   carvedilol (COREG) 6.25 MG tablet TAKE 1 TABLET BY MOUTH 2 TIMES DAILY WITH A MEAL   docusate sodium (COLACE) 100 MG capsule Take 1 capsule (100 mg total) by mouth 2 (two) times daily as needed for mild constipation.   doxycycline (VIBRA-TABS) 100 MG tablet TAKE 1 TABLET BY MOUTH EVERY TWELVE HOURS   FEROSUL 325 (65 Fe) MG tablet TAKE 1 TABLET BY MOUTH EVERY DAY WITH BREAKFAST (Kimberly [name] taking differently: Take 325 mg by mouth daily.)   furosemide (LASIX) 20 MG tablet Take 20 mg by mouth daily x 3 days   hydrALAZINE (APRESOLINE) 50 MG tablet Take 1 tablet (50 mg total) by mouth 3 (three) times daily.   hydrocortisone cream 1 % Apply 1 application topically daily as needed for itching.   mirtazapine (REMERON) 15 MG tablet TAKE 1 TABLET BY MOUTH AT BEDTIME   Multiple Vitamin (MULTIVITAMIN) capsule Take 1 capsule by mouth daily.   nicotine (NICODERM CQ - DOSED IN MG/24 HOURS) 21 mg/24hr patch Place 1 patch (21 mg total) onto Kimberly skin daily.   pantoprazole (PROTONIX) 40 MG tablet TAKE 1 TABLET BY MOUTH EVERY DAY   predniSONE (DELTASONE) 5 MG tablet Label  & dispense according to Kimberly schedule below. take 8 Pills PO for 3 days, 6 Pills PO for 3 days, 4 Pills PO for 3 days, 2 Pills PO for 3 days, 1 Pills PO for 3 days, 1/2 Pill  PO for 3 days then STOP. Total 65 pills.   Tiotropium Bromide-Olodaterol (STIOLTO RESPIMAT) 2.5-2.5 MCG/ACT  AERS Inhale 2 puffs into Kimberly lungs daily.   traMADol (ULTRAM) 50 MG tablet Take 1 tablet (50 mg total) by mouth every 6 (six) hours as needed. (Kimberly [name] taking differently: Take 50 mg by mouth every 6 (six) hours as needed for moderate pain.)   No facility-administered encounter medications on file as of 02/22/2021.    Kimberly [name] Active Problem List   Diagnosis Date Noted   Pulmonary nodule 01/25/2021   Shortness of breath 01/25/2021   Sepsis ([place]) 12/18/2020   Family history of  malignant neoplasm of gastrointestinal tract 12/07/2020   Personal history of colonic polyps 12/07/2020   Secondary hypercoagulable state ([place]) 10/14/2020   Right sided abdominal pain    Abdominal wall pain    Atrial fibrillation ([place]) 08/19/2020   Educated about COVID-19 virus infection 11/16/2019   Urinary frequency 04/14/2019   Cough 04/14/2019   AKI (acute kidney injury) ([place]) 11/26/2018   Diarrhea 11/25/2018   Dehydration, mild 11/25/2018   Hemiparesis affecting nondominant 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 ([place]) 07/31/2018   Muscle ache 07/31/2018   Prediabetes 07/31/2018   Chronic gout without tophus 07/31/2018   Vision loss 07/31/2018   Malnutrition of moderate degree 02/15/2018   Carotid occlusion, bilateral    Atrial fibrillation with RVR ([place]) 02/13/2018   Positive D dimer 02/13/2018   Back pain 02/13/2018   CVA (cerebral vascular accident) ([place]) 02/13/2018   Paroxysmal atrial fibrillation ([place]) 02/13/2018   Coronary artery disease involving native coronary artery of native heart without angina pectoris 02/13/2018   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/2018   Aortic atherosclerosis ([place]) 02/13/2018   CAD, ARTERY BYPASS GRAFT 05/03/2009   TOBACCO USE, QUIT 05/03/2009   ROTATOR CUFF SYNDROME, LEFT 02/12/2009   Shoulder pain, left 11/03/2008   HAMMER TOE, ACQUIRED 02/11/2008   INSECT BITE, LEG 02/11/2008   Cerebral artery occlusion with cerebral infarction ([place]) 12/05/2007   Dyslipidemia 11/05/2007   UNSPECIFIED DISORDER TEETH&SUPPORTING STRUCTURES 11/05/2007   BICEPS TENDINITIS, RIGHT 04/24/2007   Essential hypertension 04/23/2007   MICROCYTOSIS 04/23/2007    Conditions to be addressed/monitored: HTN, Depression, and Abnormal Weight loss  Care Plan : Social Work [facility] Plan of Care  Updates made by [name] since 02/22/2021 12:00 AM  Completed 02/22/2021   Problem: Barriers to  Treatment Resolved 02/22/2021     Long-Range Goal: Barriers to Treatment Identified and Managed Completed 02/22/2021  Start Date: 08/27/2020  Expected End Date: 12/25/2020  Recent Progress: On track  Priority: Low  Note:   Current Barriers:  Limited social support Level of care concerns ADL IADL limitations Limited access to caregiver Abnormal weight loss Chronic conditions including HTN and Depression which put Kimberly [name] at increased risk of hospitalization  Social Work Clinical Goal(s):  Over Kimberly next 120 days Kimberly Kimberly [name] will work with SW to address care coordination needs  Over Kimberly next 60 days Kimberly Kimberly [name] and her sister will work with SW to identify resources to obtain a personal emergency response system  Interventions: 1:1 collaboration with Kimberly Brine, FNP regarding development and update of comprehensive plan of care as evidenced by provider attestation and co-signature Inter-disciplinary care team collaboration (see longitudinal plan of care) Successful outbound call placed to Kimberly patients sister, Kimberly Kimberly [name], to assess goal progression Confirmed receipt of mailed [place] attempted to go to DSS to determine if Kimberly patients Medicaid would cover a life alert but they would not speak to her without Kimberly  Kimberly [name] present Kimberly Kimberly [name] reports she has left a message with Kimberly patients case worker requesting an appointment time No other acute SW needs identified at this time Discussed plans for SW to close goal with option to work with Kimberly [name] in Kimberly future as needed  Kimberly [name] Goals/Self-Care Activities Kimberly [name] will: With Kimberly help of her sister  -  Follow up with DSS to determine coverage related to a personal emergency response system - Contact SW as needed        Follow Up Plan:  No SW follow up planned at this time. Kimberly Kimberly [name] will remain engaged with RN Care Manager      [name], BSW, CDP Social Worker, Certified Dementia Practitioner [place] / [place]  Management [phone]

## 2021-03-09 ENCOUNTER — Ambulatory Visit (INDEPENDENT_AMBULATORY_CARE_PROVIDER_SITE_OTHER): Payer: Medicaid Other | Admitting: Podiatry

## 2021-03-09 ENCOUNTER — Telehealth: Payer: Self-pay

## 2021-03-09 DIAGNOSIS — M79675 Pain in left toe(s): Secondary | ICD-10-CM

## 2021-03-09 DIAGNOSIS — B351 Tinea unguium: Secondary | ICD-10-CM

## 2021-03-09 DIAGNOSIS — M79674 Pain in right toe(s): Secondary | ICD-10-CM

## 2021-03-09 NOTE — Chronic Care Management (AMB) (Signed)
Chronic Care Management Pharmacy Assistant   Name: DAVEENA KRELLER  MRN: [id number] DOB: [birth year]   Reason for Encounter: Disease State/ General  Recent office visits:  02-22-2021 [name] (CCM)  Recent consult visits:  02-10-2021 [name], MD (Cardiology). Decrease hydralazine to 50 mg three times daily.  Hospital visits:  Medication Reconciliation was completed by comparing discharge summary, patient's EMR and Pharmacy list, and upon discussion with patient.  Admitted to the hospital on 12-18-2020 due to a fall.  Discharge date was 12-25-2020. Discharged from [place]?Medications Started at [facility] Discharge:?? Tylenol 325 MG tablet every 6 hours as needed Carvedilol 6.25 mg twice daily Colace 100 mg twice daily as needed Doxycycline 100 mg every 12 hours Hydralazine 100 mg every 8 hours Pantoprazole 40 mg daily  Medication Changes at Hospital Discharge: None  Medications Discontinued at Hospital Discharge: Aspirin 81 mg Metoprolol 50 mg Omeprazole   Medications that remain the same after Hospital Discharge:??  -All other medications will remain the same.    Medications: Outpatient Encounter Medications as of 03/09/2021  Medication Sig   acetaminophen (TYLENOL) 325 MG tablet Take 2 tablets (650 mg total) by mouth every 6 (six) hours as needed for moderate pain, mild pain, fever or headache.   amLODipine (NORVASC) 10 MG tablet TAKE 1 TABLET BY MOUTH EVERY DAY   apixaban (ELIQUIS) 5 MG TABS tablet Take 1 tablet (5 mg total) by mouth 2 (two) times daily.   atorvastatin (LIPITOR) 80 MG tablet Take 1 tablet (80 mg total) by mouth daily.   carvedilol (COREG) 6.25 MG tablet TAKE 1 TABLET BY MOUTH 2 TIMES DAILY WITH A MEAL   docusate sodium (COLACE) 100 MG capsule Take 1 capsule (100 mg total) by mouth 2 (two) times daily as needed for mild constipation.   doxycycline (VIBRA-TABS) 100 MG tablet TAKE 1 TABLET BY MOUTH EVERY TWELVE HOURS    FEROSUL 325 (65 Fe) MG tablet TAKE 1 TABLET BY MOUTH EVERY DAY WITH BREAKFAST (Patient taking differently: Take 325 mg by mouth daily.)   furosemide (LASIX) 20 MG tablet Take 20 mg by mouth daily x 3 days   hydrALAZINE (APRESOLINE) 50 MG tablet Take 1 tablet (50 mg total) by mouth 3 (three) times daily.   hydrocortisone cream 1 % Apply 1 application topically daily as needed for itching.   mirtazapine (REMERON) 15 MG tablet TAKE 1 TABLET BY MOUTH AT BEDTIME   Multiple Vitamin (MULTIVITAMIN) capsule Take 1 capsule by mouth daily.   nicotine (NICODERM CQ - DOSED IN MG/24 HOURS) 21 mg/24hr patch Place 1 patch (21 mg total) onto the skin daily.   pantoprazole (PROTONIX) 40 MG tablet TAKE 1 TABLET BY MOUTH EVERY DAY   predniSONE (DELTASONE) 5 MG tablet Label  & dispense according to the schedule below. take 8 Pills PO for 3 days, 6 Pills PO for 3 days, 4 Pills PO for 3 days, 2 Pills PO for 3 days, 1 Pills PO for 3 days, 1/2 Pill  PO for 3 days then STOP. Total 65 pills.   Tiotropium Bromide-Olodaterol (STIOLTO RESPIMAT) 2.5-2.5 MCG/ACT AERS Inhale 2 puffs into the lungs daily.   traMADol (ULTRAM) 50 MG tablet Take 1 tablet (50 mg total) by mouth every 6 (six) hours as needed. (Patient taking differently: Take 50 mg by mouth every 6 (six) hours as needed for moderate pain.)   No facility-administered encounter medications on file as of 03/09/2021.   Reviewed chart prior to disease  state call. Spoke with patient regarding BP  Recent Office Vitals: BP Readings from Last 3 Encounters:  02/10/21 99/69  01/25/21 112/60  01/04/21 114/80   Pulse Readings from Last 3 Encounters:  02/10/21 (!) 58  01/25/21 70  01/04/21 76    Wt Readings from Last 3 Encounters:  02/10/21 118 lb 12.8 oz (53.9 kg)  01/25/21 122 lb 6.4 oz (55.5 kg)  12/18/20 130 lb (59 kg)     Kidney Function Lab Results  Component Value Date/Time   CREATININE 0.87 01/04/2021 04:38 PM   CREATININE 1.09 (H) 12/21/2020 02:51 AM    CREATININE 0.98 05/04/2014 10:47 AM   GFRNONAA 57 (L) 12/21/2020 02:51 AM   GFRAA >60 04/27/2020 02:27 PM    BMP Latest Ref Rng & Units 01/04/2021 12/21/2020 12/20/2020  Glucose 65 - 99 mg/dL 99 141(H) 96  BUN 8 - 27 mg/dL '23 17 12  '$ Creatinine 0.57 - 1.00 mg/dL 0.87 1.09(H) 0.80  BUN/Creat Ratio 12 - 28 26 - -  Sodium 134 - 144 mmol/L 140 135 139  Potassium 3.5 - 5.2 mmol/L 4.4 3.8 3.7  Chloride 96 - 106 mmol/L 103 108 112(H)  CO2 20 - 29 mmol/L 18(L) 21(L) 22  Calcium 8.7 - 10.3 mg/dL 9.4 8.5(L) 8.7(L)    Current antihypertensive regimen:  Amlodipine 10 mg daily  How often are you checking your Blood Pressure? 3-5x per week  Current home BP readings: 130/80  What recent interventions/DTPs have been made by any provider to improve Blood Pressure control since last CPP Visit:  Educated on Importance of home blood pressure monitoring; Proper BP monitoring technique Counseled to monitor BP at home three times per week, document, and provide log at future appointments  Any recent hospitalizations or ED visits since last visit with CPP? No  What diet changes have been made to improve Blood Pressure Control?  Patient's sister states since she doesn't have a big appetite so they allow her to eat whatever she wants when hungry.  What exercise is being done to improve your Blood Pressure Control?  Patient's sister states patient isn't really active. Patient's insurance didn't cover home physical therapy. Patient's sister states the family is working on taking her to a facility 3 days out the week for therapy.  Adherence Review: Is the patient currently on ACE/ARB medication? No Does the patient have >5 day gap between last estimated fill dates? No  03/09/2021 Name: MARISELA SHARBAUGH MRN: [id number] DOB: 11-10-1955 Heloise Beecham Mcguiness is a 65 y.o. year old female who is a primary care patient of [name], [place].  Comprehensive medication review performed; Spoke to patient regarding  cholesterol  Lipid Panel    Component Value Date/Time   CHOL 114 09/02/2019 1651   TRIG 58 09/02/2019 1651   HDL 47 09/02/2019 1651   LDLCALC 54 09/02/2019 1651    10-year ASCVD risk score: The ASCVD Risk score Mikey Bussing DC Jr., et al., 2013) failed to calculate for the following reasons:   The patient has a prior MI or stroke diagnosis  Current antihyperlipidemic regimen:  Atorvastatin 80 mg daily  Previous antihyperlipidemic medications tried: None  ASCVD risk enhancing conditions: HTN  What recent interventions/DTPs have been made by any provider to improve Cholesterol control since last CPP Visit:  Educated on Cholesterol goals Importance of limiting foods high in cholesterol  Any recent hospitalizations or ED visits since last visit with CPP? No  What diet changes have been made to improve Cholesterol?  Patient's sister  states since she doesn't have a big appetite so they allow her to eat whatever she wants when hungry.  What exercise is being done to improve Cholesterol?  Patient's sister states patient isn't really active. Patient's insurance didn't cover home physical therapy. Patient's sister states the family is working on taking her to a facility 3 days out the week for therapy.  Adherence Review: Does the patient have >5 day gap between last estimated fill dates? No  NOTES: Patient's sister states patient is doing better with her depression. Patient is more outgoing and is laughing a lot.   Care Gaps: Shingrix overdue Covid booster overdue  Star Rating Drugs: Atorvastatin 80 mg- Last filled 02-12-2021 30 DS [facility] Clinical Pharmacist Assistant [phone]

## 2021-03-09 NOTE — Progress Notes (Deleted)
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  Given the thrombosis of right PCA and bilateral carotid artery occlusions, blood pressure goal should be 130-160 to ensure adequate perfusion of collaterals. 2  Gabapentin '100mg'$  up to three times daily if needed  Subjective:  Kimberly [name] is a 65 year old right-handed black woman with hypertension, hyperlipidemia, paroxysmal atrial fibrillation, coronary artery disease and prior history of strokes who follows up for stroke.   UPDATE: Current medications:  Eliquis; Lipitor '80mg'$ ; ASA '81mg'$ ; gabapentin '100mg'$  daily   For neuropathic pain, she was started on gabapentin.  It helps a little bit.  Some days are good and some days are bad (such as when    Carotid ultrasound on 03/23/2020 redemonstrated occlused bilateral ICAs, antegrade flow in bilateral vertebral arteries.   HISTORY:  Kimberly [name] was admitted to [facility] on 02/13/18 for stroke presenting as dizziness, fall and left hemianopia.  She was previously on aspirin and Plavix.  She was not given tPA due to outside therapeutic window.  She was found to have new atrial fibrillation.  CT of head revealed right acute to subacute PCA infarct.  MRI of brain confirmed large acute right PCA territory infarct.  CTA of head and neck showed bilateral ICA occlusions at origin, right PCA occlusion and basilar artery irregularity but bilateral MCAs patent from collaterals.  Carotid doppler showed bilateral ICA  occlusion with possible left ICA fresh clot.  Transcranial doppler showed left ophthalmic artery and right ACA flow reversal consistent with bilateral carotid system occlusion.  2D echo showed EF 60-65%.  LDL was 100.  Hgb A1c was 5.8.  She remained on ASA and Plavix.  Due to large size of stroke, initiation of anticoagulation was postponed until 5 to 7 days post stroke to avoid hemorrhagic conversion.  She was subsequently started on Eliquis.  Plavix subsequently discontinued.  She was started on Lipitor '80mg'$ .     She has residual left sided weakness and pain.  She uses a walker.  She uses a stress ball to strengthen her hand.  Medicaid only approves 3 sessions a year of physical therapy. She smokes cigarettes (about 1 pack per week)  PAST MEDICAL HISTORY: Past Medical History:  Diagnosis Date   Bicipital tenosynovitis    Coronary artery disease 05/2007   [id number]    Fall 02/13/2018   Family history of ischemic heart disease    Gout    Hammer toe    Hip, thigh, leg, and ankle, insect bite, nonvenomous, without mention of infection(916.4)    Hyperlipidemia    Hypertension    Other abnormality of red blood cells    Rotator cuff syndrome of left shoulder    Stroke [facility])     MEDICATIONS: Current Outpatient Medications on File Prior to Visit  Medication Sig Dispense Refill   acetaminophen (TYLENOL) 325 MG tablet Take  2 tablets (650 mg total) by mouth every 6 (six) hours as needed for moderate pain, mild pain, fever or headache. 20 tablet 0   amLODipine (NORVASC) 10 MG tablet TAKE 1 TABLET BY MOUTH EVERY DAY 30 tablet 0   apixaban (ELIQUIS) 5 MG TABS tablet Take 1 tablet (5 mg total) by mouth 2 (two) times daily. 180 tablet 3   atorvastatin (LIPITOR) 80 MG tablet Take 1 tablet (80 mg total) by mouth daily. 90 tablet 1   carvedilol (COREG) 6.25 MG tablet TAKE 1 TABLET BY MOUTH 2 TIMES DAILY WITH A MEAL 60 tablet 0   docusate sodium (COLACE) 100 MG capsule Take 1 capsule (100 mg total) by mouth  2 (two) times daily as needed for mild constipation. 20 capsule 0   doxycycline (VIBRA-TABS) 100 MG tablet TAKE 1 TABLET BY MOUTH EVERY TWELVE HOURS 5 tablet 0   FEROSUL 325 (65 Fe) MG tablet TAKE 1 TABLET BY MOUTH EVERY DAY WITH BREAKFAST (Patient taking differently: Take 325 mg by mouth daily.) 30 tablet 8   furosemide (LASIX) 20 MG tablet Take 20 mg by mouth daily x 3 days 30 tablet 0   hydrALAZINE (APRESOLINE) 50 MG tablet Take 1 tablet (50 mg total) by mouth 3 (three) times daily. 270 tablet 3   hydrocortisone cream 1 % Apply 1 application topically daily as needed for itching.     mirtazapine (REMERON) 15 MG tablet TAKE 1 TABLET BY MOUTH AT BEDTIME 30 tablet 2   Multiple Vitamin (MULTIVITAMIN) capsule Take 1 capsule by mouth daily.     nicotine (NICODERM CQ - DOSED IN MG/24 HOURS) 21 mg/24hr patch Place 1 patch (21 mg total) onto the skin daily. 30 patch 3   pantoprazole (PROTONIX) 40 MG tablet TAKE 1 TABLET BY MOUTH EVERY DAY 30 tablet 0   predniSONE (DELTASONE) 5 MG tablet Label  & dispense according to the schedule below. take 8 Pills PO for 3 days, 6 Pills PO for 3 days, 4 Pills PO for 3 days, 2 Pills PO for 3 days, 1 Pills PO for 3 days, 1/2 Pill  PO for 3 days then STOP. Total 65 pills. 65 tablet 0   Tiotropium Bromide-Olodaterol (STIOLTO RESPIMAT) 2.5-2.5 MCG/ACT AERS Inhale 2 puffs into the lungs daily. 4 g 0   traMADol (ULTRAM) 50 MG tablet Take 1 tablet (50 mg total) by mouth every 6 (six) hours as needed. (Patient taking differently: Take 50 mg by mouth every 6 (six) hours as needed for moderate pain.) 20 tablet 0   No current facility-administered medications on file prior to visit.    ALLERGIES: Allergies  Allergen Reactions   Clonidine Derivatives Other (See Comments)    Patch causes scars   Spironolactone Rash    FAMILY HISTORY: Family History  Problem Relation Age of Onset   Hypertension Mother    Heart attack Father    Diabetes Father    Kidney failure Father     Anxiety disorder Sister    Sarcoidosis Sister       Objective:  *** General: No acute distress.  Patient appears well-groomed.   Head:  Normocephalic/atraumatic Eyes:  Fundi examined but not visualized Neck: supple, no paraspinal tenderness, full range of motion Heart:  Regular rate and rhythm Lungs:  Clear to auscultation bilaterally Back: No paraspinal tenderness Neurological Exam: alert and oriented to person, place, and time. Attention span and concentration intact, recent and remote memory intact, fund of knowledge intact.  Speech fluent and not dysarthric,  language intact.  Left homonymous hemianopsia.  Otherwise, CN II-XII intact. Bulk and tone normal, muscle strength:  4+/5 left upper extremity except 3+/5 left hand grip; 4+/5 left lower extremity,, 5/5 right upper, 5-/5 right lower extremities.  Decreased pinprick andvibratory sensation on left, intact on right. Deep tendon reflexes 3+ on left, 2+ on right, toes downgoing.  Finger to nose testing intact.  Uses upper strength to stand.  Significant difficulty maintaining balance to stand.  Hemiplegic gait.  Unsteady.  Requires assistance to ambulate.  Metta Clines, DO  CC: [name], FNP

## 2021-03-10 ENCOUNTER — Ambulatory Visit: Payer: Medicaid Other | Admitting: Neurology

## 2021-03-11 ENCOUNTER — Ambulatory Visit: Payer: Medicaid Other | Admitting: Neurology

## 2021-03-12 NOTE — Progress Notes (Signed)
   Complete physical exam  Patient: Kimberly [name]   DOB: 06/03/1999   65 y.o. Female  MRN: [id number]  Subjective:    No chief complaint on file.   Kimberly [name] is a 65 y.o. female who presents today for a complete physical exam. She reports consuming a {diet types:17450} diet. {types:19826} She generally feels {DESC; WELL/FAIRLY WELL/POORLY:18703}. She reports sleeping {DESC; WELL/FAIRLY WELL/POORLY:18703}. She {does/does not:200015} have additional problems to discuss today.    Most recent fall risk assessment:    02/08/2022   10:42 AM  Fall Risk   Falls in the past year? 0  Number falls in past yr: 0  Injury with Fall? 0  Risk for fall due to : No Fall Risks  Follow up Falls evaluation completed     Most recent depression screenings:    02/08/2022   10:42 AM 12/30/2020   10:46 AM  PHQ 2/9 Scores  PHQ - 2 Score 0 0  PHQ- 9 Score 5     {VISON DENTAL STD PSA (Optional):27386}  {History (Optional):23778}  Patient Care Team: Jessup, Joy, NP as PCP - General (Nurse Practitioner)   Outpatient Medications Prior to Visit  Medication Sig   fluticasone (FLONASE) 50 MCG/ACT nasal spray Place 2 sprays into both nostrils in the morning and at bedtime. After 7 days, reduce to once daily.   norgestimate-ethinyl estradiol (SPRINTEC 28) 0.25-35 MG-MCG tablet Take 1 tablet by mouth daily.   Nystatin POWD Apply liberally to affected area 2 times per day   spironolactone (ALDACTONE) 100 MG tablet Take 1 tablet (100 mg total) by mouth daily.   No facility-administered medications prior to visit.    ROS        Objective:     There were no vitals taken for this visit. {Vitals History (Optional):23777}  Physical Exam   No results found for any visits on 03/16/22. {Show previous labs (optional):23779}    Assessment & Plan:    Routine Health Maintenance and Physical Exam  Immunization History  Administered Date(s) Administered   DTaP 08/17/1999, 10/13/1999,  12/22/1999, 09/06/2000, 03/22/2004   Hepatitis A 01/17/2008, 01/22/2009   Hepatitis B 06/04/1999, 07/12/1999, 12/22/1999   HiB (PRP-OMP) 08/17/1999, 10/13/1999, 12/22/1999, 09/06/2000   IPV 08/17/1999, 10/13/1999, 06/11/2000, 03/22/2004   Influenza,inj,Quad PF,6+ Mos 04/24/2014   Influenza-Unspecified 07/24/2012   MMR 06/11/2001, 03/22/2004   Meningococcal Polysaccharide 01/22/2012   Pneumococcal Conjugate-13 09/06/2000   Pneumococcal-Unspecified 12/22/1999, 03/06/2000   Tdap 01/22/2012   Varicella 06/11/2000, 01/17/2008    Health Maintenance  Topic Date Due   HIV Screening  Never done   Hepatitis C Screening  Never done   INFLUENZA VACCINE  03/14/2022   PAP-Cervical Cytology Screening  03/16/2022 (Originally 06/02/2020)   PAP SMEAR-Modifier  03/16/2022 (Originally 06/02/2020)   TETANUS/TDAP  03/16/2022 (Originally 01/21/2022)   HPV VACCINES  Discontinued   COVID-19 Vaccine  Discontinued    Discussed health benefits of physical activity, and encouraged her to engage in regular exercise appropriate for her age and condition.  Problem List Items Addressed This Visit   None Visit Diagnoses     Annual physical exam    -  Primary   Cervical cancer screening       Need for Tdap vaccination          No follow-ups on file.     Joy Jessup, NP   

## 2021-03-14 ENCOUNTER — Other Ambulatory Visit: Payer: Self-pay | Admitting: Nurse Practitioner

## 2021-03-16 ENCOUNTER — Encounter: Payer: Self-pay | Admitting: Nurse Practitioner

## 2021-03-16 ENCOUNTER — Other Ambulatory Visit: Payer: Self-pay

## 2021-03-16 ENCOUNTER — Ambulatory Visit (INDEPENDENT_AMBULATORY_CARE_PROVIDER_SITE_OTHER): Payer: Medicaid Other | Admitting: Nurse Practitioner

## 2021-03-16 VITALS — BP 102/58 | HR 89 | Temp 98.3°F

## 2021-03-16 DIAGNOSIS — I69359 Hemiplegia and hemiparesis following cerebral infarction affecting unspecified side: Secondary | ICD-10-CM

## 2021-03-16 DIAGNOSIS — R531 Weakness: Secondary | ICD-10-CM

## 2021-03-16 DIAGNOSIS — I10 Essential (primary) hypertension: Secondary | ICD-10-CM | POA: Diagnosis not present

## 2021-03-16 DIAGNOSIS — R790 Abnormal level of blood mineral: Secondary | ICD-10-CM

## 2021-03-16 DIAGNOSIS — Z23 Encounter for immunization: Secondary | ICD-10-CM | POA: Diagnosis not present

## 2021-03-16 DIAGNOSIS — Z720 Tobacco use: Secondary | ICD-10-CM

## 2021-03-16 MED ORDER — NICOTINE 21 MG/24HR TD PT24: 21.0000 mg | MEDICATED_PATCH | Freq: Every day | TRANSDERMAL | 3 refills | Status: DC

## 2021-03-16 NOTE — Progress Notes (Signed)
I,Tianna Badgett,acting as a Education administrator for [employer], FNP.,have documented all relevant documentation on the behalf of [name], FNP,as directed by  [name], FNP while in the presence of [name], [place].  This visit occurred during the SARS-CoV-2 public health emergency.  Safety protocols were in place, including screening questions prior to the visit, additional usage of staff PPE, and extensive cleaning of exam room while observing appropriate contact time as indicated for disinfecting solutions.  Subjective:     Patient ID: Kimberly [name] , female    DOB: 01-May-1956 , 65 y.o.   MRN: [id number]   Chief Complaint  Patient presents with   Hypertension    HPI  Patient is here for HTN. She has concerns with general weakness.  She quit smoking in May.  She is eating more healthy. Her family has not bought her any cigarettes in about 2 months     Hypertension This is a chronic problem. The current episode started more than 1 year ago. The problem is controlled. Pertinent negatives include no anxiety, chest pain, headaches, malaise/fatigue, palpitations or shortness of breath. Risk factors for coronary artery disease include smoking/tobacco exposure. Past treatments include calcium channel blockers. There are no compliance problems.  Hypertensive end-organ damage includes CVA. There is no history of angina or kidney disease. There is no history of chronic renal disease.    Past Medical History:  Diagnosis Date   Bicipital tenosynovitis    Coronary artery disease 05/2007   [id number]    Fall 02/13/2018   Family history of ischemic heart disease    Gout    Hammer toe    Hip, thigh, leg, and ankle, insect bite, nonvenomous, without mention of infection(916.4)    Hyperlipidemia    Hypertension    Other abnormality of red blood cells    Rotator cuff syndrome of left shoulder    Stroke ([place])      Family History  Problem Relation Age of Onset   Hypertension Mother    Heart  attack Father    Diabetes Father    Kidney failure Father    Anxiety disorder Sister    Sarcoidosis Sister      Current Outpatient Medications:    acetaminophen (TYLENOL) 325 MG tablet, Take 2 tablets (650 mg total) by mouth every 6 (six) hours as needed for moderate pain, mild pain, fever or headache., Disp: 20 tablet, Rfl: 0   amLODipine (NORVASC) 10 MG tablet, TAKE 1 TABLET BY MOUTH EVERY DAY, Disp: 30 tablet, Rfl: 0   apixaban (ELIQUIS) 5 MG TABS tablet, Take 1 tablet (5 mg total) by mouth 2 (two) times daily., Disp: 180 tablet, Rfl: 3   atorvastatin (LIPITOR) 80 MG tablet, Take 1 tablet (80 mg total) by mouth daily., Disp: 90 tablet, Rfl: 1   carvedilol (COREG) 6.25 MG tablet, TAKE 1 TABLET BY MOUTH 2 TIMES DAILY WITH A MEAL, Disp: 60 tablet, Rfl: 0   docusate sodium (COLACE) 100 MG capsule, Take 1 capsule (100 mg total) by mouth 2 (two) times daily as needed for mild constipation., Disp: 20 capsule, Rfl: 0   doxycycline (VIBRA-TABS) 100 MG tablet, TAKE 1 TABLET BY MOUTH EVERY TWELVE HOURS, Disp: 5 tablet, Rfl: 0   FEROSUL 325 (65 Fe) MG tablet, TAKE 1 TABLET BY MOUTH EVERY DAY WITH BREAKFAST (Patient taking differently: Take 325 mg by mouth daily.), Disp: 30 tablet, Rfl: 8   furosemide (LASIX) 20 MG tablet, Take 20 mg by mouth daily x 3 days, Disp:  30 tablet, Rfl: 0   hydrALAZINE (APRESOLINE) 50 MG tablet, Take 1 tablet (50 mg total) by mouth 3 (three) times daily., Disp: 270 tablet, Rfl: 3   hydrocortisone cream 1 %, Apply 1 application topically daily as needed for itching., Disp: , Rfl:    mirtazapine (REMERON) 15 MG tablet, TAKE 1 TABLET BY MOUTH AT BEDTIME, Disp: 30 tablet, Rfl: 2   Multiple Vitamin (MULTIVITAMIN) capsule, Take 1 capsule by mouth daily., Disp: , Rfl:    nicotine (NICODERM CQ - DOSED IN MG/24 HOURS) 21 mg/24hr patch, Place 1 patch (21 mg total) onto the skin daily., Disp: 30 patch, Rfl: 3   pantoprazole (PROTONIX) 40 MG tablet, TAKE 1 TABLET BY MOUTH EVERY DAY, Disp:  30 tablet, Rfl: 0   predniSONE (DELTASONE) 5 MG tablet, Label  & dispense according to the schedule below. take 8 Pills PO for 3 days, 6 Pills PO for 3 days, 4 Pills PO for 3 days, 2 Pills PO for 3 days, 1 Pills PO for 3 days, 1/2 Pill  PO for 3 days then STOP. Total 65 pills., Disp: 65 tablet, Rfl: 0   Tiotropium Bromide-Olodaterol (STIOLTO RESPIMAT) 2.5-2.5 MCG/ACT AERS, Inhale 2 puffs into the lungs daily., Disp: 4 g, Rfl: 0   traMADol (ULTRAM) 50 MG tablet, Take 1 tablet (50 mg total) by mouth every 6 (six) hours as needed. (Patient taking differently: Take 50 mg by mouth every 6 (six) hours as needed for moderate pain.), Disp: 20 tablet, Rfl: 0   Allergies  Allergen Reactions   Clonidine Derivatives Other (See Comments)    Patch causes scars   Spironolactone Rash     Review of Systems  Constitutional: Negative.  Negative for malaise/fatigue.  Respiratory: Negative.  Negative for shortness of breath.   Cardiovascular: Negative.  Negative for chest pain and palpitations.  Gastrointestinal: Negative.   Neurological: Negative.  Negative for headaches.    Today's Vitals   03/16/21 1046  BP: (!) 102/58  Pulse: 89  Temp: 98.3 F (36.8 C)  TempSrc: Oral   There is no height or weight on file to calculate BMI.  Wt Readings from Last 3 Encounters:  02/10/21 118 lb 12.8 oz (53.9 kg)  01/25/21 122 lb 6.4 oz (55.5 kg)  12/18/20 130 lb (59 kg)    Objective:  Physical Exam Vitals reviewed.  Constitutional:      General: She is not in acute distress.    Appearance: Normal appearance.  Cardiovascular:     Rate and Rhythm: Normal rate and regular rhythm.     Pulses: Normal pulses.     Heart sounds: Normal heart sounds. No murmur heard. Pulmonary:     Effort: Pulmonary effort is normal. No respiratory distress.     Breath sounds: Normal breath sounds. No wheezing.  Skin:    Capillary Refill: Capillary refill takes less than 2 seconds.  Neurological:     General: No focal deficit  present.     Mental Status: She is alert and oriented to person, place, and time.     Cranial Nerves: No cranial nerve deficit.     Motor: No weakness.  Psychiatric:        Mood and Affect: Mood normal.        Behavior: Behavior normal.        Thought Content: Thought content normal.        Judgment: Judgment normal.        Assessment And Plan:     1. Essential  hypertension Comments: Blood pressure is well controlled - BMP8+eGFR  2. Low magnesium level Comments: Had low magnesium level, will check magnesium level - Magnesium  3. Generalized weakness Comments: Improving and able to get around a little better - Ambulatory referral to Physical Therapy  4. Hemiparesis affecting nondominant 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 (CVA) ([place]) - Ambulatory referral to Physical Therapy  5. Tobacco abuse Comments: She has not smoked in 2 months, would like to continue with nicotine patches Congratulating on quitting - nicotine (NICODERM CQ - DOSED IN MG/24 HOURS) 21 mg/24hr patch; Place 1 patch (21 mg total) onto the skin daily.  Dispense: 30 patch; Refill: 3  6. Encounter for immunization - Varicella-zoster vaccine IM (Shingrix)     Patient was given opportunity to ask questions. Patient verbalized understanding of the plan and was able to repeat key elements of the plan. All questions were answered to their satisfaction.  [name], FNP   I, [name], FNP, have reviewed all documentation for this visit. The documentation on 03/321 for the exam, diagnosis, procedures, and orders are all accurate and complete.   IF YOU HAVE BEEN REFERRED TO A SPECIALIST, IT MAY TAKE 1-2 WEEKS TO SCHEDULE/PROCESS THE REFERRAL. IF YOU HAVE NOT HEARD FROM US/SPECIALIST IN TWO WEEKS, PLEASE GIVE [place] A CALL AT [phone].   THE PATIENT IS ENCOURAGED TO PRACTICE SOCIAL DISTANCING DUE TO THE COVID-19 PANDEMIC.

## 2021-03-16 NOTE — Patient Instructions (Signed)

## 2021-03-24 ENCOUNTER — Telehealth: Payer: Self-pay

## 2021-03-24 ENCOUNTER — Ambulatory Visit: Payer: Self-pay

## 2021-03-24 ENCOUNTER — Telehealth: Payer: Medicaid Other

## 2021-03-24 DIAGNOSIS — I69359 Hemiplegia and hemiparesis following cerebral infarction affecting unspecified side: Secondary | ICD-10-CM

## 2021-03-24 DIAGNOSIS — F32A Depression, unspecified: Secondary | ICD-10-CM

## 2021-03-24 DIAGNOSIS — R531 Weakness: Secondary | ICD-10-CM

## 2021-03-24 DIAGNOSIS — I10 Essential (primary) hypertension: Secondary | ICD-10-CM

## 2021-03-24 NOTE — Telephone Encounter (Cosign Needed)
  Care Management   Follow Up Note   03/24/2021 Name: UNICE KOELZER MRN: [id number] DOB: Mar 07, 1956   Referred by: [name], FNP Reason for referral : Chronic Care Management (RN CM Follow up call - 2nd attempt )   A second unsuccessful telephone outreach was attempted today. The patient was referred to the case management team for assistance with care management and care coordination.   Follow Up Plan: A HIPPA compliant phone message was left for the patient providing contact information and requesting a return call.   Barb Merino, RN, BSN, CCM Care Management Coordinator [place] [facility]  Direct Phone: [phone] '

## 2021-03-30 ENCOUNTER — Other Ambulatory Visit: Payer: Medicaid Other

## 2021-03-30 NOTE — Chronic Care Management (AMB) (Signed)
Care Management    RN Visit Note  03/24/2021 Name: Kimberly [name] MRN: [id number] DOB: 1956/03/30  Subjective: Kimberly [name] is a 65 y.o. year old female who is a primary care patient of [name], [place]. The care management team was consulted for assistance with disease management and care coordination needs.    Engaged with patient by telephone for follow up visit in response to provider referral for case management and/or care coordination services.   Consent to Services:   Kimberly [name] was given information about Care Management services today including:  Care Management services includes personalized support from designated clinical staff supervised by her physician, including individualized plan of care and coordination with other care providers 24/7 contact phone numbers for assistance for urgent and routine care needs. The patient may stop case management services at any time by phone call to the office staff.  Patient agreed to services and consent obtained.   Assessment: Review of patient past medical history, allergies, medications, health status, including review of consultants reports, laboratory and other test data, was performed as part of comprehensive evaluation and provision of chronic care management services.   SDOH (Social Determinants of Health) assessments and interventions performed:  Yes, no acute challenges   Care Plan  Allergies  Allergen Reactions   Clonidine Derivatives Other (See Comments)    Patch causes scars   Spironolactone Rash    Outpatient Encounter Medications as of 03/24/2021  Medication Sig   acetaminophen (TYLENOL) 325 MG tablet Take 2 tablets (650 mg total) by mouth every 6 (six) hours as needed for moderate pain, mild pain, fever or headache.   amLODipine (NORVASC) 10 MG tablet TAKE 1 TABLET BY MOUTH EVERY DAY   apixaban (ELIQUIS) 5 MG TABS tablet Take 1 tablet (5 mg total) by mouth 2 (two) times daily.   atorvastatin (LIPITOR) 80  MG tablet Take 1 tablet (80 mg total) by mouth daily.   carvedilol (COREG) 6.25 MG tablet TAKE 1 TABLET BY MOUTH 2 TIMES DAILY WITH A MEAL   docusate sodium (COLACE) 100 MG capsule Take 1 capsule (100 mg total) by mouth 2 (two) times daily as needed for mild constipation.   doxycycline (VIBRA-TABS) 100 MG tablet TAKE 1 TABLET BY MOUTH EVERY TWELVE HOURS   FEROSUL 325 (65 Fe) MG tablet TAKE 1 TABLET BY MOUTH EVERY DAY WITH BREAKFAST (Patient taking differently: Take 325 mg by mouth daily.)   furosemide (LASIX) 20 MG tablet Take 20 mg by mouth daily x 3 days   hydrALAZINE (APRESOLINE) 50 MG tablet Take 1 tablet (50 mg total) by mouth 3 (three) times daily.   hydrocortisone cream 1 % Apply 1 application topically daily as needed for itching.   mirtazapine (REMERON) 15 MG tablet TAKE 1 TABLET BY MOUTH AT BEDTIME   Multiple Vitamin (MULTIVITAMIN) capsule Take 1 capsule by mouth daily.   nicotine (NICODERM CQ - DOSED IN MG/24 HOURS) 21 mg/24hr patch Place 1 patch (21 mg total) onto the skin daily.   pantoprazole (PROTONIX) 40 MG tablet TAKE 1 TABLET BY MOUTH EVERY DAY   predniSONE (DELTASONE) 5 MG tablet Label  & dispense according to the schedule below. take 8 Pills PO for 3 days, 6 Pills PO for 3 days, 4 Pills PO for 3 days, 2 Pills PO for 3 days, 1 Pills PO for 3 days, 1/2 Pill  PO for 3 days then STOP. Total 65 pills.   Tiotropium Bromide-Olodaterol (STIOLTO RESPIMAT) 2.5-2.5 MCG/ACT AERS Inhale 2 puffs  into the lungs daily.   traMADol (ULTRAM) 50 MG tablet Take 1 tablet (50 mg total) by mouth every 6 (six) hours as needed. (Patient taking differently: Take 50 mg by mouth every 6 (six) hours as needed for moderate pain.)   No facility-administered encounter medications on file as of 03/24/2021.    Patient Active Problem List   Diagnosis Date Noted   Pulmonary nodule 01/25/2021   Shortness of breath 01/25/2021   Sepsis ([place]) 12/18/2020   Family history of malignant neoplasm of gastrointestinal  tract 12/07/2020   Personal history of colonic polyps 12/07/2020   Secondary hypercoagulable state ([place]) 10/14/2020   Right sided abdominal pain    Abdominal wall pain    Atrial fibrillation ([place]) 08/19/2020   Educated about COVID-19 virus infection 11/16/2019   Urinary frequency 04/14/2019   Cough 04/14/2019   AKI (acute kidney injury) ([place]) 11/26/2018   Diarrhea 11/25/2018   Dehydration, mild 11/25/2018   Hemiparesis affecting nondominant 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 ([place]) 07/31/2018   Muscle ache 07/31/2018   Prediabetes 07/31/2018   Chronic gout without tophus 07/31/2018   Vision loss 07/31/2018   Malnutrition of moderate degree 02/15/2018   Carotid occlusion, bilateral    Atrial fibrillation with RVR ([place]) 02/13/2018   Positive D dimer 02/13/2018   Back pain 02/13/2018   CVA (cerebral vascular accident) ([place]) 02/13/2018   Paroxysmal atrial fibrillation ([place]) 02/13/2018   Coronary artery disease involving native coronary artery of native heart without angina pectoris 02/13/2018   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[place]) 02/13/2018   Aortic atherosclerosis ([place]) 02/13/2018   CAD, ARTERY BYPASS GRAFT 05/03/2009   TOBACCO USE, QUIT 05/03/2009   ROTATOR CUFF SYNDROME, LEFT 02/12/2009   Shoulder pain, left 11/03/2008   HAMMER TOE, ACQUIRED 02/11/2008   INSECT BITE, LEG 02/11/2008   Cerebral artery occlusion with cerebral infarction ([place]) 12/05/2007   Dyslipidemia 11/05/2007   UNSPECIFIED DISORDER TEETH&SUPPORTING STRUCTURES 11/05/2007   BICEPS TENDINITIS, RIGHT 04/24/2007   Essential hypertension 04/23/2007   MICROCYTOSIS 04/23/2007    Conditions to be addressed/monitored: HTN, Depression, and Generalized weakness, hemiparesis affecting nondominant side as late effect of CVA  Care Plan : Wellness (Adult)  Updates made by Lynne Logan, RN since 03/24/2021 12:00 AM     Problem: Cognitive Function  (Wellness)   Priority: High     Long-Range Goal: Cognitive Function Enhanced   Start Date: 07/21/2020  Expected End Date: 07/21/2021  Recent Progress: On track  Priority: High  Note:   Current Barriers:  Ineffective Self Health Maintenance Unable to self administer medications as prescribed Lacks social connections Currently UNABLE TO independently self manage needs related to chronic health conditions.  Knowledge Deficits related to short term plan for care coordination needs and long term plans for chronic disease management needs Nurse Case Manager Clinical Goal(s):  Patient will work with care management team to address care coordination and chronic disease management needs related to Disease Management Psychosocial Support Caregiver Stress support   Interventions:  03/24/21 completed successful outbound call with sister Marinda Elk with [name] FNP regarding development and update of comprehensive plan of care as evidenced by provider attestation and co-signature Inter-disciplinary care team collaboration (see longitudinal plan of care) Assessed for changes in cognitive function; Encouraged physical activity or exercise based on ability and tolerance Determined patient's daughter is staying with her from out of town but has taken some time off work Determined patient's mood and cognitive  function have improved since having her daughter stay and care for her Discussed plans with patient for ongoing care management follow up and provided patient with direct contact information for care management team Patient Goals/Self Care Activities: - stay in touch with my family and friends - follow up with PCP for ongoing evaluation and treatment of depression and or decline in cognitive ability/thinking   -Implement daily exercise routine as tolerated -Participate in cognitively stimulating activity such as card playing with caregiver/family  Follow Up Plan: Telephone follow up  appointment with care management team member scheduled for: 08/16/21    Care Plan : Depression (Adult)  Updates made by Lynne Logan, RN since 03/24/2021 12:00 AM     Problem: Symptoms (Depression)   Priority: High     Long-Range Goal: Symptoms Monitored and Managed   Start Date: 07/21/2020  Expected End Date: 07/21/2021  Recent Progress: On track  Priority: High  Note:   Current Barriers:  Ineffective Self Health Maintenance Unable to self administer medications as prescribed Lacks social connections Currently UNABLE TO independently self manage needs related to chronic health conditions.  Knowledge Deficits related to short term plan for care coordination needs and long term plans for chronic disease management needs Nurse Case Manager Clinical Goal(s):  Patient will work with care management team to address care coordination and chronic disease management needs related to Disease Management Educational Needs Psychosocial Support   Interventions:  03/24/21 successful call completed with sister Marinda Elk with [name] FNP regarding development and update of comprehensive plan of care as evidenced by provider attestation and co-signature Inter-disciplinary care team collaboration (see longitudinal plan of care) Provided education to patient about basic disease process related to Depression  Review of patient status, including review of consultant's reports, relevant laboratory and other test results, and medications completed. Reviewed medications with patient and discussed importance of medication adherence  Determined patient's cognition and mood are stable Discussed patient's daughter is currently taking time off work and is staying with Ms. Jobst to help care for her, this has improved patient's mood Discussed the family is visiting patient more often and taking her for Sunday drives as well as getting her out of the house more often for dinner dates Encouraged  sister [name] to continue to monitor patient's condition and notify the CCM team and the PCP of any/all concerns  Discussed plans with patient for ongoing care management follow up and provided patient with direct contact information for care management team Patient Goals/Self Care Activities:  - stay in touch with my family and friends - follow up with PCP for ongoing evaluation and treatment of depression    Follow Up Plan: Telephone follow up appointment with care management team member scheduled for: 08/16/21     Care Plan : Hypertension (Adult)  Updates made by Lynne Logan, RN since 03/24/2021 12:00 AM     Problem: Resistant Hypertension (Hypertension)   Priority: High     Long-Range Goal: Response to Treatment Maximized   Start Date: 07/21/2020  Expected End Date: 07/21/2021  Recent Progress: On track  Priority: High  Note:   Objective:  Last practice recorded BP readings:  BP Readings from Last 3 Encounters:  03/16/21 (!) 102/58  02/10/21 99/69  01/25/21 112/60   Most recent eGFR/CrCl:  Lab Results  Component Value Date   EGFR 74 01/04/2021    No components found for: CRCL  Current Barriers:  Knowledge Deficits related to basic understanding of hypertension pathophysiology and  self care management Unable to self administer medications as prescribed Lacks social connections Nurse Case Manager Clinical Goal(s):  Patient will verbalize basic understanding of hypertension disease process and self health management plan as evidenced by patient's caregiver will obtain a BP cuff for home use and verbalize knowledge related to when to contact the PCP for abnormal BP readings. Interventions:  03/24/21 completed successful outbound call with sister Marinda Elk with [name] FNP regarding development and update of comprehensive plan of care as evidenced by provider attestation and co-signature Inter-disciplinary care team collaboration (see longitudinal plan of  care) Evaluation of current treatment plan related to hypertension self management and patient's adherence to plan as established by provider. Provided education to patient about basic disease process related to Hypertension Review of patient status, including review of consultant's reports, relevant laboratory and other test results, and medications completed. Reviewed medications with caregiver and discussed importance of compliance Advised caregiver, providing education and rationale, to monitor blood pressure daily and record, calling PCP for findings outside established parameters.  Discussed plans with patient for ongoing care management follow up and provided patient with direct contact information for care management team Patient Goals/Self-Care Activities:   - Self administer medications as prescribed  - Attend all scheduled provider appointments  - Call provider office for new concerns, questions, or BP outside discussed parameters  - Check BP and record as discussed  - Follows a low sodium diet  Follow Up Plan: Telephone follow up appointment with care management team member scheduled for: 08/16/21      Plan: Telephone follow up appointment with care management team member scheduled for:  08/16/21  Barb Merino, RN, BSN, CCM Care Management Coordinator [place] [facility]  Direct Phone: [phone]

## 2021-03-30 NOTE — Patient Instructions (Signed)
Goals Addressed       Patient Stated     Depression symptoms managed and monitored (pt-stated)   On track     Timeframe:  Long-Range Goal Priority:  High Start Date: 07/21/20                            Expected End Date: 07/21/21                      Follow Up Date: 08/16/20   - stay in touch with my family and friends - follow up with PCP for ongoing evaluation and treatment of depression     Why is this important?   As we age, or sometimes because we have an illness, it feels like our memory and ability to figure things out is not very good.  There are things you can do to keep your memory and your thinking as strong as possible.          Other     Acheive and Maintain adequete weight gain   On track     Timeframe:  Short-Term Goal Priority:  High Start Date: 07/21/20                            Expected End Date: 07/21/21                      Follow Up Date: 08/16/21  Patient Goals/Self-Care Activities:  - set goal weight  - obtain weight scale for home use - monitor weights daily and record, weighing the same time each morning - follow up with the Registered Dietician to help monitor Abnormal weight loss  - Continue drinking high protein oral supplements as prescribed    Why is this important?   When you are ready to manage your nutrition or weight, having a plan and setting goals will help.  Taking small steps to change how you eat and exercise is a good place to start.          Track and Manage My Blood Pressure-Hypertension   On track     Timeframe:  Long-Range Goal Priority:  High Start Date: 07/21/20                            Expected End Date: 07/21/21                      Follow Up Date: 08/16/21  Patient Goals/Self-Activities:   - Self administer medications as prescribed  - Attend all scheduled provider appointments  - Call provider office for new concerns, questions, or BP outside discussed parameters  - Check BP and record as discussed  - Follows a low  sodium diet   Why is this important?   You won't feel high blood pressure, but it can still hurt your blood vessels.  High blood pressure can cause heart or kidney problems. It can also cause a stroke.  Making lifestyle changes like losing a Zanai Mallari weight or eating less salt will help.  Checking your blood pressure at home and at different times of the day can help to control blood pressure.  If the doctor prescribes medicine remember to take it the way the doctor ordered.  Call the office if you cannot afford the medicine or if there are questions  about it.

## 2021-04-07 ENCOUNTER — Other Ambulatory Visit: Payer: Self-pay | Admitting: Nurse Practitioner

## 2021-04-07 DIAGNOSIS — R630 Anorexia: Secondary | ICD-10-CM

## 2021-04-11 ENCOUNTER — Telehealth: Payer: Self-pay

## 2021-04-11 NOTE — Chronic Care Management (AMB) (Signed)
Chronic Care Management Pharmacy Assistant   Name: Kimberly [name]  MRN: [id number] DOB: 1956/03/29  Reason for Encounter: Disease State/ Hypertension, Hyperlipidemia   Recent office visits:  03-16-2021 Kimberly [name], [place]. Referral placed to physical therapy. Shingrix given.  03-24-2021 Little, Kimberly Stapler, RN (CCM)  Recent consult visits:  None  Hospital visits:  Medication Reconciliation was completed by comparing discharge summary, patient's EMR and Pharmacy list, and upon discussion with patient.   Admitted to the hospital on 12-18-2020 due to a fall.  Discharge date was 12-25-2020. Discharged from [place]?Medications Started at [facility] Discharge:?? Tylenol 325 MG tablet every 6 hours as needed Carvedilol 6.25 mg twice daily Colace 100 mg twice daily as needed Doxycycline 100 mg every 12 hours Hydralazine 100 mg every 8 hours Pantoprazole 40 mg daily   Medication Changes at Hospital Discharge: None   Medications Discontinued at Hospital Discharge: Aspirin 81 mg Metoprolol 50 mg Omeprazole    Medications that remain the same after Hospital Discharge:??  -All other medications will remain the same.      Medications: Outpatient Encounter Medications as of 04/11/2021  Medication Sig   acetaminophen (TYLENOL) 325 MG tablet Take 2 tablets (650 mg total) by mouth every 6 (six) hours as needed for moderate pain, mild pain, fever or headache.   amLODipine (NORVASC) 10 MG tablet TAKE 1 TABLET BY MOUTH EVERY DAY   apixaban (ELIQUIS) 5 MG TABS tablet Take 1 tablet (5 mg total) by mouth 2 (two) times daily.   atorvastatin (LIPITOR) 80 MG tablet TAKE 1 TABLET BY MOUTH EVERY DAY   carvedilol (COREG) 6.25 MG tablet TAKE 1 TABLET BY MOUTH 2 TIMES DAILY WITH A MEAL   docusate sodium (COLACE) 100 MG capsule Take 1 capsule (100 mg total) by mouth 2 (two) times daily as needed for mild constipation.   doxycycline (VIBRA-TABS) 100 MG tablet TAKE 1 TABLET BY  MOUTH EVERY TWELVE HOURS   FEROSUL 325 (65 Fe) MG tablet TAKE 1 TABLET BY MOUTH EVERY DAY WITH BREAKFAST (Patient taking differently: Take 325 mg by mouth daily.)   furosemide (LASIX) 20 MG tablet Take 20 mg by mouth daily x 3 days   hydrALAZINE (APRESOLINE) 50 MG tablet Take 1 tablet (50 mg total) by mouth 3 (three) times daily.   hydrocortisone cream 1 % Apply 1 application topically daily as needed for itching.   mirtazapine (REMERON) 15 MG tablet TAKE 1 TABLET BY MOUTH AT BEDTIME   Multiple Vitamin (MULTIVITAMIN) capsule Take 1 capsule by mouth daily.   nicotine (NICODERM CQ - DOSED IN MG/24 HOURS) 21 mg/24hr patch Place 1 patch (21 mg total) onto the skin daily.   pantoprazole (PROTONIX) 40 MG tablet TAKE 1 TABLET BY MOUTH EVERY DAY   predniSONE (DELTASONE) 5 MG tablet Label  & dispense according to the schedule below. take 8 Pills PO for 3 days, 6 Pills PO for 3 days, 4 Pills PO for 3 days, 2 Pills PO for 3 days, 1 Pills PO for 3 days, 1/2 Pill  PO for 3 days then STOP. Total 65 pills.   Tiotropium Bromide-Olodaterol (STIOLTO RESPIMAT) 2.5-2.5 MCG/ACT AERS Inhale 2 puffs into the lungs daily.   traMADol (ULTRAM) 50 MG tablet Take 1 tablet (50 mg total) by mouth every 6 (six) hours as needed. (Patient taking differently: Take 50 mg by mouth every 6 (six) hours as needed for moderate pain.)   No facility-administered encounter medications on file as of  04/11/2021.   Reviewed chart prior to disease state call. Spoke with patient regarding BP  Recent Office Vitals: BP Readings from Last 3 Encounters:  03/16/21 (!) 102/58  02/10/21 99/69  01/25/21 112/60   Pulse Readings from Last 3 Encounters:  03/16/21 89  02/10/21 (!) 58  01/25/21 70    Wt Readings from Last 3 Encounters:  02/10/21 118 lb 12.8 oz (53.9 kg)  01/25/21 122 lb 6.4 oz (55.5 kg)  12/18/20 130 lb (59 kg)     Kidney Function Lab Results  Component Value Date/Time   CREATININE 0.87 01/04/2021 04:38 PM   CREATININE  1.09 (H) 12/21/2020 02:51 AM   CREATININE 0.98 05/04/2014 10:47 AM   GFRNONAA 57 (L) 12/21/2020 02:51 AM   GFRAA >60 04/27/2020 02:27 PM    BMP Latest Ref Rng & Units 01/04/2021 12/21/2020 12/20/2020  Glucose 65 - 99 mg/dL 99 141(H) 96  BUN 8 - 27 mg/dL '23 17 12  '$ Creatinine 0.57 - 1.00 mg/dL 0.87 1.09(H) 0.80  BUN/Creat Ratio 12 - 28 26 - -  Sodium 134 - 144 mmol/L 140 135 139  Potassium 3.5 - 5.2 mmol/L 4.4 3.8 3.7  Chloride 96 - 106 mmol/L 103 108 112(H)  CO2 20 - 29 mmol/L 18(L) 21(L) 22  Calcium 8.7 - 10.3 mg/dL 9.4 8.5(L) 8.7(L)    Current antihypertensive regimen:  Amlodipine 10 mg daily Hydralazine 50 mg taking 1 tablet by mouth three times daily Carvedilol 6.25 mg taking 1 tablet by mouth 2 times daily  How often are you checking your Blood Pressure? 1-2x per week  Current home BP readings: Patient's sister states it's been normal but wasn't around her blood pressure log.  What recent interventions/DTPs have been made by any provider to improve Blood Pressure control since last CPP Visit:  Educated on Importance of home blood pressure monitoring Proper BP monitoring technique Counseled to monitor BP at home three times per week, document, and provide log at future appointments  Any recent hospitalizations or ED visits since last visit with CPP? No  What diet changes have been made to improve Blood Pressure Control?  Patient's sister states since she doesn't have a big appetite so they allow her to eat whatever she wants when hungry.  What exercise is being done to improve your Blood Pressure Control?  Patient's sister states patient isn't really active. Patient's insurance didn't cover home physical therapy. Patient's sister states the family is working on taking her to a facility 3 days out the week for therapy which starts next week.  Adherence Review: Is the patient currently on ACE/ARB medication? No Does the patient have >5 day gap between last estimated fill dates?  No  04/11/2021 Name: Kimberly [name] MRN: [id number] DOB: 1956-07-25 Kimberly [name] is a 65 y.o. 65 year old female who is a primary care patient of Kimberly [name], [place].  Comprehensive medication review performed; Spoke to patient regarding cholesterol  Lipid Panel    Component Value Date/Time   CHOL 114 09/02/2019 1651   TRIG 58 09/02/2019 1651   HDL 47 09/02/2019 1651   LDLCALC 54 09/02/2019 1651    10-year ASCVD risk score: The ASCVD Risk score Mikey Bussing DC Jr., et al., 2013) failed to calculate for the following reasons:   The patient has a prior MI or stroke diagnosis  Current antihyperlipidemic regimen:  Atorvastatin 80 mg daily  Previous antihyperlipidemic medications tried:  None  ASCVD risk enhancing conditions: HTN and current smoker  What recent interventions/DTPs have  been made by any provider to improve Cholesterol control since last CPP Visit:  Educated on Cholesterol goals Importance of limiting foods high in cholesterol  Any recent hospitalizations or ED visits since last visit with CPP? No  What diet changes have been made to improve Cholesterol?  Patient's sister states since she doesn't have a big appetite so they allow her to eat whatever she wants when hungry.  What exercise is being done to improve Cholesterol?   Patient's sister states patient isn't really active. Patient's insurance didn't cover home physical therapy. Patient's sister states the family is working on taking her to a facility 3 days out the week for therapy which starts next week.  Adherence Review: Does the patient have >5 day gap between last estimated fill dates? No  NOTES: Patient's sister stated patient started back smoking last week. Patient smokes at least 2 cigarettes daily.   Care Gaps: Covid booster overdue Mammogram overdue PAP smear overdue  Star Rating Drugs: Atorvastatin 80 mg- Last filled 03-14-2021 30 DS [place] Clinical Pharmacist  Assistant [phone]

## 2021-04-12 ENCOUNTER — Ambulatory Visit: Payer: Medicaid Other | Admitting: Neurology

## 2021-04-13 ENCOUNTER — Ambulatory Visit: Payer: Medicaid Other | Admitting: Cardiology

## 2021-04-14 ENCOUNTER — Ambulatory Visit: Payer: Medicaid Other | Attending: Nurse Practitioner | Admitting: Physical Therapy

## 2021-04-14 ENCOUNTER — Other Ambulatory Visit: Payer: Self-pay

## 2021-04-14 VITALS — BP 110/73 | HR 95

## 2021-04-14 DIAGNOSIS — M6281 Muscle weakness (generalized): Secondary | ICD-10-CM | POA: Insufficient documentation

## 2021-04-14 DIAGNOSIS — R262 Difficulty in walking, not elsewhere classified: Secondary | ICD-10-CM | POA: Insufficient documentation

## 2021-04-14 DIAGNOSIS — R2681 Unsteadiness on feet: Secondary | ICD-10-CM | POA: Diagnosis present

## 2021-04-14 DIAGNOSIS — R2689 Other abnormalities of gait and mobility: Secondary | ICD-10-CM | POA: Insufficient documentation

## 2021-04-14 DIAGNOSIS — Z9181 History of falling: Secondary | ICD-10-CM | POA: Diagnosis present

## 2021-04-14 NOTE — Therapy (Addendum)
[facility] [address] [place], [place], [address] Phone: [phone]   Fax:  [phone]  Physical Therapy Evaluation  Patient Details  Name: Kimberly [name] MRN: [id number] Date of Birth: 09/02/1955 Referring Provider (PT): [name], FNP   Encounter Date: 04/14/2021   PT End of Session - 04/14/21 1325     Visit Number 1    Number of Visits 13    Date for PT Re-Evaluation 07/13/21    Authorization Type Traditional Medicaid - awaiting auth    PT Start Time [id number]    PT Stop Time 1315    PT Time Calculation (min) 45 min    Equipment Utilized During Treatment Gait belt    Activity Tolerance Patient tolerated treatment well;Patient limited by fatigue    Behavior During Therapy [facility] for tasks assessed/performed             Past Medical History:  Diagnosis Date   Bicipital tenosynovitis    Coronary artery disease 05/2007   [id number]    Fall 02/13/2018   Family history of ischemic heart disease    Gout    Hammer toe    Hip, thigh, leg, and ankle, insect bite, nonvenomous, without mention of infection(916.4)    Hyperlipidemia    Hypertension    Other abnormality of red blood cells    Rotator cuff syndrome of left shoulder    Stroke [facility])     Past Surgical History:  Procedure Laterality Date   CARDIAC CATHETERIZATION     COLONOSCOPY WITH PROPOFOL N/A 01/30/2020   Procedure: COLONOSCOPY WITH PROPOFOL;  Surgeon: Carol Ada, MD;  Location: [facility]COPY;  Service: Endoscopy;  Laterality: N/A;   CORONARY ARTERY BYPASS GRAFT     triple   CORONARY ARTERY BYPASS GRAFT  2008   POLYPECTOMY  01/30/2020   Procedure: POLYPECTOMY;  Surgeon: Carol Ada, MD;  Location: [facility];  Service: Endoscopy;;   TUBAL LIGATION      Vitals:   04/14/21 1240  BP: 110/73  Pulse: 95      Subjective Assessment - 04/14/21 1241     Subjective Hx of CVAs with L sided weakness. Hospitalized in 12/2020 - near syncope in  the setting of dehydration, possible sepsis. Was discharged home from the hospital and did not receive any home health therapies. Has a RW at home - walks to the bathroom and up and down the hallway. Remainder of the day is seated in a w/c. Prior to recent hospitalizaiton, was still doing minimal walking at home with RW due to CVA. No recent falls. Feels like she is losing her balance a lot, uses the RW at all times.    Patient is accompained by: Family member   family, Lelan Pons   Pertinent History PMH: paroxysmal A. fib on Eliquis, prior CVA x2 with residual L-sided weakness, ascending aortic aneurysm,essential HTN, hyperlipidemia, chronic anxiety/depression, GERD, tobacco use disorder, physical debility, CABG x3 (2008)    Limitations Standing;Walking;House hold activities    How long can you walk comfortably? fatigued after walking 30-40' back into therapy gym.    Patient Stated Goals wants to stop losing her balance so much and not get SOB.    Currently in Pain? No/denies                [facility] PT Assessment - 04/14/21 1249       Assessment   Medical Diagnosis hx of CVA, fall, weakness    Referring Provider (PT) [name], FNP  Onset Date/Surgical Date 03/16/21   date of referral   Hand Dominance Right    Prior Therapy previous PT after CVA at skilled nursing facility      Precautions   Precautions Fall      Balance Screen   Has the patient fallen in the past 6 months No    Has the patient had a decrease in activity level because of a fear of falling?  No    Is the patient reluctant to leave their home because of a fear of falling?  No      Home Environment   Living Environment Private residence    Living Arrangements Alone    Available Help at Discharge Family;Other (Comment);Neighbor   sister, daughter lives in [place], has neighbors check on her, caregiver there for 2.5 hrs a day   Type of [place] to enter    Entrance Stairs-Number of Steps  4    Entrance Stairs-Rails None   when pt comes out for appts and outings/ family helps pt get in and out   Home Layout One level    [place] - standard;Bedside commode;Shower seat    Additional Comments is on a waiting list for a handicapped apartment (has been on it for a year and a half)      Prior Function   Level of Independence Needs assistance with ADLs;Needs assistance with gait;Independent with household mobility with device    Leisure likes going to the science center, the parks, go out to eat with family      Sensation   Light Touch Impaired Detail    Light Touch Impaired Details Impaired LLE      Coordination   Gross Motor Movements are Fluid and Coordinated No    Heel Shin Test impaired bilat, unable to perform on LLE due to weakness from CVA      Posture/Postural Control   Posture/Postural Control Postural limitations    Postural Limitations Forward head      ROM / Strength   AROM / PROM / Strength Strength;AROM      AROM   Overall AROM Comments limited with LLE due to weakness      Strength   Overall Strength Within functional limits for tasks performed    Strength Assessment Site Hip;Knee;Ankle    Right/Left Hip Left;Right    Right Hip Flexion 4-/5    Left Hip Flexion 3+/5    Right/Left Knee Right;Left    Right Knee Flexion 5/5    Right Knee Extension 5/5    Left Knee Flexion 3+/5    Left Knee Extension 3-/5    Right/Left Ankle Right;Left    Right Ankle Dorsiflexion 5/5    Left Ankle Dorsiflexion 4-/5      Transfers   Transfers Sit to Stand;Stand to Sit    Sit to Stand 4: Min guard    Stand to Sit 4: Min guard    Comments from manual w/c to RW -cues for proper hand placement      Ambulation/Gait   Ambulation/Gait Yes    Ambulation/Gait Assistance 4: Min guard;4: Min assist    Ambulation/Gait Assistance Details assist needing to help with steering RW, tends to veer off to R due to LUE weakness from CVA.    Assistive device Rolling  walker    Gait Pattern Step-to pattern;Step-through pattern;Decreased stance time - left;Decreased step length - right;Decreased hip/knee flexion - right;Decreased hip/knee flexion - left;Decreased dorsiflexion -  right;Decreased dorsiflexion - left;Decreased weight shift to left;Right foot flat;Left foot flat;Trunk flexed    Ambulation Surface Level;Indoor    Gait velocity 34.87 seconds in 20 ft = .57 ft/sec    Gait Comments when ambulating 20-30' in from waiting room, pt needing manual w/c to be wheeled back into session due to fatigue. with assessment of gait speed in 20' - needing w/c follow from pt's daughter. cues to stay closer to RW                        Objective measurements completed on examination: See above findings.               PT Education - 04/14/21 1324     Education Details clincal findings, Medicaid visit limit, POC, PT to send a referral request for OT due to UE weakness due to CVA    Person(s) Educated Patient;Child(ren)   pt's sister   Methods Explanation    Comprehension Verbalized understanding              PT Short Term Goals - 04/15/21 0719       PT SHORT TERM GOAL #1   Title Pt and pt's family member will be independent with initial HEP in order to build upon functional gains made in therapy. ALL STGS DUE AFTER 3RD VISIT.    Baseline currently dependent    Time 3    Period --   visits   Status New    Target Date 05/13/21      PT SHORT TERM GOAL #2   Title Pt will undergo TUG with LTG written in order to demo decr fall risk.    Baseline not assessed at eval    Time 3   visits   Period Weeks    Status New      PT SHORT TERM GOAL #3   Title Pt will improve gait speed with RW to at least .9 ft/sec in order to demo decr fall risk and improved household mobility.    Baseline .80 ft/sec    Time 3   visits   Period Weeks    Status New      PT SHORT TERM GOAL #4   Title Pt will ambulate at least 69' with RW and min guard  in order to demo improved household mobility.    Baseline can ambulate 20-30' with min guard/min A before needing to sit due to fatigue.    Time 3   visits   Period Weeks    Status New               PT Long Term Goals - 04/15/21 0725       PT LONG TERM GOAL #1   Title Pt and pt's family member will be independent with final HEP in order to build upon functional gains made in therapy. ALL LTGS DUE AT 13TH VISIT.    Baseline currently dependent    Time 13   visit   Period --    Status New    Target Date 06/10/21      PT LONG TERM GOAL #2   Title TUG goal to be written as appropriate in order to demo decr fall risk.    Baseline not yet assessed.    Time 13    Period --   visits   Status New      PT LONG TERM GOAL #3   Title Pt will improve  gait speed with RW vs. LRAD to at least 1.2 ft/sec in order to demo decr fall risk and improved household mobility.    Baseline .57 ft/sec    Time 13    Period Weeks   visits   Status New      PT LONG TERM GOAL #4   Title Pt will ambulate at least 115' with RW vs. LRAD and supervision/min guard in order to demo improved household mobility.    Baseline 20-30' with RW with min guard/min A    Time 13   visits   Period Weeks    Status New      PT LONG TERM GOAL #5   Title Pt will go up and down 4 steps with LRAD and step to pattern with no handrails with family providing min A in order to safely enter/exit apartment for community outings.    Baseline did not perform at eval, pt does not have handrails at apartment complex, family assist pt descend stairs    Time 13   visits   Period Weeks    Status New                    Plan - 04/15/21 0732     Clinical Impression Statement Patient is a 65 year old female referred to Neuro OPPT for hx of CVAs, hx of falls, and weakness.   Pt's PMH is significant for: paroxysmal A. fib on Eliquis, prior CVA x2 with residual L-sided weakness, ascending aortic aneurysm,essential HTN,  hyperlipidemia, chronic anxiety/depression, GERD, tobacco use disorder, physical debility, CABG x3 (2008).  Pt hospitalized in 12/2020 - Near syncope in the setting of dehydration, possible sepsis. Did not receive therapy after hospitalization. Uses a RW for small household distances (was also doing this prior to hospitalization) and pt lives alone with family/neighbor support nearby. The following deficits were present during the exam: decr coordination, postural abnormalities, decr strength, impaired sensation, decr ability for functional transfers, gait abnormalities, balance impairments, decr ROM. Based on gait speed, pt is an incr risk for falls. Pt currently needing min guard/min A to ambulate with RW and fatigues after 20-30' (needing a w/c follow). Pt would benefit from skilled PT to address these impairments and functional limitations to maximize functional mobility independence and decr fall risk.    Personal Factors and Comorbidities Comorbidity 3+;Time since onset of injury/illness/exacerbation;Past/Current Experience;Behavior Pattern;Fitness    Comorbidities paroxysmal A. fib on Eliquis, prior CVA x2 with residual L-sided weakness, ascending aortic aneurysm,essential HTN, hyperlipidemia, chronic anxiety/depression, GERD, tobacco use disorder, physical debility, CABG x3 (2008)    Examination-Activity Limitations Bathing;Hygiene/Grooming;Dressing;Locomotion Level;Stairs;Transfers;Stand    Examination-Participation Restrictions Cleaning;Community Activity;Laundry    Stability/Clinical Decision Making Evolving/Moderate complexity    Clinical Decision Making Moderate    Rehab Potential Good    PT Frequency 1x / week   1-2x week for 3 weeks, followed by 2x week for 5 weeks   PT Duration 3 weeks   1-2x week for 3 weeks, followed by 2x week for 5 weeks   PT Treatment/Interventions ADLs/Self Care Home Management;DME Instruction;Gait training;Stair training;Therapeutic activities;Functional mobility  training;Therapeutic exercise;Balance training;Neuromuscular re-education;Patient/family education;Orthotic Fit/Training;Manual techniques;Passive range of motion;Vestibular    PT Next Visit Plan initial HEP for strength/ROM. perform TUG with RW with goal written. gait training with RW vs. LRAD and working on endurance. monitor vitals with activity. **need to request auth after 3 visits - would need to cancel 2nd appt in 2nd week to ask for additional auth (i messed  up with this with getting pt scheduled)    Recommended Other Services OT eval - PT to send referral request    Consulted and Agree with Plan of Care Patient;Family member/caregiver    Family Member Consulted pt's sister and daughter             Patient will benefit from skilled therapeutic intervention in order to improve the following deficits and impairments:  Abnormal gait, Decreased balance, Decreased activity tolerance, Decreased coordination, Decreased endurance, Decreased mobility, Decreased range of motion, Decreased strength, Difficulty walking, Impaired sensation, Impaired UE functional use, Postural dysfunction  Visit Diagnosis: Muscle weakness (generalized) - Plan: PT plan of care cert/re-cert  Other abnormalities of gait and mobility - Plan: PT plan of care cert/re-cert  Difficulty in walking, not elsewhere classified - Plan: PT plan of care cert/re-cert  Unsteadiness on feet - Plan: PT plan of care cert/re-cert  History of falling - Plan: PT plan of care cert/re-cert     Problem List Patient Active Problem List   Diagnosis Date Noted   Pulmonary nodule 01/25/2021   Shortness of breath 01/25/2021   Sepsis ([place]) 12/18/2020   Family history of malignant neoplasm of gastrointestinal tract 12/07/2020   Personal history of colonic polyps 12/07/2020   Secondary hypercoagulable state ([place]) 10/14/2020   Right sided abdominal pain    Abdominal wall pain    Atrial fibrillation ([place]) 08/19/2020   Educated about  COVID-19 virus infection 11/16/2019   Urinary frequency 04/14/2019   Cough 04/14/2019   AKI (acute kidney injury) ([place]) 11/26/2018   Diarrhea 11/25/2018   Dehydration, mild 11/25/2018   Hemiparesis affecting nondominant 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 ([place]) 07/31/2018   Muscle ache 07/31/2018   Prediabetes 07/31/2018   Chronic gout without tophus 07/31/2018   Vision loss 07/31/2018   Malnutrition of moderate degree 02/15/2018   Carotid occlusion, bilateral    Atrial fibrillation with RVR ([place]) 02/13/2018   Positive D dimer 02/13/2018   Back pain 02/13/2018   CVA (cerebral vascular accident) ([place]) 02/13/2018   Paroxysmal atrial fibrillation ([place]) 02/13/2018   Coronary artery disease involving native coronary artery of native heart without angina pectoris 02/13/2018   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[facility]) 02/13/2018   Aortic atherosclerosis ([place]) 02/13/2018   CAD, ARTERY BYPASS GRAFT 05/03/2009   TOBACCO USE, QUIT 05/03/2009   ROTATOR CUFF SYNDROME, LEFT 02/12/2009   Shoulder pain, left 11/03/2008   HAMMER TOE, ACQUIRED 02/11/2008   INSECT BITE, LEG 02/11/2008   Cerebral artery occlusion with cerebral infarction ([place]) 12/05/2007   Dyslipidemia 11/05/2007   UNSPECIFIED DISORDER TEETH&SUPPORTING STRUCTURES 11/05/2007   BICEPS TENDINITIS, RIGHT 04/24/2007   Essential hypertension 04/23/2007   MICROCYTOSIS 04/23/2007    Arliss Journey, PT, DPT  04/15/2021, 7:50 AM  [facility] [address] [place], [place], [address] Phone: [phone]   Fax:  [phone]  Name: Kimberly [name] MRN: [id number] Date of Birth: 08-20-1955

## 2021-04-15 ENCOUNTER — Telehealth: Payer: Self-pay | Admitting: Physical Therapy

## 2021-04-15 NOTE — Addendum Note (Signed)
Addended by: Arliss Journey on: 04/15/2021 07:39 AM   Modules accepted: Orders

## 2021-04-15 NOTE — Addendum Note (Signed)
Addended by: Arliss Journey on: 04/15/2021 07:52 AM   Modules accepted: Orders

## 2021-04-15 NOTE — Telephone Encounter (Signed)
[name], FNP  Gunnar Fusi was evaluated by Physical Therapy on 04/15/21.  The patient would benefit from an OT evaluation due to decr strength/impaired coordination of LUE due to hx of CVAs, and difficulties with ADLs.  If you agree, please place an order in [facility] workque in [facility] or fax the order to [phone]. Thank you, Janann August, PT, DPT 04/15/21 7:55 AM    [facility] [address] [place], [place]  [address] Phone:  [phone] Fax:  [phone]

## 2021-04-19 ENCOUNTER — Telehealth: Payer: Medicaid Other

## 2021-04-21 ENCOUNTER — Other Ambulatory Visit: Payer: Self-pay | Admitting: Nurse Practitioner

## 2021-04-21 DIAGNOSIS — R531 Weakness: Secondary | ICD-10-CM

## 2021-04-21 NOTE — Progress Notes (Signed)
Referral sent for OT

## 2021-04-25 ENCOUNTER — Other Ambulatory Visit: Payer: Self-pay

## 2021-04-25 ENCOUNTER — Ambulatory Visit: Payer: Medicaid Other

## 2021-04-25 DIAGNOSIS — R2681 Unsteadiness on feet: Secondary | ICD-10-CM

## 2021-04-25 DIAGNOSIS — R262 Difficulty in walking, not elsewhere classified: Secondary | ICD-10-CM

## 2021-04-25 DIAGNOSIS — Z9181 History of falling: Secondary | ICD-10-CM

## 2021-04-25 DIAGNOSIS — M6281 Muscle weakness (generalized): Secondary | ICD-10-CM

## 2021-04-25 DIAGNOSIS — R2689 Other abnormalities of gait and mobility: Secondary | ICD-10-CM

## 2021-04-25 NOTE — Therapy (Signed)
[facility] [address] [place], [place], [address] Phone: [phone]   Fax:  [phone]  Physical Therapy Treatment  Patient Details  Name: Kimberly [name] MRN: [id number] Date of Birth: [birth year] Referring Provider (PT): [name], FNP   Encounter Date: 04/25/2021   PT End of Session - 04/25/21 1801     Visit Number 2    Number of Visits 13    Date for PT Re-Evaluation 07/13/21    Authorization Type Traditional Medicaid - awaiting auth    Progress Note Due on Visit 10    PT Start Time 1700    PT Stop Time [id number]    PT Time Calculation (min) 45 min    Equipment Utilized During Treatment Gait belt    Activity Tolerance Patient tolerated treatment well;Patient limited by fatigue             Past Medical History:  Diagnosis Date   Bicipital tenosynovitis    Coronary artery disease 05/2007   [id number]    Fall 02/13/2018   Family history of ischemic heart disease    Gout    Hammer toe    Hip, thigh, leg, and ankle, insect bite, nonvenomous, without mention of infection(916.4)    Hyperlipidemia    Hypertension    Other abnormality of red blood cells    Rotator cuff syndrome of left shoulder    Stroke [facility])     Past Surgical History:  Procedure Laterality Date   CARDIAC CATHETERIZATION     COLONOSCOPY WITH PROPOFOL N/A 01/30/2020   Procedure: COLONOSCOPY WITH PROPOFOL;  Surgeon: Carol Ada, MD;  Location: [facility];  Service: Endoscopy;  Laterality: N/A;   CORONARY ARTERY BYPASS GRAFT     triple   CORONARY ARTERY BYPASS GRAFT  2008   POLYPECTOMY  01/30/2020   Procedure: POLYPECTOMY;  Surgeon: Carol Ada, MD;  Location: [facility];  Service: Endoscopy;;   TUBAL LIGATION      There were no vitals filed for this visit.                      [facility] Adult PT Treatment/Exercise - 04/25/21 0001       Transfers   Transfers Sit to Stand;Stand to Sit    Sit to Stand 4: Min  guard;4: Min assist    Stand to Sit 4: Min guard;4: Min assist    Number of Reps 10 reps    Transfer Cueing WS fwd    Comments required CGA to stabilize      Lumbar Exercises: Supine   Bridge Non-compliant;10 reps;Limitations    Bridge Limitations required tactile cues for form      Knee/Hip Exercises: Seated   Long Arc Quad Strengthening;Both;2 sets;10 reps    Long Arc Quad Limitations TCs for form    Heel Slides Strengthening;Both;2 sets;10 reps;Limitations    Heel Slides Limitations towel and TCs to encourage    Marching Strengthening;Both;2 sets;10 reps;Limitations    Marching Limitations verbal and tactile cues to encourage                 Balance Exercises - 04/25/21 0001       Balance Exercises: Seated   Other Seated Exercises has patient retrieve 2.2# ball from floor 10x per UE to facilitate fwd WS                  PT Short Term Goals - 04/25/21 1805  PT SHORT TERM GOAL #1   Title Pt and pt's family member will be independent with initial HEP in order to build upon functional gains made in therapy. ALL STGS DUE AFTER 3RD VISIT.    Baseline currently dependent; 04/25/21 [id number]    Time 3    Period --   visits   Status New    Target Date 05/13/21      PT SHORT TERM GOAL #2   Title Pt will undergo TUG with LTG written in order to demo decr fall risk.    Baseline not assessed at eval    Time 3   visits   Period Weeks    Status New      PT SHORT TERM GOAL #3   Title Pt will improve gait speed with RW to at least .9 ft/sec in order to demo decr fall risk and improved household mobility.    Baseline .18 ft/sec    Time 3   visits   Period Weeks    Status New      PT SHORT TERM GOAL #4   Title Pt will ambulate at least 52' with RW and min guard in order to demo improved household mobility.    Baseline can ambulate 20-30' with min guard/min A before needing to sit due to fatigue.    Time 3   visits   Period Weeks    Status New                PT Long Term Goals - 04/15/21 0725       PT LONG TERM GOAL #1   Title Pt and pt's family member will be independent with final HEP in order to build upon functional gains made in therapy. ALL LTGS DUE AT 13TH VISIT.    Baseline currently dependent    Time 13   visit   Period --    Status New    Target Date 06/10/21      PT LONG TERM GOAL #2   Title TUG goal to be written as appropriate in order to demo decr fall risk.    Baseline not yet assessed.    Time 13    Period --   visits   Status New      PT LONG TERM GOAL #3   Title Pt will improve gait speed with RW vs. LRAD to at least 1.2 ft/sec in order to demo decr fall risk and improved household mobility.    Baseline .57 ft/sec    Time 13    Period Weeks   visits   Status New      PT LONG TERM GOAL #4   Title Pt will ambulate at least 115' with RW vs. LRAD and supervision/min guard in order to demo improved household mobility.    Baseline 20-30' with RW with min guard/min A    Time 13   visits   Period Weeks    Status New      PT LONG TERM GOAL #5   Title Pt will go up and down 4 steps with LRAD and step to pattern with no handrails with family providing min A in order to safely enter/exit apartment for community outings.    Baseline did not perform at eval, pt does not have handrails at apartment complex, family assist pt descend stairs    Time 13   visits   Period Weeks    Status New  Plan - 04/25/21 1801     Clinical Impression Statement Todays session established a HEP for LE strengthening and balance.  Patient with poor tolerance to supine and difficulty obtaining midline in seated position.  Introduced seated hip and knee strengthening and forward reaching from seated position to floor to facilitate fwwd WS to assist in STS tasks.  Tall mirror used to provide feedback for patient to self establish misline and she was able to correct with VCs    Personal Factors and Comorbidities  Comorbidity 3+;Time since onset of injury/illness/exacerbation;Past/Current Experience;Behavior Pattern;Fitness    Comorbidities paroxysmal A. fib on Eliquis, prior CVA x2 with residual L-sided weakness, ascending aortic aneurysm,essential HTN, hyperlipidemia, chronic anxiety/depression, GERD, tobacco use disorder, physical debility, CABG x3 (2008)    Examination-Activity Limitations Bathing;Hygiene/Grooming;Dressing;Locomotion Level;Stairs;Transfers;Stand    Examination-Participation Restrictions Cleaning;Community Activity;Laundry    Stability/Clinical Decision Making Evolving/Moderate complexity    Rehab Potential Good    PT Frequency 1x / week   1-2x week for 3 weeks, followed by 2x week for 5 weeks   PT Duration 3 weeks   1-2x week for 3 weeks, followed by 2x week for 5 weeks   PT Treatment/Interventions ADLs/Self Care Home Management;DME Instruction;Gait training;Stair training;Therapeutic activities;Functional mobility training;Therapeutic exercise;Balance training;Neuromuscular re-education;Patient/family education;Orthotic Fit/Training;Manual techniques;Passive range of motion;Vestibular    PT Next Visit Plan perform TUG with RW with goal written. gait training with RW vs. LRAD and working on endurance. monitor vitals with activity. **need to request auth after 3 visits - would need to cancel 2nd appt in 2nd week to ask for additional auth (i messed up with this with getting pt scheduled)    PT Home Exercise Plan [id number]    Consulted and Agree with Plan of Care Patient;Family member/caregiver    Family Member Consulted pt's sister and daughter             Patient will benefit from skilled therapeutic intervention in order to improve the following deficits and impairments:  Abnormal gait, Decreased balance, Decreased activity tolerance, Decreased coordination, Decreased endurance, Decreased mobility, Decreased range of motion, Decreased strength, Difficulty walking, Impaired sensation,  Impaired UE functional use, Postural dysfunction  Visit Diagnosis: Muscle weakness (generalized)  Other abnormalities of gait and mobility  Difficulty in walking, not elsewhere classified  Unsteadiness on feet  History of falling     Problem List Patient Active Problem List   Diagnosis Date Noted   Pulmonary nodule 01/25/2021   Shortness of breath 01/25/2021   Sepsis ([place]) 12/18/2020   Family history of malignant neoplasm of gastrointestinal tract 12/07/2020   Personal history of colonic polyps 12/07/2020   Secondary hypercoagulable state ([place]) 10/14/2020   Right sided abdominal pain    Abdominal wall pain    Atrial fibrillation ([place]) 08/19/2020   Educated about COVID-19 virus infection 11/16/2019   Urinary frequency 04/14/2019   Cough 04/14/2019   AKI (acute kidney injury) ([place]) 11/26/2018   Diarrhea 11/25/2018   Dehydration, mild 11/25/2018   Hemiparesis affecting nondominant 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 ([place]) 07/31/2018   Muscle ache 07/31/2018   Prediabetes 07/31/2018   Chronic gout without tophus 07/31/2018   Vision loss 07/31/2018   Malnutrition of moderate degree 02/15/2018   Carotid occlusion, bilateral    Atrial fibrillation with RVR ([place]) 02/13/2018   Positive D dimer 02/13/2018   Back pain 02/13/2018   CVA (cerebral vascular accident) ([place]) 02/13/2018   Paroxysmal atrial fibrillation ([place]) 02/13/2018   Coronary artery disease involving native coronary artery of native  heart without angina pectoris 02/13/2018   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[place]) 02/13/2018   Aortic atherosclerosis ([place]) 02/13/2018   CAD, ARTERY BYPASS GRAFT 05/03/2009   TOBACCO USE, QUIT 05/03/2009   ROTATOR CUFF SYNDROME, LEFT 02/12/2009   Shoulder pain, left 11/03/2008   HAMMER TOE, ACQUIRED 02/11/2008   INSECT BITE, LEG 02/11/2008   Cerebral artery occlusion with cerebral infarction ([place]) 12/05/2007    Dyslipidemia 11/05/2007   UNSPECIFIED DISORDER TEETH&SUPPORTING STRUCTURES 11/05/2007   BICEPS TENDINITIS, RIGHT 04/24/2007   Essential hypertension 04/23/2007   MICROCYTOSIS 04/23/2007    [name], PT 04/25/2021, 6:07 PM  [facility] [address] [place], [place], [address] Phone: [phone]   Fax:  [phone]  Name: Kimberly [name] MRN: [id number] Date of Birth: [birth year]

## 2021-04-25 NOTE — Patient Instructions (Signed)
Access Code: [email or web address] URL: [email or web address] Date: 04/25/2021 Prepared by: Sharlynn Oliphant  Exercises Seated Heel Slide - 2 x daily - 7 x weekly - 2 sets - 10 reps Seated Long Arc Quad - 2 x daily - 7 x weekly - 2 sets - 10 reps Seated March - 2 x daily - 7 x weekly - 2 sets - 10 reps Seated Single Arm Reach Down with Trunk Rotation - 2 x daily - 7 x weekly - 2 sets - 10 reps

## 2021-04-28 ENCOUNTER — Other Ambulatory Visit: Payer: Self-pay

## 2021-04-28 ENCOUNTER — Ambulatory Visit: Payer: Medicaid Other

## 2021-04-28 VITALS — BP 110/70 | HR 115

## 2021-04-28 DIAGNOSIS — M6281 Muscle weakness (generalized): Secondary | ICD-10-CM

## 2021-04-28 DIAGNOSIS — R262 Difficulty in walking, not elsewhere classified: Secondary | ICD-10-CM

## 2021-04-28 DIAGNOSIS — R2689 Other abnormalities of gait and mobility: Secondary | ICD-10-CM

## 2021-04-28 NOTE — Therapy (Signed)
[facility] [address] [place], [place], [address] Phone: [phone]   Fax:  [phone]  Physical Therapy Treatment  Patient Details  Name: Kimberly [name] MRN: [id number] Date of Birth: 1956/02/25 Referring Provider (PT): [name], FNP   Encounter Date: 04/28/2021   PT End of Session - 04/28/21 1729     Visit Number 2    Number of Visits 13    Date for PT Re-Evaluation 07/13/21    Authorization Type Traditional Medicaid - awaiting auth    Progress Note Due on Visit 10    PT Start Time 1700    PT Stop Time 1720    PT Time Calculation (min) 20 min    Equipment Utilized During Treatment Gait belt    Activity Tolerance Patient tolerated treatment well;Patient limited by fatigue             Past Medical History:  Diagnosis Date   Bicipital tenosynovitis    Coronary artery disease 05/2007   [id number]    Fall 02/13/2018   Family history of ischemic heart disease    Gout    Hammer toe    Hip, thigh, leg, and ankle, insect bite, nonvenomous, without mention of infection(916.4)    Hyperlipidemia    Hypertension    Other abnormality of red blood cells    Rotator cuff syndrome of left shoulder    Stroke [facility])     Past Surgical History:  Procedure Laterality Date   CARDIAC CATHETERIZATION     COLONOSCOPY WITH PROPOFOL N/A 01/30/2020   Procedure: COLONOSCOPY WITH PROPOFOL;  Surgeon: Carol Ada, MD;  [facility]: [facility];  Service: Endoscopy;  Laterality: N/A;   CORONARY ARTERY BYPASS GRAFT     triple   CORONARY ARTERY BYPASS GRAFT  2008   POLYPECTOMY  01/30/2020   Procedure: POLYPECTOMY;  Surgeon: Carol Ada, MD;  [facility];  Service: Endoscopy;;   TUBAL LIGATION      Vitals:   04/28/21 1725 04/28/21 1726  BP: 110/70   Pulse: (!) 142 (!) 115     Subjective Assessment - 04/28/21 1705     Subjective no changes to report, no falls med changes or pain.    Patient  is accompained by: Family member   family, Lelan Pons   Pertinent History PMH: paroxysmal A. fib on Eliquis, prior CVA x2 with residual L-sided weakness, ascending aortic aneurysm,essential HTN, hyperlipidemia, chronic anxiety/depression, GERD, tobacco use disorder, physical debility, CABG x3 (2008)    Limitations Standing;Walking;House hold activities    How long can you walk comfortably? fatigued after walking 30-40' back into therapy gym.    Patient Stated Goals wants to stop losing her balance so much and not get SOB.    Currently in Pain? No/denies                               [facility] Adult PT Treatment/Exercise - 04/28/21 0001       Transfers   Transfers Sit to Stand;Stand to Sit    Sit to Stand 4: Min guard;4: Min assist    Stand to Sit 4: Min guard;4: Min assist    Transfer Cueing WS fwd, push from chair/mat    Comments required CGA to stabilize                     PT Education - 04/28/21 1729  Education Details Discussed elevated HR in setting of A-fib    Person(s) Educated Patient;Caregiver(s)    Methods Explanation    Comprehension Verbalized understanding              PT Short Term Goals - 04/25/21 1805       PT SHORT TERM GOAL #1   Title Pt and pt's family member will be independent with initial HEP in order to build upon functional gains made in therapy. ALL STGS DUE AFTER 3RD VISIT.    Baseline currently dependent; 04/25/21 [id number]    Time 3    Period --   visits   Status New    Target Date 05/13/21      PT SHORT TERM GOAL #2   Title Pt will undergo TUG with LTG written in order to demo decr fall risk.    Baseline not assessed at eval    Time 3   visits   Period Weeks    Status New      PT SHORT TERM GOAL #3   Title Pt will improve gait speed with RW to at least .9 ft/sec in order to demo decr fall risk and improved household mobility.    Baseline .72 ft/sec    Time 3   visits   Period Weeks    Status New      PT  SHORT TERM GOAL #4   Title Pt will ambulate at least 69' with RW and min guard in order to demo improved household mobility.    Baseline can ambulate 20-30' with min guard/min A before needing to sit due to fatigue.    Time 3   visits   Period Weeks    Status New               PT Long Term Goals - 04/15/21 0725       PT LONG TERM GOAL #1   Title Pt and pt's family member will be independent with final HEP in order to build upon functional gains made in therapy. ALL LTGS DUE AT 13TH VISIT.    Baseline currently dependent    Time 13   visit   Period --    Status New    Target Date 06/10/21      PT LONG TERM GOAL #2   Title TUG goal to be written as appropriate in order to demo decr fall risk.    Baseline not yet assessed.    Time 13    Period --   visits   Status New      PT LONG TERM GOAL #3   Title Pt will improve gait speed with RW vs. LRAD to at least 1.2 ft/sec in order to demo decr fall risk and improved household mobility.    Baseline .57 ft/sec    Time 13    Period Weeks   visits   Status New      PT LONG TERM GOAL #4   Title Pt will ambulate at least 115' with RW vs. LRAD and supervision/min guard in order to demo improved household mobility.    Baseline 20-30' with RW with min guard/min A    Time 13   visits   Period Weeks    Status New      PT LONG TERM GOAL #5   Title Pt will go up and down 4 steps with LRAD and step to pattern with no handrails with family providing min A in order to safely enter/exit  apartment for community outings.    Baseline did not perform at eval, pt does not have handrails at apartment complex, family assist pt descend stairs    Time 13   visits   Period Weeks    Status New                   Plan - 04/28/21 1730     Clinical Impression Statement Todays session began with transfer and gait training with patient ambulating 26f with RW and CGA before c/o fatigue.  BP measured 110/70 but HR 142 folloowing task and  stabilizing at 115 after 10 min rest.  Some light headedness reported but BP WNL.  Denies HA or vision changes.  Discussed need to rest as well as need to monitor vital during PT in light of A-fib.  No treatment provided today due to elevated HR    Personal Factors and Comorbidities Comorbidity 3+;Time since onset of injury/illness/exacerbation;Past/Current Experience;Behavior Pattern;Fitness    Comorbidities paroxysmal A. fib on Eliquis, prior CVA x2 with residual L-sided weakness, ascending aortic aneurysm,essential HTN, hyperlipidemia, chronic anxiety/depression, GERD, tobacco use disorder, physical debility, CABG x3 (2008)    Examination-Activity Limitations Bathing;Hygiene/Grooming;Dressing;Locomotion Level;Stairs;Transfers;Stand    Examination-Participation Restrictions Cleaning;Community Activity;Laundry    Stability/Clinical Decision Making Evolving/Moderate complexity    Rehab Potential Good    PT Frequency 1x / week   1-2x week for 3 weeks, followed by 2x week for 5 weeks   PT Duration 3 weeks   1-2x week for 3 weeks, followed by 2x week for 5 weeks   PT Treatment/Interventions ADLs/Self Care Home Management;DME Instruction;Gait training;Stair training;Therapeutic activities;Functional mobility training;Therapeutic exercise;Balance training;Neuromuscular re-education;Patient/family education;Orthotic Fit/Training;Manual techniques;Passive range of motion;Vestibular    PT Next Visit Plan TUG when appropriate, work on sitting/standing balance and transfer tasks, check HR electronically and maually    PT Home Exercise Plan [id number]    Consulted and Agree with Plan of Care Patient;Family member/caregiver    Family Member Consulted pt's sister and daughter             Patient will benefit from skilled therapeutic intervention in order to improve the following deficits and impairments:  Abnormal gait, Decreased balance, Decreased activity tolerance, Decreased coordination, Decreased  endurance, Decreased mobility, Decreased range of motion, Decreased strength, Difficulty walking, Impaired sensation, Impaired UE functional use, Postural dysfunction  Visit Diagnosis: Muscle weakness (generalized)  Other abnormalities of gait and mobility  Difficulty in walking, not elsewhere classified     Problem List Patient Active Problem List   Diagnosis Date Noted   Pulmonary nodule 01/25/2021   Shortness of breath 01/25/2021   Sepsis ([place] 12/18/2020   Family history of malignant neoplasm of gastrointestinal tract 12/07/2020   Personal history of colonic polyps 12/07/2020   Secondary hypercoagulable state ([place] 10/14/2020   Right sided abdominal pain    Abdominal wall pain    Atrial fibrillation ([place] 08/19/2020   Educated about COVID-19 virus infection 11/16/2019   Urinary frequency 04/14/2019   Cough 04/14/2019   AKI (acute kidney injury) ([place] 11/26/2018   Diarrhea 11/25/2018   Dehydration, mild 11/25/2018   Hemiparesis affecting nondominant 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 ([place] 07/31/2018   Muscle ache 07/31/2018   Prediabetes 07/31/2018   Chronic gout without tophus 07/31/2018   Vision loss 07/31/2018   Malnutrition of moderate degree 02/15/2018   Carotid occlusion, bilateral    Atrial fibrillation with RVR ([place] 02/13/2018   Positive D dimer 02/13/2018   Back pain 02/13/2018  CVA (cerebral vascular accident) ([place]) 02/13/2018   Paroxysmal atrial fibrillation ([place]) 02/13/2018   Coronary artery disease involving native coronary artery of native heart without angina pectoris 02/13/2018   Apical variant hypertrophic cardiomyopathy ([place]) 02/13/2018   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[place]) 02/13/2018   Aortic atherosclerosis ([place]) 02/13/2018   CAD, ARTERY BYPASS GRAFT 05/03/2009   TOBACCO USE, QUIT 05/03/2009   ROTATOR CUFF SYNDROME, LEFT 02/12/2009   Shoulder pain, left 11/03/2008   HAMMER TOE, ACQUIRED 02/11/2008   INSECT BITE, LEG 02/11/2008    Cerebral artery occlusion with cerebral infarction ([place]) 12/05/2007   Dyslipidemia 11/05/2007   UNSPECIFIED DISORDER TEETH&SUPPORTING STRUCTURES 11/05/2007   BICEPS TENDINITIS, RIGHT 04/24/2007   Essential hypertension 04/23/2007   MICROCYTOSIS 04/23/2007    [name], PT 04/28/2021, 5:36 PM  [facility] [address] [place], [place], [address] Phone: [phone]   Fax:  [phone]  Name: KAZUKO KRUPNICK MRN: [id number] Date of Birth: 01-13-1956

## 2021-05-03 ENCOUNTER — Ambulatory Visit: Payer: Medicaid Other

## 2021-05-03 ENCOUNTER — Other Ambulatory Visit: Payer: Self-pay

## 2021-05-03 VITALS — BP 108/72 | HR 84

## 2021-05-03 DIAGNOSIS — M6281 Muscle weakness (generalized): Secondary | ICD-10-CM

## 2021-05-03 DIAGNOSIS — R2689 Other abnormalities of gait and mobility: Secondary | ICD-10-CM

## 2021-05-03 DIAGNOSIS — R262 Difficulty in walking, not elsewhere classified: Secondary | ICD-10-CM

## 2021-05-03 NOTE — Therapy (Signed)
[facility] [address] [place], [place], [address] Phone: [phone]   Fax:  [phone]  Physical Therapy Treatment  Patient Details  Name: Kimberly [name] MRN: [id number] Date of Birth: September 21, 1955 Referring Provider (PT): [name], FNP   Encounter Date: 05/03/2021   PT End of Session - 05/03/21 1745     Visit Number 3    Number of Visits 13    Date for PT Re-Evaluation 07/13/21    Authorization Type Traditional Medicaid - awaiting auth    Progress Note Due on Visit 10    PT Start Time 1700    PT Stop Time 8882    PT Time Calculation (min) 45 min    Equipment Utilized During Treatment Gait belt    Activity Tolerance Patient tolerated treatment well;Patient limited by fatigue             Past Medical History:  Diagnosis Date   Bicipital tenosynovitis    Coronary artery disease 05/2007   [id number]    Fall 02/13/2018   Family history of ischemic heart disease    Gout    Hammer toe    Hip, thigh, leg, and ankle, insect bite, nonvenomous, without mention of infection(916.4)    Hyperlipidemia    Hypertension    Other abnormality of red blood cells    Rotator cuff syndrome of left shoulder    Stroke [facility])     Past Surgical History:  Procedure Laterality Date   CARDIAC CATHETERIZATION     COLONOSCOPY WITH PROPOFOL N/A 01/30/2020   Procedure: COLONOSCOPY WITH PROPOFOL;  Surgeon: Carol Ada, MD;  Location: [facility];  Service: Endoscopy;  Laterality: N/A;   CORONARY ARTERY BYPASS GRAFT     triple   CORONARY ARTERY BYPASS GRAFT  2008   POLYPECTOMY  01/30/2020   Procedure: POLYPECTOMY;  Surgeon: Carol Ada, MD;  Location: [facility];  Service: Endoscopy;;   TUBAL LIGATION      Vitals:   05/03/21 1705  BP: 108/72  Pulse: 84  SpO2: 100%     Subjective Assessment - 05/03/21 1708     Subjective No changes to report, will f/u with cardiologist in 2 weeks.  Had placed a nicotine patch  on her chest at last session which most likely accounted for tachycardia.    Patient is accompained by: Family member   family, Lelan Pons   Pertinent History PMH: paroxysmal A. fib on Eliquis, prior CVA x2 with residual L-sided weakness, ascending aortic aneurysm,essential HTN, hyperlipidemia, chronic anxiety/depression, GERD, tobacco use disorder, physical debility, CABG x3 (2008)    Limitations Standing;Walking;House hold activities    How long can you walk comfortably? fatigued after walking 30-40' back into therapy gym.    Patient Stated Goals wants to stop losing her balance so much and not get SOB.                               [facility] PT Treatment/Exercise - 05/03/21 0001       Transfers   Transfers Sit to Stand;Stand to Sit    Sit to Stand 4: Min guard;4: Min assist    Stand to Sit 4: Min guard;4: Min Nurse, mental health tactile cues to WS fwd over feet    Comments required CGA to stabilize      Ambulation/Gait   Ambulation/Gait Yes    Ambulation/Gait Assistance 4: Min guard;4: Min assist  Ambulation Distance (Feet) 20 Feet    Assistive device Rolling walker    Gait Pattern Step-to pattern;Step-through pattern;Decreased stance time - left;Decreased step length - right;Decreased hip/knee flexion - right;Decreased hip/knee flexion - left;Decreased dorsiflexion - right;Decreased dorsiflexion - left;Decreased weight shift to left;Right foot flat;Left foot flat;Trunk flexed    Ambulation Surface Level;Indoor    Gait Comments fwd flexed posture, CGA/MinA, 2 trials of 24ft      Manual Therapy   Manual therapy comments 2 min prolonged L hamstring stertch                 Balance Exercises - 05/03/21 0001       Balance Exercises: Seated   Other Seated Exercises Has patient retrieve 2.2# ball from floor 10x per UE to facilitate fwd WS, placed ball midline, medial and lateral fro 10x ea., followed by 10 OH presses.    Other Seated Exercises  Comments Scooting laterally 4 scoots to R, then L, 2 bouts ea. direction                  PT Short Term Goals - 04/25/21 1805       PT SHORT TERM GOAL #1   Title Pt and pt's family member will be independent with initial HEP in order to build upon functional gains made in therapy. ALL STGS DUE AFTER 3RD VISIT.    Baseline currently dependent; 04/25/21 [id number]    Time 3    Period --   visits   Status New    Target Date 05/13/21      PT SHORT TERM GOAL #2   Title Pt will undergo TUG with LTG written in order to demo decr fall risk.    Baseline not assessed at eval    Time 3   visits   Period Weeks    Status New      PT SHORT TERM GOAL #3   Title Pt will improve gait speed with RW to at least .9 ft/sec in order to demo decr fall risk and improved household mobility.    Baseline .21 ft/sec    Time 3   visits   Period Weeks    Status New      PT SHORT TERM GOAL #4   Title Pt will ambulate at least 27' with RW and min guard in order to demo improved household mobility.    Baseline can ambulate 20-30' with min guard/min A before needing to sit due to fatigue.    Time 3   visits   Period Weeks    Status New               PT Long Term Goals - 04/15/21 0725       PT LONG TERM GOAL #1   Title Pt and pt's family member will be independent with final HEP in order to build upon functional gains made in therapy. ALL LTGS DUE AT 13TH VISIT.    Baseline currently dependent    Time 13   visit   Period --    Status New    Target Date 06/10/21      PT LONG TERM GOAL #2   Title TUG goal to be written as appropriate in order to demo decr fall risk.    Baseline not yet assessed.    Time 13    Period --   visits   Status New      PT LONG TERM GOAL #3   Title Pt  will improve gait speed with RW vs. LRAD to at least 1.2 ft/sec in order to demo decr fall risk and improved household mobility.    Baseline .57 ft/sec    Time 13    Period Weeks   visits   Status New       PT LONG TERM GOAL #4   Title Pt will ambulate at least 115' with RW vs. LRAD and supervision/min guard in order to demo improved household mobility.    Baseline 20-30' with RW with min guard/min A    Time 13   visits   Period Weeks    Status New      PT LONG TERM GOAL #5   Title Pt will go up and down 4 steps with LRAD and step to pattern with no handrails with family providing min A in order to safely enter/exit apartment for community outings.    Baseline did not perform at eval, pt does not have handrails at apartment complex, family assist pt descend stairs    Time 13   visits   Period Weeks    Status New                   Plan - 05/03/21 1746     Clinical Impression Statement HR under control this date.  Treatment focused on transfer training, WS in FWD direction, facilitated forward bending, bed mobility to facilitate finding misline, transfer training and gait training.  Still apprehensive about unsupported weightbearing.  Able to ambulate 34ft for 2 trials needing VCs to maitain upright posture and not develop too much distence b/t her and RW    Personal Factors and Comorbidities Comorbidity 3+;Time since onset of injury/illness/exacerbation;Past/Current Experience;Behavior Pattern;Fitness    Comorbidities paroxysmal A. fib on Eliquis, prior CVA x2 with residual L-sided weakness, ascending aortic aneurysm,essential HTN, hyperlipidemia, chronic anxiety/depression, GERD, tobacco use disorder, physical debility, CABG x3 (2008)    Examination-Activity Limitations Bathing;Hygiene/Grooming;Dressing;Locomotion Level;Stairs;Transfers;Stand    Examination-Participation Restrictions Cleaning;Community Activity;Laundry    Stability/Clinical Decision Making Evolving/Moderate complexity    Rehab Potential Good    PT Frequency 1x / week   1-2x week for 3 weeks, followed by 2x week for 5 weeks   PT Duration 3 weeks   1-2x week for 3 weeks, followed by 2x week for 5 weeks   PT  Treatment/Interventions ADLs/Self Care Home Management;DME Instruction;Gait training;Stair training;Therapeutic activities;Functional mobility training;Therapeutic exercise;Balance training;Neuromuscular re-education;Patient/family education;Orthotic Fit/Training;Manual techniques;Passive range of motion;Vestibular    PT Next Visit Plan TUG when appropriate, work on sitting/standing balance and transfer tasks, gait and standing balance    PT Home Exercise Plan [id number]    Consulted and Agree with Plan of Care Patient;Family member/caregiver    Family Member Consulted pt's sister and daughter             Patient will benefit from skilled therapeutic intervention in order to improve the following deficits and impairments:  Abnormal gait, Decreased balance, Decreased activity tolerance, Decreased coordination, Decreased endurance, Decreased mobility, Decreased range of motion, Decreased strength, Difficulty walking, Impaired sensation, Impaired UE functional use, Postural dysfunction  Visit Diagnosis: Muscle weakness (generalized)  Other abnormalities of gait and mobility  Difficulty in walking, not elsewhere classified     Problem List Patient Active Problem List   Diagnosis Date Noted   Pulmonary nodule 01/25/2021   Shortness of breath 01/25/2021   Sepsis ([place]) 12/18/2020   Family history of malignant neoplasm of gastrointestinal tract 12/07/2020   Personal history of colonic polyps 12/07/2020  Secondary hypercoagulable state ([place]) 10/14/2020   Right sided abdominal pain    Abdominal wall pain    Atrial fibrillation ([place]) 08/19/2020   Educated about COVID-19 virus infection 11/16/2019   Urinary frequency 04/14/2019   Cough 04/14/2019   AKI (acute kidney injury) ([place]) 11/26/2018   Diarrhea 11/25/2018   Dehydration, mild 11/25/2018   Hemiparesis affecting nondominant 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 ([place]) 07/31/2018   Muscle ache 07/31/2018   Prediabetes  07/31/2018   Chronic gout without tophus 07/31/2018   Vision loss 07/31/2018   Malnutrition of moderate degree 02/15/2018   Carotid occlusion, bilateral    Atrial fibrillation with RVR (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[place]) 02/13/2018   Aortic atherosclerosis ([place]) 02/13/2018   CAD, ARTERY BYPASS GRAFT 05/03/2009   TOBACCO USE, QUIT 05/03/2009   ROTATOR CUFF SYNDROME, LEFT 02/12/2009   Shoulder pain, left 11/03/2008   HAMMER TOE, ACQUIRED 02/11/2008   INSECT BITE, LEG 02/11/2008   Cerebral artery occlusion with cerebral infarction ([place]) 12/05/2007   Dyslipidemia 11/05/2007   UNSPECIFIED DISORDER TEETH&SUPPORTING STRUCTURES 11/05/2007   BICEPS TENDINITIS, RIGHT 04/24/2007   Essential hypertension 04/23/2007   MICROCYTOSIS 04/23/2007    [name], PT 05/03/2021, 5:50 PM  [facility] [address] [place], [place], [address] Phone: [phone]   Fax:  [phone]  Name: Kimberly [name] MRN: [id number] Date of Birth: 07/22/1956

## 2021-05-04 ENCOUNTER — Ambulatory Visit: Payer: Medicaid Other | Admitting: Physical Therapy

## 2021-05-04 ENCOUNTER — Encounter: Payer: Self-pay | Admitting: Physical Therapy

## 2021-05-04 VITALS — BP 105/66 | HR 81

## 2021-05-04 DIAGNOSIS — R2681 Unsteadiness on feet: Secondary | ICD-10-CM

## 2021-05-04 DIAGNOSIS — M6281 Muscle weakness (generalized): Secondary | ICD-10-CM

## 2021-05-04 DIAGNOSIS — R2689 Other abnormalities of gait and mobility: Secondary | ICD-10-CM

## 2021-05-04 DIAGNOSIS — R262 Difficulty in walking, not elsewhere classified: Secondary | ICD-10-CM

## 2021-05-05 NOTE — Therapy (Signed)
[facility] [address] [place], [place], [address] Phone: [phone]   Fax:  [phone]  Physical Therapy Treatment  Patient Details  Name: Kimberly [name] MRN: [id number] Date of Birth: Sep 08, 1955 Referring Provider (PT): [name], FNP   Encounter Date: 05/04/2021   PT End of Session - 05/05/21 0830     Visit Number 4    Number of Visits 13    Date for PT Re-Evaluation 07/13/21    Authorization Type Traditional Medicaid - 3 visits approved 9/12-9/25/22    Authorization - Visit Number 3    Authorization - Number of Visits 3    Progress Note Due on Visit 10    PT Start Time 6283    PT Stop Time 1656    PT Time Calculation (min) 40 min    Equipment Utilized During Treatment Gait belt    Activity Tolerance Patient limited by fatigue;Patient limited by pain    Behavior During Therapy [facility] for tasks assessed/performed             Past Medical History:  Diagnosis Date   Bicipital tenosynovitis    Coronary artery disease 05/2007   [id number]    Fall 02/13/2018   Family history of ischemic heart disease    Gout    Hammer toe    Hip, thigh, leg, and ankle, insect bite, nonvenomous, without mention of infection(916.4)    Hyperlipidemia    Hypertension    Other abnormality of red blood cells    Rotator cuff syndrome of left shoulder    Stroke [facility])     Past Surgical History:  Procedure Laterality Date   CARDIAC CATHETERIZATION     COLONOSCOPY WITH PROPOFOL N/A 01/30/2020   Procedure: COLONOSCOPY WITH PROPOFOL;  Surgeon: Carol Ada, MD;  Location: [facility];  Service: Endoscopy;  Laterality: N/A;   CORONARY ARTERY BYPASS GRAFT     triple   CORONARY ARTERY BYPASS GRAFT  2008   POLYPECTOMY  01/30/2020   Procedure: POLYPECTOMY;  Surgeon: Carol Ada, MD;  Location: [facility];  Service: Endoscopy;;   TUBAL LIGATION      Vitals:   05/04/21 1622  BP: 105/66  Pulse: 81     Subjective  Assessment - 05/04/21 1618     Subjective No changes since she was last here. Has been working on the exercises.    Patient is accompained by: Family member   family, Lelan Pons   Pertinent History PMH: paroxysmal A. fib on Eliquis, prior CVA x2 with residual L-sided weakness, ascending aortic aneurysm,essential HTN, hyperlipidemia, chronic anxiety/depression, GERD, tobacco use disorder, physical debility, CABG x3 (2008)    Limitations Standing;Walking;House hold activities    How long can you walk comfortably? fatigued after walking 30-40' back into therapy gym.    Patient Stated Goals wants to stop losing her balance so much and not get SOB.    Currently in Pain? Yes    Pain Score 8     Pain Location Leg    Pain Orientation Left    Pain Descriptors / Indicators Aching    Pain Type Acute pain    Aggravating Factors  not sure    Pain Relieving Factors taking tylenol                               [facility] Adult PT Treatment/Exercise - 05/04/21 1641       Transfers   Transfers Sit to  Stand;Stand to Sit;Stand Pivot Transfers    Sit to Stand 4: Min guard;4: Min assist    Sit to Stand Details (indicate cue type and reason) 4 sit <> stands performed throughout session today with cues for proper foot placement, scooting towards edge, verbal/manual cues for incr forward weight shift    Stand to Sit 4: Min guard;4: Clinical cytogeneticist Transfers 4: Min guard    Comments stand pivot with RW: x2 reps from w/c to SciFit, cues for sequencing and for posture      Exercises   Exercises Other Exercises    Other Exercises  seated hamstring stretch (therapist placing pt's leg on lower leg to pt's tolerance) 2 x 30 seconds      Knee/Hip Exercises: Aerobic   Stepper SciFit stepper at gear 1.0 for ROM, strengthening, activity tolerance with BLE only, therapist helping maintain LLE in proper positioning, pt reporting that she "hates this machine" and was adamant about getting off due to  incr LLE pain, only able to perform for 2 minutes      Knee/Hip Exercises: Seated   Long Arc Quad Strengthening;AROM;10 reps;1 set    [employer] Limitations visual cue on how high to kick, cues for isometric hold    Other Seated Knee/Hip Exercises seated heel toe raises 2 x 10 reps, cues for ROM    Marching Strengthening;Both;10 reps;Limitations;1 set    Marching Limitations verbal and visual cues on how high to lift legs    Abd/Adduction Limitations seated ball squeezes x10 reps with 3 second hold                       PT Short Term Goals - 05/05/21 0867       PT SHORT TERM GOAL #1   Title Pt and pt's family member will be independent with initial HEP in order to build upon functional gains made in therapy. ALL STGS DUE AFTER 3RD VISIT.    Baseline pt reports that she has been performing at home. 04/25/21 [id number]    Time 3    Period --   visits   Status Achieved    Target Date 05/13/21      PT SHORT TERM GOAL #2   Title Pt will undergo TUG with LTG written in order to demo decr fall risk.    Baseline have not yet assessed - limited participation in session today due to pain    Time 3   visits   Period Weeks    Status Not Met      PT SHORT TERM GOAL #3   Title Pt will improve gait speed with RW to at least .9 ft/sec in order to demo decr fall risk and improved household mobility.    Baseline .57 ft/sec, unable to assess due to decr participation during therapy today.    Time 3   visits   Period Weeks    Status Deferred      PT SHORT TERM GOAL #4   Title Pt will ambulate at least 60' with RW and min guard in order to demo improved household mobility.    Baseline can ambulate 20-30' with min guard/min A before needing to sit due to fatigue.    Time 3   visits   Period Weeks    Status Deferred            Revised/ongoing STGs:  PT Short Term Goals - 05/05/21 6195  PT SHORT TERM GOAL #1   Title Pt and pt's family member will be independent with  initial HEP in order to build upon functional gains made in therapy. ALL STGS DUE AFTER 7TH VISIT    Baseline pt reports that she has been performing at home, will benefit from ongoing additions/review. 04/25/21 [id number]    Time 7    Period --   visits   Status On-going      PT SHORT TERM GOAL #2   Title Pt will undergo TUG with LTG written in order to demo decr fall risk.    Baseline have not yet assessed - limited participation in session today due to pain    Time 7   visits   Period Weeks    Status On-going      PT SHORT TERM GOAL #3   Title Pt will improve gait speed with RW to at least .8 ft/sec in order to demo decr fall risk and improved household mobility.    Baseline .57 ft/sec, unable to assess due to decr participation during therapy today.    Time 7   visits   Period Weeks    Status Revised      PT SHORT TERM GOAL #4   Title Pt will ambulate at least 2' with RW and min guard in order to demo improved household mobility.    Baseline can ambulate 20-30' with min guard/min A before needing to sit due to fatigue.    Time 7   visits   Period Weeks    Status Revised               PT Long Term Goals - 04/15/21 0725       PT LONG TERM GOAL #1   Title Pt and pt's family member will be independent with final HEP in order to build upon functional gains made in therapy. ALL LTGS DUE AT 13TH VISIT.    Baseline currently dependent    Time 13   visit   Period --    Status New    Target Date 06/10/21      PT LONG TERM GOAL #2   Title TUG goal to be written as appropriate in order to demo decr fall risk.    Baseline not yet assessed.    Time 13    Period --   visits   Status New      PT LONG TERM GOAL #3   Title Pt will improve gait speed with RW vs. LRAD to at least 1.2 ft/sec in order to demo decr fall risk and improved household mobility.    Baseline .57 ft/sec    Time 13    Period Weeks   visits   Status New      PT LONG TERM GOAL #4   Title Pt will ambulate  at least 115' with RW vs. LRAD and supervision/min guard in order to demo improved household mobility.    Baseline 20-30' with RW with min guard/min A    Time 13   visits   Period Weeks    Status New      PT LONG TERM GOAL #5   Title Pt will go up and down 4 steps with LRAD and step to pattern with no handrails with family providing min A in order to safely enter/exit apartment for community outings.    Baseline did not perform at eval, pt does not have handrails at apartment complex, family assist pt descend stairs  Time 13   visits   Period Weeks    Status New            Revised/ongoing LTGs:    PT Long Term Goals - 05/05/21 0949       PT LONG TERM GOAL #1   Title Pt and pt's family member will be independent with final HEP in order to build upon functional gains made in therapy. ALL LTGS DUE AT 13TH VISIT.    Baseline will benefit from ongoing additions/review    Time 13   visit   Status On-going      PT LONG TERM GOAL #2   Title TUG goal to be written as appropriate in order to demo decr fall risk.    Baseline not yet assessed.    Time 13    Period --   visits   Status New      PT LONG TERM GOAL #3   Title Pt will improve gait speed with RW vs. LRAD to at least .9 ft/sec in order to demo decr fall risk and improved household mobility.    Baseline .57 ft/sec    Time 13    Period Weeks   visits   Status Revised      PT LONG TERM GOAL #4   Title Pt will ambulate at least 100' with RW vs. LRAD and min guard in order to demo improved household mobility.    Baseline 20-30' with RW with min guard/min A    Time 13   visits   Period Weeks    Status Revised      PT LONG TERM GOAL #5   Title Pt will go up and down 4 steps with LRAD and step to pattern with no handrails with family providing min A in order to safely enter/exit apartment for community outings.    Baseline did not perform at eval, pt does not have handrails at apartment complex, family assist pt descend  stairs    Time 13   visits   Period Weeks    Status On-going                   Plan - 05/05/21 0849     Clinical Impression Statement Vitals [facility] today for today's session. Treatment limited today due to pt reporting an incr in LLE pain and adamant about not doing some activity- unable to tolerate the SciFit and any standing. Did perform seated strengthening exercises to pt's tolerance today. Unable to assess pt's STGs today due to limited participation and pt reporting too much LLE to stand/walk. Pt does report that she is performing her HEP at home. Will request an additional 2x week for 5 weeks to continue to work on strengthening, ROM, functional transfers, gait, and balance in order to decr fall risk and improve functional mobility. STGs/LTGs updated as appropriate.    Personal Factors and Comorbidities Comorbidity 3+;Time since onset of injury/illness/exacerbation;Past/Current Experience;Behavior Pattern;Fitness    Comorbidities paroxysmal A. fib on Eliquis, prior CVA x2 with residual L-sided weakness, ascending aortic aneurysm,essential HTN, hyperlipidemia, chronic anxiety/depression, GERD, tobacco use disorder, physical debility, CABG x3 (2008)    Examination-Activity Limitations Bathing;Hygiene/Grooming;Dressing;Locomotion Level;Stairs;Transfers;Stand    Examination-Participation Restrictions Cleaning;Community Activity;Laundry    Stability/Clinical Decision Making Evolving/Moderate complexity    Rehab Potential Good    PT Frequency 1x / week   1-2x week for 3 weeks, followed by 2x week for 5 weeks   PT Duration 3 weeks   1-2x week for 3  weeks, followed by 2x week for 5 weeks   PT Treatment/Interventions ADLs/Self Care Home Management;DME Instruction;Gait training;Stair training;Therapeutic activities;Functional mobility training;Therapeutic exercise;Balance training;Neuromuscular re-education;Patient/family education;Orthotic Fit/Training;Manual techniques;Passive range of  motion;Vestibular    PT Next Visit Plan LLE strengthening/ROM, work on sitting/standing balance and transfer tasks, gait and standing balance    PT Home Exercise Plan [id number]    Consulted and Agree with Plan of Care Patient;Family member/caregiver    Family Member Consulted pt's sister and daughter             Patient will benefit from skilled therapeutic intervention in order to improve the following deficits and impairments:  Abnormal gait, Decreased balance, Decreased activity tolerance, Decreased coordination, Decreased endurance, Decreased mobility, Decreased range of motion, Decreased strength, Difficulty walking, Impaired sensation, Impaired UE functional use, Postural dysfunction  Visit Diagnosis: Muscle weakness (generalized)  Other abnormalities of gait and mobility  Difficulty in walking, not elsewhere classified  Unsteadiness on feet     Problem List Patient Active Problem List   Diagnosis Date Noted   Pulmonary nodule 01/25/2021   Shortness of breath 01/25/2021   Sepsis ([place]) 12/18/2020   Family history of malignant neoplasm of gastrointestinal tract 12/07/2020   Personal history of colonic polyps 12/07/2020   Secondary hypercoagulable state ([place]) 10/14/2020   Right sided abdominal pain    Abdominal wall pain    Atrial fibrillation ([place]) 08/19/2020   Educated about COVID-19 virus infection 11/16/2019   Urinary frequency 04/14/2019   Cough 04/14/2019   AKI (acute kidney injury) ([place]) 11/26/2018   Diarrhea 11/25/2018   Dehydration, mild 11/25/2018   Hemiparesis affecting nondominant 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 ([place]) 07/31/2018   Muscle ache 07/31/2018   Prediabetes 07/31/2018   Chronic gout without tophus 07/31/2018   Vision loss 07/31/2018   Malnutrition of moderate degree 02/15/2018   Carotid occlusion, bilateral    Atrial fibrillation with RVR ([place]) 02/13/2018   Positive D dimer 02/13/2018   Back pain 02/13/2018   CVA (cerebral  vascular accident) ([place]) 02/13/2018   Paroxysmal atrial fibrillation ([place]) 02/13/2018   Coronary artery disease involving native coronary artery of native heart without angina pectoris 02/13/2018   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[place]) 02/13/2018   Aortic atherosclerosis ([place]) 02/13/2018   CAD, ARTERY BYPASS GRAFT 05/03/2009   TOBACCO USE, QUIT 05/03/2009   ROTATOR CUFF SYNDROME, LEFT 02/12/2009   Shoulder pain, left 11/03/2008   HAMMER TOE, ACQUIRED 02/11/2008   INSECT BITE, LEG 02/11/2008   Cerebral artery occlusion with cerebral infarction ([place]) 12/05/2007   Dyslipidemia 11/05/2007   UNSPECIFIED DISORDER TEETH&SUPPORTING STRUCTURES 11/05/2007   BICEPS TENDINITIS, RIGHT 04/24/2007   Essential hypertension 04/23/2007   MICROCYTOSIS 04/23/2007    Arliss Journey, PT, DPT  05/05/2021, 9:47 AM  [facility] [address] [place], [place], [address] Phone: [phone]   Fax:  [phone]  Name: Kimberly [name] MRN: [id number] Date of Birth: 26-Jan-1956

## 2021-05-09 ENCOUNTER — Ambulatory Visit: Payer: Medicaid Other

## 2021-05-09 ENCOUNTER — Other Ambulatory Visit: Payer: Self-pay

## 2021-05-09 DIAGNOSIS — R2689 Other abnormalities of gait and mobility: Secondary | ICD-10-CM

## 2021-05-09 DIAGNOSIS — M6281 Muscle weakness (generalized): Secondary | ICD-10-CM

## 2021-05-09 DIAGNOSIS — R262 Difficulty in walking, not elsewhere classified: Secondary | ICD-10-CM

## 2021-05-09 NOTE — Therapy (Signed)
[facility] [address] [place], [place], [address] Phone: [phone]   Fax:  [phone]  Physical Therapy Treatment  Patient Details  Name: Kimberly [name] MRN: [id number] Date of Birth: August 09, 1956 Referring Provider (PT): [name], FNP   Encounter Date: 05/09/2021   PT End of Session - 05/09/21 1836     Visit Number 5    Number of Visits 13    Date for PT Re-Evaluation 07/13/21    Authorization Type Traditional Medicaid - 3 visits approved 9/12-9/25/22    Authorization - Visit Number 3    Authorization - Number of Visits 3    Progress Note Due on Visit 10    PT Start Time 5885    PT Stop Time 1745    PT Time Calculation (min) 30 min    Equipment Utilized During Treatment Gait belt    Activity Tolerance Patient limited by fatigue;Patient limited by pain    Behavior During Therapy [facility] for tasks assessed/performed             Past Medical History:  Diagnosis Date   Bicipital tenosynovitis    Coronary artery disease 05/2007   [id number]    Fall 02/13/2018   Family history of ischemic heart disease    Gout    Hammer toe    Hip, thigh, leg, and ankle, insect bite, nonvenomous, without mention of infection(916.4)    Hyperlipidemia    Hypertension    Other abnormality of red blood cells    Rotator cuff syndrome of left shoulder    Stroke [facility])     Past Surgical History:  Procedure Laterality Date   CARDIAC CATHETERIZATION     COLONOSCOPY WITH PROPOFOL N/A 01/30/2020   Procedure: COLONOSCOPY WITH PROPOFOL;  Surgeon: Carol Ada, MD;  [facility];  Service: Endoscopy;  Laterality: N/A;   CORONARY ARTERY BYPASS GRAFT     triple   CORONARY ARTERY BYPASS GRAFT  2008   POLYPECTOMY  01/30/2020   Procedure: POLYPECTOMY;  Surgeon: Carol Ada, MD;  Location: [facility];  Service: Endoscopy;;   TUBAL LIGATION      There were no vitals filed for this visit.   Subjective Assessment -  05/09/21 1718     Subjective Reports a fall last Wed trying to get to the [facility].  Sustained a LLE injury and reports L foot pain and paresthesia, present before the fall though.    Patient is accompained by: Family member   family, Lelan Pons   Pertinent History PMH: paroxysmal A. fib on Eliquis, prior CVA x2 with residual L-sided weakness, ascending aortic aneurysm,essential HTN, hyperlipidemia, chronic anxiety/depression, GERD, tobacco use disorder, physical debility, CABG x3 (2008)    Limitations Standing;Walking;House hold activities    How long can you walk comfortably? fatigued after walking 30-40' back into therapy gym.    Patient Stated Goals wants to stop losing her balance so much and not get SOB.                [facility] PT Assessment - 05/09/21 0001       Ambulation/Gait   Assistive device Rolling walker    Gait Pattern Step-to pattern;Step-through pattern;Decreased stance time - left;Decreased step length - right;Decreased hip/knee flexion - right;Decreased hip/knee flexion - left;Decreased dorsiflexion - right;Decreased dorsiflexion - left;Decreased weight shift to left;Right foot flat;Left foot flat;Trunk flexed    Ambulation Surface Level;Indoor  [facility] Adult PT Treatment/Exercise - 05/09/21 0001       Transfers   Transfers Sit to Stand;Stand to Sit;Stand Pivot Transfers    Sit to Stand 4: Min guard;4: Min assist    Stand to Sit 4: Min guard;4: Clinical cytogeneticist Transfers 4: Min guard      Ambulation/Gait   Ambulation/Gait Yes    Ambulation/Gait Assistance 4: Min guard;4: Min assist    Ambulation Distance (Feet) 40 Feet    Gait Comments fwd flexed posture, CGA/MinA, 2 trials of 30/43ft                 Balance Exercises - 05/09/21 0001       Balance Exercises: Standing   SLS with Vectors Solid surface;Limitations    SLS with Vectors Limitations Tapping floor pebbles 10x per LE standing in RW    Stepping  Strategy Anterior;UE support;10 reps;Limitations    Stepping Strategy Limitations RW support fwd stepping and marching 10x ea. LE                  PT Short Term Goals - 05/05/21 0948       PT SHORT TERM GOAL #1   Title Pt and pt's family member will be independent with initial HEP in order to build upon functional gains made in therapy. ALL STGS DUE AFTER 7TH VISIT    Baseline pt reports that she has been performing at home, will benefit from ongoing additions/review. 04/25/21 [id number]    Time 7    Period --   visits   Status On-going      PT SHORT TERM GOAL #2   Title Pt will undergo TUG with LTG written in order to demo decr fall risk.    Baseline have not yet assessed - limited participation in session today due to pain    Time 7   visits   Period Weeks    Status On-going      PT SHORT TERM GOAL #3   Title Pt will improve gait speed with RW to at least .8 ft/sec in order to demo decr fall risk and improved household mobility.    Baseline .57 ft/sec, unable to assess due to decr participation during therapy today.    Time 7   visits   Period Weeks    Status Revised      PT SHORT TERM GOAL #4   Title Pt will ambulate at least 67' with RW and min guard in order to demo improved household mobility.    Baseline can ambulate 20-30' with min guard/min A before needing to sit due to fatigue.    Time 7   visits   Period Weeks    Status Revised               PT Long Term Goals - 05/05/21 0949       PT LONG TERM GOAL #1   Title Pt and pt's family member will be independent with final HEP in order to build upon functional gains made in therapy. ALL LTGS DUE AT 13TH VISIT.    Baseline will benefit from ongoing additions/review    Time 13   visit   Status On-going      PT LONG TERM GOAL #2   Title TUG goal to be written as appropriate in order to demo decr fall risk.    Baseline not yet assessed.    Time 13    Period --  visits   Status New      PT LONG TERM  GOAL #3   Title Pt will improve gait speed with RW vs. LRAD to at least .9 ft/sec in order to demo decr fall risk and improved household mobility.    Baseline .57 ft/sec    Time 13    Period Weeks   visits   Status Revised      PT LONG TERM GOAL #4   Title Pt will ambulate at least 100' with RW vs. LRAD and min guard in order to demo improved household mobility.    Baseline 20-30' with RW with min guard/min A    Time 13   visits   Period Weeks    Status Revised      PT LONG TERM GOAL #5   Title Pt will go up and down 4 steps with LRAD and step to pattern with no handrails with family providing min A in order to safely enter/exit apartment for community outings.    Baseline did not perform at eval, pt does not have handrails at apartment complex, family assist pt descend stairs    Time 13   visits   Period Weeks    Status On-going                   Plan - 05/09/21 1837     Clinical Impression Statement (15 min late starting)Todays session focused on gait and transfer training, 2 bouts of ambulation with RW of 30 and 29ft respectively.  STS transfer training focused on WS over feet, standing tasks of stepping and marching. Tendency to place walker too far forward remains, retropulses when standing but can correct with cuing. Patient more motivated towards PT today    Personal Factors and Comorbidities Comorbidity 3+;Time since onset of injury/illness/exacerbation;Past/Current Experience;Behavior Pattern;Fitness    Comorbidities paroxysmal A. fib on Eliquis, prior CVA x2 with residual L-sided weakness, ascending aortic aneurysm,essential HTN, hyperlipidemia, chronic anxiety/depression, GERD, tobacco use disorder, physical debility, CABG x3 (2008)    Examination-Activity Limitations Bathing;Hygiene/Grooming;Dressing;Locomotion Level;Stairs;Transfers;Stand    Examination-Participation Restrictions Cleaning;Community Activity;Laundry    Stability/Clinical Decision Making  Evolving/Moderate complexity    Rehab Potential Good    PT Frequency 1x / week   1-2x week for 3 weeks, followed by 2x week for 5 weeks   PT Duration 3 weeks   1-2x week for 3 weeks, followed by 2x week for 5 weeks   PT Treatment/Interventions ADLs/Self Care Home Management;DME Instruction;Gait training;Stair training;Therapeutic activities;Functional mobility training;Therapeutic exercise;Balance training;Neuromuscular re-education;Patient/family education;Orthotic Fit/Training;Manual techniques;Passive range of motion;Vestibular    PT Next Visit Plan perform TUG when appropriate with goal written. LLE strengthening/ROM, work on sitting/standing balance and transfer tasks, gait and standing balance, stepping    PT Home Exercise Plan [id number]    Consulted and Agree with Plan of Care Patient;Family member/caregiver    Family Member Consulted pt's sister and daughter             Patient will benefit from skilled therapeutic intervention in order to improve the following deficits and impairments:  Abnormal gait, Decreased balance, Decreased activity tolerance, Decreased coordination, Decreased endurance, Decreased mobility, Decreased range of motion, Decreased strength, Difficulty walking, Impaired sensation, Impaired UE functional use, Postural dysfunction  Visit Diagnosis: Muscle weakness (generalized)  Other abnormalities of gait and mobility  Difficulty in walking, not elsewhere classified     Problem List Patient Active Problem List   Diagnosis Date Noted   Pulmonary nodule 01/25/2021   Shortness of  breath 01/25/2021   Sepsis ([place]) 12/18/2020   Family history of malignant neoplasm of gastrointestinal tract 12/07/2020   Personal history of colonic polyps 12/07/2020   Secondary hypercoagulable state ([place]) 10/14/2020   Right sided abdominal pain    Abdominal wall pain    Atrial fibrillation ([place]) 08/19/2020   Educated about COVID-19 virus infection 11/16/2019   Urinary  frequency 04/14/2019   Cough 04/14/2019   AKI (acute kidney injury) ([place]) 11/26/2018   Diarrhea 11/25/2018   Dehydration, mild 11/25/2018   Hemiparesis affecting nondominant 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 ([place]) 07/31/2018   Muscle ache 07/31/2018   Prediabetes 07/31/2018   Chronic gout without tophus 07/31/2018   Vision loss 07/31/2018   Malnutrition of moderate degree 02/15/2018   Carotid occlusion, bilateral    Atrial fibrillation with RVR ([place]) 02/13/2018   Positive D dimer 02/13/2018   Back pain 02/13/2018   CVA (cerebral vascular accident) ([place]) 02/13/2018   Paroxysmal atrial fibrillation ([place]) 02/13/2018   Coronary artery disease involving native coronary artery of native heart without angina pectoris 02/13/2018   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[place]) 02/13/2018   Aortic atherosclerosis ([place]) 02/13/2018   CAD, ARTERY BYPASS GRAFT 05/03/2009   TOBACCO USE, QUIT 05/03/2009   ROTATOR CUFF SYNDROME, LEFT 02/12/2009   Shoulder pain, left 11/03/2008   HAMMER TOE, ACQUIRED 02/11/2008   INSECT BITE, LEG 02/11/2008   Cerebral artery occlusion with cerebral infarction ([place]) 12/05/2007   Dyslipidemia 11/05/2007   UNSPECIFIED DISORDER TEETH&SUPPORTING STRUCTURES 11/05/2007   BICEPS TENDINITIS, RIGHT 04/24/2007   Essential hypertension 04/23/2007   MICROCYTOSIS 04/23/2007    [name], PT 05/09/2021, 6:41 PM  [facility] [address] [place], [place], [address] Phone: [phone]   Fax:  [phone]  Name: Kimberly [name] MRN: [id number] Date of Birth: 1955/09/15

## 2021-05-12 ENCOUNTER — Other Ambulatory Visit: Payer: Self-pay

## 2021-05-12 ENCOUNTER — Ambulatory Visit: Payer: Medicaid Other

## 2021-05-12 DIAGNOSIS — R2689 Other abnormalities of gait and mobility: Secondary | ICD-10-CM

## 2021-05-12 DIAGNOSIS — M6281 Muscle weakness (generalized): Secondary | ICD-10-CM

## 2021-05-12 DIAGNOSIS — R2681 Unsteadiness on feet: Secondary | ICD-10-CM

## 2021-05-12 DIAGNOSIS — R262 Difficulty in walking, not elsewhere classified: Secondary | ICD-10-CM

## 2021-05-12 NOTE — Therapy (Signed)
[facility] [address] [place], [place], [address] Phone: [phone]   Fax:  [phone]  Physical Therapy Treatment  Patient Details  Name: Kimberly [name] MRN: [id number] Date of Birth: [birth year] Referring Provider (PT): [name], FNP   Encounter Date: 05/12/2021   PT End of Session - 05/12/21 1631     Visit Number 6    Number of Visits 13    Date for PT Re-Evaluation 07/13/21    Authorization Type Traditional Medicaid - 3 visits approved 9/12-9/25/22    Authorization - Visit Number 3    Authorization - Number of Visits 3    Progress Note Due on Visit 10    PT Start Time 5916    PT Stop Time 1700    PT Time Calculation (min) 45 min    Equipment Utilized During Treatment Gait belt    Activity Tolerance Patient limited by fatigue;Patient limited by pain    Behavior During Therapy [facility] for tasks assessed/performed             Past Medical History:  Diagnosis Date   Bicipital tenosynovitis    Coronary artery disease 05/2007   [id number]    Fall 02/13/2018   Family history of ischemic heart disease    Gout    Hammer toe    Hip, thigh, leg, and ankle, insect bite, nonvenomous, without mention of infection(916.4)    Hyperlipidemia    Hypertension    Other abnormality of red blood cells    Rotator cuff syndrome of left shoulder    Stroke [facility])     Past Surgical History:  Procedure Laterality Date   CARDIAC CATHETERIZATION     COLONOSCOPY WITH PROPOFOL N/A 01/30/2020   Procedure: COLONOSCOPY WITH PROPOFOL;  Surgeon: Carol Ada, MD;  [facility];  Service: Endoscopy;  Laterality: N/A;   CORONARY ARTERY BYPASS GRAFT     triple   CORONARY ARTERY BYPASS GRAFT  2008   POLYPECTOMY  01/30/2020   Procedure: POLYPECTOMY;  Surgeon: Carol Ada, MD;  [facility];  Service: Endoscopy;;   TUBAL LIGATION      There were no vitals filed for this visit.   Subjective Assessment -  05/12/21 1629     Subjective No falls since last session.   No L hip discomdfort to report.    Patient is accompained by: Family member   family, Lelan Pons   Pertinent History PMH: paroxysmal A. fib on Eliquis, prior CVA x2 with residual L-sided weakness, ascending aortic aneurysm,essential HTN, hyperlipidemia, chronic anxiety/depression, GERD, tobacco use disorder, physical debility, CABG x3 (2008)    Limitations Standing;Walking;House hold activities    How long can you walk comfortably? fatigued after walking 30-40' back into therapy gym.    Patient Stated Goals wants to stop losing her balance so much and not get SOB.                               [facility] Adult PT Treatment/Exercise - 05/12/21 0001       Transfers   Transfers Sit to Stand;Stand to Sit;Stand Pivot Transfers    Sit to Stand 4: Min guard;4: Min assist    Stand to Sit 4: Min guard;4: Min Social research officer, government Transfers 4: Min guard    Comments 5x with TCs for proper weight      Ambulation/Gait   Ambulation/Gait Yes    Ambulation/Gait Assistance 4:  Min guard;4: Charity fundraiser (Feet) 115 Feet    Assistive device Rolling walker    Gait Pattern Step-to pattern;Step-through pattern;Decreased stance time - left;Decreased step length - right;Decreased hip/knee flexion - right;Decreased hip/knee flexion - left;Decreased dorsiflexion - right;Decreased dorsiflexion - left;Decreased weight shift to left;Right foot flat;Left foot flat;Trunk flexed    Ambulation Surface Level;Indoor    Gait Comments fwd flexed posture, CGA/MinA, 2 trials of 30/33ft      Knee/Hip Exercises: Seated   Heel Slides AROM;Both;2 sets;15 reps                 Balance Exercises - 05/12/21 0001       Balance Exercises: Standing   SLS with Vectors Solid surface;Limitations    SLS with Vectors Limitations Tapping floor pebbles 10x per LE standing in RW    Other Standing Exercises unsupported standing in walker  for 30s      Balance Exercises: Seated   Other Seated Exercises Has patient retrieve 3.3# ball from floor 10x to facilitate fwd WS, placed ball midline, fatigued quickly                  PT Short Term Goals - 05/05/21 0948       PT SHORT TERM GOAL #1   Title Pt and pt's family member will be independent with initial HEP in order to build upon functional gains made in therapy. ALL STGS DUE AFTER 7TH VISIT    Baseline pt reports that she has been performing at home, will benefit from ongoing additions/review. 04/25/21 [id number]    Time 7    Period --   visits   Status On-going      PT SHORT TERM GOAL #2   Title Pt will undergo TUG with LTG written in order to demo decr fall risk.    Baseline have not yet assessed - limited participation in session today due to pain    Time 7   visits   Period Weeks    Status On-going      PT SHORT TERM GOAL #3   Title Pt will improve gait speed with RW to at least .8 ft/sec in order to demo decr fall risk and improved household mobility.    Baseline .57 ft/sec, unable to assess due to decr participation during therapy today.    Time 7   visits   Period Weeks    Status Revised      PT SHORT TERM GOAL #4   Title Pt will ambulate at least 1' with RW and min guard in order to demo improved household mobility.    Baseline can ambulate 20-30' with min guard/min A before needing to sit due to fatigue.    Time 7   visits   Period Weeks    Status Revised               PT Long Term Goals - 05/05/21 0949       PT LONG TERM GOAL #1   Title Pt and pt's family member will be independent with final HEP in order to build upon functional gains made in therapy. ALL LTGS DUE AT 13TH VISIT.    Baseline will benefit from ongoing additions/review    Time 13   visit   Status On-going      PT LONG TERM GOAL #2   Title TUG goal to be written as appropriate in order to demo decr fall risk.    Baseline  not yet assessed.    Time 13    Period --    visits   Status New      PT LONG TERM GOAL #3   Title Pt will improve gait speed with RW vs. LRAD to at least .9 ft/sec in order to demo decr fall risk and improved household mobility.    Baseline .57 ft/sec    Time 13    Period Weeks   visits   Status Revised      PT LONG TERM GOAL #4   Title Pt will ambulate at least 100' with RW vs. LRAD and min guard in order to demo improved household mobility.    Baseline 20-30' with RW with min guard/min A    Time 13   visits   Period Weeks    Status Revised      PT LONG TERM GOAL #5   Title Pt will go up and down 4 steps with LRAD and step to pattern with no handrails with family providing min A in order to safely enter/exit apartment for community outings.    Baseline did not perform at eval, pt does not have handrails at apartment complex, family assist pt descend stairs    Time 13   visits   Period Weeks    Status On-going                   Plan - 05/12/21 1649     Clinical Impression Statement Able to advance ambulation distance to 154ft with 2 brief rest periods as well as TCs to facilitate L swing phase.  Continues to require encouagement to perform tasks.  Difficulty in WS FWD as well as maintaining midline in sitting.  TCs to correct posture when ambulating as well as keeping AD appropriate distance from her due in part to L side ataxia.  Encouraged proper WS and form with STS transfers    Personal Factors and Comorbidities Comorbidity 3+;Time since onset of injury/illness/exacerbation;Past/Current Experience;Behavior Pattern;Fitness    Comorbidities paroxysmal A. fib on Eliquis, prior CVA x2 with residual L-sided weakness, ascending aortic aneurysm,essential HTN, hyperlipidemia, chronic anxiety/depression, GERD, tobacco use disorder, physical debility, CABG x3 (2008)    Examination-Activity Limitations Bathing;Hygiene/Grooming;Dressing;Locomotion Level;Stairs;Transfers;Stand    Examination-Participation Restrictions  Cleaning;Community Activity;Laundry    Stability/Clinical Decision Making Evolving/Moderate complexity    Rehab Potential Good    PT Frequency 1x / week   1-2x week for 3 weeks, followed by 2x week for 5 weeks   PT Duration 3 weeks   1-2x week for 3 weeks, followed by 2x week for 5 weeks   PT Treatment/Interventions ADLs/Self Care Home Management;DME Instruction;Gait training;Stair training;Therapeutic activities;Functional mobility training;Therapeutic exercise;Balance training;Neuromuscular re-education;Patient/family education;Orthotic Fit/Training;Manual techniques;Passive range of motion;Vestibular    PT Next Visit Plan LLE strengthening/ROM, work on sitting/standing balance and transfer tasks, gait and standing balance, stepping and floor target    PT Home Exercise Plan [id number]    Consulted and Agree with Plan of Care Patient;Family member/caregiver    Family Member Consulted pt's sister and daughter             Patient will benefit from skilled therapeutic intervention in order to improve the following deficits and impairments:  Abnormal gait, Decreased balance, Decreased activity tolerance, Decreased coordination, Decreased endurance, Decreased mobility, Decreased range of motion, Decreased strength, Difficulty walking, Impaired sensation, Impaired UE functional use, Postural dysfunction  Visit Diagnosis: Muscle weakness (generalized)  Other abnormalities of gait and mobility  Difficulty in walking, not elsewhere  classified  Unsteadiness on feet     Problem List Patient Active Problem List   Diagnosis Date Noted   Pulmonary nodule 01/25/2021   Shortness of breath 01/25/2021   Sepsis ([place]) 12/18/2020   Family history of malignant neoplasm of gastrointestinal tract 12/07/2020   Personal history of colonic polyps 12/07/2020   Secondary hypercoagulable state ([place]) 10/14/2020   Right sided abdominal pain    Abdominal wall pain    Atrial fibrillation ([place]) 08/19/2020    Educated about COVID-19 virus infection 11/16/2019   Urinary frequency 04/14/2019   Cough 04/14/2019   AKI (acute kidney injury) ([place]) 11/26/2018   Diarrhea 11/25/2018   Dehydration, mild 11/25/2018   Hemiparesis affecting nondominant 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 ([place]) 07/31/2018   Muscle ache 07/31/2018   Prediabetes 07/31/2018   Chronic gout without tophus 07/31/2018   Vision loss 07/31/2018   Malnutrition of moderate degree 02/15/2018   Carotid occlusion, bilateral    Atrial fibrillation with RVR (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[place]) 02/13/2018   Aortic atherosclerosis ([place]) 02/13/2018   CAD, ARTERY BYPASS GRAFT 05/03/2009   TOBACCO USE, QUIT 05/03/2009   ROTATOR CUFF SYNDROME, LEFT 02/12/2009   Shoulder pain, left 11/03/2008   HAMMER TOE, ACQUIRED 02/11/2008   INSECT BITE, LEG 02/11/2008   Cerebral artery occlusion with cerebral infarction ([place]) 12/05/2007   Dyslipidemia 11/05/2007   UNSPECIFIED DISORDER TEETH&SUPPORTING STRUCTURES 11/05/2007   BICEPS TENDINITIS, RIGHT 04/24/2007   Essential hypertension 04/23/2007   MICROCYTOSIS 04/23/2007    [name], PT 05/12/2021, 5:02 PM  [facility] [address] [place], [place], [address] Phone: [phone]   Fax:  [phone]  Name: Kimberly [name] MRN: [id number] Date of Birth: 1956/04/26

## 2021-05-16 ENCOUNTER — Other Ambulatory Visit: Payer: Self-pay

## 2021-05-16 ENCOUNTER — Ambulatory Visit: Payer: Medicaid Other | Attending: Nurse Practitioner

## 2021-05-16 DIAGNOSIS — R2681 Unsteadiness on feet: Secondary | ICD-10-CM | POA: Insufficient documentation

## 2021-05-16 DIAGNOSIS — R262 Difficulty in walking, not elsewhere classified: Secondary | ICD-10-CM | POA: Diagnosis present

## 2021-05-16 DIAGNOSIS — M6281 Muscle weakness (generalized): Secondary | ICD-10-CM | POA: Diagnosis present

## 2021-05-16 DIAGNOSIS — R2689 Other abnormalities of gait and mobility: Secondary | ICD-10-CM | POA: Insufficient documentation

## 2021-05-16 NOTE — Therapy (Signed)
Kimberly [name] [address] [place], [place], [address] Phone: [phone]   Fax:  [phone]  Physical Therapy Treatment  Patient Details  Name: Kimberly [name] MRN: [id number] Date of Birth: May 22, 1956 Referring Provider (PT): [name], FNP   Encounter Date: 05/16/2021   PT End of Session - 05/16/21 1621     Visit Number 7    Number of Visits 13    Date for PT Re-Evaluation 07/13/21    Authorization Type Traditional Medicaid - 3 visits approved 9/12-9/25/22    Authorization - Visit Number 3    Authorization - Number of Visits 3    Progress Note Due on Visit 10    PT Start Time 6962    PT Stop Time 1700    PT Time Calculation (min) 45 min    Equipment Utilized During Treatment Gait belt    Activity Tolerance Patient limited by fatigue;Patient limited by pain    Behavior During Therapy [facility] for tasks assessed/performed             Past Medical History:  Diagnosis Date   Bicipital tenosynovitis    Coronary artery disease 05/2007   [id number]    Fall 02/13/2018   Family history of ischemic heart disease    Gout    Hammer toe    Hip, thigh, leg, and ankle, insect bite, nonvenomous, without mention of infection(916.4)    Hyperlipidemia    Hypertension    Other abnormality of red blood cells    Rotator cuff syndrome of left shoulder    Stroke [facility])     Past Surgical History:  Procedure Laterality Date   CARDIAC CATHETERIZATION     COLONOSCOPY WITH PROPOFOL N/A 01/30/2020   Procedure: COLONOSCOPY WITH PROPOFOL;  Surgeon: Carol Ada, MD;  Location: [facility];  Service: Endoscopy;  Laterality: N/A;   CORONARY ARTERY BYPASS GRAFT     triple   CORONARY ARTERY BYPASS GRAFT  2008   POLYPECTOMY  01/30/2020   Procedure: POLYPECTOMY;  Surgeon: Carol Ada, MD;  [facility];  Service: Endoscopy;;   TUBAL LIGATION      There were no vitals filed for this visit.   Subjective Assessment -  05/16/21 1620     Subjective Continued reports of L side UE/LE tingling    Patient is accompained by: Family member   family, Kimberly [name]   Pertinent History PMH: paroxysmal A. fib on Eliquis, prior CVA x2 with residual L-sided weakness, ascending aortic aneurysm,essential HTN, hyperlipidemia, chronic anxiety/depression, GERD, tobacco use disorder, physical debility, CABG x3 (2008)    Limitations Standing;Walking;House hold activities    How long can you walk comfortably? fatigued after walking 30-40' back into therapy gym.    Patient Stated Goals wants to stop losing her balance so much and not get SOB.                               [facility] Adult PT Treatment/Exercise - 05/16/21 0001       Transfers   Transfers Sit to Stand;Stand to Sit;Stand Pivot Transfers    Sit to Stand 5: Supervision;4: Min guard    Stand to Sit 4: Min guard    Stand Pivot Transfers 4: Min guard;4: Min assist      Ambulation/Gait   Ambulation/Gait Yes    Ambulation/Gait Assistance 4: Min guard;4: Min Engineer, drilling (Feet) 115 Feet    Assistive device  Rolling walker    Gait Pattern Step-to pattern;Step-through pattern;Decreased stance time - left;Decreased step length - right;Decreased hip/knee flexion - right;Decreased hip/knee flexion - left;Decreased dorsiflexion - right;Decreased dorsiflexion - left;Decreased weight shift to left;Right foot flat;Left foot flat;Trunk flexed    Ambulation Surface Level;Indoor    Gait Comments fwd flexed posture, CGA/MinA, 2 trials of 140ft      Knee/Hip Exercises: Supine   Other Supine Knee/Hip Exercises L hip fallouts 15x      Manual Therapy   Manual Therapy Soft tissue mobilization    Manual therapy comments 8 min L piriformis release as tolerated                       PT Short Term Goals - 05/16/21 1700       PT SHORT TERM GOAL #1   Title Pt and pt's family member will be independent with initial HEP in order to build upon  functional gains made in therapy. ALL STGS DUE AFTER 7TH VISIT    Baseline pt reports that she has been performing at home, will benefit from ongoing additions/review. 04/25/21 [id number]    Time 7    Period --   visits   Status Achieved      PT SHORT TERM GOAL #2   Title Pt will undergo TUG with LTG written in order to demo decr fall risk.    Baseline have not yet assessed - limited participation in session today due to pain    Time 7   visits   Period Weeks    Status On-going      PT SHORT TERM GOAL #3   Title Pt will improve gait speed with RW to at least .8 ft/sec in order to demo decr fall risk and improved household mobility.    Baseline .57 ft/sec, unable to assess due to decr participation during therapy today.    Time 7   visits   Period Weeks    Status Revised      PT SHORT TERM GOAL #4   Title Pt will ambulate at least 16' with RW and min guard in order to demo improved household mobility.    Baseline can ambulate 20-30' with min guard/min A before needing to sit due to fatigue.05/16/21 153ft x2 with RW and CGA/MinA    Time 7   visits   Period Weeks    Status Achieved               PT Long Term Goals - 05/05/21 0949       PT LONG TERM GOAL #1   Title Pt and pt's family member will be independent with final HEP in order to build upon functional gains made in therapy. ALL LTGS DUE AT 13TH VISIT.    Baseline will benefit from ongoing additions/review    Time 13   visit   Status On-going      PT LONG TERM GOAL #2   Title TUG goal to be written as appropriate in order to demo decr fall risk.    Baseline not yet assessed.    Time 13    Period --   visits   Status New      PT LONG TERM GOAL #3   Title Pt will improve gait speed with RW vs. LRAD to at least .9 ft/sec in order to demo decr fall risk and improved household mobility.    Baseline .57 ft/sec    Time 13  Period Weeks   visits   Status Revised      PT LONG TERM GOAL #4   Title Pt will ambulate at  least 100' with RW vs. LRAD and min guard in order to demo improved household mobility.    Baseline 20-30' with RW with min guard/min A    Time 13   visits   Period Weeks    Status Revised      PT LONG TERM GOAL #5   Title Pt will go up and down 4 steps with LRAD and step to pattern with no handrails with family providing min A in order to safely enter/exit apartment for community outings.    Baseline did not perform at eval, pt does not have handrails at apartment complex, family assist pt descend stairs    Time 13   visits   Period Weeks    Status On-going                   Plan - 05/16/21 1635     Clinical Impression Statement Continued to focus on ambulation, transfers and balance tasks.  Able to ambulate around gym with RW and CGA.MinA with cues needed to [facility] appropriate distance in walker.  Attempted supine strenthening but patient reprting pain in L gluteal region due to suspected piriformis spasm, attempted STM to tolerance,  pain prevented patient from performing any supie tasks and she required assist to sit back up.  Able to ambulate for2 bouts of 110+ ft today.  LLE ataxia limits ability to place foot in correct position and shortend step length.    Personal Factors and Comorbidities Comorbidity 3+;Time since onset of injury/illness/exacerbation;Past/Current Experience;Behavior Pattern;Fitness    Comorbidities paroxysmal A. fib on Eliquis, prior CVA x2 with residual L-sided weakness, ascending aortic aneurysm,essential HTN, hyperlipidemia, chronic anxiety/depression, GERD, tobacco use disorder, physical debility, CABG x3 (2008)    Examination-Activity Limitations Bathing;Hygiene/Grooming;Dressing;Locomotion Level;Stairs;Transfers;Stand    Examination-Participation Restrictions Cleaning;Community Activity;Laundry    Stability/Clinical Decision Making Evolving/Moderate complexity    Rehab Potential Good    PT Frequency 1x / week   1-2x week for 3 weeks, followed by 2x  week for 5 weeks   PT Duration 3 weeks   1-2x week for 3 weeks, followed by 2x week for 5 weeks   PT Treatment/Interventions ADLs/Self Care Home Management;DME Instruction;Gait training;Stair training;Therapeutic activities;Functional mobility training;Therapeutic exercise;Balance training;Neuromuscular re-education;Patient/family education;Orthotic Fit/Training;Manual techniques;Passive range of motion;Vestibular    PT Next Visit Plan sitting/standing balance and transfer tasks, gait and standing balance, stepping and floor target, monitor L hip pain    PT Home Exercise Plan [id number]    Consulted and Agree with Plan of Care Patient;Family member/caregiver    Family Member Consulted pt's sister and daughter             Patient will benefit from skilled therapeutic intervention in order to improve the following deficits and impairments:  Abnormal gait, Decreased balance, Decreased activity tolerance, Decreased coordination, Decreased endurance, Decreased mobility, Decreased range of motion, Decreased strength, Difficulty walking, Impaired sensation, Impaired UE functional use, Postural dysfunction  Visit Diagnosis: Muscle weakness (generalized)  Other abnormalities of gait and mobility  Difficulty in walking, not elsewhere classified  Unsteadiness on feet     Problem List Patient Active Problem List   Diagnosis Date Noted   Pulmonary nodule 01/25/2021   Shortness of breath 01/25/2021   Sepsis ([place]) 12/18/2020   Family history of malignant neoplasm of gastrointestinal tract 12/07/2020   Personal history of colonic polyps  12/07/2020   Secondary hypercoagulable state ([place]) 10/14/2020   Right sided abdominal pain    Abdominal wall pain    Atrial fibrillation ([place]) 08/19/2020   Educated about COVID-19 virus infection 11/16/2019   Urinary frequency 04/14/2019   Cough 04/14/2019   AKI (acute kidney injury) ([place]) 11/26/2018   Diarrhea 11/25/2018   Dehydration, mild 11/25/2018    Hemiparesis affecting nondominant 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 ([place]) 07/31/2018   Muscle ache 07/31/2018   Prediabetes 07/31/2018   Chronic gout without tophus 07/31/2018   Vision loss 07/31/2018   Malnutrition of moderate degree 02/15/2018   Carotid occlusion, bilateral    Atrial fibrillation with RVR ([place]) 02/13/2018   Positive D dimer 02/13/2018   Back pain 02/13/2018   CVA (cerebral vascular accident) ([place]) 02/13/2018   Paroxysmal atrial fibrillation ([place]) 02/13/2018   Coronary artery disease involving native coronary artery of native heart without angina pectoris 02/13/2018   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[place]) 02/13/2018   Aortic atherosclerosis ([place]) 02/13/2018   CAD, ARTERY BYPASS GRAFT 05/03/2009   TOBACCO USE, QUIT 05/03/2009   ROTATOR CUFF SYNDROME, LEFT 02/12/2009   Shoulder pain, left 11/03/2008   HAMMER TOE, ACQUIRED 02/11/2008   INSECT BITE, LEG 02/11/2008   Cerebral artery occlusion with cerebral infarction ([place]) 12/05/2007   Dyslipidemia 11/05/2007   UNSPECIFIED DISORDER TEETH&SUPPORTING STRUCTURES 11/05/2007   BICEPS TENDINITIS, RIGHT 04/24/2007   Essential hypertension 04/23/2007   MICROCYTOSIS 04/23/2007    [name], PT 05/16/2021, 5:07 PM  [facility] [address] [place], [place], [address] Phone: [phone]   Fax:  [phone]  Name: MARISSIA BLACKHAM MRN: [id number] Date of Birth: [birth year]

## 2021-05-19 ENCOUNTER — Ambulatory Visit: Payer: Medicaid Other

## 2021-05-23 ENCOUNTER — Other Ambulatory Visit: Payer: Self-pay

## 2021-05-23 ENCOUNTER — Ambulatory Visit: Payer: Medicaid Other

## 2021-05-23 DIAGNOSIS — R2681 Unsteadiness on feet: Secondary | ICD-10-CM

## 2021-05-23 DIAGNOSIS — M6281 Muscle weakness (generalized): Secondary | ICD-10-CM

## 2021-05-23 DIAGNOSIS — R262 Difficulty in walking, not elsewhere classified: Secondary | ICD-10-CM

## 2021-05-23 DIAGNOSIS — R2689 Other abnormalities of gait and mobility: Secondary | ICD-10-CM

## 2021-05-23 NOTE — Therapy (Signed)
[facility] [address] [place], [place], [address] Phone: [phone]   Fax:  [phone]  Physical Therapy Treatment  Patient Details  Name: Kimberly [name] MRN: [id number] Date of Birth: Mar 14, 1956 Referring Provider (PT): [name], FNP   Encounter Date: 05/23/2021   PT End of Session - 05/23/21 1624     Visit Number 8    Number of Visits 13    Date for PT Re-Evaluation 07/13/21    Authorization Type Traditional Medicaid - 3 visits approved 9/12-9/25/22    Authorization - Visit Number 3    Authorization - Number of Visits 3    Progress Note Due on Visit 10    PT Start Time 1620    PT Stop Time 1700    PT Time Calculation (min) 40 min    Equipment Utilized During Treatment Gait belt    Activity Tolerance Patient limited by fatigue;Patient limited by pain    Behavior During Therapy [facility] for tasks assessed/performed             Past Medical History:  Diagnosis Date   Bicipital tenosynovitis    Coronary artery disease 05/2007   [id number]    Fall 02/13/2018   Family history of ischemic heart disease    Gout    Hammer toe    Hip, thigh, leg, and ankle, insect bite, nonvenomous, without mention of infection(916.4)    Hyperlipidemia    Hypertension    Other abnormality of red blood cells    Rotator cuff syndrome of left shoulder    Stroke [facility])     Past Surgical History:  Procedure Laterality Date   CARDIAC CATHETERIZATION     COLONOSCOPY WITH PROPOFOL N/A 01/30/2020   Procedure: COLONOSCOPY WITH PROPOFOL;  Surgeon: Carol Ada, MD;  [facility];  Service: Endoscopy;  Laterality: N/A;   CORONARY ARTERY BYPASS GRAFT     triple   CORONARY ARTERY BYPASS GRAFT  2008   POLYPECTOMY  01/30/2020   Procedure: POLYPECTOMY;  Surgeon: Carol Ada, MD;  [facility];  Service: Endoscopy;;   TUBAL LIGATION      There were no vitals filed for this visit.   Subjective Assessment -  05/23/21 1623     Subjective No new c/o    Patient is accompained by: Family member   family, [name]   Pertinent History PMH: paroxysmal A. fib on Eliquis, prior CVA x2 with residual L-sided weakness, ascending aortic aneurysm,essential HTN, hyperlipidemia, chronic anxiety/depression, GERD, tobacco use disorder, physical debility, CABG x3 (2008)    Limitations Standing;Walking;House hold activities    How long can you walk comfortably? fatigued after walking 30-40' back into therapy gym.    Patient Stated Goals wants to stop losing her balance so much and not get SOB.                [facility] PT Assessment - 05/23/21 0001       Timed Up and Go Test   Normal TUG (seconds) 61.4                           [facility] Adult PT Treatment/Exercise - 05/23/21 0001       Transfers   Transfers Sit to Stand;Stand to Sit;Stand Pivot Transfers    Sit to Stand 5: Supervision;4: Min guard    Stand to Sit 4: Min guard    Stand Pivot Transfers 4: Min guard;4: Min assist  Ambulation/Gait   Ambulation/Gait Yes    Ambulation/Gait Assistance 4: Min guard    Ambulation Distance (Feet) 10 Feet    Assistive device Rolling walker    Gait Pattern Step-to pattern;Step-through pattern;Decreased stance time - left;Decreased step length - right;Decreased hip/knee flexion - right;Decreased hip/knee flexion - left;Decreased dorsiflexion - right;Decreased dorsiflexion - left;Decreased weight shift to left;Right foot flat;Left foot flat;Trunk flexed    Ambulation Surface Level;Indoor    Gait Comments practiced walking 56ft to/from bathroom 6 times, sitting after 73ft                 Balance Exercises - 05/23/21 0001       Balance Exercises: Standing   Stepping Strategy Anterior;UE support;10 reps;Limitations    Stepping Strategy Limitations RW support fwd stepping 10x ea. LE    Step Ups Forward;4 inch;UE support 2;Limitations    Step Ups Limitations 10x per LE                   PT Short Term Goals - 05/23/21 1640       PT SHORT TERM GOAL #1   Title Pt and pt's family member will be independent with initial HEP in order to build upon functional gains made in therapy. ALL STGS DUE AFTER 7TH VISIT    Baseline pt reports that she has been performing at home, will benefit from ongoing additions/review. 04/25/21 [id number]    Time 7    Period --   visits   Status Achieved      PT SHORT TERM GOAL #2   Title Pt will undergo TUG with LTG written in order to demo decr fall risk.    Baseline have not yet assessed - limited participation in session today due to pain; 05/23/21 Initial TUG score 61.4s    Time 7   visits   Period Weeks    Status Achieved      PT SHORT TERM GOAL #3   Title Pt will improve gait speed with RW to at least .8 ft/sec in order to demo decr fall risk and improved household mobility.    Baseline .57 ft/sec, unable to assess due to decr participation during therapy today.    Time 7   visits   Period Weeks    Status Revised      PT SHORT TERM GOAL #4   Title Pt will ambulate at least 40' with RW and min guard in order to demo improved household mobility.    Baseline can ambulate 20-30' with min guard/min A before needing to sit due to fatigue.05/16/21 175ft x2 with RW and CGA/MinA    Time 7   visits   Period Weeks    Status Achieved               PT Long Term Goals - 05/23/21 1641       PT LONG TERM GOAL #1   Title Pt and pt's family member will be independent with final HEP in order to build upon functional gains made in therapy. ALL LTGS DUE AT 13TH VISIT.    Baseline will benefit from ongoing additions/review    Time 13   visit   Status On-going      PT LONG TERM GOAL #2   Title TUG goal to be written as appropriate in order to demo decr fall risk.; 05/23/21 TUG goal is <50s    Baseline not yet assessed.; 10/10/2 61.4s    Time 13    Period --  visits   Status On-going      PT LONG TERM GOAL #3   Title Pt will  improve gait speed with RW vs. LRAD to at least .9 ft/sec in order to demo decr fall risk and improved household mobility.    Baseline .57 ft/sec    Time 13    Period Weeks   visits   Status Revised      PT LONG TERM GOAL #4   Title Pt will ambulate at least 100' with RW vs. LRAD and min guard in order to demo improved household mobility.    Baseline 20-30' with RW with min guard/min A    Time 13   visits   Period Weeks    Status Revised      PT LONG TERM GOAL #5   Title Pt will go up and down 4 steps with LRAD and step to pattern with no handrails with family providing min A in order to safely enter/exit apartment for community outings.    Baseline did not perform at eval, pt does not have handrails at apartment complex, family assist pt descend stairs    Time 13   visits   Period Weeks    Status On-going                   Plan - 05/23/21 1630     Clinical Impression Statement Focus today was gait and transfer training to bathroom at home which is around a 10 ft walk.  Patient contiues to need S for safety concerns as well as cuing for scooting to edge of seat and using arms appropriately.  Patient showing little carry over with transfer skills, needing verbal and tactile cues for arm placement, and scooting.  LLE movements remain ataxic but L foot placement becoming more accurate    Personal Factors and Comorbidities Comorbidity 3+;Time since onset of injury/illness/exacerbation;Past/Current Experience;Behavior Pattern;Fitness    Comorbidities paroxysmal A. fib on Eliquis, prior CVA x2 with residual L-sided weakness, ascending aortic aneurysm,essential HTN, hyperlipidemia, chronic anxiety/depression, GERD, tobacco use disorder, physical debility, CABG x3 (2008)    Examination-Activity Limitations Bathing;Hygiene/Grooming;Dressing;Locomotion Level;Stairs;Transfers;Stand    Examination-Participation Restrictions Cleaning;Community Activity;Laundry    Stability/Clinical Decision  Making Evolving/Moderate complexity    Rehab Potential Good    PT Frequency 1x / week   1-2x week for 3 weeks, followed by 2x week for 5 weeks   PT Duration 3 weeks   1-2x week for 3 weeks, followed by 2x week for 5 weeks   PT Treatment/Interventions ADLs/Self Care Home Management;DME Instruction;Gait training;Stair training;Therapeutic activities;Functional mobility training;Therapeutic exercise;Balance training;Neuromuscular re-education;Patient/family education;Orthotic Fit/Training;Manual techniques;Passive range of motion;Vestibular    PT Next Visit Plan sitting/standing balance and transfer tasks, gait and standing balance, stepping strategies for accuracy    PT Home Exercise Plan [id number]    Consulted and Agree with Plan of Care Patient;Family member/caregiver    Family Member Consulted pt's sister and daughter             Patient will benefit from skilled therapeutic intervention in order to improve the following deficits and impairments:  Abnormal gait, Decreased balance, Decreased activity tolerance, Decreased coordination, Decreased endurance, Decreased mobility, Decreased range of motion, Decreased strength, Difficulty walking, Impaired sensation, Impaired UE functional use, Postural dysfunction  Visit Diagnosis: Muscle weakness (generalized)  Other abnormalities of gait and mobility  Difficulty in walking, not elsewhere classified  Unsteadiness on feet     Problem List Patient Active Problem List   Diagnosis Date Noted  Pulmonary nodule 01/25/2021   Shortness of breath 01/25/2021   Sepsis ([place]) 12/18/2020   Family history of malignant neoplasm of gastrointestinal tract 12/07/2020   Personal history of colonic polyps 12/07/2020   Secondary hypercoagulable state ([place]) 10/14/2020   Right sided abdominal pain    Abdominal wall pain    Atrial fibrillation ([place]) 08/19/2020   Educated about COVID-19 virus infection 11/16/2019   Urinary frequency 04/14/2019   Cough  04/14/2019   AKI (acute kidney injury) ([place]) 11/26/2018   Diarrhea 11/25/2018   Dehydration, mild 11/25/2018   Hemiparesis affecting nondominant 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 ([place]) 07/31/2018   Muscle ache 07/31/2018   Prediabetes 07/31/2018   Chronic gout without tophus 07/31/2018   Vision loss 07/31/2018   Malnutrition of moderate degree 02/15/2018   Carotid occlusion, bilateral    Atrial fibrillation with RVR ([place]) 02/13/2018   Positive D dimer 02/13/2018   Back pain 02/13/2018   CVA (cerebral vascular accident) ([place]) 02/13/2018   Paroxysmal atrial fibrillation ([place]) 02/13/2018   Coronary artery disease involving native coronary artery of native heart without angina pectoris 02/13/2018   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[place]) 02/13/2018   Aortic atherosclerosis ([place]) 02/13/2018   CAD, ARTERY BYPASS GRAFT 05/03/2009   TOBACCO USE, QUIT 05/03/2009   ROTATOR CUFF SYNDROME, LEFT 02/12/2009   Shoulder pain, left 11/03/2008   HAMMER TOE, ACQUIRED 02/11/2008   INSECT BITE, LEG 02/11/2008   Cerebral artery occlusion with cerebral infarction ([place]) 12/05/2007   Dyslipidemia 11/05/2007   UNSPECIFIED DISORDER TEETH&SUPPORTING STRUCTURES 11/05/2007   BICEPS TENDINITIS, RIGHT 04/24/2007   Essential hypertension 04/23/2007   MICROCYTOSIS 04/23/2007    [name], PT 05/23/2021, 5:01 PM  [facility] [address] [place], [place], [address] Phone: [phone]   Fax:  [phone]  Name: Kimberly [name] MRN: [id number] Date of Birth: 1956/07/28

## 2021-05-24 ENCOUNTER — Telehealth: Payer: Self-pay

## 2021-05-24 ENCOUNTER — Other Ambulatory Visit: Payer: Self-pay | Admitting: Cardiology

## 2021-05-24 NOTE — Chronic Care Management (AMB) (Signed)
   Kimberly [name] sister was reminded to have all medications, supplements and any blood glucose and blood pressure readings available for review with Kimberly [name], Pharm. D, at her telephone visit on 05-25-2021 at 12:00.   Questions: Have you had any recent office visit or specialist visit outside of [place]? Patients's sister stated no  Are there any concerns you would like to discuss during your office visit? Patient's sister stated nothing that she can think of besides patient restarting smoking cigarettes.  Are you having any problems obtaining your medications? (Whether it pharmacy issues or cost) Patient's sister stated no  If patient has any PAP medications ask if they are having any problems getting their PAP medication or refill? No PAP medications.  Care Gaps: Covid booster overdue Mammogram overdue PAP smear overdue Flu vaccine overdue Shingrix overdue Tdap overdue  Star Rating Drug: Atorvastatin 80 mg- Last filled 03-14-2021 30 [facility] (Patient's sister states she wasn't around medications to verify but she thinks medication was discontinued but will find out today)  Any gaps in medications fill history? Yes  [place] Pharmacist Assistant [phone]

## 2021-05-25 ENCOUNTER — Ambulatory Visit: Payer: Medicaid Other

## 2021-05-25 NOTE — Progress Notes (Signed)
Chronic Care Management Pharmacy Note  05/31/2021 Name:  Kimberly [name] MRN:  [id number] DOB:  Jun 02, 1956  Summary: Kimberly [name] reports that her sister started smoking again. Her family thinks that a neighbor purches cigarettes for her and she is smoking three cigarettes per day.   Recommendations/Changes made from today's visit: Recommend patient be started on smoking cessation program   Plan: Kimberly [name] is going to talk to her sister about quitting smoking.    Subjective: Kimberly [name] is an 65 y.o. year old female who is a primary patient of [name], [place].  The CCM team was consulted for assistance with disease management and care coordination needs.    Engaged with patient by telephone for follow up visit in response to provider referral for pharmacy case management and/or care coordination services.  Kimberly [name] reports that her sister is doing a lot better. She is going to physical therapy twice per week. She is also eating more.   Consent to Services:  The patient was given information about Chronic Care Management services, agreed to services, and gave verbal consent prior to initiation of services.  Please see initial visit note for detailed documentation.   Patient Care Team: [name], [place] as PCP - General ([place]) [name], MD as PCP - Cardiology (Cardiology) Verl Blalock, Marijo Conception, MD (Inactive) (Cardiology) Rex Kras, Claudette Stapler, RN as Case Manager Pieter Partridge, DO as Consulting Physician (Neurology) Mayford Knife, [facility] (Pharmacist)  Recent office visits: 03/16/2021 PCP OV   Recent consult visits: 03/09/2021 Podiatry OV  01/25/2021 Pulmonology OV 01/14/2021 Cardiology [facility] visits: None in previous 6 months   Objective:  Lab Results  Component Value Date   CREATININE 0.87 01/04/2021   BUN 23 01/04/2021   GFRNONAA 57 (L) 12/21/2020   GFRAA >60 04/27/2020   NA 140 01/04/2021   K 4.4 01/04/2021   CALCIUM 9.4 01/04/2021   CO2  18 (L) 01/04/2021   GLUCOSE 99 01/04/2021    Lab Results  Component Value Date/Time   HGBA1C 5.5 09/02/2019 04:51 PM   HGBA1C 5.9 (H) 07/24/2018 11:58 AM    Last diabetic Eye exam: No results found for: HMDIABEYEEXA  Last diabetic Foot exam: No results found for: HMDIABFOOTEX   Lab Results  Component Value Date   CHOL 114 09/02/2019   HDL 47 09/02/2019   LDLCALC 54 09/02/2019   TRIG 58 09/02/2019   CHOLHDL 2.4 09/02/2019    Hepatic Function Latest Ref Rng & Units 01/04/2021 12/21/2020 12/20/2020  Total Protein 6.0 - 8.5 g/dL 7.0 5.3(L) 5.5(L)  Albumin 3.8 - 4.8 g/dL 4.5 2.6(L) 2.8(L)  AST 0 - 40 IU/L _0 ALT 0 - 32 IU/L 63(H) 33 36  Alk Phosphatase 44 - 121 IU/L 131(H) 90 93  Total Bilirubin 0.0 - 1.2 mg/dL 0.4 0.6 0.4  Bilirubin, Direct 0.0 - 0.3 mg/dL - - -    Lab Results  Component Value Date/Time   TSH 0.897 08/19/2020 09:11 AM   TSH 1.550 12/02/2019 02:23 PM   TSH 1.245 02/13/2018 11:25 AM   TSH 1.466 11/24/2008 09:25 PM    CBC Latest Ref Rng & Units 01/04/2021 12/21/2020 12/20/2020  WBC 3.4 - 10.8 x10E3/uL 7.8 11.4(H) 7.5  Hemoglobin 11.1 - 15.9 g/dL 12.6 10.0(L) 10.0(L)  Hematocrit 34.0 - 46.6 % 40.9 31.7(L) 31.7(L)  Platelets 150 - 450 x10E3/uL 307 187 179    No results found for: VD25OH  Clinical ASCVD: Yes  The ASCVD Risk  score (Arnett DK, et al., 2019) failed to calculate for the following reasons:   The patient has a prior MI or stroke diagnosis    Depression screen [facility] 2/9 03/30/2020 12/02/2019 12/02/2019  Decreased Interest 1 0 0  Down, Depressed, Hopeless 1 1 0  PHQ - 2 Score 2 1 0  Altered sleeping 3 0 -  Tired, decreased energy 3 1 -  Change in appetite 3 1 -  Feeling bad or failure about yourself  1 1 -  Trouble concentrating 1 1 -  Moving slowly or fidgety/restless 3 1 -  Suicidal thoughts 0 0 -  PHQ-9 Score 16 6 -  Difficult doing work/chores Somewhat difficult Somewhat difficult -  Some recent data might be hidden     Social History    Tobacco Use  Smoking Status Former   Packs/day: 2.00   Years: 35.00   Pack years: 70.00   Types: Cigarettes   Quit date: 08/12/2019   Years since quitting: 1.8  Smokeless Tobacco Former   Quit date: 08/09/2008  Tobacco Comments   Stopped smoking in May 2022 ARJ    BP Readings from Last 3 Encounters:  05/04/21 105/66  05/03/21 108/72  04/28/21 110/70   Pulse Readings from Last 3 Encounters:  05/04/21 81  05/03/21 84  04/28/21 (!) 115   Wt Readings from Last 3 Encounters:  02/10/21 118 lb 12.8 oz (53.9 kg)  01/25/21 122 lb 6.4 oz (55.5 kg)  12/18/20 130 lb (59 kg)   BMI Readings from Last 3 Encounters:  02/10/21 18.61 kg/m  01/25/21 19.17 kg/m  12/18/20 20.36 kg/m    Assessment/Interventions: Review of patient past medical history, allergies, medications, health status, including review of consultants reports, laboratory and other test data, was performed as part of comprehensive evaluation and provision of chronic care management services.   SDOH:  (Social Determinants of Health) assessments and interventions performed: No  SDOH Screenings   Alcohol Screen: Not on file  Depression (PHQ2-9): Not on file  Financial Resource Strain: Not on file  Food Insecurity: Not on file  Housing: Not on file  Physical Activity: Not on file  Social Connections: Not on file  Stress: Not on file  Tobacco Use: Medium Risk   Smoking Tobacco Use: Former   Smokeless Tobacco Use: Former  Transport planner Needs: Not on file    [place]  Allergies  Allergen Reactions   Clonidine Derivatives Other (See Comments)    Patch causes scars   Spironolactone Rash    Medications Reviewed Today     Reviewed by [name], PT (Physical Therapist) on 05/30/21 at [place] List Status: <None>   Medication Order Taking? Sig Documenting Provider Last Dose Status Informant  acetaminophen (TYLENOL) 325 MG tablet [id number] No Take 2 tablets (650 mg total) by mouth every 6 (six)  hours as needed for moderate pain, mild pain, fever or headache. Thurnell Lose, MD Taking Active   amLODipine (NORVASC) 10 MG tablet [id number]  TAKE 1 TABLET BY MOUTH EVERY DAY [name], FNP  Active   apixaban (ELIQUIS) 5 MG TABS tablet [id number] No Take 1 tablet (5 mg total) by mouth 2 (two) times daily. [name], FNP Taking Active   atorvastatin (LIPITOR) 80 MG tablet [id number]  TAKE 1 TABLET BY MOUTH EVERY DAY [name], FNP  Active   carvedilol (COREG) 6.25 MG tablet [id number]  TAKE 1 TABLET BY MOUTH 2 TIMES DAILY WITH A MEAL [name], FNP  Active  docusate sodium (COLACE) 100 MG capsule [id number] No Take 1 capsule (100 mg total) by mouth 2 (two) times daily as needed for mild constipation. Thurnell Lose, MD Taking Active   doxycycline (VIBRA-TABS) 100 MG tablet [id number] No TAKE 1 TABLET BY MOUTH EVERY TWELVE HOURS [name], FNP Taking Active   FEROSUL 325 (65 Fe) MG tablet [id number]  TAKE 1 TABLET BY MOUTH EVERY DAY WITH Nettie Elm, MD  Active   furosemide (LASIX) 20 MG tablet [id number] No Take 20 mg by mouth daily x 3 days [name], FNP Taking Active   hydrALAZINE (APRESOLINE) 50 MG tablet [id number]  Take 1 tablet (50 mg total) by mouth 3 (three) times daily. [name], MD  Expired 05/11/21 2359   hydrocortisone cream 1 % [id number] No Apply 1 application topically daily as needed for itching. [provider] Taking Active   mirtazapine (REMERON) 15 MG tablet [id number]  TAKE 1 TABLET BY MOUTH AT BEDTIME [name], FNP  Active   Multiple Vitamin (MULTIVITAMIN) capsule [id number] No Take 1 capsule by mouth daily. [provider] Taking Active Family Member  nicotine (NICODERM CQ - DOSED IN MG/24 HOURS) 21 mg/24hr patch [id number]  Place 1 patch (21 mg total) onto the skin daily. [name], FNP  Active   pantoprazole (PROTONIX) 40 MG tablet [id number]  TAKE 1 TABLET BY MOUTH EVERY DAY [name], FNP  Active    predniSONE (DELTASONE) 5 MG tablet [id number] No Label  & dispense according to the schedule below. take 8 Pills PO for 3 days, 6 Pills PO for 3 days, 4 Pills PO for 3 days, 2 Pills PO for 3 days, 1 Pills PO for 3 days, 1/2 Pill  PO for 3 days then STOP. Total 65 pills. Thurnell Lose, MD Taking Active   Tiotropium Bromide-Olodaterol (STIOLTO RESPIMAT) 2.5-2.5 MCG/ACT AERS [id number] No Inhale 2 puffs into the lungs daily. Collene Gobble, MD Taking Active   traMADol Veatrice Bourbon) 50 MG tablet [id number] No Take 1 tablet (50 mg total) by mouth every 6 (six) hours as needed.  Patient taking differently: Take 50 mg by mouth every 6 (six) hours as needed for moderate pain.   [name], FNP Taking Active   Med List Note Elyn Peers, CPhT 12/18/20 2051): Obtained med hx from sister             Patient Active Problem List   Diagnosis Date Noted   Pulmonary nodule 01/25/2021   Shortness of breath 01/25/2021   Sepsis ([place]) 12/18/2020   Family history of malignant neoplasm of gastrointestinal tract 12/07/2020   Personal history of colonic polyps 12/07/2020   Secondary hypercoagulable state ([place]) 10/14/2020   Right sided abdominal pain    Abdominal wall pain    Atrial fibrillation ([place]) 08/19/2020   Educated about COVID-19 virus infection 11/16/2019   Urinary frequency 04/14/2019   Cough 04/14/2019   AKI (acute kidney injury) ([place]) 11/26/2018   Diarrhea 11/25/2018   Dehydration, mild 11/25/2018   Hemiparesis affecting nondominant 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 ([place]) 07/31/2018   Muscle ache 07/31/2018   Prediabetes 07/31/2018   Chronic gout without tophus 07/31/2018   Vision loss 07/31/2018   Malnutrition of moderate degree 02/15/2018   Carotid occlusion, bilateral    Atrial fibrillation with RVR ([place]) 02/13/2018   Positive D dimer 02/13/2018   Back pain 02/13/2018   CVA (cerebral vascular accident) ([place]) 02/13/2018   Paroxysmal atrial fibrillation ([place])  02/13/2018   Coronary  artery disease involving native coronary artery of native heart without angina pectoris 02/13/2018   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[place]) 02/13/2018   Aortic atherosclerosis ([place]) 02/13/2018   CAD, ARTERY BYPASS GRAFT 05/03/2009   TOBACCO USE, QUIT 05/03/2009   ROTATOR CUFF SYNDROME, LEFT 02/12/2009   Shoulder pain, left 11/03/2008   HAMMER TOE, ACQUIRED 02/11/2008   INSECT BITE, LEG 02/11/2008   Cerebral artery occlusion with cerebral infarction ([place]) 12/05/2007   Dyslipidemia 11/05/2007   UNSPECIFIED DISORDER TEETH&SUPPORTING STRUCTURES 11/05/2007   BICEPS TENDINITIS, RIGHT 04/24/2007   Essential hypertension 04/23/2007   MICROCYTOSIS 04/23/2007    Immunization History  Administered Date(s) Administered   Influenza Split 06/24/2016   Influenza,inj,Quad PF,6+ Mos 07/29/2013, 06/17/2017, 05/17/2020   Moderna SARS-COV2 Booster Vaccination 09/30/2020   Moderna Sars-Covid-2 Vaccination 01/23/2020, 02/20/2020   Zoster Recombinat (Shingrix) 03/16/2021    Conditions to be addressed/monitored:  Hypertension and Hyperlipidemia  Care Plan : [place]  Updates made by Mayford Knife, [place] since 05/31/2021 12:00 AM     Problem: HTN, CAD, HLD      Goal: Disease Management   This Visit's Progress: On track  Note:     Current Barriers:  Unable to independently monitor therapeutic efficacy  Pharmacist Clinical Goal(s):  Patient will achieve adherence to monitoring guidelines and medication adherence to achieve therapeutic efficacy through collaboration with PharmD and provider.   Interventions: 1:1 collaboration with [name], FNP regarding development and update of comprehensive plan of care as evidenced by provider attestation and co-signature Inter-disciplinary care team collaboration (see longitudinal plan of care) Comprehensive medication review performed; medication list  updated in electronic medical record  Hypertension (BP goal <130/80) -Controlled -Current treatment: Hydralazine 50 mg tablet three times per day  Amlodipine 10 mg tablet once per day Carvedilol 6.25 mg tablet twice per day with a meal  -Current home readings: 121/89, 110/70, 140/90 -Current dietary habits: patients sister reported that she was gaining weight, but now she has started smoking again  -Current exercise habits: she is going to physical therapy -Denies hypotensive/hypertensive symptoms -Educated on Daily salt intake goal < 2300 mg; Exercise goal of 150 minutes per week; Importance of home blood pressure monitoring; -Counseled to monitor BP at home at least four times per week, document, and provide log at future appointments -Recommended to continue current medication  Hyperlipidemia: (LDL goal < 70) -Controlled -Current treatment: Atorvastatin 80 mg tablet daily -Current dietary patterns: patient is not eating a lot of carbohydrates or fried/fatty foods -Current exercise habits: please see hypertension  -Educated on Cholesterol goals;  Importance of limiting foods high in cholesterol; -Recommended to continue current medication  Patient Goals/Self-Care Activities Patient will:  - take medications as prescribed  Follow Up Plan: The patient has been provided with contact information for the care management team and has been advised to call with any health related questions or concerns.       Medication Assistance: None required.  Patient affirms current coverage meets needs.  Compliance/Adherence/Medication fill history: Care Gaps: TDAP PAP Smear Mammogram COVID-19 Booster Influenza Vaccine Shingrix Vaccine   Star-Rating Drugs: Atorvastatin 80 mg tablet   Patient's preferred pharmacy is:  [facility] [address] [place] [address] Phone: [phone] Fax: [place] [address] [place] [address] Phone: [phone] Fax: [place], [place] [address]  Dr Trish Mage [address] [place] [address] Phone: [phone] Fax: [phone]  [facility] - [place], [place] - [address] [place] [address] Phone: [phone] Fax: [phone]  [id number] - [place], [place] - [address] [place] [place] [address] Phone: [phone] Fax: [phone]  Uses pill box? Yes Pt endorses 80% compliance  We discussed: Benefits of medication synchronization, packaging and delivery as well as enhanced pharmacist oversight with [facility]. Patient decided to: Continue current medication management strategy  Care Plan and Follow Up Patient Decision:  Patient agrees to Care Plan and Follow-up.  Plan: The patient has been provided with contact information for the care management team and has been advised to call with any health related questions or concerns.   [name], PharmD Clinical Pharmacist [facility] [phone]

## 2021-05-26 ENCOUNTER — Other Ambulatory Visit: Payer: Self-pay

## 2021-05-26 ENCOUNTER — Ambulatory Visit: Payer: Medicaid Other

## 2021-05-26 DIAGNOSIS — M6281 Muscle weakness (generalized): Secondary | ICD-10-CM

## 2021-05-26 DIAGNOSIS — R2689 Other abnormalities of gait and mobility: Secondary | ICD-10-CM

## 2021-05-26 DIAGNOSIS — R262 Difficulty in walking, not elsewhere classified: Secondary | ICD-10-CM

## 2021-05-26 DIAGNOSIS — R2681 Unsteadiness on feet: Secondary | ICD-10-CM

## 2021-05-26 NOTE — Therapy (Signed)
[facility] [address] [place], [place], [address] Phone: [phone]   Fax:  [phone]  Physical Therapy Treatment  Patient Details  Name: Kimberly [name] MRN: [id number] Date of Birth: [birth year] Referring Provider (PT): [name], FNP   Encounter Date: 05/26/2021   PT End of Session - 05/26/21 1621     Visit Number 9    Number of Visits 13    Date for PT Re-Evaluation 07/13/21    Authorization Type Traditional Medicaid - 3 visits approved 9/12-9/25/22    Progress Note Due on Visit 10    PT Start Time 1615    PT Stop Time 1700    PT Time Calculation (min) 45 min    Equipment Utilized During Treatment Gait belt    Activity Tolerance Patient limited by fatigue;Patient limited by pain    Behavior During Therapy [facility] for tasks assessed/performed             Past Medical History:  Diagnosis Date   Bicipital tenosynovitis    Coronary artery disease 05/2007   [id number]    Fall 02/13/2018   Family history of ischemic heart disease    Gout    Hammer toe    Hip, thigh, leg, and ankle, insect bite, nonvenomous, without mention of infection(916.4)    Hyperlipidemia    Hypertension    Other abnormality of red blood cells    Rotator cuff syndrome of left shoulder    Stroke [facility])     Past Surgical History:  Procedure Laterality Date   CARDIAC CATHETERIZATION     COLONOSCOPY WITH PROPOFOL N/A 01/30/2020   Procedure: COLONOSCOPY WITH PROPOFOL;  Surgeon: Carol Ada, MD;  Location: [facility];  Service: Endoscopy;  Laterality: N/A;   CORONARY ARTERY BYPASS GRAFT     triple   CORONARY ARTERY BYPASS GRAFT  2008   POLYPECTOMY  01/30/2020   Procedure: POLYPECTOMY;  Surgeon: Carol Ada, MD;  Location: [facility];  Service: Endoscopy;;   TUBAL LIGATION      There were no vitals filed for this visit.                      [place] [facility] Adult PT Treatment/Exercise - 05/26/21 0001        Transfers   Transfers Sit to Stand;Stand to Sit;Stand Pivot Transfers    Sit to Stand 4: Min guard;4: Min assist    Stand to Sit 4: Min guard;4: Clinical cytogeneticist Transfers 4: Min guard    Comments Still not showing safe transfer skills      Ambulation/Gait   Ambulation/Gait Yes    Ambulation/Gait Assistance 4: Min guard    Ambulation Distance (Feet) 115 Feet    Assistive device Rolling walker    Gait Pattern Step-to pattern;Step-through pattern;Decreased stance time - left;Decreased step length - right;Decreased hip/knee flexion - right;Decreased hip/knee flexion - left;Decreased dorsiflexion - right;Decreased dorsiflexion - left;Decreased weight shift to left;Right foot flat;Left foot flat;Trunk flexed    Ambulation Surface Level;Indoor    Gait Comments patient unable to perform STS transfers today w/o Minguard o PT and w/o bracing knees against mat table, unable to WS onto feet with retropulsion evident                 Balance Exercises - 05/26/21 0001       Balance Exercises: Standing   Stepping Strategy Anterior;Lateral;UE support;10 reps;Limitations    Stepping Strategy Limitations verbal and tactile  cuing needed    Step Ups Forward;4 inch;UE support 2;Limitations    Step Ups Limitations 10x per LE in RW with inability to stand upright.      Balance Exercises: Seated   Other Seated Exercises Had patient retrieve 5# ball from floor 5x to facilitate fwd WS, placed midline, fatigued quickly                  PT Short Term Goals - 05/26/21 1622       PT SHORT TERM GOAL #1   Title Pt and pt's family member will be independent with initial HEP in order to build upon functional gains made in therapy. ALL STGS DUE AFTER 7TH VISIT    Baseline pt reports that she has been performing at home, will benefit from ongoing additions/review. 04/25/21 [id number]    Time 7    Period --   visits   Status Achieved      PT SHORT TERM GOAL #2   Title Pt will undergo TUG  with LTG written in order to demo decr fall risk.    Baseline have not yet assessed - limited participation in session today due to pain; 05/23/21 Initial TUG score 61.4s    Time 7   visits   Period Weeks    Status Achieved      PT SHORT TERM GOAL #3   Title Pt will improve gait speed with RW to at least .8 ft/sec in order to demo decr fall risk and improved household mobility.    Baseline .57 ft/sec, unable to assess due to decr participation during therapy today.05/26/21 Gai speed 0.72 ft/s    Time 7   visits   Period Weeks    Status Partially Met      PT SHORT TERM GOAL #4   Title Pt will ambulate at least 85' with RW and min guard in order to demo improved household mobility.    Baseline can ambulate 20-30' with min guard/min A before needing to sit due to fatigue.05/16/21 196f x2 with RW and CGA/MinA    Time 7   visits   Period Weeks    Status Achieved               PT Long Term Goals - 05/26/21 1631       PT LONG TERM GOAL #1   Title Pt and pt's family member will be independent with final HEP in order to build upon functional gains made in therapy. ALL LTGS DUE AT 13TH VISIT.    Baseline will benefit from ongoing additions/review    Time 13   visit   Status On-going      PT LONG TERM GOAL #2   Title TUG goal to be written as appropriate in order to demo decr fall risk.; 05/23/21 TUG goal is <50s    Baseline not yet assessed.; 10/10/2 61.4s    Time 13    Period --   visits   Status On-going      PT LONG TERM GOAL #3   Title Pt will improve gait speed with RW vs. LRAD to at least .9 ft/sec in order to demo decr fall risk and improved household mobility.05/26/21 Gait speed 0.72 ft/s    Baseline .57 ft/sec    Time 13    Period Weeks   visits   Status Revised      PT LONG TERM GOAL #4   Title Pt will ambulate at least 100' with RW vs. LRAD  and min guard in order to demo improved household mobility.    Baseline 20-30' with RW with min guard/min A; 05/26/21  Ambulation of 120f with RW and CGA    Time 13   visits   Period Weeks    Status Revised      PT LONG TERM GOAL #5   Title Pt will go up and down 4 steps with LRAD and step to pattern with no handrails with family providing min A in order to safely enter/exit apartment for community outings.    Baseline did not perform at eval, pt does not have handrails at apartment complex, family assist pt descend stairs    Time 13   visits   Period Weeks    Status On-going                   Plan - 05/26/21 1712     Clinical Impression Statement Todays treatment focus was transfers, ambulation and assessment of progress towards LTG and STGs.  Patient shows gains in ambulation distance, gait speed.  She continues to demo retropulsion with standing and cannot stand unsupported. She is reluctant to correct posture and needs cuing for proper walkrer placement.  Progress slow but is progressing towards goal achievement.    Personal Factors and Comorbidities Comorbidity 3+;Time since onset of injury/illness/exacerbation;Past/Current Experience;Behavior Pattern;Fitness    Comorbidities paroxysmal A. fib on Eliquis, prior CVA x2 with residual L-sided weakness, ascending aortic aneurysm,essential HTN, hyperlipidemia, chronic anxiety/depression, GERD, tobacco use disorder, physical debility, CABG x3 (2008)    Examination-Activity Limitations Bathing;Hygiene/Grooming;Dressing;Locomotion Level;Stairs;Transfers;Stand    Examination-Participation Restrictions Cleaning;Community Activity;Laundry    Stability/Clinical Decision Making Evolving/Moderate complexity    Rehab Potential Good    PT Frequency 1x / week   1-2x week for 3 weeks, followed by 2x week for 5 weeks   PT Duration 3 weeks   1-2x week for 3 weeks, followed by 2x week for 5 weeks   PT Treatment/Interventions ADLs/Self Care Home Management;DME Instruction;Gait training;Stair training;Therapeutic activities;Functional mobility training;Therapeutic  exercise;Balance training;Neuromuscular re-education;Patient/family education;Orthotic Fit/Training;Manual techniques;Passive range of motion;Vestibular    PT Next Visit Plan sitting/standing balance and transfer tasks, gait and standing balance, stepping strategies for accuracy    PT Home Exercise Plan [id number]    Consulted and Agree with Plan of Care Patient;Family member/caregiver    Family Member Consulted pt's sister and daughter             Patient will benefit from skilled therapeutic intervention in order to improve the following deficits and impairments:  Abnormal gait, Decreased balance, Decreased activity tolerance, Decreased coordination, Decreased endurance, Decreased mobility, Decreased range of motion, Decreased strength, Difficulty walking, Impaired sensation, Impaired UE functional use, Postural dysfunction  Visit Diagnosis: Muscle weakness (generalized)  Other abnormalities of gait and mobility  Difficulty in walking, not elsewhere classified  Unsteadiness on feet     Problem List Patient Active Problem List   Diagnosis Date Noted   Pulmonary nodule 01/25/2021   Shortness of breath 01/25/2021   Sepsis ([employer] 12/18/2020   Family history of malignant neoplasm of gastrointestinal tract 12/07/2020   Personal history of colonic polyps 12/07/2020   Secondary hypercoagulable state ([facility] 10/14/2020   Right sided abdominal pain    Abdominal wall pain    Atrial fibrillation ([facility] 08/19/2020   Educated about COVID-19 virus infection 11/16/2019   Urinary frequency 04/14/2019   Cough 04/14/2019   AKI (acute kidney injury) ([facility] 11/26/2018   Diarrhea 11/25/2018   Dehydration, mild 11/25/2018  Hemiparesis affecting nondominant 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 ([employer]) 07/31/2018   Muscle ache 07/31/2018   Prediabetes 07/31/2018   Chronic gout without tophus 07/31/2018   Vision loss 07/31/2018   Malnutrition of moderate degree 02/15/2018   Carotid  occlusion, bilateral    Atrial fibrillation with RVR ([place]) 02/13/2018   Positive D dimer 02/13/2018   Back pain 02/13/2018   CVA (cerebral vascular accident) ([place]) 02/13/2018   Paroxysmal atrial fibrillation ([place]) 02/13/2018   Coronary artery disease involving native coronary artery of native heart without angina pectoris 02/13/2018   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[place]) 02/13/2018   Aortic atherosclerosis ([place]) 02/13/2018   CAD, ARTERY BYPASS GRAFT 05/03/2009   TOBACCO USE, QUIT 05/03/2009   ROTATOR CUFF SYNDROME, LEFT 02/12/2009   Shoulder pain, left 11/03/2008   HAMMER TOE, ACQUIRED 02/11/2008   INSECT BITE, LEG 02/11/2008   Cerebral artery occlusion with cerebral infarction ([place]) 12/05/2007   Dyslipidemia 11/05/2007   UNSPECIFIED DISORDER TEETH&SUPPORTING STRUCTURES 11/05/2007   BICEPS TENDINITIS, RIGHT 04/24/2007   Essential hypertension 04/23/2007   MICROCYTOSIS 04/23/2007    [name], PT 05/26/2021, 5:19 PM  [facility] [address] [place], [place], [address] Phone: [phone]   Fax:  [phone]  Name: Kimberly [name] MRN: [id number] Date of Birth: 03-Mar-1956

## 2021-05-30 ENCOUNTER — Ambulatory Visit: Payer: Medicaid Other

## 2021-05-30 ENCOUNTER — Other Ambulatory Visit: Payer: Self-pay

## 2021-05-30 DIAGNOSIS — M6281 Muscle weakness (generalized): Secondary | ICD-10-CM | POA: Diagnosis not present

## 2021-05-30 DIAGNOSIS — R262 Difficulty in walking, not elsewhere classified: Secondary | ICD-10-CM

## 2021-05-30 DIAGNOSIS — R2689 Other abnormalities of gait and mobility: Secondary | ICD-10-CM

## 2021-05-30 NOTE — Therapy (Signed)
[facility] [address] [place], [place], [address] Phone: [phone]   Fax:  [phone]  Physical Therapy Treatment/Progress Note  Patient Details  Name: Kimberly [name] MRN: [id number] Date of Birth: 04-18-1956 Referring Provider (PT): [name], FNP   Encounter Date: 05/30/2021     Dates of Reporting Period: 04/14/21-05/30/21  See Note below for Objective Data and Assessment of Progress/Goals.     PT End of Session - 05/30/21 1616     Visit Number 10    Number of Visits 13    Date for PT Re-Evaluation 07/13/21    Authorization Type Traditional Medicaid - 3 visits approved 9/12-9/25/22    Progress Note Due on Visit 10    PT Start Time 1615    PT Stop Time 1700    PT Time Calculation (min) 45 min    Equipment Utilized During Treatment Gait belt    Activity Tolerance Patient limited by fatigue;Patient limited by pain    Behavior During Therapy [facility] for tasks assessed/performed             Past Medical History:  Diagnosis Date   Bicipital tenosynovitis    Coronary artery disease 05/2007   [id number]    Fall 02/13/2018   Family history of ischemic heart disease    Gout    Hammer toe    Hip, thigh, leg, and ankle, insect bite, nonvenomous, without mention of infection(916.4)    Hyperlipidemia    Hypertension    Other abnormality of red blood cells    Rotator cuff syndrome of left shoulder    Stroke [facility])     Past Surgical History:  Procedure Laterality Date   CARDIAC CATHETERIZATION     COLONOSCOPY WITH PROPOFOL N/A 01/30/2020   Procedure: COLONOSCOPY WITH PROPOFOL;  Surgeon: Carol Ada, MD;  [facility];  Service: Endoscopy;  Laterality: N/A;   CORONARY ARTERY BYPASS GRAFT     triple   CORONARY ARTERY BYPASS GRAFT  2008   POLYPECTOMY  01/30/2020   Procedure: POLYPECTOMY;  Surgeon: Carol Ada, MD;  [facility];  Service: Endoscopy;;   TUBAL LIGATION       There were no vitals filed for this visit.                      [place] [facility] Adult PT Treatment/Exercise - 05/30/21 0001       Transfers   Transfers Sit to Stand;Stand to Sit;Stand Pivot Transfers    Sit to Stand 4: Min guard    Stand to Sit 4: Min guard;4: Clinical cytogeneticist Transfers 4: Min guard      Ambulation/Gait   Ambulation/Gait Yes    Ambulation/Gait Assistance 4: Min guard    Ambulation Distance (Feet) 250 Feet    Assistive device Rolling walker    Gait Pattern Step-to pattern;Step-through pattern;Decreased stance time - left;Decreased step length - right;Decreased hip/knee flexion - right;Decreased hip/knee flexion - left;Decreased dorsiflexion - right;Decreased dorsiflexion - left;Decreased weight shift to left;Right foot flat;Left foot flat;Trunk flexed    Ambulation Surface Level;Indoor                 Balance Exercises - 05/30/21 0001       Balance Exercises: Standing   Step Ups Forward;Lateral;6 inch;UE support 2;Limitations    Step Ups Limitations 10x a. LE pwerfromed in RW      Balance Exercises: Seated   Other Seated Exercises Had patient retrieve  10# KB from floor 5x to facilitate fwd WS, placed midline, fatigued quickly                  PT Short Term Goals - 05/26/21 1622       PT SHORT TERM GOAL #1   Title Pt and pt's family member will be independent with initial HEP in order to build upon functional gains made in therapy. ALL STGS DUE AFTER 7TH VISIT    Baseline pt reports that she has been performing at home, will benefit from ongoing additions/review. 04/25/21 [id number]    Time 7    Period --   visits   Status Achieved      PT SHORT TERM GOAL #2   Title Pt will undergo TUG with LTG written in order to demo decr fall risk.    Baseline have not yet assessed - limited participation in session today due to pain; 05/23/21 Initial TUG score 61.4s    Time 7   visits   Period Weeks    Status Achieved      PT  SHORT TERM GOAL #3   Title Pt will improve gait speed with RW to at least .8 ft/sec in order to demo decr fall risk and improved household mobility.    Baseline .57 ft/sec, unable to assess due to decr participation during therapy today.05/26/21 Gai speed 0.72 ft/s    Time 7   visits   Period Weeks    Status Partially Met      PT SHORT TERM GOAL #4   Title Pt will ambulate at least 84' with RW and min guard in order to demo improved household mobility.    Baseline can ambulate 20-30' with min guard/min A before needing to sit due to fatigue.05/16/21 11f x2 with RW and CGA/MinA    Time 7   visits   Period Weeks    Status Achieved               PT Long Term Goals - 05/30/21 1620       PT LONG TERM GOAL #1   Title Pt and pt's family member will be independent with final HEP in order to build upon functional gains made in therapy. ALL LTGS DUE AT 13TH VISIT.    Baseline will benefit from ongoing additions/review; 05/30/21 Patient has CG 2 1/2 hrs per day until daughter gets home from work but only walks her to/from bathroom.    Time 13   visit   Status Deferred      PT LONG TERM GOAL #2   Title TUG goal to be written as appropriate in order to demo decr fall risk.; 05/23/21 TUG goal is <50s    Baseline not yet assessed.; 10/10/2 61.4s; 05/30/21 TUG 55.3s with RW, best of 2 trials    Time 13    Period --   visits   Status On-going      PT LONG TERM GOAL #3   Title Pt will improve gait speed with RW vs. LRAD to at least .9 ft/sec in order to demo decr fall risk and improved household mobility.    Baseline .519ft/sec; 05/26/21 Gait speed 0.72 ft/s; 05/30/21 1.14 ft/s    Time 13    Period Weeks   visits   Status Achieved      PT LONG TERM GOAL #4   Title Pt will ambulate at least 100' with RW vs. LRAD and min guard in order to demo improved household  mobility.    Baseline 20-30' with RW with min guard/min A; 05/26/21 Ambulation of 125f with RW and CGA    Time 13   visits    Period Weeks    Status Achieved      PT LONG TERM GOAL #5   Title Pt will go up and down 4 steps with LRAD and step to pattern with no handrails with family providing min A in order to safely enter/exit apartment for community outings.    Baseline did not perform at eval, pt does not have handrails at apartment complex, family assist pt descend stairs; 05/30/21 Able to negotiate 4 step with B rails and step to pattern with CGA    Time 13   visits   Period Weeks    Status Achieved                   Plan - 05/30/21 1655     Clinical Impression Statement Todays session serves as a progress note, goals assessed as needed.  TUG time has improved but goal not yet met, patient able to negotiate 4 steps with CGA, gait velocity goal met and gait distanc also met.  Main limitation to progress is lack of activity at home as patient has limited S for practicing gait and transfers.  Instructed CG to have patient begin cycling at home with equipment    Personal Factors and Comorbidities Comorbidity 3+;Time since onset of injury/illness/exacerbation;Past/Current Experience;Behavior Pattern;Fitness    Comorbidities paroxysmal A. fib on Eliquis, prior CVA x2 with residual L-sided weakness, ascending aortic aneurysm,essential HTN, hyperlipidemia, chronic anxiety/depression, GERD, tobacco use disorder, physical debility, CABG x3 (2008)    Examination-Activity Limitations Bathing;Hygiene/Grooming;Dressing;Locomotion Level;Stairs;Transfers;Stand    Examination-Participation Restrictions Cleaning;Community Activity;Laundry    Stability/Clinical Decision Making Evolving/Moderate complexity    Rehab Potential Good    PT Frequency 1x / week   1-2x week for 3 weeks, followed by 2x week for 5 weeks   PT Duration 3 weeks   1-2x week for 3 weeks, followed by 2x week for 5 weeks   PT Treatment/Interventions ADLs/Self Care Home Management;DME Instruction;Gait training;Stair training;Therapeutic  activities;Functional mobility training;Therapeutic exercise;Balance training;Neuromuscular re-education;Patient/family education;Orthotic Fit/Training;Manual techniques;Passive range of motion;Vestibular    PT Next Visit Plan sitting/standing balance and transfer tasks, gait and standing balance, stepping strategies for accuracy    PT Home Exercise Plan [id number]    Consulted and Agree with Plan of Care Patient;Family member/caregiver    Family Member Consulted pt's sister and daughter             Patient will benefit from skilled therapeutic intervention in order to improve the following deficits and impairments:  Abnormal gait, Decreased balance, Decreased activity tolerance, Decreased coordination, Decreased endurance, Decreased mobility, Decreased range of motion, Decreased strength, Difficulty walking, Impaired sensation, Impaired UE functional use, Postural dysfunction  Visit Diagnosis: Muscle weakness (generalized)  Other abnormalities of gait and mobility  Difficulty in walking, not elsewhere classified     Problem List Patient Active Problem List   Diagnosis Date Noted   Pulmonary nodule 01/25/2021   Shortness of breath 01/25/2021   Sepsis ([facility] 12/18/2020   Family history of malignant neoplasm of gastrointestinal tract 12/07/2020   Personal history of colonic polyps 12/07/2020   Secondary hypercoagulable state ([facility] 10/14/2020   Right sided abdominal pain    Abdominal wall pain    Atrial fibrillation ([facility] 08/19/2020   Educated about COVID-19 virus infection 11/16/2019   Urinary frequency 04/14/2019   Cough 04/14/2019  AKI (acute kidney injury) ([place]) 11/26/2018   Diarrhea 11/25/2018   Dehydration, mild 11/25/2018   Hemiparesis affecting nondominant 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 ([place]) 07/31/2018   Muscle ache 07/31/2018   Prediabetes 07/31/2018   Chronic gout without tophus 07/31/2018   Vision loss 07/31/2018   Malnutrition of moderate degree  02/15/2018   Carotid occlusion, bilateral    Atrial fibrillation with RVR ([place]) 02/13/2018   Positive D dimer 02/13/2018   Back pain 02/13/2018   CVA (cerebral vascular accident) ([place]) 02/13/2018   Paroxysmal atrial fibrillation ([place]) 02/13/2018   Coronary artery disease involving native coronary artery of native heart without angina pectoris 02/13/2018   Apical variant hypertrophic cardiomyopathy ([place]) 02/13/2018   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[place]) 02/13/2018   Aortic atherosclerosis ([place]) 02/13/2018   CAD, ARTERY BYPASS GRAFT 05/03/2009   TOBACCO USE, QUIT 05/03/2009   ROTATOR CUFF SYNDROME, LEFT 02/12/2009   Shoulder pain, left 11/03/2008   HAMMER TOE, ACQUIRED 02/11/2008   INSECT BITE, LEG 02/11/2008   Cerebral artery occlusion with cerebral infarction ([place]) 12/05/2007   Dyslipidemia 11/05/2007   UNSPECIFIED DISORDER TEETH&SUPPORTING STRUCTURES 11/05/2007   BICEPS TENDINITIS, RIGHT 04/24/2007   Essential hypertension 04/23/2007   MICROCYTOSIS 04/23/2007    [name], PT 05/30/2021, 4:59 PM  [facility] [address] [place], [place], [address] Phone: [phone]   Fax:  [phone]  Name: Kimberly [name] MRN: [id number] Date of Birth: 02/20/1956

## 2021-05-31 NOTE — Patient Instructions (Signed)
Visit Information It was great speaking with you today!  Please let me know if you have any questions about our visit.   Goals Addressed             This Visit's Progress    Manage My Medicine       Timeframe:  Long-Range Goal Priority:  High Start Date:                             Expected End Date:                       Follow Up Date 08/24/2021  In Process:  - call for medicine refill 2 or 3 days before it runs out - call if I am sick and can't take my medicine - keep a list of all the medicines I take; vitamins and herbals too - use a pillbox to sort medicine - use an alarm clock or phone to remind me to take my medicine    Why is this important?   These steps will help you keep on track with your medicines.   Notes:         Patient Care Plan: CCM Pharmacy Care Plan     Problem Identified: HTN, HLD      Goal: Disease Management   This Visit's Progress: On track  Note:     Current Barriers:  Unable to independently monitor therapeutic efficacy  Pharmacist Clinical Goal(s):  Patient will achieve adherence to monitoring guidelines and medication adherence to achieve therapeutic efficacy through collaboration with PharmD and provider.   Interventions: 1:1 collaboration with [name], FNP regarding development and update of comprehensive plan of care as evidenced by provider attestation and co-signature Inter-disciplinary care team collaboration (see longitudinal plan of care) Comprehensive medication review performed; medication list updated in electronic medical record  Hypertension (BP goal <130/80) -Controlled -Current treatment: Hydralazine 50 mg tablet three times per day  Amlodipine 10 mg tablet once per day Carvedilol 6.25 mg tablet twice per day with a meal  -Current home readings: 121/89, 110/70, 140/90 -Current dietary habits: patients sister reported that she was gaining weight, but now she has started smoking again  -Current exercise  habits: she is going to physical therapy -Denies hypotensive/hypertensive symptoms -Educated on Daily salt intake goal < 2300 mg; Exercise goal of 150 minutes per week; Importance of home blood pressure monitoring; -Counseled to monitor BP at home at least four times per week, document, and provide log at future appointments -Recommended to continue current medication  Hyperlipidemia: (LDL goal < 70) -Controlled -Current treatment: Atorvastatin 80 mg tablet daily -Current dietary patterns: patient is not eating a lot of carbohydrates or fried/fatty foods -Current exercise habits: please see hypertension  -Educated on Cholesterol goals;  Importance of limiting foods high in cholesterol; -Recommended to continue current medication  Patient Goals/Self-Care Activities Patient will:  - take medications as prescribed  Follow Up Plan: The patient has been provided with contact information for the care management team and has been advised to call with any health related questions or concerns.        Patient agreed to services and verbal consent obtained.   The patient verbalized understanding of instructions, educational materials, and care plan provided today and agreed to receive a mailed copy of patient instructions, educational materials, and care plan.   Kimberly [name], PharmD Clinical Pharmacist [facility] [phone]

## 2021-06-02 ENCOUNTER — Other Ambulatory Visit: Payer: Self-pay

## 2021-06-02 ENCOUNTER — Ambulatory Visit: Payer: Medicaid Other

## 2021-06-02 DIAGNOSIS — R2689 Other abnormalities of gait and mobility: Secondary | ICD-10-CM

## 2021-06-02 DIAGNOSIS — R262 Difficulty in walking, not elsewhere classified: Secondary | ICD-10-CM

## 2021-06-02 DIAGNOSIS — M6281 Muscle weakness (generalized): Secondary | ICD-10-CM

## 2021-06-02 DIAGNOSIS — R2681 Unsteadiness on feet: Secondary | ICD-10-CM

## 2021-06-02 NOTE — Therapy (Addendum)
[facility] [address] [place], [place], [address] Phone: [phone]   Fax:  [phone]  Physical Therapy Treatment/DC Summary  Patient Details  Name: Kimberly [name] MRN: [id number] Date of Birth: Nov 30, 1955 Referring Provider (PT): [name], FNP   Encounter Date: 06/02/2021 PHYSICAL THERAPY DISCHARGE SUMMARY  Visits from Start of Care: 11  Current functional level related to goals / functional outcomes: UTA   Remaining deficits: Balance and mobility   Education / Equipment: HEP   Patient agrees to discharge. Patient goals were partially met. Patient is being discharged due to not returning since the last visit.   PT End of Session - 06/02/21 1621     Visit Number 11    Number of Visits 13    Date for PT Re-Evaluation 07/13/21    Authorization Type Traditional Medicaid - 3 visits approved 9/12-9/25/22    Progress Note Due on Visit 10    PT Start Time 1615    PT Stop Time 1700    PT Time Calculation (min) 45 min    Equipment Utilized During Treatment Gait belt    Activity Tolerance Patient limited by fatigue;Patient limited by pain    Behavior During Therapy [facility] for tasks assessed/performed             Past Medical History:  Diagnosis Date   Bicipital tenosynovitis    Coronary artery disease 05/2007   [id number]    Fall 02/13/2018   Family history of ischemic heart disease    Gout    Hammer toe    Hip, thigh, leg, and ankle, insect bite, nonvenomous, without mention of infection(916.4)    Hyperlipidemia    Hypertension    Other abnormality of red blood cells    Rotator cuff syndrome of left shoulder    Stroke [facility])     Past Surgical History:  Procedure Laterality Date   CARDIAC CATHETERIZATION     COLONOSCOPY WITH PROPOFOL N/A 01/30/2020   Procedure: COLONOSCOPY WITH PROPOFOL;  Surgeon: Carol Ada, MD;  Location: [facility] ENDOSCOPY;  Service: Endoscopy;  Laterality: N/A;   CORONARY  ARTERY BYPASS GRAFT     triple   CORONARY ARTERY BYPASS GRAFT  2008   POLYPECTOMY  01/30/2020   Procedure: POLYPECTOMY;  Surgeon: Carol Ada, MD;  Location: [facility] ENDOSCOPY;  Service: Endoscopy;;   TUBAL LIGATION      There were no vitals filed for this visit.   Subjective Assessment - 06/02/21 1621     Subjective Still noting some L hip discomfort, better overall    Patient is accompained by: Family member   family, [name]   Pertinent History PMH: paroxysmal A. fib on Eliquis, prior CVA x2 with residual L-sided weakness, ascending aortic aneurysm,essential HTN, hyperlipidemia, chronic anxiety/depression, GERD, tobacco use disorder, physical debility, CABG x3 (2008)    Limitations Standing;Walking;House hold activities    How long can you walk comfortably? fatigued after walking 30-40' back into therapy gym.    Patient Stated Goals wants to stop losing her balance so much and not get SOB.                               [place] Adult PT Treatment/Exercise - 06/02/21 0001       Transfers   Transfers Sit to Stand;Stand to [employer] Transfers    Sit to Stand 4: Min guard    Stand to Sit 4: Min guard;4: Min assist  Stand Pivot Transfers 4: Min guard    Comments 5 STS transfer with cuing needed for proper sequence, unable to stand safely w/o CGA of PT      Ambulation/Gait   Ambulation/Gait Yes    Ambulation/Gait Assistance 4: Min guard;5: Supervision    Ambulation Distance (Feet) 115 Feet    Assistive device Rolling walker    Gait Pattern Step-to pattern;Step-through pattern;Decreased stance time - left;Decreased step length - right;Decreased hip/knee flexion - right;Decreased hip/knee flexion - left;Decreased dorsiflexion - right;Decreased dorsiflexion - left;Decreased weight shift to left;Right foot flat;Left foot flat;Trunk flexed    Ambulation Surface Level;Indoor    Stairs Yes    Stairs Assistance 4: Min guard    Stair Management Technique Two rails;Step  to pattern    Number of Stairs 4    Height of Stairs 6    Gait Comments 2 bouts of 124f                 Balance Exercises - 06/02/21 0001       Balance Exercises: Standing   Retro Gait Upper extremity support;5 reps;Limitations    Retro Gait Limitations performed in // bars forward and backward walking                  PT Short Term Goals - 05/26/21 1622       PT SHORT TERM GOAL #1   Title Pt and pt's family member will be independent with initial HEP in order to build upon functional gains made in therapy. ALL STGS DUE AFTER 7TH VISIT    Baseline pt reports that she has been performing at home, will benefit from ongoing additions/review. 04/25/21 [id number]    Time 7    Period --   visits   Status Achieved      PT SHORT TERM GOAL #2   Title Pt will undergo TUG with LTG written in order to demo decr fall risk.    Baseline have not yet assessed - limited participation in session today due to pain; 05/23/21 Initial TUG score 61.4s    Time 7   visits   Period Weeks    Status Achieved      PT SHORT TERM GOAL #3   Title Pt will improve gait speed with RW to at least .8 ft/sec in order to demo decr fall risk and improved household mobility.    Baseline .57 ft/sec, unable to assess due to decr participation during therapy today.05/26/21 Gai speed 0.72 ft/s    Time 7   visits   Period Weeks    Status Partially Met      PT SHORT TERM GOAL #4   Title Pt will ambulate at least 318 with RW and min guard in order to demo improved household mobility.    Baseline can ambulate 20-30' with min guard/min A before needing to sit due to fatigue.05/16/21 [id number] with RW and CGA/MinA    Time 7   visits   Period Weeks    Status Achieved               PT Long Term Goals - 05/30/21 1620       PT LONG TERM GOAL #1   Title Pt and pt's family member will be independent with final HEP in order to build upon functional gains made in therapy. ALL LTGS DUE AT 13TH VISIT.     Baseline will benefit from ongoing additions/review; 05/30/21 Patient has CG 2 1/2 hrs per day  until daughter gets home from work but only walks her to/from bathroom.    Time 13   visit   Status Deferred      PT LONG TERM GOAL #2   Title TUG goal to be written as appropriate in order to demo decr fall risk.; 05/23/21 TUG goal is <50s    Baseline not yet assessed.; 10/10/2 61.4s; 05/30/21 TUG 55.3s with RW, best of 2 trials    Time 13    Period --   visits   Status On-going      PT LONG TERM GOAL #3   Title Pt will improve gait speed with RW vs. LRAD to at least .9 ft/sec in order to demo decr fall risk and improved household mobility.    Baseline .14 ft/sec; 05/26/21 Gait speed 0.72 ft/s; 05/30/21 1.14 ft/s    Time 13    Period Weeks   visits   Status Achieved      PT LONG TERM GOAL #4   Title Pt will ambulate at least 100' with RW vs. LRAD and min guard in order to demo improved household mobility.    Baseline 20-30' with RW with min guard/min A; 05/26/21 Ambulation of 167f with RW and CGA    Time 13   visits   Period Weeks    Status Achieved      PT LONG TERM GOAL #5   Title Pt will go up and down 4 steps with LRAD and step to pattern with no handrails with family providing min A in order to safely enter/exit apartment for community outings.    Baseline did not perform at eval, pt does not have handrails at apartment complex, family assist pt descend stairs; 05/30/21 Able to negotiate 4 step with B rails and step to pattern with CGA    Time 13   visits   Period Weeks    Status Achieved                   Plan - 06/02/21 1643     Clinical Impression Statement Todays session focused on gait training, safe transfers and stair negotiation.  Added retrowalking in // bars.  Continued need of verbal and tactile cues for safe transfer techniques as well as safe stair negotiation. reviewed weight shift strategies when standing from sit as well as proper distance to maitin in RW  as well as from sitting surface to avoid excessive backwards walking    Personal Factors and Comorbidities Comorbidity 3+;Time since onset of injury/illness/exacerbation;Past/Current Experience;Behavior Pattern;Fitness    Comorbidities paroxysmal A. fib on Eliquis, prior CVA x2 with residual L-sided weakness, ascending aortic aneurysm,essential HTN, hyperlipidemia, chronic anxiety/depression, GERD, tobacco use disorder, physical debility, CABG x3 (2008)    Examination-Activity Limitations Bathing;Hygiene/Grooming;Dressing;Locomotion Level;Stairs;Transfers;Stand    Examination-Participation Restrictions Cleaning;Community Activity;Laundry    Stability/Clinical Decision Making Evolving/Moderate complexity    Rehab Potential Good    PT Frequency 1x / week   1-2x week for 3 weeks, followed by 2x week for 5 weeks   PT Duration 3 weeks   1-2x week for 3 weeks, followed by 2x week for 5 weeks   PT Treatment/Interventions ADLs/Self Care Home Management;DME Instruction;Gait training;Stair training;Therapeutic activities;Functional mobility training;Therapeutic exercise;Balance training;Neuromuscular re-education;Patient/family education;Orthotic Fit/Training;Manual techniques;Passive range of motion;Vestibular    PT Next Visit Plan reinforce transfers, ambulation and stair climbing for pending DC in 2 sessions, goals    PT Home Exercise Plan [id number]    Consulted and Agree with Plan of Care Patient;Family member/caregiver  Family Member Consulted pt's sister and daughter             Patient will benefit from skilled therapeutic intervention in order to improve the following deficits and impairments:  Abnormal gait, Decreased balance, Decreased activity tolerance, Decreased coordination, Decreased endurance, Decreased mobility, Decreased range of motion, Decreased strength, Difficulty walking, Impaired sensation, Impaired UE functional use, Postural dysfunction  Visit Diagnosis: Muscle weakness  (generalized)  Other abnormalities of gait and mobility  Difficulty in walking, not elsewhere classified  Unsteadiness on feet     Problem List Patient Active Problem List   Diagnosis Date Noted   Pulmonary nodule 01/25/2021   Shortness of breath 01/25/2021   Sepsis ([place]) 12/18/2020   Family history of malignant neoplasm of gastrointestinal tract 12/07/2020   Personal history of colonic polyps 12/07/2020   Secondary hypercoagulable state ([place]) 10/14/2020   Right sided abdominal pain    Abdominal wall pain    Atrial fibrillation ([place]) 08/19/2020   Educated about COVID-19 virus infection 11/16/2019   Urinary frequency 04/14/2019   Cough 04/14/2019   AKI (acute kidney injury) ([place]) 11/26/2018   Diarrhea 11/25/2018   Dehydration, mild 11/25/2018   Hemiparesis affecting nondominant 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 ([place]) 07/31/2018   Muscle ache 07/31/2018   Prediabetes 07/31/2018   Chronic gout without tophus 07/31/2018   Vision loss 07/31/2018   Malnutrition of moderate degree 02/15/2018   Carotid occlusion, bilateral    Atrial fibrillation with RVR ([place]) 02/13/2018   Positive D dimer 02/13/2018   Back pain 02/13/2018   CVA (cerebral vascular accident) ([place]) 02/13/2018   Paroxysmal atrial fibrillation ([place]) 02/13/2018   Coronary artery disease involving native coronary artery of native heart without angina pectoris 02/13/2018   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[place]) 02/13/2018   Aortic atherosclerosis ([place]) 02/13/2018   CAD, ARTERY BYPASS GRAFT 05/03/2009   TOBACCO USE, QUIT 05/03/2009   ROTATOR CUFF SYNDROME, LEFT 02/12/2009   Shoulder pain, left 11/03/2008   HAMMER TOE, ACQUIRED 02/11/2008   INSECT BITE, LEG 02/11/2008   Cerebral artery occlusion with cerebral infarction ([place]) 12/05/2007   Dyslipidemia 11/05/2007   UNSPECIFIED DISORDER TEETH&SUPPORTING STRUCTURES 11/05/2007   BICEPS TENDINITIS,  RIGHT 04/24/2007   Essential hypertension 04/23/2007   MICROCYTOSIS 04/23/2007    [name], PT 06/02/2021, 5:09 PM  [facility] [address] [place], [place], [address] Phone: [phone]   Fax:  [phone]  Name: Kimberly [name] MRN: [id number] Date of Birth: 10-29-1955

## 2021-07-11 ENCOUNTER — Other Ambulatory Visit: Payer: Self-pay | Admitting: Nurse Practitioner

## 2021-07-11 ENCOUNTER — Telehealth: Payer: Self-pay

## 2021-07-11 DIAGNOSIS — R630 Anorexia: Secondary | ICD-10-CM

## 2021-07-11 NOTE — Chronic Care Management (AMB) (Signed)
Chronic Care Management Pharmacy Assistant   Name: Kimberly [name]  MRN: [id number] DOB: [birth year]  Reason for Encounter: Disease State/ General    Recent office visits:  None  Recent consult visits:  05-26-2021 Kimberly [name], PT (Physical therapy). Physical therapy for walking.  05-30-2021 Kimberly [name], PT (Physical therapy). Physical therapy for walking.  06-02-2021 Kimberly [name], PT (Physical therapy). Physical therapy for walking.  Hospital visits:  None in previous 6 months  Medications: Outpatient Encounter Medications as of 07/11/2021  Medication Sig   acetaminophen (TYLENOL) 325 MG tablet Take 2 tablets (650 mg total) by mouth every 6 (six) hours as needed for moderate pain, mild pain, fever or headache.   amLODipine (NORVASC) 10 MG tablet TAKE 1 TABLET BY MOUTH EVERY DAY   apixaban (ELIQUIS) 5 MG TABS tablet Take 1 tablet (5 mg total) by mouth 2 (two) times daily.   atorvastatin (LIPITOR) 80 MG tablet TAKE 1 TABLET BY MOUTH EVERY DAY   carvedilol (COREG) 6.25 MG tablet TAKE 1 TABLET BY MOUTH 2 TIMES DAILY WITH A MEAL   docusate sodium (COLACE) 100 MG capsule Take 1 capsule (100 mg total) by mouth 2 (two) times daily as needed for mild constipation.   doxycycline (VIBRA-TABS) 100 MG tablet TAKE 1 TABLET BY MOUTH EVERY TWELVE HOURS   FEROSUL 325 (65 Fe) MG tablet TAKE 1 TABLET BY MOUTH EVERY DAY WITH BREAKFAST   furosemide (LASIX) 20 MG tablet Take 20 mg by mouth daily x 3 days   hydrALAZINE (APRESOLINE) 50 MG tablet Take 1 tablet (50 mg total) by mouth 3 (three) times daily.   hydrocortisone cream 1 % Apply 1 application topically daily as needed for itching.   mirtazapine (REMERON) 15 MG tablet TAKE 1 TABLET BY MOUTH AT BEDTIME   Multiple Vitamin (MULTIVITAMIN) capsule Take 1 capsule by mouth daily.   nicotine (NICODERM CQ - DOSED IN MG/24 HOURS) 21 mg/24hr patch Place 1 patch (21 mg total) onto the skin daily.   pantoprazole (PROTONIX) 40 MG tablet  TAKE 1 TABLET BY MOUTH EVERY DAY   predniSONE (DELTASONE) 5 MG tablet Label  & dispense according to the schedule below. take 8 Pills PO for 3 days, 6 Pills PO for 3 days, 4 Pills PO for 3 days, 2 Pills PO for 3 days, 1 Pills PO for 3 days, 1/2 Pill  PO for 3 days then STOP. Total 65 pills.   Tiotropium Bromide-Olodaterol (STIOLTO RESPIMAT) 2.5-2.5 MCG/ACT AERS Inhale 2 puffs into the lungs daily.   traMADol (ULTRAM) 50 MG tablet Take 1 tablet (50 mg total) by mouth every 6 (six) hours as needed. (Patient taking differently: Take 50 mg by mouth every 6 (six) hours as needed for moderate pain.)   No facility-administered encounter medications on file as of 07/11/2021.   07/11/2021 Name: Kimberly [name] MRN: [id number] DOB: 08/31/1955 Kimberly [name] is a 65 y.o. year old female who is a primary care patient of Kimberly [name], [place].  Comprehensive medication review performed; Spoke to patient regarding cholesterol  Lipid Panel    Component Value Date/Time   CHOL 114 09/02/2019 1651   TRIG 58 09/02/2019 1651   HDL 47 09/02/2019 1651   LDLCALC 54 09/02/2019 1651    10-year ASCVD risk score: The ASCVD Risk score (Arnett DK, et al., 2019) failed to calculate for the following reasons:   The patient has a prior MI or stroke diagnosis  Current antihyperlipidemic regimen:  Atorvastatin 80 mg every  other day  Previous antihyperlipidemic medications tried: None  ASCVD risk enhancing conditions: HTN and current smoker What recent interventions/DTPs have been made by any provider to improve Cholesterol control since last CPP Visit:  Educated on Cholesterol goals;  Importance of limiting foods high in cholesterol; -Recommended to continue current medication  Any recent hospitalizations or ED visits since last visit with CPP? No What diet changes have been made to improve Cholesterol?  Patient's sister states patient has limited her fried food intake.  What exercise is being done to improve  Cholesterol?  Patient goes to physical therapy to help with walking.  Adherence Review: Does the patient have >5 day gap between last estimated fill dates? No  Reviewed chart prior to disease state call. Spoke with patient regarding BP  Recent Office Vitals: BP Readings from Last 3 Encounters:  05/04/21 105/66  05/03/21 108/72  04/28/21 110/70   Pulse Readings from Last 3 Encounters:  05/04/21 81  05/03/21 84  04/28/21 (!) 115    Wt Readings from Last 3 Encounters:  02/10/21 118 lb 12.8 oz (53.9 kg)  01/25/21 122 lb 6.4 oz (55.5 kg)  12/18/20 130 lb (59 kg)     Kidney Function Lab Results  Component Value Date/Time   CREATININE 0.87 01/04/2021 04:38 PM   CREATININE 1.09 (H) 12/21/2020 02:51 AM   CREATININE 0.98 05/04/2014 10:47 AM   GFRNONAA 57 (L) 12/21/2020 02:51 AM   GFRAA >60 04/27/2020 02:27 PM    BMP Latest Ref Rng & Units 01/04/2021 12/21/2020 12/20/2020  Glucose 65 - 99 mg/dL 99 141(H) 96  BUN 8 - 27 mg/dL 23 17 12   Creatinine 0.57 - 1.00 mg/dL 0.87 1.09(H) 0.80  BUN/Creat Ratio 12 - 28 26 - -  Sodium 134 - 144 mmol/L 140 135 139  Potassium 3.5 - 5.2 mmol/L 4.4 3.8 3.7  Chloride 96 - 106 mmol/L 103 108 112(H)  CO2 20 - 29 mmol/L 18(L) 21(L) 22  Calcium 8.7 - 10.3 mg/dL 9.4 8.5(L) 8.7(L)    Current antihypertensive regimen:  Amlodipine 10 mg daily Hydralazine 50 mg taking 1 tablet by mouth three times daily Carvedilol 6.25 mg taking 1 tablet by mouth 2 times daily  How often are you checking your Blood Pressure? weekly  Current home BP readings: Patient's sister states patient's daughter has been checking blood pressure and last reading was normal.  What recent interventions/DTPs have been made by any provider to improve Blood Pressure control since last CPP Visit:  Educated on Daily salt intake goal < 2300 mg; Exercise goal of 150 minutes per week; Importance of home blood pressure monitoring; -Counseled to monitor BP at home at least four times per  week, document, and provide log at future appointments -Recommended to continue current medication  Any recent hospitalizations or ED visits since last visit with CPP? No  What diet changes have been made to improve Blood Pressure Control?  Patient's sister states she has a good appetite but has loss weight due to smoking cigarettes.  What exercise is being done to improve your Blood Pressure Control?  Patient has been going to physical therapy to help with walking.  Adherence Review: Is the patient currently on ACE/ARB medication? No Does the patient have >5 day gap between last estimated fill dates? No  NOTES: Patient's sister stated patient needs her flu vaccine. Scheduled patient an appointment at [facility].  Care Gaps: PNA Vac overdue Tdap overdue Pap smear overdue Mammogram overdue Covid booster overdue Flu vaccine overdue Shingrix  overdue  Star Rating Drugs: Atorvastatin 80 mg- Last filled 05-12-2021 30 [facility] [place] Clinical Pharmacist Assistant [phone]

## 2021-07-14 NOTE — Progress Notes (Deleted)
Cardiology Office Note   Date:  07/14/2021   ID:  Kimberly [name], Kimberly [name] 1955-12-14, MRN [id number]  PCP:  [name], FNP  Cardiologist:   [name], MD   No chief complaint on file.      History of Present Illness: Kimberly [name] is a 65 y.o. female who presents for follow up of CAD and CABG.  She had CVA with PCA subacute infarct in 2019.   She had SOB and I sent her for a perfusion study which demonstrated infarct but no ischemia.   She presented to the emergency department and was admitted on 08/19/2020 and was discharged on 08/22/2020.  On presentation she complained of right upper quadrant and right lower quadrant pain that was associated with intermittent periods of nausea and vomiting.  She denied diarrhea but reported constipation.  She was found to be in atrial fibrillation with RVR.  She converted to sinus rhythm 08/20/2020 and her Cardizem GTT was discontinued and she was started on Lopressor 25 mg daily.  She was also continued on her apixaban.  Her TSH was normal and her echocardiogram 08/19/2020 showed an LVEF of 65 to 70%.She was in the hospital in May with possible sepsis.    She returns for follow up.  ***   *** Since I last saw her she was in the hospital with near syncope.  I reviewed these records this visit.  She had possible sepsis with urinary infection and dental caries as a source.  Her cultures were negative but she was treated with antibiotics.  She was very weak and had hypoxic respiratory failure due to poor mechanics and atelectasis.  She had atrial fibrillation with rapid rate.  They had suggested she go to a nursing home but she has been home with her family watching her most of the time and she does have an aide a couple of hours a day.  She is going to have nurses come to visit a couple of times.  She has PT apparently as well.  She has been very weak needing almost total assistance but can stand a little bit and can walk to bedside commode.  She is  not having any new shortness of breath, PND or orthopnea.  She is not having any new palpitations, presyncope or syncope.  She has had no chest pressure, neck or arm discomfort.  She does not notice her atrial fibrillation.  Past Medical History:  Diagnosis Date   Bicipital tenosynovitis    Coronary artery disease 05/2007   [id number]    Fall 02/13/2018   Family history of ischemic heart disease    Gout    Hammer toe    Hip, thigh, leg, and ankle, insect bite, nonvenomous, without mention of infection(916.4)    Hyperlipidemia    Hypertension    Other abnormality of red blood cells    Rotator cuff syndrome of left shoulder    Stroke [facility])     Past Surgical History:  Procedure Laterality Date   CARDIAC CATHETERIZATION     COLONOSCOPY WITH PROPOFOL N/A 01/30/2020   Procedure: COLONOSCOPY WITH PROPOFOL;  Surgeon: Carol Ada, MD;  Location: [facility] ENDOSCOPY;  Service: Endoscopy;  Laterality: N/A;   CORONARY ARTERY BYPASS GRAFT     triple   CORONARY ARTERY BYPASS GRAFT  2008   POLYPECTOMY  01/30/2020   Procedure: POLYPECTOMY;  Surgeon: Carol Ada, MD;  Location: [facility] ENDOSCOPY;  Service: Endoscopy;;   TUBAL LIGATION  Current Outpatient Medications  Medication Sig Dispense Refill   acetaminophen (TYLENOL) 325 MG tablet Take 2 tablets (650 mg total) by mouth every 6 (six) hours as needed for moderate pain, mild pain, fever or headache. 20 tablet 0   amLODipine (NORVASC) 10 MG tablet TAKE 1 TABLET BY MOUTH EVERY DAY 30 tablet 2   apixaban (ELIQUIS) 5 MG TABS tablet Take 1 tablet (5 mg total) by mouth 2 (two) times daily. 180 tablet 3   atorvastatin (LIPITOR) 80 MG tablet TAKE 1 TABLET BY MOUTH EVERY DAY 90 tablet 2   carvedilol (COREG) 6.25 MG tablet TAKE 1 TABLET BY MOUTH 2 TIMES DAILY WITH A MEAL 60 tablet 2   docusate sodium (COLACE) 100 MG capsule Take 1 capsule (100 mg total) by mouth 2 (two) times daily as needed for mild constipation. 20 capsule 0   doxycycline (VIBRA-TABS) 100  MG tablet TAKE 1 TABLET BY MOUTH EVERY TWELVE HOURS 5 tablet 0   FEROSUL 325 (65 Fe) MG tablet TAKE 1 TABLET BY MOUTH EVERY DAY WITH BREAKFAST 30 tablet 8   furosemide (LASIX) 20 MG tablet Take 20 mg by mouth daily x 3 days 30 tablet 0   hydrALAZINE (APRESOLINE) 50 MG tablet Take 1 tablet (50 mg total) by mouth 3 (three) times daily. 270 tablet 3   hydrocortisone cream 1 % Apply 1 application topically daily as needed for itching.     mirtazapine (REMERON) 15 MG tablet TAKE 1 TABLET BY MOUTH AT BEDTIME 30 tablet 2   Multiple Vitamin (MULTIVITAMIN) capsule Take 1 capsule by mouth daily.     nicotine (NICODERM CQ - DOSED IN MG/24 HOURS) 21 mg/24hr patch Place 1 patch (21 mg total) onto the skin daily. 30 patch 3   pantoprazole (PROTONIX) 40 MG tablet TAKE 1 TABLET BY MOUTH EVERY DAY 30 tablet 2   predniSONE (DELTASONE) 5 MG tablet Label  & dispense according to the schedule below. take 8 Pills PO for 3 days, 6 Pills PO for 3 days, 4 Pills PO for 3 days, 2 Pills PO for 3 days, 1 Pills PO for 3 days, 1/2 Pill  PO for 3 days then STOP. Total 65 pills. 65 tablet 0   Tiotropium Bromide-Olodaterol (STIOLTO RESPIMAT) 2.5-2.5 MCG/ACT AERS Inhale 2 puffs into the lungs daily. 4 g 0   traMADol (ULTRAM) 50 MG tablet Take 1 tablet (50 mg total) by mouth every 6 (six) hours as needed. (Patient taking differently: Take 50 mg by mouth every 6 (six) hours as needed for moderate pain.) 20 tablet 0   No current facility-administered medications for this visit.    Allergies:   Clonidine derivatives and Spironolactone    ROS:  Please see the history of present illness.   Otherwise, review of systems are positive for ***.   All other systems are reviewed and negative.    PHYSICAL EXAM: VS:  There were no vitals taken for this visit. , BMI There is no height or weight on file to calculate BMI.  GENERAL:  Well appearing NECK:  No jugular venous distention, waveform within normal limits, carotid upstroke brisk and  symmetric, no bruits, no thyromegaly LUNGS:  Clear to auscultation bilaterally CHEST:  *** HEART:  PMI not displaced or sustained,S1 and S2 within normal limits, no S3, no S4, no clicks, no rubs, *** murmurs ABD:  Flat, positive bowel sounds normal in frequency in pitch, no bruits, no rebound, no guarding, no midline pulsatile mass, no hepatomegaly, no splenomegaly EXT:  2 plus pulses throughout, no edema, no cyanosis no clubbing    ***GEN:  No distress, mildly frail NECK:  No jugular venous distention at 90 degrees, waveform within normal limits, carotid upstroke brisk and symmetric, no bruits, no thyromegaly LYMPHATICS:  No cervical adenopathy LUNGS:  Clear to auscultation bilaterally BACK:  No CVA tenderness CHEST:  Unremarkable HEART:  S1 and S2 within normal limits, no G6,KZ clicks, no rubs, no murmurs, irregular ABD:  Positive bowel sounds normal in frequency in pitch, no bruits, no rebound, no guarding, unable to assess midline mass or bruit with the patient seated. EXT:  2 plus pulses throughout, moderate edema, no cyanosis no clubbing SKIN:  No rashes no nodules NEURO:  Cranial nerves II through XII grossly intact, motor grossly intact throughout PSYCH:  Cognitively intact, oriented to person place and time   EKG:  EKG is *** ordered today. ***  Recent Labs: 08/19/2020: TSH 0.897 01/04/2021: ALT 63; BNP 585.7; BUN 23; Creatinine, Ser 0.87; Hemoglobin 12.6; Magnesium 2.1; Platelets 307; Potassium 4.4; Sodium 140    Lipid Panel    Component Value Date/Time   CHOL 114 09/02/2019 1651   TRIG 58 09/02/2019 1651   HDL 47 09/02/2019 1651   CHOLHDL 2.4 09/02/2019 1651   CHOLHDL 3.6 02/14/2018 0314   VLDL 16 02/14/2018 0314   LDLCALC 54 09/02/2019 1651      Wt Readings from Last 3 Encounters:  02/10/21 118 lb 12.8 oz (53.9 kg)  01/25/21 122 lb 6.4 oz (55.5 kg)  12/18/20 130 lb (59 kg)      Other studies Reviewed: Additional studies/ records that were reviewed today  include: *** Review of the above records demonstrates:  Please see elsewhere in the note.     ASSESSMENT AND PLAN:  CAD:  ***  The patient has no new sypmtoms.  No further cardiovascular testing is indicated.  We will continue with aggressive risk reduction and meds as listed.  DYSPNEA:   Her breathing seems to be at baseline.  *** No change in therapy.  HTN: Her blood pressure is *** running low.  Reduce her hydralazine to 50 mg 3 times daily.  They will keep a blood pressure log and we may be able to go down further on some of her medications.  She previously been hypertensive.  We did give her a blood pressure cuff to check this today.   CAROTID STENOSIS:   *** She has bilateral carotid artery occlusion and we are managing this medically.  PAF:  Kimberly [name] has a CHA2DS2 - VASc score of 4.  ***She tolerates anticoagulation and we have talked about monitoring her with a pulse oximeter.  No change in therapy.     Current medicines are reviewed at length with the patient today.  The patient does not have concerns regarding medicines.  The following changes have been made: As above  Labs/ tests ordered today include: None  No orders of the defined types were placed in this encounter.    Disposition:   FU with me in 2 - 3 months.  She can also see an APP.    Signed, [name], MD  07/14/2021 3:52 PM    [facility]  1

## 2021-07-15 ENCOUNTER — Ambulatory Visit: Payer: Medicaid Other | Admitting: Cardiology

## 2021-07-15 DIAGNOSIS — I25810 Atherosclerosis of coronary artery bypass graft(s) without angina pectoris: Secondary | ICD-10-CM

## 2021-07-15 DIAGNOSIS — I10 Essential (primary) hypertension: Secondary | ICD-10-CM

## 2021-07-15 DIAGNOSIS — I480 Paroxysmal atrial fibrillation: Secondary | ICD-10-CM

## 2021-07-15 DIAGNOSIS — R0602 Shortness of breath: Secondary | ICD-10-CM

## 2021-07-15 DIAGNOSIS — I6350 Cerebral infarction due to unspecified occlusion or stenosis of unspecified cerebral artery: Secondary | ICD-10-CM

## 2021-08-10 ENCOUNTER — Other Ambulatory Visit: Payer: Self-pay

## 2021-08-10 ENCOUNTER — Other Ambulatory Visit (HOSPITAL_COMMUNITY)
Admission: RE | Admit: 2021-08-10 | Discharge: 2021-08-10 | Disposition: A | Discharge status: Home / Self Care | Payer: Medicare Other | Source: Ambulatory Visit | Attending: Nurse Practitioner | Admitting: Nurse Practitioner

## 2021-08-10 ENCOUNTER — Ambulatory Visit (INDEPENDENT_AMBULATORY_CARE_PROVIDER_SITE_OTHER): Payer: Medicaid Other | Admitting: Nurse Practitioner

## 2021-08-10 ENCOUNTER — Encounter: Payer: Self-pay | Admitting: Nurse Practitioner

## 2021-08-10 ENCOUNTER — Telehealth: Payer: Self-pay | Admitting: *Deleted

## 2021-08-10 VITALS — BP 122/80 | HR 96 | Temp 99.1°F | Ht 67.0 in | Wt 119.2 lb

## 2021-08-10 DIAGNOSIS — Z124 Encounter for screening for malignant neoplasm of cervix: Secondary | ICD-10-CM | POA: Insufficient documentation

## 2021-08-10 DIAGNOSIS — Z993 Dependence on wheelchair: Secondary | ICD-10-CM

## 2021-08-10 DIAGNOSIS — R7303 Prediabetes: Secondary | ICD-10-CM

## 2021-08-10 DIAGNOSIS — H538 Other visual disturbances: Secondary | ICD-10-CM | POA: Diagnosis not present

## 2021-08-10 DIAGNOSIS — I10 Essential (primary) hypertension: Secondary | ICD-10-CM

## 2021-08-10 DIAGNOSIS — N898 Other specified noninflammatory disorders of vagina: Secondary | ICD-10-CM | POA: Insufficient documentation

## 2021-08-10 DIAGNOSIS — Z23 Encounter for immunization: Secondary | ICD-10-CM

## 2021-08-10 DIAGNOSIS — Z1231 Encounter for screening mammogram for malignant neoplasm of breast: Secondary | ICD-10-CM

## 2021-08-10 DIAGNOSIS — I700 Atherosclerosis of aorta: Secondary | ICD-10-CM

## 2021-08-10 DIAGNOSIS — E2839 Other primary ovarian failure: Secondary | ICD-10-CM | POA: Diagnosis not present

## 2021-08-10 DIAGNOSIS — I4811 Longstanding persistent atrial fibrillation: Secondary | ICD-10-CM

## 2021-08-10 DIAGNOSIS — I119 Hypertensive heart disease without heart failure: Secondary | ICD-10-CM

## 2021-08-10 DIAGNOSIS — Z87891 Personal history of nicotine dependence: Secondary | ICD-10-CM

## 2021-08-10 DIAGNOSIS — Z0000 Encounter for general adult medical examination without abnormal findings: Secondary | ICD-10-CM | POA: Diagnosis not present

## 2021-08-10 DIAGNOSIS — I69359 Hemiplegia and hemiparesis following cerebral infarction affecting unspecified side: Secondary | ICD-10-CM

## 2021-08-10 DIAGNOSIS — Z79899 Other long term (current) drug therapy: Secondary | ICD-10-CM

## 2021-08-10 MED ORDER — TETANUS-DIPHTH-ACELL PERTUSSIS 5-2.5-18.5 LF-MCG/0.5 IM SUSP: 0.5000 mL | Freq: Once | INTRAMUSCULAR | 0 refills | Status: AC

## 2021-08-10 MED ORDER — ZOSTER VAC RECOMB ADJUVANTED 50 MCG/0.5ML IM SUSR: 0.5000 mL | Freq: Once | INTRAMUSCULAR | 0 refills | Status: AC

## 2021-08-10 NOTE — Chronic Care Management (AMB) (Signed)
°  Care Management   Note  08/10/2021 Name: MATSUE STROM MRN: [id number] DOB: 16-Dec-1955  Heloise Beecham Eastham is a 65 y.o. year old female who is a primary care patient of [name], [place] and is actively engaged with the care management team. I reached out to [name] by phone today to assist with re-scheduling a follow up visit with the RN Case Manager  Follow up plan: Telephone appointment with care management team member scheduled for:09/20/21  [facility]  Direct Dial: [phone]

## 2021-08-10 NOTE — Chronic Care Management (AMB) (Signed)
°  Care Management   Note  08/10/2021 Name: [name] MRN: [id number] DOB: [birth year]  Kimberly [name] is a 65 y.o. year old female who is a primary care patient of [name], [place] and is actively engaged with the care management team. I reached out to [place] by phone today to assist with re-scheduling a follow up visit with the RN Case Manager  Follow up plan: Unsuccessful telephone outreach attempt made. A HIPAA compliant phone message was left for the patient providing contact information and requesting a return call.  The care management team will reach out to the patient again over the next 7 days.  If patient returns call to provider office, please advise to call [place] at [phone].  [facility]  Direct Dial: [phone]

## 2021-08-10 NOTE — Progress Notes (Signed)
I,Kimberly [name],acting as a Education administrator for [employer], FNP.,have documented all relevant documentation on the behalf of Kimberly Brine, FNP,as directed by  Kimberly Brine, FNP while in the presence of Kimberly [name], Kimberly [name].   This visit occurred during the SARS-CoV-2 public health emergency. Safety protocols were in place, including screening questions prior to the visit, additional usage of staff PPE, and extensive cleaning of exam room while observing appropriate contact time as indicated for disinfecting solutions.  Subjective:     Patient ID: Kimberly [name] , female    DOB: 04/29/1956 , 65 y.o.   MRN: [id number]   Chief Complaint  Patient presents with   Annual Exam    HPI  Patient presents today for hm. Patient would like for you to order her a new wheelchair. She completed physical therapy about 2 months.  Needs a wheelchair with legs unable to move about on her own.   Wt Readings from Last 3 Encounters: 08/10/21 : 119 lb 3.2 oz (54.1 kg) 02/10/21 : 118 lb 12.8 oz (53.9 kg) 01/25/21 : 122 lb 6.4 oz (55.5 kg)      Past Medical History:  Diagnosis Date   Bicipital tenosynovitis    Coronary artery disease 05/2007   [id number]    Fall 02/13/2018   Family history of ischemic heart disease    Gout    Hammer toe    Hip, thigh, leg, and ankle, insect bite, nonvenomous, without mention of infection(916.4)    Hyperlipidemia    Hypertension    Other abnormality of red blood cells    Rotator cuff syndrome of left shoulder    Stroke ([place])      Family History  Problem Relation Age of Onset   Hypertension Mother    Heart attack Father    Diabetes Father    Kidney failure Father    Anxiety disorder Sister    Sarcoidosis Sister      Current Outpatient Medications:    acetaminophen (TYLENOL) 325 MG tablet, Take 2 tablets (650 mg total) by mouth every 6 (six) hours as needed for moderate pain, mild pain, fever or headache., Disp: 20 tablet, Rfl: 0   apixaban (ELIQUIS) 5 MG TABS  tablet, Take 1 tablet (5 mg total) by mouth 2 (two) times daily., Disp: 180 tablet, Rfl: 3   atorvastatin (LIPITOR) 80 MG tablet, TAKE 1 TABLET BY MOUTH EVERY DAY, Disp: 90 tablet, Rfl: 2   carvedilol (COREG) 6.25 MG tablet, TAKE 1 TABLET BY MOUTH 2 TIMES DAILY WITH A MEAL, Disp: 60 tablet, Rfl: 2   docusate sodium (COLACE) 100 MG capsule, Take 1 capsule (100 mg total) by mouth 2 (two) times daily as needed for mild constipation., Disp: 20 capsule, Rfl: 0   doxycycline (VIBRA-TABS) 100 MG tablet, TAKE 1 TABLET BY MOUTH EVERY TWELVE HOURS (Patient not taking: Reported on 08/24/2021), Disp: 5 tablet, Rfl: 0   FEROSUL 325 (65 Fe) MG tablet, TAKE 1 TABLET BY MOUTH EVERY DAY WITH BREAKFAST, Disp: 30 tablet, Rfl: 8   furosemide (LASIX) 20 MG tablet, Take 20 mg by mouth daily x 3 days (Patient not taking: Reported on 08/24/2021), Disp: 30 tablet, Rfl: 0   hydrocortisone cream 1 %, Apply 1 application topically daily as needed for itching., Disp: , Rfl:    mirtazapine (REMERON) 15 MG tablet, TAKE 1 TABLET BY MOUTH AT BEDTIME, Disp: 30 tablet, Rfl: 2   Multiple Vitamin (MULTIVITAMIN) capsule, Take 1 capsule by mouth daily., Disp: , Rfl:    pantoprazole (  PROTONIX) 40 MG tablet, TAKE 1 TABLET BY MOUTH EVERY DAY, Disp: 30 tablet, Rfl: 2   Tiotropium Bromide-Olodaterol (STIOLTO RESPIMAT) 2.5-2.5 MCG/ACT AERS, Inhale 2 puffs into the lungs daily., Disp: 4 g, Rfl: 0   traMADol (ULTRAM) 50 MG tablet, Take 1 tablet (50 mg total) by mouth every 6 (six) hours as needed. (Patient taking differently: Take 50 mg by mouth every 6 (six) hours as needed for moderate pain.), Disp: 20 tablet, Rfl: 0   amLODipine (NORVASC) 5 MG tablet, Take 1 tablet (5 mg total) by mouth daily., Disp: 90 tablet, Rfl: 3   nicotine (NICODERM CQ - DOSED IN MG/24 HOURS) 21 mg/24hr patch, Place 1 patch (21 mg total) onto the skin daily., Disp: 30 patch, Rfl: 3   Vitamin D, Ergocalciferol, (DRISDOL) 1.25 MG (50000 UNIT) CAPS capsule, Take 1 capsule  (50,000 Units total) by mouth every 7 (seven) days., Disp: 12 capsule, Rfl: 1   Allergies  Allergen Reactions   Clonidine Derivatives Other (See Comments)    Patch causes scars   Spironolactone Rash      The patient states she is  post menopausal status.  No LMP recorded. Patient is postmenopausal.. Negative for Dysmenorrhea and Negative for Menorrhagia. Negative for: breast discharge, breast lump(s), breast pain and breast self exam. Associated symptoms include abnormal vaginal bleeding. Pertinent negatives include abnormal bleeding (hematology), anxiety, decreased libido, depression, difficulty falling sleep, dyspareunia, history of infertility, nocturia, sexual dysfunction, sleep disturbances, urinary incontinence, urinary urgency, vaginal discharge and vaginal itching. Diet regular; her daughter Kimberly [name] says she is eating better, will eat 3 times a day.  She eats mostly breakfast and lunch, small dinner. The patient states her exercise level is moderate -   The patient's tobacco use is:  Social History   Tobacco Use  Smoking Status Former   Packs/day: 2.00   Years: 35.00   Pack years: 70.00   Types: Cigarettes   Quit date: 08/12/2019   Years since quitting: 2.0  Smokeless Tobacco Former   Quit date: 08/09/2008  Tobacco Comments   Stopped smoking in May 2022 [facility]   She has been exposed to passive smoke. The patient's alcohol use is:  Social History   Substance and Sexual Activity  Alcohol Use Not Currently   Comment: ocassionally wine   Additional information: Last pap 2014, next one scheduled for today.    Review of Systems  Constitutional: Negative.   HENT: Negative.    Eyes: Negative.   Respiratory: Negative.    Cardiovascular: Negative.   Gastrointestinal: Negative.   Endocrine: Negative.   Genitourinary: Negative.   Musculoskeletal: Negative.   Skin: Negative.   Allergic/Immunologic: Negative.   Neurological: Negative.   Hematological: Negative.    Psychiatric/Behavioral: Negative.      Today's Vitals   08/10/21 1030  BP: 122/80  Pulse: 96  Temp: 99.1 F (37.3 C)  Weight: 119 lb 3.2 oz (54.1 kg)  Height: _0  (1.702 m)  PainSc: 0-No pain   Body mass index is 18.67 kg/m.   Objective:  Physical Exam Vitals reviewed. Exam conducted with a chaperone present.  Constitutional:      General: She is not in acute distress.    Appearance: Normal appearance. She is well-developed.  HENT:     Head: Normocephalic and atraumatic.     Right Ear: Hearing, tympanic membrane, ear canal and external ear normal. There is no impacted cerumen.     Left Ear: Hearing, tympanic membrane, ear canal and external ear normal. There  is no impacted cerumen.     Nose:     Comments: Deferred - masked    Mouth/Throat:     Comments: Deferred - masked Eyes:     General: Lids are normal.     Extraocular Movements: Extraocular movements intact.     Conjunctiva/sclera: Conjunctivae normal.     Pupils: Pupils are equal, round, and reactive to light.     Funduscopic exam:    Right eye: No papilledema.        Left eye: No papilledema.  Neck:     Thyroid: No thyroid mass.     Vascular: No carotid bruit.  Cardiovascular:     Rate and Rhythm: Normal rate and regular rhythm.     Pulses: Normal pulses.     Heart sounds: Normal heart sounds. No murmur heard. Pulmonary:     Effort: Pulmonary effort is normal.     Breath sounds: Normal breath sounds.  Chest:     Chest wall: No mass.  Breasts:    Tanner Score is 5.     Right: Normal. No mass or tenderness.     Left: Normal. No mass or tenderness.  Abdominal:     General: Abdomen is flat. Bowel sounds are normal. There is no distension.     Palpations: Abdomen is soft.     Tenderness: There is no abdominal tenderness.  Genitourinary:    Tanner stage (genital): 5.     Labia:        Right: No rash.        Left: No rash.      Vagina: Vaginal discharge (thick white discharge) present.     Adnexa:  Right adnexa normal and left adnexa normal.       Right: No tenderness.         Left: No tenderness.       Comments: Very difficult to do PAP, unable to visualize cervix Musculoskeletal:        General: Deformity (left upper arm with mild contracture to wrist) present. No swelling or tenderness. Normal range of motion.     Cervical back: Full passive range of motion without pain, normal range of motion and neck supple.     Right lower leg: No edema.     Left lower leg: No edema.     Comments: Left hemiparesis  Lymphadenopathy:     Upper Body:     Right upper body: No supraclavicular, axillary or pectoral adenopathy.     Left upper body: No supraclavicular, axillary or pectoral adenopathy.  Skin:    General: Skin is warm and dry.     Capillary Refill: Capillary refill takes less than 2 seconds.  Neurological:     General: No focal deficit present.     Mental Status: She is alert and oriented to person, place, and time.     Cranial Nerves: No cranial nerve deficit.     Sensory: No sensory deficit.     Motor: No weakness.  Psychiatric:        Mood and Affect: Mood normal.        Behavior: Behavior normal.        Thought Content: Thought content normal.        Judgment: Judgment normal.        Assessment And Plan:     1. Encounter for general adult medical examination w/o abnormal findings Behavior modifications discussed and diet history reviewed.   Pt will continue to exercise regularly and modify  diet with low GI, plant based foods and decrease intake of processed foods.  Recommend intake of daily multivitamin, Vitamin D, and calcium.  Recommend mammogram and colonoscopy for preventive screenings, as well as recommend immunizations that include influenza, TDAP, and Shingles  2. Essential hypertension Comments: Blood pressure is controlled. Continue current medications - CMP14+EGFR - VITAMIN D 25 Hydroxy (Vit-D Deficiency, Fractures)  3. Prediabetes Comments: Stable, no  current medications - Lipid panel - Hemoglobin A1c  4. Immunization due - Tdap (BOOSTRIX) 5-2.5-18.5 LF-MCG/0.5 injection; Inject 0.5 mLs into the muscle once for 1 dose.  Dispense: 0.5 mL; Refill: 0  5. Encounter for long-term (current) use of medications - CBC  6. Blurred vision Comments: Will refer to opthalmology for further evaluation  7. Encounter for screening mammogram for breast cancer Pt instructed on Self Breast Exam.According to ACOG guidelines Women aged 10 and older are recommended to get an annual mammogram. Form completed and given to patient contact the [facility] for appointment scheduing.  Pt encouraged to get annual mammogram - MM 3D SCREEN BREAST BILATERAL; Future  8. Decreased estrogen level - DG MOBILE BONE DENSITY; Future  9. Vaginal discharge Comments: Thick white vaginal discharge noted during pelvic exam - Cervicovaginal ancillary only  10. Encounter for Papanicolaou smear of cervix Very difficult to do pap due to limited movement to left side, if needed in future may need to go to GYN - Cytology - PAP  11. Wheelchair dependence Comments: She needs a new wheelchair the one she has has broken pieces. She is dependent on this to get around. Left hemiparesis    Patient was given opportunity to ask questions. Patient verbalized understanding of the plan and was able to repeat key elements of the plan. All questions were answered to their satisfaction.   Kimberly Brine, FNP  I, Kimberly Brine, FNP, have reviewed all documentation for this visit. The documentation on 08/10/21 for the exam, diagnosis, procedures, and orders are all accurate and complete.   THE PATIENT IS ENCOURAGED TO PRACTICE SOCIAL DISTANCING DUE TO THE COVID-19 PANDEMIC.

## 2021-08-10 NOTE — Patient Instructions (Addendum)
Health Maintenance, Female °Adopting a healthy lifestyle and getting preventive care are important in promoting health and wellness. Ask your health care provider about: °The right schedule for you to have regular tests and exams. °Things you can do on your own to prevent diseases and keep yourself healthy. °What should I know about diet, weight, and exercise? °Eat a healthy diet ° °Eat a diet that includes plenty of vegetables, fruits, low-fat dairy products, and lean protein. °Do not eat a lot of foods that are high in solid fats, added sugars, or sodium. °Maintain a healthy weight °Body mass index (BMI) is used to identify weight problems. It estimates body fat based on height and weight. Your health care provider can help determine your BMI and help you achieve or maintain a healthy weight. °Get regular exercise °Get regular exercise. This is one of the most important things you can do for your health. Most adults should: °Exercise for at least 150 minutes each week. The exercise should increase your heart rate and make you sweat (moderate-intensity exercise). °Do strengthening exercises at least twice a week. This is in addition to the moderate-intensity exercise. °Spend less time sitting. Even light physical activity can be beneficial. °Watch cholesterol and blood lipids °Have your blood tested for lipids and cholesterol at 65 years of age, then have this test every 5 years. °Have your cholesterol levels checked more often if: °Your lipid or cholesterol levels are high. °You are older than 65 years of age. °You are at high risk for heart disease. °What should I know about cancer screening? °Depending on your health history and family history, you may need to have cancer screening at various ages. This may include screening for: °Breast cancer. °Cervical cancer. °Colorectal cancer. °Skin cancer. °Lung cancer. °What should I know about heart disease, diabetes, and high blood pressure? °Blood pressure and heart  disease °High blood pressure causes heart disease and increases the risk of stroke. This is more likely to develop in people who have high blood pressure readings or are overweight. °Have your blood pressure checked: °Every 3-5 years if you are 18-39 years of age. °Every year if you are 40 years old or older. °Diabetes °Have regular diabetes screenings. This checks your fasting blood sugar level. Have the screening done: °Once every three years after age 40 if you are at a normal weight and have a low risk for diabetes. °More often and at a younger age if you are overweight or have a high risk for diabetes. °What should I know about preventing infection? °Hepatitis B °If you have a higher risk for hepatitis B, you should be screened for this virus. Talk with your health care provider to find out if you are at risk for hepatitis B infection. °Hepatitis C °Testing is recommended for: °Everyone born from 1945 through 1965. °Anyone with known risk factors for hepatitis C. °Sexually transmitted infections (STIs) °Get screened for STIs, including gonorrhea and chlamydia, if: °You are sexually active and are younger than 65 years of age. °You are older than 65 years of age and your health care provider tells you that you are at risk for this type of infection. °Your sexual activity has changed since you were last screened, and you are at increased risk for chlamydia or gonorrhea. Ask your health care provider if you are at risk. °Ask your health care provider about whether you are at high risk for HIV. Your health care provider may recommend a prescription medicine to help prevent HIV   infection. If you choose to take medicine to prevent HIV, you should first get tested for HIV. You should then be tested every 3 months for as long as you are taking the medicine. Pregnancy If you are about to stop having your period (premenopausal) and you may become pregnant, seek counseling before you get pregnant. Take 400 to 800  micrograms (mcg) of folic acid every day if you become pregnant. Ask for birth control (contraception) if you want to prevent pregnancy. Osteoporosis and menopause Osteoporosis is a disease in which the bones lose minerals and strength with aging. This can result in bone fractures. If you are 32 years old or older, or if you are at risk for osteoporosis and fractures, ask your health care provider if you should: Be screened for bone loss. Take a calcium or vitamin D supplement to lower your risk of fractures. Be given hormone replacement therapy (HRT) to treat symptoms of menopause. Follow these instructions at home: Alcohol use Do not drink alcohol if: Your health care provider tells you not to drink. You are pregnant, may be pregnant, or are planning to become pregnant. If you drink alcohol: Limit how much you have to: 0-1 drink a day. Know how much alcohol is in your drink. In the [place] one drink equals one 12 oz bottle of beer (355 mL), one 5 oz glass of wine (148 mL), or one 1 oz glass of hard liquor (44 mL). Lifestyle Do not use any products that contain nicotine or tobacco. These products include cigarettes, chewing tobacco, and vaping devices, such as e-cigarettes. If you need help quitting, ask your health care provider. Do not use street drugs. Do not share needles. Ask your health care provider for help if you need support or information about quitting drugs. General instructions Schedule regular health, dental, and eye exams. Stay current with your vaccines. Tell your health care provider if: You often feel depressed. You have ever been abused or do not feel safe at home. Summary Adopting a healthy lifestyle and getting preventive care are important in promoting health and wellness. Follow your health care provider's instructions about healthy diet, exercising, and getting tested or screened for diseases. Follow your health care provider's instructions on monitoring your  cholesterol and blood pressure. This information is not intended to replace advice given to you by your health care provider. Make sure you discuss any questions you have with your health care provider. Document Revised: 12/20/2020 Document Reviewed: 12/20/2020 Elsevier Patient Education  [place]   [phone] - call to make an appt for your eye exam

## 2021-08-11 LAB — CBC
Hematocrit: 39.7 % (ref 34.0–46.6)
Hemoglobin: 11.8 g/dL (ref 11.1–15.9)
MCH: 22 pg — ABNORMAL LOW (ref 26.6–33.0)
MCHC: 29.7 g/dL — ABNORMAL LOW (ref 31.5–35.7)
MCV: 74 fL — ABNORMAL LOW (ref 79–97)
Platelets: 317 10*3/uL (ref 150–450)
RBC: 5.36 x10E6/uL — ABNORMAL HIGH (ref 3.77–5.28)
RDW: 16.6 % — ABNORMAL HIGH (ref 11.7–15.4)
WBC: 5.3 10*3/uL (ref 3.4–10.8)

## 2021-08-11 LAB — CMP14+EGFR
ALT: 19 IU/L (ref 0–32)
AST: 18 IU/L (ref 0–40)
Albumin/Globulin Ratio: 2.1 (ref 1.2–2.2)
Albumin: 4.7 g/dL (ref 3.8–4.8)
Alkaline Phosphatase: 123 IU/L — ABNORMAL HIGH (ref 44–121)
BUN/Creatinine Ratio: 16 (ref 12–28)
BUN: 15 mg/dL (ref 8–27)
Bilirubin Total: 0.6 mg/dL (ref 0.0–1.2)
CO2: 22 mmol/L (ref 20–29)
Calcium: 9.6 mg/dL (ref 8.7–10.3)
Chloride: 105 mmol/L (ref 96–106)
Creatinine, Ser: 0.93 mg/dL (ref 0.57–1.00)
Globulin, Total: 2.2 g/dL (ref 1.5–4.5)
Glucose: 66 mg/dL — ABNORMAL LOW (ref 70–99)
Potassium: 3.7 mmol/L (ref 3.5–5.2)
Sodium: 142 mmol/L (ref 134–144)
Total Protein: 6.9 g/dL (ref 6.0–8.5)
eGFR: 68 mL/min/{1.73_m2} (ref >59)

## 2021-08-11 LAB — LIPID PANEL
Chol/HDL Ratio: 2.5 ratio (ref 0.0–4.4)
Cholesterol, Total: 124 mg/dL (ref 100–199)
HDL: 49 mg/dL (ref >39)
LDL Chol Calc (NIH): 59 mg/dL (ref 0–99)
Triglycerides: 79 mg/dL (ref 0–149)
VLDL Cholesterol Cal: 16 mg/dL (ref 5–40)

## 2021-08-11 LAB — VITAMIN D 25 HYDROXY (VIT D DEFICIENCY, FRACTURES): Vit D, 25-Hydroxy: 15.6 ng/mL — ABNORMAL LOW (ref 30.0–100.0)

## 2021-08-11 LAB — HEMOGLOBIN A1C
Est. average glucose Bld gHb Est-mCnc: 126 mg/dL
Hgb A1c MFr Bld: 6 % — ABNORMAL HIGH (ref 4.8–5.6)

## 2021-08-12 LAB — CYTOLOGY - PAP: Diagnosis: NEGATIVE

## 2021-08-12 LAB — CERVICOVAGINAL ANCILLARY ONLY
Bacterial Vaginitis (gardnerella): NEGATIVE
Candida Glabrata: NEGATIVE
Candida Vaginitis: NEGATIVE
Comment: NEGATIVE
Comment: NEGATIVE
Comment: NEGATIVE

## 2021-08-16 ENCOUNTER — Other Ambulatory Visit: Payer: Self-pay | Admitting: Nurse Practitioner

## 2021-08-16 ENCOUNTER — Telehealth: Payer: Medicaid Other

## 2021-08-16 MED ORDER — VITAMIN D (ERGOCALCIFEROL) 1.25 MG (50000 UNIT) PO CAPS: 50000.0000 [IU] | ORAL_CAPSULE | ORAL | 1 refills | Status: DC

## 2021-08-19 ENCOUNTER — Telehealth: Payer: Self-pay

## 2021-08-19 NOTE — Chronic Care Management (AMB) (Signed)
° ° °  Called Leina D Gumina sister. No answer, left message of appointment on 08-24-2021 at 3:00 via telephone visit with [name], Pharm D. Notified to have all medications, supplements, blood pressure and/or blood sugar logs available during appointment and to return call if need to reschedule.   Care Gaps: PNA Vac overdue Tdap overdue 3rd moderna vaccine overdue Shingrix overdue  Star Rating Drug: Atorvastatin 80 mg- Last filled 08-09-2021 30 [facility]  Any gaps in medications fill history? No  [place] Pharmacist Assistant [phone]

## 2021-08-24 ENCOUNTER — Other Ambulatory Visit: Payer: Self-pay

## 2021-08-24 ENCOUNTER — Encounter: Payer: Self-pay | Admitting: Cardiology

## 2021-08-24 ENCOUNTER — Telehealth: Payer: Medicaid Other

## 2021-08-24 ENCOUNTER — Ambulatory Visit (INDEPENDENT_AMBULATORY_CARE_PROVIDER_SITE_OTHER): Payer: Commercial Managed Care - HMO | Admitting: Cardiology

## 2021-08-24 VITALS — BP 70/60 | HR 114 | Ht 67.0 in | Wt 117.6 lb

## 2021-08-24 DIAGNOSIS — I10 Essential (primary) hypertension: Secondary | ICD-10-CM | POA: Diagnosis not present

## 2021-08-24 DIAGNOSIS — Z720 Tobacco use: Secondary | ICD-10-CM | POA: Diagnosis not present

## 2021-08-24 DIAGNOSIS — I480 Paroxysmal atrial fibrillation: Secondary | ICD-10-CM | POA: Diagnosis not present

## 2021-08-24 DIAGNOSIS — I6523 Occlusion and stenosis of bilateral carotid arteries: Secondary | ICD-10-CM

## 2021-08-24 DIAGNOSIS — R0602 Shortness of breath: Secondary | ICD-10-CM

## 2021-08-24 DIAGNOSIS — I2510 Atherosclerotic heart disease of native coronary artery without angina pectoris: Secondary | ICD-10-CM

## 2021-08-24 MED ORDER — AMLODIPINE BESYLATE 5 MG PO TABS: 5.0000 mg | ORAL_TABLET | Freq: Every day | ORAL | 3 refills | Status: DC

## 2021-08-24 MED ORDER — NICOTINE 21 MG/24HR TD PT24: 21.0000 mg | MEDICATED_PATCH | Freq: Every day | TRANSDERMAL | 3 refills | Status: DC

## 2021-08-24 NOTE — Progress Notes (Signed)
Cardiology Office Note   Date:  08/24/2021   ID:  [name], Kimberly [name], MRN [id number]  PCP:  [name], FNP  Cardiologist:   [name], MD   Chief Complaint  Patient presents with   Coronary Artery Disease       History of Present Illness: Kimberly [name] is a 66 y.o. female who presents for follow up of CAD and CABG.  She had CVA with PCA subacute infarct in 2019.   She had SOB and I sent her for a perfusion study which demonstrated infarct but no ischemia.   She presented to the emergency department and was admitted on 08/19/2020 and was discharged on 08/22/2020.  On presentation she complained of right upper quadrant and right lower quadrant pain that was associated with intermittent periods of nausea and vomiting.  She denied diarrhea but reported constipation.  She was found to be in atrial fibrillation with RVR.  She converted to sinus rhythm 08/20/2020 and her Cardizem GTT was discontinued and she was started on Lopressor 25 mg daily.  She was also continued on her apixaban.  Her TSH was normal and her echocardiogram 08/19/2020 showed an LVEF of 65 to 70%.   In May 2022 she was in the hospital with near syncope.  She had possible sepsis with urinary infection and dental caries as a source.  Her cultures were negative but she was treated with antibiotics.  She was very weak and had hypoxic respiratory failure due to poor mechanics and atelectasis.  She had atrial fibrillation with rapid rate.    In my last visit with her I reduced her hydralazine because her blood pressure was running low.  She gets at home around slowly since her stroke.  She can get out of the bed with assistance.  She can ambulate with a walker.  She does not have any overt cardiovascular symptoms such as chest pressure, neck or arm discomfort.  She does not have any shortness of breath, PND or orthopnea.  She does not have any palpitations or awareness that she is in atrial fibrillation.  She has  no presyncope or syncope.  She tolerates anticoagulation.   Past Medical History:  Diagnosis Date   Bicipital tenosynovitis    Coronary artery disease 05/2007   [id number]    Fall 02/13/2018   Family history of ischemic heart disease    Gout    Hammer toe    Hip, thigh, leg, and ankle, insect bite, nonvenomous, without mention of infection(916.4)    Hyperlipidemia    Hypertension    Other abnormality of red blood cells    Rotator cuff syndrome of left shoulder    Stroke [facility])     Past Surgical History:  Procedure Laterality Date   CARDIAC CATHETERIZATION     COLONOSCOPY WITH PROPOFOL N/A 01/30/2020   Procedure: COLONOSCOPY WITH PROPOFOL;  Surgeon: Carol Ada, MD;  Location: [facility] ENDOSCOPY;  Service: Endoscopy;  Laterality: N/A;   CORONARY ARTERY BYPASS GRAFT     triple   CORONARY ARTERY BYPASS GRAFT  2008   POLYPECTOMY  01/30/2020   Procedure: POLYPECTOMY;  Surgeon: Carol Ada, MD;  Location: [facility] ENDOSCOPY;  Service: Endoscopy;;   TUBAL LIGATION       Current Outpatient Medications  Medication Sig Dispense Refill   acetaminophen (TYLENOL) 325 MG tablet Take 2 tablets (650 mg total) by mouth every 6 (six) hours as needed for moderate pain, mild pain, fever or headache. 20 tablet 0  apixaban (ELIQUIS) 5 MG TABS tablet Take 1 tablet (5 mg total) by mouth 2 (two) times daily. 180 tablet 3   atorvastatin (LIPITOR) 80 MG tablet TAKE 1 TABLET BY MOUTH EVERY DAY 90 tablet 2   carvedilol (COREG) 6.25 MG tablet TAKE 1 TABLET BY MOUTH 2 TIMES DAILY WITH A MEAL 60 tablet 2   docusate sodium (COLACE) 100 MG capsule Take 1 capsule (100 mg total) by mouth 2 (two) times daily as needed for mild constipation. 20 capsule 0   FEROSUL 325 (65 Fe) MG tablet TAKE 1 TABLET BY MOUTH EVERY DAY WITH BREAKFAST 30 tablet 8   hydrocortisone cream 1 % Apply 1 application topically daily as needed for itching.     mirtazapine (REMERON) 15 MG tablet TAKE 1 TABLET BY MOUTH AT BEDTIME 30 tablet 2   Multiple  Vitamin (MULTIVITAMIN) capsule Take 1 capsule by mouth daily.     pantoprazole (PROTONIX) 40 MG tablet TAKE 1 TABLET BY MOUTH EVERY DAY 30 tablet 2   Tiotropium Bromide-Olodaterol (STIOLTO RESPIMAT) 2.5-2.5 MCG/ACT AERS Inhale 2 puffs into the lungs daily. 4 g 0   traMADol (ULTRAM) 50 MG tablet Take 1 tablet (50 mg total) by mouth every 6 (six) hours as needed. (Patient taking differently: Take 50 mg by mouth every 6 (six) hours as needed for moderate pain.) 20 tablet 0   Vitamin D, Ergocalciferol, (DRISDOL) 1.25 MG (50000 UNIT) CAPS capsule Take 1 capsule (50,000 Units total) by mouth every 7 (seven) days. 12 capsule 1   amLODipine (NORVASC) 5 MG tablet Take 1 tablet (5 mg total) by mouth daily. 90 tablet 3   doxycycline (VIBRA-TABS) 100 MG tablet TAKE 1 TABLET BY MOUTH EVERY TWELVE HOURS (Patient not taking: Reported on 08/24/2021) 5 tablet 0   furosemide (LASIX) 20 MG tablet Take 20 mg by mouth daily x 3 days (Patient not taking: Reported on 08/24/2021) 30 tablet 0   nicotine (NICODERM CQ - DOSED IN MG/24 HOURS) 21 mg/24hr patch Place 1 patch (21 mg total) onto the skin daily. 30 patch 3   No current facility-administered medications for this visit.    Allergies:   Clonidine derivatives and Spironolactone    ROS:  Please see the history of present illness.   Otherwise, review of systems are positive for no.   All other systems are reviewed and negative.    PHYSICAL EXAM: VS:  BP (!) 70/60 (BP Location: Left Arm, Patient Position: Sitting)    Pulse (!) 114    Ht 5\' 7"  (1.702 m)    Wt 117 lb 9.6 oz (53.3 kg)    SpO2 (!) 79%    BMI 18.42 kg/m  , BMI Body mass index is 18.42 kg/m.  GEN:  No distress NECK:  No jugular venous distention at 90 degrees, waveform within normal limits, carotid upstroke brisk and symmetric, no bruits, no thyromegaly LYMPHATICS:  No cervical adenopathy LUNGS:  Clear to auscultation bilaterally BACK:  No CVA tenderness CHEST:  Unremarkable HEART:  S1 and S2 within  normal limits, no S3, no clicks, no rubs, no murmurs, irregular ABD:  Positive bowel sounds normal in frequency in pitch, no bruits, no rebound, no guarding, unable to assess midline mass or bruit with the patient seated. EXT:  2 plus pulses throughout, moderate edema, no cyanosis no clubbing SKIN:  No rashes no nodules NEURO:  Cranial nerves II through XII grossly intact, motor grossly intact throughout PSYCH:  Cognitively intact, oriented to person place and time  EKG:  EKG is  ordered today. Atrial fibrillation, rate 114, left ventricular hypertrophy with repolarization changes, QTC is prolonged.  The deep T wave inversions are not changed from previous.  Recent Labs: 01/04/2021: BNP 585.7; Magnesium 2.1 08/10/2021: ALT 19; BUN 15; Creatinine, Ser 0.93; Hemoglobin 11.8; Platelets 317; Potassium 3.7; Sodium 142    Lipid Panel    Component Value Date/Time   CHOL 124 08/10/2021 1208   TRIG 79 08/10/2021 1208   HDL 49 08/10/2021 1208   CHOLHDL 2.5 08/10/2021 1208   CHOLHDL 3.6 02/14/2018 0314   VLDL 16 02/14/2018 0314   LDLCALC 59 08/10/2021 1208      Wt Readings from Last 3 Encounters:  08/24/21 117 lb 9.6 oz (53.3 kg)  08/10/21 119 lb 3.2 oz (54.1 kg)  02/10/21 118 lb 12.8 oz (53.9 kg)      Other studies Reviewed: Additional studies/ records that were reviewed today include: Labs Review of the above records demonstrates:  Please see elsewhere in the note.     ASSESSMENT AND PLAN:  CAD:    The patient has no new sypmtoms.  No further cardiovascular testing is indicated.  We will continue with aggressive risk reduction and meds as listed.  HTN: Her blood pressure is very low for unclear reasons.  I am going to stop her hydralazine and reduce Norvasc to 5 mg daily.  I will have her come back in about a month and we should probably discontinue the Norvasc altogether if she is running low.  We might eventually need to go up on the carvedilol or switch to a different  beta-blocker for rate control.    CAROTID STENOSIS:   She has bilateral carotid occlusion and we are managing this medically because of her frailty.   PAF:  Ms. Courtnie Brenes has a CHA2DS2 - VASc score of 4.  She does tolerate anticoagulation.  No change in therapy.  TOBACCO ABUSE:    I did review her nicotine patches and she promised me she would quit smoking.   Current medicines are reviewed at length with the patient today.  The patient does not have concerns regarding medicines.  The following changes have been made: As above  Labs/ tests ordered today include: None  Orders Placed This Encounter  Procedures   EKG 12-Lead      Disposition:   FU with Coletta Memos NP in one month.     Signed, [name], MD  08/24/2021 1:34 PM    [facility]  1

## 2021-08-24 NOTE — Patient Instructions (Signed)
Medication Instructions:  STOP hydralazine DECREASE amlodipine from 10mg  to 5mg  daily Dr. Percival Spanish has refilled nicotine patch  *If you need a refill on your cardiac medications before your next appointment, please call your pharmacy*  Follow-Up: At [facility], you and your health needs are our priority.  As part of our continuing mission to provide you with exceptional heart care, we have created designated Provider Care Teams.  These Care Teams include your primary Cardiologist (physician) and Advanced Practice Providers (APPs -  Physician Assistants and Nurse Practitioners) who all work together to provide you with the care you need, when you need it.  We recommend signing up for the patient portal called "MyChart".  Sign up information is provided on this After Visit Summary.  MyChart is used to connect with patients for Virtual Visits (Telemedicine).  Patients are able to view lab/test results, encounter notes, upcoming appointments, etc.  Non-urgent messages can be sent to your provider as well.   To learn more about what you can do with MyChart, go to [email or web address].    Your next appointment:   1 month with Coletta Memos NP

## 2021-08-25 ENCOUNTER — Other Ambulatory Visit: Payer: Self-pay

## 2021-09-07 ENCOUNTER — Ambulatory Visit: Payer: Commercial Managed Care - HMO | Admitting: Cardiology

## 2021-09-07 ENCOUNTER — Telehealth: Payer: Self-pay

## 2021-09-07 NOTE — Chronic Care Management (AMB) (Signed)
Chronic Care Management Pharmacy Assistant   Name: Kimberly [name]  MRN: [id number] DOB: August 05, 1956  Reason for Encounter: Disease State/ Hypertension  Recent office visits:  08-10-2021 Kimberly [name], [name]. STOP Prednisone. Shingrix and Tdap ordered.   Recent consult visits:  08-24-2021 Kimberly Breeding, MD (Cardiology). STOP Hydralazine. DECREASE Amlodipine 10 mg daily TO 5 mg daily. BP reading 70/60, pulse 114, O2 79%   Hospital visits:  None in previous 6 months  Medications: Outpatient Encounter Medications as of 09/07/2021  Medication Sig   acetaminophen (TYLENOL) 325 MG tablet Take 2 tablets (650 mg total) by mouth every 6 (six) hours as needed for moderate pain, mild pain, fever or headache.   amLODipine (NORVASC) 5 MG tablet Take 1 tablet (5 mg total) by mouth daily.   apixaban (ELIQUIS) 5 MG TABS tablet Take 1 tablet (5 mg total) by mouth 2 (two) times daily.   atorvastatin (LIPITOR) 80 MG tablet TAKE 1 TABLET BY MOUTH EVERY DAY   carvedilol (COREG) 6.25 MG tablet TAKE 1 TABLET BY MOUTH 2 TIMES DAILY WITH A MEAL   docusate sodium (COLACE) 100 MG capsule Take 1 capsule (100 mg total) by mouth 2 (two) times daily as needed for mild constipation.   doxycycline (VIBRA-TABS) 100 MG tablet TAKE 1 TABLET BY MOUTH EVERY TWELVE HOURS (Patient not taking: Reported on 08/24/2021)   FEROSUL 325 (65 Fe) MG tablet TAKE 1 TABLET BY MOUTH EVERY DAY WITH BREAKFAST   furosemide (LASIX) 20 MG tablet Take 20 mg by mouth daily x 3 days (Patient not taking: Reported on 08/24/2021)   hydrocortisone cream 1 % Apply 1 application topically daily as needed for itching.   mirtazapine (REMERON) 15 MG tablet TAKE 1 TABLET BY MOUTH AT BEDTIME   Multiple Vitamin (MULTIVITAMIN) capsule Take 1 capsule by mouth daily.   nicotine (NICODERM CQ - DOSED IN MG/24 HOURS) 21 mg/24hr patch Place 1 patch (21 mg total) onto the skin daily.   pantoprazole (PROTONIX) 40 MG tablet TAKE 1 TABLET BY MOUTH EVERY DAY    Tiotropium Bromide-Olodaterol (STIOLTO RESPIMAT) 2.5-2.5 MCG/ACT AERS Inhale 2 puffs into the lungs daily.   traMADol (ULTRAM) 50 MG tablet Take 1 tablet (50 mg total) by mouth every 6 (six) hours as needed. (Patient taking differently: Take 50 mg by mouth every 6 (six) hours as needed for moderate pain.)   Vitamin D, Ergocalciferol, (DRISDOL) 1.25 MG (50000 UNIT) CAPS capsule Take 1 capsule (50,000 Units total) by mouth every 7 (seven) days.   No facility-administered encounter medications on file as of 09/07/2021.   Reviewed chart prior to disease state call. Spoke with patient regarding BP  Recent Office Vitals: BP Readings from Last 3 Encounters:  08/24/21 (!) 70/60  08/10/21 122/80  05/04/21 105/66   Pulse Readings from Last 3 Encounters:  08/24/21 (!) 114  08/10/21 96  05/04/21 81    Wt Readings from Last 3 Encounters:  08/24/21 117 lb 9.6 oz (53.3 kg)  08/10/21 119 lb 3.2 oz (54.1 kg)  02/10/21 118 lb 12.8 oz (53.9 kg)     Kidney Function Lab Results  Component Value Date/Time   CREATININE 0.93 08/10/2021 12:08 PM   CREATININE 0.87 01/04/2021 04:38 PM   CREATININE 0.98 05/04/2014 10:47 AM   GFRNONAA 57 (L) 12/21/2020 02:51 AM   GFRAA >60 04/27/2020 02:27 PM    BMP Latest Ref Rng & Units 08/10/2021 01/04/2021 12/21/2020  Glucose 70 - 99 mg/dL 66(L) 99 141(H)  BUN 8 - 27 mg/dL  15 23 17   Creatinine 0.57 - 1.00 mg/dL 0.93 0.87 1.09(H)  BUN/Creat Ratio 12 - 28 16 26  -  Sodium 134 - 144 mmol/L 142 140 135  Potassium 3.5 - 5.2 mmol/L 3.7 4.4 3.8  Chloride 96 - 106 mmol/L 105 103 108  CO2 20 - 29 mmol/L 22 18(L) 21(L)  Calcium 8.7 - 10.3 mg/dL 9.6 9.4 8.5(L)    Current antihypertensive regimen:  Amlodipine 5 mg daily Carvedilol 6.25 mg taking 1 tablet by mouth 2 times daily  How often are you checking your Blood Pressure? 1-2x per week  Current home BP readings: 110/80   What recent interventions/DTPs have been made by any provider to improve Blood Pressure control  since last CPP Visit:  Educated on Daily salt intake goal < 2300 mg; Exercise goal of 150 minutes per week; Importance of home blood pressure monitoring; -Counseled to monitor BP at home at least four times per week, document, and provide log at future appointments -Recommended to continue current medication  Any recent hospitalizations or ED visits since last visit with CPP? No  What diet changes have been made to improve Blood Pressure Control?  Patient's sister states patient has been losing weight and family has been working with her to gain appetite back.  What exercise is being done to improve your Blood Pressure Control?  Patient's sister states patient has had a few falls since last visit with Kimberly [name]. Patient's sister reports patient dizziness while standing.     Adherence Review: Is the patient currently on ACE/ARB medication? No Does the patient have >5 day gap between last estimated fill dates? No  NOTES: Patient's sister stated patient fell twice since seeing cardiologist on 08-24-2021 and last fall was on 09-04-2021. Patient has a Caregiver 7 days week for 2 hours. Patient's sister stated patient is on a waiting list to receive more hours for a caregiver but family comes together to help care for her. Patient's sister reported having some challenging days do to a few deaths in the family, she feels patient may be depressed. Patient was referred to a specialist for low blood pressure readings and appointment is on 09-30-2021. Patient is smoking 3 cigarettes daily. Rescheduled patient's telephone appointment with Kimberly [name] and to contact sister for appointment. Sent message to Kimberly Brine FNP and Kimberly [name] CMA to schedule sooner appointment due to falls and no appetite.   Care Gaps: PNA Vac overdue Tdap overdue 3rd moderna vaccine overdue Shingrix overdue  Star Rating Drugs: Atorvastatin 80 mg- Last filled 08-09-2021 30 [facility].   [place]  Pharmacist Assistant [phone]

## 2021-09-13 ENCOUNTER — Encounter: Payer: Self-pay | Admitting: Nurse Practitioner

## 2021-09-19 NOTE — Progress Notes (Deleted)
NEUROLOGY FOLLOW UP OFFICE NOTE  Kimberly [name] [id number]  Assessment/Plan:   1.  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 2.  Paroxysmal atrial fibrillation with RVR 3.  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  Hypertension 5.  Hyperlipidemia 6.  Coronary artery disease 7.  Tobacco use disorder    1.  Secondary stroke prevention as managed by PCP/cardiology:             - Eliquis and ASA 81mg  daily              - Lipitor 80mg  daily (LDL goal less than 70)             - Glycemic control (Hgb A1c goal less than 7)             - Blood pressure control.  Given the thrombosis of right PCA and bilateral carotid artery occlusions, blood pressure goal should be 130-150 to ensure adequate perfusion of collaterals.             - Tobacco cessation             - Mediterranean diet 2.  Gabapentin 100mg .  May take three times daily for pain if needed. 3.  Repeat carotid doppler 4.  Follow up in one year.  Subjective:  Kimberly [name] is a 66 year old right-handed black woman with hypertension, hyperlipidemia, paroxysmal atrial fibrillation, coronary artery disease and prior history of strokes who follows up for stroke.   UPDATE: Current medications:  Eliquis; Lipitor 80mg ; ASA 81mg ; gabapentin 100mg  daily   Last seen July 2021.  Carotid ultrasound on 03/23/2020 again demonstrated occluded bilateral ICAs with antegrade flow within the VAs.    HISTORY:  Kimberly [name] was admitted to [facility] on 02/13/18 for stroke presenting as dizziness, fall and left hemianopia.  She was previously on aspirin and Plavix.  She was not given tPA due to outside therapeutic window.  She was found to have new atrial fibrillation.  CT of head revealed right acute to subacute PCA infarct.  MRI of brain confirmed large acute right PCA territory infarct.  CTA of head and neck showed bilateral ICA occlusions at origin, right  PCA occlusion and basilar artery irregularity but bilateral MCAs patent from collaterals.  Carotid doppler showed bilateral ICA occlusion with possible left ICA fresh clot.  Transcranial doppler showed left ophthalmic artery and right ACA flow reversal consistent with bilateral carotid system occlusion.  2D echo showed EF 60-65%.  LDL was 100.  Hgb A1c was 5.8.  She remained on ASA and Plavix.  Due to large size of stroke, initiation of anticoagulation was postponed until 5 to 7 days post stroke to avoid hemorrhagic conversion.  She was subsequently started on Eliquis.  Plavix subsequently discontinued.  She was started on Lipitor 80mg .     She has residual left sided weakness and pain.  She uses a walker.  She uses a stress ball to strengthen her hand.  Medicaid only approves 3 sessions a year of physical therapy. She smokes cigarettes (about 1 pack per week)    PAST MEDICAL HISTORY: Past Medical History:  Diagnosis Date   Bicipital tenosynovitis    Coronary artery disease 05/2007   [id number]    Fall 02/13/2018   Family history of ischemic heart disease    Gout    Hammer toe    Hip, thigh, leg, and ankle, insect bite, nonvenomous, without  mention of infection(916.4)    Hyperlipidemia    Hypertension    Other abnormality of red blood cells    Rotator cuff syndrome of left shoulder    Stroke Kimberly [name])     MEDICATIONS: Current Outpatient Medications on File Prior to Visit  Medication Sig Dispense Refill   acetaminophen (TYLENOL) 325 MG tablet Take 2 tablets (650 mg total) by mouth every 6 (six) hours as needed for moderate pain, mild pain, fever or headache. 20 tablet 0   amLODipine (NORVASC) 5 MG tablet Take 1 tablet (5 mg total) by mouth daily. 90 tablet 3   apixaban (ELIQUIS) 5 MG TABS tablet Take 1 tablet (5 mg total) by mouth 2 (two) times daily. 180 tablet 3   atorvastatin (LIPITOR) 80 MG tablet TAKE 1 TABLET BY MOUTH EVERY DAY 90 tablet 2   carvedilol (COREG) 6.25 MG tablet TAKE 1 TABLET  BY MOUTH 2 TIMES DAILY WITH A MEAL 60 tablet 2   docusate sodium (COLACE) 100 MG capsule Take 1 capsule (100 mg total) by mouth 2 (two) times daily as needed for mild constipation. 20 capsule 0   doxycycline (VIBRA-TABS) 100 MG tablet TAKE 1 TABLET BY MOUTH EVERY TWELVE HOURS (Patient not taking: Reported on 08/24/2021) 5 tablet 0   FEROSUL 325 (65 Fe) MG tablet TAKE 1 TABLET BY MOUTH EVERY DAY WITH BREAKFAST 30 tablet 8   furosemide (LASIX) 20 MG tablet Take 20 mg by mouth daily x 3 days (Patient not taking: Reported on 08/24/2021) 30 tablet 0   hydrocortisone cream 1 % Apply 1 application topically daily as needed for itching.     mirtazapine (REMERON) 15 MG tablet TAKE 1 TABLET BY MOUTH AT BEDTIME 30 tablet 2   Multiple Vitamin (MULTIVITAMIN) capsule Take 1 capsule by mouth daily.     nicotine (NICODERM CQ - DOSED IN MG/24 HOURS) 21 mg/24hr patch Place 1 patch (21 mg total) onto the skin daily. 30 patch 3   pantoprazole (PROTONIX) 40 MG tablet TAKE 1 TABLET BY MOUTH EVERY DAY 30 tablet 2   Tiotropium Bromide-Olodaterol (STIOLTO RESPIMAT) 2.5-2.5 MCG/ACT AERS Inhale 2 puffs into the lungs daily. 4 g 0   traMADol (ULTRAM) 50 MG tablet Take 1 tablet (50 mg total) by mouth every 6 (six) hours as needed. (Patient taking differently: Take 50 mg by mouth every 6 (six) hours as needed for moderate pain.) 20 tablet 0   Vitamin D, Ergocalciferol, (DRISDOL) 1.25 MG (50000 UNIT) CAPS capsule Take 1 capsule (50,000 Units total) by mouth every 7 (seven) days. 12 capsule 1   No current facility-administered medications on file prior to visit.    ALLERGIES: Allergies  Allergen Reactions   Clonidine Derivatives Other (See Comments)    Patch causes scars   Spironolactone Rash    FAMILY HISTORY: Family History  Problem Relation Age of Onset   Hypertension Mother    Heart attack Father    Diabetes Father    Kidney failure Father    Anxiety disorder Sister    Sarcoidosis Sister       Objective:   *** General: No acute distress.  Patient appears well-groomed.   Head:  Normocephalic/atraumatic Eyes:  Fundi examined but not visualized Neck: supple, no paraspinal tenderness, full range of motion Heart:  Regular rate and rhythm Lungs:  Clear to auscultation bilaterally Back: No paraspinal tenderness Neurological Exam: alert and oriented to person, place, and time. Attention span and concentration intact, recent and remote memory intact, fund of knowledge intact.  Speech fluent and not dysarthric, language intact.  Left homonymous hemianopsia.  Otherwise, CN II-XII intact. Bulk and tone normal, muscle strength:  4+/5 left upper extremity except 3+/5 left hand grip; 4+/5 left lower extremity,, 5/5 right upper, 5-/5 right lower extremities.  Decreased pinprick andvibratory sensation on left, intact on right. Deep tendon reflexes 3+ on left, 2+ on right, toes downgoing.  Finger to nose testing intact.  Uses upper strength to stand.  Significant difficulty maintaining balance to stand.  Hemiplegic gait.  Unsteady.  Requires assistance to ambulate.   Metta Clines, DO  CC: [name], FNP

## 2021-09-20 ENCOUNTER — Ambulatory Visit: Payer: Medicaid Other | Admitting: Neurology

## 2021-09-20 ENCOUNTER — Telehealth: Payer: Medicaid Other

## 2021-09-20 ENCOUNTER — Ambulatory Visit (INDEPENDENT_AMBULATORY_CARE_PROVIDER_SITE_OTHER): Payer: Commercial Managed Care - HMO

## 2021-09-20 ENCOUNTER — Ambulatory Visit: Payer: Medicare Other | Admitting: Neurology

## 2021-09-20 DIAGNOSIS — F32A Depression, unspecified: Secondary | ICD-10-CM | POA: Diagnosis not present

## 2021-09-20 DIAGNOSIS — R7303 Prediabetes: Secondary | ICD-10-CM

## 2021-09-20 DIAGNOSIS — E119 Type 2 diabetes mellitus without complications: Secondary | ICD-10-CM

## 2021-09-20 DIAGNOSIS — I10 Essential (primary) hypertension: Secondary | ICD-10-CM

## 2021-09-21 NOTE — Chronic Care Management (AMB) (Addendum)
Chronic Care Management   CCM RN Visit Note  09/20/2021 Name: Kimberly [name] MRN: [id number] DOB: 11-17-1955  Subjective: Kimberly [name] is a 66 y.o. year old female who is a primary care patient of [name], [place]. The care management team was consulted for assistance with disease management and care coordination needs.    Engaged with patient by telephone for follow up visit in response to provider referral for case management and/or care coordination services.   Consent to Services:  The patient was given information about Chronic Care Management services, agreed to services, and gave verbal consent prior to initiation of services.  Please see initial visit note for detailed documentation.   Patient agreed to services and verbal consent obtained.   Assessment: Review of patient past medical history, allergies, medications, health status, including review of consultants reports, laboratory and other test data, was performed as part of comprehensive evaluation and provision of chronic care management services.   SDOH (Social Determinants of Health) assessments and interventions performed:  Yes, no acute needs   CCM Care Plan  Allergies  Allergen Reactions   Clonidine Derivatives Other (See Comments)    Patch causes scars   Spironolactone Rash    Outpatient Encounter Medications as of 09/20/2021  Medication Sig   acetaminophen (TYLENOL) 325 MG tablet Take 2 tablets (650 mg total) by mouth every 6 (six) hours as needed for moderate pain, mild pain, fever or headache.   amLODipine (NORVASC) 5 MG tablet Take 1 tablet (5 mg total) by mouth daily.   apixaban (ELIQUIS) 5 MG TABS tablet Take 1 tablet (5 mg total) by mouth 2 (two) times daily.   atorvastatin (LIPITOR) 80 MG tablet TAKE 1 TABLET BY MOUTH EVERY DAY   carvedilol (COREG) 6.25 MG tablet TAKE 1 TABLET BY MOUTH 2 TIMES DAILY WITH A MEAL   docusate sodium (COLACE) 100 MG capsule Take 1 capsule (100 mg total) by mouth 2  (two) times daily as needed for mild constipation.   doxycycline (VIBRA-TABS) 100 MG tablet TAKE 1 TABLET BY MOUTH EVERY TWELVE HOURS (Patient not taking: Reported on 08/24/2021)   FEROSUL 325 (65 Fe) MG tablet TAKE 1 TABLET BY MOUTH EVERY DAY WITH BREAKFAST   furosemide (LASIX) 20 MG tablet Take 20 mg by mouth daily x 3 days (Patient not taking: Reported on 08/24/2021)   hydrocortisone cream 1 % Apply 1 application topically daily as needed for itching.   mirtazapine (REMERON) 15 MG tablet TAKE 1 TABLET BY MOUTH AT BEDTIME   Multiple Vitamin (MULTIVITAMIN) capsule Take 1 capsule by mouth daily.   nicotine (NICODERM CQ - DOSED IN MG/24 HOURS) 21 mg/24hr patch Place 1 patch (21 mg total) onto the skin daily.   pantoprazole (PROTONIX) 40 MG tablet TAKE 1 TABLET BY MOUTH EVERY DAY   Tiotropium Bromide-Olodaterol (STIOLTO RESPIMAT) 2.5-2.5 MCG/ACT AERS Inhale 2 puffs into the lungs daily.   traMADol (ULTRAM) 50 MG tablet Take 1 tablet (50 mg total) by mouth every 6 (six) hours as needed. (Patient taking differently: Take 50 mg by mouth every 6 (six) hours as needed for moderate pain.)   Vitamin D, Ergocalciferol, (DRISDOL) 1.25 MG (50000 UNIT) CAPS capsule Take 1 capsule (50,000 Units total) by mouth every 7 (seven) days.   No facility-administered encounter medications on file as of 09/20/2021.    Patient Active Problem List   Diagnosis Date Noted   Pulmonary nodule 01/25/2021   Shortness of breath 01/25/2021   Sepsis ([place]) 12/18/2020   Family  history of malignant neoplasm of gastrointestinal tract 12/07/2020   Personal history of colonic polyps 12/07/2020   Secondary hypercoagulable state ([place]) 10/14/2020   Right sided abdominal pain    Abdominal wall pain    Atrial fibrillation ([place]) 08/19/2020   Educated about COVID-19 virus infection 11/16/2019   Urinary frequency 04/14/2019   Cough 04/14/2019   AKI (acute kidney injury) ([place]) 11/26/2018   Diarrhea 11/25/2018   Dehydration, mild  11/25/2018   Hemiparesis affecting nondominant 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 ([place]) 07/31/2018   Muscle ache 07/31/2018   Prediabetes 07/31/2018   Chronic gout without tophus 07/31/2018   Vision loss 07/31/2018   Malnutrition of moderate degree 02/15/2018   Carotid occlusion, bilateral    Atrial fibrillation with RVR ([place]) 02/13/2018   Positive D dimer 02/13/2018   Back pain 02/13/2018   CVA (cerebral vascular accident) ([place]) 02/13/2018   Paroxysmal atrial fibrillation ([place]) 02/13/2018   Coronary artery disease involving native coronary artery of native heart without angina pectoris 02/13/2018   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[facility]) 02/13/2018   Aortic atherosclerosis ([place]) 02/13/2018   CAD, ARTERY BYPASS GRAFT 05/03/2009   TOBACCO USE, QUIT 05/03/2009   ROTATOR CUFF SYNDROME, LEFT 02/12/2009   Shoulder pain, left 11/03/2008   HAMMER TOE, ACQUIRED 02/11/2008   INSECT BITE, LEG 02/11/2008   Cerebral artery occlusion with cerebral infarction ([place]) 12/05/2007   Dyslipidemia 11/05/2007   UNSPECIFIED DISORDER TEETH&SUPPORTING STRUCTURES 11/05/2007   BICEPS TENDINITIS, RIGHT 04/24/2007   Essential hypertension 04/23/2007   MICROCYTOSIS 04/23/2007    Conditions to be addressed/monitored: Essential hypertension, Prediabetes, Depression   Care Plan : RN Care Manager Plan of Care  Updates made by Lynne Logan, RN since 09/20/2021 12:00 AM     Problem: No Plan of Care established for management of chronic disease states (Essential hypertension, Prediabetes, Depression)   Priority: High     Long-Range Goal: Establishment of plan of care for management of chronic disease states (Essential hypertension, Prediabetes, Depression)   Start Date: 09/20/2021  Expected End Date: 09/20/2022  This Visit's Progress: On track  Priority: High  Note:   Current Barriers:  Knowledge Deficits related to plan of care for management  of Essential hypertension, Prediabetes, Depression   Chronic Disease Management support and education needs related to Essential hypertension, Prediabetes, Depression    RNCM Clinical Goal(s):  Patient will verbalize basic understanding of  Essential hypertension, Prediabetes, Depression  disease process and self health management plan as evidenced by patient will report having no disease exacerbations related to her chronic disease states as listed above take all medications exactly as prescribed and will call provider for medication related questions as evidenced by demonstrate improved understanding of prescribed medications and rationale for usage as evidenced by patient teach back continue to work with RN Care Manager to address care management and care coordination needs related to  Essential hypertension, Prediabetes, Depression  as evidenced by adherence to CM Team Scheduled appointments demonstrate ongoing self health care management ability   as evidenced by    through collaboration with RN Care manager, provider, and care team.   Interventions: 1:1 collaboration with primary care provider regarding development and update of comprehensive plan of care as evidenced by provider attestation and co-signature Inter-disciplinary care team collaboration (see longitudinal plan of care) Evaluation of current treatment plan related to  self management and patient's adherence to plan as established by provider  Diabetes Interventions:  (Status:  Goal  on track:  Yes.) Long Term Goal Assessed patient's understanding of A1c goal:  <5.7% Provided education to patient about basic DM disease process Counseled on importance of regular laboratory monitoring as prescribed Review of patient status, including review of consultants reports, relevant laboratory and other test results, and medications completed Assessed social determinant of health barriers Mailed printed educational materials related to Carb  Choices, Meal Planning and How to Perform Chair Exercises  Discussed plans with patient for ongoing care management follow up and provided patient with direct contact information for care management team Lab Results  Component Value Date   HGBA1C 6.0 (H) 08/10/2021  Depression Interventions:  (Status:  Condition stable.  Not addressed this visit.)  Long Term Goal Evaluation of current treatment plan related to Depression, self-management and patient's adherence to plan as established by provider Provided education to patient/caregiver about basic disease process related to Depression  Review of patient status, including review of consultant's reports, relevant laboratory and other test results, and medications completed. Reviewed medications with patient and discussed importance of medication adherence  Determined patient's cognition and mood are stable Discussed patient's daughter is currently taking time off work and is staying with Kimberly [name] to help care for her, this has improved patient's mood Discussed the family is visiting patient more often and taking her for Sunday drives as well as getting her out of the house more often for dinner dates Encouraged sister Kimberly [name] to continue to monitor patient's condition and notify the CCM team and the PCP of any/all concerns  Discussed plans with patient for ongoing care management follow up and provided patient with direct contact information for care management team Discussed plans with patient for ongoing care management follow up and provided patient with direct contact information for care management team  Hypertension Interventions:  (Status:  Goal on track:  Yes.) Long Term Goal Last practice recorded BP readings:  BP Readings from Last 3 Encounters:  08/24/21 (!) 70/60  08/10/21 122/80  05/04/21 105/66  Most recent eGFR/CrCl:  Lab Results  Component Value Date   EGFR 68 08/10/2021    No components found for: CRCL Completed successful  telephonic follow up call with sister and caregiver [name] Evaluation of current treatment plan related to hypertension self management and patient's adherence to plan as established by provider Reviewed medications with sister Kimberly [name] and discussed importance of compliance, reviewed and discussed recent medication changes and assessed for knowledge and understanding  Advised sister, providing education and rationale, to monitor patient's blood pressure daily and record, calling PCP for findings outside established parameters Provided education on prescribed diet low Sodium Mailed printed educational materials related to How to Accurately Monitor BP at Home  Discussed plans with patient for ongoing care management follow up and provided patient with direct contact information for care management team  Vitamin D deficiency Interventions:  (Status:  New goal.)  Long Term Goal Evaluation of current treatment plan related to Vitamin D deficiency self-management and patient's adherence to plan as established by provider Review of patient status, including review of consultant's reports, relevant laboratory and other test results, and medications completed Reviewed medications with caregiver, including the new Rx for Vitamin D and discussed importance of medication adherence Educated sister Kimberly [name] on ways to improve Vitamin D, take supplement exactly as prescribed, eat a Vitamin D rich diet, try to get at least 15 minutes of natural sunlight daily when possible  Discussed plans with patient for ongoing care management follow up and provided  patient with direct contact information for care management team Vit D, 25-Hydroxy 30.0 - 100.0 ng/mL 15.6 Low      Patient Goals/Self-Care Activities: Take all medications as prescribed Attend all scheduled provider appointments Call pharmacy for medication refills 3-7 days in advance of running out of medications Call provider office for new concerns or  questions  drink 6 to 8 glasses of water each day fill half of plate with vegetables manage portion size write blood pressure results in a log or diary take blood pressure log to all doctor appointments call doctor for signs and symptoms of high blood pressure take medications for blood pressure exactly as prescribed report new symptoms to your doctor  Follow Up Plan:  Telephone follow up appointment with care management team member scheduled for:  12/16/21       Plan:Telephone follow up appointment with care management team member scheduled for:  12/16/21  Barb Merino, RN, BSN, CCM Care Management Coordinator [place] [facility]  Direct Phone: [phone]

## 2021-09-21 NOTE — Patient Instructions (Signed)
Visit Information  Thank you for taking time to visit with me today. Please don't hesitate to contact me if I can be of assistance to you before our next scheduled telephone appointment.  Following are the goals we discussed today:  (Copy and paste patient goals from clinical care plan here)  Our next appointment is by telephone on 12/16/21 at 12 noon  Please call the care guide team at [phone] if you need to cancel or reschedule your appointment.   If you are experiencing a Mental Health or [place] or need someone to talk to, please call 1-800-273-TALK (toll free, 24 hour hotline)   The patient verbalized understanding of instructions, educational materials, and care plan provided today and agreed to receive a mailed copy of patient instructions, educational materials, and care plan.   Barb Merino, RN, BSN, CCM Care Management Coordinator [place] [facility]  Direct Phone: [phone]

## 2021-09-22 ENCOUNTER — Telehealth: Payer: Commercial Managed Care - HMO

## 2021-09-22 ENCOUNTER — Telehealth: Payer: Self-pay

## 2021-09-22 ENCOUNTER — Ambulatory Visit
Admission: RE | Admit: 2021-09-22 | Discharge: 2021-09-22 | Disposition: A | Discharge status: Home / Self Care | Payer: Medicaid Other | Source: Ambulatory Visit | Attending: Nurse Practitioner | Admitting: Nurse Practitioner

## 2021-09-22 ENCOUNTER — Other Ambulatory Visit: Payer: Self-pay

## 2021-09-22 DIAGNOSIS — Z1231 Encounter for screening mammogram for malignant neoplasm of breast: Secondary | ICD-10-CM

## 2021-09-22 DIAGNOSIS — M810 Age-related osteoporosis without current pathological fracture: Secondary | ICD-10-CM | POA: Diagnosis not present

## 2021-09-22 DIAGNOSIS — Z780 Asymptomatic menopausal state: Secondary | ICD-10-CM | POA: Diagnosis not present

## 2021-09-22 DIAGNOSIS — M85852 Other specified disorders of bone density and structure, left thigh: Secondary | ICD-10-CM | POA: Diagnosis not present

## 2021-09-22 DIAGNOSIS — E2839 Other primary ovarian failure: Secondary | ICD-10-CM

## 2021-09-22 NOTE — Telephone Encounter (Signed)
°  Care Management   Follow Up Note   09/22/2021 Name: Kimberly [name] MRN: [id number] DOB: 16-Mar-1956   Referred by: [name], FNP Reason for referral : Chronic Care Management (Inbound call from patient )  Inbound call received from sister [name] requesting a return phone call.  An unsuccessful telephone outreach was attempted today. The patient was referred to the case management team for assistance with care management and care coordination.   Follow Up Plan: A HIPPA compliant phone message was left for the patient providing contact information and requesting a return call.   Barb Merino, RN, BSN, CCM Care Management Coordinator [place] [facility]  Direct Phone: [phone]

## 2021-09-26 ENCOUNTER — Ambulatory Visit: Payer: Self-pay

## 2021-09-26 ENCOUNTER — Telehealth: Payer: Medicare Other

## 2021-09-26 ENCOUNTER — Other Ambulatory Visit: Payer: Self-pay | Admitting: Nurse Practitioner

## 2021-09-26 DIAGNOSIS — F32A Depression, unspecified: Secondary | ICD-10-CM

## 2021-09-26 DIAGNOSIS — Z720 Tobacco use: Secondary | ICD-10-CM

## 2021-09-26 DIAGNOSIS — I10 Essential (primary) hypertension: Secondary | ICD-10-CM

## 2021-09-26 DIAGNOSIS — R7303 Prediabetes: Secondary | ICD-10-CM

## 2021-09-26 DIAGNOSIS — R928 Other abnormal and inconclusive findings on diagnostic imaging of breast: Secondary | ICD-10-CM

## 2021-09-26 MED ORDER — NICOTINE 21 MG/24HR TD PT24: 21.0000 mg | MEDICATED_PATCH | Freq: Every day | TRANSDERMAL | 3 refills | Status: DC

## 2021-09-26 MED ORDER — VITAMIN D (ERGOCALCIFEROL) 1.25 MG (50000 UNIT) PO CAPS: 50000.0000 [IU] | ORAL_CAPSULE | ORAL | 1 refills | Status: DC

## 2021-09-26 NOTE — Chronic Care Management (AMB) (Signed)
Chronic Care Management   CCM RN Visit Note  09/26/2021 Name: Kimberly [name] MRN: [id number] DOB: [birth year]  Subjective: Kimberly [name] is a 66 y.o. year old female who is a primary care patient of [name], [place]. The care management team was consulted for assistance with disease management and care coordination needs.    Engaged with patient by telephone for follow up visit in response to provider referral for case management and/or care coordination services.   Consent to Services:  The patient was given information about Chronic Care Management services, agreed to services, and gave verbal consent prior to initiation of services.  Please see initial visit note for detailed documentation.   Patient agreed to services and verbal consent obtained.   Assessment: Review of patient past medical history, allergies, medications, health status, including review of consultants reports, laboratory and other test data, was performed as part of comprehensive evaluation and provision of chronic care management services.   SDOH (Social Determinants of Health) assessments and interventions performed:    CCM Care Plan  Allergies  Allergen Reactions   Clonidine Derivatives Other (See Comments)    Patch causes scars   Spironolactone Rash    Outpatient Encounter Medications as of 09/26/2021  Medication Sig   acetaminophen (TYLENOL) 325 MG tablet Take 2 tablets (650 mg total) by mouth every 6 (six) hours as needed for moderate pain, mild pain, fever or headache.   amLODipine (NORVASC) 5 MG tablet Take 1 tablet (5 mg total) by mouth daily.   apixaban (ELIQUIS) 5 MG TABS tablet Take 1 tablet (5 mg total) by mouth 2 (two) times daily.   atorvastatin (LIPITOR) 80 MG tablet TAKE 1 TABLET BY MOUTH EVERY DAY   carvedilol (COREG) 6.25 MG tablet TAKE 1 TABLET BY MOUTH 2 TIMES DAILY WITH A MEAL   docusate sodium (COLACE) 100 MG capsule Take 1 capsule (100 mg total) by mouth 2 (two) times daily as  needed for mild constipation.   doxycycline (VIBRA-TABS) 100 MG tablet TAKE 1 TABLET BY MOUTH EVERY TWELVE HOURS (Patient not taking: Reported on 08/24/2021)   FEROSUL 325 (65 Fe) MG tablet TAKE 1 TABLET BY MOUTH EVERY DAY WITH BREAKFAST   furosemide (LASIX) 20 MG tablet Take 20 mg by mouth daily x 3 days (Patient not taking: Reported on 08/24/2021)   hydrocortisone cream 1 % Apply 1 application topically daily as needed for itching.   mirtazapine (REMERON) 15 MG tablet TAKE 1 TABLET BY MOUTH AT BEDTIME   Multiple Vitamin (MULTIVITAMIN) capsule Take 1 capsule by mouth daily.   nicotine (NICODERM CQ - DOSED IN MG/24 HOURS) 21 mg/24hr patch Place 1 patch (21 mg total) onto the skin daily.   pantoprazole (PROTONIX) 40 MG tablet TAKE 1 TABLET BY MOUTH EVERY DAY   Tiotropium Bromide-Olodaterol (STIOLTO RESPIMAT) 2.5-2.5 MCG/ACT AERS Inhale 2 puffs into the lungs daily.   traMADol (ULTRAM) 50 MG tablet Take 1 tablet (50 mg total) by mouth every 6 (six) hours as needed. (Patient taking differently: Take 50 mg by mouth every 6 (six) hours as needed for moderate pain.)   Vitamin D, Ergocalciferol, (DRISDOL) 1.25 MG (50000 UNIT) CAPS capsule Take 1 capsule (50,000 Units total) by mouth every 7 (seven) days.   No facility-administered encounter medications on file as of 09/26/2021.    Patient Active Problem List   Diagnosis Date Noted   Pulmonary nodule 01/25/2021   Shortness of breath 01/25/2021   Sepsis ([place]) 12/18/2020   Family history of malignant  neoplasm of gastrointestinal tract 12/07/2020   Personal history of colonic polyps 12/07/2020   Secondary hypercoagulable state ([place]) 10/14/2020   Right sided abdominal pain    Abdominal wall pain    Atrial fibrillation ( [place]) 08/19/2020   Educated about COVID-19 virus infection 11/16/2019   Urinary frequency 04/14/2019   Cough 04/14/2019   AKI (acute kidney injury) ([place]) 11/26/2018   Diarrhea 11/25/2018   Dehydration, mild 11/25/2018   Hemiparesis  affecting nondominant 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 ([place]) 07/31/2018   Muscle ache 07/31/2018   Prediabetes 07/31/2018   Chronic gout without tophus 07/31/2018   Vision loss 07/31/2018   Malnutrition of moderate degree 02/15/2018   Carotid occlusion, bilateral    Atrial fibrillation with RVR ([place]) 02/13/2018   Positive D dimer 02/13/2018   Back pain 02/13/2018   CVA (cerebral vascular accident) ([place]) 02/13/2018   Paroxysmal atrial fibrillation ([place]) 02/13/2018   Coronary artery disease involving native coronary artery of native heart without angina pectoris 02/13/2018   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[facility]) 02/13/2018   Aortic atherosclerosis ([place]) 02/13/2018   CAD, ARTERY BYPASS GRAFT 05/03/2009   TOBACCO USE, QUIT 05/03/2009   ROTATOR CUFF SYNDROME, LEFT 02/12/2009   Shoulder pain, left 11/03/2008   HAMMER TOE, ACQUIRED 02/11/2008   INSECT BITE, LEG 02/11/2008   Cerebral artery occlusion with cerebral infarction ([place]) 12/05/2007   Dyslipidemia 11/05/2007   UNSPECIFIED DISORDER TEETH&SUPPORTING STRUCTURES 11/05/2007   BICEPS TENDINITIS, RIGHT 04/24/2007   Essential hypertension 04/23/2007   MICROCYTOSIS 04/23/2007    Conditions to be addressed/monitored: Essential hypertension, Prediabetes, Depression   Care Plan : RN Care Manager Plan of Care  Updates made by Lynne Logan, RN since 09/26/2021 12:00 AM     Problem: No Plan of Care established for management of chronic disease states (Essential hypertension, Prediabetes, Depression)   Priority: High     Long-Range Goal: Establishment of plan of care for management of chronic disease states (Essential hypertension, Prediabetes, Depression)   Start Date: 09/20/2021  Expected End Date: 09/20/2022  Recent Progress: On track  Priority: High  Note:   Current Barriers:  Knowledge Deficits related to plan of care for management of Essential hypertension,  Prediabetes, Depression   Chronic Disease Management support and education needs related to Essential hypertension, Prediabetes, Depression    RNCM Clinical Goal(s):  Patient will verbalize basic understanding of  Essential hypertension, Prediabetes, Depression  disease process and self health management plan as evidenced by patient will report having no disease exacerbations related to her chronic disease states as listed above take all medications exactly as prescribed and will call provider for medication related questions as evidenced by demonstrate improved understanding of prescribed medications and rationale for usage as evidenced by patient teach back continue to work with RN Care Manager to address care management and care coordination needs related to  Essential hypertension, Prediabetes, Depression  as evidenced by adherence to CM Team Scheduled appointments demonstrate ongoing self health care management ability   as evidenced by    through collaboration with RN Care manager, provider, and care team.   Interventions: 1:1 collaboration with primary care provider regarding development and update of comprehensive plan of care as evidenced by provider attestation and co-signature Inter-disciplinary care team collaboration (see longitudinal plan of care) Evaluation of current treatment plan related to  self management and patient's adherence to plan as established by provider  Diabetes Interventions:  (Status:  Condition stable.  Not addressed  this visit.) Long Term Goal Assessed patient's understanding of A1c goal:  <5.7% Provided education to patient about basic DM disease process Counseled on importance of regular laboratory monitoring as prescribed Review of patient status, including review of consultants reports, relevant laboratory and other test results, and medications completed Assessed social determinant of health barriers Mailed printed educational materials related to Carb  Choices, Meal Planning and How to Perform Chair Exercises  Discussed plans with patient for ongoing care management follow up and provided patient with direct contact information for care management team Lab Results  Component Value Date   HGBA1C 6.0 (H) 08/10/2021  Depression Interventions:  (Status:  Condition stable.  Not addressed this visit.)  Long Term Goal Evaluation of current treatment plan related to Depression, self-management and patient's adherence to plan as established by provider Provided education to patient/caregiver about basic disease process related to Depression  Review of patient status, including review of consultant's reports, relevant laboratory and other test results, and medications completed. Reviewed medications with patient and discussed importance of medication adherence  Determined patient's cognition and mood are stable Discussed patient's daughter is currently taking time off work and is staying with Ms. Pizana to help care for her, this has improved patient's mood Discussed the family is visiting patient more often and taking her for Sunday drives as well as getting her out of the house more often for dinner dates Encouraged sister [name] to continue to monitor patient's condition and notify the CCM team and the PCP of any/all concerns  Discussed plans with patient for ongoing care management follow up and provided patient with direct contact information for care management team  Hypertension Interventions:  (Status:  Goal on track:  Yes.) Long Term Goal Last practice recorded BP readings:  BP Readings from Last 3 Encounters:  08/24/21 (!) 70/60  08/10/21 122/80  05/04/21 105/66  Most recent eGFR/CrCl:  Lab Results  Component Value Date   EGFR 68 08/10/2021    No components found for: CRCL Completed successful telephonic follow up call with sister and caregiver [name] Evaluation of current treatment plan related to hypertension self management and  patient's adherence to plan as established by provider Reviewed medications with sister [name] and discussed importance of compliance, reviewed and discussed recent medication changes and assessed for knowledge and understanding  Advised sister, providing education and rationale, to monitor patient's blood pressure daily and record, calling PCP for findings outside established parameters Provided education on prescribed diet low Sodium Mailed printed educational materials related to How to Accurately Monitor BP at Home  Discussed plans with patient for ongoing care management follow up and provided patient with direct contact information for care management team  Vitamin D deficiency Interventions:  (Status:  Goal on track:  Yes.)  Long Term Goal Evaluation of current treatment plan related to Vitamin D deficiency self-management and patient's adherence to plan as established by provider Review of patient status, including review of consultant's reports, relevant laboratory and other test results, and medications completed Reviewed medications with caregiver, including the new Rx for Vitamin D and discussed importance of medication adherence Educated sister [name] on ways to improve Vitamin D, take supplement exactly as prescribed, eat a Vitamin D rich diet, try to get at least 15 minutes of natural sunlight daily when possible  Discussed plans with patient for ongoing care management follow up and provided patient with direct contact information for care management team Vit D, 25-Hydroxy 30.0 - 100.0 ng/mL 15.6 Low  Completed inbound call with sister [name] to advise the new Rx is needed for the increased dose of Vitamin D Determined the pharmacy did not receive the new Rx from PCP and patient is only taking Vitamin D 50,000 once weekly Sent in basket message to PCP provider [name] FNP requesting a new Rx for the correct dosage be sent to patient's preferred pharmacy Sent in basket  message to embedded Pharm D requesting information for cost of pill packaging via [place]  Discussed plans with patient for ongoing care management follow up and provided patient with direct contact information for care management team   Smoking Cessation Interventions:  (Status:  New goal.)  Long Term Goal Evaluation of current treatment plan related to  Smoking Cessation , self-management and patient's adherence to plan as established by provider Completed inbound call with sister [name] regarding patient's wish to quit smoking Determined Dr. Percival Spanish, Cardiologist ordered Nicotine patches for patient sent to [facility], per sister [name] this pharmacy did not receive the Rx for Nicotine patches, she has not received a return call from this office to discuss and request the Rx be resent Sent secure message to PCP provider [name] FNP requesting a new Rx be sent to patient's preferred pharmacy for Nicotine patches Discussed plans with patient for ongoing care management follow up and provided patient with direct contact information for care management team   Patient Goals/Self-Care Activities: Take all medications as prescribed Attend all scheduled provider appointments Call pharmacy for medication refills 3-7 days in advance of running out of medications Call provider office for new concerns or questions  drink 6 to 8 glasses of water each day fill half of plate with vegetables manage portion size write blood pressure results in a log or diary take blood pressure log to all doctor appointments call doctor for signs and symptoms of high blood pressure take medications for blood pressure exactly as prescribed report new symptoms to your doctor Start Nicotine patches once obtained from pharmacy Increase Vitamin D3 to 50,000 iu twice weekly as directed by PCP  Follow Up Plan:  Telephone follow up appointment with care management team member scheduled for:  12/16/21        Plan:Telephone follow up appointment with care management team member scheduled for:  12/16/21  Barb Merino, RN, BSN, CCM Care Management Coordinator [place] [facility]  Direct Phone: [phone]

## 2021-09-26 NOTE — Patient Instructions (Signed)
Visit Information  Thank you for taking time to visit with me today. Please don't hesitate to contact me if I can be of assistance to you before our next scheduled telephone appointment.  Following are the goals we discussed today:  (Copy and paste patient goals from clinical care plan here)  Our next appointment is by telephone on 12/16/21 at 12 noon  Please call the care guide team at [phone] if you need to cancel or reschedule your appointment.   If you are experiencing a Mental Health or [place] or need someone to talk to, please call 1-800-273-TALK (toll free, 24 hour hotline)   The patient verbalized understanding of instructions, educational materials, and care plan provided today and agreed to receive a mailed copy of patient instructions, educational materials, and care plan.   Barb Merino, RN, BSN, CCM Care Management Coordinator [place] [facility]  Direct Phone: [phone]

## 2021-09-27 ENCOUNTER — Telehealth: Payer: Self-pay

## 2021-09-27 NOTE — Chronic Care Management (AMB) (Addendum)
° ° °  Chronic Care Management Pharmacy Assistant   Name: Kimberly [name]  MRN: [id number] DOB: [birth year]   Reason for Encounter: Insurance follow up    Medications: Outpatient Encounter Medications as of 09/27/2021  Medication Sig   acetaminophen (TYLENOL) 325 MG tablet Take 2 tablets (650 mg total) by mouth every 6 (six) hours as needed for moderate pain, mild pain, fever or headache.   amLODipine (NORVASC) 5 MG tablet Take 1 tablet (5 mg total) by mouth daily.   apixaban (ELIQUIS) 5 MG TABS tablet Take 1 tablet (5 mg total) by mouth 2 (two) times daily.   atorvastatin (LIPITOR) 80 MG tablet TAKE 1 TABLET BY MOUTH EVERY DAY   carvedilol (COREG) 6.25 MG tablet TAKE 1 TABLET BY MOUTH 2 TIMES DAILY WITH A MEAL   docusate sodium (COLACE) 100 MG capsule Take 1 capsule (100 mg total) by mouth 2 (two) times daily as needed for mild constipation.   doxycycline (VIBRA-TABS) 100 MG tablet TAKE 1 TABLET BY MOUTH EVERY TWELVE HOURS (Patient not taking: Reported on 08/24/2021)   FEROSUL 325 (65 Fe) MG tablet TAKE 1 TABLET BY MOUTH EVERY DAY WITH BREAKFAST   furosemide (LASIX) 20 MG tablet Take 20 mg by mouth daily x 3 days (Patient not taking: Reported on 08/24/2021)   hydrocortisone cream 1 % Apply 1 application topically daily as needed for itching.   mirtazapine (REMERON) 15 MG tablet TAKE 1 TABLET BY MOUTH AT BEDTIME   Multiple Vitamin (MULTIVITAMIN) capsule Take 1 capsule by mouth daily.   nicotine (NICODERM CQ - DOSED IN MG/24 HOURS) 21 mg/24hr patch Place 1 patch (21 mg total) onto the skin daily.   pantoprazole (PROTONIX) 40 MG tablet TAKE 1 TABLET BY MOUTH EVERY DAY   Tiotropium Bromide-Olodaterol (STIOLTO RESPIMAT) 2.5-2.5 MCG/ACT AERS Inhale 2 puffs into the lungs daily.   traMADol (ULTRAM) 50 MG tablet Take 1 tablet (50 mg total) by mouth every 6 (six) hours as needed. (Patient taking differently: Take 50 mg by mouth every 6 (six) hours as needed for moderate pain.)   Vitamin D,  Ergocalciferol, (DRISDOL) 1.25 MG (50000 UNIT) CAPS capsule Take 1 capsule (50,000 Units total) by mouth 2 (two) times a week.   No facility-administered encounter medications on file as of 09/27/2021.    09-27-2021: Was instructed to call patient's new insurance to see if [facility] was preferred. Looked in patient's chart and saw a medicaid card, [employer] prescription drug and medicare. Contacted [employer] and was told patient no longer has coverage. Contacted patient's sister Kimberly [name] and was told patient only has medicaid and medicare. Updated [name] PTM. Patient's sister stated she would only switch to [facility] if patient doesn't have a copay.  [name] Pharmacist Assistant [phone]

## 2021-09-28 NOTE — Progress Notes (Unsigned)
Cardiology Office Note:    Date:  09/29/2021   ID:  Kimberly [name], DOB 26-Oct-1955, MRN [id number]  PCP:  [name], [place] Prov[facility] Cardiologist:  [name], MD { Click to update primary MD,subspecialty MD or APP then REFRESH:1}    Referring MD: [name], FNP   Follow-up for atrial fibrillation  History of Present Illness:    Kimberly [name] is a 66 y.o. female with a hx of hypertrophic cardiomyopathy, essential hypertension, coronary artery disease, atrial fibrillation with RVR, CVA, ascending aortic dilation, AKI, dyslipidemia, dehydration, and abdominal wall pain.  She is status post CABG x3 2008.  She had a CVA with PCA subacute infarct in 2019.  She was seen by Dr. Percival Spanish and complained of progressive weight loss and shortness of breath.  She was sent for a nuclear stress test with demonstrated previous infarct but no ischemia.   She was seen again by Dr. Percival Spanish on 07/28/2020.  During that time she denied chest pain, pressure, arm and neck discomfort.  She did report occasional palpitations but denied presyncope and syncope.  She denied weight gain and edema.  She continued to smoke.  She reported dyspnea with exertion.  Due to previous CVA she mainly used a wheelchair to get around but occasionally used a walker.  She denied PND and orthopnea.     She presented to the emergency department on 08/19/2020 and was discharged on 08/22/2020.  On presentation she complained of right upper quadrant and right lower quadrant pain that was associated with intermittent periods of nausea and vomiting.  She denied diarrhea but reported constipation.  She was found to be in atrial fibrillation with RVR.  She converted to sinus rhythm 08/20/2020 and her Cardizem GTT was discontinued and she was started on Lopressor 25 mg daily.  She was also continued on her apixaban.  Her TSH was normal and her echocardiogram 08/19/2020 showed an LVEF of 65 to 70%.  It was felt that  her GI pain secondary to abdominal wall pain versus IBS.  She was started on Bentyl and had improvement in her symptoms.  She was tolerating her diet well was no longer constipated and was discharged in stable condition on 08/22/2020.   She presented to the clinic 09/30/2020 for follow-up evaluation stated she did note increased shortness of breath with increased physical activity.  She did not notice any chest pain and had mild fatigue.  Her daughter reported that she would wake up at 3 AM and then goe to bed early afternoon or late morning.  She denied bleeding issues.  She had started smoking again but was trying to eat a low-salt diet.  We reviewed her echocardiogram.  She reported compliance with apixaban.  Her caregiver was also present and stated that she  had the patient's medications placed in pill packs for easier distribution.  We used shared decision making to discuss DCCV.  Pulse on EKG showed 133 bpm and on recheck pulse was 98 bpm.  I will referred her to the atrial fibrillation clinic and increased her metoprolol.  I asked her to stop smoking and avoid triggers.  I will gave her the salty 6 diet sheet, planned follow-up with atrial fibrillation clinic and Dr. Percival Spanish in 3 months.  She was seen at the atrial fibrillation clinic 10/14/2020.  CHA2DS2-VASc score 5.  During that time she reported intermittent lightheadedness but was cardiac unaware.  She reported being very sedentary.  Her heart rate had  improved to 110s.  She reported compliance with anticoagulation.  She was scheduled for DCCV.  Rate control versus medical management were also discussed.  She did not undergo cardioversion due to complications with other medical problems.  She presented to the emergency department 12/18/2020 after having following.  Her EKG 08/25/2021 showed sinus rhythm with occasional PVCs 84 bpm.  She was seen in follow-up by Dr. Percival Spanish on 08/24/2021.  During that time her hydralazine was reduced due to hypotension.   She was limited in her mobility due to her CVA.  She was able to get out of bed with assistance.  She was able to ambulate with a walker.  She denied chest pressure, arm neck and back discomfort.  She denied shortness of breath PND and orthopnea.  She denied palpitations and cardiac awareness.  She was tolerating her anticoagulation well.  She presents to the clinic today for follow-up evaluation states***   Today she denies chest pain, shortness of breath, lower extremity edema, fatigue, palpitations, melena, hematuria, hemoptysis, diaphoresis, weakness, presyncope, syncope, orthopnea, and PND.  Past Medical History:  Diagnosis Date   Bicipital tenosynovitis    Coronary artery disease 05/2007   [id number]    Fall 02/13/2018   Family history of ischemic heart disease    Gout    Hammer toe    Hip, thigh, leg, and ankle, insect bite, nonvenomous, without mention of infection(916.4)    Hyperlipidemia    Hypertension    Other abnormality of red blood cells    Rotator cuff syndrome of left shoulder    Stroke [facility])     Past Surgical History:  Procedure Laterality Date   CARDIAC CATHETERIZATION     COLONOSCOPY WITH PROPOFOL N/A 01/30/2020   Procedure: COLONOSCOPY WITH PROPOFOL;  Surgeon: Carol Ada, MD;  Location: [facility];  Service: Endoscopy;  Laterality: N/A;   CORONARY ARTERY BYPASS GRAFT     triple   CORONARY ARTERY BYPASS GRAFT  2008   POLYPECTOMY  01/30/2020   Procedure: POLYPECTOMY;  Surgeon: Carol Ada, MD;  Location: [facility];  Service: Endoscopy;;   TUBAL LIGATION      Current Medications: No outpatient medications have been marked as taking for the 09/30/21 encounter (Appointment) with Deberah Pelton, NP.     Allergies:   Clonidine derivatives and Spironolactone   Social History   Socioeconomic History   Marital status: Single    Spouse name: Not on file   Number of children: 2   Years of education: Not on file   Highest education level: 11th grade   Occupational History   Occupation: retired  Tobacco Use   Smoking status: Former    Packs/day: 2.00    Years: 35.00    Pack years: 70.00    Types: Cigarettes    Quit date: 08/12/2019    Years since quitting: 2.1   Smokeless tobacco: Former    Quit date: 08/09/2008   Tobacco comments:    Stopped smoking in May 2022 [facility]   Vaping Use   Vaping Use: Never used  Substance and Sexual Activity   Alcohol use: Not Currently    Comment: ocassionally wine   Drug use: Not Currently    Types: Marijuana    Comment: history of cocaine use   Sexual activity: Not on file  Other Topics Concern   Not on file  Social History Narrative   Patient is right-handed. She lives alone in a one level home, 2 steps to enter. Her family is very  active in her care.    Social Determinants of Health   Financial Resource Strain: Not on file  Food Insecurity: Not on file  Transportation Needs: Not on file  Physical Activity: Not on file  Stress: Not on file  Social Connections: Not on file     Family History: The patient's ***family history includes Anxiety disorder in her sister; Diabetes in her father; Heart attack in her father; Hypertension in her mother; Kidney failure in her father; Sarcoidosis in her sister.  ROS:   Please see the history of present illness.    *** All other systems reviewed and are negative.   Risk Assessment/Calculations:   {Does this patient have ATRIAL FIBRILLATION?:[phone]}       Physical Exam:    VS:  There were no vitals taken for this visit.    Wt Readings from Last 3 Encounters:  08/24/21 117 lb 9.6 oz (53.3 kg)  08/10/21 119 lb 3.2 oz (54.1 kg)  02/10/21 118 lb 12.8 oz (53.9 kg)     GEN: *** Well nourished, well developed in no acute distress HEENT: Normal NECK: No JVD; No carotid bruits LYMPHATICS: No lymphadenopathy CARDIAC: ***RRR, no murmurs, rubs, gallops RESPIRATORY:  Clear to auscultation without rales, wheezing or rhonchi  ABDOMEN: Soft,  non-tender, non-distended MUSCULOSKELETAL:  No edema; No deformity  SKIN: Warm and dry NEUROLOGIC:  Alert and oriented x 3 PSYCHIATRIC:  Normal affect    EKGs/Labs/Other Studies Reviewed:    The following studies were reviewed today:  Echocardiogram 08/19/2020   IMPRESSIONS     1. Left ventricular ejection fraction, by estimation, is 65 to 70%. The  left ventricle has normal function. The left ventricle has no regional  wall motion abnormalities. There is mild concentric left ventricular  hypertrophy. Left ventricular diastolic  function could not be evaluated.   2. Right ventricular systolic function is normal. The right ventricular  size is normal. There is normal pulmonary artery systolic pressure. The  estimated right ventricular systolic pressure is 36.1 mmHg.   3. Left atrial size was moderately dilated.   4. Right atrial size was mildly dilated.   5. The mitral valve is normal in structure. Mild mitral valve  regurgitation. No evidence of mitral stenosis.   6. Tricuspid valve regurgitation is mild to moderate.   7. The aortic valve is normal in structure. Aortic valve regurgitation is  not visualized. No aortic stenosis is present.   8. Aortic dilatation noted. There is mild dilatation of the ascending  aorta, measuring 42 mm.   9. The inferior vena cava is normal in size with greater than 50%  respiratory variability, suggesting right atrial pressure of 3 mmHg.  EKG:  EKG is *** ordered today.  The ekg ordered today demonstrates ***  EKG 09/30/2020 atrial fibrillation 133 bpm ST and T wave abnormality consider inferior lateral ischemia  Recent Labs: 01/04/2021: BNP 585.7; Magnesium 2.1 08/10/2021: ALT 19; BUN 15; Creatinine, Ser 0.93; Hemoglobin 11.8; Platelets 317; Potassium 3.7; Sodium 142  Recent Lipid Panel    Component Value Date/Time   CHOL 124 08/10/2021 1208   TRIG 79 08/10/2021 1208   HDL 49 08/10/2021 1208   CHOLHDL 2.5 08/10/2021 1208   CHOLHDL 3.6  02/14/2018 0314   VLDL 16 02/14/2018 0314   LDLCALC 59 08/10/2021 1208    ASSESSMENT & PLAN     Paroxysmal atrial fibrillation-heart rate today***.    CHA2DS2-VASc score 5.  Cardiac unaware.  Continue Increase carvedilol Continue apixaban, aspirin Heart healthy  low-sodium diet-salty 6 given Increase physical activity as tolerated   Essential hypertension-BP today 118/72.  Well-controlled at home. Continue amlodipine, carvedilol Heart healthy low-sodium diet-salty 6 given Increase physical activity as tolerated   Carotid artery stenosis-occluded bilateral ICA's 03/23/2020.  Medical management recommended Continue atorvastatin, aspirin Heart healthy low-sodium diet-salty 6 given Increase physical activity as tolerated   Hyperlipidemia-LDL *** Continue atorvastatin, aspirin Heart healthy low-sodium high-fiber diet Increase physical activity as tolerated     Disposition: Follow-up with Dr. Percival Spanish in 4-6 months.    {Are you ordering a CV Procedure (e.g. stress test, cath, DCCV, TEE, etc)?   Press F2        :[id number]}    Medication Adjustments/Labs and Tests Ordered: Current medicines are reviewed at length with the patient today.  Concerns regarding medicines are outlined above.  No orders of the defined types were placed in this encounter.  No orders of the defined types were placed in this encounter.   There are no Patient Instructions on file for this visit.   Signed, Deberah Pelton, NP  09/29/2021 7:09 AM      Notice: This dictation was prepared with Dragon dictation along with smaller phrase technology. Any transcriptional errors that result from this process are unintentional and may not be corrected upon review.  I spent***minutes examining this patient, reviewing medications, and using patient centered shared decision making involving her cardiac care.  Prior to her visit I spent greater than 20 minutes reviewing her past medical history,  medications, and  prior cardiac tests.

## 2021-09-30 ENCOUNTER — Ambulatory Visit (HOSPITAL_BASED_OUTPATIENT_CLINIC_OR_DEPARTMENT_OTHER): Payer: Commercial Managed Care - HMO | Admitting: General Practice

## 2021-10-07 ENCOUNTER — Other Ambulatory Visit: Payer: Self-pay | Admitting: Nurse Practitioner

## 2021-10-07 MED ORDER — ALENDRONATE SODIUM 70 MG PO TABS: 70.0000 mg | ORAL_TABLET | ORAL | 1 refills | Status: DC

## 2021-10-10 ENCOUNTER — Other Ambulatory Visit: Payer: Self-pay | Admitting: Nurse Practitioner

## 2021-10-10 DIAGNOSIS — R630 Anorexia: Secondary | ICD-10-CM

## 2021-10-12 ENCOUNTER — Telehealth: Payer: Self-pay

## 2021-10-12 ENCOUNTER — Telehealth: Payer: Medicaid Other

## 2021-10-12 NOTE — Chronic Care Management (AMB) (Addendum)
? ? ?Chronic Care Management ?Pharmacy Assistant  ? ?Name: Kimberly [name]  MRN: [id number] DOB: October 07, 1955 ? ?Reason for Encounter: Disease State/ Hypertension ? ? ?Recent office visits:  ?09-26-2021 Little, Claudette Stapler, RN (CCM) ? ?09-20-2021 Little, Claudette Stapler, RN (CCM) ? ?Recent consult visits:  ?None ? ?Hospital visits:  ?None in previous 6 months ? ?Medications: ?Outpatient Encounter Medications as of 10/12/2021  ?Medication Sig  ? acetaminophen (TYLENOL) 325 MG tablet Take 2 tablets (650 mg total) by mouth every 6 (six) hours as needed for moderate pain, mild pain, fever or headache.  ? alendronate (FOSAMAX) 70 MG tablet Take 1 tablet (70 mg total) by mouth every 7 (seven) days. Take with a full glass of water on an empty stomach.  ? amLODipine (NORVASC) 5 MG tablet Take 1 tablet (5 mg total) by mouth daily.  ? apixaban (ELIQUIS) 5 MG TABS tablet Take 1 tablet (5 mg total) by mouth 2 (two) times daily.  ? atorvastatin (LIPITOR) 80 MG tablet TAKE 1 TABLET BY MOUTH EVERY DAY  ? carvedilol (COREG) 6.25 MG tablet TAKE 1 TABLET BY MOUTH 2 TIMES DAILY WITH A meal  ? docusate sodium (COLACE) 100 MG capsule Take 1 capsule (100 mg total) by mouth 2 (two) times daily as needed for mild constipation.  ? doxycycline (VIBRA-TABS) 100 MG tablet TAKE 1 TABLET BY MOUTH EVERY TWELVE HOURS (Patient not taking: Reported on 08/24/2021)  ? FEROSUL 325 (65 Fe) MG tablet TAKE 1 TABLET BY MOUTH EVERY DAY WITH BREAKFAST  ? furosemide (LASIX) 20 MG tablet Take 20 mg by mouth daily x 3 days (Patient not taking: Reported on 08/24/2021)  ? hydrocortisone cream 1 % Apply 1 application topically daily as needed for itching.  ? mirtazapine (REMERON) 15 MG tablet TAKE 1 TABLET BY MOUTH AT BEDTIME  ? Multiple Vitamin (MULTIVITAMIN) capsule Take 1 capsule by mouth daily.  ? nicotine (NICODERM CQ - DOSED IN MG/24 HOURS) 21 mg/24hr patch Place 1 patch (21 mg total) onto the skin daily.  ? pantoprazole (PROTONIX) 40 MG tablet TAKE 1 TABLET BY MOUTH EVERY  DAY  ? Tiotropium Bromide-Olodaterol (STIOLTO RESPIMAT) 2.5-2.5 MCG/ACT AERS Inhale 2 puffs into the lungs daily.  ? traMADol (ULTRAM) 50 MG tablet Take 1 tablet (50 mg total) by mouth every 6 (six) hours as needed. (Patient taking differently: Take 50 mg by mouth every 6 (six) hours as needed for moderate pain.)  ? Vitamin D, Ergocalciferol, (DRISDOL) 1.25 MG (50000 UNIT) CAPS capsule Take 1 capsule (50,000 Units total) by mouth 2 (two) times a week.  ? ?No facility-administered encounter medications on file as of 10/12/2021.  ?Reviewed chart prior to disease state call. Spoke with patient's sister  regarding BP ? ?Recent Office Vitals: ?BP Readings from Last 3 Encounters:  ?08/24/21 (!) 70/60  ?08/10/21 122/80  ?05/04/21 105/66  ? ?Pulse Readings from Last 3 Encounters:  ?08/24/21 (!) 114  ?08/10/21 96  ?05/04/21 81  ?  ?Wt Readings from Last 3 Encounters:  ?08/24/21 117 lb 9.6 oz (53.3 kg)  ?08/10/21 119 lb 3.2 oz (54.1 kg)  ?02/10/21 118 lb 12.8 oz (53.9 kg)  ?  ? ?Kidney Function ?Lab Results  ?Component Value Date/Time  ? CREATININE 0.93 08/10/2021 12:08 PM  ? CREATININE 0.87 01/04/2021 04:38 PM  ? CREATININE 0.98 05/04/2014 10:47 AM  ? GFRNONAA 57 (L) 12/21/2020 02:51 AM  ? GFRAA >60 04/27/2020 02:27 PM  ? ? ?BMP Latest Ref Rng & Units 08/10/2021 01/04/2021 12/21/2020  ?Glucose 70 -  99 mg/dL 66(L) 99 141(H)  ?BUN 8 - 27 mg/dL 15 23 17   ?Creatinine 0.57 - 1.00 mg/dL 0.93 0.87 1.09(H)  ?BUN/Creat Ratio 12 - 28 16 26  -  ?Sodium 134 - 144 mmol/L 142 140 135  ?Potassium 3.5 - 5.2 mmol/L 3.7 4.4 3.8  ?Chloride 96 - 106 mmol/L 105 103 108  ?CO2 20 - 29 mmol/L 22 18(L) 21(L)  ?Calcium 8.7 - 10.3 mg/dL 9.6 9.4 8.5(L)  ? ? ?Current antihypertensive regimen:  ?Amlodipine 5 mg daily ?Carvedilol 6.25 mg taking 1 tablet by mouth 2 times daily ? ?How often are you checking your Blood Pressure? 1-2x per week ? ?Current home BP readings: 120/84, 110/80 ? ?What recent interventions/DTPs have been made by any provider to improve  Blood Pressure control since last CPP Visit:  ?Educated on Daily salt intake goal < 2300 mg; ?Exercise goal of 150 minutes per week; ?Importance of home blood pressure monitoring; ?-Counseled to monitor BP at home at least four times per week, document, and provide log at future appointments ?-Recommended to continue current medication ? ?Any recent hospitalizations or ED visits since last visit with CPP? No ? ?What diet changes have been made to improve Blood Pressure Control?  ?Patient's sister states patient drinks 4-5 bottles of water daily. Patient's appetite has improved  and family makes sure she limits her salt intake ? ?What exercise is being done to improve your Blood Pressure Control?  ?Patients sister stated she is doing better walking daily around the house. ? ?Adherence Review: ?Is the patient currently on ACE/ARB medication? No ?Does the patient have >5 day gap between last estimated fill dates? No ? ?NOTES: ?Patient's sister stated patient has cut down from 4 to 2 cigarettes daily and family is continuing to encourage patient to quit. Patient's sister also stated patient isn't doing well taking fosamax on an empty stomach do to nausea. Sent message to [name] FNP and [name] CPP. ? ?Care Gaps: ?PNA Vac overdue ?Tdap overdue ?3rd moderna vaccine overdue ?Shingrix overdue ? ?Star Rating Drugs: ?Atorvastatin 80 mg- Last filled 10-10-2021 [facility]. ? ?Jeannette How CMA ?Clinical Pharmacist Assistant ?[phone] ? ?Pharm D Follow up 10/12/2021 : ?Spoke with Ms. Coudriet daughter, and she is moving back to [place] to take care of her mother.  She is trying to make sure that she is taking her medication properly. She reports that right now, she is on lunch break so she is able to speak at this time. She is going to make sure she gets her medication before she leaves for work. Her caregiver comes in at 36. She is going to give her some boost or Ensure, 30 minutes after taking the Fosamax.  She is also checking her BP at least twice per week and placing it on a calendar.  ? ?[name], CPP, PharmD ?Clinical Pharmacist Practitioner ?[facility] ?[phone]  ? ?

## 2021-10-20 ENCOUNTER — Ambulatory Visit
Admission: RE | Admit: 2021-10-20 | Discharge: 2021-10-20 | Disposition: A | Discharge status: Home / Self Care | Payer: Medicare Other | Source: Ambulatory Visit | Attending: Nurse Practitioner | Admitting: Nurse Practitioner

## 2021-10-20 DIAGNOSIS — R928 Other abnormal and inconclusive findings on diagnostic imaging of breast: Secondary | ICD-10-CM

## 2021-10-20 DIAGNOSIS — R922 Inconclusive mammogram: Secondary | ICD-10-CM | POA: Diagnosis not present

## 2021-10-20 NOTE — Progress Notes (Unsigned)
Cardiology Office Note:    Date:  10/20/2021   ID:  Kimberly [name], DOB 03-04-1956, MRN [id number]  PCP:  [name], [place] [facility] Cardiologist:  [name], MD { Click to update primary MD,subspecialty MD or APP then REFRESH:1}    Referring MD: [name], FNP   Follow-up for atrial fibrillation  History of Present Illness:    Kimberly [name] is a 66 y.o. female with a hx of hypertrophic cardiomyopathy, essential hypertension, coronary artery disease, atrial fibrillation with RVR, CVA, ascending aortic dilation, AKI, dyslipidemia, dehydration, and abdominal wall pain.  She is status post CABG x3 2008.  She had a CVA with PCA subacute infarct in 2019.  She was seen by Dr. Percival Spanish and complained of progressive weight loss and shortness of breath.  She was sent for a nuclear stress test with demonstrated previous infarct but no ischemia.   She was seen again by Dr. Percival Spanish on 07/28/2020.  During that time she denied chest pain, pressure, arm and neck discomfort.  She did report occasional palpitations but denied presyncope and syncope.  She denied weight gain and edema.  She continued to smoke.  She reported dyspnea with exertion.  Due to previous CVA she mainly used a wheelchair to get around but occasionally used a walker.  She denied PND and orthopnea.     She presented to the emergency department on 08/19/2020 and was discharged on 08/22/2020.  On presentation she complained of right upper quadrant and right lower quadrant pain that was associated with intermittent periods of nausea and vomiting.  She denied diarrhea but reported constipation.  She was found to be in atrial fibrillation with RVR.  She converted to sinus rhythm 08/20/2020 and her Cardizem GTT was discontinued and she was started on Lopressor 25 mg daily.  She was also continued on her apixaban.  Her TSH was normal and her echocardiogram 08/19/2020 showed an LVEF of 65 to 70%.  It was felt that her  GI pain secondary to abdominal wall pain versus IBS.  She was started on Bentyl and had improvement in her symptoms.  She was tolerating her diet well was no longer constipated and was discharged in stable condition on 08/22/2020.   She presented to the clinic 09/30/2020 for follow-up evaluation stated she did note increased shortness of breath with increased physical activity.  She did not notice any chest pain and had mild fatigue.  Her daughter reported that she would wake up at 3 AM and then goe to bed early afternoon or late morning.  She denied bleeding issues.  She had started smoking again but was trying to eat a low-salt diet.  We reviewed her echocardiogram.  She reported compliance with apixaban.  Her caregiver was also present and stated that she  had the patient's medications placed in pill packs for easier distribution.  We used shared decision making to discuss DCCV.  Pulse on EKG showed 133 bpm and on recheck pulse was 98 bpm.  I will referred her to the atrial fibrillation clinic and increased her metoprolol.  I asked her to stop smoking and avoid triggers.  I will gave her the salty 6 diet sheet, planned follow-up with atrial fibrillation clinic and Dr. Percival Spanish in 3 months.  She was seen at the atrial fibrillation clinic 10/14/2020.  CHA2DS2-VASc score 5.  During that time she reported intermittent lightheadedness but was cardiac unaware.  She reported being very sedentary.  Her heart rate had  improved to 110s.  She reported compliance with anticoagulation.  She was scheduled for DCCV.  Rate control versus medical management were also discussed.  She did not undergo cardioversion due to complications with other medical problems.  She presented to the emergency department 12/18/2020 after having following.  Her EKG 08/25/2021 showed sinus rhythm with occasional PVCs 84 bpm.  She was seen in follow-up by Dr. Percival Spanish on 08/24/2021.  During that time her hydralazine was reduced due to hypotension.  She  was limited in her mobility due to her CVA.  She was able to get out of bed with assistance.  She was able to ambulate with a walker.  She denied chest pressure, arm neck and back discomfort.  She denied shortness of breath PND and orthopnea.  She denied palpitations and cardiac awareness.  She was tolerating her anticoagulation well.  She presents to the clinic today for follow-up evaluation states***   Today she denies chest pain, shortness of breath, lower extremity edema, fatigue, palpitations, melena, hematuria, hemoptysis, diaphoresis, weakness, presyncope, syncope, orthopnea, and PND.  Past Medical History:  Diagnosis Date   Bicipital tenosynovitis    Coronary artery disease 05/2007   [id number]    Fall 02/13/2018   Family history of ischemic heart disease    Gout    Hammer toe    Hip, thigh, leg, and ankle, insect bite, nonvenomous, without mention of infection(916.4)    Hyperlipidemia    Hypertension    Other abnormality of red blood cells    Rotator cuff syndrome of left shoulder    Stroke [facility])     Past Surgical History:  Procedure Laterality Date   CARDIAC CATHETERIZATION     COLONOSCOPY WITH PROPOFOL N/A 01/30/2020   Procedure: COLONOSCOPY WITH PROPOFOL;  Surgeon: Carol Ada, MD;  Location: [facility] ENDOSCOPY;  Service: Endoscopy;  Laterality: N/A;   CORONARY ARTERY BYPASS GRAFT     triple   CORONARY ARTERY BYPASS GRAFT  2008   POLYPECTOMY  01/30/2020   Procedure: POLYPECTOMY;  Surgeon: Carol Ada, MD;  Location: [facility] ENDOSCOPY;  Service: Endoscopy;;   TUBAL LIGATION      Current Medications: No outpatient medications have been marked as taking for the 10/25/21 encounter (Appointment) with Deberah Pelton, NP.     Allergies:   Clonidine derivatives and Spironolactone   Social History   Socioeconomic History   Marital status: Single    Spouse name: Not on file   Number of children: 2   Years of education: Not on file   Highest education level: 11th grade   Occupational History   Occupation: retired  Tobacco Use   Smoking status: Former    Packs/day: 2.00    Years: 35.00    Pack years: 70.00    Types: Cigarettes    Quit date: 08/12/2019    Years since quitting: 2.1   Smokeless tobacco: Former    Quit date: 08/09/2008   Tobacco comments:    Stopped smoking in May 2022 [facility]   Vaping Use   Vaping Use: Never used  Substance and Sexual Activity   Alcohol use: Not Currently    Comment: ocassionally wine   Drug use: Not Currently    Types: Marijuana    Comment: history of cocaine use   Sexual activity: Not on file  Other Topics Concern   Not on file  Social History Narrative   Patient is right-handed. She lives alone in a one level home, 2 steps to enter. Her family is very  active in her care.    Social Determinants of Health   Financial Resource Strain: Not on file  Food Insecurity: Not on file  Transportation Needs: Not on file  Physical Activity: Not on file  Stress: Not on file  Social Connections: Not on file     Family History: The patient's ***family history includes Anxiety disorder in her sister; Diabetes in her father; Heart attack in her father; Hypertension in her mother; Kidney failure in her father; Sarcoidosis in her sister.  ROS:   Please see the history of present illness.    *** All other systems reviewed and are negative.   Risk Assessment/Calculations:   {Does this patient have ATRIAL FIBRILLATION?:[phone]}       Physical Exam:    VS:  There were no vitals taken for this visit.    Wt Readings from Last 3 Encounters:  08/24/21 117 lb 9.6 oz (53.3 kg)  08/10/21 119 lb 3.2 oz (54.1 kg)  02/10/21 118 lb 12.8 oz (53.9 kg)     GEN: *** Well nourished, well developed in no acute distress HEENT: Normal NECK: No JVD; No carotid bruits LYMPHATICS: No lymphadenopathy CARDIAC: ***RRR, no murmurs, rubs, gallops RESPIRATORY:  Clear to auscultation without rales, wheezing or rhonchi  ABDOMEN: Soft,  non-tender, non-distended MUSCULOSKELETAL:  No edema; No deformity  SKIN: Warm and dry NEUROLOGIC:  Alert and oriented x 3 PSYCHIATRIC:  Normal affect    EKGs/Labs/Other Studies Reviewed:    The following studies were reviewed today:  Echocardiogram 08/19/2020   IMPRESSIONS     1. Left ventricular ejection fraction, by estimation, is 65 to 70%. The  left ventricle has normal function. The left ventricle has no regional  wall motion abnormalities. There is mild concentric left ventricular  hypertrophy. Left ventricular diastolic  function could not be evaluated.   2. Right ventricular systolic function is normal. The right ventricular  size is normal. There is normal pulmonary artery systolic pressure. The  estimated right ventricular systolic pressure is 36.1 mmHg.   3. Left atrial size was moderately dilated.   4. Right atrial size was mildly dilated.   5. The mitral valve is normal in structure. Mild mitral valve  regurgitation. No evidence of mitral stenosis.   6. Tricuspid valve regurgitation is mild to moderate.   7. The aortic valve is normal in structure. Aortic valve regurgitation is  not visualized. No aortic stenosis is present.   8. Aortic dilatation noted. There is mild dilatation of the ascending  aorta, measuring 42 mm.   9. The inferior vena cava is normal in size with greater than 50%  respiratory variability, suggesting right atrial pressure of 3 mmHg.  EKG:  EKG is *** ordered today.  The ekg ordered today demonstrates ***  EKG 09/30/2020 atrial fibrillation 133 bpm ST and T wave abnormality consider inferior lateral ischemia  Recent Labs: 01/04/2021: BNP 585.7; Magnesium 2.1 08/10/2021: ALT 19; BUN 15; Creatinine, Ser 0.93; Hemoglobin 11.8; Platelets 317; Potassium 3.7; Sodium 142  Recent Lipid Panel    Component Value Date/Time   CHOL 124 08/10/2021 1208   TRIG 79 08/10/2021 1208   HDL 49 08/10/2021 1208   CHOLHDL 2.5 08/10/2021 1208   CHOLHDL 3.6  02/14/2018 0314   VLDL 16 02/14/2018 0314   LDLCALC 59 08/10/2021 1208    ASSESSMENT & PLAN     Paroxysmal atrial fibrillation-heart rate today***.    CHA2DS2-VASc score 5.  Cardiac unaware.  Continue Increase carvedilol Continue apixaban, aspirin Heart healthy  low-sodium diet-salty 6 given Increase physical activity as tolerated   Essential hypertension-BP today ***118/72.  Well-controlled at home. Continue amlodipine, carvedilol Heart healthy low-sodium diet-salty 6 given Increase physical activity as tolerated   Carotid artery stenosis-occluded bilateral ICA's 03/23/2020.  Medical management recommended Continue atorvastatin, aspirin Heart healthy low-sodium diet-salty 6 given Increase physical activity as tolerated   Hyperlipidemia-LDL *** Continue atorvastatin, aspirin Heart healthy low-sodium high-fiber diet Increase physical activity as tolerated     Disposition: Follow-up with Dr. Percival Spanish in 4-6 months.    {Are you ordering a CV Procedure (e.g. stress test, cath, DCCV, TEE, etc)?   Press F2        :[id number]}    Medication Adjustments/Labs and Tests Ordered: Current medicines are reviewed at length with the patient today.  Concerns regarding medicines are outlined above.  No orders of the defined types were placed in this encounter.  No orders of the defined types were placed in this encounter.   There are no Patient Instructions on file for this visit.   Signed, Deberah Pelton, NP  10/20/2021 9:34 AM      Notice: This dictation was prepared with Dragon dictation along with smaller phrase technology. Any transcriptional errors that result from this process are unintentional and may not be corrected upon review.  I spent***minutes examining this patient, reviewing medications, and using patient centered shared decision making involving her cardiac care.  Prior to her visit I spent greater than 20 minutes reviewing her past medical history,  medications, and  prior cardiac tests.

## 2021-10-25 ENCOUNTER — Ambulatory Visit (HOSPITAL_BASED_OUTPATIENT_CLINIC_OR_DEPARTMENT_OTHER): Payer: Medicaid Other | Admitting: General Practice

## 2021-11-16 ENCOUNTER — Ambulatory Visit (INDEPENDENT_AMBULATORY_CARE_PROVIDER_SITE_OTHER): Payer: Self-pay | Admitting: Podiatry

## 2021-11-16 DIAGNOSIS — Z91199 Patient's noncompliance with other medical treatment and regimen due to unspecified reason: Secondary | ICD-10-CM

## 2021-11-20 NOTE — Progress Notes (Signed)
   Complete physical exam  Patient: Kimberly [name]   DOB: 06/03/1999   66 y.o. Female  MRN: [id number]  Subjective:    No chief complaint on file.   Kimberly [name] is a 66 y.o. female who presents today for a complete physical exam. She reports consuming a {diet types:17450} diet. {types:19826} She generally feels {DESC; WELL/FAIRLY WELL/POORLY:18703}. She reports sleeping {DESC; WELL/FAIRLY WELL/POORLY:18703}. She {does/does not:200015} have additional problems to discuss today.    Most recent fall risk assessment:    02/08/2022   10:42 AM  Fall Risk   Falls in the past year? 0  Number falls in past yr: 0  Injury with Fall? 0  Risk for fall due to : No Fall Risks  Follow up Falls evaluation completed     Most recent depression screenings:    02/08/2022   10:42 AM 12/30/2020   10:46 AM  PHQ 2/9 Scores  PHQ - 2 Score 0 0  PHQ- 9 Score 5     {VISON DENTAL STD PSA (Optional):27386}  {History (Optional):23778}  Patient Care Team: Jessup, Joy, NP as PCP - General (Nurse Practitioner)   Outpatient Medications Prior to Visit  Medication Sig   fluticasone (FLONASE) 50 MCG/ACT nasal spray Place 2 sprays into both nostrils in the morning and at bedtime. After 7 days, reduce to once daily.   norgestimate-ethinyl estradiol (SPRINTEC 28) 0.25-35 MG-MCG tablet Take 1 tablet by mouth daily.   Nystatin POWD Apply liberally to affected area 2 times per day   spironolactone (ALDACTONE) 100 MG tablet Take 1 tablet (100 mg total) by mouth daily.   No facility-administered medications prior to visit.    ROS        Objective:     There were no vitals taken for this visit. {Vitals History (Optional):23777}  Physical Exam   No results found for any visits on 03/16/22. {Show previous labs (optional):23779}    Assessment & Plan:    Routine Health Maintenance and Physical Exam  Immunization History  Administered Date(s) Administered   DTaP 08/17/1999, 10/13/1999,  12/22/1999, 09/06/2000, 03/22/2004   Hepatitis A 01/17/2008, 01/22/2009   Hepatitis B 06/04/1999, 07/12/1999, 12/22/1999   HiB (PRP-OMP) 08/17/1999, 10/13/1999, 12/22/1999, 09/06/2000   IPV 08/17/1999, 10/13/1999, 06/11/2000, 03/22/2004   Influenza,inj,Quad PF,6+ Mos 04/24/2014   Influenza-Unspecified 07/24/2012   MMR 06/11/2001, 03/22/2004   Meningococcal Polysaccharide 01/22/2012   Pneumococcal Conjugate-13 09/06/2000   Pneumococcal-Unspecified 12/22/1999, 03/06/2000   Tdap 01/22/2012   Varicella 06/11/2000, 01/17/2008    Health Maintenance  Topic Date Due   HIV Screening  Never done   Hepatitis C Screening  Never done   INFLUENZA VACCINE  03/14/2022   PAP-Cervical Cytology Screening  03/16/2022 (Originally 06/02/2020)   PAP SMEAR-Modifier  03/16/2022 (Originally 06/02/2020)   TETANUS/TDAP  03/16/2022 (Originally 01/21/2022)   HPV VACCINES  Discontinued   COVID-19 Vaccine  Discontinued    Discussed health benefits of physical activity, and encouraged her to engage in regular exercise appropriate for her age and condition.  Problem List Items Addressed This Visit   None Visit Diagnoses     Annual physical exam    -  Primary   Cervical cancer screening       Need for Tdap vaccination          No follow-ups on file.     Joy Jessup, NP   

## 2021-11-21 NOTE — Progress Notes (Deleted)
? ?NEUROLOGY FOLLOW UP OFFICE NOTE ? ?Nicki Reaper D Wojnar ?[id number] ? ?Assessment/Plan:  ? ?1.  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 ?2.  Paroxysmal atrial fibrillation with RVR ?3.  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  Hypertension ?5.  Hyperlipidemia ?6.  Coronary artery disease ?7.  Tobacco use disorder ?  ? ?1.  Secondary stroke prevention as managed by PCP/cardiology: ?            - Eliquis and ASA '81mg'$  daily  ?            - Lipitor '80mg'$  daily (LDL goal less than 70) ?            - Glycemic control (Hgb A1c goal less than 7) ?            - Blood pressure control.  Given the thrombosis of right PCA and bilateral carotid artery occlusions, blood pressure goal should be 130-160 to ensure adequate perfusion of collaterals. ?            - Tobacco cessation ?            - Mediterranean diet ?2.  Gabapentin '100mg'$ .  May take three times daily for pain if needed. ?3.  Repeat carotid doppler ?4.  Follow up in one year. ? ?Subjective:  ?Kimberly [name] is a 66 year old right-handed black woman with hypertension, hyperlipidemia, paroxysmal atrial fibrillation, coronary artery disease and prior history of strokes who follows up for stroke. ?  ?UPDATE: ?Current medications:  Eliquis; Lipitor '80mg'$ ; ASA '81mg'$ ; gabapentin '100mg'$  daily ?  ?*** ?  ?   ?HISTORY:  ?Kimberly [name] was admitted to [facility] on [facility]18 for stroke presenting as dizziness, fall and left hemianopia.  She was previously on aspirin and Plavix.  She was not given tPA due to outside therapeutic window.  She was found to have new atrial fibrillation.  CT of head revealed right acute to subacute PCA infarct.  MRI of brain confirmed large acute right PCA territory infarct.  CTA of head and neck showed bilateral ICA occlusions at origin, right PCA occlusion and basilar artery irregularity but bilateral MCAs patent from collaterals.  Carotid doppler showed bilateral  ICA occlusion with possible left ICA fresh clot.  Transcranial doppler showed left ophthalmic artery and right ACA flow reversal consistent with bilateral carotid system occlusion.  2D echo showed EF 60-65%.  LDL was 100.  Hgb A1c was 5.8.  She remained on ASA and Plavix.  Due to large size of stroke, initiation of anticoagulation was postponed until 5 to 7 days post stroke to avoid hemorrhagic conversion.  She was subsequently started on Eliquis.  Plavix subsequently discontinued.  She was started on Lipitor '80mg'$ .   ?  ?She has residual left sided weakness and pain.  She uses a walker.  She uses a stress ball to strengthen her hand.  Medicaid only approves 3 sessions a year of physical therapy. ?She smokes cigarettes (about 1 pack per week) ? ?PAST MEDICAL HISTORY: ?Past Medical History:  ?Diagnosis Date  ? Bicipital tenosynovitis   ? Coronary artery disease 05/2007  ? cabgx4   ? Fall 02/13/2018  ? Family history of ischemic heart disease   ? Gout   ? Hammer toe   ? Hip, thigh, leg, and ankle, insect bite, nonvenomous, without mention of infection(916.4)   ? Hyperlipidemia   ? Hypertension   ? Other abnormality of red blood  cells   ? Rotator cuff syndrome of left shoulder   ? [facility]   ? ? ?MEDICATIONS: ?Current Outpatient Medications on File Prior to Visit  ?Medication Sig Dispense Refill  ? acetaminophen (TYLENOL) 325 MG tablet Take 2 tablets (650 mg total) by mouth every 6 (six) hours as needed for moderate pain, mild pain, fever or headache. 20 tablet 0  ? alendronate (FOSAMAX) 70 MG tablet Take 1 tablet (70 mg total) by mouth every 7 (seven) days. Take with a full glass of water on an empty stomach. 12 tablet 1  ? amLODipine (NORVASC) 5 MG tablet Take 1 tablet (5 mg total) by mouth daily. 90 tablet 3  ? apixaban (ELIQUIS) 5 MG TABS tablet Take 1 tablet (5 mg total) by mouth 2 (two) times daily. 180 tablet 3  ? atorvastatin (LIPITOR) 80 MG tablet TAKE 1 TABLET BY MOUTH EVERY DAY 90 tablet 2  ? carvedilol  (COREG) 6.25 MG tablet TAKE 1 TABLET BY MOUTH 2 TIMES DAILY WITH A meal 60 tablet 2  ? docusate sodium (COLACE) 100 MG capsule Take 1 capsule (100 mg total) by mouth 2 (two) times daily as needed for mild constipation. 20 capsule 0  ? doxycycline (VIBRA-TABS) 100 MG tablet TAKE 1 TABLET BY MOUTH EVERY TWELVE HOURS (Patient not taking: Reported on 08/24/2021) 5 tablet 0  ? FEROSUL 325 (65 Fe) MG tablet TAKE 1 TABLET BY MOUTH EVERY DAY WITH BREAKFAST 30 tablet 8  ? furosemide (LASIX) 20 MG tablet Take 20 mg by mouth daily x 3 days (Patient not taking: Reported on 08/24/2021) 30 tablet 0  ? hydrocortisone cream 1 % Apply 1 application topically daily as needed for itching.    ? mirtazapine (REMERON) 15 MG tablet TAKE 1 TABLET BY MOUTH AT BEDTIME 30 tablet 2  ? Multiple Vitamin (MULTIVITAMIN) capsule Take 1 capsule by mouth daily.    ? nicotine (NICODERM CQ - DOSED IN MG/24 HOURS) 21 mg/24hr patch Place 1 patch (21 mg total) onto the skin daily. 30 patch 3  ? pantoprazole (PROTONIX) 40 MG tablet TAKE 1 TABLET BY MOUTH EVERY DAY 30 tablet 2  ? Tiotropium Bromide-Olodaterol (STIOLTO RESPIMAT) 2.5-2.5 MCG/ACT AERS Inhale 2 puffs into the lungs daily. 4 g 0  ? Vitamin D, Ergocalciferol, (DRISDOL) 1.25 MG (50000 UNIT) CAPS capsule Take 1 capsule (50,000 Units total) by mouth 2 (two) times a week. 24 capsule 1  ? ?No current facility-administered medications on file prior to visit.  ? ? ?ALLERGIES: ?Allergies  ?Allergen Reactions  ? Clonidine Derivatives Other (See Comments)  ?  Patch causes scars  ? Spironolactone Rash  ? ? ?FAMILY HISTORY: ?Family History  ?Problem Relation Age of Onset  ? Hypertension Mother   ? Heart attack Father   ? Diabetes Father   ? Kidney failure Father   ? Anxiety disorder Sister   ? Sarcoidosis Sister   ? ? ?  ?Objective:  ?*** ?General: No acute distress.  Patient appears well-groomed.   ?Head:  Normocephalic/atraumatic ?Eyes:  Fundi examined but not visualized ?Neck: supple, no paraspinal  tenderness, full range of motion ?Heart:  Regular rate and rhythm ?Lungs:  Clear to auscultation bilaterally ?Back: No paraspinal tenderness ?Neurological Exam:alert and oriented to person, place, and time. Speech fluent and not dysarthric, language intact.  Left homonymous hemianopsia.  Otherwise, CN II-XII intact. Bulk and tone normal, muscle strength:  4+/5 left upper extremity except 3+/5 left hand grip; 4+/5 left lower extremity,, 5/5 right upper, 5-/5 right  lower extremities.  Decreased pinprick andvibratory sensation on left, intact on right. Deep tendon reflexes 3+ on left, 2+ on right, toes downgoing.  Finger to nose testing intact.  Uses upper strength to stand.  Significant difficulty maintaining balance to stand.  Hemiplegic gait.  Unsteady.  Requires assistance to ambulate. ? ? ?Metta Clines, DO ? ?CC: [name], FNP ? ? ? ? ? ? ?

## 2021-11-22 ENCOUNTER — Ambulatory Visit: Payer: Medicare Other | Admitting: Neurology

## 2021-11-22 ENCOUNTER — Telehealth: Payer: Self-pay

## 2021-11-22 ENCOUNTER — Encounter: Payer: Self-pay | Admitting: Neurology

## 2021-11-22 DIAGNOSIS — Z029 Encounter for administrative examinations, unspecified: Secondary | ICD-10-CM

## 2021-11-22 NOTE — Chronic Care Management (AMB) (Signed)
? ? ?Chronic Care Management ?Pharmacy Assistant  ? ?Name: Kimberly [name]  MRN: [id number] DOB: [birth year] ? ?Reason for Encounter: Disease State/ Hypertension ? ?Recent office visits:  ?None ? ?Recent consult visits:  ?None ? ?Hospital visits:  ?None in previous 6 months ? ?Medications: ?Outpatient Encounter Medications as of 11/22/2021  ?Medication Sig  ? acetaminophen (TYLENOL) 325 MG tablet Take 2 tablets (650 mg total) by mouth every 6 (six) hours as needed for moderate pain, mild pain, fever or headache.  ? alendronate (FOSAMAX) 70 MG tablet Take 1 tablet (70 mg total) by mouth every 7 (seven) days. Take with a full glass of water on an empty stomach.  ? amLODipine (NORVASC) 5 MG tablet Take 1 tablet (5 mg total) by mouth daily.  ? apixaban (ELIQUIS) 5 MG TABS tablet Take 1 tablet (5 mg total) by mouth 2 (two) times daily.  ? atorvastatin (LIPITOR) 80 MG tablet TAKE 1 TABLET BY MOUTH EVERY DAY  ? carvedilol (COREG) 6.25 MG tablet TAKE 1 TABLET BY MOUTH 2 TIMES DAILY WITH A meal  ? docusate sodium (COLACE) 100 MG capsule Take 1 capsule (100 mg total) by mouth 2 (two) times daily as needed for mild constipation.  ? doxycycline (VIBRA-TABS) 100 MG tablet TAKE 1 TABLET BY MOUTH EVERY TWELVE HOURS (Patient not taking: Reported on 08/24/2021)  ? FEROSUL 325 (65 Fe) MG tablet TAKE 1 TABLET BY MOUTH EVERY DAY WITH BREAKFAST  ? furosemide (LASIX) 20 MG tablet Take 20 mg by mouth daily x 3 days (Patient not taking: Reported on 08/24/2021)  ? hydrocortisone cream 1 % Apply 1 application topically daily as needed for itching.  ? mirtazapine (REMERON) 15 MG tablet TAKE 1 TABLET BY MOUTH AT BEDTIME  ? Multiple Vitamin (MULTIVITAMIN) capsule Take 1 capsule by mouth daily.  ? nicotine (NICODERM CQ - DOSED IN MG/24 HOURS) 21 mg/24hr patch Place 1 patch (21 mg total) onto the skin daily.  ? pantoprazole (PROTONIX) 40 MG tablet TAKE 1 TABLET BY MOUTH EVERY DAY  ? Tiotropium Bromide-Olodaterol (STIOLTO RESPIMAT) 2.5-2.5 MCG/ACT  AERS Inhale 2 puffs into the lungs daily.  ? Vitamin D, Ergocalciferol, (DRISDOL) 1.25 MG (50000 UNIT) CAPS capsule Take 1 capsule (50,000 Units total) by mouth 2 (two) times a week.  ? ?No facility-administered encounter medications on file as of 11/22/2021.  ?Reviewed chart prior to disease state call. Spoke with patient regarding BP ? ?Recent Office Vitals: ?BP Readings from Last 3 Encounters:  ?08/24/21 (!) 70/60  ?08/10/21 122/80  ?05/04/21 105/66  ? ?Pulse Readings from Last 3 Encounters:  ?08/24/21 (!) 114  ?08/10/21 96  ?05/04/21 81  ?  ?Wt Readings from Last 3 Encounters:  ?08/24/21 117 lb 9.6 oz (53.3 kg)  ?08/10/21 119 lb 3.2 oz (54.1 kg)  ?02/10/21 118 lb 12.8 oz (53.9 kg)  ?  ? ?Kidney Function ?Lab Results  ?Component Value Date/Time  ? CREATININE 0.93 08/10/2021 12:08 PM  ? CREATININE 0.87 01/04/2021 04:38 PM  ? CREATININE 0.98 05/04/2014 10:47 AM  ? GFRNONAA 57 (L) 12/21/2020 02:51 AM  ? GFRAA >60 04/27/2020 02:27 PM  ? ? ? ?  Latest Ref Rng & Units 08/10/2021  ? 12:08 PM 01/04/2021  ?  4:38 PM 12/21/2020  ?  2:51 AM  ?BMP  ?Glucose 70 - 99 mg/dL 66   99   141    ?BUN 8 - 27 mg/dL '15   23   17    '$ ?Creatinine 0.57 - 1.00 mg/dL 0.93  0.87   1.09    ?BUN/Creat Ratio 12 - '28 16   26     '$ ?Sodium 134 - 144 mmol/L 142   140   135    ?Potassium 3.5 - 5.2 mmol/L 3.7   4.4   3.8    ?Chloride 96 - 106 mmol/L 105   103   108    ?CO2 20 - 29 mmol/L '22   18   21    '$ ?Calcium 8.7 - 10.3 mg/dL 9.6   9.4   8.5    ? ? ?11-22-2021: 1st attempt left VM ?11-24-2021: 2nd attempt left VM ?11-28-2021: 3rd attempt left VM ? ?Care Gaps: ?Tdap overude ?Shingrix overdue ?Dexa scan overude ? ?Star Rating Drugs: ?Atorvastatin 80 mg- Last filled 11-18-2021 30 [facility] ? ?Malecca Hicks CMA ?Clinical Pharmacist Assistant ?[phone] ? ?

## 2021-11-23 ENCOUNTER — Encounter: Payer: Self-pay | Admitting: Neurology

## 2021-11-23 ENCOUNTER — Telehealth: Payer: Medicaid Other

## 2021-11-25 ENCOUNTER — Encounter: Payer: Self-pay | Admitting: Podiatry

## 2021-11-25 ENCOUNTER — Ambulatory Visit (INDEPENDENT_AMBULATORY_CARE_PROVIDER_SITE_OTHER): Payer: Medicare Other | Admitting: Podiatry

## 2021-11-25 DIAGNOSIS — M79675 Pain in left toe(s): Secondary | ICD-10-CM | POA: Diagnosis not present

## 2021-11-25 DIAGNOSIS — M79674 Pain in right toe(s): Secondary | ICD-10-CM

## 2021-11-25 DIAGNOSIS — B351 Tinea unguium: Secondary | ICD-10-CM

## 2021-12-01 ENCOUNTER — Ambulatory Visit: Payer: Medicare Other | Admitting: Nurse Practitioner

## 2021-12-01 ENCOUNTER — Telehealth: Payer: Self-pay | Admitting: *Deleted

## 2021-12-01 NOTE — Chronic Care Management (AMB) (Signed)
?  Care Management  ? ?Note ? ?12/01/2021 ?Name: [name] MRN: [id number] DOB: 08-21-1955 ? ?Kimberly [name] is a 66 y.o. year old female who is a primary care patient of [name], FNP and is actively engaged with the care management team. I reached out to [place] by phone today to assist with re-scheduling a follow up visit with the RN Case Manager ? ?Follow up plan: ?Unsuccessful telephone outreach attempt made. A HIPAA compliant phone message was left for the patient providing contact information and requesting a return call.  ?The care management team will reach out to the patient again over the next 7 days.  ?If patient returns call to provider office, please advise to call [place] at [phone]. ? ?Laverda Sorenson  ?Care Guide, Embedded Care Coordination ?[place]  Care Management  ?Direct Dial: [phone] ? ?

## 2021-12-04 NOTE — Progress Notes (Signed)
?  Subjective:  ?Patient ID: Kimberly [name], female    DOB: 04/26/1956,  MRN: [id number] ? ?Kimberly [name] presents to clinic today for painful elongated mycotic toenails 1-5 bilaterally which are tender when wearing enclosed shoe gear. Pain is relieved with periodic professional debridement. ? ?New problem(s): None.  ? ?PCP is [name], FNP , and last visit was August 10, 2021. ? ?Allergies  ?Allergen Reactions  ? Clonidine Derivatives Other (See Comments)  ?  Patch causes scars  ? Spironolactone Rash  ? ? ?Review of Systems: Negative except as noted in the HPI. ? ?Objective:  ? ?Vascular Examination: ?Vascular status intact b/l with palpable pedal pulses. Pedal hair present b/l. CFT immediate b/l. No edema. No pain with calf compression b/l. Skin temperature gradient WNL b/l.  ? ?Neurological Examination: ?Sensation grossly intact b/l with 10 gram monofilament. Vibratory sensation intact b/l.  ? ?Dermatological Examination: ?Pedal skin with normal turgor, texture and tone b/l. Toenails 1-5 b/l thick, discolored, elongated with subungual debris and pain on dorsal palpation. No hyperkeratotic lesions noted b/l.  ? ?Musculoskeletal Examination: ?Muscle strength 5/5 to b/l LE. Hammertoe deformity noted 2-5 b/l. Pes planus deformity noted bilateral LE. ? ?Radiographs: None ? ?Last A1c:   ? ?  Latest Ref Rng & Units 08/10/2021  ? 12:08 PM  ?Hemoglobin A1C  ?Hemoglobin-A1c 4.8 - 5.6 % 6.0    ?  ?Assessment/Plan: ?1. Pain due to onychomycosis of toenails of both feet   ?-Patient was evaluated and treated. All patient's and/or POA's questions/concerns answered on today's visit. ?-Patient to continue soft, supportive shoe gear daily. ?-Mycotic toenails 1-5 bilaterally were debrided in length and girth with sterile nail nippers and dremel without incident. ?-Patient/POA to call should there be question/concern in the interim.  ? ?Return in about 3 months (around 02/24/2022). ? ?Marzetta Board, DPM  ?

## 2021-12-06 ENCOUNTER — Other Ambulatory Visit: Payer: Self-pay | Admitting: Nurse Practitioner

## 2021-12-06 DIAGNOSIS — R630 Anorexia: Secondary | ICD-10-CM

## 2021-12-08 NOTE — Chronic Care Management (AMB) (Signed)
?  Care Management  ? ?Note ? ?12/08/2021 ?Name: Kimberly [name] MRN: [id number] DOB: September 17, 1955 ? ?Kimberly [name] is a 66 y.o. year old female who is a primary care patient of [name], FNP and is actively engaged with the care management team. I reached out to [place] by phone today to assist with re-scheduling a follow up visit with the RN Case Manager ? ?Follow up plan: ?Telephone appointment with care management team member scheduled for:01/03/22 ? ?Laverda Sorenson  ?Care Guide, Embedded Care Coordination ?[place]  Care Management  ?Direct Dial: [phone] ? ?

## 2021-12-14 ENCOUNTER — Ambulatory Visit: Payer: Medicaid Other | Admitting: Nurse Practitioner

## 2021-12-16 ENCOUNTER — Telehealth: Payer: Commercial Managed Care - HMO

## 2021-12-23 ENCOUNTER — Ambulatory Visit: Payer: Medicare Other | Admitting: Neurology

## 2021-12-26 ENCOUNTER — Ambulatory Visit
Admission: RE | Admit: 2021-12-26 | Discharge: 2021-12-26 | Disposition: A | Discharge status: Home / Self Care | Payer: Medicaid Other | Source: Ambulatory Visit | Attending: Nurse Practitioner | Admitting: Nurse Practitioner

## 2021-12-26 DIAGNOSIS — R911 Solitary pulmonary nodule: Secondary | ICD-10-CM

## 2021-12-26 DIAGNOSIS — R0602 Shortness of breath: Secondary | ICD-10-CM | POA: Diagnosis not present

## 2022-01-03 ENCOUNTER — Telehealth: Payer: Commercial Managed Care - HMO

## 2022-01-03 ENCOUNTER — Telehealth: Payer: Self-pay

## 2022-01-03 NOTE — Telephone Encounter (Signed)
  Care Management   Follow Up Note   01/03/2022 Name: Kimberly [name] MRN: [id number] DOB: Oct 18, 1955   Referred by: [name], FNP Reason for referral : Chronic Care Management (RN CM Follow up call )   An unsuccessful telephone outreach was attempted today. The patient was referred to the case management team for assistance with care management and care coordination.   Follow Up Plan: A HIPPA compliant phone message was left for the patient providing contact information and requesting a return call.   Barb Merino, RN, BSN, CCM Care Management Coordinator [place] [facility]  Direct Phone: [phone]

## 2022-01-13 ENCOUNTER — Telehealth: Payer: Self-pay

## 2022-01-13 NOTE — Progress Notes (Signed)
    Called Heloise Beecham Fritchman, No answer, left message of appointment on 01-17-2022 at 3:00 via telephone visit with [name], Pharm D. Notified to have all medications, supplements, blood pressure and/or blood sugar logs available during appointment and to return call if need to reschedule.   [place] Pharmacist Assistant [phone]

## 2022-01-17 ENCOUNTER — Telehealth: Payer: Medicaid Other

## 2022-01-17 ENCOUNTER — Telehealth: Payer: Self-pay

## 2022-01-17 NOTE — Telephone Encounter (Signed)
  Care Management   Follow Up Note   01/17/2022 Name: Kimberly [name] MRN: [id number] DOB: February 25, 1956   Referred by: [name], FNP Reason for referral : No chief complaint on file.  Successful contact was made with patient to discuss care management and care coordination services. Patients sister has to go into work today at 4 pm so she meant to reschedule this appointment to another day preferably in the morning. I  scheduled her appointment to 02/07/2022 in the morning. [name] agreed with that time.    Follow Up Plan: The patient has been provided with contact information for the care management team and has been advised to call with any health related questions or concerns.   [name], CPP, PharmD Clinical Pharmacist Practitioner [facility] [phone]

## 2022-01-18 ENCOUNTER — Other Ambulatory Visit: Payer: Self-pay | Admitting: Nurse Practitioner

## 2022-02-03 ENCOUNTER — Telehealth: Payer: Self-pay

## 2022-02-03 NOTE — Progress Notes (Signed)
    Called [name], No answer, left message of appointment on 02-07-2022 at 9:30 via telephone visit with [name], Pharm D. Notified to have all medications, supplements, blood pressure and/or blood sugar logs available during appointment and to return call if need to reschedule.    [place] Pharmacist Assistant [phone]

## 2022-02-07 ENCOUNTER — Other Ambulatory Visit: Payer: Self-pay | Admitting: Nurse Practitioner

## 2022-02-07 ENCOUNTER — Ambulatory Visit (INDEPENDENT_AMBULATORY_CARE_PROVIDER_SITE_OTHER): Payer: Medicare Other

## 2022-02-07 ENCOUNTER — Encounter: Payer: Self-pay | Admitting: Nurse Practitioner

## 2022-02-07 ENCOUNTER — Telehealth: Payer: Self-pay

## 2022-02-07 DIAGNOSIS — E509 Vitamin A deficiency, unspecified: Secondary | ICD-10-CM

## 2022-02-07 DIAGNOSIS — I10 Essential (primary) hypertension: Secondary | ICD-10-CM

## 2022-02-07 DIAGNOSIS — M810 Age-related osteoporosis without current pathological fracture: Secondary | ICD-10-CM

## 2022-02-07 HISTORY — DX: Age-related osteoporosis without current pathological fracture: M81.0

## 2022-02-07 MED ORDER — VITAMIN D (ERGOCALCIFEROL) 1.25 MG (50000 UNIT) PO CAPS: 50000.0000 [IU] | ORAL_CAPSULE | ORAL | 1 refills | Status: DC

## 2022-02-07 NOTE — Progress Notes (Signed)
Chronic Care Management Pharmacy Note  02/07/2022 Name:  Kimberly [name] MRN:  [id number] DOB:  10-17-1955  Summary: Recommend patient be started on Osteoporosis medication. I also suggest that she is seen   Recommendations/Changes made from today's visit: Recommend patient be started on Alendronate 70 mg tablet once per week - if tolerable  Recommend Osteoporosis be added to patients diagnosis  Recommend patient have Vitamin D lab completed since supplementation was started in 09/2021 Recommend patient have a follow up visit scheduled with PCP team  Recommend patients DEXA scan be added labs that have been completed and removed from Care Gaps   Plan: Collaborate with [facility], confirm patients medication is sent to the pharmacy  Follow up with PCP team to determine if patient should be switched to Reclast due to GERD Collaborate with the PCP team to schedule patients for a follow up visit     Subjective: Kimberly [name] is an 66 y.o. year old female who is a primary patient of [name], [place].  The CCM team was consulted for assistance with disease management and care coordination needs.  The patients sister and daughter   Engaged with patient by telephone for follow up visit in response to provider referral for pharmacy case management and/or care coordination services.   Consent to Services:  The patient was given information about Chronic Care Management services, agreed to services, and gave verbal consent prior to initiation of services.  Please see initial visit note for detailed documentation.   Patient Care Team: [name], FNP as PCP - General (General Practice) [name], MD as PCP - Cardiology (Cardiology) Verl Blalock, Marijo Conception, MD (Inactive) (Cardiology) Lynne Logan, RN as Case Manager Pieter Partridge, DO as Consulting Physician (Neurology) Mayford Knife, [facility] (Pharmacist)  Recent office visits: 09/2021 Last OV  Recent consult  visits: 11/25/2021 Podiatry OV 09/22/2021 DEXA scan/Mammogram 08/24/2021 Cardiology [facility] visits: None in previous 6 months   Objective:  Lab Results  Component Value Date   CREATININE 0.93 08/10/2021   BUN 15 08/10/2021   EGFR 68 08/10/2021   GFRNONAA 57 (L) 12/21/2020   GFRAA >60 04/27/2020   NA 142 08/10/2021   K 3.7 08/10/2021   CALCIUM 9.6 08/10/2021   CO2 22 08/10/2021   GLUCOSE 66 (L) 08/10/2021    Lab Results  Component Value Date/Time   HGBA1C 6.0 (H) 08/10/2021 12:08 PM   HGBA1C 5.5 09/02/2019 04:51 PM    Last diabetic Eye exam: No results found for: "HMDIABEYEEXA"  Last diabetic Foot exam: No results found for: "HMDIABFOOTEX"   Lab Results  Component Value Date   CHOL 124 08/10/2021   HDL 49 08/10/2021   LDLCALC 59 08/10/2021   TRIG 79 08/10/2021   CHOLHDL 2.5 08/10/2021       Latest Ref Rng & Units 08/10/2021   12:08 PM 01/04/2021    4:38 PM 12/21/2020    2:51 AM  Hepatic Function  Total Protein 6.0 - 8.5 g/dL 6.9  7.0  5.3   Albumin 3.8 - 4.8 g/dL 4.7  4.5  2.6   AST 0 - 40 IU/L 18  29  17    ALT 0 - 32 IU/L 19  63  33   Alk Phosphatase 44 - 121 IU/L 123  131  90   Total Bilirubin 0.0 - 1.2 mg/dL 0.6  0.4  0.6     Lab Results  Component Value Date/Time   TSH 0.897 08/19/2020 09:11 AM  TSH 1.550 12/02/2019 02:23 PM   TSH 1.245 02/13/2018 11:25 AM   TSH 1.466 11/24/2008 09:25 PM       Latest Ref Rng & Units 08/10/2021   12:08 PM 01/04/2021    4:38 PM 12/21/2020    2:51 AM  CBC  WBC 3.4 - 10.8 x10E3/uL 5.3  7.8  11.4   Hemoglobin 11.1 - 15.9 g/dL 11.8  12.6  10.0   Hematocrit 34.0 - 46.6 % 39.7  40.9  31.7   Platelets 150 - 450 x10E3/uL 317  307  187     Lab Results  Component Value Date/Time   VD25OH 15.6 (L) 08/10/2021 12:08 PM    Clinical ASCVD: Yes  The ASCVD Risk score (Arnett DK, et al., 2019) failed to calculate for the following reasons:   The patient has a prior MI or stroke diagnosis       08/10/2021   10:39  AM 03/30/2020   10:59 AM 12/02/2019   12:17 PM  Depression screen PHQ 2/9  Decreased Interest 1 1 0  Down, Depressed, Hopeless 1 1 1   PHQ - 2 Score 2 2 1   Altered sleeping 3 3 0  Tired, decreased energy 3 3 1   Change in appetite 1 3 1   Feeling bad or failure about yourself  0 1 1  Trouble concentrating 1 1 1   Moving slowly or fidgety/restless 1 3 1   Suicidal thoughts 0 0 0  PHQ-9 Score 11 16 6   Difficult doing work/chores Somewhat difficult Somewhat difficult Somewhat difficult      Social History   Tobacco Use  Smoking Status Former   Packs/day: 2.00   Years: 35.00   Total pack years: 70.00   Types: Cigarettes   Quit date: 08/12/2019   Years since quitting: 2.4  Smokeless Tobacco Former   Quit date: 08/09/2008  Tobacco Comments   Stopped smoking in May 2022 ARJ    BP Readings from Last 3 Encounters:  08/24/21 (!) 70/60  08/10/21 122/80  05/04/21 105/66   Pulse Readings from Last 3 Encounters:  08/24/21 (!) 114  08/10/21 96  05/04/21 81   Wt Readings from Last 3 Encounters:  08/24/21 117 lb 9.6 oz (53.3 kg)  08/10/21 119 lb 3.2 oz (54.1 kg)  02/10/21 118 lb 12.8 oz (53.9 kg)   BMI Readings from Last 3 Encounters:  08/24/21 18.42 kg/m  08/10/21 18.67 kg/m  02/10/21 18.61 kg/m    Assessment/Interventions: Review of patient past medical history, allergies, medications, health status, including review of consultants reports, laboratory and other test data, was performed as part of comprehensive evaluation and provision of chronic care management services.   SDOH:  (Social Determinants of Health) assessments and interventions performed: No  SDOH Screenings   Alcohol Screen: Not on file  Depression (PHQ2-9): Medium Risk (08/10/2021)   Depression (PHQ2-9)    PHQ-2 Score: 11  Financial Resource Strain: High Risk (12/06/2018)   Overall Financial Resource Strain (CARDIA)    Difficulty of Paying Living Expenses: Hard  Food Insecurity: Food Insecurity Present  (12/06/2018)   Hunger Vital Sign    Worried About Running Out of Food in the Last Year: Sometimes true    Ran Out of Food in the Last Year: Often true  Housing: Medium Risk (12/06/2018)   Housing    Last Housing Risk Score: 1  Physical Activity: Inactive (12/06/2018)   Exercise Vital Sign    Days of Exercise per Week: 0 days    Minutes of Exercise per  Session: 0 min  Social Connections: Unknown (12/06/2018)   Social Connection and Isolation Panel [NHANES]    Frequency of Communication with Friends and Family: Not asked    Frequency of Social Gatherings with Friends and Family: Not asked    Attends Religious Services: Not on file    Active Member of Clubs or Organizations: Not on file    Attends Archivist Meetings: Not on file    Marital Status: Not on file  Stress: No Stress Concern Present (12/06/2018)   [employer] [place]    Feeling of Stress : Only a little  Tobacco Use: Medium Risk (11/25/2021)   Patient History    Smoking Tobacco Use: Former    Smokeless Tobacco Use: Former    Passive Exposure: Not on Pensions consultant Needs: Unmet Transportation Needs (12/06/2018)   PRAPARE - Hydrologist (Medical): Yes    Lack of Transportation (Non-Medical): Yes    CCM Care Plan  Allergies  Allergen Reactions   Clonidine Derivatives Other (See Comments)    Patch causes scars   Spironolactone Rash    Medications Reviewed Today     Reviewed by Mayford Knife, [facility] (Pharmacist) on 02/07/22 at [id number]  Med List Status: <None>   Medication Order Taking? Sig Documenting Provider Last Dose Status Informant  acetaminophen (TYLENOL) 325 MG tablet [id number]  Take 2 tablets (650 mg total) by mouth every 6 (six) hours as needed for moderate pain, mild pain, fever or headache. Thurnell Lose, MD  Active   alendronate (FOSAMAX) 70 MG tablet [id number]  Take 1 tablet (70 mg total) by mouth every 7  (seven) days. Take with a full glass of water on an empty stomach. [name], FNP  Active   amLODipine (NORVASC) 5 MG tablet [id number]  Take 1 tablet (5 mg total) by mouth daily. [name], MD  Active   atorvastatin (LIPITOR) 80 MG tablet [id number]  TAKE 1 TABLET BY MOUTH EVERY DAY [name], FNP  Active   carvedilol (COREG) 6.25 MG tablet [id number]  TAKE 1 TABLET BY MOUTH 2 TIMES DAILY WITH A meal [name], FNP  Active   docusate sodium (COLACE) 100 MG capsule [id number]  Take 1 capsule (100 mg total) by mouth 2 (two) times daily as needed for mild constipation. Thurnell Lose, MD  Active   doxycycline (VIBRA-TABS) 100 MG tablet [id number]  TAKE 1 TABLET BY MOUTH EVERY TWELVE HOURS  Patient not taking: Reported on 08/24/2021   [name], FNP  Active   ELIQUIS 5 MG TABS tablet [id number]  TAKE 1 TABLET BY MOUTH 2 TIMES DAILY [name], FNP  Active   FEROSUL 325 (65 Fe) MG tablet [id number]  TAKE 1 TABLET BY MOUTH EVERY DAY WITH Nettie Elm, MD  Active   furosemide (LASIX) 20 MG tablet [id number]  Take 20 mg by mouth daily x 3 days  Patient not taking: Reported on 08/24/2021   [name], FNP  Active   hydrocortisone cream 1 % [id number]  Apply 1 application topically daily as needed for itching. [provider]  Active   mirtazapine (REMERON) 15 MG tablet [id number]  TAKE 1 TABLET BY MOUTH AT BEDTIME [name], FNP  Active   Multiple Vitamin (MULTIVITAMIN) capsule [id number]  Take 1 capsule by mouth daily. [provider]  Active Family Member  nicotine (NICODERM CQ - DOSED IN MG/24 HOURS) 21 mg/24hr patch [id number]  Place 1  patch (21 mg total) onto the skin daily. [name], FNP  Active   pantoprazole (PROTONIX) 40 MG tablet [id number]  TAKE 1 TABLET BY MOUTH EVERY DAY [name], FNP  Active   Tiotropium Bromide-Olodaterol (STIOLTO RESPIMAT) 2.5-2.5 MCG/ACT AERS [id number]  Inhale 2 puffs into the lungs daily. Collene Gobble, MD   Active   Vitamin D, Ergocalciferol, (DRISDOL) 1.25 MG (50000 UNIT) CAPS capsule [id number]  TAKE 1 CAPSULE BY MOUTH every 7 DAYS [name], FNP  Active   Med List Note Elyn Peers, CPhT 12/18/20 2051): Obtained med hx from sister             Patient Active Problem List   Diagnosis Date Noted   Pulmonary nodule 01/25/2021   Shortness of breath 01/25/2021   Sepsis ([place]) 12/18/2020   Family history of malignant neoplasm of gastrointestinal tract 12/07/2020   Personal history of colonic polyps 12/07/2020   Secondary hypercoagulable state ([place]) 10/14/2020   Right sided abdominal pain    Abdominal wall pain    Atrial fibrillation ([place]) 08/19/2020   Educated about COVID-19 virus infection 11/16/2019   Urinary frequency 04/14/2019   Cough 04/14/2019   AKI (acute kidney injury) ([place]) 11/26/2018   Diarrhea 11/25/2018   Dehydration, mild 11/25/2018   Hemiparesis affecting nondominant 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 ([place]) 07/31/2018   Muscle ache 07/31/2018   Prediabetes 07/31/2018   Chronic gout without tophus 07/31/2018   Vision loss 07/31/2018   Malnutrition of moderate degree 02/15/2018   Carotid occlusion, bilateral    Atrial fibrillation with RVR ([place]) 02/13/2018   Positive D dimer 02/13/2018   Back pain 02/13/2018   CVA (cerebral vascular accident) ([place]) 02/13/2018   Paroxysmal atrial fibrillation ([place]) 02/13/2018   Coronary artery disease involving native coronary artery of native heart without angina pectoris 02/13/2018   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[place]) 02/13/2018   Aortic atherosclerosis ([place]) 02/13/2018   CAD, ARTERY BYPASS GRAFT 05/03/2009   TOBACCO USE, QUIT 05/03/2009   ROTATOR CUFF SYNDROME, LEFT 02/12/2009   Shoulder pain, left 11/03/2008   HAMMER TOE, ACQUIRED 02/11/2008   INSECT BITE, LEG 02/11/2008   Cerebral artery occlusion with cerebral infarction ([place]) 12/05/2007    Dyslipidemia 11/05/2007   UNSPECIFIED DISORDER TEETH&SUPPORTING STRUCTURES 11/05/2007   BICEPS TENDINITIS, RIGHT 04/24/2007   Essential hypertension 04/23/2007   MICROCYTOSIS 04/23/2007    Immunization History  Administered Date(s) Administered   Influenza Split 06/24/2016   Influenza,inj,Quad PF,6+ Mos 07/29/2013, 06/17/2017, 05/17/2020   Influenza-Unspecified 07/18/2021   Moderna SARS-COV2 Booster Vaccination 09/30/2020   Moderna Sars-Covid-2 Vaccination 01/23/2020, 02/20/2020   Zoster Recombinat (Shingrix) 03/16/2021    Conditions to be addressed/monitored:  Coronary Artery Disease and Osteoporosis  Care Plan : [place]  Updates made by Mayford Knife, RPH since 02/07/2022 12:00 AM     Problem: HTN, HLD      Goal: Disease Management   Recent Progress: On track  Note:    Current Barriers:  Unable to independently monitor therapeutic efficacy Unable to maintain control of Osteoporosis  Pharmacist Clinical Goal(s):  Patient will achieve adherence to monitoring guidelines and medication adherence to achieve therapeutic efficacy through collaboration with PharmD and provider.   Interventions: 1:1 collaboration with [name], FNP regarding development and update of comprehensive plan of care as evidenced by provider attestation and co-signature Inter-disciplinary care team collaboration (see longitudinal plan of care) Comprehensive medication review performed; medication list updated in electronic  medical record  Hypertension (BP goal <140/90) -Controlled -Current treatment: Amlodipine 5 mg tablet once per day Appropriate, Effective, Safe, Accessible Carvedilol 6.25 mg tablet twice per day with a meal Appropriate, Effective, Safe, Accessible -Current home readings: will review further during next office visit  -Current dietary habits: will review further during next office visit  -Current exercise habits: will review further during next office visit   -Denies hypotensive/hypertensive symptoms -Educated on Daily salt intake goal < 2300 mg; Importance of home blood pressure monitoring; Proper BP monitoring technique; -Counseled to monitor BP at home at least three times per week, document, and provide log at future appointments -Recommended to continue current medication   Osteoporosis (Goal Reduce Bone Loss) -Uncontrolled -Last DEXA Scan: 09/22/2021  T-Score femoral neck: -3.5 -Patient is a candidate for pharmacologic treatment due to T-Score < -2.5 in femoral neck -Current treatment  Alendronate 70 mg tablet once per week - once a week on Tuesday Appropriate, Effective -Medications previously tried: None noted at this time   -Counseled on oral bisphosphonate administration: take in the morning, 30 minutes prior to food with 6-8 oz of water. Do not lie down for at least 30 minutes after taking. Recommend weight-bearing and muscle strengthening exercises for building and maintaining bone density. -Counseled on the importance of contacting PCP team with any questions  Collaborated with PCP to resend patient prescription to [facility]    Patient Goals/Self-Care Activities Patient will:  - take medications as prescribed as evidenced by patient report and record review  Follow Up Plan: The patient has been provided with contact information for the care management team and has been advised to call with any health related questions or concerns.       Medication Assistance: None required.  Patient affirms current coverage meets needs.  Compliance/Adherence/Medication fill history: Care Gaps: TDAP Pneumonia Vaccine  Shingrix Vaccine  Star-Rating Drugs: Atorvastatin 80 mg tablet   Patient's preferred pharmacy is:  [facility] [place] [address] Phone: [phone] Fax: [place] [address], [place]  [address] Phone: [phone] Fax: [phone]  [facility] - [place], [place] - [address] [place] [address] Phone: [phone] Fax: [phone]  [facility] - [place], [place] - [address] [place] [address] Phone: [phone] Fax: [phone]  [id number] - [place], [place] - [address] [place] [address] Phone: [phone] Fax: [phone]  Uses pill box? Yes Pt endorses 90% compliance  We discussed: Benefits of medication synchronization, packaging and delivery as well as enhanced pharmacist oversight with [facility]. Patient decided to: Continue current medication management strategy  Care Plan and Follow Up Patient Decision:  Patient agrees to Care Plan and Follow-up.  Plan: The patient has been provided with contact information for the care management team and has been advised to call with any health related questions or concerns.   [name], CPP, PharmD Clinical Pharmacist Practitioner [facility] [phone]

## 2022-02-07 NOTE — Chronic Care Management (AMB) (Signed)
    Chronic Care Management Pharmacy Assistant   Name: Kimberly [name]  MRN: [id number] DOB: 1956/06/20   Reason for Encounter: Medication Review   Called [facility] to check on Alendronate dispensing history, medication was filled 10/07/2021 and check out for delivery on 10/10/2021 for a 84 day supply.  Pharm D Spoke with sister [name] today, per sister patient has not started medication. Called sister [name] to inform, no answer, left message to return call.   Spoke with [name], Pharm D gave updated information, if patient has received she is due for a refill, should have been filled 12/30/2021. Patient has a refill left on 10/07/2021 prescription, given authorization from Pharm D to call [facility] to fill Alendronate for delivery, will update sister when she returns call.   Medications: Outpatient Encounter Medications as of 02/07/2022  Medication Sig   alendronate (FOSAMAX) 70 MG tablet Take 1 tablet (70 mg total) by mouth every 7 (seven) days. Take with a full glass of water on an empty stomach.   amLODipine (NORVASC) 5 MG tablet Take 1 tablet (5 mg total) by mouth daily.   atorvastatin (LIPITOR) 80 MG tablet TAKE 1 TABLET BY MOUTH EVERY DAY   carvedilol (COREG) 6.25 MG tablet TAKE 1 TABLET BY MOUTH 2 TIMES DAILY WITH A meal   docusate sodium (COLACE) 100 MG capsule Take 1 capsule (100 mg total) by mouth 2 (two) times daily as needed for mild constipation.   ELIQUIS 5 MG TABS tablet TAKE 1 TABLET BY MOUTH 2 TIMES DAILY   FEROSUL 325 (65 Fe) MG tablet TAKE 1 TABLET BY MOUTH EVERY DAY WITH BREAKFAST   hydrocortisone cream 1 % Apply 1 application topically daily as needed for itching.   mirtazapine (REMERON) 15 MG tablet TAKE 1 TABLET BY MOUTH AT BEDTIME   nicotine (NICODERM CQ - DOSED IN MG/24 HOURS) 21 mg/24hr patch Place 1 patch (21 mg total) onto the skin daily.   pantoprazole (PROTONIX) 40 MG tablet TAKE 1 TABLET BY MOUTH EVERY DAY   Tiotropium  Bromide-Olodaterol (STIOLTO RESPIMAT) 2.5-2.5 MCG/ACT AERS Inhale 2 puffs into the lungs daily.   [START ON 02/09/2022] Vitamin D, Ergocalciferol, (DRISDOL) 1.25 MG (50000 UNIT) CAPS capsule Take 1 capsule (50,000 Units total) by mouth 2 (two) times a week.   [DISCONTINUED] acetaminophen (TYLENOL) 325 MG tablet Take 2 tablets (650 mg total) by mouth every 6 (six) hours as needed for moderate pain, mild pain, fever or headache.   [DISCONTINUED] doxycycline (VIBRA-TABS) 100 MG tablet TAKE 1 TABLET BY MOUTH EVERY TWELVE HOURS (Patient not taking: Reported on 08/24/2021)   [DISCONTINUED] furosemide (LASIX) 20 MG tablet Take 20 mg by mouth daily x 3 days (Patient not taking: Reported on 08/24/2021)   [DISCONTINUED] Multiple Vitamin (MULTIVITAMIN) capsule Take 1 capsule by mouth daily.   [DISCONTINUED] Vitamin D, Ergocalciferol, (DRISDOL) 1.25 MG (50000 UNIT) CAPS capsule TAKE 1 CAPSULE BY MOUTH every 7 DAYS   No facility-administered encounter medications on file as of 02/07/2022.   [name], [place] Pharmacist Assistant [phone]

## 2022-02-08 ENCOUNTER — Other Ambulatory Visit: Payer: Self-pay | Admitting: Nurse Practitioner

## 2022-02-08 DIAGNOSIS — M810 Age-related osteoporosis without current pathological fracture: Secondary | ICD-10-CM

## 2022-02-09 ENCOUNTER — Encounter: Payer: Self-pay | Admitting: Emergency Medicine

## 2022-02-09 ENCOUNTER — Ambulatory Visit (INDEPENDENT_AMBULATORY_CARE_PROVIDER_SITE_OTHER): Payer: Medicare Other | Admitting: Emergency Medicine

## 2022-02-09 DIAGNOSIS — J449 Chronic obstructive pulmonary disease, unspecified: Secondary | ICD-10-CM

## 2022-02-09 DIAGNOSIS — R911 Solitary pulmonary nodule: Secondary | ICD-10-CM

## 2022-02-09 MED ORDER — ALBUTEROL SULFATE HFA 108 (90 BASE) MCG/ACT IN AERS: 2.0000 | INHALATION_SPRAY | Freq: Four times a day (QID) | RESPIRATORY_TRACT | 6 refills | Status: DC | PRN

## 2022-02-09 NOTE — Assessment & Plan Note (Signed)
Presumed COPD, no PFTs.  She is back to smoking a few cigarettes daily.  Discussed cessation with her.  She did not benefit from [employer].  At this time I think it would be reasonable to hold off on scheduled BD.  Much of her dyspnea is due to impaired overall functional capacity and deconditioning.  I will give her an albuterol inhaler that she can use as needed.

## 2022-02-09 NOTE — Progress Notes (Signed)
Subjective:    Patient ID: Kimberly [name], female    DOB: Jun 24, 1956, 66 y.o.   MRN: [id number]  HPI 66 year old woman with history of tobacco use (70 pack years), CAD/CABG with hypertension, atrial fibrillation on anticoagulation hyperlipidemia.  Also history of CVA x2 with residual left-sided weakness, GERD, ascending aortic aneurysm.  She carries a history of presumed COPD, no PFT available to review.  Not currently on any bronchodilator therapy.   She was hospitalized last month for near syncope and a fall.  She was treated with empiric antibiotics for possible sepsis.  Course complicated by transient atelectasis and some hypoxemia that resolved. She is still requiring assistance with all her activity, has not been using a walker. She has baseline SOB at rest and especially w transfers, activity. Can hear some wheeze at night. No cough.   She had a CT scan of the chest 12/18/2020 during that hospitalization which I have reviewed, shows aortic calcifications, of the ascending thoracic aortic dilation, enlarged PA, 5 mm noncalcified left lower lobe pulmonary nodule new from 09/2019.   ROV 02/09/22 --follow-up visit 66 year old woman with history tobacco use and presumed COPD.  She also has CAD/CABG, hypertension, A-fib, hyperlipidemia, CVA with residual weakness, GERD.  Finally she has a 5 mm left lower lobe noncalcified pulmonary nodule noted on CT chest 12/18/2020.  When I saw her last year we did a trial of Stiolto to see if she would get benefit.  Today she reports that she has restarted smoking about 3 cig a day. She did not really benefit from [facility] - stopped it.   CT chest 12/26/2021 reviewed by me, shows regression of the nodular component of a linear scar in the left lower lobe without any suspicious pulmonary nodules.  There are some scattered calcified nodules.  Reassuring study.     Review of Systems As per HPI  Past Medical History:  Diagnosis Date   Bicipital tenosynovitis     Coronary artery disease 05/2007   [id number]    Fall 02/13/2018   Family history of ischemic heart disease    Gout    Hammer toe    Hip, thigh, leg, and ankle, insect bite, nonvenomous, without mention of infection(916.4)    Hyperlipidemia    Hypertension    Osteoporosis 02/07/2022   Other abnormality of red blood cells    Rotator cuff syndrome of left shoulder    Stroke ([place])      Family History  Problem Relation Age of Onset   Hypertension Mother    Heart attack Father    Diabetes Father    Kidney failure Father    Anxiety disorder Sister    Sarcoidosis Sister      Social History   Socioeconomic History   Marital status: Single    Spouse name: Not on file   Number of children: 2   Years of education: Not on file   Highest education level: 11th grade  Occupational History   Occupation: retired  Tobacco Use   Smoking status: Former    Packs/day: 2.00    Years: 35.00    Total pack years: 70.00    Types: Cigarettes    Quit date: 08/12/2019    Years since quitting: 2.4   Smokeless tobacco: Former    Quit date: 08/09/2008   Tobacco comments:    Stopped smoking in May 2022 [facility]   Vaping Use   Vaping Use: Never used  Substance and Sexual Activity   Alcohol use: Not  Currently    Comment: ocassionally wine   Drug use: Not Currently    Types: Marijuana    Comment: history of cocaine use   Sexual activity: Not on file  Other Topics Concern   Not on file  Social History Narrative   Patient is right-handed. She lives alone in a one level home, 2 steps to enter. Her family is very active in her care.    Social Determinants of Health   Financial Resource Strain: High Risk (12/06/2018)   Overall Financial Resource Strain (CARDIA)    Difficulty of Paying Living Expenses: Hard  Food Insecurity: Food Insecurity Present (12/06/2018)   Hunger Vital Sign    Worried About Running Out of Food in the Last Year: Sometimes true    Ran Out of Food in the Last Year: Often true   Transportation Needs: Unmet Transportation Needs (12/06/2018)   PRAPARE - Hydrologist (Medical): Yes    Lack of Transportation (Non-Medical): Yes  Physical Activity: Inactive (12/06/2018)   Exercise Vital Sign    Days of Exercise per Week: 0 days    Minutes of Exercise per Session: 0 min  Stress: No Stress Concern Present (12/06/2018)   [place]    Feeling of Stress : Only a little  Social Connections: Unknown (12/06/2018)   Social Connection and Isolation Panel [NHANES]    Frequency of Communication with Friends and Family: Not asked    Frequency of Social Gatherings with Friends and Family: Not asked    Attends Religious Services: Not on file    Active Member of Clubs or Organizations: Not on file    Attends Archivist Meetings: Not on file    Marital Status: Not on file  Intimate Partner Violence: Not on file     Allergies  Allergen Reactions   Clonidine Derivatives Other (See Comments)    Patch causes scars   Spironolactone Rash     Outpatient Medications Prior to Visit  Medication Sig Dispense Refill   amLODipine (NORVASC) 5 MG tablet Take 1 tablet (5 mg total) by mouth daily. 90 tablet 3   atorvastatin (LIPITOR) 80 MG tablet TAKE 1 TABLET BY MOUTH EVERY DAY 90 tablet 2   carvedilol (COREG) 6.25 MG tablet TAKE 1 TABLET BY MOUTH 2 TIMES DAILY WITH A meal 60 tablet 2   docusate sodium (COLACE) 100 MG capsule Take 1 capsule (100 mg total) by mouth 2 (two) times daily as needed for mild constipation. 20 capsule 0   ELIQUIS 5 MG TABS tablet TAKE 1 TABLET BY MOUTH 2 TIMES DAILY 180 tablet 3   FEROSUL 325 (65 Fe) MG tablet TAKE 1 TABLET BY MOUTH EVERY DAY WITH BREAKFAST 30 tablet 8   hydrocortisone cream 1 % Apply 1 application topically daily as needed for itching.     mirtazapine (REMERON) 15 MG tablet TAKE 1 TABLET BY MOUTH AT BEDTIME 30 tablet 2   nicotine (NICODERM CQ -  DOSED IN MG/24 HOURS) 21 mg/24hr patch Place 1 patch (21 mg total) onto the skin daily. 30 patch 3   pantoprazole (PROTONIX) 40 MG tablet TAKE 1 TABLET BY MOUTH EVERY DAY 30 tablet 2   Vitamin D, Ergocalciferol, (DRISDOL) 1.25 MG (50000 UNIT) CAPS capsule Take 1 capsule (50,000 Units total) by mouth 2 (two) times a week. 24 capsule 1   alendronate (FOSAMAX) 70 MG tablet Take 1 tablet (70 mg total) by mouth every 7 (seven)  days. Take with a full glass of water on an empty stomach. (Patient not taking: Reported on 02/09/2022) 12 tablet 1   Tiotropium Bromide-Olodaterol (STIOLTO RESPIMAT) 2.5-2.5 MCG/ACT AERS Inhale 2 puffs into the lungs daily. (Patient not taking: Reported on 02/09/2022) 4 g 0   No facility-administered medications prior to visit.          Objective:   Physical Exam Vitals:   02/09/22 1123  BP: 98/60  Pulse: 65  SpO2: 91%  Weight: 116 lb 12.8 oz (53 kg)  Height: '5\' 7"'$  (1.702 m)    Gen: Pleasant, thin, in no distress,  normal affect, in a wheelchair  ENT: No lesions,  mouth clear,  oropharynx clear, no postnasal drip  Neck: No JVD, no stridor  Lungs: No use of accessory muscles, no crackles or wheezing on normal respiration, no wheeze on forced expiration  Cardiovascular: RRR, heart sounds normal, no murmur or gallops, no peripheral edema  Musculoskeletal: No deformities, no cyanosis or clubbing  Neuro: alert, awake, non focal  Skin: Warm, no lesions or rash      Assessment & Plan:  Pulmonary nodule Resolved, improved on her most recent CT.  She does not need any serial follow-up.  Discussed this with her today.  COPD (chronic obstructive pulmonary disease) (HCC) Presumed COPD, no PFTs.  She is back to smoking a few cigarettes daily.  Discussed cessation with her.  She did not benefit from [employer].  At this time I think it would be reasonable to hold off on scheduled BD.  Much of her dyspnea is due to impaired overall functional capacity and deconditioning.   I will give her an albuterol inhaler that she can use as needed.    Baltazar Apo, MD, PhD 02/09/2022, 11:40 AM [facility] [phone] or if no answer before 7:00PM call [phone] For any issues after 7:00PM please call eLink [phone]

## 2022-02-09 NOTE — Assessment & Plan Note (Signed)
Resolved, improved on her most recent CT.  She does not need any serial follow-up.  Discussed this with her today.

## 2022-02-09 NOTE — Patient Instructions (Signed)
We reviewed your CT scan of the chest today.  The pulmonary nodule in the left lung has improved.  We do not need to repeat your CT scan. We will not restart Stiolto at this time We will give you an albuterol inhaler.  You can use 2 puffs if you develop shortness of breath, chest tightness, wheezing. Follow Dr. Lamonte Sakai in 1 year or sooner if you have any problems.

## 2022-02-10 ENCOUNTER — Ambulatory Visit: Payer: Self-pay

## 2022-02-10 ENCOUNTER — Telehealth: Payer: Medicare Other

## 2022-02-10 DIAGNOSIS — I10 Essential (primary) hypertension: Secondary | ICD-10-CM

## 2022-02-10 DIAGNOSIS — F32A Depression, unspecified: Secondary | ICD-10-CM

## 2022-02-10 DIAGNOSIS — E1159 Type 2 diabetes mellitus with other circulatory complications: Secondary | ICD-10-CM | POA: Diagnosis not present

## 2022-02-10 DIAGNOSIS — I2510 Atherosclerotic heart disease of native coronary artery without angina pectoris: Secondary | ICD-10-CM | POA: Diagnosis not present

## 2022-02-10 DIAGNOSIS — R7303 Prediabetes: Secondary | ICD-10-CM

## 2022-02-10 DIAGNOSIS — F17210 Nicotine dependence, cigarettes, uncomplicated: Secondary | ICD-10-CM

## 2022-02-13 NOTE — Chronic Care Management (AMB) (Signed)
Chronic Care Management   CCM RN Visit Note  03/12/2022 Name: KEAYRA GRAHAM MRN: [id number] DOB: 08-14-1956  Subjective: Kimberly [name] is a 66 y.o. year old female who is a primary care patient of [name], [place]. The care management team was consulted for assistance with disease management and care coordination needs.    Engaged with patient by telephone for follow up visit in response to provider referral for case management and/or care coordination services.   Consent to Services:  The patient was given information about Chronic Care Management services, agreed to services, and gave verbal consent prior to initiation of services.  Please see initial visit note for detailed documentation.   Patient agreed to services and verbal consent obtained.   Assessment: Review of patient past medical history, allergies, medications, health status, including review of consultants reports, laboratory and other test data, was performed as part of comprehensive evaluation and provision of chronic care management services.   SDOH (Social Determinants of Health) assessments and interventions performed:  Yes, no acute needs   CCM Care Plan  Allergies  Allergen Reactions   Clonidine Derivatives Other (See Comments)    Patch causes scars   Spironolactone Rash    Outpatient Encounter Medications as of 02/10/2022  Medication Sig   albuterol (VENTOLIN HFA) 108 (90 Base) MCG/ACT inhaler Inhale 2 puffs into the lungs every 6 (six) hours as needed for wheezing or shortness of breath.   alendronate (FOSAMAX) 70 MG tablet TAKE 1 TABLET BY MOUTH EVERY 7 DAYS. Take WITH A full GLASS OF WATER ON EMPTY stomach   amLODipine (NORVASC) 5 MG tablet Take 1 tablet (5 mg total) by mouth daily.   atorvastatin (LIPITOR) 80 MG tablet TAKE 1 TABLET BY MOUTH EVERY DAY   carvedilol (COREG) 6.25 MG tablet TAKE 1 TABLET BY MOUTH 2 TIMES DAILY WITH A meal   docusate sodium (COLACE) 100 MG capsule Take 1 capsule  (100 mg total) by mouth 2 (two) times daily as needed for mild constipation.   ELIQUIS 5 MG TABS tablet TAKE 1 TABLET BY MOUTH 2 TIMES DAILY   FEROSUL 325 (65 Fe) MG tablet TAKE 1 TABLET BY MOUTH EVERY DAY WITH BREAKFAST   hydrocortisone cream 1 % Apply 1 application topically daily as needed for itching.   mirtazapine (REMERON) 15 MG tablet TAKE 1 TABLET BY MOUTH AT BEDTIME   nicotine (NICODERM CQ - DOSED IN MG/24 HOURS) 21 mg/24hr patch Place 1 patch (21 mg total) onto the skin daily.   pantoprazole (PROTONIX) 40 MG tablet TAKE 1 TABLET BY MOUTH EVERY DAY   Tiotropium Bromide-Olodaterol (STIOLTO RESPIMAT) 2.5-2.5 MCG/ACT AERS Inhale 2 puffs into the lungs daily. (Patient not taking: Reported on 02/09/2022)   Vitamin D, Ergocalciferol, (DRISDOL) 1.25 MG (50000 UNIT) CAPS capsule Take 1 capsule (50,000 Units total) by mouth 2 (two) times a week.   No facility-administered encounter medications on file as of 02/10/2022.    Patient Active Problem List   Diagnosis Date Noted   COPD (chronic obstructive pulmonary disease) ([place]) 02/09/2022   Osteoporosis 02/07/2022   Pulmonary nodule 01/25/2021   Shortness of breath 01/25/2021   Sepsis ([place]) 12/18/2020   Family history of malignant neoplasm of gastrointestinal tract 12/07/2020   Personal history of colonic polyps 12/07/2020   Secondary hypercoagulable state ([place]) 10/14/2020   Right sided abdominal pain    Abdominal wall pain    Atrial fibrillation ([place]) 08/19/2020   Educated about COVID-19 virus infection 11/16/2019   Urinary frequency  04/14/2019   Cough 04/14/2019   AKI (acute kidney injury) ([place]) 11/26/2018   Diarrhea 11/25/2018   Dehydration, mild 11/25/2018   Hemiparesis affecting nondominant 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 ([place]) 07/31/2018   Muscle ache 07/31/2018   Prediabetes 07/31/2018   Chronic gout without tophus 07/31/2018   Vision loss 07/31/2018   Malnutrition of moderate degree 02/15/2018   Carotid  occlusion, bilateral    Atrial fibrillation with RVR ([place]) 02/13/2018   Positive D dimer 02/13/2018   Back pain 02/13/2018   CVA (cerebral vascular accident) ([place]) 02/13/2018   Paroxysmal atrial fibrillation ([place]) 02/13/2018   Coronary artery disease involving native coronary artery of native heart without angina pectoris 02/13/2018   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HCC) 02/13/2018   Aortic atherosclerosis ([place]) 02/13/2018   CAD, ARTERY BYPASS GRAFT 05/03/2009   TOBACCO USE, QUIT 05/03/2009   ROTATOR CUFF SYNDROME, LEFT 02/12/2009   Shoulder pain, left 11/03/2008   HAMMER TOE, ACQUIRED 02/11/2008   INSECT BITE, LEG 02/11/2008   Cerebral artery occlusion with cerebral infarction ([place]) 12/05/2007   Dyslipidemia 11/05/2007   UNSPECIFIED DISORDER TEETH&SUPPORTING STRUCTURES 11/05/2007   BICEPS TENDINITIS, RIGHT 04/24/2007   Essential hypertension 04/23/2007   MICROCYTOSIS 04/23/2007    Conditions to be addressed/monitored: Essential hypertension, Prediabetes, Depression  Care Plan : RN Care Manager Plan of Care  Updates made by Lynne Logan, RN since 02/10/2022 12:00 AM     Problem: No Plan of Care established for management of chronic disease states (Essential hypertension, Prediabetes, Depression)   Priority: High     Long-Range Goal: Establishment of plan of care for management of chronic disease states (Essential hypertension, Prediabetes, Depression)   Start Date: 09/20/2021  Expected End Date: 09/20/2022  Recent Progress: On track  Priority: High  Note:   Current Barriers:  Knowledge Deficits related to plan of care for management of Essential hypertension, Prediabetes, Depression   Chronic Disease Management support and education needs related to Essential hypertension, Prediabetes, Depression    RNCM Clinical Goal(s):  Patient will verbalize basic understanding of  Essential hypertension, Prediabetes, Depression   disease process and self health management plan as evidenced by patient will report having no disease exacerbations related to her chronic disease states as listed above take all medications exactly as prescribed and will call provider for medication related questions as evidenced by demonstrate improved understanding of prescribed medications and rationale for usage as evidenced by patient teach back continue to work with RN Care Manager to address care management and care coordination needs related to  Essential hypertension, Prediabetes, Depression  as evidenced by adherence to CM Team Scheduled appointments demonstrate ongoing self health care management ability   as evidenced by    through collaboration with RN Care manager, provider, and care team.   Interventions: 1:1 collaboration with primary care provider regarding development and update of comprehensive plan of care as evidenced by provider attestation and co-signature Inter-disciplinary care team collaboration (see longitudinal plan of care) Evaluation of current treatment plan related to  self management and patient's adherence to plan as established by provider  Diabetes Interventions:  (Status:  Goal on track:  Yes.) Long Term Goal Successful outbound follow up call completed with sister [name] Assessed patient's understanding of A1c goal:  <5.7 % Provided education to patient about basic DM disease process Counseled on importance of regular laboratory monitoring as prescribed Review of patient status, including review of consultants reports, relevant laboratory and other  test results, and medications completed Assessed social determinant of health barriers Educated sister regarding dietary and exercise recommendations, including how to use the plate method and portion control Educated on potential complications related to uncontrolled diabetes and risk for disease progression  Mailed printed educational materials related to Meal  planning, Carb Choice list; Chair exercises; Diabetes Care Schedule; What is prediabetes? (Mailed to sister [name]'s home address as discussed) Lab Results  Component Value Date   HGBA1C 6.0 (H) 08/10/2021  Depression Interventions:  (Status:  Goal Met.)  Long Term Goal Evaluation of current treatment plan related to Depression, self-management and patient's adherence to plan as established by provider Completed successful outbound call with daughter [name]  Determined patient's cognition and mood are stable Discussed patient's daughter is currently taking time off work and is staying with Ms. Imm to help care for her, this has improved patient's mood Discussed the family is visiting patient more often and taking her for Sunday drives as well as getting her out of the house more often for dinner dates Encouraged sister [name] to continue to monitor patient's condition and notify the CCM team and the PCP of any/all concerns  Discussed plans with patient for ongoing care management follow up and provided patient with direct contact information for care management team  Hypertension Interventions:  (Status:  Goal on track:  Yes.) Long Term Goal Last practice recorded BP readings:  BP Readings from Last 3 Encounters:  02/09/22 98/60  08/24/21 (!) 70/60  08/10/21 122/80  Most recent eGFR/CrCl:  Lab Results  Component Value Date   EGFR 68 08/10/2021    No components found for: "CRCL" Evaluation of current treatment plan related to hypertension self management and patient's adherence to plan as established by provider Determined patient's family continues to monitor her BP at home as directed Reviewed and discussed target BP set by PCP, patient continues to run lower systolic and diastolic BP, remains asymptomatic, sister voices no concerns Advised patient, providing education and rationale, to monitor blood pressure daily and record, calling PCP for findings outside established  parameters Reiterated to sister importance of patient staying well hydrated, aiming for 48-64 oz daily unless otherwise directed by her doctor  Determined patient missed her last scheduled PCP office visit, rescheduled PCP follow up for 03/06/22 @12  PM   Vitamin D deficiency Interventions:  (Status:  Condition stable.  Not addressed this visit.)  Long Term Goal Evaluation of current treatment plan related to Vitamin D deficiency self-management and patient's adherence to plan as established by provider Review of patient status, including review of consultant's reports, relevant laboratory and other test results, and medications completed Reviewed medications with caregiver, including the new Rx for Vitamin D and discussed importance of medication adherence Educated sister [name] on ways to improve Vitamin D, take supplement exactly as prescribed, eat a Vitamin D rich diet, try to get at least 15 minutes of natural sunlight daily when possible  Discussed plans with patient for ongoing care management follow up and provided patient with direct contact information for care management team Vit D, 25-Hydroxy 30.0 - 100.0 ng/mL 15.6 Low    Completed inbound call with sister [name] to advise the new Rx is needed for the increased dose of Vitamin D Determined the pharmacy did not receive the new Rx from PCP and patient is only taking Vitamin D 50,000 once weekly Sent in basket message to PCP provider [name] FNP requesting a new Rx for the correct dosage be sent to  patient's preferred pharmacy Sent in basket message to embedded Pharm D requesting information for cost of pill packaging via [place] with patient for ongoing care management follow up and provided patient with direct contact information for care management team  Smoking Cessation Interventions:  (Status:  Goal Not Met.)  Long Term Goal Evaluation of current treatment plan related to  Smoking Cessation ,  self-management and patient's adherence to plan as established by provider Determined patient has resumed smoking cigarettes, per sister she no longer wishes to stop smoking  Discussed plans with patient for ongoing care management follow up and provided patient with direct contact information for care management team  AFIB Interventions: (Status:  New goal.) Long Term Goal Counseled on increased risk of stroke due to Afib and benefits of anticoagulation for stroke prevention Reviewed importance of adherence to anticoagulant exactly as prescribed Counseled on avoidance of NSAIDs due to increased bleeding risk with anticoagulants Reviewed and discussed with sister [name], Cardiology recommendations and follow up needed Determined patients last follow up had been cancelled and family forgot to reschedule Provided sister [name] with the contact number for Cardiology and discussed she will call to schedule a follow up Discussed plans with patient for ongoing care management follow up and provided patient with direct contact information for care management team   Patient Goals/Self-Care Activities: Take all medications as prescribed Attend all scheduled provider appointments Call pharmacy for medication refills 3-7 days in advance of running out of medications Call provider office for new concerns or questions  drink 6 to 8 glasses of water each day fill half of plate with vegetables manage portion size write blood pressure results in a log or diary take blood pressure log to all doctor appointments call doctor for signs and symptoms of low blood pressure take medications for blood pressure exactly as prescribed report new symptoms to your doctor  Follow Up Plan:  Telephone follow up appointment with care management team member scheduled for:  03/08/22      Barb Merino, RN, BSN, CCM Care Management Coordinator [facility]  Direct Phone:  [phone]

## 2022-02-13 NOTE — Patient Instructions (Signed)
Visit Information  Thank you for taking time to visit with me today. Please don't hesitate to contact me if I can be of assistance to you before our next scheduled telephone appointment.  Following are the goals we discussed today:  Take all medications as prescribed Attend all scheduled provider appointments Call pharmacy for medication refills 3-7 days in advance of running out of medications Call provider office for new concerns or questions  drink 6 to 8 glasses of water each day fill half of plate with vegetables manage portion size write blood pressure results in a log or diary take blood pressure log to all doctor appointments call doctor for signs and symptoms of low blood pressure take medications for blood pressure exactly as prescribed report new symptoms to your doctor  Our next appointment is by telephone on 03/08/22 at 09:30 AM   Please call the care guide team at [phone] if you need to cancel or reschedule your appointment.   If you are experiencing a Mental Health or [place] or need someone to talk to, please call 1-800-273-TALK (toll free, 24 hour hotline)   Patient verbalizes understanding of instructions and care plan provided today and agrees to view in [place]. Active MyChart status and patient understanding of how to access instructions and care plan via MyChart confirmed with patient.     Barb Merino, RN, BSN, CCM Care Management Coordinator [facility] [place] [facility]  Direct Phone: [phone]

## 2022-02-14 NOTE — Progress Notes (Unsigned)
Cardiology Office Note:    Date:  02/15/2022   ID:  AMEERA [name], DOB 12-19-1955, MRN [id number]  PCP:  [name], [place] Cardiologist: [name], MD   Reason for visit: 50-monthfollow-up  History of Present Illness:    Kimberly VANDUYNis a 66y.o. female with a hx of CVA with PCA subacute infarct in 2019, atrial fibrillation, CAD s/p CABGx3 in 2008 and tobacco use.  She was last seen by Dr. HPercival Spanishin January 2023.  She was getting around the house slowly since her stroke.  She ambulates with a walker.  No palpitations, chest pain or shortness of breath.  With low blood pressure, her hydralazine was discontinued and amlodipine decreased to 5 mg daily.  Today she is accompanied by her sister.  Patient's sister and daughter help care for her.  Patient is tearful when I walk in the room.  Her sister mentions that her caregiver who has been with her over the last 18 minutes just quit over the weekend.  Otherwise patient notes recent weight loss.  Her sister thinks is related to patient picking back smoking.  Currently smoking 2-3 cigarettes/day.  Blood pressure improved since stopping hydralazine and decreasing amlodipine -- on average, BP has been 120s over 80s at home.  Occasional lightheaded, no presyncope.  Denies chest pain.  She has occasional palpitations.  She was previously recommended to have a cardioversion but the family decided not to proceed.    Notes dyspnea on exertion with occasional wheezing.  She saw Dr. BLamonte Sakaiwith pulm last week.  She has presumed COPD who commented that "much of her dyspnea is due to impaired overall functional capacity and deconditioning."  She was given an albuterol inhaler that she can use as needed.        Past Medical History:  Diagnosis Date   Bicipital tenosynovitis    Coronary artery disease 05/2007   [id number]    Fall 02/13/2018   Family history of ischemic heart disease    Gout    Hammer toe    Hip, thigh,  leg, and ankle, insect bite, nonvenomous, without mention of infection(916.4)    Hyperlipidemia    Hypertension    Osteoporosis 02/07/2022   Other abnormality of red blood cells    Rotator cuff syndrome of left shoulder    [facility]     Past Surgical History:  Procedure Laterality Date   CARDIAC CATHETERIZATION     COLONOSCOPY WITH PROPOFOL N/A 01/30/2020   Procedure: COLONOSCOPY WITH PROPOFOL;  Surgeon: HCarol Ada MD;  Location: [facility];  Service: Endoscopy;  Laterality: N/A;   CORONARY ARTERY BYPASS GRAFT     triple   CORONARY ARTERY BYPASS GRAFT  2008   POLYPECTOMY  01/30/2020   Procedure: POLYPECTOMY;  Surgeon: HCarol Ada MD;  Location: [facility];  Service: Endoscopy;;   TUBAL LIGATION      Current Medications: Current Meds  Medication Sig   albuterol (VENTOLIN HFA) 108 (90 Base) MCG/ACT inhaler Inhale 2 puffs into the lungs every 6 (six) hours as needed for wheezing or shortness of breath.   alendronate (FOSAMAX) 70 MG tablet TAKE 1 TABLET BY MOUTH EVERY 7 DAYS. Take WITH A full GLASS OF WATER ON EMPTY stomach   amLODipine (NORVASC) 5 MG tablet Take 1 tablet (5 mg total) by mouth daily.   atorvastatin (LIPITOR) 80 MG tablet TAKE 1 TABLET BY MOUTH EVERY DAY   carvedilol (COREG) 6.25 MG tablet TAKE 1 TABLET  BY MOUTH 2 TIMES DAILY WITH A meal   docusate sodium (COLACE) 100 MG capsule Take 1 capsule (100 mg total) by mouth 2 (two) times daily as needed for mild constipation.   ELIQUIS 5 MG TABS tablet TAKE 1 TABLET BY MOUTH 2 TIMES DAILY   FEROSUL 325 (65 Fe) MG tablet TAKE 1 TABLET BY MOUTH EVERY DAY WITH BREAKFAST   hydrocortisone cream 1 % Apply 1 application topically daily as needed for itching.   mirtazapine (REMERON) 15 MG tablet TAKE 1 TABLET BY MOUTH AT BEDTIME   nicotine (NICODERM CQ - DOSED IN MG/24 HOURS) 14 mg/24hr patch Place 1 patch (14 mg total) onto the skin daily.   nicotine (NICODERM CQ - DOSED IN MG/24 HR) 7 mg/24hr patch Place 1 patch (7 mg  total) onto the skin daily. Start After completed 28 days of 14 mg patch.   pantoprazole (PROTONIX) 40 MG tablet TAKE 1 TABLET BY MOUTH EVERY DAY   Tiotropium Bromide-Olodaterol (STIOLTO RESPIMAT) 2.5-2.5 MCG/ACT AERS Inhale 2 puffs into the lungs daily.   Vitamin D, Ergocalciferol, (DRISDOL) 1.25 MG (50000 UNIT) CAPS capsule Take 1 capsule (50,000 Units total) by mouth 2 (two) times a week.   [DISCONTINUED] nicotine (NICODERM CQ - DOSED IN MG/24 HOURS) 21 mg/24hr patch Place 1 patch (21 mg total) onto the skin daily.     Allergies:   Clonidine derivatives and Spironolactone   Social History   Socioeconomic History   Marital status: Single    Spouse name: Not on file   Number of children: 2   Years of education: Not on file   Highest education level: 11th grade  Occupational History   Occupation: retired  Tobacco Use   Smoking status: Former    Packs/day: 2.00    Years: 35.00    Total pack years: 70.00    Types: Cigarettes    Quit date: 08/12/2019    Years since quitting: 2.5   Smokeless tobacco: Former    Quit date: 08/09/2008   Tobacco comments:    Stopped smoking in May 2022 [facility]   Vaping Use   Vaping Use: Never used  Substance and Sexual Activity   Alcohol use: Not Currently    Comment: ocassionally wine   Drug use: Not Currently    Types: Marijuana    Comment: history of cocaine use   Sexual activity: Not on file  Other Topics Concern   Not on file  Social History Narrative   Patient is right-handed. She lives alone in a one level home, 2 steps to enter. Her family is very active in her care.    Social Determinants of Health   Financial Resource Strain: High Risk (12/06/2018)   Overall Financial Resource Strain (CARDIA)    Difficulty of Paying Living Expenses: Hard  Food Insecurity: Food Insecurity Present (12/06/2018)   Hunger Vital Sign    Worried About Running Out of Food in the Last Year: Sometimes true    Ran Out of Food in the Last Year: Often true   Transportation Needs: Unmet Transportation Needs (12/06/2018)   PRAPARE - Hydrologist (Medical): Yes    Lack of Transportation (Non-Medical): Yes  Physical Activity: Inactive (12/06/2018)   Exercise Vital Sign    Days of Exercise per Week: 0 days    Minutes of Exercise per Session: 0 min  Stress: No Stress Concern Present (12/06/2018)   [place]    Feeling of  Stress : Only a little  Social Connections: Unknown (12/06/2018)   Social Connection and Isolation Panel [NHANES]    Frequency of Communication with Friends and Family: Not asked    Frequency of Social Gatherings with Friends and Family: Not asked    Attends Religious Services: Not on file    Active Member of Clubs or Organizations: Not on file    Attends Archivist Meetings: Not on file    Marital Status: Not on file     Family History: The patient's family history includes Anxiety disorder in her sister; Diabetes in her father; Heart attack in her father; Hypertension in her mother; Kidney failure in her father; Sarcoidosis in her sister.  ROS:   Please see the history of present illness.     EKGs/Labs/Other Studies Reviewed:    Recent Labs: 08/10/2021: ALT 19; BUN 15; Creatinine, Ser 0.93; Hemoglobin 11.8; Platelets 317; Potassium 3.7; Sodium 142   Recent Lipid Panel Lab Results  Component Value Date/Time   CHOL 124 08/10/2021 12:08 PM   TRIG 79 08/10/2021 12:08 PM   HDL 49 08/10/2021 12:08 PM   LDLCALC 59 08/10/2021 12:08 PM    Physical Exam:    VS:  BP (!) 124/94   Pulse 76   Ht '5\' 7"'$  (1.702 m)   Wt 116 lb (52.6 kg)   SpO2 93%   BMI 18.17 kg/m    No data found.  Wt Readings from Last 3 Encounters:  02/15/22 116 lb (52.6 kg)  02/09/22 116 lb 12.8 oz (53 kg)  08/24/21 117 lb 9.6 oz (53.3 kg)     GEN:  Thin, in no acute distress, tearful, in wheelchair HEENT: Normal NECK: No JVD; No carotid  bruits CARDIAC: Irreg Irreg RESPIRATORY:  Clear to auscultation without rales, wheezing or rhonchi  ABDOMEN: Soft, non-tender, non-distended MUSCULOSKELETAL: No edema; No deformity  SKIN: Warm and dry NEUROLOGIC:  Alert and oriented PSYCHIATRIC:  Normal affect     ASSESSMENT AND PLAN   Coronary artery disease, no chest pain -CABG x3 in 2008 -Recommend tobacco cessation.  Sent request for nicotine patches -14 mg patch for 20 days followed by 7 mg patch for 20 days. -No aspirin given need for anticoagulation. -Continue Lipitor and beta-blocker.  Carotid stenosis -known occluded bilateral ICAs - medically managing -Continue lipid therapy.  Recommend tobacco cessation. -I would presume we should keep her systolic blood pressure over 120.  Asked patient and sister to monitor blood pressure, if systolic blood pressure consistently less than 120, would recommend decreasing amlodipine dose.  Atrial fibrillation, rate controlled -Seen by A-fib clinic 10/2020 --- patient did not proceed with recommended DCCV; noted to avoid class 1C with h/o CAD. Also hesitant to use sotalol or dofetilide with QT 460s-480s in SR. -Continue carvedilol for rate control. -Continue Eliquis for stroke prophylaxis -no bleeding issues  Hypertension, controlled -Systolic blood pressure averaging 120s per sister. -Continue amlodipine and carvedilol.  Hyperlipidemia with goal LDL less than 70 -LDL 59 in December 2022.  Continue Lipitor.  Tobacco use  -Recommend tobacco cessation.  Reviewed physiologic effects of nicotine and the immediate-eventual benefits of quitting including improvement in cough/breathing and reduction in cardiovascular events.  Discussed quitting tips such as removing triggers and getting support from family/friends and Quitline [place].  Disposition - Follow-up in 6 months with Dr. Percival Spanish.   Medication Adjustments/Labs and Tests Ordered: Current medicines are reviewed at length with the  patient today.  Concerns regarding medicines are outlined above.  No orders  of the defined types were placed in this encounter.  Meds ordered this encounter  Medications   nicotine (NICODERM CQ - DOSED IN MG/24 HOURS) 14 mg/24hr patch    Sig: Place 1 patch (14 mg total) onto the skin daily.    Dispense:  28 patch    Refill:  0   nicotine (NICODERM CQ - DOSED IN MG/24 HR) 7 mg/24hr patch    Sig: Place 1 patch (7 mg total) onto the skin daily. Start After completed 28 days of 14 mg patch.    Dispense:  28 patch    Refill:  0    Patient Instructions  Medication Instructions:  Start Nicoderm Patch 14 mg ( Place 1 Patch on Skin Daily). *If you need a refill on your cardiac medications before your next appointment, please call your pharmacy*   Lab Work: No Labs If you have labs (blood work) drawn today and your tests are completely normal, you will receive your results only by: [place] (if you have MyChart) OR A paper copy in the mail If you have any lab test that is abnormal or we need to change your treatment, we will call you to review the results.   Testing/Procedures: No Testing   Follow-Up: At [facility], you and your health needs are our priority.  As part of our continuing mission to provide you with exceptional heart care, we have created designated Provider Care Teams.  These Care Teams include your primary Cardiologist (physician) and Advanced Practice Providers (APPs -  Physician Assistants and Nurse Practitioners) who all work together to provide you with the care you need, when you need it.  We recommend signing up for the patient portal called "MyChart".  Sign up information is provided on this After Visit Summary.  MyChart is used to connect with patients for Virtual Visits (Telemedicine).  Patients are able to view lab/test results, encounter notes, upcoming appointments, etc.  Non-urgent messages can be sent to your provider as well.   To learn more about  what you can do with MyChart, go to [email or web address].    Your next appointment:   6 month(s)  The format for your next appointment:   In Person  Provider:   [name], MD     Other Instructions Monitor Blood Pressure Twice Weekly. Call office if Systolic Blood Pressure Less Than 120.  Important Information About Sugar         Signed, Gaston Islam  02/15/2022 12:50 PM    [facility]

## 2022-02-15 ENCOUNTER — Encounter: Payer: Self-pay | Admitting: Physician Assistant

## 2022-02-15 ENCOUNTER — Ambulatory Visit (INDEPENDENT_AMBULATORY_CARE_PROVIDER_SITE_OTHER): Payer: Medicare Other | Admitting: Physician Assistant

## 2022-02-15 VITALS — BP 124/94 | HR 76 | Ht 67.0 in | Wt 116.0 lb

## 2022-02-15 DIAGNOSIS — I2510 Atherosclerotic heart disease of native coronary artery without angina pectoris: Secondary | ICD-10-CM

## 2022-02-15 DIAGNOSIS — Z720 Tobacco use: Secondary | ICD-10-CM

## 2022-02-15 DIAGNOSIS — I10 Essential (primary) hypertension: Secondary | ICD-10-CM

## 2022-02-15 DIAGNOSIS — I480 Paroxysmal atrial fibrillation: Secondary | ICD-10-CM

## 2022-02-15 DIAGNOSIS — I6523 Occlusion and stenosis of bilateral carotid arteries: Secondary | ICD-10-CM | POA: Diagnosis not present

## 2022-02-15 DIAGNOSIS — E785 Hyperlipidemia, unspecified: Secondary | ICD-10-CM

## 2022-02-15 MED ORDER — NICOTINE 14 MG/24HR TD PT24: 14.0000 mg | MEDICATED_PATCH | Freq: Every day | TRANSDERMAL | 0 refills | Status: DC

## 2022-02-15 MED ORDER — NICOTINE 7 MG/24HR TD PT24
7.0000 mg | MEDICATED_PATCH | Freq: Every day | TRANSDERMAL | 0 refills | Status: DC
Start: 2022-02-15 — End: 2022-06-24

## 2022-02-15 NOTE — Patient Instructions (Signed)
Medication Instructions:  Start Nicoderm Patch 14 mg ( Place 1 Patch on Skin Daily). *If you need a refill on your cardiac medications before your next appointment, please call your pharmacy*   Lab Work: No Labs If you have labs (blood work) drawn today and your tests are completely normal, you will receive your results only by: [place] (if you have MyChart) OR A paper copy in the mail If you have any lab test that is abnormal or we need to change your treatment, we will call you to review the results.   Testing/Procedures: No Testing   Follow-Up: At [facility], you and your health needs are our priority.  As part of our continuing mission to provide you with exceptional heart care, we have created designated Provider Care Teams.  These Care Teams include your primary Cardiologist (physician) and Advanced Practice Providers (APPs -  Physician Assistants and Nurse Practitioners) who all work together to provide you with the care you need, when you need it.  We recommend signing up for the patient portal called "MyChart".  Sign up information is provided on this After Visit Summary.  MyChart is used to connect with patients for Virtual Visits (Telemedicine).  Patients are able to view lab/test results, encounter notes, upcoming appointments, etc.  Non-urgent messages can be sent to your provider as well.   To learn more about what you can do with MyChart, go to [email or web address].    Your next appointment:   6 month(s)  The format for your next appointment:   In Person  Provider:   [name], MD     Other Instructions Monitor Blood Pressure Twice Weekly. Call office if Systolic Blood Pressure Less Than 120.  Important Information About Sugar

## 2022-02-21 ENCOUNTER — Other Ambulatory Visit: Payer: Self-pay | Admitting: Cardiology

## 2022-03-03 ENCOUNTER — Ambulatory Visit: Payer: Medicare Other | Admitting: Podiatry

## 2022-03-06 ENCOUNTER — Ambulatory Visit: Payer: Medicaid Other | Admitting: Nurse Practitioner

## 2022-03-08 ENCOUNTER — Telehealth: Payer: Self-pay

## 2022-03-08 ENCOUNTER — Telehealth: Payer: Medicare Other

## 2022-03-08 NOTE — Telephone Encounter (Signed)
  Care Management   Follow Up Note   03/08/2022 Name: Kimberly [name] MRN: [id number] DOB: Dec 21, 1955   Referred by: [name], FNP Reason for referral : Care Coordination (RN CC follow up call )   An unsuccessful telephone outreach was attempted today. The patient was referred to the case management team for assistance with care management and care coordination.   Follow Up Plan: A HIPPA compliant phone message was left for the patient providing contact information and requesting a return call.   Barb Merino, RN, BSN, CCM Care Management Coordinator [facility]  Direct Phone: [phone]

## 2022-03-21 ENCOUNTER — Encounter: Payer: Self-pay | Admitting: Nurse Practitioner

## 2022-03-21 ENCOUNTER — Ambulatory Visit: Payer: 59 | Admitting: Nurse Practitioner

## 2022-03-21 VITALS — BP 110/80 | HR 78 | Temp 98.1°F | Ht 67.0 in | Wt 116.2 lb

## 2022-03-21 DIAGNOSIS — K089 Disorder of teeth and supporting structures, unspecified: Secondary | ICD-10-CM

## 2022-03-21 DIAGNOSIS — Z79899 Other long term (current) drug therapy: Secondary | ICD-10-CM

## 2022-03-21 DIAGNOSIS — R7303 Prediabetes: Secondary | ICD-10-CM | POA: Diagnosis not present

## 2022-03-21 DIAGNOSIS — I25708 Atherosclerosis of coronary artery bypass graft(s), unspecified, with other forms of angina pectoris: Secondary | ICD-10-CM

## 2022-03-21 DIAGNOSIS — I119 Hypertensive heart disease without heart failure: Secondary | ICD-10-CM | POA: Diagnosis not present

## 2022-03-21 DIAGNOSIS — E440 Moderate protein-calorie malnutrition: Secondary | ICD-10-CM

## 2022-03-21 DIAGNOSIS — I69359 Hemiplegia and hemiparesis following cerebral infarction affecting unspecified side: Secondary | ICD-10-CM

## 2022-03-21 DIAGNOSIS — Z23 Encounter for immunization: Secondary | ICD-10-CM | POA: Diagnosis not present

## 2022-03-21 DIAGNOSIS — Z993 Dependence on wheelchair: Secondary | ICD-10-CM

## 2022-03-21 DIAGNOSIS — I10 Essential (primary) hypertension: Secondary | ICD-10-CM

## 2022-03-21 DIAGNOSIS — M810 Age-related osteoporosis without current pathological fracture: Secondary | ICD-10-CM

## 2022-03-21 LAB — CMP14+EGFR
ALT: 30 IU/L (ref 0–32)
AST: 21 IU/L (ref 0–40)
Albumin/Globulin Ratio: 1.9 (ref 1.2–2.2)
Albumin: 4.4 g/dL (ref 3.9–4.9)
Alkaline Phosphatase: 98 IU/L (ref 44–121)
BUN/Creatinine Ratio: 14 (ref 12–28)
BUN: 15 mg/dL (ref 8–27)
Bilirubin Total: 0.6 mg/dL (ref 0.0–1.2)
CO2: 17 mmol/L — ABNORMAL LOW (ref 20–29)
Calcium: 9.4 mg/dL (ref 8.7–10.3)
Chloride: 108 mmol/L — ABNORMAL HIGH (ref 96–106)
Creatinine, Ser: 1.1 mg/dL — ABNORMAL HIGH (ref 0.57–1.00)
Globulin, Total: 2.3 g/dL (ref 1.5–4.5)
Glucose: 130 mg/dL — ABNORMAL HIGH (ref 70–99)
Potassium: 3.8 mmol/L (ref 3.5–5.2)
Sodium: 142 mmol/L (ref 134–144)
Total Protein: 6.7 g/dL (ref 6.0–8.5)
eGFR: 56 mL/min/{1.73_m2} — ABNORMAL LOW (ref >59)

## 2022-03-21 LAB — HEMOGLOBIN A1C
Est. average glucose Bld gHb Est-mCnc: 131 mg/dL
Hgb A1c MFr Bld: 6.2 % — ABNORMAL HIGH (ref 4.8–5.6)

## 2022-03-21 LAB — LIPID PANEL
Chol/HDL Ratio: 2.4 ratio (ref 0.0–4.4)
Cholesterol, Total: 111 mg/dL (ref 100–199)
HDL: 47 mg/dL (ref >39)
LDL Chol Calc (NIH): 47 mg/dL (ref 0–99)
Triglycerides: 86 mg/dL (ref 0–149)
VLDL Cholesterol Cal: 17 mg/dL (ref 5–40)

## 2022-03-21 LAB — CBC
Hematocrit: 42.2 % (ref 34.0–46.6)
Hemoglobin: 12.5 g/dL (ref 11.1–15.9)
MCH: 22.3 pg — ABNORMAL LOW (ref 26.6–33.0)
MCHC: 29.6 g/dL — ABNORMAL LOW (ref 31.5–35.7)
MCV: 75 fL — ABNORMAL LOW (ref 79–97)
Platelets: 247 10*3/uL (ref 150–450)
RBC: 5.61 x10E6/uL — ABNORMAL HIGH (ref 3.77–5.28)
RDW: 17.6 % — ABNORMAL HIGH (ref 11.7–15.4)
WBC: 5.4 10*3/uL (ref 3.4–10.8)

## 2022-03-21 MED ORDER — ZOSTER VAC RECOMB ADJUVANTED 50 MCG/0.5ML IM SUSR: 0.5000 mL | Freq: Once | INTRAMUSCULAR | 0 refills | Status: AC

## 2022-03-21 NOTE — Progress Notes (Unsigned)
I,Victoria T Hamilton,acting as a Education administrator for [name], FNP.,have documented all relevant documentation on the behalf of [name], FNP,as directed by  [name], FNP while in the presence of [name], [place].    Subjective:     Patient ID: Kimberly [name] , female    DOB: 03-02-1956 , 66 y.o.   MRN: [id number]   Chief Complaint  Patient presents with   Hypertension   Diabetes    HPI  Patient presents today with her sister for bp & dm f/u.   Hypertension This is a chronic problem. The current episode started more than 1 year ago. The problem is controlled. Pertinent negatives include no anxiety, chest pain, headaches, malaise/fatigue, palpitations or shortness of breath. Risk factors for coronary artery disease include smoking/tobacco exposure. Past treatments include calcium channel blockers. There are no compliance problems.  Hypertensive end-organ damage includes CVA. There is no history of angina or kidney disease. There is no history of chronic renal disease.  Diabetes Pertinent negatives for hypoglycemia include no headaches. Pertinent negatives for diabetes include no chest pain. Diabetic complications include a CVA.     Past Medical History:  Diagnosis Date   Bicipital tenosynovitis    Coronary artery disease 05/2007   cabgx4    Fall 02/13/2018   Family history of ischemic heart disease    Gout    Hammer toe    Hip, thigh, leg, and ankle, insect bite, nonvenomous, without mention of infection(916.4)    Hyperlipidemia    Hypertension    Osteoporosis 02/07/2022   Other abnormality of red blood cells    Rotator cuff syndrome of left shoulder    Stroke ([place])      Family History  Problem Relation Age of Onset   Hypertension Mother    Heart attack Father    Diabetes Father    Kidney failure Father    Anxiety disorder Sister    Sarcoidosis Sister      Current Outpatient Medications:    albuterol (VENTOLIN HFA) 108 (90 Base) MCG/ACT inhaler, Inhale 2  puffs into the lungs every 6 (six) hours as needed for wheezing or shortness of breath., Disp: 8 g, Rfl: 6   alendronate (FOSAMAX) 70 MG tablet, TAKE 1 TABLET BY MOUTH EVERY 7 DAYS. Take WITH A full GLASS OF WATER ON EMPTY stomach, Disp: 12 tablet, Rfl: 1   amLODipine (NORVASC) 5 MG tablet, Take 1 tablet (5 mg total) by mouth daily., Disp: 90 tablet, Rfl: 3   atorvastatin (LIPITOR) 80 MG tablet, TAKE 1 TABLET BY MOUTH EVERY DAY, Disp: 90 tablet, Rfl: 2   docusate sodium (COLACE) 100 MG capsule, Take 1 capsule (100 mg total) by mouth 2 (two) times daily as needed for mild constipation., Disp: 20 capsule, Rfl: 0   ELIQUIS 5 MG TABS tablet, TAKE 1 TABLET BY MOUTH 2 TIMES DAILY, Disp: 180 tablet, Rfl: 3   FEROSUL 325 (65 Fe) MG tablet, TAKE 1 TABLET BY MOUTH EVERY DAY WITH BREAKFAST, Disp: 30 tablet, Rfl: 8   hydrocortisone cream 1 %, Apply 1 application topically daily as needed for itching., Disp: , Rfl:    nicotine (NICODERM CQ - DOSED IN MG/24 HOURS) 14 mg/24hr patch, Place 1 patch (14 mg total) onto the skin daily., Disp: 28 patch, Rfl: 0   nicotine (NICODERM CQ - DOSED IN MG/24 HR) 7 mg/24hr patch, Place 1 patch (7 mg total) onto the skin daily. Start After completed 28 days of 14 mg patch., Disp: 28 patch,  Rfl: 0   Tiotropium Bromide-Olodaterol (STIOLTO RESPIMAT) 2.5-2.5 MCG/ACT AERS, Inhale 2 puffs into the lungs daily., Disp: 4 g, Rfl: 0   Vitamin D, Ergocalciferol, (DRISDOL) 1.25 MG (50000 UNIT) CAPS capsule, Take 1 capsule (50,000 Units total) by mouth 2 (two) times a week., Disp: 24 capsule, Rfl: 1   carvedilol (COREG) 6.25 MG tablet, TAKE 1 TABLET BY MOUTH 2 TIMES DAILY WITH A meal, Disp: 60 tablet, Rfl: 2   mirtazapine (REMERON) 15 MG tablet, TAKE 1 TABLET BY MOUTH AT BEDTIME, Disp: 30 tablet, Rfl: 2   pantoprazole (PROTONIX) 40 MG tablet, TAKE 1 TABLET BY MOUTH EVERY DAY, Disp: 30 tablet, Rfl: 2   Allergies  Allergen Reactions   Clonidine Derivatives Other (See Comments)    Patch causes  scars   Spironolactone Rash     Review of Systems  Constitutional: Negative.  Negative for malaise/fatigue.  Respiratory: Negative.  Negative for shortness of breath.   Cardiovascular: Negative.  Negative for chest pain and palpitations.  Neurological: Negative.  Negative for headaches.  Psychiatric/Behavioral: Negative.       Today's Vitals   03/21/22 1031  BP: 110/80  Pulse: 78  Temp: 98.1 F (36.7 C)  SpO2: 98%  Weight: 116 lb 3.2 oz (52.7 kg)  Height: 5' 7" (1.702 m)  PainSc: 0-No pain   Body mass index is 18.2 kg/m.  Wt Readings from Last 3 Encounters:  03/21/22 116 lb 3.2 oz (52.7 kg)  02/15/22 116 lb (52.6 kg)  02/09/22 116 lb 12.8 oz (53 kg)    Objective:  Physical Exam Vitals reviewed.  Constitutional:      General: She is not in acute distress.    Comments: Thin frame  Cardiovascular:     Rate and Rhythm: Normal rate and regular rhythm.     Pulses: Normal pulses.     Heart sounds: Normal heart sounds. No murmur heard. Pulmonary:     Effort: Pulmonary effort is normal. No respiratory distress.     Breath sounds: Normal breath sounds. No wheezing.  Skin:    General: Skin is warm and dry.     Capillary Refill: Capillary refill takes less than 2 seconds.  Neurological:     General: No focal deficit present.     Mental Status: She is alert and oriented to person, place, and time.     Cranial Nerves: No cranial nerve deficit.     Motor: No weakness.  Psychiatric:        Mood and Affect: Mood normal.        Behavior: Behavior normal.        Thought Content: Thought content normal.        Judgment: Judgment normal.         Assessment And Plan:     1. Hypertensive heart disease without heart failure Comments: Blood pressure is well controlled, continue current medications and f/u with Cardiology - CMP14+EGFR - Lipid panel  2. Prediabetes Comments: Stable, continue focusing on healthy diet.  - Hemoglobin A1c  3. Age-related osteoporosis without  current pathological fracture Comments: Continue current medications.  4. Malnutrition of moderate degree (HCC) Comments: She has gained a few pounds since her last visit, encouraged to drink protein shakes as well at least 2 times a day  5. Hemiparesis affecting nondominant 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 ([place])  6. Atherosclerosis of coronary artery bypass graft(s), unspecified, with other forms of angina pectoris [facility]) Comments: Continue statin and f/u with Cardiology  7.  Poor dentition Comments: Will make another referral to a dentist, this can be affecting her weight - Ambulatory referral to Dentistry  8. Other long term (current) drug therapy - CBC  9. Encounter for immunization - Pneumococcal conjugate vaccine 20-valent (Prevnar 20)  10. Wheelchair dependence Comments: Will order standard wheelchair, high fall risk due to right hemiplegia, needs for her ambulation and improving quality of life     Patient was given opportunity to ask questions. Patient verbalized understanding of the plan and was able to repeat key elements of the plan. All questions were answered to their satisfaction.  [name], FNP   I, [name], FNP, have reviewed all documentation for this visit. The documentation on 03/21/22 for the exam, diagnosis, procedures, and orders are all accurate and complete.   IF YOU HAVE BEEN REFERRED TO A SPECIALIST, IT MAY TAKE 1-2 WEEKS TO SCHEDULE/PROCESS THE REFERRAL. IF YOU HAVE NOT HEARD FROM US/SPECIALIST IN TWO WEEKS, PLEASE GIVE [place] A CALL AT [phone] X [phone].   THE PATIENT IS ENCOURAGED TO PRACTICE SOCIAL DISTANCING DUE TO THE COVID-19 PANDEMIC.

## 2022-03-21 NOTE — Patient Instructions (Addendum)
Hypertension, Adult ?Hypertension is another name for high blood pressure. High blood pressure forces your heart to work harder to pump blood. This can cause problems over time. ?There are two numbers in a blood pressure reading. There is a top number (systolic) over a bottom number (diastolic). It is best to have a blood pressure that is below 120/80. ?What are the causes? ?The cause of this condition is not known. Some other conditions can lead to high blood pressure. ?What increases the risk? ?Some lifestyle factors can make you more likely to develop high blood pressure: ?Smoking. ?Not getting enough exercise or physical activity. ?Being overweight. ?Having too much fat, sugar, calories, or salt (sodium) in your diet. ?Drinking too much alcohol. ?Other risk factors include: ?Having any of these conditions: ?Heart disease. ?Diabetes. ?High cholesterol. ?Kidney disease. ?Obstructive sleep apnea. ?Having a family history of high blood pressure and high cholesterol. ?Age. The risk increases with age. ?Stress. ?What are the signs or symptoms? ?High blood pressure may not cause symptoms. Very high blood pressure (hypertensive crisis) may cause: ?Headache. ?Fast or uneven heartbeats (palpitations). ?Shortness of breath. ?Nosebleed. ?Vomiting or feeling like you may vomit (nauseous). ?Changes in how you see. ?Very bad chest pain. ?Feeling dizzy. ?Seizures. ?How is this treated? ?This condition is treated by making healthy lifestyle changes, such as: ?Eating healthy foods. ?Exercising more. ?Drinking less alcohol. ?Your doctor may prescribe medicine if lifestyle changes do not help enough and if: ?Your top number is above 130. ?Your bottom number is above 80. ?Your personal target blood pressure may vary. ?Follow these instructions at home: ?Eating and drinking ? ?If told, follow the DASH eating plan. To follow this plan: ?Fill one half of your plate at each meal with fruits and vegetables. ?Fill one fourth of your plate  at each meal with whole grains. Whole grains include whole-wheat pasta, brown rice, and whole-grain bread. ?Eat or drink low-fat dairy products, such as skim milk or low-fat yogurt. ?Fill one fourth of your plate at each meal with low-fat (lean) proteins. Low-fat proteins include fish, chicken without skin, eggs, beans, and tofu. ?Avoid fatty meat, cured and processed meat, or chicken with skin. ?Avoid pre-made or processed food. ?Limit the amount of salt in your diet to less than 1,500 mg each day. ?Do not drink alcohol if: ?Your doctor tells you not to drink. ?You are pregnant, may be pregnant, or are planning to become pregnant. ?If you drink alcohol: ?Limit how much you have to: ?0-1 drink a day for women. ?0-2 drinks a day for men. ?Know how much alcohol is in your drink. In the [place] one drink equals one 12 oz bottle of beer (355 mL), one 5 oz glass of wine (148 mL), or one 1? oz glass of hard liquor (44 mL). ?Lifestyle ? ?Work with your doctor to stay at a healthy weight or to lose weight. Ask your doctor what the best weight is for you. ?Get at least 30 minutes of exercise that causes your heart to beat faster (aerobic exercise) most days of the week. This may include walking, swimming, or biking. ?Get at least 30 minutes of exercise that strengthens your muscles (resistance exercise) at least 3 days a week. This may include lifting weights or doing Pilates. ?Do not smoke or use any products that contain nicotine or tobacco. If you need help quitting, ask your doctor. ?Check your blood pressure at home as told by your doctor. ?Keep all follow-up visits. ?Medicines ?Take over-the-counter and prescription medicines   only as told by your doctor. Follow directions carefully. ?Do not skip doses of blood pressure medicine. The medicine does not work as well if you skip doses. Skipping doses also puts you at risk for problems. ?Ask your doctor about side effects or reactions to medicines that you should watch  for. ?Contact a doctor if: ?You think you are having a reaction to the medicine you are taking. ?You have headaches that keep coming back. ?You feel dizzy. ?You have swelling in your ankles. ?You have trouble with your vision. ?Get help right away if: ?You get a very bad headache. ?You start to feel mixed up (confused). ?You feel weak or numb. ?You feel faint. ?You have very bad pain in your: ?Chest. ?Belly (abdomen). ?You vomit more than once. ?You have trouble breathing. ?These symptoms may be an emergency. Get help right away. Call 911. ?Do not wait to see if the symptoms will go away. ?Do not drive yourself to the hospital. ?Summary ?Hypertension is another name for high blood pressure. ?High blood pressure forces your heart to work harder to pump blood. ?For most people, a normal blood pressure is less than 120/80. ?Making healthy choices can help lower blood pressure. If your blood pressure does not get lower with healthy choices, you may need to take medicine. ?This information is not intended to replace advice given to you by your health care provider. Make sure you discuss any questions you have with your health care provider. ?Document Revised: 05/19/2021 Document Reviewed: 05/19/2021 ?Elsevier Patient Education ? 2023 [employer] Inc[employer] ? ?

## 2022-03-22 ENCOUNTER — Other Ambulatory Visit: Payer: Self-pay | Admitting: Nurse Practitioner

## 2022-03-22 DIAGNOSIS — R630 Anorexia: Secondary | ICD-10-CM

## 2022-03-24 ENCOUNTER — Ambulatory Visit: Payer: Self-pay

## 2022-03-24 NOTE — Patient Outreach (Signed)
  Care Coordination   Follow Up Visit Note   03/24/2022 Name: Kimberly [name] MRN: [id number] DOB: [birth year]  Kimberly [name] is a 66 y.o. year old female who sees Kimberly Brine, FNP for primary care. I spoke with sister Kimberly [name] by phone today  What matters to the patients health and wellness today?  Sister Kimberly [name] would like to continue to help Kimberly [name] improve her diabetes.     Goals Addressed      To keep working on improving her Diabetes       Care Coordination Interventions: Provided education to patient about basic DM disease process Review of patient status, including review of consultants reports, relevant laboratory and other test results, and medications completed Assessed social determinant of health barriers Confirmed sister Kimberly [name] received and reviewed the DM educational materials mailed following last RN CC contact, she denies having questions Instructed sister Kimberly [name] to switch patient's Ensure to [employer], provided rationale Mailed letter with coupons for Glucerna Advised sister Kimberly [name] of dual coverage [employer] U card and food stamps may be used to purchase Glucerna  Lab Results  Component Value Date   HGBA1C 6.2 (H) 03/21/2022       SDOH assessments and interventions completed:  Yes    Care Coordination Interventions Activated:  Yes  Care Coordination Interventions:  Yes, provided   Follow up plan: Follow up call scheduled for 06/23/22 '@09'$ :00 AM    Encounter Outcome:  Pt. Visit Completed

## 2022-03-24 NOTE — Patient Instructions (Signed)
Visit Information  Thank you for taking time to visit with me today. Please don't hesitate to contact me if I can be of assistance to you.   Following are the goals we discussed today:   Goals Addressed      To keep working on improving her Diabetes       Care Coordination Interventions: Provided education to patient about basic DM disease process Review of patient status, including review of consultants reports, relevant laboratory and other test results, and medications completed Assessed social determinant of health barriers Confirmed sister [name] received and reviewed the DM educational materials mailed following last RN CC contact, she denies having questions Instructed sister [name] to switch patient's Ensure to [employer], provided rationale Mailed letter with coupons for Glucerna Advised sister [name] of dual coverage U[employer] U card and food stamps may be used to purchase Glucerna  Lab Results  Component Value Date   HGBA1C 6.2 (H) 03/21/2022       Our next appointment is by telephone on 06/23/22 at 09:00 AM   Please call the care guide team at [phone] if you need to cancel or reschedule your appointment.   If you are experiencing a Mental Health or [place] or need someone to talk to, please call 1-800-273-TALK (toll free, 24 hour hotline)  Patient verbalizes understanding of instructions and care plan provided today and agrees to view in [place]. Active MyChart status and patient understanding of how to access instructions and care plan via MyChart confirmed with patient.     Barb Merino, RN, BSN, CCM Care Management Coordinator [facility] Care Management Direct Phone: [phone]

## 2022-03-24 NOTE — Progress Notes (Signed)
This encounter was created in error - please disregard.

## 2022-04-05 NOTE — Addendum Note (Signed)
Addended by: Roxine Caddy on: 04/05/2022 04:11 PM   Modules accepted: Orders

## 2022-04-18 ENCOUNTER — Ambulatory Visit: Payer: 59

## 2022-04-24 ENCOUNTER — Ambulatory Visit: Payer: 59

## 2022-05-05 ENCOUNTER — Telehealth: Payer: Self-pay

## 2022-05-05 NOTE — Chronic Care Management (AMB) (Signed)
  Heloise Beecham Salser sister was reminded to have all medications, supplements and any blood glucose and blood pressure readings available for review with [name], Pharm. D, at her telephone visit on 05-09-2022 at 9:00.   Questions: Have you had any recent office visit or specialist visit outside of [place]? Patient's sister stated no  Are there any concerns you would like to discuss during your office visit? Patient's sister stated no  Are you having any problems obtaining your medications? (Whether it pharmacy issues or cost) Patient's sister stated no  If patient has any PAP medications ask if they are having any problems getting their PAP medication or refill? No PAP medications  Care Gaps: Tdap overdue Covid booster overdue 2nd shingrix overdue Flu vaccine overdue  Star Rating Drug: Atorvastatin 80 mg- Last filled 04-14-2022 30 [facility]   Any gaps in medications fill history? No   [place] Pharmacist Assistant [phone]

## 2022-05-09 ENCOUNTER — Telehealth: Payer: Medicare Other

## 2022-05-09 ENCOUNTER — Telehealth: Payer: Self-pay

## 2022-05-09 NOTE — Telephone Encounter (Signed)
  Care Management   Follow Up Note   05/09/2022 Name: Kimberly [name] MRN: [id number] DOB: Apr 01, 1956   Referred by: [name], FNP Reason for referral : No chief complaint on file.   Successful contact was made with [name] however she does not have her sisters medication list. She would like to reschedule the appointment for a different day. Rescheduled appointment for October.  Follow Up Plan: Telephone follow up appointment with care management team member scheduled for:  06/06/2022  [name], CPP, PharmD Clinical Pharmacist Practitioner [facility] [phone]

## 2022-05-19 ENCOUNTER — Ambulatory Visit (INDEPENDENT_AMBULATORY_CARE_PROVIDER_SITE_OTHER): Payer: 59 | Admitting: Podiatry

## 2022-05-19 ENCOUNTER — Encounter: Payer: Self-pay | Admitting: Podiatry

## 2022-05-19 DIAGNOSIS — B351 Tinea unguium: Secondary | ICD-10-CM

## 2022-05-19 DIAGNOSIS — M79675 Pain in left toe(s): Secondary | ICD-10-CM

## 2022-05-19 DIAGNOSIS — M79674 Pain in right toe(s): Secondary | ICD-10-CM

## 2022-05-24 NOTE — Progress Notes (Signed)
  Subjective:  Patient ID: Kimberly [name], female    DOB: 1955/12/12,  MRN: [id number]  Kimberly [name] presents to clinic today for:  Chief Complaint  Patient presents with   Nail Problem    Routine foot care PCP-[name] Moore PCP VST-Summer 2023    New problem(s): None.   Patient is accompanied by her sister on today's visit.  PCP is [name], FNP , and last visit was  March 21, 2022.  Allergies  Allergen Reactions   Clonidine Derivatives Other (See Comments)    Patch causes scars   Spironolactone Rash    Review of Systems: Negative except as noted in the HPI.  Objective: No changes noted in today's physical examination.  Kimberly [name] is a pleasant 66 y.o. female thin build in NAD. AAO x 3.  Vascular Examination: Vascular status intact b/l with palpable pedal pulses. Pedal hair present b/l. CFT immediate b/l. No edema. No pain with calf compression b/l. Skin temperature gradient WNL b/l.   Neurological Examination: Sensation grossly intact b/l with 10 gram monofilament. Vibratory sensation intact b/l.   Dermatological Examination: Pedal skin with normal turgor, texture and tone b/l. Toenails 1-5 b/l thick, discolored, elongated with subungual debris and pain on dorsal palpation. No hyperkeratotic lesions noted b/l.   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 Hammertoe deformity noted 2-5 b/l. Pes planus deformity noted bilateral LE.  Radiographs: None Assessment/Plan: 1. Pain due to onychomycosis of toenails of both feet     No orders of the defined types were placed in this encounter.   -Patient's family member present. All questions/concerns addressed on today's visit. -Examined patient. -Patient to continue soft, supportive shoe gear daily. -Toenails 1-5 b/l were debrided in length and girth with sterile nail nippers and dremel without iatrogenic bleeding.  -Patient/POA to call should there be question/concern in the  interim.   Return in about 3 months (around 08/19/2022).  Marzetta Board, DPM

## 2022-06-05 ENCOUNTER — Telehealth: Payer: Self-pay

## 2022-06-05 NOTE — Chronic Care Management (AMB) (Signed)
    Called [facility], No answer, left message with sister of appointment on 06-06-2022 at 9:00 via telephone visit with [name], Pharm D. Notified to have all medications, supplements, blood pressure and/or blood sugar logs available during appointment and to return call if need to reschedule.   Care Gaps: Tdap overdue Covid booster overdue 2nd shingrix overdue Flu vaccine overdue  Star Rating Drug: Atorvastatin 80 mg- Last filled 05-31-2022 [facility]  Any gaps in medications fill history? No  [place] Pharmacist Assistant [phone]

## 2022-06-06 ENCOUNTER — Telehealth: Payer: Medicare Other

## 2022-06-06 ENCOUNTER — Telehealth: Payer: Self-pay

## 2022-06-06 NOTE — Progress Notes (Deleted)
Current Barriers:  Unable to independently afford treatment regimen Unable to independently monitor therapeutic efficacy  Pharmacist Clinical Goal(s):  Patient will achieve adherence to monitoring guidelines and medication adherence to achieve therapeutic efficacy through collaboration with PharmD and provider.   Interventions: 1:1 collaboration with [name], FNP regarding development and update of comprehensive plan of care as evidenced by provider attestation and co-signature Inter-disciplinary care team collaboration (see longitudinal plan of care) Comprehensive medication review performed; medication list updated in electronic medical record  Hypertension (BP goal <130/80) -{US controlled/uncontrolled:25276} -Current treatment: *** -Medications previously tried: ***  -Current home readings: *** -Current dietary habits: *** -Current exercise habits: *** -{ACTIONS;DENIES/REPORTS:21021675::"Denies"} hypotensive/hypertensive symptoms -Educated on {CCM BP Counseling:25124} -Counseled to monitor BP at home ***, document, and provide log at future appointments -{CCMPHARMDINTERVENTION:25122}  Hyperlipidemia: (LDL goal < 55) -Uncontrolled -Current treatment: Atorvastatin 80 mg tablet once per day  -Medications previously tried: ***  -Current dietary patterns: *** -Current exercise habits: *** -Educated on {CCM HLD Counseling:25126} -{CCMPHARMDINTERVENTION:25122}   Patient Goals/Self-Care Activities Patient will:  - {pharmacypatientgoals:24919}  Follow Up Plan: {CM FOLLOW UP GBTD:17616}   Chronic Care Management Pharmacy Note  06/06/2022 Name:  Kimberly [name] MRN:  [id number] DOB:  07-17-1956  Summary: ***  Recommendations/Changes made from today's visit: ***  Plan: ***   Subjective: Kimberly [name] is an 66 y.o. year old female who is a primary patient of [name], [place].  The CCM team was consulted for assistance with disease management and care  coordination needs.    {CCMTELEPHONEFACETOFACE:21091510} for {CCMINITIALFOLLOWUPCHOICE:21091511} in response to provider referral for pharmacy case management and/or care coordination services.   Consent to Services:  {CCMCONSENTOPTIONS:25074}  Patient Care Team: [name], FNP as PCP - General (General Practice) [name], MD as PCP - Cardiology (Cardiology) Verl Blalock, Marijo Conception, MD (Inactive) (Cardiology) Lynne Logan, RN as Case Manager Pieter Partridge, DO as Consulting Physician (Neurology) Mayford Knife, [facility] (Pharmacist)  Recent office visits: ***  Recent consult visits: [facility] visits: {Hospital DC Yes/No:25215}   Objective:  Lab Results  Component Value Date   CREATININE 1.10 (H) 03/21/2022   BUN 15 03/21/2022   EGFR 56 (L) 03/21/2022   GFRNONAA 57 (L) 12/21/2020   GFRAA >60 04/27/2020   NA 142 03/21/2022   K 3.8 03/21/2022   CALCIUM 9.4 03/21/2022   CO2 17 (L) 03/21/2022   GLUCOSE 130 (H) 03/21/2022    Lab Results  Component Value Date/Time   HGBA1C 6.2 (H) 03/21/2022 11:20 AM   HGBA1C 6.0 (H) 08/10/2021 12:08 PM    Last diabetic Eye exam: No results found for: "HMDIABEYEEXA"  Last diabetic Foot exam: No results found for: "HMDIABFOOTEX"   Lab Results  Component Value Date   CHOL 111 03/21/2022   HDL 47 03/21/2022   LDLCALC 47 03/21/2022   TRIG 86 03/21/2022   CHOLHDL 2.4 03/21/2022       Latest Ref Rng & Units 03/21/2022   11:20 AM 08/10/2021   12:08 PM 01/04/2021    4:38 PM  Hepatic Function  Total Protein 6.0 - 8.5 g/dL 6.7  6.9  7.0   Albumin 3.9 - 4.9 g/dL 4.4  4.7  4.5   AST 0 - 40 IU/L _0 ALT 0 - 32 IU/L 30  19  63   Alk Phosphatase 44 - 121 IU/L 98  123  131   Total Bilirubin 0.0 - 1.2 mg/dL 0.6  0.6  0.4  Lab Results  Component Value Date/Time   TSH 0.897 08/19/2020 09:11 AM   TSH 1.550 12/02/2019 02:23 PM   TSH 1.245 02/13/2018 11:25 AM   TSH 1.466 11/24/2008 09:25 PM       Latest Ref Rng &  Units 03/21/2022   11:20 AM 08/10/2021   12:08 PM 01/04/2021    4:38 PM  CBC  WBC 3.4 - 10.8 x10E3/uL 5.4  5.3  7.8   Hemoglobin 11.1 - 15.9 g/dL 12.5  11.8  12.6   Hematocrit 34.0 - 46.6 % 42.2  39.7  40.9   Platelets 150 - 450 x10E3/uL 247  317  307     Lab Results  Component Value Date/Time   VD25OH 15.6 (L) 08/10/2021 12:08 PM    Clinical ASCVD: {YES/NO:21197} The ASCVD Risk score (Arnett DK, et al., 2019) failed to calculate for the following reasons:   The patient has a prior MI or stroke diagnosis       08/10/2021   10:39 AM 03/30/2020   10:59 AM 12/02/2019   12:17 PM  Depression screen PHQ 2/9  Decreased Interest 1 1 0  Down, Depressed, Hopeless _0 PHQ - 2 Score _1 Altered sleeping 3 3 0  Tired, decreased energy _2 Change in appetite _3 Feeling bad or failure about yourself  0 1 1  Trouble concentrating _4 Moving slowly or fidgety/restless _5 Suicidal thoughts 0 0 0  PHQ-9 Score _6 Difficult doing work/chores Somewhat difficult Somewhat difficult Somewhat difficult     ***Other: (CHADS2VASc if Afib, MMRC or CAT for COPD, ACT, DEXA)  Social History   Tobacco Use  Smoking Status Former   Packs/day: 2.00   Years: 35.00   Total pack years: 70.00   Types: Cigarettes   Quit date: 08/12/2019   Years since quitting: 2.8  Smokeless Tobacco Former   Quit date: 08/09/2008  Tobacco Comments   Stopped smoking in May 2022 [facility]    BP Readings from Last 3 Encounters:  03/21/22 110/80  02/15/22 (!) 124/94  02/09/22 98/60   Pulse Readings from Last 3 Encounters:  03/21/22 78  02/15/22 76  02/09/22 65   Wt Readings from Last 3 Encounters:  03/21/22 116 lb 3.2 oz (52.7 kg)  02/15/22 116 lb (52.6 kg)  02/09/22 116 lb 12.8 oz (53 kg)   BMI Readings from Last 3 Encounters:  03/21/22 18.20 kg/m  02/15/22 18.17 kg/m  02/09/22 18.29 kg/m    Assessment/Interventions: Review of patient past medical history, allergies, medications,  health status, including review of consultants reports, laboratory and other test data, was performed as part of comprehensive evaluation and provision of chronic care management services.   SDOH:  (Social Determinants of Health) assessments and interventions performed: {yes/no:20286} SDOH Interventions    Flowsheet Row Office Visit from 08/10/2021 in [place] Visit from 03/30/2020 in [place] Visit from 12/02/2019 in [facility]  SDOH Interventions     Depression Interventions/Treatment  Currently on Treatment Referral to Psychiatry  [appt was cancelled] Counseling      SDOH Screenings   Food Insecurity: Food Insecurity Present (12/06/2018)  Housing: Medium Risk (12/06/2018)  Transportation Needs: Unmet Transportation Needs (12/06/2018)  Depression (PHQ2-9): High Risk (08/10/2021)  Financial Resource Strain: High Risk (12/06/2018)  Physical Activity: Inactive (12/06/2018)  Social Connections: Unknown (12/06/2018)  Stress: No Stress Concern Present (  12/06/2018)  Tobacco Use: Medium Risk (05/19/2022)    CCM Care Plan  Allergies  Allergen Reactions   Clonidine Derivatives Other (See Comments)    Patch causes scars   Spironolactone Rash    Medications Reviewed Today     Reviewed by Marzetta Board, DPM (Physician) on 05/19/22 at 1004  Med List Status: <None>   Medication Order Taking? Sig Documenting Provider Last Dose Status Informant  albuterol (VENTOLIN HFA) 108 (90 Base) MCG/ACT inhaler [id number] No Inhale 2 puffs into the lungs every 6 (six) hours as needed for wheezing or shortness of breath. Collene Gobble, MD Taking Active   alendronate (FOSAMAX) 70 MG tablet [id number] No TAKE 1 TABLET BY MOUTH EVERY 7 DAYS. Take WITH A full GLASS OF WATER ON EMPTY stomach [name], FNP Taking Active   amLODipine (NORVASC) 5 MG tablet [id number] No Take 1 tablet (5 mg total) by mouth daily. [name], MD Taking Active   atorvastatin (LIPITOR) 80 MG tablet [id number] No TAKE 1 TABLET BY MOUTH EVERY DAY [name], FNP Taking Active   carvedilol (COREG) 6.25 MG tablet [id number]  TAKE 1 TABLET BY MOUTH 2 TIMES DAILY WITH A meal [name], FNP  Active   docusate sodium (COLACE) 100 MG capsule [id number] No Take 1 capsule (100 mg total) by mouth 2 (two) times daily as needed for mild constipation. Thurnell Lose, MD Taking Active   ELIQUIS 5 MG TABS tablet [id number] No TAKE 1 TABLET BY MOUTH 2 TIMES DAILY [name], FNP Taking Active   FEROSUL 325 (65 Fe) MG tablet [id number] No TAKE 1 TABLET BY MOUTH EVERY DAY WITH Nettie Elm, MD Taking Active   hydrocortisone cream 1 % [id number] No Apply 1 application topically daily as needed for itching. [provider] Taking Active   mirtazapine (REMERON) 15 MG tablet [id number]  TAKE 1 TABLET BY MOUTH AT BEDTIME [name], FNP  Active   nicotine (NICODERM CQ - DOSED IN MG/24 HOURS) 14 mg/24hr patch [id number] No Place 1 patch (14 mg total) onto the skin daily. Warren Lacy, PA-C Taking Active   nicotine (NICODERM CQ - DOSED IN MG/24 HR) 7 mg/24hr patch [id number] No Place 1 patch (7 mg total) onto the skin daily. Start After completed 28 days of 14 mg patch. Warren Lacy, PA-C Taking Active   pantoprazole (PROTONIX) 40 MG tablet [id number]  TAKE 1 TABLET BY MOUTH EVERY DAY [name], FNP  Active   Tiotropium Bromide-Olodaterol (STIOLTO RESPIMAT) 2.5-2.5 MCG/ACT AERS [id number] No Inhale 2 puffs into the lungs daily. Collene Gobble, MD Taking Active   Vitamin D, Ergocalciferol, (DRISDOL) 1.25 MG (50000 UNIT) CAPS capsule [id number] No Take 1 capsule (50,000 Units total) by mouth 2 (two) times a week. [name], FNP Taking Active   Med List Note Elyn Peers, CPhT 12/18/20 2051): Obtained med hx from sister             Patient Active Problem List   Diagnosis Date Noted   COPD (chronic  obstructive pulmonary disease) ([place]) 02/09/2022   Osteoporosis 02/07/2022   Pulmonary nodule 01/25/2021   Shortness of breath 01/25/2021   Sepsis ([place]) 12/18/2020   Family history of malignant neoplasm of gastrointestinal tract 12/07/2020   Personal history of colonic polyps 12/07/2020   Secondary hypercoagulable state ([place]) 10/14/2020   Right sided abdominal pain    Abdominal wall pain    Atrial fibrillation ([place]) 08/19/2020   Educated about COVID-19 virus infection 11/16/2019  Urinary frequency 04/14/2019   Cough 04/14/2019   AKI (acute kidney injury) ([place]) 11/26/2018   Diarrhea 11/25/2018   Dehydration, mild 11/25/2018   Hemiparesis affecting nondominant 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 ([place]) 07/31/2018   Muscle ache 07/31/2018   Prediabetes 07/31/2018   Chronic gout without tophus 07/31/2018   Vision loss 07/31/2018   Malnutrition of moderate degree 02/15/2018   Carotid occlusion, bilateral    Atrial fibrillation with RVR ([place]) 02/13/2018   Positive D dimer 02/13/2018   Back pain 02/13/2018   CVA (cerebral vascular accident) ([place]) 02/13/2018   Paroxysmal atrial fibrillation ([place]) 02/13/2018   Coronary artery disease involving native coronary artery of native heart without angina pectoris 02/13/2018   Apical variant hypertrophic cardiomyopathy ([place]) 02/13/2018   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[place]) 02/13/2018   Aortic atherosclerosis ([place]) 02/13/2018   TOBACCO USE, QUIT 05/03/2009   Atherosclerosis of coronary artery bypass graft(s), unspecified, with other forms of angina pectoris ([place])    ROTATOR CUFF SYNDROME, LEFT 02/12/2009   Shoulder pain, left 11/03/2008   HAMMER TOE, ACQUIRED 02/11/2008   INSECT BITE, LEG 02/11/2008   Cerebral artery occlusion with cerebral infarction ([place]) 12/05/2007   Dyslipidemia 11/05/2007   UNSPECIFIED DISORDER TEETH&SUPPORTING STRUCTURES 11/05/2007   BICEPS TENDINITIS, RIGHT 04/24/2007   Essential hypertension 04/23/2007    MICROCYTOSIS 04/23/2007    Immunization History  Administered Date(s) Administered   Influenza Split 06/24/2016   Influenza,inj,Quad PF,6+ Mos 07/29/2013, 06/17/2017, 05/17/2020   Influenza-Unspecified 07/18/2021   Moderna SARS-COV2 Booster Vaccination 09/30/2020   Moderna Sars-Covid-2 Vaccination 01/23/2020, 02/20/2020   PNEUMOCOCCAL CONJUGATE-20 03/21/2022   Zoster Recombinat (Shingrix) 03/16/2021    Conditions to be addressed/monitored:  {USCCMDZASSESSMENTOPTIONS:23563}  There are no care plans that you recently modified to display for this patient.    Medication Assistance: {MEDASSISTANCEINFO:25044}  Compliance/Adherence/Medication fill history: Care Gaps: ***  Star-Rating Drugs: ***  Patient's preferred pharmacy is:  [facility] [place] [address] Phone: [phone] Fax: [place] [address] [place] [address] Phone: [phone] Fax: [phone]  [place], [place] - [address] [place] [address] Phone: [phone] Fax: [phone]  [facility] - [place], [place] - [address] [place] [address] [place] [address] Phone: [phone] Fax: [phone]  [id number] - [place], [place] [address] [place] [address] Phone: [phone] Fax: [phone]  Uses pill box? {Yes or If no, why not?:20788} Pt endorses ***% compliance  We discussed: {Pharmacy options:24294} Patient decided to: {US Pharmacy Plan:23885}  Care Plan and Follow Up Patient Decision:  {FOLLOWUP:24991}  Plan: {CM FOLLOW UP PLAN:25073}  ***

## 2022-06-06 NOTE — Telephone Encounter (Signed)
  Care Management   Follow Up Note   06/06/2022 Name: LORILEI HORAN MRN: [id number] DOB: 11/04/1955   Referred by: [name], FNP Reason for referral : No chief complaint on file.   An unsuccessful telephone outreach was attempted today. The patient was referred to the case management team for assistance with care management and care coordination.   Follow Up Plan: We have been unable to make contact with the patient for follow up. The care management team is available to follow up with the patient after provider conversation with the patient regarding recommendation for care management engagement and subsequent re-referral to the care management team.   [name], CPP, PharmD Clinical Pharmacist Practitioner [facility] [phone]

## 2022-06-13 ENCOUNTER — Other Ambulatory Visit: Payer: Self-pay | Admitting: Nurse Practitioner

## 2022-06-13 DIAGNOSIS — R630 Anorexia: Secondary | ICD-10-CM

## 2022-06-23 ENCOUNTER — Emergency Department (HOSPITAL_COMMUNITY): Payer: 59

## 2022-06-23 ENCOUNTER — Encounter (HOSPITAL_COMMUNITY): Payer: Self-pay | Admitting: *Deleted

## 2022-06-23 ENCOUNTER — Observation Stay (HOSPITAL_COMMUNITY)
Admission: EM | Admit: 2022-06-23 | Discharge: 2022-06-27 | Disposition: A | Discharge status: Home Health | Payer: 59 | Attending: Emergency Medicine | Admitting: Emergency Medicine

## 2022-06-23 ENCOUNTER — Observation Stay (HOSPITAL_BASED_OUTPATIENT_CLINIC_OR_DEPARTMENT_OTHER): Payer: 59

## 2022-06-23 ENCOUNTER — Other Ambulatory Visit: Payer: Self-pay

## 2022-06-23 ENCOUNTER — Ambulatory Visit: Payer: Self-pay

## 2022-06-23 DIAGNOSIS — I25708 Atherosclerosis of coronary artery bypass graft(s), unspecified, with other forms of angina pectoris: Secondary | ICD-10-CM | POA: Diagnosis not present

## 2022-06-23 DIAGNOSIS — I2510 Atherosclerotic heart disease of native coronary artery without angina pectoris: Secondary | ICD-10-CM | POA: Diagnosis present

## 2022-06-23 DIAGNOSIS — I10 Essential (primary) hypertension: Secondary | ICD-10-CM | POA: Diagnosis not present

## 2022-06-23 DIAGNOSIS — Z79899 Other long term (current) drug therapy: Secondary | ICD-10-CM | POA: Insufficient documentation

## 2022-06-23 DIAGNOSIS — Z951 Presence of aortocoronary bypass graft: Secondary | ICD-10-CM | POA: Insufficient documentation

## 2022-06-23 DIAGNOSIS — I422 Other hypertrophic cardiomyopathy: Secondary | ICD-10-CM

## 2022-06-23 DIAGNOSIS — Z1152 Encounter for screening for COVID-19: Secondary | ICD-10-CM | POA: Diagnosis not present

## 2022-06-23 DIAGNOSIS — I4891 Unspecified atrial fibrillation: Secondary | ICD-10-CM

## 2022-06-23 DIAGNOSIS — N179 Acute kidney failure, unspecified: Secondary | ICD-10-CM | POA: Diagnosis present

## 2022-06-23 DIAGNOSIS — I69359 Hemiplegia and hemiparesis following cerebral infarction affecting unspecified side: Secondary | ICD-10-CM

## 2022-06-23 DIAGNOSIS — J069 Acute upper respiratory infection, unspecified: Secondary | ICD-10-CM | POA: Insufficient documentation

## 2022-06-23 DIAGNOSIS — R42 Dizziness and giddiness: Secondary | ICD-10-CM

## 2022-06-23 DIAGNOSIS — M1A9XX0 Chronic gout, unspecified, without tophus (tophi): Secondary | ICD-10-CM | POA: Diagnosis present

## 2022-06-23 DIAGNOSIS — I25118 Atherosclerotic heart disease of native coronary artery with other forms of angina pectoris: Secondary | ICD-10-CM | POA: Insufficient documentation

## 2022-06-23 DIAGNOSIS — Z87891 Personal history of nicotine dependence: Secondary | ICD-10-CM | POA: Insufficient documentation

## 2022-06-23 DIAGNOSIS — Z7901 Long term (current) use of anticoagulants: Secondary | ICD-10-CM | POA: Insufficient documentation

## 2022-06-23 DIAGNOSIS — J449 Chronic obstructive pulmonary disease, unspecified: Secondary | ICD-10-CM | POA: Diagnosis not present

## 2022-06-23 DIAGNOSIS — E785 Hyperlipidemia, unspecified: Secondary | ICD-10-CM | POA: Diagnosis present

## 2022-06-23 DIAGNOSIS — I480 Paroxysmal atrial fibrillation: Secondary | ICD-10-CM | POA: Diagnosis not present

## 2022-06-23 DIAGNOSIS — R55 Syncope and collapse: Secondary | ICD-10-CM

## 2022-06-23 DIAGNOSIS — Z8673 Personal history of transient ischemic attack (TIA), and cerebral infarction without residual deficits: Secondary | ICD-10-CM | POA: Diagnosis not present

## 2022-06-23 LAB — COMPREHENSIVE METABOLIC PANEL
ALT: 28 U/L (ref 0–44)
AST: 24 U/L (ref 15–41)
Albumin: 4 g/dL (ref 3.5–5.0)
Alkaline Phosphatase: 70 U/L (ref 38–126)
Anion gap: 14 (ref 5–15)
BUN: 17 mg/dL (ref 8–23)
CO2: 21 mmol/L — ABNORMAL LOW (ref 22–32)
Calcium: 9.6 mg/dL (ref 8.9–10.3)
Chloride: 110 mmol/L (ref 98–111)
Creatinine, Ser: 1.23 mg/dL — ABNORMAL HIGH (ref 0.44–1.00)
GFR, Estimated: 49 mL/min — ABNORMAL LOW (ref >60)
Glucose, Bld: 81 mg/dL (ref 70–99)
Potassium: 3.8 mmol/L (ref 3.5–5.1)
Sodium: 145 mmol/L (ref 135–145)
Total Bilirubin: 0.7 mg/dL (ref 0.3–1.2)
Total Protein: 6.3 g/dL — ABNORMAL LOW (ref 6.5–8.1)

## 2022-06-23 LAB — CBC WITH DIFFERENTIAL/PLATELET
Abs Immature Granulocytes: 0.02 10*3/uL (ref 0.00–0.07)
Basophils Absolute: 0.1 10*3/uL (ref 0.0–0.1)
Basophils Relative: 1 %
Eosinophils Absolute: 0.1 10*3/uL (ref 0.0–0.5)
Eosinophils Relative: 1 %
HCT: 39.5 % (ref 36.0–46.0)
Hemoglobin: 12.7 g/dL (ref 12.0–15.0)
Immature Granulocytes: 0 %
Lymphocytes Relative: 22 %
Lymphs Abs: 1.6 10*3/uL (ref 0.7–4.0)
MCH: 22.9 pg — ABNORMAL LOW (ref 26.0–34.0)
MCHC: 32.2 g/dL (ref 30.0–36.0)
MCV: 71.2 fL — ABNORMAL LOW (ref 80.0–100.0)
Monocytes Absolute: 0.3 10*3/uL (ref 0.1–1.0)
Monocytes Relative: 5 %
Neutro Abs: 5.3 10*3/uL (ref 1.7–7.7)
Neutrophils Relative %: 71 %
Platelets: 207 10*3/uL (ref 150–400)
RBC: 5.55 MIL/uL — ABNORMAL HIGH (ref 3.87–5.11)
RDW: 17.5 % — ABNORMAL HIGH (ref 11.5–15.5)
WBC: 7.4 10*3/uL (ref 4.0–10.5)
nRBC: 0 % (ref 0.0–0.2)

## 2022-06-23 LAB — ECHOCARDIOGRAM COMPLETE
Height: 67 in
S' Lateral: 3.1 cm
Weight: 1904 oz

## 2022-06-23 LAB — TROPONIN I (HIGH SENSITIVITY)
Troponin I (High Sensitivity): 15 ng/L (ref <18)
Troponin I (High Sensitivity): 15 ng/L (ref <18)

## 2022-06-23 LAB — LIPASE, BLOOD: Lipase: 26 U/L (ref 11–51)

## 2022-06-23 MED ORDER — ALBUTEROL SULFATE (2.5 MG/3ML) 0.083% IN NEBU: 2.5000 mg | INHALATION_SOLUTION | Freq: Four times a day (QID) | RESPIRATORY_TRACT | Status: DC | PRN

## 2022-06-23 MED ORDER — SODIUM CHLORIDE 0.9 % IV BOLUS
1000.0000 mL | Freq: Once | INTRAVENOUS | Status: AC
  Administered 2022-06-23: 1000 mL via INTRAVENOUS

## 2022-06-23 MED ORDER — SODIUM CHLORIDE 0.9 % IV SOLN: INTRAVENOUS | Status: DC

## 2022-06-23 MED ORDER — DILTIAZEM HCL-DEXTROSE 125-5 MG/125ML-% IV SOLN (PREMIX)
5.0000 mg/h | INTRAVENOUS | Status: DC
  Administered 2022-06-23: 5 mg/h via INTRAVENOUS
  Filled 2022-06-23: qty 125

## 2022-06-23 MED ORDER — CARVEDILOL 3.125 MG PO TABS
6.2500 mg | ORAL_TABLET | Freq: Two times a day (BID) | ORAL | Status: DC
  Administered 2022-06-23: 6.25 mg via ORAL
  Filled 2022-06-23: qty 2

## 2022-06-23 MED ORDER — APIXABAN 5 MG PO TABS
5.0000 mg | ORAL_TABLET | Freq: Two times a day (BID) | ORAL | Status: DC
  Administered 2022-06-24 – 2022-06-27 (×8): 5 mg via ORAL
  Filled 2022-06-23 (×8): qty 1

## 2022-06-23 MED ORDER — PANTOPRAZOLE SODIUM 40 MG PO TBEC
40.0000 mg | DELAYED_RELEASE_TABLET | Freq: Every day | ORAL | Status: DC
  Administered 2022-06-24 – 2022-06-27 (×4): 40 mg via ORAL
  Filled 2022-06-23 (×4): qty 1

## 2022-06-23 MED ORDER — AMLODIPINE BESYLATE 5 MG PO TABS
5.0000 mg | ORAL_TABLET | Freq: Every day | ORAL | Status: DC
  Administered 2022-06-24 – 2022-06-27 (×4): 5 mg via ORAL
  Filled 2022-06-23 (×4): qty 1

## 2022-06-23 MED ORDER — ATORVASTATIN CALCIUM 80 MG PO TABS
80.0000 mg | ORAL_TABLET | Freq: Every day | ORAL | Status: DC
  Administered 2022-06-24 – 2022-06-27 (×4): 80 mg via ORAL
  Filled 2022-06-23: qty 1
  Filled 2022-06-23: qty 2
  Filled 2022-06-23 (×2): qty 1

## 2022-06-23 MED ORDER — DILTIAZEM LOAD VIA INFUSION
10.0000 mg | Freq: Once | INTRAVENOUS | Status: AC
  Administered 2022-06-23: 10 mg via INTRAVENOUS
  Filled 2022-06-23: qty 10

## 2022-06-23 MED ORDER — CARVEDILOL 3.125 MG PO TABS: 6.2500 mg | ORAL_TABLET | Freq: Two times a day (BID) | ORAL | Status: DC

## 2022-06-23 MED ORDER — UMECLIDINIUM BROMIDE 62.5 MCG/ACT IN AEPB
1.0000 | INHALATION_SPRAY | Freq: Every day | RESPIRATORY_TRACT | Status: DC
  Administered 2022-06-25 – 2022-06-27 (×3): 1 via RESPIRATORY_TRACT
  Filled 2022-06-23 (×2): qty 7

## 2022-06-23 MED ORDER — POLYETHYLENE GLYCOL 3350 17 G PO PACK
17.0000 g | PACK | Freq: Every day | ORAL | Status: DC | PRN
  Administered 2022-06-23 – 2022-06-25 (×2): 17 g via ORAL
  Filled 2022-06-23 (×2): qty 1

## 2022-06-23 MED ORDER — ACETAMINOPHEN 325 MG PO TABS
650.0000 mg | ORAL_TABLET | Freq: Four times a day (QID) | ORAL | Status: DC | PRN
  Administered 2022-06-24 – 2022-06-27 (×9): 650 mg via ORAL
  Filled 2022-06-23 (×9): qty 2

## 2022-06-23 MED ORDER — ACETAMINOPHEN 650 MG RE SUPP: 650.0000 mg | Freq: Four times a day (QID) | RECTAL | Status: DC | PRN

## 2022-06-23 MED ORDER — ARFORMOTEROL TARTRATE 15 MCG/2ML IN NEBU
15.0000 ug | INHALATION_SOLUTION | Freq: Two times a day (BID) | RESPIRATORY_TRACT | Status: DC
  Administered 2022-06-24 – 2022-06-27 (×7): 15 ug via RESPIRATORY_TRACT
  Filled 2022-06-23 (×10): qty 2

## 2022-06-23 MED ORDER — SODIUM CHLORIDE 0.9% FLUSH
3.0000 mL | Freq: Two times a day (BID) | INTRAVENOUS | Status: DC
  Administered 2022-06-24 – 2022-06-27 (×6): 3 mL via INTRAVENOUS

## 2022-06-23 MED ORDER — NICOTINE 14 MG/24HR TD PT24
14.0000 mg | MEDICATED_PATCH | Freq: Every day | TRANSDERMAL | Status: DC
  Administered 2022-06-23 – 2022-06-27 (×5): 14 mg via TRANSDERMAL
  Filled 2022-06-23 (×5): qty 1

## 2022-06-23 NOTE — ED Notes (Signed)
Pt refused standing for orthostatic VS.

## 2022-06-23 NOTE — ED Triage Notes (Signed)
Patient presents to ed via [facility] states she was sitting in her wheelchair and was going to walk to  the living room and upon standing became dizzy and fell face forward, abrasion to right eyebrow, c/o left arm pain , left hip , right posterior shoulder. C/o weakness to  left side from previous stroke. Patient is alert confused at baseline.

## 2022-06-23 NOTE — ED Notes (Signed)
Daughter number Jeannett Senior  [phone] Lelan Pons sister [phone]

## 2022-06-23 NOTE — ED Provider Notes (Signed)
[facility] EMERGENCY DEPARTMENT Provider Note   CSN: [id number] Arrival date & time: 06/23/22  1044     History  Chief Complaint  Patient presents with   Kimberly [name] is a 66 y.o. female.  Patient here with fall.  Patient got up to walk and got dizzy lightheaded and fell.  She did not think she lost consciousness.  She hit her forehead.  She has pain in her right hip and left shoulder.  She is on Eliquis for A-fib.  She lives by herself but has home health aide that comes a couple hours a day.  There was question with EMS about whether or not she has been taking her medications because there were plenty of filled pill packets from October around that had not been taking.  Pillboxes had been full as well.  She is found to be in A-fib with rapid rate.  She does not think she lost consciousness.  Is not been having any chest pain.  She felt like she has had some stomach issues last few days.  But she has been eating and drinking well.  She denies any weakness, numbness, chills.  Still feels dizzy.  The history is provided by the patient.       Home Medications Prior to Admission medications   Medication Sig Start Date End Date Taking? Authorizing Provider  albuterol (VENTOLIN HFA) 108 (90 Base) MCG/ACT inhaler Inhale 2 puffs into the lungs every 6 (six) hours as needed for wheezing or shortness of breath. 02/09/22   Byrum, Rose Fillers, MD  alendronate (FOSAMAX) 70 MG tablet TAKE 1 TABLET BY MOUTH EVERY 7 DAYS. Take WITH A full GLASS OF WATER ON EMPTY stomach 02/10/22   [name], FNP  amLODipine (NORVASC) 5 MG tablet Take 1 tablet (5 mg total) by mouth daily. 08/24/21   [name], MD  atorvastatin (LIPITOR) 80 MG tablet TAKE 1 TABLET BY MOUTH EVERY DAY 12/06/21   [name], FNP  carvedilol (COREG) 6.25 MG tablet TAKE 1 TABLET BY MOUTH TWICE DAILY WITH MEALS 06/15/22   [name], FNP  docusate sodium (COLACE) 100 MG capsule Take 1 capsule (100 mg  total) by mouth 2 (two) times daily as needed for mild constipation. 12/24/20   Thurnell Lose, MD  ELIQUIS 5 MG TABS tablet TAKE 1 TABLET BY MOUTH 2 TIMES DAILY 12/06/21   [name], FNP  FEROSUL 325 (65 Fe) MG tablet TAKE 1 TABLET BY MOUTH EVERY DAY WITH BREAKFAST 02/22/22   [name], MD  hydrocortisone cream 1 % Apply 1 application topically daily as needed for itching.    [provider]  mirtazapine (REMERON) 15 MG tablet TAKE 1 TABLET BY MOUTH AT BEDTIME 06/15/22   [name], FNP  nicotine (NICODERM CQ - DOSED IN MG/24 HOURS) 14 mg/24hr patch Place 1 patch (14 mg total) onto the skin daily. 02/15/22   Warren Lacy, PA-C  nicotine (NICODERM CQ - DOSED IN MG/24 HR) 7 mg/24hr patch Place 1 patch (7 mg total) onto the skin daily. Start After completed 28 days of 14 mg patch. 02/15/22   Warren Lacy, PA-C  pantoprazole (PROTONIX) 40 MG tablet TAKE 1 TABLET BY MOUTH EVERY DAY 06/15/22   [name], FNP  Tiotropium Bromide-Olodaterol (STIOLTO RESPIMAT) 2.5-2.5 MCG/ACT AERS Inhale 2 puffs into the lungs daily. 01/25/21   Collene Gobble, MD  Vitamin D, Ergocalciferol, (DRISDOL) 1.25 MG (50000 UNIT) CAPS capsule Take 1 capsule (  50,000 Units total) by mouth 2 (two) times a week. 02/09/22   [name], FNP      Allergies    Clonidine derivatives and Spironolactone    Review of Systems   Review of Systems  Physical Exam Updated Vital Signs  ED Triage Vitals  Enc Vitals Group     BP 06/23/22 1101 122/80     Pulse Rate 06/23/22 1101 (!) 133     Resp 06/23/22 1101 20     Temp 06/23/22 1101 (!) 97.5 F (36.4 C)     Temp Source 06/23/22 1101 Temporal     SpO2 06/23/22 1101 99 %     Weight 06/23/22 1102 119 lb (54 kg)     Height 06/23/22 1102 '5\' 7"'$  (1.702 m)     Head Circumference --      Peak Flow --      Pain Score 06/23/22 1102 6     Pain Loc --      Pain Edu? --      Excl. in [place]? --     Physical Exam Vitals and nursing note reviewed.   Constitutional:      General: She is not in acute distress.    Appearance: She is well-developed. She is not ill-appearing.  HENT:     Head:     Comments: Abrasion over the right side of the forehead Eyes:     Extraocular Movements: Extraocular movements intact.     Conjunctiva/sclera: Conjunctivae normal.     Pupils: Pupils are equal, round, and reactive to light.  Cardiovascular:     Rate and Rhythm: Tachycardia present. Rhythm irregular.     Heart sounds: No murmur heard. Pulmonary:     Effort: Pulmonary effort is normal. No respiratory distress.     Breath sounds: Normal breath sounds.  Abdominal:     Palpations: Abdomen is soft.     Tenderness: There is no abdominal tenderness.  Musculoskeletal:        General: Tenderness present. No swelling.     Cervical back: Normal range of motion and neck supple.     Comments: Tenderness to the left shoulder, right hip, no midline spinal pain  Skin:    General: Skin is warm and dry.     Capillary Refill: Capillary refill takes less than 2 seconds.  Neurological:     General: No focal deficit present.     Mental Status: She is alert and oriented to person, place, and time.     Cranial Nerves: No cranial nerve deficit.     Sensory: No sensory deficit.     Motor: No weakness.     Coordination: Coordination normal.  Psychiatric:        Mood and Affect: Mood normal.     ED Results / Procedures / Treatments   Labs (all labs ordered are listed, but only abnormal results are displayed) Labs Reviewed  CBC WITH DIFFERENTIAL/PLATELET - Abnormal; Notable for the following components:      Result Value   RBC 5.55 (*)    MCV 71.2 (*)    MCH 22.9 (*)    RDW 17.5 (*)    All other components within normal limits  COMPREHENSIVE METABOLIC PANEL - Abnormal; Notable for the following components:   CO2 21 (*)    Creatinine, Ser 1.23 (*)    Total Protein 6.3 (*)    GFR, Estimated 49 (*)    All other components within normal limits  LIPASE,  BLOOD  TROPONIN  I (HIGH SENSITIVITY)  TROPONIN I (HIGH SENSITIVITY)    EKG EKG Interpretation  Date/Time:  Friday June 23 2022 10:55:47 EST Ventricular Rate:  129 PR Interval:    QRS Duration: 97 QT Interval:  337 QTC Calculation: 494 R Axis:   59 Text Interpretation: Atrial fibrillation w/ rvr Probable LVH with secondary repol abnrm Abnormal T, probable ischemia, lateral leads Confirmed by Lennice Sites ([id number]) on 06/23/2022 12:11:25 PM  Radiology CT Cervical Spine Wo Contrast  Result Date: 06/23/2022 CLINICAL DATA:  66 year old female status post fall. EXAM: CT CERVICAL SPINE WITHOUT CONTRAST TECHNIQUE: Multidetector CT imaging of the cervical spine was performed without intravenous contrast. Multiplanar CT image reconstructions were also generated. RADIATION DOSE REDUCTION: This exam was performed according to the departmental dose-optimization program which includes automated exposure control, adjustment of the mA and/or kV according to patient size and/or use of iterative reconstruction technique. COMPARISON:  Head CT today.  Cervical spine CT 12/18/2020. FINDINGS: Alignment: Stable straightening of cervical lordosis. Cervicothoracic junction alignment is within normal limits. Bilateral posterior element alignment is within normal limits. Skull base and vertebrae: Visualized skull base is intact. No atlanto-occipital dissociation. C1 and C2 appear intact and aligned. No acute osseous abnormality identified. Soft tissues and spinal canal: No prevertebral fluid or swelling. No visible canal hematoma. Extensive calcified carotid atherosclerosis redemonstrated in the neck. Disc levels: Stable. Mild if any associated chronic cervical spinal stenosis. Upper chest: Visible upper thoracic levels appear intact. Centrilobular emphysema in the lung apices with mild respiratory motion. Incidental posterior right superficial intramuscular benign lipoma (no follow-up imaging recommended). IMPRESSION:  1. No acute traumatic injury identified in the cervical spine. 2. Calcified carotid atherosclerosis. Emphysema (ICD10-J43.9). Electronically Signed   By: Genevie Ann M.D.   On: 06/23/2022 12:08   CT Head Wo Contrast  Result Date: 06/23/2022 CLINICAL DATA:  66 year old female status post fall. EXAM: CT HEAD WITHOUT CONTRAST TECHNIQUE: Contiguous axial images were obtained from the base of the skull through the vertex without intravenous contrast. RADIATION DOSE REDUCTION: This exam was performed according to the departmental dose-optimization program which includes automated exposure control, adjustment of the mA and/or kV according to patient size and/or use of iterative reconstruction technique. COMPARISON:  Brain MRI 02/14/2018.  Head CT 12/18/2020. FINDINGS: Brain: Large chronic right PCA territory infarct with encephalomalacia and patchy additional encephalomalacia in posterior right MCA, left anterior and posterior MCA territories. These are stable since 12/18/2020. No superimposed midline shift, ventriculomegaly, mass effect, evidence of mass lesion, intracranial hemorrhage or evidence of cortically based acute infarction. Vascular: Extensive Calcified atherosclerosis at the skull base. No suspicious intracranial vascular hyperdensity. Skull: No acute osseous abnormality identified. Sinuses/Orbits: Visualized paranasal sinuses and mastoids are stable and well aerated. Other: No orbit or scalp soft tissue injury identified. IMPRESSION: 1. No acute traumatic injury identified. 2. Stable non contrast CT appearance of the brain since 2022 with advanced chronic ischemic disease. Electronically Signed   By: Genevie Ann M.D.   On: 06/23/2022 12:04   DG Shoulder Left  Result Date: 06/23/2022 CLINICAL DATA:  Fall EXAM: LEFT SHOULDER - 2+ VIEW COMPARISON:  None Available. FINDINGS: There is no evidence of fracture or dislocation. There is no evidence of arthropathy or other focal bone abnormality. Soft tissues are  unremarkable. IMPRESSION: No fracture or dislocation of the left shoulder. Joint spaces are preserved. Electronically Signed   By: Delanna Ahmadi M.D.   On: 06/23/2022 11:42   DG Pelvis Portable  Result Date: 06/23/2022 CLINICAL DATA:  Status  post fall. EXAM: PORTABLE PELVIS 1-2 VIEWS COMPARISON:  Dec 18, 2020 FINDINGS: There is no evidence of pelvic fracture or diastasis. No pelvic bone lesions are seen. Vascular calcifications noted. IMPRESSION: Negative. Electronically Signed   By: Fidela Salisbury M.D.   On: 06/23/2022 11:26   DG Chest Portable 1 View  Result Date: 06/23/2022 CLINICAL DATA:  Fall EXAM: PORTABLE CHEST 1 VIEW COMPARISON:  12/23/20 CXR FINDINGS: Status post median sternotomy and CABG. Cardiac and mediastinal contours are unchanged from prior exam. Sternotomy wires are intact. Aortic atherosclerotic calcifications. No focal airspace opacity. No pleural effusion. No pneumothorax. No radiographically apparent displaced rib fractures. IMPRESSION: 1. No focal airspace opacity. 2. No radiographically apparent displaced rib fractures. Electronically Signed   By: Marin Roberts M.D.   On: 06/23/2022 11:26    Procedures .Critical Care  Performed by: Lennice Sites, DO Authorized by: Lennice Sites, DO   Critical care provider statement:    Critical care time (minutes):  40   Critical care was necessary to treat or prevent imminent or life-threatening deterioration of the following conditions: atrial fibrillation with RVR.   Critical care was time spent personally by me on the following activities:  Development of treatment plan with patient or surrogate, blood draw for specimens, discussions with consultants, evaluation of patient's response to treatment, examination of patient, obtaining history from patient or surrogate, ordering and review of laboratory studies, ordering and performing treatments and interventions, ordering and review of radiographic studies, re-evaluation of patient's  condition, pulse oximetry and review of old charts     Medications Ordered in ED Medications  diltiazem (CARDIZEM) 1 mg/mL load via infusion 10 mg (10 mg Intravenous Bolus from Bag 06/23/22 1134)    And  diltiazem (CARDIZEM) 125 mg in dextrose 5% 125 mL (1 mg/mL) infusion (0 mg/hr Intravenous Paused 06/23/22 1232)  sodium chloride 0.9 % bolus 1,000 mL (0 mLs Intravenous Stopped 06/23/22 1236)    ED Course/ Medical Decision Making/ A&P                           Medical Decision Making Amount and/or Complexity of Data Reviewed Labs: ordered. Radiology: ordered.  Risk Prescription drug management. Decision regarding hospitalization.   Kimberly [name] is here after syncope type event.  Made a level 2 trauma she was a fall on blood thinners.  History of atrial fibrillation on Eliquis.  History of stroke, high cholesterol, hypertension.  Patient overall with abrasion over the right side of the forehead.  She is found to be in A-fib with RVR upon evaluation in the ED with heart rate in the 140s.  There was concern about maybe her not taking medications per EMS.  Multiple prepackaged pill packages not taking some dating back to October.  Pill boxes that she had also seen to be full.  She does have home health aide that comes 2 hours a day.  But she lives by herself.  She states that she uses a wheelchair but she does walk with a walker.  She got up from her wheelchair started to walk across the room got lightheaded passed out on the ground.  She does not think she lost consciousness.  She states she has not felt well the last several days but denies any chest pain or shortness of breath.  Not sure if A-fib with RVR has been causing the symptoms.  We will start her on IV diltiazem bolus and infusion.  Will give  fluid bolus.  We will check basic labs including troponin, CBC and BMP to evaluate for electrolyte abnormalities or cardiac process.  Differential diagnosis likely syncope in the setting of  arrhythmia.  We will get CT scan head and neck, chest x-ray, pelvic x-ray, left shoulder x-ray to evaluate for traumatic processes.  Anticipate admission.  Patient with improvement of heart rate on diltiazem bolus and infusion.  We will try to transition her to oral medication.  Blood work per my review and interpretation shows no significant anemia or electrolyte abnormality.  Creatinine mildly elevated however.  CT scan of the head and neck per my review and interpretation shows no acute findings.  Chest x-ray, shoulder x-ray, hip x-rays with no acute findings.  Overall patient with episode of syncope likely in the setting of A-fib with RVR.  Question if she is having some social issues at home and having medical compliance with her medications.  We will admit her for further work-up and care to medicine team.  This chart was dictated using voice recognition software.  Despite best efforts to proofread,  errors can occur which can change the documentation meaning.         Final Clinical Impression(s) / ED Diagnoses Final diagnoses:  Atrial fibrillation with rapid ventricular response ([place])  Syncope, unspecified syncope type    Rx / DC Orders ED Discharge Orders     None         Lennice Sites, DO 06/23/22 1317

## 2022-06-23 NOTE — ED Notes (Signed)
Patient c/o neck pain while in CT , Katie TRN applied c-collar

## 2022-06-23 NOTE — Patient Outreach (Signed)
  Care Coordination   06/23/2022 Name: Kimberly [name] MRN: [id number] DOB: 08-18-1955   Care Coordination Outreach Attempts:  An unsuccessful telephone outreach was attempted for a scheduled appointment today.  Follow Up Plan:  Additional outreach attempts will be made to offer the patient care coordination information and services.   Encounter Outcome:  No Answer  Care Coordination Interventions Activated:  No   Care Coordination Interventions:  No, not indicated    Barb Merino, RN, BSN, CCM Care Management Coordinator [facility] Care Management  Direct Phone: [phone]

## 2022-06-23 NOTE — H&P (Addendum)
History and Physical   Kimberly [name] EYC:[id number] DOB: [birth year] DOA: 06/23/2022  PCP: [name], FNP   Patient coming from: Home  Chief Complaint: Fall, near syncope  HPI: Kimberly [name] is a 66 y.o. female with medical history significant of apical variant hypertrophic cardiomyopathy, hyperlipidemia, hypertension, CAD status post CABG, history of CVA with residual right-sided hemiparesis, atrial fibrillation, gout, COPD presenting after near syncope and fall at home.  Patient presenting after a fall at home.  She walks with a walker due to her right-sided hemiparesis from prior stroke, and with a wheelchair for longer distances. She attempted to walk with her walker today and she became lightheaded and dizzy and fell to the floor.  She states she did hit her forehead but denies loss of consciousness.  Of note, she does live alone and only has a home health aide coming for couple hours a day.  EMS was called and on arrival there was some concern that she may not be taking all her medications as there were full pillboxes and pill packets.  Patient was found to be in atrial fibrillation with RVR by EMS.  Patient denies fevers, chills, chest pain, shortness of breath, Donnell pain, constipation, diarrhea, nausea, vomiting.  ED Course: Vital signs in the ED significant for heart rate in the 130s.  Lab work-up included CMP with bicarb 21, creatinine elevated to 1.23 from baseline 0.9, protein 6.3.  CBC within normal limits.  Troponin normal with repeat pending.  Lipase normal.  Chest x-ray showed no acute normality, left shoulder x-ray showed no acute abnormality, pelvis x-ray showed no acute abnormality.  CT head showed no acute normality and did show stable chronic changes.  CT of the C-spine showed no acute abnormality but did show chronic carotid atherosclerosis.  Patient started on Dilt drip in the ED and given a liter of IV fluids.  Review of Systems: As per HPI otherwise all other  systems reviewed and are negative.  Past Medical History:  Diagnosis Date   Bicipital tenosynovitis    Cerebral artery occlusion with cerebral infarction ([place]) 12/05/2007   Qualifier: Diagnosis of   By: Radene Ou MD, [place]       Coronary artery disease 05/2007   [id number]    Fall 02/13/2018   Family history of ischemic heart disease    Gout    Hammer toe    Hip, thigh, leg, and ankle, insect bite, nonvenomous, without mention of infection(916.4)    Hyperlipidemia    Hypertension    Osteoporosis 02/07/2022   Other abnormality of red blood cells    Rotator cuff syndrome of left shoulder    Stroke [facility])     Past Surgical History:  Procedure Laterality Date   CARDIAC CATHETERIZATION     COLONOSCOPY WITH PROPOFOL N/A 01/30/2020   Procedure: COLONOSCOPY WITH PROPOFOL;  Surgeon: Carol Ada, MD;  Location: [facility];  Service: Endoscopy;  Laterality: N/A;   CORONARY ARTERY BYPASS GRAFT     triple   CORONARY ARTERY BYPASS GRAFT  2008   POLYPECTOMY  01/30/2020   Procedure: POLYPECTOMY;  Surgeon: Carol Ada, MD;  Location: [facility];  Service: Endoscopy;;   TUBAL LIGATION      Social History  reports that she quit smoking about 2 years ago. Her smoking use included cigarettes. She has a 70.00 pack-year smoking history. She quit smokeless tobacco use about 13 years ago. She reports that she does not currently use alcohol. She reports that she does not currently use  drugs after having used the following drugs: Marijuana.  Allergies  Allergen Reactions   Clonidine Derivatives Other (See Comments)    Patch causes scars   Spironolactone Rash    Family History  Problem Relation Age of Onset   Hypertension Mother    Heart attack Father    Diabetes Father    Kidney failure Father    Anxiety disorder Sister    Sarcoidosis Sister   Reviewed on admission  Prior to Admission medications   Medication Sig Start Date End Date Taking? Authorizing Provider  albuterol (VENTOLIN  HFA) 108 (90 Base) MCG/ACT inhaler Inhale 2 puffs into the lungs every 6 (six) hours as needed for wheezing or shortness of breath. 02/09/22   Byrum, Rose Fillers, MD  alendronate (FOSAMAX) 70 MG tablet TAKE 1 TABLET BY MOUTH EVERY 7 DAYS. Take WITH A full GLASS OF WATER ON EMPTY stomach 02/10/22   [name], FNP  amLODipine (NORVASC) 5 MG tablet Take 1 tablet (5 mg total) by mouth daily. 08/24/21   [name], MD  atorvastatin (LIPITOR) 80 MG tablet TAKE 1 TABLET BY MOUTH EVERY DAY 12/06/21   [name], FNP  carvedilol (COREG) 6.25 MG tablet TAKE 1 TABLET BY MOUTH TWICE DAILY WITH MEALS 06/15/22   [name], FNP  docusate sodium (COLACE) 100 MG capsule Take 1 capsule (100 mg total) by mouth 2 (two) times daily as needed for mild constipation. 12/24/20   Thurnell Lose, MD  ELIQUIS 5 MG TABS tablet TAKE 1 TABLET BY MOUTH 2 TIMES DAILY 12/06/21   [name], FNP  FEROSUL 325 (65 Fe) MG tablet TAKE 1 TABLET BY MOUTH EVERY DAY WITH BREAKFAST 02/22/22   [name], MD  hydrocortisone cream 1 % Apply 1 application topically daily as needed for itching.    [provider]  mirtazapine (REMERON) 15 MG tablet TAKE 1 TABLET BY MOUTH AT BEDTIME 06/15/22   [name], FNP  nicotine (NICODERM CQ - DOSED IN MG/24 HOURS) 14 mg/24hr patch Place 1 patch (14 mg total) onto the skin daily. 02/15/22   Warren Lacy, PA-C  nicotine (NICODERM CQ - DOSED IN MG/24 HR) 7 mg/24hr patch Place 1 patch (7 mg total) onto the skin daily. Start After completed 28 days of 14 mg patch. 02/15/22   Warren Lacy, PA-C  pantoprazole (PROTONIX) 40 MG tablet TAKE 1 TABLET BY MOUTH EVERY DAY 06/15/22   [name], FNP  Tiotropium Bromide-Olodaterol (STIOLTO RESPIMAT) 2.5-2.5 MCG/ACT AERS Inhale 2 puffs into the lungs daily. 01/25/21   Collene Gobble, MD  Vitamin D, Ergocalciferol, (DRISDOL) 1.25 MG (50000 UNIT) CAPS capsule Take 1 capsule (50,000 Units total) by mouth 2 (two) times a week. 02/09/22    [name], FNP    Physical Exam: Vitals:   06/23/22 1101 06/23/22 1102  BP: 122/80   Pulse: (!) 133   Resp: 20   Temp: (!) 97.5 F (36.4 C)   TempSrc: Temporal   SpO2: 99%   Weight:  54 kg  Height:  '5\' 7"'$  (1.702 m)    Physical Exam Constitutional:      General: She is not in acute distress.    Appearance: Normal appearance.  HENT:     Head: Normocephalic and atraumatic.     Mouth/Throat:     Mouth: Mucous membranes are moist.     Pharynx: Oropharynx is clear.  Eyes:     Extraocular Movements: Extraocular movements intact.     Pupils: Pupils are equal, round,  and reactive to light.  Cardiovascular:     Rate and Rhythm: Normal rate. Rhythm irregular.     Pulses: Normal pulses.     Heart sounds: Normal heart sounds.  Pulmonary:     Effort: Pulmonary effort is normal. No respiratory distress.     Breath sounds: Normal breath sounds.  Abdominal:     General: Bowel sounds are normal. There is no distension.     Palpations: Abdomen is soft.     Tenderness: There is no abdominal tenderness.  Musculoskeletal:        General: No swelling or deformity.  Skin:    General: Skin is warm and dry.  Neurological:     General: No focal deficit present.     Mental Status: Mental status is at baseline.     Labs on Admission: I have personally reviewed following labs and imaging studies  CBC: Recent Labs  Lab 06/23/22 1118  WBC 7.4  NEUTROABS 5.3  HGB 12.7  HCT 39.5  MCV 71.2*  PLT 976    Basic Metabolic Panel: Recent Labs  Lab 06/23/22 1118  NA 145  K 3.8  CL 110  CO2 21*  GLUCOSE 81  BUN 17  CREATININE 1.23*  CALCIUM 9.6    GFR: Estimated Creatinine Clearance: 38.9 mL/min (A) (by C-G formula based on SCr of 1.23 mg/dL (H)).  Liver Function Tests: Recent Labs  Lab 06/23/22 1118  AST 24  ALT 28  ALKPHOS 70  BILITOT 0.7  PROT 6.3*  ALBUMIN 4.0    Urine analysis:    Component Value Date/Time   COLORURINE YELLOW 12/18/2020 1830    APPEARANCEUR CLEAR 12/18/2020 1830   LABSPEC 1.014 12/18/2020 1830   PHURINE 6.0 12/18/2020 1830   GLUCOSEU NEGATIVE 12/18/2020 1830   HGBUR SMALL (A) 12/18/2020 1830   BILIRUBINUR NEGATIVE 12/18/2020 1830   BILIRUBINUR negative 11/18/2020 1718   KETONESUR NEGATIVE 12/18/2020 1830   PROTEINUR NEGATIVE 12/18/2020 1830   UROBILINOGEN 0.2 11/18/2020 1718   UROBILINOGEN 0.2 05/26/2011 2243   NITRITE NEGATIVE 12/18/2020 1830   LEUKOCYTESUR NEGATIVE 12/18/2020 1830    Radiological Exams on Admission: CT Cervical Spine Wo Contrast  Result Date: 06/23/2022 CLINICAL DATA:  66 year old female status post fall. EXAM: CT CERVICAL SPINE WITHOUT CONTRAST TECHNIQUE: Multidetector CT imaging of the cervical spine was performed without intravenous contrast. Multiplanar CT image reconstructions were also generated. RADIATION DOSE REDUCTION: This exam was performed according to the departmental dose-optimization program which includes automated exposure control, adjustment of the mA and/or kV according to patient size and/or use of iterative reconstruction technique. COMPARISON:  Head CT today.  Cervical spine CT 12/18/2020. FINDINGS: Alignment: Stable straightening of cervical lordosis. Cervicothoracic junction alignment is within normal limits. Bilateral posterior element alignment is within normal limits. Skull base and vertebrae: Visualized skull base is intact. No atlanto-occipital dissociation. C1 and C2 appear intact and aligned. No acute osseous abnormality identified. Soft tissues and spinal canal: No prevertebral fluid or swelling. No visible canal hematoma. Extensive calcified carotid atherosclerosis redemonstrated in the neck. Disc levels: Stable. Mild if any associated chronic cervical spinal stenosis. Upper chest: Visible upper thoracic levels appear intact. Centrilobular emphysema in the lung apices with mild respiratory motion. Incidental posterior right superficial intramuscular benign lipoma (no  follow-up imaging recommended). IMPRESSION: 1. No acute traumatic injury identified in the cervical spine. 2. Calcified carotid atherosclerosis. Emphysema (ICD10-J43.9). Electronically Signed   By: Genevie Ann M.D.   On: 06/23/2022 12:08   CT Head Wo Contrast  Result Date: 06/23/2022 CLINICAL DATA:  66 year old female status post fall. EXAM: CT HEAD WITHOUT CONTRAST TECHNIQUE: Contiguous axial images were obtained from the base of the skull through the vertex without intravenous contrast. RADIATION DOSE REDUCTION: This exam was performed according to the departmental dose-optimization program which includes automated exposure control, adjustment of the mA and/or kV according to patient size and/or use of iterative reconstruction technique. COMPARISON:  Brain MRI 02/14/2018.  Head CT 12/18/2020. FINDINGS: Brain: Large chronic right PCA territory infarct with encephalomalacia and patchy additional encephalomalacia in posterior right MCA, left anterior and posterior MCA territories. These are stable since 12/18/2020. No superimposed midline shift, ventriculomegaly, mass effect, evidence of mass lesion, intracranial hemorrhage or evidence of cortically based acute infarction. Vascular: Extensive Calcified atherosclerosis at the skull base. No suspicious intracranial vascular hyperdensity. Skull: No acute osseous abnormality identified. Sinuses/Orbits: Visualized paranasal sinuses and mastoids are stable and well aerated. Other: No orbit or scalp soft tissue injury identified. IMPRESSION: 1. No acute traumatic injury identified. 2. Stable non contrast CT appearance of the brain since 2022 with advanced chronic ischemic disease. Electronically Signed   By: Genevie Ann M.D.   On: 06/23/2022 12:04   DG Shoulder Left  Result Date: 06/23/2022 CLINICAL DATA:  Fall EXAM: LEFT SHOULDER - 2+ VIEW COMPARISON:  None Available. FINDINGS: There is no evidence of fracture or dislocation. There is no evidence of arthropathy or other  focal bone abnormality. Soft tissues are unremarkable. IMPRESSION: No fracture or dislocation of the left shoulder. Joint spaces are preserved. Electronically Signed   By: Delanna Ahmadi M.D.   On: 06/23/2022 11:42   DG Pelvis Portable  Result Date: 06/23/2022 CLINICAL DATA:  Status post fall. EXAM: PORTABLE PELVIS 1-2 VIEWS COMPARISON:  Dec 18, 2020 FINDINGS: There is no evidence of pelvic fracture or diastasis. No pelvic bone lesions are seen. Vascular calcifications noted. IMPRESSION: Negative. Electronically Signed   By: Fidela Salisbury M.D.   On: 06/23/2022 11:26   DG Chest Portable 1 View  Result Date: 06/23/2022 CLINICAL DATA:  Fall EXAM: PORTABLE CHEST 1 VIEW COMPARISON:  12/23/20 CXR FINDINGS: Status post median sternotomy and CABG. Cardiac and mediastinal contours are unchanged from prior exam. Sternotomy wires are intact. Aortic atherosclerotic calcifications. No focal airspace opacity. No pleural effusion. No pneumothorax. No radiographically apparent displaced rib fractures. IMPRESSION: 1. No focal airspace opacity. 2. No radiographically apparent displaced rib fractures. Electronically Signed   By: Marin Roberts M.D.   On: 06/23/2022 11:26    EKG: Independently reviewed.  Atrial fibrillation with RVR at 129 bpm.  Evidence of LVH.  T wave inversion in depression and lateral leads possibly due to repolarization abnormality.  Assessment/Plan Principal Problem:   Atrial fibrillation with RVR (HCC) Active Problems:   Dyslipidemia   Essential hypertension   Atherosclerosis of coronary artery bypass graft(s), unspecified, with other forms of angina pectoris ([place])   History of CVA (cerebrovascular accident)   Coronary artery disease involving native coronary artery of native heart without angina pectoris   Apical variant hypertrophic cardiomyopathy (HCC)   Hemiparesis affecting nondominant 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 ([place])   Chronic gout without tophus   AKI  (acute kidney injury) ([place])   Postural dizziness with near syncope   Atrial fibrillation with RVR Near syncope > Patient presenting after near syncopal event at home.  She gets around usually with a wheelchair but can get up and walk at times.  She attempted to sit today and got lightheaded and  dizzy upon standing and fell. > On EMS arrival patient was in A-fib with RVR and they noted multiple pill packs that not been taken and pillboxes that were full.  Initial concern for not taking all home meds. Daughter confirms that she makes sure patient takes all her meds. > Near syncope most likely related to A-fib with RVR with possible orthostatic component given mild AKI as below likely due to a degree of dehydration. > Started on diltiazem drip in the ED with improvement in rate already. - Monitor in stepdown unit with telemetry for now in case patient needs to go back on infusion - Continue home carvedilol, may need dose increased with breakthrough RVR - Continue home Eliquis - Echocardiogram - Orthostatic vital signs  AKI > Creatinine elevated to 1.23 from baseline 0.9.  Appears somewhat dry on exam.  Does McAteer for AKI.  Possibly contributing to her near syncope in addition to the RVR episode as above. > Received a liter of fluid in the ED. - Continue with IV fluids overnight - Trend renal function and electrolytes  History of stroke > Residual right-sided hemiparesis.  Primarily gets around with a walker, and at times needs wheelchair. - Continue home atorvastatin, Eliquis   CAD Hyperlipidemia > Status post CABG x3 in 2008. - Continue home carvedilol, atorvastatin, Eliquis  COPD - Replace home Stiolto with formulary [place] and [place] as needed albuterol  Tobacco use - Nicotine patch  DVT prophylaxis: Eliquis Code Status:   Full Family Communication:  Updated at bedside Disposition Plan:   Patient is from:  Home  Anticipated DC to:  Home  Anticipated DC  date:  1 to 2 days  Anticipated DC barriers: None  Consults called:  None Admission status:  Observation, progressive  Severity of Illness: The appropriate patient status for this patient is OBSERVATION. Observation status is judged to be reasonable and necessary in order to provide the required intensity of service to ensure the patient's safety. The patient's presenting symptoms, physical exam findings, and initial radiographic and laboratory data in the context of their medical condition is felt to place them at decreased risk for further clinical deterioration. Furthermore, it is anticipated that the patient will be medically stable for discharge from the hospital within 2 midnights of admission.    Marcelyn Bruins MD [facility]  How to contact the [facility] Attending or Consulting provider [place] or covering provider during after hours [place], for this patient?   Check the care team in [facility] and look for a) attending/consulting [facility] provider listed and b) the [facility] team listed Log into [email or web address] and use [place]'s universal password to access. If you do not have the password, please contact the hospital operator. Locate the [facility] provider you are looking for under [facility] and page to a number that you can be directly reached. If you still have difficulty reaching the provider, please page the [facility] (Director on Call) for the Hospitalists listed on amion for assistance.  06/23/2022, 1:21 PM

## 2022-06-23 NOTE — Progress Notes (Signed)
Provided support to patient and staff.  Chaplain available as needed.  Jaclynn Major, [place], [facility], Pager [phone]

## 2022-06-23 NOTE — Progress Notes (Signed)
Echocardiogram 2D Echocardiogram has been performed.  [place] Deputy Sujata Maines RDCS 06/23/2022, 2:12 PM

## 2022-06-23 NOTE — ED Notes (Signed)
Trauma Response Nurse Documentation   Kimberly [name] is a 66 y.o. female arriving to [facility] ED via EMS  On Eliquis (apixaban) daily. Trauma was activated as a Level 2 by ED Charge RN based on the following trauma criteria Elderly patients > 65 with head trauma on anti-coagulation (excluding ASA). Trauma team at the bedside on patient arrival.   Patient cleared for CT by Dr. Ronnald Nian. Pt transported to CT with trauma response nurse present to monitor. RN remained with the patient throughout their absence from the department for clinical observation.   GCS 14-15 (baseline).  History   Past Medical History:  Diagnosis Date   Bicipital tenosynovitis    Coronary artery disease 05/2007   [id number]    Fall 02/13/2018   Family history of ischemic heart disease    Gout    Hammer toe    Hip, thigh, leg, and ankle, insect bite, nonvenomous, without mention of infection(916.4)    Hyperlipidemia    Hypertension    Osteoporosis 02/07/2022   Other abnormality of red blood cells    Rotator cuff syndrome of left shoulder    Stroke [facility])      Past Surgical History:  Procedure Laterality Date   CARDIAC CATHETERIZATION     COLONOSCOPY WITH PROPOFOL N/A 01/30/2020   Procedure: COLONOSCOPY WITH PROPOFOL;  Surgeon: Carol Ada, MD;  [facility];  Service: Endoscopy;  Laterality: N/A;   CORONARY ARTERY BYPASS GRAFT     triple   CORONARY ARTERY BYPASS GRAFT  2008   POLYPECTOMY  01/30/2020   Procedure: POLYPECTOMY;  Surgeon: Carol Ada, MD;  [facility];  Service: Endoscopy;;   TUBAL LIGATION       Initial Focused Assessment (If applicable, or please see trauma documentation): - GCS 14 (mild baseline confusion) - A/Ox4 - PERRLA - a-fib w/ RVR - c/o L arm pain - abrasion above right eyebrow  CT's Completed:   CT Head and CT C-Spine   Interventions:  - CXR - Pelvic XR - L arm XR - 20G L FA PIV  - CT head and c-spine - C-collar applied - labs drawn  Plan  for disposition:  Other   Consults completed:  none at 1200.  Event Summary: Pt was BIB [facility] after attempting to get out of wheelchair, having syncopal episode, resulting in falling face forward. Pt does not remember having positive LOC. Pt was taken to CT where she started complaining of neck pain.  C-collar applied at this time.   MTP Summary (If applicable):   Bedside handoff with ED RN Kristeen Miss, Josefina Do  Trauma Response RN  Please call TRN at [phone] for further assistance.

## 2022-06-23 NOTE — ED Notes (Signed)
Family at bedside. 

## 2022-06-24 DIAGNOSIS — I480 Paroxysmal atrial fibrillation: Secondary | ICD-10-CM | POA: Diagnosis not present

## 2022-06-24 DIAGNOSIS — M1A9XX0 Chronic gout, unspecified, without tophus (tophi): Secondary | ICD-10-CM

## 2022-06-24 DIAGNOSIS — I422 Other hypertrophic cardiomyopathy: Secondary | ICD-10-CM | POA: Diagnosis not present

## 2022-06-24 DIAGNOSIS — I4891 Unspecified atrial fibrillation: Secondary | ICD-10-CM | POA: Diagnosis not present

## 2022-06-24 DIAGNOSIS — N179 Acute kidney failure, unspecified: Secondary | ICD-10-CM | POA: Diagnosis not present

## 2022-06-24 DIAGNOSIS — I25708 Atherosclerosis of coronary artery bypass graft(s), unspecified, with other forms of angina pectoris: Secondary | ICD-10-CM | POA: Diagnosis not present

## 2022-06-24 LAB — COMPREHENSIVE METABOLIC PANEL
ALT: 26 U/L (ref 0–44)
AST: 37 U/L (ref 15–41)
Albumin: 3.3 g/dL — ABNORMAL LOW (ref 3.5–5.0)
Alkaline Phosphatase: 57 U/L (ref 38–126)
Anion gap: 7 (ref 5–15)
BUN: 13 mg/dL (ref 8–23)
CO2: 19 mmol/L — ABNORMAL LOW (ref 22–32)
Calcium: 8.8 mg/dL — ABNORMAL LOW (ref 8.9–10.3)
Chloride: 115 mmol/L — ABNORMAL HIGH (ref 98–111)
Creatinine, Ser: 0.93 mg/dL (ref 0.44–1.00)
GFR, Estimated: >60 mL/min (ref >60)
Glucose, Bld: 85 mg/dL (ref 70–99)
Potassium: 3.6 mmol/L (ref 3.5–5.1)
Sodium: 141 mmol/L (ref 135–145)
Total Bilirubin: 0.2 mg/dL — ABNORMAL LOW (ref 0.3–1.2)
Total Protein: 5.5 g/dL — ABNORMAL LOW (ref 6.5–8.1)

## 2022-06-24 LAB — CBC
HCT: 37 % (ref 36.0–46.0)
Hemoglobin: 11.4 g/dL — ABNORMAL LOW (ref 12.0–15.0)
MCH: 22.5 pg — ABNORMAL LOW (ref 26.0–34.0)
MCHC: 30.8 g/dL (ref 30.0–36.0)
MCV: 73.1 fL — ABNORMAL LOW (ref 80.0–100.0)
Platelets: 176 10*3/uL (ref 150–400)
RBC: 5.06 MIL/uL (ref 3.87–5.11)
RDW: 17.5 % — ABNORMAL HIGH (ref 11.5–15.5)
WBC: 6.3 10*3/uL (ref 4.0–10.5)
nRBC: 0 % (ref 0.0–0.2)

## 2022-06-24 LAB — RESPIRATORY PANEL BY PCR

## 2022-06-24 LAB — RESP PANEL BY RT-PCR (FLU A&B, COVID) ARPGX2
Influenza A by PCR: NEGATIVE
Influenza B by PCR: NEGATIVE
SARS Coronavirus 2 by RT PCR: NEGATIVE

## 2022-06-24 LAB — HIV ANTIBODY (ROUTINE TESTING W REFLEX): HIV Screen 4th Generation wRfx: NONREACTIVE

## 2022-06-24 MED ORDER — MUPIROCIN 2 % EX OINT
TOPICAL_OINTMENT | Freq: Two times a day (BID) | CUTANEOUS | Status: DC
  Filled 2022-06-24 (×2): qty 22

## 2022-06-24 MED ORDER — CARVEDILOL 12.5 MG PO TABS: 12.5000 mg | ORAL_TABLET | Freq: Two times a day (BID) | ORAL | 0 refills | Status: DC

## 2022-06-24 MED ORDER — FLUTICASONE PROPIONATE 50 MCG/ACT NA SUSP: 2.0000 | Freq: Every day | NASAL | 0 refills | Status: DC

## 2022-06-24 MED ORDER — CARVEDILOL 12.5 MG PO TABS
12.5000 mg | ORAL_TABLET | Freq: Two times a day (BID) | ORAL | Status: DC
  Administered 2022-06-24 – 2022-06-27 (×8): 12.5 mg via ORAL
  Filled 2022-06-24 (×8): qty 1

## 2022-06-24 NOTE — TOC Initial Note (Addendum)
Transition of Care [facility] - Initial/Assessment Note    Patient Details  Name: Kimberly [name] MRN: [id number] Date of Birth: 06-01-1956  Transition of Care [facility]) CM/SW Contact:    Verdell Carmine, RN Phone Number: 06/24/2022, 11:02 AM  Clinical Narrative:                 Patient presented with fall, forehead laceration, and atrial fibrillation RVR. She lives in a apartment alone and has PCS services 7 days a week for 2.5 hours currently. She has a walker, wheelchair and shower chair. She is agreeable to Home Health services. She is awaiting PT evaluation, will likely be discharged today.  She asked that I speak to her sister Lelan Pons regarding possibility of transportation home. Spoke with Lelan Pons. She is working today but will pick her up when ready. She is worried about getting the correct medications, [facility] is not open 24/7 requests that scripts be sent to [facility] [place]. Messaged provider with request and discussed HH.  PT and OT. Discussed with both patient and sister if more PCS hours are needed this would come from PCP.  TOC will follow for additional needs, recommendations, and transitions of care 1128 Home health orders noted, patient accepted for services [facility] rep Adela Lank. 1400 PT evaluation revealed recommendation for SNF. Discussed with team/provider. Daughter is on vacation, sister is unable to stay with patient if she cannot ambulate. SNF will be persued  Expected Discharge Plan: [place]     Patient Goals and CMS Choice        Expected Discharge Plan and Services Expected Discharge Plan: [place]   Discharge Planning Services: CM Consult Post Acute Care Choice: [place] arrangements for the past 2 months: Apartment                                      Prior Living Arrangements/Services Living arrangements for the past 2 months: Apartment Lives with:: Self Patient language and  need for interpreter reviewed:: Yes Do you feel safe going back to the place where you live?: Yes      Need for Family Participation in Patient Care: Yes (Comment) Care giver support system in place?: Yes (comment) Current home services: Homehealth aide Criminal Activity/Legal Involvement Pertinent to Current Situation/Hospitalization: No - Comment as needed  Activities of Daily Living      Permission Sought/Granted                  Emotional Assessment       Orientation: : Oriented to Self, Oriented to Place, Oriented to  Time, Oriented to Situation Alcohol / Substance Use: Not Applicable Psych Involvement: No (comment)  Admission diagnosis:  Atrial fibrillation with RVR ([place]) [I48.91] Patient Active Problem List   Diagnosis Date Noted   Postural dizziness with near syncope 06/23/2022   COPD (chronic obstructive pulmonary disease) ([place]) 02/09/2022   Osteoporosis 02/07/2022   Pulmonary nodule 01/25/2021   Shortness of breath 01/25/2021   Sepsis ([place]) 12/18/2020   Family history of malignant neoplasm of gastrointestinal tract 12/07/2020   Personal history of colonic polyps 12/07/2020   Secondary hypercoagulable state ([place]) 10/14/2020   Right sided abdominal pain    Abdominal wall pain    Educated about COVID-19 virus infection 11/16/2019   Urinary frequency 04/14/2019   Cough 04/14/2019   AKI (acute kidney injury) ([place])  11/26/2018   Diarrhea 11/25/2018   Dehydration, mild 11/25/2018   Hemiparesis affecting nondominant 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 ([place]) 07/31/2018   Muscle ache 07/31/2018   Prediabetes 07/31/2018   Chronic gout without tophus 07/31/2018   Vision loss 07/31/2018   Malnutrition of moderate degree 02/15/2018   Carotid occlusion, bilateral    Atrial fibrillation with RVR ([place]) 02/13/2018   Positive D dimer 02/13/2018   Back pain 02/13/2018   History of CVA (cerebrovascular accident) 02/13/2018   Paroxysmal atrial fibrillation  ([place]) 02/13/2018   Coronary artery disease involving native coronary artery of native heart without angina pectoris 02/13/2018   Apical variant hypertrophic cardiomyopathy ([place]) 02/13/2018   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[place]) 02/13/2018   Aortic atherosclerosis ([place]) 02/13/2018   TOBACCO USE, QUIT 05/03/2009   Atherosclerosis of coronary artery bypass graft(s), unspecified, with other forms of angina pectoris ([place])    ROTATOR CUFF SYNDROME, LEFT 02/12/2009   Shoulder pain, left 11/03/2008   HAMMER TOE, ACQUIRED 02/11/2008   INSECT BITE, LEG 02/11/2008   Dyslipidemia 11/05/2007   UNSPECIFIED DISORDER TEETH&SUPPORTING STRUCTURES 11/05/2007   BICEPS TENDINITIS, RIGHT 04/24/2007   Essential hypertension 04/23/2007   MICROCYTOSIS 04/23/2007   PCP:  [name], FNP Pharmacy:   [facility] [place] [address] Phone: [phone] Fax: [place] [address], [place] [address] Phone: [phone] Fax: [phone]  [place], [place] - [address] [place] [address] Phone: [phone] Fax: [phone]  [facility] - [place], [place] - [address] [place] [address] [place] [address] Phone: [phone] Fax: [phone]  [id number] - [place], [place] - [address] [place] [address] Phone: [phone] Fax: [phone]     Social Determinants of Health ([place]) Interventions    Readmission Risk Interventions    12/21/2020   11:08 AM  Readmission Risk Prevention Plan  Transportation Screening Complete  PCP or Specialist Appt within 5-7 Days Complete  Home Care Screening Complete  Medication Review (RN CM) Referral to Pharmacy

## 2022-06-24 NOTE — Progress Notes (Signed)
[facility] PROGRESS NOTE  Kimberly [name] (DOB: 1956/06/09) BOF:[id number] PCP: Kimberly [name], [facility] Specialists: Cardiology, Caron Presume, PA. Pulmonology, Dr. Lamonte Sakai. Podiatry, Dr. Elisha Ponder.  Brief Narrative: Kimberly [name] is a 66 y.o. female with a history of apical hypertrophic cardiomyopathy, CAD s/p CABG, PAF, CVA with residual hemiparesis, HTN, HLD, COPD, ongoing tobacco use who presented to the ED on 06/23/2022 after a fall at home. She's had some runny nose/congestion for the past few days, eating and drinking less than usual, and when attempting to walk with her walker, fell and hit her head on the floor. She was found to be in AFib with heart rate in 130's. SCr elevated at 1.23 (baseline 0.9), CBC wnl, Tn wnl x2. CXR, head CT and cervical spine CT all nonacute. IV fluids were given and the patient was admitted for AKI, AFib with RVR and concern for falling at home with PT evaluation pending. Diltiazem was bolused with plans for infusion which was not necessary due to prompt control of heart rate. Her home coreg dose has been increased with durably controlled ventricular rates. Creatinine returned to baseline. Echocardiogram shows apical hypertrophy and preserved LVEF stable from prior.   Evaluation by physical therapy showed the patient is not able to mobilize safely without assistance. She does not have consistent assistance at home, so muscular conditioning/rehabilitation is needed prior to return home. SNF placement is being pursued.   Subjective: Eating breakfast as usual, doesn't feel a diminished appetite at this time. Denies palpitations, dyspnea, chest pain. Has had runny nose and fatigue, decreased appetite and mobility of late, muscle aches.   Objective: BP (!) 128/103   Pulse 76   Temp 98.4 F (36.9 C) (Oral)   Resp 16   Ht '5\' 7"'$  (1.702 m)   Wt 54 kg   SpO2 100%   BMI 18.64 kg/m   Gen: 66yo F appearing older than stated age.  Pulm: Clear,  diminished, no wheezes or crackles  CV: Irreg irreg, rate in 70's, BP normal. No JVD or significant pitting edema GI: Soft, NT, ND, +BS  Neuro: Alert and oriented, left hemiparesis without new focal deficits. Ext: Warm, no deformities, swan neck deformity left hand Skin: No rashes, lesions or ulcers on visualized skin   Echocardiogram 06/24/2022:  - LVEF 60 to 65%, no RWMA. +severe concentric LVH of apical segment.  - RVSF mod-severely reduced, normal PASP, RVSP estimated 30.79mHg (prior 537mg). IVC normal caliber, blunted respirophasic response.  - Severe LAE, mod RAE. Mild MR.  - 4235mscending aorta dilatation (prior 67m70mAssessment & Plan: Principal Problem:   Atrial fibrillation with RVR (HCC) Active Problems:   Dyslipidemia   Essential hypertension   Atherosclerosis of coronary artery bypass graft(s), unspecified, with other forms of angina pectoris (HCC)Kimberly [name] of CVA (cerebrovascular accident)   Coronary artery disease involving native coronary artery of native heart without angina pectoris   Apical variant hypertrophic cardiomyopathy (HCC)   Hemiparesis affecting nondominant 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 (HCC)[place]ronic gout without tophus   AKI (acute kidney injury) (HCC)[place]ervillePostural dizziness with near syncope  Atrial fibrillation with RVR: Precipitated by dehydration, URI, rate now controlled, asymptomatic at this rate.  - Increase home coreg, BP is very stable. - Continue home eliquis given her very high CHA2DS2-VASc score and hx CVA, though with head trauma (negative imaging) and frequent falls, it may be worth reexamining this.  - Since she's remaining in the hospital,  will continue cardiac monitoring for now, but does not need progressive level of care.   URI: No evidence of pneumonia or bacterial infection at this time.  - Symptomatic management, check flu, covid, RVP as this would change isolation requirements.   Near syncope: No  orthostatic hypotension or symptoms with PT today, though she is at exceedingly high risk for falls and has been doing so at home more often.  - Continue PT/OT while here as much as possible.    AKI: Due to dehydration from poor oral intake from likely viral URI. Improved with some IVF.  - Stop IVF for now given blunted IVC, and RV systolic dysfunction.  - Push po intake.  - Avoid nephrotoxins.    History of stroke: Residual hemiparesis. Primarily gets around with a walker, and at times needs wheelchair. Unfortunately, this acute event seems to have pushed her just a bit but enough into debility that requires rehabilitation to safely return home. She does not have a sufficient level of assistance at home to safely discharge at this time.  - Continue eliquis, statin   CAD s/p 3v CABG 2008, apical variant HCM, HTN, HLD: Echo as reported above is stable. Preserved LVEF.   - Continue home carvedilol, atorvastatin, eliquis, norvasc   COPD: No exacerbation at this time. - Replace home Stiolto with formulary Brovana and [place] as needed albuterol   Tobacco use: - Cessation counseling - Nicotine patch  Patrecia Pour, MD [facility] [email or web address] 06/24/2022, 4:17 PM

## 2022-06-24 NOTE — ED Notes (Signed)
ED TO INPATIENT HANDOFF REPORT  ED Nurse Name and Phone #: Andee Poles [phone]  S Name/Age/Gender Kimberly [name] 66 y.o. female Room/Bed: 002C/002C  Code Status   Code Status: Full Code  Home/SNF/Other Home Patient oriented to: self, place, time, and situation Is this baseline? Yes   Triage Complete: Triage complete  Chief Complaint Atrial fibrillation with RVR ([place]) [I48.91]  Triage Note Patient presents to ed via [facility] states she was sitting in her wheelchair and was going to walk to  the living room and upon standing became dizzy and fell face forward, abrasion to right eyebrow, c/o left arm pain , left hip , right posterior shoulder. C/o weakness to  left side from previous stroke. Patient is alert confused at baseline.    Allergies Allergies  Allergen Reactions   Clonidine Derivatives Other (See Comments)    Patch causes scars   Spironolactone Rash    Level of Care/Admitting Diagnosis ED Disposition     ED Disposition  Discharge   Condition  --   Comment  Hospital Area: [place] [[id number]] Level of Care: Progressive [102] Admit to Progressive based on following criteria: CARDIOVASCULAR & THORACIC of moderate stability with acute coronary syndrome symptoms/low risk myocardial infarc tion/hypertensive urgency/arrhythmias/heart failure potentially compromising stability and stable post cardiovascular intervention patients. May place patient in observation at [facility] or [place] if equivalent level of care is available:: No  Covid Evaluation: Asymptomatic - no recent exposure (last 10 days) testing not required Diagnosis: Atrial fibrillation with RVR [facility]) [[id number]] Admitting Physician: Marcelyn Bruins [[id number]] Attending Physician: Marcelyn Bruins [[id number]]          B Medical/Surgery History Past Medical History:  Diagnosis Date   Bicipital tenosynovitis    Cerebral artery occlusion with cerebral infarction ([place])  12/05/2007   Qualifier: Diagnosis of   By: Radene Ou MD, [place]       Coronary artery disease 05/2007   [id number]    Fall 02/13/2018   Family history of ischemic heart disease    Gout    Hammer toe    Hip, thigh, leg, and ankle, insect bite, nonvenomous, without mention of infection(916.4)    Hyperlipidemia    Hypertension    Osteoporosis 02/07/2022   Other abnormality of red blood cells    Rotator cuff syndrome of left shoulder    Stroke [facility])    Past Surgical History:  Procedure Laterality Date   CARDIAC CATHETERIZATION     COLONOSCOPY WITH PROPOFOL N/A 01/30/2020   Procedure: COLONOSCOPY WITH PROPOFOL;  Surgeon: Carol Ada, MD;  Location: [facility] ENDOSCOPY;  Service: Endoscopy;  Laterality: N/A;   CORONARY ARTERY BYPASS GRAFT     triple   CORONARY ARTERY BYPASS GRAFT  2008   POLYPECTOMY  01/30/2020   Procedure: POLYPECTOMY;  Surgeon: Carol Ada, MD;  Location: [facility] ENDOSCOPY;  Service: Endoscopy;;   TUBAL LIGATION       A IV Location/Drains/Wounds Patient Lines/Drains/Airways Status     Active Line/Drains/Airways     Name Placement date Placement time Site Days   Peripheral IV 06/23/22 Left;Lateral Forearm 06/23/22  1126  Forearm  1   External Urinary Catheter 06/23/22  1247  --  1   Pressure Injury Stage II -  Partial thickness loss of dermis presenting as a shallow open ulcer with a red, pink wound bed without slough. --  --  -- --   Wound / Incision (Open or Dehisced) 12/19/20 Skin tear Foot Left;Posterior multiple small skin tears  12/19/20  0100  Foot  552            Intake/Output Last 24 hours No intake or output data in the 24 hours ending 06/24/22 1726  Labs/Imaging Results for orders placed or performed during the hospital encounter of 06/23/22 (from the past 48 hour(s))  CBC with Differential     Status: Abnormal   Collection Time: 06/23/22 11:18 AM  Result Value Ref Range   WBC 7.4 4.0 - 10.5 K/uL   RBC 5.55 (H) 3.87 - 5.11 MIL/uL   Hemoglobin 12.7 12.0  - 15.0 g/dL   HCT 39.5 36.0 - 46.0 %   MCV 71.2 (L) 80.0 - 100.0 fL   MCH 22.9 (L) 26.0 - 34.0 pg   MCHC 32.2 30.0 - 36.0 g/dL   RDW 17.5 (H) 11.5 - 15.5 %   Platelets 207 150 - 400 K/uL    Comment: REPEATED TO VERIFY   nRBC 0.0 0.0 - 0.2 %   Neutrophils Relative % 71 %   Neutro Abs 5.3 1.7 - 7.7 K/uL   Lymphocytes Relative 22 %   Lymphs Abs 1.6 0.7 - 4.0 K/uL   Monocytes Relative 5 %   Monocytes Absolute 0.3 0.1 - 1.0 K/uL   Eosinophils Relative 1 %   Eosinophils Absolute 0.1 0.0 - 0.5 K/uL   Basophils Relative 1 %   Basophils Absolute 0.1 0.0 - 0.1 K/uL   Immature Granulocytes 0 %   Abs Immature Granulocytes 0.02 0.00 - 0.07 K/uL    Comment: Performed at [facility], [address], [place], [place] [address]  Comprehensive metabolic panel     Status: Abnormal   Collection Time: 06/23/22 11:18 AM  Result Value Ref Range   Sodium 145 135 - 145 mmol/L   Potassium 3.8 3.5 - 5.1 mmol/L   Chloride 110 98 - 111 mmol/L   CO2 21 (L) 22 - 32 mmol/L   Glucose, Bld 81 70 - 99 mg/dL    Comment: Glucose reference range applies only to samples taken after fasting for at least 8 hours.   BUN 17 8 - 23 mg/dL   Creatinine, Ser 1.23 (H) 0.44 - 1.00 mg/dL   Calcium 9.6 8.9 - 10.3 mg/dL   Total Protein 6.3 (L) 6.5 - 8.1 g/dL   Albumin 4.0 3.5 - 5.0 g/dL   AST 24 15 - 41 U/L   ALT 28 0 - 44 U/L   Alkaline Phosphatase 70 38 - 126 U/L   Total Bilirubin 0.7 0.3 - 1.2 mg/dL   GFR, Estimated 49 (L) >60 mL/min    Comment: (NOTE) Calculated using the CKD-EPI Creatinine Equation (2021)    Anion gap 14 5 - 15    Comment: Performed at [place] [facility] [place] [address], [place], [place] [address]  Lipase, blood     Status: None   Collection Time: 06/23/22 11:18 AM  Result Value Ref Range   Lipase 26 11 - 51 U/L    Comment: Performed at [facility] [address] [place], [place] [address]  Troponin I (High Sensitivity)     Status: None   Collection Time: 06/23/22 11:18 AM   Result Value Ref Range   Troponin I (High Sensitivity) 15 <18 ng/L    Comment: (NOTE) Elevated high sensitivity troponin I (hsTnI) values and significant  changes across serial measurements may suggest ACS but many other  chronic and acute conditions are known to elevate hsTnI results.  Refer  to the "Links" section for chest pain algorithms and additional  guidance. Performed at [facility], [place] [address], [place], [place] [address]   Troponin I (High Sensitivity)     Status: None   Collection Time: 06/23/22 11:18 AM  Result Value Ref Range   Troponin I (High Sensitivity) 15 <18 ng/L    Comment: (NOTE) Elevated high sensitivity troponin I (hsTnI) values and significant  changes across serial measurements may suggest ACS but many other  chronic and acute conditions are known to elevate hsTnI results.  Refer to the "Links" section for chest pain algorithms and additional  guidance. Performed at [facility], [place] [address] [place], [place] [address]   HIV Antibody (routine testing w rflx)     Status: None   Collection Time: 06/24/22  3:35 AM  Result Value Ref Range   HIV Screen 4th Generation wRfx Non Reactive Non Reactive    Comment: Performed at [facility], [place] [address], [place], [place] [address]  Comprehensive metabolic panel     Status: Abnormal   Collection Time: 06/24/22  3:35 AM  Result Value Ref Range   Sodium 141 135 - 145 mmol/L   Potassium 3.6 3.5 - 5.1 mmol/L   Chloride 115 (H) 98 - 111 mmol/L   CO2 19 (L) 22 - 32 mmol/L   Glucose, Bld 85 70 - 99 mg/dL    Comment: Glucose reference range applies only to samples taken after fasting for at least 8 hours.   BUN 13 8 - 23 mg/dL   Creatinine, Ser 0.93 0.44 - 1.00 mg/dL   Calcium 8.8 (L) 8.9 - 10.3 mg/dL   Total Protein 5.5 (L) 6.5 - 8.1 g/dL   Albumin 3.3 (L) 3.5 - 5.0 g/dL   AST 37 15 - 41 U/L   ALT 26 0 - 44 U/L   Alkaline Phosphatase 57 38 - 126 U/L   Total Bilirubin 0.2 (L) 0.3 - 1.2  mg/dL   GFR, Estimated >60 >60 mL/min    Comment: (NOTE) Calculated using the CKD-EPI Creatinine Equation (2021)    Anion gap 7 5 - 15    Comment: Performed at [facility], [place] [address], [place], [place] [address]  CBC     Status: Abnormal   Collection Time: 06/24/22  3:35 AM  Result Value Ref Range   WBC 6.3 4.0 - 10.5 K/uL   RBC 5.06 3.87 - 5.11 MIL/uL   Hemoglobin 11.4 (L) 12.0 - 15.0 g/dL   HCT 37.0 36.0 - 46.0 %   MCV 73.1 (L) 80.0 - 100.0 fL   MCH 22.5 (L) 26.0 - 34.0 pg   MCHC 30.8 30.0 - 36.0 g/dL   RDW 17.5 (H) 11.5 - 15.5 %   Platelets 176 150 - 400 K/uL    Comment: REPEATED TO VERIFY   nRBC 0.0 0.0 - 0.2 %    Comment: Performed at [facility], [place] [address] [place], [place] [address]   ECHOCARDIOGRAM COMPLETE  Result Date: 06/23/2022    ECHOCARDIOGRAM REPORT   Patient Name:   MOZELL HABER Date of Exam: 06/23/2022 Medical Rec #:  [id number]         Height:       67.0 in Accession #:    [id number]        Weight:       119.0 lb Date of Birth:  1956-05-25        BSA:  1.622 m Patient Age:    54 years          BP:           125/98 mmHg Patient Gender: F                 HR:           69 bpm. Exam Location:  Inpatient Procedure: 2D Echo, Color Doppler and Cardiac Doppler Indications:    I48.91* Unspecified atrial fibrillation  History:        Patient has prior history of Echocardiogram examinations, most                 recent 12/19/2020. CAD, COPD, Arrythmias:Atrial Fibrillation; Risk                 Factors:Hypertension and Dyslipidemia. Apical Variant HCM.  Sonographer:    Raquel Sarna Senior RDCS Referring Phys: [id number] [facility]  1. Left ventricular ejection fraction, by estimation, is 60 to 65%. The left ventricle has normal function. The left ventricle has no regional wall motion abnormalities. There is severe concentric left ventricular hypertrophy of the apical segment, consistent with apical HCM. Left ventricular diastolic function  could not be evaluated.  2. Right ventricular systolic function moderate to severly reduced. The right ventricular size is mildly enlarged. There is normal pulmonary artery systolic pressure. The estimated right ventricular systolic pressure is 78.2 mmHg.  3. Left atrial size was severely dilated.  4. Right atrial size was moderately dilated.  5. The mitral valve is abnormal. Mild mitral valve regurgitation.  6. The aortic valve is tricuspid. Aortic valve regurgitation is not visualized.  7. Aortic dilatation noted. There is mild dilatation of the ascending aorta, measuring 42 mm.  8. The inferior vena cava is normal in size with <50% respiratory variability, suggesting right atrial pressure of 8 mmHg. Comparison(s): Changes from prior study are noted. 12/19/2020: LVEF 55%, RVSP 59 mmHg, ascending aorta 44 mm. FINDINGS  Left Ventricle: Left ventricular ejection fraction, by estimation, is 60 to 65%. The left ventricle has normal function. The left ventricle has no regional wall motion abnormalities. The left ventricular internal cavity size was normal in size. There is  severe concentric left ventricular hypertrophy of the apical segment. Left ventricular diastolic function could not be evaluated due to atrial fibrillation. Left ventricular diastolic function could not be evaluated. Right Ventricle: The right ventricular size is mildly enlarged. No increase in right ventricular wall thickness. Right ventricular systolic function moderate to severly reduced. There is normal pulmonary artery systolic pressure. The tricuspid regurgitant velocity is 2.38 m/s, and with an assumed right atrial pressure of 8 mmHg, the estimated right ventricular systolic pressure is 42.3 mmHg. Left Atrium: Left atrial size was severely dilated. Right Atrium: Right atrial size was moderately dilated. Pericardium: There is no evidence of pericardial effusion. Mitral Valve: The mitral valve is abnormal. There is mild calcification of the  posterior and anterior mitral valve leaflet(s). Mild mitral valve regurgitation. Tricuspid Valve: The tricuspid valve is grossly normal. Tricuspid valve regurgitation is mild. Aortic Valve: The aortic valve is tricuspid. Aortic valve regurgitation is not visualized. Pulmonic Valve: The pulmonic valve was normal in structure. Pulmonic valve regurgitation is not visualized. Aorta: Aortic dilatation noted. There is mild dilatation of the ascending aorta, measuring 42 mm. Venous: The inferior vena cava is normal in size with less than 50% respiratory variability, suggesting right atrial pressure of 8 mmHg. IAS/Shunts: No atrial level shunt detected by color  flow Doppler.  LEFT VENTRICLE PLAX 2D LVIDd:         4.00 cm LVIDs:         3.10 cm LV PW:         0.90 cm LV IVS:        1.00 cm LVOT diam:     2.40 cm LV SV:         58 LV SV Index:   36 LVOT Area:     4.52 cm  RIGHT VENTRICLE RV S prime:     5.77 cm/s TAPSE (M-mode): 1.1 cm LEFT ATRIUM              Index        RIGHT ATRIUM           Index LA diam:        2.90 cm  1.79 cm/m   RA Area:     23.90 cm LA Vol (A2C):   144.0 ml 88.79 ml/m  RA Volume:   73.40 ml  45.26 ml/m LA Vol (A4C):   118.0 ml 72.76 ml/m LA Biplane Vol: 132.0 ml 81.39 ml/m  AORTIC VALVE LVOT Vmax:   68.90 cm/s LVOT Vmean:  46.200 cm/s LVOT VTI:    0.128 m  AORTA Ao Root diam: 3.40 cm Ao Asc diam:  4.20 cm TRICUSPID VALVE TR Peak grad:   22.7 mmHg TR Vmax:        238.00 cm/s  SHUNTS Systemic VTI:  0.13 m Systemic Diam: 2.40 cm Lyman Bishop MD Electronically signed by Lyman Bishop MD Signature Date/Time: 06/23/2022/2:43:34 PM    Final    CT Cervical Spine Wo Contrast  Result Date: 06/23/2022 CLINICAL DATA:  66 year old female status post fall. EXAM: CT CERVICAL SPINE WITHOUT CONTRAST TECHNIQUE: Multidetector CT imaging of the cervical spine was performed without intravenous contrast. Multiplanar CT image reconstructions were also generated. RADIATION DOSE REDUCTION: This exam was  performed according to the departmental dose-optimization program which includes automated exposure control, adjustment of the mA and/or kV according to patient size and/or use of iterative reconstruction technique. COMPARISON:  Head CT today.  Cervical spine CT 12/18/2020. FINDINGS: Alignment: Stable straightening of cervical lordosis. Cervicothoracic junction alignment is within normal limits. Bilateral posterior element alignment is within normal limits. Skull base and vertebrae: Visualized skull base is intact. No atlanto-occipital dissociation. C1 and C2 appear intact and aligned. No acute osseous abnormality identified. Soft tissues and spinal canal: No prevertebral fluid or swelling. No visible canal hematoma. Extensive calcified carotid atherosclerosis redemonstrated in the neck. Disc levels: Stable. Mild if any associated chronic cervical spinal stenosis. Upper chest: Visible upper thoracic levels appear intact. Centrilobular emphysema in the lung apices with mild respiratory motion. Incidental posterior right superficial intramuscular benign lipoma (no follow-up imaging recommended). IMPRESSION: 1. No acute traumatic injury identified in the cervical spine. 2. Calcified carotid atherosclerosis. Emphysema (ICD10-J43.9). Electronically Signed   By: Genevie Ann M.D.   On: 06/23/2022 12:08   CT Head Wo Contrast  Result Date: 06/23/2022 CLINICAL DATA:  66 year old female status post fall. EXAM: CT HEAD WITHOUT CONTRAST TECHNIQUE: Contiguous axial images were obtained from the base of the skull through the vertex without intravenous contrast. RADIATION DOSE REDUCTION: This exam was performed according to the departmental dose-optimization program which includes automated exposure control, adjustment of the mA and/or kV according to patient size and/or use of iterative reconstruction technique. COMPARISON:  Brain MRI 02/14/2018.  Head CT 12/18/2020. FINDINGS: Brain: Large chronic right PCA territory infarct  with  encephalomalacia and patchy additional encephalomalacia in posterior right MCA, left anterior and posterior MCA territories. These are stable since 12/18/2020. No superimposed midline shift, ventriculomegaly, mass effect, evidence of mass lesion, intracranial hemorrhage or evidence of cortically based acute infarction. Vascular: Extensive Calcified atherosclerosis at the skull base. No suspicious intracranial vascular hyperdensity. Skull: No acute osseous abnormality identified. Sinuses/Orbits: Visualized paranasal sinuses and mastoids are stable and well aerated. Other: No orbit or scalp soft tissue injury identified. IMPRESSION: 1. No acute traumatic injury identified. 2. Stable non contrast CT appearance of the brain since 2022 with advanced chronic ischemic disease. Electronically Signed   By: Genevie Ann M.D.   On: 06/23/2022 12:04   DG Shoulder Left  Result Date: 06/23/2022 CLINICAL DATA:  Fall EXAM: LEFT SHOULDER - 2+ VIEW COMPARISON:  None Available. FINDINGS: There is no evidence of fracture or dislocation. There is no evidence of arthropathy or other focal bone abnormality. Soft tissues are unremarkable. IMPRESSION: No fracture or dislocation of the left shoulder. Joint spaces are preserved. Electronically Signed   By: Delanna Ahmadi M.D.   On: 06/23/2022 11:42   DG Pelvis Portable  Result Date: 06/23/2022 CLINICAL DATA:  Status post fall. EXAM: PORTABLE PELVIS 1-2 VIEWS COMPARISON:  Dec 18, 2020 FINDINGS: There is no evidence of pelvic fracture or diastasis. No pelvic bone lesions are seen. Vascular calcifications noted. IMPRESSION: Negative. Electronically Signed   By: Fidela Salisbury M.D.   On: 06/23/2022 11:26   DG Chest Portable 1 View  Result Date: 06/23/2022 CLINICAL DATA:  Fall EXAM: PORTABLE CHEST 1 VIEW COMPARISON:  12/23/20 CXR FINDINGS: Status post median sternotomy and CABG. Cardiac and mediastinal contours are unchanged from prior exam. Sternotomy wires are intact. Aortic  atherosclerotic calcifications. No focal airspace opacity. No pleural effusion. No pneumothorax. No radiographically apparent displaced rib fractures. IMPRESSION: 1. No focal airspace opacity. 2. No radiographically apparent displaced rib fractures. Electronically Signed   By: Marin Roberts M.D.   On: 06/23/2022 11:26    Pending Labs Unresulted Labs (From admission, onward)     Start     Ordered   06/24/22 1649  Resp Panel by RT-PCR (Flu A&B, Covid) Anterior Nasal Swab  (Tier 2 - Symptomatic Resp Panel by RT-PCR (Flu A&B, Covid))  Once,   R        06/24/22 1648   06/24/22 1649  Respiratory (~20 pathogens) panel by PCR  (Respiratory panel by PCR (~20 pathogens, ~24 hr TAT)  w precautions)  Once,   R        06/24/22 1648            Vitals/Pain Today's Vitals   06/24/22 0834 06/24/22 0945 06/24/22 1015 06/24/22 1643  BP:  117/82 (!) 128/103 (!) 136/104  Pulse:  64 76 70  Resp:  '16 16 16  '$ Temp: 98.4 F (36.9 C)   98 F (36.7 C)  TempSrc: Oral   Oral  SpO2:  100% 100% 100%  Weight:      Height:      PainSc:        Isolation Precautions Droplet precaution  Medications Medications  atorvastatin (LIPITOR) tablet 80 mg (80 mg Oral Given 06/24/22 0922)  amLODipine (NORVASC) tablet 5 mg (5 mg Oral Given 06/24/22 0922)  pantoprazole (PROTONIX) EC tablet 40 mg (40 mg Oral Given 06/24/22 0922)  apixaban (ELIQUIS) tablet 5 mg (5 mg Oral Given 06/24/22 0922)  arformoterol (BROVANA) nebulizer solution 15 mcg (15 mcg Nebulization Given 06/24/22 0921)  And  umeclidinium bromide (INCRUSE ELLIPTA) 62.5 MCG/ACT 1 puff (1 puff Inhalation Not Given 06/23/22 2002)  albuterol (PROVENTIL) (2.5 MG/3ML) 0.083% nebulizer solution 2.5 mg (has no administration in time range)  sodium chloride flush (NS) 0.9 % injection 3 mL (3 mLs Intravenous Given 06/24/22 0922)  acetaminophen (TYLENOL) tablet 650 mg (has no administration in time range)    Or  acetaminophen (TYLENOL) suppository 650 mg (has no  administration in time range)  polyethylene glycol (MIRALAX / GLYCOLAX) packet 17 g (17 g Oral Given 06/23/22 1642)  nicotine (NICODERM CQ - dosed in mg/24 hours) patch 14 mg (14 mg Transdermal Patch Applied 06/24/22 0923)  carvedilol (COREG) tablet 12.5 mg (12.5 mg Oral Given 06/24/22 1645)  mupirocin ointment (BACTROBAN) 2 % (has no administration in time range)  sodium chloride 0.9 % bolus 1,000 mL (0 mLs Intravenous Stopped 06/23/22 1236)  diltiazem (CARDIZEM) 1 mg/mL load via infusion 10 mg (10 mg Intravenous Bolus from Bag 06/23/22 1134)    Mobility walks with device Moderate fall risk   Focused Assessments    R Recommendations: See Admitting Provider Note  Report given to:   Additional Notes: Purewick, brief, urine and bowel incontinence.

## 2022-06-24 NOTE — Discharge Summary (Signed)
Physician Discharge Summary   Patient: Kimberly [name] MRN: [id number] DOB: 11-Sep-1955  Admit date:     06/23/2022  Discharge date: 06/24/22  Discharge Physician: Patrecia Pour   PCP: Kimberly Brine, FNP   Recommendations at discharge:  Increase coreg 6.'25mg'$  BID > 12.'5mg'$  BID. Follow up with PCP in the next 1-2 weeks, consider recheck BMP after AKI which has resolved at time of discharge.   Discharge Diagnoses: Principal Problem:   Atrial fibrillation with RVR ([place]) Active Problems:   Dyslipidemia   Essential hypertension   Atherosclerosis of coronary artery bypass graft(s), unspecified, with other forms of angina pectoris ([place])   History of CVA (cerebrovascular accident)   Coronary artery disease involving native coronary artery of native heart without angina pectoris   Apical variant hypertrophic cardiomyopathy (HCC)   Hemiparesis affecting nondominant 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 ([place])   Chronic gout without tophus   AKI (acute kidney injury) ([place])   Postural dizziness with near syncope  Resolved Problems:   * No resolved hospital problems. [facility] Course: No notes on file  CT head and cervical spine nonacute Troponin 15 > 15 Echo with apical hypertrophy, LVEF ***.   Assessment and Plan: No notes have been filed under this hospital service. Service: Hospitalist     {Tip this will not be part of the note when signed Body mass index is 18.64 kg/m. , ,  Active Pressure Injury/Wound(s)     Pressure Ulcer  Duration          Pressure Injury Stage II -  Partial thickness loss of dermis presenting as a shallow open ulcer with a red, pink wound bed without slough. -- days           (Optional):26781}  {(NOTE) Pain control PDMP Statment (Optional):26782} Consultants: *** Procedures performed: ***  Disposition: {Plan; Disposition:26390} Diet recommendation:  Discharge Diet Orders (From admission, onward)     Start     Ordered   06/24/22  0000  Diet - low sodium heart healthy        06/24/22 0846           {Diet_Plan:26776} DISCHARGE MEDICATION: Allergies as of 06/24/2022       Reactions   Clonidine Derivatives Other (See Comments)   Patch causes scars   Spironolactone Rash        Medication List     TAKE these medications    albuterol 108 (90 Base) MCG/ACT inhaler Commonly known as: VENTOLIN HFA Inhale 2 puffs into the lungs every 6 (six) hours as needed for wheezing or shortness of breath.   alendronate 70 MG tablet Commonly known as: FOSAMAX TAKE 1 TABLET BY MOUTH EVERY 7 DAYS. Take WITH A full GLASS OF WATER ON EMPTY stomach What changed: See the new instructions.   amLODipine 5 MG tablet Commonly known as: NORVASC Take 1 tablet (5 mg total) by mouth daily.   atorvastatin 80 MG tablet Commonly known as: LIPITOR TAKE 1 TABLET BY MOUTH EVERY DAY   carvedilol 12.5 MG tablet Commonly known as: COREG Take 1 tablet (12.5 mg total) by mouth 2 (two) times daily with a meal. What changed:  medication strength how much to take   docusate sodium 100 MG capsule Commonly known as: COLACE Take 1 capsule (100 mg total) by mouth 2 (two) times daily as needed for mild constipation.   Eliquis 5 MG Tabs tablet Generic drug: apixaban TAKE 1 TABLET BY MOUTH 2 TIMES DAILY What changed:  how much to take   FeroSul 325 (65 FE) MG tablet Generic drug: ferrous sulfate TAKE 1 TABLET BY MOUTH EVERY DAY WITH BREAKFAST What changed: See the new instructions.   fluticasone 50 MCG/ACT nasal spray Commonly known as: FLONASE Place 2 sprays into both nostrils daily.   hydrocortisone cream 1 % Apply 1 application topically daily as needed for itching.   mirtazapine 15 MG tablet Commonly known as: REMERON TAKE 1 TABLET BY MOUTH AT BEDTIME   nicotine 14 mg/24hr patch Commonly known as: NICODERM CQ - dosed in mg/24 hours Place 1 patch (14 mg total) onto the skin daily. What changed: Another medication with the  same name was removed. Continue taking this medication, and follow the directions you see here.   pantoprazole 40 MG tablet Commonly known as: PROTONIX TAKE 1 TABLET BY MOUTH EVERY DAY   Stiolto Respimat 2.5-2.5 MCG/ACT Aers Generic drug: Tiotropium Bromide-Olodaterol Inhale 2 puffs into the lungs daily. What changed:  when to take this reasons to take this   Vitamin D (Ergocalciferol) 1.25 MG (50000 UNIT) Caps capsule Commonly known as: DRISDOL Take 1 capsule (50,000 Units total) by mouth 2 (two) times a week.        Follow-up Information     Kimberly Brine, FNP Follow up.   Specialty: General Practice Contact information: [address] [place] [address] [phone]         [name], MD Follow up.   Specialty: Cardiology Why: for atrial fibrillation Contact information: [employer] [place] [address] [phone]                Discharge Exam: Danley Danker Weights   06/23/22 1102  Weight: 54 kg   ***  Condition at discharge: {DC Condition:26389}  The results of significant diagnostics from this hospitalization (including imaging, microbiology, ancillary and laboratory) are listed below for reference.   Imaging Studies: ECHOCARDIOGRAM COMPLETE  Result Date: 06/23/2022    ECHOCARDIOGRAM REPORT   Patient Name:   Kimberly [name] Date of Exam: 06/23/2022 Medical Rec #:  [id number]         Height:       67.0 in Accession #:    [id number]        Weight:       119.0 lb Date of Birth:  [birth year]        BSA:          1.622 m Patient Age:    66 years          BP:           125/98 mmHg Patient Gender: F                 HR:           69 bpm. Exam Location:  Inpatient Procedure: 2D Echo, Color Doppler and Cardiac Doppler Indications:    I48.91* Unspecified atrial fibrillation  History:        Patient has prior history of Echocardiogram examinations, most                 recent 12/19/2020. CAD, COPD, Arrythmias:Atrial Fibrillation;  Risk                 Factors:Hypertension and Dyslipidemia. Apical Variant HCM.  Sonographer:    Raquel Sarna Senior RDCS Referring Phys: [id number] [place]  1. Left ventricular ejection fraction, by estimation, is 60 to 65%. The left ventricle has normal function. The left  ventricle has no regional wall motion abnormalities. There is severe concentric left ventricular hypertrophy of the apical segment, consistent with apical HCM. Left ventricular diastolic function could not be evaluated.  2. Right ventricular systolic function moderate to severly reduced. The right ventricular size is mildly enlarged. There is normal pulmonary artery systolic pressure. The estimated right ventricular systolic pressure is 26.7 mmHg.  3. Left atrial size was severely dilated.  4. Right atrial size was moderately dilated.  5. The mitral valve is abnormal. Mild mitral valve regurgitation.  6. The aortic valve is tricuspid. Aortic valve regurgitation is not visualized.  7. Aortic dilatation noted. There is mild dilatation of the ascending aorta, measuring 42 mm.  8. The inferior vena cava is normal in size with <50% respiratory variability, suggesting right atrial pressure of 8 mmHg. Comparison(s): Changes from prior study are noted. 12/19/2020: LVEF 55%, RVSP 59 mmHg, ascending aorta 44 mm. FINDINGS  Left Ventricle: Left ventricular ejection fraction, by estimation, is 60 to 65%. The left ventricle has normal function. The left ventricle has no regional wall motion abnormalities. The left ventricular internal cavity size was normal in size. There is  severe concentric left ventricular hypertrophy of the apical segment. Left ventricular diastolic function could not be evaluated due to atrial fibrillation. Left ventricular diastolic function could not be evaluated. Right Ventricle: The right ventricular size is mildly enlarged. No increase in right ventricular wall thickness. Right ventricular systolic function moderate to  severly reduced. There is normal pulmonary artery systolic pressure. The tricuspid regurgitant velocity is 2.38 m/s, and with an assumed right atrial pressure of 8 mmHg, the estimated right ventricular systolic pressure is 12.4 mmHg. Left Atrium: Left atrial size was severely dilated. Right Atrium: Right atrial size was moderately dilated. Pericardium: There is no evidence of pericardial effusion. Mitral Valve: The mitral valve is abnormal. There is mild calcification of the posterior and anterior mitral valve leaflet(s). Mild mitral valve regurgitation. Tricuspid Valve: The tricuspid valve is grossly normal. Tricuspid valve regurgitation is mild. Aortic Valve: The aortic valve is tricuspid. Aortic valve regurgitation is not visualized. Pulmonic Valve: The pulmonic valve was normal in structure. Pulmonic valve regurgitation is not visualized. Aorta: Aortic dilatation noted. There is mild dilatation of the ascending aorta, measuring 42 mm. Venous: The inferior vena cava is normal in size with less than 50% respiratory variability, suggesting right atrial pressure of 8 mmHg. IAS/Shunts: No atrial level shunt detected by color flow Doppler.  LEFT VENTRICLE PLAX 2D LVIDd:         4.00 cm LVIDs:         3.10 cm LV PW:         0.90 cm LV IVS:        1.00 cm LVOT diam:     2.40 cm LV SV:         58 LV SV Index:   36 LVOT Area:     4.52 cm  RIGHT VENTRICLE RV S prime:     5.77 cm/s TAPSE (M-mode): 1.1 cm LEFT ATRIUM              Index        RIGHT ATRIUM           Index LA diam:        2.90 cm  1.79 cm/m   RA Area:     23.90 cm LA Vol (A2C):   144.0 ml 88.79 ml/m  RA Volume:   73.40 ml  45.26 ml/m LA  Vol (A4C):   118.0 ml 72.76 ml/m LA Biplane Vol: 132.0 ml 81.39 ml/m  AORTIC VALVE LVOT Vmax:   68.90 cm/s LVOT Vmean:  46.200 cm/s LVOT VTI:    0.128 m  AORTA Ao Root diam: 3.40 cm Ao Asc diam:  4.20 cm TRICUSPID VALVE TR Peak grad:   22.7 mmHg TR Vmax:        238.00 cm/s  SHUNTS Systemic VTI:  0.13 m Systemic Diam:  2.40 cm Lyman Bishop MD Electronically signed by Lyman Bishop MD Signature Date/Time: 06/23/2022/2:43:34 PM    Final    CT Cervical Spine Wo Contrast  Result Date: 06/23/2022 CLINICAL DATA:  66 year old female status post fall. EXAM: CT CERVICAL SPINE WITHOUT CONTRAST TECHNIQUE: Multidetector CT imaging of the cervical spine was performed without intravenous contrast. Multiplanar CT image reconstructions were also generated. RADIATION DOSE REDUCTION: This exam was performed according to the departmental dose-optimization program which includes automated exposure control, adjustment of the mA and/or kV according to patient size and/or use of iterative reconstruction technique. COMPARISON:  Head CT today.  Cervical spine CT 12/18/2020. FINDINGS: Alignment: Stable straightening of cervical lordosis. Cervicothoracic junction alignment is within normal limits. Bilateral posterior element alignment is within normal limits. Skull base and vertebrae: Visualized skull base is intact. No atlanto-occipital dissociation. C1 and C2 appear intact and aligned. No acute osseous abnormality identified. Soft tissues and spinal canal: No prevertebral fluid or swelling. No visible canal hematoma. Extensive calcified carotid atherosclerosis redemonstrated in the neck. Disc levels: Stable. Mild if any associated chronic cervical spinal stenosis. Upper chest: Visible upper thoracic levels appear intact. Centrilobular emphysema in the lung apices with mild respiratory motion. Incidental posterior right superficial intramuscular benign lipoma (no follow-up imaging recommended). IMPRESSION: 1. No acute traumatic injury identified in the cervical spine. 2. Calcified carotid atherosclerosis. Emphysema (ICD10-J43.9). Electronically Signed   By: Genevie Ann M.D.   On: 06/23/2022 12:08   CT Head Wo Contrast  Result Date: 06/23/2022 CLINICAL DATA:  66 year old female status post fall. EXAM: CT HEAD WITHOUT CONTRAST TECHNIQUE: Contiguous  axial images were obtained from the base of the skull through the vertex without intravenous contrast. RADIATION DOSE REDUCTION: This exam was performed according to the departmental dose-optimization program which includes automated exposure control, adjustment of the mA and/or kV according to patient size and/or use of iterative reconstruction technique. COMPARISON:  Brain MRI 02/14/2018.  Head CT 12/18/2020. FINDINGS: Brain: Large chronic right PCA territory infarct with encephalomalacia and patchy additional encephalomalacia in posterior right MCA, left anterior and posterior MCA territories. These are stable since 12/18/2020. No superimposed midline shift, ventriculomegaly, mass effect, evidence of mass lesion, intracranial hemorrhage or evidence of cortically based acute infarction. Vascular: Extensive Calcified atherosclerosis at the skull base. No suspicious intracranial vascular hyperdensity. Skull: No acute osseous abnormality identified. Sinuses/Orbits: Visualized paranasal sinuses and mastoids are stable and well aerated. Other: No orbit or scalp soft tissue injury identified. IMPRESSION: 1. No acute traumatic injury identified. 2. Stable non contrast CT appearance of the brain since 2022 with advanced chronic ischemic disease. Electronically Signed   By: Genevie Ann M.D.   On: 06/23/2022 12:04   DG Shoulder Left  Result Date: 06/23/2022 CLINICAL DATA:  Fall EXAM: LEFT SHOULDER - 2+ VIEW COMPARISON:  None Available. FINDINGS: There is no evidence of fracture or dislocation. There is no evidence of arthropathy or other focal bone abnormality. Soft tissues are unremarkable. IMPRESSION: No fracture or dislocation of the left shoulder. Joint spaces are preserved. Electronically Signed  By: Delanna Ahmadi M.D.   On: 06/23/2022 11:42   DG Pelvis Portable  Result Date: 06/23/2022 CLINICAL DATA:  Status post fall. EXAM: PORTABLE PELVIS 1-2 VIEWS COMPARISON:  Dec 18, 2020 FINDINGS: There is no evidence of  pelvic fracture or diastasis. No pelvic bone lesions are seen. Vascular calcifications noted. IMPRESSION: Negative. Electronically Signed   By: Fidela Salisbury M.D.   On: 06/23/2022 11:26   DG Chest Portable 1 View  Result Date: 06/23/2022 CLINICAL DATA:  Fall EXAM: PORTABLE CHEST 1 VIEW COMPARISON:  12/23/20 CXR FINDINGS: Status post median sternotomy and CABG. Cardiac and mediastinal contours are unchanged from prior exam. Sternotomy wires are intact. Aortic atherosclerotic calcifications. No focal airspace opacity. No pleural effusion. No pneumothorax. No radiographically apparent displaced rib fractures. IMPRESSION: 1. No focal airspace opacity. 2. No radiographically apparent displaced rib fractures. Electronically Signed   By: Marin Roberts M.D.   On: 06/23/2022 11:26    Microbiology: Results for orders placed or performed during the hospital encounter of 12/18/20  Urine culture     Status: None   Collection Time: 12/18/20  6:07 PM   Specimen: In/Out Cath Urine  Result Value Ref Range Status   Specimen Description IN/OUT CATH URINE  Final   Special Requests NONE  Final   Culture   Final    NO GROWTH Performed at [facility], [place] [address], [place], [place] [address]    Report Status 12/19/2020 FINAL  Final  Resp Panel by RT-PCR (Flu A&B, Covid) Nasopharyngeal Swab     Status: None   Collection Time: 12/18/20  7:59 PM   Specimen: Nasopharyngeal Swab; Nasopharyngeal(NP) swabs in vial transport medium  Result Value Ref Range Status   SARS Coronavirus 2 by RT PCR NEGATIVE NEGATIVE Final    Comment: (NOTE) SARS-CoV-2 target nucleic acids are NOT DETECTED.  The SARS-CoV-2 RNA is generally detectable in upper respiratory specimens during the acute phase of infection. The lowest concentration of SARS-CoV-2 viral copies this assay can detect is 138 copies/mL. A negative result does not preclude SARS-Cov-2 infection and should not be used as the sole basis for treatment  or other patient management decisions. A negative result may occur with  improper specimen collection/handling, submission of specimen other than nasopharyngeal swab, presence of viral mutation(s) within the areas targeted by this assay, and inadequate number of viral copies(<138 copies/mL). A negative result must be combined with clinical observations, patient history, and epidemiological information. The expected result is Negative.  Fact Sheet for Patients:  EntrepreneurPulse.com.au  Fact Sheet for Healthcare Providers:  IncredibleEmployment.be  This test is no t yet approved or cleared by the [place] FDA and  has been authorized for detection and/or diagnosis of SARS-CoV-2 by FDA under an Emergency Use Authorization (EUA). This EUA will remain  in effect (meaning this test can be used) for the duration of the COVID-19 declaration under Section 564(b)(1) of the Act, 21 U.S.C.section 360bbb-3(b)(1), unless the authorization is terminated  or revoked sooner.       Influenza A by PCR NEGATIVE NEGATIVE Final   Influenza B by PCR NEGATIVE NEGATIVE Final    Comment: (NOTE) The Xpert Xpress SARS-CoV-2/FLU/RSV plus assay is intended as an aid in the diagnosis of influenza from Nasopharyngeal swab specimens and should not be used as a sole basis for treatment. Nasal washings and aspirates are unacceptable for Xpert Xpress SARS-CoV-2/FLU/RSV testing.  Fact Sheet for Patients: EntrepreneurPulse.com.au  Fact Sheet for Healthcare Providers: IncredibleEmployment.be  This test  is not yet approved or cleared by the [place] and has been authorized for detection and/or diagnosis of SARS-CoV-2 by FDA under an Emergency Use Authorization (EUA). This EUA will remain in effect (meaning this test can be used) for the duration of the COVID-19 declaration under Section 564(b)(1) of the Act, 21 U.S.C. section  360bbb-3(b)(1), unless the authorization is terminated or revoked.  Performed at [facility], [place] [address], [place], [place] [address]   Blood Culture (routine x 2)     Status: None   Collection Time: 12/19/20  3:28 AM   Specimen: BLOOD RIGHT HAND  Result Value Ref Range Status   Specimen Description BLOOD RIGHT HAND  Final   Special Requests   Final    BOTTLES DRAWN AEROBIC ONLY Blood Culture results may not be optimal due to an inadequate volume of blood received in culture bottles   Culture   Final    NO GROWTH 5 DAYS Performed at [facility], [place] [address] [place], [place] [address]    Report Status 12/24/2020 FINAL  Final  Blood Culture (routine x 2)     Status: None   Collection Time: 12/19/20  3:28 AM   Specimen: BLOOD  Result Value Ref Range Status   Specimen Description BLOOD SITE NOT SPECIFIED  Final   Special Requests   Final    BOTTLES DRAWN AEROBIC ONLY Blood Culture results may not be optimal due to an inadequate volume of blood received in culture bottles   Culture   Final    NO GROWTH 5 DAYS Performed at [facility], [place] [address], [place],  [address]    Report Status 12/24/2020 FINAL  Final    Labs: CBC: Recent Labs  Lab 06/23/22 1118 06/24/22 0335  WBC 7.4 6.3  NEUTROABS 5.3  --   HGB 12.7 11.4*  HCT 39.5 37.0  MCV 71.2* 73.1*  PLT 207 035   Basic Metabolic Panel: Recent Labs  Lab 06/23/22 1118 06/24/22 0335  NA 145 141  K 3.8 3.6  CL 110 115*  CO2 21* 19*  GLUCOSE 81 85  BUN 17 13  CREATININE 1.23* 0.93  CALCIUM 9.6 8.8*   Liver Function Tests: Recent Labs  Lab 06/23/22 1118 06/24/22 0335  AST 24 37  ALT 28 26  ALKPHOS 70 57  BILITOT 0.7 0.2*  PROT 6.3* 5.5*  ALBUMIN 4.0 3.3*   CBG: No results for input(s): "GLUCAP" in the last 168 hours.  Discharge time spent: {LESS THAN/GREATER WSFK:81275} 30 minutes.  Signed: Patrecia Pour, MD [facility] 06/24/2022

## 2022-06-24 NOTE — ED Notes (Signed)
Pt had a BM, pt cleaned and changed into a clean brief.

## 2022-06-24 NOTE — [place] FL2 (Signed)
[place] MEDICAID FL2 LEVEL OF CARE SCREENING TOOL     IDENTIFICATION  Patient Name: Kimberly [name] Birthdate: 19-Nov-1955 Sex: female Admission Date (Current Location): 06/23/2022  [facility] [place] Number:  Herbalist and Address:  [facility], [place] [address], [place], [place] [address]      Provider Number: [id number]  Attending Physician Name and Address:  Patrecia Pour, MD  Relative Name and Phone Number:       Current Level of Care: SNF Recommended Level of Care: [place] Prior Approval Number:    Date Approved/Denied:   PASRR Number: [id number]  Discharge Plan: SNF    Current Diagnoses: Patient Active Problem List   Diagnosis Date Noted   Postural dizziness with near syncope 06/23/2022   COPD (chronic obstructive pulmonary disease) ([place]) 02/09/2022   Osteoporosis 02/07/2022   Pulmonary nodule 01/25/2021   Shortness of breath 01/25/2021   Sepsis ([place]) 12/18/2020   Family history of malignant neoplasm of gastrointestinal tract 12/07/2020   Personal history of colonic polyps 12/07/2020   Secondary hypercoagulable state ([place]) 10/14/2020   Right sided abdominal pain    Abdominal wall pain    Educated about COVID-19 virus infection 11/16/2019   Urinary frequency 04/14/2019   Cough 04/14/2019   AKI (acute kidney injury) ([place]) 11/26/2018   Diarrhea 11/25/2018   Dehydration, mild 11/25/2018   Hemiparesis affecting nondominant 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 ([place]) 07/31/2018   Muscle ache 07/31/2018   Prediabetes 07/31/2018   Chronic gout without tophus 07/31/2018   Vision loss 07/31/2018   Malnutrition of moderate degree 02/15/2018   Carotid occlusion, bilateral    Atrial fibrillation with RVR ([place]) 02/13/2018   Positive D dimer 02/13/2018   Back pain 02/13/2018   History of CVA (cerebrovascular accident) 02/13/2018   Paroxysmal atrial fibrillation ([place]) 02/13/2018   Coronary artery  disease involving native coronary artery of native heart without angina pectoris 02/13/2018   Apical variant hypertrophic cardiomyopathy ([place]) 02/13/2018   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H[facility]) 02/13/2018   Aortic atherosclerosis ([place]) 02/13/2018   TOBACCO USE, QUIT 05/03/2009   Atherosclerosis of coronary artery bypass graft(s), unspecified, with other forms of angina pectoris ([place])    ROTATOR CUFF SYNDROME, LEFT 02/12/2009   Shoulder pain, left 11/03/2008   HAMMER TOE, ACQUIRED 02/11/2008   INSECT BITE, LEG 02/11/2008   Dyslipidemia 11/05/2007   UNSPECIFIED DISORDER TEETH&SUPPORTING STRUCTURES 11/05/2007   BICEPS TENDINITIS, RIGHT 04/24/2007   Essential hypertension 04/23/2007   MICROCYTOSIS 04/23/2007    Orientation RESPIRATION BLADDER Height & Weight     Self, Time, Situation, Place  Normal Continent, External catheter Weight: 119 lb (54 kg) Height:  '5\' 7"'$  (170.2 cm)  BEHAVIORAL SYMPTOMS/MOOD NEUROLOGICAL BOWEL NUTRITION STATUS      Continent Diet  AMBULATORY STATUS COMMUNICATION OF NEEDS Skin   Extensive Assist Verbally Normal                       Personal Care Assistance Level of Assistance  Bathing, Feeding, Dressing Bathing Assistance: Limited assistance Feeding assistance: Independent Dressing Assistance: Limited assistance     Functional Limitations Info  Hearing, Sight, Speech Sight Info: Adequate Hearing Info: Adequate Speech Info: Adequate    SPECIAL CARE FACTORS FREQUENCY  PT (By licensed PT), OT (By licensed OT)     PT Frequency: 5x weekly OT Frequency: 5x weekly            Contractures Contractures Info: Not present  Additional Factors Info  Code Status, Allergies Code Status Info: Full Code Allergies Info: Clonidine Derivatives, Spironolactone           Current Medications (06/24/2022):  This is the current hospital active medication list Current Facility-Administered Medications  Medication Dose Route Frequency Provider Last Rate  Last Admin   acetaminophen (TYLENOL) tablet 650 mg  650 mg Oral Q6H PRN Marcelyn Bruins, MD       Or   acetaminophen (TYLENOL) suppository 650 mg  650 mg Rectal Q6H PRN Marcelyn Bruins, MD       albuterol (PROVENTIL) (2.5 MG/3ML) 0.083% nebulizer solution 2.5 mg  2.5 mg Inhalation Q6H PRN Marcelyn Bruins, MD       amLODipine (NORVASC) tablet 5 mg  5 mg Oral Daily Marcelyn Bruins, MD   5 mg at 06/24/22 3893   apixaban (ELIQUIS) tablet 5 mg  5 mg Oral BID Marcelyn Bruins, MD   5 mg at 06/24/22 7342   arformoterol (BROVANA) nebulizer solution 15 mcg  15 mcg Nebulization BID Marcelyn Bruins, MD   15 mcg at 06/24/22 8768   And   umeclidinium bromide (INCRUSE ELLIPTA) 62.5 MCG/ACT 1 puff  1 puff Inhalation Daily Marcelyn Bruins, MD       atorvastatin (LIPITOR) tablet 80 mg  80 mg Oral Daily Marcelyn Bruins, MD   80 mg at 06/24/22 1157   carvedilol (COREG) tablet 12.5 mg  12.5 mg Oral BID WC Vance Gather B, MD   12.5 mg at 06/24/22 0807   diltiazem (CARDIZEM) 125 mg in dextrose 5% 125 mL (1 mg/mL) infusion  5-15 mg/hr Intravenous Continuous Marcelyn Bruins, MD   Paused at 06/23/22 1232   nicotine (NICODERM CQ - dosed in mg/24 hours) patch 14 mg  14 mg Transdermal Daily Marcelyn Bruins, MD   14 mg at 06/24/22 0923   pantoprazole (PROTONIX) EC tablet 40 mg  40 mg Oral Daily Marcelyn Bruins, MD   40 mg at 06/24/22 2620   polyethylene glycol (MIRALAX / GLYCOLAX) packet 17 g  17 g Oral Daily PRN Marcelyn Bruins, MD   17 g at 06/23/22 1642   sodium chloride flush (NS) 0.9 % injection 3 mL  3 mL Intravenous Q12H Marcelyn Bruins, MD   3 mL at 06/24/22 3559   Current Outpatient Medications  Medication Sig Dispense Refill   albuterol (VENTOLIN HFA) 108 (90 Base) MCG/ACT inhaler Inhale 2 puffs into the lungs every 6 (six) hours as needed for wheezing or shortness of breath. 8 g 6   alendronate (FOSAMAX) 70 MG tablet TAKE 1 TABLET BY MOUTH EVERY 7 DAYS. Take WITH  A full GLASS OF WATER ON EMPTY stomach (Patient taking differently: Take 70 mg by mouth once a week.) 12 tablet 1   amLODipine (NORVASC) 5 MG tablet Take 1 tablet (5 mg total) by mouth daily. 90 tablet 3   atorvastatin (LIPITOR) 80 MG tablet TAKE 1 TABLET BY MOUTH EVERY DAY (Patient taking differently: Take 80 mg by mouth daily.) 90 tablet 2   docusate sodium (COLACE) 100 MG capsule Take 1 capsule (100 mg total) by mouth 2 (two) times daily as needed for mild constipation. 20 capsule 0   ELIQUIS 5 MG TABS tablet TAKE 1 TABLET BY MOUTH 2 TIMES DAILY (Patient taking differently: Take 5 mg by mouth 2 (two) times daily.) 180 tablet 3   FEROSUL 325 (65 Fe) MG tablet TAKE 1 TABLET BY MOUTH EVERY  DAY WITH BREAKFAST (Patient taking differently: Take 325 mg by mouth daily with breakfast.) 30 tablet 8   fluticasone (FLONASE) 50 MCG/ACT nasal spray Place 2 sprays into both nostrils daily. 9.9 mL 0   hydrocortisone cream 1 % Apply 1 application topically daily as needed for itching.     mirtazapine (REMERON) 15 MG tablet TAKE 1 TABLET BY MOUTH AT BEDTIME (Patient taking differently: Take 15 mg by mouth at bedtime.) 30 tablet 2   nicotine (NICODERM CQ - DOSED IN MG/24 HOURS) 14 mg/24hr patch Place 1 patch (14 mg total) onto the skin daily. 28 patch 0   pantoprazole (PROTONIX) 40 MG tablet TAKE 1 TABLET BY MOUTH EVERY DAY (Patient taking differently: Take 40 mg by mouth daily.) 30 tablet 2   Tiotropium Bromide-Olodaterol (STIOLTO RESPIMAT) 2.5-2.5 MCG/ACT AERS Inhale 2 puffs into the lungs daily. (Patient taking differently: Inhale 2 puffs into the lungs daily as needed (shortness of breath).) 4 g 0   Vitamin D, Ergocalciferol, (DRISDOL) 1.25 MG (50000 UNIT) CAPS capsule Take 1 capsule (50,000 Units total) by mouth 2 (two) times a week. 24 capsule 1   carvedilol (COREG) 12.5 MG tablet Take 1 tablet (12.5 mg total) by mouth 2 (two) times daily with a meal. 60 tablet 0     Discharge Medications: Please see  discharge summary for a list of discharge medications.  Relevant Imaging Results:  Relevant Lab Results:   Additional Information SSN: [id number]  Archie Endo, LCSW

## 2022-06-24 NOTE — Care Management Obs Status (Signed)
[facility] NOTIFICATION   Patient Details  Name: NAKYAH ERDMANN MRN: [id number] Date of Birth: [birth year]   Medicare Observation Status Notification Given:  Yes   Permission to sign verbal consent Verdell Carmine, RN 06/24/2022, 10:39 AM

## 2022-06-24 NOTE — ED Notes (Signed)
ED TO INPATIENT HANDOFF REPORT  ED Nurse Name and Phone #: Andee Poles [phone]  S Name/Age/Gender Kimberly [name] 66 y.o. female Room/Bed: 002C/002C  Code Status   Code Status: Full Code  Home/SNF/Other Home Patient oriented to: self, place, time, and situation Is this baseline? Yes   Triage Complete: Triage complete  Chief Complaint Atrial fibrillation with RVR ([place]) [I48.91]  Triage Note Patient presents to ed via [facility] states she was sitting in her wheelchair and was going to walk to  the living room and upon standing became dizzy and fell face forward, abrasion to right eyebrow, c/o left arm pain , left hip , right posterior shoulder. C/o weakness to  left side from previous stroke. Patient is alert confused at baseline.    Allergies Allergies  Allergen Reactions   Clonidine Derivatives Other (See Comments)    Patch causes scars   Spironolactone Rash    Level of Care/Admitting Diagnosis ED Disposition     ED Disposition  Admit   Condition  --   [name]: [place] [[id number]]  Level of Care: Progressive [102]  Admit to Progressive based on following criteria: CARDIOVASCULAR & THORACIC of moderate stability with acute coronary syndrome symptoms/low risk myocardial infarction/hypertensive urgency/arrhythmias/heart failure potentially compromising stability and stable post cardiovascular intervention patients.  May place patient in observation at [facility] or [employer] [facility] if equivalent level of care is available:: No  Covid Evaluation: Asymptomatic - no recent exposure (last 10 days) testing not required  Diagnosis: Atrial fibrillation with RVR [facility]) [[id number]]  Admitting Physician: Marcelyn Bruins [[id number]]  Attending Physician: Marcelyn Bruins [[id number]]          B Medical/Surgery History Past Medical History:  Diagnosis Date   Bicipital tenosynovitis    Cerebral artery occlusion with cerebral infarction ([place])  12/05/2007   Qualifier: Diagnosis of   By: Radene Ou MD, [place]       Coronary artery disease 05/2007   [id number]    Fall 02/13/2018   Family history of ischemic heart disease    Gout    Hammer toe    Hip, thigh, leg, and ankle, insect bite, nonvenomous, without mention of infection(916.4)    Hyperlipidemia    Hypertension    Osteoporosis 02/07/2022   Other abnormality of red blood cells    Rotator cuff syndrome of left shoulder    Stroke [facility])    Past Surgical History:  Procedure Laterality Date   CARDIAC CATHETERIZATION     COLONOSCOPY WITH PROPOFOL N/A 01/30/2020   Procedure: COLONOSCOPY WITH PROPOFOL;  Surgeon: Carol Ada, MD;  Location: [facility] ENDOSCOPY;  Service: Endoscopy;  Laterality: N/A;   CORONARY ARTERY BYPASS GRAFT     triple   CORONARY ARTERY BYPASS GRAFT  2008   POLYPECTOMY  01/30/2020   Procedure: POLYPECTOMY;  Surgeon: Carol Ada, MD;  Location: [facility] ENDOSCOPY;  Service: Endoscopy;;   TUBAL LIGATION       A IV Location/Drains/Wounds Patient Lines/Drains/Airways Status     Active Line/Drains/Airways     Name Placement date Placement time Site Days   Peripheral IV 06/23/22 Left;Lateral Forearm 06/23/22  1126  Forearm  1   External Urinary Catheter 06/23/22  1247  --  1   Pressure Injury Stage II -  Partial thickness loss of dermis presenting as a shallow open ulcer with a red, pink wound bed without slough. --  --  -- --   Wound / Incision (Open or Dehisced) 12/19/20 Skin tear Foot  Left;Posterior multiple small skin tears 12/19/20  0100  Foot  552            Intake/Output Last 24 hours No intake or output data in the 24 hours ending 06/24/22 1154  Labs/Imaging Results for orders placed or performed during the hospital encounter of 06/23/22 (from the past 48 hour(s))  CBC with Differential     Status: Abnormal   Collection Time: 06/23/22 11:18 AM  Result Value Ref Range   WBC 7.4 4.0 - 10.5 K/uL   RBC 5.55 (H) 3.87 - 5.11 MIL/uL   Hemoglobin 12.7 12.0  - 15.0 g/dL   HCT 39.5 36.0 - 46.0 %   MCV 71.2 (L) 80.0 - 100.0 fL   MCH 22.9 (L) 26.0 - 34.0 pg   MCHC 32.2 30.0 - 36.0 g/dL   RDW 17.5 (H) 11.5 - 15.5 %   Platelets 207 150 - 400 K/uL    Comment: REPEATED TO VERIFY   nRBC 0.0 0.0 - 0.2 %   Neutrophils Relative % 71 %   Neutro Abs 5.3 1.7 - 7.7 K/uL   Lymphocytes Relative 22 %   Lymphs Abs 1.6 0.7 - 4.0 K/uL   Monocytes Relative 5 %   Monocytes Absolute 0.3 0.1 - 1.0 K/uL   Eosinophils Relative 1 %   Eosinophils Absolute 0.1 0.0 - 0.5 K/uL   Basophils Relative 1 %   Basophils Absolute 0.1 0.0 - 0.1 K/uL   Immature Granulocytes 0 %   Abs Immature Granulocytes 0.02 0.00 - 0.07 K/uL    Comment: Performed at [facility], [address] [place], [place] [address]  Comprehensive metabolic panel     Status: Abnormal   Collection Time: 06/23/22 11:18 AM  Result Value Ref Range   Sodium 145 135 - 145 mmol/L   Potassium 3.8 3.5 - 5.1 mmol/L   Chloride 110 98 - 111 mmol/L   CO2 21 (L) 22 - 32 mmol/L   Glucose, Bld 81 70 - 99 mg/dL    Comment: Glucose reference range applies only to samples taken after fasting for at least 8 hours.   BUN 17 8 - 23 mg/dL   Creatinine, Ser 1.23 (H) 0.44 - 1.00 mg/dL   Calcium 9.6 8.9 - 10.3 mg/dL   Total Protein 6.3 (L) 6.5 - 8.1 g/dL   Albumin 4.0 3.5 - 5.0 g/dL   AST 24 15 - 41 U/L   ALT 28 0 - 44 U/L   Alkaline Phosphatase 70 38 - 126 U/L   Total Bilirubin 0.7 0.3 - 1.2 mg/dL   GFR, Estimated 49 (L) >60 mL/min    Comment: (NOTE) Calculated using the CKD-EPI Creatinine Equation (2021)    Anion gap 14 5 - 15    Comment: Performed at [facility] [address], [place], [place] [address]  Lipase, blood     Status: None   Collection Time: 06/23/22 11:18 AM  Result Value Ref Range   Lipase 26 11 - 51 U/L    Comment: Performed at [facility] [address], [place], [place] [address]  Troponin I (High Sensitivity)     Status: None   Collection Time: 06/23/22 11:18 AM   Result Value Ref Range   Troponin I (High Sensitivity) 15 <18 ng/L    Comment: (NOTE) Elevated high sensitivity troponin I (hsTnI) values and significant  changes across serial measurements may suggest ACS but many other  chronic and acute conditions are known to  elevate hsTnI results.  Refer to the "Links" section for chest pain algorithms and additional  guidance. Performed at [facility], [place] [address] [place], [place] [address]   Troponin I (High Sensitivity)     Status: None   Collection Time: 06/23/22 11:18 AM  Result Value Ref Range   Troponin I (High Sensitivity) 15 <18 ng/L    Comment: (NOTE) Elevated high sensitivity troponin I (hsTnI) values and significant  changes across serial measurements may suggest ACS but many other  chronic and acute conditions are known to elevate hsTnI results.  Refer to the "Links" section for chest pain algorithms and additional  guidance. Performed at [facility], [place] [address], [place], [place] [address]   HIV Antibody (routine testing w rflx)     Status: None   Collection Time: 06/24/22  3:35 AM  Result Value Ref Range   HIV Screen 4th Generation wRfx Non Reactive Non Reactive    Comment: Performed at [facility], [place] [address] [place], [place] [address]  Comprehensive metabolic panel     Status: Abnormal   Collection Time: 06/24/22  3:35 AM  Result Value Ref Range   Sodium 141 135 - 145 mmol/L   Potassium 3.6 3.5 - 5.1 mmol/L   Chloride 115 (H) 98 - 111 mmol/L   CO2 19 (L) 22 - 32 mmol/L   Glucose, Bld 85 70 - 99 mg/dL    Comment: Glucose reference range applies only to samples taken after fasting for at least 8 hours.   BUN 13 8 - 23 mg/dL   Creatinine, Ser 0.93 0.44 - 1.00 mg/dL   Calcium 8.8 (L) 8.9 - 10.3 mg/dL   Total Protein 5.5 (L) 6.5 - 8.1 g/dL   Albumin 3.3 (L) 3.5 - 5.0 g/dL   AST 37 15 - 41 U/L   ALT 26 0 - 44 U/L   Alkaline Phosphatase 57 38 - 126 U/L   Total Bilirubin 0.2 (L) 0.3 - 1.2  mg/dL   GFR, Estimated >60 >60 mL/min    Comment: (NOTE) Calculated using the CKD-EPI Creatinine Equation (2021)    Anion gap 7 5 - 15    Comment: Performed at [facility], [place] [address] [place], [place] [address]  CBC     Status: Abnormal   Collection Time: 06/24/22  3:35 AM  Result Value Ref Range   WBC 6.3 4.0 - 10.5 K/uL   RBC 5.06 3.87 - 5.11 MIL/uL   Hemoglobin 11.4 (L) 12.0 - 15.0 g/dL   HCT 37.0 36.0 - 46.0 %   MCV 73.1 (L) 80.0 - 100.0 fL   MCH 22.5 (L) 26.0 - 34.0 pg   MCHC 30.8 30.0 - 36.0 g/dL   RDW 17.5 (H) 11.5 - 15.5 %   Platelets 176 150 - 400 K/uL    Comment: REPEATED TO VERIFY   nRBC 0.0 0.0 - 0.2 %    Comment: Performed at [facility], [place] [address] [place], [place] [address]   ECHOCARDIOGRAM COMPLETE  Result Date: 06/23/2022    ECHOCARDIOGRAM REPORT   Patient Name:   JATZIRY WECHTER Date of Exam: 06/23/2022 Medical Rec #:  [id number]         Height:       67.0 in Accession #:    [id number]        Weight:       119.0 lb Date of Birth:  07-Jul-1956  BSA:          1.622 m Patient Age:    44 years          BP:           125/98 mmHg Patient Gender: F                 HR:           69 bpm. Exam Location:  Inpatient Procedure: 2D Echo, Color Doppler and Cardiac Doppler Indications:    I48.91* Unspecified atrial fibrillation  History:        Patient has prior history of Echocardiogram examinations, most                 recent 12/19/2020. CAD, COPD, Arrythmias:Atrial Fibrillation; Risk                 Factors:Hypertension and Dyslipidemia. Apical Variant HCM.  Sonographer:    Raquel Sarna Senior RDCS Referring Phys: [id number] [place]  1. Left ventricular ejection fraction, by estimation, is 60 to 65%. The left ventricle has normal function. The left ventricle has no regional wall motion abnormalities. There is severe concentric left ventricular hypertrophy of the apical segment, consistent with apical HCM. Left ventricular diastolic function  could not be evaluated.  2. Right ventricular systolic function moderate to severly reduced. The right ventricular size is mildly enlarged. There is normal pulmonary artery systolic pressure. The estimated right ventricular systolic pressure is 46.9 mmHg.  3. Left atrial size was severely dilated.  4. Right atrial size was moderately dilated.  5. The mitral valve is abnormal. Mild mitral valve regurgitation.  6. The aortic valve is tricuspid. Aortic valve regurgitation is not visualized.  7. Aortic dilatation noted. There is mild dilatation of the ascending aorta, measuring 42 mm.  8. The inferior vena cava is normal in size with <50% respiratory variability, suggesting right atrial pressure of 8 mmHg. Comparison(s): Changes from prior study are noted. 12/19/2020: LVEF 55%, RVSP 59 mmHg, ascending aorta 44 mm. FINDINGS  Left Ventricle: Left ventricular ejection fraction, by estimation, is 60 to 65%. The left ventricle has normal function. The left ventricle has no regional wall motion abnormalities. The left ventricular internal cavity size was normal in size. There is  severe concentric left ventricular hypertrophy of the apical segment. Left ventricular diastolic function could not be evaluated due to atrial fibrillation. Left ventricular diastolic function could not be evaluated. Right Ventricle: The right ventricular size is mildly enlarged. No increase in right ventricular wall thickness. Right ventricular systolic function moderate to severly reduced. There is normal pulmonary artery systolic pressure. The tricuspid regurgitant velocity is 2.38 m/s, and with an assumed right atrial pressure of 8 mmHg, the estimated right ventricular systolic pressure is 62.9 mmHg. Left Atrium: Left atrial size was severely dilated. Right Atrium: Right atrial size was moderately dilated. Pericardium: There is no evidence of pericardial effusion. Mitral Valve: The mitral valve is abnormal. There is mild calcification of the  posterior and anterior mitral valve leaflet(s). Mild mitral valve regurgitation. Tricuspid Valve: The tricuspid valve is grossly normal. Tricuspid valve regurgitation is mild. Aortic Valve: The aortic valve is tricuspid. Aortic valve regurgitation is not visualized. Pulmonic Valve: The pulmonic valve was normal in structure. Pulmonic valve regurgitation is not visualized. Aorta: Aortic dilatation noted. There is mild dilatation of the ascending aorta, measuring 42 mm. Venous: The inferior vena cava is normal in size with less than 50% respiratory variability, suggesting right atrial pressure of  8 mmHg. IAS/Shunts: No atrial level shunt detected by color flow Doppler.  LEFT VENTRICLE PLAX 2D LVIDd:         4.00 cm LVIDs:         3.10 cm LV PW:         0.90 cm LV IVS:        1.00 cm LVOT diam:     2.40 cm LV SV:         58 LV SV Index:   36 LVOT Area:     4.52 cm  RIGHT VENTRICLE RV S prime:     5.77 cm/s TAPSE (M-mode): 1.1 cm LEFT ATRIUM              Index        RIGHT ATRIUM           Index LA diam:        2.90 cm  1.79 cm/m   RA Area:     23.90 cm LA Vol (A2C):   144.0 ml 88.79 ml/m  RA Volume:   73.40 ml  45.26 ml/m LA Vol (A4C):   118.0 ml 72.76 ml/m LA Biplane Vol: 132.0 ml 81.39 ml/m  AORTIC VALVE LVOT Vmax:   68.90 cm/s LVOT Vmean:  46.200 cm/s LVOT VTI:    0.128 m  AORTA Ao Root diam: 3.40 cm Ao Asc diam:  4.20 cm TRICUSPID VALVE TR Peak grad:   22.7 mmHg TR Vmax:        238.00 cm/s  SHUNTS Systemic VTI:  0.13 m Systemic Diam: 2.40 cm Lyman Bishop MD Electronically signed by Lyman Bishop MD Signature Date/Time: 06/23/2022/2:43:34 PM    Final    CT Cervical Spine Wo Contrast  Result Date: 06/23/2022 CLINICAL DATA:  66 year old female status post fall. EXAM: CT CERVICAL SPINE WITHOUT CONTRAST TECHNIQUE: Multidetector CT imaging of the cervical spine was performed without intravenous contrast. Multiplanar CT image reconstructions were also generated. RADIATION DOSE REDUCTION: This exam was  performed according to the departmental dose-optimization program which includes automated exposure control, adjustment of the mA and/or kV according to patient size and/or use of iterative reconstruction technique. COMPARISON:  Head CT today.  Cervical spine CT 12/18/2020. FINDINGS: Alignment: Stable straightening of cervical lordosis. Cervicothoracic junction alignment is within normal limits. Bilateral posterior element alignment is within normal limits. Skull base and vertebrae: Visualized skull base is intact. No atlanto-occipital dissociation. C1 and C2 appear intact and aligned. No acute osseous abnormality identified. Soft tissues and spinal canal: No prevertebral fluid or swelling. No visible canal hematoma. Extensive calcified carotid atherosclerosis redemonstrated in the neck. Disc levels: Stable. Mild if any associated chronic cervical spinal stenosis. Upper chest: Visible upper thoracic levels appear intact. Centrilobular emphysema in the lung apices with mild respiratory motion. Incidental posterior right superficial intramuscular benign lipoma (no follow-up imaging recommended). IMPRESSION: 1. No acute traumatic injury identified in the cervical spine. 2. Calcified carotid atherosclerosis. Emphysema (ICD10-J43.9). Electronically Signed   By: Genevie Ann M.D.   On: 06/23/2022 12:08   CT Head Wo Contrast  Result Date: 06/23/2022 CLINICAL DATA:  66 year old female status post fall. EXAM: CT HEAD WITHOUT CONTRAST TECHNIQUE: Contiguous axial images were obtained from the base of the skull through the vertex without intravenous contrast. RADIATION DOSE REDUCTION: This exam was performed according to the departmental dose-optimization program which includes automated exposure control, adjustment of the mA and/or kV according to patient size and/or use of iterative reconstruction technique. COMPARISON:  Brain MRI 02/14/2018.  Head  CT 12/18/2020. FINDINGS: Brain: Large chronic right PCA territory infarct with  encephalomalacia and patchy additional encephalomalacia in posterior right MCA, left anterior and posterior MCA territories. These are stable since 12/18/2020. No superimposed midline shift, ventriculomegaly, mass effect, evidence of mass lesion, intracranial hemorrhage or evidence of cortically based acute infarction. Vascular: Extensive Calcified atherosclerosis at the skull base. No suspicious intracranial vascular hyperdensity. Skull: No acute osseous abnormality identified. Sinuses/Orbits: Visualized paranasal sinuses and mastoids are stable and well aerated. Other: No orbit or scalp soft tissue injury identified. IMPRESSION: 1. No acute traumatic injury identified. 2. Stable non contrast CT appearance of the brain since 2022 with advanced chronic ischemic disease. Electronically Signed   By: Genevie Ann M.D.   On: 06/23/2022 12:04   DG Shoulder Left  Result Date: 06/23/2022 CLINICAL DATA:  Fall EXAM: LEFT SHOULDER - 2+ VIEW COMPARISON:  None Available. FINDINGS: There is no evidence of fracture or dislocation. There is no evidence of arthropathy or other focal bone abnormality. Soft tissues are unremarkable. IMPRESSION: No fracture or dislocation of the left shoulder. Joint spaces are preserved. Electronically Signed   By: Delanna Ahmadi M.D.   On: 06/23/2022 11:42   DG Pelvis Portable  Result Date: 06/23/2022 CLINICAL DATA:  Status post fall. EXAM: PORTABLE PELVIS 1-2 VIEWS COMPARISON:  Dec 18, 2020 FINDINGS: There is no evidence of pelvic fracture or diastasis. No pelvic bone lesions are seen. Vascular calcifications noted. IMPRESSION: Negative. Electronically Signed   By: Fidela Salisbury M.D.   On: 06/23/2022 11:26   DG Chest Portable 1 View  Result Date: 06/23/2022 CLINICAL DATA:  Fall EXAM: PORTABLE CHEST 1 VIEW COMPARISON:  12/23/20 CXR FINDINGS: Status post median sternotomy and CABG. Cardiac and mediastinal contours are unchanged from prior exam. Sternotomy wires are intact. Aortic  atherosclerotic calcifications. No focal airspace opacity. No pleural effusion. No pneumothorax. No radiographically apparent displaced rib fractures. IMPRESSION: 1. No focal airspace opacity. 2. No radiographically apparent displaced rib fractures. Electronically Signed   By: Marin Roberts M.D.   On: 06/23/2022 11:26    Pending Labs Unresulted Labs (From admission, onward)    None       Vitals/Pain Today's Vitals   06/24/22 0600 06/24/22 0630 06/24/22 0834 06/24/22 0945  BP: 111/88 (!) 138/105  117/82  Pulse: 62 66  64  Resp: '19 12  16  '$ Temp:   98.4 F (36.9 C)   TempSrc:   Oral   SpO2: 100% 100%  100%  Weight:      Height:      PainSc:        Isolation Precautions No active isolations  Medications Medications  diltiazem (CARDIZEM) 1 mg/mL load via infusion 10 mg (10 mg Intravenous Bolus from Bag 06/23/22 1134)    And  diltiazem (CARDIZEM) 125 mg in dextrose 5% 125 mL (1 mg/mL) infusion (0 mg/hr Intravenous Stopped 06/23/22 1232)  atorvastatin (LIPITOR) tablet 80 mg (80 mg Oral Given 06/24/22 0922)  amLODipine (NORVASC) tablet 5 mg (5 mg Oral Given 06/24/22 0922)  pantoprazole (PROTONIX) EC tablet 40 mg (40 mg Oral Given 06/24/22 0922)  apixaban (ELIQUIS) tablet 5 mg (5 mg Oral Given 06/24/22 0922)  arformoterol (BROVANA) nebulizer solution 15 mcg (15 mcg Nebulization Given 06/24/22 0921)    And  umeclidinium bromide (INCRUSE ELLIPTA) 62.5 MCG/ACT 1 puff (1 puff Inhalation Not Given 06/23/22 2002)  albuterol (PROVENTIL) (2.5 MG/3ML) 0.083% nebulizer solution 2.5 mg (has no administration in time range)  sodium chloride flush (NS) 0.9 %  injection 3 mL (3 mLs Intravenous Given 06/24/22 0922)  0.9 %  sodium chloride infusion (0 mLs Intravenous Stopped 06/24/22 0755)  acetaminophen (TYLENOL) tablet 650 mg (has no administration in time range)    Or  acetaminophen (TYLENOL) suppository 650 mg (has no administration in time range)  polyethylene glycol (MIRALAX / GLYCOLAX)  packet 17 g (17 g Oral Given 06/23/22 1642)  nicotine (NICODERM CQ - dosed in mg/24 hours) patch 14 mg (14 mg Transdermal Patch Applied 06/24/22 0923)  carvedilol (COREG) tablet 12.5 mg (12.5 mg Oral Given 06/24/22 0807)  sodium chloride 0.9 % bolus 1,000 mL (0 mLs Intravenous Stopped 06/23/22 1236)    Mobility walks with device High fall risk   Focused Assessments    R Recommendations: See Admitting Provider Note  Report given to:   Additional Notes: Plan is for PT to evaluate, home health at home. Family is concerned for ability to walk at home.

## 2022-06-24 NOTE — Progress Notes (Signed)
CSW completed FL2 and faxed patient's clinical information out for review to obtain bed offers.  Madilyn Fireman, MSW, LCSW Transitions of Care  Clinical Social Worker II [phone]

## 2022-06-24 NOTE — ED Notes (Signed)
Pt saturated in urine. Pt's bed linens changed, clean chux placed under patient. New Purewick placed on patient.

## 2022-06-24 NOTE — Evaluation (Signed)
Physical Therapy Evaluation Patient Details Name: Kimberly [name] MRN: [id number] DOB: 03-17-1956 Today's Date: 06/24/2022  History of Present Illness  The pt is a 66 yo female presenting 11/10 with R hip and L shoulder after fall at home. Pt found to be in afib with RVR. PMH includes: cardiomyopathy, HLD, HTN, CAD s/p CABG, afib on Eliquis, gout, and CVA with L-sided hemiparesis.   Clinical Impression  Pt in bed upon arrival of PT, agreeable to evaluation at this time. Prior to admission the pt was able to complete transfers and short bouts of ambulation in her home independently or with use of RW. She also reports use of WC at times for longer distances. The pt reports she has assist from her daughter and aide (2hrs/day) to assist with ADLs and IADLs, but is typically independent and home alone for the day. The pt now presents with limitations in functional mobility, strength, power, standing and seated balance, and endurance due to above dx, and will continue to benefit from skilled PT to address these deficits. The pt required min-modA to complete bed mobility, sit-stand transfers, and minA to complete short bout of ambulation in the room (~5 ft, pt declined progression due to pain and fatigue even after seated rest). The pt is unsafe to return home without assist at this time as she needs physical assist for any OOB mobility, therefore recommend SNF rehab to regain strength, stability, and independence prior to return home.  The pt states her family cannot provide the assist needed at this time, if family were able to provide constant supervision and assist with any OOB mobility, she could return home with HHPT.       Recommendations for follow up therapy are one component of a multi-disciplinary discharge planning process, led by the attending physician.  Recommendations may be updated based on patient status, additional functional criteria and insurance authorization.  Follow Up  Recommendations Skilled nursing-short term rehab (<3 hours/day) Can patient physically be transported by private vehicle: Yes    Assistance Recommended at Discharge Frequent or constant Supervision/Assistance  Patient can return home with the following  A lot of help with walking and/or transfers;A lot of help with bathing/dressing/bathroom;Assistance with cooking/housework;Direct supervision/assist for medications management;Direct supervision/assist for financial management;Assist for transportation;Help with stairs or ramp for entrance    Equipment Recommendations None recommended by PT (pt has needed DME)  Recommendations for Other Services  OT consult    Functional Status Assessment Patient has had a recent decline in their functional status and demonstrates the ability to make significant improvements in function in a reasonable and predictable amount of time.     Precautions / Restrictions Precautions Precautions: Fall Precaution Comments: 4-5 falls in last 6 months, check orthostatics Restrictions Weight Bearing Restrictions: No      Mobility  Bed Mobility Overal bed mobility: Needs Assistance Bed Mobility: Supine to Sit, Sit to Supine     Supine to sit: Mod assist Sit to supine: Max assist   General bed mobility comments: modA to complete transition to sitting EOB, maxA to return to supine from EOB    Transfers Overall transfer level: Needs assistance Equipment used: Rolling walker (2 wheels) Transfers: Sit to/from Stand Sit to Stand: Min assist, Mod assist           General transfer comment: minA to steady with initial rise, progressed to modA with reps and fatigue. posterior lean. poor grip with LUE    Ambulation/Gait Ambulation/Gait assistance: Min assist Gait Distance (Feet):  5 Feet Assistive device: Rolling walker (2 wheels) Gait Pattern/deviations: Step-to pattern, Decreased stride length, Leaning posteriorly, Trunk flexed Gait velocity:  decreased Gait velocity interpretation: <1.31 ft/sec, indicative of household ambulator   General Gait Details: pt taking small forwards, backwards, and lateral steps with BUE support on RW. leaning posteriorly and needing min-modA to steady. very limited endurance. declined any additional ambulation after seated rest    Balance Overall balance assessment: Needs assistance Sitting-balance support: Single extremity supported, Feet supported Sitting balance-Leahy Scale: Fair Sitting balance - Comments: falling posteriorly with any challenge Postural control: Posterior lean Standing balance support: Bilateral upper extremity supported, During functional activity Standing balance-Leahy Scale: Poor Standing balance comment: dependent on BUE support and min-modA                             Pertinent Vitals/Pain Pain Assessment Pain Assessment: Faces Faces Pain Scale: Hurts even more Pain Location: "all over" Pain Descriptors / Indicators: Discomfort, Sore, Grimacing Pain Intervention(s): Limited activity within patient's tolerance, Monitored during session, Repositioned    Home Living Family/patient expects to be discharged to:: Private residence Living Arrangements: Alone Available Help at Discharge: Family;Personal care attendant;Available PRN/intermittently Type of Home: Apartment Home Access: Stairs to enter Entrance Stairs-Rails: None Entrance Stairs-Number of Steps: 3   Home Layout: One level Home Equipment: Conservation officer, nature (2 wheels);Wheelchair - manual      Prior Function Prior Level of Function : Needs assist;History of Falls (last six months)       Physical Assist : Mobility (physical);ADLs (physical) Mobility (physical): Stairs;Gait ADLs (physical): Grooming;Bathing;Dressing;Toileting;IADLs Mobility Comments: pt using RW to bathroom, mostly WC ADLs Comments: assist with bathing, dressing provided by aide/daughter. aide cooks breakfast and preps lunch,  daughter brings dinner     Hand Dominance   Dominant Hand: Right    Extremity/Trunk Assessment   Upper Extremity Assessment Upper Extremity Assessment: Generalized weakness;LUE deficits/detail LUE Deficits / Details: residual deficits from stroke, grossly 3/5 against gravity, pt reports limited by general soreness and weakness LUE Sensation: decreased light touch LUE Coordination: decreased fine motor;decreased gross motor    Lower Extremity Assessment Lower Extremity Assessment: LLE deficits/detail;Generalized weakness LLE Deficits / Details: residual weakness and incoordination from stroke. pt reports sensation diminished. grossly 3/5 LLE Sensation: decreased light touch LLE Coordination: decreased fine motor;decreased gross motor    Cervical / Trunk Assessment Cervical / Trunk Assessment: Kyphotic  Communication   Communication: No difficulties  Cognition Arousal/Alertness: Awake/alert Behavior During Therapy: WFL for tasks assessed/performed Overall Cognitive Status: No family/caregiver present to determine baseline cognitive functioning                                 General Comments: pt able to follow simple cues and instructions, needed cues for bed mobility and safety. no family present to determine if change in cog        General Comments General comments (skin integrity, edema, etc.): BP stable with changes in position. pt reports dizziness sustained through entire session        Assessment/Plan    PT Assessment Patient needs continued PT services  PT Problem List Decreased strength;Decreased range of motion;Decreased activity tolerance;Decreased balance;Decreased mobility;Decreased coordination;Decreased safety awareness       PT Treatment Interventions DME instruction;Gait training;Stair training;Functional mobility training;Therapeutic activities;Therapeutic exercise;Balance training;Patient/family education    PT Goals (Current goals can  be found in the Care Plan  section)  Acute Rehab PT Goals Patient Stated Goal: regain strength to be home alone PT Goal Formulation: With patient Time For Goal Achievement: 07/08/22 Potential to Achieve Goals: Good    Frequency Min 2X/week        AM-PAC PT "6 Clicks" Mobility  Outcome Measure Help needed turning from your back to your side while in a flat bed without using bedrails?: A Lot Help needed moving from lying on your back to sitting on the side of a flat bed without using bedrails?: A Lot Help needed moving to and from a bed to a chair (including a wheelchair)?: A Lot Help needed standing up from a chair using your arms (e.g., wheelchair or bedside chair)?: A Little Help needed to walk in hospital room?: Total Help needed climbing 3-5 steps with a railing? : Total 6 Click Score: 11    End of Session Equipment Utilized During Treatment: Gait belt Activity Tolerance: Patient limited by fatigue Patient left: in bed;with call bell/phone within reach Nurse Communication: Mobility status PT Visit Diagnosis: Other abnormalities of gait and mobility (R26.89);Repeated falls (R29.6);Muscle weakness (generalized) (M62.81);Hemiplegia and hemiparesis Hemiplegia - Right/Left: Left Hemiplegia - dominant/non-dominant: Non-dominant Hemiplegia - caused by:  (chronic stroke)    Time: 6754-4920 PT Time Calculation (min) (ACUTE ONLY): 21 min   Charges:   PT Evaluation $PT Eval Moderate Complexity: 1 Mod          [place], PT, DPT   Acute Rehabilitation Department  Sandra Cockayne 06/24/2022, 1:58 PM

## 2022-06-25 DIAGNOSIS — N179 Acute kidney failure, unspecified: Secondary | ICD-10-CM | POA: Diagnosis not present

## 2022-06-25 DIAGNOSIS — I422 Other hypertrophic cardiomyopathy: Secondary | ICD-10-CM | POA: Diagnosis not present

## 2022-06-25 DIAGNOSIS — I480 Paroxysmal atrial fibrillation: Secondary | ICD-10-CM | POA: Diagnosis not present

## 2022-06-25 DIAGNOSIS — I4891 Unspecified atrial fibrillation: Secondary | ICD-10-CM | POA: Diagnosis not present

## 2022-06-25 DIAGNOSIS — I25708 Atherosclerosis of coronary artery bypass graft(s), unspecified, with other forms of angina pectoris: Secondary | ICD-10-CM | POA: Diagnosis not present

## 2022-06-25 MED ORDER — LIDOCAINE 5 % EX PTCH
1.0000 | MEDICATED_PATCH | CUTANEOUS | Status: DC
  Administered 2022-06-25 – 2022-06-27 (×3): 1 via TRANSDERMAL
  Filled 2022-06-25 (×3): qty 1

## 2022-06-25 NOTE — TOC Progression Note (Addendum)
Transition of Care [facility]) - Progression Note    Patient Details  Name: Kimberly [name] MRN: [id number] Date of Birth: [birth year]  Transition of Care [facility]) CM/SW [place], LCSW Phone Number: 06/25/2022, 3:12 PM  Clinical Narrative:     CSW started pt's auth with a start date of 06/26/2022. Facility will need to be updated once bed is confirmed at chosen facility. Reference # [id number].  TOC team will continue to assist with discharge planning needs.   Expected Discharge Plan: [place] Barriers to Discharge: Continued Medical Work up  Expected Discharge Plan and Services Expected Discharge Plan: [place]   Discharge Planning Services: CM Consult Post Acute Care Choice: [place] arrangements for the past 2 months: Apartment Expected Discharge Date: 06/25/22                         HH Arranged: PT, OT HH Agency: [place] Date [facility] Agency Contacted: 06/24/22 Time [place]: [id number] Representative spoke with at [place]: [place] Determinants of Health ([place]) Interventions    Readmission Risk Interventions   Row Labels 12/21/2020   11:08 AM  Readmission Risk Prevention Plan   Section Header. No data exists in this row.   Transportation Screening   Complete  PCP or Specialist Appt within 5-7 Days   Complete  Home Care Screening   Complete  Medication Review (RN CM)   Referral to Pharmacy

## 2022-06-25 NOTE — Progress Notes (Signed)
[facility] PROGRESS NOTE  CARESSA [name] (DOB: 1956/02/07) ZOX:[id number] PCP: [name], [facility] Specialists: Cardiology, Caron Presume, PA. Pulmonology, Dr. Lamonte Sakai. Podiatry, Dr. Elisha Ponder.  Brief Narrative: Kimberly [name] is a 66 y.o. female with a history of apical hypertrophic cardiomyopathy, CAD s/p CABG, PAF, CVA with residual hemiparesis, HTN, HLD, COPD, ongoing tobacco use who presented to the ED on 06/23/2022 after a fall at home. She's had some runny nose/congestion for the past few days, eating and drinking less than usual, and when attempting to walk with her walker, fell and hit her head on the floor. She was found to be in AFib with heart rate in 130's. SCr elevated at 1.23 (baseline 0.9), CBC wnl, Tn wnl x2. CXR, head CT and cervical spine CT all nonacute. IV fluids were given and the patient was admitted for AKI, AFib with RVR and concern for falling at home with PT evaluation pending. Diltiazem was bolused with plans for infusion which was not necessary due to prompt control of heart rate. Her home coreg dose has been increased with durably controlled ventricular rates. Creatinine returned to baseline. Echocardiogram shows apical hypertrophy and preserved LVEF stable from prior.   Evaluation by physical therapy showed the patient is not able to mobilize safely without assistance. She does not have consistent assistance at home, so muscular conditioning/rehabilitation is needed prior to return home. SNF placement is being pursued.   Subjective: Really enjoyed omelet, banana, hash browns for breakfast. She's still needing more assistance getting OOB than she normally does. Can't go home unattended like that. Having soreness in the lower back.  Objective: BP (!) 122/90 (BP Location: Left Arm)   Pulse 96   Temp (!) 97.5 F (36.4 C) (Oral)   Resp 17   Ht '5\' 7"'$  (1.702 m)   Wt 53.8 kg   SpO2 99%   BMI 18.58 kg/m   Gen: Pleasant female in no distress Pulm:  Clear, nonlabored  CV: Irreg irreg, rate in 80's, no JVD or pitting edema.  GI: Soft, NT, ND, +BS  Neuro: Alert and oriented, stable hemiparesis. No new focal deficits. Skin: Lower back appears without deformity or palpable step offs or rashes. No other/new rashes, lesions or ulcers on visualized skin   Echocardiogram 06/24/2022:  - LVEF 60 to 65%, no RWMA. +severe concentric LVH of apical segment.  - RVSF mod-severely reduced, normal PASP, RVSP estimated 30.77mHg (prior 531mg). IVC normal caliber, blunted respirophasic response.  - Severe LAE, mod RAE. Mild MR.  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  - Increase home coreg, BP is very stable. Could titrate further, but rate reasonably controlled. - Continue home eliquis given her very high CHA2DS2-VASc score and hx CVA, though with head trauma (negative imaging) and frequent falls, it may be worth reexamining this.  - Since  she's remaining in the hospital, will continue cardiac monitoring for now, but does not need progressive level of care.   URI: No evidence of pneumonia or bacterial infection at this time. Covid, flu, RVP all negative. - Symptomatic management, symptoms now resolved.  Near  syncope: No orthostatic hypotension or symptoms with PT today, though she is at exceedingly high risk for falls and has been doing so at home more often.  - Continue PT/OT while here as much as possible.    AKI: Due to dehydration from poor oral intake from likely viral URI. Improved with some IVF which are no longer needed. - Avoid nephrotoxins.    History of stroke: Residual hemiparesis. Primarily gets around with a walker, and at times needs wheelchair. Unfortunately, this acute event seems to have pushed her just a bit but enough into debility that requires rehabilitation to safely return home. She does not have a sufficient level of assistance at home to safely discharge at this time.  - Continue eliquis, statin   CAD s/p 3v CABG 2008, apical variant HCM, HTN, HLD: Echo as reported above is stable. Preserved LVEF.   - Continue home carvedilol, atorvastatin, eliquis, norvasc   COPD: No exacerbation at this time. - Replace home Stiolto with formulary [place] and [place] as needed albuterol   Tobacco use: - Cessation counseling - Nicotine patch  Patrecia Pour, MD [facility] [email or web address] 06/25/2022, 10:25 AM

## 2022-06-25 NOTE — Evaluation (Signed)
Occupational Therapy Evaluation Patient Details Name: Kimberly [name] MRN: [id number] DOB: 10/01/1955 Today's Date: 06/25/2022   History of Present Illness The pt is a 66 yo female presenting 11/10 with R hip and L shoulder after fall at home. Pt found to be in afib with RVR. PMH includes: cardiomyopathy, HLD, HTN, CAD s/p CABG, afib on Eliquis, gout, and CVA with L-sided hemiparesis.   Clinical Impression   PTA patient reports needing assist for ADLs/IADLs from aide/daughter, using RW vs w/c for mobility.  Admitted for above and presents with problem list below including impaired cognition, L sided chronic hemiparesis, pain, generalized weakness, dizziness and impaired balance.  She currently requires mod assist for bed mobility, mod assist for transfers and min-total assist for ADLs.  She reports dizziness at EOB, but BP stable today.  Agreeable to stand 1x but declines further mobility.  Will follow acutely, recommend SNF at dc.      Recommendations for follow up therapy are one component of a multi-disciplinary discharge planning process, led by the attending physician.  Recommendations may be updated based on patient status, additional functional criteria and insurance authorization.   Follow Up Recommendations  Skilled nursing-short term rehab (<3 hours/day)    Assistance Recommended at Discharge Frequent or constant Supervision/Assistance  Patient can return home with the following A lot of help with walking and/or transfers;A lot of help with bathing/dressing/bathroom;Assistance with cooking/housework;Direct supervision/assist for financial management;Direct supervision/assist for medications management;Assist for transportation;Help with stairs or ramp for entrance    Functional Status Assessment  Patient has had a recent decline in their functional status and demonstrates the ability to make significant improvements in function in a reasonable and predictable amount of time.   Equipment Recommendations  Other (comment) (defer)    Recommendations for Other Services       Precautions / Restrictions Precautions Precautions: Fall Precaution Comments: 4-5 falls in last 6 months, check orthostatics Restrictions Weight Bearing Restrictions: No      Mobility Bed Mobility Overal bed mobility: Needs Assistance Bed Mobility: Supine to Sit, Sit to Supine     Supine to sit: Mod assist Sit to supine: Mod assist   General bed mobility comments: mod assist for trunk support and scooting foward    Transfers Overall transfer level: Needs assistance Equipment used: Rolling walker (2 wheels) Transfers: Sit to/from Stand Sit to Stand: Mod assist           General transfer comment: mod assist to power up and steady at EOB      Balance Overall balance assessment: Needs assistance Sitting-balance support: Feet supported, No upper extremity supported Sitting balance-Leahy Scale: Fair Sitting balance - Comments: min guard for safety   Standing balance support: Bilateral upper extremity supported, During functional activity Standing balance-Leahy Scale: Poor Standing balance comment: relies on BUe and external support                           ADL either performed or assessed with clinical judgement   ADL Overall ADL's : Needs assistance/impaired     Grooming: Sitting;Set up           Upper Body Dressing : Set up;Sitting   Lower Body Dressing: Moderate assistance;Sit to/from Health and safety inspector Details (indicate cue type and reason): deferred pt declined         Functional mobility during ADLs: Moderate assistance;Rolling walker (2 wheels)       Vision  Vision Assessment?: No apparent visual deficits     Perception     Praxis      Pertinent Vitals/Pain Pain Assessment Pain Assessment: Faces Faces Pain Scale: Hurts even more Pain Location: "all over" Pain Descriptors / Indicators: Discomfort, Sore,  Grimacing Pain Intervention(s): Limited activity within patient's tolerance, Monitored during session, Repositioned     Hand Dominance Right   Extremity/Trunk Assessment Upper Extremity Assessment Upper Extremity Assessment: Generalized weakness;LUE deficits/detail LUE Deficits / Details: residual deficits from stroke, grossly 3/5 against gravity, pt reports limited by general soreness and weakness LUE Sensation: decreased light touch LUE Coordination: decreased fine motor;decreased gross motor   Lower Extremity Assessment Lower Extremity Assessment: Defer to PT evaluation   Cervical / Trunk Assessment Cervical / Trunk Assessment: Kyphotic   Communication Communication Communication: No difficulties   Cognition Arousal/Alertness: Awake/alert Behavior During Therapy: WFL for tasks assessed/performed Overall Cognitive Status: No family/caregiver present to determine baseline cognitive functioning                                 General Comments: pt oriented and following commands with increased time.  Poor safety awareness and difficulty problem solving.     General Comments  BP stable from supine to EOB, pt with poor tolerance in standing and declines further mobility    Exercises     Shoulder Instructions      Home Living Family/patient expects to be discharged to:: Private residence Living Arrangements: Alone Available Help at Discharge: Family;Personal care attendant;Available PRN/intermittently Type of Home: Apartment Home Access: Stairs to enter Entrance Stairs-Number of Steps: 3 Entrance Stairs-Rails: None Home Layout: One level     Bathroom Shower/Tub: Teacher, early years/pre: Standard Bathroom Accessibility: No   Home Equipment: Conservation officer, nature (2 wheels);Wheelchair - manual;Shower seat          Prior Functioning/Environment Prior Level of Function : Needs assist;History of Falls (last six months)             Mobility  Comments: pt using RW to bathroom (switches to counter once in bathroom), mostly WC ADLs Comments: assist with bathing from daughter, dressing provided by daughter. aide cooks breakfast and preps lunch, daughter brings dinner. uses BSC in room.        OT Problem List: Decreased strength;Decreased range of motion;Decreased activity tolerance;Impaired balance (sitting and/or standing);Decreased cognition;Decreased safety awareness;Decreased knowledge of use of DME or AE;Decreased knowledge of precautions;Pain      OT Treatment/Interventions: Self-care/ADL training;Therapeutic exercise;DME and/or AE instruction;Therapeutic activities;Cognitive remediation/compensation;Patient/family education;Balance training    OT Goals(Current goals can be found in the care plan section) Acute Rehab OT Goals Patient Stated Goal: less pain OT Goal Formulation: With patient Time For Goal Achievement: 07/09/22 Potential to Achieve Goals: Fair  OT Frequency: Min 2X/week    Co-evaluation              AM-PAC OT "6 Clicks" Daily Activity     Outcome Measure Help from another person eating meals?: A Little Help from another person taking care of personal grooming?: A Little Help from another person toileting, which includes using toliet, bedpan, or urinal?: A Lot Help from another person bathing (including washing, rinsing, drying)?: A Lot Help from another person to put on and taking off regular upper body clothing?: A Little Help from another person to put on and taking off regular lower body clothing?: A Lot 6 Click Score: 15   End  of Session Equipment Utilized During Treatment: Gait belt;Rolling walker (2 wheels) Nurse Communication: Mobility status  Activity Tolerance: Patient tolerated treatment well Patient left: in bed;with call bell/phone within reach;with bed alarm set  OT Visit Diagnosis: Other abnormalities of gait and mobility (R26.89);Muscle weakness (generalized) (M62.81);History of  falling (Z91.81);Hemiplegia and hemiparesis Hemiplegia - Right/Left: Left Hemiplegia - dominant/non-dominant: Non-Dominant Hemiplegia - caused by: Cerebral infarction (chronic)                Time: 9470-9628 OT Time Calculation (min): 25 min Charges:  OT General Charges $OT Visit: 1 Visit OT Evaluation $OT Eval Moderate Complexity: 1 Mod OT Treatments $Self Care/Home Management : 8-22 mins  Jolaine Artist, OT Acute Rehabilitation Services Office [phone]   Delight Stare 06/25/2022, 11:50 AM

## 2022-06-25 NOTE — TOC Progression Note (Signed)
Transition of Care [facility]) - Progression Note    Patient Details  Name: Kimberly [name] MRN: [id number] Date of Birth: 1956/02/24  Transition of Care [facility]) CM/SW [place], LCSW Phone Number: 06/25/2022, 10:50 AM  Clinical Narrative:     CSW was alerted of pt's pending discharge.CSW met with pt to provide bed offers. Pt informed CSW that she would like her sister to make the decision. Pt informed CSW that her sister in church therefore calling her in the afternoon would be best.  TOC team will continue to assist with discharge planning needs.    Expected Discharge Plan: [place] Barriers to Discharge: Continued Medical Work up  Expected Discharge Plan and Services Expected Discharge Plan: [place]   Discharge Planning Services: CM Consult Post Acute Care Choice: [place] arrangements for the past 2 months: Apartment Expected Discharge Date: 06/25/22                         HH Arranged: PT, OT HH Agency: [place] Date [facility] Agency Contacted: 06/24/22 Time [place]: 1129 Representative spoke with at [place]: [place] Determinants of Health ([place]) Interventions    Readmission Risk Interventions   Row Labels 12/21/2020   11:08 AM  Readmission Risk Prevention Plan   Section Header. No data exists in this row.   Transportation Screening   Complete  PCP or Specialist Appt within 5-7 Days   Complete  Home Care Screening   Complete  Medication Review (RN CM)   Referral to Pharmacy

## 2022-06-25 NOTE — Progress Notes (Signed)
Mobility Specialist - Progress Note   06/25/22 1527  Mobility  Activity Dangled on edge of bed  Level of Assistance Minimal assist, patient does 75% or more  Assistive Device None  Activity Response Tolerated poorly  Mobility Referral Yes  $Mobility charge 1 Mobility   Pt received in bed and agreeable. Session limited d/t pt feeling sick upon going EOB and declining further mobility. Pt returned to bed with all needs met.   Franki Monte  Mobility Specialist Please contact via Solicitor or Rehab office at [phone]

## 2022-06-25 NOTE — TOC Progression Note (Signed)
Transition of Care [facility]) - Progression Note    Patient Details  Name: CHAYIL GANTT MRN: [id number] Date of Birth: 01-16-1956  Transition of Care [facility]) CM/SW [place], LCSW Phone Number: 06/25/2022, 1:09 PM  Clinical Narrative:    CSW attempted to call pt's sister Stanton Kidney about SNF bed offers however had to leave voicemail.  TOC team will continue to assist with discharge planning needs.    Expected Discharge Plan: [place] Barriers to Discharge: Continued Medical Work up  Expected Discharge Plan and Services Expected Discharge Plan: [place]   Discharge Planning Services: CM Consult Post Acute Care Choice: [place] arrangements for the past 2 months: Apartment Expected Discharge Date: 06/25/22                         HH Arranged: PT, OT HH Agency: [place] Date [facility] Agency Contacted: 06/24/22 Time [place]: 1129 Representative spoke with at [place]: [place] Determinants of Health ([place]) Interventions    Readmission Risk Interventions   Row Labels 12/21/2020   11:08 AM  Readmission Risk Prevention Plan   Section Header. No data exists in this row.   Transportation Screening   Complete  PCP or Specialist Appt within 5-7 Days   Complete  Home Care Screening   Complete  Medication Review (RN CM)   Referral to Pharmacy

## 2022-06-26 ENCOUNTER — Telehealth: Payer: Self-pay

## 2022-06-26 DIAGNOSIS — I480 Paroxysmal atrial fibrillation: Secondary | ICD-10-CM | POA: Diagnosis not present

## 2022-06-26 DIAGNOSIS — I25708 Atherosclerosis of coronary artery bypass graft(s), unspecified, with other forms of angina pectoris: Secondary | ICD-10-CM | POA: Diagnosis not present

## 2022-06-26 DIAGNOSIS — I4891 Unspecified atrial fibrillation: Secondary | ICD-10-CM | POA: Diagnosis not present

## 2022-06-26 DIAGNOSIS — I422 Other hypertrophic cardiomyopathy: Secondary | ICD-10-CM | POA: Diagnosis not present

## 2022-06-26 DIAGNOSIS — N179 Acute kidney failure, unspecified: Secondary | ICD-10-CM | POA: Diagnosis not present

## 2022-06-26 MED ORDER — LIP MEDEX EX OINT
1.0000 | TOPICAL_OINTMENT | CUTANEOUS | Status: DC | PRN
  Administered 2022-06-27: 1 via TOPICAL
  Filled 2022-06-26: qty 7

## 2022-06-26 NOTE — TOC Progression Note (Signed)
Transition of Care [facility]) - Progression Note    Patient Details  Name: Kimberly [name] MRN: [id number] Date of Birth: July 12, 1956  Transition of Care [facility]) CM/SW Contact  Graves-Bigelow, Ocie Cornfield, RN Phone Number: 06/26/2022, 4:52 PM  Clinical Narrative: Case Manager received notification from the [place] that family wants to take the patient home with home health services. Case Manager was able to speak with patients sister Kimberly [name] regarding disposition needs. Case Manager explained PT/OT recommendations in detail to the sister. Kimberly [name] feels that the patient can return home with home health services. Kimberly [name] states that family can alternate care and the patient gets 2 hours of personal care services daily. Case Manager explained that the patient is a fall risk and has only ambulated 5 ft with physical therapy. Case Manager discussed in detail that the patient needs a safe disposition plan. Sister states she will be at the hospital tonight to speak with the patient and will follow up with the Case Manager in the morning regarding disposition plans. Staff RN and MD aware.    Expected Discharge Plan: [place] Barriers to Discharge: Continued Medical Work up  Expected Discharge Plan and Services Expected Discharge Plan: [place]   Discharge Planning Services: CM Consult Post Acute Care Choice: [place] arrangements for the past 2 months: Apartment Expected Discharge Date: 06/26/22                  HH Arranged: PT, OT HH Agency: [place] Date [place]: 06/24/22 Time [place]: 1129 Representative spoke with at [place]: [place]  Readmission Risk Interventions    12/21/2020   11:08 AM  Readmission Risk Prevention Plan  Transportation Screening Complete  PCP or Specialist Appt within 5-7 Days Complete  Home Care Screening Complete  Medication Review (RN CM) Referral to Pharmacy

## 2022-06-26 NOTE — TOC Progression Note (Addendum)
Transition of Care [facility]) - Progression Note    Patient Details  Name: Kimberly [name] MRN: [id number] Date of Birth: 21-Jul-1956  Transition of Care [facility]) CM/SW Contact  Reece Agar, [place] Phone Number: 06/26/2022, 12:00 PM  Clinical Narrative:    CSW spoke with pt about choosing a SNF, pt redirected CSW to contact her sister to make the decision. CSW attempted to contact pt sister, no answer, CSW left a VM.  CSW contacted pt sister back to discuss SNF options. Pt sister stated she (or pt's children) do not want pt to go to a SNF. Sister shared that the family can workout a schedule to be there with her but she cannot take her home today because she has to work overnight. Sister would like for [facility] to be set up but was not able to discuss [facility] options any further bc she was at work and had to call back.  CSW provided contact information so that pt's sister could follow up and speak about [facility] agencies and DC plan overall.    Expected Discharge Plan: [place] Barriers to Discharge: Continued Medical Work up  Expected Discharge Plan and Services Expected Discharge Plan: [place]   Discharge Planning Services: CM Consult Post Acute Care Choice: [place] arrangements for the past 2 months: Apartment Expected Discharge Date: 06/26/22                         HH Arranged: PT, OT HH Agency: [place] Date [facility] Agency Contacted: 06/24/22 Time [place]: 1129 Representative spoke with at [place]: [place] ([place]) Interventions    Readmission Risk Interventions    12/21/2020   11:08 AM  Readmission Risk Prevention Plan  Transportation Screening Complete  PCP or Specialist Appt within 5-7 Days Complete  Home Care Screening Complete  Medication Review (RN CM) Referral to Pharmacy

## 2022-06-26 NOTE — Telephone Encounter (Signed)
Transition Care Management Unsuccessful Follow-up Telephone Call  Date of discharge and from where:  06/24/2022  Attempts:  1st Attempt  Reason for unsuccessful TCM follow-up call:  Left voice message

## 2022-06-27 DIAGNOSIS — I4891 Unspecified atrial fibrillation: Secondary | ICD-10-CM | POA: Diagnosis not present

## 2022-06-27 DIAGNOSIS — I25708 Atherosclerosis of coronary artery bypass graft(s), unspecified, with other forms of angina pectoris: Secondary | ICD-10-CM | POA: Diagnosis not present

## 2022-06-27 DIAGNOSIS — I480 Paroxysmal atrial fibrillation: Secondary | ICD-10-CM | POA: Diagnosis not present

## 2022-06-27 DIAGNOSIS — N179 Acute kidney failure, unspecified: Secondary | ICD-10-CM | POA: Diagnosis not present

## 2022-06-27 DIAGNOSIS — I422 Other hypertrophic cardiomyopathy: Secondary | ICD-10-CM | POA: Diagnosis not present

## 2022-06-27 NOTE — TOC Progression Note (Signed)
Transition of Care [facility]) - Progression Note    Patient Details  Name: Kimberly [name] MRN: [id number] Date of Birth: December 04, 1955  Transition of Care [facility]) CM/SW Contact  Graves-Bigelow, Ocie Cornfield, RN Phone Number: 06/27/2022, 9:52 AM  Clinical Narrative:  Case Manager called the patients sister this morning regarding disposition needs. Sister [name] wants the patient to return home; however, patient is agreeable to SNF for rehab-patient states she wants to get stronger. [name] is aware that the patient desires SNF; CSW will call the sister to offer SNF choice. No further needs identified by Case Manager at this time.   Expected Discharge Plan: [place] Barriers to Discharge: Continued Medical Work up  Expected Discharge Plan and Services Expected Discharge Plan: [place]   Discharge Planning Services: CM Consult Post Acute Care Choice: [place] arrangements for the past 2 months: Apartment Expected Discharge Date: 06/26/22                   HH Arranged: PT, OT HH Agency: [place] Date [place]: 06/24/22 Time [place]: 1129 Representative spoke with at [place]: [place]  Readmission Risk Interventions    12/21/2020   11:08 AM  Readmission Risk Prevention Plan  Transportation Screening Complete  PCP or Specialist Appt within 5-7 Days Complete  Home Care Screening Complete  Medication Review (RN CM) Referral to Pharmacy

## 2022-06-27 NOTE — Progress Notes (Signed)
06/27/22 1400  Clinical Encounter Type  Visited With Patient  Visit Type Initial  Referral From Nurse Cicero Duck, RN)  Consult/Referral To Chaplain Melvenia Beam)  Recommendations Advance Directive Education  Spiritual Encounters  Spiritual Needs Emotional   Chaplain responded to page for spiritual care consultation requesting assistance for Advance Directive preparation with Ms. Shamir Sedlar. [place]. Chaplain met with Ms. Logie, at patient's bedside.   Ms. Hiley stated that she does NOT have a court appointed [place].  Ms. Saffran stated that she is NOT Married.   Ms. Hillman indicated that she intends to list her Daughter: Chaniyah Jahr, [phone] as her [place].   Ms. Gilkison indicated that she intends to list her Sister: [name],  [phone] as her [place].  Chaplain provided the Advance Directive packet as well as education on Advance Directives-documents an individual completes to communicate their health care directions in advance of a time when they may need them. Chaplain informed Ms. Wisler the documents which may be completed here in the hospital are the Living Will and [place].   Chaplain informed Ms. Mcdougle that the [place] is a legal document in which an individual names another person, as their [place], to communicate her health care decisions, when she is not able to communicate them for herself. The Health Care Agent's function can be temporary or permanent depending on her ability to make and communicate those decisions independently.   Chaplain informed Ms. Tonkinson in the absence of a [place], the state of [place] directs health care providers to look to the following individuals in the order listed: legal guardian; an attorney-in-fact under a general  power of attorney (POA) if that POA includes the right to make health care decisions; person's spouse; a 72 of her adult children; a 32 of adult brothers and sisters; or an individual who has an established relationship with you, who is acting in good faith and who can convey your wishes.  If none of these persons are available or willing to make medical decisions on a patient's behalf, the law allows the patient's doctor to make decisions for them as long as another doctor agrees with those decisions.  Chaplain also informed the patient that the Health Care agent has no decision-making authority over any affairs other than those related to her medical care.   The chaplain further educated Ms. Densmore that a Living Will is a legal document that allows her desires not to receive life-prolonging measures in the event that she has a condition that is incurable and will result in her death in a short period of time; they are unconscious, and doctors are confident that she will not regain consciousness; and/or she has advanced dementia or other substantial and irreversible loss of mental function.   The chaplain informed Ms. Ellenberger that life-prolonging measures are medical treatments that would only serve to postpone death, including breathing machines, kidney dialysis, antibiotics, artificial nutrition and hydration (tube feeding), and similar forms of treatment and that if an individual is able to express their wishes, they may also make them known without the use of a Living Will, but in the event that she is not able to express her wishes, a Living Will allows medical providers and the her family and friends to ensure that  they are not making decisions on her behalf, but rather serving as her voice to convey decisions that she has already made.   Ms. Butch is aware that the decision to create an advance directive is hers alone and she may choose not to complete the documents or may choose to complete  one portion or both.  Ms. Pilkenton was informed that she can revoke the documents at any time by striking through them and writing void or by completing new documents, but that it is also advisable that the individual verbally notify interested parties that her wishes have changed.   Ms. Demary is also aware that the document must be signed in the presence of a notary public and two witnesses and that this may be done while the patient is still admitted to the hospital or after discharge in the community. If they decide to complete Advance Directives after being discharged from the hospital, they have been advised to notify all interested parties and to provide those documents to their physicians and loved ones in addition to bringing them to the hospital in the event of another hospitalization.   The chaplain informed the Ms. Trouten that if she desires to proceed with completing the Advance Directive Documentation while she is still admitted, notary services are typically available at [facility] between the hours of 1:00 and 3:30 Monday-Thursday.   When the patient is ready to have these documents completed, the patient should request that their nurse place a spiritual care consult and indicate that the patient is ready to have their advance directives notarized so that arrangements for witnesses and notary public can be made.  If there is an immediate need to have notarized please page the Chaplain.   Please page spiritual care if the patient desires further education or has questions.   [address] [place], M. Min., [phone].

## 2022-06-27 NOTE — TOC Transition Note (Signed)
Transition of Care [facility])) - CM/SW Discharge Note   Patient Details  Name: Kimberly [name] MRN: [id number] Date of Birth: 25-Jul-1956  Transition of Care [facility]) CM/SW Contact:  Tresa Endo Phone Number: 06/27/2022, 2:57 PM   Clinical Narrative:    Patient will DC to: [place] date: 06/27/2022 Family notified: Pt Sister Transport by: Corey Harold   Per MD patient ready for DC to [facility]. RN to call report prior to discharge [phone]). RN, patient, patient's family, and facility notified of DC. Discharge Summary and FL2 sent to facility. DC packet on chart. Ambulance transport requested for patient.   CSW will sign off for now as social work intervention is no longer needed. Please consult [place] again if new needs arise.     Final next level of care: [place] Barriers to Discharge: Continued Medical Work up   Patient Goals and CMS Choice        Discharge Placement                       Discharge Plan and Services   Discharge Planning Services: CM Consult Post Acute Care Choice: Home Health                    HH Arranged: PT, OT [facility] Agency: [place] Date [place]: 06/24/22 Time [place]: 1129 Representative spoke with at [place]: [place] ([place]) Interventions     Readmission Risk Interventions    12/21/2020   11:08 AM  Readmission Risk Prevention Plan  Transportation Screening Complete  PCP or Specialist Appt within 5-7 Days Complete  Home Care Screening Complete  Medication Review (RN CM) Referral to Pharmacy

## 2022-06-27 NOTE — Progress Notes (Signed)
Mobility Specialist Progress Note   06/27/22 1245  Mobility  Activity Turned to right side;Turned to left side (bed level exercises)  Range of Motion/Exercises Active;All extremities  Activity Response Tolerated well   Patient received in supine, agreeable to participate. Just returned to bed not long ago so opted to bed level exercises.Tolerated without complaint or incident. Was left in supine with all needs met, call bell in reach.   Kimberly [name], BS EXP Mobility Specialist Please contact via SecureChat or Rehab office at [phone]

## 2022-06-27 NOTE — Progress Notes (Signed)
Physical Therapy Treatment Patient Details Name: Kimberly [name] MRN: [id number] DOB: Dec 18, 1955 Today's Date: 06/27/2022   History of Present Illness The pt is a 66 yo female presenting 11/10 with R hip and L shoulder after fall at home. Pt found to be in afib with RVR. PMH includes: cardiomyopathy, HLD, HTN, CAD s/p CABG, afib on Eliquis, gout, and CVA with L-sided hemiparesis.    PT Comments    Pt progressing slowly towards her physical therapy goals. Session focused on transfer training and functional strengthening. Pt requiring moderate assist for mobility and is unable to take more than a couple steps secondary to fatigue, weakness, and dizziness. BP 130/100 post mobility, HR 106-120 bpm. Will benefit from post acute rehab to address deficits and maximize functional mobility.     Recommendations for follow up therapy are one component of a multi-disciplinary discharge planning process, led by the attending physician.  Recommendations may be updated based on patient status, additional functional criteria and insurance authorization.  Follow Up Recommendations  Skilled nursing-short term rehab (<3 hours/day) Can patient physically be transported by private vehicle: No   Assistance Recommended at Discharge Frequent or constant Supervision/Assistance  Patient can return home with the following A lot of help with walking and/or transfers;A lot of help with bathing/dressing/bathroom;Assistance with cooking/housework;Direct supervision/assist for medications management;Direct supervision/assist for financial management;Assist for transportation;Help with stairs or ramp for entrance   Equipment Recommendations  None recommended by PT (pt has needed DME)    Recommendations for Other Services OT consult     Precautions / Restrictions Precautions Precautions: Fall Precaution Comments: 4-5 falls in last 6 months, check orthostatics Restrictions Weight Bearing Restrictions: No      Mobility  Bed Mobility               General bed mobility comments: OOB on BSC upon entry    Transfers Overall transfer level: Needs assistance Equipment used: Rolling walker (2 wheels) Transfers: Sit to/from Stand, Bed to chair/wheelchair/BSC Sit to Stand: Mod assist   Step pivot transfers: Mod assist       General transfer comment: Cues for pushing up with R hand and keeping L hand on RW in addition to anterior weight shift. ModA to power up from [facility] and recliner. Pivotal steps from [facility] to chair with decreased eccentric control and assist to lower    Ambulation/Gait                   Stairs             Wheelchair Mobility    Modified Rankin (Stroke Patients Only)       Balance Overall balance assessment: Needs assistance Sitting-balance support: Single extremity supported, Feet supported Sitting balance-Leahy Scale: Fair     Standing balance support: Bilateral upper extremity supported, During functional activity Standing balance-Leahy Scale: Poor Standing balance comment: dependent on BUE support and min-modA                            Cognition Arousal/Alertness: Awake/alert Behavior During Therapy: WFL for tasks assessed/performed Overall Cognitive Status: No family/caregiver present to determine baseline cognitive functioning                                 General Comments: pt able to follow simple cues and instructions, needed cues for bed mobility and safety. no family present to determine if  change in cog        Exercises General Exercises - Lower Extremity Long Arc Quad: Both, 15 reps, Seated Hip Flexion/Marching: Both, 10 reps, Seated Other Exercises Other Exercises: x5 sit to stands    General Comments        Pertinent Vitals/Pain Pain Assessment Pain Assessment: Faces Faces Pain Scale: Hurts even more Pain Location: "all over" Pain Descriptors / Indicators: Discomfort, Sore,  Grimacing Pain Intervention(s): Limited activity within patient's tolerance, Monitored during session    Home Living                          Prior Function            PT Goals (current goals can now be found in the care plan section) Acute Rehab PT Goals Patient Stated Goal: regain strength to be home alone PT Goal Formulation: With patient Time For Goal Achievement: 07/08/22 Potential to Achieve Goals: Good    Frequency    Min 2X/week      PT Plan Current plan remains appropriate    Co-evaluation              AM-PAC PT "6 Clicks" Mobility   Outcome Measure  Help needed turning from your back to your side while in a flat bed without using bedrails?: A Lot Help needed moving from lying on your back to sitting on the side of a flat bed without using bedrails?: A Lot Help needed moving to and from a bed to a chair (including a wheelchair)?: A Lot Help needed standing up from a chair using your arms (e.g., wheelchair or bedside chair)?: A Lot Help needed to walk in hospital room?: Total Help needed climbing 3-5 steps with a railing? : Total 6 Click Score: 10    End of Session Equipment Utilized During Treatment: Gait belt Activity Tolerance: Patient limited by fatigue Patient left: with call bell/phone within reach;in chair;Other (comment) (no chair alarm box; Architect notified) Nurse Communication: Mobility status PT Visit Diagnosis: Other abnormalities of gait and mobility (R26.89);Repeated falls (R29.6);Muscle weakness (generalized) (M62.81);Hemiplegia and hemiparesis Hemiplegia - Right/Left: Left Hemiplegia - dominant/non-dominant: Non-dominant Hemiplegia - caused by:  (chronic stroke)     Time: 1020-1037 PT Time Calculation (min) (ACUTE ONLY): 17 min  Charges:  $Therapeutic Activity: 8-22 mins                     Kimberly [name], PT, DPT Acute Rehabilitation Services Office [phone]    Kimberly [name] 06/27/2022, 10:46  AM

## 2022-06-27 NOTE — Progress Notes (Signed)
PROGRESS NOTE  Brief Narrative: Kimberly [name] is a 65 y.o. female brought to the hospital with another fall in the setting of lightheadedness after recent URI symptoms and poor oral intake. She was dehydrated with AKI and rapid atrial fibrillation. IV fluids and augmented coreg was administered with significant improvement. Her symptoms have resolved, though she remains deconditioned and a high risk of falls (on anticoagulation) without more rehabilitation. SNF disposition has been pursued.  Subjective: Patient agrees that she needs SNF level rehabilitation. RN reports she's asking for tech to assist with feeds. She is eating 100% of meals, but still only walks a few feet and that with significant assistance (previously walked with walker without assistance). No overnight events.   Objective: BP 110/88 (BP Location: Right Arm)   Pulse 68   Temp 98.2 F (36.8 C) (Oral)   Resp 16   Ht '5\' 7"'$  (1.702 m)   Wt 54.3 kg   SpO2 98%   BMI 18.75 kg/m   Gen: Nontoxic pleasant female in no distress Pulm: Clear and nonlabored  CV: Irreg irreg, rate in 60's. No edema GI: Soft, NT, ND, +BS   Assessment & Plan: The patient remains medically stable for discharge. There has been some communication with family that they would prefer the patient to return home. Based on her current functional mobility, even if assistance is available at home, she would be best served with SNF rehabilitation for reconditioning prior to going home. This has been the plan since she was medically stable for discharge days ago. The patient is in agreement. She remains discharged to SNF pending bed acceptance, authorization.  Patrecia Pour, MD Pager on amion 06/27/2022, 9:38 AM

## 2022-06-27 NOTE — TOC Progression Note (Addendum)
Transition of Care [facility]) - Progression Note    Patient Details  Name: Kimberly [name] MRN: [id number] Date of Birth: 1955-10-30  Transition of Care [facility] CM/SW Contact  Reece Agar, [place] Phone Number: 06/27/2022, 10:28 AM  Clinical Narrative:    CSW spoke with pt sister about SNF options, she wants to discuss the options with the pt and contact CSW back with a decision. Pt sister and pt both understand that pt is medically stable for DC.   CSW attempted to contact pt sister for a update on SNF, no answer.  Pt sister contacted CSW back and asked for [facility] if they are able to provide a private room. CSW contacted Kia at [facility], they are able to arrange a private room for pt.   CSW informed pt's sister, she is still not sure of pt going to SNF bc the holidays are coming up and she wants to be able to take her home (out of town). CSW followed up with Kia at [facility], she shared that pt could go with family for Thanksgiving but will have to be back by midnight. This information was shared with pt's sister.  CSW provided sister with Kia's contact information to follow up with any further questions.    Expected Discharge Plan: [place] Barriers to Discharge: Continued Medical Work up  Expected Discharge Plan and Services Expected Discharge Plan: [place]   Discharge Planning Services: CM Consult Post Acute Care Choice: [place] arrangements for the past 2 months: Apartment Expected Discharge Date: 06/26/22                         HH Arranged: PT, OT HH Agency: [place] Date [facility] Agency Contacted: 06/24/22 Time [place]: [id number] Representative spoke with at [place]: [place] ([place]) Interventions    Readmission Risk Interventions    12/21/2020   11:08 AM  Readmission Risk Prevention Plan  Transportation Screening Complete  PCP or Specialist Appt within 5-7 Days Complete  Home Care  Screening Complete  Medication Review (RN CM) Referral to Pharmacy

## 2022-06-28 ENCOUNTER — Telehealth: Payer: Self-pay

## 2022-06-28 NOTE — Telephone Encounter (Signed)
Transition Care Management Follow-up Telephone Call Date of discharge and from where: 06/26/2022 [facility]  How have you been since you were released from the hospital? Pt sister states she is now at [facility] rehab. Patient is doing better.  Any questions or concerns? No  Items Reviewed: Did the pt receive and understand the discharge instructions provided? Yes  Medications obtained and verified? Yes  Other? Yes  Any new allergies since your discharge? No  Dietary orders reviewed? Yes Do you have support at home? Yes   Home Care and Equipment/Supplies: Were home health services ordered? no If so, what is the name of the agency? N/a  Has the agency set up a time to come to the patient's home? no Were any new equipment or medical supplies ordered?  No What is the name of the medical supply agency? N/a Were you able to get the supplies/equipment? no Do you have any questions related to the use of the equipment or supplies? No  Functional Questionnaire: (I = Independent and D = Dependent) ADLs: I/d  Bathing/Dressing- I/d  Meal Prep- d  Eating- i  Maintaining continence- I/d  Transferring/Ambulation- I/d  Managing Meds- i  Follow up appointments reviewed:  PCP Hospital f/u appt confirmed? No  Scheduled to see n/a on n/a @ n/a. [place] [facility] f/u appt confirmed? No  Scheduled to see n/a on n/a @ n/a. Are transportation arrangements needed? No  If their condition worsens, is the pt aware to call PCP or go to the Emergency Dept.? Yes Was the patient provided with contact information for the PCP's office or ED? Yes Was to pt encouraged to call back with questions or concerns? Yes

## 2022-06-28 NOTE — Telephone Encounter (Signed)
Spoke with patients sister after patients discharge from hospital yesterday. Patients sister reports Ms [place] discharged to [facility].

## 2022-07-17 ENCOUNTER — Encounter: Payer: Medicaid Other | Admitting: Nurse Practitioner

## 2022-07-25 ENCOUNTER — Telehealth: Payer: Self-pay | Admitting: *Deleted

## 2022-07-25 ENCOUNTER — Encounter: Payer: Self-pay | Admitting: *Deleted

## 2022-07-25 NOTE — Progress Notes (Signed)
  Care Coordination Note  07/25/2022 Name: TESS POTTS MRN: [id number] DOB: 02-Sep-1955  Heloise Beecham Attia is a 66 y.o. year old female who is a primary care patient of [name], [place] and is actively engaged with the care management team. I reached out to [name] by phone today to assist with re-scheduling a follow up visit with the RN Case Manager  Follow up plan: Telephone appointment with care management team member scheduled for:08/10/22  [place]: [phone]

## 2022-07-25 NOTE — Progress Notes (Signed)
  Care Coordination Note  07/25/2022 Name: ELVERTA DIMICELI MRN: [id number] DOB: 12/21/1955  Heloise Beecham [place] is a 66 y.o. year old female who is a primary care patient of [name], [place] and is actively engaged with the care management team. I reached out to [place] by phone today to assist with re-scheduling a follow up visit with the RN Case Manager  Follow up plan: Unsuccessful telephone outreach attempt made. A HIPAA compliant phone message was left for the patient providing contact information and requesting a return call.    [place]  Direct Dial: [phone]

## 2022-07-25 NOTE — Progress Notes (Signed)
Called under wrong practice

## 2022-07-25 NOTE — Progress Notes (Signed)
  Care Coordination Note  07/25/2022 Name: Kimberly [name] MRN: [id number] DOB: 1956/03/07  [name] is a 66 y.o. year old female who is a primary care patient of [name], [place] and is actively engaged with the care management team. I reached out to [place] by phone today to assist with re-scheduling a follow up visit with the RN Case Manager  Follow up plan: Unsuccessful telephone outreach attempt made. A HIPAA compliant phone message was left for the patient providing contact information and requesting a return call.  [place]  Direct Dial: [phone]

## 2022-07-31 ENCOUNTER — Telehealth: Payer: Self-pay | Admitting: *Deleted

## 2022-07-31 NOTE — Progress Notes (Signed)
  Care Coordination Note  07/31/2022 Name: LACHE DAGHER MRN: [id number] DOB: 21-May-1956  Kimberly [name] is a 66 y.o. year old female who is a primary care patient of [name], [place] and is actively engaged with the care management team. I reached out to [place] by phone today to assist with re-scheduling a follow up visit with the RN Case Manager  Follow up plan: Unsuccessful telephone outreach attempt made. A HIPAA compliant phone message was left for the patient providing contact information and requesting a return call.   [place]  Direct Dial: [phone]

## 2022-08-11 NOTE — Progress Notes (Signed)
  Care Coordination Note  08/11/2022 Name: MISHIKA FLIPPEN MRN: [id number] DOB: 07-Oct-1955  [name] is a 66 y.o. year old female who is a primary care patient of [name], [place] and is actively engaged with the care management team. I reached out to [place] by phone today to assist with re-scheduling a follow up visit with the RN Case Manager  Follow up plan: Telephone appointment with care management team member scheduled for:08/30/22  [place]: [phone]

## 2022-08-15 ENCOUNTER — Ambulatory Visit (INDEPENDENT_AMBULATORY_CARE_PROVIDER_SITE_OTHER): Payer: 59 | Admitting: Nurse Practitioner

## 2022-08-15 ENCOUNTER — Encounter: Payer: Self-pay | Admitting: Nurse Practitioner

## 2022-08-15 VITALS — BP 118/78 | HR 78 | Temp 98.1°F | Ht 67.0 in | Wt 119.0 lb

## 2022-08-15 DIAGNOSIS — Z0000 Encounter for general adult medical examination without abnormal findings: Secondary | ICD-10-CM | POA: Diagnosis not present

## 2022-08-15 DIAGNOSIS — I25708 Atherosclerosis of coronary artery bypass graft(s), unspecified, with other forms of angina pectoris: Secondary | ICD-10-CM | POA: Diagnosis not present

## 2022-08-15 DIAGNOSIS — J449 Chronic obstructive pulmonary disease, unspecified: Secondary | ICD-10-CM | POA: Diagnosis not present

## 2022-08-15 DIAGNOSIS — M810 Age-related osteoporosis without current pathological fracture: Secondary | ICD-10-CM

## 2022-08-15 DIAGNOSIS — I119 Hypertensive heart disease without heart failure: Secondary | ICD-10-CM

## 2022-08-15 DIAGNOSIS — I77810 Thoracic aortic ectasia: Secondary | ICD-10-CM | POA: Diagnosis not present

## 2022-08-15 DIAGNOSIS — E440 Moderate protein-calorie malnutrition: Secondary | ICD-10-CM

## 2022-08-15 DIAGNOSIS — R7303 Prediabetes: Secondary | ICD-10-CM | POA: Diagnosis not present

## 2022-08-15 DIAGNOSIS — H538 Other visual disturbances: Secondary | ICD-10-CM | POA: Diagnosis not present

## 2022-08-15 DIAGNOSIS — I4819 Other persistent atrial fibrillation: Secondary | ICD-10-CM | POA: Diagnosis not present

## 2022-08-15 DIAGNOSIS — K5904 Chronic idiopathic constipation: Secondary | ICD-10-CM

## 2022-08-15 DIAGNOSIS — Z720 Tobacco use: Secondary | ICD-10-CM

## 2022-08-15 DIAGNOSIS — I422 Other hypertrophic cardiomyopathy: Secondary | ICD-10-CM

## 2022-08-15 DIAGNOSIS — I69359 Hemiplegia and hemiparesis following cerebral infarction affecting unspecified side: Secondary | ICD-10-CM | POA: Diagnosis not present

## 2022-08-15 MED ORDER — NICOTINE 14 MG/24HR TD PT24: 14.0000 mg | MEDICATED_PATCH | Freq: Every day | TRANSDERMAL | 1 refills | Status: DC

## 2022-08-15 NOTE — Patient Instructions (Addendum)

## 2022-08-15 NOTE — Progress Notes (Signed)
Subjective:    Kimberly [name] is a 67 y.o. female who presents for a Welcome to Medicare exam.   She was at [place] [facility] in November after having falls, none since then. She is doing more with getting up. She is sitting in the living room more. She has been more worried about her rent going up and her family is helping her out. They are trying to get her set up with Section 8. She is having problems with air conditioner/gas bill.  Wt Readings from Last 3 Encounters:  08/15/22 119 lb (54 kg)  06/27/22 119 lb 11.4 oz (54.3 kg)  03/21/22 116 lb 3.2 oz (52.7 kg)       Objective:    Today's Vitals   08/15/22 1215  BP: 118/78  Pulse: 78  Temp: 98.1 F (36.7 C)  SpO2: 95%  Weight: 119 lb (54 kg)  Height: '5\' 7"'$  (1.702 m)  PainSc: 9   Body mass index is 18.64 kg/m.  Medications Outpatient Encounter Medications as of 08/15/2022  Medication Sig   albuterol (VENTOLIN HFA) 108 (90 Base) MCG/ACT inhaler Inhale 2 puffs into the lungs every 6 (six) hours as needed for wheezing or shortness of breath.   alendronate (FOSAMAX) 70 MG tablet TAKE 1 TABLET BY MOUTH EVERY 7 DAYS. Take WITH A full GLASS OF WATER ON EMPTY stomach (Patient taking differently: Take 70 mg by mouth once a week.)   amLODipine (NORVASC) 5 MG tablet Take 1 tablet (5 mg total) by mouth daily.   atorvastatin (LIPITOR) 80 MG tablet TAKE 1 TABLET BY MOUTH EVERY DAY (Patient taking differently: Take 80 mg by mouth daily.)   carvedilol (COREG) 12.5 MG tablet Take 1 tablet (12.5 mg total) by mouth 2 (two) times daily with a meal.   docusate sodium (COLACE) 100 MG capsule Take 1 capsule (100 mg total) by mouth 2 (two) times daily as needed for mild constipation.   ELIQUIS 5 MG TABS tablet TAKE 1 TABLET BY MOUTH 2 TIMES DAILY (Patient taking differently: Take 5 mg by mouth 2 (two) times daily.)   FEROSUL 325 (65 Fe) MG tablet TAKE 1 TABLET BY MOUTH EVERY DAY WITH BREAKFAST (Patient taking differently: Take 325 mg by mouth daily with  breakfast.)   fluticasone (FLONASE) 50 MCG/ACT nasal spray Place 2 sprays into both nostrils daily.   hydrocortisone cream 1 % Apply 1 application topically daily as needed for itching.   methocarbamol (ROBAXIN) 500 MG tablet Take 500 mg by mouth every 8 (eight) hours as needed.   mirtazapine (REMERON) 15 MG tablet TAKE 1 TABLET BY MOUTH AT BEDTIME (Patient taking differently: Take 15 mg by mouth at bedtime.)   omeprazole (PRILOSEC) 20 MG capsule Take 20 mg by mouth daily.   pantoprazole (PROTONIX) 40 MG tablet TAKE 1 TABLET BY MOUTH EVERY DAY (Patient taking differently: Take 40 mg by mouth daily.)   Tiotropium Bromide-Olodaterol (STIOLTO RESPIMAT) 2.5-2.5 MCG/ACT AERS Inhale 2 puffs into the lungs daily. (Patient taking differently: Inhale 2 puffs into the lungs daily as needed (shortness of breath).)   Vitamin D, Ergocalciferol, (DRISDOL) 1.25 MG (50000 UNIT) CAPS capsule Take 1 capsule (50,000 Units total) by mouth 2 (two) times a week.   [DISCONTINUED] nicotine (NICODERM CQ - DOSED IN MG/24 HOURS) 14 mg/24hr patch Place 1 patch (14 mg total) onto the skin daily.   nicotine (NICODERM CQ - DOSED IN MG/24 HOURS) 14 mg/24hr patch Place 1 patch (14 mg total) onto the skin daily.   No  facility-administered encounter medications on file as of 08/15/2022.     History: Past Medical History:  Diagnosis Date   Bicipital tenosynovitis    Cerebral artery occlusion with cerebral infarction ([place]) 12/05/2007   Qualifier: Diagnosis of   By: Radene Ou MD, [place]       Coronary artery disease 05/2007   [id number]    Fall 02/13/2018   Family history of ischemic heart disease    Gout    Hammer toe    Hip, thigh, leg, and ankle, insect bite, nonvenomous, without mention of infection(916.4)    Hyperlipidemia    Hypertension    Osteoporosis 02/07/2022   Other abnormality of red blood cells    Rotator cuff syndrome of left shoulder    Stroke [facility])    Past Surgical History:  Procedure Laterality Date    CARDIAC CATHETERIZATION     COLONOSCOPY WITH PROPOFOL N/A 01/30/2020   Procedure: COLONOSCOPY WITH PROPOFOL;  Surgeon: Carol Ada, MD;  Location: [facility] ENDOSCOPY;  Service: Endoscopy;  Laterality: N/A;   CORONARY ARTERY BYPASS GRAFT     triple   CORONARY ARTERY BYPASS GRAFT  2008   POLYPECTOMY  01/30/2020   Procedure: POLYPECTOMY;  Surgeon: Carol Ada, MD;  Location: [facility] ENDOSCOPY;  Service: Endoscopy;;   TUBAL LIGATION      Family History  Problem Relation Age of Onset   Hypertension Mother    Heart attack Father    Diabetes Father    Kidney failure Father    Anxiety disorder Sister    Sarcoidosis Sister    Social History   Occupational History   Occupation: retired  Tobacco Use   Smoking status: Former    Packs/day: 2.00    Years: 35.00    Total pack years: 70.00    Types: Cigarettes    Quit date: 08/12/2019    Years since quitting: 3.0   Smokeless tobacco: Former    Quit date: 08/09/2008   Tobacco comments:    Stopped smoking in May 2022 ARJ   Vaping Use   Vaping Use: Never used  Substance and Sexual Activity   Alcohol use: Not Currently    Comment: ocassionally wine   Drug use: Not Currently    Types: Marijuana    Comment: history of cocaine use   Sexual activity: Not on file    Tobacco Counseling Counseling given: Yes Tobacco comments: Stopped smoking in May 2022 ARJ    Immunizations and Health Maintenance Immunization History  Administered Date(s) Administered   Influenza Split 06/24/2016   Influenza,inj,Quad PF,6+ Mos 07/29/2013, 06/17/2017, 05/17/2020   Influenza-Unspecified 07/18/2021   Moderna SARS-COV2 Booster Vaccination 09/30/2020   Moderna Sars-Covid-2 Vaccination 01/23/2020, 02/20/2020   PNEUMOCOCCAL CONJUGATE-20 03/21/2022   Zoster Recombinat (Shingrix) 03/16/2021   Health Maintenance Due  Topic Date Due   DTaP/Tdap/Td (1 - Tdap) Never done   COVID-19 Vaccine (3 - Moderna risk series) 10/28/2020   Zoster Vaccines- Shingrix (2 of 2)  05/11/2021    Activities of Daily Living    08/15/2022   12:21 PM 06/24/2022    9:00 PM  In your present state of health, do you have any difficulty performing the following activities:  Hearing? 0 0  Vision? 1 1  Difficulty concentrating or making decisions? 1 1  Walking or climbing stairs? 1 1  Dressing or bathing? 1 1  Doing errands, shopping? 1 1    Physical Exam  (optional), or other factors deemed appropriate based on the beneficiary's medical and social history and current clinical  standards.  Advanced Directives:   Does Patient Have a Medical Advance Directive?: Yes Does patient want to make changes to medical advance directive?: Yes (MAU/Ambulatory/Procedural Areas - Information given)    Assessment:    This is a routine wellness examination for this patient .   Vision/Hearing screen No results found.  Dietary issues and exercise activities discussed:      Goals      Manage My Medicine     Timeframe:  Long-Range Goal Priority:  High Start Date:                             Expected End Date:                       Follow Up Date 05/09/2022  In Process:  - call for medicine refill 2 or 3 days before it runs out - call if I am sick and can't take my medicine - keep a list of all the medicines I take; vitamins and herbals too - use a pillbox to sort medicine - use an alarm clock or phone to remind me to take my medicine    Why is this important?   These steps will help you keep on track with your medicines.   Notes:  -Pleas make sure to keep all appointments with the PCP team      Patient Stated     "Stop yelling and falling"      Rand (see longitudinal plan of care for additional care plan information)  Current Barriers:  Chronic Disease Management support, education, and care coordination needs related to Hypertension, Hyperlipidemia, Atrial Fibrillation, GERD, and Depression   Hypertension BP Readings from  Last 3 Encounters:  07/28/20 117/76  05/17/20 122/68  04/27/20 (!) 120/91  Pharmacist Clinical Goal(s): Over the next 90 days, patient will work with PharmD and providers to maintain BP goal <130/80 Current regimen:  Amlodipine 5 MG - take daily Interventions: Dietary and exercise recommendations provided Limit salt intake Patient self care activities - Over the next 90 days, patient will: Check BP  once per week and document, and provide at future appointments Ensure daily salt intake < 2300 mg/day  Hyperlipidemia Lab Results  Component Value Date/Time   LDLCALC 54 09/02/2019 04:51 PM  Pharmacist Clinical Goal(s): Over the next 90 days, patient will work with PharmD and providers to maintain LDL goal < 70 Current regimen:  Atorvastatin 80 MG- take daily Interventions: Dietary and exercise recommendations provided Increase intake of healthy fats (i.e avocados, walnuts, flaxseed) Decrease intake of foods high in saturated and trans fat (i.e. chips) Patient self care activities - Over the next 90 days, patient will: Work to improve diet, increase heart healthy fats and decrease trans/saturated fats  ATRIAL FIBRILLATION Pharmacist Clinical Goal(s) Over the next 90 days, patient will work with PharmD and providers to stay rate regulated  Current regimen:  ELIQUIS 5 MG- Take twice daily Metoprolol 25 mg - take twice daily  Interventions: Discussed signs and symptoms of bleeding Patient self care activities - Over the next 90 days, patient will: Continue to take medication twice per day  Depression Pharmacist Clinical Goal(s) Over the next 90 days, patient will work with PharmD and providers to improve sadness and crying Current regimen:  Citalopram 10 MG- take daily Interventions: Follow up with PCP team and determine if patients dose  can be adjusted to Citalopram 20 MG daily  Patient self care activities - Over the next 90 days, patient will: Continue to take medication  everyday.  Medication management Pharmacist Clinical Goal(s): Over the next 90 days, patient will work with PharmD and providers to maintain optimal medication adherence Current pharmacy: [facility] Interventions Comprehensive medication review performed. Continue current medication management strategy Patient self care activities - Over the next 90 days, patient will: Focus on medication adherence by using pill packets provided by pharmacy Take medications as prescribed Report any questions or concerns to PharmD and/or provider(s)  Please see past updates related to this goal by clicking on the "Past Updates" button in the selected goal       To keep working on improving her Diabetes     Care Coordination Interventions: Provided education to patient about basic DM disease process Review of patient status, including review of consultants reports, relevant laboratory and other test results, and medications completed Assessed social determinant of health barriers Confirmed sister [name] received and reviewed the DM educational materials mailed following last RN CC contact, she denies having questions Instructed sister [name] to switch patient's Ensure to Glucerna, provided rationale Mailed letter with coupons for Glucerna Advised sister [name] of dual coverage UHC U card and food stamps may be used to purchase Glucerna  Lab Results  Component Value Date   HGBA1C 6.2 (H) 03/21/2022         Depression Screen    08/15/2022   12:12 PM 08/10/2021   10:39 AM 03/30/2020   10:59 AM 12/02/2019   12:17 PM  PHQ 2/9 Scores  PHQ - 2 Score '2 2 2 1  '$ PHQ- 9 Score '7 11 16 6     '$ Fall Risk    08/15/2022   12:13 PM  Fall Risk   Falls in the past year? 1  Number falls in past yr: 0  Injury with Fall? 1  Risk for fall due to : History of fall(s)  Follow up Falls evaluation completed    Cognitive Function:        08/15/2022   12:22 PM  6CIT Screen  What Year? 0 points  What  month? 0 points  What time? 3 points  Count back from 20 0 points  Months in reverse 2 points  Repeat phrase 8 points  Total Score 13 points    Patient Care Team: [name], FNP as PCP - General ([place]) [name], MD as PCP - Cardiology (Cardiology) Verl Blalock, Marijo Conception, MD (Inactive) (Cardiology) Rex Kras, Claudette Stapler, RN as Case Manager Pieter Partridge, DO as Consulting Physician (Neurology) Mayford Knife, [facility] (Pharmacist)     Plan:   Encounter for Medicare annual wellness exam Pt's annual wellness exam was performed and geriatric assessment reviewed.  Pt has multiple identifiable wellness concerns at this time related to social determinants of health. I will reach out to [place] and Social Worker to see if there are any additional ways to help the patient.  WIll obtain routine labs.  Will obtain UA and micro.  Behavior modifications discussed and diet history reviewed. Pt will continue to exercise regularly and modify diet, with low GI, plant based foods and decrease food intake of processed foods.  Recommend intake of daily multivitamin, Vitamin D, and calcium. Recommend mammogram and colonoscopy (both are up to date) for preventive screenings, as well as recommend immunizations that include influenza (up to date) and TDAP (will check to see if had  at pharmacy)  Hypertensive heart disease without heart failure - Blood pressure is well-controlled, continue current medications . EKG done with Atrial flutter-fibrillation  Voltage criteria for LVH (R(V5) exceeds 2.60 mV).  -ST depression  +  Extensive T-abnormality -Secondary to hypertrophy -possible Anterior/lateral and inferior ischemia. This is no change from her EKG in 06/2022. Plan: EKG 12-Lead  Prediabetes - Diet controlled, will check hemoglobinA1C. - Plan: Hemoglobin A1c, CMP14+EGFR  Age-related osteoporosis without current pathological fracture - Continue Fosamax, tolerating well  Malnutrition of  moderate degree (HCC) - Her weight is stable, encouraged to continue to eat healthy diet with high-protein  Hemiparesis affecting nondominant 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 ([place]) - No changes  Atherosclerosis of coronary artery bypass graft(s), unspecified, with other forms of angina pectoris ([place]) - Continue statin tolerating well  Chronic idiopathic constipation - Encouraged to stay well-hydrated and eat a high-fiber diet.  Movement will help  Chronic obstructive pulmonary disease, unspecified COPD type ([place]), Chronic - Stable, continue current medications no issues  Other hypertrophic cardiomyopathy ([place]), Chronic - Continue follow-up with cardiology regularly  Thoracic aortic ectasia ([place]), Chronic - Continue follow-up with cardiology  Persistent atrial fibrillation ([place]), Chronic - Continue follow-up with cardiology and continue Eliquis  Tobacco abuse - She is no longer smoking, has a 35 pack/year.  Will order low dose CT scan of lung for screening - Plan: CT CHEST LUNG CA SCREEN LOW DOSE W/O CM  Blurred vision, bilateral - She has continued to have blurred vision, did not go to opthalmology referral made last year.  Will make new referral - Plan: Ambulatory referral to Ophthalmology   I have personally reviewed and noted the following in the patient's chart:   Medical and social history Use of alcohol, tobacco or illicit drugs  Current medications and supplements Functional ability and status Nutritional status Physical activity Advanced directives List of other physicians Hospitalizations, surgeries, and ER visits in previous 12 months Vitals Screenings to include cognitive, depression, and falls Referrals and appointments  In addition, I have reviewed and discussed with patient certain preventive protocols, quality metrics, and best practice recommendations. A written personalized care plan for preventive services as well as general preventive health  recommendations were provided to patient.     [name], FNP 08/15/2022

## 2022-08-16 ENCOUNTER — Ambulatory Visit: Payer: Self-pay

## 2022-08-16 ENCOUNTER — Other Ambulatory Visit: Payer: Self-pay | Admitting: Nurse Practitioner

## 2022-08-16 DIAGNOSIS — E509 Vitamin A deficiency, unspecified: Secondary | ICD-10-CM

## 2022-08-16 LAB — CMP14+EGFR
ALT: 18 IU/L (ref 0–32)
AST: 16 IU/L (ref 0–40)
Albumin/Globulin Ratio: 2 (ref 1.2–2.2)
Albumin: 4.5 g/dL (ref 3.9–4.9)
Alkaline Phosphatase: 109 IU/L (ref 44–121)
BUN/Creatinine Ratio: 12 (ref 12–28)
BUN: 13 mg/dL (ref 8–27)
Bilirubin Total: 0.4 mg/dL (ref 0.0–1.2)
CO2: 21 mmol/L (ref 20–29)
Calcium: 9.7 mg/dL (ref 8.7–10.3)
Chloride: 109 mmol/L — ABNORMAL HIGH (ref 96–106)
Creatinine, Ser: 1.06 mg/dL — ABNORMAL HIGH (ref 0.57–1.00)
Globulin, Total: 2.3 g/dL (ref 1.5–4.5)
Glucose: 79 mg/dL (ref 70–99)
Potassium: 4.2 mmol/L (ref 3.5–5.2)
Sodium: 145 mmol/L — ABNORMAL HIGH (ref 134–144)
Total Protein: 6.8 g/dL (ref 6.0–8.5)
eGFR: 58 mL/min/{1.73_m2} — ABNORMAL LOW (ref >59)

## 2022-08-16 LAB — HEMOGLOBIN A1C
Est. average glucose Bld gHb Est-mCnc: 126 mg/dL
Hgb A1c MFr Bld: 6 % — ABNORMAL HIGH (ref 4.8–5.6)

## 2022-08-16 NOTE — Patient Instructions (Signed)
Visit Information  Thank you for taking time to visit with me today. Please don't hesitate to contact me if I can be of assistance to you.   Following are the goals we discussed today:   Goals Addressed             This Visit's Progress    COMPLETED: Care Coordination Activities       Care Coordination Interventions: Collaboration with RN Care Manager who requests SW contact the patients sister to assist with SDoH needs Spoke with [name] who indicates patient is under stress due to financial hardship and costs of living; patients family is assisting and plans to continue assisting with excess bills Reviewed patient is receiving dual coverage and is aware of U card benefit to assist with heat and/or groceries monthly. Patient also receives FNS, PCS and is on the wait list for Section 8. Patients family also assisting with care needs as patient only receives 2 hours of PCS per day Determined the patient remains uninterested in [place] [facility] or placement Advised [name] that at this time the patient is linked to all available resources, encouraged the family to contact SW should the patient desire placement or a referral to the PACE program Collaboration with [place] and patients primary care provider [name] FNP to advise of outcome of call         If you are experiencing a Mental Health or [place] or need someone to talk to, please go to [facility] [place] [phone]) call [phone]  Patient verbalizes understanding of instructions and care plan provided today and agrees to view in [place]. Active MyChart status and patient understanding of how to access instructions and care plan via MyChart confirmed with patient.     No further follow up required: please contact me as needed.  [name], BSW, CDP Social Worker, Certified Dementia Practitioner [place] [facility]  Care  Coordination [phone]

## 2022-08-16 NOTE — Patient Outreach (Signed)
  Care Coordination   Follow Up Visit Note   08/16/2022 Name: Kimberly [name] MRN: [id number] DOB: 1956-03-27  Kimberly [name] is a 68 y.o. year old female who sees [name], FNP for primary care. I  spoke with patients sister and caregiver Kimberly [name] by phone.  What matters to the patients health and wellness today?  Resource linking    Goals Addressed             This Visit's Progress    COMPLETED: Care Coordination Activities       Care Coordination Interventions: Collaboration with RN Care Manager who requests SW contact the patients sister to assist with SDoH needs Spoke with Kimberly [name] who indicates patient is under stress due to financial hardship and costs of living; patients family is assisting and plans to continue assisting with excess bills Reviewed patient is receiving dual coverage and is aware of U card benefit to assist with heat and/or groceries monthly. Patient also receives FNS, PCS and is on the wait list for Section 8. Patients family also assisting with care needs as patient only receives 2 hours of PCS per day Determined the patient remains uninterested in [place] [facility] or placement Advised Kimberly [name] that at this time the patient is linked to all available resources, encouraged the family to contact SW should the patient desire placement or a referral to the PACE program Collaboration with [place] and patients primary care provider [name] FNP to advise of outcome of call         SDOH assessments and interventions completed:  Yes  SDOH Interventions Today    Flowsheet Row Most Recent Value  SDOH Interventions   Food Insecurity Interventions Intervention Not Indicated  Housing Interventions Other (Comment)  [pt sister is assisting with bills,  patient is currently on the wait list for section 8. Not interested in placement at this time]  Transportation Interventions Intervention Not Indicated  Utilities Interventions Other  (Comment)  [pt family is assisting with bills to keep them up to date]  Financial Strain Interventions Other (Comment)  [pt is connected to available resources, family assists with finances as needed]        Care Coordination Interventions:  Yes, provided   Follow up plan: No further intervention required.   Encounter Outcome:  Pt. Visit Completed   [name], BSW, CDP Social Worker, Certified Dementia Practitioner [place] [facility]  Care Coordination [phone]

## 2022-08-18 DIAGNOSIS — Z87891 Personal history of nicotine dependence: Secondary | ICD-10-CM | POA: Diagnosis not present

## 2022-08-18 DIAGNOSIS — Z993 Dependence on wheelchair: Secondary | ICD-10-CM | POA: Diagnosis not present

## 2022-08-18 DIAGNOSIS — I4891 Unspecified atrial fibrillation: Secondary | ICD-10-CM | POA: Diagnosis not present

## 2022-08-18 DIAGNOSIS — Z9181 History of falling: Secondary | ICD-10-CM | POA: Diagnosis not present

## 2022-08-18 DIAGNOSIS — J449 Chronic obstructive pulmonary disease, unspecified: Secondary | ICD-10-CM | POA: Diagnosis not present

## 2022-08-18 DIAGNOSIS — E785 Hyperlipidemia, unspecified: Secondary | ICD-10-CM | POA: Diagnosis not present

## 2022-08-18 DIAGNOSIS — I480 Paroxysmal atrial fibrillation: Secondary | ICD-10-CM | POA: Diagnosis not present

## 2022-08-18 DIAGNOSIS — I69354 Hemiplegia and hemiparesis following cerebral infarction affecting left non-dominant side: Secondary | ICD-10-CM | POA: Diagnosis not present

## 2022-08-18 DIAGNOSIS — I2510 Atherosclerotic heart disease of native coronary artery without angina pectoris: Secondary | ICD-10-CM | POA: Diagnosis not present

## 2022-08-18 DIAGNOSIS — E440 Moderate protein-calorie malnutrition: Secondary | ICD-10-CM | POA: Diagnosis not present

## 2022-08-18 DIAGNOSIS — Z950 Presence of cardiac pacemaker: Secondary | ICD-10-CM | POA: Diagnosis not present

## 2022-08-18 DIAGNOSIS — Z7901 Long term (current) use of anticoagulants: Secondary | ICD-10-CM | POA: Diagnosis not present

## 2022-08-18 DIAGNOSIS — I10 Essential (primary) hypertension: Secondary | ICD-10-CM | POA: Diagnosis not present

## 2022-08-18 DIAGNOSIS — M109 Gout, unspecified: Secondary | ICD-10-CM | POA: Diagnosis not present

## 2022-08-20 NOTE — Progress Notes (Deleted)
Office Visit Note  Patient: Kimberly [name]             Date of Birth: Dec 02, 1955           MRN: [id number]             PCP: [name], FNP Referring: [name], FNP Visit Date: 08/22/2022 Occupation: '@GUAROCC'$ @  Subjective:  No chief complaint on file.   History of Present Illness: Kimberly [name] is a 67 y.o. female ***   Currently taking fosamax     Activities of Daily Living:  Patient reports morning stiffness for *** {minute/hour:19697}.   Patient {ACTIONS;DENIES/REPORTS:21021675::"Denies"} nocturnal pain.  Difficulty dressing/grooming: {ACTIONS;DENIES/REPORTS:21021675::"Denies"} Difficulty climbing stairs: {ACTIONS;DENIES/REPORTS:21021675::"Denies"} Difficulty getting out of chair: {ACTIONS;DENIES/REPORTS:21021675::"Denies"} Difficulty using hands for taps, buttons, cutlery, and/or writing: {ACTIONS;DENIES/REPORTS:21021675::"Denies"}  No Rheumatology ROS completed.   PMFS History:  Patient Active Problem List   Diagnosis Date Noted   Postural dizziness with near syncope 06/23/2022   COPD (chronic obstructive pulmonary disease) ([place]) 02/09/2022   Osteoporosis 02/07/2022   Pulmonary nodule 01/25/2021   Shortness of breath 01/25/2021   Sepsis ([place]) 12/18/2020   Family history of malignant neoplasm of gastrointestinal tract 12/07/2020   Personal history of colonic polyps 12/07/2020   Secondary hypercoagulable state ([place]) 10/14/2020   Right sided abdominal pain    Abdominal wall pain    Educated about COVID-19 virus infection 11/16/2019   Urinary frequency 04/14/2019   Cough 04/14/2019   AKI (acute kidney injury) ([place]) 11/26/2018   Diarrhea 11/25/2018   Dehydration, mild 11/25/2018   Hemiparesis affecting nondominant 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 ([place]) 07/31/2018   Muscle ache 07/31/2018   Prediabetes 07/31/2018   Chronic gout without tophus 07/31/2018   Vision loss 07/31/2018   Malnutrition of moderate degree 02/15/2018    Carotid occlusion, bilateral    Atrial fibrillation with RVR () 02/13/2018   Positive D dimer 02/13/2018   Back pain 02/13/2018   History of CVA (cerebrovascular accident) 02/13/2018   Paroxysmal atrial fibrillation ([place]) 02/13/2018   Coronary artery disease involving native coronary artery of native heart without angina pectoris 02/13/2018   Apical variant hypertrophic cardiomyopathy ([place]) 02/13/2018   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HCC) 02/13/2018   Aortic atherosclerosis ([place]) 02/13/2018   TOBACCO USE, QUIT 05/03/2009   Atherosclerosis of coronary artery bypass graft(s), unspecified, with other forms of angina pectoris ([place])    ROTATOR CUFF SYNDROME, LEFT 02/12/2009   Shoulder pain, left 11/03/2008   HAMMER TOE, ACQUIRED 02/11/2008   INSECT BITE, LEG 02/11/2008   Dyslipidemia 11/05/2007   UNSPECIFIED DISORDER TEETH&SUPPORTING STRUCTURES 11/05/2007   BICEPS TENDINITIS, RIGHT 04/24/2007   Essential hypertension 04/23/2007   MICROCYTOSIS 04/23/2007    Past Medical History:  Diagnosis Date   Bicipital tenosynovitis    Cerebral artery occlusion with cerebral infarction ([place]) 12/05/2007   Qualifier: Diagnosis of   By: Radene Ou MD, [place]       Coronary artery disease 05/2007   [id number]    Fall 02/13/2018   Family history of ischemic heart disease    Gout    Hammer toe    Hip, thigh, leg, and ankle, insect bite, nonvenomous, without mention of infection(916.4)    Hyperlipidemia    Hypertension    Osteoporosis 02/07/2022   Other abnormality of red blood cells    Rotator cuff syndrome of left shoulder    Stroke [facility])     Family History  Problem Relation Age of Onset   Hypertension Mother    Heart attack  Father    Diabetes Father    Kidney failure Father    Anxiety disorder Sister    Sarcoidosis Sister    Past Surgical History:  Procedure Laterality Date   CARDIAC CATHETERIZATION     COLONOSCOPY WITH PROPOFOL N/A 01/30/2020   Procedure: COLONOSCOPY WITH PROPOFOL;  Surgeon:  Carol Ada, MD;  Location: [facility];  Service: Endoscopy;  Laterality: N/A;   CORONARY ARTERY BYPASS GRAFT     triple   CORONARY ARTERY BYPASS GRAFT  2008   POLYPECTOMY  01/30/2020   Procedure: POLYPECTOMY;  Surgeon: Carol Ada, MD;  Location: [facility];  Service: Endoscopy;;   TUBAL LIGATION     Social History   Social History Narrative   Patient is right-handed. She lives alone in a one level home, 2 steps to enter. Her family is very active in her care.    Immunization History  Administered Date(s) Administered   Influenza Split 06/24/2016   Influenza,inj,Quad PF,6+ Mos 07/29/2013, 06/17/2017, 05/17/2020   Influenza-Unspecified 07/18/2021   Moderna SARS-COV2 Booster Vaccination 09/30/2020, 05/05/2022   Moderna Sars-Covid-2 Vaccination 01/23/2020, 02/20/2020   PNEUMOCOCCAL CONJUGATE-20 03/21/2022   Zoster Recombinat (Shingrix) 03/16/2021, 03/21/2022     Objective: Vital Signs: There were no vitals taken for this visit.   Physical Exam   Musculoskeletal Exam: ***  CDAI Exam: CDAI Score: -- Patient Global: --; Provider Global: -- Swollen: --; Tender: -- Joint Exam 08/22/2022   No joint exam has been documented for this visit   There is currently no information documented on the homunculus. Go to the Rheumatology activity and complete the homunculus joint exam.  Investigation: No additional findings.  Imaging: No results found.  Recent Labs: Lab Results  Component Value Date   WBC 6.3 06/24/2022   HGB 11.4 (L) 06/24/2022   PLT 176 06/24/2022   NA 145 (H) 08/15/2022   K 4.2 08/15/2022   CL 109 (H) 08/15/2022   CO2 21 08/15/2022   GLUCOSE 79 08/15/2022   BUN 13 08/15/2022   CREATININE 1.06 (H) 08/15/2022   BILITOT 0.4 08/15/2022   ALKPHOS 109 08/15/2022   AST 16 08/15/2022   ALT 18 08/15/2022   PROT 6.8 08/15/2022   ALBUMIN 4.5 08/15/2022   CALCIUM 9.7 08/15/2022   GFRAA >60 04/27/2020    Speciality Comments: No specialty comments  available.  Procedures:  No procedures performed Allergies: Clonidine derivatives and Spironolactone   Assessment / Plan:     Visit Diagnoses: Age-related osteoporosis without current pathological fracture - DEXA 09/22/21: AP spine BMD 0.768 with T-score -3.5  Orders: No orders of the defined types were placed in this encounter.  No orders of the defined types were placed in this encounter.   Face-to-face time spent with patient was *** minutes. Greater than 50% of time was spent in counseling and coordination of care.  Follow-Up Instructions: No follow-ups on file.   Ofilia Neas, PA-C  Note - This record has been created using Dragon software.  Chart creation errors have been sought, but may not always  have been located. Such creation errors do not reflect on  the standard of medical care.

## 2022-08-22 ENCOUNTER — Ambulatory Visit: Payer: Medicaid Other | Admitting: Rheumatology

## 2022-08-22 DIAGNOSIS — I69359 Hemiplegia and hemiparesis following cerebral infarction affecting unspecified side: Secondary | ICD-10-CM

## 2022-08-22 DIAGNOSIS — E440 Moderate protein-calorie malnutrition: Secondary | ICD-10-CM | POA: Diagnosis not present

## 2022-08-22 DIAGNOSIS — I25708 Atherosclerosis of coronary artery bypass graft(s), unspecified, with other forms of angina pectoris: Secondary | ICD-10-CM

## 2022-08-22 DIAGNOSIS — I4891 Unspecified atrial fibrillation: Secondary | ICD-10-CM

## 2022-08-22 DIAGNOSIS — E785 Hyperlipidemia, unspecified: Secondary | ICD-10-CM | POA: Diagnosis not present

## 2022-08-22 DIAGNOSIS — I77810 Thoracic aortic ectasia: Secondary | ICD-10-CM

## 2022-08-22 DIAGNOSIS — Z950 Presence of cardiac pacemaker: Secondary | ICD-10-CM | POA: Diagnosis not present

## 2022-08-22 DIAGNOSIS — Z8739 Personal history of other diseases of the musculoskeletal system and connective tissue: Secondary | ICD-10-CM

## 2022-08-22 DIAGNOSIS — I480 Paroxysmal atrial fibrillation: Secondary | ICD-10-CM | POA: Diagnosis not present

## 2022-08-22 DIAGNOSIS — Z9181 History of falling: Secondary | ICD-10-CM | POA: Diagnosis not present

## 2022-08-22 DIAGNOSIS — R911 Solitary pulmonary nodule: Secondary | ICD-10-CM

## 2022-08-22 DIAGNOSIS — Z993 Dependence on wheelchair: Secondary | ICD-10-CM | POA: Diagnosis not present

## 2022-08-22 DIAGNOSIS — M810 Age-related osteoporosis without current pathological fracture: Secondary | ICD-10-CM

## 2022-08-22 DIAGNOSIS — Z87891 Personal history of nicotine dependence: Secondary | ICD-10-CM | POA: Diagnosis not present

## 2022-08-22 DIAGNOSIS — I6523 Occlusion and stenosis of bilateral carotid arteries: Secondary | ICD-10-CM

## 2022-08-22 DIAGNOSIS — J449 Chronic obstructive pulmonary disease, unspecified: Secondary | ICD-10-CM | POA: Diagnosis not present

## 2022-08-22 DIAGNOSIS — I10 Essential (primary) hypertension: Secondary | ICD-10-CM | POA: Diagnosis not present

## 2022-08-22 DIAGNOSIS — I700 Atherosclerosis of aorta: Secondary | ICD-10-CM

## 2022-08-22 DIAGNOSIS — Z8619 Personal history of other infectious and parasitic diseases: Secondary | ICD-10-CM

## 2022-08-22 DIAGNOSIS — I2510 Atherosclerotic heart disease of native coronary artery without angina pectoris: Secondary | ICD-10-CM

## 2022-08-22 DIAGNOSIS — Z8709 Personal history of other diseases of the respiratory system: Secondary | ICD-10-CM

## 2022-08-22 DIAGNOSIS — I422 Other hypertrophic cardiomyopathy: Secondary | ICD-10-CM

## 2022-08-22 DIAGNOSIS — Z7901 Long term (current) use of anticoagulants: Secondary | ICD-10-CM | POA: Diagnosis not present

## 2022-08-22 DIAGNOSIS — D6869 Other thrombophilia: Secondary | ICD-10-CM

## 2022-08-22 DIAGNOSIS — I69354 Hemiplegia and hemiparesis following cerebral infarction affecting left non-dominant side: Secondary | ICD-10-CM | POA: Diagnosis not present

## 2022-08-22 DIAGNOSIS — M109 Gout, unspecified: Secondary | ICD-10-CM | POA: Diagnosis not present

## 2022-08-22 DIAGNOSIS — Z8673 Personal history of transient ischemic attack (TIA), and cerebral infarction without residual deficits: Secondary | ICD-10-CM

## 2022-08-22 DIAGNOSIS — Z86010 Personal history of colonic polyps: Secondary | ICD-10-CM

## 2022-08-23 DIAGNOSIS — R262 Difficulty in walking, not elsewhere classified: Secondary | ICD-10-CM | POA: Diagnosis not present

## 2022-08-23 DIAGNOSIS — R2689 Other abnormalities of gait and mobility: Secondary | ICD-10-CM | POA: Diagnosis not present

## 2022-08-23 DIAGNOSIS — M6281 Muscle weakness (generalized): Secondary | ICD-10-CM | POA: Diagnosis not present

## 2022-08-28 ENCOUNTER — Other Ambulatory Visit: Payer: Self-pay | Admitting: Cardiology

## 2022-08-28 ENCOUNTER — Other Ambulatory Visit: Payer: Self-pay | Admitting: Nurse Practitioner

## 2022-08-28 DIAGNOSIS — I422 Other hypertrophic cardiomyopathy: Secondary | ICD-10-CM | POA: Diagnosis not present

## 2022-08-28 DIAGNOSIS — I639 Cerebral infarction, unspecified: Secondary | ICD-10-CM | POA: Diagnosis not present

## 2022-08-29 DIAGNOSIS — Z87891 Personal history of nicotine dependence: Secondary | ICD-10-CM | POA: Diagnosis not present

## 2022-08-29 DIAGNOSIS — I2510 Atherosclerotic heart disease of native coronary artery without angina pectoris: Secondary | ICD-10-CM | POA: Diagnosis not present

## 2022-08-29 DIAGNOSIS — E440 Moderate protein-calorie malnutrition: Secondary | ICD-10-CM | POA: Diagnosis not present

## 2022-08-29 DIAGNOSIS — J449 Chronic obstructive pulmonary disease, unspecified: Secondary | ICD-10-CM | POA: Diagnosis not present

## 2022-08-29 DIAGNOSIS — Z950 Presence of cardiac pacemaker: Secondary | ICD-10-CM | POA: Diagnosis not present

## 2022-08-29 DIAGNOSIS — I480 Paroxysmal atrial fibrillation: Secondary | ICD-10-CM | POA: Diagnosis not present

## 2022-08-29 DIAGNOSIS — E785 Hyperlipidemia, unspecified: Secondary | ICD-10-CM | POA: Diagnosis not present

## 2022-08-29 DIAGNOSIS — I4891 Unspecified atrial fibrillation: Secondary | ICD-10-CM | POA: Diagnosis not present

## 2022-08-29 DIAGNOSIS — M109 Gout, unspecified: Secondary | ICD-10-CM | POA: Diagnosis not present

## 2022-08-29 DIAGNOSIS — I10 Essential (primary) hypertension: Secondary | ICD-10-CM | POA: Diagnosis not present

## 2022-08-29 DIAGNOSIS — Z7901 Long term (current) use of anticoagulants: Secondary | ICD-10-CM | POA: Diagnosis not present

## 2022-08-29 DIAGNOSIS — Z9181 History of falling: Secondary | ICD-10-CM | POA: Diagnosis not present

## 2022-08-29 DIAGNOSIS — I69354 Hemiplegia and hemiparesis following cerebral infarction affecting left non-dominant side: Secondary | ICD-10-CM | POA: Diagnosis not present

## 2022-08-29 DIAGNOSIS — Z993 Dependence on wheelchair: Secondary | ICD-10-CM | POA: Diagnosis not present

## 2022-08-30 ENCOUNTER — Ambulatory Visit: Payer: Self-pay

## 2022-08-30 NOTE — Patient Outreach (Signed)
  Care Coordination   Follow Up Visit Note   08/30/2022 Name: Kimberly [name] MRN: [id number] DOB: January 30, 1956  Kimberly [name] is a 67 y.o. year old female who sees [name], FNP for primary care. I spoke with sister [name] by phone today.  What matters to the patients health and wellness today?  Patient will have a lung CT as directed. Sister [name] will call the Pulmonologist to schedule a follow up to evaluate patient's shortness of breath.     Goals Addressed             This Visit's Progress    To evaluate shortness of breath       Care Coordination Interventions: Completed successful outbound call with sister [name] Provided patient with basic written and verbal COPD education on self care/management/and exacerbation prevention Provided instruction about proper use of medications used for management of COPD including inhalers Determined patient is established with [facility], Dr. Lamonte Sakai, noted last office visit completed on 02/09/22 Reviewed with sister [name], review of consultant's reports, relevant laboratory and other test results Educated sister regarding use of pulse oximeter to measure patient's Oxygen level at home, educated sister regarding target O2 sats, >90% Reviewed scheduled/upcoming provider appointment including: lung CT for cancer screening scheduled for 09/14/22       To prevent complications from Afib       Care Coordination Interventions: Counseled on increased risk of stroke due to Afib and benefits of anticoagulation for stroke prevention Reviewed importance of adherence to anticoagulant exactly as prescribed Counseled on avoidance of NSAIDs due to increased bleeding risk with anticoagulants Educated sister regarding STROKE symptoms warrant calling 911            SDOH assessments and interventions completed:  No     Care Coordination Interventions:  Yes, provided   Follow up plan: Follow up call scheduled for 10/25/22 '@09'$ :30  AM    Encounter Outcome:  Pt. Visit Completed

## 2022-08-30 NOTE — Patient Instructions (Signed)
Visit Information  Thank you for taking time to visit with me today. Please don't hesitate to contact me if I can be of assistance to you.   Following are the goals we discussed today:   Goals Addressed             This Visit's Progress    To evaluate shortness of breath       Care Coordination Interventions: Completed successful outbound call with sister [name] Provided patient with basic written and verbal COPD education on self care/management/and exacerbation prevention Provided instruction about proper use of medications used for management of COPD including inhalers Determined patient is established with  Pulmonology, Dr. Lamonte Sakai, noted last office visit completed on 02/09/22 Reviewed with sister [name], review of consultant's reports, relevant laboratory and other test results Educated sister regarding use of pulse oximeter to measure patient's Oxygen level at home, educated sister regarding target O2 sats, >90% Reviewed scheduled/upcoming provider appointment including: lung CT for cancer screening scheduled for 09/14/22       To prevent complications from Afib       Care Coordination Interventions: Counseled on increased risk of stroke due to Afib and benefits of anticoagulation for stroke prevention Reviewed importance of adherence to anticoagulant exactly as prescribed Counseled on avoidance of NSAIDs due to increased bleeding risk with anticoagulants Educated sister regarding STROKE symptoms warrant calling 911            Our next appointment is by telephone on 10/25/22 at 09:30 AM  Please call the care guide team at [phone] if you need to cancel or reschedule your appointment.   If you are experiencing a Mental Health or [place] or need someone to talk to, please go to [facility] [address], [place] [id number])  Patient verbalizes understanding of instructions and care plan provided today and  agrees to view in [place]. Active MyChart status and patient understanding of how to access instructions and care plan via MyChart confirmed with patient.     Barb Merino, RN, BSN, CCM Care Management Coordinator [facility] Care Management Direct Phone: [phone]

## 2022-09-05 ENCOUNTER — Other Ambulatory Visit: Payer: Self-pay | Admitting: Nurse Practitioner

## 2022-09-05 MED ORDER — LIDOCAINE 4 % EX PTCH: 1.0000 | MEDICATED_PATCH | CUTANEOUS | 2 refills | Status: DC

## 2022-09-11 ENCOUNTER — Other Ambulatory Visit: Payer: Self-pay | Admitting: Nurse Practitioner

## 2022-09-11 DIAGNOSIS — E509 Vitamin A deficiency, unspecified: Secondary | ICD-10-CM

## 2022-09-12 ENCOUNTER — Other Ambulatory Visit: Payer: Self-pay | Admitting: Nurse Practitioner

## 2022-09-14 ENCOUNTER — Inpatient Hospital Stay: Admission: RE | Admit: 2022-09-14 | Payer: 59 | Source: Ambulatory Visit

## 2022-09-15 ENCOUNTER — Ambulatory Visit: Payer: 59 | Admitting: Podiatry

## 2022-09-22 ENCOUNTER — Ambulatory Visit: Payer: 59 | Admitting: Podiatry

## 2022-09-27 ENCOUNTER — Other Ambulatory Visit: Payer: Self-pay | Admitting: Nurse Practitioner

## 2022-09-28 DIAGNOSIS — I422 Other hypertrophic cardiomyopathy: Secondary | ICD-10-CM | POA: Diagnosis not present

## 2022-09-28 DIAGNOSIS — I639 Cerebral infarction, unspecified: Secondary | ICD-10-CM | POA: Diagnosis not present

## 2022-10-18 ENCOUNTER — Other Ambulatory Visit: Payer: Self-pay | Admitting: Nurse Practitioner

## 2022-10-18 DIAGNOSIS — R630 Anorexia: Secondary | ICD-10-CM

## 2022-10-25 ENCOUNTER — Ambulatory Visit: Payer: Self-pay

## 2022-10-25 NOTE — Patient Outreach (Signed)
  Care Coordination   10/25/2022 Name: Kimberly [name] MRN: [id number] DOB: September 29, 1955   Care Coordination Outreach Attempts:  An unsuccessful telephone outreach was attempted for a scheduled appointment today.  Follow Up Plan:  Additional outreach attempts will be made to offer the patient care coordination information and services.   Encounter Outcome:  No Answer   Care Coordination Interventions:  No, not indicated    Barb Merino, RN, BSN, CCM Care Management Coordinator [facility] Care Management  Direct Phone: [phone]

## 2022-10-27 ENCOUNTER — Telehealth: Payer: Self-pay | Admitting: *Deleted

## 2022-10-27 DIAGNOSIS — I639 Cerebral infarction, unspecified: Secondary | ICD-10-CM | POA: Diagnosis not present

## 2022-10-27 DIAGNOSIS — I422 Other hypertrophic cardiomyopathy: Secondary | ICD-10-CM | POA: Diagnosis not present

## 2022-10-27 NOTE — Progress Notes (Unsigned)
  Care Coordination Note  10/27/2022 Name: Kimberly [name] MRN: [id number] DOB: 1956-06-26  Kimberly [name] is a 67 y.o. year old female who is a primary care patient of [name], [place] and is actively engaged with the care management team. I reached out to [place] by phone today to assist with re-scheduling a follow up visit with the RN Case Manager  Follow up plan: Unsuccessful telephone outreach attempt made. A HIPAA compliant phone message was left for the patient providing contact information and requesting a return call.   [place]  Direct Dial: [phone]

## 2022-10-31 NOTE — Progress Notes (Signed)
  Care Coordination Note  10/31/2022 Name: [name] MRN: [id number] DOB: March 23, 1956  Kimberly [name] is a 67 y.o. year old female who is a primary care patient of [name], [place] and is actively engaged with the care management team. I reached out to [place] by phone today to assist with re-scheduling a follow up visit with the RN Case Manager  Follow up plan: Unsuccessful telephone outreach attempt made. A HIPAA compliant phone message was left for the patient providing contact information and requesting a return call.  We have been unable to make contact with the patient for follow up. The care management team is available to follow up with the patient after provider conversation with the patient regarding recommendation for care management engagement and subsequent re-referral to the care management team.   [place]  Direct Dial: [phone]

## 2022-11-02 DIAGNOSIS — Z800 Family history of malignant neoplasm of digestive organs: Secondary | ICD-10-CM | POA: Insufficient documentation

## 2022-11-02 DIAGNOSIS — I679 Cerebrovascular disease, unspecified: Secondary | ICD-10-CM | POA: Insufficient documentation

## 2022-11-07 ENCOUNTER — Other Ambulatory Visit: Payer: Self-pay | Admitting: Nurse Practitioner

## 2022-11-07 DIAGNOSIS — Z0000 Encounter for general adult medical examination without abnormal findings: Secondary | ICD-10-CM

## 2022-11-08 ENCOUNTER — Other Ambulatory Visit: Payer: Self-pay

## 2022-11-08 DIAGNOSIS — R630 Anorexia: Secondary | ICD-10-CM

## 2022-11-08 MED ORDER — PANTOPRAZOLE SODIUM 40 MG PO TBEC: 40.0000 mg | DELAYED_RELEASE_TABLET | Freq: Every day | ORAL | 2 refills | Status: DC

## 2022-11-08 MED ORDER — MIRTAZAPINE 15 MG PO TABS: 15.0000 mg | ORAL_TABLET | Freq: Every day | ORAL | 2 refills | Status: DC

## 2022-11-17 ENCOUNTER — Other Ambulatory Visit: Payer: Self-pay | Admitting: Nurse Practitioner

## 2022-12-04 ENCOUNTER — Ambulatory Visit (INDEPENDENT_AMBULATORY_CARE_PROVIDER_SITE_OTHER): Payer: 59 | Admitting: Nurse Practitioner

## 2022-12-04 ENCOUNTER — Encounter: Payer: Self-pay | Admitting: Nurse Practitioner

## 2022-12-04 VITALS — BP 108/62 | HR 78 | Temp 98.0°F | Ht 67.0 in | Wt 116.4 lb

## 2022-12-04 DIAGNOSIS — E46 Unspecified protein-calorie malnutrition: Secondary | ICD-10-CM

## 2022-12-04 DIAGNOSIS — R051 Acute cough: Secondary | ICD-10-CM

## 2022-12-04 DIAGNOSIS — J449 Chronic obstructive pulmonary disease, unspecified: Secondary | ICD-10-CM

## 2022-12-04 DIAGNOSIS — I69359 Hemiplegia and hemiparesis following cerebral infarction affecting unspecified side: Secondary | ICD-10-CM

## 2022-12-04 DIAGNOSIS — I119 Hypertensive heart disease without heart failure: Secondary | ICD-10-CM | POA: Diagnosis not present

## 2022-12-04 DIAGNOSIS — U071 COVID-19: Secondary | ICD-10-CM | POA: Diagnosis not present

## 2022-12-04 DIAGNOSIS — I25708 Atherosclerosis of coronary artery bypass graft(s), unspecified, with other forms of angina pectoris: Secondary | ICD-10-CM | POA: Diagnosis not present

## 2022-12-04 DIAGNOSIS — R7303 Prediabetes: Secondary | ICD-10-CM | POA: Diagnosis not present

## 2022-12-04 DIAGNOSIS — I272 Pulmonary hypertension, unspecified: Secondary | ICD-10-CM

## 2022-12-04 LAB — POC COVID19 BINAXNOW: SARS Coronavirus 2 Ag: NEGATIVE

## 2022-12-04 MED ORDER — AZITHROMYCIN 250 MG PO TABS
ORAL_TABLET | ORAL | 0 refills | Status: AC
Start: 2022-12-04 — End: 2022-12-09

## 2022-12-04 NOTE — Progress Notes (Signed)
I,Sheena H Holbrook,acting as a Neurosurgeon for [name], FNP.,have documented all relevant documentation on the behalf of [name], FNP,as directed by  [name], FNP while in the presence of [name], FNP.    Subjective:     Patient ID: Kimberly [name] , female    DOB: 1956/04/20 , 67 y.o.   MRN: [id number]   Chief Complaint  Patient presents with   Medical Management of Chronic Issues    HPI  Patient presents today for congestion and cough since this past Wednesday. Daughter did 2 at home COVID tests, Wednesday was POS and Friday was NEG. She has some phlegm when she coughs. She is also fatigued. Sometimes will lose her appetite but not much since Wednesday. She can still taste and smell. She does complain of a headache. She reports having shortness of breath when sitting, has been occurring since before she became sick. She was having sweating, clamming  and was cold to the touch. Her daughter tested herself and she was negative. Patient denies new body aches, fevers/chills, nausea/vomiting or other symptoms.       Past Medical History:  Diagnosis Date   Bicipital tenosynovitis    Cerebral artery occlusion with cerebral infarction (HCC) 12/05/2007   Qualifier: Diagnosis of   By: Barbaraann Barthel MD, [place]       Coronary artery disease 05/2007   [id number]    Fall 02/13/2018   Family history of ischemic heart disease    Gout    Hammer toe    Hip, thigh, leg, and ankle, insect bite, nonvenomous, without mention of infection(916.4)    Hyperlipidemia    Hypertension    Osteoporosis 02/07/2022   Other abnormality of red blood cells    Rotator cuff syndrome of left shoulder    Stroke (HCC)      Family History  Problem Relation Age of Onset   Hypertension Mother    Heart attack Father    Diabetes Father    Kidney failure Father    Anxiety disorder Sister    Sarcoidosis Sister      Current Outpatient Medications:    albuterol (VENTOLIN HFA) 108 (90 Base) MCG/ACT  inhaler, inhale 2 PUFF into THE lungs EVERY 6 HOURS AS NEEDED FOR wheezing OR SHORTNESS OF BREATH, Disp: 8.5 g, Rfl: 0   alendronate (FOSAMAX) 70 MG tablet, TAKE 1 TABLET BY MOUTH EVERY 7 DAYS. Take WITH A full GLASS OF WATER ON EMPTY stomach, Disp: 12 tablet, Rfl: 2   amLODipine (NORVASC) 5 MG tablet, TAKE 1 TABLET BY MOUTH EVERY DAY, Disp: 90 tablet, Rfl: 3   ANORO ELLIPTA 62.5-25 MCG/ACT AEPB, INHALE 1 PUFF INTO THE LUNGS ONCE DAILY. RINSE MOUTH AFTER USE, Disp: 60 each, Rfl: 1   atorvastatin (LIPITOR) 80 MG tablet, TAKE 1 TABLET BY MOUTH EVERY DAY, Disp: 30 tablet, Rfl: 1   carvedilol (COREG) 12.5 MG tablet, TAKE 1 TABLET BY MOUTH 2 TIMES DAILY WITH A MEAL, Disp: 60 tablet, Rfl: 1   docusate sodium (COLACE) 100 MG capsule, Take 1 capsule (100 mg total) by mouth 2 (two) times daily as needed for mild constipation., Disp: 20 capsule, Rfl: 0   ELIQUIS 5 MG TABS tablet, TAKE 1 TABLET BY MOUTH 2 TIMES DAILY, Disp: 60 tablet, Rfl: 0   FEROSUL 325 (65 Fe) MG tablet, TAKE 1 TABLET BY MOUTH EVERY DAY WITH BREAKFAST (Patient taking differently: Take 325 mg by mouth daily with breakfast.), Disp: 30 tablet, Rfl: 8   fluticasone (FLONASE) 50 MCG/ACT  nasal spray, SPRAY 2 SPRAYS into EACH nostril DAILY, Disp: 16 g, Rfl: 1   hydrocortisone cream 1 %, Apply 1 application topically daily as needed for itching., Disp: , Rfl:    lidocaine (LIDODERM) 5 %, APPLY 1 PATCH TO SKIN DAILY, Disp: 30 patch, Rfl: 2   methocarbamol (ROBAXIN) 500 MG tablet, Take 500 mg by mouth every 8 (eight) hours as needed., Disp: , Rfl:    mirtazapine (REMERON) 15 MG tablet, Take 1 tablet (15 mg total) by mouth at bedtime., Disp: 30 tablet, Rfl: 2   nicotine (NICODERM CQ - DOSED IN MG/24 HOURS) 14 mg/24hr patch, Place 1 patch (14 mg total) onto the skin daily., Disp: 28 patch, Rfl: 1   pantoprazole (PROTONIX) 40 MG tablet, Take 1 tablet (40 mg total) by mouth daily., Disp: 30 tablet, Rfl: 2   Tiotropium Bromide-Olodaterol (STIOLTO RESPIMAT)  2.5-2.5 MCG/ACT AERS, Inhale 2 puffs into the lungs daily. (Patient taking differently: Inhale 2 puffs into the lungs daily as needed (shortness of breath).), Disp: 4 g, Rfl: 0   Vitamin D, Ergocalciferol, (DRISDOL) 1.25 MG (50000 UNIT) CAPS capsule, TAKE 1 CAPSULE BY MOUTH TWICE A WEEK, Disp: 24 capsule, Rfl: 1   Allergies  Allergen Reactions   Clonidine Derivatives Other (See Comments)    Patch causes scars   Spironolactone Rash     Review of Systems  Constitutional:  Positive for fatigue.  HENT:  Positive for congestion.   Eyes: Negative.   Respiratory:  Positive for cough and shortness of breath.   Cardiovascular: Negative.   Neurological: Negative.   Psychiatric/Behavioral: Negative.    All other systems reviewed and are negative.    Today's Vitals   12/04/22 1427  BP: 108/62  Pulse: 78  Temp: 98 F (36.7 C)  TempSrc: Oral  SpO2: 98%  Weight: 116 lb 6.4 oz (52.8 kg)  Height: 5\' 7"  (1.702 m)   Body mass index is 18.23 kg/m.   Objective:  Physical Exam Vitals reviewed.  Constitutional:      General: She is not in acute distress.    Appearance: Normal appearance.  Cardiovascular:     Pulses: Normal pulses.     Heart sounds: Normal heart sounds. No murmur heard. Pulmonary:     Effort: Pulmonary effort is normal. No respiratory distress.     Breath sounds: Normal breath sounds. No wheezing.  Neurological:     Mental Status: She is alert.         Assessment And Plan:     1. Acute cough - POC COVID-19 - azithromycin (ZITHROMAX) 250 MG tablet; Take 2 tablets (500 mg) on  Day 1,  followed by 1 tablet (250 mg) once daily on Days 2 through 5.  Dispense: 6 each; Refill: 0  2. Positive self-administered antigen test for COVID-19 Comments: She had one positive home covid test and one negative more than 5 days ago. She is outside of the window for antiviral treatment.  3. Prediabetes Comments: Will check HgbA1c, continue focusing on healthy diet. - Hemoglobin  A1c - BMP8+eGFR  4. Hypertensive heart disease without heart failure Comments: Blood pressure is well controlled, continue current medications - BMP8+eGFR  5. Protein malnutrition (HCC) Comments: Encouraged to drink ensure at least 2 times a week.  6. Pulmonary hypertension, unspecified (HCC)  7. Hemiplegia and hemiparesis following cerebral infarction affecting unspecified side (HCC)  8. Chronic obstructive pulmonary disease, unspecified COPD type (HCC) Comments: Continue inhaler as directed.  9. Atherosclerosis of coronary artery bypass graft(s),  unspecified, with other forms of angina pectoris (HCC) Comments: Continue statin, tolerating medications well.   Check on program for ensure.  Patient was given opportunity to ask questions. Patient verbalized understanding of the plan and was able to repeat key elements of the plan. All questions were answered to their satisfaction.  [name], FNP   I, [name], FNP, have reviewed all documentation for this visit. The documentation on 12/04/22 for the exam, diagnosis, procedures, and orders are all accurate and complete.   IF YOU HAVE BEEN REFERRED TO A SPECIALIST, IT MAY TAKE 1-2 WEEKS TO SCHEDULE/PROCESS THE REFERRAL. IF YOU HAVE NOT HEARD FROM US/SPECIALIST IN TWO WEEKS, PLEASE GIVE [place] A CALL AT [phone].   THE PATIENT IS ENCOURAGED TO PRACTICE SOCIAL DISTANCING DUE TO THE COVID-19 PANDEMIC.

## 2022-12-05 LAB — BMP8+EGFR
BUN/Creatinine Ratio: 19 (ref 12–28)
BUN: 24 mg/dL (ref 8–27)
CO2: 19 mmol/L — ABNORMAL LOW (ref 20–29)
Calcium: 9.6 mg/dL (ref 8.7–10.3)
Chloride: 108 mmol/L — ABNORMAL HIGH (ref 96–106)
Creatinine, Ser: 1.26 mg/dL — ABNORMAL HIGH (ref 0.57–1.00)
Glucose: 64 mg/dL — ABNORMAL LOW (ref 70–99)
Potassium: 3.9 mmol/L (ref 3.5–5.2)
Sodium: 147 mmol/L — ABNORMAL HIGH (ref 134–144)
eGFR: 47 mL/min/{1.73_m2} — ABNORMAL LOW (ref >59)

## 2022-12-05 LAB — HEMOGLOBIN A1C
Est. average glucose Bld gHb Est-mCnc: 128 mg/dL
Hgb A1c MFr Bld: 6.1 % — ABNORMAL HIGH (ref 4.8–5.6)

## 2022-12-06 ENCOUNTER — Other Ambulatory Visit: Payer: Self-pay | Admitting: Nurse Practitioner

## 2022-12-06 DIAGNOSIS — E509 Vitamin A deficiency, unspecified: Secondary | ICD-10-CM

## 2022-12-11 DIAGNOSIS — I272 Pulmonary hypertension, unspecified: Secondary | ICD-10-CM | POA: Insufficient documentation

## 2022-12-13 DIAGNOSIS — I639 Cerebral infarction, unspecified: Secondary | ICD-10-CM | POA: Diagnosis not present

## 2022-12-13 DIAGNOSIS — I422 Other hypertrophic cardiomyopathy: Secondary | ICD-10-CM | POA: Diagnosis not present

## 2022-12-14 ENCOUNTER — Ambulatory Visit: Payer: 59 | Admitting: Nurse Practitioner

## 2022-12-19 ENCOUNTER — Other Ambulatory Visit: Payer: Self-pay | Admitting: Cardiology

## 2022-12-21 ENCOUNTER — Ambulatory Visit
Admission: RE | Admit: 2022-12-21 | Discharge: 2022-12-21 | Disposition: A | Discharge status: Home / Self Care | Payer: 59 | Source: Ambulatory Visit | Attending: Nurse Practitioner | Admitting: Nurse Practitioner

## 2022-12-21 DIAGNOSIS — Z0000 Encounter for general adult medical examination without abnormal findings: Secondary | ICD-10-CM

## 2022-12-21 DIAGNOSIS — Z1231 Encounter for screening mammogram for malignant neoplasm of breast: Secondary | ICD-10-CM | POA: Diagnosis not present

## 2022-12-27 ENCOUNTER — Other Ambulatory Visit: Payer: Self-pay | Admitting: Nurse Practitioner

## 2022-12-27 DIAGNOSIS — R630 Anorexia: Secondary | ICD-10-CM

## 2022-12-28 ENCOUNTER — Other Ambulatory Visit: Payer: Self-pay | Admitting: Nurse Practitioner

## 2023-01-15 ENCOUNTER — Other Ambulatory Visit: Payer: Self-pay | Admitting: Nurse Practitioner

## 2023-01-15 ENCOUNTER — Other Ambulatory Visit: Payer: Self-pay | Admitting: Physician Assistant

## 2023-01-27 DIAGNOSIS — I639 Cerebral infarction, unspecified: Secondary | ICD-10-CM | POA: Diagnosis not present

## 2023-01-27 DIAGNOSIS — I422 Other hypertrophic cardiomyopathy: Secondary | ICD-10-CM | POA: Diagnosis not present

## 2023-02-12 ENCOUNTER — Other Ambulatory Visit: Payer: Self-pay | Admitting: Physician Assistant

## 2023-02-12 ENCOUNTER — Other Ambulatory Visit: Payer: Self-pay | Admitting: Emergency Medicine

## 2023-02-12 ENCOUNTER — Other Ambulatory Visit: Payer: Self-pay | Admitting: Nurse Practitioner

## 2023-02-26 DIAGNOSIS — I639 Cerebral infarction, unspecified: Secondary | ICD-10-CM | POA: Diagnosis not present

## 2023-02-26 DIAGNOSIS — I422 Other hypertrophic cardiomyopathy: Secondary | ICD-10-CM | POA: Diagnosis not present

## 2023-02-28 ENCOUNTER — Other Ambulatory Visit: Payer: Self-pay | Admitting: Nurse Practitioner

## 2023-03-13 ENCOUNTER — Other Ambulatory Visit: Payer: Self-pay | Admitting: Nurse Practitioner

## 2023-03-26 ENCOUNTER — Ambulatory Visit: Payer: 59 | Admitting: Nurse Practitioner

## 2023-03-29 ENCOUNTER — Other Ambulatory Visit: Payer: Self-pay | Admitting: Emergency Medicine

## 2023-03-29 DIAGNOSIS — I422 Other hypertrophic cardiomyopathy: Secondary | ICD-10-CM | POA: Diagnosis not present

## 2023-03-29 DIAGNOSIS — I639 Cerebral infarction, unspecified: Secondary | ICD-10-CM | POA: Diagnosis not present

## 2023-04-06 ENCOUNTER — Ambulatory Visit: Payer: 59 | Admitting: Student

## 2023-04-06 ENCOUNTER — Encounter: Payer: Self-pay | Admitting: Student

## 2023-04-06 VITALS — BP 113/80 | HR 123 | Ht 67.0 in | Wt 128.0 lb

## 2023-04-06 DIAGNOSIS — E785 Hyperlipidemia, unspecified: Secondary | ICD-10-CM

## 2023-04-06 DIAGNOSIS — Z720 Tobacco use: Secondary | ICD-10-CM

## 2023-04-06 DIAGNOSIS — I2510 Atherosclerotic heart disease of native coronary artery without angina pectoris: Secondary | ICD-10-CM

## 2023-04-06 DIAGNOSIS — I480 Paroxysmal atrial fibrillation: Secondary | ICD-10-CM | POA: Diagnosis not present

## 2023-04-06 DIAGNOSIS — I10 Essential (primary) hypertension: Secondary | ICD-10-CM

## 2023-04-06 NOTE — Patient Instructions (Signed)
 Medication Instructions:  Your physician recommends that you continue on your current medications as directed. Please refer to the Current Medication list given to you today.  *If you need a refill on your cardiac medications before your next appointment, please call your pharmacy*   Lab Work: NONE ordered at this time of appointment     Testing/Procedures: NONE ordered at this time of appointment    Follow-Up: At [facility], you and your health needs are our priority.  As part of our continuing mission to provide you with exceptional heart care, we have created designated Provider Care Teams.  These Care Teams include your primary Cardiologist (physician) and Advanced Practice Providers (APPs -  Physician Assistants and Nurse Practitioners) who all work together to provide you with the care you need, when you need it.  We recommend signing up for the patient portal called "MyChart".  Sign up information is provided on this After Visit Summary.  MyChart is used to connect with patients for Virtual Visits (Telemedicine).  Patients are able to view lab/test results, encounter notes, upcoming appointments, etc.  Non-urgent messages can be sent to your provider as well.   To learn more about what you can do with MyChart, go to [email or web address].    Your next appointment:   6 month(s)  Provider:   Rollene Rotunda, MD

## 2023-04-06 NOTE — Progress Notes (Signed)
Cardiology Clinic Note   Date: 04/06/2023 ID: Sheilla, Weisbrot 23-Mar-1956, MRN [id number]  Primary Cardiologist:  Rollene Rotunda, MD  Patient Profile    Kimberly [name] is a 67 y.o. female who presents to the clinic today for routine follow up.     Past medical history significant for: CAD. LHC 05/30/2007 (NSTEMI): Three-vessel CAD.  There are circumflex and RCA essentially nonobstructive disease.  Complex bifurcation disease LAD involving a very large diagonal branch.  CTS consult. CABG x 4 06/03/2007: LIMA to LAD, SVG to diagonal, SVG to circumflex marginal, SVG to posterior descending. Nuclear stress test 04/01/2020: Large partially reversible lateral wall perfusion defect consistent with infarct with significant region of peri-infarct ischemia. PAF. Onset January 2022. Carotid artery disease. Carotid ultrasound 03/23/2020: Occluded bilateral ICAs.  Low resistant waveforms involving the bilateral ECA consistent with internalization of the ECAs. Apical variant hypertrophic cardiomyopathy. Echo 06/23/2022: EF 60 to 65%.  Severe concentric LVH of the apical segment consistent with apical HCM.  Diastolic function could not be evaluated.  Moderate to severely reduced RV function.  Mild RVH.  Normal PA pressure.  Severe LAE.  Moderate RAE.  Mild MR.  Mild dilatation of ascending aorta 42 mm. COPD. Hypertension. Hyperlipidemia. CVA. Tobacco abuse. Quit smoking beginning of 2024.      History of Present Illness    Kimberly [name] is a longtime patient of cardiology followed by Dr. Antoine Poche for the above outlined history.  In summary, she has a history of CAD s/p CABG x 18 May 2007.  Nuclear stress test August 2021 showed infarct but no ischemia.  During hospital admission January 2022 for abdominal pain she was found to be in A-fib with RVR and converted to sinus with Cardizem drip. She was evaluated by afib clinic in March 2022 and cardioversion was discussed but family did  not want to proceed. She was discharged on Lopressor and Eliquis.  Echo at that time showed EF 65 to 70%.  She has a history of apical variant HCM.  She had a CVA with PCA subacute infarct in 2019.  At that time she was found to have occluded bilateral ICA which has been treated medically due to frailty.  She gets around publicly in a wheelchair with some walking with a walker at home.  Patient was last seen in the office by Juanda Crumble, PA-C on 02/15/2022 for routine follow up. Her BP was improved since stopping hydralazine and decreasing amlodipine. No medication changes were made.   Today, patient is accompanied by her daughter. She is doing well with no complaints. No chest pain, pressure, or tightness. She has mild lower extremity edema that is normal in the morning and worsens throughout the day. She sits in a dependent position for much of the day.  Denies orthopnea or PND. No shortness of breath at rest. She does have dyspnea and palpitations with exertion when she walks around her home with a walker. This is unchanged from previous and resolves with rest. Unfortunately she had a fall 2 weeks ago. Daughter explains she had bent over to pick something up from the floor and when she stood up she got lightheaded and fell back. She did not get injured and has been acting like her normal self since then. Her heart rate is elevated today in the 120s. Daughter feels this is due to being out today; at home it is typically in the 60-70s. Patient quit smoking at the beginning of the year and is  now utilizing a nicotine patch.        ROS: All other systems reviewed and are otherwise negative except as noted in History of Present Illness.  Studies Reviewed    EKG Interpretation Date/Time:  Friday April 06 2023 15:25:37 EDT Ventricular Rate:  123 PR Interval:  144 QRS Duration:  90 QT Interval:  342 QTC Calculation: 489 R Axis:   61  Text Interpretation: Sinus tachycardia ST & T wave abnormality,  consider inferior ischemia ST & T wave abnormality, consider anterolateral ischemia When compared with ECG of 23-Jun-2022 10:55, PREVIOUS ECG IS PRESENT Confirmed by Carlos Levering [phone]) on 04/06/2023 3:49:15 PM   Risk Assessment/Calculations     CHA2DS2-VASc Score = 6   This indicates a 9.7% annual risk of stroke. The patient's score is based upon: CHF History: 0 HTN History: 1 Diabetes History: 0 Stroke History: 2 Vascular Disease History: 1 Age Score: 1 Gender Score: 1             Physical Exam    VS:  BP 113/80   Pulse (!) 123   Ht 5\' 7"  (1.702 m)   Wt 128 lb (58.1 kg)   SpO2 98%   BMI 20.05 kg/m  , BMI Body mass index is 20.05 kg/m.  GEN: Well nourished, well developed, in no acute distress. Neck: No JVD or carotid bruits. Cardiac:  RRR. No murmurs. No rubs or gallops.   Respiratory:  Respirations regular and unlabored. Clear to auscultation without rales, wheezing or rhonchi. GI: Soft, nontender, nondistended. Extremities: Radials/DP/PT 2+ and equal bilaterally. No clubbing or cyanosis. Mild nonpitting edema bilateral lower extremities.   Skin: Warm and dry, no rash. Neuro: Strength intact.  Assessment & Plan    CAD.  S/p CABG x 18 May 2007.  Nuclear stress test August 2021 showed infarct but no ischemia. Patient  denies chest pain, tightness, pressure. Continue amlodipine, carvedilol, atorvastatin.  Not on aspirin secondary to Eliquis. PAF.  Onset January 2022.  Patient reports exertional palpitations that are not sustained and resolve with rest. Denies spontaneous bleeding concerns.  EKG today shows sinus tachycardia, rate 123 bpm. Patient's daughter reports heart rate normally in the 60-70s at home and feels higher heart rate today is due to her getting out of the house. Continue carvedilol and Eliquis. Carotid occlusion. Carotid US August 2021 showed occluded bilateral ICAs. Patient denies presyncope or syncope.  Hypertension: BP today 118/80. Patient  denies headaches, dizziness or vision changes. Continue carvedilol and amlodipine. Hyperlipidemia. LDL August 2023 47, at goal. Continue atorvastatin. Patient has a visit with PCP next week and will have labs drawn then.  Tobacco abuse. Patient quit smoking at the beginning of the year and is now utilizing a nicotine patient. She is congratulated on her efforts and encouraged to continue complete cessation.   Disposition: Return in 6 months or sooner as needed.          Signed, Etta Grandchild. Ja Ohman, DNP, NP-C

## 2023-04-10 NOTE — Progress Notes (Addendum)
Kimberly [name], CMA,acting as a Neurosurgeon for Kimberly Felts, FNP.,have documented all relevant documentation on the behalf of Kimberly Felts, FNP,as directed by  Kimberly Felts, FNP while in the presence of Kimberly Felts, FNP.  Subjective:  Patient ID: Kimberly [name] , female    DOB: May 15, 1956 , 67 y.o.   MRN: [id number]  Chief Complaint  Patient presents with   Hypertension    HPI  Patient presents today for a PRE DM and BP follow up, patient reports compliance with medications. Patient denies any chest pain, SOB, or headaches. Patient reports she gets short of breath when she tries to move around sometimes. Patient previously saw cardiologist a few weeks ago.  Patient is with her daughter today.   BP Readings from Last 3 Encounters: 04/11/23 : 110/70 04/06/23 : 113/80 12/04/22 : 108/62   Wt Readings from Last 3 Encounters: 04/11/23 : 128 lb (58.1 kg) 04/06/23 : 128 lb (58.1 kg) 12/04/22 : 116 lb 6.4 oz (52.8 kg)       Past Medical History:  Diagnosis Date   Bicipital tenosynovitis    Cerebral artery occlusion with cerebral infarction (HCC) 12/05/2007   Qualifier: Diagnosis of   By: Barbaraann Barthel MD, [place]       Coronary artery disease 05/2007   [id number]    Fall 02/13/2018   Family history of ischemic heart disease    Gout    Hammer toe    Hip, thigh, leg, and ankle, insect bite, nonvenomous, without mention of infection(916.4)    Hyperlipidemia    Hypertension    Osteoporosis 02/07/2022   Other abnormality of red blood cells    Rotator cuff syndrome of left shoulder    Stroke (HCC)      Family History  Problem Relation Age of Onset   Hypertension Mother    Heart attack Father    Diabetes Father    Kidney failure Father    Anxiety disorder Sister    Sarcoidosis Sister      Current Outpatient Medications:    alendronate (FOSAMAX) 70 MG tablet, TAKE 1 TABLET BY MOUTH EVERY 7 DAYS. Take WITH A full GLASS OF WATER ON EMPTY stomach, Disp: 12 tablet, Rfl: 2    amLODipine (NORVASC) 5 MG tablet, TAKE 1 TABLET BY MOUTH EVERY DAY, Disp: 90 tablet, Rfl: 3   ANORO ELLIPTA 62.5-25 MCG/ACT AEPB, INHALE 1 PUFF INTO THE LUNGS ONCE DAILY. RINSE MOUTH AFTER USE, Disp: 60 each, Rfl: 1   atorvastatin (LIPITOR) 80 MG tablet, TAKE 1 TABLET BY MOUTH EVERY DAY, Disp: 30 tablet, Rfl: 1   carvedilol (COREG) 12.5 MG tablet, TAKE 1 TABLET BY MOUTH TWICE DAILY WITH MEALS, Disp: 60 tablet, Rfl: 1   ELIQUIS 5 MG TABS tablet, TAKE 1 TABLET BY MOUTH 2 TIMES DAILY, Disp: 60 tablet, Rfl: 1   fluticasone (FLONASE) 50 MCG/ACT nasal spray, SPRAY 2 SPRAYS into EACH nostril DAILY, Disp: 16 g, Rfl: 1   hydrocortisone cream 1 %, Apply 1 application topically daily as needed for itching., Disp: , Rfl:    lidocaine (LIDODERM) 5 %, APPLY 1 PATCH TO SKIN DAILY, Disp: 30 patch, Rfl: 2   nicotine (NICODERM CQ - DOSED IN MG/24 HOURS) 14 mg/24hr patch, Place 1 patch (14 mg total) onto the skin daily., Disp: 28 patch, Rfl: 1   Vitamin D, Ergocalciferol, (DRISDOL) 1.25 MG (50000 UNIT) CAPS capsule, TAKE 1 CAPSULE BY MOUTH TWICE A WEEK, Disp: 24 capsule, Rfl: 1   albuterol (VENTOLIN HFA) 108 (90 Base) MCG/ACT  inhaler, INHALE 2 PUFFS INTO THE LUNGS EVERY 6 HOURS AS NEEDED FOR WHEEZING OR SHORTNESS OF BREATH, Disp: 8.5 g, Rfl: 0   mirtazapine (REMERON) 15 MG tablet, Take 1 tablet (15 mg total) by mouth at bedtime., Disp: 30 tablet, Rfl: 2   pantoprazole (PROTONIX) 40 MG tablet, TAKE 1 TABLET BY MOUTH EVERY DAY, Disp: 30 tablet, Rfl: 2   Allergies  Allergen Reactions   Clonidine Derivatives Other (See Comments)    Patch causes scars   Spironolactone Rash     Review of Systems  Constitutional: Negative.   HENT: Negative.    Eyes: Negative.   Respiratory:  Positive for shortness of breath (with exertion).   Cardiovascular: Negative.   Gastrointestinal: Negative.   Neurological: Negative.   Psychiatric/Behavioral: Negative.       Today's Vitals   04/11/23 1146  BP: 110/70  Pulse: (!) 56   Temp: 98 F (36.7 C)  TempSrc: Oral  Weight: 128 lb (58.1 kg)  Height: 5\' 7"  (1.702 m)  PainSc: 0-No pain   Body mass index is 20.05 kg/m.  Wt Readings from Last 3 Encounters:  04/11/23 128 lb (58.1 kg)  04/06/23 128 lb (58.1 kg)  12/04/22 116 lb 6.4 oz (52.8 kg)     Objective:  Physical Exam Vitals reviewed.  Constitutional:      General: She is not in acute distress.    Appearance: Normal appearance.  Cardiovascular:     Pulses: Normal pulses.     Heart sounds: Normal heart sounds. No murmur heard. Pulmonary:     Effort: Pulmonary effort is normal. No respiratory distress.     Breath sounds: Normal breath sounds. No wheezing.  Musculoskeletal:        General: Tenderness (lower extremity) present.     Right lower leg: Edema present.     Left lower leg: Edema present.  Neurological:     General: No focal deficit present.     Mental Status: She is alert and oriented to person, place, and time.     Cranial Nerves: No cranial nerve deficit.     Motor: No weakness.  Psychiatric:        Mood and Affect: Mood normal.        Behavior: Behavior normal.        Thought Content: Thought content normal.        Judgment: Judgment normal.         Assessment And Plan:  Prediabetes Assessment & Plan: HgbA1c is stable. Will check levels today  Orders: -     Hemoglobin A1c  Hypertensive heart disease without heart failure Assessment & Plan: Blood pressure is well controlled. Continue current medications  Orders: -     Basic metabolic panel  Atherosclerosis of coronary artery bypass graft(s), unspecified, with other forms of angina pectoris (HCC) Assessment & Plan: Continue statin, tolerating well.   Apical variant hypertrophic cardiomyopathy (HCC) Assessment & Plan: Continue f/u with Cardiology   Dyspnea on exertion Assessment & Plan: No abnormal lung sounds, this may be related to deconditioning will refer to Home health for PT for evaluation and treatment to  improve stamina and endurance  Orders: -     Ambulatory referral to Home Health  Chronic obstructive pulmonary disease, unspecified COPD type (HCC) Assessment & Plan: Continue current medication. If her shortness of breath does not improve she is to f/u with Pulmonary   Lower extremity edema Assessment & Plan: Non pitting edema, encouraged to wear compression socks if this  does not improve will consider hydrochlorothiazide as needed   History of CVA (cerebrovascular accident) -     Ambulatory referral to Home Health  Hemiplegia and hemiparesis following cerebral infarction affecting unspecified side (HCC) -     Ambulatory referral to Home Health  Need for influenza vaccination Assessment & Plan: Influenza vaccine administered Encouraged to take Tylenol as needed for fever or muscle aches.   Orders: -     Flu Vaccine Trivalent High Dose (Fluad)    Return for 6 month bp check.  Patient was given opportunity to ask questions. Patient verbalized understanding of the plan and was able to repeat key elements of the plan. All questions were answered to their satisfaction.    Jeanell Sparrow, FNP, have reviewed all documentation for this visit. The documentation on 05/09/23 for the exam, diagnosis, procedures, and orders are all accurate and complete.   IF YOU HAVE BEEN REFERRED TO A SPECIALIST, IT MAY TAKE 1-2 WEEKS TO SCHEDULE/PROCESS THE REFERRAL. IF YOU HAVE NOT HEARD FROM US/SPECIALIST IN TWO WEEKS, PLEASE GIVE [place] A CALL AT [phone].

## 2023-04-11 ENCOUNTER — Ambulatory Visit (INDEPENDENT_AMBULATORY_CARE_PROVIDER_SITE_OTHER): Payer: 59 | Admitting: Nurse Practitioner

## 2023-04-11 ENCOUNTER — Encounter: Payer: Self-pay | Admitting: Nurse Practitioner

## 2023-04-11 VITALS — BP 110/70 | HR 56 | Temp 98.0°F | Ht 67.0 in | Wt 128.0 lb

## 2023-04-11 DIAGNOSIS — R7303 Prediabetes: Secondary | ICD-10-CM | POA: Diagnosis not present

## 2023-04-11 DIAGNOSIS — I69359 Hemiplegia and hemiparesis following cerebral infarction affecting unspecified side: Secondary | ICD-10-CM

## 2023-04-11 DIAGNOSIS — I10 Essential (primary) hypertension: Secondary | ICD-10-CM

## 2023-04-11 DIAGNOSIS — I25708 Atherosclerosis of coronary artery bypass graft(s), unspecified, with other forms of angina pectoris: Secondary | ICD-10-CM

## 2023-04-11 DIAGNOSIS — R0609 Other forms of dyspnea: Secondary | ICD-10-CM

## 2023-04-11 DIAGNOSIS — R600 Localized edema: Secondary | ICD-10-CM

## 2023-04-11 DIAGNOSIS — I119 Hypertensive heart disease without heart failure: Secondary | ICD-10-CM

## 2023-04-11 DIAGNOSIS — J449 Chronic obstructive pulmonary disease, unspecified: Secondary | ICD-10-CM | POA: Diagnosis not present

## 2023-04-11 DIAGNOSIS — I422 Other hypertrophic cardiomyopathy: Secondary | ICD-10-CM | POA: Diagnosis not present

## 2023-04-11 DIAGNOSIS — Z23 Encounter for immunization: Secondary | ICD-10-CM

## 2023-04-11 DIAGNOSIS — Z8673 Personal history of transient ischemic attack (TIA), and cerebral infarction without residual deficits: Secondary | ICD-10-CM

## 2023-04-11 NOTE — Assessment & Plan Note (Signed)
Continue current medication. If her shortness of breath does not improve she is to f/u with Pulmonary

## 2023-04-11 NOTE — Assessment & Plan Note (Signed)
Continue f/u with Cardiology.

## 2023-04-11 NOTE — Assessment & Plan Note (Signed)
Blood pressure is well controlled. Continue current medications. 

## 2023-04-11 NOTE — Assessment & Plan Note (Signed)
Continue statin, tolerating well 

## 2023-04-11 NOTE — Assessment & Plan Note (Signed)
Non pitting edema, encouraged to wear compression socks if this does not improve will consider hydrochlorothiazide as needed

## 2023-04-11 NOTE — Assessment & Plan Note (Signed)
Influenza vaccine administered Encouraged to take Tylenol as needed for fever or muscle aches.

## 2023-04-11 NOTE — Patient Instructions (Signed)
Call [facility] for your Low dose CT scan of your lungs at [phone] to make an appt

## 2023-04-11 NOTE — Assessment & Plan Note (Signed)
HgbA1c is stable. Will check levels today

## 2023-04-11 NOTE — Assessment & Plan Note (Signed)
No abnormal lung sounds, this may be related to deconditioning will refer to Home health for PT for evaluation and treatment to improve stamina and endurance

## 2023-04-12 LAB — BASIC METABOLIC PANEL
BUN/Creatinine Ratio: 15 (ref 12–28)
BUN: 16 mg/dL (ref 8–27)
CO2: 23 mmol/L (ref 20–29)
Calcium: 9.4 mg/dL (ref 8.7–10.3)
Chloride: 107 mmol/L — ABNORMAL HIGH (ref 96–106)
Creatinine, Ser: 1.09 mg/dL — ABNORMAL HIGH (ref 0.57–1.00)
Glucose: 76 mg/dL (ref 70–99)
Potassium: 4.5 mmol/L (ref 3.5–5.2)
Sodium: 144 mmol/L (ref 134–144)
eGFR: 56 mL/min/{1.73_m2} — ABNORMAL LOW (ref >59)

## 2023-04-12 LAB — HEMOGLOBIN A1C
Est. average glucose Bld gHb Est-mCnc: 126 mg/dL
Hgb A1c MFr Bld: 6 % — ABNORMAL HIGH (ref 4.8–5.6)

## 2023-04-23 ENCOUNTER — Ambulatory Visit: Payer: 59 | Admitting: Podiatry

## 2023-04-25 ENCOUNTER — Other Ambulatory Visit: Payer: Self-pay | Admitting: Nurse Practitioner

## 2023-04-25 ENCOUNTER — Other Ambulatory Visit: Payer: Self-pay | Admitting: Emergency Medicine

## 2023-04-26 ENCOUNTER — Other Ambulatory Visit: Payer: Self-pay

## 2023-04-27 ENCOUNTER — Encounter: Payer: Self-pay | Admitting: Podiatry

## 2023-04-27 ENCOUNTER — Ambulatory Visit (INDEPENDENT_AMBULATORY_CARE_PROVIDER_SITE_OTHER): Payer: 59 | Admitting: Podiatry

## 2023-04-27 DIAGNOSIS — M79675 Pain in left toe(s): Secondary | ICD-10-CM | POA: Diagnosis not present

## 2023-04-27 DIAGNOSIS — B351 Tinea unguium: Secondary | ICD-10-CM | POA: Diagnosis not present

## 2023-04-27 DIAGNOSIS — M79674 Pain in right toe(s): Secondary | ICD-10-CM

## 2023-04-27 DIAGNOSIS — Z8673 Personal history of transient ischemic attack (TIA), and cerebral infarction without residual deficits: Secondary | ICD-10-CM

## 2023-04-27 NOTE — Progress Notes (Signed)
This patient returns to the office for evaluation and treatment of long thick painful nails .  This patient is unable to trim his own nails since the patient cannot reach her feet.  Patient says the nails are painful walking and wearing his shoes.  She returns for preventive foot care services.  General Appearance  Alert, conversant and in no acute stress.  Vascular  Dorsalis pedis and posterior tibial  pulses are palpable  bilaterally.  Capillary return is within normal limits  bilaterally. Temperature is within normal limits  bilaterally.  Neurologic  Senn-Weinstein monofilament wire test within normal limits  bilaterally. Muscle power within normal limits bilaterally.  Nails Thick disfigured discolored nails with subungual debris  from hallux to fifth toes bilaterally. No evidence of bacterial infection or drainage bilaterally.  Orthopedic  No limitations of motion  feet .  No crepitus or effusions noted.  No bony pathology .  Hammer toes  B/L.  Pes planus  B/L.  Skin  normotropic skin with no porokeratosis noted bilaterally.  No signs of infections or ulcers noted.     Onychomycosis  Pain in toes right foot  Pain in toes left foot  Debridement  of nails  1-5  B/L with a nail nipper.  Nails were then filed using a dremel tool with no incidents.    RTC  4 months    Helane Gunther DPM

## 2023-04-29 DIAGNOSIS — I639 Cerebral infarction, unspecified: Secondary | ICD-10-CM | POA: Diagnosis not present

## 2023-04-29 DIAGNOSIS — I422 Other hypertrophic cardiomyopathy: Secondary | ICD-10-CM | POA: Diagnosis not present

## 2023-05-01 ENCOUNTER — Other Ambulatory Visit: Payer: Self-pay | Admitting: Nurse Practitioner

## 2023-05-01 DIAGNOSIS — Z993 Dependence on wheelchair: Secondary | ICD-10-CM

## 2023-05-01 DIAGNOSIS — R0609 Other forms of dyspnea: Secondary | ICD-10-CM

## 2023-05-04 ENCOUNTER — Other Ambulatory Visit: Payer: Self-pay

## 2023-05-04 DIAGNOSIS — M6281 Muscle weakness (generalized): Secondary | ICD-10-CM | POA: Diagnosis not present

## 2023-05-04 DIAGNOSIS — R262 Difficulty in walking, not elsewhere classified: Secondary | ICD-10-CM | POA: Diagnosis not present

## 2023-05-04 DIAGNOSIS — E785 Hyperlipidemia, unspecified: Secondary | ICD-10-CM | POA: Diagnosis not present

## 2023-05-04 DIAGNOSIS — J449 Chronic obstructive pulmonary disease, unspecified: Secondary | ICD-10-CM | POA: Diagnosis not present

## 2023-05-04 DIAGNOSIS — I10 Essential (primary) hypertension: Secondary | ICD-10-CM | POA: Diagnosis not present

## 2023-05-04 DIAGNOSIS — I639 Cerebral infarction, unspecified: Secondary | ICD-10-CM | POA: Diagnosis not present

## 2023-05-04 MED ORDER — MIRTAZAPINE 15 MG PO TABS: 15.0000 mg | ORAL_TABLET | Freq: Every day | ORAL | 2 refills | Status: DC

## 2023-05-08 DIAGNOSIS — I639 Cerebral infarction, unspecified: Secondary | ICD-10-CM | POA: Diagnosis not present

## 2023-05-08 DIAGNOSIS — E785 Hyperlipidemia, unspecified: Secondary | ICD-10-CM | POA: Diagnosis not present

## 2023-05-08 DIAGNOSIS — M6281 Muscle weakness (generalized): Secondary | ICD-10-CM | POA: Diagnosis not present

## 2023-05-08 DIAGNOSIS — R262 Difficulty in walking, not elsewhere classified: Secondary | ICD-10-CM | POA: Diagnosis not present

## 2023-05-08 DIAGNOSIS — J449 Chronic obstructive pulmonary disease, unspecified: Secondary | ICD-10-CM | POA: Diagnosis not present

## 2023-05-08 DIAGNOSIS — I10 Essential (primary) hypertension: Secondary | ICD-10-CM | POA: Diagnosis not present

## 2023-05-09 ENCOUNTER — Other Ambulatory Visit: Payer: Self-pay | Admitting: Nurse Practitioner

## 2023-05-10 DIAGNOSIS — I639 Cerebral infarction, unspecified: Secondary | ICD-10-CM | POA: Diagnosis not present

## 2023-05-10 DIAGNOSIS — J449 Chronic obstructive pulmonary disease, unspecified: Secondary | ICD-10-CM | POA: Diagnosis not present

## 2023-05-10 DIAGNOSIS — M6281 Muscle weakness (generalized): Secondary | ICD-10-CM | POA: Diagnosis not present

## 2023-05-10 DIAGNOSIS — I10 Essential (primary) hypertension: Secondary | ICD-10-CM | POA: Diagnosis not present

## 2023-05-10 DIAGNOSIS — E785 Hyperlipidemia, unspecified: Secondary | ICD-10-CM | POA: Diagnosis not present

## 2023-05-10 DIAGNOSIS — R262 Difficulty in walking, not elsewhere classified: Secondary | ICD-10-CM | POA: Diagnosis not present

## 2023-05-11 DIAGNOSIS — J449 Chronic obstructive pulmonary disease, unspecified: Secondary | ICD-10-CM | POA: Diagnosis not present

## 2023-05-11 DIAGNOSIS — I10 Essential (primary) hypertension: Secondary | ICD-10-CM | POA: Diagnosis not present

## 2023-05-11 DIAGNOSIS — R262 Difficulty in walking, not elsewhere classified: Secondary | ICD-10-CM | POA: Diagnosis not present

## 2023-05-11 DIAGNOSIS — E785 Hyperlipidemia, unspecified: Secondary | ICD-10-CM | POA: Diagnosis not present

## 2023-05-11 DIAGNOSIS — M6281 Muscle weakness (generalized): Secondary | ICD-10-CM | POA: Diagnosis not present

## 2023-05-11 DIAGNOSIS — I639 Cerebral infarction, unspecified: Secondary | ICD-10-CM | POA: Diagnosis not present

## 2023-05-14 DIAGNOSIS — I10 Essential (primary) hypertension: Secondary | ICD-10-CM | POA: Diagnosis not present

## 2023-05-14 DIAGNOSIS — E785 Hyperlipidemia, unspecified: Secondary | ICD-10-CM | POA: Diagnosis not present

## 2023-05-14 DIAGNOSIS — R262 Difficulty in walking, not elsewhere classified: Secondary | ICD-10-CM | POA: Diagnosis not present

## 2023-05-14 DIAGNOSIS — J449 Chronic obstructive pulmonary disease, unspecified: Secondary | ICD-10-CM | POA: Diagnosis not present

## 2023-05-14 DIAGNOSIS — M6281 Muscle weakness (generalized): Secondary | ICD-10-CM | POA: Diagnosis not present

## 2023-05-14 DIAGNOSIS — I639 Cerebral infarction, unspecified: Secondary | ICD-10-CM | POA: Diagnosis not present

## 2023-05-15 ENCOUNTER — Other Ambulatory Visit: Payer: Self-pay

## 2023-05-15 MED ORDER — CARVEDILOL 12.5 MG PO TABS: 12.5000 mg | ORAL_TABLET | Freq: Two times a day (BID) | ORAL | 1 refills | Status: DC

## 2023-05-15 MED ORDER — APIXABAN 5 MG PO TABS: 5.0000 mg | ORAL_TABLET | Freq: Two times a day (BID) | ORAL | 1 refills | Status: DC

## 2023-05-15 MED ORDER — ANORO ELLIPTA 62.5-25 MCG/ACT IN AEPB: 1.0000 | INHALATION_SPRAY | RESPIRATORY_TRACT | 1 refills | Status: DC | PRN

## 2023-05-15 MED ORDER — ATORVASTATIN CALCIUM 80 MG PO TABS: 80.0000 mg | ORAL_TABLET | Freq: Every day | ORAL | 1 refills | Status: DC

## 2023-05-16 DIAGNOSIS — J449 Chronic obstructive pulmonary disease, unspecified: Secondary | ICD-10-CM | POA: Diagnosis not present

## 2023-05-16 DIAGNOSIS — I639 Cerebral infarction, unspecified: Secondary | ICD-10-CM | POA: Diagnosis not present

## 2023-05-16 DIAGNOSIS — M6281 Muscle weakness (generalized): Secondary | ICD-10-CM | POA: Diagnosis not present

## 2023-05-16 DIAGNOSIS — R262 Difficulty in walking, not elsewhere classified: Secondary | ICD-10-CM | POA: Diagnosis not present

## 2023-05-16 DIAGNOSIS — E785 Hyperlipidemia, unspecified: Secondary | ICD-10-CM | POA: Diagnosis not present

## 2023-05-16 DIAGNOSIS — I10 Essential (primary) hypertension: Secondary | ICD-10-CM | POA: Diagnosis not present

## 2023-05-18 DIAGNOSIS — E785 Hyperlipidemia, unspecified: Secondary | ICD-10-CM | POA: Diagnosis not present

## 2023-05-18 DIAGNOSIS — J449 Chronic obstructive pulmonary disease, unspecified: Secondary | ICD-10-CM | POA: Diagnosis not present

## 2023-05-18 DIAGNOSIS — I639 Cerebral infarction, unspecified: Secondary | ICD-10-CM | POA: Diagnosis not present

## 2023-05-18 DIAGNOSIS — M6281 Muscle weakness (generalized): Secondary | ICD-10-CM | POA: Diagnosis not present

## 2023-05-18 DIAGNOSIS — I10 Essential (primary) hypertension: Secondary | ICD-10-CM | POA: Diagnosis not present

## 2023-05-18 DIAGNOSIS — R262 Difficulty in walking, not elsewhere classified: Secondary | ICD-10-CM | POA: Diagnosis not present

## 2023-05-21 ENCOUNTER — Other Ambulatory Visit: Payer: Self-pay | Admitting: Emergency Medicine

## 2023-05-21 DIAGNOSIS — R262 Difficulty in walking, not elsewhere classified: Secondary | ICD-10-CM | POA: Diagnosis not present

## 2023-05-21 DIAGNOSIS — I10 Essential (primary) hypertension: Secondary | ICD-10-CM | POA: Diagnosis not present

## 2023-05-21 DIAGNOSIS — E785 Hyperlipidemia, unspecified: Secondary | ICD-10-CM | POA: Diagnosis not present

## 2023-05-21 DIAGNOSIS — I639 Cerebral infarction, unspecified: Secondary | ICD-10-CM | POA: Diagnosis not present

## 2023-05-21 DIAGNOSIS — M6281 Muscle weakness (generalized): Secondary | ICD-10-CM | POA: Diagnosis not present

## 2023-05-21 DIAGNOSIS — J449 Chronic obstructive pulmonary disease, unspecified: Secondary | ICD-10-CM | POA: Diagnosis not present

## 2023-05-22 DIAGNOSIS — J449 Chronic obstructive pulmonary disease, unspecified: Secondary | ICD-10-CM | POA: Diagnosis not present

## 2023-05-22 DIAGNOSIS — M6281 Muscle weakness (generalized): Secondary | ICD-10-CM | POA: Diagnosis not present

## 2023-05-22 DIAGNOSIS — I639 Cerebral infarction, unspecified: Secondary | ICD-10-CM | POA: Diagnosis not present

## 2023-05-22 DIAGNOSIS — E785 Hyperlipidemia, unspecified: Secondary | ICD-10-CM | POA: Diagnosis not present

## 2023-05-22 DIAGNOSIS — I10 Essential (primary) hypertension: Secondary | ICD-10-CM | POA: Diagnosis not present

## 2023-05-22 DIAGNOSIS — R262 Difficulty in walking, not elsewhere classified: Secondary | ICD-10-CM | POA: Diagnosis not present

## 2023-05-29 DIAGNOSIS — J449 Chronic obstructive pulmonary disease, unspecified: Secondary | ICD-10-CM | POA: Diagnosis not present

## 2023-05-29 DIAGNOSIS — I422 Other hypertrophic cardiomyopathy: Secondary | ICD-10-CM | POA: Diagnosis not present

## 2023-05-29 DIAGNOSIS — I10 Essential (primary) hypertension: Secondary | ICD-10-CM | POA: Diagnosis not present

## 2023-05-29 DIAGNOSIS — I639 Cerebral infarction, unspecified: Secondary | ICD-10-CM | POA: Diagnosis not present

## 2023-05-29 DIAGNOSIS — M6281 Muscle weakness (generalized): Secondary | ICD-10-CM | POA: Diagnosis not present

## 2023-05-29 DIAGNOSIS — R262 Difficulty in walking, not elsewhere classified: Secondary | ICD-10-CM | POA: Diagnosis not present

## 2023-05-29 DIAGNOSIS — E785 Hyperlipidemia, unspecified: Secondary | ICD-10-CM | POA: Diagnosis not present

## 2023-06-05 ENCOUNTER — Telehealth: Payer: Self-pay

## 2023-06-05 DIAGNOSIS — I10 Essential (primary) hypertension: Secondary | ICD-10-CM | POA: Diagnosis not present

## 2023-06-05 DIAGNOSIS — J449 Chronic obstructive pulmonary disease, unspecified: Secondary | ICD-10-CM | POA: Diagnosis not present

## 2023-06-05 DIAGNOSIS — R262 Difficulty in walking, not elsewhere classified: Secondary | ICD-10-CM | POA: Diagnosis not present

## 2023-06-05 DIAGNOSIS — E785 Hyperlipidemia, unspecified: Secondary | ICD-10-CM | POA: Diagnosis not present

## 2023-06-05 DIAGNOSIS — I639 Cerebral infarction, unspecified: Secondary | ICD-10-CM | POA: Diagnosis not present

## 2023-06-05 DIAGNOSIS — M6281 Muscle weakness (generalized): Secondary | ICD-10-CM | POA: Diagnosis not present

## 2023-06-05 NOTE — Telephone Encounter (Signed)
Patients physical therapist reached out to [place] stating her BP was high and she was dizzy. Daughter was called to verify - daughter stated she fell yesterday but she is unsure of she hit her head. Patient was advised since she fell yesterday and is having dizziness today and is unsure of hitting her head she should go to the ER. Patients daughter stated okay!

## 2023-06-06 ENCOUNTER — Other Ambulatory Visit: Payer: Self-pay | Admitting: Nurse Practitioner

## 2023-06-06 DIAGNOSIS — E509 Vitamin A deficiency, unspecified: Secondary | ICD-10-CM

## 2023-06-07 DIAGNOSIS — E785 Hyperlipidemia, unspecified: Secondary | ICD-10-CM | POA: Diagnosis not present

## 2023-06-07 DIAGNOSIS — R262 Difficulty in walking, not elsewhere classified: Secondary | ICD-10-CM | POA: Diagnosis not present

## 2023-06-07 DIAGNOSIS — I10 Essential (primary) hypertension: Secondary | ICD-10-CM | POA: Diagnosis not present

## 2023-06-07 DIAGNOSIS — J449 Chronic obstructive pulmonary disease, unspecified: Secondary | ICD-10-CM | POA: Diagnosis not present

## 2023-06-07 DIAGNOSIS — I639 Cerebral infarction, unspecified: Secondary | ICD-10-CM | POA: Diagnosis not present

## 2023-06-07 DIAGNOSIS — M6281 Muscle weakness (generalized): Secondary | ICD-10-CM | POA: Diagnosis not present

## 2023-06-12 DIAGNOSIS — I10 Essential (primary) hypertension: Secondary | ICD-10-CM | POA: Diagnosis not present

## 2023-06-12 DIAGNOSIS — M6281 Muscle weakness (generalized): Secondary | ICD-10-CM | POA: Diagnosis not present

## 2023-06-12 DIAGNOSIS — R262 Difficulty in walking, not elsewhere classified: Secondary | ICD-10-CM | POA: Diagnosis not present

## 2023-06-12 DIAGNOSIS — E785 Hyperlipidemia, unspecified: Secondary | ICD-10-CM | POA: Diagnosis not present

## 2023-06-12 DIAGNOSIS — I639 Cerebral infarction, unspecified: Secondary | ICD-10-CM | POA: Diagnosis not present

## 2023-06-12 DIAGNOSIS — J449 Chronic obstructive pulmonary disease, unspecified: Secondary | ICD-10-CM | POA: Diagnosis not present

## 2023-06-14 DIAGNOSIS — M6281 Muscle weakness (generalized): Secondary | ICD-10-CM | POA: Diagnosis not present

## 2023-06-14 DIAGNOSIS — R262 Difficulty in walking, not elsewhere classified: Secondary | ICD-10-CM | POA: Diagnosis not present

## 2023-06-14 DIAGNOSIS — I10 Essential (primary) hypertension: Secondary | ICD-10-CM | POA: Diagnosis not present

## 2023-06-14 DIAGNOSIS — J449 Chronic obstructive pulmonary disease, unspecified: Secondary | ICD-10-CM | POA: Diagnosis not present

## 2023-06-14 DIAGNOSIS — I639 Cerebral infarction, unspecified: Secondary | ICD-10-CM | POA: Diagnosis not present

## 2023-06-14 DIAGNOSIS — E785 Hyperlipidemia, unspecified: Secondary | ICD-10-CM | POA: Diagnosis not present

## 2023-06-15 ENCOUNTER — Other Ambulatory Visit: Payer: Self-pay | Admitting: Emergency Medicine

## 2023-06-19 DIAGNOSIS — J449 Chronic obstructive pulmonary disease, unspecified: Secondary | ICD-10-CM | POA: Diagnosis not present

## 2023-06-19 DIAGNOSIS — I10 Essential (primary) hypertension: Secondary | ICD-10-CM | POA: Diagnosis not present

## 2023-06-19 DIAGNOSIS — M6281 Muscle weakness (generalized): Secondary | ICD-10-CM | POA: Diagnosis not present

## 2023-06-19 DIAGNOSIS — I639 Cerebral infarction, unspecified: Secondary | ICD-10-CM | POA: Diagnosis not present

## 2023-06-19 DIAGNOSIS — E785 Hyperlipidemia, unspecified: Secondary | ICD-10-CM | POA: Diagnosis not present

## 2023-06-19 DIAGNOSIS — R262 Difficulty in walking, not elsewhere classified: Secondary | ICD-10-CM | POA: Diagnosis not present

## 2023-06-21 DIAGNOSIS — I639 Cerebral infarction, unspecified: Secondary | ICD-10-CM | POA: Diagnosis not present

## 2023-06-21 DIAGNOSIS — M6281 Muscle weakness (generalized): Secondary | ICD-10-CM | POA: Diagnosis not present

## 2023-06-21 DIAGNOSIS — R262 Difficulty in walking, not elsewhere classified: Secondary | ICD-10-CM | POA: Diagnosis not present

## 2023-06-21 DIAGNOSIS — J449 Chronic obstructive pulmonary disease, unspecified: Secondary | ICD-10-CM | POA: Diagnosis not present

## 2023-06-21 DIAGNOSIS — I10 Essential (primary) hypertension: Secondary | ICD-10-CM | POA: Diagnosis not present

## 2023-06-21 DIAGNOSIS — E785 Hyperlipidemia, unspecified: Secondary | ICD-10-CM | POA: Diagnosis not present

## 2023-06-26 DIAGNOSIS — M6281 Muscle weakness (generalized): Secondary | ICD-10-CM | POA: Diagnosis not present

## 2023-06-26 DIAGNOSIS — I639 Cerebral infarction, unspecified: Secondary | ICD-10-CM | POA: Diagnosis not present

## 2023-06-26 DIAGNOSIS — E785 Hyperlipidemia, unspecified: Secondary | ICD-10-CM | POA: Diagnosis not present

## 2023-06-26 DIAGNOSIS — I10 Essential (primary) hypertension: Secondary | ICD-10-CM | POA: Diagnosis not present

## 2023-06-26 DIAGNOSIS — R262 Difficulty in walking, not elsewhere classified: Secondary | ICD-10-CM | POA: Diagnosis not present

## 2023-06-26 DIAGNOSIS — J449 Chronic obstructive pulmonary disease, unspecified: Secondary | ICD-10-CM | POA: Diagnosis not present

## 2023-06-28 DIAGNOSIS — I10 Essential (primary) hypertension: Secondary | ICD-10-CM | POA: Diagnosis not present

## 2023-06-28 DIAGNOSIS — J449 Chronic obstructive pulmonary disease, unspecified: Secondary | ICD-10-CM | POA: Diagnosis not present

## 2023-06-28 DIAGNOSIS — E785 Hyperlipidemia, unspecified: Secondary | ICD-10-CM | POA: Diagnosis not present

## 2023-06-28 DIAGNOSIS — I639 Cerebral infarction, unspecified: Secondary | ICD-10-CM | POA: Diagnosis not present

## 2023-06-28 DIAGNOSIS — R262 Difficulty in walking, not elsewhere classified: Secondary | ICD-10-CM | POA: Diagnosis not present

## 2023-06-28 DIAGNOSIS — M6281 Muscle weakness (generalized): Secondary | ICD-10-CM | POA: Diagnosis not present

## 2023-06-29 DIAGNOSIS — I422 Other hypertrophic cardiomyopathy: Secondary | ICD-10-CM | POA: Diagnosis not present

## 2023-06-29 DIAGNOSIS — I639 Cerebral infarction, unspecified: Secondary | ICD-10-CM | POA: Diagnosis not present

## 2023-07-06 ENCOUNTER — Other Ambulatory Visit: Payer: Self-pay | Admitting: Emergency Medicine

## 2023-07-17 ENCOUNTER — Other Ambulatory Visit: Payer: Self-pay | Admitting: Nurse Practitioner

## 2023-07-27 ENCOUNTER — Encounter: Payer: Self-pay | Admitting: Podiatry

## 2023-07-27 ENCOUNTER — Ambulatory Visit (INDEPENDENT_AMBULATORY_CARE_PROVIDER_SITE_OTHER): Payer: 59 | Admitting: Podiatry

## 2023-07-27 DIAGNOSIS — M79675 Pain in left toe(s): Secondary | ICD-10-CM

## 2023-07-27 DIAGNOSIS — B351 Tinea unguium: Secondary | ICD-10-CM

## 2023-07-27 DIAGNOSIS — M79674 Pain in right toe(s): Secondary | ICD-10-CM | POA: Diagnosis not present

## 2023-07-27 DIAGNOSIS — Z8673 Personal history of transient ischemic attack (TIA), and cerebral infarction without residual deficits: Secondary | ICD-10-CM | POA: Diagnosis not present

## 2023-07-27 NOTE — Progress Notes (Signed)
This patient returns to the office for evaluation and treatment of long thick painful nails .  This patient is unable to trim his own nails since the patient cannot reach her feet.  Patient says the nails are painful walking and wearing his shoes.   She presents to the office with a caregiver. She returns for preventive foot care services.  General Appearance  Alert, conversant and in no acute stress.  Vascular  Dorsalis pedis and posterior tibial  pulses are palpable  bilaterally.  Capillary return is within normal limits  bilaterally. Temperature is within normal limits  bilaterally.  Neurologic  Senn-Weinstein monofilament wire test within normal limits  bilaterally. Muscle power within normal limits bilaterally.  Nails Thick disfigured discolored nails with subungual debris  from hallux to fifth toes bilaterally. No evidence of bacterial infection or drainage bilaterally.  Orthopedic  No limitations of motion  feet .  No crepitus or effusions noted.  No bony pathology .  Hammer toes  B/L.  Pes planus  B/L.  Skin  normotropic skin with no porokeratosis noted bilaterally.  No signs of infections or ulcers noted.     Onychomycosis  Pain in toes right foot  Pain in toes left foot  Debridement  of nails  1-5  B/L with a nail nipper.  Nails were then filed using a dremel tool with no incidents.    RTC  3  months    Helane Gunther DPM

## 2023-07-31 ENCOUNTER — Other Ambulatory Visit: Payer: Self-pay | Admitting: Emergency Medicine

## 2023-08-14 ENCOUNTER — Other Ambulatory Visit: Payer: Self-pay | Admitting: Cardiology

## 2023-08-21 ENCOUNTER — Encounter: Payer: Self-pay | Admitting: Nurse Practitioner

## 2023-09-06 ENCOUNTER — Other Ambulatory Visit: Payer: Self-pay | Admitting: Nurse Practitioner

## 2023-09-06 ENCOUNTER — Other Ambulatory Visit: Payer: Self-pay | Admitting: Emergency Medicine

## 2023-09-07 ENCOUNTER — Other Ambulatory Visit: Payer: Self-pay | Admitting: Nurse Practitioner

## 2023-09-21 ENCOUNTER — Ambulatory Visit (INDEPENDENT_AMBULATORY_CARE_PROVIDER_SITE_OTHER): Payer: 59

## 2023-09-21 DIAGNOSIS — Z0000 Encounter for general adult medical examination without abnormal findings: Secondary | ICD-10-CM

## 2023-09-21 NOTE — Patient Instructions (Signed)

## 2023-09-21 NOTE — Progress Notes (Signed)
 Subjective:   Kimberly [name] is a 68 y.o. female who presents for Medicare Annual (Subsequent) preventive examination.  Visit Complete: Virtual I connected with  Kimberly [name] on 09/21/23 by a audio enabled telemedicine application and verified that I am speaking with the correct person using two identifiers.  Patient Location: Home  Provider Location: Office/Clinic  I discussed the limitations of evaluation and management by telemedicine. The patient expressed understanding and agreed to proceed.  Vital Signs: Because this visit was a virtual/telehealth visit, some criteria may be missing or patient reported. Any vitals not documented were not able to be obtained and vitals that have been documented are patient reported.  Patient Medicare AWV questionnaire was completed by the patient on 09/21/2023; I have confirmed that all information answered by patient is correct and no changes since this date.        Objective:    There were no vitals filed for this visit. There is no height or weight on file to calculate BMI.     08/15/2022   12:45 PM 06/24/2022    9:00 PM 06/23/2022   11:04 AM 04/14/2021   12:38 PM 12/18/2020   12:44 PM 03/10/2020   10:39 AM 01/30/2020   10:57 AM  Advanced Directives  Does Patient Have a Medical Advance Directive? Yes No No No No No No  Does patient want to make changes to medical advance directive? Yes (MAU/Ambulatory/Procedural Areas - Information given)        Would patient like information on creating a medical advance directive?  Yes (Inpatient - patient requests chaplain consult to create a medical advance directive)   No - Patient declined  Yes (MAU/Ambulatory/Procedural Areas - Information given)    Current Medications (verified) Outpatient Encounter Medications as of 09/21/2023  Medication Sig   alendronate  (FOSAMAX ) 70 MG tablet TAKE 1 TABLET BY MOUTH EVERY 7 DAYS. Take WITH A full GLASS OF WATER ON EMPTY stomach   amLODipine  (NORVASC ) 5  MG tablet TAKE 1 TABLET BY MOUTH EVERY DAY   atorvastatin  (LIPITOR ) 80 MG tablet TAKE 1 TABLET BY MOUTH EVERY DAY   carvedilol  (COREG ) 12.5 MG tablet TAKE 1 TABLET BY MOUTH TWICE DAILY WITH MEALS   ELIQUIS  5 MG TABS tablet TAKE 1 TABLET BY MOUTH 2 TIMES DAILY   mirtazapine  (REMERON ) 15 MG tablet TAKE 1 TABLET BY MOUTH AT BEDTIME   pantoprazole  (PROTONIX ) 40 MG tablet TAKE 1 TABLET BY MOUTH EVERY DAY   Vitamin D , Ergocalciferol , (DRISDOL ) 1.25 MG (50000 UNIT) CAPS capsule TAKE 1 CAPSULE BY MOUTH TWICE A WEEK   albuterol  (VENTOLIN  HFA) 108 (90 Base) MCG/ACT inhaler INHALE 2 PUFFS BY MOUTH INTO THE LUNGS EVERY 6 HOURS AS NEEDED FOR WHEEZING OR SHORTNESS OF BREATH (Patient not taking: Reported on 09/21/2023)   ANORO ELLIPTA  62.5-25 MCG/ACT AEPB INHALE 1 PUFF BY MOUTH INTO THE LUNGS ONCE DAILY. RINSE MOUTH AFTER USE (Patient not taking: Reported on 09/21/2023)   fluticasone  (FLONASE ) 50 MCG/ACT nasal spray SPRAY 2 SPRAYS into EACH nostril DAILY (Patient not taking: Reported on 09/21/2023)   hydrocortisone cream 1 % Apply 1 application topically daily as needed for itching. (Patient not taking: Reported on 09/21/2023)   lidocaine  (LIDODERM ) 5 % APPLY 1 PATCH TO SKIN DAILY   nicotine  (NICODERM CQ  - DOSED IN MG/24 HOURS) 14 mg/24hr patch Place 1 patch (14 mg total) onto the skin daily. (Patient not taking: Reported on 09/21/2023)   No facility-administered encounter medications on file as of 09/21/2023.  Allergies (verified) Clonidine  derivatives and Spironolactone    History: Past Medical History:  Diagnosis Date   Bicipital tenosynovitis    Cerebral artery occlusion with cerebral infarction (HCC) 12/05/2007   Qualifier: Diagnosis of   By: Loretha MD, Myrical Andujo       Coronary artery disease 05/2007   [id number]    Fall 02/13/2018   Family history of ischemic heart disease    Gout    Hammer toe    Hip, thigh, leg, and ankle, insect bite, nonvenomous, without mention of infection(916.4)    Hyperlipidemia     Hypertension    Osteoporosis 02/07/2022   Other abnormality of red blood cells    Rotator cuff syndrome of left shoulder    Stroke [facility])    Past Surgical History:  Procedure Laterality Date   CARDIAC CATHETERIZATION     COLONOSCOPY WITH PROPOFOL  N/A 01/30/2020   Procedure: COLONOSCOPY WITH PROPOFOL ;  Surgeon: Rollin Dover, MD;  Location: [facility];  Service: Endoscopy;  Laterality: N/A;   CORONARY ARTERY BYPASS GRAFT     triple   CORONARY ARTERY BYPASS GRAFT  2008   POLYPECTOMY  01/30/2020   Procedure: POLYPECTOMY;  Surgeon: Rollin Dover, MD;  Location: [facility];  Service: Endoscopy;;   TUBAL LIGATION     Family History  Problem Relation Age of Onset   Hypertension Mother    Heart attack Father    Diabetes Father    Kidney failure Father    Anxiety disorder Sister    Sarcoidosis Sister    Social History   Socioeconomic History   Marital status: Single    Spouse name: Not on file   Number of children: 2   Years of education: Not on file   Highest education level: 12th grade  Occupational History   Occupation: retired  Tobacco Use   Smoking status: Former    Current packs/day: 0.00    Average packs/day: 2.0 packs/day for 35.0 years (70.0 ttl pk-yrs)    Types: Cigarettes    Start date: 08/11/1984    Quit date: 08/12/2019    Years since quitting: 4.1   Smokeless tobacco: Former    Quit date: 08/09/2008   Tobacco comments:    Stopped smoking in May 2022 [facility]   Vaping Use   Vaping status: Never Used  Substance and Sexual Activity   Alcohol use: Not Currently    Comment: ocassionally wine   Drug use: Not Currently    Types: Marijuana    Comment: history of cocaine use   Sexual activity: Not on file  Other Topics Concern   Not on file  Social History Narrative   Patient is right-handed. She lives alone in a one level home, 2 steps to enter. Her family is very active in her care.    Social Drivers of Corporate Investment Banker Strain: Low Risk   (09/21/2023)   Overall Financial Resource Strain (CARDIA)    Difficulty of Paying Living Expenses: Not very hard  Food Insecurity: No Food Insecurity (09/21/2023)   Hunger Vital Sign    Worried About Running Out of Food in the Last Year: Never true    Ran Out of Food in the Last Year: Never true  Transportation Needs: No Transportation Needs (09/21/2023)   PRAPARE - Administrator, Civil Service (Medical): No    Lack of Transportation (Non-Medical): No  Physical Activity: Unknown (09/21/2023)   Exercise Vital Sign    Days of Exercise per Week: 0 days  Minutes of Exercise per Session: Not on file  Stress: No Stress Concern Present (09/21/2023)   [employer] of Occupational Health - Occupational Stress Questionnaire    Feeling of Stress : Only a little  Social Connections: Socially Isolated (09/21/2023)   Social Connection and Isolation Panel [NHANES]    Frequency of Communication with Friends and Family: More than three times a week    Frequency of Social Gatherings with Friends and Family: Once a week    Attends Religious Services: Never    Database Administrator or Organizations: No    Attends Engineer, Structural: Not on file    Marital Status: Never married    Tobacco Counseling Counseling given: Not Answered Tobacco comments: Stopped smoking in May 2022 [facility]    Clinical Intake:  Pre-visit preparation completed: Yes  Pain : No/denies pain     Nutritional Risks: None Diabetes: No  How often do you need to have someone help you when you read instructions, pamphlets, or other written materials from your doctor or pharmacy?: 3 - Sometimes What is the last grade level you completed in school?: 12th grade  Interpreter Needed?: No  Information entered by :: Derrika Ruffalo h   Activities of Daily Living    09/21/2023   11:59 AM 09/21/2023   11:43 AM  In your present state of health, do you have any difficulty performing the following activities:  Hearing? 0 0   Vision? 1 1  Difficulty concentrating or making decisions? 1 1  Walking or climbing stairs? 1 1  Dressing or bathing? 1 1  Doing errands, shopping? 1 1  Preparing Food and eating ? Y Y  Using the Toilet? N N  In the past six months, have you accidently leaked urine? Y Y  Do you have problems with loss of bowel control? N N  Managing your Medications? Y Y  Managing your Finances? N N  Housekeeping or managing your Housekeeping? CINDERELLA CINDERELLA    Patient Care Team: Georgina Speaks, FNP as PCP - General (General Practice) Lavona Agent, MD as PCP - Cardiology (Cardiology) Edith, Debby BROCKS, MD (Cardiology) Skeet Juliene SAUNDERS, DO as Consulting Physician (Neurology) Pearson, Vallie J, [facility] (Inactive) (Pharmacist)  Indicate any recent Medical Services you may have received from other than [facility] providers in the past year (date may be approximate).     Assessment:   This is a routine wellness examination for Floris.  Hearing/Vision screen No results found.   Goals Addressed               This Visit's Progress     Continue To Strive To Be Healthy (pt-stated)        She wants to slowly but surely accomplish things.       Depression Screen    09/21/2023   12:05 PM 08/15/2022   12:12 PM 08/10/2021   10:39 AM 03/30/2020   10:59 AM 12/02/2019   12:17 PM 12/02/2019   11:49 AM 04/02/2019    9:11 AM  PHQ 2/9 Scores  PHQ - 2 Score 0 2 2 2 1  0 0  PHQ- 9 Score  7 11 16 6       Fall Risk    09/21/2023   12:08 PM 09/21/2023   11:43 AM 04/11/2023   11:45 AM 12/04/2022    2:27 PM 08/15/2022   12:13 PM  Fall Risk   Falls in the past year?   1 1 1   Number falls in past yr: 1  1 0 1 0  Injury with Fall? 0 0 0 0 1  Risk for fall due to : History of fall(s)  No Fall Risks History of fall(s);Impaired balance/gait;Impaired mobility History of fall(s)  Follow up   Falls evaluation completed  Falls evaluation completed    MEDICARE RISK AT HOME: Medicare Risk at Home Any stairs in or around the home?:  (Patient-Rptd) Yes If so, are there any without handrails?: (Patient-Rptd) Yes Home free of loose throw rugs in walkways, pet beds, electrical cords, etc?: (Patient-Rptd) Yes Adequate lighting in your home to reduce risk of falls?: (Patient-Rptd) Yes Life alert?: (Patient-Rptd) Yes Use of a cane, walker or w/c?: (Patient-Rptd) Yes Grab bars in the bathroom?: (Patient-Rptd) No Shower chair or bench in shower?: (Patient-Rptd) Yes Elevated toilet seat or a handicapped toilet?: (Patient-Rptd) Yes  Cognitive Function:        09/21/2023   12:08 PM 08/15/2022   12:22 PM  6CIT Screen  What Year? 0 points 0 points  What month? 0 points 0 points  What time? 0 points 3 points  Count back from 20 0 points 0 points  Months in reverse 4 points 2 points  Repeat phrase 4 points 8 points  Total Score 8 points 13 points    Immunizations Immunization History  Administered Date(s) Administered   Fluad Trivalent(High Dose 65+) 04/11/2023   Influenza Split 06/24/2016   Influenza,inj,Quad PF,6+ Mos 07/29/2013, 06/17/2017, 05/17/2020   Influenza-Unspecified 07/18/2021, 05/05/2022   Moderna SARS-COV2 Booster Vaccination 09/30/2020, 05/05/2022   Moderna Sars-Covid-2 Vaccination 01/23/2020, 02/20/2020   PNEUMOCOCCAL CONJUGATE-20 03/21/2022   Zoster Recombinant(Shingrix) 03/16/2021, 03/21/2022    TDAP status: Patient wishes to receive this vaccine today after disclosing that insurance does not cover the vaccine as a preventative vaccine.   Flu Vaccine status: Due, Education has been provided regarding the importance of this vaccine. Advised may receive this vaccine at local pharmacy or Health Dept. Aware to provide a copy of the vaccination record if obtained from local pharmacy or Health Dept. Verbalized acceptance and understanding.  Pneumococcal vaccine status: Up to date  Covid-19 vaccine status: Completed vaccines  Qualifies for Shingles Vaccine? Yes   Zostavax completed Yes   Shingrix  Completed?: Yes  Screening Tests Health Maintenance  Topic Date Due   DTaP/Tdap/Td (1 - Tdap) Never done   COVID-19 Vaccine (3 - Moderna risk series) 06/02/2022   Lung Cancer Screening  12/27/2022   Colonoscopy  01/30/2023   Medicare Annual Wellness (AWV)  09/20/2024   MAMMOGRAM  12/20/2024   Pneumonia Vaccine 69+ Years old  Completed   INFLUENZA VACCINE  Completed   DEXA SCAN  Completed   Hepatitis C Screening  Completed   Zoster Vaccines- Shingrix  Completed   HPV VACCINES  Aged Out    Health Maintenance  Health Maintenance Due  Topic Date Due   DTaP/Tdap/Td (1 - Tdap) Never done   COVID-19 Vaccine (3 - Moderna risk series) 06/02/2022   Lung Cancer Screening  12/27/2022   Colonoscopy  01/30/2023    Colorectal cancer screening: Type of screening: Colonoscopy. Completed 01/30/2020. Repeat every 3 years  Mammogram status: Completed 2. Repeat every year  Bone Density status: Completed 09/23/2021. Results reflect: Bone density results: OSTEOPENIA. Repeat every 2 years.  Additional Screening:  Hepatitis C Screening: does qualify; Completed 11/13/2019    Community Resource Referral / Chronic Care Management: CRR required this visit?  No   CCM required this visit?  No     Plan:  I have personally reviewed and noted the following in the patient's chart:   Medical and social history Use of alcohol, tobacco or illicit drugs  Current medications and supplements including opioid prescriptions. Patient is not currently taking opioid prescriptions. Functional ability and status Nutritional status Physical activity Advanced directives List of other physicians Hospitalizations, surgeries, and ER visits in previous 12 months Vitals Screenings to include cognitive, depression, and falls Referrals and appointments  In addition, I have reviewed and discussed with patient certain preventive protocols, quality metrics, and best practice recommendations. A written  personalized care plan for preventive services as well as general preventive health recommendations were provided to patient.     Richerd ONEIDA Lemmings, CMA   09/21/2023   After Visit Summary: (MyChart) Due to this being a telephonic visit, the after visit summary with patients personalized plan was offered to patient via MyChart   Nurse Notes: completed.

## 2023-09-26 ENCOUNTER — Encounter: Payer: 59 | Admitting: Nurse Practitioner

## 2023-10-09 ENCOUNTER — Ambulatory Visit (INDEPENDENT_AMBULATORY_CARE_PROVIDER_SITE_OTHER): Payer: 59 | Admitting: Nurse Practitioner

## 2023-10-09 ENCOUNTER — Encounter: Payer: Self-pay | Admitting: Nurse Practitioner

## 2023-10-09 ENCOUNTER — Other Ambulatory Visit: Payer: Self-pay | Admitting: Nurse Practitioner

## 2023-10-09 VITALS — BP 120/70 | HR 103 | Temp 98.5°F | Ht 67.0 in | Wt 139.2 lb

## 2023-10-09 DIAGNOSIS — I480 Paroxysmal atrial fibrillation: Secondary | ICD-10-CM | POA: Diagnosis not present

## 2023-10-09 DIAGNOSIS — Z1211 Encounter for screening for malignant neoplasm of colon: Secondary | ICD-10-CM | POA: Insufficient documentation

## 2023-10-09 DIAGNOSIS — Z87891 Personal history of nicotine dependence: Secondary | ICD-10-CM | POA: Insufficient documentation

## 2023-10-09 DIAGNOSIS — R7303 Prediabetes: Secondary | ICD-10-CM

## 2023-10-09 DIAGNOSIS — R600 Localized edema: Secondary | ICD-10-CM | POA: Diagnosis not present

## 2023-10-09 DIAGNOSIS — I10 Essential (primary) hypertension: Secondary | ICD-10-CM | POA: Diagnosis not present

## 2023-10-09 DIAGNOSIS — Z2821 Immunization not carried out because of patient refusal: Secondary | ICD-10-CM | POA: Insufficient documentation

## 2023-10-09 DIAGNOSIS — Z0000 Encounter for general adult medical examination without abnormal findings: Secondary | ICD-10-CM | POA: Insufficient documentation

## 2023-10-09 DIAGNOSIS — Z79899 Other long term (current) drug therapy: Secondary | ICD-10-CM

## 2023-10-09 DIAGNOSIS — Z993 Dependence on wheelchair: Secondary | ICD-10-CM | POA: Diagnosis not present

## 2023-10-09 DIAGNOSIS — I700 Atherosclerosis of aorta: Secondary | ICD-10-CM

## 2023-10-09 DIAGNOSIS — I69359 Hemiplegia and hemiparesis following cerebral infarction affecting unspecified side: Secondary | ICD-10-CM | POA: Diagnosis not present

## 2023-10-09 DIAGNOSIS — I119 Hypertensive heart disease without heart failure: Secondary | ICD-10-CM | POA: Diagnosis not present

## 2023-10-09 DIAGNOSIS — R0609 Other forms of dyspnea: Secondary | ICD-10-CM | POA: Diagnosis not present

## 2023-10-09 DIAGNOSIS — I272 Pulmonary hypertension, unspecified: Secondary | ICD-10-CM

## 2023-10-09 NOTE — Assessment & Plan Note (Signed)
 Blood pressure is well controlled, continue current medications.

## 2023-10-09 NOTE — Assessment & Plan Note (Signed)
 HgbA1c is stable. Will check levels today

## 2023-10-09 NOTE — Assessment & Plan Note (Signed)
 Will recheck CT lung low dose scan

## 2023-10-09 NOTE — Progress Notes (Signed)
 [name], CMA,acting as a Neurosurgeon for Kimberly Felts, FNP.,have documented all relevant documentation on the behalf of Kimberly Felts, FNP,as directed by  Kimberly Felts, FNP while in the presence of Kimberly Felts, FNP.  Subjective:    Patient ID: Kimberly [name] , female    DOB: 1956-01-11 , 68 y.o.   MRN: [id number]  Chief Complaint  Patient presents with   Annual Exam    HPI  Patient presents today for HM, Patient reports compliance with medication. Patient denies any chest pain, or headaches. Patient has no concerns today. She is having shortness of breath at rest and with exertion     Past Medical History:  Diagnosis Date   Bicipital tenosynovitis    Cerebral artery occlusion with cerebral infarction (HCC) 12/05/2007   Qualifier: Diagnosis of   By: Barbaraann Barthel MD, [place]       Coronary artery disease 05/2007   [id number]    Fall 02/13/2018   Family history of ischemic heart disease    Gout    Hammer toe    Hip, thigh, leg, and ankle, insect bite, nonvenomous, without mention of infection(916.4)    Hyperlipidemia    Hypertension    Osteoporosis 02/07/2022   Other abnormality of red blood cells    Rotator cuff syndrome of left shoulder    Stroke (HCC)      Family History  Problem Relation Age of Onset   Hypertension Mother    Heart attack Father    Diabetes Father    Kidney failure Father    Anxiety disorder Sister    Sarcoidosis Sister      Current Outpatient Medications:    alendronate (FOSAMAX) 70 MG tablet, TAKE 1 TABLET BY MOUTH EVERY 7 DAYS. Take WITH A full GLASS OF WATER ON EMPTY stomach, Disp: 12 tablet, Rfl: 2   amLODipine (NORVASC) 5 MG tablet, TAKE 1 TABLET BY MOUTH EVERY DAY, Disp: 90 tablet, Rfl: 2   atorvastatin (LIPITOR) 80 MG tablet, TAKE 1 TABLET BY MOUTH EVERY DAY, Disp: 30 tablet, Rfl: 1   carvedilol (COREG) 12.5 MG tablet, TAKE 1 TABLET BY MOUTH TWICE DAILY WITH MEALS, Disp: 60 tablet, Rfl: 1   ELIQUIS 5 MG TABS tablet, TAKE 1 TABLET BY MOUTH 2  TIMES DAILY, Disp: 60 tablet, Rfl: 1   lidocaine (LIDODERM) 5 %, APPLY 1 PATCH TO SKIN DAILY, Disp: 30 patch, Rfl: 2   mirtazapine (REMERON) 15 MG tablet, TAKE 1 TABLET BY MOUTH AT BEDTIME, Disp: 30 tablet, Rfl: 2   pantoprazole (PROTONIX) 40 MG tablet, TAKE 1 TABLET BY MOUTH EVERY DAY, Disp: 30 tablet, Rfl: 2   Vitamin D, Ergocalciferol, (DRISDOL) 1.25 MG (50000 UNIT) CAPS capsule, TAKE 1 CAPSULE BY MOUTH TWICE A WEEK, Disp: 24 capsule, Rfl: 1   albuterol (VENTOLIN HFA) 108 (90 Base) MCG/ACT inhaler, INHALE 2 PUFFS BY MOUTH INTO THE LUNGS EVERY 6 HOURS AS NEEDED FOR WHEEZING OR SHORTNESS OF BREATH (Patient not taking: Reported on 10/09/2023), Disp: 8.5 g, Rfl: 1   ANORO ELLIPTA 62.5-25 MCG/ACT AEPB, INHALE 1 PUFF BY MOUTH INTO THE LUNGS ONCE DAILY. RINSE MOUTH AFTER USE (Patient not taking: Reported on 10/09/2023), Disp: 60 each, Rfl: 1   fluticasone (FLONASE) 50 MCG/ACT nasal spray, SPRAY 2 SPRAYS into EACH nostril DAILY (Patient not taking: Reported on 10/09/2023), Disp: 16 g, Rfl: 1   hydrocortisone cream 1 %, Apply 1 application topically daily as needed for itching. (Patient not taking: Reported on 09/21/2023), Disp: , Rfl:    nicotine (  NICODERM CQ - DOSED IN MG/24 HOURS) 14 mg/24hr patch, Place 1 patch (14 mg total) onto the skin daily. (Patient not taking: Reported on 10/09/2023), Disp: 28 patch, Rfl: 1   Allergies  Allergen Reactions   Clonidine Derivatives Other (See Comments)    Patch causes scars   Spironolactone Rash      The patient states she uses post menopausal status for birth control.  Negative for Dysmenorrhea and Negative for Menorrhagia. Negative for: breast discharge, breast lump(s), breast pain and breast self exam. Associated symptoms include abnormal vaginal bleeding. Pertinent negatives include abnormal bleeding (hematology), anxiety, decreased libido, depression, difficulty falling sleep, dyspareunia, history of infertility, nocturia, sexual dysfunction, sleep disturbances,  urinary incontinence, urinary urgency, vaginal discharge and vaginal itching. Diet regular. The patient states her exercise level is minimal   . The patient's tobacco use is:  Social History   Tobacco Use  Smoking Status Former   Current packs/day: 0.00   Average packs/day: 2.0 packs/day for 35.0 years (70.0 ttl pk-yrs)   Types: Cigarettes   Start date: 08/11/1984   Quit date: 08/12/2019   Years since quitting: 4.1  Smokeless Tobacco Former   Quit date: 08/09/2008  Tobacco Comments   Stopped smoking in May 2022 [facility]   . She has been exposed to passive smoke. The patient's alcohol use is:  Social History   Substance and Sexual Activity  Alcohol Use Not Currently   Comment: ocassionally wine    Review of Systems  Constitutional: Negative.   HENT: Negative.    Eyes: Negative.   Respiratory: Negative.    Cardiovascular: Negative.   Gastrointestinal: Negative.   Endocrine: Negative.   Genitourinary: Negative.   Musculoskeletal: Negative.   Skin: Negative.   Allergic/Immunologic: Negative.   Neurological: Negative.   Hematological: Negative.   Psychiatric/Behavioral: Negative.       Today's Vitals   10/09/23 1539  BP: 120/70  Pulse: (!) 103  Temp: 98.5 F (36.9 C)  TempSrc: Oral  Weight: 139 lb 3.2 oz (63.1 kg)  Height: 5\' 7"  (1.702 m)  PainSc: 0-No pain   Body mass index is 21.8 kg/m.  Wt Readings from Last 3 Encounters:  10/09/23 139 lb 3.2 oz (63.1 kg)  04/11/23 128 lb (58.1 kg)  04/06/23 128 lb (58.1 kg)    Objective:  Physical Exam Vitals reviewed. Exam conducted with a chaperone present.  Constitutional:      General: She is not in acute distress.    Appearance: Normal appearance. She is well-developed.  HENT:     Head: Normocephalic and atraumatic.     Right Ear: Hearing, tympanic membrane, ear canal and external ear normal. There is no impacted cerumen.     Left Ear: Hearing, tympanic membrane, ear canal and external ear normal. There is no  impacted cerumen.     Nose: Nose normal.     Mouth/Throat:     Mouth: Mucous membranes are moist.  Eyes:     General: Lids are normal.     Extraocular Movements: Extraocular movements intact.     Conjunctiva/sclera: Conjunctivae normal.     Pupils: Pupils are equal, round, and reactive to light.     Funduscopic exam:    Right eye: No papilledema.        Left eye: No papilledema.  Neck:     Thyroid: No thyroid mass.     Vascular: No carotid bruit.  Cardiovascular:     Rate and Rhythm: Normal rate and regular rhythm.  Pulses: Normal pulses.     Heart sounds: Normal heart sounds. No murmur heard. Pulmonary:     Effort: Pulmonary effort is normal. No respiratory distress.     Breath sounds: Normal breath sounds. No wheezing.  Chest:     Chest wall: No mass.  Breasts:    Tanner Score is 5.     Right: Normal. No mass or tenderness.     Left: Normal. No mass or tenderness.  Abdominal:     General: Abdomen is flat. Bowel sounds are normal. There is no distension.     Palpations: Abdomen is soft.     Tenderness: There is no abdominal tenderness.  Genitourinary:    Tanner stage (genital): 5.     Labia:        Right: No rash.        Left: No rash.      Adnexa: Right adnexa normal and left adnexa normal.       Right: No tenderness.         Left: No tenderness.    Musculoskeletal:        General: Deformity (left upper arm with mild contracture to wrist) present. No swelling or tenderness. Normal range of motion.     Cervical back: Full passive range of motion without pain, normal range of motion and neck supple.     Right lower leg: No edema.     Left lower leg: No edema.     Comments: Left hemiparesis  Lymphadenopathy:     Upper Body:     Right upper body: No supraclavicular, axillary or pectoral adenopathy.     Left upper body: No supraclavicular, axillary or pectoral adenopathy.  Skin:    General: Skin is warm and dry.     Capillary Refill: Capillary refill takes less  than 2 seconds.  Neurological:     General: No focal deficit present.     Mental Status: She is alert and oriented to person, place, and time.     Cranial Nerves: No cranial nerve deficit.     Sensory: No sensory deficit.     Motor: No weakness.  Psychiatric:        Mood and Affect: Mood normal.        Behavior: Behavior normal.        Thought Content: Thought content normal.        Judgment: Judgment normal.         Assessment And Plan:     Encounter for annual health examination Assessment & Plan: Behavior modifications discussed and diet history reviewed.   Pt will continue to exercise regularly and modify diet with low GI, plant based foods and decrease intake of processed foods.  Recommend intake of daily multivitamin, Vitamin D, and calcium.  Recommend mammogram and colonoscopy for preventive screenings, as well as recommend immunizations that include influenza, TDAP, and Shingles    Colon cancer screening Assessment & Plan: According to USPTF Colorectal cancer Screening guidelines. Cologuard is recommended every 3 years, starting at age 26 years. Order for cologuard sent   Orders: -     Cologuard  COVID-19 vaccination declined Assessment & Plan: Declines covid 19 vaccine. Discussed risk of covid 70 and if she changes her mind about the vaccine to call the office. Education has been provided regarding the importance of this vaccine but patient still declined. Advised may receive this vaccine at local pharmacy or Health Dept.or vaccine clinic. Aware to provide a copy of the vaccination record  if obtained from local pharmacy or Health Dept.  Encouraged to take multivitamin, vitamin d, vitamin c and zinc to increase immune system. Aware can call office if would like to have vaccine here at office. Verbalized acceptance and understanding.    Wheelchair dependence Assessment & Plan: She is mostly in the wheelchair and it is becoming more difficult for her daughter to get  her to the office for her visits. Would like remote health and needs a new rollator walker.    Hypertensive heart disease without heart failure Assessment & Plan: Blood pressure is well controlled, continue current medications. EKG done with no significant changes from 03/2023   Dyspnea on exertion Assessment & Plan: No shortness of breath during visit, will check BNP. She does look "puffy" in the face.   Orders: -     Brain natriuretic peptide  Prediabetes Assessment & Plan: HgbA1c is stable. Will check levels today  Orders: -     Hemoglobin A1c  Essential hypertension Assessment & Plan: Blood pressure is well controlled, continue current medications.   Orders: -     EKG 12-Lead -     Microalbumin / creatinine urine ratio -     Lipid panel  Aortic atherosclerosis (HCC) Assessment & Plan: Continue statin.   Orders: -     CMP14+EGFR  Hemiplegia and hemiparesis following cerebral infarction affecting unspecified side (HCC)  Pulmonary hypertension, unspecified (HCC) Assessment & Plan: Stable.    Paroxysmal atrial fibrillation [facility]) Assessment & Plan: Continue current medications.    Other long term (current) drug therapy -     CBC with Differential/Platelet  History of smoking 25-50 pack years Assessment & Plan: Will recheck CT lung low dose scan  Orders: -     CT CHEST LUNG CANCER SCREENING LOW DOSE WO CONTRAST; Future  Lower extremity edema Assessment & Plan: Trace - 1+ pitting edema to lower extremities.     Return for 1 year physical, 6 month bp check.  Patient was given opportunity to ask questions. Patient verbalized understanding of the plan and was able to repeat key elements of the plan. All questions were answered to their satisfaction.   Kimberly Felts, FNP  I, Kimberly Felts, FNP, have reviewed all documentation for this visit. The documentation on 10/09/23 for the exam, diagnosis, procedures, and orders are all accurate and complete.

## 2023-10-09 NOTE — Assessment & Plan Note (Signed)
 Continue current medications.

## 2023-10-09 NOTE — Assessment & Plan Note (Signed)
 According to USPTF Colorectal cancer Screening guidelines. Cologuard is recommended every 3 years, starting at age 68 years. Order for cologuard sent

## 2023-10-09 NOTE — Assessment & Plan Note (Addendum)
 No shortness of breath during visit, will check BNP. She does look "puffy" in the face.

## 2023-10-09 NOTE — Assessment & Plan Note (Signed)

## 2023-10-09 NOTE — Assessment & Plan Note (Signed)
 Trace - 1+ pitting edema to lower extremities.

## 2023-10-09 NOTE — Assessment & Plan Note (Addendum)
 Blood pressure is well controlled, continue current medications. EKG done with no significant changes from 03/2023

## 2023-10-09 NOTE — Assessment & Plan Note (Signed)
 She is mostly in the wheelchair and it is becoming more difficult for her daughter to get her to the office for her visits. Would like remote health and needs a new rollator walker.

## 2023-10-09 NOTE — Assessment & Plan Note (Signed)

## 2023-10-09 NOTE — Assessment & Plan Note (Signed)
 Stable

## 2023-10-09 NOTE — Assessment & Plan Note (Signed)
 Continue statin.

## 2023-10-10 ENCOUNTER — Other Ambulatory Visit: Payer: Self-pay | Admitting: Nurse Practitioner

## 2023-10-10 ENCOUNTER — Other Ambulatory Visit (INDEPENDENT_AMBULATORY_CARE_PROVIDER_SITE_OTHER): Payer: Self-pay | Admitting: Nurse Practitioner

## 2023-10-10 DIAGNOSIS — I10 Essential (primary) hypertension: Secondary | ICD-10-CM

## 2023-10-10 LAB — POCT URINALYSIS DIP (CLINITEK)
Bilirubin, UA: NEGATIVE
Blood, UA: NEGATIVE
Glucose, UA: NEGATIVE mg/dL
Ketones, POC UA: NEGATIVE mg/dL
Leukocytes, UA: NEGATIVE
Nitrite, UA: NEGATIVE
POC PROTEIN,UA: NEGATIVE
Spec Grav, UA: 1.015 (ref 1.010–1.025)
Urobilinogen, UA: 0.2 U/dL
pH, UA: 7 (ref 5.0–8.0)

## 2023-10-10 LAB — BRAIN NATRIURETIC PEPTIDE: BNP: 347.8 pg/mL — ABNORMAL HIGH (ref 0.0–100.0)

## 2023-10-10 LAB — CBC WITH DIFFERENTIAL/PLATELET
Basophils Absolute: 0.1 10*3/uL (ref 0.0–0.2)
Basos: 1 %
EOS (ABSOLUTE): 0.2 10*3/uL (ref 0.0–0.4)
Eos: 3 %
Hematocrit: 40.9 % (ref 34.0–46.6)
Hemoglobin: 12 g/dL (ref 11.1–15.9)
Immature Grans (Abs): 0 10*3/uL (ref 0.0–0.1)
Immature Granulocytes: 0 %
Lymphocytes Absolute: 2.8 10*3/uL (ref 0.7–3.1)
Lymphs: 48 %
MCH: 22.1 pg — ABNORMAL LOW (ref 26.6–33.0)
MCHC: 29.3 g/dL — ABNORMAL LOW (ref 31.5–35.7)
MCV: 75 fL — ABNORMAL LOW (ref 79–97)
Monocytes Absolute: 0.5 10*3/uL (ref 0.1–0.9)
Monocytes: 8 %
Neutrophils Absolute: 2.3 10*3/uL (ref 1.4–7.0)
Neutrophils: 40 %
Platelets: 258 10*3/uL (ref 150–450)
RBC: 5.44 x10E6/uL — ABNORMAL HIGH (ref 3.77–5.28)
RDW: 15 % (ref 11.7–15.4)
WBC: 5.8 10*3/uL (ref 3.4–10.8)

## 2023-10-10 LAB — CMP14+EGFR
ALT: 18 [IU]/L (ref 0–32)
AST: 19 [IU]/L (ref 0–40)
Albumin: 4.4 g/dL (ref 3.9–4.9)
Alkaline Phosphatase: 90 [IU]/L (ref 44–121)
BUN/Creatinine Ratio: 16 (ref 12–28)
BUN: 17 mg/dL (ref 8–27)
Bilirubin Total: 0.3 mg/dL (ref 0.0–1.2)
CO2: 21 mmol/L (ref 20–29)
Calcium: 9.5 mg/dL (ref 8.7–10.3)
Chloride: 108 mmol/L — ABNORMAL HIGH (ref 96–106)
Creatinine, Ser: 1.06 mg/dL — ABNORMAL HIGH (ref 0.57–1.00)
Globulin, Total: 2.4 g/dL (ref 1.5–4.5)
Glucose: 80 mg/dL (ref 70–99)
Potassium: 3.9 mmol/L (ref 3.5–5.2)
Sodium: 143 mmol/L (ref 134–144)
Total Protein: 6.8 g/dL (ref 6.0–8.5)
eGFR: 58 mL/min/{1.73_m2} — ABNORMAL LOW (ref >59)

## 2023-10-10 LAB — HEMOGLOBIN A1C
Est. average glucose Bld gHb Est-mCnc: 120 mg/dL
Hgb A1c MFr Bld: 5.8 % — ABNORMAL HIGH (ref 4.8–5.6)

## 2023-10-10 LAB — LIPID PANEL
Chol/HDL Ratio: 2.2 {ratio} (ref 0.0–4.4)
Cholesterol, Total: 137 mg/dL (ref 100–199)
HDL: 62 mg/dL (ref >39)
LDL Chol Calc (NIH): 53 mg/dL (ref 0–99)
Triglycerides: 123 mg/dL (ref 0–149)
VLDL Cholesterol Cal: 22 mg/dL (ref 5–40)

## 2023-10-11 ENCOUNTER — Other Ambulatory Visit: Payer: Self-pay | Admitting: Nurse Practitioner

## 2023-10-11 LAB — MICROALBUMIN / CREATININE URINE RATIO
Creatinine, Urine: 48.6 mg/dL
Microalb/Creat Ratio: 19 mg/g{creat} (ref 0–29)
Microalbumin, Urine: 9.1 ug/mL

## 2023-10-11 MED ORDER — FUROSEMIDE 20 MG PO TABS: ORAL_TABLET | ORAL | 0 refills | Status: DC

## 2023-10-19 DIAGNOSIS — I422 Other hypertrophic cardiomyopathy: Secondary | ICD-10-CM | POA: Diagnosis not present

## 2023-10-19 DIAGNOSIS — I69359 Hemiplegia and hemiparesis following cerebral infarction affecting unspecified side: Secondary | ICD-10-CM | POA: Diagnosis not present

## 2023-10-19 DIAGNOSIS — I639 Cerebral infarction, unspecified: Secondary | ICD-10-CM | POA: Diagnosis not present

## 2023-10-19 DIAGNOSIS — R2689 Other abnormalities of gait and mobility: Secondary | ICD-10-CM | POA: Diagnosis not present

## 2023-10-23 ENCOUNTER — Other Ambulatory Visit: Payer: Self-pay | Admitting: Nurse Practitioner

## 2023-10-23 DIAGNOSIS — Z1211 Encounter for screening for malignant neoplasm of colon: Secondary | ICD-10-CM | POA: Diagnosis not present

## 2023-10-23 DIAGNOSIS — E509 Vitamin A deficiency, unspecified: Secondary | ICD-10-CM

## 2023-10-25 ENCOUNTER — Other Ambulatory Visit: Payer: Self-pay

## 2023-10-26 ENCOUNTER — Ambulatory Visit: Payer: 59 | Admitting: Podiatry

## 2023-10-31 ENCOUNTER — Encounter: Payer: Self-pay | Admitting: Nurse Practitioner

## 2023-10-31 LAB — COLOGUARD: COLOGUARD: NEGATIVE

## 2023-11-01 ENCOUNTER — Other Ambulatory Visit: Payer: Self-pay | Admitting: Nurse Practitioner

## 2023-12-17 ENCOUNTER — Other Ambulatory Visit: Payer: Self-pay | Admitting: Nurse Practitioner

## 2024-01-03 ENCOUNTER — Other Ambulatory Visit: Payer: Self-pay | Admitting: Nurse Practitioner

## 2024-01-23 ENCOUNTER — Other Ambulatory Visit: Payer: Self-pay | Admitting: Nurse Practitioner

## 2024-01-23 DIAGNOSIS — Z1231 Encounter for screening mammogram for malignant neoplasm of breast: Secondary | ICD-10-CM

## 2024-01-24 ENCOUNTER — Ambulatory Visit
Admission: RE | Admit: 2024-01-24 | Discharge: 2024-01-24 | Disposition: A | Discharge status: Home / Self Care | Source: Ambulatory Visit

## 2024-01-24 DIAGNOSIS — Z1231 Encounter for screening mammogram for malignant neoplasm of breast: Secondary | ICD-10-CM

## 2024-02-11 ENCOUNTER — Other Ambulatory Visit: Payer: Self-pay | Admitting: Nurse Practitioner

## 2024-03-18 ENCOUNTER — Ambulatory Visit: Payer: Self-pay

## 2024-03-18 NOTE — Telephone Encounter (Signed)
 Summary: cold like symptoms / rx req   The patient shares that they began to experience a summer cold yesterday 03/17/24 and would like to be prescribed something to treat their cold like symptoms. Please contact the patient further when possible        Spoke with patients daughter [name], who advised that patients symptoms have resolved. No needs at this time.

## 2024-03-20 ENCOUNTER — Ambulatory Visit: Payer: Self-pay | Admitting: Family Medicine

## 2024-03-24 ENCOUNTER — Other Ambulatory Visit: Payer: Self-pay | Admitting: Nurse Practitioner

## 2024-03-24 DIAGNOSIS — E509 Vitamin A deficiency, unspecified: Secondary | ICD-10-CM

## 2024-03-28 ENCOUNTER — Ambulatory Visit: Payer: Self-pay | Admitting: Family Medicine

## 2024-04-02 ENCOUNTER — Ambulatory Visit (INDEPENDENT_AMBULATORY_CARE_PROVIDER_SITE_OTHER): Payer: Self-pay | Admitting: Family Medicine

## 2024-04-02 ENCOUNTER — Encounter: Payer: Self-pay | Admitting: Family Medicine

## 2024-04-02 VITALS — BP 110/80 | HR 79 | Temp 97.6°F | Resp 17 | Ht 67.0 in | Wt 130.0 lb

## 2024-04-02 DIAGNOSIS — I10 Essential (primary) hypertension: Secondary | ICD-10-CM

## 2024-04-02 DIAGNOSIS — I272 Pulmonary hypertension, unspecified: Secondary | ICD-10-CM

## 2024-04-02 DIAGNOSIS — Z79899 Other long term (current) drug therapy: Secondary | ICD-10-CM

## 2024-04-02 DIAGNOSIS — I129 Hypertensive chronic kidney disease with stage 1 through stage 4 chronic kidney disease, or unspecified chronic kidney disease: Secondary | ICD-10-CM | POA: Diagnosis not present

## 2024-04-02 DIAGNOSIS — I480 Paroxysmal atrial fibrillation: Secondary | ICD-10-CM

## 2024-04-02 DIAGNOSIS — N1831 Chronic kidney disease, stage 3a: Secondary | ICD-10-CM

## 2024-04-02 DIAGNOSIS — Z9989 Dependence on other enabling machines and devices: Secondary | ICD-10-CM | POA: Diagnosis not present

## 2024-04-02 DIAGNOSIS — D6869 Other thrombophilia: Secondary | ICD-10-CM

## 2024-04-02 DIAGNOSIS — J449 Chronic obstructive pulmonary disease, unspecified: Secondary | ICD-10-CM

## 2024-04-02 DIAGNOSIS — R0981 Nasal congestion: Secondary | ICD-10-CM

## 2024-04-02 DIAGNOSIS — Z87891 Personal history of nicotine dependence: Secondary | ICD-10-CM

## 2024-04-02 DIAGNOSIS — I69359 Hemiplegia and hemiparesis following cerebral infarction affecting unspecified side: Secondary | ICD-10-CM | POA: Diagnosis not present

## 2024-04-02 DIAGNOSIS — R051 Acute cough: Secondary | ICD-10-CM | POA: Diagnosis not present

## 2024-04-02 DIAGNOSIS — J069 Acute upper respiratory infection, unspecified: Secondary | ICD-10-CM | POA: Insufficient documentation

## 2024-04-02 DIAGNOSIS — I119 Hypertensive heart disease without heart failure: Secondary | ICD-10-CM

## 2024-04-02 DIAGNOSIS — I700 Atherosclerosis of aorta: Secondary | ICD-10-CM | POA: Diagnosis not present

## 2024-04-02 DIAGNOSIS — R7303 Prediabetes: Secondary | ICD-10-CM | POA: Diagnosis not present

## 2024-04-02 LAB — POC COVID19 BINAXNOW: SARS Coronavirus 2 Ag: NEGATIVE

## 2024-04-02 MED ORDER — BENZONATATE 100 MG PO CAPS: 100.0000 mg | ORAL_CAPSULE | Freq: Three times a day (TID) | ORAL | 0 refills | Status: AC | PRN

## 2024-04-02 NOTE — Progress Notes (Signed)
 I,Jameka J Llittleton, CMA,acting as a Neurosurgeon for [employer], NP.,have documented all relevant documentation on the behalf of Bruna Creighton, NP,as directed by  Bruna Creighton, NP while in the presence of Bruna Creighton, NP.  Subjective:  Patient ID: Kimberly [name] , female    DOB: [birth year] , 68 y.o.   MRN: [id number]  Chief Complaint  Patient presents with   URI    Patient presents today for cold symptoms. She reports she has been having a runny nose and cough. Patient also complains of back pain.    Hypertension    Patient presents today for a PRE DM and BP follow up, patient reports compliance with medications. Patient denies any chest pain, SOB, or headaches.  Patient is with her daughter today.     HPI Discussed the use of AI scribe software for clinical note transcription with the patient, who gave verbal consent to proceed.  History of Present Illness      Kimberly [name] is a 68 year old female with atrial fibrillation who presents with a cough and cold symptoms. She is accompanied by her daughter, [name].  She has been experiencing a dry cough for approximately a week and a half, with no sputum production. There is an occasional itchy throat but no sore throat. She has not taken any new medications for these symptoms and continues with her regular medications.  She feels achy, which she attributes to her usual arthritis, and reports no fever. She has a clear runny nose without any colored discharge.  She has a history of atrial fibrillation and is currently on Coreg  and Eliquis . She experiences ongoing shortness of breath. She uses an albuterol  inhaler as needed for shortness of breath and mentions needing refills for her inhaler.  She smokes cigarettes occasionally, obtaining them from others, and has not considered quitting smoking recently.  Patient  had a stroke in the past and has hemiparesis of her left side, she uses a wheel chair. Patient has chronic kidney disease stage  3. She advised  to take all medications as prescribed and avoid any possible nephrotoxic medications .  Hypertension This is a chronic problem. The current episode started more than 1 year ago. The problem has been gradually worsening since onset. The problem is uncontrolled. There are no associated agents to hypertension. Risk factors for coronary artery disease include smoking/tobacco exposure and post-menopausal state. Past treatments include calcium  channel blockers and diuretics. The current treatment provides significant improvement. There are no compliance problems.  Hypertensive end-organ damage includes kidney disease and CVA.     Past Medical History:  Diagnosis Date   Bicipital tenosynovitis    Cerebral artery occlusion with cerebral infarction (HCC) 12/05/2007   Qualifier: Diagnosis of   By: Loretha MD, [place]       Coronary artery disease 05/2007   [id number]    Fall 02/13/2018   Family history of ischemic heart disease    Gout    Hammer toe    Hip, thigh, leg, and ankle, insect bite, nonvenomous, without mention of infection(916.4)    Hyperlipidemia    Hypertension    Osteoporosis 02/07/2022   Other abnormality of red blood cells    Rotator cuff syndrome of left shoulder    Stroke (HCC)      Family History  Problem Relation Age of Onset   Hypertension Mother    Heart attack Father    Diabetes Father    Kidney failure Father    Anxiety disorder Sister  Sarcoidosis Sister      Current Outpatient Medications:    albuterol  (VENTOLIN  HFA) 108 (90 Base) MCG/ACT inhaler, INHALE 2 PUFFS BY MOUTH INTO THE LUNGS EVERY 6 HOURS AS NEEDED FOR WHEEZING OR SHORTNESS OF BREATH, Disp: 8.5 g, Rfl: 1   alendronate  (FOSAMAX ) 70 MG tablet, TAKE 1 TABLET BY MOUTH EVERY 7 DAYS. Take WITH A full GLASS OF WATER ON EMPTY stomach, Disp: 12 tablet, Rfl: 1   amLODipine  (NORVASC ) 5 MG tablet, TAKE 1 TABLET BY MOUTH EVERY DAY, Disp: 90 tablet, Rfl: 2   ANORO ELLIPTA  62.5-25 MCG/ACT AEPB,  INHALE 1 PUFF BY MOUTH INTO THE LUNGS ONCE DAILY. RINSE MOUTH AFTER USE, Disp: 60 each, Rfl: 1   benzonatate  (TESSALON  PERLES) 100 MG capsule, Take 1 capsule (100 mg total) by mouth 3 (three) times daily as needed., Disp: 30 capsule, Rfl: 0   carvedilol  (COREG ) 12.5 MG tablet, TAKE 1 TABLET BY MOUTH TWICE DAILY WITH MEALS, Disp: 60 tablet, Rfl: 2   fluticasone  (FLONASE ) 50 MCG/ACT nasal spray, SPRAY 2 SPRAYS into EACH nostril DAILY, Disp: 16 g, Rfl: 1   furosemide  (LASIX ) 20 MG tablet, Take 1 tablet every other day for one week, Disp: 10 tablet, Rfl: 0   hydrocortisone cream 1 %, Apply 1 application topically daily as needed for itching., Disp: , Rfl:    lidocaine  (LIDODERM ) 5 %, APPLY 1 PATCH TO SKIN DAILY, Disp: 30 patch, Rfl: 2   mirtazapine  (REMERON ) 15 MG tablet, TAKE 1 TABLET BY MOUTH AT BEDTIME, Disp: 30 tablet, Rfl: 2   nicotine  (NICODERM CQ  - DOSED IN MG/24 HOURS) 14 mg/24hr patch, Place 1 patch (14 mg total) onto the skin daily., Disp: 28 patch, Rfl: 1   pantoprazole  (PROTONIX ) 40 MG tablet, TAKE 1 TABLET BY MOUTH EVERY DAY, Disp: 30 tablet, Rfl: 2   Vitamin D , Ergocalciferol , (DRISDOL ) 1.25 MG (50000 UNIT) CAPS capsule, TAKE 1 CAPSULE BY MOUTH TWICE A WEEK, Disp: 24 capsule, Rfl: 1   atorvastatin  (LIPITOR ) 80 MG tablet, TAKE 1 TABLET BY MOUTH EVERY DAY, Disp: 30 tablet, Rfl: 1   ELIQUIS  5 MG TABS tablet, TAKE 1 TABLET BY MOUTH 2 TIMES DAILY, Disp: 60 tablet, Rfl: 1   Ferric Maltol  (ACCRUFER ) 30 MG CAPS, Take 1 capsule (30 mg total) by mouth daily., Disp: 90 capsule, Rfl: 1   Allergies  Allergen Reactions   Clonidine  Derivatives Other (See Comments)    Patch causes scars   Spironolactone  Rash     Review of Systems  Constitutional: Negative.  Negative for chills and fever.  HENT: Negative.    Respiratory:  Positive for cough.   Cardiovascular: Negative.   Musculoskeletal:  Positive for back pain and gait problem.       Wheelchair dependence d/t left sided weakness     Today's  Vitals   04/02/24 1459  BP: 110/80  Pulse: 79  Temp: 97.6 F (36.4 C)  TempSrc: Oral  SpO2: 96%  Weight: 130 lb (59 kg)  Height: 5' 7 (1.702 m)  PainSc: 9   PainLoc: Back   Body mass index is 20.36 kg/m.  Wt Readings from Last 3 Encounters:  04/02/24 130 lb (59 kg)  10/09/23 139 lb 3.2 oz (63.1 kg)  04/11/23 128 lb (58.1 kg)    The ASCVD Risk score (Arnett DK, et al., 2019) failed to calculate for the following reasons:   Risk score cannot be calculated because patient has a medical history suggesting prior/existing ASCVD  Objective:  Physical Exam HENT:  Head: Normocephalic.  Cardiovascular:     Rate and Rhythm: Normal rate and regular rhythm.  Pulmonary:     Effort: Pulmonary effort is normal.     Breath sounds: No wheezing or rhonchi.  Neurological:     Mental Status: She is alert.         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 (HCC) Assessment & Plan: Encouraged to keep BP well controlled and avoid use of NSAIDs   Orders: -     CBC -     CMP14+EGFR  Prediabetes -     Hemoglobin A1c  Aortic atherosclerosis (HCC) Assessment & Plan: On statin therapy  Orders: -     Lipid panel  Paroxysmal atrial fibrillation (HCC) Assessment & Plan: On rate controlled on Coreg  12.5 mg BID    TOBACCO USE, QUIT -     CT CHEST LUNG CANCER SCREENING LOW DOSE WO CONTRAST; Future  Nasal congestion -     POC COVID-19 BinaxNow  Acquired thrombophilia (HCC) Assessment & Plan: On eliquis  5 mg BID d/t PAF   Acute cough -     Benzonatate ; Take 1 capsule (100 mg total) by mouth 3 (three) times daily as needed.  Dispense: 30 capsule; Refill: 0  Hemiparesis affecting nondominant 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 [facility]) Assessment & Plan: Left sided weakness s/p CVA, uses wheelchair.     Assessment & Plan Acute upper respiratory infection with cough Dry cough with itchy throat and clear nasal  discharge suggests viral etiology. COVID-19 considered due to local cases. - Administer COVID-19 test. - Prescribe Neoral for throat irritation and nasal drainage. - Prescribe cough medicine.  Atrial fibrillation, stable on therapy AFib well-controlled on Coreg  and Eliquis . No recent pulmonology follow-up.  Tobacco use Continues smoking despite health risks, indicating cessation challenges.   Return if symptoms worsen or fail to improve, for keep next appt.  Patient was given opportunity to ask questions. Patient verbalized understanding of the plan and was able to repeat key elements of the plan. All questions were answered to their satisfaction.    I, Bruna Creighton, NP, have reviewed all documentation for this visit. The documentation on 04/21/2024 for the exam, diagnosis, procedures, and orders are all accurate and complete.    IF YOU HAVE BEEN REFERRED TO A SPECIALIST, IT MAY TAKE 1-2 WEEKS TO SCHEDULE/PROCESS THE REFERRAL. IF YOU HAVE NOT HEARD FROM US /SPECIALIST IN TWO WEEKS, PLEASE GIVE US  A CALL AT [phone].

## 2024-04-03 LAB — CMP14+EGFR
ALT: 24 IU/L (ref 0–32)
AST: 21 IU/L (ref 0–40)
Albumin: 4.4 g/dL (ref 3.9–4.9)
Alkaline Phosphatase: 83 IU/L (ref 44–121)
BUN/Creatinine Ratio: 18 (ref 12–28)
BUN: 21 mg/dL (ref 8–27)
Bilirubin Total: 0.4 mg/dL (ref 0.0–1.2)
CO2: 21 mmol/L (ref 20–29)
Calcium: 9.8 mg/dL (ref 8.7–10.3)
Chloride: 106 mmol/L (ref 96–106)
Creatinine, Ser: 1.18 mg/dL — ABNORMAL HIGH (ref 0.57–1.00)
Globulin, Total: 2.2 g/dL (ref 1.5–4.5)
Glucose: 71 mg/dL (ref 70–99)
Potassium: 3.9 mmol/L (ref 3.5–5.2)
Sodium: 143 mmol/L (ref 134–144)
Total Protein: 6.6 g/dL (ref 6.0–8.5)
eGFR: 51 mL/min/1.73 — ABNORMAL LOW (ref >59)

## 2024-04-03 LAB — LIPID PANEL
Chol/HDL Ratio: 2.3 ratio (ref 0.0–4.4)
Cholesterol, Total: 118 mg/dL (ref 100–199)
HDL: 52 mg/dL (ref >39)
LDL Chol Calc (NIH): 48 mg/dL (ref 0–99)
Triglycerides: 92 mg/dL (ref 0–149)
VLDL Cholesterol Cal: 18 mg/dL (ref 5–40)

## 2024-04-03 LAB — CBC
Hematocrit: 37.9 % (ref 34.0–46.6)
Hemoglobin: 10.9 g/dL — ABNORMAL LOW (ref 11.1–15.9)
MCH: 22.5 pg — ABNORMAL LOW (ref 26.6–33.0)
MCHC: 28.8 g/dL — ABNORMAL LOW (ref 31.5–35.7)
MCV: 78 fL — ABNORMAL LOW (ref 79–97)
Platelets: 284 x10E3/uL (ref 150–450)
RBC: 4.84 x10E6/uL (ref 3.77–5.28)
RDW: 16.5 % — ABNORMAL HIGH (ref 11.7–15.4)
WBC: 5.8 x10E3/uL (ref 3.4–10.8)

## 2024-04-03 LAB — HEMOGLOBIN A1C
Est. average glucose Bld gHb Est-mCnc: 131 mg/dL
Hgb A1c MFr Bld: 6.2 % — ABNORMAL HIGH (ref 4.8–5.6)

## 2024-04-07 ENCOUNTER — Ambulatory Visit: Payer: 59 | Admitting: Nurse Practitioner

## 2024-04-16 ENCOUNTER — Other Ambulatory Visit: Payer: Self-pay | Admitting: Nurse Practitioner

## 2024-04-21 ENCOUNTER — Ambulatory Visit: Payer: Self-pay | Admitting: Family Medicine

## 2024-04-21 DIAGNOSIS — D6869 Other thrombophilia: Secondary | ICD-10-CM | POA: Insufficient documentation

## 2024-04-21 DIAGNOSIS — R0981 Nasal congestion: Secondary | ICD-10-CM | POA: Insufficient documentation

## 2024-04-21 DIAGNOSIS — I129 Hypertensive chronic kidney disease with stage 1 through stage 4 chronic kidney disease, or unspecified chronic kidney disease: Secondary | ICD-10-CM | POA: Insufficient documentation

## 2024-04-21 MED ORDER — ACCRUFER 30 MG PO CAPS: 1.0000 | ORAL_CAPSULE | Freq: Every day | ORAL | 1 refills | Status: AC

## 2024-04-21 NOTE — Assessment & Plan Note (Signed)
Encouraged to keep BP well controlled and avoid use of NSAIDs

## 2024-04-21 NOTE — Assessment & Plan Note (Signed)
 On rate controlled on Coreg  12.5 mg BID

## 2024-04-21 NOTE — Assessment & Plan Note (Signed)
On eliquis 5 mg BID d/t PAF.

## 2024-04-21 NOTE — Progress Notes (Signed)
 Iron levels are low. Sent once daily iron supplement , also eat foods that are rich in iron.A1c Kidney function is stable. A1c has increased to 6.2. Low carb diet advised.Cholesterol level are still normal.  Thank you!

## 2024-04-21 NOTE — Assessment & Plan Note (Signed)
 On statin therapy

## 2024-04-22 NOTE — Assessment & Plan Note (Signed)
 Left sided weakness s/p CVA, uses wheelchair.

## 2024-04-25 ENCOUNTER — Encounter (HOSPITAL_COMMUNITY): Payer: Self-pay

## 2024-04-25 ENCOUNTER — Emergency Department (HOSPITAL_COMMUNITY)

## 2024-04-25 ENCOUNTER — Other Ambulatory Visit: Payer: Self-pay

## 2024-04-25 ENCOUNTER — Inpatient Hospital Stay (HOSPITAL_COMMUNITY)
Admission: EM | Admit: 2024-04-25 | Discharge: 2024-05-03 | DRG: 871 | Disposition: A | Discharge status: Skilled Nursing Facility | Attending: Internal Medicine | Admitting: Internal Medicine

## 2024-04-25 DIAGNOSIS — I272 Pulmonary hypertension, unspecified: Secondary | ICD-10-CM | POA: Diagnosis not present

## 2024-04-25 DIAGNOSIS — R220 Localized swelling, mass and lump, head: Secondary | ICD-10-CM | POA: Diagnosis not present

## 2024-04-25 DIAGNOSIS — I422 Other hypertrophic cardiomyopathy: Secondary | ICD-10-CM | POA: Diagnosis present

## 2024-04-25 DIAGNOSIS — M109 Gout, unspecified: Secondary | ICD-10-CM | POA: Diagnosis present

## 2024-04-25 DIAGNOSIS — S0081XA Abrasion of other part of head, initial encounter: Secondary | ICD-10-CM | POA: Diagnosis present

## 2024-04-25 DIAGNOSIS — M6282 Rhabdomyolysis: Secondary | ICD-10-CM | POA: Diagnosis present

## 2024-04-25 DIAGNOSIS — M4854XA Collapsed vertebra, not elsewhere classified, thoracic region, initial encounter for fracture: Secondary | ICD-10-CM | POA: Diagnosis present

## 2024-04-25 DIAGNOSIS — E785 Hyperlipidemia, unspecified: Secondary | ICD-10-CM | POA: Diagnosis not present

## 2024-04-25 DIAGNOSIS — I110 Hypertensive heart disease with heart failure: Secondary | ICD-10-CM | POA: Diagnosis present

## 2024-04-25 DIAGNOSIS — Z7982 Long term (current) use of aspirin: Secondary | ICD-10-CM

## 2024-04-25 DIAGNOSIS — Z993 Dependence on wheelchair: Secondary | ICD-10-CM | POA: Diagnosis not present

## 2024-04-25 DIAGNOSIS — G629 Polyneuropathy, unspecified: Secondary | ICD-10-CM | POA: Diagnosis present

## 2024-04-25 DIAGNOSIS — S3992XA Unspecified injury of lower back, initial encounter: Secondary | ICD-10-CM | POA: Diagnosis not present

## 2024-04-25 DIAGNOSIS — M810 Age-related osteoporosis without current pathological fracture: Secondary | ICD-10-CM | POA: Diagnosis present

## 2024-04-25 DIAGNOSIS — M25511 Pain in right shoulder: Secondary | ICD-10-CM | POA: Diagnosis not present

## 2024-04-25 DIAGNOSIS — R7989 Other specified abnormal findings of blood chemistry: Secondary | ICD-10-CM | POA: Diagnosis not present

## 2024-04-25 DIAGNOSIS — I25110 Atherosclerotic heart disease of native coronary artery with unstable angina pectoris: Secondary | ICD-10-CM | POA: Diagnosis not present

## 2024-04-25 DIAGNOSIS — S22060A Wedge compression fracture of T7-T8 vertebra, initial encounter for closed fracture: Secondary | ICD-10-CM

## 2024-04-25 DIAGNOSIS — W19XXXA Unspecified fall, initial encounter: Secondary | ICD-10-CM | POA: Diagnosis present

## 2024-04-25 DIAGNOSIS — I6523 Occlusion and stenosis of bilateral carotid arteries: Secondary | ICD-10-CM | POA: Diagnosis not present

## 2024-04-25 DIAGNOSIS — Z951 Presence of aortocoronary bypass graft: Secondary | ICD-10-CM

## 2024-04-25 DIAGNOSIS — I10 Essential (primary) hypertension: Secondary | ICD-10-CM | POA: Diagnosis not present

## 2024-04-25 DIAGNOSIS — R7303 Prediabetes: Secondary | ICD-10-CM | POA: Diagnosis present

## 2024-04-25 DIAGNOSIS — Z818 Family history of other mental and behavioral disorders: Secondary | ICD-10-CM

## 2024-04-25 DIAGNOSIS — R531 Weakness: Secondary | ICD-10-CM | POA: Diagnosis not present

## 2024-04-25 DIAGNOSIS — I69354 Hemiplegia and hemiparesis following cerebral infarction affecting left non-dominant side: Secondary | ICD-10-CM | POA: Diagnosis not present

## 2024-04-25 DIAGNOSIS — M25521 Pain in right elbow: Secondary | ICD-10-CM | POA: Diagnosis not present

## 2024-04-25 DIAGNOSIS — R609 Edema, unspecified: Secondary | ICD-10-CM | POA: Diagnosis not present

## 2024-04-25 DIAGNOSIS — S3991XA Unspecified injury of abdomen, initial encounter: Secondary | ICD-10-CM | POA: Diagnosis not present

## 2024-04-25 DIAGNOSIS — I6381 Other cerebral infarction due to occlusion or stenosis of small artery: Secondary | ICD-10-CM | POA: Diagnosis not present

## 2024-04-25 DIAGNOSIS — Z841 Family history of disorders of kidney and ureter: Secondary | ICD-10-CM

## 2024-04-25 DIAGNOSIS — R42 Dizziness and giddiness: Secondary | ICD-10-CM | POA: Diagnosis not present

## 2024-04-25 DIAGNOSIS — E860 Dehydration: Secondary | ICD-10-CM | POA: Diagnosis present

## 2024-04-25 DIAGNOSIS — E872 Acidosis, unspecified: Secondary | ICD-10-CM | POA: Diagnosis not present

## 2024-04-25 DIAGNOSIS — I2510 Atherosclerotic heart disease of native coronary artery without angina pectoris: Secondary | ICD-10-CM | POA: Diagnosis not present

## 2024-04-25 DIAGNOSIS — J209 Acute bronchitis, unspecified: Secondary | ICD-10-CM | POA: Diagnosis not present

## 2024-04-25 DIAGNOSIS — Z743 Need for continuous supervision: Secondary | ICD-10-CM | POA: Diagnosis not present

## 2024-04-25 DIAGNOSIS — I429 Cardiomyopathy, unspecified: Secondary | ICD-10-CM | POA: Diagnosis not present

## 2024-04-25 DIAGNOSIS — R6520 Severe sepsis without septic shock: Secondary | ICD-10-CM | POA: Diagnosis not present

## 2024-04-25 DIAGNOSIS — Z7983 Long term (current) use of bisphosphonates: Secondary | ICD-10-CM

## 2024-04-25 DIAGNOSIS — Z1152 Encounter for screening for COVID-19: Secondary | ICD-10-CM

## 2024-04-25 DIAGNOSIS — Z7901 Long term (current) use of anticoagulants: Secondary | ICD-10-CM

## 2024-04-25 DIAGNOSIS — J440 Chronic obstructive pulmonary disease with acute lower respiratory infection: Secondary | ICD-10-CM | POA: Diagnosis present

## 2024-04-25 DIAGNOSIS — K5900 Constipation, unspecified: Secondary | ICD-10-CM | POA: Diagnosis not present

## 2024-04-25 DIAGNOSIS — I7121 Aneurysm of the ascending aorta, without rupture: Secondary | ICD-10-CM | POA: Diagnosis present

## 2024-04-25 DIAGNOSIS — S0990XA Unspecified injury of head, initial encounter: Secondary | ICD-10-CM | POA: Diagnosis not present

## 2024-04-25 DIAGNOSIS — Z723 Lack of physical exercise: Secondary | ICD-10-CM

## 2024-04-25 DIAGNOSIS — Z91148 Patient's other noncompliance with medication regimen for other reason: Secondary | ICD-10-CM

## 2024-04-25 DIAGNOSIS — Z833 Family history of diabetes mellitus: Secondary | ICD-10-CM

## 2024-04-25 DIAGNOSIS — G8190 Hemiplegia, unspecified affecting unspecified side: Secondary | ICD-10-CM | POA: Diagnosis not present

## 2024-04-25 DIAGNOSIS — K219 Gastro-esophageal reflux disease without esophagitis: Secondary | ICD-10-CM | POA: Diagnosis not present

## 2024-04-25 DIAGNOSIS — I2583 Coronary atherosclerosis due to lipid rich plaque: Secondary | ICD-10-CM | POA: Diagnosis present

## 2024-04-25 DIAGNOSIS — Z79899 Other long term (current) drug therapy: Secondary | ICD-10-CM

## 2024-04-25 DIAGNOSIS — I21A1 Myocardial infarction type 2: Secondary | ICD-10-CM | POA: Diagnosis present

## 2024-04-25 DIAGNOSIS — R9431 Abnormal electrocardiogram [ECG] [EKG]: Secondary | ICD-10-CM | POA: Diagnosis present

## 2024-04-25 DIAGNOSIS — M25531 Pain in right wrist: Secondary | ICD-10-CM | POA: Diagnosis not present

## 2024-04-25 DIAGNOSIS — Z9181 History of falling: Secondary | ICD-10-CM

## 2024-04-25 DIAGNOSIS — N179 Acute kidney failure, unspecified: Secondary | ICD-10-CM | POA: Diagnosis not present

## 2024-04-25 DIAGNOSIS — R55 Syncope and collapse: Secondary | ICD-10-CM | POA: Diagnosis not present

## 2024-04-25 DIAGNOSIS — Z8249 Family history of ischemic heart disease and other diseases of the circulatory system: Secondary | ICD-10-CM | POA: Diagnosis not present

## 2024-04-25 DIAGNOSIS — K7201 Acute and subacute hepatic failure with coma: Secondary | ICD-10-CM | POA: Diagnosis not present

## 2024-04-25 DIAGNOSIS — I7143 Infrarenal abdominal aortic aneurysm, without rupture: Secondary | ICD-10-CM | POA: Diagnosis present

## 2024-04-25 DIAGNOSIS — S299XXA Unspecified injury of thorax, initial encounter: Secondary | ICD-10-CM | POA: Diagnosis not present

## 2024-04-25 DIAGNOSIS — R2689 Other abnormalities of gait and mobility: Secondary | ICD-10-CM | POA: Diagnosis not present

## 2024-04-25 DIAGNOSIS — R404 Transient alteration of awareness: Secondary | ICD-10-CM | POA: Diagnosis not present

## 2024-04-25 DIAGNOSIS — I480 Paroxysmal atrial fibrillation: Secondary | ICD-10-CM | POA: Diagnosis present

## 2024-04-25 DIAGNOSIS — I252 Old myocardial infarction: Secondary | ICD-10-CM

## 2024-04-25 DIAGNOSIS — I340 Nonrheumatic mitral (valve) insufficiency: Secondary | ICD-10-CM | POA: Diagnosis present

## 2024-04-25 DIAGNOSIS — A419 Sepsis, unspecified organism: Secondary | ICD-10-CM | POA: Diagnosis not present

## 2024-04-25 DIAGNOSIS — Z7401 Bed confinement status: Secondary | ICD-10-CM | POA: Diagnosis not present

## 2024-04-25 DIAGNOSIS — M79601 Pain in right arm: Secondary | ICD-10-CM | POA: Diagnosis present

## 2024-04-25 DIAGNOSIS — S3993XA Unspecified injury of pelvis, initial encounter: Secondary | ICD-10-CM | POA: Diagnosis not present

## 2024-04-25 DIAGNOSIS — M6281 Muscle weakness (generalized): Secondary | ICD-10-CM | POA: Diagnosis not present

## 2024-04-25 DIAGNOSIS — Z604 Social exclusion and rejection: Secondary | ICD-10-CM | POA: Diagnosis present

## 2024-04-25 DIAGNOSIS — I5022 Chronic systolic (congestive) heart failure: Secondary | ICD-10-CM | POA: Diagnosis not present

## 2024-04-25 DIAGNOSIS — J449 Chronic obstructive pulmonary disease, unspecified: Secondary | ICD-10-CM | POA: Diagnosis not present

## 2024-04-25 DIAGNOSIS — I700 Atherosclerosis of aorta: Secondary | ICD-10-CM | POA: Diagnosis not present

## 2024-04-25 DIAGNOSIS — I499 Cardiac arrhythmia, unspecified: Secondary | ICD-10-CM | POA: Diagnosis not present

## 2024-04-25 DIAGNOSIS — M19011 Primary osteoarthritis, right shoulder: Secondary | ICD-10-CM | POA: Diagnosis not present

## 2024-04-25 DIAGNOSIS — Z87891 Personal history of nicotine dependence: Secondary | ICD-10-CM

## 2024-04-25 DIAGNOSIS — I517 Cardiomegaly: Secondary | ICD-10-CM | POA: Diagnosis not present

## 2024-04-25 DIAGNOSIS — Z888 Allergy status to other drugs, medicaments and biological substances status: Secondary | ICD-10-CM

## 2024-04-25 DIAGNOSIS — I421 Obstructive hypertrophic cardiomyopathy: Secondary | ICD-10-CM | POA: Diagnosis not present

## 2024-04-25 DIAGNOSIS — S199XXA Unspecified injury of neck, initial encounter: Secondary | ICD-10-CM | POA: Diagnosis not present

## 2024-04-25 DIAGNOSIS — M7989 Other specified soft tissue disorders: Secondary | ICD-10-CM | POA: Diagnosis not present

## 2024-04-25 DIAGNOSIS — D500 Iron deficiency anemia secondary to blood loss (chronic): Secondary | ICD-10-CM | POA: Diagnosis not present

## 2024-04-25 DIAGNOSIS — M25421 Effusion, right elbow: Secondary | ICD-10-CM | POA: Diagnosis not present

## 2024-04-25 DIAGNOSIS — R0989 Other specified symptoms and signs involving the circulatory and respiratory systems: Secondary | ICD-10-CM | POA: Diagnosis not present

## 2024-04-25 DIAGNOSIS — S22060D Wedge compression fracture of T7-T8 vertebra, subsequent encounter for fracture with routine healing: Secondary | ICD-10-CM | POA: Diagnosis not present

## 2024-04-25 DIAGNOSIS — R54 Age-related physical debility: Secondary | ICD-10-CM | POA: Diagnosis present

## 2024-04-25 LAB — CBC WITH DIFFERENTIAL/PLATELET
Abs Immature Granulocytes: 0.1 K/uL — ABNORMAL HIGH (ref 0.00–0.07)
Basophils Absolute: 0 K/uL (ref 0.0–0.1)
Basophils Relative: 0 %
Eosinophils Absolute: 0 K/uL (ref 0.0–0.5)
Eosinophils Relative: 0 %
HCT: 34 % — ABNORMAL LOW (ref 36.0–46.0)
Hemoglobin: 9.9 g/dL — ABNORMAL LOW (ref 12.0–15.0)
Immature Granulocytes: 1 %
Lymphocytes Relative: 10 %
Lymphs Abs: 1.8 K/uL (ref 0.7–4.0)
MCH: 22.3 pg — ABNORMAL LOW (ref 26.0–34.0)
MCHC: 29.1 g/dL — ABNORMAL LOW (ref 30.0–36.0)
MCV: 76.6 fL — ABNORMAL LOW (ref 80.0–100.0)
Monocytes Absolute: 1 K/uL (ref 0.1–1.0)
Monocytes Relative: 6 %
Neutro Abs: 15.5 K/uL — ABNORMAL HIGH (ref 1.7–7.7)
Neutrophils Relative %: 83 %
Platelets: 181 K/uL (ref 150–400)
RBC: 4.44 MIL/uL (ref 3.87–5.11)
RDW: 16.6 % — ABNORMAL HIGH (ref 11.5–15.5)
WBC: 18.4 K/uL — ABNORMAL HIGH (ref 4.0–10.5)
nRBC: 0 % (ref 0.0–0.2)

## 2024-04-25 LAB — TYPE AND SCREEN
ABO/RH(D): O POS
Antibody Screen: NEGATIVE

## 2024-04-25 LAB — URINALYSIS, ROUTINE W REFLEX MICROSCOPIC
Bacteria, UA: NONE SEEN
Bilirubin Urine: NEGATIVE
Glucose, UA: NEGATIVE mg/dL
Ketones, ur: NEGATIVE mg/dL
Leukocytes,Ua: NEGATIVE
Nitrite: NEGATIVE
Protein, ur: 30 mg/dL — AB
Specific Gravity, Urine: 1.03 (ref 1.005–1.030)
pH: 5 (ref 5.0–8.0)

## 2024-04-25 LAB — HIV ANTIBODY (ROUTINE TESTING W REFLEX): HIV Screen 4th Generation wRfx: NONREACTIVE

## 2024-04-25 LAB — I-STAT CHEM 8, ED
BUN: 28 mg/dL — ABNORMAL HIGH (ref 8–23)
Calcium, Ion: 1.1 mmol/L — ABNORMAL LOW (ref 1.15–1.40)
Chloride: 113 mmol/L — ABNORMAL HIGH (ref 98–111)
Creatinine, Ser: 1.2 mg/dL — ABNORMAL HIGH (ref 0.44–1.00)
Glucose, Bld: 166 mg/dL — ABNORMAL HIGH (ref 70–99)
HCT: 33 % — ABNORMAL LOW (ref 36.0–46.0)
Hemoglobin: 11.2 g/dL — ABNORMAL LOW (ref 12.0–15.0)
Potassium: 3.6 mmol/L (ref 3.5–5.1)
Sodium: 145 mmol/L (ref 135–145)
TCO2: 16 mmol/L — ABNORMAL LOW (ref 22–32)

## 2024-04-25 LAB — LACTIC ACID, PLASMA: Lactic Acid, Venous: 3.3 mmol/L (ref 0.5–1.9)

## 2024-04-25 LAB — COMPREHENSIVE METABOLIC PANEL WITH GFR
ALT: 48 U/L — ABNORMAL HIGH (ref 0–44)
AST: 72 U/L — ABNORMAL HIGH (ref 15–41)
Albumin: 4 g/dL (ref 3.5–5.0)
Alkaline Phosphatase: 69 U/L (ref 38–126)
Anion gap: 18 — ABNORMAL HIGH (ref 5–15)
BUN: 28 mg/dL — ABNORMAL HIGH (ref 8–23)
CO2: 16 mmol/L — ABNORMAL LOW (ref 22–32)
Calcium: 9.4 mg/dL (ref 8.9–10.3)
Chloride: 109 mmol/L (ref 98–111)
Creatinine, Ser: 1.35 mg/dL — ABNORMAL HIGH (ref 0.44–1.00)
GFR, Estimated: 43 mL/min — ABNORMAL LOW (ref >60)
Glucose, Bld: 168 mg/dL — ABNORMAL HIGH (ref 70–99)
Potassium: 3.7 mmol/L (ref 3.5–5.1)
Sodium: 143 mmol/L (ref 135–145)
Total Bilirubin: 1.4 mg/dL — ABNORMAL HIGH (ref 0.0–1.2)
Total Protein: 6.6 g/dL (ref 6.5–8.1)

## 2024-04-25 LAB — CK TOTAL AND CKMB (NOT AT ARMC)
CK, MB: 26.9 ng/mL — ABNORMAL HIGH (ref 0.5–5.0)
Total CK: 915 U/L — ABNORMAL HIGH (ref 38–234)

## 2024-04-25 LAB — LIPASE, BLOOD: Lipase: 21 U/L (ref 11–51)

## 2024-04-25 LAB — PROTIME-INR
INR: 2.6 — ABNORMAL HIGH (ref 0.8–1.2)
Prothrombin Time: 29.2 s — ABNORMAL HIGH (ref 11.4–15.2)

## 2024-04-25 LAB — I-STAT CG4 LACTIC ACID, ED: Lactic Acid, Venous: 5.4 mmol/L (ref 0.5–1.9)

## 2024-04-25 LAB — APTT: aPTT: 36 s (ref 24–36)

## 2024-04-25 LAB — TROPONIN I (HIGH SENSITIVITY)
Troponin I (High Sensitivity): 235 ng/L (ref <18)
Troponin I (High Sensitivity): 526 ng/L (ref <18)

## 2024-04-25 LAB — ETHANOL: Alcohol, Ethyl (B): <15 mg/dL (ref <15)

## 2024-04-25 LAB — PROCALCITONIN: Procalcitonin: 8.91 ng/mL

## 2024-04-25 MED ORDER — ONDANSETRON HCL 4 MG PO TABS: 4.0000 mg | ORAL_TABLET | Freq: Four times a day (QID) | ORAL | Status: DC | PRN

## 2024-04-25 MED ORDER — ONDANSETRON HCL 4 MG/2ML IJ SOLN
4.0000 mg | Freq: Once | INTRAMUSCULAR | Status: AC
  Administered 2024-04-25: 4 mg via INTRAVENOUS
  Filled 2024-04-25: qty 2

## 2024-04-25 MED ORDER — ONDANSETRON HCL 4 MG/2ML IJ SOLN
4.0000 mg | Freq: Four times a day (QID) | INTRAMUSCULAR | Status: DC | PRN
  Filled 2024-04-25: qty 2

## 2024-04-25 MED ORDER — ACETAMINOPHEN 325 MG PO TABS
650.0000 mg | ORAL_TABLET | Freq: Four times a day (QID) | ORAL | Status: DC | PRN
  Administered 2024-04-25 – 2024-05-03 (×6): 650 mg via ORAL
  Filled 2024-04-25 (×6): qty 2

## 2024-04-25 MED ORDER — UMECLIDINIUM-VILANTEROL 62.5-25 MCG/ACT IN AEPB
1.0000 | INHALATION_SPRAY | Freq: Every day | RESPIRATORY_TRACT | Status: DC
  Administered 2024-04-27 – 2024-05-03 (×7): 1 via RESPIRATORY_TRACT
  Filled 2024-04-25: qty 14

## 2024-04-25 MED ORDER — SODIUM CHLORIDE 0.9 % IV BOLUS
500.0000 mL | Freq: Once | INTRAVENOUS | Status: AC
  Administered 2024-04-25: 500 mL via INTRAVENOUS

## 2024-04-25 MED ORDER — SODIUM CHLORIDE 0.9 % IV SOLN
2.0000 g | Freq: Once | INTRAVENOUS | Status: AC
  Administered 2024-04-25: 2 g via INTRAVENOUS
  Filled 2024-04-25: qty 12.5

## 2024-04-25 MED ORDER — LACTATED RINGERS IV BOLUS (SEPSIS)
701.0000 mL | Freq: Once | INTRAVENOUS | Status: AC
  Administered 2024-04-25: 701 mL via INTRAVENOUS

## 2024-04-25 MED ORDER — ACETAMINOPHEN 650 MG RE SUPP: 650.0000 mg | Freq: Four times a day (QID) | RECTAL | Status: DC | PRN

## 2024-04-25 MED ORDER — OXYCODONE HCL 5 MG PO TABS
5.0000 mg | ORAL_TABLET | Freq: Four times a day (QID) | ORAL | Status: DC | PRN
  Administered 2024-04-26 – 2024-05-02 (×9): 5 mg via ORAL
  Filled 2024-04-25 (×10): qty 1

## 2024-04-25 MED ORDER — IOHEXOL 350 MG/ML SOLN
55.0000 mL | Freq: Once | INTRAVENOUS | Status: AC | PRN
  Administered 2024-04-25: 55 mL via INTRAVENOUS

## 2024-04-25 MED ORDER — FENTANYL CITRATE PF 50 MCG/ML IJ SOSY
50.0000 ug | PREFILLED_SYRINGE | Freq: Once | INTRAMUSCULAR | Status: AC
  Administered 2024-04-25: 50 ug via INTRAVENOUS
  Filled 2024-04-25: qty 1

## 2024-04-25 MED ORDER — SODIUM CHLORIDE 0.9 % IV SOLN: INTRAVENOUS | Status: AC

## 2024-04-25 MED ORDER — VANCOMYCIN HCL IN DEXTROSE 1-5 GM/200ML-% IV SOLN
1000.0000 mg | Freq: Once | INTRAVENOUS | Status: AC
  Administered 2024-04-25: 1000 mg via INTRAVENOUS
  Filled 2024-04-25: qty 200

## 2024-04-25 MED ORDER — ALBUTEROL SULFATE (2.5 MG/3ML) 0.083% IN NEBU: 2.5000 mg | INHALATION_SOLUTION | Freq: Four times a day (QID) | RESPIRATORY_TRACT | Status: DC | PRN

## 2024-04-25 MED ORDER — LACTATED RINGERS IV BOLUS
500.0000 mL | Freq: Once | INTRAVENOUS | Status: AC
  Administered 2024-04-26: 500 mL via INTRAVENOUS

## 2024-04-25 NOTE — ED Notes (Signed)
 Trauma rn arrived

## 2024-04-25 NOTE — ED Notes (Signed)
 Ortho tech arrived at bedside

## 2024-04-25 NOTE — Sepsis Progress Note (Addendum)
 Code Sepsis protocol being monitored by e[employer].

## 2024-04-25 NOTE — ED Provider Notes (Signed)
 [place] [facility] DE[facility]MENT AT [facility] Provider Note   CSN: [id number] Arrival date & time: 04/25/24  1604     Patient presents with: Kimberly [name] is a 68 y.o. female.   68 year old female presents for evaluation of a fall.  She was found on the floor by her daughter who tried to call and could not get a hold of her.  Was likely on the floor for about 4 hours.  Patient has abrasions to her chin and cheek and is complaining of allover body pain, back pain head and neck pain.  She is on Eliquis .  She states she got dizzy before the fall but did not pass out.  Denies any other symptoms or concerns at this time.   Fall Associated symptoms include headaches. Pertinent negatives include no chest pain, no abdominal pain and no shortness of breath.       Prior to Admission medications   Medication Sig Start Date End Date Taking? Authorizing Provider  carvedilol  (COREG ) 12.5 MG tablet TAKE 1 TABLET BY MOUTH TWICE DAILY WITH MEALS 03/24/24  Yes Georgina Speaks, FNP  ELIQUIS  5 MG TABS tablet TAKE 1 TABLET BY MOUTH 2 TIMES DAILY 04/17/24  Yes Georgina Speaks, FNP  albuterol  (VENTOLIN  HFA) 108 (90 Base) MCG/ACT inhaler INHALE 2 PUFFS BY MOUTH INTO THE LUNGS EVERY 6 HOURS AS NEEDED FOR WHEEZING OR SHORTNESS OF BREATH 07/31/23   Shelah Lamar RAMAN, MD  alendronate  (FOSAMAX ) 70 MG tablet TAKE 1 TABLET BY MOUTH EVERY 7 DAYS. Take WITH A full GLASS OF WATER ON EMPTY stomach 03/24/24   Georgina Speaks, FNP  amLODipine  (NORVASC ) 5 MG tablet TAKE 1 TABLET BY MOUTH EVERY DAY 08/14/23   Lavona Agent, MD  ANORO ELLIPTA  62.5-25 MCG/ACT AEPB INHALE 1 PUFF BY MOUTH INTO THE LUNGS ONCE DAILY. RINSE MOUTH AFTER USE 12/18/23   Georgina Speaks, FNP  atorvastatin  (LIPITOR ) 80 MG tablet TAKE 1 TABLET BY MOUTH EVERY DAY 04/17/24   Georgina Speaks, FNP  benzonatate  (TESSALON  PERLES) 100 MG capsule Take 1 capsule (100 mg total) by mouth 3 (three) times daily as needed. 04/02/24 04/02/25  Petrina Pries, NP   Ferric Maltol  (ACCRUFER ) 30 MG CAPS Take 1 capsule (30 mg total) by mouth daily. 04/21/24   Petrina Pries, NP  fluticasone  (FLONASE ) 50 MCG/ACT nasal spray SPRAY 2 SPRAYS into EACH nostril DAILY 09/27/22   Georgina Speaks, FNP  furosemide  (LASIX ) 20 MG tablet Take 1 tablet every other day for one week 10/11/23   Moore, [name], FNP  hydrocortisone cream 1 % Apply 1 application topically daily as needed for itching.    [provider]  lidocaine  (LIDODERM ) 5 % APPLY 1 PATCH TO SKIN DAILY 09/07/22   Georgina Speaks, FNP  mirtazapine  (REMERON ) 15 MG tablet TAKE 1 TABLET BY MOUTH AT BEDTIME 03/24/24   Georgina Speaks, FNP  nicotine  (NICODERM CQ  - DOSED IN MG/24 HOURS) 14 mg/24hr patch Place 1 patch (14 mg total) onto the skin daily. 08/15/22   Georgina Speaks, FNP  pantoprazole  (PROTONIX ) 40 MG tablet TAKE 1 TABLET BY MOUTH EVERY DAY 03/24/24   Georgina Speaks, FNP  Vitamin D , Ergocalciferol , (DRISDOL ) 1.25 MG (50000 UNIT) CAPS capsule TAKE 1 CAPSULE BY MOUTH TWICE A WEEK 03/24/24   Georgina Speaks, FNP    Allergies: Clonidine  derivatives and Spironolactone     Review of Systems  Constitutional:  Negative for chills and fever.  HENT:  Negative for ear pain and sore throat.   Eyes:  Negative  for pain and visual disturbance.  Respiratory:  Negative for cough and shortness of breath.   Cardiovascular:  Negative for chest pain and palpitations.  Gastrointestinal:  Negative for abdominal pain and vomiting.  Genitourinary:  Negative for dysuria and hematuria.  Musculoskeletal:  Positive for back pain. Negative for arthralgias.  Skin:  Negative for color change and rash.  Neurological:  Positive for headaches. Negative for seizures and syncope.  All other systems reviewed and are negative.   Updated Vital Signs BP (!) 126/94   Pulse 99   Temp 97.8 F (36.6 C)   Resp 12   Ht 5' 7 (1.702 m)   Wt 56.7 kg   SpO2 100%   BMI 19.58 kg/m   Physical Exam Vitals and nursing note reviewed.  Constitutional:       General: She is not in acute distress.    Appearance: Normal appearance. She is well-developed. She is not ill-appearing.  HENT:     Head: Normocephalic.     Comments: Abrasion to cheek and chin  Eyes:     Conjunctiva/sclera: Conjunctivae normal.  Neck:     Comments: Midline c spine tenderness  Cardiovascular:     Rate and Rhythm: Normal rate and regular rhythm.     Pulses: Normal pulses.     Heart sounds: Normal heart sounds. No murmur heard. Pulmonary:     Effort: Pulmonary effort is normal. No respiratory distress.     Breath sounds: Normal breath sounds.  Abdominal:     Palpations: Abdomen is soft.     Tenderness: There is abdominal tenderness.  Musculoskeletal:        General: No swelling.     Cervical back: Neck supple.  Skin:    General: Skin is warm and dry.     Capillary Refill: Capillary refill takes less than 2 seconds.  Neurological:     Mental Status: She is alert.  Psychiatric:        Mood and Affect: Mood normal.     (all labs ordered are listed, but only abnormal results are displayed) Labs Reviewed  COMPREHENSIVE METABOLIC PANEL WITH GFR - Abnormal; Notable for the following components:      Result Value   CO2 16 (*)    Glucose, Bld 168 (*)    BUN 28 (*)    Creatinine, Ser 1.35 (*)    AST 72 (*)    ALT 48 (*)    Total Bilirubin 1.4 (*)    GFR, Estimated 43 (*)    Anion gap 18 (*)    All other components within normal limits  URINALYSIS, ROUTINE W REFLEX MICROSCOPIC - Abnormal; Notable for the following components:   Hgb urine dipstick MODERATE (*)    Protein, ur 30 (*)    All other components within normal limits  PROTIME-INR - Abnormal; Notable for the following components:   Prothrombin Time 29.2 (*)    INR 2.6 (*)    All other components within normal limits  CK TOTAL AND CKMB (NOT AT [facility]) - Abnormal; Notable for the following components:   Total CK 915 (*)    CK, MB 26.9 (*)    All other components within normal limits  CBC WITH  DIFFERENTIAL/PLATELET - Abnormal; Notable for the following components:   WBC 18.4 (*)    Hemoglobin 9.9 (*)    HCT 34.0 (*)    MCV 76.6 (*)    MCH 22.3 (*)    MCHC 29.1 (*)    RDW  16.6 (*)    Neutro Abs 15.5 (*)    Abs Immature Granulocytes 0.10 (*)    All other components within normal limits  I-STAT CHEM 8, ED - Abnormal; Notable for the following components:   Chloride 113 (*)    BUN 28 (*)    Creatinine, Ser 1.20 (*)    Glucose, Bld 166 (*)    Calcium , Ion 1.10 (*)    TCO2 16 (*)    Hemoglobin 11.2 (*)    HCT 33.0 (*)    All other components within normal limits  I-STAT CG4 LACTIC ACID, ED - Abnormal; Notable for the following components:   Lactic Acid, Venous 5.4 (*)    All other components within normal limits  TROPONIN I (HIGH SENSITIVITY) - Abnormal; Notable for the following components:   Troponin I (High Sensitivity) 235 (*)    All other components within normal limits  TROPONIN I (HIGH SENSITIVITY) - Abnormal; Notable for the following components:   Troponin I (High Sensitivity) 526 (*)    All other components within normal limits  CULTURE, BLOOD (SINGLE)  RESP PANEL BY RT-PCR (RSV, FLU A&B, COVID)  RVPGX2  ETHANOL  APTT  LIPASE, BLOOD  PROCALCITONIN  CBC  HIV ANTIBODY (ROUTINE TESTING W REFLEX)  CBC  COMPREHENSIVE METABOLIC PANEL WITH GFR  I-STAT CHEM 8, ED  I-STAT CG4 LACTIC ACID, ED  TYPE AND SCREEN    EKG: EKG Interpretation Date/Time:  Friday April 25 2024 17:13:13 EDT Ventricular Rate:  101 PR Interval:    QRS Duration:  105 QT Interval:  397 QTC Calculation: 515 R Axis:   53  Text Interpretation: Atrial fibrillation Repol abnrm suggests ischemia, anterolateral Prolonged QT interval Compared with prior EKG from 04/06/2023 Confirmed by Gennaro Bouchard ([id number]) on 04/25/2024 7:48:46 PM  Radiology: CT CHEST ABDOMEN PELVIS W CONTRAST Result Date: 04/25/2024 CLINICAL DATA:  Blunt trauma EXAM: CT CHEST, ABDOMEN, AND PELVIS WITH CONTRAST  TECHNIQUE: Multidetector CT imaging of the chest, abdomen and pelvis was performed following the standard protocol during bolus administration of intravenous contrast. RADIATION DOSE REDUCTION: This exam was performed according to the departmental dose-optimization program which includes automated exposure control, adjustment of the mA and/or kV according to patient size and/or use of iterative reconstruction technique. CONTRAST:  55mL OMNIPAQUE  IOHEXOL  350 MG/ML SOLN COMPARISON:  Chest radiograph same day, chest CT Dec 26, 2021 FINDINGS: CT CHEST FINDINGS Cardiovascular: The heart size is enlarged. Aneurysmal dilation of ascending aorta measuring 4.3 cm. Enlarged pulmonary artery measuring 3.6 cm. Status post CABG. No pericardial effusion. Atherosclerotic calcifications of coronary arteries. Mediastinum/Nodes: No suspicious lymphadenopathy. Few subcentimeter bilateral lower paratracheal lymph nodes likely reactive. Lungs/Pleura: Upper lobe predominant centrilobular emphysematous changes. Multi foci scarring/subsegmental atelectasis predominantly involving the right middle lobe and posteromedial left lower lobe likely sequela from chronic infection/inflammation, increased to prior. Progressive fibrotic changes and interlobular septal thickening in peripheral right upper and lower lobes. No posttraumatic changes of the lungs. There is posterior indentation/septation versus a diverticulum along the left lateral wall of the trachea in thoracic inlet. No pleural effusion. No pneumothorax. Musculoskeletal: No suspicious fracture or lytic sclerotic osseous lesion. Median sternotomy. CT ABDOMEN PELVIS FINDINGS Hepatobiliary: No suspicious liver lesion. No intrahepatic biliary dilatation. Gallbladder is unremarkable. Common bile duct is dilated up to 7 mm. No definite choledocholithiasis identified. Pancreas: Pancreas is normal.  No pancreatic ductal dilatation. Spleen: Normal size spleen. Adrenals/Urinary Tract: Both  adrenal glands are normal. Left kidney subcentimeter cortical cysts which does not require imaging follow-up  no hydronephrosis.Bladder is unremarkable. Stomach/Bowel: Stomach and bowel loops appear normal. No bowel dilatation, obstruction or wall thickening. Large stool is identified throughout the colon. Normal appendix. Vascular/Lymphatic: Atherosclerotic calcifications of aorta. Aneurysmal dilation of infrarenal aorta measuring 3.2 x 3 x 2.8 cm which does not require imaging follow-up. No suspicious lymphadenopathy. Reproductive: Calcified uterine fibroids.  No adnexal mass. Other: Tiny fat containing umbilical hernia a small fat containing ventral wall hernia with a defect measuring 4 mm about 8 cm proximal to umbilicus. Musculoskeletal: Degenerative changes of the spine with compression fracture of T8 vertebral body (40% height reduction), new to 2023. IMPRESSION: Compression fracture of T8 vertebral body of indeterminate age. Please refer to same day dedicated CT. Aneurysmal dilation of ascending aorta and infrarenal abdominal aorta. Enlarged pulmonary artery which can be seen the setting of pulmonary artery hypertension. Stigmata of interstitial and chronic lung changes likely sequela from prior infection/inflammation, increased to prior exam from 2023. No suspicious pulmonary nodule. Additional findings as above. Electronically Signed   By: Jourden Delmont  Zare M.D.   On: 04/25/2024 17:32   CT CERVICAL SPINE WO CONTRAST Result Date: 04/25/2024 EXAM: CT CERVICAL SPINE WITHOUT CONTRAST 04/25/2024 04:57:00 PM TECHNIQUE: CT of the cervical spine was performed without the administration of intravenous contrast. Multiplanar reformatted images are provided for review. Automated exposure control, iterative reconstruction, and/or weight based adjustment of the mA/kV was utilized to reduce the radiation dose to as low as reasonably achievable. COMPARISON: None available. CLINICAL HISTORY: Polytrauma, blunt. Fall; CT CHEST  ABDOMEN PELVIS W CONTRAST; Polytrauma, blunt; CT HEAD WO CONTRAST; Head trauma, moderate-severe; CT CERVICAL SPINE WO CONTRAST; Polytrauma, blunt; CT L-SPINE NO CHARGE; CT T-SPINE NO CHARGE. Omni 350 55mL; See ED Notes:; Pt coming in after a fall pt takes elequis. Pt went to stand from wheelchair. Pt got light headed and dizzy and fell forward no syncope. ; Pt was down about 4 hours. Pt has abrasion to her chin. Pt has hx of stroke minor left sided weakness. Pt reports being sick FINDINGS: CERVICAL SPINE: BONES AND ALIGNMENT: No acute fracture or traumatic malalignment. Straightening of the normal cervical lordosis is stable. The patient is in a hard collar. DEGENERATIVE CHANGES: Mild endplate degenerative changes are again noted at C4-5, C5-6 and C6-7. SOFT TISSUES: No prevertebral soft tissue swelling. IMPRESSION: 1. No acute abnormality of the cervical spine related to the reported polytrauma and fall. 2. Mild endplate degenerative changes at C4-5, C5-6, and C6-7. Electronically signed by: Lonni Necessary MD 04/25/2024 05:28 PM EDT RP Workstation: [id number]   CT HEAD WO CONTRAST Result Date: 04/25/2024 EXAM: CT HEAD WITHOUT CONTRAST 04/25/2024 04:57:00 PM TECHNIQUE: CT of the head was performed without the administration of intravenous contrast. Automated exposure control, iterative reconstruction, and/or weight based adjustment of the mA/kV was utilized to reduce the radiation dose to as low as reasonably achievable. COMPARISON: None available. CLINICAL HISTORY: Head trauma, moderate-severe. Fall; CT CHEST ABDOMEN PELVIS W CONTRAST; Polytrauma, blunt; CT HEAD WO CONTRAST; Head trauma, moderate-severe; CT CERVICAL SPINE WO CONTRAST; Polytrauma, blunt; CT L-SPINE NO CHARGE; CT T-SPINE NO CHARGE. Omni 350 55mL; See ED Notes:; Pt coming in after a fall pt takes elequis. Pt went to stand from wheelchair. Pt got light headed and dizzy and fell forward no syncope. ; Pt was down about 4 hours. Pt has abrasion to  her chin. Pt has hx of stroke minor left sided weakness. Pt reports being sick FINDINGS: BRAIN AND VENTRICLES: Multiple remote cortical infarcts are again noted. Infarcts involve the  posterior medial parietal lobes bilaterally, the inferior right temporal lobe and right occipital lobe in the anterior left middle frontal gyrus. Remote lacunar infarcts are present in the right caudate head, left lentiform nucleus and right thalamus. No acute hemorrhage. No evidence of acute infarct. No hydrocephalus. No extra-axial collection. No mass effect or midline shift. ORBITS: No acute abnormality. Soft tissue swelling extends to the periorbital regions bilaterally. SINUSES: Minimal fluid is present in the maxillary sinuses bilaterally without associated fracture. SOFT TISSUES AND SKULL: Frontal scalp soft tissue swelling is present without underlying fracture or foreign body. No skull fracture. IMPRESSION: 1. No acute intracranial abnormality related to the head trauma. 2. Frontal scalp and bilateral periorbital soft tissue swelling without underlying fracture or foreign body. 3. Multiple remote cortical and lacunar infarcts as described above. Electronically signed by: Lonni Necessary MD 04/25/2024 05:26 PM EDT RP Workstation: [id number]   CT L-SPINE NO CHARGE Result Date: 04/25/2024 EXAM: CT OF THE LUMBAR SPINE WITHOUT CONTRAST 04/25/2024 04:57:00 PM TECHNIQUE: CT of the lumbar spine was performed without the administration of intravenous contrast. Multiplanar reformatted images are provided for review. Automated exposure control, iterative reconstruction, and/or weight based adjustment of the mA/kV was utilized to reduce the radiation dose to as low as reasonably achievable. COMPARISON: 02/13/2018. Images were not currently available. CLINICAL HISTORY: Fall; Polytrauma, blunt; Head trauma, moderate-severe; Pt coming in after a fall pt takes elequis. Pt went to stand from wheelchair. Pt got light headed and dizzy and  fell forward no syncope. Pt was down about 4 hours. Pt has abrasion to her chin. Pt has hx of stroke minor left sided weakness. Pt reports being sick. FINDINGS: BONES AND ALIGNMENT: Normal vertebral body heights. No acute fracture or suspicious bone lesion. Normal alignment. DEGENERATIVE CHANGES: No significant degenerative changes. SOFT TISSUES: No acute abnormality. VASCULATURE: Atherosclerotic calcifications present in the abdominal aorta. Maximal transverse diameter is 3.0 cm. 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 IMPRESSION: 1. No evidence of acute traumatic injury. 2. Atherosclerotic calcifications in the abdominal aorta with infrarenal abdominal aortic aneurysm. The maximal transverse diameter of 3.0 cm. Recommend CT angio of the abdomen and pelvis in 3 years. 3. 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 Electronically signed by: Lonni Necessary MD 04/25/2024 05:16 PM EDT RP Workstation: [id number]   CT T-SPINE NO CHARGE Result Date: 04/25/2024 EXAM: CT THORACIC SPINE WITHOUT CONTRAST 04/25/2024 04:57:00 PM TECHNIQUE: CT of the thoracic spine was performed without the administration of intravenous contrast. Multiplanar reformatted images are provided for review. Automated exposure control, iterative reconstruction, and/or weight based adjustment of the mA/kV was utilized to reduce the radiation dose to as low as reasonably achievable. COMPARISON: CT chest 12/26/2021. CLINICAL HISTORY: Patient with history of stroke and minor left-sided weakness, presenting after a fall from a wheelchair with subsequent head trauma and chin abrasion. The patient was down for approximately 4 hours before seeking medical attention. No syncope reported. FINDINGS: BONES AND ALIGNMENT: Acute superior endplate compression fracture of T8 with 5 mm loss of height compared to prior study, approximately 30%. No retropulsed bone is present. No other acute fractures are present. Normal  alignment. DEGENERATIVE CHANGES: No significant degenerative changes. SOFT TISSUES: No acute abnormality. VASCULATURE: Atherosclerotic changes are present throughout the thoracic descending thoracic aorta and visualized abdominal aorta without focal aneurysm. Please see CT chest for further description of the ascending aorta. Coronary artery calcifications are present. IMPRESSION: 1. Acute superior endplate compression fracture of T8 with 5 mm loss of  height compared to prior study, approximately 30%. No retropulsed bone is present. No other acute fractures are present. Electronically signed by: Lonni Necessary MD 04/25/2024 05:12 PM EDT RP Workstation: [id number]   DG Pelvis Portable Result Date: 04/25/2024 CLINICAL DATA:  Trauma fall EXAM: PORTABLE PELVIS 1-2 VIEWS COMPARISON:  06/23/2022 FINDINGS: Extensive vascular calcification. SI joints are non widened. Pubic symphysis and rami appear intact. Slightly limited assessment of right femoral neck due to positioning. No definitive fracture or malalignment IMPRESSION: No definite acute osseous abnormality. Electronically Signed   By: Luke Bun M.D.   On: 04/25/2024 16:29   DG Chest Port 1 View Result Date: 04/25/2024 CLINICAL DATA:  Trauma fall EXAM: PORTABLE CHEST 1 VIEW COMPARISON:  06/23/2022, chest CT 12/26/2021 FINDINGS: Post sternotomy changes. Cardiomegaly with vascular congestion. Enlarged mediastinal silhouette compared to most recent prior radiograph but probably similar compared with scout CT image from May 2023. Aortic atherosclerosis. No pneumothorax. IMPRESSION: Cardiomegaly with vascular congestion. Enlarged mediastinal silhouette compared to most recent prior radiograph, probably partially attributed to portable technique, however consider chest CT correlation to exclude acute mediastinal pathology as patient has history of known ascending aortic aneurysm on the prior CT Electronically Signed   By: Luke Bun M.D.   On: 04/25/2024  16:28     Procedures   Medications Ordered in the ED  umeclidinium-vilanterol (ANORO ELLIPTA ) 62.5-25 MCG/ACT 1 puff (has no administration in time range)  albuterol  (PROVENTIL ) (2.5 MG/3ML) 0.083% nebulizer solution 2.5 mg (has no administration in time range)  0.9 %  sodium chloride  infusion ( Intravenous New Bag/Given 04/25/24 2110)  acetaminophen  (TYLENOL ) tablet 650 mg (650 mg Oral Given 04/25/24 2108)    Or  acetaminophen  (TYLENOL ) suppository 650 mg ( Rectal See Alternative 04/25/24 2108)  ondansetron  (ZOFRAN ) tablet 4 mg (has no administration in time range)    Or  ondansetron  (ZOFRAN ) injection 4 mg (has no administration in time range)  sodium chloride  0.9 % bolus 500 mL (0 mLs Intravenous Stopped 04/25/24 1900)  ondansetron  (ZOFRAN ) injection 4 mg (4 mg Intravenous Given 04/25/24 1636)  fentaNYL  (SUBLIMAZE ) injection 50 mcg (50 mcg Intravenous Given 04/25/24 1634)  iohexol  (OMNIPAQUE ) 350 MG/ML injection 55 mL (55 mLs Intravenous Contrast Given 04/25/24 1655)  lactated ringers  bolus 701 mL (0 mLs Intravenous Stopped 04/25/24 1945)  ceFEPIme  (MAXIPIME ) 2 g in sodium chloride  0.9 % 100 mL IVPB (0 g Intravenous Stopped 04/25/24 1945)  vancomycin  (VANCOCIN ) IVPB 1000 mg/200 mL premix (0 mg Intravenous Stopped 04/25/24 2107)                                    Medical Decision Making Social service of health: Patient lives alone, extremes of age  Cardiac monitor interpretation: Sinus rhythm to sinus tachycardia, no ectopy  Patient arrived as a level 2 trauma alert after a fall.  Trauma team at bedside on patient's arrival.  She is down for probably about 4 hours.  Complaining of pain all over appears very uncomfortable.  Given IV fluids and pain medication.  Trauma scans negative except for a T12 compression fracture.  Patient has a leukocytosis and elevated lactic acid as well as an anion gap and elevated CPK.  Troponin is elevated as well.  Initial 201 up to 500.  Patient with no EKG  changes and no chest pain.  This is likely demand at this time we will hold off on heparin  for now as well  as she had a fall today and is on Eliquis  already.  Patient was treated for possible sepsis prior to workup coming back as she met criteria.  Discussed patient's case with Dr. Debby, hospitalist the patient will be admitted for further workup and management.  Patient and family are at bedside agreeable with the plan.  I discussed all results and plan with the daughter.  Problems Addressed: Compression fracture of T8 vertebra, initial encounter [facility]): acute illness or injury Elevated troponin: undiagnosed new problem with uncertain prognosis Fall, initial encounter: acute illness or injury Lactic acidosis: undiagnosed new problem with uncertain prognosis Near syncope: undiagnosed new problem with uncertain prognosis Non-traumatic rhabdomyolysis: undiagnosed new problem with uncertain prognosis  Amount and/or Complexity of Data Reviewed Independent Historian: EMS    Details: EMS at bedside help to provide history regarding patient's fall and symptoms External Data Reviewed: notes.    Details: Outpatient records reviewed patient following up with hypertension and kidney disease Labs: ordered. Decision-making details documented in ED Course.    Details: Ordered and reviewed by me and patient with elevated lactic acid, anion gap, leukocytosis, no signs of infection however Radiology: ordered and independent interpretation performed. Decision-making details documented in ED Course.    Details: Ordered and reviewed by me CT chest abdomen and pelvis shows no evidence of intra-abdominal injury or chest injury but patient does have a T8 compression fracture  Ordered and interpreted by me and dependently of radiology: Chest x-ray: Shows no acute abnormality Pelvis x-ray: Shows no acute abnormality CT head: Shows no acute abnormality Discussion of management or test interpretation with external  provider(s): Dr. Debby - hospitalist -I spoke with her regarding the patient's case the patient will be admitted to their service for further workup and management  Risk OTC drugs. Prescription drug management. Parenteral controlled substances. Drug therapy requiring intensive monitoring for toxicity. Decision regarding hospitalization. Diagnosis or treatment significantly limited by social determinants of health. Risk Details: CRITICAL CARE Performed by: Duwaine LITTIE Fusi   Total critical care time: 55 minutes  Critical care time was exclusive of separately billable procedures and treating other patients.  Critical care was necessary to treat or prevent imminent or life-threatening deterioration.  Critical care was time spent personally by me on the following activities: development of treatment plan with patient and/or surrogate as well as nursing, discussions with consultants, evaluation of patient's response to treatment, examination of patient, obtaining history from patient or surrogate, ordering and performing treatments and interventions, ordering and review of laboratory studies, ordering and review of radiographic studies, pulse oximetry and re-evaluation of patient's condition.   Critical Care Total time providing critical care: 55 minutes   =  Final diagnoses:  Lactic acidosis  Non-traumatic rhabdomyolysis  Elevated troponin  Compression fracture of T8 vertebra, initial encounter [facility])  Near syncope  Fall, initial encounter    ED Discharge Orders     None          Fusi Duwaine LITTIE, DO 04/25/24 2229

## 2024-04-25 NOTE — Progress Notes (Signed)
 Orthopedic Tech Progress Note Patient Details:  Kimberly [name] 1956-01-24 [id number] Level 2 Trauma. Not needed Patient ID: Kimberly [name], female   DOB: [birth year], 68 y.o.   MRN: [id number]  Kimberly [name] 04/25/2024, 4:46 PM

## 2024-04-25 NOTE — ED Notes (Signed)
 Unable to obtain cultures before antibiotics started due to pt being a difficult stick.

## 2024-04-25 NOTE — ED Notes (Signed)
 Trauma Response Nurse Documentation  Kimberly [name] is a 68 y.o. female arriving to [facility] ED via EMS  On Eliquis  (apixaban ) daily. Trauma was activated as a Level 2 based on the following trauma criteria Elderly patients > 65 with head trauma on anti-coagulation (excluding ASA).  Patient cleared for CT by Dr. Gennaro. Pt transported to CT with trauma response nurse present to monitor. RN remained with the patient throughout their absence from the department for clinical observation.   GCS 15.  History   Past Medical History:  Diagnosis Date   Bicipital tenosynovitis    Cerebral artery occlusion with cerebral infarction (HCC) 12/05/2007   Qualifier: Diagnosis of   By: Loretha MD, [place]       Coronary artery disease 05/2007   [id number]    Fall 02/13/2018   Family history of ischemic heart disease    Gout    Hammer toe    Hip, thigh, leg, and ankle, insect bite, nonvenomous, without mention of infection(916.4)    Hyperlipidemia    Hypertension    Osteoporosis 02/07/2022   Other abnormality of red blood cells    Rotator cuff syndrome of left shoulder    Stroke [facility])      Past Surgical History:  Procedure Laterality Date   CARDIAC CATHETERIZATION     COLONOSCOPY WITH PROPOFOL  N/A 01/30/2020   Procedure: COLONOSCOPY WITH PROPOFOL ;  Surgeon: Rollin Dover, MD;  Location: [facility] ENDOSCOPY;  Service: Endoscopy;  Laterality: N/A;   CORONARY ARTERY BYPASS GRAFT     triple   CORONARY ARTERY BYPASS GRAFT  2008   POLYPECTOMY  01/30/2020   Procedure: POLYPECTOMY;  Surgeon: Rollin Dover, MD;  Location: [facility] ENDOSCOPY;  Service: Endoscopy;;   TUBAL LIGATION       Initial Focused Assessment (If applicable, or please see trauma documentation): Patient A&Ox4, GCS 15, PERR 3 Airway intact, bilateral breath sounds Pulses 2+ Abrasion to R cheek and chin C-collar not placed by EMS due to no complaints of neck pain, patient complaining of neck/back pain - everything hurts - on  arrival  CT's Completed:   CT Head, CT C-Spine, CT Chest w/ contrast, and CT abdomen/pelvis w/ contrast   Interventions:  IV, labs CXR/PXR 50mcg Fentanyl  4mg  Zofran  CT Head/Cspine/C/A/P 500cc NS bolus  Plan for disposition:  Admission to floor   Event Summary: Patient to ED after a fall at home - according to daughter patient was down approximately 4 hours. Patient states she felt lightheaded, does not remember what caused the fall, hurts all over. Takes Eliquis  for Afib, and has been taking antibiotics for a respiratory illness. Imaging was ordered and revealed T8 fx. Plan for medical admission for infectious workup d/t leukocytosis and elevated lactic. Sepsis protocol initiated in ED.  Bedside handoff with ED RN Alan.    Alan CROME Kerry Odonohue  Trauma Response RN  Please call TRN at [phone] for further assistance.

## 2024-04-25 NOTE — ED Notes (Signed)
 [facility] called

## 2024-04-25 NOTE — ED Triage Notes (Signed)
 Pt coming in after a fall pt takes elequis. Pt went to stand from wheelchair. Pt got light headed and dizzy and fell forward no syncope.  Pt was down about 4 hours. Pt has abrasion to her chin. Pt has hx of stroke minor left sided weakness. Pt reports being sick    Hr 100 to 130 afib 18 rac Bp 135/96 91% on ra put on 2l 97% Rr 14

## 2024-04-25 NOTE — ED Notes (Signed)
 Decreased 5L [place] O2 to 3L [place] O2.

## 2024-04-25 NOTE — H&P (Incomplete)
 History and Physical    Kimberly [name] FMW:[id number] DOB: 01-19-1956 DOA: 04/25/2024  PCP: Georgina Speaks, FNP  Patient coming from: home  I have personally briefly reviewed patient's old medical records in [facility]  Chief Complaint: near syncope with fall  HPI: Kimberly [name] is a 68 y.o. 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 Patient notes she did not have LOC. She however noted she was to weak to get up of the floor. She states she was on floor for around 4 hours. She notes s/p fall generalized pain but noted more pain in mid back after fall.    ED Course:  Vitals: afebrile , BP 127/77, hr 112, sat 94%  Labs  Wbc 18.4 (5.4), hgb 9.9 repeat 11.2, left shift  Inr 2.6  ETOH< 15 Na 143, K 3.7, Cl 109, bicarb 16, glu 168, cr 1.35 ( 1.06)  AST 72, Alt 48  CK 915, CKMB26.9 CE 235,526 Lipase 21 Lactic:5.4 Cxr IMPRESSION: Cardiomegaly with vascular congestion. Enlarged mediastinal silhouette compared to most recent prior radiograph, probably partially attributed to portable technique, however consider chest CT correlation to exclude acute mediastinal pathology as patient has history of known ascending aortic aneurysm on the prior CT   Ctspine IMPRESSION: 1. Acute superior endplate compression fracture of T8 with 5 mm loss of height compared to prior study, approximately 30%. No retropulsed bone is present. No other acute fractures are present.  Ctchest/abdomen IMPRESSION: Compression fracture of T8 vertebral body of indeterminate age. Please refer to same day dedicated CT.   Aneurysmal dilation of  ascending aorta and infrarenal abdominal aorta.   Enlarged pulmonary artery which can be seen the setting of pulmonary artery hypertension.   Stigmata of interstitial and chronic lung changes likely sequela from prior infection/inflammation, increased to prior exam from 2023. No suspicious pulmonary nodule.   UA -negative   Tx fentanyl  , zofran  , NS 500ml  , cefepime , vanc, LR     Review of Systems: As per HPI otherwise 10 point review of systems negative.   Past Medical History:  Diagnosis Date   Bicipital tenosynovitis    Cerebral artery occlusion with cerebral infarction (HCC) 12/05/2007   Qualifier: Diagnosis of   By: Loretha MD, [place]       Coronary artery disease 05/2007   [id number]    Fall 02/13/2018   Family history of ischemic heart disease    Gout    Hammer toe    Hip, thigh, leg, and ankle, insect bite, nonvenomous, without mention of infection(916.4)    Hyperlipidemia    Hypertension    Osteoporosis 02/07/2022   Other abnormality of red blood cells    Rotator cuff syndrome of left shoulder    Stroke [facility])     Past Surgical History:  Procedure Laterality Date   CARDIAC CATHETERIZATION     COLONOSCOPY WITH PROPOFOL  N/A 01/30/2020   Procedure: COLONOSCOPY WITH PROPOFOL ;  Surgeon: Rollin Dover, MD;  Location: [facility];  Service: Endoscopy;  Laterality: N/A;   CORONARY ARTERY BYPASS GRAFT     triple   CORONARY ARTERY BYPASS GRAFT  2008   POLYPECTOMY  01/30/2020   Procedure: POLYPECTOMY;  Surgeon: Rollin Dover, MD;  Location:  [facility];  Service: Endoscopy;;   TUBAL LIGATION       reports that she quit smoking about 4 years ago. Her smoking use included cigarettes. She started smoking about 39 years ago. She has a 70 pack-year smoking history. She quit smokeless tobacco use about 15 years ago. She reports that she does not currently use alcohol. She reports that she does not currently use drugs after having used the following drugs:  Marijuana.  Allergies  Allergen Reactions   Clonidine  Derivatives Other (See Comments)    Patch causes scars   Spironolactone  Rash    Family History  Problem Relation Age of Onset   Hypertension Mother    Heart attack Father    Diabetes Father    Kidney failure Father    Anxiety disorder Sister    Sarcoidosis Sister    *** Prior to Admission medications   Medication Sig Start Date End Date Taking? Authorizing Provider  albuterol  (VENTOLIN  HFA) 108 (90 Base) MCG/ACT inhaler INHALE 2 PUFFS BY MOUTH INTO THE LUNGS EVERY 6 HOURS AS NEEDED FOR WHEEZING OR SHORTNESS OF BREATH 07/31/23   Shelah Lamar RAMAN, MD  alendronate  (FOSAMAX ) 70 MG tablet TAKE 1 TABLET BY MOUTH EVERY 7 DAYS. Take WITH A full GLASS OF WATER ON EMPTY stomach 03/24/24   Georgina Speaks, FNP  amLODipine  (NORVASC ) 5 MG tablet TAKE 1 TABLET BY MOUTH EVERY DAY 08/14/23   Lavona Agent, MD  ANORO ELLIPTA  62.5-25 MCG/ACT AEPB INHALE 1 PUFF BY MOUTH INTO THE LUNGS ONCE DAILY. RINSE MOUTH AFTER USE 12/18/23   Georgina Speaks, FNP  atorvastatin  (LIPITOR ) 80 MG tablet TAKE 1 TABLET BY MOUTH EVERY DAY 04/17/24   Georgina Speaks, FNP  benzonatate  (TESSALON  PERLES) 100 MG capsule Take 1 capsule (100 mg total) by mouth 3 (three) times daily as needed. 04/02/24 04/02/25  Petrina Pries, NP  carvedilol  (COREG ) 12.5 MG tablet TAKE 1 TABLET BY MOUTH TWICE DAILY WITH MEALS 03/24/24   Georgina Speaks, FNP  ELIQUIS  5 MG TABS tablet TAKE 1 TABLET BY MOUTH 2 TIMES DAILY 04/17/24   Georgina Speaks, FNP  Ferric Maltol  (ACCRUFER ) 30 MG CAPS Take 1 capsule (30 mg total) by mouth daily. 04/21/24   Petrina Pries, NP  fluticasone  (FLONASE ) 50 MCG/ACT nasal spray SPRAY 2 SPRAYS into EACH nostril DAILY 09/27/22   Georgina Speaks, FNP  furosemide  (LASIX ) 20 MG tablet Take 1 tablet every other day for one week 10/11/23   Moore, [name], FNP  hydrocortisone cream 1 % Apply 1 application topically daily as needed for itching.    [provider]  lidocaine  (LIDODERM ) 5 % APPLY  1 PATCH TO SKIN DAILY 09/07/22   Georgina Speaks, FNP  mirtazapine  (REMERON ) 15 MG tablet TAKE 1 TABLET BY MOUTH AT BEDTIME 03/24/24   Georgina Speaks, FNP  nicotine  (NICODERM CQ  - DOSED IN MG/24 HOURS) 14 mg/24hr patch Place 1 patch (14 mg total) onto the skin daily. 08/15/22   Georgina Speaks, FNP  pantoprazole  (PROTONIX ) 40 MG tablet TAKE 1 TABLET BY MOUTH EVERY DAY 03/24/24   Georgina Speaks, FNP  Vitamin D , Ergocalciferol , (DRISDOL ) 1.25 MG (50000 UNIT) CAPS capsule TAKE 1 CAPSULE BY MOUTH TWICE A WEEK 03/24/24   Georgina Speaks, FNP    Physical Exam: Vitals:   04/25/24 1627 04/25/24 1700 04/25/24 1845 04/25/24 1930  BP:  (!) 126/108 (!) 145/106 (!) 126/94  Pulse:  (!) 106 99   Resp:  14 20 12   Temp: 97.8 F (36.6 C)     SpO2:  97% 100%   Weight:      Height:        Constitutional: NAD, calm, comfortable Vitals:   04/25/24 1627 04/25/24 1700 04/25/24 1845 04/25/24 1930  BP:  (!) 126/108 (!) 145/106 (!) 126/94  Pulse:  (!) 106 99   Resp:  14 20 12   Temp: 97.8 F (36.6 C)     SpO2:  97% 100%   Weight:      Height:       Eyes: PERRL, lids and conjunctivae normal ENMT: Mucous membranes are moist. Posterior pharynx clear of any exudate or lesions.Normal dentition.  Neck: normal, supple, no masses, no thyromegaly Respiratory: clear to auscultation bilaterally, no wheezing, no crackles. Normal respiratory effort. No accessory muscle use.  Cardiovascular: Regular rate and rhythm, no murmurs / rubs / gallops. No extremity edema. 2+ pedal pulses. No carotid bruits.  Abdomen: no tenderness, no masses palpated. No hepatosplenomegaly. Bowel sounds positive.  Musculoskeletal: no clubbing / cyanosis. No joint deformity upper and lower extremities. Good ROM, no contractures. Normal muscle tone.  Skin: no rashes, lesions, ulcers. No induration Neurologic: CN 2-12 grossly intact. Sensation intact, DTR normal. Strength 5/5 in all 4.  Psychiatric: Normal judgment and insight. Alert and oriented x 3. Normal  mood.    Labs on Admission: I have personally reviewed following labs and imaging studies  CBC: Recent Labs  Lab 04/25/24 1613 04/25/24 1623  WBC 18.4*  --   NEUTROABS 15.5*  --   HGB 9.9* 11.2*  HCT 34.0* 33.0*  MCV 76.6*  --   PLT 181  --    Basic Metabolic Panel: Recent Labs  Lab 04/25/24 1613 04/25/24 1623  NA 143 145  K 3.7 3.6  CL 109 113*  CO2 16*  --   GLUCOSE 168* 166*  BUN 28* 28*  CREATININE 1.35* 1.20*  CALCIUM  9.4  --    GFR: Estimated Creatinine Clearance: 40.7 mL/min (A) (by C-G formula based on SCr of 1.2 mg/dL (H)). Liver Function Tests: Recent Labs  Lab 04/25/24 1613  AST 72*  ALT 48*  ALKPHOS 69  BILITOT 1.4*  PROT 6.6  ALBUMIN 4.0   Recent Labs  Lab 04/25/24 1613  LIPASE 21   No results for input(s): AMMONIA in the last 168 hours. Coagulation Profile: Recent Labs  Lab 04/25/24 1613  INR 2.6*   Cardiac Enzymes: Recent Labs  Lab 04/25/24 1613  CKTOTAL 915*  CKMB 26.9*   BNP (last 3 results) No results for input(s): PROBNP in the last 8760 hours. HbA1C: No results for input(s): HGBA1C in the last 72 hours. CBG: No results for input(s): GLUCAP in the last 168 hours. Lipid Profile: No results for input(s): CHOL, HDL, LDLCALC, TRIG, CHOLHDL, LDLDIRECT in the last 72 hours. Thyroid Function Tests: No results for input(s): TSH, T4TOTAL, FREET4, T3FREE, THYROIDAB in the last 72 hours. Anemia Panel: No results for input(s): VITAMINB12, FOLATE, FERRITIN, TIBC, IRON, RETICCTPCT in the last 72 hours. Urine analysis:    Component Value Date/Time   COLORURINE YELLOW 04/25/2024 1832   APPEARANCEUR CLEAR 04/25/2024 1832   LABSPEC 1.030 04/25/2024 1832   PHURINE 5.0 04/25/2024 1832   GLUCOSEU NEGATIVE 04/25/2024 1832   HGBUR MODERATE (A) 04/25/2024 1832   BILIRUBINUR NEGATIVE 04/25/2024 1832   BILIRUBINUR negative 10/10/2023 1041   BILIRUBINUR negative 11/18/2020 1718   KETONESUR  NEGATIVE 04/25/2024 1832   PROTEINUR 30 (A) 04/25/2024 1832   UROBILINOGEN 0.2 10/10/2023 1041   UROBILINOGEN 0.2 05/26/2011 2243   NITRITE  NEGATIVE 04/25/2024 1832   LEUKOCYTESUR NEGATIVE 04/25/2024 1832    Radiological Exams on Admission: CT CHEST ABDOMEN PELVIS W CONTRAST Result Date: 04/25/2024 CLINICAL DATA:  Blunt trauma EXAM: CT CHEST, ABDOMEN, AND PELVIS WITH CONTRAST TECHNIQUE: Multidetector CT imaging of the chest, abdomen and pelvis was performed following the standard protocol during bolus administration of intravenous contrast. RADIATION DOSE REDUCTION: This exam was performed according to the departmental dose-optimization program which includes automated exposure control, adjustment of the mA and/or kV according to patient size and/or use of iterative reconstruction technique. CONTRAST:  55mL OMNIPAQUE  IOHEXOL  350 MG/ML SOLN COMPARISON:  Chest radiograph same day, chest CT Dec 26, 2021 FINDINGS: CT CHEST FINDINGS Cardiovascular: The heart size is enlarged. Aneurysmal dilation of ascending aorta measuring 4.3 cm. Enlarged pulmonary artery measuring 3.6 cm. Status post CABG. No pericardial effusion. Atherosclerotic calcifications of coronary arteries. Mediastinum/Nodes: No suspicious lymphadenopathy. Few subcentimeter bilateral lower paratracheal lymph nodes likely reactive. Lungs/Pleura: Upper lobe predominant centrilobular emphysematous changes. Multi foci scarring/subsegmental atelectasis predominantly involving the right middle lobe and posteromedial left lower lobe likely sequela from chronic infection/inflammation, increased to prior. Progressive fibrotic changes and interlobular septal thickening in peripheral right upper and lower lobes. No posttraumatic changes of the lungs. There is posterior indentation/septation versus a diverticulum along the left lateral wall of the trachea in thoracic inlet. No pleural effusion. No pneumothorax. Musculoskeletal: No suspicious fracture or lytic  sclerotic osseous lesion. Median sternotomy. CT ABDOMEN PELVIS FINDINGS Hepatobiliary: No suspicious liver lesion. No intrahepatic biliary dilatation. Gallbladder is unremarkable. Common bile duct is dilated up to 7 mm. No definite choledocholithiasis identified. Pancreas: Pancreas is normal.  No pancreatic ductal dilatation. Spleen: Normal size spleen. Adrenals/Urinary Tract: Both adrenal glands are normal. Left kidney subcentimeter cortical cysts which does not require imaging follow-up no hydronephrosis.Bladder is unremarkable. Stomach/Bowel: Stomach and bowel loops appear normal. No bowel dilatation, obstruction or wall thickening. Large stool is identified throughout the colon. Normal appendix. Vascular/Lymphatic: Atherosclerotic calcifications of aorta. Aneurysmal dilation of infrarenal aorta measuring 3.2 x 3 x 2.8 cm which does not require imaging follow-up. No suspicious lymphadenopathy. Reproductive: Calcified uterine fibroids.  No adnexal mass. Other: Tiny fat containing umbilical hernia a small fat containing ventral wall hernia with a defect measuring 4 mm about 8 cm proximal to umbilicus. Musculoskeletal: Degenerative changes of the spine with compression fracture of T8 vertebral body (40% height reduction), new to 2023. IMPRESSION: Compression fracture of T8 vertebral body of indeterminate age. Please refer to same day dedicated CT. Aneurysmal dilation of ascending aorta and infrarenal abdominal aorta. Enlarged pulmonary artery which can be seen the setting of pulmonary artery hypertension. Stigmata of interstitial and chronic lung changes likely sequela from prior infection/inflammation, increased to prior exam from 2023. No suspicious pulmonary nodule. Additional findings as above. Electronically Signed   By: Megan  Zare M.D.   On: 04/25/2024 17:32   CT CERVICAL SPINE WO CONTRAST Result Date: 04/25/2024 EXAM: CT CERVICAL SPINE WITHOUT CONTRAST 04/25/2024 04:57:00 PM TECHNIQUE: CT of the cervical  spine was performed without the administration of intravenous contrast. Multiplanar reformatted images are provided for review. Automated exposure control, iterative reconstruction, and/or weight based adjustment of the mA/kV was utilized to reduce the radiation dose to as low as reasonably achievable. COMPARISON: None available. CLINICAL HISTORY: Polytrauma, blunt. Fall; CT CHEST ABDOMEN PELVIS W CONTRAST; Polytrauma, blunt; CT HEAD WO CONTRAST; Head trauma, moderate-severe; CT CERVICAL SPINE WO CONTRAST; Polytrauma, blunt; CT L-SPINE NO CHARGE; CT T-SPINE NO CHARGE. Omni 350 55mL; See ED Notes:;  Pt coming in after a fall pt takes elequis. Pt went to stand from wheelchair. Pt got light headed and dizzy and fell forward no syncope. ; Pt was down about 4 hours. Pt has abrasion to her chin. Pt has hx of stroke minor left sided weakness. Pt reports being sick FINDINGS: CERVICAL SPINE: BONES AND ALIGNMENT: No acute fracture or traumatic malalignment. Straightening of the normal cervical lordosis is stable. The patient is in a hard collar. DEGENERATIVE CHANGES: Mild endplate degenerative changes are again noted at C4-5, C5-6 and C6-7. SOFT TISSUES: No prevertebral soft tissue swelling. IMPRESSION: 1. No acute abnormality of the cervical spine related to the reported polytrauma and fall. 2. Mild endplate degenerative changes at C4-5, C5-6, and C6-7. Electronically signed by: Lonni Necessary MD 04/25/2024 05:28 PM EDT RP Workstation: [id number]   CT HEAD WO CONTRAST Result Date: 04/25/2024 EXAM: CT HEAD WITHOUT CONTRAST 04/25/2024 04:57:00 PM TECHNIQUE: CT of the head was performed without the administration of intravenous contrast. Automated exposure control, iterative reconstruction, and/or weight based adjustment of the mA/kV was utilized to reduce the radiation dose to as low as reasonably achievable. COMPARISON: None available. CLINICAL HISTORY: Head trauma, moderate-severe. Fall; CT CHEST ABDOMEN PELVIS W  CONTRAST; Polytrauma, blunt; CT HEAD WO CONTRAST; Head trauma, moderate-severe; CT CERVICAL SPINE WO CONTRAST; Polytrauma, blunt; CT L-SPINE NO CHARGE; CT T-SPINE NO CHARGE. Omni 350 55mL; See ED Notes:; Pt coming in after a fall pt takes elequis. Pt went to stand from wheelchair. Pt got light headed and dizzy and fell forward no syncope. ; Pt was down about 4 hours. Pt has abrasion to her chin. Pt has hx of stroke minor left sided weakness. Pt reports being sick FINDINGS: BRAIN AND VENTRICLES: Multiple remote cortical infarcts are again noted. Infarcts involve the posterior medial parietal lobes bilaterally, the inferior right temporal lobe and right occipital lobe in the anterior left middle frontal gyrus. Remote lacunar infarcts are present in the right caudate head, left lentiform nucleus and right thalamus. No acute hemorrhage. No evidence of acute infarct. No hydrocephalus. No extra-axial collection. No mass effect or midline shift. ORBITS: No acute abnormality. Soft tissue swelling extends to the periorbital regions bilaterally. SINUSES: Minimal fluid is present in the maxillary sinuses bilaterally without associated fracture. SOFT TISSUES AND SKULL: Frontal scalp soft tissue swelling is present without underlying fracture or foreign body. No skull fracture. IMPRESSION: 1. No acute intracranial abnormality related to the head trauma. 2. Frontal scalp and bilateral periorbital soft tissue swelling without underlying fracture or foreign body. 3. Multiple remote cortical and lacunar infarcts as described above. Electronically signed by: Lonni Necessary MD 04/25/2024 05:26 PM EDT RP Workstation: [id number]   CT L-SPINE NO CHARGE Result Date: 04/25/2024 EXAM: CT OF THE LUMBAR SPINE WITHOUT CONTRAST 04/25/2024 04:57:00 PM TECHNIQUE: CT of the lumbar spine was performed without the administration of intravenous contrast. Multiplanar reformatted images are provided for review. Automated exposure control,  iterative reconstruction, and/or weight based adjustment of the mA/kV was utilized to reduce the radiation dose to as low as reasonably achievable. COMPARISON: 02/13/2018. Images were not currently available. CLINICAL HISTORY: Fall; Polytrauma, blunt; Head trauma, moderate-severe; Pt coming in after a fall pt takes elequis. Pt went to stand from wheelchair. Pt got light headed and dizzy and fell forward no syncope. Pt was down about 4 hours. Pt has abrasion to her chin. Pt has hx of stroke minor left sided weakness. Pt reports being sick. FINDINGS: BONES AND ALIGNMENT: Normal vertebral body heights. No acute  fracture or suspicious bone lesion. Normal alignment. DEGENERATIVE CHANGES: No significant degenerative changes. SOFT TISSUES: No acute abnormality. VASCULATURE: Atherosclerotic calcifications present in the abdominal aorta. Maximal transverse diameter is 3.0 cm. 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 IMPRESSION: 1. No evidence of acute traumatic injury. 2. Atherosclerotic calcifications in the abdominal aorta with infrarenal abdominal aortic aneurysm. The maximal transverse diameter of 3.0 cm. Recommend CT angio of the abdomen and pelvis in 3 years. 3. 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 Electronically signed by: Lonni Necessary MD 04/25/2024 05:16 PM EDT RP Workstation: [id number]   CT T-SPINE NO CHARGE Result Date: 04/25/2024 EXAM: CT THORACIC SPINE WITHOUT CONTRAST 04/25/2024 04:57:00 PM TECHNIQUE: CT of the thoracic spine was performed without the administration of intravenous contrast. Multiplanar reformatted images are provided for review. Automated exposure control, iterative reconstruction, and/or weight based adjustment of the mA/kV was utilized to reduce the radiation dose to as low as reasonably achievable. COMPARISON: CT chest 12/26/2021. CLINICAL HISTORY: Patient with history of stroke and minor left-sided weakness, presenting after a fall from a  wheelchair with subsequent head trauma and chin abrasion. The patient was down for approximately 4 hours before seeking medical attention. No syncope reported. FINDINGS: BONES AND ALIGNMENT: Acute superior endplate compression fracture of T8 with 5 mm loss of height compared to prior study, approximately 30%. No retropulsed bone is present. No other acute fractures are present. Normal alignment. DEGENERATIVE CHANGES: No significant degenerative changes. SOFT TISSUES: No acute abnormality. VASCULATURE: Atherosclerotic changes are present throughout the thoracic descending thoracic aorta and visualized abdominal aorta without focal aneurysm. Please see CT chest for further description of the ascending aorta. Coronary artery calcifications are present. IMPRESSION: 1. Acute superior endplate compression fracture of T8 with 5 mm loss of height compared to prior study, approximately 30%. No retropulsed bone is present. No other acute fractures are present. Electronically signed by: Lonni Necessary MD 04/25/2024 05:12 PM EDT RP Workstation: [id number]   DG Pelvis Portable Result Date: 04/25/2024 CLINICAL DATA:  Trauma fall EXAM: PORTABLE PELVIS 1-2 VIEWS COMPARISON:  06/23/2022 FINDINGS: Extensive vascular calcification. SI joints are non widened. Pubic symphysis and rami appear intact. Slightly limited assessment of right femoral neck due to positioning. No definitive fracture or malalignment IMPRESSION: No definite acute osseous abnormality. Electronically Signed   By: Luke Bun M.D.   On: 04/25/2024 16:29   DG Chest Port 1 View Result Date: 04/25/2024 CLINICAL DATA:  Trauma fall EXAM: PORTABLE CHEST 1 VIEW COMPARISON:  06/23/2022, chest CT 12/26/2021 FINDINGS: Post sternotomy changes. Cardiomegaly with vascular congestion. Enlarged mediastinal silhouette compared to most recent prior radiograph but probably similar compared with scout CT image from May 2023. Aortic atherosclerosis. No pneumothorax.  IMPRESSION: Cardiomegaly with vascular congestion. Enlarged mediastinal silhouette compared to most recent prior radiograph, probably partially attributed to portable technique, however consider chest CT correlation to exclude acute mediastinal pathology as patient has history of known ascending aortic aneurysm on the prior CT Electronically Signed   By: Luke Bun M.D.   On: 04/25/2024 16:28    EKG: Independently reviewed.   Assessment/Plan   Sepsis unknown source  ( Lactic acidosis, leukocytosis, tachycardia)  -admit to progressive care   Near Syncope with Fall  -hx of postural syncope  - gentle ivfs  - can attempt orthostatics in am after ivfs over night    NSTEMI presumed TypeII r/o Type1 -patient baseline CE 200, now500 -no complaint of chest pain  -EKG unchanged from prior  -awaiting  cardiology rec re further treatment  - echo in am  -continue to cycle CE   Acute superior endplate compression fracture of T8    AKI   Mild rhabdomyolysis  -continue on ivfs   Mild elevation in LFTS  -presumed related to isoenzyme  -ct abd -notes no abnormality    abdominal aorta with infrarenal abdominal aortic aneurysm. The maximal transverse diameter of 3.0 cm. Recommend CT angio of the abdomen and pelvis in 3 years. DVT prophylaxis: *** (Lovenox /Heparin /SCD's/anticoagulated/None (if comfort care) Code Status: *** (Full/Partial (specify details) Family Communication: *** (Specify name, relationship. Do not write discussed with patient. Specify tel # if discussed over the phone) Disposition Plan: *** (specify when and where you expect patient to be discharged) Consults called: *** (with names) Admission status: *** (inpatient / obs / tele / medical floor / SDU)   Camila DELENA Ned MD [facility] Pager [phone]  If 7PM-7AM, please contact night-coverage [email or web address] Password [email or web address]  04/25/2024, 8:00 PM

## 2024-04-25 NOTE — ED Notes (Signed)
 Pt daughter arrived. Pt daughter is concerned for a stroke. Pt daughter reports she has had a stroke before so it is probably that again this time

## 2024-04-26 DIAGNOSIS — K7201 Acute and subacute hepatic failure with coma: Secondary | ICD-10-CM

## 2024-04-26 DIAGNOSIS — N179 Acute kidney failure, unspecified: Secondary | ICD-10-CM

## 2024-04-26 DIAGNOSIS — A419 Sepsis, unspecified organism: Secondary | ICD-10-CM

## 2024-04-26 DIAGNOSIS — R7989 Other specified abnormal findings of blood chemistry: Secondary | ICD-10-CM

## 2024-04-26 DIAGNOSIS — W19XXXA Unspecified fall, initial encounter: Secondary | ICD-10-CM

## 2024-04-26 DIAGNOSIS — I2510 Atherosclerotic heart disease of native coronary artery without angina pectoris: Secondary | ICD-10-CM

## 2024-04-26 DIAGNOSIS — E872 Acidosis, unspecified: Principal | ICD-10-CM

## 2024-04-26 DIAGNOSIS — R55 Syncope and collapse: Secondary | ICD-10-CM | POA: Diagnosis not present

## 2024-04-26 DIAGNOSIS — I480 Paroxysmal atrial fibrillation: Secondary | ICD-10-CM

## 2024-04-26 DIAGNOSIS — M6282 Rhabdomyolysis: Secondary | ICD-10-CM

## 2024-04-26 DIAGNOSIS — R6520 Severe sepsis without septic shock: Secondary | ICD-10-CM

## 2024-04-26 LAB — CBC
HCT: 30.7 % — ABNORMAL LOW (ref 36.0–46.0)
Hemoglobin: 9.5 g/dL — ABNORMAL LOW (ref 12.0–15.0)
MCH: 22.4 pg — ABNORMAL LOW (ref 26.0–34.0)
MCHC: 30.9 g/dL (ref 30.0–36.0)
MCV: 72.4 fL — ABNORMAL LOW (ref 80.0–100.0)
Platelets: 182 K/uL (ref 150–400)
RBC: 4.24 MIL/uL (ref 3.87–5.11)
RDW: 16.5 % — ABNORMAL HIGH (ref 11.5–15.5)
WBC: 17.4 K/uL — ABNORMAL HIGH (ref 4.0–10.5)
nRBC: 0 % (ref 0.0–0.2)

## 2024-04-26 LAB — LACTIC ACID, PLASMA
Lactic Acid, Venous: 3.2 mmol/L (ref 0.5–1.9)
Lactic Acid, Venous: 2.9 mmol/L (ref 0.5–1.9)

## 2024-04-26 LAB — COMPREHENSIVE METABOLIC PANEL WITH GFR
ALT: 46 U/L — ABNORMAL HIGH (ref 0–44)
AST: 88 U/L — ABNORMAL HIGH (ref 15–41)
Albumin: 3.4 g/dL — ABNORMAL LOW (ref 3.5–5.0)
Alkaline Phosphatase: 60 U/L (ref 38–126)
Anion gap: 12 (ref 5–15)
BUN: 27 mg/dL — ABNORMAL HIGH (ref 8–23)
CO2: 16 mmol/L — ABNORMAL LOW (ref 22–32)
Calcium: 8.2 mg/dL — ABNORMAL LOW (ref 8.9–10.3)
Chloride: 113 mmol/L — ABNORMAL HIGH (ref 98–111)
Creatinine, Ser: 1.17 mg/dL — ABNORMAL HIGH (ref 0.44–1.00)
GFR, Estimated: 51 mL/min — ABNORMAL LOW (ref >60)
Glucose, Bld: 163 mg/dL — ABNORMAL HIGH (ref 70–99)
Potassium: 4.1 mmol/L (ref 3.5–5.1)
Sodium: 141 mmol/L (ref 135–145)
Total Bilirubin: 0.6 mg/dL (ref 0.0–1.2)
Total Protein: 6 g/dL — ABNORMAL LOW (ref 6.5–8.1)

## 2024-04-26 LAB — RESP PANEL BY RT-PCR (RSV, FLU A&B, COVID)  RVPGX2
Influenza A by PCR: NEGATIVE
Influenza B by PCR: NEGATIVE
Resp Syncytial Virus by PCR: NEGATIVE
SARS Coronavirus 2 by RT PCR: NEGATIVE

## 2024-04-26 LAB — HEPARIN LEVEL (UNFRACTIONATED): Heparin Unfractionated: >1.1 [IU]/mL — ABNORMAL HIGH (ref 0.30–0.70)

## 2024-04-26 LAB — CBG MONITORING, ED: Glucose-Capillary: 126 mg/dL — ABNORMAL HIGH (ref 70–99)

## 2024-04-26 LAB — CK: Total CK: 2468 U/L — ABNORMAL HIGH (ref 38–234)

## 2024-04-26 LAB — TROPONIN I (HIGH SENSITIVITY)
Troponin I (High Sensitivity): 1443 ng/L (ref <18)
Troponin I (High Sensitivity): 1833 ng/L (ref <18)
Troponin I (High Sensitivity): 1809 ng/L (ref <18)
Troponin I (High Sensitivity): 1499 ng/L (ref <18)

## 2024-04-26 LAB — APTT: aPTT: 42 s — ABNORMAL HIGH (ref 24–36)

## 2024-04-26 MED ORDER — TRIPLE ANTIBIOTIC 3.5-400-5000 EX OINT
1.0000 | TOPICAL_OINTMENT | Freq: Two times a day (BID) | CUTANEOUS | Status: DC
  Administered 2024-04-26 – 2024-05-03 (×15): 1 via CUTANEOUS
  Filled 2024-04-26 (×16): qty 1

## 2024-04-26 MED ORDER — SODIUM CHLORIDE 0.9 % IV SOLN
2.0000 g | Freq: Two times a day (BID) | INTRAVENOUS | Status: AC
  Administered 2024-04-26 – 2024-04-30 (×10): 2 g via INTRAVENOUS
  Filled 2024-04-26 (×10): qty 12.5

## 2024-04-26 MED ORDER — ASPIRIN 81 MG PO CHEW
324.0000 mg | CHEWABLE_TABLET | Freq: Once | ORAL | Status: AC
  Administered 2024-04-26: 324 mg via ORAL
  Filled 2024-04-26: qty 4

## 2024-04-26 MED ORDER — SODIUM CHLORIDE 0.9 % IV BOLUS
500.0000 mL | Freq: Once | INTRAVENOUS | Status: AC
  Administered 2024-04-26: 500 mL via INTRAVENOUS

## 2024-04-26 MED ORDER — HEPARIN (PORCINE) 25000 UT/250ML-% IV SOLN
800.0000 [IU]/h | INTRAVENOUS | Status: AC
  Administered 2024-04-26: 850 [IU]/h via INTRAVENOUS
  Administered 2024-04-27: 800 [IU]/h via INTRAVENOUS
  Filled 2024-04-26 (×2): qty 250

## 2024-04-26 MED ORDER — METOPROLOL TARTRATE 5 MG/5ML IV SOLN
5.0000 mg | Freq: Once | INTRAVENOUS | Status: AC
  Administered 2024-04-26: 5 mg via INTRAVENOUS
  Filled 2024-04-26: qty 5

## 2024-04-26 MED ORDER — SODIUM CHLORIDE 0.9 % IV SOLN
500.0000 mg | INTRAVENOUS | Status: DC
  Administered 2024-04-26 – 2024-04-29 (×4): 500 mg via INTRAVENOUS
  Filled 2024-04-26 (×4): qty 5

## 2024-04-26 MED ORDER — HEPARIN BOLUS VIA INFUSION
2000.0000 [IU] | Freq: Once | INTRAVENOUS | Status: AC
  Administered 2024-04-26: 2000 [IU] via INTRAVENOUS
  Filled 2024-04-26: qty 2000

## 2024-04-26 MED ORDER — ASPIRIN 81 MG PO TBEC
81.0000 mg | DELAYED_RELEASE_TABLET | Freq: Every day | ORAL | Status: DC
  Administered 2024-04-27 – 2024-05-03 (×7): 81 mg via ORAL
  Filled 2024-04-26 (×7): qty 1

## 2024-04-26 MED ORDER — CARVEDILOL 12.5 MG PO TABS
12.5000 mg | ORAL_TABLET | Freq: Two times a day (BID) | ORAL | Status: DC
Start: 2024-04-26 — End: 2024-04-30
  Administered 2024-04-26 – 2024-04-30 (×8): 12.5 mg via ORAL
  Filled 2024-04-26 (×8): qty 1

## 2024-04-26 MED ORDER — VANCOMYCIN HCL 750 MG/150ML IV SOLN
750.0000 mg | INTRAVENOUS | Status: DC
  Administered 2024-04-26 – 2024-04-28 (×3): 750 mg via INTRAVENOUS
  Filled 2024-04-26 (×4): qty 150

## 2024-04-26 NOTE — ED Notes (Signed)
 Eating and drinking. Will not keep arm strait 2/2 eating/ using arm, pump alarm continuous.

## 2024-04-26 NOTE — ED Notes (Signed)
Secretary paged provider 

## 2024-04-26 NOTE — Progress Notes (Signed)
 PHARMACY - ANTICOAGULATION CONSULT NOTE  Pharmacy Consult for heparin  Indication: chest pain/ACS, atrial fibrillation  Allergies  Allergen Reactions   Catapres -Tts [Clonidine ] Other (See Comments)    Reaction caused by clonidine  patch but not a direct result of the medication Patch adhesive caused skin tears resulting in scarring   Aldactone  [Spironolactone ] Rash    Patient Measurements: Height: 5' 7 (170.2 cm) Weight: 56.7 kg (125 lb) IBW/kg (Calculated) : 61.6 HEPARIN  DW (KG): 56.7  Vital Signs: Temp: 97.9 F (36.6 C) (09/13 1424) Temp Source: Oral (09/13 1424) BP: 112/89 (09/13 1424) Pulse Rate: 112 (09/13 1424)  Labs: Recent Labs    04/25/24 1613 04/25/24 1623 04/25/24 1832 04/25/24 2309 04/26/24 0155 04/26/24 0337 04/26/24 0850 04/26/24 1507  HGB 9.9* 11.2*  --   --  9.5*  --   --   --   HCT 34.0* 33.0*  --   --  30.7*  --   --   --   PLT 181  --   --   --  182  --   --   --   APTT 36  --   --   --   --   --   --  42*  LABPROT 29.2*  --   --   --   --   --   --   --   INR 2.6*  --   --   --   --   --   --   --   HEPARINUNFRC  --   --   --   --   --   --   --  >1.10*  CREATININE 1.35* 1.20*  --   --   --  1.17*  --   --   CKTOTAL 915*  --   --   --   --   --  2,468*  --   CKMB 26.9*  --   --   --   --   --   --   --   TROPONINIHS 235*  --    < > 1,443* 1,833*  --  1,809*  --    < > = values in this interval not displayed.    Estimated Creatinine Clearance: 41.8 mL/min (A) (by C-G formula based on SCr of 1.17 mg/dL (H)).  Assessment: 34 yoF presented to ED after syncopal episode. Pharmacy consulted to dose heparin  for ACS. PMH includes afib (Eliquis  at home)  -Hgb 9.5, plts 182 -Trop 1800 -Last dose of Eliquis  9/12 @0830   PM update: HL > 1.1 as expected with recent eliquis . aPTT subtherapeutic at 42 on heparin  850 units/hr. No issues with the infusion or bleeding reported per RN.  Goal of Therapy:  Heparin  level 0.3-0.7 units/ml aPTT 66-102  seconds Monitor platelets by anticoagulation protocol: Yes   Plan:  Give heparin  2000 units IV bolus Increase heparin  infusion to 1000 units/hr Check aPTT and anti-Xa level in 8 hours and daily while on heparin  Monitor with aPTT until correlates with anti-Xa level Continue to monitor H&H and platelets F/u cards consult  Rocky Slade, PharmD, BCPS Clinical Pharmacist 04/26/2024 4:29 PM   Please check [facility] for all [facility] phone numbers After 10:00 PM, call Main Pharmacy [phone]

## 2024-04-26 NOTE — Consult Note (Signed)
 Cardiology Consultation   Patient ID: [name] MRN: [id number]; DOB: [birth year]  Admit date: 04/25/2024 Date of Consult: 04/26/2024  PCP:  Georgina Speaks, FNP   [facility] Providers Cardiologist:  Lynwood Schilling, MD        Patient Profile: Kimberly [name] is a 68 y.o. 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  History of Present Illness: Kimberly [name] is a 68 y.o. female with a hx of CAD s/p CABG x 18 May 2007 , apical variant hypertrophic cardiomyopathy, paroxysmal A-fib, history of CVA in 2019 status post left-sided weakness, occluded bilateral internal carotid artery, multiple other comorbidities such as hypertension, hyperlipidemia, former tobacco use who is being seen 04/26/2024 for the evaluation of elevated troponin   Patient presenting to the hospital with episode of dizziness,and a mechanical fall.  She reports she did not lose consciousness, but reports was significantly dizzy dizzy, was found on the floor for 4 hours complaining of generalized weakness, fatigue, brought to the ER.  She denies any chest pain but does report some palpitations prior to dizziness and fall. Denies orthopnea or PND, she is very poor historian. Baseline functional status is poor, walks with walker, has left-sided residual weakness  In the ER blood pressure was slightly elevated.  Labs shows creatinine 1.35, elevated, mild elevation in LFTs,  troponin 235 elevated 526 then elevated t->1443-->1833 Total CK9 1 5 EKG  atrial fibrillation, nonspecific ST-T wave changes Lactic acid 5.4-->3.3 Procalcitonin 8.9 Chest x-ray shows  cardiomegaly with some vascular congestion  CT chest abdomen pelvis shows T8 vertebral fracture, aneurysmal dilation of ascending aorta) 4.3 cm Infrarenal abdominal aorta measuring 3.2 x 2.8 cm  Prior cardiac workup, EKG 09/2023 shows diffuse T wave inversions, anterior T wave inversions could be secondary to apical HCM, LVH criteria.  Echo 2023 shows LVEF 60 to 65%, severe LVH and apical segment consistent with apical HCM, RV is moderate to severely reduced, biatrial enlargement, mild MR, aortic root dilation 4.2 cm, RVSP 59 mmHg, ascending aorta 4.4cm  Stress test 2021 large defect in basal inferolateral, basal anterolateral, findings consistent with prior MI with peri-infarct ischemia, interemediate this study  Vascular ultrasound 2021 occluded bilateral ICA as demonstrated previously 2019, antegrade flow in vertebral arteries   Past Medical History:  Diagnosis Date   Bicipital tenosynovitis    Cerebral artery occlusion with cerebral infarction (HCC) 12/05/2007   Qualifier: Diagnosis of   By: Loretha MD, [place]       Coronary artery disease 05/2007   [id number]    Fall 02/13/2018   Family history of ischemic heart disease    Gout    Hammer toe    Hip, thigh, leg, and ankle, insect bite, nonvenomous, without mention of infection(916.4)    Hyperlipidemia    Hypertension    Osteoporosis 02/07/2022   Other abnormality of red blood cells    Rotator cuff syndrome of left shoulder    Stroke [facility])     Past Surgical History:  Procedure Laterality Date   CARDIAC CATHETERIZATION     COLONOSCOPY WITH PROPOFOL  N/A 01/30/2020   Procedure: COLONOSCOPY WITH PROPOFOL ;  Surgeon: Rollin Dover, MD;  Location: [facility];  Service: Endoscopy;  Laterality: N/A;   CORONARY ARTERY BYPASS GRAFT     triple   CORONARY ARTERY BYPASS GRAFT  2008   POLYPECTOMY  01/30/2020   Procedure: POLYPECTOMY;  Surgeon: Rollin Dover, MD;  Location: [facility];  Service: Endoscopy;;   TUBAL LIGATION        Home Medications:  Prior to Admission medications   Medication Sig Start Date End Date Taking? Authorizing Provider  albuterol  (VENTOLIN  HFA) 108 (90 Base) MCG/ACT inhaler INHALE 2 PUFFS BY MOUTH INTO THE LUNGS EVERY 6 HOURS AS NEEDED FOR WHEEZING OR SHORTNESS OF BREATH 07/31/23  Yes Byrum, Lamar RAMAN, MD  alendronate  (FOSAMAX ) 70 MG tablet TAKE 1 TABLET BY MOUTH EVERY 7 DAYS. Take WITH A full GLASS OF WATER ON EMPTY stomach 03/24/24  Yes Georgina Speaks, FNP  amLODipine  (NORVASC ) 5 MG tablet TAKE 1 TABLET BY MOUTH EVERY DAY 08/14/23  Yes Lavona Agent, MD  ANORO ELLIPTA  62.5-25 MCG/ACT AEPB INHALE 1 PUFF BY MOUTH INTO THE LUNGS ONCE DAILY. RINSE MOUTH AFTER USE 12/18/23  Yes Georgina Speaks, FNP  atorvastatin  (LIPITOR ) 80 MG tablet TAKE 1 TABLET BY MOUTH EVERY DAY 04/17/24  Yes Georgina Speaks, FNP  benzonatate  (TESSALON  PERLES) 100 MG capsule Take 1 capsule (100 mg total) by mouth 3 (three) times daily as needed. 04/02/24 04/02/25 Yes Petrina Pries, NP  carvedilol  (COREG ) 12.5 MG tablet TAKE 1 TABLET BY MOUTH TWICE DAILY WITH MEALS 03/24/24  Yes Georgina Speaks, FNP  ELIQUIS  5 MG TABS tablet TAKE 1 TABLET BY MOUTH 2 TIMES DAILY 04/17/24  Yes Moore, [name], FNP  Ferric Maltol  (ACCRUFER ) 30 MG CAPS Take 1 capsule (30 mg total) by mouth daily. 04/21/24  Yes Petrina Pries, NP  fluticasone  (FLONASE ) 50 MCG/ACT nasal spray SPRAY 2 SPRAYS into EACH nostril DAILY Patient taking differently: Place 2 sprays into both nostrils daily as needed for allergies. 09/27/22  Yes Moore, [name], FNP  hydrocortisone cream 1 % Apply 1 application topically daily as needed for itching.   Yes [provider]  mirtazapine  (REMERON ) 15 MG tablet TAKE 1 TABLET BY MOUTH AT BEDTIME 03/24/24  Yes Georgina Speaks, FNP  pantoprazole  (PROTONIX ) 40 MG tablet TAKE 1 TABLET BY MOUTH EVERY DAY 03/24/24  Yes Georgina Speaks, FNP  Vitamin D , Ergocalciferol , (DRISDOL ) 1.25 MG (50000 UNIT) CAPS capsule TAKE 1 CAPSULE BY MOUTH TWICE A WEEK 03/24/24  Yes  Moore, [name], FNP  furosemide  (LASIX ) 20 MG tablet Take 1 tablet every other day for one week 10/11/23   Georgina Speaks, FNP  lidocaine  (LIDODERM ) 5 % APPLY 1 PATCH TO SKIN DAILY Patient not taking: Reported on 04/25/2024 09/07/22   Georgina Speaks, FNP  nicotine  (NICODERM CQ  - DOSED IN MG/24 HOURS) 14 mg/24hr patch Place 1 patch (14 mg total) onto the skin daily. Patient not taking: Reported on 04/25/2024 08/15/22   Georgina Speaks, FNP    Scheduled Meds:  umeclidinium-vilanterol  1 puff Inhalation Daily   Continuous Infusions:  sodium chloride  125 mL/hr at 04/25/24 2110   PRN Meds: acetaminophen  **OR** acetaminophen , albuterol , ondansetron  **OR** ondansetron  (ZOFRAN ) IV, oxyCODONE   Allergies:    Allergies  Allergen Reactions   Clonidine  Derivatives Other (See Comments)    Patch causes scars   Spironolactone  Rash    Social History:   Social History   Socioeconomic History   Marital status: Single    Spouse name: Not on file   Number of children: 2   Years of education: Not on file   Highest education level: 12th grade  Occupational History  Occupation: retired  Tobacco Use   Smoking status: Former    Current packs/day: 0.00    Average packs/day: 2.0 packs/day for 35.0 years (70.0 ttl pk-yrs)    Types: Cigarettes    Start date: 08/11/1984    Quit date: 08/12/2019    Years since quitting: 4.7   Smokeless tobacco: Former    Quit date: 08/09/2008   Tobacco comments:    Stopped smoking in May 2022 ARJ   Vaping Use   Vaping status: Never Used  Substance and Sexual Activity   Alcohol use: Not Currently    Comment: ocassionally wine   Drug use: Not Currently    Types: Marijuana    Comment: history of cocaine use   Sexual activity: Not on file  Other Topics Concern   Not on file  Social History Narrative   Patient is right-handed. She lives alone in a one level home, 2 steps to enter. Her family is very active in her care.    Social Drivers of Research scientist (physical sciences) Strain: Low Risk  (09/21/2023)   Overall Financial Resource Strain (CARDIA)    Difficulty of Paying Living Expenses: Not very hard  Food Insecurity: No Food Insecurity (09/21/2023)   Hunger Vital Sign    Worried About Running Out of Food in the Last Year: Never true    Ran Out of Food in the Last Year: Never true  Transportation Needs: No Transportation Needs (09/21/2023)   PRAPARE - Administrator, Civil Service (Medical): No    Lack of Transportation (Non-Medical): No  Physical Activity: Unknown (09/21/2023)   Exercise Vital Sign    Days of Exercise per Week: 0 days    Minutes of Exercise per Session: Not on file  Stress: No Stress Concern Present (09/21/2023)   [employer] of Occupational Health - Occupational Stress Questionnaire    Feeling of Stress : Only a little  Social Connections: Socially Isolated (09/21/2023)   Social Connection and Isolation Panel    Frequency of Communication with Friends and Family: More than three times a week    Frequency of Social Gatherings with Friends and Family: Once a week    Attends Religious Services: Never    Database administrator or Organizations: No    Attends Engineer, structural: Not on file    Marital Status: Never married  Intimate Partner Violence: Not At Risk (09/21/2023)   Humiliation, Afraid, Rape, and Kick questionnaire    Fear of Current or Ex-Partner: No    Emotionally Abused: No    Physically Abused: No    Sexually Abused: No    Family History:    Family History  Problem Relation Age of Onset   Hypertension Mother    Heart attack Father    Diabetes Father    Kidney failure Father    Anxiety disorder Sister    Sarcoidosis Sister      ROS:  Please see the history of present illness.   All other ROS reviewed and negative.     Physical Exam/Data: Vitals:   04/26/24 0430 04/26/24 0445 04/26/24 0500 04/26/24 0515  BP: 107/86 (!) 111/93 (!) 111/96 119/81  Pulse:  (!) 110 (!) 118 (!) 117   Resp: 20 15 13 17   Temp:      TempSrc:      SpO2:  98% 96% 98%  Weight:      Height:        Intake/Output Summary (Last 24 hours)  at 04/26/2024 0546 Last data filed at 04/25/2024 1945 Gross per 24 hour  Intake 1175.04 ml  Output 0 ml  Net 1175.04 ml      04/25/2024    4:25 PM 04/02/2024    2:59 PM 10/09/2023    3:39 PM  Last 3 Weights  Weight (lbs) 125 lb 130 lb 139 lb 3.2 oz  Weight (kg) 56.7 kg 58.968 kg 63.141 kg     Body mass index is 19.58 kg/m.  General:  Well nourished, well developed, in no acute distress HEENT: normal Neck: no JVD Vascular: No carotid bruits; Distal pulses 2+ bilaterally Cardiac:  normal S1, S2; RRR; no murmur  Lungs:  clear to auscultation bilaterally, no wheezing, rhonchi or rales  Abd: soft, nontender, no hepatomegaly  Ext: no edema Musculoskeletal:  No deformities, BUE and BLE strength normal and equal Skin: warm and dry  Neuro:  CNs 2-12 intact, no focal abnormalities noted Psych:  Normal affect     Laboratory Data: High Sensitivity Troponin:   Recent Labs  Lab 04/25/24 1613 04/25/24 1832 04/25/24 2309 04/26/24 0155  TROPONINIHS 235* 526* 1,443* 1,833*     Chemistry Recent Labs  Lab 04/25/24 1613 04/25/24 1623  NA 143 145  K 3.7 3.6  CL 109 113*  CO2 16*  --   GLUCOSE 168* 166*  BUN 28* 28*  CREATININE 1.35* 1.20*  CALCIUM  9.4  --   GFRNONAA 43*  --   ANIONGAP 18*  --     Recent Labs  Lab 04/25/24 1613  PROT 6.6  ALBUMIN 4.0  AST 72*  ALT 48*  ALKPHOS 69  BILITOT 1.4*   Lipids No results for input(s): CHOL, TRIG, HDL, LABVLDL, LDLCALC, CHOLHDL in the last 168 hours.  Hematology Recent Labs  Lab 04/25/24 1613 04/25/24 1623 04/26/24 0155  WBC 18.4*  --  17.4*  RBC 4.44  --  4.24  HGB 9.9* 11.2* 9.5*  HCT 34.0* 33.0* 30.7*  MCV 76.6*  --  72.4*  MCH 22.3*  --  22.4*  MCHC 29.1*  --  30.9  RDW 16.6*  --  16.5*  PLT 181  --  182   Thyroid No results for input(s): TSH, FREET4 in the  last 168 hours.  BNPNo results for input(s): BNP, PROBNP in the last 168 hours.  DDimer No results for input(s): DDIMER in the last 168 hours.  Radiology/Studies:  CT CHEST ABDOMEN PELVIS W CONTRAST Result Date: 04/25/2024 CLINICAL DATA:  Blunt trauma EXAM: CT CHEST, ABDOMEN, AND PELVIS WITH CONTRAST TECHNIQUE: Multidetector CT imaging of the chest, abdomen and pelvis was performed following the standard protocol during bolus administration of intravenous contrast. RADIATION DOSE REDUCTION: This exam was performed according to the departmental dose-optimization program which includes automated exposure control, adjustment of the mA and/or kV according to patient size and/or use of iterative reconstruction technique. CONTRAST:  55mL OMNIPAQUE  IOHEXOL  350 MG/ML SOLN COMPARISON:  Chest radiograph same day, chest CT Dec 26, 2021 FINDINGS: CT CHEST FINDINGS Cardiovascular: The heart size is enlarged. Aneurysmal dilation of ascending aorta measuring 4.3 cm. Enlarged pulmonary artery measuring 3.6 cm. Status post CABG. No pericardial effusion. Atherosclerotic calcifications of coronary arteries. Mediastinum/Nodes: No suspicious lymphadenopathy. Few subcentimeter bilateral lower paratracheal lymph nodes likely reactive. Lungs/Pleura: Upper lobe predominant centrilobular emphysematous changes. Multi foci scarring/subsegmental atelectasis predominantly involving the right middle lobe and posteromedial left lower lobe likely sequela from chronic infection/inflammation, increased to prior. Progressive fibrotic changes and interlobular septal thickening in peripheral right upper and  lower lobes. No posttraumatic changes of the lungs. There is posterior indentation/septation versus a diverticulum along the left lateral wall of the trachea in thoracic inlet. No pleural effusion. No pneumothorax. Musculoskeletal: No suspicious fracture or lytic sclerotic osseous lesion. Median sternotomy. CT ABDOMEN PELVIS FINDINGS  Hepatobiliary: No suspicious liver lesion. No intrahepatic biliary dilatation. Gallbladder is unremarkable. Common bile duct is dilated up to 7 mm. No definite choledocholithiasis identified. Pancreas: Pancreas is normal.  No pancreatic ductal dilatation. Spleen: Normal size spleen. Adrenals/Urinary Tract: Both adrenal glands are normal. Left kidney subcentimeter cortical cysts which does not require imaging follow-up no hydronephrosis.Bladder is unremarkable. Stomach/Bowel: Stomach and bowel loops appear normal. No bowel dilatation, obstruction or wall thickening. Large stool is identified throughout the colon. Normal appendix. Vascular/Lymphatic: Atherosclerotic calcifications of aorta. Aneurysmal dilation of infrarenal aorta measuring 3.2 x 3 x 2.8 cm which does not require imaging follow-up. No suspicious lymphadenopathy. Reproductive: Calcified uterine fibroids.  No adnexal mass. Other: Tiny fat containing umbilical hernia a small fat containing ventral wall hernia with a defect measuring 4 mm about 8 cm proximal to umbilicus. Musculoskeletal: Degenerative changes of the spine with compression fracture of T8 vertebral body (40% height reduction), new to 2023. IMPRESSION: Compression fracture of T8 vertebral body of indeterminate age. Please refer to same day dedicated CT. Aneurysmal dilation of ascending aorta and infrarenal abdominal aorta. Enlarged pulmonary artery which can be seen the setting of pulmonary artery hypertension. Stigmata of interstitial and chronic lung changes likely sequela from prior infection/inflammation, increased to prior exam from 2023. No suspicious pulmonary nodule. Additional findings as above. Electronically Signed   By: Megan  Zare M.D.   On: 04/25/2024 17:32   CT CERVICAL SPINE WO CONTRAST Result Date: 04/25/2024 EXAM: CT CERVICAL SPINE WITHOUT CONTRAST 04/25/2024 04:57:00 PM TECHNIQUE: CT of the cervical spine was performed without the administration of intravenous contrast.  Multiplanar reformatted images are provided for review. Automated exposure control, iterative reconstruction, and/or weight based adjustment of the mA/kV was utilized to reduce the radiation dose to as low as reasonably achievable. COMPARISON: None available. CLINICAL HISTORY: Polytrauma, blunt. Fall; CT CHEST ABDOMEN PELVIS W CONTRAST; Polytrauma, blunt; CT HEAD WO CONTRAST; Head trauma, moderate-severe; CT CERVICAL SPINE WO CONTRAST; Polytrauma, blunt; CT L-SPINE NO CHARGE; CT T-SPINE NO CHARGE. Omni 350 55mL; See ED Notes:; Pt coming in after a fall pt takes elequis. Pt went to stand from wheelchair. Pt got light headed and dizzy and fell forward no syncope. ; Pt was down about 4 hours. Pt has abrasion to her chin. Pt has hx of stroke minor left sided weakness. Pt reports being sick FINDINGS: CERVICAL SPINE: BONES AND ALIGNMENT: No acute fracture or traumatic malalignment. Straightening of the normal cervical lordosis is stable. The patient is in a hard collar. DEGENERATIVE CHANGES: Mild endplate degenerative changes are again noted at C4-5, C5-6 and C6-7. SOFT TISSUES: No prevertebral soft tissue swelling. IMPRESSION: 1. No acute abnormality of the cervical spine related to the reported polytrauma and fall. 2. Mild endplate degenerative changes at C4-5, C5-6, and C6-7. Electronically signed by: Lonni Necessary MD 04/25/2024 05:28 PM EDT RP Workstation: [id number]   CT HEAD WO CONTRAST Result Date: 04/25/2024 EXAM: CT HEAD WITHOUT CONTRAST 04/25/2024 04:57:00 PM TECHNIQUE: CT of the head was performed without the administration of intravenous contrast. Automated exposure control, iterative reconstruction, and/or weight based adjustment of the mA/kV was utilized to reduce the radiation dose to as low as reasonably achievable. COMPARISON: None available. CLINICAL HISTORY: Head trauma, moderate-severe. Fall;  CT CHEST ABDOMEN PELVIS W CONTRAST; Polytrauma, blunt; CT HEAD WO CONTRAST; Head trauma,  moderate-severe; CT CERVICAL SPINE WO CONTRAST; Polytrauma, blunt; CT L-SPINE NO CHARGE; CT T-SPINE NO CHARGE. Omni 350 55mL; See ED Notes:; Pt coming in after a fall pt takes elequis. Pt went to stand from wheelchair. Pt got light headed and dizzy and fell forward no syncope. ; Pt was down about 4 hours. Pt has abrasion to her chin. Pt has hx of stroke minor left sided weakness. Pt reports being sick FINDINGS: BRAIN AND VENTRICLES: Multiple remote cortical infarcts are again noted. Infarcts involve the posterior medial parietal lobes bilaterally, the inferior right temporal lobe and right occipital lobe in the anterior left middle frontal gyrus. Remote lacunar infarcts are present in the right caudate head, left lentiform nucleus and right thalamus. No acute hemorrhage. No evidence of acute infarct. No hydrocephalus. No extra-axial collection. No mass effect or midline shift. ORBITS: No acute abnormality. Soft tissue swelling extends to the periorbital regions bilaterally. SINUSES: Minimal fluid is present in the maxillary sinuses bilaterally without associated fracture. SOFT TISSUES AND SKULL: Frontal scalp soft tissue swelling is present without underlying fracture or foreign body. No skull fracture. IMPRESSION: 1. No acute intracranial abnormality related to the head trauma. 2. Frontal scalp and bilateral periorbital soft tissue swelling without underlying fracture or foreign body. 3. Multiple remote cortical and lacunar infarcts as described above. Electronically signed by: Lonni Necessary MD 04/25/2024 05:26 PM EDT RP Workstation: [id number]   CT L-SPINE NO CHARGE Result Date: 04/25/2024 EXAM: CT OF THE LUMBAR SPINE WITHOUT CONTRAST 04/25/2024 04:57:00 PM TECHNIQUE: CT of the lumbar spine was performed without the administration of intravenous contrast. Multiplanar reformatted images are provided for review. Automated exposure control, iterative reconstruction, and/or weight based adjustment of the  mA/kV was utilized to reduce the radiation dose to as low as reasonably achievable. COMPARISON: 02/13/2018. Images were not currently available. CLINICAL HISTORY: Fall; Polytrauma, blunt; Head trauma, moderate-severe; Pt coming in after a fall pt takes elequis. Pt went to stand from wheelchair. Pt got light headed and dizzy and fell forward no syncope. Pt was down about 4 hours. Pt has abrasion to her chin. Pt has hx of stroke minor left sided weakness. Pt reports being sick. FINDINGS: BONES AND ALIGNMENT: Normal vertebral body heights. No acute fracture or suspicious bone lesion. Normal alignment. DEGENERATIVE CHANGES: No significant degenerative changes. SOFT TISSUES: No acute abnormality. VASCULATURE: Atherosclerotic calcifications present in the abdominal aorta. Maximal transverse diameter is 3.0 cm. 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 IMPRESSION: 1. No evidence of acute traumatic injury. 2. Atherosclerotic calcifications in the abdominal aorta with infrarenal abdominal aortic aneurysm. The maximal transverse diameter of 3.0 cm. Recommend CT angio of the abdomen and pelvis in 3 years. 3. 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 Electronically signed by: Lonni Necessary MD 04/25/2024 05:16 PM EDT RP Workstation: [id number]   CT T-SPINE NO CHARGE Result Date: 04/25/2024 EXAM: CT THORACIC SPINE WITHOUT CONTRAST 04/25/2024 04:57:00 PM TECHNIQUE: CT of the thoracic spine was performed without the administration of intravenous contrast. Multiplanar reformatted images are provided for review. Automated exposure control, iterative reconstruction, and/or weight based adjustment of the mA/kV was utilized to reduce the radiation dose to as low as reasonably achievable. COMPARISON: CT chest 12/26/2021. CLINICAL HISTORY: Patient with history of stroke and minor left-sided weakness, presenting after a fall from a wheelchair with subsequent head trauma and chin abrasion. The patient  was down for approximately 4  hours before seeking medical attention. No syncope reported. FINDINGS: BONES AND ALIGNMENT: Acute superior endplate compression fracture of T8 with 5 mm loss of height compared to prior study, approximately 30%. No retropulsed bone is present. No other acute fractures are present. Normal alignment. DEGENERATIVE CHANGES: No significant degenerative changes. SOFT TISSUES: No acute abnormality. VASCULATURE: Atherosclerotic changes are present throughout the thoracic descending thoracic aorta and visualized abdominal aorta without focal aneurysm. Please see CT chest for further description of the ascending aorta. Coronary artery calcifications are present. IMPRESSION: 1. Acute superior endplate compression fracture of T8 with 5 mm loss of height compared to prior study, approximately 30%. No retropulsed bone is present. No other acute fractures are present. Electronically signed by: Lonni Necessary MD 04/25/2024 05:12 PM EDT RP Workstation: [id number]   DG Pelvis Portable Result Date: 04/25/2024 CLINICAL DATA:  Trauma fall EXAM: PORTABLE PELVIS 1-2 VIEWS COMPARISON:  06/23/2022 FINDINGS: Extensive vascular calcification. SI joints are non widened. Pubic symphysis and rami appear intact. Slightly limited assessment of right femoral neck due to positioning. No definitive fracture or malalignment IMPRESSION: No definite acute osseous abnormality. Electronically Signed   By: Luke Bun M.D.   On: 04/25/2024 16:29   DG Chest Port 1 View Result Date: 04/25/2024 CLINICAL DATA:  Trauma fall EXAM: PORTABLE CHEST 1 VIEW COMPARISON:  06/23/2022, chest CT 12/26/2021 FINDINGS: Post sternotomy changes. Cardiomegaly with vascular congestion. Enlarged mediastinal silhouette compared to most recent prior radiograph but probably similar compared with scout CT image from May 2023. Aortic atherosclerosis. No pneumothorax. IMPRESSION: Cardiomegaly with vascular congestion. Enlarged mediastinal  silhouette compared to most recent prior radiograph, probably partially attributed to portable technique, however consider chest CT correlation to exclude acute mediastinal pathology as patient has history of known ascending aortic aneurysm on the prior CT Electronically Signed   By: Luke Bun M.D.   On: 04/25/2024 16:28     Assessment and Plan: Elevated troponin- demand ischemia but significantly elevated from 235--> 1833 Fall (Mechanical) with rhabdo (CK total 915) Near syncopal episode and palpitations Compression # (T8) H/o CVA 2019 with left sided weakness and wheel chair bound CAD s/p CABGx 18 May 2007 .  Abnormal NM stress test 2021 showing infarct, medically treated given frailty Paroxysmal afib on Eliquis  Lactic acidosis 5.4-->3.2 Apical variant Hypertrophic cardiomyopathy Former tobacco use and multiple other comorbidities  Plan: Admit under hospitalist team, cardiology is consulted for elevated troponin Continue to trend troponin to peak, obtain echocardiogram in a.m., already started on heparin  drip by ER, continue that.  I still suspect however significant elevated troponin is due to demand ischemia in the setting of fall, A-fib, underlying CAD Continue other home medication atorvastatin , Coreg , hold Eliquis  while on heparin , aspirin . --> Patient with significant lactic acidosis, possible underlying sepsis might have led to significant demand ischemia, unclear about dizziness and fall, cardiac arrhythmia is possibility, continue telemetry     Risk Assessment/Risk Scores:         CHA2DS2-VASc Score =   5  This indicates a  % annual risk of stroke. The patient's score is based upon:         For questions or updates, please contact  [facility] Please consult [email or web address] for contact info under     Signed, Grayce Bold, MD  04/26/2024 5:46 AM

## 2024-04-26 NOTE — Progress Notes (Signed)
   04/26/24 1224  Assess: MEWS Score  Temp 97.8 F (36.6 C)  BP (!) 110/99  MAP (mmHg) 105  Pulse Rate (!) 117  ECG Heart Rate (!) 118  Resp 16  Level of Consciousness Alert  SpO2 93 %  Assess: MEWS Score  MEWS Temp 0  MEWS Systolic 0  MEWS Pulse 2  MEWS RR 0  MEWS LOC 0  MEWS Score 2  MEWS Score Color Yellow  Assess: if the MEWS score is Yellow or Red  Were vital signs accurate and taken at a resting state? Yes  Does the patient meet 2 or more of the SIRS criteria? No  MEWS guidelines implemented  Yes, yellow  Treat  MEWS Interventions Considered administering scheduled or prn medications/treatments as ordered  Take Vital Signs  Increase Vital Sign Frequency  Yellow: Q2hr x1, continue Q4hrs until patient remains green for 12hrs  Escalate  MEWS: Escalate Yellow: Discuss with charge nurse and consider notifying provider and/or RRT  Notify: Charge Nurse/RN  Name of Charge Nurse/RN Notified Erin RN  Provider Notification  Provider Name/Title Darci grand MD  Date Provider Notified 04/26/24  Time Provider Notified 1300  Notification Reason Other (Comment) (Yellow MEWS)  Provider response See new orders  Date of Provider Response 04/26/24  Time of Provider Response 1310  Assess: SIRS CRITERIA  SIRS Temperature  0  SIRS Respirations  0  SIRS Pulse 1  SIRS WBC 1  SIRS Score Sum  2

## 2024-04-26 NOTE — Progress Notes (Signed)
 Phlebotomy technician in to collect labs from patient. Technician was able to draw APTT lab and Troponin, but unable to get enough blood for repeat lactic acid levels. Patient yelled at technician and told her that she could not stick her again and don't bother to come back and draw any more blood. Spoke with patient and informed her of the importance of her labs being checked, however patient remained adamant about not getting stuck again. Darci Pore, MD messaged.

## 2024-04-26 NOTE — Progress Notes (Addendum)
 Pharmacy Antibiotic Note  Kimberly [name] is a 68 y.o. female admitted on 04/25/2024 with near syncope and fall.  Pharmacy has been consulted for cefepime /vancomycin  dosing for sepsis concerns.  -WBC 17, afebrile, sCr 1.2 (bl~1) -Single set blood cultures collected  Plan: -Cefepime  2g IV every 12 hours -Azithromycin  500mg  IV every 24 hours -Vancomycin  1g IV x1 -Vancomycin  750mg  IV every 24 hours (AUC 481, Vd 0.72, TBW, sCr 1.2) -Monitor renal function -Follow up signs of clinical improvement, LOT, de-escalation of antibiotics   Height: 5' 7 (170.2 cm) Weight: 56.7 kg (125 lb) IBW/kg (Calculated) : 61.6  Temp (24hrs), Avg:98 F (36.7 C), Min:97.8 F (36.6 C), Max:98.2 F (36.8 C)  Recent Labs  Lab 04/25/24 1613 04/25/24 1623 04/25/24 2309 04/26/24 0155 04/26/24 0156  WBC 18.4*  --   --  17.4*  --   CREATININE 1.35* 1.20*  --   --   --   LATICACIDVEN  --  5.4* 3.3*  --  3.2*    Estimated Creatinine Clearance: 40.7 mL/min (A) (by C-G formula based on SCr of 1.2 mg/dL (H)).    Allergies  Allergen Reactions   Clonidine  Derivatives Other (See Comments)    Patch causes scars   Spironolactone  Rash    Antimicrobials this admission: Cefepime  9/12 >>  Vancomycin  9/12 >>   Microbiology results: 9/12 BCx:   Thank you for allowing pharmacy to be a part of this patient's care.  Lynwood Poplar, PharmD, BCPS Clinical Pharmacist 04/26/2024 6:12 AM

## 2024-04-26 NOTE — Plan of Care (Signed)
  Problem: Education: Goal: Knowledge of General Education information will improve Description: Including pain rating scale, medication(s)/side effects and non-pharmacologic comfort measures Outcome: Progressing   Problem: Nutrition: Goal: Adequate nutrition will be maintained Outcome: Progressing   Problem: Coping: Goal: Level of anxiety will decrease Outcome: Progressing   Problem: Pain Managment: Goal: General experience of comfort will improve and/or be controlled Outcome: Progressing   Problem: Safety: Goal: Ability to remain free from injury will improve Outcome: Progressing   Problem: Skin Integrity: Goal: Risk for impaired skin integrity will decrease Outcome: Progressing

## 2024-04-26 NOTE — Progress Notes (Addendum)
 Progress Note  Patient Name: Kimberly [name] Date of Encounter: 04/26/2024  [facility] Cardiologist: Lynwood Schilling, MD   Patient Profile     Subjective   Denies any chest pain or SOB.  Complains of dizziness  Inpatient Medications    Scheduled Meds:  [START ON 04/27/2024] aspirin  EC  81 mg Oral Daily   carvedilol   12.5 mg Oral BID WC   metoprolol  tartrate  5 mg Intravenous Once   umeclidinium-vilanterol  1 puff Inhalation Daily   Continuous Infusions:  sodium chloride  125 mL/hr at 04/26/24 0208   azithromycin  Stopped (04/26/24 0801)   ceFEPime  (MAXIPIME ) IV Stopped (04/26/24 1244)   heparin  850 Units/hr (04/26/24 0635)   vancomycin      PRN Meds: acetaminophen  **OR** acetaminophen , albuterol , ondansetron  **OR** ondansetron  (ZOFRAN ) IV, oxyCODONE    Vital Signs    Vitals:   04/26/24 0909 04/26/24 0910 04/26/24 1200 04/26/24 1224  BP:   126/87 (!) 110/99  Pulse: (!) 127 (!) 122 (!) 126 (!) 117  Resp: 15 15 (!) 21 16  Temp:    97.8 F (36.6 C)  TempSrc:    Oral  SpO2: 96% 94% 95% 93%  Weight:      Height:        Intake/Output Summary (Last 24 hours) at 04/26/2024 1335 Last data filed at 04/26/2024 0801 Gross per 24 hour  Intake 2334.15 ml  Output 0 ml  Net 2334.15 ml      04/25/2024    4:25 PM 04/02/2024    2:59 PM 10/09/2023    3:39 PM  Last 3 Weights  Weight (lbs) 125 lb 130 lb 139 lb 3.2 oz  Weight (kg) 56.7 kg 58.968 kg 63.141 kg      Telemetry    Normal sinus rhythm- Personally Reviewed  ECG    No new EKG to review- Personally Reviewed  Physical Exam   GEN: No acute distress.   Neck: No JVD Cardiac: RRR, no murmurs, rubs, or gallops.  Respiratory: Clear to auscultation bilaterally. GI: Soft, nontender, non-distended  MS: No edema; No deformity. Neuro:  Nonfocal  Psych: Normal affect   Labs    High Sensitivity Troponin:   Recent Labs  Lab 04/25/24 1613 04/25/24 1832 04/25/24 2309 04/26/24 0155 04/26/24 0850   TROPONINIHS 235* 526* 1,443* 1,833* 1,809*      Chemistry Recent Labs  Lab 04/25/24 1613 04/25/24 1623 04/26/24 0337  NA 143 145 141  K 3.7 3.6 4.1  CL 109 113* 113*  CO2 16*  --  16*  GLUCOSE 168* 166* 163*  BUN 28* 28* 27*  CREATININE 1.35* 1.20* 1.17*  CALCIUM  9.4  --  8.2*  PROT 6.6  --  6.0*  ALBUMIN 4.0  --  3.4*  AST 72*  --  88*  ALT 48*  --  46*  ALKPHOS 69  --  60  BILITOT 1.4*  --  0.6  GFRNONAA 43*  --  51*  ANIONGAP 18*  --  12     Hematology Recent Labs  Lab 04/25/24 1613 04/25/24 1623 04/26/24 0155  WBC 18.4*  --  17.4*  RBC 4.44  --  4.24  HGB 9.9* 11.2* 9.5*  HCT 34.0* 33.0* 30.7*  MCV 76.6*  --  72.4*  MCH 22.3*  --  22.4*  MCHC 29.1*  --  30.9  RDW 16.6*  --  16.5*  PLT 181  --  182    BNPNo results for input(s): BNP, PROBNP in the last 168  hours.   DDimer No results for input(s): DDIMER in the last 168 hours.   CHA2DS2-VASc Score = 7   This indicates a 11.2% annual risk of stroke. The patient's score is based upon: CHF History: 1 HTN History: 1 Diabetes History: 0 Stroke History: 2 Vascular Disease History: 1 Age Score: 1 Gender Score: 1      Radiology    CT CHEST ABDOMEN PELVIS W CONTRAST Result Date: 04/25/2024 CLINICAL DATA:  Blunt trauma EXAM: CT CHEST, ABDOMEN, AND PELVIS WITH CONTRAST TECHNIQUE: Multidetector CT imaging of the chest, abdomen and pelvis was performed following the standard protocol during bolus administration of intravenous contrast. RADIATION DOSE REDUCTION: This exam was performed according to the departmental dose-optimization program which includes automated exposure control, adjustment of the mA and/or kV according to patient size and/or use of iterative reconstruction technique. CONTRAST:  55mL OMNIPAQUE  IOHEXOL  350 MG/ML SOLN COMPARISON:  Chest radiograph same day, chest CT Dec 26, 2021 FINDINGS: CT CHEST FINDINGS Cardiovascular: The heart size is enlarged. Aneurysmal dilation of ascending aorta  measuring 4.3 cm. Enlarged pulmonary artery measuring 3.6 cm. Status post CABG. No pericardial effusion. Atherosclerotic calcifications of coronary arteries. Mediastinum/Nodes: No suspicious lymphadenopathy. Few subcentimeter bilateral lower paratracheal lymph nodes likely reactive. Lungs/Pleura: Upper lobe predominant centrilobular emphysematous changes. Multi foci scarring/subsegmental atelectasis predominantly involving the right middle lobe and posteromedial left lower lobe likely sequela from chronic infection/inflammation, increased to prior. Progressive fibrotic changes and interlobular septal thickening in peripheral right upper and lower lobes. No posttraumatic changes of the lungs. There is posterior indentation/septation versus a diverticulum along the left lateral wall of the trachea in thoracic inlet. No pleural effusion. No pneumothorax. Musculoskeletal: No suspicious fracture or lytic sclerotic osseous lesion. Median sternotomy. CT ABDOMEN PELVIS FINDINGS Hepatobiliary: No suspicious liver lesion. No intrahepatic biliary dilatation. Gallbladder is unremarkable. Common bile duct is dilated up to 7 mm. No definite choledocholithiasis identified. Pancreas: Pancreas is normal.  No pancreatic ductal dilatation. Spleen: Normal size spleen. Adrenals/Urinary Tract: Both adrenal glands are normal. Left kidney subcentimeter cortical cysts which does not require imaging follow-up no hydronephrosis.Bladder is unremarkable. Stomach/Bowel: Stomach and bowel loops appear normal. No bowel dilatation, obstruction or wall thickening. Large stool is identified throughout the colon. Normal appendix. Vascular/Lymphatic: Atherosclerotic calcifications of aorta. Aneurysmal dilation of infrarenal aorta measuring 3.2 x 3 x 2.8 cm which does not require imaging follow-up. No suspicious lymphadenopathy. Reproductive: Calcified uterine fibroids.  No adnexal mass. Other: Tiny fat containing umbilical hernia a small fat containing  ventral wall hernia with a defect measuring 4 mm about 8 cm proximal to umbilicus. Musculoskeletal: Degenerative changes of the spine with compression fracture of T8 vertebral body (40% height reduction), new to 2023. IMPRESSION: Compression fracture of T8 vertebral body of indeterminate age. Please refer to same day dedicated CT. Aneurysmal dilation of ascending aorta and infrarenal abdominal aorta. Enlarged pulmonary artery which can be seen the setting of pulmonary artery hypertension. Stigmata of interstitial and chronic lung changes likely sequela from prior infection/inflammation, increased to prior exam from 2023. No suspicious pulmonary nodule. Additional findings as above. Electronically Signed   By: Megan  Zare M.D.   On: 04/25/2024 17:32   CT CERVICAL SPINE WO CONTRAST Result Date: 04/25/2024 EXAM: CT CERVICAL SPINE WITHOUT CONTRAST 04/25/2024 04:57:00 PM TECHNIQUE: CT of the cervical spine was performed without the administration of intravenous contrast. Multiplanar reformatted images are provided for review. Automated exposure control, iterative reconstruction, and/or weight based adjustment of the mA/kV was utilized to reduce the radiation  dose to as low as reasonably achievable. COMPARISON: None available. CLINICAL HISTORY: Polytrauma, blunt. Fall; CT CHEST ABDOMEN PELVIS W CONTRAST; Polytrauma, blunt; CT HEAD WO CONTRAST; Head trauma, moderate-severe; CT CERVICAL SPINE WO CONTRAST; Polytrauma, blunt; CT L-SPINE NO CHARGE; CT T-SPINE NO CHARGE. Omni 350 55mL; See ED Notes:; Pt coming in after a fall pt takes elequis. Pt went to stand from wheelchair. Pt got light headed and dizzy and fell forward no syncope. ; Pt was down about 4 hours. Pt has abrasion to her chin. Pt has hx of stroke minor left sided weakness. Pt reports being sick FINDINGS: CERVICAL SPINE: BONES AND ALIGNMENT: No acute fracture or traumatic malalignment. Straightening of the normal cervical lordosis is stable. The patient is in a  hard collar. DEGENERATIVE CHANGES: Mild endplate degenerative changes are again noted at C4-5, C5-6 and C6-7. SOFT TISSUES: No prevertebral soft tissue swelling. IMPRESSION: 1. No acute abnormality of the cervical spine related to the reported polytrauma and fall. 2. Mild endplate degenerative changes at C4-5, C5-6, and C6-7. Electronically signed by: Lonni Necessary MD 04/25/2024 05:28 PM EDT RP Workstation: [id number]   CT HEAD WO CONTRAST Result Date: 04/25/2024 EXAM: CT HEAD WITHOUT CONTRAST 04/25/2024 04:57:00 PM TECHNIQUE: CT of the head was performed without the administration of intravenous contrast. Automated exposure control, iterative reconstruction, and/or weight based adjustment of the mA/kV was utilized to reduce the radiation dose to as low as reasonably achievable. COMPARISON: None available. CLINICAL HISTORY: Head trauma, moderate-severe. Fall; CT CHEST ABDOMEN PELVIS W CONTRAST; Polytrauma, blunt; CT HEAD WO CONTRAST; Head trauma, moderate-severe; CT CERVICAL SPINE WO CONTRAST; Polytrauma, blunt; CT L-SPINE NO CHARGE; CT T-SPINE NO CHARGE. Omni 350 55mL; See ED Notes:; Pt coming in after a fall pt takes elequis. Pt went to stand from wheelchair. Pt got light headed and dizzy and fell forward no syncope. ; Pt was down about 4 hours. Pt has abrasion to her chin. Pt has hx of stroke minor left sided weakness. Pt reports being sick FINDINGS: BRAIN AND VENTRICLES: Multiple remote cortical infarcts are again noted. Infarcts involve the posterior medial parietal lobes bilaterally, the inferior right temporal lobe and right occipital lobe in the anterior left middle frontal gyrus. Remote lacunar infarcts are present in the right caudate head, left lentiform nucleus and right thalamus. No acute hemorrhage. No evidence of acute infarct. No hydrocephalus. No extra-axial collection. No mass effect or midline shift. ORBITS: No acute abnormality. Soft tissue swelling extends to the periorbital regions  bilaterally. SINUSES: Minimal fluid is present in the maxillary sinuses bilaterally without associated fracture. SOFT TISSUES AND SKULL: Frontal scalp soft tissue swelling is present without underlying fracture or foreign body. No skull fracture. IMPRESSION: 1. No acute intracranial abnormality related to the head trauma. 2. Frontal scalp and bilateral periorbital soft tissue swelling without underlying fracture or foreign body. 3. Multiple remote cortical and lacunar infarcts as described above. Electronically signed by: Lonni Necessary MD 04/25/2024 05:26 PM EDT RP Workstation: [id number]   CT L-SPINE NO CHARGE Result Date: 04/25/2024 EXAM: CT OF THE LUMBAR SPINE WITHOUT CONTRAST 04/25/2024 04:57:00 PM TECHNIQUE: CT of the lumbar spine was performed without the administration of intravenous contrast. Multiplanar reformatted images are provided for review. Automated exposure control, iterative reconstruction, and/or weight based adjustment of the mA/kV was utilized to reduce the radiation dose to as low as reasonably achievable. COMPARISON: 02/13/2018. Images were not currently available. CLINICAL HISTORY: Fall; Polytrauma, blunt; Head trauma, moderate-severe; Pt coming in after a fall pt takes elequis. Pt  went to stand from wheelchair. Pt got light headed and dizzy and fell forward no syncope. Pt was down about 4 hours. Pt has abrasion to her chin. Pt has hx of stroke minor left sided weakness. Pt reports being sick. FINDINGS: BONES AND ALIGNMENT: Normal vertebral body heights. No acute fracture or suspicious bone lesion. Normal alignment. DEGENERATIVE CHANGES: No significant degenerative changes. SOFT TISSUES: No acute abnormality. VASCULATURE: Atherosclerotic calcifications present in the abdominal aorta. Maximal transverse diameter is 3.0 cm. 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 IMPRESSION: 1. No evidence of acute traumatic injury. 2. Atherosclerotic calcifications in the  abdominal aorta with infrarenal abdominal aortic aneurysm. The maximal transverse diameter of 3.0 cm. Recommend CT angio of the abdomen and pelvis in 3 years. 3. 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 Electronically signed by: Lonni Necessary MD 04/25/2024 05:16 PM EDT RP Workstation: [id number]   CT T-SPINE NO CHARGE Result Date: 04/25/2024 EXAM: CT THORACIC SPINE WITHOUT CONTRAST 04/25/2024 04:57:00 PM TECHNIQUE: CT of the thoracic spine was performed without the administration of intravenous contrast. Multiplanar reformatted images are provided for review. Automated exposure control, iterative reconstruction, and/or weight based adjustment of the mA/kV was utilized to reduce the radiation dose to as low as reasonably achievable. COMPARISON: CT chest 12/26/2021. CLINICAL HISTORY: Patient with history of stroke and minor left-sided weakness, presenting after a fall from a wheelchair with subsequent head trauma and chin abrasion. The patient was down for approximately 4 hours before seeking medical attention. No syncope reported. FINDINGS: BONES AND ALIGNMENT: Acute superior endplate compression fracture of T8 with 5 mm loss of height compared to prior study, approximately 30%. No retropulsed bone is present. No other acute fractures are present. Normal alignment. DEGENERATIVE CHANGES: No significant degenerative changes. SOFT TISSUES: No acute abnormality. VASCULATURE: Atherosclerotic changes are present throughout the thoracic descending thoracic aorta and visualized abdominal aorta without focal aneurysm. Please see CT chest for further description of the ascending aorta. Coronary artery calcifications are present. IMPRESSION: 1. Acute superior endplate compression fracture of T8 with 5 mm loss of height compared to prior study, approximately 30%. No retropulsed bone is present. No other acute fractures are present. Electronically signed by: Lonni Necessary MD 04/25/2024 05:12 PM EDT RP  Workstation: [id number]   DG Pelvis Portable Result Date: 04/25/2024 CLINICAL DATA:  Trauma fall EXAM: PORTABLE PELVIS 1-2 VIEWS COMPARISON:  06/23/2022 FINDINGS: Extensive vascular calcification. SI joints are non widened. Pubic symphysis and rami appear intact. Slightly limited assessment of right femoral neck due to positioning. No definitive fracture or malalignment IMPRESSION: No definite acute osseous abnormality. Electronically Signed   By: Luke Bun M.D.   On: 04/25/2024 16:29   DG Chest Port 1 View Result Date: 04/25/2024 CLINICAL DATA:  Trauma fall EXAM: PORTABLE CHEST 1 VIEW COMPARISON:  06/23/2022, chest CT 12/26/2021 FINDINGS: Post sternotomy changes. Cardiomegaly with vascular congestion. Enlarged mediastinal silhouette compared to most recent prior radiograph but probably similar compared with scout CT image from May 2023. Aortic atherosclerosis. No pneumothorax. IMPRESSION: Cardiomegaly with vascular congestion. Enlarged mediastinal silhouette compared to most recent prior radiograph, probably partially attributed to portable technique, however consider chest CT correlation to exclude acute mediastinal pathology as patient has history of known ascending aortic aneurysm on the prior CT Electronically Signed   By: Luke Bun M.D.   On: 04/25/2024 16:28    Patient Profile     68 y.o. female with a hx of CAD s/p CABG x 18 May 2007 , apical  variant hypertrophic cardiomyopathy, paroxysmal A-fib, history of CVA in 2019 status post left-sided weakness, occluded bilateral internal carotid artery, multiple other comorbidities such as hypertension, hyperlipidemia, former tobacco use who is being seen 04/26/2024 for the evaluation of elevated troponin at the request of Dr Debby.   Assessment & Plan   #Elevated troponin #CAD -History of CAD status post remote CABG in 2008 - Admitted with near syncope and mechanical fall and noted to have elevated hs troponin initially to 35>> 526>>  1443>> 1833>> 1809 - Suspect demand ischemia in the setting of severe lactic acidosis and known CAD/HOCM with rhabdomyolysis (CPK total 915 with MB 26.9 and an relative index 2.9) - Denies any chest pain or shortness of breath - Await results of 2D echo - Continue aspirin  81 mg daily, carvedilol  12.5 mg twice daily, IV heparin  drip - Further ischemic workup pending results of 2D echo (2D echo 2023 with EF 60 to 65% with apical HOCM/severe RV dysfunction)  #Mechanical fall #Presyncope #Rhabdomyolysis - Patient has residual left-sided weakness with chronic wheelchair-bound state from CVA and also a history of postural dizziness with near syncope - Presented to the ER with an episode of syncope followed by near syncope and a fall.  She did not have LOC.  Fall was not witnessed and she was on the floor for 4 hours. - Currently on IV fluids - check orthostatic BPs  #Sepsis #Lactic acidosis  #Leukocytosis - Possible source pulmonary with procalcitonin 8 lactic acid 5.4 - Acute superior endplate compression fracture of T8 - On broad-spectrum antibiotics per [facility]  AKI - Serum creatinine 1.35 on admission - Avoiding nephrotoxic drugs  I spent 30 minutes caring for this patient today face to face, ordering and reviewing labs, reviewing records from Chest CT 9/212/25 , seeing the patient, documenting in the record, and arranging for a 2D echo (studies)   For questions or updates, please contact [facility] Please consult [email or web address] for contact info under        Signed, Wilbert Bihari, MD  04/26/2024, 1:35 PM

## 2024-04-26 NOTE — ED Notes (Signed)
 Followed up with Main Lab for troponin level. Lab to result

## 2024-04-26 NOTE — Plan of Care (Signed)

## 2024-04-26 NOTE — ED Notes (Signed)
 Attempted 3rd IV with lab draw. Unsuccessful IV. Blood obtained.

## 2024-04-26 NOTE — ED Notes (Signed)
 Pt refused labs.

## 2024-04-26 NOTE — Progress Notes (Addendum)
 Progress Note   Patient: Kimberly [name] FMW:[id number] DOB: 08/17/1955 DOA: 04/25/2024     1 DOS: the patient was seen and examined on 04/26/2024   Brief hospital course: Kimberly [name] is a 68 y.o. 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   Patient is found to have elevated white count, tachycardia, lactic acidosis, elevated troponin. Admitted to [facility] service with cardiology evaluation.  Assessment and Plan: Sepsis unknown etiology- Patient presented with tachycardia, tachypnea, lactic acidosis, elevated white count. COVID, flu negative, HIV nonreactive, blood cultures pending. CT chest abdomen pelvis showed compression fracture T8, findings of pulmonary hypertension, interstitial chronic lung changes increased from prior exam. Check C. difficile studies as she has loose stools. Respiratory viral panel ordered. Continue to trend WBC, lactic acid. Gentle IV fluids per sepsis protocol. Patient is currently on cefepime  and azithromycin  therapy.  Elevated troponin- Possibly demand ischemia from sepsis. He does have CK level elevated. Continue IV heparin  drip as per floor protocol. Continue telemetry monitoring, obtain echocardiogram.  Near syncope Status post mechanical fall T8 compression fracture Rhabdomyolysis- Patient does have a history of dizziness. Continue telemetry rule out cardiac arrhythmias. Echocardiogram pending. Gentle IV hydration.  Trend CK. PT/ OT evaluation, out of bed to chair.  Acute kidney injury- Possibly prerenal. Continue gentle IV fluids. Monitor daily renal function. Avoid nephrotoxic drugs.  Elevated LFTs- In the  setting of sepsis. Continue to trend LFT. CT imaging unremarkable.  Afib with RVR- Continue Cardizem . Caution with low BP. Hold Eliquis  as she is on heparin  drip. IV lopressor  one time dose ordered.   States that she lives alone.  High risk for falls.  PT OT evaluation Out of bed to chair. Incentive spirometry. Nursing supportive care. Fall, aspiration precautions. Diet:  Diet Orders (From admission, onward)     Start     Ordered   04/25/24 1959  Diet Heart Room service appropriate? Yes; Fluid consistency: Thin  Diet effective now       Question Answer Comment  Room service appropriate? Yes   Fluid consistency: Thin      04/25/24 1959           DVT prophylaxis:   Level of care: Progressive   Code Status: Full Code  Subjective: Patient is seen and examined today morning in the emergency department.  She is lying comfortably.  Feels cold, denies chest discomfort or shortness of breath.  Eating fair.  Did not get out of bed.  Physical Exam: Vitals:   04/26/24 0909 04/26/24 0910 04/26/24 1200 04/26/24 1224  BP:   126/87 (!) 110/99  Pulse: (!) 127 (!) 122 (!) 126 (!) 117  Resp: 15 15 (!) 21 16  Temp:    97.8 F (36.6 C)  TempSrc:    Oral  SpO2: 96% 94% 95% 93%  Weight:      Height:        General - Elderly African-American female, no apparent distress HEENT - PERRLA, EOMI, atraumatic head, non tender sinuses. Lung - Clear, basal rales, rhonchi, no wheezes. Heart - S1, S2 heard, no murmurs, rubs, trace pedal edema. Abdomen - Soft, non tender, bowel sounds heard Neuro - Alert, awake and oriented x 3, non focal exam. Skin - Warm and dry.  Data Reviewed:      Latest Ref Rng & Units 04/26/2024    1:55 AM 04/25/2024    4:23 PM 04/25/2024    4:13 PM  CBC  WBC 4.0 - 10.5 K/uL 17.4   18.4   Hemoglobin 12.0 - 15.0 g/dL 9.5  88.7  9.9   Hematocrit 36.0 - 46.0 % 30.7  33.0  34.0   Platelets 150 - 400 K/uL 182   181       Latest Ref Rng & Units 04/26/2024    3:37  AM 04/25/2024    4:23 PM 04/25/2024    4:13 PM  BMP  Glucose 70 - 99 mg/dL 836  833  831   BUN 8 - 23 mg/dL 27  28  28    Creatinine 0.44 - 1.00 mg/dL 8.82  8.79  8.64   Sodium 135 - 145 mmol/L 141  145  143   Potassium 3.5 - 5.1 mmol/L 4.1  3.6  3.7   Chloride 98 - 111 mmol/L 113  113  109   CO2 22 - 32 mmol/L 16   16   Calcium  8.9 - 10.3 mg/dL 8.2   9.4    CT CHEST ABDOMEN PELVIS W CONTRAST Result Date: 04/25/2024 CLINICAL DATA:  Blunt trauma EXAM: CT CHEST, ABDOMEN, AND PELVIS WITH CONTRAST TECHNIQUE: Multidetector CT imaging of the chest, abdomen and pelvis was performed following the standard protocol during bolus administration of intravenous contrast. RADIATION DOSE REDUCTION: This exam was performed according to the departmental dose-optimization program which includes automated exposure control, adjustment of the mA and/or kV according to patient size and/or use of iterative reconstruction technique. CONTRAST:  55mL OMNIPAQUE  IOHEXOL  350 MG/ML SOLN COMPARISON:  Chest radiograph same day, chest CT Dec 26, 2021 FINDINGS: CT CHEST FINDINGS Cardiovascular: The heart size is enlarged. Aneurysmal dilation of ascending aorta measuring 4.3 cm. Enlarged pulmonary artery measuring 3.6 cm. Status post CABG. No pericardial effusion. Atherosclerotic calcifications of coronary arteries. Mediastinum/Nodes: No suspicious lymphadenopathy. Few subcentimeter bilateral lower paratracheal lymph nodes likely reactive. Lungs/Pleura: Upper lobe predominant centrilobular emphysematous changes. Multi foci scarring/subsegmental atelectasis predominantly involving the right middle lobe and posteromedial left lower lobe likely sequela from chronic infection/inflammation, increased to prior. Progressive fibrotic changes and interlobular septal thickening in peripheral right upper and lower lobes. No posttraumatic changes of the lungs. There is posterior indentation/septation versus a diverticulum along the left lateral wall  of the trachea in thoracic inlet. No pleural effusion. No pneumothorax. Musculoskeletal: No suspicious fracture or lytic sclerotic osseous lesion. Median sternotomy. CT ABDOMEN PELVIS FINDINGS Hepatobiliary: No suspicious liver lesion. No intrahepatic biliary dilatation. Gallbladder is unremarkable. Common bile duct is dilated up to 7 mm. No definite choledocholithiasis identified. Pancreas: Pancreas is normal.  No pancreatic ductal dilatation. Spleen: Normal size spleen. Adrenals/Urinary Tract: Both adrenal glands are normal. Left kidney subcentimeter cortical cysts which does not require imaging follow-up no hydronephrosis.Bladder is unremarkable. Stomach/Bowel: Stomach and bowel loops appear normal. No bowel dilatation, obstruction or wall thickening. Large stool is identified throughout the colon. Normal appendix. Vascular/Lymphatic: Atherosclerotic calcifications of aorta. Aneurysmal dilation of infrarenal aorta measuring 3.2 x 3 x 2.8 cm which does not require imaging follow-up. No suspicious lymphadenopathy. Reproductive: Calcified uterine fibroids.  No adnexal mass. Other: Tiny fat containing umbilical hernia a small fat containing ventral wall hernia with a defect measuring 4 mm about 8 cm proximal to umbilicus. Musculoskeletal: Degenerative changes of the spine with compression fracture of T8 vertebral body (40% height reduction), new to 2023.  IMPRESSION: Compression fracture of T8 vertebral body of indeterminate age. Please refer to same day dedicated CT. Aneurysmal dilation of ascending aorta and infrarenal abdominal aorta. Enlarged pulmonary artery which can be seen the setting of pulmonary artery hypertension. Stigmata of interstitial and chronic lung changes likely sequela from prior infection/inflammation, increased to prior exam from 2023. No suspicious pulmonary nodule. Additional findings as above. Electronically Signed   By: Megan  Zare M.D.   On: 04/25/2024 17:32   CT CERVICAL SPINE WO  CONTRAST Result Date: 04/25/2024 EXAM: CT CERVICAL SPINE WITHOUT CONTRAST 04/25/2024 04:57:00 PM TECHNIQUE: CT of the cervical spine was performed without the administration of intravenous contrast. Multiplanar reformatted images are provided for review. Automated exposure control, iterative reconstruction, and/or weight based adjustment of the mA/kV was utilized to reduce the radiation dose to as low as reasonably achievable. COMPARISON: None available. CLINICAL HISTORY: Polytrauma, blunt. Fall; CT CHEST ABDOMEN PELVIS W CONTRAST; Polytrauma, blunt; CT HEAD WO CONTRAST; Head trauma, moderate-severe; CT CERVICAL SPINE WO CONTRAST; Polytrauma, blunt; CT L-SPINE NO CHARGE; CT T-SPINE NO CHARGE. Omni 350 55mL; See ED Notes:; Pt coming in after a fall pt takes elequis. Pt went to stand from wheelchair. Pt got light headed and dizzy and fell forward no syncope. ; Pt was down about 4 hours. Pt has abrasion to her chin. Pt has hx of stroke minor left sided weakness. Pt reports being sick FINDINGS: CERVICAL SPINE: BONES AND ALIGNMENT: No acute fracture or traumatic malalignment. Straightening of the normal cervical lordosis is stable. The patient is in a hard collar. DEGENERATIVE CHANGES: Mild endplate degenerative changes are again noted at C4-5, C5-6 and C6-7. SOFT TISSUES: No prevertebral soft tissue swelling. IMPRESSION: 1. No acute abnormality of the cervical spine related to the reported polytrauma and fall. 2. Mild endplate degenerative changes at C4-5, C5-6, and C6-7. Electronically signed by: Lonni Necessary MD 04/25/2024 05:28 PM EDT RP Workstation: [id number]   CT HEAD WO CONTRAST Result Date: 04/25/2024 EXAM: CT HEAD WITHOUT CONTRAST 04/25/2024 04:57:00 PM TECHNIQUE: CT of the head was performed without the administration of intravenous contrast. Automated exposure control, iterative reconstruction, and/or weight based adjustment of the mA/kV was utilized to reduce the radiation dose to as low as  reasonably achievable. COMPARISON: None available. CLINICAL HISTORY: Head trauma, moderate-severe. Fall; CT CHEST ABDOMEN PELVIS W CONTRAST; Polytrauma, blunt; CT HEAD WO CONTRAST; Head trauma, moderate-severe; CT CERVICAL SPINE WO CONTRAST; Polytrauma, blunt; CT L-SPINE NO CHARGE; CT T-SPINE NO CHARGE. Omni 350 55mL; See ED Notes:; Pt coming in after a fall pt takes elequis. Pt went to stand from wheelchair. Pt got light headed and dizzy and fell forward no syncope. ; Pt was down about 4 hours. Pt has abrasion to her chin. Pt has hx of stroke minor left sided weakness. Pt reports being sick FINDINGS: BRAIN AND VENTRICLES: Multiple remote cortical infarcts are again noted. Infarcts involve the posterior medial parietal lobes bilaterally, the inferior right temporal lobe and right occipital lobe in the anterior left middle frontal gyrus. Remote lacunar infarcts are present in the right caudate head, left lentiform nucleus and right thalamus. No acute hemorrhage. No evidence of acute infarct. No hydrocephalus. No extra-axial collection. No mass effect or midline shift. ORBITS: No acute abnormality. Soft tissue swelling extends to the periorbital regions bilaterally. SINUSES: Minimal fluid is present in the maxillary sinuses bilaterally without associated fracture. SOFT TISSUES AND SKULL: Frontal scalp soft tissue swelling is present without underlying fracture or foreign body. No skull fracture. IMPRESSION: 1. No  acute intracranial abnormality related to the head trauma. 2. Frontal scalp and bilateral periorbital soft tissue swelling without underlying fracture or foreign body. 3. Multiple remote cortical and lacunar infarcts as described above. Electronically signed by: Lonni Necessary MD 04/25/2024 05:26 PM EDT RP Workstation: [id number]   CT L-SPINE NO CHARGE Result Date: 04/25/2024 EXAM: CT OF THE LUMBAR SPINE WITHOUT CONTRAST 04/25/2024 04:57:00 PM TECHNIQUE: CT of the lumbar spine was performed without  the administration of intravenous contrast. Multiplanar reformatted images are provided for review. Automated exposure control, iterative reconstruction, and/or weight based adjustment of the mA/kV was utilized to reduce the radiation dose to as low as reasonably achievable. COMPARISON: 02/13/2018. Images were not currently available. CLINICAL HISTORY: Fall; Polytrauma, blunt; Head trauma, moderate-severe; Pt coming in after a fall pt takes elequis. Pt went to stand from wheelchair. Pt got light headed and dizzy and fell forward no syncope. Pt was down about 4 hours. Pt has abrasion to her chin. Pt has hx of stroke minor left sided weakness. Pt reports being sick. FINDINGS: BONES AND ALIGNMENT: Normal vertebral body heights. No acute fracture or suspicious bone lesion. Normal alignment. DEGENERATIVE CHANGES: No significant degenerative changes. SOFT TISSUES: No acute abnormality. VASCULATURE: Atherosclerotic calcifications present in the abdominal aorta. Maximal transverse diameter is 3.0 cm. 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 IMPRESSION: 1. No evidence of acute traumatic injury. 2. Atherosclerotic calcifications in the abdominal aorta with infrarenal abdominal aortic aneurysm. The maximal transverse diameter of 3.0 cm. Recommend CT angio of the abdomen and pelvis in 3 years. 3. 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 Electronically signed by: Lonni Necessary MD 04/25/2024 05:16 PM EDT RP Workstation: [id number]   CT T-SPINE NO CHARGE Result Date: 04/25/2024 EXAM: CT THORACIC SPINE WITHOUT CONTRAST 04/25/2024 04:57:00 PM TECHNIQUE: CT of the thoracic spine was performed without the administration of intravenous contrast. Multiplanar reformatted images are provided for review. Automated exposure control, iterative reconstruction, and/or weight based adjustment of the mA/kV was utilized to reduce the radiation dose to as low as reasonably achievable. COMPARISON: CT chest  12/26/2021. CLINICAL HISTORY: Patient with history of stroke and minor left-sided weakness, presenting after a fall from a wheelchair with subsequent head trauma and chin abrasion. The patient was down for approximately 4 hours before seeking medical attention. No syncope reported. FINDINGS: BONES AND ALIGNMENT: Acute superior endplate compression fracture of T8 with 5 mm loss of height compared to prior study, approximately 30%. No retropulsed bone is present. No other acute fractures are present. Normal alignment. DEGENERATIVE CHANGES: No significant degenerative changes. SOFT TISSUES: No acute abnormality. VASCULATURE: Atherosclerotic changes are present throughout the thoracic descending thoracic aorta and visualized abdominal aorta without focal aneurysm. Please see CT chest for further description of the ascending aorta. Coronary artery calcifications are present. IMPRESSION: 1. Acute superior endplate compression fracture of T8 with 5 mm loss of height compared to prior study, approximately 30%. No retropulsed bone is present. No other acute fractures are present. Electronically signed by: Lonni Necessary MD 04/25/2024 05:12 PM EDT RP Workstation: [id number]   DG Pelvis Portable Result Date: 04/25/2024 CLINICAL DATA:  Trauma fall EXAM: PORTABLE PELVIS 1-2 VIEWS COMPARISON:  06/23/2022 FINDINGS: Extensive vascular calcification. SI joints are non widened. Pubic symphysis and rami appear intact. Slightly limited assessment of right femoral neck due to positioning. No definitive fracture or malalignment IMPRESSION: No definite acute osseous abnormality. Electronically Signed   By: Luke Bun M.D.   On: 04/25/2024 16:29   DG  Chest Port 1 View Result Date: 04/25/2024 CLINICAL DATA:  Trauma fall EXAM: PORTABLE CHEST 1 VIEW COMPARISON:  06/23/2022, chest CT 12/26/2021 FINDINGS: Post sternotomy changes. Cardiomegaly with vascular congestion. Enlarged mediastinal silhouette compared to most recent prior  radiograph but probably similar compared with scout CT image from May 2023. Aortic atherosclerosis. No pneumothorax. IMPRESSION: Cardiomegaly with vascular congestion. Enlarged mediastinal silhouette compared to most recent prior radiograph, probably partially attributed to portable technique, however consider chest CT correlation to exclude acute mediastinal pathology as patient has history of known ascending aortic aneurysm on the prior CT Electronically Signed   By: Luke Bun M.D.   On: 04/25/2024 16:28    Family Communication: Discussed with patient, understand and agree. All questions answered.  Disposition: Status is: Inpatient Remains inpatient appropriate because: Severity of illness  Planned Discharge Destination: Home     Time spent: 52 minutes  Author: Concepcion Riser, MD 04/26/2024 1:08 PM Secure chat 7am to 7pm For on call review [email or web address].

## 2024-04-26 NOTE — ED Notes (Signed)
 Intermittent SPO2 signal. Poor signal. Intemittent SPO2 100% RA. Alert, NAD, calm, interactive. No resps sx or distress. Finished eating.

## 2024-04-26 NOTE — Progress Notes (Signed)
 PHARMACY - ANTICOAGULATION CONSULT NOTE  Pharmacy Consult for heparin  Indication: chest pain/ACS, atrial fibrillation  Allergies  Allergen Reactions   Clonidine  Derivatives Other (See Comments)    Patch causes scars   Spironolactone  Rash    Patient Measurements: Height: 5' 7 (170.2 cm) Weight: 56.7 kg (125 lb) IBW/kg (Calculated) : 61.6 HEPARIN  DW (KG): 56.7  Vital Signs: Temp: 98.2 F (36.8 C) (09/13 0234) Temp Source: Oral (09/13 0234) BP: 119/81 (09/13 0515) Pulse Rate: 117 (09/13 0515)  Labs: Recent Labs    04/25/24 1613 04/25/24 1623 04/25/24 1832 04/25/24 2309 04/26/24 0155  HGB 9.9* 11.2*  --   --  9.5*  HCT 34.0* 33.0*  --   --  30.7*  PLT 181  --   --   --  182  APTT 36  --   --   --   --   LABPROT 29.2*  --   --   --   --   INR 2.6*  --   --   --   --   CREATININE 1.35* 1.20*  --   --   --   CKTOTAL 915*  --   --   --   --   CKMB 26.9*  --   --   --   --   TROPONINIHS 235*  --  526* 1,443* 1,833*    Estimated Creatinine Clearance: 40.7 mL/min (A) (by C-G formula based on SCr of 1.2 mg/dL (H)).  Assessment: 48 yoF presented to ED after syncopal episode. Pharmacy consulted to dose heparin  for ACS. PMH includes afib (Eliquis  at home)  -Hgb 9.5, plts 182 -Trop 1800 -Last dose of Eliquis  9/12 @0830   Goal of Therapy:  Heparin  level 0.3-0.7 units/ml aPTT 66-102 seconds Monitor platelets by anticoagulation protocol: Yes   Plan:  No bolus given recent eliquis  use Start heparin  infusion at 850 units/hr Check aPTT and anti-Xa level in 6 hours and daily while on heparin  Monitor with aPTT until correlates with anti-Xa level Continue to monitor H&H and platelets F/u cards consult  Lynwood Poplar, PharmD, BCPS Clinical Pharmacist 04/26/2024 5:55 AM

## 2024-04-26 NOTE — ED Notes (Signed)
 MD at [facility]

## 2024-04-27 ENCOUNTER — Inpatient Hospital Stay (HOSPITAL_COMMUNITY)

## 2024-04-27 ENCOUNTER — Other Ambulatory Visit (HOSPITAL_COMMUNITY)

## 2024-04-27 DIAGNOSIS — R55 Syncope and collapse: Secondary | ICD-10-CM | POA: Diagnosis not present

## 2024-04-27 DIAGNOSIS — I340 Nonrheumatic mitral (valve) insufficiency: Secondary | ICD-10-CM

## 2024-04-27 LAB — APTT
aPTT: 157 s — ABNORMAL HIGH (ref 24–36)
aPTT: 78 s — ABNORMAL HIGH (ref 24–36)
aPTT: 93 s — ABNORMAL HIGH (ref 24–36)

## 2024-04-27 LAB — CBC WITH DIFFERENTIAL/PLATELET
Abs Immature Granulocytes: 0.06 10*3/uL (ref 0.00–0.07)
Basophils Absolute: 0.1 10*3/uL (ref 0.0–0.1)
Basophils Relative: 1 %
Eosinophils Absolute: 0.3 10*3/uL (ref 0.0–0.5)
Eosinophils Relative: 3 %
HCT: 30.7 % — ABNORMAL LOW (ref 36.0–46.0)
Hemoglobin: 9.5 g/dL — ABNORMAL LOW (ref 12.0–15.0)
Immature Granulocytes: 1 %
Lymphocytes Relative: 27 %
Lymphs Abs: 2.8 10*3/uL (ref 0.7–4.0)
MCH: 22.4 pg — ABNORMAL LOW (ref 26.0–34.0)
MCHC: 30.9 g/dL (ref 30.0–36.0)
MCV: 72.4 fL — ABNORMAL LOW (ref 80.0–100.0)
Monocytes Absolute: 0.8 10*3/uL (ref 0.1–1.0)
Monocytes Relative: 7 %
Neutro Abs: 6.5 10*3/uL (ref 1.7–7.7)
Neutrophils Relative %: 61 %
Platelets: 164 10*3/uL (ref 150–400)
RBC: 4.24 MIL/uL (ref 3.87–5.11)
RDW: 17.1 % — ABNORMAL HIGH (ref 11.5–15.5)
WBC: 10.5 10*3/uL (ref 4.0–10.5)
nRBC: 0.2 % (ref 0.0–0.2)

## 2024-04-27 LAB — RESPIRATORY PANEL BY PCR

## 2024-04-27 LAB — ECHOCARDIOGRAM COMPLETE
AR max vel: 2.85 cm2
AV Peak grad: 1.6 mmHg
Ao pk vel: 0.64 m/s
Area-P 1/2: 5.88 cm2
Height: 67 in
S' Lateral: 3.1 cm
Weight: 2000 [oz_av]

## 2024-04-27 LAB — MAGNESIUM: Magnesium: 1.5 mg/dL — ABNORMAL LOW (ref 1.7–2.4)

## 2024-04-27 LAB — COMPREHENSIVE METABOLIC PANEL WITH GFR
ALT: 58 U/L — ABNORMAL HIGH (ref 0–44)
AST: 87 U/L — ABNORMAL HIGH (ref 15–41)
Albumin: 3 g/dL — ABNORMAL LOW (ref 3.5–5.0)
Alkaline Phosphatase: 68 U/L (ref 38–126)
Anion gap: 9 (ref 5–15)
BUN: 23 mg/dL (ref 8–23)
CO2: 20 mmol/L — ABNORMAL LOW (ref 22–32)
Calcium: 8.7 mg/dL — ABNORMAL LOW (ref 8.9–10.3)
Chloride: 113 mmol/L — ABNORMAL HIGH (ref 98–111)
Creatinine, Ser: 1 mg/dL (ref 0.44–1.00)
GFR, Estimated: >60 mL/min
Glucose, Bld: 92 mg/dL (ref 70–99)
Potassium: 3.7 mmol/L (ref 3.5–5.1)
Sodium: 142 mmol/L (ref 135–145)
Total Bilirubin: 0.5 mg/dL (ref 0.0–1.2)
Total Protein: 5.8 g/dL — ABNORMAL LOW (ref 6.5–8.1)

## 2024-04-27 LAB — C-REACTIVE PROTEIN: CRP: 1 mg/dL — ABNORMAL HIGH

## 2024-04-27 LAB — AMMONIA: Ammonia: 53 umol/L — ABNORMAL HIGH (ref 9–35)

## 2024-04-27 LAB — PROCALCITONIN: Procalcitonin: 13.5 ng/mL

## 2024-04-27 LAB — HEPARIN LEVEL (UNFRACTIONATED)
Heparin Unfractionated: >1.1 [IU]/mL — ABNORMAL HIGH (ref 0.30–0.70)
Heparin Unfractionated: >1.1 [IU]/mL — ABNORMAL HIGH (ref 0.30–0.70)

## 2024-04-27 LAB — MRSA NEXT GEN BY PCR, NASAL: MRSA by PCR Next Gen: NOT DETECTED

## 2024-04-27 MED ORDER — POLYETHYLENE GLYCOL 3350 17 G PO PACK
17.0000 g | PACK | Freq: Two times a day (BID) | ORAL | Status: DC
  Administered 2024-04-27 – 2024-05-03 (×9): 17 g via ORAL
  Filled 2024-04-27 (×10): qty 1

## 2024-04-27 MED ORDER — MAGNESIUM SULFATE 4 GM/100ML IV SOLN
4.0000 g | Freq: Once | INTRAVENOUS | Status: AC
  Administered 2024-04-27: 4 g via INTRAVENOUS
  Filled 2024-04-27: qty 100

## 2024-04-27 MED ORDER — DOCUSATE SODIUM 100 MG PO CAPS
200.0000 mg | ORAL_CAPSULE | Freq: Two times a day (BID) | ORAL | Status: DC
  Administered 2024-04-27 – 2024-05-02 (×8): 200 mg via ORAL
  Filled 2024-04-27 (×10): qty 2

## 2024-04-27 NOTE — Evaluation (Addendum)
 Occupational Therapy Evaluation Patient Details Name: Kimberly [name] MRN: [id number] DOB: 1955/12/02 Today's Date: 04/27/2024   History of Present Illness   Kimberly [name] is a 68 y.o. female admitted 04/25/24 for near syncope and fall where she was on the ground for ~4 hours. Work-up revealed sepsis of unknown etiology, NSTEMI, and rhabdomyolysis. CT demonstrated compression fracture of T8 vertebral body of indeterminate age. 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     Clinical Impressions At baseline, pt is Ind to Mod I with UB ADLs, completes LB ADLs Mod I to Mod assist, performs functional mobility with a manual wheelchair or RW, and receives assistance for IADLs. Pt now presents with decreased activity tolerance, impaired cognition, generalized B UE strength, decreased balance, impaired cardiopulmonary status, and decreased safety and independence with functional tasks. Pt currently demonstrates ability to complete ADLs largely with Set to Total assist +2 and functional step-pivot transfers with a RW with Mod assist +2. Pt participated well in session but was limited by her cardiopulmonary status. Pt tachycardic throughout session, HR 130-140s. Orthostatics taken (see below). Pt with hypertension in supine/sitting. She reported some dizziness in standing. O2 sat >/92% on RA. Pt will benefit from acute skilled OT services to address deficits outlined below and to increase safety and independence with functional tasks. Post acute discharge, pt will benefit from intensive inpatient skilled rehab services < 3 hours per day to maximize rehab potential.     04/27/24 1342  Vital Signs  Pulse Rate Source Monitor  BP Location Left Arm  BP Method Automatic  Patient Position (if appropriate) Orthostatic Vitals   Orthostatic Lying   BP- Lying (!) 132/108 (MAP 117)  Pulse- Lying 111  Orthostatic Sitting  BP- Sitting (!) 133/102  Pulse- Sitting 115  Orthostatic Standing at 0 minutes  BP- Standing at 0 minutes (!) 118/96 (map 104)  Pulse- Standing at 0 minutes 118  Oxygen Therapy  SpO2 92 %  O2 Device Room Air       If plan is discharge home, recommend the following:   Two people to help with walking and/or transfers;Two people to help with bathing/dressing/bathroom;Assistance with cooking/housework;Direct supervision/assist for medications management;Direct supervision/assist for financial management;Assist for transportation;Help with stairs or ramp for entrance     Functional Status Assessment   Patient has had a recent decline in their functional status and demonstrates the ability to make significant improvements in function in a reasonable and predictable amount of time.     Equipment Recommendations   BSC/3in1;Tub/shower bench     Recommendations for Other Services         Precautions/Restrictions   Precautions Precautions: Fall Recall of Precautions/Restrictions: Impaired Precaution/Restrictions Comments: Followed back precautions d/t pain and T8 compression fx. Educated pt on log roll technique and brace. Required Braces or Orthoses: Spinal Brace Spinal Brace: Thoracolumbosacral orthotic;Applied in sitting position (only when ambulating) Restrictions Weight Bearing Restrictions Per Provider Order: No     Mobility Bed Mobility Overal bed mobility: Needs Assistance Bed Mobility: Rolling, Sidelying to Sit Rolling: Min assist, +2 for physical assistance, Used rails Sidelying to sit: Min assist, +2 for physical assistance, HOB elevated, Used rails       General bed mobility comments: Educated pt on log roll technique. Cues for sequencing. Assist to bring LUE onto bed rail. Pt brought LE towards EOB, assist to bring  legs off and elevate trunk. Used bed pad to  scoot pt fwd til feet flat.    Transfers Overall transfer level: Needs assistance Equipment used: Rolling walker (2 wheels) Transfers: Sit to/from Stand, Bed to chair/wheelchair/BSC Sit to Stand: Min assist, +2 physical assistance, From elevated surface     Step pivot transfers: Mod assist, +2 physical assistance, From elevated surface     General transfer comment: Pt stood from slightly raised bed height. Cued proper hand placement using RW. Powered up with minA x2. She fatigued, required seated rest break. Stood again and maintained static stance while pericare was addressed. Transferred to recliner chair positioned close by on right. Fair eccentric control.      Balance Overall balance assessment: Needs assistance Sitting-balance support: Bilateral upper extremity supported, Feet supported Sitting balance-Leahy Scale: Fair Sitting balance - Comments: Pt sat EOB with close supervision-CGA. Donned TLSO brace.   Standing balance support: Bilateral upper extremity supported, During functional activity, Reliant on assistive device for balance Standing balance-Leahy Scale: Poor Standing balance comment: Pt dependent on RW                           ADL either performed or assessed with clinical judgement   ADL Overall ADL's : Needs assistance/impaired Eating/Feeding: Set up;Sitting   Grooming: Set up;Sitting   Upper Body Bathing: Minimal assistance;Moderate assistance;Cueing for compensatory techniques;Sitting   Lower Body Bathing: Maximal assistance;Total assistance;Sitting/lateral leans;Sit to/from stand;Cueing for compensatory techniques;Cueing for sequencing;Cueing for safety;+2 for physical assistance;+2 for safety/equipment   Upper Body Dressing : Minimal assistance;Sitting;Cueing for compensatory techniques   Lower Body Dressing: Maximal assistance;Cueing for compensatory techniques;Sitting/lateral leans;Sit to/from stand;Cueing for safety;+2 for physical  assistance;+2 for safety/equipment   Toilet Transfer: Moderate assistance;+2 for physical assistance;+2 for safety/equipment;BSC/3in1;Rolling walker (2 wheels);Cueing for safety;Cueing for sequencing (step-pivot transfer) Toilet Transfer Details (indicate cue type and reason): simulated bed to recliner Toileting- Clothing Manipulation and Hygiene: Total assistance;+2 for physical assistance;+2 for safety/equipment;Sit to/from stand         General ADL Comments: Pt with decreased activity tolerance. When pt engaged in bed mobility and stood at EOB with OT/PT it was found the pt was unaware she had been sitting on a bed pan, had wet linens, and that she had had a BM. OT/PT worked with pt on bed mobility, ADLs, and funcitonal transfers, and removed soiled linens.     Vision Baseline Vision/History: 1 Wears glasses (readers) Ability to See in Adequate Light: 1 Impaired Patient Visual Report: No change from baseline Vision Assessment?: Wears glasses for reading Additional Comments: Vision WWFL for tasks assessed; not formally screened     Perception         Praxis         Pertinent Vitals/Pain Pain Assessment Pain Assessment: Faces Faces Pain Scale: Hurts even more Pain Location: back, throat Pain Descriptors / Indicators: Grimacing, Guarding, Discomfort, Aching Pain Intervention(s): Monitored during session, Limited activity within patient's tolerance, Repositioned     Extremity/Trunk Assessment Upper Extremity Assessment Upper Extremity Assessment: Right hand dominant;RUE deficits/detail;LUE deficits/detail;Generalized weakness RUE Deficits / Details: generalized weakness LUE Deficits / Details: Hx of CVA affecting L side; generalized weakness; impaired AROM shoulder flexion to aproximately 80 degrees and PROM to approximately 110 degrees with pt reproting soreness with PROM at baseline; pt with impaired AROM elbow extension but largely WFL at baseline with pt largely preferring  to hold elbow in flexion unless cued to extend elbow at baseline; pt able  to grossly grasp objects with hand but with noted contracture of DIP joints of digits 2-5 and tightnes in in PIP joints of digits 2-5 at baseline; decreased coordination LUE Coordination: decreased fine motor;decreased gross motor   Lower Extremity Assessment Lower Extremity Assessment: Generalized weakness;LLE deficits/detail LLE Deficits / Details: Hx of hemiplegia LLE Sensation: decreased proprioception LLE Coordination: decreased gross motor   Cervical / Trunk Assessment Cervical / Trunk Assessment: Other exceptions Cervical / Trunk Exceptions: Mild endplate degenerative changes at C4-5, C5-6, and C6-7. T8 compression fracture.   Communication Communication Communication: Impaired Factors Affecting Communication: Hearing impaired   Cognition Arousal: Alert Behavior During Therapy: WFL for tasks assessed/performed Cognition: Cognition impaired, No family/caregiver present to determine baseline     Awareness: Intellectual awareness intact, Online awareness intact (Intermittent online awareness) Memory impairment (select all impairments): Short-term memory, Working memory Attention impairment (select first level of impairment): Selective attention, Alternating attention Executive functioning impairment (select all impairments): Problem solving, Organization, Reasoning OT - Cognition Comments: Pt AAOx4 and pleasant throughotu session. Pt cognition largely [place] for tasskassessed but with noted deficitis above.                 Following commands: Impaired Following commands impaired: Follows one step commands with increased time, Follows multi-step commands inconsistently     Cueing  General Comments   Cueing Techniques: Verbal cues;Gestural cues  Pt tachycardic throughout session, HR 130-140s. Orthostatics taken, (-). Pt with hypertension in supine/sitting. She reported some dizziness in standing. O2  sat >/92% on RA.   Exercises     Shoulder Instructions      Home Living Family/patient expects to be discharged to:: Private residence Living Arrangements: Alone Available Help at Discharge: Personal care attendant;Family;Available PRN/intermittently (Caregiver 7 days/wk for 2 hours a day; Daughter visits every night) Type of Home: Apartment Home Access: Stairs to enter [employer] of Steps: 2 Entrance Stairs-Rails: None Home Layout: One level     Bathroom Shower/Tub: Chief Strategy Officer: Standard Bathroom Accessibility: Yes How Accessible: Accessible via walker Home Equipment: Rolling Walker (2 wheels);Wheelchair - manual;Shower seat          Prior Functioning/Environment Prior Level of Function : Needs assist       Physical Assist : ADLs (physical);Mobility (physical) Mobility (physical): Gait ADLs (physical): Bathing;Dressing;Toileting;IADLs Mobility Comments: Uses a RW to walk to the bathroom, otherwise uses a manual w/c. Transfers via stand or step pivot using RW. Pt reports she is able to propel herself in w/c on good days. 1 fall leading to current admisison. ADLs Comments: Assist with LB ADLs from daughter; assist with tub transfer; intermittent assist with toilet hygeine; sits in w/c for grooming with Mod I; daughter assists with medications using pill planner; has caregiver who assists with meal prep; daughter assists with dinners and other IADLs, including transportation    OT Problem List: Decreased strength;Decreased activity tolerance;Impaired balance (sitting and/or standing);Decreased safety awareness;Cardiopulmonary status limiting activity   OT Treatment/Interventions: Self-care/ADL training;Therapeutic exercise;Energy conservation;DME and/or AE instruction;Therapeutic activities;Cognitive remediation/compensation;Patient/family education;Balance training      OT Goals(Current goals can be found in the care plan section)    Acute Rehab OT Goals Patient Stated Goal: to return home OT Goal Formulation: With patient Time For Goal Achievement: 05/11/24 Potential to Achieve Goals: Good ADL Goals Pt Will Perform Grooming: with min assist;standing Pt Will Perform Upper Body Bathing: with contact guard assist;sitting (with adaptive equipment as needed; adhering to back precautions) Pt Will Perform Lower Body Bathing: with  mod assist;sitting/lateral leans;sit to/from stand (with adaptive equipment as needed; adhering to back precautions) Pt Will Perform Lower Body Dressing: with mod assist;sitting/lateral leans;sit to/from stand (with adaptive equipment as needed; adhering to back precautions) Pt Will Transfer to Toilet: with min assist;ambulating;bedside commode (with least restrictive AD; adhering to back precautions) Pt Will Perform Toileting - Clothing Manipulation and hygiene: with mod assist;sitting/lateral leans;sit to/from stand (adhering to back precautions) Pt Will Perform Tub/Shower Transfer: Tub transfer;with mod assist;ambulating;tub bench (with least restricitve AD; adhering to back precautions)   OT Frequency:  Min 2X/week    Co-evaluation PT/OT/SLP Co-Evaluation/Treatment: Yes Reason for Co-Treatment: For patient/therapist safety;To address functional/ADL transfers PT goals addressed during session: Mobility/safety with mobility;Balance;Proper use of DME OT goals addressed during session: ADL's and self-care      AM-PAC OT 6 Clicks Daily Activity     Outcome Measure Help from another person eating meals?: A Little Help from another person taking care of personal grooming?: A Little Help from another person toileting, which includes using toliet, bedpan, or urinal?: Total Help from another person bathing (including washing, rinsing, drying)?: A Lot Help from another person to put on and taking off regular upper body clothing?: A Little Help from another person to put on and taking off regular  lower body clothing?: A Lot 6 Click Score: 14   End of Session Equipment Utilized During Treatment: Gait belt;Rolling walker (2 wheels) Nurse Communication: Mobility status;Other (comment) (Vital signs. Pt sitting in chair eating lunch at end of session)  Activity Tolerance: Patient tolerated treatment well Patient left: in chair;with call bell/phone within reach;with chair alarm set  OT Visit Diagnosis: Unsteadiness on feet (R26.81);Other abnormalities of gait and mobility (R26.89);Muscle weakness (generalized) (M62.81);History of falling (Z91.81);Other symptoms and signs involving cognitive function                Time: 1332-1409 OT Time Calculation (min): 37 min Charges:  OT General Charges $OT Visit: 1 Visit OT Evaluation $OT Eval Moderate Complexity: 1 Mod  Margarie Rockey HERO., OTR/L, MA Acute Rehab [phone]   Margarie FORBES Horns 04/27/2024, 5:45 PM

## 2024-04-27 NOTE — Progress Notes (Signed)
 PHARMACY - ANTICOAGULATION CONSULT NOTE  Pharmacy Consult for heparin  Indication: chest pain/ACS, atrial fibrillation  Allergies  Allergen Reactions   Catapres -Tts [Clonidine ] Other (See Comments)    Reaction caused by clonidine  patch but not a direct result of the medication Patch adhesive caused skin tears resulting in scarring   Aldactone  [Spironolactone ] Rash    Patient Measurements: Height: 5' 7 (170.2 cm) Weight: 56.7 kg (125 lb) IBW/kg (Calculated) : 61.6 HEPARIN  DW (KG): 56.7  Vital Signs: Temp: 98.2 F (36.8 C) (09/14 0430) Temp Source: Oral (09/14 0430)  Labs: Recent Labs    04/25/24 1613 04/25/24 1623 04/25/24 1832 04/26/24 0155 04/26/24 0337 04/26/24 0850 04/26/24 1507 04/27/24 0043  HGB 9.9* 11.2*  --  9.5*  --   --   --   --   HCT 34.0* 33.0*  --  30.7*  --   --   --   --   PLT 181  --   --  182  --   --   --   --   APTT 36  --   --   --   --   --  42* 157*  LABPROT 29.2*  --   --   --   --   --   --   --   INR 2.6*  --   --   --   --   --   --   --   HEPARINUNFRC  --   --   --   --   --   --  >1.10* >1.10*  CREATININE 1.35* 1.20*  --   --  1.17*  --   --   --   CKTOTAL 915*  --   --   --   --  2,468*  --   --   CKMB 26.9*  --   --   --   --   --   --   --   TROPONINIHS 235*  --    < > 1,833*  --  1,809* 1,499*  --    < > = values in this interval not displayed.    Estimated Creatinine Clearance: 41.8 mL/min (A) (by C-G formula based on SCr of 1.17 mg/dL (H)).  Assessment: 80 yoF presented to ED after syncopal episode. Pharmacy consulted to dose heparin  for ACS. PMH includes afib (Eliquis  at home - LD 9/12 @ 0830)  9/14 AM update: -aPTT therapeutic at 78 seconds on heparin  800 units/hr -Hgb 9.5, plts 164 (WNL) -Per RN, no signs of bleeding or pauses in infusion  Goal of Therapy:  Heparin  level 0.3-0.7 units/ml aPTT 66-102 seconds Monitor platelets by anticoagulation protocol: Yes   Plan:  Continue heparin  800 units/hr Check confirmatory  aPTT and anti-Xa level in 8 hours and daily while on heparin  Monitor with aPTT until correlates with anti-Xa level Continue to monitor H&H and platelets F/u cards consult  Izetta Carl, PharmD PGY1 Pharmacy Resident [facility]  04/27/2024 7:55 AM

## 2024-04-27 NOTE — Progress Notes (Signed)
 Echocardiogram 2D Echocardiogram has been performed.  Kimberly [name] 04/27/2024, 5:35 PM

## 2024-04-27 NOTE — Progress Notes (Signed)
 PROGRESS NOTE                                                                                                                                                                                                             Patient Demographics:    Kimberly [name], is a 68 y.o. female, DOB - January 28, 1956, FMW:[id number]  Outpatient Primary MD for the patient is Georgina Speaks, FNP    LOS - 2  Admit date - 04/25/2024    Chief Complaint  Patient presents with   Fall       Brief Narrative (HPI from H&P)    68 y.o. 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   Patient is found to have elevated white count, tachycardia, lactic acidosis, elevated troponin. Admitted to [facility] service with cardiology evaluation.   Subjective:    Kimberly [name] today has, No headache, No chest pain, No abdominal pain - No Nausea, No new weakness tingling or numbness, no shortness of breath, ongoing back pain.   Assessment  & Plan :    Sepsis unknown etiology- Patient presented with tachycardia, tachypnea, lactic acidosis, elevated white count. COVID respiratory viral panel negative, HIV nonreactive, blood cultures pending. CT chest abdomen pelvis showed compression fracture T8, findings of pulmonary hypertension, interstitial chronic lung changes increased from prior exam.  Currently no diarrhea, no chest or abdominal pain no cough.  Blood cultures are pending, sepsis pathophysiology seems to have improved, continue gentle IV fluids and antibiotics.  Advance activity and titrate down antibiotics as appropriate   Paroxysmal A-fib with  RVR with elevated troponin -  Possibly demand ischemia from sepsis and atrial fibrillation.  Seen by cardiology, on paren drip, chest pain-free, now placed on Coreg  initially was on Cardizem , continue telemetry monitoring and obtain echocardiogram   Near syncope Status post mechanical fall, T8 compression fracture & Rhabdomyolysis - Patient does have a history of dizziness. Continue telemetry rule out cardiac arrhythmias. Echocardiogram pending. Gentle IV hydration.  Trend CK. PT/ OT evaluation, out of bed to chair.   Acute kidney injury - Possibly prerenal.  Hydrate and  monitor.  Refused labs morning of 04/27/2024.  Counseled.   Elevated LFTs - In the setting of sepsis and rhabdomyolysis, pain-free, CT abdomen pelvis stable, will trend.      Condition - Extremely Guarded  Family Communication  :  None  Code Status :  Full  Consults  :  Cards  PUD Prophylaxis :    Procedures  :     TTE -  CT head.  Nonacute.    CT chest abdomen pelvis.  Compression fracture of T8 vertebral body of indeterminate age. Please refer to same day dedicated CT. Aneurysmal dilation of ascending aorta and infrarenal abdominal aorta. Enlarged pulmonary artery which can be seen the setting of pulmonary artery hypertension. Stigmata of interstitial and chronic lung changes likely sequela from prior infection/inflammation, increased to prior exam from 2023. No suspicious pulmonary nodule.  CT C-spine.  1. No acute abnormality of the cervical spine related to the reported polytrauma and fall. 2. Mild endplate degenerative changes at C4-5, C5-6, and C6-7.  CT T-spine.   1. Acute superior endplate compression fracture of T8 with 5 mm loss of height compared to prior study, approximately 30%. No retropulsed bone is present. No other acute fractures are present.   CT L-spine. 1. No evidence of acute traumatic injury. 2. Atherosclerotic calcifications in the abdominal aorta with infrarenal abdominal aortic aneurysm. The  maximal transverse diameter of 3.0 cm. Recommend CT angio of the abdomen and pelvis in 3 years. 3. 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      Disposition Plan  :    Status is: Inpatient  DVT Prophylaxis  :  Hep gtt, SCDs    Lab Results  Component Value Date   PLT 182 04/26/2024    Diet :  Diet Order             Diet Heart Room service appropriate? Yes; Fluid consistency: Thin  Diet effective now                    Inpatient Medications  Scheduled Meds:  aspirin  EC  81 mg Oral Daily   carvedilol   12.5 mg Oral BID WC   docusate sodium   200 mg Oral BID   neomycin -bacitracin -polymyxin  1 Application Apply externally BID   polyethylene glycol  17 g Oral BID   umeclidinium-vilanterol  1 puff Inhalation Daily   Continuous Infusions:  azithromycin  500 mg (04/27/24 0618)   ceFEPime  (MAXIPIME ) IV 2 g (04/27/24 0909)   heparin  800 Units/hr (04/27/24 0153)   vancomycin  150 mL/hr at 04/26/24 2103   PRN Meds:.acetaminophen  **OR** acetaminophen , albuterol , ondansetron  **OR** ondansetron  (ZOFRAN ) IV, oxyCODONE        Objective:   Vitals:   04/26/24 1746 04/26/24 2050 04/27/24 0010 04/27/24 0430  BP: (!) 116/97     Pulse: (!) 108     Resp:      Temp:  99.1 F (37.3 C) 98.8 F (37.1 C) 98.2 F (36.8 C)  TempSrc:  Oral Axillary Oral  SpO2:      Weight:      Height:        Wt Readings from Last 3 Encounters:  04/25/24 56.7 kg  04/02/24 59 kg  10/09/23 63.1 kg     Intake/Output Summary (Last 24 hours) at 04/27/2024 1015 Last data filed at 04/27/2024 0400 Gross per 24 hour  Intake 437.75 ml  Output 1100 ml  Net -662.25 ml     Physical Exam  Awake Alert, No new  F.N deficits, Normal affect [place].AT,PERRAL Supple Neck, No JVD,   Symmetrical Chest wall movement, Good air movement bilaterally, CTAB RRR,No Gallops,Rubs or new Murmurs,  +ve B.Sounds, Abd Soft, No tenderness,   No Cyanosis, Clubbing or edema         Data Review:    Recent  Labs  Lab 04/25/24 1613 04/25/24 1623 04/26/24 0155  WBC 18.4*  --  17.4*  HGB 9.9* 11.2* 9.5*  HCT 34.0* 33.0* 30.7*  PLT 181  --  182  MCV 76.6*  --  72.4*  MCH 22.3*  --  22.4*  MCHC 29.1*  --  30.9  RDW 16.6*  --  16.5*  LYMPHSABS 1.8  --   --   MONOABS 1.0  --   --   EOSABS 0.0  --   --   BASOSABS 0.0  --   --     Recent Labs  Lab 04/25/24 1613 04/25/24 1623 04/25/24 2000 04/25/24 2309 04/26/24 0156 04/26/24 0337 04/26/24 0850  NA 143 145  --   --   --  141  --   K 3.7 3.6  --   --   --  4.1  --   CL 109 113*  --   --   --  113*  --   CO2 16*  --   --   --   --  16*  --   ANIONGAP 18*  --   --   --   --  12  --   GLUCOSE 168* 166*  --   --   --  163*  --   BUN 28* 28*  --   --   --  27*  --   CREATININE 1.35* 1.20*  --   --   --  1.17*  --   AST 72*  --   --   --   --  88*  --   ALT 48*  --   --   --   --  46*  --   ALKPHOS 69  --   --   --   --  60  --   BILITOT 1.4*  --   --   --   --  0.6  --   ALBUMIN 4.0  --   --   --   --  3.4*  --   PROCALCITON  --   --  8.91  --   --   --   --   LATICACIDVEN  --  5.4*  --  3.3* 3.2*  --  2.9*  INR 2.6*  --   --   --   --   --   --   CALCIUM  9.4  --   --   --   --  8.2*  --       Recent Labs  Lab 04/25/24 1613 04/25/24 1623 04/25/24 2000 04/25/24 2309 04/26/24 0156 04/26/24 0337 04/26/24 0850  PROCALCITON  --   --  8.91  --   --   --   --   LATICACIDVEN  --  5.4*  --  3.3* 3.2*  --  2.9*  INR 2.6*  --   --   --   --   --   --   CALCIUM  9.4  --   --   --   --  8.2*  --     --------------------------------------------------------------------------------------------------------------- Lab Results  Component Value Date   CHOL 118 04/02/2024   HDL  52 04/02/2024   LDLCALC 48 04/02/2024   TRIG 92 04/02/2024   CHOLHDL 2.3 04/02/2024    Lab Results  Component Value Date   HGBA1C 6.2 (H) 04/02/2024   No results for input(s): TSH, T4TOTAL, FREET4, T3FREE, THYROIDAB in the last 72 hours. No  results for input(s): VITAMINB12, FOLATE, FERRITIN, TIBC, IRON, RETICCTPCT in the last 72 hours. ------------------------------------------------------------------------------------------------------------------ Cardiac Enzymes Recent Labs  Lab 04/25/24 1613  CKMB 26.9*    Micro Results Recent Results (from the past 240 hours)  Culture, blood (single)     Status: None (Preliminary result)   Collection Time: 04/25/24  6:32 PM   Specimen: BLOOD RIGHT ARM  Result Value Ref Range Status   Specimen Description BLOOD RIGHT ARM  Final   Special Requests   Final    BOTTLES DRAWN AEROBIC AND ANAEROBIC Blood Culture adequate volume   Culture   Final    NO GROWTH 2 DAYS Performed at [facility], [address] [place], [place] [address]    Report Status PENDING  Incomplete  Resp panel by RT-PCR (RSV, Flu A&B, Covid) Anterior Nasal Swab     Status: None   Collection Time: 04/25/24 11:09 PM   Specimen: Anterior Nasal Swab  Result Value Ref Range Status   SARS Coronavirus 2 by RT PCR NEGATIVE NEGATIVE Final   Influenza A by PCR NEGATIVE NEGATIVE Final   Influenza B by PCR NEGATIVE NEGATIVE Final    Comment: (NOTE) The Xpert Xpress SARS-CoV-2/FLU/RSV plus assay is intended as an aid in the diagnosis of influenza from Nasopharyngeal swab specimens and should not be used as a sole basis for treatment. Nasal washings and aspirates are unacceptable for Xpert Xpress SARS-CoV-2/FLU/RSV testing.  Fact Sheet for Patients: BloggerCourse.com  Fact Sheet for Healthcare Providers: SeriousBroker.it  This test is not yet approved or cleared by the [place]  FDA and has been authorized for detection and/or diagnosis of SARS-CoV-2 by FDA under an Emergency Use Authorization (EUA). This EUA will remain in effect (meaning this test can be used) for the duration of the COVID-19 declaration under Section 564(b)(1) of the Act, 21  U.S.C. section 360bbb-3(b)(1), unless the authorization is terminated or revoked.     Resp Syncytial Virus by PCR NEGATIVE NEGATIVE Final    Comment: (NOTE) Fact Sheet for Patients: BloggerCourse.com  Fact Sheet for Healthcare Providers: SeriousBroker.it  This test is not yet approved or cleared by the [place]  FDA and has been authorized for detection and/or diagnosis of SARS-CoV-2 by FDA under an Emergency Use Authorization (EUA). This EUA will remain in effect (meaning this test can be used) for the duration of the COVID-19 declaration under Section 564(b)(1) of the Act, 21 U.S.C. section 360bbb-3(b)(1), unless the authorization is terminated or revoked.  Performed at [facility], [address], [place], [place] [address]     Radiology Report CT CHEST ABDOMEN PELVIS W CONTRAST Result Date: 04/25/2024 CLINICAL DATA:  Blunt trauma EXAM: CT CHEST, ABDOMEN, AND PELVIS WITH CONTRAST TECHNIQUE: Multidetector CT imaging of the chest, abdomen and pelvis was performed following the standard protocol during bolus administration of intravenous contrast. RADIATION DOSE REDUCTION: This exam was performed according to the departmental dose-optimization program which includes automated exposure control, adjustment of the mA and/or kV according to patient size and/or use of iterative reconstruction technique. CONTRAST:  55mL OMNIPAQUE  IOHEXOL  350 MG/ML SOLN COMPARISON:  Chest radiograph same day, chest CT Dec 26, 2021 FINDINGS: CT CHEST FINDINGS Cardiovascular: The heart size is  enlarged. Aneurysmal dilation of ascending aorta measuring 4.3 cm. Enlarged pulmonary artery measuring 3.6 cm. Status post CABG. No pericardial effusion. Atherosclerotic calcifications of coronary arteries. Mediastinum/Nodes: No suspicious lymphadenopathy. Few subcentimeter bilateral lower paratracheal lymph nodes likely reactive. Lungs/Pleura: Upper lobe  predominant centrilobular emphysematous changes. Multi foci scarring/subsegmental atelectasis predominantly involving the right middle lobe and posteromedial left lower lobe likely sequela from chronic infection/inflammation, increased to prior. Progressive fibrotic changes and interlobular septal thickening in peripheral right upper and lower lobes. No posttraumatic changes of the lungs. There is posterior indentation/septation versus a diverticulum along the left lateral wall of the trachea in thoracic inlet. No pleural effusion. No pneumothorax. Musculoskeletal: No suspicious fracture or lytic sclerotic osseous lesion. Median sternotomy. CT ABDOMEN PELVIS FINDINGS Hepatobiliary: No suspicious liver lesion. No intrahepatic biliary dilatation. Gallbladder is unremarkable. Common bile duct is dilated up to 7 mm. No definite choledocholithiasis identified. Pancreas: Pancreas is normal.  No pancreatic ductal dilatation. Spleen: Normal size spleen. Adrenals/Urinary Tract: Both adrenal glands are normal. Left kidney subcentimeter cortical cysts which does not require imaging follow-up no hydronephrosis.Bladder is unremarkable. Stomach/Bowel: Stomach and bowel loops appear normal. No bowel dilatation, obstruction or wall thickening. Large stool is identified throughout the colon. Normal appendix. Vascular/Lymphatic: Atherosclerotic calcifications of aorta. Aneurysmal dilation of infrarenal aorta measuring 3.2 x 3 x 2.8 cm which does not require imaging follow-up. No suspicious lymphadenopathy. Reproductive: Calcified uterine fibroids.  No adnexal mass. Other: Tiny fat containing umbilical hernia a small fat containing ventral wall hernia with a defect measuring 4 mm about 8 cm proximal to umbilicus. Musculoskeletal: Degenerative changes of the spine with compression fracture of T8 vertebral body (40% height reduction), new to 2023. IMPRESSION: Compression fracture of T8 vertebral body of indeterminate age. Please refer  to same day dedicated CT. Aneurysmal dilation of ascending aorta and infrarenal abdominal aorta. Enlarged pulmonary artery which can be seen the setting of pulmonary artery hypertension. Stigmata of interstitial and chronic lung changes likely sequela from prior infection/inflammation, increased to prior exam from 2023. No suspicious pulmonary nodule. Additional findings as above. Electronically Signed   By: Megan  Zare M.D.   On: 04/25/2024 17:32   CT CERVICAL SPINE WO CONTRAST Result Date: 04/25/2024 EXAM: CT CERVICAL SPINE WITHOUT CONTRAST 04/25/2024 04:57:00 PM TECHNIQUE: CT of the cervical spine was performed without the administration of intravenous contrast. Multiplanar reformatted images are provided for review. Automated exposure control, iterative reconstruction, and/or weight based adjustment of the mA/kV was utilized to reduce the radiation dose to as low as reasonably achievable. COMPARISON: None available. CLINICAL HISTORY: Polytrauma, blunt. Fall; CT CHEST ABDOMEN PELVIS W CONTRAST; Polytrauma, blunt; CT HEAD WO CONTRAST; Head trauma, moderate-severe; CT CERVICAL SPINE WO CONTRAST; Polytrauma, blunt; CT L-SPINE NO CHARGE; CT T-SPINE NO CHARGE. Omni 350 55mL; See ED Notes:; Pt coming in after a fall pt takes elequis. Pt went to stand from wheelchair. Pt got light headed and dizzy and fell forward no syncope. ; Pt was down about 4 hours. Pt has abrasion to her chin. Pt has hx of stroke minor left sided weakness. Pt reports being sick FINDINGS: CERVICAL SPINE: BONES AND ALIGNMENT: No acute fracture or traumatic malalignment. Straightening of the normal cervical lordosis is stable. The patient is in a hard collar. DEGENERATIVE CHANGES: Mild endplate degenerative changes are again noted at C4-5, C5-6 and C6-7. SOFT TISSUES: No prevertebral soft tissue swelling. IMPRESSION: 1. No acute abnormality of the cervical spine related to the reported polytrauma and fall. 2. Mild endplate degenerative changes at  C4-5, C5-6, and C6-7. Electronically signed by: Lonni Necessary MD 04/25/2024 05:28 PM EDT RP Workstation: [id number]   CT HEAD WO CONTRAST Result Date: 04/25/2024 EXAM: CT HEAD WITHOUT CONTRAST 04/25/2024 04:57:00 PM TECHNIQUE: CT of the head was performed without the administration of intravenous contrast. Automated exposure control, iterative reconstruction, and/or weight based adjustment of the mA/kV was utilized to reduce the radiation dose to as low as reasonably achievable. COMPARISON: None available. CLINICAL HISTORY: Head trauma, moderate-severe. Fall; CT CHEST ABDOMEN PELVIS W CONTRAST; Polytrauma, blunt; CT HEAD WO CONTRAST; Head trauma, moderate-severe; CT CERVICAL SPINE WO CONTRAST; Polytrauma, blunt; CT L-SPINE NO CHARGE; CT T-SPINE NO CHARGE. Omni 350 55mL; See ED Notes:; Pt coming in after a fall pt takes elequis. Pt went to stand from wheelchair. Pt got light headed and dizzy and fell forward no syncope. ; Pt was down about 4 hours. Pt has abrasion to her chin. Pt has hx of stroke minor left sided weakness. Pt reports being sick FINDINGS: BRAIN AND VENTRICLES: Multiple remote cortical infarcts are again noted. Infarcts involve the posterior medial parietal lobes bilaterally, the inferior right temporal lobe and right occipital lobe in the anterior left middle frontal gyrus. Remote lacunar infarcts are present in the right caudate head, left lentiform nucleus and right thalamus. No acute hemorrhage. No evidence of acute infarct. No hydrocephalus. No extra-axial collection. No mass effect or midline shift. ORBITS: No acute abnormality. Soft tissue swelling extends to the periorbital regions bilaterally. SINUSES: Minimal fluid is present in the maxillary sinuses bilaterally without associated fracture. SOFT TISSUES AND SKULL: Frontal scalp soft tissue swelling is present without underlying fracture or foreign body. No skull fracture. IMPRESSION: 1. No acute intracranial abnormality related to  the head trauma. 2. Frontal scalp and bilateral periorbital soft tissue swelling without underlying fracture or foreign body. 3. Multiple remote cortical and lacunar infarcts as described above. Electronically signed by: Lonni Necessary MD 04/25/2024 05:26 PM EDT RP Workstation: [id number]   CT L-SPINE NO CHARGE Result Date: 04/25/2024 EXAM: CT OF THE LUMBAR SPINE WITHOUT CONTRAST 04/25/2024 04:57:00 PM TECHNIQUE: CT of the lumbar spine was performed without the administration of intravenous contrast. Multiplanar reformatted images are provided for review. Automated exposure control, iterative reconstruction, and/or weight based adjustment of the mA/kV was utilized to reduce the radiation dose to as low as reasonably achievable. COMPARISON: 02/13/2018. Images were not currently available. CLINICAL HISTORY: Fall; Polytrauma, blunt; Head trauma, moderate-severe; Pt coming in after a fall pt takes elequis. Pt went to stand from wheelchair. Pt got light headed and dizzy and fell forward no syncope. Pt was down about 4 hours. Pt has abrasion to her chin. Pt has hx of stroke minor left sided weakness. Pt reports being sick. FINDINGS: BONES AND ALIGNMENT: Normal vertebral body heights. No acute fracture or suspicious bone lesion. Normal alignment. DEGENERATIVE CHANGES: No significant degenerative changes. SOFT TISSUES: No acute abnormality. VASCULATURE: Atherosclerotic calcifications present in the abdominal aorta. Maximal transverse diameter is 3.0 cm. 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 IMPRESSION: 1. No evidence of acute traumatic injury. 2. Atherosclerotic calcifications in the abdominal aorta with infrarenal abdominal aortic aneurysm. The maximal transverse diameter of 3.0 cm. Recommend CT angio of the abdomen and pelvis in 3 years. 3. 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 Electronically signed by: Lonni Necessary MD 04/25/2024 05:16 PM EDT RP Workstation: [id number]    CT T-SPINE NO CHARGE Result Date: 04/25/2024 EXAM: CT THORACIC SPINE WITHOUT CONTRAST 04/25/2024 04:57:00 PM TECHNIQUE: CT  of the thoracic spine was performed without the administration of intravenous contrast. Multiplanar reformatted images are provided for review. Automated exposure control, iterative reconstruction, and/or weight based adjustment of the mA/kV was utilized to reduce the radiation dose to as low as reasonably achievable. COMPARISON: CT chest 12/26/2021. CLINICAL HISTORY: Patient with history of stroke and minor left-sided weakness, presenting after a fall from a wheelchair with subsequent head trauma and chin abrasion. The patient was down for approximately 4 hours before seeking medical attention. No syncope reported. FINDINGS: BONES AND ALIGNMENT: Acute superior endplate compression fracture of T8 with 5 mm loss of height compared to prior study, approximately 30%. No retropulsed bone is present. No other acute fractures are present. Normal alignment. DEGENERATIVE CHANGES: No significant degenerative changes. SOFT TISSUES: No acute abnormality. VASCULATURE: Atherosclerotic changes are present throughout the thoracic descending thoracic aorta and visualized abdominal aorta without focal aneurysm. Please see CT chest for further description of the ascending aorta. Coronary artery calcifications are present. IMPRESSION: 1. Acute superior endplate compression fracture of T8 with 5 mm loss of height compared to prior study, approximately 30%. No retropulsed bone is present. No other acute fractures are present. Electronically signed by: Lonni Necessary MD 04/25/2024 05:12 PM EDT RP Workstation: [id number]   DG Pelvis Portable Result Date: 04/25/2024 CLINICAL DATA:  Trauma fall EXAM: PORTABLE PELVIS 1-2 VIEWS COMPARISON:  06/23/2022 FINDINGS: Extensive vascular calcification. SI joints are non widened. Pubic symphysis and rami appear intact. Slightly limited assessment of right femoral neck  due to positioning. No definitive fracture or malalignment IMPRESSION: No definite acute osseous abnormality. Electronically Signed   By: Luke Bun M.D.   On: 04/25/2024 16:29   DG Chest Port 1 View Result Date: 04/25/2024 CLINICAL DATA:  Trauma fall EXAM: PORTABLE CHEST 1 VIEW COMPARISON:  06/23/2022, chest CT 12/26/2021 FINDINGS: Post sternotomy changes. Cardiomegaly with vascular congestion. Enlarged mediastinal silhouette compared to most recent prior radiograph but probably similar compared with scout CT image from May 2023. Aortic atherosclerosis. No pneumothorax. IMPRESSION: Cardiomegaly with vascular congestion. Enlarged mediastinal silhouette compared to most recent prior radiograph, probably partially attributed to portable technique, however consider chest CT correlation to exclude acute mediastinal pathology as patient has history of known ascending aortic aneurysm on the prior CT Electronically Signed   By: Luke Bun M.D.   On: 04/25/2024 16:28     Signature  -   Lavada Stank M.D on 04/27/2024 at 10:15 AM   -  To page go to [email or web address]

## 2024-04-27 NOTE — Progress Notes (Signed)
 PHARMACY - ANTICOAGULATION CONSULT NOTE  Pharmacy Consult for heparin  Indication: ACS/Afib  Labs: Recent Labs    04/25/24 1613 04/25/24 1623 04/25/24 1832 04/26/24 0155 04/26/24 0337 04/26/24 0850 04/26/24 1507 04/27/24 0043  HGB 9.9* 11.2*  --  9.5*  --   --   --   --   HCT 34.0* 33.0*  --  30.7*  --   --   --   --   PLT 181  --   --  182  --   --   --   --   APTT 36  --   --   --   --   --  42* 157*  LABPROT 29.2*  --   --   --   --   --   --   --   INR 2.6*  --   --   --   --   --   --   --   HEPARINUNFRC  --   --   --   --   --   --  >1.10* >1.10*  CREATININE 1.35* 1.20*  --   --  1.17*  --   --   --   CKTOTAL 915*  --   --   --   --  2,468*  --   --   CKMB 26.9*  --   --   --   --   --   --   --   TROPONINIHS 235*  --    < > 1,833*  --  1,809* 1,499*  --    < > = values in this interval not displayed.   Assessment: 68yo female now supratherapeutic on heparin  after rate change; no infusion issues or signs of bleeding on this shift per RN but upon review of the interop charting from day shift it appears the infusion had paused multiple times during the day, including a period of about an hour, which likely led to the low PTT earlier.  Goal of Therapy:  aPTT 66-102 seconds   Plan:  Decrease heparin  infusion by 2-3 units/kg/hr to 800 units/hr. Check PTT in 8 hours.   Marvetta Dauphin, PharmD, BCPS 04/27/2024 2:06 AM

## 2024-04-27 NOTE — Progress Notes (Signed)
 Pt. Declined a.m. labs despite education on best clinical outcome. RN Aisha made aware with bedside report.

## 2024-04-27 NOTE — Progress Notes (Signed)
 PHARMACY - ANTICOAGULATION CONSULT NOTE  Pharmacy Consult for heparin  Indication: chest pain/ACS, atrial fibrillation  Allergies  Allergen Reactions   Catapres -Tts [Clonidine ] Other (See Comments)    Reaction caused by clonidine  patch but not a direct result of the medication Patch adhesive caused skin tears resulting in scarring   Aldactone  [Spironolactone ] Rash    Patient Measurements: Height: 5' 7 (170.2 cm) Weight: 56.7 kg (125 lb) IBW/kg (Calculated) : 61.6 HEPARIN  DW (KG): 56.7  Vital Signs: Temp: 97.8 F (36.6 C) (09/14 1545) Temp Source: Oral (09/14 1545) BP: 128/105 (09/14 1545) Pulse Rate: 118 (09/14 1545)  Labs: Recent Labs    04/25/24 1613 04/25/24 1623 04/25/24 1832 04/26/24 0155 04/26/24 0337 04/26/24 0850 04/26/24 1507 04/27/24 0043 04/27/24 1252 04/27/24 2052  HGB 9.9* 11.2*  --  9.5*  --   --   --   --  9.5*  --   HCT 34.0* 33.0*  --  30.7*  --   --   --   --  30.7*  --   PLT 181  --   --  182  --   --   --   --  164  --   APTT 36  --   --   --   --   --  42* 157* 78* 93*  LABPROT 29.2*  --   --   --   --   --   --   --   --   --   INR 2.6*  --   --   --   --   --   --   --   --   --   HEPARINUNFRC  --   --   --   --   --   --  >1.10* >1.10*  --  >1.10*  CREATININE 1.35* 1.20*  --   --  1.17*  --   --   --  1.00  --   CKTOTAL 915*  --   --   --   --  2,468*  --   --   --   --   CKMB 26.9*  --   --   --   --   --   --   --   --   --   TROPONINIHS 235*  --    < > 1,833*  --  1,809* 1,499*  --   --   --    < > = values in this interval not displayed.    Estimated Creatinine Clearance: 48.9 mL/min (by C-G formula based on SCr of 1 mg/dL).  Assessment: 40 yoF presented to ED after syncopal episode. Pharmacy consulted to dose heparin  for ACS. PMH includes afib (Eliquis  at home - LD 9/12 @ 0830)  Heparin  level this evening is still falsely elevated from recent Eliquis .  APTT is within therapeutic range.  No overt bleeding or complications  noted.  Goal of Therapy:  Heparin  level 0.3-0.7 units/ml aPTT 66-102 seconds Monitor platelets by anticoagulation protocol: Yes   Plan:  Continue heparin  800 units/hr Monitor with aPTT until correlates with anti-Xa level Continue to monitor H&H and platelets F/u cards consult  Harlene Denna Berdine JONETTA ARABELLA, BCCP Clinical Pharmacist  04/27/2024 9:47 PM   [facility] pharmacy phone numbers are listed on [email or web address]

## 2024-04-27 NOTE — Progress Notes (Signed)
 Progress Note  Patient Name: Kimberly [name] Date of Encounter: 04/27/2024  [facility] Cardiologist: Lynwood Schilling, MD   Patient Profile     Subjective   Denies any chest pain or shortness of breath.  Inpatient Medications    Scheduled Meds:  aspirin  EC  81 mg Oral Daily   carvedilol   12.5 mg Oral BID WC   neomycin -bacitracin -polymyxin  1 Application Apply externally BID   umeclidinium-vilanterol  1 puff Inhalation Daily   Continuous Infusions:  azithromycin  500 mg (04/27/24 0618)   ceFEPime  (MAXIPIME ) IV 2 g (04/27/24 0909)   heparin  800 Units/hr (04/27/24 0153)   vancomycin  150 mL/hr at 04/26/24 2103   PRN Meds: acetaminophen  **OR** acetaminophen , albuterol , ondansetron  **OR** ondansetron  (ZOFRAN ) IV, oxyCODONE    Vital Signs    Vitals:   04/26/24 1746 04/26/24 2050 04/27/24 0010 04/27/24 0430  BP: (!) 116/97     Pulse: (!) 108     Resp:      Temp:  99.1 F (37.3 C) 98.8 F (37.1 C) 98.2 F (36.8 C)  TempSrc:  Oral Axillary Oral  SpO2:      Weight:      Height:        Intake/Output Summary (Last 24 hours) at 04/27/2024 0919 Last data filed at 04/27/2024 0400 Gross per 24 hour  Intake 437.75 ml  Output 1100 ml  Net -662.25 ml      04/25/2024    4:25 PM 04/02/2024    2:59 PM 10/09/2023    3:39 PM  Last 3 Weights  Weight (lbs) 125 lb 130 lb 139 lb 3.2 oz  Weight (kg) 56.7 kg 58.968 kg 63.141 kg      Telemetry    Normal sinus rhythm personally Reviewed  ECG    No new EKG to review- Personally Reviewed  Physical Exam   GEN: Well nourished, well developed in no acute distress HEENT: Normal NECK: No JVD; No carotid bruits LYMPHATICS: No lymphadenopathy CARDIAC:RRR, no murmurs, rubs, gallops RESPIRATORY:  Clear to auscultation without rales, wheezing or rhonchi  ABDOMEN: Soft, non-tender, non-distended MUSCULOSKELETAL:  No edema; No deformity  SKIN: Warm and dry NEUROLOGIC:  Alert and oriented x 3 PSYCHIATRIC:  Normal affect  Labs     High Sensitivity Troponin:   Recent Labs  Lab 04/25/24 1832 04/25/24 2309 04/26/24 0155 04/26/24 0850 04/26/24 1507  TROPONINIHS 526* 1,443* 1,833* 1,809* 1,499*      Chemistry Recent Labs  Lab 04/25/24 1613 04/25/24 1623 04/26/24 0337  NA 143 145 141  K 3.7 3.6 4.1  CL 109 113* 113*  CO2 16*  --  16*  GLUCOSE 168* 166* 163*  BUN 28* 28* 27*  CREATININE 1.35* 1.20* 1.17*  CALCIUM  9.4  --  8.2*  PROT 6.6  --  6.0*  ALBUMIN 4.0  --  3.4*  AST 72*  --  88*  ALT 48*  --  46*  ALKPHOS 69  --  60  BILITOT 1.4*  --  0.6  GFRNONAA 43*  --  51*  ANIONGAP 18*  --  12     Hematology Recent Labs  Lab 04/25/24 1613 04/25/24 1623 04/26/24 0155  WBC 18.4*  --  17.4*  RBC 4.44  --  4.24  HGB 9.9* 11.2* 9.5*  HCT 34.0* 33.0* 30.7*  MCV 76.6*  --  72.4*  MCH 22.3*  --  22.4*  MCHC 29.1*  --  30.9  RDW 16.6*  --  16.5*  PLT 181  --  182    BNPNo results for input(s): BNP, PROBNP in the last 168 hours.   DDimer No results for input(s): DDIMER in the last 168 hours.   CHA2DS2-VASc Score = 7   This indicates a 11.2% annual risk of stroke. The patient's score is based upon: CHF History: 1 HTN History: 1 Diabetes History: 0 Stroke History: 2 Vascular Disease History: 1 Age Score: 1 Gender Score: 1      Radiology    CT CHEST ABDOMEN PELVIS W CONTRAST Result Date: 04/25/2024 CLINICAL DATA:  Blunt trauma EXAM: CT CHEST, ABDOMEN, AND PELVIS WITH CONTRAST TECHNIQUE: Multidetector CT imaging of the chest, abdomen and pelvis was performed following the standard protocol during bolus administration of intravenous contrast. RADIATION DOSE REDUCTION: This exam was performed according to the departmental dose-optimization program which includes automated exposure control, adjustment of the mA and/or kV according to patient size and/or use of iterative reconstruction technique. CONTRAST:  55mL OMNIPAQUE  IOHEXOL  350 MG/ML SOLN COMPARISON:  Chest radiograph same day,  chest CT Dec 26, 2021 FINDINGS: CT CHEST FINDINGS Cardiovascular: The heart size is enlarged. Aneurysmal dilation of ascending aorta measuring 4.3 cm. Enlarged pulmonary artery measuring 3.6 cm. Status post CABG. No pericardial effusion. Atherosclerotic calcifications of coronary arteries. Mediastinum/Nodes: No suspicious lymphadenopathy. Few subcentimeter bilateral lower paratracheal lymph nodes likely reactive. Lungs/Pleura: Upper lobe predominant centrilobular emphysematous changes. Multi foci scarring/subsegmental atelectasis predominantly involving the right middle lobe and posteromedial left lower lobe likely sequela from chronic infection/inflammation, increased to prior. Progressive fibrotic changes and interlobular septal thickening in peripheral right upper and lower lobes. No posttraumatic changes of the lungs. There is posterior indentation/septation versus a diverticulum along the left lateral wall of the trachea in thoracic inlet. No pleural effusion. No pneumothorax. Musculoskeletal: No suspicious fracture or lytic sclerotic osseous lesion. Median sternotomy. CT ABDOMEN PELVIS FINDINGS Hepatobiliary: No suspicious liver lesion. No intrahepatic biliary dilatation. Gallbladder is unremarkable. Common bile duct is dilated up to 7 mm. No definite choledocholithiasis identified. Pancreas: Pancreas is normal.  No pancreatic ductal dilatation. Spleen: Normal size spleen. Adrenals/Urinary Tract: Both adrenal glands are normal. Left kidney subcentimeter cortical cysts which does not require imaging follow-up no hydronephrosis.Bladder is unremarkable. Stomach/Bowel: Stomach and bowel loops appear normal. No bowel dilatation, obstruction or wall thickening. Large stool is identified throughout the colon. Normal appendix. Vascular/Lymphatic: Atherosclerotic calcifications of aorta. Aneurysmal dilation of infrarenal aorta measuring 3.2 x 3 x 2.8 cm which does not require imaging follow-up. No suspicious  lymphadenopathy. Reproductive: Calcified uterine fibroids.  No adnexal mass. Other: Tiny fat containing umbilical hernia a small fat containing ventral wall hernia with a defect measuring 4 mm about 8 cm proximal to umbilicus. Musculoskeletal: Degenerative changes of the spine with compression fracture of T8 vertebral body (40% height reduction), new to 2023. IMPRESSION: Compression fracture of T8 vertebral body of indeterminate age. Please refer to same day dedicated CT. Aneurysmal dilation of ascending aorta and infrarenal abdominal aorta. Enlarged pulmonary artery which can be seen the setting of pulmonary artery hypertension. Stigmata of interstitial and chronic lung changes likely sequela from prior infection/inflammation, increased to prior exam from 2023. No suspicious pulmonary nodule. Additional findings as above. Electronically Signed   By: Megan  Zare M.D.   On: 04/25/2024 17:32   CT CERVICAL SPINE WO CONTRAST Result Date: 04/25/2024 EXAM: CT CERVICAL SPINE WITHOUT CONTRAST 04/25/2024 04:57:00 PM TECHNIQUE: CT of the cervical spine was performed without the administration of intravenous contrast. Multiplanar reformatted images are provided for review. Automated exposure control, iterative  reconstruction, and/or weight based adjustment of the mA/kV was utilized to reduce the radiation dose to as low as reasonably achievable. COMPARISON: None available. CLINICAL HISTORY: Polytrauma, blunt. Fall; CT CHEST ABDOMEN PELVIS W CONTRAST; Polytrauma, blunt; CT HEAD WO CONTRAST; Head trauma, moderate-severe; CT CERVICAL SPINE WO CONTRAST; Polytrauma, blunt; CT L-SPINE NO CHARGE; CT T-SPINE NO CHARGE. Omni 350 55mL; See ED Notes:; Pt coming in after a fall pt takes elequis. Pt went to stand from wheelchair. Pt got light headed and dizzy and fell forward no syncope. ; Pt was down about 4 hours. Pt has abrasion to her chin. Pt has hx of stroke minor left sided weakness. Pt reports being sick FINDINGS: CERVICAL  SPINE: BONES AND ALIGNMENT: No acute fracture or traumatic malalignment. Straightening of the normal cervical lordosis is stable. The patient is in a hard collar. DEGENERATIVE CHANGES: Mild endplate degenerative changes are again noted at C4-5, C5-6 and C6-7. SOFT TISSUES: No prevertebral soft tissue swelling. IMPRESSION: 1. No acute abnormality of the cervical spine related to the reported polytrauma and fall. 2. Mild endplate degenerative changes at C4-5, C5-6, and C6-7. Electronically signed by: Lonni Necessary MD 04/25/2024 05:28 PM EDT RP Workstation: [id number]   CT HEAD WO CONTRAST Result Date: 04/25/2024 EXAM: CT HEAD WITHOUT CONTRAST 04/25/2024 04:57:00 PM TECHNIQUE: CT of the head was performed without the administration of intravenous contrast. Automated exposure control, iterative reconstruction, and/or weight based adjustment of the mA/kV was utilized to reduce the radiation dose to as low as reasonably achievable. COMPARISON: None available. CLINICAL HISTORY: Head trauma, moderate-severe. Fall; CT CHEST ABDOMEN PELVIS W CONTRAST; Polytrauma, blunt; CT HEAD WO CONTRAST; Head trauma, moderate-severe; CT CERVICAL SPINE WO CONTRAST; Polytrauma, blunt; CT L-SPINE NO CHARGE; CT T-SPINE NO CHARGE. Omni 350 55mL; See ED Notes:; Pt coming in after a fall pt takes elequis. Pt went to stand from wheelchair. Pt got light headed and dizzy and fell forward no syncope. ; Pt was down about 4 hours. Pt has abrasion to her chin. Pt has hx of stroke minor left sided weakness. Pt reports being sick FINDINGS: BRAIN AND VENTRICLES: Multiple remote cortical infarcts are again noted. Infarcts involve the posterior medial parietal lobes bilaterally, the inferior right temporal lobe and right occipital lobe in the anterior left middle frontal gyrus. Remote lacunar infarcts are present in the right caudate head, left lentiform nucleus and right thalamus. No acute hemorrhage. No evidence of acute infarct. No hydrocephalus.  No extra-axial collection. No mass effect or midline shift. ORBITS: No acute abnormality. Soft tissue swelling extends to the periorbital regions bilaterally. SINUSES: Minimal fluid is present in the maxillary sinuses bilaterally without associated fracture. SOFT TISSUES AND SKULL: Frontal scalp soft tissue swelling is present without underlying fracture or foreign body. No skull fracture. IMPRESSION: 1. No acute intracranial abnormality related to the head trauma. 2. Frontal scalp and bilateral periorbital soft tissue swelling without underlying fracture or foreign body. 3. Multiple remote cortical and lacunar infarcts as described above. Electronically signed by: Lonni Necessary MD 04/25/2024 05:26 PM EDT RP Workstation: [id number]   CT L-SPINE NO CHARGE Result Date: 04/25/2024 EXAM: CT OF THE LUMBAR SPINE WITHOUT CONTRAST 04/25/2024 04:57:00 PM TECHNIQUE: CT of the lumbar spine was performed without the administration of intravenous contrast. Multiplanar reformatted images are provided for review. Automated exposure control, iterative reconstruction, and/or weight based adjustment of the mA/kV was utilized to reduce the radiation dose to as low as reasonably achievable. COMPARISON: 02/13/2018. Images were not currently available. CLINICAL HISTORY: Fall; Polytrauma,  blunt; Head trauma, moderate-severe; Pt coming in after a fall pt takes elequis. Pt went to stand from wheelchair. Pt got light headed and dizzy and fell forward no syncope. Pt was down about 4 hours. Pt has abrasion to her chin. Pt has hx of stroke minor left sided weakness. Pt reports being sick. FINDINGS: BONES AND ALIGNMENT: Normal vertebral body heights. No acute fracture or suspicious bone lesion. Normal alignment. DEGENERATIVE CHANGES: No significant degenerative changes. SOFT TISSUES: No acute abnormality. VASCULATURE: Atherosclerotic calcifications present in the abdominal aorta. Maximal transverse diameter is 3.0 cm. Moderate to  high-grade stenosis is present at the celiac trunk. IMPRESSION: 1. No evidence of acute traumatic injury. 2. Atherosclerotic calcifications in the abdominal aorta with infrarenal abdominal aortic aneurysm. The maximal transverse diameter of 3.0 cm. Recommend CT angio of the abdomen and pelvis in 3 years. 3. 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 Electronically signed by: Lonni Necessary MD 04/25/2024 05:16 PM EDT RP Workstation: [id number]   CT T-SPINE NO CHARGE Result Date: 04/25/2024 EXAM: CT THORACIC SPINE WITHOUT CONTRAST 04/25/2024 04:57:00 PM TECHNIQUE: CT of the thoracic spine was performed without the administration of intravenous contrast. Multiplanar reformatted images are provided for review. Automated exposure control, iterative reconstruction, and/or weight based adjustment of the mA/kV was utilized to reduce the radiation dose to as low as reasonably achievable. COMPARISON: CT chest 12/26/2021. CLINICAL HISTORY: Patient with history of stroke and minor left-sided weakness, presenting after a fall from a wheelchair with subsequent head trauma and chin abrasion. The patient was down for approximately 4 hours before seeking medical attention. No syncope reported. FINDINGS: BONES AND ALIGNMENT: Acute superior endplate compression fracture of T8 with 5 mm loss of height compared to prior study, approximately 30%. No retropulsed bone is present. No other acute fractures are present. Normal alignment. DEGENERATIVE CHANGES: No significant degenerative changes. SOFT TISSUES: No acute abnormality. VASCULATURE: Atherosclerotic changes are present throughout the thoracic descending thoracic aorta and visualized abdominal aorta without focal aneurysm. Please see CT chest for further description of the ascending aorta. Coronary artery calcifications are present. IMPRESSION: 1. Acute superior endplate compression fracture of T8 with 5 mm loss of height compared to prior study, approximately 30%.  No retropulsed bone is present. No other acute fractures are present. Electronically signed by: Lonni Necessary MD 04/25/2024 05:12 PM EDT RP Workstation: [id number]   DG Pelvis Portable Result Date: 04/25/2024 CLINICAL DATA:  Trauma fall EXAM: PORTABLE PELVIS 1-2 VIEWS COMPARISON:  06/23/2022 FINDINGS: Extensive vascular calcification. SI joints are non widened. Pubic symphysis and rami appear intact. Slightly limited assessment of right femoral neck due to positioning. No definitive fracture or malalignment IMPRESSION: No definite acute osseous abnormality. Electronically Signed   By: Luke Bun M.D.   On: 04/25/2024 16:29   DG Chest Port 1 View Result Date: 04/25/2024 CLINICAL DATA:  Trauma fall EXAM: PORTABLE CHEST 1 VIEW COMPARISON:  06/23/2022, chest CT 12/26/2021 FINDINGS: Post sternotomy changes. Cardiomegaly with vascular congestion. Enlarged mediastinal silhouette compared to most recent prior radiograph but probably similar compared with scout CT image from May 2023. Aortic atherosclerosis. No pneumothorax. IMPRESSION: Cardiomegaly with vascular congestion. Enlarged mediastinal silhouette compared to most recent prior radiograph, probably partially attributed to portable technique, however consider chest CT correlation to exclude acute mediastinal pathology as patient has history of known ascending aortic aneurysm on the prior CT Electronically Signed   By: Luke Bun M.D.   On: 04/25/2024 16:28    Patient Profile     68 y.o.  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   Assessment & Plan   #Elevated troponin #CAD -History of CAD  status post remote CABG in 2008 - Admitted with near syncope and mechanical fall and noted to have elevated hs troponin initially to 35>> 526>> 1443>> 1833>> 1809 - Suspect demand ischemia in the setting of severe lactic acidosis and known CAD/HOCM with rhabdomyolysis (CPK total 915 with MB 26.9 and an relative index 2.9) - Asymptomatic with no chest pain or shortness of breath - 2D echo is still pending.  Hopefully will be done today - Continue aspirin  81 mg daily, carvedilol  12.5 mg twice daily and IV heparin  drip continue - Further ischemic workup pending results of 2D echo (2D echo 2023 with EF 60 to 65% with apical HOCM/severe RV dysfunction)  #Mechanical fall #Presyncope #Rhabdomyolysis - Patient has residual left-sided weakness with chronic wheelchair-bound state from CVA and also a history of postural dizziness with near syncope - Presented to the ER with an episode of syncope followed by near syncope and a fall.  She did not have LOC.  Fall was not witnessed and she was on the floor for 4 hours. - Continues to get IV fluids - check orthostatic BPs -Ordered orthostatic blood pressures yesterday but still not done>> will speak to nursing staff  #Sepsis #Lactic acidosis  #Leukocytosis - Possible source pulmonary with procalcitonin 8 lactic acid 5.4 - Acute superior endplate compression fracture of T8 - On broad-spectrum antibiotics per [facility]  AKI - Serum creatinine 1.35 on admission - Serum creatinine improved to 1.17 today - Avoiding nephrotoxic drugs  I spent 30 minutes caring for this patient today face to face, ordering and reviewing labs, reviewing records from Chest CT 9/212/25 , seeing the patient, documenting in the record, and arranging for a 2D echo (studies)   For questions or updates, please contact [facility] Please consult [email or web address] for contact info under        Signed, Wilbert Bihari, MD  04/27/2024, 9:19 AM

## 2024-04-27 NOTE — Progress Notes (Signed)
 Orthopedic Tech Progress Note Patient Details:  Kimberly [name] 1956-01-22 [id number]  Ortho Devices Type of Ortho Device: Thoracolumbar corset (TLSO) Ortho Device/Splint Location: adjusted, at bedside Ortho Device/Splint Interventions: Ordered, Adjustment   Post Interventions Patient Tolerated: Well Instructions Provided: Care of device, Adjustment of device  Enyla Lisbon Ronal Brasil 04/27/2024, 1:19 PM

## 2024-04-27 NOTE — TOC CAGE-AID Note (Signed)
 Transition of Care [facility]) - CAGE-AID Screening  Patient Details  Name: Kimberly [name] MRN: [id number] Date of Birth: 31-Aug-1955  Clinical Narrative:  Patient denies any current alcohol or drug use, substance abuse resources not needed at this time.  CAGE-AID Screening:   Have You Ever Felt You Ought to Cut Down on Your Drinking or Drug Use?: No Have People Annoyed You By Critizing Your Drinking Or Drug Use?: No Have You Felt Bad Or Guilty About Your Drinking Or Drug Use?: No Have You Ever Had a Drink or Used Drugs First Thing In The Morning to Steady Your Nerves or to Get Rid of a Hangover?: No CAGE-AID Score: 0  Substance Abuse Education Offered: No

## 2024-04-27 NOTE — Evaluation (Signed)
 Physical Therapy Evaluation Patient Details Name: Kimberly [name] MRN: [id number] DOB: 1956-04-13 Today's Date: 04/27/2024  History of Present Illness  Kimberly [name] is a 68 y.o. female admitted 04/25/24 for near syncope and fall where she was on the ground for ~4 hours. Work-up revealed sepsis of unknown etiology, NSTEMI, and rhabdomyolysis. CT demonstrated compression fracture of T8 vertebral body of indeterminate age. 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   Clinical Impression  Pt admitted with above diagnosis. PTA, pt was modI to requiring assist with functional mobility. She primarily uses a manual w/c and reports ability to propel herself on good days. Pt uses RW to get in/out of the bathroom and transfer. She needs assist for most ADLs and all IADLs. Pt lives alone in an apartment with 2 STE. Pt reports she has a caregiver for ~2 hours everyday and her daughter visits every night.  Pt currently with functional limitations due to the deficits listed below (see PT Problem List). She required minA x2 for bed mobility, minA x2 for sit<>stand using RW, and modA x2 for bed>chair transfer using RW. Pt will benefit from acute skilled PT to maximize her independence and safety with mobility to allow discharge. Recommend continued inpatient follow up therapy, <3 hours/day.   04/27/24 1342  Vital Signs  Pulse Rate Source Monitor  BP Location Left Arm  BP Method Automatic  Patient Position (if appropriate) Orthostatic Vitals  Orthostatic Lying   BP- Lying (!) 132/108 (MAP 117)  Pulse- Lying 111  Orthostatic Sitting  BP- Sitting (!) 133/102  Pulse- Sitting 115  Orthostatic Standing at 0 minutes  BP- Standing at 0 minutes (!) 118/96 (map 104)  Pulse- Standing at 0 minutes 118  Oxygen Therapy  SpO2 92 %  O2  Device Room Air      If plan is discharge home, recommend the following: Two people to help with walking and/or transfers;Two people to help with bathing/dressing/bathroom;Assistance with cooking/housework;Assist for transportation;Help with stairs or ramp for entrance   Can travel by private vehicle   No    Equipment Recommendations Hospital bed;Hoyer lift  Recommendations for Other Services       Functional Status Assessment Patient has had a recent decline in their functional status and/or demonstrates limited ability to make significant improvements in function in a reasonable and predictable amount of time     Precautions / Restrictions Precautions Precautions: Fall Recall of Precautions/Restrictions: Impaired Precaution/Restrictions Comments: Followed back precautions d/t pain and T8 compression fx. Educated pt on log roll technique and brace. Required Braces or Orthoses: Spinal Brace Spinal Brace: Thoracolumbosacral orthotic;Applied in sitting position (only when ambulating) Restrictions Weight Bearing Restrictions Per Provider Order: No      Mobility  Bed Mobility Overal bed mobility: Needs Assistance Bed Mobility: Rolling, Sidelying to Sit Rolling: Min assist, +2 for physical assistance, Used rails Sidelying to sit: Min assist, +2 for physical assistance, HOB elevated, Used rails       General bed mobility comments: Educated pt on log roll technique. Cues for sequencing. Assist to bring LUE onto bed rail. Pt brought LE towards EOB, assist to bring legs off and elevate trunk. Used bed pad to scoot pt fwd til feet flat.    Transfers Overall transfer level: Needs assistance Equipment used: Rolling walker (2 wheels) Transfers: Sit to/from Stand, Bed to chair/wheelchair/BSC Sit to Stand:  Min assist, +2 physical assistance, From elevated surface   Step pivot transfers: Mod assist, +2 physical assistance, From elevated surface       General transfer comment: Pt  stood from slightly raised bed height. Cued proper hand placement using RW. Powered up with minA x2. She fatigued, required seated rest break. Stood again and maintained static stance while pericare was addressed. Transferred to recliner chair positioned close by on right. Fair eccentric control.    Ambulation/Gait Ambulation/Gait assistance: Mod assist, +2 physical assistance Gait Distance (Feet): 2 Feet Assistive device: Rolling walker (2 wheels) Gait Pattern/deviations: Step-to pattern, Shuffle, Knee hyperextension - left Gait velocity: decreased     General Gait Details: Pt took slow, small steps to transfer to recliner chair. She lacked foot clearence. Assist ot manuever RW and facilitate pivot turn at hips.  Stairs            Wheelchair Mobility     Tilt Bed    Modified Rankin (Stroke Patients Only)       Balance Overall balance assessment: Needs assistance Sitting-balance support: Bilateral upper extremity supported, Feet supported Sitting balance-Leahy Scale: Fair Sitting balance - Comments: Pt sat EOB with close supervision-CGA. Donned TLSO brace.   Standing balance support: Bilateral upper extremity supported, During functional activity, Reliant on assistive device for balance Standing balance-Leahy Scale: Poor Standing balance comment: Pt dependent on RW                             Pertinent Vitals/Pain Pain Assessment Pain Assessment: Faces Faces Pain Scale: Hurts even more Pain Location: back, throat Pain Descriptors / Indicators: Grimacing, Guarding, Discomfort, Aching Pain Intervention(s): Monitored during session, Limited activity within patient's tolerance, Repositioned    Home Living Family/patient expects to be discharged to:: Private residence Living Arrangements: Alone Available Help at Discharge: Personal care attendant;Family;Available PRN/intermittently (Caregiver 7 days/wk for 2 hours a day; Daughter visits every night) Type of  Home: Apartment Home Access: Stairs to enter Entrance Stairs-Rails: None Entrance Stairs-Number of Steps: 2   Home Layout: One level Home Equipment: Agricultural consultant (2 wheels);Wheelchair - manual;Shower seat      Prior Function Prior Level of Function : Needs assist       Physical Assist : ADLs (physical);Mobility (physical) Mobility (physical): Gait ADLs (physical): Bathing;Dressing;Toileting;IADLs Mobility Comments: Uses a RW to walk to the bathroom, otherwise uses a manual w/c. Transfers via stand or step pivot using RW. Pt reports she is able to propel herself in w/c on good days. 1 fall leading to current admisison. ADLs Comments: Assist with LB ADLs from daughter; assist with tub transfer; intermittent assist with toilet hygeine; sits in w/c for grooming with Mod I; daughter assists with medications using pill planner; has caregiver who assists with meal prep; daughter assists with dinners and other IADLs, including transportation     Extremity/Trunk Assessment   Upper Extremity Assessment Upper Extremity Assessment: Defer to OT evaluation    Lower Extremity Assessment Lower Extremity Assessment: Generalized weakness;LLE deficits/detail LLE Deficits / Details: Hx of hemiplegia LLE Sensation: decreased proprioception LLE Coordination: decreased gross motor    Cervical / Trunk Assessment Cervical / Trunk Assessment: Other exceptions Cervical / Trunk Exceptions: Mild endplate degenerative changes at C4-5, C5-6, and C6-7. T8 compression fracture.  Communication   Communication Communication: Impaired Factors Affecting Communication: Hearing impaired    Cognition Arousal: Alert Behavior During Therapy: WFL for tasks assessed/performed   PT - Cognitive impairments: No apparent  impairments                       PT - Cognition Comments: Pt A,Ox4` Following commands: Impaired Following commands impaired: Follows one step commands with increased time, Follows  multi-step commands inconsistently     Cueing Cueing Techniques: Verbal cues, Gestural cues     General Comments General comments (skin integrity, edema, etc.): Pt tachycardic throughout session, HR 130-140s. Orthostatics taken, (-). Pt with hypertension in supine/sitting. She reported some dizziness in standing.    Exercises     Assessment/Plan    PT Assessment Patient needs continued PT services  PT Problem List Decreased strength;Decreased range of motion;Decreased activity tolerance;Decreased balance;Decreased mobility;Decreased knowledge of precautions;Pain       PT Treatment Interventions DME instruction;Gait training;Stair training;Functional mobility training;Therapeutic activities;Therapeutic exercise;Balance training;Patient/family education;Wheelchair mobility training    PT Goals (Current goals can be found in the Care Plan section)  Acute Rehab PT Goals Patient Stated Goal: Have less pain and return home PT Goal Formulation: With patient Time For Goal Achievement: 05/11/24 Potential to Achieve Goals: Fair    Frequency Min 2X/week     Co-evaluation PT/OT/SLP Co-Evaluation/Treatment: Yes Reason for Co-Treatment: For patient/therapist safety;To address functional/ADL transfers PT goals addressed during session: Mobility/safety with mobility;Balance;Proper use of DME         AM-PAC PT 6 Clicks Mobility  Outcome Measure Help needed turning from your back to your side while in a flat bed without using bedrails?: Total Help needed moving from lying on your back to sitting on the side of a flat bed without using bedrails?: Total Help needed moving to and from a bed to a chair (including a wheelchair)?: Total Help needed standing up from a chair using your arms (e.g., wheelchair or bedside chair)?: Total Help needed to walk in hospital room?: Total Help needed climbing 3-5 steps with a railing? : Total 6 Click Score: 6    End of Session Equipment Utilized  During Treatment: Gait belt;Back brace Activity Tolerance: Patient tolerated treatment well;Patient limited by pain;Patient limited by fatigue Patient left: in chair;with call bell/phone within reach;with chair alarm set Nurse Communication: Mobility status;Other (comment) (need for bed change and new purewick) PT Visit Diagnosis: Pain;Muscle weakness (generalized) (M62.81)    Time: 8667-8590 PT Time Calculation (min) (ACUTE ONLY): 37 min   Charges:   PT Evaluation $PT Eval Moderate Complexity: 1 Mod   PT General Charges $$ ACUTE PT VISIT: 1 Visit         Randall SAUNDERS, PT, DPT Acute Rehabilitation Services Office: [phone] Secure Chat Preferred  Kimberly [name] 04/27/2024, 3:30 PM

## 2024-04-28 ENCOUNTER — Inpatient Hospital Stay (HOSPITAL_COMMUNITY)

## 2024-04-28 DIAGNOSIS — R55 Syncope and collapse: Secondary | ICD-10-CM | POA: Diagnosis not present

## 2024-04-28 LAB — AMMONIA: Ammonia: 35 umol/L (ref 9–35)

## 2024-04-28 LAB — CBC WITH DIFFERENTIAL/PLATELET
Abs Immature Granulocytes: 0.04 K/uL (ref 0.00–0.07)
Basophils Absolute: 0.1 K/uL (ref 0.0–0.1)
Basophils Relative: 1 %
Eosinophils Absolute: 0.4 K/uL (ref 0.0–0.5)
Eosinophils Relative: 5 %
HCT: 31.8 % — ABNORMAL LOW (ref 36.0–46.0)
Hemoglobin: 10 g/dL — ABNORMAL LOW (ref 12.0–15.0)
Immature Granulocytes: 1 %
Lymphocytes Relative: 28 %
Lymphs Abs: 2.2 K/uL (ref 0.7–4.0)
MCH: 22.4 pg — ABNORMAL LOW (ref 26.0–34.0)
MCHC: 31.4 g/dL (ref 30.0–36.0)
MCV: 71.1 fL — ABNORMAL LOW (ref 80.0–100.0)
Monocytes Absolute: 0.6 K/uL (ref 0.1–1.0)
Monocytes Relative: 7 %
Neutro Abs: 4.8 K/uL (ref 1.7–7.7)
Neutrophils Relative %: 58 %
Platelets: 176 K/uL (ref 150–400)
RBC: 4.47 MIL/uL (ref 3.87–5.11)
RDW: 16.9 % — ABNORMAL HIGH (ref 11.5–15.5)
WBC: 8.1 K/uL (ref 4.0–10.5)
nRBC: 0.2 % (ref 0.0–0.2)

## 2024-04-28 LAB — COMPREHENSIVE METABOLIC PANEL WITH GFR
ALT: 68 U/L — ABNORMAL HIGH (ref 0–44)
ALT: 69 U/L — ABNORMAL HIGH (ref 0–44)
AST: 77 U/L — ABNORMAL HIGH (ref 15–41)
AST: 79 U/L — ABNORMAL HIGH (ref 15–41)
Albumin: 3 g/dL — ABNORMAL LOW (ref 3.5–5.0)
Albumin: 3.1 g/dL — ABNORMAL LOW (ref 3.5–5.0)
Alkaline Phosphatase: 65 U/L (ref 38–126)
Alkaline Phosphatase: 69 U/L (ref 38–126)
Anion gap: 7 (ref 5–15)
Anion gap: 8 (ref 5–15)
BUN: 16 mg/dL (ref 8–23)
BUN: 17 mg/dL (ref 8–23)
CO2: 24 mmol/L (ref 22–32)
CO2: 24 mmol/L (ref 22–32)
Calcium: 8.7 mg/dL — ABNORMAL LOW (ref 8.9–10.3)
Calcium: 8.7 mg/dL — ABNORMAL LOW (ref 8.9–10.3)
Chloride: 111 mmol/L (ref 98–111)
Chloride: 112 mmol/L — ABNORMAL HIGH (ref 98–111)
Creatinine, Ser: 0.88 mg/dL (ref 0.44–1.00)
Creatinine, Ser: 0.89 mg/dL (ref 0.44–1.00)
GFR, Estimated: >60 mL/min (ref >60)
GFR, Estimated: >60 mL/min (ref >60)
Glucose, Bld: 92 mg/dL (ref 70–99)
Glucose, Bld: 93 mg/dL (ref 70–99)
Potassium: 3.5 mmol/L (ref 3.5–5.1)
Potassium: 3.6 mmol/L (ref 3.5–5.1)
Sodium: 143 mmol/L (ref 135–145)
Sodium: 143 mmol/L (ref 135–145)
Total Bilirubin: 0.3 mg/dL (ref 0.0–1.2)
Total Bilirubin: 0.5 mg/dL (ref 0.0–1.2)
Total Protein: 5.3 g/dL — ABNORMAL LOW (ref 6.5–8.1)
Total Protein: 5.7 g/dL — ABNORMAL LOW (ref 6.5–8.1)

## 2024-04-28 LAB — PHOSPHORUS
Phosphorus: 2.2 mg/dL — ABNORMAL LOW (ref 2.5–4.6)
Phosphorus: 2.3 mg/dL — ABNORMAL LOW (ref 2.5–4.6)

## 2024-04-28 LAB — MAGNESIUM
Magnesium: 2 mg/dL (ref 1.7–2.4)
Magnesium: 2.1 mg/dL (ref 1.7–2.4)

## 2024-04-28 LAB — PROCALCITONIN: Procalcitonin: 6.69 ng/mL

## 2024-04-28 LAB — URIC ACID: Uric Acid, Serum: 4.8 mg/dL (ref 2.5–7.1)

## 2024-04-28 LAB — C-REACTIVE PROTEIN: CRP: 0.9 mg/dL (ref <1.0)

## 2024-04-28 LAB — CK: Total CK: 989 U/L — ABNORMAL HIGH (ref 38–234)

## 2024-04-28 MED ORDER — APIXABAN 5 MG PO TABS
5.0000 mg | ORAL_TABLET | Freq: Two times a day (BID) | ORAL | Status: DC
  Administered 2024-04-28 – 2024-05-03 (×11): 5 mg via ORAL
  Filled 2024-04-28 (×11): qty 1

## 2024-04-28 MED ORDER — SODIUM PHOSPHATES 45 MMOLE/15ML IV SOLN
30.0000 mmol | Freq: Once | INTRAVENOUS | Status: AC
  Administered 2024-04-28: 30 mmol via INTRAVENOUS
  Filled 2024-04-28: qty 10

## 2024-04-28 NOTE — Progress Notes (Signed)
 Progress Note  Patient Name: Kimberly [name] Date of Encounter: 04/28/2024  Primary Cardiologist:   Lynwood Schilling, MD   Subjective   She is awake.  She has some mild SOB but denies pain.    Inpatient Medications    Scheduled Meds:  aspirin  EC  81 mg Oral Daily   carvedilol   12.5 mg Oral BID WC   docusate sodium   200 mg Oral BID   neomycin -bacitracin -polymyxin  1 Application Apply externally BID   polyethylene glycol  17 g Oral BID   umeclidinium-vilanterol  1 puff Inhalation Daily   Continuous Infusions:  azithromycin  500 mg (04/28/24 0525)   ceFEPime  (MAXIPIME ) IV 2 g (04/28/24 0925)   heparin  800 Units/hr (04/28/24 0412)   vancomycin  Stopped (04/28/24 0139)   PRN Meds: acetaminophen  **OR** acetaminophen , albuterol , ondansetron  **OR** ondansetron  (ZOFRAN ) IV, oxyCODONE    Vital Signs    Vitals:   04/27/24 1545 04/28/24 0037 04/28/24 0435 04/28/24 0801  BP: (!) 128/105 (!) 138/98 (!) 142/119   Pulse: (!) 118     Resp:      Temp: 97.8 F (36.6 C) 97.6 F (36.4 C) 97.6 F (36.4 C)   TempSrc: Oral Axillary Oral   SpO2: 93%   97%  Weight:      Height:        Intake/Output Summary (Last 24 hours) at 04/28/2024 1102 Last data filed at 04/28/2024 0438 Gross per 24 hour  Intake 910.76 ml  Output 2300 ml  Net -1389.24 ml   Filed Weights   04/25/24 1625  Weight: 56.7 kg    Telemetry    Atrial fib with controlled ventricular rate - Personally Reviewed  ECG    NA - Personally Reviewed  Physical Exam   GEN: No acute distress.   Neck: No  JVD Cardiac: Irregular RR, no murmurs, rubs, or gallops.  Respiratory: Clear  to auscultation bilaterally. GI: Soft, nontender, non-distended  MS: No  edema; No deformity. Psych: Normal affect   Labs    Chemistry Recent Labs  Lab 04/25/24 1613 04/25/24 1623 04/26/24 0337 04/27/24 1252  NA 143 145 141 142  K 3.7 3.6 4.1 3.7  CL 109 113* 113* 113*  CO2 16*  --  16* 20*  GLUCOSE 168* 166* 163* 92  BUN  28* 28* 27* 23  CREATININE 1.35* 1.20* 1.17* 1.00  CALCIUM  9.4  --  8.2* 8.7*  PROT 6.6  --  6.0* 5.8*  ALBUMIN 4.0  --  3.4* 3.0*  AST 72*  --  88* 87*  ALT 48*  --  46* 58*  ALKPHOS 69  --  60 68  BILITOT 1.4*  --  0.6 0.5  GFRNONAA 43*  --  51* >60  ANIONGAP 18*  --  12 9     Hematology Recent Labs  Lab 04/25/24 1613 04/25/24 1623 04/26/24 0155 04/27/24 1252  WBC 18.4*  --  17.4* 10.5  RBC 4.44  --  4.24 4.24  HGB 9.9* 11.2* 9.5* 9.5*  HCT 34.0* 33.0* 30.7* 30.7*  MCV 76.6*  --  72.4* 72.4*  MCH 22.3*  --  22.4* 22.4*  MCHC 29.1*  --  30.9 30.9  RDW 16.6*  --  16.5* 17.1*  PLT 181  --  182 164    Cardiac EnzymesNo results for input(s): TROPONINI in the last 168 hours. No results for input(s): TROPIPOC in the last 168 hours.   BNPNo results for input(s): BNP, PROBNP in the last 168 hours.  DDimer No results for input(s): DDIMER in the last 168 hours.   Radiology    DG Wrist 2 Views Right Result Date: 04/28/2024 CLINICAL DATA:  Pain. EXAM: RIGHT WRIST - 2 VIEW COMPARISON:  None Available. FINDINGS: There is no evidence of fracture or dislocation. The carpal rows are intact and demonstrate normal alignment. No significant arthropathy. No significant soft tissue abnormalities are seen. IMPRESSION: No acute osseous abnormality. Electronically Signed   By: Harrietta Sherry M.D.   On: 04/28/2024 10:33   DG Elbow 2 Views Right Result Date: 04/28/2024 CLINICAL DATA:  Right elbow pain. EXAM: RIGHT ELBOW - 2 VIEW COMPARISON:  None Available. FINDINGS: No appreciable acute fracture or dislocation. Borderline prominence of the anterior fat pad, which may reflect an elbow joint effusion. Soft tissue swelling along the medial elbow. IMPRESSION: Borderline prominence of the anterior fat pad may reflect an elbow joint effusion. Occult intra-articular fracture cannot be excluded. Recommend further evaluation with CT. Electronically Signed   By: Harrietta Sherry M.D.   On:  04/28/2024 10:31   DG Shoulder Right Result Date: 04/28/2024 CLINICAL DATA:  Right shoulder pain. EXAM: RIGHT SHOULDER - 2+ VIEW COMPARISON:  None Available. FINDINGS: No acute fracture or dislocation. Mild glenohumeral and acromioclavicular joint osteoarthritis. Prior median sternotomy and CABG. Soft tissues are unremarkable. IMPRESSION: 1. No acute fracture dislocation. 2. Mild glenohumeral and acromioclavicular joint osteoarthritis. Electronically Signed   By: Harrietta Sherry M.D.   On: 04/28/2024 10:24   ECHOCARDIOGRAM COMPLETE Result Date: 04/27/2024    ECHOCARDIOGRAM REPORT   Patient Name:   Kimberly [name] Date of Exam: 04/27/2024 Medical Rec #:  [id number]         Height:       67.0 in Accession #:    [id number]        Weight:       125.0 lb Date of Birth:  09-28-1955        BSA:          1.656 m Patient Age:    67 years          BP:           128/105 mmHg Patient Gender: F                 HR:           119 bpm. Exam Location:  Inpatient Procedure: 2D Echo, Cardiac Doppler and Color Doppler (Both Spectral and Color            Flow Doppler were utilized during procedure). Indications:     NSTEMI I21.4  History:         Patient has prior history of Echocardiogram examinations, most                  recent 06/23/2022. Hypertrophic Cardiomyopathy, CAD, Prior                  CABG, Stroke, COPD and CKD, stage 3, Arrythmias:Atrial                  Fibrillation, Signs/Symptoms:Dyspnea and Shortness of Breath;                  Risk Factors:Hypertension, Dyslipidemia and Current Smoker.  Sonographer:     Thea Norlander RCS Referring Phys:  CAMILA DELENA NED Diagnosing Phys: Lonni Nanas MD IMPRESSIONS  1. Left ventricular ejection fraction, by estimation, is 45 to 50%. The left ventricle has mildly decreased function. The left  ventricle demonstrates regional wall motion abnormalities (see scoring diagram/findings for description). There is mild left ventricular hypertrophy. Left ventricular  diastolic parameters are indeterminate.  2. Right ventricular systolic function is mildly reduced. The right ventricular size is normal. There is normal pulmonary artery systolic pressure. The estimated right ventricular systolic pressure is 26.8 mmHg.  3. Left atrial size was severely dilated.  4. The mitral valve is abnormal. Moderate to severe mitral valve regurgitation. Visually MR appears moderate, but may be underestimating as splay artifact is seen, though low E wave velocity argues against severe MR  5. The aortic valve was not well visualized. Aortic valve regurgitation is not visualized. No aortic stenosis is present.  6. Aortic dilatation noted. 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  7. The inferior vena cava is dilated in size with >50% respiratory variability, suggesting right atrial pressure of 8 mmHg. FINDINGS  Left Ventricle: Left ventricular ejection fraction, by estimation, is 45 to 50%. The left ventricle has mildly decreased function. The left ventricle demonstrates regional wall motion abnormalities. The left ventricular internal cavity size was normal in size. There is mild left ventricular hypertrophy. Left ventricular diastolic parameters are indeterminate.  LV Wall Scoring: The basal inferolateral segment is akinetic. The antero-lateral wall and mid inferolateral segment are hypokinetic. The entire anterior wall, entire septum, entire apex, and entire inferior wall are normal. Right Ventricle: The right ventricular size is normal. No increase in right ventricular wall thickness. Right ventricular systolic function is mildly reduced. There is normal pulmonary artery systolic pressure. The tricuspid regurgitant velocity is 2.17 m/s, and with an assumed right atrial pressure of 8 mmHg, the estimated right ventricular systolic pressure is 26.8 mmHg. Left Atrium: Left atrial size was severely dilated. Right Atrium: Right atrial size was normal in size. Pericardium: There is no  evidence of pericardial effusion. Mitral Valve: The mitral valve is abnormal. Moderate to severe mitral valve regurgitation. Tricuspid Valve: The tricuspid valve is normal in structure. Tricuspid valve regurgitation is mild. Aortic Valve: The aortic valve was not well visualized. Aortic valve regurgitation is not visualized. No aortic stenosis is present. Aortic valve peak gradient measures 1.6 mmHg. Pulmonic Valve: The pulmonic valve was not well visualized. Pulmonic valve regurgitation is trivial. Aorta: The aortic root is normal in size and structure and aortic dilatation noted. There is an aneurysm involving the ascending aorta measuring 47 mm. Venous: The inferior vena cava is dilated in size with greater than 50% respiratory variability, suggesting right atrial pressure of 8 mmHg. IAS/Shunts: The interatrial septum was not well visualized.  LEFT VENTRICLE PLAX 2D LVIDd:         3.90 cm   Diastology LVIDs:         3.10 cm   LV e' medial:    10.70 cm/s LV PW:         1.00 cm   LV E/e' medial:  8.3 LV IVS:        0.90 cm   LV e' lateral:   15.50 cm/s LVOT diam:     2.00 cm   LV E/e' lateral: 5.7 LV SV:         27 LV SV Index:   16 LVOT Area:     3.14 cm  RIGHT VENTRICLE            IVC RV S prime:     9.01 cm/s  IVC diam: 2.50 cm TAPSE (M-mode): 1.2 cm LEFT ATRIUM  Index        RIGHT ATRIUM           Index LA diam:        3.70 cm 2.23 cm/m   RA Area:     19.60 cm LA Vol (A2C):   64.5 ml 38.95 ml/m  RA Volume:   46.60 ml  28.14 ml/m LA Vol (A4C):   97.5 ml 58.87 ml/m LA Biplane Vol: 81.7 ml 49.33 ml/m  AORTIC VALVE AV Area (Vmax): 2.85 cm AV Vmax:        63.70 cm/s AV Peak Grad:   1.6 mmHg LVOT Vmax:      57.80 cm/s LVOT Vmean:     37.767 cm/s LVOT VTI:       0.086 m  AORTA Ao Root diam: 3.40 cm Ao Asc diam:  4.70 cm MITRAL VALVE               TRICUSPID VALVE MV Area (PHT): 5.88 cm    TR Peak grad:   18.8 mmHg MV Decel Time: 129 msec    TR Vmax:        217.00 cm/s MV E velocity: 89.10 cm/s                             SHUNTS                            Systemic VTI:  0.09 m                            Systemic Diam: 2.00 cm Lonni Nanas MD Electronically signed by Lonni Nanas MD Signature Date/Time: 04/27/2024/6:09:17 PM    Final (Updated)     Cardiac Studies   See above  Patient Profile     68 y.o. 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   Assessment & Plan    Elevated troponin:  History of CABG.  Suspect demand ischemia.  EF is mildly reduced with global hypokinesis.  Not an invasive evaluation candidate.  I will follow her progress and titrate meds as we are able and consider follow up work up pending repeat echo as an out patient.   Med titration is limited by labile BPs.      MR:  Moderate to severe.   This is new (was mild in 2023).   I will follow this up with repeat echo as above.        AKI:  She has had gentle hydration.  Labs pending.    Prolonged QT:  Avoid QT prolonging meds.    Supplement mag to keep above 2..   Keep potassium above 4.   Labs pending  PAF:  Currently in Fib with controlled rate.     For questions or updates, please contact [facility] Please consult [email or web address] for contact info under Cardiology/STEMI.   Signed, Lynwood Schilling, MD  04/28/2024, 11:02 AM

## 2024-04-28 NOTE — TOC Initial Note (Addendum)
 Transition of Care [facility]) - Initial/Assessment Note    Patient Details  Name: Kimberly [name] MRN: [id number] Date of Birth: 1956/01/29  [facility] CM/SW Contact:    Inocente GORMAN Kindle, LCSW Phone Number: 04/28/2024, 12:04 PM  Clinical Narrative:                 12:04 PM-CSW left voicemail for patient's sister, her main caregiver per MD. Sister assisting in decision making as she is having to encourage patient to receive workup. Per RN, sister not at bedside.  3:52 PM-CSW attempted sister again and received voicemail.   Expected Discharge Plan: Skilled Nursing Facility Barriers to Discharge: Continued Medical Work up, English as a second language teacher, SNF Pending bed offer   Patient Goals and CMS Choice Patient states their goals for this hospitalization and ongoing recovery are:: Rehab CMS Medicare.gov Compare Post Acute Care list provided to:: Patient Choice offered to / list presented to : Patient, Sibling [place] ownership interest in [facility].provided to:: Patient    Expected Discharge Plan and Services In-house Referral: Clinical Social Work   Post Acute Care Choice: Skilled Nursing Facility Living arrangements for the past 2 months: Single Family Home                                      Prior Living Arrangements/Services Living arrangements for the past 2 months: Single Family Home Lives with:: Siblings Patient language and need for interpreter reviewed:: Yes Do you feel safe going back to the place where you live?: Yes      Need for Family Participation in Patient Care: Yes (Comment) Care giver support system in place?: Yes (comment) Current home services: Homehealth aide Criminal Activity/Legal Involvement Pertinent to Current Situation/Hospitalization: No - Comment as needed  Activities of Daily Living   ADL Screening (condition at time of admission) Independently performs ADLs?: No Does the patient have a NEW difficulty with  bathing/dressing/toileting/self-feeding that is expected to last >3 days?: No Does the patient have a NEW difficulty with getting in/out of bed, walking, or climbing stairs that is expected to last >3 days?: No Does the patient have a NEW difficulty with communication that is expected to last >3 days?: No Is the patient deaf or have difficulty hearing?: No Does the patient have difficulty seeing, even when wearing glasses/contacts?: No Does the patient have difficulty concentrating, remembering, or making decisions?: No  Permission Sought/Granted Permission sought to share information with : Facility Medical sales representative, Family Supports Permission granted to share information with : Yes, Verbal Permission Granted  Share Information with NAME: Ann [name] [phone]           Emotional Assessment Appearance:: Appears stated age Attitude/Demeanor/Rapport: Engaged Affect (typically observed): Accepting Orientation: : Oriented to Self, Oriented to Place, Oriented to  Time, Oriented to Situation, Fluctuating Orientation (Suspected and/or reported Sundowners) Alcohol / Substance Use: Not Applicable Psych Involvement: No (comment)  Admission diagnosis:  Syncope and collapse [R55] Lactic acidosis [E87.20] Elevated troponin [R79.89] Near syncope [R55] Fall, initial encounter [W19.XXXA] Non-traumatic rhabdomyolysis [M62.82] Compression fracture of T8 vertebra, initial encounter (HCC) [S22.060A] Patient Active Problem List   Diagnosis Date Noted   Elevated troponin 04/26/2024   Accident due to mechanical fall without injury 04/26/2024   Non-traumatic rhabdomyolysis 04/26/2024   Lactic acidosis 04/26/2024   Near syncope 04/25/2024   Nasal congestion 04/21/2024   Hypertensive kidney disease with stage 3a chronic kidney  disease (HCC) 04/21/2024   Acquired thrombophilia (HCC) 04/21/2024   Other long term (current) drug therapy 04/02/2024   Viral URI 04/02/2024   Colon cancer  screening 10/09/2023   History of smoking 25-50 pack years 10/09/2023   Encounter for annual health examination 10/09/2023   COVID-19 vaccination declined 10/09/2023   Wheelchair dependence 10/09/2023   Dyspnea on exertion 04/11/2023   Need for influenza vaccination 04/11/2023   Hypertensive heart disease without heart failure 04/11/2023   Lower extremity edema 04/11/2023   Pulmonary hypertension, unspecified (HCC) 12/11/2022   Cerebrovascular disease 11/02/2022   Family history of colon cancer 11/02/2022   Postural dizziness with near syncope 06/23/2022   COPD (chronic obstructive pulmonary disease) (HCC) 02/09/2022   Osteoporosis 02/07/2022   Pulmonary nodule 01/25/2021   Shortness of breath 01/25/2021   Sepsis (HCC) 12/18/2020   Family history of malignant neoplasm of gastrointestinal tract 12/07/2020   History of colonic polyps 12/07/2020   Secondary hypercoagulable state (HCC) 10/14/2020   Right sided abdominal pain    Abdominal wall pain    Educated about COVID-19 virus infection 11/16/2019   Urinary frequency 04/14/2019   Cough 04/14/2019   AKI (acute kidney injury) (HCC) 11/26/2018   Diarrhea 11/25/2018   Dehydration, mild 11/25/2018   Hemiparesis affecting nondominant 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 (HCC) 07/31/2018   Muscle ache 07/31/2018   Prediabetes 07/31/2018   Chronic gout without tophus 07/31/2018   Vision loss 07/31/2018   Malnutrition of moderate degree 02/15/2018   Carotid occlusion, bilateral    Atrial fibrillation with RVR (HCC) 02/13/2018   Positive D dimer 02/13/2018   Back pain 02/13/2018   History of CVA (cerebrovascular accident) 02/13/2018   Paroxysmal atrial fibrillation (HCC) 02/13/2018   Coronary artery disease due to lipid rich plaque 02/13/2018   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HCC) 02/13/2018   Aortic atherosclerosis (HCC) 02/13/2018   TOBACCO USE, QUIT 05/03/2009    Atherosclerosis of coronary artery bypass graft(s), unspecified, with other forms of angina pectoris (HCC)    ROTATOR CUFF SYNDROME, LEFT 02/12/2009   Shoulder pain, left 11/03/2008   HAMMER TOE, ACQUIRED 02/11/2008   INSECT BITE, LEG 02/11/2008   Dyslipidemia 11/05/2007   UNSPECIFIED DISORDER TEETH&SUPPORTING STRUCTURES 11/05/2007   BICEPS TENDINITIS, RIGHT 04/24/2007   Essential hypertension 04/23/2007   MICROCYTOSIS 04/23/2007   PCP:  Georgina Speaks, FNP Pharmacy:   [facility] [place], [place] - [address] [place] [address] Phone: [phone] Fax: [phone]  [id number] - [place], [place] - [address] AT [facility] [address] [place] [address] Phone: [phone] Fax: [phone]     Social Drivers of Health (SDOH) Social History: SDOH Screenings   Food Insecurity: No Food Insecurity (04/26/2024)  Housing: Low Risk  (04/26/2024)  Transportation Needs: No Transportation Needs (04/26/2024)  Utilities: At Risk (04/26/2024)  Alcohol Screen: Low Risk  (09/21/2023)  Depression (PHQ2-9): Low Risk  (04/02/2024)  Financial Resource Strain: Low Risk  (09/21/2023)  Physical Activity: Unknown (09/21/2023)  Social Connections: Socially Isolated (04/26/2024)  Stress: No Stress Concern Present (09/21/2023)  Tobacco Use: Medium Risk (04/25/2024)  Health Literacy: Inadequate Health Literacy (09/21/2023)   SDOH Interventions:     Readmission Risk Interventions     No data to display

## 2024-04-28 NOTE — NC FL2 (Signed)
 [place]  MEDICAID FL2 LEVEL OF CARE FORM     IDENTIFICATION  Patient Name: Kimberly [name] Birthdate: Apr 30, 1956 Sex: female Admission Date (Current Location): 04/25/2024  [facility] and [place] Number:  Producer, television/film/video and Address:  [facility] [place], [address] [place], [place] [address]      Provider Number: [id number]  Attending Physician Name and Address:  Dennise Lavada POUR, MD  Relative Name and Phone Number:       Current Level of Care: Hospital Recommended Level of Care: Skilled Nursing Facility Prior Approval Number:    Date Approved/Denied:   PASRR Number: [id number]  Discharge Plan: SNF    Current Diagnoses: Patient Active Problem List   Diagnosis Date Noted   Elevated troponin 04/26/2024   Accident due to mechanical fall without injury 04/26/2024   Non-traumatic rhabdomyolysis 04/26/2024   Lactic acidosis 04/26/2024   Near syncope 04/25/2024   Nasal congestion 04/21/2024   Hypertensive kidney disease with stage 3a chronic kidney disease (HCC) 04/21/2024   Acquired thrombophilia (HCC) 04/21/2024   Other long term (current) drug therapy 04/02/2024   Viral URI 04/02/2024   Colon cancer screening 10/09/2023   History of smoking 25-50 pack years 10/09/2023   Encounter for annual health examination 10/09/2023   COVID-19 vaccination declined 10/09/2023   Wheelchair dependence 10/09/2023   Dyspnea on exertion 04/11/2023   Need for influenza vaccination 04/11/2023   Hypertensive heart disease without heart failure 04/11/2023   Lower extremity edema 04/11/2023   Pulmonary hypertension, unspecified (HCC) 12/11/2022   Cerebrovascular disease 11/02/2022   Family history of colon cancer 11/02/2022   Postural dizziness with near syncope 06/23/2022   COPD (chronic obstructive pulmonary disease) (HCC) 02/09/2022   Osteoporosis 02/07/2022   Pulmonary nodule 01/25/2021   Shortness of breath 01/25/2021   Sepsis (HCC) 12/18/2020    Family history of malignant neoplasm of gastrointestinal tract 12/07/2020   History of colonic polyps 12/07/2020   Secondary hypercoagulable state (HCC) 10/14/2020   Right sided abdominal pain    Abdominal wall pain    Educated about COVID-19 virus infection 11/16/2019   Urinary frequency 04/14/2019   Cough 04/14/2019   AKI (acute kidney injury) (HCC) 11/26/2018   Diarrhea 11/25/2018   Dehydration, mild 11/25/2018   Hemiparesis affecting nondominant 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 (HCC) 07/31/2018   Muscle ache 07/31/2018   Prediabetes 07/31/2018   Chronic gout without tophus 07/31/2018   Vision loss 07/31/2018   Malnutrition of moderate degree 02/15/2018   Carotid occlusion, bilateral    Atrial fibrillation with RVR (HCC) 02/13/2018   Positive D dimer 02/13/2018   Back pain 02/13/2018   History of CVA (cerebrovascular accident) 02/13/2018   Paroxysmal atrial fibrillation (HCC) 02/13/2018   Coronary artery disease due to lipid rich plaque 02/13/2018   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HCC) 02/13/2018   Aortic atherosclerosis (HCC) 02/13/2018   TOBACCO USE, QUIT 05/03/2009   Atherosclerosis of coronary artery bypass graft(s), unspecified, with other forms of angina pectoris (HCC)    ROTATOR CUFF SYNDROME, LEFT 02/12/2009   Shoulder pain, left 11/03/2008   HAMMER TOE, ACQUIRED 02/11/2008   INSECT BITE, LEG 02/11/2008   Dyslipidemia 11/05/2007   UNSPECIFIED DISORDER TEETH&SUPPORTING STRUCTURES 11/05/2007   BICEPS TENDINITIS, RIGHT 04/24/2007   Essential hypertension 04/23/2007   MICROCYTOSIS 04/23/2007    Orientation RESPIRATION BLADDER Height & Weight     Self, Time, Situation, Place  Normal Incontinent, External catheter Weight: 125  lb (56.7 kg) Height:  5' 7 (170.2 cm)  BEHAVIORAL SYMPTOMS/MOOD NEUROLOGICAL BOWEL NUTRITION STATUS      Continent Diet (See dc summary)  AMBULATORY STATUS COMMUNICATION  OF NEEDS Skin   Limited Assist Verbally Normal                       Personal Care Assistance Level of Assistance  Bathing, Feeding, Dressing Bathing Assistance: Limited assistance Feeding assistance: Limited assistance Dressing Assistance: Limited assistance     Functional Limitations Info  Sight Sight Info: Impaired (reading glasses)        SPECIAL CARE FACTORS FREQUENCY  PT (By licensed PT), OT (By licensed OT)     PT Frequency: 5x/week OT Frequency: 5x/week            Contractures Contractures Info: Not present    Additional Factors Info  Code Status, Allergies, Isolation Precautions Code Status Info: Full Allergies Info: Catapres -tts (Clonidine ), Aldactone  (Spironolactone )     Isolation Precautions Info: Rhinovirus + on 04/27/24     Current Medications (04/28/2024):  This is the current hospital active medication list Current Facility-Administered Medications  Medication Dose Route Frequency Provider Last Rate Last Admin   acetaminophen  (TYLENOL ) tablet 650 mg  650 mg Oral Q6H PRN Thomas, Sara-Maiz A, MD   650 mg at 04/28/24 0323   Or   acetaminophen  (TYLENOL ) suppository 650 mg  650 mg Rectal Q6H PRN Debby Camila LABOR, MD       albuterol  (PROVENTIL ) (2.5 MG/3ML) 0.083% nebulizer solution 2.5 mg  2.5 mg Inhalation Q6H PRN Thomas, Sara-Maiz A, MD       aspirin  EC tablet 81 mg  81 mg Oral Daily Thomas, Sara-Maiz A, MD   81 mg at 04/27/24 0904   azithromycin  (ZITHROMAX ) 500 mg in sodium chloride  0.9 % 250 mL IVPB  500 mg Intravenous Q24H Thomas, Sara-Maiz A, MD 250 mL/hr at 04/28/24 0525 500 mg at 04/28/24 0525   carvedilol  (COREG ) tablet 12.5 mg  12.5 mg Oral BID WC Sreeram, Narendranath, MD   12.5 mg at 04/27/24 1832   ceFEPIme  (MAXIPIME ) 2 g in sodium chloride  0.9 % 100 mL IVPB  2 g Intravenous Q12H Laron Agent, [facility]   Stopped at 04/27/24 2306   docusate sodium  (COLACE) capsule 200 mg  200 mg Oral BID Singh, Prashant K, MD   200 mg at 04/27/24 1159    heparin  ADULT infusion 100 units/mL (25000 units/250mL)  800 Units/hr Intravenous Continuous Mattie Marvetta SQUIBB, RPH 8 mL/hr at 04/28/24 0412 800 Units/hr at 04/28/24 9587   neomycin -bacitracin -polymyxin [phone] OINT 1 Application  1 Application Apply externally BID Sreeram, Narendranath, MD   1 Application at 04/27/24 2252   ondansetron  (ZOFRAN ) tablet 4 mg  4 mg Oral Q6H PRN Debby Camila LABOR, MD       Or   ondansetron  (ZOFRAN ) injection 4 mg  4 mg Intravenous Q6H PRN Debby Camila LABOR, MD       oxyCODONE  (Oxy IR/ROXICODONE ) immediate release tablet 5 mg  5 mg Oral Q6H PRN Thomas, Sara-Maiz A, MD   5 mg at 04/27/24 0349   polyethylene glycol (MIRALAX  / GLYCOLAX ) packet 17 g  17 g Oral BID Singh, Prashant K, MD   17 g at 04/27/24 1159   umeclidinium-vilanterol (ANORO ELLIPTA ) 62.5-25 MCG/ACT 1 puff  1 puff Inhalation Daily Debby Camila LABOR, MD   1 puff at 04/28/24 0801   vancomycin  (VANCOREADY) IVPB 750 mg/150 mL  750 mg Intravenous Q24H Laron Agent,  RPH   Stopped at 04/28/24 0139     Discharge Medications: Please see discharge summary for a list of discharge medications.  Relevant Imaging Results:  Relevant Lab Results:   Additional Information SSN: [id number]  Inocente RAMAN Anye Brose, LCSW

## 2024-04-28 NOTE — Plan of Care (Signed)

## 2024-04-28 NOTE — Progress Notes (Signed)
 PROGRESS NOTE                                                                                                                                                                                                             Patient Demographics:    Kimberly [name], is a 68 y.o. female, DOB - 03-06-1956, FMW:[id number]  Outpatient Primary MD for the patient is Georgina Speaks, FNP    LOS - 3  Admit date - 04/25/2024    Chief Complaint  Patient presents with   Fall       Brief Narrative (HPI from H&P)    68 y.o. 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   Patient is found to have elevated white count, tachycardia, lactic acidosis, elevated troponin. Admitted to [facility] service with cardiology evaluation.   Subjective:   Patient in bed, appears comfortable, denies any headache, no fever, no chest pain or pressure, no shortness of breath , no abdominal pain. No focal weakness.  She complains of right arm pain, refused lab draws again on 04/28/2024 despite counseling by multiple providers and patient's sister, refused x-ray of the right arm.   Assessment  & Plan :    Sepsis unknown etiology- Patient presented with tachycardia, tachypnea, lactic acidosis, elevated white count. COVID respiratory viral panel negative, HIV nonreactive, blood cultures pending. CT chest abdomen pelvis showed compression fracture T8, findings of pulmonary hypertension, interstitial chronic lung changes increased from prior exam.  Currently no diarrhea, no chest or abdominal pain no cough.  Blood cultures are pending, sepsis  pathophysiology seems to have improved, continue gentle IV fluids and antibiotics.  Advance activity and titrate down antibiotics as appropriate   Paroxysmal A-fib with RVR with elevated troponin -  Possibly demand ischemia from sepsis and atrial fibrillation.  Seen by cardiology, on paren drip, chest pain-free, now placed on Coreg  initially was on Cardizem , continue telemetry monitoring, echo shows slightly depressed EF of 45% with positive regional wall motion abnormality, will defer management to cardiology however patient unfortunately remains very noncompliant with lab draws and medications in the hospital.  Likely poor candidate for invasive intervention.   Near syncope Status post mechanical fall, T8 compression fracture & Rhabdomyolysis - Patient does have a history of dizziness. Continue telemetry rule out cardiac arrhythmias. Echocardiogram pending. Gentle IV hydration.  Trend CK. PT/ OT evaluation, out of bed to chair.   Acute kidney injury - Possibly prerenal.  Hydrate and monitor.  Refused labs morning of 04/27/2024.  Counseled.   Elevated LFTs - In the setting of sepsis and rhabdomyolysis, pain-free, CT abdomen pelvis stable, will trend.  Non compliance with medications, lab draws, radiological procedures in the hospital ongoing for several days, counseled by me, multiple nurses, nursing director, patient's sister who is the primary caregiver.      Condition - Extremely Guarded  Family Communication  : Sister Ms. [name] phone #[phone] primary caregiver for the patient, updated in detail on 04/28/2024, also informed about noncompliance, she is talking to the patient herself, had visited her on 04/27/2024 and had witnessed it firsthand according to her.  Code Status :  Full  Consults  :  Cards  PUD Prophylaxis :    Procedures  :     TTE - 1. Left ventricular ejection fraction, by estimation, is 45 to 50%. The left ventricle has mildly decreased function. The left ventricle  demonstrates regional wall motion abnormalities (see scoring diagram/findings for description). There is mild left ventricular hypertrophy. Left ventricular diastolic parameters are indeterminate.  2. Right ventricular systolic function is mildly reduced. The right ventricular size is normal. There is normal pulmonary artery systolic pressure. The estimated right ventricular systolic pressure is 26.8 mmHg.  3. Left atrial size was severely dilated.  4. The mitral valve is abnormal. Moderate to severe mitral valve regurgitation. Visually MR appears moderate, but may be underestimating as splay artifact is seen, though low E wave velocity argues against severe MR  5. The aortic valve was not well visualized. Aortic valve regurgitation is not visualized. No aortic stenosis is present.  6. Aortic dilatation noted. 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  7. The inferior vena cava is dilated in size with >50% respiratory variability, suggesting right atrial pressure of 8 mmHg.  CT head.  Nonacute.    CT chest abdomen pelvis.  Compression fracture of T8 vertebral body of indeterminate age. Please refer to same day dedicated CT. Aneurysmal dilation of ascending aorta and infrarenal abdominal aorta. Enlarged pulmonary artery which can be seen the setting of pulmonary artery hypertension. Stigmata of interstitial and chronic lung changes likely sequela from prior infection/inflammation, increased to prior exam from 2023. No suspicious pulmonary nodule.  CT C-spine.  1. No acute abnormality of the cervical spine related to the reported polytrauma and fall. 2. Mild endplate degenerative changes at C4-5, C5-6, and C6-7.  CT T-spine.   1. Acute superior endplate compression fracture of T8 with 5 mm loss of height compared to prior study, approximately 30%. No retropulsed bone is present. No other acute fractures are present.   CT L-spine. 1. No evidence of acute traumatic injury. 2. Atherosclerotic  calcifications in the abdominal aorta with infrarenal abdominal aortic aneurysm. The maximal transverse diameter of 3.0 cm. Recommend CT angio of the abdomen and pelvis in 3 years. 3. 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      Disposition Plan  :    Status is: Inpatient  DVT Prophylaxis  :  Hep gtt, SCDs    Lab Results  Component Value Date   PLT 164 04/27/2024  Diet :  Diet Order             Diet Heart Room service appropriate? Yes; Fluid consistency: Thin  Diet effective now                    Inpatient Medications  Scheduled Meds:  aspirin  EC  81 mg Oral Daily   carvedilol   12.5 mg Oral BID WC   docusate sodium   200 mg Oral BID   neomycin -bacitracin -polymyxin  1 Application Apply externally BID   polyethylene glycol  17 g Oral BID   umeclidinium-vilanterol  1 puff Inhalation Daily   Continuous Infusions:  azithromycin  500 mg (04/28/24 0525)   ceFEPime  (MAXIPIME ) IV 2 g (04/28/24 0925)   heparin  800 Units/hr (04/28/24 0412)   vancomycin  Stopped (04/28/24 0139)   PRN Meds:.acetaminophen  **OR** acetaminophen , albuterol , ondansetron  **OR** ondansetron  (ZOFRAN ) IV, oxyCODONE        Objective:   Vitals:   04/27/24 1342 04/27/24 1545 04/28/24 0037 04/28/24 0435  BP:  (!) 128/105 (!) 138/98 (!) 142/119  Pulse:  (!) 118    Resp:      Temp:  97.8 F (36.6 C) 97.6 F (36.4 C) 97.6 F (36.4 C)  TempSrc:  Oral Axillary Oral  SpO2: 92% 93%    Weight:      Height:        Wt Readings from Last 3 Encounters:  04/25/24 56.7 kg  04/02/24 59 kg  10/09/23 63.1 kg     Intake/Output Summary (Last 24 hours) at 04/28/2024 1011 Last data filed at 04/28/2024 0438 Gross per 24 hour  Intake 910.76 ml  Output 2300 ml  Net -1389.24 ml     Physical Exam  Awake Alert, No new F.N deficits, Normal affect [place].AT,PERRAL Supple Neck, No JVD,   Symmetrical Chest wall movement, Good air movement bilaterally, CTAB RRR,No Gallops,Rubs or new Murmurs,   +ve B.Sounds, Abd Soft, No tenderness,   No Cyanosis, Clubbing or edema         Data Review:    Recent Labs  Lab 04/25/24 1613 04/25/24 1623 04/26/24 0155 04/27/24 1252  WBC 18.4*  --  17.4* 10.5  HGB 9.9* 11.2* 9.5* 9.5*  HCT 34.0* 33.0* 30.7* 30.7*  PLT 181  --  182 164  MCV 76.6*  --  72.4* 72.4*  MCH 22.3*  --  22.4* 22.4*  MCHC 29.1*  --  30.9 30.9  RDW 16.6*  --  16.5* 17.1*  LYMPHSABS 1.8  --   --  2.8  MONOABS 1.0  --   --  0.8  EOSABS 0.0  --   --  0.3  BASOSABS 0.0  --   --  0.1    Recent Labs  Lab 04/25/24 1613 04/25/24 1623 04/25/24 2000 04/25/24 2309 04/26/24 0156 04/26/24 0337 04/26/24 0850 04/27/24 1252  NA 143 145  --   --   --  141  --  142  K 3.7 3.6  --   --   --  4.1  --  3.7  CL 109 113*  --   --   --  113*  --  113*  CO2 16*  --   --   --   --  16*  --  20*  ANIONGAP 18*  --   --   --   --  12  --  9  GLUCOSE 168* 166*  --   --   --  163*  --  92  BUN  28* 28*  --   --   --  27*  --  23  CREATININE 1.35* 1.20*  --   --   --  1.17*  --  1.00  AST 72*  --   --   --   --  88*  --  87*  ALT 48*  --   --   --   --  46*  --  58*  ALKPHOS 69  --   --   --   --  60  --  68  BILITOT 1.4*  --   --   --   --  0.6  --  0.5  ALBUMIN 4.0  --   --   --   --  3.4*  --  3.0*  CRP  --   --   --   --   --   --   --  1.0*  PROCALCITON  --   --  8.91  --   --   --   --  13.50  LATICACIDVEN  --  5.4*  --  3.3* 3.2*  --  2.9*  --   INR 2.6*  --   --   --   --   --   --   --   AMMONIA  --   --   --   --   --   --   --  53*  MG  --   --   --   --   --   --   --  1.5*  CALCIUM  9.4  --   --   --   --  8.2*  --  8.7*      Recent Labs  Lab 04/25/24 1613 04/25/24 1623 04/25/24 2000 04/25/24 2309 04/26/24 0156 04/26/24 0337 04/26/24 0850 04/27/24 1252  CRP  --   --   --   --   --   --   --  1.0*  PROCALCITON  --   --  8.91  --   --   --   --  13.50  LATICACIDVEN  --  5.4*  --  3.3* 3.2*  --  2.9*  --   INR 2.6*  --   --   --   --   --   --   --    AMMONIA  --   --   --   --   --   --   --  53*  MG  --   --   --   --   --   --   --  1.5*  CALCIUM  9.4  --   --   --   --  8.2*  --  8.7*    --------------------------------------------------------------------------------------------------------------- Lab Results  Component Value Date   CHOL 118 04/02/2024   HDL 52 04/02/2024   LDLCALC 48 04/02/2024   TRIG 92 04/02/2024   CHOLHDL 2.3 04/02/2024    Lab Results  Component Value Date   HGBA1C 6.2 (H) 04/02/2024   No results for input(s): TSH, T4TOTAL, FREET4, T3FREE, THYROIDAB in the last 72 hours. No results for input(s): VITAMINB12, FOLATE, FERRITIN, TIBC, IRON, RETICCTPCT in the last 72 hours. ------------------------------------------------------------------------------------------------------------------ Cardiac Enzymes Recent Labs  Lab 04/25/24 1613  CKMB 26.9*    Micro Results Recent Results (from the past 240 hours)  Culture, blood (single)     Status: None (Preliminary result)   Collection Time: 04/25/24  6:32 PM   Specimen:  BLOOD RIGHT ARM  Result Value Ref Range Status   Specimen Description BLOOD RIGHT ARM  Final   Special Requests   Final    BOTTLES DRAWN AEROBIC AND ANAEROBIC Blood Culture adequate volume   Culture   Final    NO GROWTH 3 DAYS Performed at [facility], [address] [place], [place] [address]    Report Status PENDING  Incomplete  Resp panel by RT-PCR (RSV, Flu A&B, Covid) Anterior Nasal Swab     Status: None   Collection Time: 04/25/24 11:09 PM   Specimen: Anterior Nasal Swab  Result Value Ref Range Status   SARS Coronavirus 2 by RT PCR NEGATIVE NEGATIVE Final   Influenza A by PCR NEGATIVE NEGATIVE Final   Influenza B by PCR NEGATIVE NEGATIVE Final    Comment: (NOTE) The Xpert Xpress SARS-CoV-2/FLU/RSV plus assay is intended as an aid in the diagnosis of influenza from Nasopharyngeal swab specimens and should not be used as a sole basis for treatment.  Nasal washings and aspirates are unacceptable for Xpert Xpress SARS-CoV-2/FLU/RSV testing.  Fact Sheet for Patients: BloggerCourse.com  Fact Sheet for Healthcare Providers: SeriousBroker.it  This test is not yet approved or cleared by the [place]  FDA and has been authorized for detection and/or diagnosis of SARS-CoV-2 by FDA under an Emergency Use Authorization (EUA). This EUA will remain in effect (meaning this test can be used) for the duration of the COVID-19 declaration under Section 564(b)(1) of the Act, 21 U.S.C. section 360bbb-3(b)(1), unless the authorization is terminated or revoked.     Resp Syncytial Virus by PCR NEGATIVE NEGATIVE Final    Comment: (NOTE) Fact Sheet for Patients: BloggerCourse.com  Fact Sheet for Healthcare Providers: SeriousBroker.it  This test is not yet approved or cleared by the [place]  FDA and has been authorized for detection and/or diagnosis of SARS-CoV-2 by FDA under an Emergency Use Authorization (EUA). This EUA will remain in effect (meaning this test can be used) for the duration of the COVID-19 declaration under Section 564(b)(1) of the Act, 21 U.S.C. section 360bbb-3(b)(1), unless the authorization is terminated or revoked.  Performed at [facility], [address], [place], [place] [address]   Respiratory (~20 pathogens) panel by PCR     Status: Abnormal   Collection Time: 04/27/24  7:04 AM   Specimen: Nasopharyngeal Swab; Respiratory  Result Value Ref Range Status   Adenovirus NOT DETECTED NOT DETECTED Final   Coronavirus 229E NOT DETECTED NOT DETECTED Final    Comment: (NOTE) The Coronavirus on the Respiratory Panel, DOES NOT test for the novel  Coronavirus (2019 nCoV)    Coronavirus HKU1 NOT DETECTED NOT DETECTED Final   Coronavirus NL63 NOT DETECTED NOT DETECTED Final   Coronavirus OC43 NOT DETECTED NOT  DETECTED Final   Metapneumovirus NOT DETECTED NOT DETECTED Final   Rhinovirus / Enterovirus DETECTED (A) NOT DETECTED Final   Influenza A NOT DETECTED NOT DETECTED Final   Influenza B NOT DETECTED NOT DETECTED Final   Parainfluenza Virus 1 NOT DETECTED NOT DETECTED Final   Parainfluenza Virus 2 NOT DETECTED NOT DETECTED Final   Parainfluenza Virus 3 NOT DETECTED NOT DETECTED Final   Parainfluenza Virus 4 NOT DETECTED NOT DETECTED Final   Respiratory Syncytial Virus NOT DETECTED NOT DETECTED Final   Bordetella pertussis NOT DETECTED NOT DETECTED Final   Bordetella Parapertussis NOT DETECTED NOT DETECTED Final   Chlamydophila pneumoniae NOT DETECTED NOT DETECTED Final   Mycoplasma pneumoniae NOT DETECTED NOT DETECTED Final  Comment: Performed at [facility], [address] [place], [place] [address]  MRSA Next Gen by PCR, Nasal     Status: None   Collection Time: 04/27/24 10:15 AM   Specimen: Nasal Mucosa; Nasal Swab  Result Value Ref Range Status   MRSA by PCR Next Gen NOT DETECTED NOT DETECTED Final    Comment: (NOTE) The GeneXpert MRSA Assay (FDA approved for NASAL specimens only), is one component of a comprehensive MRSA colonization surveillance program. It is not intended to diagnose MRSA infection nor to guide or monitor treatment for MRSA infections. Test performance is not FDA approved in patients less than 21 years old. Performed at [facility], [address] [place], [place] [address]     Radiology Report ECHOCARDIOGRAM COMPLETE Result Date: 04/27/2024    ECHOCARDIOGRAM REPORT   Patient Name:   Kimberly [name] Date of Exam: 04/27/2024 Medical Rec #:  [id number]         Height:       67.0 in Accession #:    [id number]        Weight:       125.0 lb Date of Birth:  09-Jul-1956        BSA:          1.656 m Patient Age:    67 years          BP:           128/105 mmHg Patient Gender: F                 HR:           119 bpm. Exam Location:  Inpatient Procedure:  2D Echo, Cardiac Doppler and Color Doppler (Both Spectral and Color            Flow Doppler were utilized during procedure). Indications:     NSTEMI I21.4  History:         Patient has prior history of Echocardiogram examinations, most                  recent 06/23/2022. Hypertrophic Cardiomyopathy, CAD, Prior                  CABG, Stroke, COPD and CKD, stage 3, Arrythmias:Atrial                  Fibrillation, Signs/Symptoms:Dyspnea and Shortness of Breath;                  Risk Factors:Hypertension, Dyslipidemia and Current Smoker.  Sonographer:     Thea Norlander RCS Referring Phys:  CAMILA DELENA NED Diagnosing Phys: Lonni Nanas MD IMPRESSIONS  1. Left ventricular ejection fraction, by estimation, is 45 to 50%. The left ventricle has mildly decreased function. The left ventricle demonstrates regional wall motion abnormalities (see scoring diagram/findings for description). There is mild left ventricular hypertrophy. Left ventricular diastolic parameters are indeterminate.  2. Right ventricular systolic function is mildly reduced. The right ventricular size is normal. There is normal pulmonary artery systolic pressure. The estimated right ventricular systolic pressure is 26.8 mmHg.  3. Left atrial size was severely dilated.  4. The mitral valve is abnormal. Moderate to severe mitral valve regurgitation. Visually MR appears moderate, but may be underestimating as splay artifact is seen, though low E wave velocity argues against severe MR  5. The aortic valve was not well visualized. Aortic valve regurgitation is not visualized. No aortic stenosis is present.  6. Aortic  dilatation noted. 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  7. The inferior vena cava is dilated in size with >50% respiratory variability, suggesting right atrial pressure of 8 mmHg. FINDINGS  Left Ventricle: Left ventricular ejection fraction, by estimation, is 45 to 50%. The left ventricle has mildly decreased function. The  left ventricle demonstrates regional wall motion abnormalities. The left ventricular internal cavity size was normal in size. There is mild left ventricular hypertrophy. Left ventricular diastolic parameters are indeterminate.  LV Wall Scoring: The basal inferolateral segment is akinetic. The antero-lateral wall and mid inferolateral segment are hypokinetic. The entire anterior wall, entire septum, entire apex, and entire inferior wall are normal. Right Ventricle: The right ventricular size is normal. No increase in right ventricular wall thickness. Right ventricular systolic function is mildly reduced. There is normal pulmonary artery systolic pressure. The tricuspid regurgitant velocity is 2.17 m/s, and with an assumed right atrial pressure of 8 mmHg, the estimated right ventricular systolic pressure is 26.8 mmHg. Left Atrium: Left atrial size was severely dilated. Right Atrium: Right atrial size was normal in size. Pericardium: There is no evidence of pericardial effusion. Mitral Valve: The mitral valve is abnormal. Moderate to severe mitral valve regurgitation. Tricuspid Valve: The tricuspid valve is normal in structure. Tricuspid valve regurgitation is mild. Aortic Valve: The aortic valve was not well visualized. Aortic valve regurgitation is not visualized. No aortic stenosis is present. Aortic valve peak gradient measures 1.6 mmHg. Pulmonic Valve: The pulmonic valve was not well visualized. Pulmonic valve regurgitation is trivial. Aorta: The aortic root is normal in size and structure and aortic dilatation noted. There is an aneurysm involving the ascending aorta measuring 47 mm. Venous: The inferior vena cava is dilated in size with greater than 50% respiratory variability, suggesting right atrial pressure of 8 mmHg. IAS/Shunts: The interatrial septum was not well visualized.  LEFT VENTRICLE PLAX 2D LVIDd:         3.90 cm   Diastology LVIDs:         3.10 cm   LV e' medial:    10.70 cm/s LV PW:         1.00  cm   LV E/e' medial:  8.3 LV IVS:        0.90 cm   LV e' lateral:   15.50 cm/s LVOT diam:     2.00 cm   LV E/e' lateral: 5.7 LV SV:         27 LV SV Index:   16 LVOT Area:     3.14 cm  RIGHT VENTRICLE            IVC RV S prime:     9.01 cm/s  IVC diam: 2.50 cm TAPSE (M-mode): 1.2 cm LEFT ATRIUM             Index        RIGHT ATRIUM           Index LA diam:        3.70 cm 2.23 cm/m   RA Area:     19.60 cm LA Vol (A2C):   64.5 ml 38.95 ml/m  RA Volume:   46.60 ml  28.14 ml/m LA Vol (A4C):   97.5 ml 58.87 ml/m LA Biplane Vol: 81.7 ml 49.33 ml/m  AORTIC VALVE AV Area (Vmax): 2.85 cm AV Vmax:        63.70 cm/s AV Peak Grad:   1.6 mmHg LVOT Vmax:      57.80 cm/s LVOT  Vmean:     37.767 cm/s LVOT VTI:       0.086 m  AORTA Ao Root diam: 3.40 cm Ao Asc diam:  4.70 cm MITRAL VALVE               TRICUSPID VALVE MV Area (PHT): 5.88 cm    TR Peak grad:   18.8 mmHg MV Decel Time: 129 msec    TR Vmax:        217.00 cm/s MV E velocity: 89.10 cm/s                            SHUNTS                            Systemic VTI:  0.09 m                            Systemic Diam: 2.00 cm Lonni Nanas MD Electronically signed by Lonni Nanas MD Signature Date/Time: 04/27/2024/6:09:17 PM    Final (Updated)      Signature  -   Lavada Stank M.D on 04/28/2024 at 10:11 AM   -  To page go to [email or web address]

## 2024-04-29 DIAGNOSIS — R55 Syncope and collapse: Secondary | ICD-10-CM | POA: Diagnosis not present

## 2024-04-29 LAB — CBC WITH DIFFERENTIAL/PLATELET
Abs Immature Granulocytes: 0.08 K/uL — ABNORMAL HIGH (ref 0.00–0.07)
Basophils Absolute: 0.1 K/uL (ref 0.0–0.1)
Basophils Relative: 1 %
Eosinophils Absolute: 0.3 K/uL (ref 0.0–0.5)
Eosinophils Relative: 4 %
HCT: 32.6 % — ABNORMAL LOW (ref 36.0–46.0)
Hemoglobin: 10.2 g/dL — ABNORMAL LOW (ref 12.0–15.0)
Immature Granulocytes: 1 %
Lymphocytes Relative: 26 %
Lymphs Abs: 1.9 K/uL (ref 0.7–4.0)
MCH: 22.5 pg — ABNORMAL LOW (ref 26.0–34.0)
MCHC: 31.3 g/dL (ref 30.0–36.0)
MCV: 72 fL — ABNORMAL LOW (ref 80.0–100.0)
Monocytes Absolute: 0.7 K/uL (ref 0.1–1.0)
Monocytes Relative: 10 %
Neutro Abs: 4.4 K/uL (ref 1.7–7.7)
Neutrophils Relative %: 58 %
Platelets: 227 K/uL (ref 150–400)
RBC: 4.53 MIL/uL (ref 3.87–5.11)
RDW: 17.1 % — ABNORMAL HIGH (ref 11.5–15.5)
Smear Review: NORMAL
WBC: 7.5 K/uL (ref 4.0–10.5)
nRBC: 0 % (ref 0.0–0.2)

## 2024-04-29 LAB — COMPREHENSIVE METABOLIC PANEL WITH GFR
ALT: 65 U/L — ABNORMAL HIGH (ref 0–44)
AST: 58 U/L — ABNORMAL HIGH (ref 15–41)
Albumin: 3.3 g/dL — ABNORMAL LOW (ref 3.5–5.0)
Alkaline Phosphatase: 70 U/L (ref 38–126)
Anion gap: 11 (ref 5–15)
BUN: 16 mg/dL (ref 8–23)
CO2: 22 mmol/L (ref 22–32)
Calcium: 9 mg/dL (ref 8.9–10.3)
Chloride: 108 mmol/L (ref 98–111)
Creatinine, Ser: 0.78 mg/dL (ref 0.44–1.00)
GFR, Estimated: >60 mL/min (ref >60)
Glucose, Bld: 73 mg/dL (ref 70–99)
Potassium: 4.7 mmol/L (ref 3.5–5.1)
Sodium: 141 mmol/L (ref 135–145)
Total Bilirubin: 0.8 mg/dL (ref 0.0–1.2)
Total Protein: 6.4 g/dL — ABNORMAL LOW (ref 6.5–8.1)

## 2024-04-29 LAB — C-REACTIVE PROTEIN: CRP: 1 mg/dL — ABNORMAL HIGH (ref <1.0)

## 2024-04-29 LAB — PHOSPHORUS: Phosphorus: 3.4 mg/dL (ref 2.5–4.6)

## 2024-04-29 LAB — PROCALCITONIN: Procalcitonin: 4.3 ng/mL

## 2024-04-29 LAB — CK: Total CK: 683 U/L — ABNORMAL HIGH (ref 38–234)

## 2024-04-29 LAB — MAGNESIUM: Magnesium: 1.9 mg/dL (ref 1.7–2.4)

## 2024-04-29 MED ORDER — AZITHROMYCIN 500 MG PO TABS
500.0000 mg | ORAL_TABLET | Freq: Once | ORAL | Status: AC
  Administered 2024-04-30: 500 mg via ORAL
  Filled 2024-04-29: qty 1

## 2024-04-29 MED ORDER — POTASSIUM CHLORIDE CRYS ER 20 MEQ PO TBCR
40.0000 meq | EXTENDED_RELEASE_TABLET | Freq: Once | ORAL | Status: AC
  Administered 2024-04-29: 40 meq via ORAL
  Filled 2024-04-29: qty 2

## 2024-04-29 NOTE — Progress Notes (Signed)
 PROGRESS NOTE                                                                                                                                                                                                             Patient Demographics:    Kimberly [name], is a 68 y.o. female, DOB - 01/04/1956, FMW:[id number]  Outpatient Primary MD for the patient is Georgina Speaks, FNP    LOS - 4  Admit date - 04/25/2024    Chief Complaint  Patient presents with   Fall       Brief Narrative (HPI from H&P)    68 y.o. 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   Patient is found to have elevated white count, tachycardia, lactic acidosis, elevated troponin. Admitted to [facility] service with cardiology evaluation.   Subjective:   Patient in bed, appears comfortable, denies any headache, no fever, no chest pain or pressure, no shortness of breath , no abdominal pain. No focal weakness.  She complains of right arm pain, refused lab draws again on 04/28/2024 despite counseling by multiple providers and patient's sister, refused x-ray of the right arm.   Assessment  & Plan :    Sepsis unknown etiology- Patient presented with tachycardia, tachypnea, lactic acidosis, elevated white count. COVID respiratory viral panel negative, HIV nonreactive, blood cultures pending thus far negative. CT chest abdomen pelvis showed compression fracture T8, findings of pulmonary hypertension, interstitial chronic lung changes increased from prior exam.  Currently no diarrhea, no chest or abdominal pain no cough.    Blood cultures are thus far  negative, MRSA nasal PCR negative, sepsis pathophysiology seems to have improved, patient mild URI/bronchitis, taper down antibiotics and discontinue vancomycin  on 04/29/2024, complete 5 days of azithromycin  and cefepime  and follow cultures.   Paroxysmal A-fib with RVR with elevated troponin, chronic systolic heart failure EF 45% with regional wall motion abnormality and severe MR-troponin rise possibly demand ischemia from sepsis and atrial fibrillation.  Seen by cardiology, was initially on heparin  drip now on Eliquis , chest pain-free, now placed on Coreg  initially was on Cardizem , continue  telemetry monitoring, echo shows slightly depressed EF of 45% with positive regional wall motion abnormality along with severe MR , will defer management to cardiology however patient unfortunately remains very noncompliant with lab draws and medications in the hospital.  Likely poor candidate for invasive intervention.   Near syncope Status post mechanical fall, T8 compression fracture & Rhabdomyolysis - Patient does have a history of dizziness. Continue telemetry rule out cardiac arrhythmias. Echocardiogram as above.  She is s/p gentle IV hydration.  Trend CK. PT/ OT evaluation, out of bed to chair.  TLSO brace ordered for the T8 compression fracture, CT findings discussed with Dr. Joshua.  No surgical intervention with outpatient follow-up with neurosurgery if desired.   Acute kidney injury -due to dehydration resolved..   Chronic right arm pain.  We have imaged right shoulder, right elbow and right wrist.  Stable.  Patient states this pain has been ongoing for several months.  Could be neuropathic, outpatient follow-up with PCP.    Elevated LFTs - In the setting of sepsis and rhabdomyolysis, pain-free, CT abdomen pelvis stable, will trend.   Non compliance with medications, lab draws, radiological procedures in the hospital ongoing for several days, counseled by me, multiple nurses, nursing director, patient's  sister who is the primary caregiver.  Unfortunately has refused labs again morning of 04/29/2024 despite counseling      Condition - Extremely Guarded  Family Communication  : Sister Ms. [name] phone #[phone] primary caregiver for the patient, updated in detail on 04/28/2024, also informed about noncompliance, she is talking to the patient herself, had visited her on 04/27/2024 and had witnessed it firsthand according to her.  Code Status :  Full  Consults  :  Cards  PUD Prophylaxis :    Procedures  :     TTE - 1. Left ventricular ejection fraction, by estimation, is 45 to 50%. The left ventricle has mildly decreased function. The left ventricle demonstrates regional wall motion abnormalities (see scoring diagram/findings for description). There is mild left ventricular hypertrophy. Left ventricular diastolic parameters are indeterminate.  2. Right ventricular systolic function is mildly reduced. The right ventricular size is normal. There is normal pulmonary artery systolic pressure. The estimated right ventricular systolic pressure is 26.8 mmHg.  3. Left atrial size was severely dilated.  4. The mitral valve is abnormal. Moderate to severe mitral valve regurgitation. Visually MR appears moderate, but may be underestimating as splay artifact is seen, though low E wave velocity argues against severe MR  5. The aortic valve was not well visualized. Aortic valve regurgitation is not visualized. No aortic stenosis is present.  6. Aortic dilatation noted. 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  7. The inferior vena cava is dilated in size with >50% respiratory variability, suggesting right atrial pressure of 8 mmHg.  CT head.  Nonacute.    CT chest abdomen pelvis.  Compression fracture of T8 vertebral body of indeterminate age. Please refer to same day dedicated CT. Aneurysmal dilation of ascending aorta and infrarenal abdominal aorta. Enlarged pulmonary artery which can be seen the setting  of pulmonary artery hypertension. Stigmata of interstitial and chronic lung changes likely sequela from prior infection/inflammation, increased to prior exam from 2023. No suspicious pulmonary nodule.  CT C-spine.  1. No acute abnormality of the cervical spine related to the reported polytrauma and fall. 2. Mild endplate degenerative changes at C4-5, C5-6, and C6-7.  CT T-spine.   1. Acute superior endplate compression fracture of T8 with 5  mm loss of height compared to prior study, approximately 30%. No retropulsed bone is present. No other acute fractures are present.   CT L-spine. 1. No evidence of acute traumatic injury. 2. Atherosclerotic calcifications in the abdominal aorta with infrarenal abdominal aortic aneurysm. The maximal transverse diameter of 3.0 cm. Recommend CT angio of the abdomen and pelvis in 3 years. 3. 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      Disposition Plan  :    Status is: Inpatient  DVT Prophylaxis  :  Hep gtt, SCDs    Lab Results  Component Value Date   PLT 176 04/28/2024    Diet :  Diet Order             Diet Heart Room service appropriate? Yes; Fluid consistency: Thin  Diet effective now                    Inpatient Medications  Scheduled Meds:  apixaban   5 mg Oral BID   aspirin  EC  81 mg Oral Daily   carvedilol   12.5 mg Oral BID WC   docusate sodium   200 mg Oral BID   neomycin -bacitracin -polymyxin  1 Application Apply externally BID   polyethylene glycol  17 g Oral BID   umeclidinium-vilanterol  1 puff Inhalation Daily   Continuous Infusions:  azithromycin  500 mg (04/29/24 0519)   ceFEPime  (MAXIPIME ) IV 2 g (04/28/24 2028)   PRN Meds:.acetaminophen  **OR** acetaminophen , albuterol , ondansetron  **OR** ondansetron  (ZOFRAN ) IV, oxyCODONE        Objective:   Vitals:   04/28/24 1640 04/28/24 2000 04/29/24 0000 04/29/24 0400  BP: (!) 133/97     Pulse: (!) 115     Resp: 16     Temp:  97.8 F (36.6 C) 98.7 F (37.1  C) 98 F (36.7 C)  TempSrc:  Oral Oral Oral  SpO2: 91%     Weight:      Height:        Wt Readings from Last 3 Encounters:  04/25/24 56.7 kg  04/02/24 59 kg  10/09/23 63.1 kg     Intake/Output Summary (Last 24 hours) at 04/29/2024 0803 Last data filed at 04/28/2024 1209 Gross per 24 hour  Intake 200 ml  Output 300 ml  Net -100 ml     Physical Exam  Awake oriented x 2, No new F.N deficits, Normal affect [place].AT,PERRAL Supple Neck, No JVD,   Symmetrical Chest wall movement, Good air movement bilaterally, CTAB RRR,No Gallops,Rubs or new Murmurs,  +ve B.Sounds, Abd Soft, No tenderness,   No Cyanosis, Clubbing or edema         Data Review:    Recent Labs  Lab 04/25/24 1613 04/25/24 1623 04/26/24 0155 04/27/24 1252 04/28/24 1124  WBC 18.4*  --  17.4* 10.5 8.1  HGB 9.9* 11.2* 9.5* 9.5* 10.0*  HCT 34.0* 33.0* 30.7* 30.7* 31.8*  PLT 181  --  182 164 176  MCV 76.6*  --  72.4* 72.4* 71.1*  MCH 22.3*  --  22.4* 22.4* 22.4*  MCHC 29.1*  --  30.9 30.9 31.4  RDW 16.6*  --  16.5* 17.1* 16.9*  LYMPHSABS 1.8  --   --  2.8 2.2  MONOABS 1.0  --   --  0.8 0.6  EOSABS 0.0  --   --  0.3 0.4  BASOSABS 0.0  --   --  0.1 0.1    Recent Labs  Lab 04/25/24 1613 04/25/24 1623 04/25/24 2000 04/25/24 2309 04/26/24  9843 04/26/24 0337 04/26/24 0850 04/27/24 1252 04/28/24 1124  NA 143 145  --   --   --  141  --  142 143  143  K 3.7 3.6  --   --   --  4.1  --  3.7 3.6  3.5  CL 109 113*  --   --   --  113*  --  113* 112*  111  CO2 16*  --   --   --   --  16*  --  20* 24  24  ANIONGAP 18*  --   --   --   --  12  --  9 7  8   GLUCOSE 168* 166*  --   --   --  163*  --  92 92  93  BUN 28* 28*  --   --   --  27*  --  23 17  16   CREATININE 1.35* 1.20*  --   --   --  1.17*  --  1.00 0.89  0.88  AST 72*  --   --   --   --  88*  --  87* 79*  77*  ALT 48*  --   --   --   --  46*  --  58* 68*  69*  ALKPHOS 69  --   --   --   --  60  --  68 69  65  BILITOT 1.4*  --   --   --    --  0.6  --  0.5 0.5  0.3  ALBUMIN 4.0  --   --   --   --  3.4*  --  3.0* 3.0*  3.1*  CRP  --   --   --   --   --   --   --  1.0* 0.9  PROCALCITON  --   --  8.91  --   --   --   --  13.50 6.69  LATICACIDVEN  --  5.4*  --  3.3* 3.2*  --  2.9*  --   --   INR 2.6*  --   --   --   --   --   --   --   --   AMMONIA  --   --   --   --   --   --   --  53* 35  MG  --   --   --   --   --   --   --  1.5* 2.0  2.1  PHOS  --   --   --   --   --   --   --   --  2.3*  2.2*  CALCIUM  9.4  --   --   --   --  8.2*  --  8.7* 8.7*  8.7*      Recent Labs  Lab 04/25/24 1613 04/25/24 1623 04/25/24 2000 04/25/24 2309 04/26/24 0156 04/26/24 0337 04/26/24 0850 04/27/24 1252 04/28/24 1124  CRP  --   --   --   --   --   --   --  1.0* 0.9  PROCALCITON  --   --  8.91  --   --   --   --  13.50 6.69  LATICACIDVEN  --  5.4*  --  3.3* 3.2*  --  2.9*  --   --   INR 2.6*  --   --   --   --   --   --   --   --  AMMONIA  --   --   --   --   --   --   --  53* 35  MG  --   --   --   --   --   --   --  1.5* 2.0  2.1  CALCIUM  9.4  --   --   --   --  8.2*  --  8.7* 8.7*  8.7*    --------------------------------------------------------------------------------------------------------------- Lab Results  Component Value Date   CHOL 118 04/02/2024   HDL 52 04/02/2024   LDLCALC 48 04/02/2024   TRIG 92 04/02/2024   CHOLHDL 2.3 04/02/2024    Lab Results  Component Value Date   HGBA1C 6.2 (H) 04/02/2024   No results for input(s): TSH, T4TOTAL, FREET4, T3FREE, THYROIDAB in the last 72 hours. No results for input(s): VITAMINB12, FOLATE, FERRITIN, TIBC, IRON, RETICCTPCT in the last 72 hours. ------------------------------------------------------------------------------------------------------------------ Cardiac Enzymes Recent Labs  Lab 04/25/24 1613  CKMB 26.9*    Micro Results Recent Results (from the past 240 hours)  Culture, blood (single)     Status: None (Preliminary result)    Collection Time: 04/25/24  6:32 PM   Specimen: BLOOD RIGHT ARM  Result Value Ref Range Status   Specimen Description BLOOD RIGHT ARM  Final   Special Requests   Final    BOTTLES DRAWN AEROBIC AND ANAEROBIC Blood Culture adequate volume   Culture   Final    NO GROWTH 3 DAYS Performed at [facility], [address] [place], [place] [address]    Report Status PENDING  Incomplete  Resp panel by RT-PCR (RSV, Flu A&B, Covid) Anterior Nasal Swab     Status: None   Collection Time: 04/25/24 11:09 PM   Specimen: Anterior Nasal Swab  Result Value Ref Range Status   SARS Coronavirus 2 by RT PCR NEGATIVE NEGATIVE Final   Influenza A by PCR NEGATIVE NEGATIVE Final   Influenza B by PCR NEGATIVE NEGATIVE Final    Comment: (NOTE) The Xpert Xpress SARS-CoV-2/FLU/RSV plus assay is intended as an aid in the diagnosis of influenza from Nasopharyngeal swab specimens and should not be used as a sole basis for treatment. Nasal washings and aspirates are unacceptable for Xpert Xpress SARS-CoV-2/FLU/RSV testing.  Fact Sheet for Patients: BloggerCourse.com  Fact Sheet for Healthcare Providers: SeriousBroker.it  This test is not yet approved or cleared by the [place]  FDA and has been authorized for detection and/or diagnosis of SARS-CoV-2 by FDA under an Emergency Use Authorization (EUA). This EUA will remain in effect (meaning this test can be used) for the duration of the COVID-19 declaration under Section 564(b)(1) of the Act, 21 U.S.C. section 360bbb-3(b)(1), unless the authorization is terminated or revoked.     Resp Syncytial Virus by PCR NEGATIVE NEGATIVE Final    Comment: (NOTE) Fact Sheet for Patients: BloggerCourse.com  Fact Sheet for Healthcare Providers: SeriousBroker.it  This test is not yet approved or cleared by the [place]  FDA and has been authorized for  detection and/or diagnosis of SARS-CoV-2 by FDA under an Emergency Use Authorization (EUA). This EUA will remain in effect (meaning this test can be used) for the duration of the COVID-19 declaration under Section 564(b)(1) of the Act, 21 U.S.C. section 360bbb-3(b)(1), unless the authorization is terminated or revoked.  Performed at [facility], [address], [place], [place] [address]   Respiratory (~20 pathogens) panel by PCR     Status: Abnormal   Collection Time: 04/27/24  7:04 AM   Specimen: Nasopharyngeal Swab; Respiratory  Result Value Ref Range Status   Adenovirus NOT DETECTED NOT DETECTED Final   Coronavirus 229E NOT DETECTED NOT DETECTED Final    Comment: (NOTE) The Coronavirus on the Respiratory Panel, DOES NOT test for the novel  Coronavirus (2019 nCoV)    Coronavirus HKU1 NOT DETECTED NOT DETECTED Final   Coronavirus NL63 NOT DETECTED NOT DETECTED Final   Coronavirus OC43 NOT DETECTED NOT DETECTED Final   Metapneumovirus NOT DETECTED NOT DETECTED Final   Rhinovirus / Enterovirus DETECTED (A) NOT DETECTED Final   Influenza A NOT DETECTED NOT DETECTED Final   Influenza B NOT DETECTED NOT DETECTED Final   Parainfluenza Virus 1 NOT DETECTED NOT DETECTED Final   Parainfluenza Virus 2 NOT DETECTED NOT DETECTED Final   Parainfluenza Virus 3 NOT DETECTED NOT DETECTED Final   Parainfluenza Virus 4 NOT DETECTED NOT DETECTED Final   Respiratory Syncytial Virus NOT DETECTED NOT DETECTED Final   Bordetella pertussis NOT DETECTED NOT DETECTED Final   Bordetella Parapertussis NOT DETECTED NOT DETECTED Final   Chlamydophila pneumoniae NOT DETECTED NOT DETECTED Final   Mycoplasma pneumoniae NOT DETECTED NOT DETECTED Final    Comment: Performed at [facility], [address], [place], [place] [address]  MRSA Next Gen by PCR, Nasal     Status: None   Collection Time: 04/27/24 10:15 AM   Specimen: Nasal Mucosa; Nasal Swab  Result Value Ref Range Status   MRSA by PCR  Next Gen NOT DETECTED NOT DETECTED Final    Comment: (NOTE) The GeneXpert MRSA Assay (FDA approved for NASAL specimens only), is one component of a comprehensive MRSA colonization surveillance program. It is not intended to diagnose MRSA infection nor to guide or monitor treatment for MRSA infections. Test performance is not FDA approved in patients less than 7 years old. Performed at [facility], [address] [place], [place] [address]     Radiology Report CT ELBOW RIGHT WO CONTRAST Result Date: 04/28/2024 CLINICAL DATA:  Elbow pain, chronic, bone abnormality suspected, xray done EXAM: CT OF THE UPPER RIGHT EXTREMITY WITHOUT CONTRAST TECHNIQUE: Multidetector CT imaging of the right elbow was performed according to the standard protocol. RADIATION DOSE REDUCTION: This exam was performed according to the departmental dose-optimization program which includes automated exposure control, adjustment of the mA and/or kV according to patient size and/or use of iterative reconstruction technique. COMPARISON:  Radiographs 04/28/2024 FINDINGS: Bones/Joint/Cartilage No evidence of acute fracture or dislocation. Minimal spurring of the coronoid process. Minimal joint effusion. No evidence of intra-articular loose body. Ligaments Suboptimally assessed by CT. Muscles and Tendons The elbow muscles and tendons appear unremarkable as evaluated by CT. Soft tissues IV tubing present in the antecubital fossa. Apparent mild subcutaneous edema posterolaterally. No evidence of focal fluid collection, other foreign body or soft tissue emphysema. IMPRESSION: 1. No acute osseous findings or significant arthropathic changes. 2. Minimal joint effusion. 3. Mild subcutaneous edema posterolaterally. No evidence of focal fluid collection, other foreign body or soft tissue emphysema. Electronically Signed   By: Elsie Perone M.D.   On: 04/28/2024 19:00   DG Wrist 2 Views Right Result Date: 04/28/2024 CLINICAL DATA:   Pain. EXAM: RIGHT WRIST - 2 VIEW COMPARISON:  None Available. FINDINGS: There is no evidence of fracture or dislocation. The carpal rows are intact and demonstrate normal alignment. No significant arthropathy. No significant soft tissue abnormalities are seen. IMPRESSION: No acute osseous abnormality. Electronically Signed   By: Harrietta  Lateef M.D.   On: 04/28/2024 10:33   DG Elbow 2 Views Right Result Date: 04/28/2024 CLINICAL DATA:  Right elbow pain. EXAM: RIGHT ELBOW - 2 VIEW COMPARISON:  None Available. FINDINGS: No appreciable acute fracture or dislocation. Borderline prominence of the anterior fat pad, which may reflect an elbow joint effusion. Soft tissue swelling along the medial elbow. IMPRESSION: Borderline prominence of the anterior fat pad may reflect an elbow joint effusion. Occult intra-articular fracture cannot be excluded. Recommend further evaluation with CT. Electronically Signed   By: Harrietta Sherry M.D.   On: 04/28/2024 10:31   DG Shoulder Right Result Date: 04/28/2024 CLINICAL DATA:  Right shoulder pain. EXAM: RIGHT SHOULDER - 2+ VIEW COMPARISON:  None Available. FINDINGS: No acute fracture or dislocation. Mild glenohumeral and acromioclavicular joint osteoarthritis. Prior median sternotomy and CABG. Soft tissues are unremarkable. IMPRESSION: 1. No acute fracture dislocation. 2. Mild glenohumeral and acromioclavicular joint osteoarthritis. Electronically Signed   By: Harrietta Sherry M.D.   On: 04/28/2024 10:24   ECHOCARDIOGRAM COMPLETE Result Date: 04/27/2024    ECHOCARDIOGRAM REPORT   Patient Name:   Kimberly [name] Date of Exam: 04/27/2024 Medical Rec #:  [id number]         Height:       67.0 in Accession #:    [id number]        Weight:       125.0 lb Date of Birth:  [birth year]        BSA:          1.656 m Patient Age:    67 years          BP:           128/105 mmHg Patient Gender: F                 HR:           119 bpm. Exam Location:  Inpatient Procedure: 2D Echo, Cardiac  Doppler and Color Doppler (Both Spectral and Color            Flow Doppler were utilized during procedure). Indications:     NSTEMI I21.4  History:         Patient has prior history of Echocardiogram examinations, most                  recent 06/23/2022. Hypertrophic Cardiomyopathy, CAD, Prior                  CABG, Stroke, COPD and CKD, stage 3, Arrythmias:Atrial                  Fibrillation, Signs/Symptoms:Dyspnea and Shortness of Breath;                  Risk Factors:Hypertension, Dyslipidemia and Current Smoker.  Sonographer:     Thea Norlander RCS Referring Phys:  CAMILA DELENA NED Diagnosing Phys: Lonni Nanas MD IMPRESSIONS  1. Left ventricular ejection fraction, by estimation, is 45 to 50%. The left ventricle has mildly decreased function. The left ventricle demonstrates regional wall motion abnormalities (see scoring diagram/findings for description). There is mild left ventricular hypertrophy. Left ventricular diastolic parameters are indeterminate.  2. Right ventricular systolic function is mildly reduced. The right ventricular size is normal. There is normal pulmonary artery systolic pressure. The estimated right ventricular systolic pressure is 26.8 mmHg.  3. Left atrial size was severely dilated.  4. The mitral valve is abnormal. Moderate to severe mitral valve regurgitation. Visually MR appears moderate,  but may be underestimating as splay artifact is seen, though low E wave velocity argues against severe MR  5. The aortic valve was not well visualized. Aortic valve regurgitation is not visualized. No aortic stenosis is present.  6. Aortic dilatation noted. 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  7. The inferior vena cava is dilated in size with >50% respiratory variability, suggesting right atrial pressure of 8 mmHg. FINDINGS  Left Ventricle: Left ventricular ejection fraction, by estimation, is 45 to 50%. The left ventricle has mildly decreased function. The left ventricle  demonstrates regional wall motion abnormalities. The left ventricular internal cavity size was normal in size. There is mild left ventricular hypertrophy. Left ventricular diastolic parameters are indeterminate.  LV Wall Scoring: The basal inferolateral segment is akinetic. The antero-lateral wall and mid inferolateral segment are hypokinetic. The entire anterior wall, entire septum, entire apex, and entire inferior wall are normal. Right Ventricle: The right ventricular size is normal. No increase in right ventricular wall thickness. Right ventricular systolic function is mildly reduced. There is normal pulmonary artery systolic pressure. The tricuspid regurgitant velocity is 2.17 m/s, and with an assumed right atrial pressure of 8 mmHg, the estimated right ventricular systolic pressure is 26.8 mmHg. Left Atrium: Left atrial size was severely dilated. Right Atrium: Right atrial size was normal in size. Pericardium: There is no evidence of pericardial effusion. Mitral Valve: The mitral valve is abnormal. Moderate to severe mitral valve regurgitation. Tricuspid Valve: The tricuspid valve is normal in structure. Tricuspid valve regurgitation is mild. Aortic Valve: The aortic valve was not well visualized. Aortic valve regurgitation is not visualized. No aortic stenosis is present. Aortic valve peak gradient measures 1.6 mmHg. Pulmonic Valve: The pulmonic valve was not well visualized. Pulmonic valve regurgitation is trivial. Aorta: The aortic root is normal in size and structure and aortic dilatation noted. There is an aneurysm involving the ascending aorta measuring 47 mm. Venous: The inferior vena cava is dilated in size with greater than 50% respiratory variability, suggesting right atrial pressure of 8 mmHg. IAS/Shunts: The interatrial septum was not well visualized.  LEFT VENTRICLE PLAX 2D LVIDd:         3.90 cm   Diastology LVIDs:         3.10 cm   LV e' medial:    10.70 cm/s LV PW:         1.00 cm   LV E/e'  medial:  8.3 LV IVS:        0.90 cm   LV e' lateral:   15.50 cm/s LVOT diam:     2.00 cm   LV E/e' lateral: 5.7 LV SV:         27 LV SV Index:   16 LVOT Area:     3.14 cm  RIGHT VENTRICLE            IVC RV S prime:     9.01 cm/s  IVC diam: 2.50 cm TAPSE (M-mode): 1.2 cm LEFT ATRIUM             Index        RIGHT ATRIUM           Index LA diam:        3.70 cm 2.23 cm/m   RA Area:     19.60 cm LA Vol (A2C):   64.5 ml 38.95 ml/m  RA Volume:   46.60 ml  28.14 ml/m LA Vol (A4C):   97.5 ml 58.87 ml/m LA Biplane  Vol: 81.7 ml 49.33 ml/m  AORTIC VALVE AV Area (Vmax): 2.85 cm AV Vmax:        63.70 cm/s AV Peak Grad:   1.6 mmHg LVOT Vmax:      57.80 cm/s LVOT Vmean:     37.767 cm/s LVOT VTI:       0.086 m  AORTA Ao Root diam: 3.40 cm Ao Asc diam:  4.70 cm MITRAL VALVE               TRICUSPID VALVE MV Area (PHT): 5.88 cm    TR Peak grad:   18.8 mmHg MV Decel Time: 129 msec    TR Vmax:        217.00 cm/s MV E velocity: 89.10 cm/s                            SHUNTS                            Systemic VTI:  0.09 m                            Systemic Diam: 2.00 cm Lonni Nanas MD Electronically signed by Lonni Nanas MD Signature Date/Time: 04/27/2024/6:09:17 PM    Final (Updated)      Signature  -   Lavada Stank M.D on 04/29/2024 at 8:03 AM   -  To page go to [email or web address]

## 2024-04-29 NOTE — TOC Progression Note (Addendum)
 Transition of Care [facility]) - Progression Note    Patient Details  Name: Kimberly [name] MRN: [id number] Date of Birth: 1955-11-07  Transition of Care [facility]) CM/SW Contact  Inocente GORMAN Kindle, LCSW Phone Number: 04/29/2024, 8:36 AM  Clinical Narrative:    8:39 AM-CSW contacted patient's sister. She confirmed that patient has about 2.5 hours of aide a day. She is on the CAPS waitlist but it has been 10 years. Sister calls monthly to check status and stated they were only able to get an increase of an hour when they completed the expedited need form due to the organization feeling like if patient requires more hours then she needs to be placed in a facility long term, which family is not willing to do. CSW discussed SNF recommendation with Kimberly [name]. She stated for CSW to discuss with patient and if patient is willing to go, then that is fine, but only for a week or two as they had trouble getting the last SNF she stayed at to release her. CSW discussed insurance authorization process and will provide Medicare SNF ratings list. Kimberly [name] stated if patient wants to return home then family is fine with having home health set up. CSW did relay to [place] that patient refused lab draws again. She stated she will try to call the patient.   12:14 PM-CSW met with patient and discussed plan. Patient requested to do a week or two of rehab at [facility]. CSW faxed out referral and requested [facility] review.   12:19 PM-Per Leonidas they will not likely have beds available this week. CSW left voicemail for patient's sister to see if she has another preference.   Skilled Nursing Rehab Facilities-   [email or web address]   Ratings out of 5 stars (5 the highest)  Name Address  Phone # Quality Care Staffing Health Inspection Overall  [facility] [address] [phone] 2 1 2 1   [facility] [address], [place] [phone] 5 2 4 5   [facility] [address], [facility] [phone] 2 1 1 1   [facility] [address] [place] [phone] 4 3 4 4   [facility]  [address], [place] [phone] [facility] [address], [facility] [phone] 5 3 2 3   [facility] [address] [place] [phone] 5 1 2 2   [facility] [address] [place] [phone] 3 4 4 4   [address] ([facility]) [address], [place] [phone] 1 3 3 2   [facility] [address] [place], [place] [phone] 4 2 2 2   [facility] [address], [place] [phone] 2 1 1 1   Clotilda Pereyra [address] [place] [phone] 2 4 4 4   [facility] [address], [place] [phone] 3 1 3 2   [facility] ([facility]) [address], [place] [phone] 1 2 4 3           [facility] [address] [place] [phone] 3 1 5 4   [facility] [address] [place] [phone] 4 2 1 1   [facility]  [address] [place] [phone] 2 4 1 1   [facility] [place] [address] [phone] 2 2 5 5   [facility] [address], [place] [phone] 2 2 3 3           [facility] [address] [place] [phone] 2 1 2 1   [facility]  No [facility]) [address] [place] [phone] 4 3 4 4   [facility] [address], [facility] [phone] 3  5 5   [facility] [address] [place] [phone] 4 2 1 1   [facility] [address] [place] [phone] 3 1 2 1   [facility] [address], [place] [phone] 1 3 3 2   [facility] [address], [place] [phone] 2 2 3 3   [facility] [address] [place], [place] [phone] 2 1 4 3   [facility] [address], [facility] [phone] 2 1 1 1   [facility] [address] [place], [place]  [phone] 2 1 2 1   [facility] [address] [place] [phone] 3 1 2 1           [facility] [address], [place] [phone] 4 1 3 2   [place] [address], [place]  [phone] 2 4 4 4   [facility] (No [employer]) [address], [place] [phone] 3 2 5 5   [facility]) [address], [place] [phone] 3 2 3 3   [facility] [address] [place] [phone] 5 3 5 5   [facility] [address] [place], [place] [phone] 2 1 1 1   [facility] [address] [place], [place] [phone] [facility] [address] [place], [place] [phone] 5 4 5 5   [facility]  [address], [place] [phone] 1 1 2 1   [facility] [address], [place] [phone]  2 4 4   [facility] [address] [place] [phone] 3 5 5 5   [address] [place], [place] [phone] 4 2 2 2   [facility]) [address] [place] [phone] 1 1 3 1   [address] [place] [phone] 2 2 2 2       Expected Discharge Plan: Skilled Nursing Facility Barriers to Discharge: Continued Medical Work up, English as a second language teacher, SNF Pending bed offer               Expected Discharge Plan and Services In-house Referral: Clinical Social Work   Post Acute Care Choice: Skilled Nursing Facility Living arrangements for the past 2 months: Single Family Home                                       Social Drivers of Health (SDOH) Interventions SDOH Screenings   Food Insecurity: No Food Insecurity (04/26/2024)  Housing: Low Risk  (04/26/2024)  Transportation Needs: No Transportation Needs (04/26/2024)  Utilities: At Risk (04/26/2024)  Alcohol Screen: Low Risk  (09/21/2023)  Depression (PHQ2-9): Low Risk  (04/02/2024)  Financial Resource Strain: Low Risk  (09/21/2023)  Physical  Activity: Unknown (09/21/2023)  Social Connections: Socially Isolated (04/26/2024)  Stress: No Stress Concern Present (09/21/2023)  Tobacco Use: Medium Risk (04/25/2024)  Health Literacy: Inadequate Health Literacy (09/21/2023)    Readmission Risk Interventions     No data to display

## 2024-04-29 NOTE — Progress Notes (Signed)
 Physical Therapy Treatment Patient Details Name: Kimberly [name] MRN: [id number] DOB: [birth year] Today's Date: 04/29/2024   History of Present Illness Kimberly [name] is a 68 y.o. female admitted 04/25/24 for near syncope and fall where she was on the ground for ~4 hours. Work-up revealed sepsis of unknown etiology, NSTEMI, and rhabdomyolysis. CT demonstrated compression fracture of T8 vertebral body of indeterminate age. 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    PT Comments  Pt with fair tolerance to treatment today. Pt today was able to sit EOB with +2 Mod A however further mobility was limited by pain and dizziness. BP: 150/120 after attempting both arms. No change in DC/DME recs at this time. PT will continue to follow.     If plan is discharge home, recommend the following: Two people to help with walking and/or transfers;Two people to help with bathing/dressing/bathroom;Assistance with cooking/housework;Assist for transportation;Help with stairs or ramp for entrance   Can travel by private vehicle     No  Equipment Recommendations  Hospital bed;Hoyer lift    Recommendations for Other Services       Precautions / Restrictions Precautions Precautions: Fall Recall of Precautions/Restrictions: Impaired Precaution/Restrictions Comments: Followed back precautions d/t pain and T8 compression fx. Educated pt on log roll technique and brace. Required Braces or Orthoses: Spinal Brace Spinal Brace: Thoracolumbosacral orthotic;Applied in sitting position Restrictions Weight Bearing Restrictions Per Provider Order: No     Mobility  Bed Mobility Overal bed mobility: Needs Assistance Bed Mobility: Supine to Sit, Sit to Supine Rolling: Min assist, +2 for physical assistance, Used rails   Supine to sit: +2  for physical assistance, Mod assist Sit to supine: +2 for physical assistance, Mod assist   General bed mobility comments: +2 Mod A to sit EOB via helicopter method. Pt very fixated on her back itching.    Transfers                   General transfer comment: Deferred due to pain and dizziness.    Ambulation/Gait                   Stairs             Wheelchair Mobility     Tilt Bed    Modified Rankin (Stroke Patients Only)       Balance Overall balance assessment: Needs assistance Sitting-balance support: Bilateral upper extremity supported, Feet supported Sitting balance-Leahy Scale: Fair Sitting balance - Comments: Pt sat EOB with close supervision-CGA.                                    Communication Communication Communication: Impaired Factors Affecting Communication: Hearing impaired  Cognition Arousal: Alert Behavior During Therapy: WFL for tasks assessed/performed   PT - Cognitive impairments: No family/caregiver present to determine baseline                         Following commands: Impaired Following commands impaired: Follows one step commands with increased time, Follows multi-step commands inconsistently    Cueing Cueing Techniques: Verbal cues, Gestural cues  Exercises      General Comments General comments (skin integrity, edema, etc.): BP: 150/120      Pertinent Vitals/Pain Pain Assessment Pain  Assessment: Faces Faces Pain Scale: Hurts even more Pain Location: All over Pain Descriptors / Indicators: Grimacing, Guarding, Discomfort, Aching, Moaning Pain Intervention(s): Limited activity within patient's tolerance, Monitored during session, Repositioned    Home Living                          Prior Function            PT Goals (current goals can now be found in the care plan section) Progress towards PT goals: Progressing toward goals    Frequency    Min  2X/week      PT Plan      Co-evaluation              AM-PAC PT 6 Clicks Mobility   Outcome Measure  Help needed turning from your back to your side while in a flat bed without using bedrails?: Total Help needed moving from lying on your back to sitting on the side of a flat bed without using bedrails?: Total Help needed moving to and from a bed to a chair (including a wheelchair)?: Total Help needed standing up from a chair using your arms (e.g., wheelchair or bedside chair)?: Total Help needed to walk in hospital room?: Total Help needed climbing 3-5 steps with a railing? : Total 6 Click Score: 6    End of Session   Activity Tolerance: Treatment limited secondary to medical complications (Comment);Patient limited by pain;Patient limited by fatigue Patient left: in bed;with call bell/phone within reach;with bed alarm set Nurse Communication: Mobility status PT Visit Diagnosis: Pain;Muscle weakness (generalized) (M62.81)     Time: 1550-1606 PT Time Calculation (min) (ACUTE ONLY): 16 min  Charges:    $Therapeutic Activity: 8-22 mins PT General Charges $$ ACUTE PT VISIT: 1 Visit                     Kimberly [name], PT, DPT Acute Rehab Services [phone]    Erina Hamme 04/29/2024, 5:17 PM

## 2024-04-29 NOTE — Plan of Care (Signed)

## 2024-04-30 DIAGNOSIS — I340 Nonrheumatic mitral (valve) insufficiency: Secondary | ICD-10-CM

## 2024-04-30 DIAGNOSIS — R55 Syncope and collapse: Secondary | ICD-10-CM | POA: Diagnosis not present

## 2024-04-30 LAB — CULTURE, BLOOD (SINGLE)
Culture: NO GROWTH
Special Requests: ADEQUATE

## 2024-04-30 LAB — COMPREHENSIVE METABOLIC PANEL WITH GFR
ALT: 51 U/L — ABNORMAL HIGH (ref 0–44)
AST: 40 U/L (ref 15–41)
Albumin: 2.9 g/dL — ABNORMAL LOW (ref 3.5–5.0)
Alkaline Phosphatase: 66 U/L (ref 38–126)
Anion gap: 12 (ref 5–15)
BUN: 12 mg/dL (ref 8–23)
CO2: 23 mmol/L (ref 22–32)
Calcium: 9 mg/dL (ref 8.9–10.3)
Chloride: 108 mmol/L (ref 98–111)
Creatinine, Ser: 0.89 mg/dL (ref 0.44–1.00)
GFR, Estimated: >60 mL/min (ref >60)
Glucose, Bld: 81 mg/dL (ref 70–99)
Potassium: 4.6 mmol/L (ref 3.5–5.1)
Sodium: 143 mmol/L (ref 135–145)
Total Bilirubin: 0.6 mg/dL (ref 0.0–1.2)
Total Protein: 5.7 g/dL — ABNORMAL LOW (ref 6.5–8.1)

## 2024-04-30 LAB — CBC WITH DIFFERENTIAL/PLATELET
Abs Immature Granulocytes: 0.06 K/uL (ref 0.00–0.07)
Basophils Absolute: 0.1 K/uL (ref 0.0–0.1)
Basophils Relative: 1 %
Eosinophils Absolute: 0.4 K/uL (ref 0.0–0.5)
Eosinophils Relative: 5 %
HCT: 32.2 % — ABNORMAL LOW (ref 36.0–46.0)
Hemoglobin: 10.2 g/dL — ABNORMAL LOW (ref 12.0–15.0)
Immature Granulocytes: 1 %
Lymphocytes Relative: 26 %
Lymphs Abs: 1.9 K/uL (ref 0.7–4.0)
MCH: 22.5 pg — ABNORMAL LOW (ref 26.0–34.0)
MCHC: 31.7 g/dL (ref 30.0–36.0)
MCV: 70.9 fL — ABNORMAL LOW (ref 80.0–100.0)
Monocytes Absolute: 0.8 K/uL (ref 0.1–1.0)
Monocytes Relative: 11 %
Neutro Abs: 4.2 K/uL (ref 1.7–7.7)
Neutrophils Relative %: 56 %
Platelets: 179 K/uL (ref 150–400)
RBC: 4.54 MIL/uL (ref 3.87–5.11)
RDW: 17 % — ABNORMAL HIGH (ref 11.5–15.5)
WBC: 7.4 K/uL (ref 4.0–10.5)
nRBC: 0 % (ref 0.0–0.2)

## 2024-04-30 LAB — PROCALCITONIN: Procalcitonin: 2.45 ng/mL

## 2024-04-30 LAB — PHOSPHORUS: Phosphorus: 3.1 mg/dL (ref 2.5–4.6)

## 2024-04-30 LAB — PATHOLOGIST SMEAR REVIEW

## 2024-04-30 LAB — CK: Total CK: 352 U/L — ABNORMAL HIGH (ref 38–234)

## 2024-04-30 LAB — C-REACTIVE PROTEIN: CRP: 0.8 mg/dL (ref <1.0)

## 2024-04-30 LAB — MAGNESIUM: Magnesium: 1.8 mg/dL (ref 1.7–2.4)

## 2024-04-30 MED ORDER — MAGNESIUM SULFATE IN D5W 1-5 GM/100ML-% IV SOLN
1.0000 g | Freq: Once | INTRAVENOUS | Status: AC
  Administered 2024-04-30: 1 g via INTRAVENOUS
  Filled 2024-04-30 (×2): qty 100

## 2024-04-30 MED ORDER — CARVEDILOL 25 MG PO TABS
25.0000 mg | ORAL_TABLET | Freq: Two times a day (BID) | ORAL | Status: DC
  Administered 2024-04-30 – 2024-05-03 (×6): 25 mg via ORAL
  Filled 2024-04-30 (×6): qty 1

## 2024-04-30 NOTE — Progress Notes (Signed)
 Progress Note  Patient Name: Kimberly [name] Date of Encounter: 04/30/2024  Primary Cardiologist:   Lynwood Schilling, MD   Subjective   She denies chest pain and has had some SOB.  Dizzy when working with PT.   Inpatient Medications    Scheduled Meds:  apixaban   5 mg Oral BID   aspirin  EC  81 mg Oral Daily   carvedilol   12.5 mg Oral BID WC   docusate sodium   200 mg Oral BID   neomycin -bacitracin -polymyxin  1 Application Apply externally BID   polyethylene glycol  17 g Oral BID   umeclidinium-vilanterol  1 puff Inhalation Daily   Continuous Infusions:  ceFEPime  (MAXIPIME ) IV 2 g (04/30/24 0934)   PRN Meds: acetaminophen  **OR** acetaminophen , albuterol , ondansetron  **OR** ondansetron  (ZOFRAN ) IV, oxyCODONE    Vital Signs    Vitals:   04/29/24 1713 04/29/24 2000 04/30/24 0740 04/30/24 0928  BP: (!) 139/99  (!) 151/100 (!) 155/95  Pulse: 97  83 (!) 107  Resp:   14   Temp:  97.8 F (36.6 C) 99 F (37.2 C)   TempSrc:  Oral Oral   SpO2:   92%   Weight:      Height:        Intake/Output Summary (Last 24 hours) at 04/30/2024 0945 Last data filed at 04/30/2024 0751 Gross per 24 hour  Intake --  Output 950 ml  Net -950 ml   Filed Weights   04/25/24 1625  Weight: 56.7 kg    Telemetry    Atrial flutter with variable conduction, NSVT.  - Personally Reviewed  ECG    Atrial flutter with variable conduction. QT prolonged and T wave inversion in the inferolateral leads new since previous.  - Personally Reviewed  Physical Exam   GEN: No  acute distress.   Neck: No  JVD Cardiac: Irregular RR, no murmurs, rubs, or gallops.  Respiratory: Clear   to auscultation bilaterally. GI: Soft, nontender, non-distended, normal bowel sounds  MS:  No edema; No deformity. Psych: Oriented and appropriate    Labs    Chemistry Recent Labs  Lab 04/28/24 1124 04/29/24 0835 04/30/24 0821  NA 143  143 141 143  K 3.6  3.5 4.7 4.6  CL 112*  111 108 108  CO2 24  24 22  23   GLUCOSE 92  93 73 81  BUN 17  16 16 12   CREATININE 0.89  0.88 0.78 0.89  CALCIUM  8.7*  8.7* 9.0 9.0  PROT 5.3*  5.7* 6.4* 5.7*  ALBUMIN 3.0*  3.1* 3.3* 2.9*  AST 79*  77* 58* 40  ALT 68*  69* 65* 51*  ALKPHOS 69  65 70 66  BILITOT 0.5  0.3 0.8 0.6  GFRNONAA >60  >60 >60 >60  ANIONGAP 7  8 11 12      Hematology Recent Labs  Lab 04/28/24 1124 04/29/24 1431 04/30/24 0821  WBC 8.1 7.5 7.4  RBC 4.47 4.53 4.54  HGB 10.0* 10.2* 10.2*  HCT 31.8* 32.6* 32.2*  MCV 71.1* 72.0* 70.9*  MCH 22.4* 22.5* 22.5*  MCHC 31.4 31.3 31.7  RDW 16.9* 17.1* 17.0*  PLT 176 227 179    Cardiac EnzymesNo results for input(s): TROPONINI in the last 168 hours. No results for input(s): TROPIPOC in the last 168 hours.   BNPNo results for input(s): BNP, PROBNP in the last 168 hours.   DDimer No results for input(s): DDIMER in the last 168 hours.   Radiology    CT ELBOW  RIGHT WO CONTRAST Result Date: 04/28/2024 CLINICAL DATA:  Elbow pain, chronic, bone abnormality suspected, xray done EXAM: CT OF THE UPPER RIGHT EXTREMITY WITHOUT CONTRAST TECHNIQUE: Multidetector CT imaging of the right elbow was performed according to the standard protocol. RADIATION DOSE REDUCTION: This exam was performed according to the departmental dose-optimization program which includes automated exposure control, adjustment of the mA and/or kV according to patient size and/or use of iterative reconstruction technique. COMPARISON:  Radiographs 04/28/2024 FINDINGS: Bones/Joint/Cartilage No evidence of acute fracture or dislocation. Minimal spurring of the coronoid process. Minimal joint effusion. No evidence of intra-articular loose body. Ligaments Suboptimally assessed by CT. Muscles and Tendons The elbow muscles and tendons appear unremarkable as evaluated by CT. Soft tissues IV tubing present in the antecubital fossa. Apparent mild subcutaneous edema posterolaterally. No evidence of focal fluid collection,  other foreign body or soft tissue emphysema. IMPRESSION: 1. No acute osseous findings or significant arthropathic changes. 2. Minimal joint effusion. 3. Mild subcutaneous edema posterolaterally. No evidence of focal fluid collection, other foreign body or soft tissue emphysema. Electronically Signed   By: Elsie Perone M.D.   On: 04/28/2024 19:00   DG Wrist 2 Views Right Result Date: 04/28/2024 CLINICAL DATA:  Pain. EXAM: RIGHT WRIST - 2 VIEW COMPARISON:  None Available. FINDINGS: There is no evidence of fracture or dislocation. The carpal rows are intact and demonstrate normal alignment. No significant arthropathy. No significant soft tissue abnormalities are seen. IMPRESSION: No acute osseous abnormality. Electronically Signed   By: Harrietta Sherry M.D.   On: 04/28/2024 10:33   DG Elbow 2 Views Right Result Date: 04/28/2024 CLINICAL DATA:  Right elbow pain. EXAM: RIGHT ELBOW - 2 VIEW COMPARISON:  None Available. FINDINGS: No appreciable acute fracture or dislocation. Borderline prominence of the anterior fat pad, which may reflect an elbow joint effusion. Soft tissue swelling along the medial elbow. IMPRESSION: Borderline prominence of the anterior fat pad may reflect an elbow joint effusion. Occult intra-articular fracture cannot be excluded. Recommend further evaluation with CT. Electronically Signed   By: Harrietta Sherry M.D.   On: 04/28/2024 10:31   DG Shoulder Right Result Date: 04/28/2024 CLINICAL DATA:  Right shoulder pain. EXAM: RIGHT SHOULDER - 2+ VIEW COMPARISON:  None Available. FINDINGS: No acute fracture or dislocation. Mild glenohumeral and acromioclavicular joint osteoarthritis. Prior median sternotomy and CABG. Soft tissues are unremarkable. IMPRESSION: 1. No acute fracture dislocation. 2. Mild glenohumeral and acromioclavicular joint osteoarthritis. Electronically Signed   By: Harrietta Sherry M.D.   On: 04/28/2024 10:24    Cardiac Studies   See above  Patient Profile     68  y.o. 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   Assessment & Plan    Elevated troponin:  History of CABG.  Suspect demand ischemia.  EF is mildly reduced with global hypokinesis.  Not an invasive evaluation candidate.  She does have T wave inversion and newly reduced EF with regional wall motion consistent with previous infarct as identified on previous perfusion study.  My plan however, given her recent sepsis and multiple comorbidities and absence of acute chest pain is medical management and follow up as an out patient of her echo and EKGs.  Increase beta blocker  MR:  Moderate to severe.   This is new (was mild in 2023).   I will follow this up with repeat echo as above.    She will need a TEE in the future after she is over acute events.   AKI:   Creat is improved.   Prolonged QT:  Avoid QT prolonging meds.    Supplement mag to keep above 2.  Repeat EKG   PAF:  Seems to be sustaining atrial fib/tach and I will follow up with an out patient monitor at discharge.   For questions or updates, please contact [facility] Please consult [email or web address] for contact info under Cardiology/STEMI.   Signed, Lynwood Schilling, MD  04/30/2024, 9:45 AM

## 2024-04-30 NOTE — Progress Notes (Signed)
 PROGRESS NOTE                                                                                                                                                                                                             Patient Demographics:    Kimberly [name], is a 68 y.o. female, DOB - Aug 11, 1956, FMW:[id number]  Outpatient Primary MD for the patient is Georgina Speaks, FNP    LOS - 5  Admit date - 04/25/2024    Chief Complaint  Patient presents with   Fall       Brief Narrative (HPI from H&P)      68 y.o. 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   Patient is found to have elevated white count, tachycardia, lactic acidosis, elevated troponin. Admitted to [facility] service with cardiology evaluation.   Subjective:   Patient in bed, she appears comfortable, she is complaining of pain all over, she complaining of pain on the right arm, upon further questioning she is complaining of pain on all over her body.   Assessment  & Plan :    Sepsis due to acute bronchitis (likely bacterial) - Patient presented with tachycardia, tachypnea, lactic acidosis, elevated white count. COVID respiratory viral panel negative, HIV nonreactive, blood cultures pending thus far negative. CT chest abdomen pelvis showed compression fracture T8, findings of pulmonary hypertension, interstitial chronic lung changes increased from prior exam.  Currently no diarrhea, no chest or abdominal pain no cough.   -  patient mild URI/bronchitis, taper down antibiotics and discontinue vancomycin  on 04/29/2024, complete 5 days of azithromycin   and cefepime  and follow cultures.   Paroxysmal A-fib with RVR with elevated troponin, chronic systolic heart failure EF 45% with regional wall motion abnormality and severe MR Elevated troponins Severe MR -Management per cardiology -troponin rise possibly demand ischemia from sepsis and atrial fibrillation.  -Seen by cardiology, was initially on heparin  drip now on Eliquis . -now placed on Coreg  initially was on Cardizem , continue telemetry monitoring, echo shows slightly depressed EF of 45% with positive regional wall motion abnormality along with severe MR  Near syncope Status post mechanical fall, T8 compression fracture & Rhabdomyolysis - Patient does have a history of dizziness. Continue telemetry rule out cardiac arrhythmias. Echocardiogram as above.  She is s/p gentle IV hydration.  Trend CK. PT/ OT evaluation, out of bed to chair.  TLSO brace ordered for the T8 compression fracture, CT findings discussed with Dr. Joshua.  No surgical intervention with outpatient follow-up with neurosurgery if desired.    Acute kidney injury -due to dehydration resolved..   Chronic right arm pain.  We have imaged right shoulder, right elbow and right wrist.  Stable.  Patient states this pain has been ongoing for several months.  Could be neuropathic, outpatient follow-up with PCP.    Elevated LFTs - In the setting of sepsis and rhabdomyolysis, pain-free, CT abdomen pelvis stable, will trend.  Right upper ext pain - x-rays with no acute finding      Condition - Extremely Guarded  Family Communication  : Sister Ms. [name] phone #[phone] primary caregiver for the patient, updated in detail on 04/28/2024, also informed about noncompliance, she is talking to the patient herself, had visited her on 04/27/2024 and had witnessed it firsthand according to her.  Code Status :  Full  Consults  :  Cards  PUD Prophylaxis :    Procedures  :     TTE - 1. Left ventricular ejection fraction, by estimation,  is 45 to 50%. The left ventricle has mildly decreased function. The left ventricle demonstrates regional wall motion abnormalities (see scoring diagram/findings for description). There is mild left ventricular hypertrophy. Left ventricular diastolic parameters are indeterminate.  2. Right ventricular systolic function is mildly reduced. The right ventricular size is normal. There is normal pulmonary artery systolic pressure. The estimated right ventricular systolic pressure is 26.8 mmHg.  3. Left atrial size was severely dilated.  4. The mitral valve is abnormal. Moderate to severe mitral valve regurgitation. Visually MR appears moderate, but may be underestimating as splay artifact is seen, though low E wave velocity argues against severe MR  5. The aortic valve was not well visualized. Aortic valve regurgitation is not visualized. No aortic stenosis is present.  6. Aortic dilatation noted. 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  7. The inferior vena cava is dilated in size with >50% respiratory variability, suggesting right atrial pressure of 8 mmHg.  CT head.  Nonacute.    CT chest abdomen pelvis.  Compression fracture of T8 vertebral body of indeterminate age. Please refer to same day dedicated CT. Aneurysmal dilation of ascending aorta and infrarenal abdominal aorta. Enlarged pulmonary artery which can be seen the setting of pulmonary artery hypertension. Stigmata of interstitial and chronic lung changes likely sequela from prior infection/inflammation, increased to prior exam from 2023. No suspicious pulmonary nodule.  CT C-spine.  1. No acute abnormality of the cervical spine related to the reported polytrauma and fall. 2. Mild endplate degenerative changes at C4-5, C5-6, and C6-7.  CT T-spine.   1. Acute superior endplate compression fracture of T8 with 5 mm loss of height compared to prior study, approximately 30%. No retropulsed bone is present. No other acute fractures are present.    CT L-spine. 1. No evidence of acute traumatic injury. 2. Atherosclerotic calcifications in the abdominal aorta with infrarenal abdominal aortic aneurysm. The maximal transverse diameter of 3.0 cm. Recommend CT angio of the abdomen and pelvis in 3 years. 3. 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      Disposition Plan  :  Status is: Inpatient  DVT Prophylaxis  :  Hep gtt, SCDs    Lab Results  Component Value Date   PLT 179 04/30/2024    Diet :  Diet Order             Diet Heart Room service appropriate? Yes; Fluid consistency: Thin  Diet effective now                    Inpatient Medications  Scheduled Meds:  apixaban   5 mg Oral BID   aspirin  EC  81 mg Oral Daily   carvedilol   12.5 mg Oral BID WC   docusate sodium   200 mg Oral BID   neomycin -bacitracin -polymyxin  1 Application Apply externally BID   polyethylene glycol  17 g Oral BID   umeclidinium-vilanterol  1 puff Inhalation Daily   Continuous Infusions:  ceFEPime  (MAXIPIME ) IV 2 g (04/30/24 0934)   PRN Meds:.acetaminophen  **OR** acetaminophen , albuterol , ondansetron  **OR** ondansetron  (ZOFRAN ) IV, oxyCODONE        Objective:   Vitals:   04/29/24 1713 04/29/24 2000 04/30/24 0740 04/30/24 0928  BP: (!) 139/99  (!) 151/100 (!) 155/95  Pulse: 97  83 (!) 107  Resp:   14   Temp:  97.8 F (36.6 C) 99 F (37.2 C)   TempSrc:  Oral Oral   SpO2:   92%   Weight:      Height:        Wt Readings from Last 3 Encounters:  04/25/24 56.7 kg  04/02/24 59 kg  10/09/23 63.1 kg     Intake/Output Summary (Last 24 hours) at 04/30/2024 1105 Last data filed at 04/30/2024 0751 Gross per 24 hour  Intake --  Output 950 ml  Net -950 ml     Physical Exam  Awake Alert, 2, frail, deconditioned Symmetrical Chest wall movement, Good air movement bilaterally, CTAB RRR,No Gallops,Rubs or new Murmurs, No Parasternal Heave +ve B.Sounds, Abd Soft, No tenderness, No rebound - guarding or rigidity. No  Cyanosis, Clubbing or edema, No new Rash or bruise           Data Review:    Recent Labs  Lab 04/25/24 1613 04/25/24 1623 04/26/24 0155 04/27/24 1252 04/28/24 1124 04/29/24 1431 04/30/24 0821  WBC 18.4*  --  17.4* 10.5 8.1 7.5 7.4  HGB 9.9*   < > 9.5* 9.5* 10.0* 10.2* 10.2*  HCT 34.0*   < > 30.7* 30.7* 31.8* 32.6* 32.2*  PLT 181  --  182 164 176 227 179  MCV 76.6*  --  72.4* 72.4* 71.1* 72.0* 70.9*  MCH 22.3*  --  22.4* 22.4* 22.4* 22.5* 22.5*  MCHC 29.1*  --  30.9 30.9 31.4 31.3 31.7  RDW 16.6*  --  16.5* 17.1* 16.9* 17.1* 17.0*  LYMPHSABS 1.8  --   --  2.8 2.2 1.9 1.9  MONOABS 1.0  --   --  0.8 0.6 0.7 0.8  EOSABS 0.0  --   --  0.3 0.4 0.3 0.4  BASOSABS 0.0  --   --  0.1 0.1 0.1 0.1   < > = values in this interval not displayed.    Recent Labs  Lab 04/25/24 1613 04/25/24 1623 04/25/24 2000 04/25/24 2309 04/26/24 0156 04/26/24 9662 04/26/24 0850 04/27/24 1252 04/28/24 1124 04/29/24 0835 04/30/24 0821  NA 143 145  --   --   --  141  --  142 143  143 141 143  K 3.7 3.6  --   --   --  4.1  --  3.7 3.6  3.5 4.7 4.6  CL 109 113*  --   --   --  113*  --  113* 112*  111 108 108  CO2 16*  --   --   --   --  16*  --  20* 24  24 22 23   ANIONGAP 18*  --   --   --   --  12  --  9 7  8 11 12   GLUCOSE 168* 166*  --   --   --  163*  --  92 92  93 73 81  BUN 28* 28*  --   --   --  27*  --  23 17  16 16 12   CREATININE 1.35* 1.20*  --   --   --  1.17*  --  1.00 0.89  0.88 0.78 0.89  AST 72*  --   --   --   --  88*  --  87* 79*  77* 58* 40  ALT 48*  --   --   --   --  46*  --  58* 68*  69* 65* 51*  ALKPHOS 69  --   --   --   --  60  --  68 69  65 70 66  BILITOT 1.4*  --   --   --   --  0.6  --  0.5 0.5  0.3 0.8 0.6  ALBUMIN 4.0  --   --   --   --  3.4*  --  3.0* 3.0*  3.1* 3.3* 2.9*  CRP  --   --   --   --   --   --   --  1.0* 0.9 1.0* 0.8  PROCALCITON  --   --  8.91  --   --   --   --  13.50 6.69 4.30  --   LATICACIDVEN  --  5.4*  --  3.3* 3.2*  --  2.9*  --    --   --   --   INR 2.6*  --   --   --   --   --   --   --   --   --   --   AMMONIA  --   --   --   --   --   --   --  53* 35  --   --   MG  --   --   --   --   --   --   --  1.5* 2.0  2.1 1.9 1.8  PHOS  --   --   --   --   --   --   --   --  2.3*  2.2* 3.4 3.1  CALCIUM  9.4  --   --   --   --  8.2*  --  8.7* 8.7*  8.7* 9.0 9.0      Recent Labs  Lab 04/25/24 1613 04/25/24 1623 04/25/24 2000 04/25/24 2309 04/26/24 0156 04/26/24 0337 04/26/24 0850 04/27/24 1252 04/28/24 1124 04/29/24 0835 04/30/24 0821  CRP  --   --   --   --   --   --   --  1.0* 0.9 1.0* 0.8  PROCALCITON  --   --  8.91  --   --   --   --  13.50 6.69 4.30  --   LATICACIDVEN  --  5.4*  --  3.3* 3.2*  --  2.9*  --   --   --   --   INR 2.6*  --   --   --   --   --   --   --   --   --   --   AMMONIA  --   --   --   --   --   --   --  53* 35  --   --   MG  --   --   --   --   --   --   --  1.5* 2.0  2.1 1.9 1.8  CALCIUM  9.4  --   --   --   --  8.2*  --  8.7* 8.7*  8.7* 9.0 9.0    --------------------------------------------------------------------------------------------------------------- Lab Results  Component Value Date   CHOL 118 04/02/2024   HDL 52 04/02/2024   LDLCALC 48 04/02/2024   TRIG 92 04/02/2024   CHOLHDL 2.3 04/02/2024    Lab Results  Component Value Date   HGBA1C 6.2 (H) 04/02/2024   No results for input(s): TSH, T4TOTAL, FREET4, T3FREE, THYROIDAB in the last 72 hours. No results for input(s): VITAMINB12, FOLATE, FERRITIN, TIBC, IRON, RETICCTPCT in the last 72 hours. ------------------------------------------------------------------------------------------------------------------ Cardiac Enzymes Recent Labs  Lab 04/25/24 1613  CKMB 26.9*    Micro Results Recent Results (from the past 240 hours)  Culture, blood (single)     Status: None   Collection Time: 04/25/24  6:32 PM   Specimen: BLOOD RIGHT ARM  Result Value Ref Range Status   Specimen Description  BLOOD RIGHT ARM  Final   Special Requests   Final    BOTTLES DRAWN AEROBIC AND ANAEROBIC Blood Culture adequate volume   Culture   Final    NO GROWTH 5 DAYS Performed at [facility], [address] [place], [place] [address]    Report Status 04/30/2024 FINAL  Final  Resp panel by RT-PCR (RSV, Flu A&B, Covid) Anterior Nasal Swab     Status: None   Collection Time: 04/25/24 11:09 PM   Specimen: Anterior Nasal Swab  Result Value Ref Range Status   SARS Coronavirus 2 by RT PCR NEGATIVE NEGATIVE Final   Influenza A by PCR NEGATIVE NEGATIVE Final   Influenza B by PCR NEGATIVE NEGATIVE Final    Comment: (NOTE) The Xpert Xpress SARS-CoV-2/FLU/RSV plus assay is intended as an aid in the diagnosis of influenza from Nasopharyngeal swab specimens and should not be used as a sole basis for treatment. Nasal washings and aspirates are unacceptable for Xpert Xpress SARS-CoV-2/FLU/RSV testing.  Fact Sheet for Patients: BloggerCourse.com  Fact Sheet for Healthcare Providers: SeriousBroker.it  This test is not yet approved or cleared by the [place]  FDA and has been authorized for detection and/or diagnosis of SARS-CoV-2 by FDA under an Emergency Use Authorization (EUA). This EUA will remain in effect (meaning this test can be used) for the duration of the COVID-19 declaration under Section 564(b)(1) of the Act, 21 U.S.C. section 360bbb-3(b)(1), unless the authorization is terminated or revoked.     Resp Syncytial Virus by PCR NEGATIVE NEGATIVE Final    Comment: (NOTE) Fact Sheet for Patients: BloggerCourse.com  Fact Sheet for Healthcare Providers: SeriousBroker.it  This test is not yet approved or cleared by the [place]  FDA and has been authorized for detection and/or diagnosis of SARS-CoV-2 by FDA under an Emergency Use Authorization (EUA). This EUA will remain in effect  (meaning this test  can be used) for the duration of the COVID-19 declaration under Section 564(b)(1) of the Act, 21 U.S.C. section 360bbb-3(b)(1), unless the authorization is terminated or revoked.  Performed at [facility], [address], [place], [place] [address]   Respiratory (~20 pathogens) panel by PCR     Status: Abnormal   Collection Time: 04/27/24  7:04 AM   Specimen: Nasopharyngeal Swab; Respiratory  Result Value Ref Range Status   Adenovirus NOT DETECTED NOT DETECTED Final   Coronavirus 229E NOT DETECTED NOT DETECTED Final    Comment: (NOTE) The Coronavirus on the Respiratory Panel, DOES NOT test for the novel  Coronavirus (2019 nCoV)    Coronavirus HKU1 NOT DETECTED NOT DETECTED Final   Coronavirus NL63 NOT DETECTED NOT DETECTED Final   Coronavirus OC43 NOT DETECTED NOT DETECTED Final   Metapneumovirus NOT DETECTED NOT DETECTED Final   Rhinovirus / Enterovirus DETECTED (A) NOT DETECTED Final   Influenza A NOT DETECTED NOT DETECTED Final   Influenza B NOT DETECTED NOT DETECTED Final   Parainfluenza Virus 1 NOT DETECTED NOT DETECTED Final   Parainfluenza Virus 2 NOT DETECTED NOT DETECTED Final   Parainfluenza Virus 3 NOT DETECTED NOT DETECTED Final   Parainfluenza Virus 4 NOT DETECTED NOT DETECTED Final   Respiratory Syncytial Virus NOT DETECTED NOT DETECTED Final   Bordetella pertussis NOT DETECTED NOT DETECTED Final   Bordetella Parapertussis NOT DETECTED NOT DETECTED Final   Chlamydophila pneumoniae NOT DETECTED NOT DETECTED Final   Mycoplasma pneumoniae NOT DETECTED NOT DETECTED Final    Comment: Performed at [facility], [address], [place], [place] [address]  MRSA Next Gen by PCR, Nasal     Status: None   Collection Time: 04/27/24 10:15 AM   Specimen: Nasal Mucosa; Nasal Swab  Result Value Ref Range Status   MRSA by PCR Next Gen NOT DETECTED NOT DETECTED Final    Comment: (NOTE) The GeneXpert MRSA Assay (FDA approved for NASAL specimens  only), is one component of a comprehensive MRSA colonization surveillance program. It is not intended to diagnose MRSA infection nor to guide or monitor treatment for MRSA infections. Test performance is not FDA approved in patients less than 57 years old. Performed at [facility], [address] [place], [place] [address]     Radiology Report CT ELBOW RIGHT WO CONTRAST Result Date: 04/28/2024 CLINICAL DATA:  Elbow pain, chronic, bone abnormality suspected, xray done EXAM: CT OF THE UPPER RIGHT EXTREMITY WITHOUT CONTRAST TECHNIQUE: Multidetector CT imaging of the right elbow was performed according to the standard protocol. RADIATION DOSE REDUCTION: This exam was performed according to the departmental dose-optimization program which includes automated exposure control, adjustment of the mA and/or kV according to patient size and/or use of iterative reconstruction technique. COMPARISON:  Radiographs 04/28/2024 FINDINGS: Bones/Joint/Cartilage No evidence of acute fracture or dislocation. Minimal spurring of the coronoid process. Minimal joint effusion. No evidence of intra-articular loose body. Ligaments Suboptimally assessed by CT. Muscles and Tendons The elbow muscles and tendons appear unremarkable as evaluated by CT. Soft tissues IV tubing present in the antecubital fossa. Apparent mild subcutaneous edema posterolaterally. No evidence of focal fluid collection, other foreign body or soft tissue emphysema. IMPRESSION: 1. No acute osseous findings or significant arthropathic changes. 2. Minimal joint effusion. 3. Mild subcutaneous edema posterolaterally. No evidence of focal fluid collection, other foreign body or soft tissue emphysema. Electronically Signed   By: Elsie Perone M.D.   On: 04/28/2024 19:00  Signature  -   Brayton Lye M.D on 04/30/2024 at 11:05 AM   -  To page go to [email or web address]

## 2024-04-30 NOTE — Plan of Care (Signed)
  Problem: Clinical Measurements: Goal: Ability to maintain clinical measurements within normal limits will improve Outcome: Progressing Goal: Diagnostic test results will improve Outcome: Progressing Goal: Respiratory complications will improve Outcome: Progressing Goal: Cardiovascular complication will be avoided Outcome: Progressing   Problem: Activity: Goal: Risk for activity intolerance will decrease Outcome: Progressing   Problem: Nutrition: Goal: Adequate nutrition will be maintained Outcome: Progressing   Problem: Coping: Goal: Level of anxiety will decrease Outcome: Progressing   Problem: Elimination: Goal: Will not experience complications related to bowel motility Outcome: Progressing Goal: Will not experience complications related to urinary retention Outcome: Progressing   Problem: Pain Managment: Goal: General experience of comfort will improve and/or be controlled Outcome: Progressing   Problem: Safety: Goal: Ability to remain free from injury will improve Outcome: Progressing   Problem: Skin Integrity: Goal: Risk for impaired skin integrity will decrease Outcome: Progressing

## 2024-04-30 NOTE — TOC Progression Note (Addendum)
 Transition of Care [facility]) - Progression Note    Patient Details  Name: Kimberly [name] MRN: [id number] Date of Birth: 19-Sep-1955  Transition of Care [facility] CM/SW Contact  Inocente GORMAN Kindle, LCSW Phone Number: 04/30/2024, 8:57 AM  Clinical Narrative:    8:57 AM-CSW attempted to contact sister again; no answer.  11:47 AM-CSW and MSW Intern met with patient and made her aware that [facility] does not have a bed available. Patient reported she is not familiar with any other facilities and requested CSW contact her sister. CSW explained her sister did not answer the phone and patient asked for CSW to call her after 3pm. CSW inquired if her daughter could help make the decision but patient stated no.   3:19 PM-CSW left voicemail for patient's sister and sent a HIPAA compliant text.   3:59 PM-CSW received call back from patient's sister. CSW went over SNF bed offers. Patient's sister stated that she will discuss with the patient tonight but that she may end up bringing her back home with home health due to not liking the bed offers. She will call CSW tomorrow.    Expected Discharge Plan: Skilled Nursing Facility Barriers to Discharge: Continued Medical Work up, English as a second language teacher, SNF Pending bed offer               Expected Discharge Plan and Services In-house Referral: Clinical Social Work   Post Acute Care Choice: Skilled Nursing Facility Living arrangements for the past 2 months: Single Family Home                                       Social Drivers of Health (SDOH) Interventions SDOH Screenings   Food Insecurity: No Food Insecurity (04/26/2024)  Housing: Low Risk  (04/26/2024)  Transportation Needs: No Transportation Needs (04/26/2024)  Utilities: At Risk (04/26/2024)  Alcohol Screen: Low Risk  (09/21/2023)  Depression (PHQ2-9): Low Risk  (04/02/2024)  Financial Resource Strain: Low Risk  (09/21/2023)  Physical Activity: Unknown (09/21/2023)  Social Connections:  Socially Isolated (04/26/2024)  Stress: No Stress Concern Present (09/21/2023)  Tobacco Use: Medium Risk (04/25/2024)  Health Literacy: Inadequate Health Literacy (09/21/2023)    Readmission Risk Interventions     No data to display

## 2024-04-30 NOTE — Plan of Care (Signed)

## 2024-05-01 DIAGNOSIS — R55 Syncope and collapse: Secondary | ICD-10-CM | POA: Diagnosis not present

## 2024-05-01 LAB — COMPREHENSIVE METABOLIC PANEL WITH GFR
ALT: 49 U/L — ABNORMAL HIGH (ref 0–44)
AST: 37 U/L (ref 15–41)
Albumin: 3.4 g/dL — ABNORMAL LOW (ref 3.5–5.0)
Alkaline Phosphatase: 71 U/L (ref 38–126)
Anion gap: 13 (ref 5–15)
BUN: 10 mg/dL (ref 8–23)
CO2: 27 mmol/L (ref 22–32)
Calcium: 9.6 mg/dL (ref 8.9–10.3)
Chloride: 103 mmol/L (ref 98–111)
Creatinine, Ser: 0.76 mg/dL (ref 0.44–1.00)
GFR, Estimated: >60 mL/min (ref >60)
Glucose, Bld: 91 mg/dL (ref 70–99)
Potassium: 4.6 mmol/L (ref 3.5–5.1)
Sodium: 143 mmol/L (ref 135–145)
Total Bilirubin: 0.8 mg/dL (ref 0.0–1.2)
Total Protein: 6.5 g/dL (ref 6.5–8.1)

## 2024-05-01 LAB — CBC WITH DIFFERENTIAL/PLATELET
Abs Immature Granulocytes: 0.05 K/uL (ref 0.00–0.07)
Basophils Absolute: 0.1 K/uL (ref 0.0–0.1)
Basophils Relative: 1 %
Eosinophils Absolute: 0.3 K/uL (ref 0.0–0.5)
Eosinophils Relative: 5 %
HCT: 35 % — ABNORMAL LOW (ref 36.0–46.0)
Hemoglobin: 11 g/dL — ABNORMAL LOW (ref 12.0–15.0)
Immature Granulocytes: 1 %
Lymphocytes Relative: 30 %
Lymphs Abs: 2.1 K/uL (ref 0.7–4.0)
MCH: 22.5 pg — ABNORMAL LOW (ref 26.0–34.0)
MCHC: 31.4 g/dL (ref 30.0–36.0)
MCV: 71.6 fL — ABNORMAL LOW (ref 80.0–100.0)
Monocytes Absolute: 0.7 K/uL (ref 0.1–1.0)
Monocytes Relative: 10 %
Neutro Abs: 3.8 K/uL (ref 1.7–7.7)
Neutrophils Relative %: 53 %
Platelets: 199 K/uL (ref 150–400)
RBC: 4.89 MIL/uL (ref 3.87–5.11)
RDW: 17.1 % — ABNORMAL HIGH (ref 11.5–15.5)
WBC: 6.9 K/uL (ref 4.0–10.5)
nRBC: 0 % (ref 0.0–0.2)

## 2024-05-01 LAB — CK: Total CK: 244 U/L — ABNORMAL HIGH (ref 38–234)

## 2024-05-01 LAB — PHOSPHORUS: Phosphorus: 3.8 mg/dL (ref 2.5–4.6)

## 2024-05-01 LAB — PROCALCITONIN: Procalcitonin: 1.28 ng/mL

## 2024-05-01 LAB — MAGNESIUM: Magnesium: 2.1 mg/dL (ref 1.7–2.4)

## 2024-05-01 LAB — C-REACTIVE PROTEIN: CRP: 0.7 mg/dL (ref <1.0)

## 2024-05-01 MED ORDER — HYDRALAZINE HCL 10 MG PO TABS
10.0000 mg | ORAL_TABLET | Freq: Three times a day (TID) | ORAL | Status: DC
  Administered 2024-05-01 – 2024-05-02 (×3): 10 mg via ORAL
  Filled 2024-05-01 (×4): qty 1

## 2024-05-01 MED ORDER — PANTOPRAZOLE SODIUM 40 MG PO TBEC
40.0000 mg | DELAYED_RELEASE_TABLET | Freq: Every day | ORAL | Status: DC
  Administered 2024-05-01 – 2024-05-03 (×3): 40 mg via ORAL
  Filled 2024-05-01 (×3): qty 1

## 2024-05-01 MED ORDER — OYSTER SHELL CALCIUM/D3 500-5 MG-MCG PO TABS
1.0000 | ORAL_TABLET | Freq: Two times a day (BID) | ORAL | Status: DC
  Administered 2024-05-01 – 2024-05-03 (×5): 1 via ORAL
  Filled 2024-05-01 (×5): qty 1

## 2024-05-01 MED ORDER — ISOSORBIDE MONONITRATE ER 30 MG PO TB24
30.0000 mg | ORAL_TABLET | Freq: Every day | ORAL | Status: DC
  Administered 2024-05-01: 30 mg via ORAL
  Filled 2024-05-01: qty 1

## 2024-05-01 MED ORDER — METHYLPREDNISOLONE SODIUM SUCC 125 MG IJ SOLR
125.0000 mg | Freq: Once | INTRAMUSCULAR | Status: AC
  Administered 2024-05-01: 125 mg via INTRAVENOUS
  Filled 2024-05-01: qty 2

## 2024-05-01 MED ORDER — BISACODYL 5 MG PO TBEC
10.0000 mg | DELAYED_RELEASE_TABLET | Freq: Once | ORAL | Status: AC
  Administered 2024-05-01: 10 mg via ORAL
  Filled 2024-05-01: qty 2

## 2024-05-01 NOTE — Progress Notes (Signed)
 Progress Note  Patient Name: Kimberly [name] Date of Encounter: 05/01/2024  Primary Cardiologist:   Lynwood Schilling, MD   Subjective   She denies chest pain or SOB.   Inpatient Medications    Scheduled Meds:  apixaban   5 mg Oral BID   aspirin  EC  81 mg Oral Daily   calcium -vitamin D   1 tablet Oral BID WC   carvedilol   25 mg Oral BID WC   docusate sodium   200 mg Oral BID   neomycin -bacitracin -polymyxin  1 Application Apply externally BID   polyethylene glycol  17 g Oral BID   umeclidinium-vilanterol  1 puff Inhalation Daily   Continuous Infusions:   PRN Meds: acetaminophen  **OR** acetaminophen , albuterol , ondansetron  **OR** ondansetron  (ZOFRAN ) IV, oxyCODONE    Vital Signs    Vitals:   04/30/24 2000 04/30/24 2355 05/01/24 0343 05/01/24 0800  BP: (!) 125/103 (!) 146/106 (!) 153/89 (!) 151/96  Pulse: 81 92 91 87  Resp: 16 15 18  (!) 21  Temp: 98.5 F (36.9 C) 98.3 F (36.8 C) 98 F (36.7 C) 98.7 F (37.1 C)  TempSrc: Oral Oral Oral Oral  SpO2: 96% 94% 96% 100%  Weight:      Height:        Intake/Output Summary (Last 24 hours) at 05/01/2024 0839 Last data filed at 05/01/2024 0341 Gross per 24 hour  Intake --  Output 1450 ml  Net -1450 ml   Filed Weights   04/25/24 1625  Weight: 56.7 kg    Telemetry    Atrial fib. Rate slightly increased .  - Personally Reviewed  ECG    NA  Physical Exam   GEN: No  acute distress.   Neck: No  JVD Cardiac: Irregular RR, no murmurs, rubs, or gallops.  Respiratory: Clear   to auscultation bilaterally. GI: Soft, nontender, non-distended, normal bowel sounds  MS:  No edema; No deformity. Neuro:   Nonfocal  Psych: Oriented and appropriate     Labs    Chemistry Recent Labs  Lab 04/28/24 1124 04/29/24 0835 04/30/24 0821  NA 143  143 141 143  K 3.6  3.5 4.7 4.6  CL 112*  111 108 108  CO2 24  24 22 23   GLUCOSE 92  93 73 81  BUN 17  16 16 12   CREATININE 0.89  0.88 0.78 0.89  CALCIUM  8.7*  8.7*  9.0 9.0  PROT 5.3*  5.7* 6.4* 5.7*  ALBUMIN 3.0*  3.1* 3.3* 2.9*  AST 79*  77* 58* 40  ALT 68*  69* 65* 51*  ALKPHOS 69  65 70 66  BILITOT 0.5  0.3 0.8 0.6  GFRNONAA >60  >60 >60 >60  ANIONGAP 7  8 11 12      Hematology Recent Labs  Lab 04/29/24 1431 04/30/24 0821 05/01/24 0649  WBC 7.5 7.4 6.9  RBC 4.53 4.54 4.89  HGB 10.2* 10.2* 11.0*  HCT 32.6* 32.2* 35.0*  MCV 72.0* 70.9* 71.6*  MCH 22.5* 22.5* 22.5*  MCHC 31.3 31.7 31.4  RDW 17.1* 17.0* 17.1*  PLT 227 179 199    Cardiac EnzymesNo results for input(s): TROPONINI in the last 168 hours. No results for input(s): TROPIPOC in the last 168 hours.   BNPNo results for input(s): BNP, PROBNP in the last 168 hours.   DDimer No results for input(s): DDIMER in the last 168 hours.   Radiology    No results found.   Cardiac Studies   See above  Patient Profile  68 y.o. 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   Assessment & Plan    Elevated troponin:  History of CABG.  Suspect demand ischemia.  EF is mildly reduced with global hypokinesis.  Not an invasive evaluation candidate.  She does have T wave inversion and newly reduced EF with regional wall motion consistent with previous infarct as identified on previous perfusion study.    I am medically managing for now and have increased the beta blocker.  I will plan to follow as an out patient and suggest further evaluation at that time.  I did try to call her sister to update her per the patient request.  I will try again later today.    Cardiomyopathy:  I am going to add low dose hydral and nitrates for  afterload.  Avoiding ARB/ARNi right now with recent AKI.    HTN:  This is being managed in the context of treating his CHF   MR:  Moderate to severe.   This is new (was mild in 2023).   I will follow this up with repeat echo as an out patient after med titration.     AKI:   Resolved  Prolonged QT:  Avoid QT prolonging meds.      PAF:  Coreg  increased yesterday.  Continue current therapy.    For questions or updates, please contact [facility] Please consult [email or web address] for contact info under Cardiology/STEMI.   Signed, Lynwood Schilling, MD  05/01/2024, 8:39 AM

## 2024-05-01 NOTE — Plan of Care (Signed)

## 2024-05-01 NOTE — Progress Notes (Signed)
 PROGRESS NOTE                                                                                                                                                                                                             Patient Demographics:    Kimberly [name], is a 68 y.o. female, DOB - [birth year], FMW:[id number]  Outpatient Primary MD for the patient is Georgina Speaks, FNP    LOS - 6  Admit date - 04/25/2024    Chief Complaint  Patient presents with   Fall       Brief Narrative (HPI from H&P)      68 y.o. 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   Patient is found to have elevated white count, tachycardia, lactic acidosis, elevated troponin. Admitted to [facility] service with cardiology evaluation.   Subjective:   Patient in bed, she appears comfortable, he is complaining of pain everywhere, does not recall when her last BM.    Assessment  & Plan :    Sepsis, POA due to acute bronchitis (likely bacterial) - Patient presented with tachycardia, tachypnea, lactic acidosis, elevated white count.  -COVID respiratory viral panel negative, HIV nonreactive, blood cultures pending thus far negative.  -  patient mild URI/bronchitis, finished her antibiotics.     Paroxysmal A-fib with RVR  chronic systolic heart failure EF 45% with regional wall motion abnormality  Elevated troponins CAD status post CABG Hypertension -Management per cardiology -troponin rise due to demand ischemia from sepsis and atrial fibrillation.  -Seen by cardiology, was initially on heparin  drip now on Eliquis . - Continue  with medical management, currently on Coreg , started on low-dose hydralazine  and nitrate for afterload load reduction . - Avoiding ARB/ARNI due to AKI   Severe MR - Plan to repeat echo as an outpatient per cardiology  Prolonged QTc - Avoid prolonging agents  Near syncope Status post mechanical fall, T8 compression fracture & Rhabdomyolysis - Patient does have a history of dizziness. Continue telemetry rule out cardiac arrhythmias. Echocardiogram as above.  She is s/p gentle IV hydration.  Trend CK. PT/ OT  evaluation, out of bed to chair.  TLSO brace ordered for the T8 compression fracture, CT findings discussed with Dr. Joshua.  No surgical intervention with outpatient follow-up with neurosurgery if desired.  - Will start on calcium  and vitamin D   Acute kidney injury Rhabdomyolysis - resolved..   Chronic right arm pain.  - We have imaged right shoulder, right elbow and right wrist.  Stable.  Patient states this pain has been ongoing for several months.  Could be neuropathic, outpatient follow-up with PCP.    Elevated LFTs - In the setting of sepsis and rhabdomyolysis, pain-free, CT abdomen pelvis stable, trending down       Condition - Extremely Guarded  Family Communication  : None at bedside  Code Status :  Full  Consults  :  Cards  PUD Prophylaxis :    Procedures  :     TTE - 1. Left ventricular ejection fraction, by estimation, is 45 to 50%. The left ventricle has mildly decreased function. The left ventricle demonstrates regional wall motion abnormalities (see scoring diagram/findings for description). There is mild left ventricular hypertrophy. Left ventricular diastolic parameters are indeterminate.  2. Right ventricular systolic function is mildly reduced. The right ventricular size is normal. There is normal pulmonary artery systolic pressure. The estimated right ventricular systolic pressure is 26.8 mmHg.  3. Left atrial size was severely dilated.  4. The mitral valve is  abnormal. Moderate to severe mitral valve regurgitation. Visually MR appears moderate, but may be underestimating as splay artifact is seen, though low E wave velocity argues against severe MR  5. The aortic valve was not well visualized. Aortic valve regurgitation is not visualized. No aortic stenosis is present.  6. Aortic dilatation noted. 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  7. The inferior vena cava is dilated in size with >50% respiratory variability, suggesting right atrial pressure of 8 mmHg.  CT head.  Nonacute.    CT chest abdomen pelvis.  Compression fracture of T8 vertebral body of indeterminate age. Please refer to same day dedicated CT. Aneurysmal dilation of ascending aorta and infrarenal abdominal aorta. Enlarged pulmonary artery which can be seen the setting of pulmonary artery hypertension. Stigmata of interstitial and chronic lung changes likely sequela from prior infection/inflammation, increased to prior exam from 2023. No suspicious pulmonary nodule.  CT C-spine.  1. No acute abnormality of the cervical spine related to the reported polytrauma and fall. 2. Mild endplate degenerative changes at C4-5, C5-6, and C6-7.  CT T-spine.   1. Acute superior endplate compression fracture of T8 with 5 mm loss of height compared to prior study, approximately 30%. No retropulsed bone is present. No other acute fractures are present.   CT L-spine. 1. No evidence of acute traumatic injury. 2. Atherosclerotic calcifications in the abdominal aorta with infrarenal abdominal aortic aneurysm. The maximal transverse diameter of 3.0 cm. Recommend CT angio of the abdomen and pelvis in 3 years. 3. 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      Disposition Plan  :    Status is: Inpatient  DVT Prophylaxis  :  Hep gtt, SCDs    Lab Results  Component Value Date   PLT 199 05/01/2024    Diet :  Diet Order             Diet Heart Room service appropriate? Yes; Fluid  consistency: Thin  Diet effective now  Inpatient Medications  Scheduled Meds:  apixaban   5 mg Oral BID   aspirin  EC  81 mg Oral Daily   bisacodyl   10 mg Oral Once   calcium -vitamin D   1 tablet Oral BID WC   carvedilol   25 mg Oral BID WC   docusate sodium   200 mg Oral BID   hydrALAZINE   10 mg Oral Q8H   isosorbide  mononitrate  30 mg Oral Daily   methylPREDNISolone  (SOLU-MEDROL ) injection  125 mg Intravenous Once   neomycin -bacitracin -polymyxin  1 Application Apply externally BID   pantoprazole   40 mg Oral Daily   polyethylene glycol  17 g Oral BID   umeclidinium-vilanterol  1 puff Inhalation Daily   Continuous Infusions:   PRN Meds:.acetaminophen  **OR** acetaminophen , albuterol , ondansetron  **OR** ondansetron  (ZOFRAN ) IV, oxyCODONE        Objective:   Vitals:   04/30/24 2000 04/30/24 2355 05/01/24 0343 05/01/24 0800  BP: (!) 125/103 (!) 146/106 (!) 153/89 (!) 151/96  Pulse: 81 92 91 87  Resp: 16 15 18  (!) 21  Temp: 98.5 F (36.9 C) 98.3 F (36.8 C) 98 F (36.7 C) 98.7 F (37.1 C)  TempSrc: Oral Oral Oral Oral  SpO2: 96% 94% 96% 100%  Weight:      Height:        Wt Readings from Last 3 Encounters:  04/25/24 56.7 kg  04/02/24 59 kg  10/09/23 63.1 kg     Intake/Output Summary (Last 24 hours) at 05/01/2024 1209 Last data filed at 05/01/2024 0341 Gross per 24 hour  Intake --  Output 1450 ml  Net -1450 ml     Physical Exam  Awake, alert, no apparent distress, frail, chronically ill-appearing Symmetrical Chest wall movement, Good air movement bilaterally Irregular no Gallops,Rubs +ve B.Sounds, Abd Soft No Cyanosis, Clubbing or edema, No new Rash or bruise           Data Review:    Recent Labs  Lab 04/27/24 1252 04/28/24 1124 04/29/24 1431 04/30/24 0821 05/01/24 0649  WBC 10.5 8.1 7.5 7.4 6.9  HGB 9.5* 10.0* 10.2* 10.2* 11.0*  HCT 30.7* 31.8* 32.6* 32.2* 35.0*  PLT 164 176 227 179 199  MCV 72.4* 71.1* 72.0* 70.9*  71.6*  MCH 22.4* 22.4* 22.5* 22.5* 22.5*  MCHC 30.9 31.4 31.3 31.7 31.4  RDW 17.1* 16.9* 17.1* 17.0* 17.1*  LYMPHSABS 2.8 2.2 1.9 1.9 2.1  MONOABS 0.8 0.6 0.7 0.8 0.7  EOSABS 0.3 0.4 0.3 0.4 0.3  BASOSABS 0.1 0.1 0.1 0.1 0.1    Recent Labs  Lab 04/25/24 1613 04/25/24 1623 04/25/24 2000 04/25/24 2309 04/26/24 0156 04/26/24 0337 04/26/24 0850 04/27/24 1252 04/28/24 1124 04/29/24 0835 04/30/24 0821  NA 143 145  --   --   --  141  --  142 143  143 141 143  K 3.7 3.6  --   --   --  4.1  --  3.7 3.6  3.5 4.7 4.6  CL 109 113*  --   --   --  113*  --  113* 112*  111 108 108  CO2 16*  --   --   --   --  16*  --  20* 24  24 22 23   ANIONGAP 18*  --   --   --   --  12  --  9 7  8 11 12   GLUCOSE 168* 166*  --   --   --  163*  --  92 92  93 73 81  BUN 28* 28*  --   --   --  27*  --  23 17  16 16 12   CREATININE 1.35* 1.20*  --   --   --  1.17*  --  1.00 0.89  0.88 0.78 0.89  AST 72*  --   --   --   --  88*  --  87* 79*  77* 58* 40  ALT 48*  --   --   --   --  46*  --  58* 68*  69* 65* 51*  ALKPHOS 69  --   --   --   --  60  --  68 69  65 70 66  BILITOT 1.4*  --   --   --   --  0.6  --  0.5 0.5  0.3 0.8 0.6  ALBUMIN 4.0  --   --   --   --  3.4*  --  3.0* 3.0*  3.1* 3.3* 2.9*  CRP  --   --   --   --   --   --   --  1.0* 0.9 1.0* 0.8  PROCALCITON  --   --  8.91  --   --   --   --  13.50 6.69 4.30 2.45  LATICACIDVEN  --  5.4*  --  3.3* 3.2*  --  2.9*  --   --   --   --   INR 2.6*  --   --   --   --   --   --   --   --   --   --   AMMONIA  --   --   --   --   --   --   --  53* 35  --   --   MG  --   --   --   --   --   --   --  1.5* 2.0  2.1 1.9 1.8  PHOS  --   --   --   --   --   --   --   --  2.3*  2.2* 3.4 3.1  CALCIUM  9.4  --   --   --   --  8.2*  --  8.7* 8.7*  8.7* 9.0 9.0      Recent Labs  Lab 04/25/24 1613 04/25/24 1623 04/25/24 2000 04/25/24 2309 04/26/24 0156 04/26/24 0337 04/26/24 0850 04/27/24 1252 04/28/24 1124 04/29/24 0835 04/30/24 0821  CRP  --    --   --   --   --   --   --  1.0* 0.9 1.0* 0.8  PROCALCITON  --   --  8.91  --   --   --   --  13.50 6.69 4.30 2.45  LATICACIDVEN  --  5.4*  --  3.3* 3.2*  --  2.9*  --   --   --   --   INR 2.6*  --   --   --   --   --   --   --   --   --   --   AMMONIA  --   --   --   --   --   --   --  53* 35  --   --   MG  --   --   --   --   --   --   --  1.5* 2.0  2.1  1.9 1.8  CALCIUM  9.4  --   --   --   --  8.2*  --  8.7* 8.7*  8.7* 9.0 9.0    --------------------------------------------------------------------------------------------------------------- Lab Results  Component Value Date   CHOL 118 04/02/2024   HDL 52 04/02/2024   LDLCALC 48 04/02/2024   TRIG 92 04/02/2024   CHOLHDL 2.3 04/02/2024    Lab Results  Component Value Date   HGBA1C 6.2 (H) 04/02/2024   No results for input(s): TSH, T4TOTAL, FREET4, T3FREE, THYROIDAB in the last 72 hours. No results for input(s): VITAMINB12, FOLATE, FERRITIN, TIBC, IRON, RETICCTPCT in the last 72 hours. ------------------------------------------------------------------------------------------------------------------ Cardiac Enzymes Recent Labs  Lab 04/25/24 1613  CKMB 26.9*    Micro Results Recent Results (from the past 240 hours)  Culture, blood (single)     Status: None   Collection Time: 04/25/24  6:32 PM   Specimen: BLOOD RIGHT ARM  Result Value Ref Range Status   Specimen Description BLOOD RIGHT ARM  Final   Special Requests   Final    BOTTLES DRAWN AEROBIC AND ANAEROBIC Blood Culture adequate volume   Culture   Final    NO GROWTH 5 DAYS Performed at [facility], [address], [place], [place] [address]    Report Status 04/30/2024 FINAL  Final  Resp panel by RT-PCR (RSV, Flu A&B, Covid) Anterior Nasal Swab     Status: None   Collection Time: 04/25/24 11:09 PM   Specimen: Anterior Nasal Swab  Result Value Ref Range Status   SARS Coronavirus 2 by RT PCR NEGATIVE NEGATIVE Final   Influenza A by PCR  NEGATIVE NEGATIVE Final   Influenza B by PCR NEGATIVE NEGATIVE Final    Comment: (NOTE) The Xpert Xpress SARS-CoV-2/FLU/RSV plus assay is intended as an aid in the diagnosis of influenza from Nasopharyngeal swab specimens and should not be used as a sole basis for treatment. Nasal washings and aspirates are unacceptable for Xpert Xpress SARS-CoV-2/FLU/RSV testing.  Fact Sheet for Patients: BloggerCourse.com  Fact Sheet for Healthcare Providers: SeriousBroker.it  This test is not yet approved or cleared by the [place]  FDA and has been authorized for detection and/or diagnosis of SARS-CoV-2 by FDA under an Emergency Use Authorization (EUA). This EUA will remain in effect (meaning this test can be used) for the duration of the COVID-19 declaration under Section 564(b)(1) of the Act, 21 U.S.C. section 360bbb-3(b)(1), unless the authorization is terminated or revoked.     Resp Syncytial Virus by PCR NEGATIVE NEGATIVE Final    Comment: (NOTE) Fact Sheet for Patients: BloggerCourse.com  Fact Sheet for Healthcare Providers: SeriousBroker.it  This test is not yet approved or cleared by the [place]  FDA and has been authorized for detection and/or diagnosis of SARS-CoV-2 by FDA under an Emergency Use Authorization (EUA). This EUA will remain in effect (meaning this test can be used) for the duration of the COVID-19 declaration under Section 564(b)(1) of the Act, 21 U.S.C. section 360bbb-3(b)(1), unless the authorization is terminated or revoked.  Performed at [facility], [address] [place], [place] [address]   Respiratory (~20 pathogens) panel by PCR     Status: Abnormal   Collection Time: 04/27/24  7:04 AM   Specimen: Nasopharyngeal Swab; Respiratory  Result Value Ref Range Status   Adenovirus NOT DETECTED NOT DETECTED Final   Coronavirus 229E NOT DETECTED  NOT DETECTED Final    Comment: (NOTE) The Coronavirus on the Respiratory Panel, DOES NOT test for the novel  Coronavirus (2019 nCoV)    Coronavirus HKU1 NOT DETECTED NOT DETECTED Final   Coronavirus NL63 NOT DETECTED NOT DETECTED Final   Coronavirus OC43 NOT DETECTED NOT DETECTED Final   Metapneumovirus NOT DETECTED NOT DETECTED Final   Rhinovirus / Enterovirus DETECTED (A) NOT DETECTED Final   Influenza A NOT DETECTED NOT DETECTED Final   Influenza B NOT DETECTED NOT DETECTED Final   Parainfluenza Virus 1 NOT DETECTED NOT DETECTED Final   Parainfluenza Virus 2 NOT DETECTED NOT DETECTED Final   Parainfluenza Virus 3 NOT DETECTED NOT DETECTED Final   Parainfluenza Virus 4 NOT DETECTED NOT DETECTED Final   Respiratory Syncytial Virus NOT DETECTED NOT DETECTED Final   Bordetella pertussis NOT DETECTED NOT DETECTED Final   Bordetella Parapertussis NOT DETECTED NOT DETECTED Final   Chlamydophila pneumoniae NOT DETECTED NOT DETECTED Final   Mycoplasma pneumoniae NOT DETECTED NOT DETECTED Final    Comment: Performed at [facility], [address] [place], [place] [address]  MRSA Next Gen by PCR, Nasal     Status: None   Collection Time: 04/27/24 10:15 AM   Specimen: Nasal Mucosa; Nasal Swab  Result Value Ref Range Status   MRSA by PCR Next Gen NOT DETECTED NOT DETECTED Final    Comment: (NOTE) The GeneXpert MRSA Assay (FDA approved for NASAL specimens only), is one component of a comprehensive MRSA colonization surveillance program. It is not intended to diagnose MRSA infection nor to guide or monitor treatment for MRSA infections. Test performance is not FDA approved in patients less than 110 years old. Performed at [facility], [address], [place], [place] [address]     Radiology Report No results found.    Signature  -   Brayton Lye M.D on 05/01/2024 at 12:09 PM   -  To page go to [email or web address]

## 2024-05-01 NOTE — Plan of Care (Signed)

## 2024-05-01 NOTE — TOC Progression Note (Signed)
 Transition of Care [facility]) - Progression Note    Patient Details  Name: Kimberly [name] MRN: [id number] Date of Birth: 1956/05/30  Transition of [facility]) CM/SW Contact  Inocente GORMAN Kindle, LCSW Phone Number: 05/01/2024, 4:44 PM  Clinical Narrative:    CSW spoke with patient's sister. She was able to discuss with patient who is hopeful for rehab prior to returning home with sister. Sister asked CSW to check for beds at [facility] or [facility] if [facility] is full. CSW will follow up and requested [facility] review the referral. Will need an updated PT note for insurance process.    Expected Discharge Plan: Skilled Nursing Facility Barriers to Discharge: Continued Medical Work up, English as a second language teacher, SNF Pending bed offer               Expected Discharge Plan and Services In-house Referral: Clinical Social Work   Post Acute Care Choice: Skilled Nursing Facility Living arrangements for the past 2 months: Single Family Home                                       Social Drivers of Health (SDOH) Interventions SDOH Screenings   Food Insecurity: No Food Insecurity (04/26/2024)  Housing: Low Risk  (04/26/2024)  Transportation Needs: No Transportation Needs (04/26/2024)  Utilities: At Risk (04/26/2024)  Alcohol Screen: Low Risk  (09/21/2023)  Depression (PHQ2-9): Low Risk  (04/02/2024)  Financial Resource Strain: Low Risk  (09/21/2023)  Physical Activity: Unknown (09/21/2023)  Social Connections: Socially Isolated (04/26/2024)  Stress: No Stress Concern Present (09/21/2023)  Tobacco Use: Medium Risk (04/25/2024)  Health Literacy: Inadequate Health Literacy (09/21/2023)    Readmission Risk Interventions     No data to display

## 2024-05-02 DIAGNOSIS — I10 Essential (primary) hypertension: Secondary | ICD-10-CM

## 2024-05-02 DIAGNOSIS — I429 Cardiomyopathy, unspecified: Secondary | ICD-10-CM

## 2024-05-02 DIAGNOSIS — R55 Syncope and collapse: Secondary | ICD-10-CM | POA: Diagnosis not present

## 2024-05-02 MED ORDER — SENNOSIDES-DOCUSATE SODIUM 8.6-50 MG PO TABS
1.0000 | ORAL_TABLET | Freq: Once | ORAL | Status: AC
  Administered 2024-05-02: 1 via ORAL
  Filled 2024-05-02: qty 1

## 2024-05-02 MED ORDER — SACUBITRIL-VALSARTAN 24-26 MG PO TABS
1.0000 | ORAL_TABLET | Freq: Two times a day (BID) | ORAL | Status: DC
  Administered 2024-05-02 – 2024-05-03 (×3): 1 via ORAL
  Filled 2024-05-02 (×4): qty 1

## 2024-05-02 MED ORDER — BISACODYL 5 MG PO TBEC
10.0000 mg | DELAYED_RELEASE_TABLET | Freq: Once | ORAL | Status: AC
  Administered 2024-05-02: 10 mg via ORAL
  Filled 2024-05-02: qty 2

## 2024-05-02 MED ORDER — COLCHICINE 0.6 MG PO TABS
0.6000 mg | ORAL_TABLET | Freq: Once | ORAL | Status: AC
  Administered 2024-05-02: 0.6 mg via ORAL
  Filled 2024-05-02: qty 1

## 2024-05-02 NOTE — Progress Notes (Signed)
 PROGRESS NOTE                                                                                                                                                                                                             Patient Demographics:    Kimberly [name], is a 68 y.o. female, DOB - 02/07/1956, FMW:[id number]  Outpatient Primary MD for the patient is Georgina Speaks, FNP    LOS - 7  Admit date - 04/25/2024    Chief Complaint  Patient presents with   Fall       Brief Narrative (HPI from H&P)      68 y.o. 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   Patient is found to have elevated white count, tachycardia, lactic acidosis, elevated troponin. Admitted to [facility] service with cardiology evaluation.   Subjective:   She is still complaining of generalized body ache, still reports constipation.   Assessment  & Plan :    Sepsis, POA due to acute bronchitis (likely bacterial) - Patient presented with tachycardia, tachypnea, lactic acidosis, elevated white count.  -COVID respiratory viral panel negative, HIV nonreactive, blood cultures pending thus far negative.  -  patient mild URI/bronchitis, finished her antibiotics.     Paroxysmal A-fib with RVR  chronic systolic heart failure EF 45% with regional wall motion abnormality  Elevated troponins CAD status post CABG Hypertension -Management per cardiology -troponin rise due to demand ischemia from sepsis and atrial fibrillation.  -Seen by cardiology, was initially on heparin  drip now on Eliquis . - Continue with medical management, currently on  Coreg , started on low-dose hydralazine  and nitrate for afterload load reduction . - GI has resolved, so started on low-dose Entresto  by cardiology, will monitor closely given borderline blood pressure.  Severe MR - Plan to repeat echo as an outpatient per cardiology  Prolonged QTc - Avoid prolonging agents  Near syncope Status post mechanical fall, T8 compression fracture & Rhabdomyolysis - Patient does have a history of dizziness. Continue telemetry rule out cardiac arrhythmias. Echocardiogram as above.  She is s/p gentle IV hydration.  Trend CK.  PT/ OT evaluation, out of bed to chair.  TLSO brace ordered for the T8 compression fracture, CT findings discussed with Dr. Joshua.  No surgical intervention with outpatient follow-up with neurosurgery if desired.  - Will start on calcium  and vitamin D   Acute kidney injury Rhabdomyolysis - resolved..   Chronic right arm pain.  - We have imaged right shoulder, right elbow and right wrist.  Stable.  Patient states this pain has been ongoing for several months.  Could be neuropathic, outpatient follow-up with PCP.    Elevated LFTs - In the setting of sepsis and rhabdomyolysis, pain-free, CT abdomen pelvis stable, trending down       Condition - Extremely Guarded  Family Communication  : None at bedside  Code Status :  Full  Consults  :  Cards  PUD Prophylaxis :    Procedures  :     TTE - 1. Left ventricular ejection fraction, by estimation, is 45 to 50%. The left ventricle has mildly decreased function. The left ventricle demonstrates regional wall motion abnormalities (see scoring diagram/findings for description). There is mild left ventricular hypertrophy. Left ventricular diastolic parameters are indeterminate.  2. Right ventricular systolic function is mildly reduced. The right ventricular size is normal. There is normal pulmonary artery systolic pressure. The estimated right ventricular systolic pressure is 26.8 mmHg.  3. Left atrial  size was severely dilated.  4. The mitral valve is abnormal. Moderate to severe mitral valve regurgitation. Visually MR appears moderate, but may be underestimating as splay artifact is seen, though low E wave velocity argues against severe MR  5. The aortic valve was not well visualized. Aortic valve regurgitation is not visualized. No aortic stenosis is present.  6. Aortic dilatation noted. 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  7. The inferior vena cava is dilated in size with >50% respiratory variability, suggesting right atrial pressure of 8 mmHg.  CT head.  Nonacute.    CT chest abdomen pelvis.  Compression fracture of T8 vertebral body of indeterminate age. Please refer to same day dedicated CT. Aneurysmal dilation of ascending aorta and infrarenal abdominal aorta. Enlarged pulmonary artery which can be seen the setting of pulmonary artery hypertension. Stigmata of interstitial and chronic lung changes likely sequela from prior infection/inflammation, increased to prior exam from 2023. No suspicious pulmonary nodule.  CT C-spine.  1. No acute abnormality of the cervical spine related to the reported polytrauma and fall. 2. Mild endplate degenerative changes at C4-5, C5-6, and C6-7.  CT T-spine.   1. Acute superior endplate compression fracture of T8 with 5 mm loss of height compared to prior study, approximately 30%. No retropulsed bone is present. No other acute fractures are present.   CT L-spine. 1. No evidence of acute traumatic injury. 2. Atherosclerotic calcifications in the abdominal aorta with infrarenal abdominal aortic aneurysm. The maximal transverse diameter of 3.0 cm. Recommend CT angio of the abdomen and pelvis in 3 years. 3. 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      Disposition Plan  :    Status is: Inpatient  DVT Prophylaxis  : Back on Eliquis   Lab Results  Component Value Date   PLT 199 05/01/2024    Diet :  Diet Order             Diet  Heart Room service appropriate? Yes; Fluid consistency: Thin  Diet effective now  Inpatient Medications  Scheduled Meds:  apixaban   5 mg Oral BID   aspirin  EC  81 mg Oral Daily   calcium -vitamin D   1 tablet Oral BID WC   carvedilol   25 mg Oral BID WC   docusate sodium   200 mg Oral BID   neomycin -bacitracin -polymyxin  1 Application Apply externally BID   pantoprazole   40 mg Oral Daily   polyethylene glycol  17 g Oral BID   sacubitril -valsartan   1 tablet Oral BID   umeclidinium-vilanterol  1 puff Inhalation Daily   Continuous Infusions:   PRN Meds:.acetaminophen  **OR** acetaminophen , albuterol , ondansetron  **OR** ondansetron  (ZOFRAN ) IV, oxyCODONE        Objective:   Vitals:   05/02/24 0612 05/02/24 0808 05/02/24 0828 05/02/24 1220  BP: 129/83  (!) 138/105 94/74  Pulse:  85 85 92  Resp:  11  (!) 9  Temp:   97.9 F (36.6 C) 98.5 F (36.9 C)  TempSrc:   Oral Oral  SpO2:  96% 95% 96%  Weight:      Height:        Wt Readings from Last 3 Encounters:  04/25/24 56.7 kg  04/02/24 59 kg  10/09/23 63.1 kg     Intake/Output Summary (Last 24 hours) at 05/02/2024 1322 Last data filed at 05/01/2024 2014 Gross per 24 hour  Intake 240 ml  Output 700 ml  Net -460 ml     Physical Exam  Awake, alert, no apparent distress, frail, chronically ill-appearing Awake Alert, Oriented X 3, No new F.N deficits, Normal affect Symmetrical Chest wall movement, Good air movement bilaterally, CTAB irregular +ve B.Sounds, Abd Soft, No tenderness, No rebound - guarding or rigidity. No Cyanosis, Clubbing or edema, No new Rash or bruise           Data Review:    Recent Labs  Lab 04/27/24 1252 04/28/24 1124 04/29/24 1431 04/30/24 0821 05/01/24 0649  WBC 10.5 8.1 7.5 7.4 6.9  HGB 9.5* 10.0* 10.2* 10.2* 11.0*  HCT 30.7* 31.8* 32.6* 32.2* 35.0*  PLT 164 176 227 179 199  MCV 72.4* 71.1* 72.0* 70.9* 71.6*  MCH 22.4* 22.4* 22.5* 22.5* 22.5*  MCHC 30.9 31.4  31.3 31.7 31.4  RDW 17.1* 16.9* 17.1* 17.0* 17.1*  LYMPHSABS 2.8 2.2 1.9 1.9 2.1  MONOABS 0.8 0.6 0.7 0.8 0.7  EOSABS 0.3 0.4 0.3 0.4 0.3  BASOSABS 0.1 0.1 0.1 0.1 0.1    Recent Labs  Lab 04/25/24 1613 04/25/24 1623 04/25/24 2000 04/25/24 2309 04/26/24 0156 04/26/24 0337 04/26/24 0850 04/27/24 1252 04/28/24 1124 04/29/24 0835 04/30/24 0821 05/01/24 0649  NA 143 145  --   --   --    < >  --  142 143  143 141 143 143  K 3.7 3.6  --   --   --    < >  --  3.7 3.6  3.5 4.7 4.6 4.6  CL 109 113*  --   --   --    < >  --  113* 112*  111 108 108 103  CO2 16*  --   --   --   --    < >  --  20* 24  24 22 23 27   ANIONGAP 18*  --   --   --   --    < >  --  9 7  8 11 12 13   GLUCOSE 168* 166*  --   --   --    < >  --  92 92  93 73 81 91  BUN 28* 28*  --   --   --    < >  --  23 17  16 16 12 10   CREATININE 1.35* 1.20*  --   --   --    < >  --  1.00 0.89  0.88 0.78 0.89 0.76  AST 72*  --   --   --   --    < >  --  87* 79*  77* 58* 40 37  ALT 48*  --   --   --   --    < >  --  58* 68*  69* 65* 51* 49*  ALKPHOS 69  --   --   --   --    < >  --  68 69  65 70 66 71  BILITOT 1.4*  --   --   --   --    < >  --  0.5 0.5  0.3 0.8 0.6 0.8  ALBUMIN 4.0  --   --   --   --    < >  --  3.0* 3.0*  3.1* 3.3* 2.9* 3.4*  CRP  --   --   --   --   --   --   --  1.0* 0.9 1.0* 0.8 0.7  PROCALCITON  --   --    < >  --   --   --   --  13.50 6.69 4.30 2.45 1.28  LATICACIDVEN  --  5.4*  --  3.3* 3.2*  --  2.9*  --   --   --   --   --   INR 2.6*  --   --   --   --   --   --   --   --   --   --   --   AMMONIA  --   --   --   --   --   --   --  53* 35  --   --   --   MG  --   --   --   --   --   --   --  1.5* 2.0  2.1 1.9 1.8 2.1  PHOS  --   --   --   --   --   --   --   --  2.3*  2.2* 3.4 3.1 3.8  CALCIUM  9.4  --   --   --   --    < >  --  8.7* 8.7*  8.7* 9.0 9.0 9.6   < > = values in this interval not displayed.      Recent Labs  Lab 04/25/24 1613 04/25/24 1623 04/25/24 2000 04/25/24 2309  04/26/24 0156 04/26/24 9662 04/26/24 0850 04/27/24 1252 04/28/24 1124 04/29/24 0835 04/30/24 0821 05/01/24 0649  CRP  --   --   --   --   --   --   --  1.0* 0.9 1.0* 0.8 0.7  PROCALCITON  --   --    < >  --   --   --   --  13.50 6.69 4.30 2.45 1.28  LATICACIDVEN  --  5.4*  --  3.3* 3.2*  --  2.9*  --   --   --   --   --   INR 2.6*  --   --   --   --   --   --   --   --   --   --   --  AMMONIA  --   --   --   --   --   --   --  53* 35  --   --   --   MG  --   --   --   --   --   --   --  1.5* 2.0  2.1 1.9 1.8 2.1  CALCIUM  9.4  --   --   --   --    < >  --  8.7* 8.7*  8.7* 9.0 9.0 9.6   < > = values in this interval not displayed.    --------------------------------------------------------------------------------------------------------------- Lab Results  Component Value Date   CHOL 118 04/02/2024   HDL 52 04/02/2024   LDLCALC 48 04/02/2024   TRIG 92 04/02/2024   CHOLHDL 2.3 04/02/2024    Lab Results  Component Value Date   HGBA1C 6.2 (H) 04/02/2024   No results for input(s): TSH, T4TOTAL, FREET4, T3FREE, THYROIDAB in the last 72 hours. No results for input(s): VITAMINB12, FOLATE, FERRITIN, TIBC, IRON, RETICCTPCT in the last 72 hours. ------------------------------------------------------------------------------------------------------------------ Cardiac Enzymes Recent Labs  Lab 04/25/24 1613  CKMB 26.9*    Micro Results Recent Results (from the past 240 hours)  Culture, blood (single)     Status: None   Collection Time: 04/25/24  6:32 PM   Specimen: BLOOD RIGHT ARM  Result Value Ref Range Status   Specimen Description BLOOD RIGHT ARM  Final   Special Requests   Final    BOTTLES DRAWN AEROBIC AND ANAEROBIC Blood Culture adequate volume   Culture   Final    NO GROWTH 5 DAYS Performed at [facility], [address], [place], [place] [address]    Report Status 04/30/2024 FINAL  Final  Resp panel by RT-PCR (RSV, Flu A&B, Covid)  Anterior Nasal Swab     Status: None   Collection Time: 04/25/24 11:09 PM   Specimen: Anterior Nasal Swab  Result Value Ref Range Status   SARS Coronavirus 2 by RT PCR NEGATIVE NEGATIVE Final   Influenza A by PCR NEGATIVE NEGATIVE Final   Influenza B by PCR NEGATIVE NEGATIVE Final    Comment: (NOTE) The Xpert Xpress SARS-CoV-2/FLU/RSV plus assay is intended as an aid in the diagnosis of influenza from Nasopharyngeal swab specimens and should not be used as a sole basis for treatment. Nasal washings and aspirates are unacceptable for Xpert Xpress SARS-CoV-2/FLU/RSV testing.  Fact Sheet for Patients: BloggerCourse.com  Fact Sheet for Healthcare Providers: SeriousBroker.it  This test is not yet approved or cleared by the [place]  FDA and has been authorized for detection and/or diagnosis of SARS-CoV-2 by FDA under an Emergency Use Authorization (EUA). This EUA will remain in effect (meaning this test can be used) for the duration of the COVID-19 declaration under Section 564(b)(1) of the Act, 21 U.S.C. section 360bbb-3(b)(1), unless the authorization is terminated or revoked.     Resp Syncytial Virus by PCR NEGATIVE NEGATIVE Final    Comment: (NOTE) Fact Sheet for Patients: BloggerCourse.com  Fact Sheet for Healthcare Providers: SeriousBroker.it  This test is not yet approved or cleared by the [place]  FDA and has been authorized for detection and/or diagnosis of SARS-CoV-2 by FDA under an Emergency Use Authorization (EUA). This EUA will remain in effect (meaning this test can be used) for the duration of the COVID-19 declaration under Section 564(b)(1) of the Act, 21 U.S.C. section 360bbb-3(b)(1), unless the authorization is terminated or revoked.  Performed at [facility],  [address], [place], [place] [address]   Respiratory (~20 pathogens) panel by PCR      Status: Abnormal   Collection Time: 04/27/24  7:04 AM   Specimen: Nasopharyngeal Swab; Respiratory  Result Value Ref Range Status   Adenovirus NOT DETECTED NOT DETECTED Final   Coronavirus 229E NOT DETECTED NOT DETECTED Final    Comment: (NOTE) The Coronavirus on the Respiratory Panel, DOES NOT test for the novel  Coronavirus (2019 nCoV)    Coronavirus HKU1 NOT DETECTED NOT DETECTED Final   Coronavirus NL63 NOT DETECTED NOT DETECTED Final   Coronavirus OC43 NOT DETECTED NOT DETECTED Final   Metapneumovirus NOT DETECTED NOT DETECTED Final   Rhinovirus / Enterovirus DETECTED (A) NOT DETECTED Final   Influenza A NOT DETECTED NOT DETECTED Final   Influenza B NOT DETECTED NOT DETECTED Final   Parainfluenza Virus 1 NOT DETECTED NOT DETECTED Final   Parainfluenza Virus 2 NOT DETECTED NOT DETECTED Final   Parainfluenza Virus 3 NOT DETECTED NOT DETECTED Final   Parainfluenza Virus 4 NOT DETECTED NOT DETECTED Final   Respiratory Syncytial Virus NOT DETECTED NOT DETECTED Final   Bordetella pertussis NOT DETECTED NOT DETECTED Final   Bordetella Parapertussis NOT DETECTED NOT DETECTED Final   Chlamydophila pneumoniae NOT DETECTED NOT DETECTED Final   Mycoplasma pneumoniae NOT DETECTED NOT DETECTED Final    Comment: Performed at [facility], [address] [place], [place] [address]  MRSA Next Gen by PCR, Nasal     Status: None   Collection Time: 04/27/24 10:15 AM   Specimen: Nasal Mucosa; Nasal Swab  Result Value Ref Range Status   MRSA by PCR Next Gen NOT DETECTED NOT DETECTED Final    Comment: (NOTE) The GeneXpert MRSA Assay (FDA approved for NASAL specimens only), is one component of a comprehensive MRSA colonization surveillance program. It is not intended to diagnose MRSA infection nor to guide or monitor treatment for MRSA infections. Test performance is not FDA approved in patients less than 71 years old. Performed at [facility], [address], [place],  [place] [address]     Radiology Report No results found.    Signature  -   Brayton Lye M.D on 05/02/2024 at 1:22 PM   -  To page go to [email or web address]

## 2024-05-02 NOTE — Plan of Care (Signed)
 Ted hose placed on patient per order. Pt OOB to chair x 1-2 assist with walker. Pt sat in chair for 1 hr before requesting to go back to bed.

## 2024-05-02 NOTE — Progress Notes (Signed)
 Physical Therapy Treatment Patient Details Name: Kimberly [name] MRN: [id number] DOB: 06-04-1956 Today's Date: 05/02/2024   History of Present Illness Kimberly [name] is a 68 y.o. female admitted 04/25/24 for near syncope and fall where she was on the ground for ~4 hours. Work-up revealed sepsis of unknown etiology, NSTEMI, and rhabdomyolysis. CT demonstrated compression fracture of T8 vertebral body of indeterminate age. 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    PT Comments  Pt slowly progressing towards goals.  Min A +2 for bed mobility, sit to stand and 30 ft of gait with RW. Pt has a high fear of falling and is limited by back pain with TLSO. Due to pt current functional status, home set up and available assistance at home recommending skilled physical therapy services < 3 hours/day in order to address strength, balance and functional mobility to decrease risk for falls, injury, immobility, skin break down and re-hospitalization.     If plan is discharge home, recommend the following: Assistance with cooking/housework;Assist for transportation;Help with stairs or ramp for entrance;A lot of help with walking and/or transfers   Can travel by private vehicle     No  Equipment Recommendations  Hospital bed;Hoyer lift       Precautions / Restrictions Precautions Precautions: Fall Recall of Precautions/Restrictions: Impaired Precaution/Restrictions Comments: Followed back precautions d/t pain and T8 compression fx. Educated pt on log roll technique and brace. Required Braces or Orthoses: Spinal Brace Spinal Brace: Thoracolumbosacral orthotic;Applied in sitting position Restrictions Weight Bearing Restrictions Per Provider Order: No     Mobility  Bed Mobility Overal bed mobility: Needs Assistance        Supine to sit: Min assist, +2 for safety/equipment, Used rails Sit to supine: Min assist, Used rails   General bed mobility comments: Min A +2 for safety and scooting to EOB; verbal/tactile cues for sequencing.  Min A for guidance back to supine with CGA at LE and at trunk for MIn A    Transfers Overall transfer level: Needs assistance Equipment used: Rolling walker (2 wheels) Transfers: Sit to/from Stand Sit to Stand: +2 physical assistance, Contact guard assist, Min assist           General transfer comment: Min A +2 for safety and fear of falling for sit to stand from EOB; verbal cues for hand placement. Once in standing pt required increased to MIn A due to posterior lean and difficutly with foot placement required multi modal cues for balance getting COM over BOS    Ambulation/Gait Ambulation/Gait assistance: Min assist, +2 safety/equipment Gait Distance (Feet): 30 Feet Assistive device: Rolling walker (2 wheels) Gait Pattern/deviations: Step-through pattern, Knee hyperextension - left, Decreased stance time - left Gait velocity: decreased Gait velocity interpretation: <1.31 ft/sec, indicative of household ambulator   General Gait Details: Extension intermittently with LUE requiring assist to keep AD close to COM, PT with partial step through to short step through gait pattern with hyperextension on the L. Verbal cues for improved posture during gait, body proximity to AD and low foot clearance.      Balance Overall balance assessment: Needs assistance Sitting-balance support: Bilateral upper extremity supported, Feet supported Sitting balance-Leahy Scale: Fair Sitting balance - Comments: Pt sat EOB with SBA   Standing balance support: Bilateral upper extremity supported, During functional activity, Reliant on assistive device for balance Standing balance-Leahy Scale:  Poor Standing balance comment: Pt dependent on RW intermittent External assist        Communication  Communication Communication: Impaired Factors Affecting Communication: Hearing impaired  Cognition   Behavior During Therapy: WFL for tasks assessed/performed   PT - Cognitive impairments: No family/caregiver present to determine baseline     PT - Cognition Comments: Pt A,Ox4` Following commands: Intact      Cueing Cueing Techniques: Verbal cues, Gestural cues     General Comments General comments (skin integrity, edema, etc.): BP 148/95      Pertinent Vitals/Pain Pain Assessment Pain Assessment: Faces Faces Pain Scale: Hurts even more Pain Location: back and inner thighs Pain Descriptors / Indicators: Grimacing, Guarding, Discomfort, Aching Pain Intervention(s): Limited activity within patient's tolerance, Monitored during session, Repositioned     PT Goals (current goals can now be found in the care plan section) Acute Rehab PT Goals Patient Stated Goal: Have less pain and return home PT Goal Formulation: With patient Time For Goal Achievement: 05/11/24 Potential to Achieve Goals: Fair Progress towards PT goals: Progressing toward goals    Frequency    Min 2X/week      PT Plan  Continue with current POC        AM-PAC PT 6 Clicks Mobility   Outcome Measure  Help needed turning from your back to your side while in a flat bed without using bedrails?: A Lot Help needed moving from lying on your back to sitting on the side of a flat bed without using bedrails?: A Lot Help needed moving to and from a bed to a chair (including a wheelchair)?: A Lot Help needed standing up from a chair using your arms (e.g., wheelchair or bedside chair)?: A Lot Help needed to walk in hospital room?: A Lot Help needed climbing 3-5 steps with a railing? : Total 6 Click Score: 11    End of Session Equipment Utilized During Treatment: Gait belt;Back brace Activity Tolerance: Patient tolerated treatment well Patient left: in bed;with call bell/phone within reach;with bed alarm  set Nurse Communication: Mobility status PT Visit Diagnosis: Pain;Muscle weakness (generalized) (M62.81) Pain - part of body:  (back pain)     Time: 1030-1053 PT Time Calculation (min) (ACUTE ONLY): 23 min  Charges:    $Therapeutic Activity: 23-37 mins PT General Charges $$ ACUTE PT VISIT: 1 Visit                    Dorothyann Maier, DPT, CLT  Acute Rehabilitation Services Office: [phone] (Secure chat preferred)   Dorothyann VEAR Maier 05/02/2024, 11:04 AM

## 2024-05-02 NOTE — Care Management Important Message (Signed)
 Important Message  Patient Details  Name: Kimberly [name] MRN: [id number] Date of Birth: [birth year]   Important Message Given:  Yes - Medicare IM     Claretta Deed 05/02/2024, 4:10 PM

## 2024-05-02 NOTE — Plan of Care (Signed)

## 2024-05-02 NOTE — Plan of Care (Signed)
  Problem: Education: Goal: Knowledge of General Education information will improve Description: Including pain rating scale, medication(s)/side effects and non-pharmacologic comfort measures Outcome: Progressing   Problem: Clinical Measurements: Goal: Ability to maintain clinical measurements within normal limits will improve Outcome: Progressing Goal: Will remain free from infection Outcome: Progressing Goal: Diagnostic test results will improve Outcome: Progressing Goal: Respiratory complications will improve Outcome: Progressing Goal: Cardiovascular complication will be avoided Outcome: Progressing   Problem: Nutrition: Goal: Adequate nutrition will be maintained Outcome: Progressing   Problem: Coping: Goal: Level of anxiety will decrease Outcome: Progressing   Problem: Elimination: Goal: Will not experience complications related to urinary retention Outcome: Progressing   Problem: Pain Managment: Goal: General experience of comfort will improve and/or be controlled Outcome: Progressing   Problem: Safety: Goal: Ability to remain free from injury will improve Outcome: Progressing   Problem: Skin Integrity: Goal: Risk for impaired skin integrity will decrease Outcome: Progressing

## 2024-05-02 NOTE — Progress Notes (Signed)
 Progress Note  Patient Name: Kimberly [name] Date of Encounter: 05/02/2024  Primary Cardiologist:   Lynwood Schilling, MD   Subjective   She denies new SOB or pain.  She did work with PT.   Inpatient Medications    Scheduled Meds:  apixaban   5 mg Oral BID   aspirin  EC  81 mg Oral Daily   calcium -vitamin D   1 tablet Oral BID WC   carvedilol   25 mg Oral BID WC   colchicine   0.6 mg Oral Once   docusate sodium   200 mg Oral BID   hydrALAZINE   10 mg Oral Q8H   isosorbide  mononitrate  30 mg Oral Daily   neomycin -bacitracin -polymyxin  1 Application Apply externally BID   pantoprazole   40 mg Oral Daily   polyethylene glycol  17 g Oral BID   umeclidinium-vilanterol  1 puff Inhalation Daily   Continuous Infusions:  PRN Meds: acetaminophen  **OR** acetaminophen , albuterol , ondansetron  **OR** ondansetron  (ZOFRAN ) IV, oxyCODONE    Vital Signs    Vitals:   05/01/24 2014 05/02/24 0033 05/02/24 0431 05/02/24 0612  BP: 108/83 (!) 124/92 129/83 129/83  Pulse:  85 88   Resp:  10 17   Temp:  98.6 F (37 C) 98.6 F (37 C)   TempSrc:  Axillary Axillary   SpO2:  96% 96%   Weight:      Height:        Intake/Output Summary (Last 24 hours) at 05/02/2024 0747 Last data filed at 05/01/2024 2014 Gross per 24 hour  Intake 240 ml  Output 700 ml  Net -460 ml   Filed Weights   04/25/24 1625  Weight: 56.7 kg    Telemetry    Atrial fib with controlled rate - Personally Reviewed  ECG    NA - Personally Reviewed  Physical Exam   GEN: No acute distress.   Neck: No  JVD Cardiac: Irregular RR, 2/6 apical systolic murmur, no diastolic murmurs, rubs, or gallops.  Respiratory: Clear  to auscultation bilaterally. GI: Soft, nontender, non-distended  MS: No  edema; No deformity. Neuro:  Dense hemiparesis Psych: Normal affect   Labs    Chemistry Recent Labs  Lab 04/29/24 0835 04/30/24 0821 05/01/24 0649  NA 141 143 143  K 4.7 4.6 4.6  CL 108 108 103  CO2 22 23 27   GLUCOSE  73 81 91  BUN 16 12 10   CREATININE 0.78 0.89 0.76  CALCIUM  9.0 9.0 9.6  PROT 6.4* 5.7* 6.5  ALBUMIN 3.3* 2.9* 3.4*  AST 58* 40 37  ALT 65* 51* 49*  ALKPHOS 70 66 71  BILITOT 0.8 0.6 0.8  GFRNONAA >60 >60 >60  ANIONGAP 11 12 13      Hematology Recent Labs  Lab 04/29/24 1431 04/30/24 0821 05/01/24 0649  WBC 7.5 7.4 6.9  RBC 4.53 4.54 4.89  HGB 10.2* 10.2* 11.0*  HCT 32.6* 32.2* 35.0*  MCV 72.0* 70.9* 71.6*  MCH 22.5* 22.5* 22.5*  MCHC 31.3 31.7 31.4  RDW 17.1* 17.0* 17.1*  PLT 227 179 199    Cardiac EnzymesNo results for input(s): TROPONINI in the last 168 hours. No results for input(s): TROPIPOC in the last 168 hours.   BNPNo results for input(s): BNP, PROBNP in the last 168 hours.   DDimer No results for input(s): DDIMER in the last 168 hours.   Radiology    No results found.  Cardiac Studies    Echo 04/1424  1. Left ventricular ejection fraction, by estimation, is 45 to 50%.  The  left ventricle has mildly decreased function. The left ventricle  demonstrates regional wall motion abnormalities (see scoring  diagram/findings for description). There is mild left  ventricular hypertrophy. Left ventricular diastolic parameters are  indeterminate.   2. Right ventricular systolic function is mildly reduced. The right  ventricular size is normal. There is normal pulmonary artery systolic  pressure. The estimated right ventricular systolic pressure is 26.8 mmHg.   3. Left atrial size was severely dilated.   4. The mitral valve is abnormal. Moderate to severe mitral valve  regurgitation. Visually MR appears moderate, but may be underestimating as  splay artifact is seen, though low E wave velocity argues against severe  MR   5. The aortic valve was not well visualized. Aortic valve regurgitation  is not visualized. No aortic stenosis is present.   6. Aortic dilatation noted. Aneurysm of the ascending aorta, measuring 47  mm.   7. The inferior vena cava is  dilated in size with >50% respiratory  variability, suggesting right atrial pressure of 8 mmHg.    Patient Profile     68 y.o. 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   Assessment & Plan    Elevated troponin:  History of CABG.  Suspect demand ischemia.  EF is mildly reduced with global hypokinesis.  Not an invasive evaluation candidate.  She does have T wave inversion and newly reduced EF with regional wall motion consistent with previous infarct as identified on previous perfusion study.   Medical management.     Cardiomyopathy:  I started hydralazine  and nitrates.  However, I think that is is reasonable to try Entresto  now that AKI is resolved.  Watch creat closely.     HTN:  This is being managed in the context of treating his CHF    MR:  Moderate to severe. Will repeat echo as an out patient.      AKI:   Resolved.  See above.    Prolonged QT:  Avoid QT prolonging meds.       PAF:  Rate is controlled.  She is tolerating anticoagulatoin.      For questions or updates, please contact [facility] Please consult [email or web address] for contact info under Cardiology/STEMI.   Signed, Lynwood Schilling, MD  05/02/2024, 7:47 AM

## 2024-05-02 NOTE — TOC Progression Note (Signed)
 Transition of Care [facility]) - Progression Note    Patient Details  Name: CAMERON KATAYAMA MRN: [id number] Date of Birth: 1956/03/05  Transition [facility]) CM/SW Contact  Inocente GORMAN Kindle, LCSW Phone Number: 05/02/2024, 4:48 PM  Clinical Narrative:    [facility] able to accept patient tomorrow pending insurance approval. CSW updated patient's sister who reported agreement with plan and need for PTAR. She requested CSW let her know discharge time.   CSW submitted clinicals to insurance, Ref# [id number].    Expected Discharge Plan: Skilled Nursing Facility Barriers to Discharge: Continued Medical Work up, English as a second language teacher, SNF Pending bed offer               Expected Discharge Plan and Services In-house Referral: Clinical Social Work   Post Acute Care Choice: Skilled Nursing Facility Living arrangements for the past 2 months: Single Family Home                                       Social Drivers of Health (SDOH) Interventions SDOH Screenings   Food Insecurity: No Food Insecurity (04/26/2024)  Housing: Low Risk  (04/26/2024)  Transportation Needs: No Transportation Needs (04/26/2024)  Utilities: At Risk (04/26/2024)  Alcohol Screen: Low Risk  (09/21/2023)  Depression (PHQ2-9): Low Risk  (04/02/2024)  Financial Resource Strain: Low Risk  (09/21/2023)  Physical Activity: Unknown (09/21/2023)  Social Connections: Socially Isolated (04/26/2024)  Stress: No Stress Concern Present (09/21/2023)  Tobacco Use: Medium Risk (04/25/2024)  Health Literacy: Inadequate Health Literacy (09/21/2023)    Readmission Risk Interventions     No data to display

## 2024-05-03 DIAGNOSIS — K219 Gastro-esophageal reflux disease without esophagitis: Secondary | ICD-10-CM | POA: Diagnosis not present

## 2024-05-03 DIAGNOSIS — E872 Acidosis, unspecified: Secondary | ICD-10-CM | POA: Diagnosis not present

## 2024-05-03 DIAGNOSIS — I509 Heart failure, unspecified: Secondary | ICD-10-CM | POA: Diagnosis not present

## 2024-05-03 DIAGNOSIS — Z7401 Bed confinement status: Secondary | ICD-10-CM | POA: Diagnosis not present

## 2024-05-03 DIAGNOSIS — I340 Nonrheumatic mitral (valve) insufficiency: Secondary | ICD-10-CM | POA: Diagnosis not present

## 2024-05-03 DIAGNOSIS — I679 Cerebrovascular disease, unspecified: Secondary | ICD-10-CM | POA: Diagnosis not present

## 2024-05-03 DIAGNOSIS — I2510 Atherosclerotic heart disease of native coronary artery without angina pectoris: Secondary | ICD-10-CM | POA: Diagnosis not present

## 2024-05-03 DIAGNOSIS — J40 Bronchitis, not specified as acute or chronic: Secondary | ICD-10-CM | POA: Diagnosis not present

## 2024-05-03 DIAGNOSIS — D500 Iron deficiency anemia secondary to blood loss (chronic): Secondary | ICD-10-CM | POA: Diagnosis not present

## 2024-05-03 DIAGNOSIS — R55 Syncope and collapse: Secondary | ICD-10-CM | POA: Diagnosis not present

## 2024-05-03 DIAGNOSIS — J449 Chronic obstructive pulmonary disease, unspecified: Secondary | ICD-10-CM | POA: Diagnosis not present

## 2024-05-03 DIAGNOSIS — E785 Hyperlipidemia, unspecified: Secondary | ICD-10-CM | POA: Diagnosis not present

## 2024-05-03 DIAGNOSIS — W19XXXA Unspecified fall, initial encounter: Secondary | ICD-10-CM | POA: Diagnosis not present

## 2024-05-03 DIAGNOSIS — I69354 Hemiplegia and hemiparesis following cerebral infarction affecting left non-dominant side: Secondary | ICD-10-CM | POA: Diagnosis not present

## 2024-05-03 DIAGNOSIS — R404 Transient alteration of awareness: Secondary | ICD-10-CM | POA: Diagnosis not present

## 2024-05-03 DIAGNOSIS — R7989 Other specified abnormal findings of blood chemistry: Secondary | ICD-10-CM | POA: Diagnosis not present

## 2024-05-03 DIAGNOSIS — S22060D Wedge compression fracture of T7-T8 vertebra, subsequent encounter for fracture with routine healing: Secondary | ICD-10-CM | POA: Diagnosis not present

## 2024-05-03 DIAGNOSIS — R2689 Other abnormalities of gait and mobility: Secondary | ICD-10-CM | POA: Diagnosis not present

## 2024-05-03 DIAGNOSIS — Z8619 Personal history of other infectious and parasitic diseases: Secondary | ICD-10-CM | POA: Diagnosis not present

## 2024-05-03 DIAGNOSIS — I10 Essential (primary) hypertension: Secondary | ICD-10-CM | POA: Diagnosis not present

## 2024-05-03 DIAGNOSIS — Z743 Need for continuous supervision: Secondary | ICD-10-CM | POA: Diagnosis not present

## 2024-05-03 DIAGNOSIS — Z993 Dependence on wheelchair: Secondary | ICD-10-CM | POA: Diagnosis not present

## 2024-05-03 DIAGNOSIS — I25110 Atherosclerotic heart disease of native coronary artery with unstable angina pectoris: Secondary | ICD-10-CM | POA: Diagnosis not present

## 2024-05-03 DIAGNOSIS — I480 Paroxysmal atrial fibrillation: Secondary | ICD-10-CM | POA: Diagnosis not present

## 2024-05-03 DIAGNOSIS — G8190 Hemiplegia, unspecified affecting unspecified side: Secondary | ICD-10-CM | POA: Diagnosis not present

## 2024-05-03 DIAGNOSIS — M6281 Muscle weakness (generalized): Secondary | ICD-10-CM | POA: Diagnosis not present

## 2024-05-03 DIAGNOSIS — I421 Obstructive hypertrophic cardiomyopathy: Secondary | ICD-10-CM | POA: Diagnosis not present

## 2024-05-03 MED ORDER — OXYCODONE HCL 5 MG PO TABS: 5.0000 mg | ORAL_TABLET | Freq: Four times a day (QID) | ORAL | 0 refills | Status: AC | PRN

## 2024-05-03 MED ORDER — CARVEDILOL 25 MG PO TABS: 25.0000 mg | ORAL_TABLET | Freq: Two times a day (BID) | ORAL | Status: DC

## 2024-05-03 MED ORDER — POLYETHYLENE GLYCOL 3350 17 G PO PACK: 17.0000 g | PACK | Freq: Every day | ORAL | Status: AC

## 2024-05-03 MED ORDER — OYSTER SHELL CALCIUM/D3 500-5 MG-MCG PO TABS: 1.0000 | ORAL_TABLET | Freq: Two times a day (BID) | ORAL | Status: AC

## 2024-05-03 MED ORDER — SACUBITRIL-VALSARTAN 24-26 MG PO TABS: 1.0000 | ORAL_TABLET | Freq: Two times a day (BID) | ORAL | Status: AC

## 2024-05-03 MED ORDER — ASPIRIN 81 MG PO TBEC: 81.0000 mg | DELAYED_RELEASE_TABLET | Freq: Every day | ORAL | Status: DC

## 2024-05-03 MED ORDER — DOCUSATE SODIUM 100 MG PO CAPS: 200.0000 mg | ORAL_CAPSULE | Freq: Two times a day (BID) | ORAL | Status: DC

## 2024-05-03 NOTE — TOC Progression Note (Addendum)
 Transition of Care [facility]) - Progression Note    Patient Details  Name: Kimberly [name] MRN: [id number] Date of Birth: [birth year]  Transition [facility]) CM/SW Contact  Inocente GORMAN Kindle, LCSW Phone Number: 05/03/2024, 8:31 AM  Clinical Narrative:    Insurance approval received for [place], Ref# [id number], Auth ID #[id number], effective 05/03/2024-05/06/2024. Updated Logan with [facility] of discharge today and she requested DC summary be faxed to [phone].  CSW updated patient's sister.     Expected Discharge Plan: Skilled Nursing Facility Barriers to Discharge: Continued Medical Work up, English as a second language teacher, SNF Pending bed offer               Expected Discharge Plan and Services In-house Referral: Clinical Social Work   Post Acute Care Choice: Skilled Nursing Facility Living arrangements for the past 2 months: Single Family Home                                       Social Drivers of Health (SDOH) Interventions SDOH Screenings   Food Insecurity: No Food Insecurity (04/26/2024)  Housing: Low Risk  (04/26/2024)  Transportation Needs: No Transportation Needs (04/26/2024)  Utilities: At Risk (04/26/2024)  Alcohol Screen: Low Risk  (09/21/2023)  Depression (PHQ2-9): Low Risk  (04/02/2024)  Financial Resource Strain: Low Risk  (09/21/2023)  Physical Activity: Unknown (09/21/2023)  Social Connections: Socially Isolated (04/26/2024)  Stress: No Stress Concern Present (09/21/2023)  Tobacco Use: Medium Risk (04/25/2024)  Health Literacy: Inadequate Health Literacy (09/21/2023)    Readmission Risk Interventions     No data to display

## 2024-05-03 NOTE — TOC Transition Note (Addendum)
 Transition of Care [facility]) - Discharge Note   Patient Details  Name: Kimberly [name] MRN: [id number] Date of Birth: 10-31-1955  [facility]) CM/SW Contact:  Isaiah Public, LCSWA Phone Number: 05/03/2024, 11:27 AM   Clinical Narrative:     Patient will DC to: [facility] SNF   Anticipated DC date: 05/03/2024  Family notified: [name]  Transport by: [facility]  ?  Per MD patient ready for DC to [facility] . RN, patient, patient's family, and facility notified of DC. Discharge Summary sent to facility. RN given number for report [phone], RM# [phone]. [facility] with Facility informed CSW they will have a private room for patient on Monday.DC packet on chart. Ambulance transport requested for patient.  CSW signing off.    Final next level of care: Skilled Nursing Facility Barriers to Discharge: No Barriers Identified   Patient Goals and CMS Choice Patient states their goals for this hospitalization and ongoing recovery are:: SNF CMS Medicare.gov Compare Post Acute Care list provided to:: Patient Choice offered to / list presented to : Patient, Sibling [place] ownership interest in [facility].provided to:: Patient    Discharge Placement              Patient chooses bed at: [facility] Patient to be transferred to facility by: PTAR Name of family member notified: [name] Patient and family notified of of transfer: 05/03/24  Discharge Plan and Services Additional resources added to the After Visit Summary for   In-house Referral: Clinical Social Work   Post Acute Care Choice: Skilled Nursing Facility                               Social Drivers of Health (SDOH) Interventions SDOH Screenings   Food Insecurity: No Food Insecurity (04/26/2024)  Housing: Low Risk  (04/26/2024)  Transportation Needs: No Transportation Needs (04/26/2024)  Utilities: At Risk (04/26/2024)  Alcohol Screen: Low Risk  (09/21/2023)  Depression (PHQ2-9): Low Risk   (04/02/2024)  Financial Resource Strain: Low Risk  (09/21/2023)  Physical Activity: Unknown (09/21/2023)  Social Connections: Socially Isolated (04/26/2024)  Stress: No Stress Concern Present (09/21/2023)  Tobacco Use: Medium Risk (04/25/2024)  Health Literacy: Inadequate Health Literacy (09/21/2023)     Readmission Risk Interventions     No data to display

## 2024-05-03 NOTE — Discharge Summary (Signed)
 Physician Discharge Summary  Kimberly [name] FMW:[id number] DOB: [birth year] DOA: 04/25/2024  PCP: Georgina Speaks, FNP  Admit date: 04/25/2024 Discharge date: 05/03/2024  Admitted From: (Home) Disposition:  (SNF)  Recommendations for Outpatient Follow-up:  Continue with TLSO brace activity and sitting and standing. Monitor renal function closely as started on Presto Statin on hold, this can be resumed in couple weeks once her rhabdo has totally resolved   Diet recommendation: Heart Healthy   Brief/Interim Summary:  68 y.o. 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   Patient is found to have elevated white count, tachycardia, lactic acidosis, elevated troponin. Admitted to [facility] service with cardiology evaluation.   Sepsis, POA due to acute bronchitis (likely bacterial) - Patient presented with tachycardia, tachypnea, lactic acidosis, elevated white count.  -COVID respiratory viral panel negative, HIV nonreactive, blood cultures negative.  -  patient mild URI/bronchitis, finished her antibiotics. - Resolved.    Paroxysmal A-fib with RVR  chronic systolic heart failure EF 45% with regional wall motion abnormality  Elevated troponins CAD status post CABG Hypertension -She was seen by cardiology, troponin rise due to demand ischemia from sepsis and atrial fibrillation. history of CABG. Suspect demand ischemia. EF is mildly reduced with global hypokinesis. Not an invasive evaluation candidate. She does have T wave inversion and newly reduced EF with regional wall motion consistent with previous infarct as identified on previous perfusion study.  She is on  Eliquis  at home, was initially on heparin  drip now back on Eliquis . - Continue with medical management, currently on Coreg , started on low-dose hydralazine  and nitrate for afterload load reduction .  Given her AKI has resolved, so she was started on low-dose Entresto  instead. - Further workup per cardiology as an outpatient.  Severe MR - Plan to repeat echo as an outpatient per cardiology   Prolonged QTc - Avoid prolonging agents  Near syncope Status post mechanical fall, T8 compression fracture & Rhabdomyolysis - Patient does have a history of dizziness. Continue telemetry rule out cardiac arrhythmias. Echocardiogram as above.  She is s/p gentle IV hydration.  Trend CK. PT/ OT evaluation, out of bed to chair.  TLSO brace ordered for the T8 compression fracture, CT findings discussed with Dr. Joshua.  No surgical intervention with outpatient follow-up with neurosurgery if desired.  - Will start on calcium  and vitamin D    Acute kidney injury Rhabdomyolysis - resolved..   Chronic right arm pain.  - We have imaged right shoulder, right elbow and right wrist.  Stable.  Patient states this pain has been ongoing for several months.  Could be neuropathic, outpatient follow-up with PCP.     Elevated LFTs - In the setting of sepsis and rhabdomyolysis, pain-free, CT abdomen pelvis stable, trending down - Statin on hold   Discharge Diagnoses:  Principal Problem:   Near syncope Active Problems:   Coronary artery disease due to lipid rich plaque   Elevated troponin   Accident due to mechanical fall without injury   Non-traumatic rhabdomyolysis   Lactic acidosis    Discharge Instructions  Discharge Instructions     Diet - low sodium heart healthy   Complete by: As directed    Discharge instructions   Complete by: As directed    Follow  with Primary MD Georgina Speaks, FNP /SNF physician    Activity: As tolerated with Full fall precautions use walker/cane & assistance as  needed   Disposition SNF   Diet: Heart Healthy  For Heart failure patients - Check your Weight same time everyday, if you gain over 2 pounds, or you develop in leg swelling, experience more shortness of breath or chest pain, call your Primary MD immediately. Follow Cardiac Low Salt Diet and 1.5 lit/day fluid restriction.   On your next visit with your primary care physician please Get Medicines reviewed and adjusted.   Please request your Prim.MD to go over all Hospital Tests and Procedure/Radiological results at the follow up, please get all Hospital records sent to your Prim MD by signing hospital release before you go home.   If you experience worsening of your admission symptoms, develop shortness of breath, life threatening emergency, suicidal or homicidal thoughts you must seek medical attention immediately by calling 911 or calling your MD immediately  if symptoms less severe.  You Must read complete instructions/literature along with all the possible adverse reactions/side effects for all the Medicines you take and that have been prescribed to you. Take any new Medicines after you have completely understood and accpet all the possible adverse reactions/side effects.   Do not drive, operating heavy machinery, perform activities at heights, swimming or participation in water activities or provide baby sitting services if your were admitted for syncope or siezures until you have seen by Primary MD or a Neurologist and advised to do so again.  Do not drive when taking Pain medications.    Do not take more than prescribed Pain, Sleep and Anxiety Medications  Special Instructions: If you have smoked or chewed Tobacco  in the last 2 yrs please stop smoking, stop any regular Alcohol  and or any Recreational drug use.  Wear Seat belts while driving.   Please note  You were cared for by a hospitalist during your hospital stay. If you have any questions about your discharge medications  or the care you received while you were in the hospital after you are discharged, you can call the unit and asked to speak with the hospitalist on call if the hospitalist that took care of you is not available. Once you are discharged, your primary care physician will handle any further medical issues. Please note that NO REFILLS for any discharge medications will be authorized once you are discharged, as it is imperative that you return to your primary care physician (or establish a relationship with a primary care physician if you do not have one) for your aftercare needs so that they can reassess your need for medications and monitor your lab values.   Increase activity slowly   Complete by: As directed       Allergies as of 05/03/2024       Reactions   Catapres -tts [clonidine ] Other (See Comments)   Reaction caused by clonidine  patch but not a direct result of the medication Patch adhesive caused skin tears resulting in scarring   Aldactone  [spironolactone ] Rash        Medication List     STOP taking these medications    amLODipine  5 MG tablet Commonly known as: NORVASC    atorvastatin  80 MG tablet Commonly known as: LIPITOR    NICOTINE  TD       TAKE these medications    ACCRUFeR  30 MG Caps Generic drug: Ferric Maltol  Take 1 capsule (30 mg total) by mouth daily.  albuterol  108 (90 Base) MCG/ACT inhaler Commonly known as: VENTOLIN  HFA INHALE 2 PUFFS BY MOUTH INTO THE LUNGS EVERY 6 HOURS AS NEEDED FOR WHEEZING OR SHORTNESS OF BREATH   alendronate  70 MG tablet Commonly known as: FOSAMAX  TAKE 1 TABLET BY MOUTH EVERY 7 DAYS. Take WITH A full GLASS OF WATER ON EMPTY stomach What changed: See the new instructions.   Anoro Ellipta  62.5-25 MCG/ACT Aepb Generic drug: umeclidinium-vilanterol INHALE 1 PUFF BY MOUTH INTO THE LUNGS ONCE DAILY. RINSE MOUTH AFTER USE   aspirin  EC 81 MG tablet Take 1 tablet (81 mg total) by mouth daily. Swallow whole.   benzonatate  100 MG  capsule Commonly known as: Tessalon  Perles Take 1 capsule (100 mg total) by mouth 3 (three) times daily as needed.   calcium -vitamin D  500-5 MG-MCG tablet Commonly known as: OSCAL WITH D Take 1 tablet by mouth 2 (two) times daily with a meal.   carvedilol  25 MG tablet Commonly known as: COREG  Take 1 tablet (25 mg total) by mouth 2 (two) times daily with a meal. What changed:  medication strength how much to take   docusate sodium  100 MG capsule Commonly known as: COLACE Take 2 capsules (200 mg total) by mouth 2 (two) times daily.   Eliquis  5 MG Tabs tablet Generic drug: apixaban  TAKE 1 TABLET BY MOUTH 2 TIMES DAILY   fluticasone  50 MCG/ACT nasal spray Commonly known as: FLONASE  SPRAY 2 SPRAYS into EACH nostril DAILY What changed: See the new instructions.   hydrocortisone cream 1 % Apply 1 application topically daily as needed for itching.   mirtazapine  15 MG tablet Commonly known as: REMERON  TAKE 1 TABLET BY MOUTH AT BEDTIME   oxyCODONE  5 MG immediate release tablet Commonly known as: Oxy IR/ROXICODONE  Take 1 tablet (5 mg total) by mouth every 6 (six) hours as needed for up to 3 days for moderate pain (pain score 4-6) or severe pain (pain score 7-10).   pantoprazole  40 MG tablet Commonly known as: PROTONIX  TAKE 1 TABLET BY MOUTH EVERY DAY   polyethylene glycol 17 g packet Commonly known as: MIRALAX  / GLYCOLAX  Take 17 g by mouth daily. Hold for diarrhea   sacubitril -valsartan  24-26 MG Commonly known as: ENTRESTO  Take 1 tablet by mouth 2 (two) times daily.   Vitamin D  (Ergocalciferol ) 1.25 MG (50000 UNIT) Caps capsule Commonly known as: DRISDOL  TAKE 1 CAPSULE BY MOUTH TWICE A WEEK What changed: See the new instructions.        Contact information for after-discharge care     Destination     [facility] .   Service: Skilled Nursing Contact information: [address] [place]  [address] [phone]                     Allergies  Allergen Reactions   Catapres -Tts [Clonidine ] Other (See Comments)    Reaction caused by clonidine  patch but not a direct result of the medication Patch adhesive caused skin tears resulting in scarring   Aldactone  [Spironolactone ] Rash    Consultations: Cardiology  Procedures/Studies: CT ELBOW RIGHT WO CONTRAST Result Date: 04/28/2024 CLINICAL DATA:  Elbow pain, chronic, bone abnormality suspected, xray done EXAM: CT OF THE UPPER RIGHT EXTREMITY WITHOUT CONTRAST TECHNIQUE: Multidetector CT imaging of the right elbow was performed according to the standard protocol. RADIATION DOSE REDUCTION: This exam was performed according to the departmental dose-optimization program which includes automated exposure control, adjustment of the mA and/or kV according to patient size and/or use of iterative reconstruction technique.  COMPARISON:  Radiographs 04/28/2024 FINDINGS: Bones/Joint/Cartilage No evidence of acute fracture or dislocation. Minimal spurring of the coronoid process. Minimal joint effusion. No evidence of intra-articular loose body. Ligaments Suboptimally assessed by CT. Muscles and Tendons The elbow muscles and tendons appear unremarkable as evaluated by CT. Soft tissues IV tubing present in the antecubital fossa. Apparent mild subcutaneous edema posterolaterally. No evidence of focal fluid collection, other foreign body or soft tissue emphysema. IMPRESSION: 1. No acute osseous findings or significant arthropathic changes. 2. Minimal joint effusion. 3. Mild subcutaneous edema posterolaterally. No evidence of focal fluid collection, other foreign body or soft tissue emphysema. Electronically Signed   By: Elsie Perone M.D.   On: 04/28/2024 19:00   DG Wrist 2 Views Right Result Date: 04/28/2024 CLINICAL DATA:  Pain. EXAM: RIGHT WRIST - 2 VIEW COMPARISON:  None Available. FINDINGS: There is no evidence of fracture or dislocation. The carpal rows are intact and demonstrate normal  alignment. No significant arthropathy. No significant soft tissue abnormalities are seen. IMPRESSION: No acute osseous abnormality. Electronically Signed   By: Harrietta Sherry M.D.   On: 04/28/2024 10:33   DG Elbow 2 Views Right Result Date: 04/28/2024 CLINICAL DATA:  Right elbow pain. EXAM: RIGHT ELBOW - 2 VIEW COMPARISON:  None Available. FINDINGS: No appreciable acute fracture or dislocation. Borderline prominence of the anterior fat pad, which may reflect an elbow joint effusion. Soft tissue swelling along the medial elbow. IMPRESSION: Borderline prominence of the anterior fat pad may reflect an elbow joint effusion. Occult intra-articular fracture cannot be excluded. Recommend further evaluation with CT. Electronically Signed   By: Harrietta Sherry M.D.   On: 04/28/2024 10:31   DG Shoulder Right Result Date: 04/28/2024 CLINICAL DATA:  Right shoulder pain. EXAM: RIGHT SHOULDER - 2+ VIEW COMPARISON:  None Available. FINDINGS: No acute fracture or dislocation. Mild glenohumeral and acromioclavicular joint osteoarthritis. Prior median sternotomy and CABG. Soft tissues are unremarkable. IMPRESSION: 1. No acute fracture dislocation. 2. Mild glenohumeral and acromioclavicular joint osteoarthritis. Electronically Signed   By: Harrietta Sherry M.D.   On: 04/28/2024 10:24   ECHOCARDIOGRAM COMPLETE Result Date: 04/27/2024    ECHOCARDIOGRAM REPORT   Patient Name:   Kimberly [name] Date of Exam: 04/27/2024 Medical Rec #:  [id number]         Height:       67.0 in Accession #:    [id number]        Weight:       125.0 lb Date of Birth:  1956/03/05        BSA:          1.656 m Patient Age:    67 years          BP:           128/105 mmHg Patient Gender: F                 HR:           119 bpm. Exam Location:  Inpatient Procedure: 2D Echo, Cardiac Doppler and Color Doppler (Both Spectral and Color            Flow Doppler were utilized during procedure). Indications:     NSTEMI I21.4  History:         Patient has prior  history of Echocardiogram examinations, most                  recent 06/23/2022. Hypertrophic Cardiomyopathy, CAD, Prior  CABG, Stroke, COPD and CKD, stage 3, Arrythmias:Atrial                  Fibrillation, Signs/Symptoms:Dyspnea and Shortness of Breath;                  Risk Factors:Hypertension, Dyslipidemia and Current Smoker.  Sonographer:     Thea Norlander RCS Referring Phys:  CAMILA DELENA NED Diagnosing Phys: Lonni Nanas MD IMPRESSIONS  1. Left ventricular ejection fraction, by estimation, is 45 to 50%. The left ventricle has mildly decreased function. The left ventricle demonstrates regional wall motion abnormalities (see scoring diagram/findings for description). There is mild left ventricular hypertrophy. Left ventricular diastolic parameters are indeterminate.  2. Right ventricular systolic function is mildly reduced. The right ventricular size is normal. There is normal pulmonary artery systolic pressure. The estimated right ventricular systolic pressure is 26.8 mmHg.  3. Left atrial size was severely dilated.  4. The mitral valve is abnormal. Moderate to severe mitral valve regurgitation. Visually MR appears moderate, but may be underestimating as splay artifact is seen, though low E wave velocity argues against severe MR  5. The aortic valve was not well visualized. Aortic valve regurgitation is not visualized. No aortic stenosis is present.  6. Aortic dilatation noted. 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  7. The inferior vena cava is dilated in size with >50% respiratory variability, suggesting right atrial pressure of 8 mmHg. FINDINGS  Left Ventricle: Left ventricular ejection fraction, by estimation, is 45 to 50%. The left ventricle has mildly decreased function. The left ventricle demonstrates regional wall motion abnormalities. The left ventricular internal cavity size was normal in size. There is mild left ventricular hypertrophy. Left ventricular  diastolic parameters are indeterminate.  LV Wall Scoring: The basal inferolateral segment is akinetic. The antero-lateral wall and mid inferolateral segment are hypokinetic. The entire anterior wall, entire septum, entire apex, and entire inferior wall are normal. Right Ventricle: The right ventricular size is normal. No increase in right ventricular wall thickness. Right ventricular systolic function is mildly reduced. There is normal pulmonary artery systolic pressure. The tricuspid regurgitant velocity is 2.17 m/s, and with an assumed right atrial pressure of 8 mmHg, the estimated right ventricular systolic pressure is 26.8 mmHg. Left Atrium: Left atrial size was severely dilated. Right Atrium: Right atrial size was normal in size. Pericardium: There is no evidence of pericardial effusion. Mitral Valve: The mitral valve is abnormal. Moderate to severe mitral valve regurgitation. Tricuspid Valve: The tricuspid valve is normal in structure. Tricuspid valve regurgitation is mild. Aortic Valve: The aortic valve was not well visualized. Aortic valve regurgitation is not visualized. No aortic stenosis is present. Aortic valve peak gradient measures 1.6 mmHg. Pulmonic Valve: The pulmonic valve was not well visualized. Pulmonic valve regurgitation is trivial. Aorta: The aortic root is normal in size and structure and aortic dilatation noted. There is an aneurysm involving the ascending aorta measuring 47 mm. Venous: The inferior vena cava is dilated in size with greater than 50% respiratory variability, suggesting right atrial pressure of 8 mmHg. IAS/Shunts: The interatrial septum was not well visualized.  LEFT VENTRICLE PLAX 2D LVIDd:         3.90 cm   Diastology LVIDs:         3.10 cm   LV e' medial:    10.70 cm/s LV PW:         1.00 cm   LV E/e' medial:  8.3 LV IVS:  0.90 cm   LV e' lateral:   15.50 cm/s LVOT diam:     2.00 cm   LV E/e' lateral: 5.7 LV SV:         27 LV SV Index:   16 LVOT Area:     3.14 cm   RIGHT VENTRICLE            IVC RV S prime:     9.01 cm/s  IVC diam: 2.50 cm TAPSE (M-mode): 1.2 cm LEFT ATRIUM             Index        RIGHT ATRIUM           Index LA diam:        3.70 cm 2.23 cm/m   RA Area:     19.60 cm LA Vol (A2C):   64.5 ml 38.95 ml/m  RA Volume:   46.60 ml  28.14 ml/m LA Vol (A4C):   97.5 ml 58.87 ml/m LA Biplane Vol: 81.7 ml 49.33 ml/m  AORTIC VALVE AV Area (Vmax): 2.85 cm AV Vmax:        63.70 cm/s AV Peak Grad:   1.6 mmHg LVOT Vmax:      57.80 cm/s LVOT Vmean:     37.767 cm/s LVOT VTI:       0.086 m  AORTA Ao Root diam: 3.40 cm Ao Asc diam:  4.70 cm MITRAL VALVE               TRICUSPID VALVE MV Area (PHT): 5.88 cm    TR Peak grad:   18.8 mmHg MV Decel Time: 129 msec    TR Vmax:        217.00 cm/s MV E velocity: 89.10 cm/s                            SHUNTS                            Systemic VTI:  0.09 m                            Systemic Diam: 2.00 cm Lonni Nanas MD Electronically signed by Lonni Nanas MD Signature Date/Time: 04/27/2024/6:09:17 PM    Final (Updated)    CT CHEST ABDOMEN PELVIS W CONTRAST Result Date: 04/25/2024 CLINICAL DATA:  Blunt trauma EXAM: CT CHEST, ABDOMEN, AND PELVIS WITH CONTRAST TECHNIQUE: Multidetector CT imaging of the chest, abdomen and pelvis was performed following the standard protocol during bolus administration of intravenous contrast. RADIATION DOSE REDUCTION: This exam was performed according to the departmental dose-optimization program which includes automated exposure control, adjustment of the mA and/or kV according to patient size and/or use of iterative reconstruction technique. CONTRAST:  55mL OMNIPAQUE  IOHEXOL  350 MG/ML SOLN COMPARISON:  Chest radiograph same day, chest CT Dec 26, 2021 FINDINGS: CT CHEST FINDINGS Cardiovascular: The heart size is enlarged. Aneurysmal dilation of ascending aorta measuring 4.3 cm. Enlarged pulmonary artery measuring 3.6 cm. Status post CABG. No pericardial effusion. Atherosclerotic  calcifications of coronary arteries. Mediastinum/Nodes: No suspicious lymphadenopathy. Few subcentimeter bilateral lower paratracheal lymph nodes likely reactive. Lungs/Pleura: Upper lobe predominant centrilobular emphysematous changes. Multi foci scarring/subsegmental atelectasis predominantly involving the right middle lobe and posteromedial left lower lobe likely sequela from chronic infection/inflammation, increased to prior. Progressive fibrotic changes and interlobular septal thickening in peripheral right upper and  lower lobes. No posttraumatic changes of the lungs. There is posterior indentation/septation versus a diverticulum along the left lateral wall of the trachea in thoracic inlet. No pleural effusion. No pneumothorax. Musculoskeletal: No suspicious fracture or lytic sclerotic osseous lesion. Median sternotomy. CT ABDOMEN PELVIS FINDINGS Hepatobiliary: No suspicious liver lesion. No intrahepatic biliary dilatation. Gallbladder is unremarkable. Common bile duct is dilated up to 7 mm. No definite choledocholithiasis identified. Pancreas: Pancreas is normal.  No pancreatic ductal dilatation. Spleen: Normal size spleen. Adrenals/Urinary Tract: Both adrenal glands are normal. Left kidney subcentimeter cortical cysts which does not require imaging follow-up no hydronephrosis.Bladder is unremarkable. Stomach/Bowel: Stomach and bowel loops appear normal. No bowel dilatation, obstruction or wall thickening. Large stool is identified throughout the colon. Normal appendix. Vascular/Lymphatic: Atherosclerotic calcifications of aorta. Aneurysmal dilation of infrarenal aorta measuring 3.2 x 3 x 2.8 cm which does not require imaging follow-up. No suspicious lymphadenopathy. Reproductive: Calcified uterine fibroids.  No adnexal mass. Other: Tiny fat containing umbilical hernia a small fat containing ventral wall hernia with a defect measuring 4 mm about 8 cm proximal to umbilicus. Musculoskeletal: Degenerative changes  of the spine with compression fracture of T8 vertebral body (40% height reduction), new to 2023. IMPRESSION: Compression fracture of T8 vertebral body of indeterminate age. Please refer to same day dedicated CT. Aneurysmal dilation of ascending aorta and infrarenal abdominal aorta. Enlarged pulmonary artery which can be seen the setting of pulmonary artery hypertension. Stigmata of interstitial and chronic lung changes likely sequela from prior infection/inflammation, increased to prior exam from 2023. No suspicious pulmonary nodule. Additional findings as above. Electronically Signed   By: Megan  Zare M.D.   On: 04/25/2024 17:32   CT CERVICAL SPINE WO CONTRAST Result Date: 04/25/2024 EXAM: CT CERVICAL SPINE WITHOUT CONTRAST 04/25/2024 04:57:00 PM TECHNIQUE: CT of the cervical spine was performed without the administration of intravenous contrast. Multiplanar reformatted images are provided for review. Automated exposure control, iterative reconstruction, and/or weight based adjustment of the mA/kV was utilized to reduce the radiation dose to as low as reasonably achievable. COMPARISON: None available. CLINICAL HISTORY: Polytrauma, blunt. Fall; CT CHEST ABDOMEN PELVIS W CONTRAST; Polytrauma, blunt; CT HEAD WO CONTRAST; Head trauma, moderate-severe; CT CERVICAL SPINE WO CONTRAST; Polytrauma, blunt; CT L-SPINE NO CHARGE; CT T-SPINE NO CHARGE. Omni 350 55mL; See ED Notes:; Pt coming in after a fall pt takes elequis. Pt went to stand from wheelchair. Pt got light headed and dizzy and fell forward no syncope. ; Pt was down about 4 hours. Pt has abrasion to her chin. Pt has hx of stroke minor left sided weakness. Pt reports being sick FINDINGS: CERVICAL SPINE: BONES AND ALIGNMENT: No acute fracture or traumatic malalignment. Straightening of the normal cervical lordosis is stable. The patient is in a hard collar. DEGENERATIVE CHANGES: Mild endplate degenerative changes are again noted at C4-5, C5-6 and C6-7. SOFT  TISSUES: No prevertebral soft tissue swelling. IMPRESSION: 1. No acute abnormality of the cervical spine related to the reported polytrauma and fall. 2. Mild endplate degenerative changes at C4-5, C5-6, and C6-7. Electronically signed by: Lonni Necessary MD 04/25/2024 05:28 PM EDT RP Workstation: [id number]   CT HEAD WO CONTRAST Result Date: 04/25/2024 EXAM: CT HEAD WITHOUT CONTRAST 04/25/2024 04:57:00 PM TECHNIQUE: CT of the head was performed without the administration of intravenous contrast. Automated exposure control, iterative reconstruction, and/or weight based adjustment of the mA/kV was utilized to reduce the radiation dose to as low as reasonably achievable. COMPARISON: None available. CLINICAL HISTORY: Head trauma, moderate-severe. Fall;  CT CHEST ABDOMEN PELVIS W CONTRAST; Polytrauma, blunt; CT HEAD WO CONTRAST; Head trauma, moderate-severe; CT CERVICAL SPINE WO CONTRAST; Polytrauma, blunt; CT L-SPINE NO CHARGE; CT T-SPINE NO CHARGE. Omni 350 55mL; See ED Notes:; Pt coming in after a fall pt takes elequis. Pt went to stand from wheelchair. Pt got light headed and dizzy and fell forward no syncope. ; Pt was down about 4 hours. Pt has abrasion to her chin. Pt has hx of stroke minor left sided weakness. Pt reports being sick FINDINGS: BRAIN AND VENTRICLES: Multiple remote cortical infarcts are again noted. Infarcts involve the posterior medial parietal lobes bilaterally, the inferior right temporal lobe and right occipital lobe in the anterior left middle frontal gyrus. Remote lacunar infarcts are present in the right caudate head, left lentiform nucleus and right thalamus. No acute hemorrhage. No evidence of acute infarct. No hydrocephalus. No extra-axial collection. No mass effect or midline shift. ORBITS: No acute abnormality. Soft tissue swelling extends to the periorbital regions bilaterally. SINUSES: Minimal fluid is present in the maxillary sinuses bilaterally without associated fracture. SOFT  TISSUES AND SKULL: Frontal scalp soft tissue swelling is present without underlying fracture or foreign body. No skull fracture. IMPRESSION: 1. No acute intracranial abnormality related to the head trauma. 2. Frontal scalp and bilateral periorbital soft tissue swelling without underlying fracture or foreign body. 3. Multiple remote cortical and lacunar infarcts as described above. Electronically signed by: Lonni Necessary MD 04/25/2024 05:26 PM EDT RP Workstation: [id number]   CT L-SPINE NO CHARGE Result Date: 04/25/2024 EXAM: CT OF THE LUMBAR SPINE WITHOUT CONTRAST 04/25/2024 04:57:00 PM TECHNIQUE: CT of the lumbar spine was performed without the administration of intravenous contrast. Multiplanar reformatted images are provided for review. Automated exposure control, iterative reconstruction, and/or weight based adjustment of the mA/kV was utilized to reduce the radiation dose to as low as reasonably achievable. COMPARISON: 02/13/2018. Images were not currently available. CLINICAL HISTORY: Fall; Polytrauma, blunt; Head trauma, moderate-severe; Pt coming in after a fall pt takes elequis. Pt went to stand from wheelchair. Pt got light headed and dizzy and fell forward no syncope. Pt was down about 4 hours. Pt has abrasion to her chin. Pt has hx of stroke minor left sided weakness. Pt reports being sick. FINDINGS: BONES AND ALIGNMENT: Normal vertebral body heights. No acute fracture or suspicious bone lesion. Normal alignment. DEGENERATIVE CHANGES: No significant degenerative changes. SOFT TISSUES: No acute abnormality. VASCULATURE: Atherosclerotic calcifications present in the abdominal aorta. Maximal transverse diameter is 3.0 cm. 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 IMPRESSION: 1. No evidence of acute traumatic injury. 2. Atherosclerotic calcifications in the abdominal aorta with infrarenal abdominal aortic aneurysm. The maximal transverse diameter of 3.0 cm. Recommend CT angio of  the abdomen and pelvis in 3 years. 3. 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 Electronically signed by: Lonni Necessary MD 04/25/2024 05:16 PM EDT RP Workstation: [id number]   CT T-SPINE NO CHARGE Result Date: 04/25/2024 EXAM: CT THORACIC SPINE WITHOUT CONTRAST 04/25/2024 04:57:00 PM TECHNIQUE: CT of the thoracic spine was performed without the administration of intravenous contrast. Multiplanar reformatted images are provided for review. Automated exposure control, iterative reconstruction, and/or weight based adjustment of the mA/kV was utilized to reduce the radiation dose to as low as reasonably achievable. COMPARISON: CT chest 12/26/2021. CLINICAL HISTORY: Patient with history of stroke and minor left-sided weakness, presenting after a fall from a wheelchair with subsequent head trauma and chin abrasion. The patient was down for approximately 4 hours  before seeking medical attention. No syncope reported. FINDINGS: BONES AND ALIGNMENT: Acute superior endplate compression fracture of T8 with 5 mm loss of height compared to prior study, approximately 30%. No retropulsed bone is present. No other acute fractures are present. Normal alignment. DEGENERATIVE CHANGES: No significant degenerative changes. SOFT TISSUES: No acute abnormality. VASCULATURE: Atherosclerotic changes are present throughout the thoracic descending thoracic aorta and visualized abdominal aorta without focal aneurysm. Please see CT chest for further description of the ascending aorta. Coronary artery calcifications are present. IMPRESSION: 1. Acute superior endplate compression fracture of T8 with 5 mm loss of height compared to prior study, approximately 30%. No retropulsed bone is present. No other acute fractures are present. Electronically signed by: Lonni Necessary MD 04/25/2024 05:12 PM EDT RP Workstation: [id number]   DG Pelvis Portable Result Date: 04/25/2024 CLINICAL DATA:  Trauma fall EXAM: PORTABLE PELVIS 1-2  VIEWS COMPARISON:  06/23/2022 FINDINGS: Extensive vascular calcification. SI joints are non widened. Pubic symphysis and rami appear intact. Slightly limited assessment of right femoral neck due to positioning. No definitive fracture or malalignment IMPRESSION: No definite acute osseous abnormality. Electronically Signed   By: Luke Bun M.D.   On: 04/25/2024 16:29   DG Chest Port 1 View Result Date: 04/25/2024 CLINICAL DATA:  Trauma fall EXAM: PORTABLE CHEST 1 VIEW COMPARISON:  06/23/2022, chest CT 12/26/2021 FINDINGS: Post sternotomy changes. Cardiomegaly with vascular congestion. Enlarged mediastinal silhouette compared to most recent prior radiograph but probably similar compared with scout CT image from May 2023. Aortic atherosclerosis. No pneumothorax. IMPRESSION: Cardiomegaly with vascular congestion. Enlarged mediastinal silhouette compared to most recent prior radiograph, probably partially attributed to portable technique, however consider chest CT correlation to exclude acute mediastinal pathology as patient has history of known ascending aortic aneurysm on the prior CT Electronically Signed   By: Luke Bun M.D.   On: 04/25/2024 16:28      Subjective:  No significant events overnight, she had a good BM this morning after a few days of constipation, reports her back pain is controlled on current regimen Discharge Exam: Vitals:   05/03/24 0445 05/03/24 0755  BP: 117/87   Pulse: 88   Resp: 15   Temp: 97.7 F (36.5 C)   SpO2: 97% 95%   Vitals:   05/02/24 2046 05/03/24 0012 05/03/24 0445 05/03/24 0755  BP: 103/75 104/78 117/87   Pulse: 85 85 88   Resp: 17 18 15    Temp: 98 F (36.7 C) 98 F (36.7 C) 97.7 F (36.5 C)   TempSrc: Oral Oral    SpO2: 97% 99% 97% 95%  Weight:      Height:        General: Pt is alert, awake, not in acute distress Cardiovascular: RRR, S1/S2 +, no rubs, no gallops Respiratory: CTA bilaterally, no wheezing, no rhonchi Abdominal: Soft, NT, ND,  bowel sounds + Extremities: no edema, no cyanosis    The results of significant diagnostics from this hospitalization (including imaging, microbiology, ancillary and laboratory) are listed below for reference.     Microbiology: Recent Results (from the past 240 hours)  Culture, blood (single)     Status: None   Collection Time: 04/25/24  6:32 PM   Specimen: BLOOD RIGHT ARM  Result Value Ref Range Status   Specimen Description BLOOD RIGHT ARM  Final   Special Requests   Final    BOTTLES DRAWN AEROBIC AND ANAEROBIC Blood Culture adequate volume   Culture   Final    NO GROWTH 5 DAYS  Performed at [facility], [address], [place], [place] [address]    Report Status 04/30/2024 FINAL  Final  Resp panel by RT-PCR (RSV, Flu A&B, Covid) Anterior Nasal Swab     Status: None   Collection Time: 04/25/24 11:09 PM   Specimen: Anterior Nasal Swab  Result Value Ref Range Status   SARS Coronavirus 2 by RT PCR NEGATIVE NEGATIVE Final   Influenza A by PCR NEGATIVE NEGATIVE Final   Influenza B by PCR NEGATIVE NEGATIVE Final    Comment: (NOTE) The Xpert Xpress SARS-CoV-2/FLU/RSV plus assay is intended as an aid in the diagnosis of influenza from Nasopharyngeal swab specimens and should not be used as a sole basis for treatment. Nasal washings and aspirates are unacceptable for Xpert Xpress SARS-CoV-2/FLU/RSV testing.  Fact Sheet for Patients: BloggerCourse.com  Fact Sheet for Healthcare Providers: SeriousBroker.it  This test is not yet approved or cleared by the [place]  FDA and has been authorized for detection and/or diagnosis of SARS-CoV-2 by FDA under an Emergency Use Authorization (EUA). This EUA will remain in effect (meaning this test can be used) for the duration of the COVID-19 declaration under Section 564(b)(1) of the Act, 21 U.S.C. section 360bbb-3(b)(1), unless the authorization is terminated or revoked.      Resp Syncytial Virus by PCR NEGATIVE NEGATIVE Final    Comment: (NOTE) Fact Sheet for Patients: BloggerCourse.com  Fact Sheet for Healthcare Providers: SeriousBroker.it  This test is not yet approved or cleared by the [place]  FDA and has been authorized for detection and/or diagnosis of SARS-CoV-2 by FDA under an Emergency Use Authorization (EUA). This EUA will remain in effect (meaning this test can be used) for the duration of the COVID-19 declaration under Section 564(b)(1) of the Act, 21 U.S.C. section 360bbb-3(b)(1), unless the authorization is terminated or revoked.  Performed at [facility], [address], [place], [place] [address]   Respiratory (~20 pathogens) panel by PCR     Status: Abnormal   Collection Time: 04/27/24  7:04 AM   Specimen: Nasopharyngeal Swab; Respiratory  Result Value Ref Range Status   Adenovirus NOT DETECTED NOT DETECTED Final   Coronavirus 229E NOT DETECTED NOT DETECTED Final    Comment: (NOTE) The Coronavirus on the Respiratory Panel, DOES NOT test for the novel  Coronavirus (2019 nCoV)    Coronavirus HKU1 NOT DETECTED NOT DETECTED Final   Coronavirus NL63 NOT DETECTED NOT DETECTED Final   Coronavirus OC43 NOT DETECTED NOT DETECTED Final   Metapneumovirus NOT DETECTED NOT DETECTED Final   Rhinovirus / Enterovirus DETECTED (A) NOT DETECTED Final   Influenza A NOT DETECTED NOT DETECTED Final   Influenza B NOT DETECTED NOT DETECTED Final   Parainfluenza Virus 1 NOT DETECTED NOT DETECTED Final   Parainfluenza Virus 2 NOT DETECTED NOT DETECTED Final   Parainfluenza Virus 3 NOT DETECTED NOT DETECTED Final   Parainfluenza Virus 4 NOT DETECTED NOT DETECTED Final   Respiratory Syncytial Virus NOT DETECTED NOT DETECTED Final   Bordetella pertussis NOT DETECTED NOT DETECTED Final   Bordetella Parapertussis NOT DETECTED NOT DETECTED Final   Chlamydophila pneumoniae NOT DETECTED NOT DETECTED  Final   Mycoplasma pneumoniae NOT DETECTED NOT DETECTED Final    Comment: Performed at [facility], [address] [place], [place] [address]  MRSA Next Gen by PCR, Nasal     Status: None   Collection Time: 04/27/24 10:15 AM   Specimen: Nasal Mucosa; Nasal Swab  Result Value Ref Range  Status   MRSA by PCR Next Gen NOT DETECTED NOT DETECTED Final    Comment: (NOTE) The GeneXpert MRSA Assay (FDA approved for NASAL specimens only), is one component of a comprehensive MRSA colonization surveillance program. It is not intended to diagnose MRSA infection nor to guide or monitor treatment for MRSA infections. Test performance is not FDA approved in patients less than 56 years old. Performed at [facility], [address], [place], [place] [address]      Labs: BNP (last 3 results) Recent Labs    10/09/23 1628  BNP 347.8*   Basic Metabolic Panel: Recent Labs  Lab 04/27/24 1252 04/28/24 1124 04/29/24 0835 04/30/24 0821 05/01/24 0649  NA 142 143  143 141 143 143  K 3.7 3.6  3.5 4.7 4.6 4.6  CL 113* 112*  111 108 108 103  CO2 20* 24  24 22 23 27   GLUCOSE 92 92  93 73 81 91  BUN 23 17  16 16 12 10   CREATININE 1.00 0.89  0.88 0.78 0.89 0.76  CALCIUM  8.7* 8.7*  8.7* 9.0 9.0 9.6  MG 1.5* 2.0  2.1 1.9 1.8 2.1  PHOS  --  2.3*  2.2* 3.4 3.1 3.8   Liver Function Tests: Recent Labs  Lab 04/27/24 1252 04/28/24 1124 04/29/24 0835 04/30/24 0821 05/01/24 0649  AST 87* 79*  77* 58* 40 37  ALT 58* 68*  69* 65* 51* 49*  ALKPHOS 68 69  65 70 66 71  BILITOT 0.5 0.5  0.3 0.8 0.6 0.8  PROT 5.8* 5.3*  5.7* 6.4* 5.7* 6.5  ALBUMIN 3.0* 3.0*  3.1* 3.3* 2.9* 3.4*   No results for input(s): LIPASE, AMYLASE in the last 168 hours. Recent Labs  Lab 04/27/24 1252 04/28/24 1124  AMMONIA 53* 35   CBC: Recent Labs  Lab 04/27/24 1252 04/28/24 1124 04/29/24 1431 04/30/24 0821 05/01/24 0649  WBC 10.5 8.1 7.5 7.4 6.9  NEUTROABS 6.5 4.8 4.4 4.2 3.8  HGB  9.5* 10.0* 10.2* 10.2* 11.0*  HCT 30.7* 31.8* 32.6* 32.2* 35.0*  MCV 72.4* 71.1* 72.0* 70.9* 71.6*  PLT 164 176 227 179 199   Cardiac Enzymes: Recent Labs  Lab 04/28/24 1124 04/29/24 0835 04/30/24 0821 05/01/24 0649  CKTOTAL 989* 683* 352* 244*   BNP: Invalid input(s): POCBNP CBG: No results for input(s): GLUCAP in the last 168 hours. D-Dimer No results for input(s): DDIMER in the last 72 hours. Hgb A1c No results for input(s): HGBA1C in the last 72 hours. Lipid Profile No results for input(s): CHOL, HDL, LDLCALC, TRIG, CHOLHDL, LDLDIRECT in the last 72 hours. Thyroid function studies No results for input(s): TSH, T4TOTAL, T3FREE, THYROIDAB in the last 72 hours.  Invalid input(s): FREET3 Anemia work up No results for input(s): VITAMINB12, FOLATE, FERRITIN, TIBC, IRON, RETICCTPCT in the last 72 hours. Urinalysis    Component Value Date/Time   COLORURINE YELLOW 04/25/2024 1832   APPEARANCEUR CLEAR 04/25/2024 1832   LABSPEC 1.030 04/25/2024 1832   PHURINE 5.0 04/25/2024 1832   GLUCOSEU NEGATIVE 04/25/2024 1832   HGBUR MODERATE (A) 04/25/2024 1832   BILIRUBINUR NEGATIVE 04/25/2024 1832   BILIRUBINUR negative 10/10/2023 1041   BILIRUBINUR negative 11/18/2020 1718   KETONESUR NEGATIVE 04/25/2024 1832   PROTEINUR 30 (A) 04/25/2024 1832   UROBILINOGEN 0.2 10/10/2023 1041   UROBILINOGEN 0.2 05/26/2011 2243   NITRITE NEGATIVE 04/25/2024 1832   LEUKOCYTESUR NEGATIVE 04/25/2024 1832   Sepsis Labs Recent Labs  Lab 04/28/24 1124 04/29/24 1431 04/30/24 9178  05/01/24 0649  WBC 8.1 7.5 7.4 6.9   Microbiology Recent Results (from the past 240 hours)  Culture, blood (single)     Status: None   Collection Time: 04/25/24  6:32 PM   Specimen: BLOOD RIGHT ARM  Result Value Ref Range Status   Specimen Description BLOOD RIGHT ARM  Final   Special Requests   Final    BOTTLES DRAWN AEROBIC AND ANAEROBIC Blood Culture adequate volume    Culture   Final    NO GROWTH 5 DAYS Performed at [facility], [address], [place], [place] [address]    Report Status 04/30/2024 FINAL  Final  Resp panel by RT-PCR (RSV, Flu A&B, Covid) Anterior Nasal Swab     Status: None   Collection Time: 04/25/24 11:09 PM   Specimen: Anterior Nasal Swab  Result Value Ref Range Status   SARS Coronavirus 2 by RT PCR NEGATIVE NEGATIVE Final   Influenza A by PCR NEGATIVE NEGATIVE Final   Influenza B by PCR NEGATIVE NEGATIVE Final    Comment: (NOTE) The Xpert Xpress SARS-CoV-2/FLU/RSV plus assay is intended as an aid in the diagnosis of influenza from Nasopharyngeal swab specimens and should not be used as a sole basis for treatment. Nasal washings and aspirates are unacceptable for Xpert Xpress SARS-CoV-2/FLU/RSV testing.  Fact Sheet for Patients: BloggerCourse.com  Fact Sheet for Healthcare Providers: SeriousBroker.it  This test is not yet approved or cleared by the [place]  FDA and has been authorized for detection and/or diagnosis of SARS-CoV-2 by FDA under an Emergency Use Authorization (EUA). This EUA will remain in effect (meaning this test can be used) for the duration of the COVID-19 declaration under Section 564(b)(1) of the Act, 21 U.S.C. section 360bbb-3(b)(1), unless the authorization is terminated or revoked.     Resp Syncytial Virus by PCR NEGATIVE NEGATIVE Final    Comment: (NOTE) Fact Sheet for Patients: BloggerCourse.com  Fact Sheet for Healthcare Providers: SeriousBroker.it  This test is not yet approved or cleared by the [place]  FDA and has been authorized for detection and/or diagnosis of SARS-CoV-2 by FDA under an Emergency Use Authorization (EUA). This EUA will remain in effect (meaning this test can be used) for the duration of the COVID-19 declaration under Section 564(b)(1) of the Act, 21  U.S.C. section 360bbb-3(b)(1), unless the authorization is terminated or revoked.  Performed at [facility], [address], [place], [place] [address]   Respiratory (~20 pathogens) panel by PCR     Status: Abnormal   Collection Time: 04/27/24  7:04 AM   Specimen: Nasopharyngeal Swab; Respiratory  Result Value Ref Range Status   Adenovirus NOT DETECTED NOT DETECTED Final   Coronavirus 229E NOT DETECTED NOT DETECTED Final    Comment: (NOTE) The Coronavirus on the Respiratory Panel, DOES NOT test for the novel  Coronavirus (2019 nCoV)    Coronavirus HKU1 NOT DETECTED NOT DETECTED Final   Coronavirus NL63 NOT DETECTED NOT DETECTED Final   Coronavirus OC43 NOT DETECTED NOT DETECTED Final   Metapneumovirus NOT DETECTED NOT DETECTED Final   Rhinovirus / Enterovirus DETECTED (A) NOT DETECTED Final   Influenza A NOT DETECTED NOT DETECTED Final   Influenza B NOT DETECTED NOT DETECTED Final   Parainfluenza Virus 1 NOT DETECTED NOT DETECTED Final   Parainfluenza Virus 2 NOT DETECTED NOT DETECTED Final   Parainfluenza Virus 3 NOT DETECTED NOT DETECTED Final   Parainfluenza Virus 4 NOT DETECTED NOT DETECTED Final   Respiratory Syncytial Virus NOT  DETECTED NOT DETECTED Final   Bordetella pertussis NOT DETECTED NOT DETECTED Final   Bordetella Parapertussis NOT DETECTED NOT DETECTED Final   Chlamydophila pneumoniae NOT DETECTED NOT DETECTED Final   Mycoplasma pneumoniae NOT DETECTED NOT DETECTED Final    Comment: Performed at [facility], [address], [place], [place] [address]  MRSA Next Gen by PCR, Nasal     Status: None   Collection Time: 04/27/24 10:15 AM   Specimen: Nasal Mucosa; Nasal Swab  Result Value Ref Range Status   MRSA by PCR Next Gen NOT DETECTED NOT DETECTED Final    Comment: (NOTE) The GeneXpert MRSA Assay (FDA approved for NASAL specimens only), is one component of a comprehensive MRSA colonization surveillance program. It is not intended to diagnose MRSA  infection nor to guide or monitor treatment for MRSA infections. Test performance is not FDA approved in patients less than 15 years old. Performed at [facility], [address] [place], [place] [address]      Time coordinating discharge: Over 30 minutes  SIGNED:   Brayton Lye, MD  [facility] 05/03/2024, 9:59 AM Pager   If 7PM-7AM, please contact night-coverage [email or web address]

## 2024-05-03 NOTE — Discharge Instructions (Addendum)
 Follow with Primary MD Georgina Speaks, FNP /SNF physician    Activity: As tolerated with Full fall precautions use walker/cane & assistance as needed   Disposition SNF   Diet: Heart Healthy  For Heart failure patients - Check your Weight same time everyday, if you gain over 2 pounds, or you develop in leg swelling, experience more shortness of breath or chest pain, call your Primary MD immediately. Follow Cardiac Low Salt Diet and 1.5 lit/day fluid restriction.   On your next visit with your primary care physician please Get Medicines reviewed and adjusted.   Please request your Prim.MD to go over all Hospital Tests and Procedure/Radiological results at the follow up, please get all Hospital records sent to your Prim MD by signing hospital release before you go home.   If you experience worsening of your admission symptoms, develop shortness of breath, life threatening emergency, suicidal or homicidal thoughts you must seek medical attention immediately by calling 911 or calling your MD immediately  if symptoms less severe.  You Must read complete instructions/literature along with all the possible adverse reactions/side effects for all the Medicines you take and that have been prescribed to you. Take any new Medicines after you have completely understood and accpet all the possible adverse reactions/side effects.   Do not drive, operating heavy machinery, perform activities at heights, swimming or participation in water activities or provide baby sitting services if your were admitted for syncope or siezures until you have seen by Primary MD or a Neurologist and advised to do so again.  Do not drive when taking Pain medications.    Do not take more than prescribed Pain, Sleep and Anxiety Medications  Special Instructions: If you have smoked or chewed Tobacco  in the last 2 yrs please stop smoking, stop any regular Alcohol  and or any Recreational drug use.  Wear Seat belts while  driving.   Please note  You were cared for by a hospitalist during your hospital stay. If you have any questions about your discharge medications or the care you received while you were in the hospital after you are discharged, you can call the unit and asked to speak with the hospitalist on call if the hospitalist that took care of you is not available. Once you are discharged, your primary care physician will handle any further medical issues. Please note that NO REFILLS for any discharge medications will be authorized once you are discharged, as it is imperative that you return to your primary care physician (or establish a relationship with a primary care physician if you do not have one) for your aftercare needs so that they can reassess your need for medications and monitor your lab values.

## 2024-05-07 DIAGNOSIS — I679 Cerebrovascular disease, unspecified: Secondary | ICD-10-CM | POA: Diagnosis not present

## 2024-05-07 DIAGNOSIS — J40 Bronchitis, not specified as acute or chronic: Secondary | ICD-10-CM | POA: Diagnosis not present

## 2024-05-07 DIAGNOSIS — I509 Heart failure, unspecified: Secondary | ICD-10-CM | POA: Diagnosis not present

## 2024-05-07 DIAGNOSIS — Z8619 Personal history of other infectious and parasitic diseases: Secondary | ICD-10-CM | POA: Diagnosis not present

## 2024-05-07 DIAGNOSIS — S22060D Wedge compression fracture of T7-T8 vertebra, subsequent encounter for fracture with routine healing: Secondary | ICD-10-CM | POA: Diagnosis not present

## 2024-05-07 DIAGNOSIS — I2510 Atherosclerotic heart disease of native coronary artery without angina pectoris: Secondary | ICD-10-CM | POA: Diagnosis not present

## 2024-05-07 DIAGNOSIS — I340 Nonrheumatic mitral (valve) insufficiency: Secondary | ICD-10-CM | POA: Diagnosis not present

## 2024-05-07 DIAGNOSIS — I69354 Hemiplegia and hemiparesis following cerebral infarction affecting left non-dominant side: Secondary | ICD-10-CM | POA: Diagnosis not present

## 2024-05-07 DIAGNOSIS — I480 Paroxysmal atrial fibrillation: Secondary | ICD-10-CM | POA: Diagnosis not present

## 2024-05-21 ENCOUNTER — Inpatient Hospital Stay: Payer: Self-pay | Admitting: Nurse Practitioner

## 2024-05-21 DIAGNOSIS — Z7982 Long term (current) use of aspirin: Secondary | ICD-10-CM | POA: Diagnosis not present

## 2024-05-21 DIAGNOSIS — I480 Paroxysmal atrial fibrillation: Secondary | ICD-10-CM | POA: Diagnosis not present

## 2024-05-21 DIAGNOSIS — S22060D Wedge compression fracture of T7-T8 vertebra, subsequent encounter for fracture with routine healing: Secondary | ICD-10-CM | POA: Diagnosis not present

## 2024-05-21 DIAGNOSIS — Z7901 Long term (current) use of anticoagulants: Secondary | ICD-10-CM | POA: Diagnosis not present

## 2024-05-21 DIAGNOSIS — I25118 Atherosclerotic heart disease of native coronary artery with other forms of angina pectoris: Secondary | ICD-10-CM | POA: Diagnosis not present

## 2024-05-21 DIAGNOSIS — I69354 Hemiplegia and hemiparesis following cerebral infarction affecting left non-dominant side: Secondary | ICD-10-CM | POA: Diagnosis not present

## 2024-05-21 DIAGNOSIS — I509 Heart failure, unspecified: Secondary | ICD-10-CM | POA: Diagnosis not present

## 2024-05-21 DIAGNOSIS — J42 Unspecified chronic bronchitis: Secondary | ICD-10-CM | POA: Diagnosis not present

## 2024-05-21 NOTE — Progress Notes (Deleted)
 Virtual Visit via Video Note  LILLETTE [name], CMA,acting as a scribe for Kimberly Ada, FNP.,have documented all relevant documentation on the behalf of Kimberly Ada, FNP,as directed by  Kimberly Ada, FNP while in the presence of Kimberly Ada, FNP.  I connected with Kimberly Kimberly on 05/21/24 at 11:20 AM EDT by a video enabled telemedicine application and verified that I am speaking with the correct person using two identifiers.  {Patient Location:[id number]::Home} {Provider Location:[phone]::Home Office}  I discussed the limitations, risks, security, and privacy concerns of performing an evaluation and management service by video and the availability of in person appointments. I also discussed with the patient that there may be a patient responsible charge related to this service. The patient expressed understanding and agreed to proceed.  Subjective: PCP: Kimberly Gaines, FNP  No chief complaint on file.  HPI   ROS: Per HPI  Current Outpatient Medications:    albuterol  (VENTOLIN  HFA) 108 (90 Base) MCG/ACT inhaler, INHALE 2 PUFFS BY MOUTH INTO THE LUNGS EVERY 6 HOURS AS NEEDED FOR WHEEZING OR SHORTNESS OF BREATH, Disp: 8.5 g, Rfl: 1   alendronate  (FOSAMAX ) 70 MG tablet, TAKE 1 TABLET BY MOUTH EVERY 7 DAYS. Take WITH A full GLASS OF WATER ON EMPTY stomach (Patient taking differently: Take 70 mg by mouth every Monday.), Disp: 12 tablet, Rfl: 1   ANORO ELLIPTA  62.5-25 MCG/ACT AEPB, INHALE 1 PUFF BY MOUTH INTO THE LUNGS ONCE DAILY. RINSE MOUTH AFTER USE, Disp: 60 each, Rfl: 1   benzonatate  (TESSALON  PERLES) 100 MG capsule, Take 1 capsule (100 mg total) by mouth 3 (three) times daily as needed., Disp: 30 capsule, Rfl: 0   calcium -vitamin D  (OSCAL WITH D) 500-5 MG-MCG tablet, Take 1 tablet by mouth 2 (two) times daily with a meal., Disp: , Rfl:    carvedilol  (COREG ) 25 MG tablet, Take 1 tablet (25 mg total) by mouth 2 (two) times daily with a meal., Disp: , Rfl:    docusate sodium   (COLACE) 100 MG capsule, Take 2 capsules (200 mg total) by mouth 2 (two) times daily., Disp: , Rfl:    ELIQUIS  5 MG TABS tablet, TAKE 1 TABLET BY MOUTH 2 TIMES DAILY, Disp: 60 tablet, Rfl: 1   Ferric Maltol  (ACCRUFER ) 30 MG CAPS, Take 1 capsule (30 mg total) by mouth daily., Disp: 90 capsule, Rfl: 1   fluticasone  (FLONASE ) 50 MCG/ACT nasal spray, SPRAY 2 SPRAYS into EACH nostril DAILY (Patient taking differently: Place 2 sprays into both nostrils daily as needed for allergies.), Disp: 16 g, Rfl: 1   hydrocortisone cream 1 %, Apply 1 application topically daily as needed for itching., Disp: , Rfl:    mirtazapine  (REMERON ) 15 MG tablet, TAKE 1 TABLET BY MOUTH AT BEDTIME, Disp: 30 tablet, Rfl: 2   pantoprazole  (PROTONIX ) 40 MG tablet, TAKE 1 TABLET BY MOUTH EVERY DAY, Disp: 30 tablet, Rfl: 2   polyethylene glycol (MIRALAX  / GLYCOLAX ) 17 g packet, Take 17 g by mouth daily. Hold for diarrhea, Disp: , Rfl:    sacubitril -valsartan  (ENTRESTO ) 24-26 MG, Take 1 tablet by mouth 2 (two) times daily., Disp: , Rfl:    Vitamin D , Ergocalciferol , (DRISDOL ) 1.25 MG (50000 UNIT) CAPS capsule, TAKE 1 CAPSULE BY MOUTH TWICE A WEEK (Patient taking differently: Take 50,000 Units by mouth See admin instructions. Take 1 capsule (50,000u) by mouth twice a week on Monday and Thursday.), Disp: 24 capsule, Rfl: 1  Observations/Objective: There were no vitals filed for this visit. Physical Exam  Assessment and  Plan: Hospital discharge follow-up  Near syncope    Follow Up Instructions: No follow-ups on file.   I discussed the assessment and treatment plan with the patient. The patient was provided an opportunity to ask questions, and all were answered. The patient agreed with the plan and demonstrated an understanding of the instructions.   The patient was advised to call back or seek an in-person evaluation if the symptoms worsen or if the condition fails to improve as anticipated.  The above assessment and  management plan was discussed with the patient. The patient verbalized understanding of and has agreed to the management plan.   LILLETTE Kimberly Ada, FNP, have reviewed all documentation for this visit. The documentation on 05/21/24 for the exam, diagnosis, procedures, and orders are all accurate and complete.

## 2024-05-22 ENCOUNTER — Telehealth: Payer: Self-pay | Admitting: Nurse Practitioner

## 2024-05-22 ENCOUNTER — Telehealth: Payer: Self-pay

## 2024-05-22 NOTE — Telephone Encounter (Signed)
 Called pt daughter to see if the pt has been discharged from skilled facility so we can schedule f/u appt

## 2024-05-22 NOTE — Telephone Encounter (Signed)
 Copied from CRM [id number]. Topic: General - Other >> May 22, 2024 11:28 AM Joesph NOVAK wrote: Reason for CRM: patients daughter calling to let patients provider know she has not been discharged and will call to schedule F/U appt.

## 2024-05-27 ENCOUNTER — Inpatient Hospital Stay (HOSPITAL_COMMUNITY)
Admission: EM | Admit: 2024-05-27 | Discharge: 2024-06-04 | DRG: 308 | Disposition: A | Discharge status: Skilled Nursing Facility | Attending: Internal Medicine | Admitting: Internal Medicine

## 2024-05-27 ENCOUNTER — Emergency Department (HOSPITAL_COMMUNITY)

## 2024-05-27 ENCOUNTER — Other Ambulatory Visit: Payer: Self-pay

## 2024-05-27 ENCOUNTER — Encounter (HOSPITAL_COMMUNITY): Payer: Self-pay

## 2024-05-27 DIAGNOSIS — G8929 Other chronic pain: Secondary | ICD-10-CM | POA: Diagnosis not present

## 2024-05-27 DIAGNOSIS — I130 Hypertensive heart and chronic kidney disease with heart failure and stage 1 through stage 4 chronic kidney disease, or unspecified chronic kidney disease: Secondary | ICD-10-CM | POA: Diagnosis not present

## 2024-05-27 DIAGNOSIS — I714 Abdominal aortic aneurysm, without rupture, unspecified: Secondary | ICD-10-CM | POA: Diagnosis present

## 2024-05-27 DIAGNOSIS — M549 Dorsalgia, unspecified: Secondary | ICD-10-CM | POA: Diagnosis not present

## 2024-05-27 DIAGNOSIS — N179 Acute kidney failure, unspecified: Secondary | ICD-10-CM | POA: Diagnosis not present

## 2024-05-27 DIAGNOSIS — M810 Age-related osteoporosis without current pathological fracture: Secondary | ICD-10-CM | POA: Diagnosis present

## 2024-05-27 DIAGNOSIS — I4891 Unspecified atrial fibrillation: Principal | ICD-10-CM | POA: Diagnosis present

## 2024-05-27 DIAGNOSIS — I421 Obstructive hypertrophic cardiomyopathy: Secondary | ICD-10-CM | POA: Diagnosis not present

## 2024-05-27 DIAGNOSIS — Z8249 Family history of ischemic heart disease and other diseases of the circulatory system: Secondary | ICD-10-CM

## 2024-05-27 DIAGNOSIS — I517 Cardiomegaly: Secondary | ICD-10-CM | POA: Diagnosis not present

## 2024-05-27 DIAGNOSIS — E785 Hyperlipidemia, unspecified: Secondary | ICD-10-CM | POA: Diagnosis present

## 2024-05-27 DIAGNOSIS — I5A Non-ischemic myocardial injury (non-traumatic): Secondary | ICD-10-CM | POA: Diagnosis not present

## 2024-05-27 DIAGNOSIS — I422 Other hypertrophic cardiomyopathy: Secondary | ICD-10-CM | POA: Diagnosis not present

## 2024-05-27 DIAGNOSIS — Z7409 Other reduced mobility: Secondary | ICD-10-CM | POA: Diagnosis present

## 2024-05-27 DIAGNOSIS — I25708 Atherosclerosis of coronary artery bypass graft(s), unspecified, with other forms of angina pectoris: Secondary | ICD-10-CM | POA: Diagnosis not present

## 2024-05-27 DIAGNOSIS — I712 Thoracic aortic aneurysm, without rupture, unspecified: Secondary | ICD-10-CM | POA: Diagnosis not present

## 2024-05-27 DIAGNOSIS — I6523 Occlusion and stenosis of bilateral carotid arteries: Secondary | ICD-10-CM | POA: Diagnosis not present

## 2024-05-27 DIAGNOSIS — E8721 Acute metabolic acidosis: Secondary | ICD-10-CM | POA: Diagnosis present

## 2024-05-27 DIAGNOSIS — I5043 Acute on chronic combined systolic (congestive) and diastolic (congestive) heart failure: Secondary | ICD-10-CM | POA: Diagnosis not present

## 2024-05-27 DIAGNOSIS — Z833 Family history of diabetes mellitus: Secondary | ICD-10-CM

## 2024-05-27 DIAGNOSIS — I4892 Unspecified atrial flutter: Secondary | ICD-10-CM | POA: Diagnosis not present

## 2024-05-27 DIAGNOSIS — Z7983 Long term (current) use of bisphosphonates: Secondary | ICD-10-CM

## 2024-05-27 DIAGNOSIS — R1110 Vomiting, unspecified: Secondary | ICD-10-CM | POA: Diagnosis not present

## 2024-05-27 DIAGNOSIS — Z7982 Long term (current) use of aspirin: Secondary | ICD-10-CM

## 2024-05-27 DIAGNOSIS — R0602 Shortness of breath: Secondary | ICD-10-CM | POA: Diagnosis not present

## 2024-05-27 DIAGNOSIS — I129 Hypertensive chronic kidney disease with stage 1 through stage 4 chronic kidney disease, or unspecified chronic kidney disease: Secondary | ICD-10-CM | POA: Diagnosis not present

## 2024-05-27 DIAGNOSIS — I69354 Hemiplegia and hemiparesis following cerebral infarction affecting left non-dominant side: Secondary | ICD-10-CM | POA: Diagnosis not present

## 2024-05-27 DIAGNOSIS — Z7901 Long term (current) use of anticoagulants: Secondary | ICD-10-CM

## 2024-05-27 DIAGNOSIS — R9431 Abnormal electrocardiogram [ECG] [EKG]: Secondary | ICD-10-CM | POA: Diagnosis not present

## 2024-05-27 DIAGNOSIS — R7303 Prediabetes: Secondary | ICD-10-CM | POA: Diagnosis present

## 2024-05-27 DIAGNOSIS — N1831 Chronic kidney disease, stage 3a: Secondary | ICD-10-CM | POA: Diagnosis not present

## 2024-05-27 DIAGNOSIS — D649 Anemia, unspecified: Secondary | ICD-10-CM | POA: Diagnosis present

## 2024-05-27 DIAGNOSIS — E876 Hypokalemia: Secondary | ICD-10-CM | POA: Diagnosis not present

## 2024-05-27 DIAGNOSIS — D509 Iron deficiency anemia, unspecified: Secondary | ICD-10-CM | POA: Diagnosis not present

## 2024-05-27 DIAGNOSIS — Z604 Social exclusion and rejection: Secondary | ICD-10-CM | POA: Diagnosis present

## 2024-05-27 DIAGNOSIS — R109 Unspecified abdominal pain: Secondary | ICD-10-CM | POA: Diagnosis not present

## 2024-05-27 DIAGNOSIS — Z993 Dependence on wheelchair: Secondary | ICD-10-CM

## 2024-05-27 DIAGNOSIS — M545 Low back pain, unspecified: Secondary | ICD-10-CM | POA: Diagnosis present

## 2024-05-27 DIAGNOSIS — I272 Pulmonary hypertension, unspecified: Secondary | ICD-10-CM | POA: Diagnosis not present

## 2024-05-27 DIAGNOSIS — I340 Nonrheumatic mitral (valve) insufficiency: Secondary | ICD-10-CM | POA: Diagnosis not present

## 2024-05-27 DIAGNOSIS — Z888 Allergy status to other drugs, medicaments and biological substances status: Secondary | ICD-10-CM

## 2024-05-27 DIAGNOSIS — R0989 Other specified symptoms and signs involving the circulatory and respiratory systems: Secondary | ICD-10-CM | POA: Diagnosis not present

## 2024-05-27 DIAGNOSIS — Z751 Person awaiting admission to adequate facility elsewhere: Secondary | ICD-10-CM

## 2024-05-27 DIAGNOSIS — Z79899 Other long term (current) drug therapy: Secondary | ICD-10-CM

## 2024-05-27 DIAGNOSIS — J449 Chronic obstructive pulmonary disease, unspecified: Secondary | ICD-10-CM | POA: Diagnosis present

## 2024-05-27 DIAGNOSIS — I252 Old myocardial infarction: Secondary | ICD-10-CM

## 2024-05-27 DIAGNOSIS — Z8419 Family history of other disorders of kidney and ureter: Secondary | ICD-10-CM

## 2024-05-27 DIAGNOSIS — L89153 Pressure ulcer of sacral region, stage 3: Secondary | ICD-10-CM | POA: Diagnosis present

## 2024-05-27 DIAGNOSIS — R7989 Other specified abnormal findings of blood chemistry: Secondary | ICD-10-CM | POA: Diagnosis not present

## 2024-05-27 DIAGNOSIS — I4811 Longstanding persistent atrial fibrillation: Secondary | ICD-10-CM | POA: Diagnosis not present

## 2024-05-27 DIAGNOSIS — K5900 Constipation, unspecified: Secondary | ICD-10-CM | POA: Diagnosis present

## 2024-05-27 DIAGNOSIS — I2489 Other forms of acute ischemic heart disease: Secondary | ICD-10-CM | POA: Diagnosis not present

## 2024-05-27 DIAGNOSIS — I2510 Atherosclerotic heart disease of native coronary artery without angina pectoris: Secondary | ICD-10-CM | POA: Diagnosis not present

## 2024-05-27 DIAGNOSIS — I959 Hypotension, unspecified: Secondary | ICD-10-CM | POA: Diagnosis not present

## 2024-05-27 DIAGNOSIS — I5022 Chronic systolic (congestive) heart failure: Secondary | ICD-10-CM

## 2024-05-27 DIAGNOSIS — R350 Frequency of micturition: Secondary | ICD-10-CM | POA: Diagnosis present

## 2024-05-27 DIAGNOSIS — Z87891 Personal history of nicotine dependence: Secondary | ICD-10-CM

## 2024-05-27 DIAGNOSIS — Z8739 Personal history of other diseases of the musculoskeletal system and connective tissue: Secondary | ICD-10-CM

## 2024-05-27 DIAGNOSIS — R079 Chest pain, unspecified: Secondary | ICD-10-CM | POA: Diagnosis not present

## 2024-05-27 DIAGNOSIS — Z818 Family history of other mental and behavioral disorders: Secondary | ICD-10-CM

## 2024-05-27 DIAGNOSIS — Z743 Need for continuous supervision: Secondary | ICD-10-CM | POA: Diagnosis not present

## 2024-05-27 LAB — CBC
HCT: 33.8 % — ABNORMAL LOW (ref 36.0–46.0)
Hemoglobin: 10.4 g/dL — ABNORMAL LOW (ref 12.0–15.0)
MCH: 22.4 pg — ABNORMAL LOW (ref 26.0–34.0)
MCHC: 30.8 g/dL (ref 30.0–36.0)
MCV: 72.7 fL — ABNORMAL LOW (ref 80.0–100.0)
Platelets: 172 10*3/uL (ref 150–400)
RBC: 4.65 MIL/uL (ref 3.87–5.11)
RDW: 16.3 % — ABNORMAL HIGH (ref 11.5–15.5)
WBC: 12.1 10*3/uL — ABNORMAL HIGH (ref 4.0–10.5)
nRBC: 0 % (ref 0.0–0.2)

## 2024-05-27 LAB — BASIC METABOLIC PANEL WITH GFR
Anion gap: 12 (ref 5–15)
BUN: 34 mg/dL — ABNORMAL HIGH (ref 8–23)
CO2: 19 mmol/L — ABNORMAL LOW (ref 22–32)
Calcium: 9.6 mg/dL (ref 8.9–10.3)
Chloride: 104 mmol/L (ref 98–111)
Creatinine, Ser: 1.54 mg/dL — ABNORMAL HIGH (ref 0.44–1.00)
GFR, Estimated: 37 mL/min — ABNORMAL LOW
Glucose, Bld: 108 mg/dL — ABNORMAL HIGH (ref 70–99)
Potassium: 3.4 mmol/L — ABNORMAL LOW (ref 3.5–5.1)
Sodium: 135 mmol/L (ref 135–145)

## 2024-05-27 LAB — MAGNESIUM: Magnesium: 1.8 mg/dL (ref 1.7–2.4)

## 2024-05-27 LAB — TROPONIN I (HIGH SENSITIVITY)
Troponin I (High Sensitivity): 354 ng/L (ref <18)
Troponin I (High Sensitivity): 407 ng/L (ref <18)

## 2024-05-27 MED ORDER — METOPROLOL TARTRATE 25 MG PO TABS
25.0000 mg | ORAL_TABLET | Freq: Once | ORAL | Status: AC
  Administered 2024-05-27: 25 mg via ORAL
  Filled 2024-05-27: qty 1

## 2024-05-27 MED ORDER — SODIUM CHLORIDE 0.9 % IV BOLUS
1000.0000 mL | Freq: Once | INTRAVENOUS | Status: AC
  Administered 2024-05-27: 1000 mL via INTRAVENOUS

## 2024-05-27 MED ORDER — OXYCODONE HCL 5 MG PO TABS
5.0000 mg | ORAL_TABLET | Freq: Once | ORAL | Status: AC
  Administered 2024-05-27: 5 mg via ORAL
  Filled 2024-05-27: qty 1

## 2024-05-27 MED ORDER — METOPROLOL TARTRATE 5 MG/5ML IV SOLN
10.0000 mg | Freq: Once | INTRAVENOUS | Status: AC
  Administered 2024-05-27: 10 mg via INTRAVENOUS
  Filled 2024-05-27: qty 10

## 2024-05-27 MED ORDER — METOPROLOL TARTRATE 5 MG/5ML IV SOLN
5.0000 mg | Freq: Once | INTRAVENOUS | Status: AC
  Administered 2024-05-27: 5 mg via INTRAVENOUS
  Filled 2024-05-27: qty 5

## 2024-05-27 MED ORDER — MAGNESIUM SULFATE 2 GM/50ML IV SOLN
2.0000 g | Freq: Once | INTRAVENOUS | Status: AC
  Administered 2024-05-27: 2 g via INTRAVENOUS
  Filled 2024-05-27: qty 50

## 2024-05-27 MED ORDER — POTASSIUM CHLORIDE 10 MEQ/100ML IV SOLN
10.0000 meq | INTRAVENOUS | Status: AC
  Administered 2024-05-28 (×2): 10 meq via INTRAVENOUS
  Filled 2024-05-27: qty 100

## 2024-05-27 MED ORDER — ACETAMINOPHEN 500 MG PO TABS
1000.0000 mg | ORAL_TABLET | Freq: Once | ORAL | Status: AC
  Administered 2024-05-27: 1000 mg via ORAL
  Filled 2024-05-27: qty 2

## 2024-05-27 MED ORDER — KETOROLAC TROMETHAMINE 15 MG/ML IJ SOLN
15.0000 mg | Freq: Once | INTRAMUSCULAR | Status: AC
  Administered 2024-05-27: 15 mg via INTRAVENOUS
  Filled 2024-05-27: qty 1

## 2024-05-27 NOTE — H&P (Signed)
 Kimberly [name] FMW:[id number] DOB: 12/16/1955 DOA: 05/27/2024     PCP: Kimberly Speaks, FNP   Outpatient Specialists: * NONE CARDS: * Dr. Lynwood Schilling, MD  NEphrology: *  Dr. No care team member to [name]  NEurology *   Dr. Pulmonary *  Dr.  Oncology * Dr.No care team member to [name]  GI* Dr.  Gwen, [name]) Kimberly [name] Urology Dr. *  Patient arrived to ER on 05/27/24 at 2031 Referred by Kimberly Kimberly Share, DO   Patient coming from:    home Lives alone,   *** With family From facility *** SNF, [place], [place]    Chief Complaint: *** Chief Complaint  Patient presents with  . Back Pain    HPI: Kimberly [name] is a 68 y.o. female with medical history significant of ***     Presented with   * Patient recently came home from rehab found to have palpations Her hr was up to 130 EF reduced down to 40% Cardiology was consulted  Patient received IV fluids and iv metoprolol  but HR still up  Had very similar admission in September at that time she was diagnosed with possible sepsis and received antibiotics was noted to have elevated lactic acid elevated troponin as well which felt to be secondary to demand EF was mildly reduced with global hypokinesis She has severe mitral regurgitation and the plan was for her to undergo repeat echogram as an outpatient  Also during her last admission she had T8 compression fracture and rhabdomyolysis requiring TLSO brace felt to be nonsurgical  Patient is pleasant but but has not been completely forthcoming with history       Denies significant ETOH intake *** Does not smoke*** but interested in quitting***  Denies marijuana use ***    Regarding pertinent Chronic problems: ***  ****Hyperlipidemia - *on statins {statin:315258}  Lipid Panel     Component Value Date/Time   CHOL 118 04/02/2024 1532   TRIG 92 04/02/2024 1532   HDL 52 04/02/2024 1532   CHOLHDL 2.3 04/02/2024 1532   CHOLHDL 3.6 02/14/2018 0314    VLDL 16 02/14/2018 0314   LDLCALC 48 04/02/2024 1532   LABVLDL 18 04/02/2024 1532    ***HTN on   ***chronic CHF diastolic/systolic/ combined - last echo*** Recent Results (from the past 56199 hours)  ECHOCARDIOGRAM COMPLETE   Collection Time: 04/27/24  5:29 PM  Result Value   Weight 2,000   Height 67   BP 128/105   S' Lateral 3.10   AR max vel 2.85   AV Peak grad 1.6   Ao pk vel 0.64   Area-P 1/2 5.88   Est EF 45 - 50%   Narrative      ECHOCARDIOGRAM REPORT       Patient Name:   Kimberly [name] Date of Exam: 04/27/2024 Medical Rec #:  [id number]         Height:       67.0 in Accession #:    [id number]        Weight:       125.0 [name] Date of Birth:  September 21, 1955        BSA:          1.656 m Patient Age:    67 years          BP:           128/105 mmHg Patient Gender: F  HR:           119 bpm. Exam Location:  Inpatient  Procedure: 2D Echo, Cardiac Doppler and Color Doppler (Both Spectral and Color            Flow Doppler were utilized during procedure).  Indications:     NSTEMI I21.4   History:         Patient has prior history of Echocardiogram examinations, most                  recent 06/23/2022. Hypertrophic Cardiomyopathy, CAD, Prior                  CABG, Stroke, COPD and CKD, stage 3, Arrythmias:Atrial                  Fibrillation, Signs/Symptoms:Dyspnea and Shortness of Breath;                  Risk Factors:Hypertension, Dyslipidemia and Current Smoker.   Sonographer:     Thea Norlander RCS Referring Phys:  CAMILA DELENA NED Diagnosing Phys: Lonni Nanas MD  IMPRESSIONS    1. Left ventricular ejection fraction, by estimation, is 45 to 50%. The left ventricle has mildly decreased function. The left ventricle demonstrates regional wall motion abnormalities (see scoring diagram/findings for description). There is mild left  ventricular hypertrophy. Left ventricular diastolic parameters are indeterminate.  2. Right ventricular systolic  function is mildly reduced. The right ventricular size is normal. There is normal Kimberly [name] artery systolic pressure. The estimated right ventricular systolic pressure is 26.8 mmHg.  3. Left atrial size was severely dilated.  4. The mitral valve is abnormal. Moderate to severe mitral valve regurgitation. Visually MR appears moderate, but may be underestimating as splay artifact is seen, though low E wave velocity argues against severe MR  5. The aortic valve was not well visualized. Aortic valve regurgitation is not visualized. Kimberly aortic stenosis is present.  6. Aortic dilatation noted. 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  7. The inferior vena cava is dilated in size with >50% respiratory variability, suggesting right atrial pressure of 8 mmHg.  FINDINGS  Left Ventricle: Left ventricular ejection fraction, by estimation, is 45 to 50%. The left ventricle has mildly decreased function. The left ventricle demonstrates regional wall motion abnormalities. The left ventricular internal cavity size was normal  in size. There is mild left ventricular hypertrophy. Left ventricular diastolic parameters are indeterminate.    LV Wall Scoring: The basal inferolateral segment is akinetic. The antero-lateral wall and mid inferolateral segment are hypokinetic. The entire anterior wall, entire septum, entire apex, and entire inferior wall are normal.  Right Ventricle: The right ventricular size is normal. Kimberly increase in right ventricular wall thickness. Right ventricular systolic function is mildly reduced. There is normal Kimberly [name] artery systolic pressure. The tricuspid regurgitant velocity is 2.17  m/s, and with an assumed right atrial pressure of 8 mmHg, the estimated right ventricular systolic pressure is 26.8 mmHg.  Left Atrium: Left atrial size was severely dilated.  Right Atrium: Right atrial size was normal in size.  Pericardium: There is Kimberly evidence of pericardial effusion.  Mitral  Valve: The mitral valve is abnormal. Moderate to severe mitral valve regurgitation.  Tricuspid Valve: The tricuspid valve is normal in structure. Tricuspid valve regurgitation is mild.  Aortic Valve: The aortic valve was not well visualized. Aortic valve regurgitation is not visualized. Kimberly aortic stenosis is present. Aortic valve peak gradient measures 1.6 mmHg.  Pulmonic  Valve: The pulmonic valve was not well visualized. Pulmonic valve regurgitation is trivial.  Aorta: The aortic root is normal in size and structure and aortic dilatation noted. There is an aneurysm involving the ascending aorta measuring 47 mm.  Venous: The inferior vena cava is dilated in size with greater than 50% respiratory variability, suggesting right atrial pressure of 8 mmHg.  IAS/Shunts: The interatrial septum was not well visualized.    LEFT VENTRICLE PLAX 2D LVIDd:         3.90 cm   Diastology LVIDs:         3.10 cm   LV e' medial:    10.70 cm/s LV PW:         1.00 cm   LV E/e' medial:  8.3 LV IVS:        0.90 cm   LV e' lateral:   15.50 cm/s LVOT diam:     2.00 cm   LV E/e' lateral: 5.7 LV SV:         27 LV SV Index:   16 LVOT Area:     3.14 cm    RIGHT VENTRICLE            IVC RV S prime:     9.01 cm/s  IVC diam: 2.50 cm TAPSE (M-mode): 1.2 cm  LEFT ATRIUM             Index        RIGHT ATRIUM           Index LA diam:        3.70 cm 2.23 cm/m   RA Area:     19.60 cm LA Vol (A2C):   64.5 ml 38.95 ml/m  RA Volume:   46.60 ml  28.14 ml/m LA Vol (A4C):   97.5 ml 58.87 ml/m LA Biplane Vol: 81.7 ml 49.33 ml/m  AORTIC VALVE AV Area (Vmax): 2.85 cm AV Vmax:        63.70 cm/s AV Peak Grad:   1.6 mmHg LVOT Vmax:      57.80 cm/s LVOT Vmean:     37.767 cm/s LVOT VTI:       0.086 m   AORTA Ao Root diam: 3.40 cm Ao Asc diam:  4.70 cm  MITRAL VALVE               TRICUSPID VALVE MV Area (PHT): 5.88 cm    TR Peak grad:   18.8 mmHg MV Decel Time: 129 msec    TR Vmax:        217.00 cm/s MV E  velocity: 89.10 cm/s                            SHUNTS                            Systemic VTI:  0.09 m                            Systemic Diam: 2.00 cm  Lonni Nanas MD Electronically signed by Lonni Nanas MD Signature Date/Time: 04/27/2024/6:09:17 PM       Final (Updated)     *Note: Due to a large number of results and/or encounters for the requested time period, some results have not been displayed. A complete set of results can be found in Results Review.    *** CAD  -  On Aspirin , statin, betablocker, Plavix                  - *followed by cardiology                - last cardiac cath       ***DM 2 -  Lab Results  Component Value Date   HGBA1C 6.2 (H) 04/02/2024   ****on insulin, PO meds only, diet controlled  ***Hypothyroidism:   Lab Results  Component Value Date   TSH 0.897 08/19/2020   on synthroid  *** Morbid obesity-   BMI Readings from Last 1 Encounters:  04/25/24 19.58 kg/m     *** Asthma -well *** controlled on home inhalers/ nebs                     *** COPD - not **followed by pulmonology *** not  on baseline oxygen  *L,    *** OSA -on nocturnal oxygen, *CPAP, *noncompliant with CPAP  *** Hx of CVA - *with/out residual deficits on Aspirin  81 mg, 325, Plavix   ***A. Fib -   atrial fibrillation CHA2DS2 vas score **** CHA2DS2/VAS Stroke Risk Points  Current as of 8 minutes ago     7 >= 2 Points: High Risk  1 to 1.99 Points: Medium Risk  0 Points: Low Risk    Last Change: N/A      Details    This score determines the patient's risk of having a stroke if the  patient has atrial fibrillation.       Points Metrics  0 Has Congestive Heart Failure:  Kimberly    Current as of 8 minutes ago  1 Has Vascular Disease:  Yes    Current as of 8 minutes ago  1 Has Hypertension:  Yes    Current as of 8 minutes ago  1 Age:  64    Current as of 8 minutes ago  1 Has Diabetes Excluding Gestational Diabetes:  Yes     Current as of 8 minutes ago  2  Had Stroke:  Yes  Had TIA:  Kimberly  Had Thromboembolism:  Kimberly    Current as of 8 minutes ago  1 Female:  Yes    Current as of 8 minutes ago           current  on anticoagulation with ****Coumadin  ***Xarelto,* Eliquis ,  *** Not on anticoagulation secondary to Risk of Falls, *** recurrent bleeding         -  Rate control:  Currently controlled with ***Toprolol,  *Metoprolol ,* Diltiazem , *Coreg           - Rhythm control: *** amiodarone, *flecainide  ***Hx of DVT/PE on - anticoagulation with ****Coumadin  ***Xarelto,* Eliquis ,     ***CKD stage III*-   baseline Cr **** CrCl cannot be calculated (Unknown ideal weight.).  Lab Results  Component Value Date   CREATININE 1.54 (H) 05/27/2024   CREATININE 0.76 05/01/2024   CREATININE 0.89 04/30/2024   Lab Results  Component Value Date   NA 135 05/27/2024   CL 104 05/27/2024   K 3.4 (L) 05/27/2024   CO2 19 (L) 05/27/2024   BUN 34 (H) 05/27/2024   CREATININE 1.54 (H) 05/27/2024   GFRNONAA 37 (L) 05/27/2024   CALCIUM  9.6 05/27/2024   PHOS 3.8 05/01/2024   ALBUMIN 3.4 (L) 05/01/2024   GLUCOSE 108 (H) 05/27/2024    **** Liver disease MELD 3.0: 17 at 04/27/2024 12:52 PM MELD-Na: 17 at 04/27/2024  12:52 PM Calculated from: Serum Creatinine: 1 mg/dL at 0/85/7974 87:47 PM Serum Sodium: 142 mmol/L (Using max of 137 mmol/L) at 04/27/2024 12:52 PM Total Bilirubin: 0.5 mg/dL (Using min of 1 mg/dL) at 0/85/7974 87:47 PM Serum Albumin: 3 g/dL at 0/85/7974 87:47 PM INR(ratio): 2.6 at 04/25/2024  4:13 PM Age at listing (hypothetical): 66 years Sex: Female at 04/27/2024 12:52 PM  Hepatic Function Panel     Component Value Date/Time   PROT 6.5 05/01/2024 0649   PROT 6.6 04/02/2024 1532   ALBUMIN 3.4 (L) 05/01/2024 0649   ALBUMIN 4.4 04/02/2024 1532   AST 37 05/01/2024 0649   ALT 49 (H) 05/01/2024 0649   ALKPHOS 71 05/01/2024 0649   BILITOT 0.8 05/01/2024 0649   BILITOT 0.4 04/02/2024 1532   BILIDIR 0.1 02/07/2008 1125   2.6  ***BPH - on  Flomax, Proscar    *** Dementia - on Aricept** Nemenda  *** Chronic anemia - baseline hg Hemoglobin & Hematocrit  Recent Labs    04/30/24 0821 05/01/24 0649 05/27/24 2045  HGB 10.2* 11.0* 10.4*   Iron/TIBC/Ferritin/ %Sat    Component Value Date/Time   IRON 59 08/21/2020 0554   IRON 103 07/24/2018 1158   TIBC 384 08/21/2020 0554   TIBC 314 07/24/2018 1158   FERRITIN 19 08/21/2020 0554   FERRITIN 77 07/24/2018 1158   IRONPCTSAT 15 08/21/2020 0554   IRONPCTSAT 33 07/24/2018 1158     Seizure DO - las seizure *** currently on     Cancer:     While in ER:         Lab Orders         Basic metabolic panel         CBC         Magnesium          CK         I-Stat CG4 Lactic Acid      CT HEAD *** NON acute   MRI brain  ***Kimberly acute CVA  CXR - ***NON acute  CTabd/pelvis - ***nonacute  CTA chest - ***nonacute, Kimberly PE, * Kimberly evidence of infiltrate  Following Medications were ordered in ER: Medications  acetaminophen  (TYLENOL ) tablet 1,000 mg (1,000 mg Oral Given 05/27/24 2123)  ketorolac  (TORADOL ) 15 MG/ML injection 15 mg (15 mg Intravenous Given 05/27/24 2123)  oxyCODONE  (Oxy IR/ROXICODONE ) immediate release tablet 5 mg (5 mg Oral Given 05/27/24 2123)  metoprolol  tartrate (LOPRESSOR ) injection 10 mg (10 mg Intravenous Given 05/27/24 2121)  metoprolol  tartrate (LOPRESSOR ) tablet 25 mg (25 mg Oral Given 05/27/24 2145)  metoprolol  tartrate (LOPRESSOR ) injection 5 mg (5 mg Intravenous Given 05/27/24 2158)  sodium chloride  0.9 % bolus 1,000 mL (1,000 mLs Intravenous New Bag/Given 05/27/24 2240)  magnesium  sulfate IVPB 2 g 50 mL (0 g Intravenous Stopped 05/27/24 2300)    _______________________________________________________ ER Provider Called:       DrGOMEZ  They Recommend admit to medicine *** Will see in AM  ***SEEN in ER     ED Triage Vitals  Encounter Vitals Group     BP 05/27/24 2039 112/84     Girls Systolic BP Percentile --      Girls Diastolic BP  Percentile --      Boys Systolic BP Percentile --      Boys Diastolic BP Percentile --      Pulse Rate 05/27/24 2039 (!) 133     Resp 05/27/24 2039 18     Temp 05/27/24 2042 98.2 F (36.8 C)  Temp Source 05/27/24 2042 Oral     SpO2 05/27/24 2039 100 %     Weight --      Height --      Head Circumference --      Peak Flow --      Pain Score 05/27/24 2036 9     Pain Loc --      Pain Education --      Exclude from Growth Chart --   UFJK(75)@     _________________________________________ Significant initial  Findings: Abnormal Labs Reviewed  BASIC METABOLIC PANEL WITH GFR - Abnormal; Notable for the following components:      Result Value   Potassium 3.4 (*)    CO2 19 (*)    Glucose, Bld 108 (*)    BUN 34 (*)    Creatinine, Ser 1.54 (*)    GFR, Estimated 37 (*)    All other components within normal limits  CBC - Abnormal; Notable for the following components:   WBC 12.1 (*)    Hemoglobin 10.4 (*)    HCT 33.8 (*)    MCV 72.7 (*)    MCH 22.4 (*)    RDW 16.3 (*)    All other components within normal limits  TROPONIN I (HIGH SENSITIVITY) - Abnormal; Notable for the following components:   Troponin I (High Sensitivity) 354 (*)    All other components within normal limits      _________________________ Troponin ***ordered Cardiac Panel (last 3 results) Recent Labs    05/27/24 2045  TROPONINIHS 354*     ECG: Ordered Personally reviewed and interpreted by me showing: HR : *** Rhythm: *NSR, Sinus tachycardia * A.fib. W RVR, RBBB, LBBB, Paced Ischemic changes*nonspecific changes, Kimberly evidence of ischemic changes QTC*  BNP (last 3 results) Recent Labs    10/09/23 1628  BNP 347.8*      Kimberly results for input(s): DDIMER, FERRITIN, LDH, CRP in the last 72 hours.    ____________________ This patient meets SIRS Criteria and may be septic. SIRS = Systemic Inflammatory Response Syndrome  Order a lactic acid level if needed AND/OR Initiate the sepsis  protocol with the attached order set OR Click Treating Associated Infection or Illness if the patient is being treated for an infection that is a known cause of these abnormalities     The recent clinical data is shown below. Vitals:   05/27/24 2145 05/27/24 2214 05/27/24 2215 05/27/24 2245  BP: (!) 119/94 118/88 101/88 (!) 121/100  Pulse: (!) 118 (!) 119 (!) 126   Resp:  16 14 (!) 21  Temp:      TempSrc:      SpO2:  100% 100%         WBC     Component Value Date/Time   WBC 12.1 (H) 05/27/2024 2045   LYMPHSABS 2.1 05/01/2024 0649   LYMPHSABS 2.8 10/09/2023 1628   MONOABS 0.7 05/01/2024 0649   EOSABS 0.3 05/01/2024 0649   EOSABS 0.2 10/09/2023 1628   BASOSABS 0.1 05/01/2024 0649   BASOSABS 0.1 10/09/2023 1628        Lactic Acid, Venous    Component Value Date/Time   LATICACIDVEN 2.9 (HH) 04/26/2024 0850     Lactic Acid, Venous    Component Value Date/Time   LATICACIDVEN 2.9 (HH) 04/26/2024 0850    Procalcitonin *** Ordered      UA *** Kimberly evidence of UTI  ***Pending ***not ordered   Urine analysis:    Component Value Date/Time  COLORURINE YELLOW 04/25/2024 1832   APPEARANCEUR CLEAR 04/25/2024 1832   LABSPEC 1.030 04/25/2024 1832   PHURINE 5.0 04/25/2024 1832   GLUCOSEU NEGATIVE 04/25/2024 1832   HGBUR MODERATE (A) 04/25/2024 1832   BILIRUBINUR NEGATIVE 04/25/2024 1832   BILIRUBINUR negative 10/10/2023 1041   BILIRUBINUR negative 11/18/2020 1718   KETONESUR NEGATIVE 04/25/2024 1832   PROTEINUR 30 (A) 04/25/2024 1832   UROBILINOGEN 0.2 10/10/2023 1041   UROBILINOGEN 0.2 05/26/2011 2243   NITRITE NEGATIVE 04/25/2024 1832   LEUKOCYTESUR NEGATIVE 04/25/2024 1832    Results for orders placed or performed during the hospital encounter of 04/25/24  Culture, blood (single)     Status: None   Collection Time: 04/25/24  6:32 PM   Specimen: BLOOD RIGHT ARM  Result Value Ref Range Status   Specimen Description BLOOD RIGHT ARM  Final   Special Requests    Final    BOTTLES DRAWN AEROBIC AND ANAEROBIC Blood Culture adequate volume   Culture   Final    Kimberly GROWTH 5 DAYS Performed at [facility], [address], [place], [place] [address]    Report Status 04/30/2024 FINAL  Final  Resp panel by RT-PCR (RSV, Flu A&B, Covid) Anterior Nasal Swab     Status: None   Collection Time: 04/25/24 11:09 PM   Specimen: Anterior Nasal Swab  Result Value Ref Range Status   SARS Coronavirus 2 by RT PCR NEGATIVE NEGATIVE Final   Influenza A by PCR NEGATIVE NEGATIVE Final   Influenza B by PCR NEGATIVE NEGATIVE Final    Comment: (NOTE) The Xpert Xpress SARS-CoV-2/FLU/RSV plus assay is intended as an aid in the diagnosis of influenza from Nasopharyngeal swab specimens and should not be used as a sole basis for treatment. Nasal washings and aspirates are unacceptable for Xpert Xpress SARS-CoV-2/FLU/RSV testing.  Fact Sheet for Patients: BloggerCourse.com  Fact Sheet for Healthcare Providers: SeriousBroker.it  This test is not yet approved or cleared by the [place]  FDA and has been authorized for detection and/or diagnosis of SARS-CoV-2 by FDA under an Emergency Use Authorization (EUA). This EUA will remain in effect (meaning this test can be used) for the duration of the COVID-19 declaration under Section 564(b)(1) of the Act, 21 U.S.C. section 360bbb-3(b)(1), unless the authorization is terminated or revoked.     Resp Syncytial Virus by PCR NEGATIVE NEGATIVE Final    Comment: (NOTE) Fact Sheet for Patients: BloggerCourse.com  Fact Sheet for Healthcare Providers: SeriousBroker.it  This test is not yet approved or cleared by the [place]  FDA and has been authorized for detection and/or diagnosis of SARS-CoV-2 by FDA under an Emergency Use Authorization (EUA). This EUA will remain in effect (meaning this test can be used) for the  duration of the COVID-19 declaration under Section 564(b)(1) of the Act, 21 U.S.C. section 360bbb-3(b)(1), unless the authorization is terminated or revoked.  Performed at [facility], [address], [place], [place] [address]   Respiratory (~20 pathogens) panel by PCR     Status: Abnormal   Collection Time: 04/27/24  7:04 AM   Specimen: Nasopharyngeal Swab; Respiratory  Result Value Ref Range Status   Adenovirus NOT DETECTED NOT DETECTED Final   Coronavirus 229E NOT DETECTED NOT DETECTED Final    Comment: (NOTE) The Coronavirus on the Respiratory Panel, DOES NOT test for the novel  Coronavirus (2019 nCoV)    Coronavirus HKU1 NOT DETECTED NOT DETECTED Final   Coronavirus NL63 NOT DETECTED NOT DETECTED Final   Coronavirus OC43  NOT DETECTED NOT DETECTED Final   Metapneumovirus NOT DETECTED NOT DETECTED Final   Rhinovirus / Enterovirus DETECTED (A) NOT DETECTED Final   Influenza A NOT DETECTED NOT DETECTED Final   Influenza B NOT DETECTED NOT DETECTED Final   Parainfluenza Virus 1 NOT DETECTED NOT DETECTED Final   Parainfluenza Virus 2 NOT DETECTED NOT DETECTED Final   Parainfluenza Virus 3 NOT DETECTED NOT DETECTED Final   Parainfluenza Virus 4 NOT DETECTED NOT DETECTED Final   Respiratory Syncytial Virus NOT DETECTED NOT DETECTED Final   Bordetella pertussis NOT DETECTED NOT DETECTED Final   Bordetella Parapertussis NOT DETECTED NOT DETECTED Final   Chlamydophila pneumoniae NOT DETECTED NOT DETECTED Final   Mycoplasma pneumoniae NOT DETECTED NOT DETECTED Final    Comment: Performed at [facility], [address], [place], [place] [address]  MRSA Next Gen by PCR, Nasal     Status: None   Collection Time: 04/27/24 10:15 AM   Specimen: Nasal Mucosa; Nasal Swab  Result Value Ref Range Status   MRSA by PCR Next Gen NOT DETECTED NOT DETECTED Final    Comment: (NOTE) The GeneXpert MRSA Assay (FDA approved for NASAL specimens only), is one component of a comprehensive  MRSA colonization surveillance program. It is not intended to diagnose MRSA infection nor to guide or monitor treatment for MRSA infections. Test performance is not FDA approved in patients less than 75 years old. Performed at [facility], [address] [place], [place] [address]     ABX started Antibiotics Given (last 72 hours)     None        Kimberly results found for the last 90 days.    ________________________________________________________________  Arterial ***Venous  Blood Gas result:  pH *** pCO2 ***; pO2 ***;     %O2 Sat ***.  ABG    Component Value Date/Time   PHART 7.450 (H) 06/03/2007 1935   PCO2ART 31.6 (L) 06/03/2007 1935   PO2ART 76.0 (L) 06/03/2007 1839   HCO3 21.9 06/03/2007 1935   TCO2 16 (L) 04/25/2024 1623   ACIDBASEDEF 1.0 06/03/2007 1935   O2SAT 95.0 06/03/2007 1839       __________________________________________________________ Recent Labs  Lab 05/27/24 2045  NA 135  K 3.4*  CO2 19*  GLUCOSE 108*  BUN 34*  CREATININE 1.54*  CALCIUM  9.6  MG 1.8    Cr  * stable,  Up from baseline see below Lab Results  Component Value Date   CREATININE 1.54 (H) 05/27/2024   CREATININE 0.76 05/01/2024   CREATININE 0.89 04/30/2024    Kimberly results for input(s): AST, ALT, ALKPHOS, BILITOT, PROT, ALBUMIN in the last 168 hours. Lab Results  Component Value Date   CALCIUM  9.6 05/27/2024   PHOS 3.8 05/01/2024          Plt: Lab Results  Component Value Date   PLT 172 05/27/2024         Recent Labs  Lab 05/27/24 2045  WBC 12.1*  HGB 10.4*  HCT 33.8*  MCV 72.7*  PLT 172    HG/HCT * stable,  Down *Up from baseline see below    Component Value Date/Time   HGB 10.4 (L) 05/27/2024 2045   HGB 10.9 (L) 04/02/2024 1532   HCT 33.8 (L) 05/27/2024 2045   HCT 37.9 04/02/2024 1532   MCV 72.7 (L) 05/27/2024 2045   MCV 78 (L) 04/02/2024 1532      Kimberly results for input(s): LIPASE, AMYLASE in the last 168 hours.  Kimberly results  for input(s): AMMONIA in the last 168 hours.    _______________________________________________ Hospitalist was called for admission for *** Atrial fibrillation with rapid ventricular response (HCC) ***    The following Work up has been ordered so far:  Orders Placed This Encounter  Procedures  . Critical Care  . DG Chest 2 View  . Basic metabolic panel  . CBC  . Magnesium   . CK  . Document Height and Actual Weight  . If O2 Sat <94% administer O2 at 2 liters/minute via nasal cannula  . Consult to hospitalist  . I-Stat CG4 Lactic Acid  . ED EKG  . EKG 12-Lead     OTHER Significant initial  Findings:  labs showing:     DM  labs:  HbA1C: Recent Labs    10/09/23 1628 04/02/24 1532  HGBA1C 5.8* 6.2*       CBG (last 3)  Kimberly results for input(s): GLUCAP in the last 72 hours.        Cultures:    Component Value Date/Time   SDES BLOOD RIGHT ARM 04/25/2024 1832   SPECREQUEST  04/25/2024 1832    BOTTLES DRAWN AEROBIC AND ANAEROBIC Blood Culture adequate volume   CULT  04/25/2024 1832    Kimberly GROWTH 5 DAYS Performed at [facility], [address], [place], [place] [address]    REPTSTATUS 04/30/2024 FINAL 04/25/2024 1832     Radiological Exams on Admission: DG Chest 2 View Result Date: 05/27/2024 CLINICAL DATA:  Chest pain, shortness of breath EXAM: CHEST - 2 VIEW COMPARISON:  04/25/2024 FINDINGS: Prior CABG. Cardiomegaly with vascular congestion. Kimberly overt edema. Kimberly confluent opacities or effusions. Kimberly acute bony abnormality. IMPRESSION: Cardiomegaly, vascular congestion. Electronically Signed   By: Franky Crease M.D.   On: 05/27/2024 21:15   _______________________________________________________________________________________________________ Latest  Blood pressure (!) 121/100, pulse (!) 126, temperature 98.2 F (36.8 C), temperature source Oral, resp. rate (!) 21, SpO2 100%.   Vitals  labs and radiology finding personally reviewed  Review of Systems:     Pertinent positives include: ***  Constitutional:  Kimberly weight loss, night sweats, Fevers, chills, fatigue, weight loss  HEENT:  Kimberly headaches, Difficulty swallowing,Tooth/dental problems,Sore throat,  Kimberly sneezing, itching, ear ache, nasal congestion, post nasal drip,  Cardio-vascular:  Kimberly chest pain, Orthopnea, PND, anasarca, dizziness, palpitations.Kimberly Bilateral lower extremity swelling  GI:  Kimberly heartburn, indigestion, abdominal pain, nausea, vomiting, diarrhea, change in bowel habits, loss of appetite, melena, blood in stool, hematemesis Resp:  Kimberly shortness of breath at rest. Kimberly dyspnea on exertion, Kimberly excess mucus, Kimberly productive cough, Kimberly non-productive cough, Kimberly coughing up of blood.Kimberly change in color of mucus.Kimberly wheezing. Skin:  Kimberly rash or lesions. Kimberly jaundice GU:  Kimberly dysuria, change in color of urine, Kimberly urgency or frequency. Kimberly straining to urinate.  Kimberly flank pain.  Musculoskeletal:  Kimberly joint pain or Kimberly joint swelling. Kimberly decreased range of motion. Kimberly back pain.  Psych:  Kimberly change in mood or affect. Kimberly depression or anxiety. Kimberly memory loss.  Neuro: Kimberly localizing neurological complaints, Kimberly tingling, Kimberly weakness, Kimberly double vision, Kimberly gait abnormality, Kimberly slurred speech, Kimberly confusion  All systems reviewed and apart from HOPI all are negative _______________________________________________________________________________________________ Past Medical History:   Past Medical History:  Diagnosis Date  . Bicipital tenosynovitis   . Cerebral artery occlusion with cerebral infarction [facility]) 12/05/2007   Qualifier: Diagnosis of   By: Loretha MD, [place]      . Coronary  artery disease 05/2007   [id number]   . Fall 02/13/2018  . Family history of ischemic heart disease   . Gout   . Hammer toe   . Hip, thigh, leg, and ankle, insect bite, nonvenomous, without mention of infection(916.4)   . Hyperlipidemia   . Hypertension   . Osteoporosis 02/07/2022  . Other abnormality of red blood  cells   . Rotator cuff syndrome of left shoulder   . Stroke [facility])       Past Surgical History:  Procedure Laterality Date  . CARDIAC CATHETERIZATION    . COLONOSCOPY WITH PROPOFOL  N/A 01/30/2020   Procedure: COLONOSCOPY WITH PROPOFOL ;  Surgeon: Rollin Dover, MD;  Location: [facility]COPY;  Service: Endoscopy;  Laterality: N/A;  . CORONARY ARTERY BYPASS GRAFT     triple  . CORONARY ARTERY BYPASS GRAFT  2008  . POLYPECTOMY  01/30/2020   Procedure: POLYPECTOMY;  Surgeon: Rollin Dover, MD;  Location: [facility];  Service: Endoscopy;;  . TUBAL LIGATION      Social History:  Ambulatory *** independently cane, walker  wheelchair bound, bed bound     reports that she quit smoking about 4 years ago. Her smoking use included cigarettes. She started smoking about 39 years ago. She has a 70 pack-year smoking history. She quit smokeless tobacco use about 15 years ago. She reports that she does not currently use alcohol. She reports that she does not currently use drugs after having used the following drugs: Marijuana.     Family History: *** Family History  Problem Relation Age of Onset  . Hypertension Mother   . Heart attack Father   . Diabetes Father   . Kidney failure Father   . Anxiety disorder Sister   . Sarcoidosis Sister    ______________________________________________________________________________________________ Allergies: Allergies  Allergen Reactions  . Catapres -Tts [Clonidine ] Other (See Comments)    Reaction caused by clonidine  patch but not a direct result of the medication Patch adhesive caused skin tears resulting in scarring  . Aldactone  [Spironolactone ] Rash     Prior to Admission medications   Medication Sig Start Date End Date Taking? Authorizing Provider  albuterol  (VENTOLIN  HFA) 108 (90 Base) MCG/ACT inhaler INHALE 2 PUFFS BY MOUTH INTO THE LUNGS EVERY 6 HOURS AS NEEDED FOR WHEEZING OR SHORTNESS OF BREATH 07/31/23   Shelah Lamar RAMAN, MD  alendronate   (FOSAMAX ) 70 MG tablet TAKE 1 TABLET BY MOUTH EVERY 7 DAYS. Take WITH A full GLASS OF WATER ON EMPTY stomach Patient taking differently: Take 70 mg by mouth every Monday. 03/24/24   Kimberly Speaks, FNP  ANORO ELLIPTA  62.5-25 MCG/ACT AEPB INHALE 1 PUFF BY MOUTH INTO THE LUNGS ONCE DAILY. RINSE MOUTH AFTER USE 12/18/23   Kimberly Speaks, FNP  benzonatate  (TESSALON  PERLES) 100 MG capsule Take 1 capsule (100 mg total) by mouth 3 (three) times daily as needed. 04/02/24 04/02/25  Petrina Pries, NP  calcium -vitamin D  (OSCAL WITH D) 500-5 MG-MCG tablet Take 1 tablet by mouth 2 (two) times daily with a meal. 05/03/24   Elgergawy, Brayton RAMAN, MD  carvedilol  (COREG ) 25 MG tablet Take 1 tablet (25 mg total) by mouth 2 (two) times daily with a meal. 05/03/24   Elgergawy, Brayton RAMAN, MD  docusate sodium  (COLACE) 100 MG capsule Take 2 capsules (200 mg total) by mouth 2 (two) times daily. 05/03/24   Elgergawy, Brayton RAMAN, MD  ELIQUIS  5 MG TABS tablet TAKE 1 TABLET BY MOUTH 2 TIMES DAILY 04/17/24   Moore, [name], FNP  Ferric Maltol  (ACCRUFER )  30 MG CAPS Take 1 capsule (30 mg total) by mouth daily. 04/21/24   Petrina Pries, NP  fluticasone  (FLONASE ) 50 MCG/ACT nasal spray SPRAY 2 SPRAYS into EACH nostril DAILY Patient taking differently: Place 2 sprays into both nostrils daily as needed for allergies. 09/27/22   Moore, [name], FNP  hydrocortisone cream 1 % Apply 1 application topically daily as needed for itching.    [provider]  mirtazapine  (REMERON ) 15 MG tablet TAKE 1 TABLET BY MOUTH AT BEDTIME 03/24/24   Kimberly Speaks, FNP  pantoprazole  (PROTONIX ) 40 MG tablet TAKE 1 TABLET BY MOUTH EVERY DAY 03/24/24   Kimberly Speaks, FNP  polyethylene glycol (MIRALAX  / GLYCOLAX ) 17 g packet Take 17 g by mouth daily. Hold for diarrhea 05/03/24   Elgergawy, Brayton RAMAN, MD  sacubitril -valsartan  (ENTRESTO ) 24-26 MG Take 1 tablet by mouth 2 (two) times daily. 05/03/24   Elgergawy, Brayton RAMAN, MD  Vitamin D , Ergocalciferol , (DRISDOL ) 1.25 MG (50000 UNIT)  CAPS capsule TAKE 1 CAPSULE BY MOUTH TWICE A WEEK Patient taking differently: Take 50,000 Units by mouth See admin instructions. Take 1 capsule (50,000u) by mouth twice a week on Monday and Thursday. 03/24/24   Kimberly Speaks, FNP    ___________________________________________________________________________________________________ Physical Exam:    05/27/2024   10:45 PM 05/27/2024   10:15 PM 05/27/2024   10:14 PM  Vitals with BMI  Systolic 121 101 881  Diastolic 100 88 88  Pulse  126 119     1. General:  in Kimberly ***Acute distress***increased work of breathing ***complaining of severe pain****agitated * Chronically ill *well *cachectic *toxic acutely ill -appearing 2. Psychological: Alert and *** Oriented 3. Head/ENT:   Moist *** Dry Mucous Membranes                          Head Non traumatic, neck supple                          Normal *** Poor Dentition 4. SKIN: normal *** decreased Skin turgor,  Skin clean Dry and intact Kimberly rash    5. Heart: Regular rate and rhythm Kimberly*** Murmur, Kimberly Rub or gallop 6. Lungs: ***Clear to auscultation bilaterally, Kimberly wheezes or crackles   7. Abdomen: Soft, ***non-tender, Non distended *** obese ***bowel sounds present 8. Lower extremities: Kimberly clubbing, cyanosis, Kimberly ***edema 9. Neurologically Grossly intact, moving all 4 extremities equally *** strength 5 out of 5 in all 4 extremities cranial nerves II through XII intact 10. MSK: Normal range of motion    Chart has been reviewed  ______________________________________________________________________________________________  Assessment/Plan  ***  Admitted for *** Atrial fibrillation with rapid ventricular response (HCC) ***    Present on Admission: **None**     Kimberly problem-specific Assessment & Plan notes found for this encounter.    Other plan as per orders.  DVT prophylaxis:  SCD *** Lovenox        Code Status:    Code Status: Prior FULL CODE *** DNR/DNI ***comfort care as per  patient ***family  I had personally discussed CODE STATUS with patient and family*  ACP *** none has been reviewed ***   Family Communication:   Family not at  Bedside  plan of care was discussed on the phone with *** Son, Daughter, Wife, Husband, Sister, Brother , father, mother  Diet    Disposition Plan:   *** likely will need placement for rehabilitation  Back to current facility when stable                            To home once workup is complete and patient is stable  ***Following barriers for discharge:                             Chest pain *** Stroke *** Syncope ***work up is complete                            Electrolytes corrected                               Anemia corrected h/H stable                             Pain controlled with PO medications                               Afebrile, white count improving able to transition to PO antibiotics                             Will need to be able to tolerate PO                            Will likely need home health, home O2, set up                           Will need consultants to evaluate patient prior to discharge                           Work of breathing improves       Consult Orders  (From admission, onward)           Start     Ordered   05/27/24 2227  Consult to hospitalist  Pg by Elspeth  Once       Provider:  (Not yet assigned)  Question Answer Comment  Place call to: [facility]   Reason for Consult Admit      05/27/24 2226                              ***Would benefit from PT/OT eval prior to DC  Ordered                   Swallow eval - SLP ordered                   Diabetes care coordinator                   Transition of care consulted                   Nutrition    consulted                  Wound care  consulted  Palliative care    consulted                   Behavioral health  consulted                    Consults called: ***   NONE   Admission status:  ED Disposition     None        Obs***  ***  inpatient     I Expect 2 midnight stay secondary to severity of patient's current illness need for inpatient interventions justified by the following: ***hemodynamic instability despite optimal treatment (tachycardia *hypotension * tachypnea *hypoxia, hypercapnia) *** Severe lab/radiological/exam abnormalities including:    Atrial fibrillation with rapid ventricular response (HCC) ***  and extensive comorbidities including: *substance abuse  *Chronic pain *DM2  * CHF * CAD  * COPD/asthma *Morbid Obesity * CKD *dementia *liver disease *history of stroke with residual deficits *  malignancy, * sickle cell disease  History of amputation Chronic anticoagulation  That are currently affecting medical management.   I expect  patient to be hospitalized for 2 midnights requiring inpatient medical care.  Patient is at high risk for adverse outcome (such as loss of life or disability) if not treated.  Indication for inpatient stay as follows:  Severe change from baseline regarding mental status Hemodynamic instability despite maximal medical therapy,  severe pain requiring acute inpatient management,  inability to maintain oral hydration   persistent chest pain despite medical management Need for operative/procedural  intervention New or worsening hypoxia ongoing suicidal ideations   Need for IV antibiotics, IV fluids,, IV pain medications, IV anticoagulation,  IV rate controling medications, IV antihypertensives need for biPAP Frequent labs    Level of care      progressive     Sharry Beining 05/27/2024, 11:14 PM ***  [facility]     after 2 AM please page floor coverage   If 7AM-7PM, please contact the day team taking care of the patient using [email or web address]

## 2024-05-27 NOTE — ED Provider Notes (Signed)
 [place] EMERGENCY DEPARTMENT AT [facility] Provider Note   CSN: [id number] Arrival date & time: 05/27/24  2031     Patient presents with: Back Pain   Kimberly [name] is a 68 y.o. female.   68 yo F with a chief complaints of low back pain,  constipation and feeling like her heart is racing.  Patient states that she was just in the hospital after falling and had just got out of rehab a couple days ago.  Since she has been home she feels like her back pain has gotten a little bit worse than her heart has been racing off and on.  She has a history of A-fib she denies any missed doses of her medicines.  She is not sure exactly what she takes to keep her heart rate slow.  She think she has been taking her blood thinner every day.  She denies cough congestion or fever.  Denies trauma.   Back Pain      Prior to Admission medications   Medication Sig Start Date End Date Taking? Authorizing Provider  albuterol  (VENTOLIN  HFA) 108 (90 Base) MCG/ACT inhaler INHALE 2 PUFFS BY MOUTH INTO THE LUNGS EVERY 6 HOURS AS NEEDED FOR WHEEZING OR SHORTNESS OF BREATH 07/31/23   Shelah Lamar RAMAN, MD  alendronate  (FOSAMAX ) 70 MG tablet TAKE 1 TABLET BY MOUTH EVERY 7 DAYS. Take WITH A full GLASS OF WATER ON EMPTY stomach Patient taking differently: Take 70 mg by mouth every Monday. 03/24/24   Georgina Speaks, FNP  ANORO ELLIPTA  62.5-25 MCG/ACT AEPB INHALE 1 PUFF BY MOUTH INTO THE LUNGS ONCE DAILY. RINSE MOUTH AFTER USE 12/18/23   Georgina Speaks, FNP  benzonatate  (TESSALON  PERLES) 100 MG capsule Take 1 capsule (100 mg total) by mouth 3 (three) times daily as needed. 04/02/24 04/02/25  Petrina Pries, NP  calcium -vitamin D  (OSCAL WITH D) 500-5 MG-MCG tablet Take 1 tablet by mouth 2 (two) times daily with a meal. 05/03/24   Elgergawy, Brayton RAMAN, MD  carvedilol  (COREG ) 25 MG tablet Take 1 tablet (25 mg total) by mouth 2 (two) times daily with a meal. 05/03/24   Elgergawy, Brayton RAMAN, MD  docusate sodium  (COLACE)  100 MG capsule Take 2 capsules (200 mg total) by mouth 2 (two) times daily. 05/03/24   Elgergawy, Brayton RAMAN, MD  ELIQUIS  5 MG TABS tablet TAKE 1 TABLET BY MOUTH 2 TIMES DAILY 04/17/24   Moore, [name], FNP  Ferric Maltol  (ACCRUFER ) 30 MG CAPS Take 1 capsule (30 mg total) by mouth daily. 04/21/24   Petrina Pries, NP  fluticasone  (FLONASE ) 50 MCG/ACT nasal spray SPRAY 2 SPRAYS into EACH nostril DAILY Patient taking differently: Place 2 sprays into both nostrils daily as needed for allergies. 09/27/22   Moore, [name], FNP  hydrocortisone cream 1 % Apply 1 application topically daily as needed for itching.    [provider]  mirtazapine  (REMERON ) 15 MG tablet TAKE 1 TABLET BY MOUTH AT BEDTIME 03/24/24   Moore, [name], FNP  pantoprazole  (PROTONIX ) 40 MG tablet TAKE 1 TABLET BY MOUTH EVERY DAY 03/24/24   Moore, [name], FNP  polyethylene glycol (MIRALAX  / GLYCOLAX ) 17 g packet Take 17 g by mouth daily. Hold for diarrhea 05/03/24   Elgergawy, Brayton RAMAN, MD  sacubitril -valsartan  (ENTRESTO ) 24-26 MG Take 1 tablet by mouth 2 (two) times daily. 05/03/24   Elgergawy, Brayton RAMAN, MD  Vitamin D , Ergocalciferol , (DRISDOL ) 1.25 MG (50000 UNIT) CAPS capsule TAKE 1 CAPSULE BY MOUTH TWICE A WEEK Patient taking  differently: Take 50,000 Units by mouth See admin instructions. Take 1 capsule (50,000u) by mouth twice a week on Monday and Thursday. 03/24/24   Georgina Speaks, FNP    Allergies: Catapres -tts [clonidine ] and Aldactone  [spironolactone ]    Review of Systems  Musculoskeletal:  Positive for back pain.    Updated Vital Signs BP (!) 121/100   Pulse (!) 126   Temp 98.2 F (36.8 C) (Oral)   Resp (!) 21   SpO2 100%   Physical Exam Vitals and nursing note reviewed.  Constitutional:      General: She is not in acute distress.    Appearance: She is well-developed. She is not diaphoretic.  HENT:     Head: Normocephalic and atraumatic.  Eyes:     Pupils: Pupils are equal, round, and reactive to light.   Cardiovascular:     Rate and Rhythm: Tachycardia present. Rhythm irregular.     Heart sounds: No murmur heard.    No friction rub. No gallop.  Pulmonary:     Effort: Pulmonary effort is normal.     Breath sounds: No wheezing or rales.  Abdominal:     General: There is no distension.     Palpations: Abdomen is soft.     Tenderness: There is no abdominal tenderness.  Musculoskeletal:        General: No tenderness.     Cervical back: Normal range of motion and neck supple.  Skin:    General: Skin is warm and dry.  Neurological:     Mental Status: She is alert and oriented to person, place, and time.  Psychiatric:        Behavior: Behavior normal.     (all labs ordered are listed, but only abnormal results are displayed) Labs Reviewed  BASIC METABOLIC PANEL WITH GFR - Abnormal; Notable for the following components:      Result Value   Potassium 3.4 (*)    CO2 19 (*)    Glucose, Bld 108 (*)    BUN 34 (*)    Creatinine, Ser 1.54 (*)    GFR, Estimated 37 (*)    All other components within normal limits  CBC - Abnormal; Notable for the following components:   WBC 12.1 (*)    Hemoglobin 10.4 (*)    HCT 33.8 (*)    MCV 72.7 (*)    MCH 22.4 (*)    RDW 16.3 (*)    All other components within normal limits  TROPONIN I (HIGH SENSITIVITY) - Abnormal; Notable for the following components:   Troponin I (High Sensitivity) 354 (*)    All other components within normal limits  MAGNESIUM   CK  I-STAT CG4 LACTIC ACID, ED  TROPONIN I (HIGH SENSITIVITY)    EKG: EKG Interpretation Date/Time:  Tuesday May 27 2024 21:51:12 EDT Ventricular Rate:  133 PR Interval:    QRS Duration:  102 QT Interval:  353 QTC Calculation: 526 R Axis:   43  Text Interpretation: Atrial fibrillation Repol abnrm suggests ischemia, diffuse leads Prolonged QT interval Since last tracing rate slower Otherwise no significant change Confirmed by Emil Share [phone]) on 05/27/2024 9:54:23 PM  Radiology: [facility]  Chest 2 View Result Date: 05/27/2024 CLINICAL DATA:  Chest pain, shortness of breath EXAM: CHEST - 2 VIEW COMPARISON:  04/25/2024 FINDINGS: Prior CABG. Cardiomegaly with vascular congestion. No overt edema. No confluent opacities or effusions. No acute bony abnormality. IMPRESSION: Cardiomegaly, vascular congestion. Electronically Signed   By: Franky Crease M.D.   On:  05/27/2024 21:15     .Critical Care  Performed by: Emil Share, DO Authorized by: Emil Share, DO   Critical care provider statement:    Critical care time (minutes):  35   Critical care time was exclusive of:  Separately billable procedures and treating other patients   Critical care was time spent personally by me on the following activities:  Development of treatment plan with patient or surrogate, discussions with consultants, evaluation of patient's response to treatment, examination of patient, ordering and review of laboratory studies, ordering and review of radiographic studies, ordering and performing treatments and interventions, pulse oximetry, re-evaluation of patient's condition and review of old charts   Care discussed with: admitting provider      Medications Ordered in the ED  acetaminophen  (TYLENOL ) tablet 1,000 mg (1,000 mg Oral Given 05/27/24 2123)  ketorolac  (TORADOL ) 15 MG/ML injection 15 mg (15 mg Intravenous Given 05/27/24 2123)  oxyCODONE  (Oxy IR/ROXICODONE ) immediate release tablet 5 mg (5 mg Oral Given 05/27/24 2123)  metoprolol  tartrate (LOPRESSOR ) injection 10 mg (10 mg Intravenous Given 05/27/24 2121)  metoprolol  tartrate (LOPRESSOR ) tablet 25 mg (25 mg Oral Given 05/27/24 2145)  metoprolol  tartrate (LOPRESSOR ) injection 5 mg (5 mg Intravenous Given 05/27/24 2158)  sodium chloride  0.9 % bolus 1,000 mL (1,000 mLs Intravenous New Bag/Given 05/27/24 2240)  magnesium  sulfate IVPB 2 g 50 mL (0 g Intravenous Stopped 05/27/24 2300)                                    Medical Decision Making Amount  and/or Complexity of Data Reviewed Labs: ordered. Radiology: ordered.  Risk OTC drugs. Prescription drug management.   68 yo F recently in the hospital for a fall.  This occurred at home she was on the ground for about 4 hours and could not get up.  Ended up coming in at a lactic acidosis had significant troponin elevation.  Was watched in the hospital and sent to rehab.  She has recently finished a rehab stay and got home and came here for evaluation.  Patient had developed palpitations over the past few days.  She also has had continuation of her low back pain.  She would like some medication for her back and was asking if I had any recommendations for her constipation.  I discussed cleanout possibilities at home.  Will attempt to slow her rate here with medications.  Treat her back pain.  Multiple doses of metoprolol  with very mild if any improvement of heart rate.  Mostly 110-130.  Patient with acute kidney injury.  Will give a bolus of IV fluids.  Troponin elevated though much less than it was a month ago.  I discussed case with Dr. Gail, cardiology will come and consult at bedside.  Will discuss with medicine for admission.  The patients results and plan were reviewed and discussed.   Any x-rays performed were independently reviewed by myself.   Differential diagnosis were considered with the presenting HPI.  Medications  acetaminophen  (TYLENOL ) tablet 1,000 mg (1,000 mg Oral Given 05/27/24 2123)  ketorolac  (TORADOL ) 15 MG/ML injection 15 mg (15 mg Intravenous Given 05/27/24 2123)  oxyCODONE  (Oxy IR/ROXICODONE ) immediate release tablet 5 mg (5 mg Oral Given 05/27/24 2123)  metoprolol  tartrate (LOPRESSOR ) injection 10 mg (10 mg Intravenous Given 05/27/24 2121)  metoprolol  tartrate (LOPRESSOR ) tablet 25 mg (25 mg Oral Given 05/27/24 2145)  metoprolol  tartrate (LOPRESSOR ) injection 5 mg (5  mg Intravenous Given 05/27/24 2158)  sodium chloride  0.9 % bolus 1,000 mL (1,000 mLs  Intravenous New Bag/Given 05/27/24 2240)  magnesium  sulfate IVPB 2 g 50 mL (0 g Intravenous Stopped 05/27/24 2300)    Vitals:   05/27/24 2145 05/27/24 2214 05/27/24 2215 05/27/24 2245  BP: (!) 119/94 118/88 101/88 (!) 121/100  Pulse: (!) 118 (!) 119 (!) 126   Resp:  16 14 (!) 21  Temp:      TempSrc:      SpO2:  100% 100%     Final diagnoses:  Atrial fibrillation with rapid ventricular response (HCC)    Admission/ observation were discussed with the admitting physician, patient and/or family and they are comfortable with the plan.       Final diagnoses:  Atrial fibrillation with rapid ventricular response [facility])    ED Discharge Orders     None          Emil Share, DO 05/27/24 2312

## 2024-05-27 NOTE — Consult Note (Addendum)
 Cardiology Consultation   Patient ID: Kimberly [name] MRN: [id number]; DOB: 1956/04/07  Admit date: 05/27/2024 Date of Consult: 05/27/2024  PCP:  Georgina Speaks, FNP   [facility] Providers Cardiologist:  Lynwood Schilling, MD        Patient Profile: Kimberly [name] is a 68 y.o. female with a hx of paroxysmal atrial fibrillation, coronary artery disease s/p CABG in 2008, apical variant HCM, congestive heart failure, CVA in 2019, HTN, hyperlipidemia, and tobacco use who is being seen 05/27/2024 for the evaluation of elevated troponin at the request of the emergency department.  History of Present Illness: Kimberly [name] presented to the ER for management of her low back pain.  She states she also came to the ER because she was told she was in atrial fibrillation.  She also endorsed dizziness when standing. She denies palpitations, chest pain, nausea, vomiting, fever, orthopnea, paroxysmal nocturnal dyspnea or syncope.  Patient was recently admitted on 9/12 - 05/03/2024 for near syncope.  Was treated for sepsis in the setting of acute bronchitis.  This admission, she was also found to have paroxysmal atrial fibrillation with rapid ventricular rates, as well as a newly reduced EF of 45% and elevated troponin at the peak of 1,833.  Her myocardial injury was attributed to demand ischemia.  Patient has a history of paroxysmal atrial fibrillation that was diagnosed sometime in 2022.  Her symptoms include palpitations. She last endorsed symptoms during her last hospitalization in September. Her CHADS2 Vascor is 7 and she also has apical variant hypertrophic cardiomyopathy.  Her atrial fibrillation has mostly self terminated without antiarrhythmic strategies.  She is on Eliquis  for stroke reduction and carvedilol  25 mg twice daily for rate control.  Recently, TTE in 04/27/2024 showed an EF of 45 to 50% (recently normal in 2023) with a severely large left atrium moderate to severe MR.  She also has  an ascending aortic aneurysm measuring at 4.7 cm.   Past Medical History:  Diagnosis Date   Bicipital tenosynovitis    Cerebral artery occlusion with cerebral infarction (HCC) 12/05/2007   Qualifier: Diagnosis of   By: Loretha MD, [place]       Coronary artery disease 05/2007   [id number]    Fall 02/13/2018   Family history of ischemic heart disease    Gout    Hammer toe    Hip, thigh, leg, and ankle, insect bite, nonvenomous, without mention of infection(916.4)    Hyperlipidemia    Hypertension    Osteoporosis 02/07/2022   Other abnormality of red blood cells    Rotator cuff syndrome of left shoulder    Stroke [facility])     Past Surgical History:  Procedure Laterality Date   CARDIAC CATHETERIZATION     COLONOSCOPY WITH PROPOFOL  N/A 01/30/2020   Procedure: COLONOSCOPY WITH PROPOFOL ;  Surgeon: Rollin Dover, MD;  Location: [facility];  Service: Endoscopy;  Laterality: N/A;   CORONARY ARTERY BYPASS GRAFT     triple   CORONARY ARTERY BYPASS GRAFT  2008   POLYPECTOMY  01/30/2020   Procedure: POLYPECTOMY;  Surgeon: Rollin Dover, MD;  Location: [facility];  Service: Endoscopy;;   TUBAL LIGATION       Home Medications:  Prior to Admission medications   Medication Sig Start Date End Date Taking? Authorizing Provider  albuterol  (VENTOLIN  HFA) 108 (90 Base) MCG/ACT inhaler INHALE 2 PUFFS BY MOUTH INTO THE LUNGS EVERY 6 HOURS AS NEEDED FOR WHEEZING OR SHORTNESS OF BREATH 07/31/23   Shelah Lamar RAMAN, MD  alendronate  (FOSAMAX ) 70 MG tablet TAKE 1 TABLET BY MOUTH EVERY 7 DAYS. Take WITH A full GLASS OF WATER ON EMPTY stomach Patient taking differently: Take 70 mg by mouth every Monday. 03/24/24   Georgina Speaks, FNP  ANORO ELLIPTA  62.5-25 MCG/ACT AEPB INHALE 1 PUFF BY MOUTH INTO THE LUNGS ONCE DAILY. RINSE MOUTH AFTER USE 12/18/23   Georgina Speaks, FNP  benzonatate  (TESSALON  PERLES) 100 MG capsule Take 1 capsule (100 mg total) by mouth 3 (three) times daily as needed. 04/02/24 04/02/25  Petrina Pries, NP  calcium -vitamin D  (OSCAL WITH D) 500-5 MG-MCG tablet Take 1 tablet by mouth 2 (two) times daily with a meal. 05/03/24   Elgergawy, Brayton RAMAN, MD  carvedilol  (COREG ) 25 MG tablet Take 1 tablet (25 mg total) by mouth 2 (two) times daily with a meal. 05/03/24   Elgergawy, Brayton RAMAN, MD  docusate sodium  (COLACE) 100 MG capsule Take 2 capsules (200 mg total) by mouth 2 (two) times daily. 05/03/24   Elgergawy, Brayton RAMAN, MD  ELIQUIS  5 MG TABS tablet TAKE 1 TABLET BY MOUTH 2 TIMES DAILY 04/17/24   Moore, [name], FNP  Ferric Maltol  (ACCRUFER ) 30 MG CAPS Take 1 capsule (30 mg total) by mouth daily. 04/21/24   Petrina Pries, NP  fluticasone  (FLONASE ) 50 MCG/ACT nasal spray SPRAY 2 SPRAYS into EACH nostril DAILY Patient taking differently: Place 2 sprays into both nostrils daily as needed for allergies. 09/27/22   Moore, [name], FNP  hydrocortisone cream 1 % Apply 1 application topically daily as needed for itching.    [provider]  mirtazapine  (REMERON ) 15 MG tablet TAKE 1 TABLET BY MOUTH AT BEDTIME 03/24/24   Moore, [name], FNP  pantoprazole  (PROTONIX ) 40 MG tablet TAKE 1 TABLET BY MOUTH EVERY DAY 03/24/24   Georgina Speaks, FNP  polyethylene glycol (MIRALAX  / GLYCOLAX ) 17 g packet Take 17 g by mouth daily. Hold for diarrhea 05/03/24   Elgergawy, Brayton RAMAN, MD  sacubitril -valsartan  (ENTRESTO ) 24-26 MG Take 1 tablet by mouth 2 (two) times daily. 05/03/24   Elgergawy, Brayton RAMAN, MD  Vitamin D , Ergocalciferol , (DRISDOL ) 1.25 MG (50000 UNIT) CAPS capsule TAKE 1 CAPSULE BY MOUTH TWICE A WEEK Patient taking differently: Take 50,000 Units by mouth See admin instructions. Take 1 capsule (50,000u) by mouth twice a week on Monday and Thursday. 03/24/24   Georgina Speaks, FNP    Scheduled Meds:  Continuous Infusions:  sodium chloride      PRN Meds:   Allergies:    Allergies  Allergen Reactions   Catapres -Tts [Clonidine ] Other (See Comments)    Reaction caused by clonidine  patch but not a direct result of the  medication Patch adhesive caused skin tears resulting in scarring   Aldactone  [Spironolactone ] Rash    Social History:   Social History   Socioeconomic History   Marital status: Single    Spouse name: Not on file   Number of children: 2   Years of education: Not on file   Highest education level: 12th grade  Occupational History   Occupation: retired  Tobacco Use   Smoking status: Former    Current packs/day: 0.00    Average packs/day: 2.0 packs/day for 35.0 years (70.0 ttl pk-yrs)    Types: Cigarettes    Start date: 08/11/1984    Quit date: 08/12/2019    Years since quitting: 4.7   Smokeless tobacco: Former    Quit date: 08/09/2008   Tobacco comments:    Stopped smoking in May 2022 [facility]   Vaping  Use   Vaping status: Never Used  Substance and Sexual Activity   Alcohol use: Not Currently    Comment: ocassionally wine   Drug use: Not Currently    Types: Marijuana    Comment: history of cocaine use   Sexual activity: Not on file  Other Topics Concern   Not on file  Social History Narrative   Patient is right-handed. She lives alone in a one level home, 2 steps to enter. Her family is very active in her care.    Social Drivers of Corporate investment banker Strain: Low Risk  (09/21/2023)   Overall Financial Resource Strain (CARDIA)    Difficulty of Paying Living Expenses: Not very hard  Food Insecurity: No Food Insecurity (04/26/2024)   Hunger Vital Sign    Worried About Running Out of Food in the Last Year: Never true    Ran Out of Food in the Last Year: Never true  Transportation Needs: No Transportation Needs (04/26/2024)   PRAPARE - Administrator, Civil Service (Medical): No    Lack of Transportation (Non-Medical): No  Physical Activity: Unknown (09/21/2023)   Exercise Vital Sign    Days of Exercise per Week: 0 days    Minutes of Exercise per Session: Not on file  Stress: No Stress Concern Present (09/21/2023)   [employer] of Occupational Health -  Occupational Stress Questionnaire    Feeling of Stress : Only a little  Social Connections: Socially Isolated (04/26/2024)   Social Connection and Isolation Panel    Frequency of Communication with Friends and Family: More than three times a week    Frequency of Social Gatherings with Friends and Family: Once a week    Attends Religious Services: Never    Database administrator or Organizations: No    Attends Banker Meetings: Never    Marital Status: Never married  Intimate Partner Violence: Not At Risk (04/26/2024)   Humiliation, Afraid, Rape, and Kick questionnaire    Fear of Current or Ex-Partner: No    Emotionally Abused: No    Physically Abused: No    Sexually Abused: No    Family History:    Family History  Problem Relation Age of Onset   Hypertension Mother    Heart attack Father    Diabetes Father    Kidney failure Father    Anxiety disorder Sister    Sarcoidosis Sister      ROS:  Please see the history of present illness.   All other ROS reviewed and negative.     Physical Exam/Data: Vitals:   05/27/24 2042 05/27/24 2045 05/27/24 2145 05/27/24 2214  BP:  113/86 (!) 119/94 118/88  Pulse:   (!) 118 (!) 119  Resp:  14  16  Temp: 98.2 F (36.8 C)     TempSrc: Oral     SpO2:    100%   No intake or output data in the 24 hours ending 05/27/24 2230    04/25/2024    4:25 PM 04/02/2024    2:59 PM 10/09/2023    3:39 PM  Last 3 Weights  Weight (lbs) 125 lb 130 lb 139 lb 3.2 oz  Weight (kg) 56.7 kg 58.968 kg 63.141 kg     There is no height or weight on file to calculate BMI.  General:  Well nourished, well developed, in no acute distress HEENT: normal Neck: no JVD Vascular: No carotid bruits; Distal pulses 2+ bilaterally Cardiac:  irregular rate  Lungs:  clear to auscultation bilaterally, no wheezing, rhonchi or rales  Abd: soft, nontender, no hepatomegaly  Ext: no edema Musculoskeletal:  No deformities, BUE and BLE strength normal and equal Skin:  warm and dry  Neuro:  CNs 2-12 intact, no focal abnormalities noted Psych:  Normal affect   EKG:  The EKG was personally reviewed and demonstrates:  atrial fibrillation with RVR Telemetry:  Telemetry was personally reviewed and demonstrates:  AF with RVR  Relevant CV Studies: Reviewed   Laboratory Data: High Sensitivity Troponin:   Recent Labs  Lab 05/27/24 2045  TROPONINIHS 354*     Chemistry Recent Labs  Lab 05/27/24 2045  NA 135  K 3.4*  CL 104  CO2 19*  GLUCOSE 108*  BUN 34*  CREATININE 1.54*  CALCIUM  9.6  MG 1.8  GFRNONAA 37*  ANIONGAP 12    No results for input(s): PROT, ALBUMIN, AST, ALT, ALKPHOS, BILITOT in the last 168 hours. Lipids No results for input(s): CHOL, TRIG, HDL, LABVLDL, LDLCALC, CHOLHDL in the last 168 hours.  Hematology Recent Labs  Lab 05/27/24 2045  WBC 12.1*  RBC 4.65  HGB 10.4*  HCT 33.8*  MCV 72.7*  MCH 22.4*  MCHC 30.8  RDW 16.3*  PLT 172   Thyroid No results for input(s): TSH, FREET4 in the last 168 hours.  BNPNo results for input(s): BNP, PROBNP in the last 168 hours.  DDimer No results for input(s): DDIMER in the last 168 hours.  Radiology/Studies:  DG Chest 2 View Result Date: 05/27/2024 CLINICAL DATA:  Chest pain, shortness of breath EXAM: CHEST - 2 VIEW COMPARISON:  04/25/2024 FINDINGS: Prior CABG. Cardiomegaly with vascular congestion. No overt edema. No confluent opacities or effusions. No acute bony abnormality. IMPRESSION: Cardiomegaly, vascular congestion. Electronically Signed   By: Franky Crease M.D.   On: 05/27/2024 21:15     Assessment and Plan:  Kimberly [name] is a 68 y.o. female with a hx of paroxysmal atrial fibrillation, coronary artery disease s/p CABG in 2008, apical variant HCM, congestive heart failure, CVA in 2019, HTN, hyperlipidemia, COPD and tobacco use who is being seen 05/27/2024 for the evaluation of elevated troponin at the request of the emergency  department.  Patient presents with acute on chronic low back pain, found to be in atrial fibrillation with rapid ventricular rates.  She currently is not endorsing cardiac symptoms at this time.  She is euvolemic.  Suspect she has acute myocardial injury in the setting of her AF.  She was hypokalemic on admission, which could have precipitated her AF, where she also has risk factors for progression of AF which include severely dilated left atrium, hypertrophic cardiomyopathy and severe MR.  She also looks a bit dry and has an AKI. Unclear how long she's been in AF and if her LV dysfunction is related. For now, would continue rate control strategies and electrolyte repletion.  She has not missed a dose of Eliquis  in at least a month, therefore she can pursue electrical cardioversion without TEE if necessary.  Given the above risk factors, may be difficult to keep her out of atrial fibrillation.  Will need to discuss antiarrhythmic strategies, which can be done in the outpatient setting.  Ideally, would want to quantify her AF with a Holter monitor.  Recommendations  Gentle volume repletion Replete electrolytes (goal K>4, Mg>2) Restart coreg  25mg  BID Continue apixaban  5mg  BID IV metoprolol  5mg  PRN for HR persistently >120 Obtain orthostatic vitals when able Monitor kidney function daily IV  amiodarone load if persistent symptomatic RVR   Risk Assessment/Risk Scores:         CHA2DS2-VASc Score = 7   This indicates a 11.2% annual risk of stroke. The patient's score is based upon: CHF History: 1 HTN History: 1 Diabetes History: 0 Stroke History: 2 Vascular Disease History: 1 Age Score: 1 Gender Score: 1        For questions or updates, please contact  [facility] Please consult [email or web address] for contact info under      Signed, Carrol Hougland A Aliyyah Riese, MD  05/27/2024 10:30 PM

## 2024-05-27 NOTE — Assessment & Plan Note (Signed)
Appreciate cardiology consult °

## 2024-05-27 NOTE — ED Triage Notes (Signed)
 Pt bib [facility] coming from home. Pt recently discharged from nursing facility to home and c/o back pain, chest pain, and shortness of breath. Pt also reports urinary frequency. Pt has chronic back pain but states worse than usual as of this morning. On EMS arrival, pt afib RVR. GCS 15.  EMS: 115/82 130 HR 100% RA 138 cbg 20 r hand

## 2024-05-27 NOTE — ED Notes (Signed)
(  Sister) would like to be contacted  Montie pagge  [phone]- [phone]

## 2024-05-27 NOTE — Subjective & Objective (Signed)
 Patient recently came home from rehab found to have palpations Her hr was up to 130 EF reduced down to 40% Cardiology was consulted  Patient received IV fluids and iv metoprolol  but HR still up  Had very similar admission in September at that time she was diagnosed with possible sepsis and received antibiotics was noted to have elevated lactic acid elevated troponin as well which felt to be secondary to demand EF was mildly reduced with global hypokinesis She has severe mitral regurgitation and the plan was for her to undergo repeat echogram as an outpatient  Also during her last admission she had T8 compression fracture and rhabdomyolysis requiring TLSO brace felt to be nonsurgical  Patient is pleasant but but has not been completely forthcoming with history

## 2024-05-28 ENCOUNTER — Observation Stay (HOSPITAL_COMMUNITY)

## 2024-05-28 DIAGNOSIS — D509 Iron deficiency anemia, unspecified: Secondary | ICD-10-CM | POA: Diagnosis present

## 2024-05-28 DIAGNOSIS — I130 Hypertensive heart and chronic kidney disease with heart failure and stage 1 through stage 4 chronic kidney disease, or unspecified chronic kidney disease: Secondary | ICD-10-CM | POA: Diagnosis not present

## 2024-05-28 DIAGNOSIS — E8721 Acute metabolic acidosis: Secondary | ICD-10-CM | POA: Diagnosis not present

## 2024-05-28 DIAGNOSIS — I5A Non-ischemic myocardial injury (non-traumatic): Secondary | ICD-10-CM | POA: Diagnosis present

## 2024-05-28 DIAGNOSIS — J449 Chronic obstructive pulmonary disease, unspecified: Secondary | ICD-10-CM | POA: Diagnosis not present

## 2024-05-28 DIAGNOSIS — R9431 Abnormal electrocardiogram [ECG] [EKG]: Secondary | ICD-10-CM | POA: Diagnosis present

## 2024-05-28 DIAGNOSIS — I712 Thoracic aortic aneurysm, without rupture, unspecified: Secondary | ICD-10-CM | POA: Diagnosis present

## 2024-05-28 DIAGNOSIS — I5043 Acute on chronic combined systolic (congestive) and diastolic (congestive) heart failure: Secondary | ICD-10-CM | POA: Diagnosis not present

## 2024-05-28 DIAGNOSIS — R0602 Shortness of breath: Secondary | ICD-10-CM | POA: Diagnosis not present

## 2024-05-28 DIAGNOSIS — M549 Dorsalgia, unspecified: Secondary | ICD-10-CM | POA: Diagnosis not present

## 2024-05-28 DIAGNOSIS — I2489 Other forms of acute ischemic heart disease: Secondary | ICD-10-CM | POA: Diagnosis not present

## 2024-05-28 DIAGNOSIS — I4892 Unspecified atrial flutter: Secondary | ICD-10-CM | POA: Diagnosis not present

## 2024-05-28 DIAGNOSIS — I7121 Aneurysm of the ascending aorta, without rupture: Secondary | ICD-10-CM | POA: Diagnosis not present

## 2024-05-28 DIAGNOSIS — L89153 Pressure ulcer of sacral region, stage 3: Secondary | ICD-10-CM | POA: Diagnosis not present

## 2024-05-28 DIAGNOSIS — I421 Obstructive hypertrophic cardiomyopathy: Secondary | ICD-10-CM | POA: Diagnosis present

## 2024-05-28 DIAGNOSIS — I272 Pulmonary hypertension, unspecified: Secondary | ICD-10-CM | POA: Diagnosis not present

## 2024-05-28 DIAGNOSIS — Z7901 Long term (current) use of anticoagulants: Secondary | ICD-10-CM | POA: Diagnosis not present

## 2024-05-28 DIAGNOSIS — I7143 Infrarenal abdominal aortic aneurysm, without rupture: Secondary | ICD-10-CM | POA: Diagnosis not present

## 2024-05-28 DIAGNOSIS — I5022 Chronic systolic (congestive) heart failure: Secondary | ICD-10-CM | POA: Diagnosis not present

## 2024-05-28 DIAGNOSIS — R109 Unspecified abdominal pain: Secondary | ICD-10-CM | POA: Diagnosis not present

## 2024-05-28 DIAGNOSIS — I422 Other hypertrophic cardiomyopathy: Secondary | ICD-10-CM | POA: Diagnosis not present

## 2024-05-28 DIAGNOSIS — I6523 Occlusion and stenosis of bilateral carotid arteries: Secondary | ICD-10-CM | POA: Diagnosis present

## 2024-05-28 DIAGNOSIS — I2510 Atherosclerotic heart disease of native coronary artery without angina pectoris: Secondary | ICD-10-CM | POA: Diagnosis not present

## 2024-05-28 DIAGNOSIS — G8929 Other chronic pain: Secondary | ICD-10-CM | POA: Diagnosis present

## 2024-05-28 DIAGNOSIS — R7989 Other specified abnormal findings of blood chemistry: Secondary | ICD-10-CM | POA: Diagnosis not present

## 2024-05-28 DIAGNOSIS — I4891 Unspecified atrial fibrillation: Secondary | ICD-10-CM | POA: Diagnosis not present

## 2024-05-28 DIAGNOSIS — D649 Anemia, unspecified: Secondary | ICD-10-CM | POA: Diagnosis present

## 2024-05-28 DIAGNOSIS — E785 Hyperlipidemia, unspecified: Secondary | ICD-10-CM | POA: Diagnosis present

## 2024-05-28 DIAGNOSIS — I10 Essential (primary) hypertension: Secondary | ICD-10-CM | POA: Diagnosis not present

## 2024-05-28 DIAGNOSIS — I69354 Hemiplegia and hemiparesis following cerebral infarction affecting left non-dominant side: Secondary | ICD-10-CM | POA: Diagnosis not present

## 2024-05-28 DIAGNOSIS — I340 Nonrheumatic mitral (valve) insufficiency: Secondary | ICD-10-CM | POA: Diagnosis not present

## 2024-05-28 DIAGNOSIS — Z87891 Personal history of nicotine dependence: Secondary | ICD-10-CM | POA: Diagnosis not present

## 2024-05-28 DIAGNOSIS — I4811 Longstanding persistent atrial fibrillation: Secondary | ICD-10-CM | POA: Diagnosis not present

## 2024-05-28 DIAGNOSIS — N179 Acute kidney failure, unspecified: Secondary | ICD-10-CM | POA: Diagnosis not present

## 2024-05-28 DIAGNOSIS — E876 Hypokalemia: Secondary | ICD-10-CM | POA: Diagnosis present

## 2024-05-28 DIAGNOSIS — R079 Chest pain, unspecified: Secondary | ICD-10-CM | POA: Diagnosis not present

## 2024-05-28 DIAGNOSIS — N1831 Chronic kidney disease, stage 3a: Secondary | ICD-10-CM | POA: Diagnosis not present

## 2024-05-28 DIAGNOSIS — R7303 Prediabetes: Secondary | ICD-10-CM | POA: Diagnosis present

## 2024-05-28 LAB — CBC
HCT: 28.1 % — ABNORMAL LOW (ref 36.0–46.0)
Hemoglobin: 9 g/dL — ABNORMAL LOW (ref 12.0–15.0)
MCH: 23.1 pg — ABNORMAL LOW (ref 26.0–34.0)
MCHC: 32 g/dL (ref 30.0–36.0)
MCV: 72.1 fL — ABNORMAL LOW (ref 80.0–100.0)
Platelets: 154 K/uL (ref 150–400)
RBC: 3.9 MIL/uL (ref 3.87–5.11)
RDW: 16.7 % — ABNORMAL HIGH (ref 11.5–15.5)
WBC: 8.7 K/uL (ref 4.0–10.5)
nRBC: 0 % (ref 0.0–0.2)

## 2024-05-28 LAB — HEPATIC FUNCTION PANEL
ALT: 30 U/L (ref 0–44)
AST: 36 U/L (ref 15–41)
Albumin: 3.4 g/dL — ABNORMAL LOW (ref 3.5–5.0)
Alkaline Phosphatase: 46 U/L (ref 38–126)
Bilirubin, Direct: 0.2 mg/dL (ref 0.0–0.2)
Indirect Bilirubin: 0.5 mg/dL (ref 0.3–0.9)
Total Bilirubin: 0.7 mg/dL (ref 0.0–1.2)
Total Protein: 5.9 g/dL — ABNORMAL LOW (ref 6.5–8.1)

## 2024-05-28 LAB — COMPREHENSIVE METABOLIC PANEL WITH GFR
ALT: 28 U/L (ref 0–44)
AST: 37 U/L (ref 15–41)
Albumin: 3.2 g/dL — ABNORMAL LOW (ref 3.5–5.0)
Alkaline Phosphatase: 47 U/L (ref 38–126)
Anion gap: 10 (ref 5–15)
BUN: 33 mg/dL — ABNORMAL HIGH (ref 8–23)
CO2: 19 mmol/L — ABNORMAL LOW (ref 22–32)
Calcium: 8.6 mg/dL — ABNORMAL LOW (ref 8.9–10.3)
Chloride: 107 mmol/L (ref 98–111)
Creatinine, Ser: 1.43 mg/dL — ABNORMAL HIGH (ref 0.44–1.00)
GFR, Estimated: 40 mL/min — ABNORMAL LOW (ref >60)
Glucose, Bld: 79 mg/dL (ref 70–99)
Potassium: 3.9 mmol/L (ref 3.5–5.1)
Sodium: 136 mmol/L (ref 135–145)
Total Bilirubin: 0.8 mg/dL (ref 0.0–1.2)
Total Protein: 5.3 g/dL — ABNORMAL LOW (ref 6.5–8.1)

## 2024-05-28 LAB — TROPONIN I (HIGH SENSITIVITY)
Troponin I (High Sensitivity): 437 ng/L (ref <18)
Troponin I (High Sensitivity): 501 ng/L (ref <18)
Troponin I (High Sensitivity): 616 ng/L (ref <18)
Troponin I (High Sensitivity): 478 ng/L (ref <18)
Troponin I (High Sensitivity): 450 ng/L (ref <18)

## 2024-05-28 LAB — BRAIN NATRIURETIC PEPTIDE: B Natriuretic Peptide: 1244.1 pg/mL — ABNORMAL HIGH (ref 0.0–100.0)

## 2024-05-28 LAB — GLUCOSE, CAPILLARY: Glucose-Capillary: 130 mg/dL — ABNORMAL HIGH (ref 70–99)

## 2024-05-28 LAB — D-DIMER, QUANTITATIVE: D-Dimer, Quant: 0.55 ug{FEU}/mL — ABNORMAL HIGH (ref 0.00–0.50)

## 2024-05-28 LAB — PHOSPHORUS: Phosphorus: 3.4 mg/dL (ref 2.5–4.6)

## 2024-05-28 LAB — I-STAT CG4 LACTIC ACID, ED: Lactic Acid, Venous: 1.5 mmol/L (ref 0.5–1.9)

## 2024-05-28 LAB — MAGNESIUM: Magnesium: 2.3 mg/dL (ref 1.7–2.4)

## 2024-05-28 LAB — CK: Total CK: 580 U/L — ABNORMAL HIGH (ref 38–234)

## 2024-05-28 MED ORDER — PANTOPRAZOLE SODIUM 40 MG PO TBEC
40.0000 mg | DELAYED_RELEASE_TABLET | Freq: Every day | ORAL | Status: DC
  Administered 2024-05-28 – 2024-06-04 (×8): 40 mg via ORAL
  Filled 2024-05-28 (×8): qty 1

## 2024-05-28 MED ORDER — SODIUM CHLORIDE 0.9 % IV SOLN: INTRAVENOUS | Status: AC

## 2024-05-28 MED ORDER — BISACODYL 10 MG RE SUPP: 10.0000 mg | Freq: Once | RECTAL | Status: DC

## 2024-05-28 MED ORDER — CARVEDILOL 25 MG PO TABS
25.0000 mg | ORAL_TABLET | Freq: Two times a day (BID) | ORAL | Status: DC
  Administered 2024-05-28 – 2024-05-29 (×3): 25 mg via ORAL
  Filled 2024-05-28 (×2): qty 1
  Filled 2024-05-28: qty 2

## 2024-05-28 MED ORDER — APIXABAN 5 MG PO TABS
5.0000 mg | ORAL_TABLET | Freq: Two times a day (BID) | ORAL | Status: DC
  Administered 2024-05-28 – 2024-06-04 (×16): 5 mg via ORAL
  Filled 2024-05-28 (×16): qty 1

## 2024-05-28 MED ORDER — AMIODARONE LOAD VIA INFUSION
150.0000 mg | Freq: Once | INTRAVENOUS | Status: AC
  Administered 2024-05-28: 150 mg via INTRAVENOUS
  Filled 2024-05-28: qty 83.34

## 2024-05-28 MED ORDER — ACETAMINOPHEN 650 MG RE SUPP: 650.0000 mg | Freq: Four times a day (QID) | RECTAL | Status: DC | PRN

## 2024-05-28 MED ORDER — DOCUSATE SODIUM 100 MG PO CAPS: 100.0000 mg | ORAL_CAPSULE | Freq: Two times a day (BID) | ORAL | Status: DC

## 2024-05-28 MED ORDER — FENTANYL CITRATE (PF) 50 MCG/ML IJ SOSY
12.5000 ug | PREFILLED_SYRINGE | INTRAMUSCULAR | Status: DC | PRN
  Administered 2024-05-28: 12.5 ug via INTRAVENOUS
  Administered 2024-05-30: 50 ug via INTRAVENOUS
  Filled 2024-05-28 (×2): qty 1

## 2024-05-28 MED ORDER — HYDROCODONE-ACETAMINOPHEN 5-325 MG PO TABS
1.0000 | ORAL_TABLET | ORAL | Status: DC | PRN
  Administered 2024-05-28 – 2024-05-29 (×2): 2 via ORAL
  Administered 2024-05-29: 1 via ORAL
  Administered 2024-05-29 – 2024-06-04 (×15): 2 via ORAL
  Filled 2024-05-28 (×18): qty 2

## 2024-05-28 MED ORDER — AMIODARONE HCL IN DEXTROSE 360-4.14 MG/200ML-% IV SOLN
30.0000 mg/h | INTRAVENOUS | Status: DC
  Administered 2024-05-28 – 2024-05-29 (×2): 30 mg/h via INTRAVENOUS
  Filled 2024-05-28 (×3): qty 200

## 2024-05-28 MED ORDER — SODIUM CHLORIDE 0.9 % IV BOLUS
250.0000 mL | Freq: Once | INTRAVENOUS | Status: AC
  Administered 2024-05-28: 250 mL via INTRAVENOUS

## 2024-05-28 MED ORDER — IOHEXOL 350 MG/ML SOLN
75.0000 mL | Freq: Once | INTRAVENOUS | Status: AC | PRN
  Administered 2024-05-28: 75 mL via INTRAVENOUS

## 2024-05-28 MED ORDER — AMIODARONE HCL IN DEXTROSE 360-4.14 MG/200ML-% IV SOLN
60.0000 mg/h | INTRAVENOUS | Status: AC
  Administered 2024-05-28 (×2): 60 mg/h via INTRAVENOUS
  Filled 2024-05-28 (×2): qty 200

## 2024-05-28 MED ORDER — DOCUSATE SODIUM 100 MG PO CAPS
100.0000 mg | ORAL_CAPSULE | Freq: Two times a day (BID) | ORAL | Status: DC
  Administered 2024-05-28 – 2024-06-04 (×15): 100 mg via ORAL
  Filled 2024-05-28 (×15): qty 1

## 2024-05-28 MED ORDER — POLYETHYLENE GLYCOL 3350 17 G PO PACK
17.0000 g | PACK | Freq: Every day | ORAL | Status: DC | PRN
Start: 2024-05-28 — End: 2024-06-04
  Administered 2024-05-30: 17 g via ORAL
  Filled 2024-05-28 (×2): qty 1

## 2024-05-28 MED ORDER — MIRTAZAPINE 15 MG PO TABS
15.0000 mg | ORAL_TABLET | Freq: Every day | ORAL | Status: DC
  Administered 2024-05-28 – 2024-06-03 (×8): 15 mg via ORAL
  Filled 2024-05-28 (×8): qty 1

## 2024-05-28 MED ORDER — ACETAMINOPHEN 325 MG PO TABS
650.0000 mg | ORAL_TABLET | Freq: Four times a day (QID) | ORAL | Status: DC | PRN
  Administered 2024-05-28 – 2024-06-01 (×2): 650 mg via ORAL
  Filled 2024-05-28 (×2): qty 2

## 2024-05-28 NOTE — ED Notes (Signed)
 Patient was changed and used minimal assistance.

## 2024-05-28 NOTE — ED Notes (Signed)
 PT and OT at bedside

## 2024-05-28 NOTE — Consult Note (Signed)
 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   POC discussed with bedside nurse. WOC team will not follow. Re-consult if further needs arise.   Thank you,    Powell Bar MSN, RN-BC, [employer]

## 2024-05-28 NOTE — ED Notes (Signed)
 Pt repositioned. Covers readjusted. Brief check and is clean and dry. Pt has no complaints at this time.

## 2024-05-28 NOTE — Evaluation (Signed)
 Physical Therapy Evaluation Patient Details Name: Kimberly [name] MRN: [id number] DOB: 1955/12/30 Today's Date: 05/28/2024  History of Present Illness  Pt is a 68 y.o. female presented to [facility] ED 05/27/24 for back and chest pain. Found to be in afib with RVR. Recent admission and snf d/c for NSTEMI & T8 compression fx. 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   Clinical Impression  Pt admitted with above diagnosis. Pt was recently d/c'd from [facility] after receiving therapy services. She primarily utilizes the manual w/c for functional mobility. Pt requires assistance to ambulate using RW. Pt currently with functional limitations due to the deficits listed below (see PT Problem List). She required max-totalA x2 for bed mobility. Pt is currently limited by global pain, impaired balance, generalized weakness, and decreased activity tolerance. Pt will benefit from acute skilled PT to increase her independence and safety with mobility to allow discharge. Given her CLOF, high fall risk, and limited support available recommend continued inpatient follow up therapy, <3 hours/day.     If plan is discharge home, recommend the following: Two people to help with walking and/or transfers;Two people to help with bathing/dressing/bathroom;Assistance with cooking/housework;Assist for transportation;Help with stairs or ramp for entrance;Supervision due to cognitive status;Direct supervision/assist for medications management   Can travel by private vehicle   No    Equipment Recommendations Hospital bed;Hoyer lift  Recommendations for Other Services       Functional Status Assessment Patient has had a recent decline in their functional status and demonstrates the ability to make significant improvements in function in a  reasonable and predictable amount of time.     Precautions / Restrictions Precautions Precautions: Fall Recall of Precautions/Restrictions: Impaired Precaution/Restrictions Comments: No formal orders for back precautions. Followed for pt comfort and out of caution d/t pt's recent T8 compression fx. She was given a TLSO brace last admission, but it was not present. Restrictions Weight Bearing Restrictions Per Provider Order: No      Mobility  Bed Mobility Overal bed mobility: Needs Assistance Bed Mobility: Rolling, Sidelying to Sit, Sit to Sidelying Rolling: Max assist, +2 for physical assistance, +2 for safety/equipment Sidelying to sit: Max assist, +2 for physical assistance, +2 for safety/equipment     Sit to sidelying: Total assist, +2 for physical assistance, +2 for safety/equipment General bed mobility comments: Educated pt on log roll technique. Cues for sequencing. OT managed BLEs and PT managed trunk. She initially participated but became overwhelmed by pain and was dependent on therapist.    Transfers Overall transfer level: Needs assistance                 General transfer comment: Deferred d/t significant worsening of pain and for pt safety. She demonstrated posterior lean with inability to correct despite multi-modal cueing and hips was started to slide off bed.    Ambulation/Gait                  Stairs            Wheelchair Mobility     Tilt Bed    Modified Rankin (Stroke Patients Only)       Balance Overall balance assessment: Needs assistance Sitting-balance support: Bilateral upper extremity supported, Feet supported Sitting balance-Leahy Scale: Poor Sitting balance - Comments: Pt was unable to maintain static sitting balance. PT/OT facilitated return to bed with  totalA d/t pt's significant posterior lean with inability to correct. Postural control: Posterior lean                                   Pertinent  Vitals/Pain Pain Assessment Pain Assessment: Faces Faces Pain Scale: Hurts whole lot Pain Location: Generalized with any movement. Pain Descriptors / Indicators: Grimacing, Moaning, Crying Pain Intervention(s): Monitored during session, Limited activity within patient's tolerance, Repositioned    Home Living Family/patient expects to be discharged to:: Private residence Living Arrangements: Alone Available Help at Discharge: Personal care attendant;Family;Available PRN/intermittently (Caregiver 7 days/wk for 2 hours a day; Daughter visits every night) Type of Home: Apartment Home Access: Stairs to enter Entrance Stairs-Rails: None Entrance Stairs-Number of Steps: 2   Home Layout: One level Home Equipment: Agricultural consultant (2 wheels);Wheelchair - Lawyer Comments: Pt recieved therapy services from [facility], recently d/c'd home. Pt reports she was back for 1 day.    Prior Function Prior Level of Function : Needs assist       Physical Assist : Mobility (physical);ADLs (physical) Mobility (physical): Gait ADLs (physical): Bathing;Dressing;Toileting;IADLs Mobility Comments: Pt reports she was working on ambulating using RW with therapy. She primarily mobilizes in a w/c. ADLs Comments: Pt reports staff was assisting with basic self-care. Previously, pt states Assist with LB ADLs from daughter; assist with tub transfer; intermittent assist with toilet hygeine; sits in w/c for grooming with Mod I; daughter assists with medications using pill planner; has caregiver who assists with meal prep; daughter assists with dinners and other IADLs, including transportation     Extremity/Trunk Assessment   Upper Extremity Assessment Upper Extremity Assessment: Defer to OT evaluation    Lower Extremity Assessment Lower Extremity Assessment: Generalized weakness;LLE deficits/detail LLE Deficits / Details: Hx of hemiplegia LLE Sensation: decreased proprioception LLE  Coordination: decreased gross motor    Cervical / Trunk Assessment Cervical / Trunk Assessment: Other exceptions Cervical / Trunk Exceptions: Hx of T8 Compression fx  Communication   Communication Communication: Impaired Factors Affecting Communication: Hearing impaired    Cognition Arousal: Alert Behavior During Therapy: Anxious   PT - Cognitive impairments: No family/caregiver present to determine baseline                       PT - Cognition Comments: Pt internally distracted and fixated on pain. Following commands: Impaired Following commands impaired: Follows one step commands inconsistently, Follows one step commands with increased time     Cueing Cueing Techniques: Verbal cues, Gestural cues     General Comments General comments (skin integrity, edema, etc.): VSS on RA    Exercises     Assessment/Plan    PT Assessment Patient needs continued PT services  PT Problem List Pain;Decreased strength;Decreased activity tolerance;Decreased balance;Decreased mobility;Decreased cognition;Decreased knowledge of use of DME;Decreased safety awareness;Decreased knowledge of precautions       PT Treatment Interventions DME instruction;Gait training;Functional mobility training;Therapeutic activities;Therapeutic exercise;Balance training;Patient/family education;Wheelchair mobility training;Cognitive remediation    PT Goals (Current goals can be found in the Care Plan section)  Acute Rehab PT Goals Patient Stated Goal: Have less pain and move better PT Goal Formulation: With patient Time For Goal Achievement: 06/11/24 Potential to Achieve Goals: Fair    Frequency Min 2X/week     Co-evaluation PT/OT/SLP Co-Evaluation/Treatment: Yes Reason for Co-Treatment: For patient/therapist safety;To address functional/ADL transfers PT goals addressed during session: Mobility/safety with mobility;Balance  AM-PAC PT 6 Clicks Mobility  Outcome Measure Help needed  turning from your back to your side while in a flat bed without using bedrails?: Total Help needed moving from lying on your back to sitting on the side of a flat bed without using bedrails?: Total Help needed moving to and from a bed to a chair (including a wheelchair)?: Total Help needed standing up from a chair using your arms (e.g., wheelchair or bedside chair)?: Total Help needed to walk in hospital room?: Total Help needed climbing 3-5 steps with a railing? : Total 6 Click Score: 6    End of Session   Activity Tolerance: Patient limited by pain Patient left: in bed;with call bell/phone within reach Nurse Communication: Mobility status;Need for lift equipment PT Visit Diagnosis: Pain;Muscle weakness (generalized) (M62.81);Difficulty in walking, not elsewhere classified (R26.2);Unsteadiness on feet (R26.81) Pain - Right/Left:  (Bilateral) Pain - part of body: Shoulder;Arm;Hip;Knee;Leg (Back)    Time: 9151-9096 PT Time Calculation (min) (ACUTE ONLY): 15 min   Charges:   PT Evaluation $PT Eval Moderate Complexity: 1 Mod   PT General Charges $$ ACUTE PT VISIT: 1 Visit         Randall SAUNDERS, PT, DPT Acute Rehabilitation Services Office: [phone] Secure Chat Preferred  Delon CHRISTELLA Callander 05/28/2024, 10:13 AM

## 2024-05-28 NOTE — Assessment & Plan Note (Addendum)
 Will rehydrate and obtain electrolytes Hold Entresto 

## 2024-05-28 NOTE — Assessment & Plan Note (Signed)
-  will monitor on tele avoid QT prolonging medications, rehydrate correct electrolytes ? ?

## 2024-05-28 NOTE — Assessment & Plan Note (Signed)
 Appreciate cardiology help with management Continue Eliquis  5 mg daily Attempted to use metoprolol  for rate control Hydrate  Cardiology recommends: Rehydrate maintain potassium above 4 magnesium  above 2 Restart Coreg  25 mg p.o. twice daily Restart Eliquis  IV metoprolol  5 mg as needed heart rate above 120 Would need IV amiodarone load if persistent symptomatic RVR

## 2024-05-28 NOTE — Progress Notes (Signed)
 Attempted to get consent form signed and placed in chart for cardioversion tomorrow however pt states she does not want to sign at this time she wants to talk it over with her sister tonight and has more questions for the MD about the procedure. Night shift RN is aware and will pass along to next shift RN.

## 2024-05-28 NOTE — Evaluation (Signed)
 Occupational Therapy Evaluation Patient Details Name: Kimberly [name] MRN: [id number] DOB: 1956-03-03 Today's Date: 05/28/2024   History of Present Illness   Pt is a 68 y.o. female presented to [facility] ED 05/27/24 for back and chest pain. Found to be in afib with RVR. Recent admission and snf d/c for NSTEMI & T8 compression fx. 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     Clinical Impressions Pt admitted based on above, and was seen based on problem list below. PTA pt was receiving assistance with ADLs and functional transfers. Today pt is requiring set up  to total assist for bed level ADLs. Pt limited by pain, reporting pain everywhere with all movements including raising head of stretcher. Pt unable to tolerate sitting EOB, heavy posterior lean, not following cues to correct, requiring max +2 assist of therapist to return to supine. Given pt's level of assistance needed and lack of 24/7 care available recommending <3 hours of skilled rehab daily. OT will continue to follow acutely to maximize functional independence.     If plan is discharge home, recommend the following:   Two people to help with walking and/or transfers;Two people to help with bathing/dressing/bathroom     Functional Status Assessment   Patient has had a recent decline in their functional status and demonstrates the ability to make significant improvements in function in a reasonable and predictable amount of time.     Equipment Recommendations   Other (comment) (Defer to next venue)      Precautions/Restrictions   Precautions Precautions: Fall Recall of Precautions/Restrictions: Impaired Precaution/Restrictions Comments: No formal orders for back precautions. Followed for pt comfort and out of caution d/t pt's recent T8  compression fx. She was given a TLSO brace last admission, but it was not present. Restrictions Weight Bearing Restrictions Per Provider Order: No     Mobility Bed Mobility Overal bed mobility: Needs Assistance Bed Mobility: Rolling, Sidelying to Sit, Sit to Sidelying Rolling: Max assist, +2 for physical assistance, +2 for safety/equipment Sidelying to sit: Max assist, +2 for physical assistance, +2 for safety/equipment     Sit to sidelying: Total assist, +2 for physical assistance, +2 for safety/equipment General bed mobility comments: Assist for managing BLEs and trunk, pt limited by pain, became unable to follow cues to assist    Transfers Overall transfer level: Needs assistance       General transfer comment: Deferred d/t significant worsening of pain and for pt safety. She demonstrated posterior lean with inability to correct despite multi-modal cueing and hips was started to slide off bed.      Balance Overall balance assessment: Needs assistance Sitting-balance support: Bilateral upper extremity supported, Feet supported Sitting balance-Leahy Scale: Poor Sitting balance - Comments: pt with significant posterior lean, unable to correct with cues, causing therapist to facilitate return to bed Postural control: Posterior lean         ADL either performed or assessed with clinical judgement   ADL Overall ADL's : Needs assistance/impaired Eating/Feeding: Set up;Bed level   Grooming: Set up;Bed level   Upper Body Bathing: Minimal assistance;Bed level   Lower Body Bathing: Maximal assistance;Bed level   Upper Body Dressing : Minimal assistance;Bed level   Lower Body Dressing: Maximal assistance;Bed level       Toileting- Clothing Manipulation and Hygiene: Total assistance;Bed level  General ADL Comments: Pt limited by pain, crying out in pain with all attempts at movement     Vision Baseline Vision/History: 0 No visual deficits Patient Visual  Report: No change from baseline Vision Assessment?: No apparent visual deficits            Pertinent Vitals/Pain Pain Assessment Pain Assessment: Faces Faces Pain Scale: Hurts whole lot Pain Location: Generalized with any movement. Pain Descriptors / Indicators: Grimacing, Moaning, Crying Pain Intervention(s): Limited activity within patient's tolerance     Extremity/Trunk Assessment Upper Extremity Assessment Upper Extremity Assessment: Generalized weakness   Lower Extremity Assessment Lower Extremity Assessment: Defer to PT evaluation   Cervical / Trunk Assessment Cervical / Trunk Assessment: Other exceptions Cervical / Trunk Exceptions: Hx of T8 Compression fx   Communication Communication Communication: Impaired Factors Affecting Communication: Hearing impaired   Cognition Arousal: Alert Behavior During Therapy: Anxious Cognition: No apparent impairments     OT - Cognition Comments: Pain and anxiety limiting ability to follow commands       Following commands: Impaired Following commands impaired: Follows one step commands inconsistently, Follows one step commands with increased time     Cueing  General Comments   Cueing Techniques: Verbal cues;Gestural cues  VSS on RA    Home Living Family/patient expects to be discharged to:: Private residence Living Arrangements: Alone Available Help at Discharge: Personal care attendant;Family;Available PRN/intermittently (PCA 7 days/wk for 2 hours a day; Daughter visits every night) Type of Home: Apartment Home Access: Stairs to enter [employer] of Steps: 2 Entrance Stairs-Rails: None Home Layout: One level     Bathroom Shower/Tub: Chief Strategy Officer: Standard Bathroom Accessibility: Yes How Accessible: Accessible via walker Home Equipment: Rolling Walker (2 wheels);Wheelchair - Sport and exercise psychologist Comments: Pt recieved therapy services from [facility], recently  d/c'd home. Pt reports she was back for 1 day.      Prior Functioning/Environment Prior Level of Function : Needs assist       Physical Assist : Mobility (physical);ADLs (physical) Mobility (physical): Gait ADLs (physical): Bathing;Dressing;Toileting;IADLs Mobility Comments: Pt reports she was working on ambulating using RW with therapy. She primarily mobilizes in a w/c. ADLs Comments: Daughter assists with ADLs, mod I for UB ADLs and grooming at w/c level    OT Problem List: Decreased strength;Decreased range of motion;Decreased activity tolerance;Impaired balance (sitting and/or standing);Decreased safety awareness;Cardiopulmonary status limiting activity   OT Treatment/Interventions: Self-care/ADL training;Therapeutic exercise;Energy conservation;DME and/or AE instruction;Therapeutic activities;Patient/family education;Balance training      OT Goals(Current goals can be found in the care plan section)   Acute Rehab OT Goals Patient Stated Goal: To rest OT Goal Formulation: With patient Time For Goal Achievement: 06/11/24 Potential to Achieve Goals: Good   OT Frequency:  Min 2X/week    Co-evaluation PT/OT/SLP Co-Evaluation/Treatment: Yes Reason for Co-Treatment: For patient/therapist safety;To address functional/ADL transfers   OT goals addressed during session: ADL's and self-care      AM-PAC OT 6 Clicks Daily Activity     Outcome Measure Help from another person eating meals?: None Help from another person taking care of personal grooming?: A Little Help from another person toileting, which includes using toliet, bedpan, or urinal?: Total Help from another person bathing (including washing, rinsing, drying)?: A Lot Help from another person to put on and taking off regular upper body clothing?: A Little Help from another person to put on and taking off regular lower body clothing?: A Lot 6 Click Score: 15  End of Session Nurse Communication: Mobility  status  Activity Tolerance: Patient limited by pain Patient left: in bed;with call bell/phone within reach  OT Visit Diagnosis: Unsteadiness on feet (R26.81);Other abnormalities of gait and mobility (R26.89);Muscle weakness (generalized) (M62.81);Pain Pain - part of body:  (Everywhere)                Time: 9150-9097 OT Time Calculation (min): 13 min Charges:  OT General Charges $OT Visit: 1 Visit OT Evaluation $OT Eval Moderate Complexity: 1 Mod  Adrianne BROCKS, OT  Acute Rehabilitation Services Office [phone] Secure chat preferred   Adrianne GORMAN Savers 05/28/2024, 3:28 PM

## 2024-05-28 NOTE — Assessment & Plan Note (Signed)
 Likely demand related in the setting of elevated heart rate appreciate cardiology consult

## 2024-05-28 NOTE — ED Notes (Addendum)
 CCMD called to add pt to cardiac monitoring. Pt endorses chest pressure 8/10 that radiates to her back, dizziness, shob, and headache.

## 2024-05-28 NOTE — Progress Notes (Addendum)
 Progress Note  Patient Name: Kimberly [name] Date of Encounter: 05/28/2024 [facility] Cardiologist: Lynwood Schilling, MD   Interval Summary   Shared she is having a lot pain all over her body. Reported lightheadedness, dizziness, and shortness of breath periodically. Did not fall this time-> have been laying in bed mostly which has resulted in a bed sore.   Vital Signs Vitals:   05/28/24 0735 05/28/24 0748 05/28/24 0800 05/28/24 0815  BP: (!) 143/111  (!) 143/129 (!) 133/110  Pulse: (!) 105  (!) 118   Resp: 16  (!) 23 (!) 21  Temp:  98 F (36.7 C)    TempSrc:  Oral    SpO2: 99%  93%   Weight:      Height:        Intake/Output Summary (Last 24 hours) at 05/28/2024 0851 Last data filed at 05/28/2024 0751 Gross per 24 hour  Intake 1629.59 ml  Output --  Net 1629.59 ml      05/28/2024   12:22 AM 04/25/2024    4:25 PM 04/02/2024    2:59 PM  Last 3 Weights  Weight (lbs) 125 lb 125 lb 130 lb  Weight (kg) 56.7 kg 56.7 kg 58.968 kg      Telemetry/ECG  AF RVR HR ~120 - Personally Reviewed  Physical Exam  GEN: No acute distress.   Cardiac: Irregularly, irregular, no murmurs, rubs, or gallops.  Respiratory: Clear to auscultation bilaterally. GI: Soft, nontender, non-distended  MS: No edema though tenderness to palpation of the lower extremities  Assessment & Plan  Kimberly [name] is a 68 y.o. female with a hx of paroxysmal atrial fibrillation on eliquis , coronary artery disease s/p CABG in 2008, apical variant HCM, congestive heart failure, CVA in 2019, HTN, hyperlipidemia, and tobacco use who presented to the ED on 10/14 for chest/back pain, urinary frequency and shortness of breath. BP:121/100 and in AF RVR  [HR 126]. She was given pain medication, IV fluids, IV lopressor  x2 and oral lopressor , and electrolyte repletion. Troponin elevation noted. Cardiology consulted.   Atrial Fibrillation with RVR Patient reports she has not missed any home eliquis   doses. Patient has not undergone DCCV for her AF, appears on chart review there were discussions but patient did not go through with the procedure.   Potentially pursue DCCV tomorrow pending if we pursue cardiac catheterization [see below]. Given the chronicity of her AF unsure if she will remain in sinus rhythm after DCCV, making the addition of AAD helpful. Will avoid AAD class 1c with CAD. She does have a history of prolonged Qtc which had precluded her candidacy for sotalol or Tikosyn previously. As she is still in symptomatic AF RVR will start amiodarone and will closely watch Qtc. Will make her NPO at midnight.    Continue eliquis  5 mg BID, if AKI persists may need to reduce dose given body weight and renal function Continue coreg  25mg  BID Start IV amiodarone  Keep K > 4 and Mag >2 Both repleted on admission  Elevated Troponin 04/2024 admitted rhabdo [CK 915] after mechanical fall, noted to have troponin elevation to ~2000 which was though to be due to demand ischemia, unable to pursue ischemic evaluation at that time with her degree of AKI  Hs troponin this admission 354 -> 407-> 437-> 501-> 616 Hard to discern about chest pain as patient reports pain all over. She did have tenderness to palpation of the chest wall making coronary chest pain less likely.  ECG showing  no acute signs of ischemia Will continue to monitor symptoms, while I suspect troponin elevation is due to demand ischemia in the setting of a patient with known CAD and currently in AF RVR further evaluation of her coronary anatomy may be beneficial [see below]  CAD s/p CABG in 2008 Myoview  in 2021 that showed infarct in the basal inferolateral, basal anterolateral, and mid inferolateral, mid anterolateral, and apical lateral location. No further work-up was pursued as it was not deemed a high risk ischemic area. Most recent echocardiogram 04/2024 showed RWMA that aligns with infarct above. Ischemic evaluation was not pursued  at that time given her AKI.   Given her AKI and recent contrast administration for CT imaging would not want to pursue cardiac catheterization today, but do wonder if further evaluation of her coronary anatomy would be beneficial especially prior to DCCV which would preclude her ability to go to the cath lab. Will discuss with MD.   Patient is on eliquis  for her AF, defer antiplatelet medications   Hyperlipidemia LDL 48   HDL 52  03/2024 Patient was on lipitor  which was removed with her recent rhabdomyolysis. Will order lipids for am to assess change since coming off statin and will place lipid clinic referral.   Chronic systolic heart failure Moderate to severe MR Apical HCM CXR shows vascular congestion CTA showed enlarged pulmonary artery suggesting pHTN BNP pending On exam patient does not appear volume up.   On chart review patient was on bi-dil at discharge 04/2024. Unsure why this medication was not on her med rec list. Entresto  on hold for AKI.  Continue coreg  25 mg BID Consider re-starting bidil prior to discharge, will hold for now as she did have some hypotension on arrival most likely from lopressor  administration.  Will continue to hold on ARB with renal function Will defer on SGLT2i with current bed sore Patient has allergy to spironolactone   Hypertension BP: 133/110 Medications as above.   AKI Cr on admission 1.54 CK 580 Per primary  Thoracic Aortic Aneurysm 4.4cm ascending TAA, unchanged from previous.  Follow up with CT in 1 year  Abdominal Aortic Aneurysm Noted on CTA, infrarenal aneurysm 3.0cm, unchanged from previous imaging.  Continue to monitor with US  in 3 years.   Bilateral Carotid Artery Stenosis Continue medical management as described above.    Per primary Anemia Hx of CVA Tobacco Use   For questions or updates, please contact [facility] Please consult [email or web address] for contact info under       Signed, Leontine LOISE Salen, PA-C     Patient seen and examined.  Agree with above documentation.  On exam, patient is alert and oriented, irregular rhythm, no murmurs, lungs CTAB, no LE edema.  She presented with A-fib with RVR, AKI, and troponin elevation.  For her A-fib, she reports compliance with Eliquis .  Rates remain elevated despite carvedilol  25 mg twice daily.  Will start IV amiodarone and plan for cardioversion tomorrow.  She denies any missed doses of Eliquis .  Suspect troponin elevation represents demand ischemia in setting of her acute illness.  She does report chest pain but reproducible with palpation, suspect MSK pain.  Given her AKI, not a candidate for invasive evaluation at this time.  Lonni LITTIE Nanas, MD

## 2024-05-28 NOTE — ED Notes (Signed)
 MD notified of pt's chest pain, dizziness, shob, and headache.

## 2024-05-28 NOTE — Assessment & Plan Note (Signed)
 Obtain anemia panel  Transfuse for Hg <7 , rapidly dropping or  if symptomatic

## 2024-05-28 NOTE — Assessment & Plan Note (Signed)
 Currently denies any chest pain appreciate cardiology involvement stable

## 2024-05-28 NOTE — Assessment & Plan Note (Signed)
 Chronic

## 2024-05-28 NOTE — Assessment & Plan Note (Signed)
-  chronic avoid nephrotoxic medications such as NSAIDs, Vanco Zosyn combo,  avoid hypotension, continue to follow renal function

## 2024-05-28 NOTE — ED Notes (Signed)
 Patient transported to CT

## 2024-05-28 NOTE — NC FL2 (Addendum)
 [place]  MEDICAID FL2 LEVEL OF CARE FORM     IDENTIFICATION  Patient Name: Kimberly [name] Birthdate: 1956/07/23 Sex: female Admission Date (Current Location): 05/27/2024  [place] and [place] Number:  [name] [id number] O Facility and Address:  [facility], [address] [place], [place] [address]      Provider Number: [id number]  Attending Physician Name and Address:  Fausto Burnard LABOR, DO  Relative Name and Phone Number:  Ann Ip (Sister)  [phone] [facility])    Current Level of Care: Hospital Recommended Level of Care: Skilled Nursing Facility Prior Approval Number:    Date Approved/Denied:   PASRR Number: [id number]  Discharge Plan: SNF    Current Diagnoses: Patient Active Problem List   Diagnosis Date Noted   Anemia 05/28/2024   Hypokalemia 05/28/2024   Prolonged QT interval 05/28/2024   Elevated troponin 04/26/2024   Accident due to mechanical fall without injury 04/26/2024   Non-traumatic rhabdomyolysis 04/26/2024   Lactic acidosis 04/26/2024   Near syncope 04/25/2024   Nasal congestion 04/21/2024   Hypertensive kidney disease with stage 3a chronic kidney disease (HCC) 04/21/2024   Acquired thrombophilia 04/21/2024   Other long term (current) drug therapy 04/02/2024   Viral URI 04/02/2024   Colon cancer screening 10/09/2023   History of smoking 25-50 pack years 10/09/2023   Encounter for annual health examination 10/09/2023   COVID-19 vaccination declined 10/09/2023   Wheelchair dependence 10/09/2023   Dyspnea on exertion 04/11/2023   Need for influenza vaccination 04/11/2023   Hypertensive heart disease without heart failure 04/11/2023   Lower extremity edema 04/11/2023   Pulmonary hypertension, unspecified (HCC) 12/11/2022   Cerebrovascular disease 11/02/2022   Family history of colon cancer 11/02/2022   Postural dizziness with near syncope 06/23/2022   COPD (chronic obstructive pulmonary disease) (HCC)  02/09/2022   Osteoporosis 02/07/2022   Pulmonary nodule 01/25/2021   Shortness of breath 01/25/2021   Sepsis (HCC) 12/18/2020   Family history of malignant neoplasm of gastrointestinal tract 12/07/2020   History of colonic polyps 12/07/2020   Secondary hypercoagulable state 10/14/2020   Right sided abdominal pain    Abdominal wall pain    Educated about COVID-19 virus infection 11/16/2019   Urinary frequency 04/14/2019   Cough 04/14/2019   AKI (acute kidney injury) 11/26/2018   Diarrhea 11/25/2018   Dehydration, mild 11/25/2018   Hemiparesis affecting nondominant 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 (HCC) 07/31/2018   Muscle ache 07/31/2018   Prediabetes 07/31/2018   Chronic gout without tophus 07/31/2018   Vision loss 07/31/2018   Malnutrition of moderate degree 02/15/2018   Carotid occlusion, bilateral    Atrial fibrillation with RVR (HCC) 02/13/2018   Positive D dimer 02/13/2018   Back pain 02/13/2018   History of CVA (cerebrovascular accident) 02/13/2018   Paroxysmal atrial fibrillation (HCC) 02/13/2018   Coronary artery disease due to lipid rich plaque 02/13/2018   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   Ascending aorta dilation 02/13/2018   Aortic atherosclerosis 02/13/2018   TOBACCO USE, QUIT 05/03/2009   Atherosclerosis of coronary artery bypass graft(s), unspecified, with other forms of angina pectoris    ROTATOR CUFF SYNDROME, LEFT 02/12/2009   Shoulder pain, left 11/03/2008   HAMMER TOE, ACQUIRED 02/11/2008   INSECT BITE, LEG 02/11/2008   Dyslipidemia 11/05/2007   UNSPECIFIED DISORDER TEETH&SUPPORTING STRUCTURES 11/05/2007   BICEPS TENDINITIS, RIGHT 04/24/2007   Essential hypertension 04/23/2007   MICROCYTOSIS 04/23/2007    Orientation RESPIRATION BLADDER Height & Weight     Self,  Time, Situation, Place  Normal Continent Weight: 125 lb (56.7 kg) (Wt from 04/25/2024) Height:  5' 7 (170.2 cm)  BEHAVIORAL SYMPTOMS/MOOD  NEUROLOGICAL BOWEL NUTRITION STATUS      Continent Diet (Please see discharge summary)  AMBULATORY STATUS COMMUNICATION OF NEEDS Skin   Extensive Assist Verbally Other (Comment) (Wound Pressure Injury Coccyx Mid Stage 3)                 Personal Care Assistance Level of Assistance  Bathing, Feeding, Dressing Bathing Assistance: Maximum assistance Feeding assistance: Independent Dressing Assistance: Maximum assistance     Functional Limitations Info  Sight, Hearing, Speech Sight Info: Impaired (reading glasses) Hearing Info: Adequate Speech Info: Adequate    SPECIAL CARE FACTORS FREQUENCY  PT (By licensed PT), OT (By licensed OT)     PT Frequency: 5x week OT Frequency: 5x week            Contractures Contractures Info: Not present    Additional Factors Info  Code Status, Allergies Code Status Info: Full Allergies Info: Catapres -tts (Clonidine ), Aldactone  (Spironolactone )           Current Medications (05/28/2024):  This is the current hospital active medication list Current Facility-Administered Medications  Medication Dose Route Frequency Provider Last Rate Last Admin   acetaminophen  (TYLENOL ) tablet 650 mg  650 mg Oral Q6H PRN Doutova, Anastassia, MD   650 mg at 05/28/24 0150   Or   acetaminophen  (TYLENOL ) suppository 650 mg  650 mg Rectal Q6H PRN Doutova, Anastassia, MD       amiodarone (NEXTERONE PREMIX) 360-4.14 MG/200ML-% (1.8 mg/mL) IV infusion  60 mg/hr Intravenous Continuous Garrick Leontine SAILOR, PA-C 33.3 mL/hr at 05/28/24 1541 60 mg/hr at 05/28/24 1541   Followed by   amiodarone (NEXTERONE PREMIX) 360-4.14 MG/200ML-% (1.8 mg/mL) IV infusion  30 mg/hr Intravenous Continuous Garrick Leontine SAILOR, PA-C       apixaban  (ELIQUIS ) tablet 5 mg  5 mg Oral BID Doutova, Anastassia, MD   5 mg at 05/28/24 1157   bisacodyl  (DULCOLAX) suppository 10 mg  10 mg Rectal Once Doutova, Anastassia, MD       carvedilol  (COREG ) tablet 25 mg  25 mg Oral BID WC Doutova, Anastassia, MD    25 mg at 05/28/24 0845   docusate sodium  (COLACE) capsule 100 mg  100 mg Oral BID Doutova, Anastassia, MD   100 mg at 05/28/24 1157   fentaNYL  (SUBLIMAZE ) injection 12.5-50 mcg  12.5-50 mcg Intravenous Q2H PRN Doutova, Anastassia, MD   12.5 mcg at 05/28/24 1552   HYDROcodone -acetaminophen  (NORCO/VICODIN) 5-325 MG per tablet 1-2 tablet  1-2 tablet Oral Q4H PRN Doutova, Anastassia, MD       mirtazapine  (REMERON ) tablet 15 mg  15 mg Oral QHS Doutova, Anastassia, MD   15 mg at 05/28/24 0151   pantoprazole  (PROTONIX ) EC tablet 40 mg  40 mg Oral Daily Doutova, Anastassia, MD   40 mg at 05/28/24 1157   polyethylene glycol (MIRALAX  / GLYCOLAX ) packet 17 g  17 g Oral Daily PRN Doutova, Anastassia, MD         Discharge Medications: Please see discharge summary for a list of discharge medications.  Relevant Imaging Results:  Relevant Lab Results:   Additional Information SSN: [id number]  Luise JAYSON Pan, LCSWA

## 2024-05-28 NOTE — ED Notes (Signed)
 Patients brief is changed and needed minimal assistance turning.

## 2024-05-28 NOTE — TOC Initial Note (Addendum)
 Transition of Care [facility]) - Initial/Assessment Note    Patient Details  Name: Kimberly [name] MRN: [id number] Date of Birth: 1956-05-25  [facility] CM/SW Contact:    Luise JAYSON Pan, LCSWA Phone Number: 05/28/2024, 4:08 PM  Clinical Narrative:     CSW spoke with patient about PT rec for SNF. Patient is agreeable and is okay with going to [facility] again, providing they have bed availability.   Per chart review, patient was at [facility] from 9/20-10/4. Patient still has rehab days with insurance that will be covered at 100%.   CSW will continue to follow.     Expected Discharge Plan: Skilled Nursing Facility Barriers to Discharge: Continued Medical Work up, SNF Pending bed offer, Insurance Authorization   Patient Goals and CMS Choice Patient states their goals for this hospitalization and ongoing recovery are:: to go to rehab          Expected Discharge Plan and Services In-house Referral: Clinical Social Work     Living arrangements for the past 2 months: Single Family Home                                      Prior Living Arrangements/Services Living arrangements for the past 2 months: Single Family Home Lives with:: Self Patient language and need for interpreter reviewed:: Yes Do you feel safe going back to the place where you live?: Yes      Need for Family Participation in Patient Care: No (Comment) Care giver support system in place?: Yes (comment) Current home services: Homehealth aide Criminal Activity/Legal Involvement Pertinent to Current Situation/Hospitalization: No - Comment as needed  Activities of Daily Living   ADL Screening (condition at time of admission) Independently performs ADLs?: No Does the patient have a NEW difficulty with bathing/dressing/toileting/self-feeding that is expected to last >3 days?: No Does the patient have a NEW difficulty with getting in/out of bed, walking, or climbing stairs that is expected to  last >3 days?: No Does the patient have a NEW difficulty with communication that is expected to last >3 days?: No Is the patient deaf or have difficulty hearing?: No Does the patient have difficulty seeing, even when wearing glasses/contacts?: No  Permission Sought/Granted Permission sought to share information with : Facility Medical sales representative, Family Supports Permission granted to share information with : Yes, Verbal Permission Granted  Share Information with NAME: Ann Ip  Permission granted to share info w AGENCY: SNFs  Permission granted to share info w Relationship: sister  Permission granted to share info w Contact Information: [phone]  Emotional Assessment Appearance:: Appears stated age Attitude/Demeanor/Rapport: Engaged Affect (typically observed): Pleasant Orientation: : Oriented to Situation, Oriented to  Time, Oriented to Place, Oriented to Self Alcohol / Substance Use: Not Applicable Psych Involvement: No (comment)  Admission diagnosis:  Atrial fibrillation with rapid ventricular response (HCC) [I48.91] Atrial fibrillation with RVR (HCC) [I48.91] Patient Active Problem List   Diagnosis Date Noted   Anemia 05/28/2024   Hypokalemia 05/28/2024   Prolonged QT interval 05/28/2024   Elevated troponin 04/26/2024   Accident due to mechanical fall without injury 04/26/2024   Non-traumatic rhabdomyolysis 04/26/2024   Lactic acidosis 04/26/2024   Near syncope 04/25/2024   Nasal congestion 04/21/2024   Hypertensive kidney disease with stage 3a chronic kidney disease (HCC) 04/21/2024   Acquired thrombophilia 04/21/2024   Other long term (current) drug therapy 04/02/2024   Viral URI 04/02/2024  Colon cancer screening 10/09/2023   History of smoking 25-50 pack years 10/09/2023   Encounter for annual health examination 10/09/2023   COVID-19 vaccination declined 10/09/2023   Wheelchair dependence 10/09/2023   Dyspnea on exertion 04/11/2023   Need for influenza  vaccination 04/11/2023   Hypertensive heart disease without heart failure 04/11/2023   Lower extremity edema 04/11/2023   Pulmonary hypertension, unspecified (HCC) 12/11/2022   Cerebrovascular disease 11/02/2022   Family history of colon cancer 11/02/2022   Postural dizziness with near syncope 06/23/2022   COPD (chronic obstructive pulmonary disease) (HCC) 02/09/2022   Osteoporosis 02/07/2022   Pulmonary nodule 01/25/2021   Shortness of breath 01/25/2021   Sepsis (HCC) 12/18/2020   Family history of malignant neoplasm of gastrointestinal tract 12/07/2020   History of colonic polyps 12/07/2020   Secondary hypercoagulable state 10/14/2020   Right sided abdominal pain    Abdominal wall pain    Educated about COVID-19 virus infection 11/16/2019   Urinary frequency 04/14/2019   Cough 04/14/2019   AKI (acute kidney injury) 11/26/2018   Diarrhea 11/25/2018   Dehydration, mild 11/25/2018   Hemiparesis affecting nondominant 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 (HCC) 07/31/2018   Muscle ache 07/31/2018   Prediabetes 07/31/2018   Chronic gout without tophus 07/31/2018   Vision loss 07/31/2018   Malnutrition of moderate degree 02/15/2018   Carotid occlusion, bilateral    Atrial fibrillation with RVR (HCC) 02/13/2018   Positive D dimer 02/13/2018   Back pain 02/13/2018   History of CVA (cerebrovascular accident) 02/13/2018   Paroxysmal atrial fibrillation (HCC) 02/13/2018   Coronary artery disease due to lipid rich plaque 02/13/2018   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   Ascending aorta dilation 02/13/2018   Aortic atherosclerosis 02/13/2018   TOBACCO USE, QUIT 05/03/2009   Atherosclerosis of coronary artery bypass graft(s), unspecified, with other forms of angina pectoris    ROTATOR CUFF SYNDROME, LEFT 02/12/2009   Shoulder pain, left 11/03/2008   HAMMER TOE, ACQUIRED 02/11/2008   INSECT BITE, LEG 02/11/2008   Dyslipidemia 11/05/2007   UNSPECIFIED  DISORDER TEETH&SUPPORTING STRUCTURES 11/05/2007   BICEPS TENDINITIS, RIGHT 04/24/2007   Essential hypertension 04/23/2007   MICROCYTOSIS 04/23/2007   PCP:  Georgina Speaks, FNP Pharmacy:   [facility] [place], [place] - [address] [place] [address] Phone: [phone] Fax: [phone]  [id number] - [place], [place] - [address] AT [facility] [address] [place] [address] Phone: [phone] Fax: [phone]     Social Drivers of Health (SDOH) Social History: SDOH Screenings   Food Insecurity: No Food Insecurity (05/28/2024)  Housing: High Risk (05/28/2024)  Transportation Needs: No Transportation Needs (05/28/2024)  Utilities: At Risk (05/28/2024)  Alcohol Screen: Low Risk  (09/21/2023)  Depression (PHQ2-9): Low Risk  (04/02/2024)  Financial Resource Strain: Low Risk  (09/21/2023)  Physical Activity: Unknown (09/21/2023)  Social Connections: Socially Isolated (05/28/2024)  Stress: No Stress Concern Present (09/21/2023)  Tobacco Use: Medium Risk (05/27/2024)  Health Literacy: Inadequate Health Literacy (09/21/2023)   SDOH Interventions:     Readmission Risk Interventions     No data to display

## 2024-05-28 NOTE — Assessment & Plan Note (Signed)
-   will replace electrolytes and repeat  check Mg, phos and Ca level and replace as needed Monitor on telemetry   Lab Results  Component Value Date   K 3.4 (L) 05/27/2024     Lab Results  Component Value Date   CREATININE 1.54 (H) 05/27/2024   Lab Results  Component Value Date   MG 1.8 05/27/2024   Lab Results  Component Value Date   CALCIUM  9.6 05/27/2024   PHOS 3.8 05/01/2024

## 2024-05-28 NOTE — Assessment & Plan Note (Signed)
 Obtain CT abd and pelvis to further eval

## 2024-05-28 NOTE — Plan of Care (Signed)
  Problem: Clinical Measurements: Goal: Ability to maintain clinical measurements within normal limits will improve Outcome: Progressing Goal: Diagnostic test results will improve Outcome: Progressing   

## 2024-05-28 NOTE — Progress Notes (Signed)
 Progress Note   Patient: Kimberly [name] FMW:[id number] DOB: Apr 16, 1956 DOA: 05/27/2024     0 DOS: the patient was seen and examined on 05/28/2024   Brief hospital course: Kimberly [name] is a 68 y.o. 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  She was found to be in A-fib RVR with HR in the 130's.  This was initially treated with IV push metoprolol  with some transient improvement.  Pt was admitted to Hospitalist service with Cardiology consulted for further evaluation and management as outlined in detail below.   10/15: Pt continued to have palpitations, chest pressure.  Cardiology saw pt. Started on amiodarone drip.    Assessment and Plan:  A-fib with RVR -- initially HR's in 130's --Cardiology following --Started on amiodarone drip --Possible DCCV tomorrow --Telemetry monitoring --Replace K and Mg as needed --Monitor Qtc closely on amio --Continue Eliquis  5 mg BID --Continue Coreg  25 mg BID  Elevated troponin -- trend 354 >> 407 >> 437 >> 501 >> 616.  No acute ischemic EKG changes.  Suspect Demand Ischemia in setting of A-fib with RVR --Cardiology following --Hx of known CAD, cardiology considering ischemic evaluation   AKI - Cr 1.54 on admission.  CK 580 (improved from recent rhabdo) --Avoid nephrotoxins & hypotension --Given gentle IV fluids x 10 hrs on admission --Repeat BMP in AM --Further gentle hydration as needed  CAD s/p CABG 2008 --As above --Repeat EKG and troponin if chest pain --On Eliquis , not on antiplatelet therapy --Off Lipitor  due to recent rhabdomyolysis  Hyperlipidemia --Off Lipitor  due to recent rhabdomyolysis --Follow up repeat lipid profile  Chronic  systolic CHF Moderate to Severe MR Apical Hypertrophic Cardiomyopathy Appears euvolemic, compensated. --Mgmt per Cardiology --On Coreg  25 mg BID --ARB on hold for renal function --Poor candidate for SGLT2i due to bed sore --Allergic to spironolonactone  Hypertension --Meds as above  Thoracic aortic aneurysm -- 4.4 cm ascending -- stable from prior imaging --Repeat CT in 1 year  Abdominal aortic aneurysm -- infra-renal 3.0 cm, unchanged from prior imaging --U/S in 3 years  Bilateral carotid artery stenosis --  --Medical mgmt as above  Hx of CVA - no acute changes --On Eliquis .  Off statin as above --Monitor neurologic status  Chronic Anemia - Hbg stable --Monitor CBC  Tobacco use - counseled on cessation  Pressure injury of skin -- POA --Wound care RN consulted       Subjective: Pt seen in the ED holding for a bed today. She reports chest pressure and palpitations continue, but not worsening.  No other acute complaints.  Wanted to make sure we know about her bed sore she states present since she was in rehab.   Physical Exam: Vitals:   05/28/24 1430 05/28/24 1445 05/28/24 1500 05/28/24 1555  BP: (!) 120/94 (!) 107/95 (!) 117/97 (!) 123/100  Pulse:   90 (!) 101  Resp: 15 16 10 16   Temp:    97.9 F (36.6 C)  TempSrc:    Oral  SpO2:   98% 97%  Weight:      Height:       General exam: awake, alert, no acute distress HEENT: atraumatic, clear conjunctiva, anicteric sclera, moist mucus membranes, hearing grossly normal  Respiratory system: CTAB, no wheezes, rales or rhonchi, normal respiratory effort.  Cardiovascular system: normal S1/S2, irregularly irregular, no pedal edema.   Gastrointestinal system: soft, NT, ND, no HSM felt, +bowel sounds. Central nervous system: A&O x 3. no gross focal neurologic deficits, normal speech Extremities: moves all, no edema, normal tone Skin: dry, intact, normal temperature Psychiatry: normal mood, congruent affect, judgement and  insight appear normal   Data Reviewed:  Notable labs -- bicarb 19, BUN 33, Cr 1.54 >> 1.43, Ca 8.6, albumin 3.2, troponin trend peaked at 616 >> 47j8, Hbg 10.4 >> 9.0 (after IV fluids ?dilution)  Family Communication: None present on rounds.  Disposition: Status is: Inpatient Remains inpatient appropriate because: on IV amiodarone, cardiology considering DCCV   Planned Discharge Destination: Home with Home Health    Time spent: 45 minutes  Author: Burnard DELENA Cunning, DO 05/28/2024 4:35 PM  For on call review [email or web address].

## 2024-05-29 ENCOUNTER — Other Ambulatory Visit (HOSPITAL_COMMUNITY): Payer: Self-pay

## 2024-05-29 ENCOUNTER — Inpatient Hospital Stay (HOSPITAL_COMMUNITY): Admitting: Certified Registered"

## 2024-05-29 ENCOUNTER — Encounter (HOSPITAL_COMMUNITY): Payer: Self-pay | Admitting: Internal Medicine

## 2024-05-29 ENCOUNTER — Encounter (HOSPITAL_COMMUNITY)
Admission: EM | Disposition: A | Discharge status: Skilled Nursing Facility | Payer: Self-pay | Source: Home / Self Care | Attending: Internal Medicine

## 2024-05-29 ENCOUNTER — Telehealth (HOSPITAL_COMMUNITY): Payer: Self-pay

## 2024-05-29 DIAGNOSIS — Z87891 Personal history of nicotine dependence: Secondary | ICD-10-CM

## 2024-05-29 DIAGNOSIS — I10 Essential (primary) hypertension: Secondary | ICD-10-CM | POA: Diagnosis not present

## 2024-05-29 DIAGNOSIS — I2510 Atherosclerotic heart disease of native coronary artery without angina pectoris: Secondary | ICD-10-CM

## 2024-05-29 DIAGNOSIS — I5022 Chronic systolic (congestive) heart failure: Secondary | ICD-10-CM | POA: Diagnosis not present

## 2024-05-29 DIAGNOSIS — N179 Acute kidney failure, unspecified: Secondary | ICD-10-CM | POA: Diagnosis not present

## 2024-05-29 DIAGNOSIS — I4891 Unspecified atrial fibrillation: Secondary | ICD-10-CM | POA: Diagnosis not present

## 2024-05-29 DIAGNOSIS — I4892 Unspecified atrial flutter: Secondary | ICD-10-CM | POA: Diagnosis not present

## 2024-05-29 DIAGNOSIS — E876 Hypokalemia: Secondary | ICD-10-CM | POA: Diagnosis not present

## 2024-05-29 HISTORY — PX: CARDIOVERSION: EP1203

## 2024-05-29 LAB — BASIC METABOLIC PANEL WITH GFR
Anion gap: 9 (ref 5–15)
BUN: 28 mg/dL — ABNORMAL HIGH (ref 8–23)
CO2: 19 mmol/L — ABNORMAL LOW (ref 22–32)
Calcium: 8.2 mg/dL — ABNORMAL LOW (ref 8.9–10.3)
Chloride: 109 mmol/L (ref 98–111)
Creatinine, Ser: 1.32 mg/dL — ABNORMAL HIGH (ref 0.44–1.00)
GFR, Estimated: 44 mL/min — ABNORMAL LOW (ref >60)
Glucose, Bld: 90 mg/dL (ref 70–99)
Potassium: 3.2 mmol/L — ABNORMAL LOW (ref 3.5–5.1)
Sodium: 137 mmol/L (ref 135–145)

## 2024-05-29 LAB — LIPID PANEL
Cholesterol: 144 mg/dL (ref 0–200)
HDL: 49 mg/dL (ref >40)
LDL Cholesterol: 80 mg/dL (ref 0–99)
Total CHOL/HDL Ratio: 2.9 ratio
Triglycerides: 76 mg/dL (ref <150)
VLDL: 15 mg/dL (ref 0–40)

## 2024-05-29 LAB — CBC
HCT: 29.3 % — ABNORMAL LOW (ref 36.0–46.0)
Hemoglobin: 9.2 g/dL — ABNORMAL LOW (ref 12.0–15.0)
MCH: 22.8 pg — ABNORMAL LOW (ref 26.0–34.0)
MCHC: 31.4 g/dL (ref 30.0–36.0)
MCV: 72.7 fL — ABNORMAL LOW (ref 80.0–100.0)
Platelets: 152 K/uL (ref 150–400)
RBC: 4.03 MIL/uL (ref 3.87–5.11)
RDW: 16.7 % — ABNORMAL HIGH (ref 11.5–15.5)
WBC: 5.9 K/uL (ref 4.0–10.5)
nRBC: 0 % (ref 0.0–0.2)

## 2024-05-29 LAB — GLUCOSE, CAPILLARY
Glucose-Capillary: 100 mg/dL — ABNORMAL HIGH (ref 70–99)
Glucose-Capillary: 84 mg/dL (ref 70–99)

## 2024-05-29 LAB — MAGNESIUM: Magnesium: 2.2 mg/dL (ref 1.7–2.4)

## 2024-05-29 SURGERY — CARDIOVERSION (CATH LAB)
Anesthesia: General

## 2024-05-29 MED ORDER — EPHEDRINE SULFATE-NACL 50-0.9 MG/10ML-% IV SOSY
PREFILLED_SYRINGE | INTRAVENOUS | Status: DC | PRN
  Administered 2024-05-29: 15 mg via INTRAVENOUS

## 2024-05-29 MED ORDER — PROPOFOL 10 MG/ML IV BOLUS
INTRAVENOUS | Status: DC | PRN
  Administered 2024-05-29: 50 mg via INTRAVENOUS

## 2024-05-29 MED ORDER — POTASSIUM CHLORIDE CRYS ER 20 MEQ PO TBCR: 40.0000 meq | EXTENDED_RELEASE_TABLET | Freq: Once | ORAL | Status: DC

## 2024-05-29 MED ORDER — POTASSIUM CHLORIDE CRYS ER 20 MEQ PO TBCR
40.0000 meq | EXTENDED_RELEASE_TABLET | Freq: Two times a day (BID) | ORAL | Status: AC
  Administered 2024-05-29 (×2): 40 meq via ORAL
  Filled 2024-05-29 (×2): qty 2

## 2024-05-29 MED ORDER — METOPROLOL TARTRATE 25 MG PO TABS
25.0000 mg | ORAL_TABLET | Freq: Two times a day (BID) | ORAL | Status: AC
Start: 2024-05-29 — End: ?
  Administered 2024-05-29: 25 mg via ORAL
  Filled 2024-05-29: qty 1

## 2024-05-29 MED ORDER — IPRATROPIUM-ALBUTEROL 0.5-2.5 (3) MG/3ML IN SOLN
3.0000 mL | Freq: Four times a day (QID) | RESPIRATORY_TRACT | Status: DC | PRN
  Administered 2024-05-29: 3 mL via RESPIRATORY_TRACT
  Filled 2024-05-29: qty 3

## 2024-05-29 MED ORDER — ALBUMIN HUMAN 5 % IV SOLN: INTRAVENOUS | Status: DC | PRN

## 2024-05-29 MED ORDER — LIDOCAINE 2% (20 MG/ML) 5 ML SYRINGE
INTRAMUSCULAR | Status: DC | PRN
  Administered 2024-05-29: 100 mg via INTRAVENOUS

## 2024-05-29 MED ORDER — ATROPINE SULFATE 0.4 MG/ML IV SOSY
PREFILLED_SYRINGE | INTRAMUSCULAR | Status: DC | PRN
  Administered 2024-05-29: .4 mg via INTRAVENOUS

## 2024-05-29 MED ORDER — AMIODARONE HCL 200 MG PO TABS
200.0000 mg | ORAL_TABLET | Freq: Two times a day (BID) | ORAL | Status: DC
  Administered 2024-05-29 – 2024-06-04 (×12): 200 mg via ORAL
  Filled 2024-05-29 (×12): qty 1

## 2024-05-29 MED ORDER — PHENYLEPHRINE 80 MCG/ML (10ML) SYRINGE FOR IV PUSH (FOR BLOOD PRESSURE SUPPORT)
PREFILLED_SYRINGE | INTRAVENOUS | Status: DC | PRN
  Administered 2024-05-29 (×5): 160 ug via INTRAVENOUS

## 2024-05-29 SURGICAL SUPPLY — 1 items: PAD DEFIB RADIO PHYSIO CONN (PAD) ×2 IMPLANT

## 2024-05-29 NOTE — Anesthesia Postprocedure Evaluation (Signed)
 Anesthesia Post Note  Patient: Kimberly [name]  Procedure(s) Performed: CARDIOVERSION     Patient location during evaluation: PACU Anesthesia Type: General Level of consciousness: awake and alert Pain management: pain level controlled Vital Signs Assessment: post-procedure vital signs reviewed and stable Respiratory status: spontaneous breathing, nonlabored ventilation, respiratory function stable and patient connected to nasal cannula oxygen Cardiovascular status: blood pressure returned to baseline and stable Postop Assessment: no apparent nausea or vomiting Anesthetic complications: no   No notable events documented.  Last Vitals:  Vitals:   05/29/24 1244 05/29/24 1254  BP: 100/79 96/74  Pulse: (!) 56 (!) 53  Resp: 12 16  Temp:    SpO2: 100% 98%    Last Pain:  Vitals:   05/29/24 1254  TempSrc:   PainSc: 9                  Thom JONELLE Peoples

## 2024-05-29 NOTE — Progress Notes (Addendum)
 Progress Note  Patient Name: Kimberly [name] Date of Encounter: 05/29/2024 [facility] Cardiologist: Lynwood Schilling, MD   Interval Summary   Reported lightheadedness, dizziness and palpitations. Denied chest pain and shortness of breath.  Reported she was hesitant about the cardioversion due to misunderstanding- she thought it was a surgery, I did clarify it is a procedure. She is agreeable to proceeding as long as her sister also agrees.  Vital Signs Vitals:   05/28/24 2316 05/29/24 0318 05/29/24 0449 05/29/24 0725  BP: 114/88 (!) 117/100 (!) 123/92 109/86  Pulse: 75 93 89 94  Resp: 15 12 13 17   Temp: 97.9 F (36.6 C) (!) 97.4 F (36.3 C)  (!) 97.5 F (36.4 C)  TempSrc: Oral Oral  Oral  SpO2: 97% 99% 100% 100%  Weight:      Height:        Intake/Output Summary (Last 24 hours) at 05/29/2024 0836 Last data filed at 05/29/2024 0400 Gross per 24 hour  Intake 240 ml  Output 200 ml  Net 40 ml      05/28/2024    3:53 PM 05/28/2024   12:22 AM 04/25/2024    4:25 PM  Last 3 Weights  Weight (lbs) 139 lb 12.4 oz 125 lb 125 lb  Weight (kg) 63.4 kg 56.7 kg 56.7 kg      Telemetry/ECG  AFL conversion at 0300 HR 94- Personally Reviewed  Physical Exam  GEN: No acute distress.   Neck: + JVD Cardiac: RRR, no murmurs, rubs, or gallops. Radial pulses 2+ bilaterally Respiratory: Clear to auscultation bilaterally. GI: Tight, though compressible, tenderness to RUQ MS: No edema  Assessment & Plan  Kimberly [name] is a 68 y.o. female with a hx of paroxysmal atrial fibrillation on eliquis , coronary artery disease s/p CABG in 2008, apical variant HCM, congestive heart failure, CVA in 2019, HTN, hyperlipidemia, and tobacco use who presented to the ED on 10/14 for chest/back pain, urinary frequency and shortness of breath. BP:121/100 and in AF RVR  [HR 126]. She was given pain medication, IV fluids, IV lopressor  x2 and oral lopressor , and electrolyte repletion. Troponin  elevation noted. Cardiology consulted.    Atrial Fibrillation with episode of RVR/ Atrial Flutter Demand Ischemia Patient reports she has not missed any home eliquis  doses. Currently rate controlled atrial flutter, convert to AFL around 0300. Will order repeat 12-lead ECG   Discussed with patient and her sister, she is agreeable with proceeding with cardioversion today   Continue eliquis  5 mg BID Continue coreg  25mg  BID Continue IV amiodarone   Keep K > 4 and Mag >2 K today 3.2, ordered repletion  Chronic systolic heart failure Moderate to severe MR Apical HCM On chart review patient was on bi-dil at discharge 04/2024. Unsure why this medication was not on her med rec list. Entresto  on hold for AKI. CXR shows vascular congestion CTA showed enlarged pulmonary artery suggesting pHTN BNP 1244 On exam patient appears volume up, has received multiple doses of IV fluids/electrolyte repletion as well as has IV amiodarone currently running. Will hold on diuresis today given AKI is improving, will discontinue the IV fluids ordered for DCCV today    Continue coreg  25 mg BID Consider re-starting bidil prior to discharge as BP permits, currently hypotensive  Will continue to hold on ARB with renal function Will defer on SGLT2i with current bed sore Patient has allergy to spironolactone   CAD s/p CABG in 2008 Myoview  in 2021 that showed infarct in the basal inferolateral,  basal anterolateral, and mid inferolateral, mid anterolateral, and apical lateral location. No further work-up was pursued as it was not deemed a high risk ischemic area. Most recent echocardiogram 04/2024 showed RWMA that aligns with infarct above. Ischemic evaluation was not pursued at that time given her AKI. Considered this admission pursuing ischemic evaluation, however given recurrent AKI will hold off.    Hyperlipidemia Patient was on lipitor  which was removed with her recent rhabdomyolysis. Lipid panel pending.     Hypertension BP: 109/86 Medications as above.    AKI Cr today 1.32 Per primary   Thoracic Aortic Aneurysm 4.4cm ascending TAA, unchanged from previous.  Follow up with CT in 1 year   Abdominal Aortic Aneurysm Noted on CTA, infrarenal aneurysm 3.0cm, unchanged from previous imaging.  Continue to monitor with US  in 3 years.    Bilateral Carotid Artery Stenosis Continue medical management as described above.    Per primary Anemia Hx of CVA Tobacco Use   Signed, Leontine LOISE Salen, PA-C  05/29/2024 9:07 AM     For questions or updates, please contact [facility] Please consult [email or web address] for contact info under       Signed, Leontine LOISE Salen, PA-C   Patient seen and examined.  Agree with above documentation.  On exam, patient is alert and oriented, regular rate and rhythm, no murmurs, lungs CTAB, no LE edema, +JVD.  BP 109/86.  Renal function improving, creatinine 1.32 today.  Hemoglobin stable at 9.2.  She is currently in rate controlled atrial flutter with rate 90s.  Plan cardioversion today.  She requested that I call her sister.  Spoke with sister and patient, they are agreeable with proceeding with cardioversion.  Lonni LITTIE Nanas, MD

## 2024-05-29 NOTE — Transfer of Care (Signed)
 Immediate Anesthesia Transfer of Care Note  Patient: Kimberly [name]  Procedure(s) Performed: CARDIOVERSION  Patient Location: PACU  Anesthesia Type:MAC  Level of Consciousness: drowsy and patient cooperative  Airway & Oxygen Therapy: Patient connected to nasal cannula oxygen  Post-op Assessment: Report given to RN and Post -op Vital signs reviewed and stable  Post vital signs: Reviewed and stable  Last Vitals:  Vitals Value Taken Time  BP 136/100 05/29/24 12:24  Temp 36.6 C 05/29/24 12:24  Pulse 40 05/29/24 12:24  Resp 13 05/29/24 12:24  SpO2 100 % 05/29/24 12:24    Last Pain:  Vitals:   05/29/24 1224  TempSrc: Tympanic  PainSc: Asleep         Complications: No notable events documented.

## 2024-05-29 NOTE — CV Procedure (Signed)
   DIRECT CURRENT CARDIOVERSION  NAME:  Kimberly [name]    MRN: [id number] DOB:  [birth year]    ADMIT DATE: 05/27/2024  Indication:  Symptomatic atrial flutter  Procedure Note:  The patient signed informed consent.  They have had had therapeutic anticoagulation with eliquis  greater than 3 weeks.  Anesthesia was administered by Dr. Erma.  Adequate airway was maintained throughout and vital followed per protocol.  They were cardioverted x 1 with 200J of biphasic synchronized energy.  They converted to NSR.  There were no apparent complications.  The patient had normal neuro status and respiratory status post procedure with vitals stable as recorded elsewhere.    Follow up: They will continue on current medical therapy and follow up with cardiology as scheduled.  Darryle T. Barbaraann, MD, [facility]  [facility]  [address], [address] [place], [place] [address] [phone]  12:20 PM

## 2024-05-29 NOTE — H&P (View-Only) (Signed)
 Progress Note  Patient Name: Kimberly [name] Date of Encounter: 05/29/2024 [facility] Cardiologist: Lynwood Schilling, MD   Interval Summary   Reported lightheadedness, dizziness and palpitations. Denied chest pain and shortness of breath.  Reported she was hesitant about the cardioversion due to misunderstanding- she thought it was a surgery, I did clarify it is a procedure. She is agreeable to proceeding as long as her sister also agrees.  Vital Signs Vitals:   05/28/24 2316 05/29/24 0318 05/29/24 0449 05/29/24 0725  BP: 114/88 (!) 117/100 (!) 123/92 109/86  Pulse: 75 93 89 94  Resp: 15 12 13 17   Temp: 97.9 F (36.6 C) (!) 97.4 F (36.3 C)  (!) 97.5 F (36.4 C)  TempSrc: Oral Oral  Oral  SpO2: 97% 99% 100% 100%  Weight:      Height:        Intake/Output Summary (Last 24 hours) at 05/29/2024 0836 Last data filed at 05/29/2024 0400 Gross per 24 hour  Intake 240 ml  Output 200 ml  Net 40 ml      05/28/2024    3:53 PM 05/28/2024   12:22 AM 04/25/2024    4:25 PM  Last 3 Weights  Weight (lbs) 139 lb 12.4 oz 125 lb 125 lb  Weight (kg) 63.4 kg 56.7 kg 56.7 kg      Telemetry/ECG  AFL conversion at 0300 HR 94- Personally Reviewed  Physical Exam  GEN: No acute distress.   Neck: + JVD Cardiac: RRR, no murmurs, rubs, or gallops. Radial pulses 2+ bilaterally Respiratory: Clear to auscultation bilaterally. GI: Tight, though compressible, tenderness to RUQ MS: No edema  Assessment & Plan  Kimberly [name] is a 68 y.o. female with a hx of paroxysmal atrial fibrillation on eliquis , coronary artery disease s/p CABG in 2008, apical variant HCM, congestive heart failure, CVA in 2019, HTN, hyperlipidemia, and tobacco use who presented to the ED on 10/14 for chest/back pain, urinary frequency and shortness of breath. BP:121/100 and in AF RVR  [HR 126]. She was given pain medication, IV fluids, IV lopressor  x2 and oral lopressor , and electrolyte repletion. Troponin  elevation noted. Cardiology consulted.    Atrial Fibrillation with episode of RVR/ Atrial Flutter Demand Ischemia Patient reports she has not missed any home eliquis  doses. Currently rate controlled atrial flutter, convert to AFL around 0300. Will order repeat 12-lead ECG   Discussed with patient and her sister, she is agreeable with proceeding with cardioversion today   Continue eliquis  5 mg BID Continue coreg  25mg  BID Continue IV amiodarone   Keep K > 4 and Mag >2 K today 3.2, ordered repletion  Chronic systolic heart failure Moderate to severe MR Apical HCM On chart review patient was on bi-dil at discharge 04/2024. Unsure why this medication was not on her med rec list. Entresto  on hold for AKI. CXR shows vascular congestion CTA showed enlarged pulmonary artery suggesting pHTN BNP 1244 On exam patient appears volume up, has received multiple doses of IV fluids/electrolyte repletion as well as has IV amiodarone currently running. Will hold on diuresis today given AKI is improving, will discontinue the IV fluids ordered for DCCV today    Continue coreg  25 mg BID Consider re-starting bidil prior to discharge as BP permits, currently hypotensive  Will continue to hold on ARB with renal function Will defer on SGLT2i with current bed sore Patient has allergy to spironolactone   CAD s/p CABG in 2008 Myoview  in 2021 that showed infarct in the basal inferolateral,  basal anterolateral, and mid inferolateral, mid anterolateral, and apical lateral location. No further work-up was pursued as it was not deemed a high risk ischemic area. Most recent echocardiogram 04/2024 showed RWMA that aligns with infarct above. Ischemic evaluation was not pursued at that time given her AKI. Considered this admission pursuing ischemic evaluation, however given recurrent AKI will hold off.    Hyperlipidemia Patient was on lipitor  which was removed with her recent rhabdomyolysis. Lipid panel pending.     Hypertension BP: 109/86 Medications as above.    AKI Cr today 1.32 Per primary   Thoracic Aortic Aneurysm 4.4cm ascending TAA, unchanged from previous.  Follow up with CT in 1 year   Abdominal Aortic Aneurysm Noted on CTA, infrarenal aneurysm 3.0cm, unchanged from previous imaging.  Continue to monitor with US  in 3 years.    Bilateral Carotid Artery Stenosis Continue medical management as described above.    Per primary Anemia Hx of CVA Tobacco Use   Signed, Leontine LOISE Salen, PA-C  05/29/2024 9:07 AM     For questions or updates, please contact [facility] Please consult [email or web address] for contact info under       Signed, Leontine LOISE Salen, PA-C   Patient seen and examined.  Agree with above documentation.  On exam, patient is alert and oriented, regular rate and rhythm, no murmurs, lungs CTAB, no LE edema, +JVD.  BP 109/86.  Renal function improving, creatinine 1.32 today.  Hemoglobin stable at 9.2.  She is currently in rate controlled atrial flutter with rate 90s.  Plan cardioversion today.  She requested that I call her sister.  Spoke with sister and patient, they are agreeable with proceeding with cardioversion.  Lonni LITTIE Nanas, MD

## 2024-05-29 NOTE — TOC Progression Note (Addendum)
 Transition of Care [facility]) - Progression Note    Patient Details  Name: Kimberly [name] MRN: [id number] Date of Birth: [birth year]  [facility] CM/SW Contact  Luise JAYSON Pan, [place] Phone Number: 05/29/2024, 11:44 AM  Clinical Narrative:  CSW spoke with patients daughter, [name], about [facility] accepting patient for SNF. Per [name], patient to go for an emergency heart procedure.   [place] with [facility] called CSW to inform that patient owes facility over $1000+ and would need to agree to pay the balance before they will consider patient returning to them. CSW inquired with Leontine Pam AD, about what the outstanding balance is for and if medicaid will cover any expenses as paitent has [employer].   11:57 AM Per Leontine, insurance was no longer paying for rehab at facility. Patient privately paid for 1 week. Patients balance comes from patient not paying full private pay balance.   [facility] has changed bed offer status to considering at this time. CSW to follow up with family at a later time as patient is currently in a heart procedure.   Barriers to placement: [facility] is the only bed offer at this time, patient owes facility  CSW will continue to follow.    Expected Discharge Plan: Skilled Nursing Facility Barriers to Discharge: Continued Medical Work up, SNF Pending bed offer, Insurance Authorization               Expected Discharge Plan and Services In-house Referral: Clinical Social Work     Living arrangements for the past 2 months: Single Family Home                                       Social Drivers of Health (SDOH) Interventions SDOH Screenings   Food Insecurity: No Food Insecurity (05/28/2024)  Housing: High Risk (05/28/2024)  Transportation Needs: No Transportation Needs (05/28/2024)  Utilities: At Risk (05/28/2024)  Alcohol Screen: Low Risk  (09/21/2023)  Depression (PHQ2-9): Low Risk  (04/02/2024)  Financial Resource  Strain: Low Risk  (09/21/2023)  Physical Activity: Unknown (09/21/2023)  Social Connections: Socially Isolated (05/28/2024)  Stress: No Stress Concern Present (09/21/2023)  Tobacco Use: Medium Risk (05/29/2024)  Health Literacy: Inadequate Health Literacy (09/21/2023)    Readmission Risk Interventions     No data to display

## 2024-05-29 NOTE — Progress Notes (Addendum)
 Progress Note   Patient: Kimberly [name] FMW:[id number] DOB: Nov 23, 1955 DOA: 05/27/2024     1 DOS: the patient was seen and examined on 05/29/2024   Brief hospital course: Kimberly [name] is a 68 y.o. 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  She was found to be in A-fib RVR with HR in the 130's.  This was initially treated with IV push metoprolol  with some transient improvement.  Pt was admitted to Hospitalist service with Cardiology consulted for further evaluation and management as outlined in detail below.   10/15: Pt continued to have palpitations, chest pressure.  Cardiology saw pt. Started on amiodarone drip.    Assessment and Plan:  A-fib with RVR -- initially HR's in 130's Underwent successful cardioversion this AM HR in 50's on monitor during rounds post-procedure. Off amiodarone drip. --Cardiology following --Transitioned to PO amiodarone 200 mg BID --Telemetry monitoring --Replace K and Mg as needed --Monitor Qtc closely on amio --Continue Eliquis  5 mg BID --Continue Coreg  25 mg BID  Elevated troponin -- trend 354 >> 407 >> 437 >> 501 >> 616 >> 478>> 450.  No acute ischemic EKG changes.  Suspect Demand Ischemia in setting of A-fib with RVR --Cardiology following --Needs ischemic evaluation when renal function is improved   AKI - Cr 1.54 on admission >> 1.32.  CK 580 (improved from recent rhabdo) --Avoid nephrotoxins & hypotension --Given gentle IV fluids x 10 hrs on admission --Repeat BMP in AM --Further gentle hydration as needed  Hypokalemia - K 3.2 this AM --replace with 40 mEq x 2 doses PO Kcl --Monitor BMP, Mg  CAD s/p CABG 2008 --As above --Repeat EKG and  troponin if chest pain --On Eliquis , not on antiplatelet therapy --Off Lipitor  due to recent rhabdomyolysis  Hyperlipidemia --Off Lipitor  due to recent rhabdomyolysis --Follow up repeat lipid profile  Chronic systolic CHF Moderate to Severe MR Apical Hypertrophic Cardiomyopathy Appears euvolemic, compensated. --Mgmt per Cardiology --On Coreg  25 mg BID --ARB on hold for renal function --Poor candidate for SGLT2i due to bed sore --Allergic to spironolonactone  Hypertension --Meds as above  Thoracic aortic aneurysm -- 4.4 cm ascending -- stable from prior imaging --Repeat CT in 1 year  Abdominal aortic aneurysm -- infra-renal 3.0 cm, unchanged from prior imaging --U/S in 3 years  Bilateral carotid artery stenosis --  --Medical mgmt as above  Hx of CVA - no acute changes --On Eliquis .  Off statin as above --Monitor neurologic status  Chronic Anemia - Hbg stable --Monitor CBC  Tobacco use - counseled on cessation  Pressure injury of skin -- POA --Wound care RN consulted --Continue wound care, off-loading pressures areas, closely monitor        Subjective: Pt seen after cardioversion today.  She reports some wheezing since this morning. Denies known history of COPD or asthma.  Otherwise feels better, no chest pain, palpitations, dyspnea or other complaints.   Physical Exam: Vitals:   05/29/24 1254 05/29/24 1304 05/29/24 1335 05/29/24 1400  BP: 96/74 102/74 97/74   Pulse: (!) 53 (!) 55  (!) 50  Resp: 16 15 (!) 23 17  Temp:      TempSrc:      SpO2: 98% 100% 99% 100%  Weight:      Height:  General exam: awake, alert, no acute distress HEENT: atraumatic, clear conjunctiva, anicteric sclera, moist mucus membranes, hearing grossly normal  Respiratory system: CTAB, intermittent expiratory wheeze, no rhonchi, normal respiratory effort. Cardiovascular system: normal S1/S2, RRR, no pedal edema.   Gastrointestinal system: soft, NT, ND Central nervous system: A&O  x 3. no gross focal neurologic deficits, normal speech Extremities: moves all, no edema, normal tone Skin: dry, intact, normal temperature Psychiatry: normal mood, congruent affect, judgement and insight appear normal   Data Reviewed:  Notable labs -- K 3.2, bicarb 19, BUN 28, Cr 1.54 >> 1.43 >> 1.32, Ca 8.2, Hbg 9.2 stable Lipid panel normal  Family Communication: None present on rounds.  Disposition: Status is: Inpatient Remains inpatient appropriate because: d/c pending cardiology clearance, s/p cardioversion today, requires close monitoring    Planned Discharge Destination: Home with Home Health     Time spent: 42 minutes  Author: Burnard DELENA Cunning, DO 05/29/2024 2:38 PM  For on call review [email or web address].

## 2024-05-29 NOTE — Anesthesia Preprocedure Evaluation (Signed)
 Anesthesia Evaluation  Patient identified by MRN, date of birth, ID band Patient awake    Reviewed: Allergy & Precautions, H&P , NPO status , Patient's Chart, lab work & pertinent test results  History of Anesthesia Complications Negative for: history of anesthetic complications  Airway Mallampati: II  TM Distance: >3 FB Neck ROM: Full    Dental no notable dental hx. (+) Chipped,    Pulmonary COPD, former smoker   breath sounds clear to auscultation + wheezing      Cardiovascular hypertension, pulmonary hypertension(-) angina + CAD and + CABG  Normal cardiovascular exam+ dysrhythmias Atrial Fibrillation  Rhythm:Regular Rate:Normal  IMPRESSIONS     1. Left ventricular ejection fraction, by estimation, is 60 to 65%. The  left ventricle has normal function. The left ventricle has no regional  wall motion abnormalities. There is severe concentric left ventricular  hypertrophy of the apical segment,  consistent with apical HCM. Left ventricular diastolic function could not  be evaluated.   2. Right ventricular systolic function moderate to severly reduced. The  right ventricular size is mildly enlarged. There is normal pulmonary  artery systolic pressure. The estimated right ventricular systolic  pressure is 30.7 mmHg.   3. Left atrial size was severely dilated.   4. Right atrial size was moderately dilated.   5. The mitral valve is abnormal. Mild mitral valve regurgitation.   6. The aortic valve is tricuspid. Aortic valve regurgitation is not  visualized.   7. Aortic dilatation noted. There is mild dilatation of the ascending  aorta, measuring 42 mm.   8. The inferior vena cava is normal in size with <50% respiratory  variability, suggesting right atrial pressure of 8 mmHg.   Comparison(s): Changes from prior study are noted. 12/19/2020: LVEF 55%,  RVSP 59 mmHg, ascending aorta 44 mm.      Neuro/Psych  Neuromuscular disease  CVA  negative psych ROS   GI/Hepatic negative GI ROS, Neg liver ROS,,,  Endo/Other  negative endocrine ROS    Renal/GU Renal disease  negative genitourinary   Musculoskeletal negative musculoskeletal ROS (+)    Abdominal   Peds negative pediatric ROS (+)  Hematology  (+) Blood dyscrasia, anemia   Anesthesia Other Findings   Reproductive/Obstetrics negative OB ROS                              Anesthesia Physical Anesthesia Plan  ASA: 4  Anesthesia Plan: General   Post-op Pain Management:    Induction: Intravenous  PONV Risk Score and Plan: 3 and Treatment may vary due to age or medical condition  Airway Management Planned: Natural Airway and Simple Face Mask  Additional Equipment: None  Intra-op Plan:   Post-operative Plan: Extubation in OR  Informed Consent: I have reviewed the patients History and Physical, chart, labs and discussed the procedure including the risks, benefits and alternatives for the proposed anesthesia with the patient or authorized representative who has indicated his/her understanding and acceptance.     Dental advisory given  Plan Discussed with: CRNA  Anesthesia Plan Comments:          Anesthesia Quick Evaluation

## 2024-05-29 NOTE — Interval H&P Note (Signed)
 History and Physical Interval Note:  05/29/2024 11:44 AM  Kimberly [name]  has presented today for surgery, with the diagnosis of afib.  The various methods of treatment have been discussed with the patient and family. After consideration of risks, benefits and other options for treatment, the patient has consented to  Procedure(s): CARDIOVERSION (N/A) as a surgical intervention.  The patient's history has been reviewed, patient examined, no change in status, stable for surgery.  I have reviewed the patient's chart and labs.  Questions were answered to the patient's satisfaction.    NPO for DCCV. On eliquis  >3 weeks. No missed doses.   Signed, Darryle DASEN. Barbaraann, MD, [facility]  [facility]  [address] [place], [place] [address] [phone]  11:44 AM

## 2024-05-29 NOTE — Telephone Encounter (Signed)
 Pharmacy Patient Advocate Encounter  Insurance verification completed.    The patient is insured through [name]. Patient has Medicare and is not eligible for a copay card, but may be able to apply for patient assistance or Medicare RX Payment Plan (Patient Must reach out to their plan, if eligible for payment plan), if available.    Ran test claim for Kimberly [name] and the current 30 day co-pay is $0.00.  Ran test claim for Jardiance and the current 30 day co-pay is $0.00.  This test claim was processed through [facility]- copay amounts may vary at other pharmacies due to pharmacy/plan contracts, or as the patient moves through the different stages of their insurance plan.

## 2024-05-29 NOTE — Discharge Instructions (Signed)
 [employer] assistance programs Crisis assistance programs  -[facility]elessness Arts development officer. If you are experiencing homelessness in [place], [place], your first point of contact should be Pensions consultant. You can reach Coordinated Entry by calling (336) (586)072-5120 or by emailing [email or web address] [email or web address].  Community access points: [employer] [phone], [place]) every Tuesday from 9am-10am. [facility] ([address], [place]) every Wednesday from 8am-9am.   -[place] Coordinated Re-entry Marcy Panning: Dial 211 and request. Offers referrals to homeless shelters in the area.    -The [employer] 705-857-6775) offers several services to local families, as funding allows. The Emergency Assistance Program (EAP), which they administer, provides household goods, free food, clothing, and financial aid to people in need in the [employer] area. The EAP program does have some qualification, and counselors will interview clients for financial assistance by written referral only. Referrals need to be made by the Department of Social Services or by other EAP approved human services agencies or charities in the area.  -Open [facility] of [employer], which can be reached at 250-030-7943, offers emergency assistance programs for those in need of help, such as food, rent assistance, a soup kitchen, shelter, and clothing. They are based in [facility] but provide a number of services to those that qualify for assistance.   [facility] Department of Social Services may be able to offer temporary financial assistance and cash grants for paying rent and utilities, Help may be provided for local county residents who may be experiencing personal crisis when other resources, including government programs, are not available. Call [phone]  -High [employer] is a [employer] agency, The organization can offer emergency assistance for paying rent, Care[employer] Rx, utilities, food, household products and furniture. They offer extensive emergency and transitional housing for families, children and single women, and also run a Boy's and [employer]. Thrift Shops, Secondary school teacher, and other aid offered too. [address] [place], [place] [address], [phone]  -[place] Low Income Energy Assistance Program -- This is offered for [facility] families. The federal government created [employer] Program provides a one-time cash grant payment to help eligible low-income families pay their electric and heating bills. [address] [place], [place] [address], ([phone]  -[place] Emergency Assistance -- A program offers emergency utility and rent funds for greater [employer] area residents. The program can also provide counseling and referrals to charities and government programs. Also provides food and a free meal program that serves lunch Mondays - Saturdays and dinner seven days per week to individuals in the community. [address], [place], [place] [address], [phone]  -[employer] - Offers affordable apartment and housing communities across      [place] and [place]. The low income and seniors can access public housing, rental assistance to qualified applicants, and apply for the section 8 rent subsidy program. Other programs include Chiropractor and Engineer, maintenance. [address] [place], [place] [address], dial [phone].  -The [facility] provides transitional housing to veterans and the disabled. Clients will also access other services too, including assistance in applying for Disability, life skills classes, case management, and assistance in finding permanent housing. [address] [place], [place]  [place] [address], call [phone]  -[place] Transitional Housing through [facility] is for people who were just  evicted or that are formerly homeless. The non-profit will also help then gain self-sufficiency, find a home or apartment to live in, and also provides information on rent assistance when needed. Phone [phone]  -The [place] [employer] [facility] helps low income, elderly, or disabled residents in seven counties in the [place] ([place], [place], [place], [place], [place], [place], [place], and [place]) save energy and reduce their utility bills by improving energy efficiency. Phone [phone].  -[employer] is located in the [place] [facility] in the [employer], [address], [place], [place] [address]. Parking is in the rear of the building. Phone: [phone]   General Email: info@gsohc .org  GHC provides free housing counseling assistance in locating affordable rental housing or housing with support services for families and individuals in crisis and the chronically homeless. We provide potential resources for other housing needs like utilities. Our trained counselors also work with clients on budgeting and financial literacy in effort to empower them to take control of their financial situations. [employer] collaborates with homeless service providers and other stakeholders as part of the [employer] COC (Continuum of Care). The (COC) is a regional/local planning body that coordinates housing and services funding for homeless families and individuals. The role of [facility] in the COC is through housing counseling to work with people we serve on diversion strategies for those that are at imminent risk of becoming homeless. We also work with the Coordinated Assessment/Entry Specialist who attempts to find temporary solutions and/or connects the people  to Housing First, Rapid Re-housing or transitional housing programs. Our Homelessness Prevention Housing Counselors meet with clients on business days (Monday-Fridays, except scheduled holidays) from 8:30 am to 4:30 pm.  Legal assistance for evictions, foreclosure, and more -If you need free legal advice on civil issues, such as foreclosures, evictions, Electronics engineer, government programs, domestic issues and more, Armed forces operational officer Aid of [facility]) is a Associate Professor firm that provides free legal services and counsel to lower income people, seniors, disabled, and others, The goal is to ensure everyone has access to justice and fair representation. Call them at [phone].  [facility] for Housing and Community Studies can provide info about obtaining legal assistance with evictions. Phone [phone].  Data processing manager  The [employer], [employer]. offers job and Dispensing optician. Resources are focused on helping students obtain the skills and experiences that are necessary to compete in today's challenging and tight job market. The non-profit faith-based community action agency offers internship trainings as well as classroom instruction. Classes are tailored to meet the needs of people in the [facility] region. [place], [place] [address], [phone]  Foreclosure prevention/Debt Services Family Services of the [employer] Credit Counseling Service inludes debt and foreclosure prevention programs for local families. This includes money management, financial advice, budget review and development of a written action plan with a Pensions consultant to help solve specific individual financial problems. In addition, housing and mortgage counselors can also provide pre- and post-purchase homeownership counseling, default resolution counseling (to prevent foreclosure) and reverse mortgage counseling. A Debt Management Program allows  people and families with a high level of credit card or medical debt to consolidate and repay consumer debt and loans to creditors and rebuild positive credit ratings and scores. Contact (336) Q4373065.  Community clinics in [place] -Health Department [facility]: [address] [place], [place], [address]. [phone].  -Health Department [place] [facility]: [phone], [facility] [place], [address]. [phone].  -[facility] offers medical care through a group of doctors, pharmacies and other healthcare related agencies that offer services for low income, uninsured adults in [place]. Also offers adult Dental care and assistance with applying for an [employer]. Call [phone].   [facility]. This center provides low-cost health care to those without health insurance. Services offered include an onsite pharmacy. Phone [phone]. [address], [address], [place].  -Medication Assistance Program serves as a link between pharmaceutical companies and patients to provide low cost or free prescription medications. This service is available for residents who meet certain income restrictions and have no insurance coverage. PLEASE CALL [phone] Ginette Otto) OR [phone] ([place])  -One Step Further: Materials engineer, The [employer] Support & Nutrition Program, [employer]. Call [phone]/ [phone].  Food pantry and assistance -[facility]-Food Bank: [address], [place] [address]. Phone [phone]  -[facility] Food Pantry: [address] [place], [place] [address]. [phone].  -Missionary Ministry: has the purpose of visiting the sick and shut-ins and provide for needs in the surrounding communities. Call [phone]. Email: [email or web address] .com This program provides: Food box for seniors, Financial assistance, Food to meet basic  nutritional needs.  -Meals on Wheels with Senior Resources: [facility] residents age 68 and over who are homebound and unable to obtain and prepare a nutritious meal for themselves are eligible for this service. There may be a waiting list in certain parts of [facility] if the route in that area is full. If you are in [facility] and [place] call [phone] to register. For all other areas call [phone] to register.  -Greater Dietitian: https://findfood.BargainContractor.[email or web address]  TRANSPORTATION: -[employer] Department of Health: Call [facility] and [employer] at [phone] for details. AttractionGuides.es  -Access [place]: Access [place] is the [employer] Agency's shared-ride transportation service for eligible riders who have a disability that prevents them from riding the fixed route bus. Call ([phone]. Access [place] riders must pay a fare of $1.50 per trip, or may purchase a 10-ride punch card for $14.00 ($1.40 per ride) or a 40-ride punch card for $48.00 ($1.20 per ride).  -The [place]'s WHEELS rideshare transportation service is provided for senior citizens (60+) who live independently within [place] city limits and are unable to drive or have limited access to transportation. Call [phone] to schedule an appointment.  -[place] Transportation: For Medicare or Medicaid recipients call [phone]?[name], wheelchair Zenaida Niece, and ambulatory quotes available.   FLEEING VIOLENCE: -Family Services of the [place]- 24/7 Crisis line (520)[phone]) -[facility]: (336) 641-SAFE [phone])  [place] 2-1-1 is another useful way to locate resources in the community. Visit ShedSizes.ch to find service information online. If you need additional assistance, 2-1-1 Referral Specialists are available 24 hours a day, every day by dialing  2-1-1 or [phone] from any phone. The call is free, confidential, and available in any language.  Affordable Housing Search http://www.nchousingsearch.org  [facility])   M-F 8a-3p [address] [place], [place] [address] [phone] Services include: laundry, barbering, support groups, case management, phone & computer access, showers, AA/NA mtgs, mental health/substance abuse nurse, job skills class, disability information, VA assistance, spiritual classes, etc. Winter Shelter available when temperatures are less than 32 degrees.   HOMELESS SHELTERS [facility] at [facility]- Call [phone] ext. [phone]  or ext. 336. Located at [address], [place], [place] [address]  [facility] Mens Shelter- Call [phone]. Located at [address] [place] [address].  [facility]- [employer]. Call [phone]. Office located at [address] [place] [address].  [facility] Family Housing through [place] [phone].  [facility]- Call [phone]. Located at [address] [place], [place], [place] [address].  [facility]-For Pregnant mothers. Call [phone]. Located at [address] [place], [address].  [facility]-For men in [place]. Call [phone]. [facility]-Shelter in [place]. Call [phone].  Home of [employer] for [employer] 7315740023. Office located at [address] [place], [address].  [employer] be agreeable to help with chores. Call [phone] ext. 5000.  Men's: [address] [place],  [address]. Women's: [facility]  [address] [place], [place] [address]  Crisis Services [facility] Mobile Crisis Management- [phone]  [facility] [address], [place], [place] [address]. Phone: [phone]

## 2024-05-30 DIAGNOSIS — I4892 Unspecified atrial flutter: Secondary | ICD-10-CM

## 2024-05-30 DIAGNOSIS — I4891 Unspecified atrial fibrillation: Secondary | ICD-10-CM | POA: Diagnosis not present

## 2024-05-30 DIAGNOSIS — I422 Other hypertrophic cardiomyopathy: Secondary | ICD-10-CM | POA: Diagnosis not present

## 2024-05-30 DIAGNOSIS — N179 Acute kidney failure, unspecified: Secondary | ICD-10-CM | POA: Diagnosis not present

## 2024-05-30 DIAGNOSIS — I5022 Chronic systolic (congestive) heart failure: Secondary | ICD-10-CM | POA: Diagnosis not present

## 2024-05-30 LAB — BASIC METABOLIC PANEL WITH GFR
Anion gap: 6 (ref 5–15)
BUN: 19 mg/dL (ref 8–23)
CO2: 21 mmol/L — ABNORMAL LOW (ref 22–32)
Calcium: 8.5 mg/dL — ABNORMAL LOW (ref 8.9–10.3)
Chloride: 113 mmol/L — ABNORMAL HIGH (ref 98–111)
Creatinine, Ser: 1.12 mg/dL — ABNORMAL HIGH (ref 0.44–1.00)
GFR, Estimated: 54 mL/min — ABNORMAL LOW (ref >60)
Glucose, Bld: 79 mg/dL (ref 70–99)
Potassium: 3.5 mmol/L (ref 3.5–5.1)
Sodium: 140 mmol/L (ref 135–145)

## 2024-05-30 LAB — CBC
HCT: 31.6 % — ABNORMAL LOW (ref 36.0–46.0)
Hemoglobin: 9.7 g/dL — ABNORMAL LOW (ref 12.0–15.0)
MCH: 22.4 pg — ABNORMAL LOW (ref 26.0–34.0)
MCHC: 30.7 g/dL (ref 30.0–36.0)
MCV: 73 fL — ABNORMAL LOW (ref 80.0–100.0)
Platelets: 165 K/uL (ref 150–400)
RBC: 4.33 MIL/uL (ref 3.87–5.11)
RDW: 17.2 % — ABNORMAL HIGH (ref 11.5–15.5)
WBC: 6.3 K/uL (ref 4.0–10.5)
nRBC: 0 % (ref 0.0–0.2)

## 2024-05-30 LAB — MAGNESIUM: Magnesium: 2 mg/dL (ref 1.7–2.4)

## 2024-05-30 MED ORDER — CARVEDILOL 12.5 MG PO TABS
12.5000 mg | ORAL_TABLET | Freq: Two times a day (BID) | ORAL | Status: DC
  Administered 2024-05-30 – 2024-06-04 (×11): 12.5 mg via ORAL
  Filled 2024-05-30 (×11): qty 1

## 2024-05-30 MED ORDER — PROCHLORPERAZINE MALEATE 10 MG PO TABS
10.0000 mg | ORAL_TABLET | Freq: Four times a day (QID) | ORAL | Status: DC | PRN
  Administered 2024-06-01 – 2024-06-02 (×2): 10 mg via ORAL
  Filled 2024-05-30 (×4): qty 1

## 2024-05-30 MED ORDER — HYDRALAZINE HCL 25 MG PO TABS
25.0000 mg | ORAL_TABLET | Freq: Three times a day (TID) | ORAL | Status: DC
  Administered 2024-05-30 – 2024-06-04 (×15): 25 mg via ORAL
  Filled 2024-05-30 (×15): qty 1

## 2024-05-30 MED ORDER — PROCHLORPERAZINE EDISYLATE 10 MG/2ML IJ SOLN
10.0000 mg | Freq: Four times a day (QID) | INTRAMUSCULAR | Status: DC | PRN
  Administered 2024-05-30: 10 mg via INTRAVENOUS
  Filled 2024-05-30: qty 2

## 2024-05-30 MED ORDER — ISOSORBIDE MONONITRATE ER 30 MG PO TB24
30.0000 mg | ORAL_TABLET | Freq: Every day | ORAL | Status: DC
  Administered 2024-05-30 – 2024-06-04 (×6): 30 mg via ORAL
  Filled 2024-05-30 (×6): qty 1

## 2024-05-30 NOTE — Progress Notes (Signed)
 Notified Dr. Fausto that patient is refusing an IV at this time.

## 2024-05-30 NOTE — Plan of Care (Signed)

## 2024-05-30 NOTE — Progress Notes (Signed)
 Progress Note   Patient: Kimberly [name] FMW:[id number] DOB: Jul 17, 1956 DOA: 05/27/2024     2 DOS: the patient was seen and examined on 05/30/2024   Brief hospital course: Kimberly [name] is a 68 y.o. 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  She was found to be in A-fib RVR with HR in the 130's.  This was initially treated with IV push metoprolol  with some transient improvement.  Pt was admitted to Hospitalist service with Cardiology consulted for further evaluation and management as outlined in detail below.   10/15: Pt continued to have palpitations, chest pressure.  Cardiology saw pt. Started on amiodarone drip.    Assessment and Plan:  A-fib with RVR -- initially HR's in 130's Underwent successful cardioversion this AM HR in 50's on monitor during rounds post-procedure. Off amiodarone drip. --Cardiology following --Transitioned to PO amiodarone 200 mg BID x 2 weeks, then 200 mg daily --Telemetry monitoring --Replace K and Mg as needed --Monitor Qtc closely on amio --Continue Eliquis  5 mg BID --Coreg  reduced 25 >> 12.5 mg BID due to bradycardia post-cardioversion  Elevated troponin -- trend 354 >> 407 >> 437 >> 501 >> 616 >> 478>> 450.  No acute ischemic EKG changes.  Suspect Demand Ischemia in setting of A-fib with RVR --Cardiology following --Needs ischemic evaluation when renal function is improved   AKI - Cr 1.54 on admission >> 1.32 >> 1.12.  CK 580 (improved from recent rhabdo) --Avoid nephrotoxins & hypotension --Given gentle IV fluids on admission --Repeat BMP in AM --Further gentle hydration as needed  Hypokalemia - Resolved with replacement --Monitor BMP, Mg  CAD  s/p CABG 2008 --As above --Repeat EKG and troponin if chest pain --On Eliquis , not on antiplatelet therapy --Off Lipitor  due to recent rhabdomyolysis  Hyperlipidemia --Off Lipitor  due to recent rhabdomyolysis --Follow up repeat lipid profile  Chronic systolic CHF Moderate to Severe MR Apical Hypertrophic Cardiomyopathy Appears euvolemic, compensated. --Mgmt per Cardiology --On Coreg  as above --ARB on hold for renal function --Poor candidate for SGLT2i due to bed sore --Allergic to spironolonactone  Hypertension --Meds as above  Thoracic aortic aneurysm -- 4.4 cm ascending -- stable from prior imaging --Repeat CT in 1 year  Abdominal aortic aneurysm -- infra-renal 3.0 cm, unchanged from prior imaging --U/S in 3 years  Bilateral carotid artery stenosis --  --Medical mgmt as above  Hx of CVA - no acute changes --On Eliquis .  Off statin as above --Monitor neurologic status  Chronic Anemia - Hbg stable --Monitor CBC  Tobacco use - counseled on cessation  Pressure injury of skin -- POA --Wound care RN consulted --Continue wound care, off-loading pressures areas, closely monitor        Subjective: Pt was sleeping but woke to voice this AM on rounds. She reports feeling okay, tired. No other acute complaints.  Asked if she wants to pursue rehab again as PT recommended, she reluctantly agrees.     Physical Exam: Vitals:   05/30/24 1039 05/30/24 1152 05/30/24 1622 05/30/24 1624  BP:  (!) 135/97 (!) 130/98   Pulse: 89 89 87 90  Resp: 19 (!) 21 18   Temp:  (!) 97.4 F (36.3 C) 98.2 F (36.8 C)   TempSrc:  Oral Oral   SpO2: 94% 100% 99% 100%  Weight:      Height:       General exam: sleeping comfortably, wakes to voice, no acute distress HEENT:moist mucus membranes, hearing grossly normal  Respiratory system: CTAB, no rhonchi, normal respiratory effort. Cardiovascular system: normal S1/S2, RRR, no pedal edema.   Gastrointestinal system: soft, NT, ND Central  nervous system: A&O x 3. no gross focal neurologic deficits, normal speech Extremities: moves all, no edema, normal tone Skin: dry, intact, normal temperature Psychiatry: normal mood, congruent affect   Data Reviewed:  Notable labs -- Cl 113, bicarb 21, Cr improved 1.12, Ca 8.5, Hbg improved to 9.7 Lipid panel normal  Family Communication: None present on rounds. Will attempt to call as time allows.  Disposition: Status is: Inpatient Remains inpatient appropriate because: d/c pending cardiology clearance, monitoring post cardioversion, needs SNF placement   Planned Discharge Destination: SNF vs HH     Time spent: 42 minutes  Author: Burnard Kimberly Cunning, DO 05/30/2024 6:10 PM  For on call review [email or web address].

## 2024-05-30 NOTE — TOC Progression Note (Signed)
 Transition of Care [facility]) - Progression Note    Patient Details  Name: Kimberly [name] MRN: [id number] Date of Birth: October 04, 1955  Transition of Care [facility]) CM/SW Contact  Luise JAYSON Pan, [place] Phone Number: 05/30/2024, 11:42 AM  Clinical Narrative:   CSW informed patients daughter, [name] of outstanding balance with [facility] and that facility rescinded the bed offer. [name] stated okay. CSW asked if [name] would like to review other bed offers, she stated yes. CSW emailed bed offers to [employer] [facility] [email or web address].   CSW will continue to follow.    Expected Discharge Plan: Skilled Nursing Facility Barriers to Discharge: Continued Medical Work up, SNF Pending bed offer, Insurance Authorization               Expected Discharge Plan and Services In-house Referral: Clinical Social Work     Living arrangements for the past 2 months: Single Family Home                                       Social Drivers of Health (SDOH) Interventions SDOH Screenings   Food Insecurity: No Food Insecurity (05/28/2024)  Housing: High Risk (05/28/2024)  Transportation Needs: No Transportation Needs (05/28/2024)  Utilities: At Risk (05/28/2024)  Alcohol Screen: Low Risk  (09/21/2023)  Depression (PHQ2-9): Low Risk  (04/02/2024)  Financial Resource Strain: Low Risk  (09/21/2023)  Physical Activity: Unknown (09/21/2023)  Social Connections: Socially Isolated (05/28/2024)  Stress: No Stress Concern Present (09/21/2023)  Tobacco Use: Medium Risk (05/29/2024)  Health Literacy: Inadequate Health Literacy (09/21/2023)    Readmission Risk Interventions     No data to display

## 2024-05-30 NOTE — Progress Notes (Signed)
 VAT consulted for PIV.  PIV inserted in RFA flushed, blood return noted, patent. Pt stated it hurt when flushed and asked me to remove it. PIV was  removed pt refused a new stick. Care RN to place new consult when pt is ready.  Consult complete

## 2024-05-30 NOTE — Progress Notes (Signed)
 Progress Note  Patient Name: Kimberly [name] Date of Encounter: 05/30/2024 [facility] Cardiologist: Lynwood Schilling, MD   Interval Summary   Soft BP yesterday, now BP elevated to 157/104.  Cr improved to 1.1.  Reports dyspnea improved  Vital Signs Vitals:   05/29/24 2336 05/30/24 0405 05/30/24 0805 05/30/24 0814  BP: (!) 124/93 (!) 152/95 (!) 157/104   Pulse: 85 97 90 88  Resp:  17 18 10   Temp: 98.5 F (36.9 C) 97.6 F (36.4 C) 97.6 F (36.4 C)   TempSrc: Oral Oral Oral   SpO2: 99% 97% 100% 97%  Weight:      Height:        Intake/Output Summary (Last 24 hours) at 05/30/2024 0912 Last data filed at 05/30/2024 0805 Gross per 24 hour  Intake 370 ml  Output 1450 ml  Net -1080 ml      05/28/2024    3:53 PM 05/28/2024   12:22 AM 04/25/2024    4:25 PM  Last 3 Weights  Weight (lbs) 139 lb 12.4 oz 125 lb 125 lb  Weight (kg) 63.4 kg 56.7 kg 56.7 kg      Telemetry/ECG  AFL conversion at 0300 HR 94- Personally Reviewed  Physical Exam  GEN: No acute distress.   Neck: + JVD Cardiac: RRR, no murmurs, rubs, or gallops. Radial pulses 2+ bilaterally Respiratory: Clear to auscultation bilaterally. GI: Tight, though compressible, tenderness to RUQ MS: No edema  Assessment & Plan  Kimberly [name] is a 68 y.o. female with a hx of paroxysmal atrial fibrillation on eliquis , coronary artery disease s/p CABG in 2008, apical variant HCM, congestive heart failure, CVA in 2019, HTN, hyperlipidemia, and tobacco use who presented to the ED on 10/14 for chest/back pain, urinary frequency and shortness of breath. BP:121/100 and in AF RVR  [HR 126]. She was given pain medication, IV fluids, IV lopressor  x2 and oral lopressor , and electrolyte repletion. Troponin elevation noted. Cardiology consulted.    Atrial Fibrillation with RVR/ Atrial Flutter: has longstanding persistent Afib, presented with Afib with RVR.  Reports compliance with her Eliquis .  Started on amio gtt and  converted to rate controlled atrial flutter.  Underwent successful cardioversion on 10/16 but only maintained sinus rhythm ~4 hours.  Now back in AFL, rate 90s -Continue Eliquis  5 mg BID -Continue carvedilol , dose was reduced to 12.5 mg BID given bradycardia after cardioversion -Continue amiodarone 200 mg BID x 2 weeks then decrease to 200 mg daily.  Can consider repeat cardioversion after amio load as outpatient; do not need to reattempt inpatient cardioversion unless having issues with rate control  Demand Ischemia: presented with troponin elevation up to 616.  Reported chest pain that was sharp and reproducible with palpation, suspect MSK pain.  Suspected demand ischemia in setting of Afib with RVR  Chronic systolic heart failure: echo with EF 45-50%, moderate to severe MR.  Has known apical HCM.   -Unclear cause of cardiomyopahty.  Could be related to Afib with RVR vs ischemic cardiomyopathy.  Attempting to restore sinus rhythm as above.  If EF remains reduced following this would need ischemic eval.  Would also consider cardiac MRI for work-up of cardiomyopathy and to quantify MR, can be done as outpatient -Continue coreg  -Entresto  on hold given AKI, can restart as resolves -Allergy to spironolactone  -Defer SGLT2i, patient having issues with bed sore -Add hydralazine /imdur  given elevated BP  CAD s/p CABG in 2008: Myoview  in 2021 that showed infarct in the basal inferolateral, basal  anterolateral, and mid inferolateral, mid anterolateral, and apical lateral location. No further work-up was pursued as it was not deemed a high risk ischemic area. Most recent echocardiogram 04/2024 showed RWMA that aligns with infarct above. Ischemic evaluation was not pursued at that time given her AKI. Can consider as outaptient as above if EF not improving with restoration of sinus rhythm -Continue Eliquis  -Statin on hold given elevated CK on admission   AKI: baseline Cr 0.8, was 1.5 on admission.  Improved  with fluids, currently 1.1    Signed, Lonni LITTIE Nanas, MD  05/30/2024 9:12 AM     For questions or updates, please contact [facility] Please consult [email or web address] for contact info under       Signed, Lonni LITTIE Nanas, MD

## 2024-05-31 DIAGNOSIS — I4891 Unspecified atrial fibrillation: Secondary | ICD-10-CM | POA: Diagnosis not present

## 2024-05-31 LAB — BASIC METABOLIC PANEL WITH GFR
Anion gap: 13 (ref 5–15)
BUN: 14 mg/dL (ref 8–23)
CO2: 16 mmol/L — ABNORMAL LOW (ref 22–32)
Calcium: 8.5 mg/dL — ABNORMAL LOW (ref 8.9–10.3)
Chloride: 114 mmol/L — ABNORMAL HIGH (ref 98–111)
Creatinine, Ser: 0.95 mg/dL (ref 0.44–1.00)
GFR, Estimated: >60 mL/min (ref >60)
Glucose, Bld: 79 mg/dL (ref 70–99)
Potassium: 4.5 mmol/L (ref 3.5–5.1)
Sodium: 143 mmol/L (ref 135–145)

## 2024-05-31 MED ORDER — FUROSEMIDE 10 MG/ML IJ SOLN
40.0000 mg | Freq: Once | INTRAMUSCULAR | Status: DC
  Filled 2024-05-31: qty 4

## 2024-05-31 MED ORDER — ASPIRIN 81 MG PO TBEC
81.0000 mg | DELAYED_RELEASE_TABLET | Freq: Every day | ORAL | Status: DC
  Administered 2024-05-31 – 2024-06-04 (×5): 81 mg via ORAL
  Filled 2024-05-31 (×5): qty 1

## 2024-05-31 MED ORDER — SACUBITRIL-VALSARTAN 24-26 MG PO TABS
1.0000 | ORAL_TABLET | Freq: Two times a day (BID) | ORAL | Status: DC
  Administered 2024-05-31 – 2024-06-04 (×9): 1 via ORAL
  Filled 2024-05-31 (×9): qty 1

## 2024-05-31 MED ORDER — FUROSEMIDE 10 MG/ML IJ SOLN
40.0000 mg | Freq: Once | INTRAMUSCULAR | Status: AC
  Administered 2024-05-31: 40 mg via INTRAVENOUS
  Filled 2024-05-31: qty 4

## 2024-05-31 MED ORDER — HYDRALAZINE HCL 20 MG/ML IJ SOLN: 10.0000 mg | Freq: Three times a day (TID) | INTRAMUSCULAR | Status: DC | PRN

## 2024-05-31 NOTE — Progress Notes (Signed)
 PROGRESS NOTE  Kimberly [name]  DOB: [birth year]  PCP: Georgina Speaks, FNP FMW:[id number]  DOA: 05/27/2024  LOS: 3 days  Hospital Day: 5  Subjective: Patient was seen and examined this morning. Elderly African-American female.  Lying on bed.  Alert, awake, slow to respond but able to answer questions. Not in distress.  Family not at bedside. Afebrile, blood pressure mostly elevated up to 150s, on 2 L oxygen nasal cannula Labs this morning with creatinine better at 0.95  Brief narrative: Kimberly [name] is a 68 y.o. female with PMH significant for HTN, HLD, prediabetes, CAD/CABG, HOCM, A-fib on Eliquis  CVA with residual left-sided weakness, wheelchair-bound status, anemia, COPD gout, osteoporosis. 10/14, patient presented to the ED with complaint of chest/back pain, urinary frequency, shortness of breath, palpitation. Found to be in A-fib with RVR with heart rate in the 130s Heart rate transiently improved with IV metoprolol  Admitted to [facility] Cardiology was consulted  Assessment and plan: A-fib/A-flutter with RVR  h/o persistent A-fib Cardiology consulted.  Started on amiodarone drip 10/16, underwent successful cardioversion but maintained normal sinus rhythm only for about 4 hours and back to a flutter Per cardiology recommendation, currently on carvedilol  12.5 mg twice daily,  amiodarone 200 mg for 2 weeks to be followed by 20 mg daily. Tentative plan to repeat cardioversion after Amio load as outpatient. Continue chronic anticoagulation with Eliquis    Chronic systolic CHF Moderate to Severe MR Apical Hypertrophic Cardiomyopathy HTN Echo with EF 45 to 50%. Appears euvolemic, compensated. Per cardiology, unclear cause of cardiomyopathy.  Ischemia versus tachycardia mediated.  May need ischemic evaluation versus cardiac MRI if no improvement in EF in next few weeks. Currently on Coreg  12.5 mg twice daily, hydralazine  25 mg 3 times daily, Imdur  30 mg daily Cardiology gave 1  dose of IV Lasix  today and also started on Entresto  24-26 mg twice daily Allergic to Aldactone  Poor candidate for SGLT2i due to bed sore  Elevated troponin  H/o CAD/CABG  Troponin trended up to peak at 616, trended down  No acute ischemic EKG changes.   Suspect demand ischemia in setting of A-fib with RVR Ischemia evaluation as an outpatient Continue aspirin , Eliquis .  Statin plan as below Recent Labs    05/28/24 1823  TROPONINIHS 450*   HLD Off Lipitor  due to recent rhabdomyolysis CK level still remains elevated. Recent Labs  Lab 05/28/24 0056  CKTOTAL 580*    AKI Acute metabolic acidosis Baseline creatinine normal.  Patient with creatinine elevated to 1.54 With IV hydration, creatinine improved down to normal. However, serum bicarb level seems to be downtrending, lowest at 16 today Currently not on IV fluid.  Continue to monitor creatinine and bicarb level.  Recent Labs    04/27/24 1252 04/28/24 1124 04/29/24 0835 04/30/24 0821 05/01/24 0649 05/27/24 2045 05/28/24 0530 05/29/24 0255 05/30/24 0302 05/31/24 0243  BUN 23 17  16 16 12 10  34* 33* 28* 19 14  CREATININE 1.00 0.89  0.88 0.78 0.89 0.76 1.54* 1.43* 1.32* 1.12* 0.95  CO2 20* 24  24 22 23 27  19* 19* 19* 21* 16*   Hypokalemia  Resolved with replacement Recent Labs  Lab 05/27/24 2045 05/28/24 0530 05/29/24 0255 05/30/24 0302 05/31/24 0243  K 3.4* 3.9 3.2* 3.5 4.5  MG 1.8 2.3 2.2 2.0  --   PHOS  --  3.4  --   --   --    Thoracic aortic aneurysm  Thoracic aortic aneurysm size is stable at 4.4 cm.   Follow-up as  an outpatient for repeat CT in a year   Abdominal aortic aneurysm  infra-renal 3.0 cm, unchanged from prior imaging U/S in 3 years  Bilateral carotid artery stenosis  Medical management as above  Hx of CVA  Has residual left-sided weakness.   No acute changes Continue Eliquis    Chronic microcytic anemia  Hemoglobin stable above 9 Continue to monitor Recent Labs     05/01/24 0649 05/27/24 2045 05/28/24 0530 05/29/24 0255 05/30/24 0302  HGB 11.0* 10.4* 9.0* 9.2* 9.7*  MCV 71.6* 72.7* 72.1* 72.7* 73.0*   Tobacco use  counseled on cessation   Pressure injury of skin -- POA Wound care RN consulted Continue wound care, off-loading pressures areas, closely monitor   Impaired mobility Likely wheelchair-bound status Seen by PT.  SNF recommended   Mobility:  PT Orders: Active   PT Follow up Rec: Skilled Nursing-Short Term Rehab (<3 Hours/Day)05/28/2024 0900   Goals of care   Code Status: Full Code     DVT prophylaxis:   apixaban  (ELIQUIS ) tablet 5 mg   Antimicrobials: None Fluid: None currently Consultants: Cardiology Family Communication: None at bedside  Status: Inpatient Level of care:  Progressive   Patient is from: Home Needs to continue in-hospital care: Ongoing diuresis Anticipated d/c to: PT recommended SNF      Diet:  Diet Order             Diet Heart Room service appropriate? Yes; Fluid consistency: Thin  Diet effective now                   Scheduled Meds:  amiodarone  200 mg Oral BID   apixaban   5 mg Oral BID   aspirin  EC  81 mg Oral Daily   bisacodyl   10 mg Rectal Once   carvedilol   12.5 mg Oral BID WC   docusate sodium   100 mg Oral BID   hydrALAZINE   25 mg Oral Q8H   isosorbide  mononitrate  30 mg Oral Daily   mirtazapine   15 mg Oral QHS   pantoprazole   40 mg Oral Daily   sacubitril -valsartan   1 tablet Oral BID    PRN meds: acetaminophen  **OR** acetaminophen , HYDROcodone -acetaminophen , ipratropium-albuterol , polyethylene glycol, prochlorperazine   Infusions:    Antimicrobials: Anti-infectives (From admission, onward)    None       Objective: Vitals:   05/31/24 0715 05/31/24 1110  BP: (!) 124/99 (!) 141/125  Pulse: 88 89  Resp: 13 18  Temp: (!) 97.4 F (36.3 C) 98 F (36.7 C)  SpO2: 100% 99%    Intake/Output Summary (Last 24 hours) at 05/31/2024 1440 Last data filed at  05/31/2024 1300 Gross per 24 hour  Intake 600 ml  Output 300 ml  Net 300 ml   Filed Weights   05/28/24 0022 05/28/24 1553  Weight: 56.7 kg 63.4 kg   Weight change:  Body mass index is 21.89 kg/m.   Physical Exam: General exam: Pleasant, elderly African-American female Skin: No rashes, lesions or ulcers. HEENT: Atraumatic, normocephalic, no obvious bleeding Lungs: Clear to auscultation bilaterally,  CVS: S1, S2, no murmur,   GI/Abd: Soft, nontender, nondistended, bowel sound present,   CNS: Alert, awake, oriented to place and person, baseline weakness in the left side due to previous stroke Psychiatry: Sad affect Extremities: No pedal edema, no calf tenderness,   Data Review: I have personally reviewed the laboratory data and studies available.  F/u labs ordered [employer] (From admission, onward)     Start  Ordered   06/01/24 0500  Basic metabolic panel with GFR  Tomorrow morning,   R       Question:  Specimen collection method  Answer:  Lab=Lab collect   05/31/24 0852   06/01/24 0500  CBC with Differential/Platelet  Tomorrow morning,   R       Question:  Specimen collection method  Answer:  Lab=Lab collect   05/31/24 9147            Signed, Chapman Rota, MD [facility] 05/31/2024

## 2024-05-31 NOTE — Plan of Care (Signed)

## 2024-05-31 NOTE — Progress Notes (Signed)
 Progress Note  Patient Name: Kimberly [name] Date of Encounter: 05/31/2024  Primary Cardiologist: Lynwood Schilling, MD   Subjective   Dyspnea still present. C/o wheezing.   Inpatient Medications    Scheduled Meds:  amiodarone  200 mg Oral BID   apixaban   5 mg Oral BID   aspirin  EC  81 mg Oral Daily   bisacodyl   10 mg Rectal Once   carvedilol   12.5 mg Oral BID WC   docusate sodium   100 mg Oral BID   hydrALAZINE   25 mg Oral Q8H   isosorbide  mononitrate  30 mg Oral Daily   mirtazapine   15 mg Oral QHS   pantoprazole   40 mg Oral Daily   Continuous Infusions:  PRN Meds: acetaminophen  **OR** acetaminophen , HYDROcodone -acetaminophen , ipratropium-albuterol , polyethylene glycol, prochlorperazine   Vital Signs    Vitals:   05/30/24 2004 05/30/24 2345 05/31/24 0514 05/31/24 0715  BP: (!) 155/124 (!) 139/112 (!) 154/112 (!) 124/99  Pulse: 88 90  88  Resp: 18 10  13   Temp: (!) 97.4 F (36.3 C) (!) 97.4 F (36.3 C)  (!) 97.4 F (36.3 C)  TempSrc: Oral Oral  Oral  SpO2: 100% 100%  100%  Weight:      Height:        Intake/Output Summary (Last 24 hours) at 05/31/2024 1138 Last data filed at 05/31/2024 0841 Gross per 24 hour  Intake 480 ml  Output 300 ml  Net 180 ml   Filed Weights   05/28/24 0022 05/28/24 1553  Weight: 56.7 kg 63.4 kg    Telemetry    Atypical atrial flutter - Personally Reviewed  ECG    none - Personally Reviewed  Physical Exam   GEN: No acute distress.   Neck: No JVD Cardiac: IRRR, no murmurs, rubs, or gallops.  Respiratory: Clear to auscultation bilaterally. GI: Soft, nontender, non-distended  MS: No edema; No deformity. Neuro:  Nonfocal  Psych: Normal affect   Labs    Chemistry Recent Labs  Lab 05/28/24 0056 05/28/24 0530 05/29/24 0255 05/30/24 0302 05/31/24 0243  NA  --  136 137 140 143  K  --  3.9 3.2* 3.5 4.5  CL  --  107 109 113* 114*  CO2  --  19* 19* 21* 16*  GLUCOSE  --  79 90 79 79  BUN  --  33* 28* 19 14   CREATININE  --  1.43* 1.32* 1.12* 0.95  CALCIUM   --  8.6* 8.2* 8.5* 8.5*  PROT 5.9* 5.3*  --   --   --   ALBUMIN 3.4* 3.2*  --   --   --   AST 36 37  --   --   --   ALT 30 28  --   --   --   ALKPHOS 46 47  --   --   --   BILITOT 0.7 0.8  --   --   --   GFRNONAA  --  40* 44* 54* >60  ANIONGAP  --  10 9 6 13      Hematology Recent Labs  Lab 05/28/24 0530 05/29/24 0255 05/30/24 0302  WBC 8.7 5.9 6.3  RBC 3.90 4.03 4.33  HGB 9.0* 9.2* 9.7*  HCT 28.1* 29.3* 31.6*  MCV 72.1* 72.7* 73.0*  MCH 23.1* 22.8* 22.4*  MCHC 32.0 31.4 30.7  RDW 16.7* 16.7* 17.2*  PLT 154 152 165    Cardiac EnzymesNo results for input(s): TROPONINI in the last 168 hours. No results for  input(s): TROPIPOC in the last 168 hours.   BNP Recent Labs  Lab 05/28/24 0849  BNP 1,244.1*     DDimer  Recent Labs  Lab 05/28/24 0056  DDIMER 0.55*     Radiology    EP STUDY Result Date: 05/29/2024 See surgical note for result.   Cardiac Studies   none  Patient Profile     68 y.o. female admitted with PAF with RVR, and chronic systolic heart failure in the setting of chronic CAD.  Assessment & Plan    Acute systolic heart failure - I'll give more IV lasix . We will restart entresto .  Atrial fib/flutter - her rates are controlled. Continue coreg  and amio.     For questions or updates, please contact [facility] Please consult [email or web address] for contact info under Cardiology/STEMI.      Signed, Danelle Birmingham, MD  05/31/2024, 11:38 AM

## 2024-05-31 NOTE — TOC Progression Note (Signed)
 Transition of Care [facility]) - Progression Note    Patient Details  Name: DAGNY FIORENTINO MRN: [id number] Date of Birth: [birth year]  Transition of Care [facility] CM/SW Contact  Luise JAYSON Pan, [place] Phone Number: 05/31/2024, 2:29 PM  Clinical Narrative:  CSW spoke with [name] to follow up on SNF choice [facility]iki deferred communication to patients sister, [name].   CSW spoke with [name]. Per [name], she was told by a nurse that there would be a meeting with SW and MD. CSW informed [name] that CSW was not notified of a meeting. [name] stated her concerns with patient going to a SNF for rehab. Per [name], patient got a bed sore from the previous SNF she was admitted to. [name] stated she is in communication with [facility] about the balance patient owes as the balance is due to patient staying at the SNF over her covered days with insurance. Per [name], patient stayed over her days because the facility attempted to discharge patient without her medications and without explaining what they were for. Family is passionate about making sure patient is taken care of.   Family is unable to take patient home at this time due to family members being out of town. Per [name], family will always take patient home after rehab as they do not want to place patient somewhere long term. [name] stated patient had personal care services through [facility] but the evaluation date to reinstated services fell along the time of patient coming to the hospital. [name] was told hospitalist would need to help assist in reinstating services. CSW notified [name] that unit RNCM may be able to assist.   Plan per [name]: Still interested in SNF, will review offers with [name] (pts dtr), wants Personal care services with [facility] worked on and wants hours maximized.   CSW will continue to follow.    Expected Discharge Plan: Skilled Nursing Facility Barriers to Discharge: Continued Medical Work up, SNF Pending bed offer, Insurance Authorization                Expected Discharge Plan and Services In-house Referral: Clinical Social Work     Living arrangements for the past 2 months: Single Family Home                                       Social Drivers of Health (SDOH) Interventions SDOH Screenings   Food Insecurity: No Food Insecurity (05/28/2024)  Housing: High Risk (05/28/2024)  Transportation Needs: No Transportation Needs (05/28/2024)  Utilities: At Risk (05/28/2024)  Alcohol Screen: Low Risk  (09/21/2023)  Depression (PHQ2-9): Low Risk  (04/02/2024)  Financial Resource Strain: Low Risk  (09/21/2023)  Physical Activity: Unknown (09/21/2023)  Social Connections: Socially Isolated (05/28/2024)  Stress: No Stress Concern Present (09/21/2023)  Tobacco Use: Medium Risk (05/29/2024)  Health Literacy: Inadequate Health Literacy (09/21/2023)    Readmission Risk Interventions     No data to display

## 2024-06-01 DIAGNOSIS — I4891 Unspecified atrial fibrillation: Secondary | ICD-10-CM | POA: Diagnosis not present

## 2024-06-01 LAB — GLUCOSE, CAPILLARY: Glucose-Capillary: 95 mg/dL (ref 70–99)

## 2024-06-01 NOTE — Progress Notes (Signed)
 PROGRESS NOTE  Kimberly [name]  DOB: 1955-12-25  PCP: Georgina Speaks, FNP FMW:[id number]  DOA: 05/27/2024  LOS: 4 days  Hospital Day: 6  Subjective: Patient was seen and examined this morning. Lying on bed.  Not in distress.  Family not at bedside Afebrile, heart rate 80s and 90s, blood pressure up to 150s this morning  Brief narrative: Kimberly [name] is a 68 y.o. female with PMH significant for HTN, HLD, prediabetes, CAD/CABG, HOCM, A-fib on Eliquis  CVA with residual left-sided weakness, wheelchair-bound status, anemia, COPD gout, osteoporosis. 10/14, patient presented to the ED with complaint of chest/back pain, urinary frequency, shortness of breath, palpitation. Found to be in A-fib with RVR with heart rate in the 130s Heart rate transiently improved with IV metoprolol  Admitted to [facility] Cardiology was consulted  Assessment and plan: A-fib/A-flutter with RVR  h/o persistent A-fib Cardiology consulted.  Started on amiodarone drip 10/16, underwent successful cardioversion but maintained normal sinus rhythm only for about 4 hours and back to a flutter Per cardiology recommendation, currently on carvedilol  12.5 mg twice daily,  amiodarone 200 mg for 2 weeks to be followed by 20 mg daily. Tentative plan to repeat cardioversion after Amio load as outpatient. Continue chronic anticoagulation with Eliquis    Chronic systolic CHF Moderate to Severe MR Apical Hypertrophic Cardiomyopathy HTN Echo with EF 45 to 50%. Appears euvolemic, compensated. Per cardiology, unclear cause of cardiomyopathy.  Ischemia versus tachycardia mediated.  May need ischemic evaluation versus cardiac MRI if no improvement in EF in next few weeks. Currently on Coreg  12.5 mg twice daily, Entresto  24-26 mg twice daily, hydralazine  25 mg 3 times daily, Imdur  30 mg daily Given 1 dose of Lasix  yesterday.  Dyspnea is improved.  Not on scheduled diuretic. Allergic to Aldactone  Poor candidate for SGLT2i due to  bed sore  Elevated troponin  H/o CAD/CABG  Troponin trended up to peak at 616, trended down  No acute ischemic EKG changes.   Suspect demand ischemia in setting of A-fib with RVR Ischemia evaluation as an outpatient Continue aspirin , Eliquis .  Statin plan as below  HLD Off Lipitor  due to recent rhabdomyolysis Repeat CK level today. Recent Labs  Lab 05/28/24 0056  CKTOTAL 580*    AKI Acute metabolic acidosis Baseline creatinine normal.  Patient with creatinine elevated to 1.54 With IV hydration, creatinine improved down to normal. However, serum bicarb level seems to be downtrending, lowest at 16 yesterday.  Repeat labs tomorrow. Currently not on IV fluid.  Continue to monitor creatinine and bicarb level.  Recent Labs    04/27/24 1252 04/28/24 1124 04/29/24 0835 04/30/24 0821 05/01/24 0649 05/27/24 2045 05/28/24 0530 05/29/24 0255 05/30/24 0302 05/31/24 0243  BUN 23 17  16 16 12 10  34* 33* 28* 19 14  CREATININE 1.00 0.89  0.88 0.78 0.89 0.76 1.54* 1.43* 1.32* 1.12* 0.95  CO2 20* 24  24 22 23 27  19* 19* 19* 21* 16*   Hypokalemia  Resolved with replacement Recent Labs  Lab 05/27/24 2045 05/28/24 0530 05/29/24 0255 05/30/24 0302 05/31/24 0243  K 3.4* 3.9 3.2* 3.5 4.5  MG 1.8 2.3 2.2 2.0  --   PHOS  --  3.4  --   --   --    Thoracic aortic aneurysm  Thoracic aortic aneurysm size is stable at 4.4 cm.   Follow-up as an outpatient for repeat CT in a year   Abdominal aortic aneurysm  infra-renal 3.0 cm, unchanged from prior imaging U/S in 3 years  Bilateral carotid  artery stenosis  Medical management as above  Hx of CVA  Has residual left-sided weakness.   No acute changes Continue Eliquis    Chronic microcytic anemia  Hemoglobin stable above 9 Continue to monitor Recent Labs    05/01/24 0649 05/27/24 2045 05/28/24 0530 05/29/24 0255 05/30/24 0302  HGB 11.0* 10.4* 9.0* 9.2* 9.7*  MCV 71.6* 72.7* 72.1* 72.7* 73.0*   Tobacco use  counseled  on cessation   Pressure injury of skin -- POA Wound care RN consulted Continue wound care, off-loading pressures areas, closely monitor   Impaired mobility Likely wheelchair-bound status Seen by PT.  SNF recommended   Mobility:  PT Orders: Active   PT Follow up Rec: Skilled Nursing-Short Term Rehab (<3 Hours/Day)05/28/2024 0900   Goals of care   Code Status: Full Code     DVT prophylaxis:   apixaban  (ELIQUIS ) tablet 5 mg   Antimicrobials: None Fluid: None currently Consultants: Cardiology Family Communication: None at bedside  Status: Inpatient Level of care:  Progressive   Patient is from: Home Needs to continue in-hospital care: Ongoing titration of meds.  Repeat labs tomorrow. Anticipated d/c to: PT recommended SNF      Diet:  Diet Order             Diet Heart Room service appropriate? Yes; Fluid consistency: Thin  Diet effective now                   Scheduled Meds:  amiodarone  200 mg Oral BID   apixaban   5 mg Oral BID   aspirin  EC  81 mg Oral Daily   bisacodyl   10 mg Rectal Once   carvedilol   12.5 mg Oral BID WC   docusate sodium   100 mg Oral BID   hydrALAZINE   25 mg Oral Q8H   isosorbide  mononitrate  30 mg Oral Daily   mirtazapine   15 mg Oral QHS   pantoprazole   40 mg Oral Daily   sacubitril -valsartan   1 tablet Oral BID    PRN meds: acetaminophen  **OR** acetaminophen , hydrALAZINE , HYDROcodone -acetaminophen , ipratropium-albuterol , polyethylene glycol, prochlorperazine   Infusions:    Antimicrobials: Anti-infectives (From admission, onward)    None       Objective: Vitals:   06/01/24 0417 06/01/24 0733  BP: (!) 152/110   Pulse: 86   Resp: 15   Temp: 97.7 F (36.5 C) (P) 97.7 F (36.5 C)  SpO2: 99%     Intake/Output Summary (Last 24 hours) at 06/01/2024 1454 Last data filed at 06/01/2024 0110 Gross per 24 hour  Intake 553.29 ml  Output 2100 ml  Net -1546.71 ml   Filed Weights   05/28/24 0022 05/28/24 1553   Weight: 56.7 kg 63.4 kg   Weight change:  Body mass index is 21.89 kg/m.   Physical Exam: General exam: Pleasant, elderly African-American female.  Not in distress Skin: No rashes, lesions or ulcers. HEENT: Atraumatic, normocephalic, no obvious bleeding Lungs: Clear to auscultation bilaterally,  CVS: S1, S2, no murmur,   GI/Abd: Soft, nontender, nondistended, bowel sound present,   CNS: Alert, awake, oriented to place and person, baseline weakness in the left side due to previous stroke Psychiatry: Sad affect Extremities: No pedal edema, no calf tenderness,   Data Review: I have personally reviewed the laboratory data and studies available.  F/u labs ordered Unresulted Labs (From admission, onward)     Start     Ordered   06/02/24 0500  Basic metabolic panel with GFR  Tomorrow morning,   R  Question:  Specimen collection method  Answer:  Lab=Lab collect   06/01/24 1454   06/01/24 0500  CBC with Differential/Platelet  Tomorrow morning,   R       Question:  Specimen collection method  Answer:  Lab=Lab collect   05/31/24 9147            Signed, Chapman Rota, MD [facility] 06/01/2024

## 2024-06-01 NOTE — Plan of Care (Signed)

## 2024-06-01 NOTE — Progress Notes (Signed)
 Progress Note  Patient Name: Kimberly [name] Date of Encounter: 06/01/2024  Primary Cardiologist: Lynwood Schilling, MD   Subjective   Dyspnea improved. No chest pain.   Inpatient Medications    Scheduled Meds:  amiodarone  200 mg Oral BID   apixaban   5 mg Oral BID   aspirin  EC  81 mg Oral Daily   bisacodyl   10 mg Rectal Once   carvedilol   12.5 mg Oral BID WC   docusate sodium   100 mg Oral BID   hydrALAZINE   25 mg Oral Q8H   isosorbide  mononitrate  30 mg Oral Daily   mirtazapine   15 mg Oral QHS   pantoprazole   40 mg Oral Daily   sacubitril -valsartan   1 tablet Oral BID   Continuous Infusions:  PRN Meds: acetaminophen  **OR** acetaminophen , hydrALAZINE , HYDROcodone -acetaminophen , ipratropium-albuterol , polyethylene glycol, prochlorperazine   Vital Signs    Vitals:   06/01/24 0100 06/01/24 0400 06/01/24 0417 06/01/24 0733  BP: (!) 144/107  (!) 152/110   Pulse: 89 90 86   Resp: 10 11 15    Temp: (!) 97.4 F (36.3 C)  97.7 F (36.5 C) (P) 97.7 F (36.5 C)  TempSrc: Oral  Oral (P) Oral  SpO2: 100% 100% 99%   Weight:      Height:        Intake/Output Summary (Last 24 hours) at 06/01/2024 1000 Last data filed at 06/01/2024 0110 Gross per 24 hour  Intake 673.29 ml  Output 2100 ml  Net -1426.71 ml   Filed Weights   05/28/24 0022 05/28/24 1553  Weight: 56.7 kg 63.4 kg    Telemetry    Afib with a controlled VR 90-110/min - Personally Reviewed  ECG    none - Personally Reviewed  Physical Exam   GEN: No acute distress.   Neck: No JVD Cardiac: IRRR, no murmurs, rubs, or gallops.  Respiratory: Clear to auscultation bilaterally. GI: Soft, nontender, non-distended  MS: No edema; No deformity. Neuro:  Nonfocal  Psych: Normal affect   Labs    Chemistry Recent Labs  Lab 05/28/24 0056 05/28/24 0530 05/29/24 0255 05/30/24 0302 05/31/24 0243  NA  --  136 137 140 143  K  --  3.9 3.2* 3.5 4.5  CL  --  107 109 113* 114*  CO2  --  19* 19* 21* 16*   GLUCOSE  --  79 90 79 79  BUN  --  33* 28* 19 14  CREATININE  --  1.43* 1.32* 1.12* 0.95  CALCIUM   --  8.6* 8.2* 8.5* 8.5*  PROT 5.9* 5.3*  --   --   --   ALBUMIN 3.4* 3.2*  --   --   --   AST 36 37  --   --   --   ALT 30 28  --   --   --   ALKPHOS 46 47  --   --   --   BILITOT 0.7 0.8  --   --   --   GFRNONAA  --  40* 44* 54* >60  ANIONGAP  --  10 9 6 13      Hematology Recent Labs  Lab 05/28/24 0530 05/29/24 0255 05/30/24 0302  WBC 8.7 5.9 6.3  RBC 3.90 4.03 4.33  HGB 9.0* 9.2* 9.7*  HCT 28.1* 29.3* 31.6*  MCV 72.1* 72.7* 73.0*  MCH 23.1* 22.8* 22.4*  MCHC 32.0 31.4 30.7  RDW 16.7* 16.7* 17.2*  PLT 154 152 165    Cardiac EnzymesNo results for  input(s): TROPONINI in the last 168 hours. No results for input(s): TROPIPOC in the last 168 hours.   BNP Recent Labs  Lab 05/28/24 0849  BNP 1,244.1*     DDimer  Recent Labs  Lab 05/28/24 0056  DDIMER 0.55*     Radiology    No results found.  Cardiac Studies   none  Patient Profile     68 y.o. female admitted with chronic systolic heart failure in the setting of CAD.   Assessment & Plan    Acute on chronic systolic heart failure - her dyspnea is improved with initiation of Entresto . Continue other GDMT.  Atrial fib - she had ERAF after DCCV. She will continue bid amiodarone.  Disp - probable home tomorrow if weight and renal function are stable.     For questions or updates, please contact [facility] Please consult [email or web address] for contact info under Cardiology/STEMI.      Signed, Danelle Birmingham, MD  06/01/2024, 10:00 AM

## 2024-06-02 ENCOUNTER — Telehealth: Payer: Self-pay

## 2024-06-02 DIAGNOSIS — I4891 Unspecified atrial fibrillation: Secondary | ICD-10-CM | POA: Diagnosis not present

## 2024-06-02 LAB — BASIC METABOLIC PANEL WITH GFR
Anion gap: 10 (ref 5–15)
BUN: 14 mg/dL (ref 8–23)
CO2: 21 mmol/L — ABNORMAL LOW (ref 22–32)
Calcium: 9.1 mg/dL (ref 8.9–10.3)
Chloride: 109 mmol/L (ref 98–111)
Creatinine, Ser: 1.08 mg/dL — ABNORMAL HIGH (ref 0.44–1.00)
GFR, Estimated: 56 mL/min — ABNORMAL LOW (ref >60)
Glucose, Bld: 89 mg/dL (ref 70–99)
Potassium: 4.2 mmol/L (ref 3.5–5.1)
Sodium: 140 mmol/L (ref 135–145)

## 2024-06-02 MED ORDER — FUROSEMIDE 40 MG PO TABS
40.0000 mg | ORAL_TABLET | Freq: Every day | ORAL | Status: DC
  Administered 2024-06-02 – 2024-06-04 (×3): 40 mg via ORAL
  Filled 2024-06-02 (×3): qty 1

## 2024-06-02 NOTE — Plan of Care (Signed)
  Problem: Clinical Measurements: Goal: Ability to maintain clinical measurements within normal limits will improve Outcome: Progressing   Problem: Activity: Goal: Risk for activity intolerance will decrease Outcome: Progressing   Problem: Nutrition: Goal: Adequate nutrition will be maintained Outcome: Progressing   Problem: Coping: Goal: Level of anxiety will decrease Outcome: Progressing   

## 2024-06-02 NOTE — Progress Notes (Signed)
 PROGRESS NOTE  Kimberly [name] Red  DOB: 03/19/1956  PCP: Georgina Speaks, FNP FMW:[id number]  DOA: 05/27/2024  LOS: 5 days  Hospital Day: 7  Subjective: Patient was seen and examined this morning. Lying down in bed.  Not in distress denies any chest pain Reports some back pain this morning. Afebrile, blood pressure 150s last night.  Breathing on room air Labs this morning with BUNs/creatinine 14/1.08 Pending SNF   Brief narrative: Kimberly [name] is a 68 y.o. female with PMH significant for HTN, HLD, prediabetes, CAD/CABG, HOCM, A-fib on Eliquis  CVA with residual left-sided weakness, wheelchair-bound status, anemia, COPD gout, osteoporosis. 10/14, patient presented to the ED with complaint of chest/back pain, urinary frequency, shortness of breath, palpitation. Found to be in A-fib with RVR with heart rate in the 130s Heart rate transiently improved with IV metoprolol  Admitted to [facility] Cardiology was consulted Currently pending SNF  Assessment and plan: A-fib/A-flutter with RVR  h/o persistent A-fib Cardiology consulted.  Started on amiodarone drip 10/16, underwent successful cardioversion but maintained normal sinus rhythm only for about 4 hours and back to a flutter Per cardiology recommendation, currently on carvedilol  12.5 mg twice daily,  amiodarone 200 mg for 2 weeks to be followed by 20 mg daily. Tentative plan to repeat cardioversion as an outpatient after Amio load Continue chronic anticoagulation with Eliquis    Chronic systolic CHF Moderate to Severe MR Apical Hypertrophic Cardiomyopathy HTN Echo with EF 45 to 50%. Appears euvolemic, compensated. Per cardiology, unclear cause of cardiomyopathy.  Ischemia versus tachycardia mediated.  May need ischemic evaluation versus cardiac MRI if no improvement in EF in next few weeks. Given 1 dose of Lasix  on 06/01/2019.  Dyspnea is improved.  Cardiology started on Lasix  40 mg daily today. Defer to cardiology for GDMT  titration. Currently also on Coreg  12.5 mg twice daily, Entresto  24-26 mg twice daily, hydralazine  25 mg 3 times daily, Imdur  30 mg daily Allergic to Aldactone  Poor candidate for SGLT2i due to bed sore  Elevated troponin  H/o CAD/CABG  Troponin trended up to peak at 616, trended down  No acute ischemic EKG changes.   Suspect demand ischemia in setting of A-fib with RVR Ischemia evaluation as an outpatient Continue aspirin , Eliquis .  Statin plan as below  HLD Off Lipitor  due to recent rhabdomyolysis Recent Labs  Lab 05/28/24 0056  CKTOTAL 580*    AKI Acute metabolic acidosis Baseline creatinine normal.  Patient with creatinine elevated to 1.54 With IV hydration, creatinine improved down to normal. However, serum bicarb level seems to be downtrending, lowest at 16 yesterday.  Repeat labs tomorrow. Currently not on IV fluid.  Continue to monitor creatinine and bicarb level.  Recent Labs    04/28/24 1124 04/29/24 0835 04/30/24 0821 05/01/24 0649 05/27/24 2045 05/28/24 0530 05/29/24 0255 05/30/24 0302 05/31/24 0243 06/02/24 1210  BUN 17  16 16 12 10  34* 33* 28* 19 14 14   CREATININE 0.89  0.88 0.78 0.89 0.76 1.54* 1.43* 1.32* 1.12* 0.95 1.08*  CO2 24  24 22 23 27  19* 19* 19* 21* 16* 21*   Hypokalemia  Resolved with replacement Recent Labs  Lab 05/27/24 2045 05/28/24 0530 05/29/24 0255 05/30/24 0302 05/31/24 0243 06/02/24 1210  K 3.4* 3.9 3.2* 3.5 4.5 4.2  MG 1.8 2.3 2.2 2.0  --   --   PHOS  --  3.4  --   --   --   --    Thoracic aortic aneurysm  Thoracic aortic aneurysm size is stable at  4.4 cm.   Follow-up as an outpatient for repeat CT in a year   Abdominal aortic aneurysm  infra-renal 3.0 cm, unchanged from prior imaging U/S in 3 years  Bilateral carotid artery stenosis  Medical management as above  Hx of CVA  Has residual left-sided weakness.   No acute changes Continue Eliquis    Chronic microcytic anemia  Hemoglobin stable above 9 Continue  to monitor Recent Labs    05/01/24 0649 05/27/24 2045 05/28/24 0530 05/29/24 0255 05/30/24 0302  HGB 11.0* 10.4* 9.0* 9.2* 9.7*  MCV 71.6* 72.7* 72.1* 72.7* 73.0*   Tobacco use  counseled on cessation   Pressure injury of skin -- POA Wound care RN consulted Continue wound care, off-loading pressures areas, closely monitor   Impaired mobility Likely wheelchair-bound status Seen by PT.  SNF recommended   Mobility:  PT Orders: Active   PT Follow up Rec: Skilled Nursing-Short Term Rehab (<3 Hours/Day)06/02/2024 1018   Goals of care   Code Status: Full Code     DVT prophylaxis:   apixaban  (ELIQUIS ) tablet 5 mg   Antimicrobials: None Fluid: None currently Consultants: Cardiology Family Communication: None at bedside  Status: Inpatient Level of care:  Telemetry Medical   Patient is from: Home Needs to continue in-hospital care: Ongoing titration of meds.  Repeat labs tomorrow. Anticipated d/c to: PT recommended SNF      Diet:  Diet Order             Diet Heart Room service appropriate? Yes; Fluid consistency: Thin  Diet effective now                   Scheduled Meds:  amiodarone  200 mg Oral BID   apixaban   5 mg Oral BID   aspirin  EC  81 mg Oral Daily   bisacodyl   10 mg Rectal Once   carvedilol   12.5 mg Oral BID WC   docusate sodium   100 mg Oral BID   furosemide   40 mg Oral Daily   hydrALAZINE   25 mg Oral Q8H   isosorbide  mononitrate  30 mg Oral Daily   mirtazapine   15 mg Oral QHS   pantoprazole   40 mg Oral Daily   sacubitril -valsartan   1 tablet Oral BID    PRN meds: acetaminophen  **OR** acetaminophen , hydrALAZINE , HYDROcodone -acetaminophen , ipratropium-albuterol , polyethylene glycol, prochlorperazine   Infusions:    Antimicrobials: Anti-infectives (From admission, onward)    None       Objective: Vitals:   06/01/24 2300 06/02/24 1001  BP: (!) 153/91 (!) 119/97  Pulse: 87 96  Resp: 12 18  Temp: 97.6 F (36.4 C) 97.6 F  (36.4 C)  SpO2:  99%    Intake/Output Summary (Last 24 hours) at 06/02/2024 1338 Last data filed at 06/02/2024 1314 Gross per 24 hour  Intake 477 ml  Output 700 ml  Net -223 ml   Filed Weights   05/28/24 0022 05/28/24 1553  Weight: 56.7 kg 63.4 kg   Weight change:  Body mass index is 21.89 kg/m.   Physical Exam: General exam: Pleasant, elderly African-American female.  Not in distress Skin: No rashes, lesions or ulcers. HEENT: Atraumatic, normocephalic, no obvious bleeding Lungs: Clear to auscultation bilaterally,  CVS: S1, S2, no murmur,   GI/Abd: Soft, nontender, nondistended, bowel sound present,   CNS: Alert, awake, oriented to place and person, baseline weakness in the left side due to previous stroke Psychiatry: Sad affect Extremities: No pedal edema, no calf tenderness,   Data Review: I have  personally reviewed the laboratory data and studies available.  F/u labs ordered Unresulted Labs (From admission, onward)     Start     Ordered   06/03/24 0500  Basic metabolic panel with GFR  Tomorrow morning,   R       Question:  Specimen collection method  Answer:  Lab=Lab collect   06/02/24 1336   06/03/24 0500  CK  Tomorrow morning,   R       Question:  Specimen collection method  Answer:  Lab=Lab collect   06/02/24 1336   06/01/24 0500  CBC with Differential/Platelet  Tomorrow morning,   R       Question:  Specimen collection method  Answer:  Lab=Lab collect   05/31/24 0852            Signed, Chapman Rota, MD [facility] 06/02/2024

## 2024-06-02 NOTE — Progress Notes (Signed)
 Pt refusing morning labs.

## 2024-06-02 NOTE — Progress Notes (Signed)
 Physical Therapy Treatment Patient Details Name: Kimberly [name] MRN: [id number] DOB: Mar 11, 1956 Today's Date: 06/02/2024   History of Present Illness 68 y.o. F adm 05/27/24 from home for back and chest pain in Afib with RVR. 10/16 DCCV. PMHx: Admission 9/12-9/20 for NSTEMI and T8 fx with D/C to SNF. HTN, CVA with Lt  weakness, HLD, gout, CAD, CM, A-Fib, CAD s/p CABG, anemia, COPD, postural dizziness.    PT Comments  Pt pleasant and reports living alone and able to get OOB pulling on RW. PT required mod assist for bed mobility, mod +2 to rise from chair and able to progress to limited 10' of gait. Pt remains without TLSO present and requested pt have family bring it in. Pt educated for transfers, gait and function. Patient will benefit from continued inpatient follow up therapy, <3 hours/day    If plan is discharge home, recommend the following: Assistance with cooking/housework;Assist for transportation;Help with stairs or ramp for entrance;Supervision due to cognitive status;Direct supervision/assist for medications management;A lot of help with walking and/or transfers;A lot of help with bathing/dressing/bathroom   Can travel by private vehicle     No  Equipment Recommendations  Hospital bed;Hoyer lift;Wheelchair (measurements PT);Wheelchair cushion (measurements PT)    Recommendations for Other Services       Precautions / Restrictions Precautions Precautions: Fall Recall of Precautions/Restrictions: Impaired Precaution/Restrictions Comments: No formal orders for back precautions. Followed for pt comfort and out of caution d/t pt's recent T8 compression fx. She was given a TLSO brace last admission, but it was not present. requested family bring brace     Mobility  Bed Mobility Overal bed mobility: Needs Assistance Bed Mobility: Rolling, Sidelying to Sit Rolling: Mod assist Sidelying to sit: Mod assist       General bed mobility comments: mod assist to roll with pt  unable to accurately judge positioning and needing assist to remain on surface, mod assist to elevate trunk and clear legs    Transfers Overall transfer level: Needs assistance   Transfers: Sit to/from Stand Sit to Stand: Min assist, Mod assist           General transfer comment: min assist to rise from elevated bed with cues for hand placement and safety. Mod +2 assist to rise from chair for 3 trials    Ambulation/Gait Ambulation/Gait assistance: Min assist, +2 safety/equipment Gait Distance (Feet): 10 Feet Assistive device: Rolling walker (2 wheels) Gait Pattern/deviations: Step-through pattern, Decreased stride length, Narrow base of support, Trunk flexed   Gait velocity interpretation: <1.8 ft/sec, indicate of risk for recurrent falls   General Gait Details: cues for posture, proximity to RW, safety and close chair follow   Stairs             Wheelchair Mobility     Tilt Bed    Modified Rankin (Stroke Patients Only)       Balance Overall balance assessment: Needs assistance Sitting-balance support: Bilateral upper extremity supported, Feet supported Sitting balance-Leahy Scale: Poor Sitting balance - Comments: CGA sitting   Standing balance support: Bilateral upper extremity supported, Reliant on assistive device for balance, During functional activity Standing balance-Leahy Scale: Poor Standing balance comment: Rw in standing                            Communication Communication Communication: Impaired Factors Affecting Communication: Hearing impaired  Cognition Arousal: Alert Behavior During Therapy: WFL for tasks assessed/performed   PT - Cognitive impairments: No  family/caregiver present to determine baseline                         Following commands: Impaired Following commands impaired: Follows one step commands inconsistently, Follows one step commands with increased time    Cueing Cueing Techniques: Verbal cues,  Gestural cues  Exercises      General Comments        Pertinent Vitals/Pain Pain Assessment Faces Pain Scale: Hurts little more Pain Location: generalized Pain Descriptors / Indicators: Aching, Sore Pain Intervention(s): Limited activity within patient's tolerance, Monitored during session, Repositioned    Home Living                          Prior Function            PT Goals (current goals can now be found in the care plan section) Progress towards PT goals: Progressing toward goals    Frequency    Min 2X/week      PT Plan      Co-evaluation              AM-PAC PT 6 Clicks Mobility   Outcome Measure  Help needed turning from your back to your side while in a flat bed without using bedrails?: A Lot Help needed moving from lying on your back to sitting on the side of a flat bed without using bedrails?: A Lot Help needed moving to and from a bed to a chair (including a wheelchair)?: Total Help needed standing up from a chair using your arms (e.g., wheelchair or bedside chair)?: Total Help needed to walk in hospital room?: Total Help needed climbing 3-5 steps with a railing? : Total 6 Click Score: 8    End of Session Equipment Utilized During Treatment: Gait belt Activity Tolerance: Patient limited by fatigue Patient left: in chair;with call bell/phone within reach;with chair alarm set Nurse Communication: Mobility status PT Visit Diagnosis: Pain;Muscle weakness (generalized) (M62.81);Difficulty in walking, not elsewhere classified (R26.2);Unsteadiness on feet (R26.81);Other abnormalities of gait and mobility (R26.89)     Time: 9243-9183 PT Time Calculation (min) (ACUTE ONLY): 20 min  Charges:    $Therapeutic Activity: 8-22 mins PT General Charges $$ ACUTE PT VISIT: 1 Visit                     Lenoard SQUIBB, PT Acute Rehabilitation Services Office: [phone]    Lenoard NOVAK Caylen Yardley 06/02/2024, 10:19 AM

## 2024-06-02 NOTE — TOC Progression Note (Signed)
 Transition of Care [facility]) - Progression Note    Patient Details  Name: Kimberly [name] MRN: [id number] Date of Birth: 28-Feb-1956  Transition of Care [facility]) CM/SW Contact  [name] Phone Number: [phone] 06/02/2024, 1:17 PM  Clinical Narrative:   CSW met with patient at bedside to discuss accepted SNF bed offers. Patient stated that she is only interested in facilities here in the [place] area. Patient stated that she would like to go back to [facility]. CSW explained that at this time the facility has declined. Patient stated that she is ok with [facility].   1:14 PM- CSW called the patients daughter, [name] and left a VM.   1:15 pm- CSW called the patients sister, [name] and left a VM.   Unit CSW will continue to follow and monitor for dc readiness.   Expected Discharge Plan: Skilled Nursing Facility Barriers to Discharge: Continued Medical Work up, SNF Pending bed offer, Insurance Authorization               Expected Discharge Plan and Services In-house Referral: Clinical Social Work     Living arrangements for the past 2 months: Single Family Home                                       Social Drivers of Health (SDOH) Interventions SDOH Screenings   Food Insecurity: No Food Insecurity (05/28/2024)  Housing: High Risk (05/28/2024)  Transportation Needs: No Transportation Needs (05/28/2024)  Utilities: At Risk (05/28/2024)  Alcohol Screen: Low Risk  (09/21/2023)  Depression (PHQ2-9): Low Risk  (04/02/2024)  Financial Resource Strain: Low Risk  (09/21/2023)  Physical Activity: Unknown (09/21/2023)  Social Connections: Socially Isolated (05/28/2024)  Stress: No Stress Concern Present (09/21/2023)  Tobacco Use: Medium Risk (05/29/2024)  Health Literacy: Inadequate Health Literacy (09/21/2023)    Readmission Risk Interventions     No data to display

## 2024-06-02 NOTE — Progress Notes (Addendum)
 Progress Note  Patient Name: Kimberly [name] Date of Encounter: 06/02/2024 [facility] Cardiologist: Lynwood Schilling, MD   Interval Summary   Patient tells me that she is having back pain this AM. She also has a fluttering feeling in her chest. Breathing is so-so   Vital Signs Vitals:   06/01/24 1230 06/01/24 1900 06/01/24 2300 06/02/24 1001  BP:  139/89 (!) 153/91 (!) 119/97  Pulse:  85 87 96  Resp:  15 12 18   Temp:  98.2 F (36.8 C) 97.6 F (36.4 C) 97.6 F (36.4 C)  TempSrc: Oral Oral Oral Oral  SpO2:  100%  99%  Weight:      Height:        Intake/Output Summary (Last 24 hours) at 06/02/2024 1049 Last data filed at 06/02/2024 0600 Gross per 24 hour  Intake 240 ml  Output 700 ml  Net -460 ml      05/28/2024    3:53 PM 05/28/2024   12:22 AM 04/25/2024    4:25 PM  Last 3 Weights  Weight (lbs) 139 lb 12.4 oz 125 lb 125 lb  Weight (kg) 63.4 kg 56.7 kg 56.7 kg      Telemetry/ECG  Atrial flutter, HR 80s - Personally Reviewed  Physical Exam  GEN: No acute distress.  Laying in the bed with head elevated  Neck: No JVD Cardiac:  RRR, faint systolic murmur at apex  Respiratory: Clear to auscultation bilaterally. Normal WOB  GI: Soft, nontender, non-distended  MS: No edema in BLE  Assessment & Plan  Atrial Fibrillation  - Has longstanding persistent atrial fibrillation. Presented in RVR. Started on IV amiodarone and converted to atrial flutter. Underwent DCCV 10/16 but only maintaining NSR for about 4 hours  - Patient remains in atrial flutter, HR well controlled  - Continue eliquis  5 mg BID  - Continue carvedilol  12.5 mg BID  - Continue amiodarone 200 mg BID- continue for 2 weeks. Then, decrease to 200 mg daily  - Could consider repeat cardioversion as an outpatient   Acute on chronic systolic heart failure  - Echocardiogram this admission showed EF 45-50%, mild LVH, mildly reduced RV systolic function, moderate-sever MR. Has known apical HCM -  Unclear cause of cardiomyopathy. Possibly related with afib with RVR.  - Plan to repeat echo as an outpatient after HR better controlled/patient converted to NSR. If EF remains low, may need ischemic eval  - Could also consider outpatient CMRI for mitral valve disease. This can be done as an outpatient  - Start PO lasix  40 mg daily  - Continue carvedilol  12.5 mg BID - Continue hydarlazine 25 mg TID - Continue imdur  30 mg daily  - Continue entresto  24-26 mg BID  - Ordered BMP today for medication monitoring   CAD s/p CABG 2008  - Continue ASA 81 mg daily, carvedilol  12.5 mg BID, imdur  30 mg daily  - Statin held due to elevated CK on admission.   For questions or updates, please contact [facility] Please consult [email or web address] for contact info under   Signed, Rollo FABIENE Louder, PA-C   History and all data above reviewed.  I personally took the history today, performed the physical exam and formulated the assessment and plan.  I reviewed all relevant tests and studies. Patient examined.  I agree with the findings as above. Patient known to me although I have not seen her in a couple of years.  She was noted to be in atrial fib on presentation.  She is not feeling any palpitations.  She is breathing OK but not quite at baseline.  Denies chest pain. The patient exam reveals COR: RRR  ,  Lungs: Clear  ,  Abd: Positive bowel sounds, no rebound no guarding, Ext no edema  .  All available labs, radiology testing, previous records reviewed. Agree with documented assessment and plan.   Atrial Fib:  Plan continue amio and follow up in office for probable repeat DCCV in the future.  Acute systolic HF:  Med management as above.  I will likely reassess after we maintain NSR and if EF remains low I will consider further ischemia work up.    Lynwood Maynor Mwangi  1:46 PM  06/02/2024

## 2024-06-02 NOTE — TOC Progression Note (Signed)
 Transition of Care [facility]) - Progression Note    Patient Details  Name: Kimberly [name] MRN: [id number] Date of Birth: 12/13/1955  Transition of Care [facility]) CM/SW Contact  Waddell Barnie Rama, RN Phone Number: 06/02/2024, 4:15 PM  Clinical Narrative:    NCM spoke with [name] and the [facility] service rep on the phone, they informed [name] that she needs to go thru her PCP to have them to fill out the form to increase patient hours and aso [name] needs to fax in [place] forms to them.  The patient spoke to rep here on the phone and gave her consent to change to [facility] as an agency.     Expected Discharge Plan: Skilled Nursing Facility Barriers to Discharge: Continued Medical Work up, SNF Pending bed offer, Insurance Authorization               Expected Discharge Plan and Services In-house Referral: Clinical Social Work     Living arrangements for the past 2 months: Single Family Home                                       Social Drivers of Health (SDOH) Interventions SDOH Screenings   Food Insecurity: No Food Insecurity (05/28/2024)  Housing: High Risk (05/28/2024)  Transportation Needs: No Transportation Needs (05/28/2024)  Utilities: At Risk (05/28/2024)  Alcohol Screen: Low Risk  (09/21/2023)  Depression (PHQ2-9): Low Risk  (04/02/2024)  Financial Resource Strain: Low Risk  (09/21/2023)  Physical Activity: Unknown (09/21/2023)  Social Connections: Socially Isolated (05/28/2024)  Stress: No Stress Concern Present (09/21/2023)  Tobacco Use: Medium Risk (05/29/2024)  Health Literacy: Inadequate Health Literacy (09/21/2023)    Readmission Risk Interventions     No data to display

## 2024-06-02 NOTE — Telephone Encounter (Signed)
 Copied from CRM #[id number]. Topic: General - Other >> Jun 02, 2024  4:26 PM Lauren C wrote: Reason for CRM: Daughter [name] (on [place]) calling to let us  know pt is in hospital with [facility] and she is requesting them to do a change of status for her to receive more hours for pca for the cap program(?). They said the hospital providers cannot do it and it must be her PCP. Best callback # for any questions is [phone]

## 2024-06-03 DIAGNOSIS — I4891 Unspecified atrial fibrillation: Secondary | ICD-10-CM | POA: Diagnosis not present

## 2024-06-03 NOTE — Progress Notes (Signed)
 PROGRESS NOTE  Kimberly [name]  DOB: [birth year]  PCP: Georgina Speaks, FNP FMW:[id number]  DOA: 05/27/2024  LOS: 6 days  Hospital Day: 8  Subjective: Patient was seen and examined this morning. Elderly African-American female.  Lying on bed.  Not in distress. Afebrile, hemodynamically stable, breathing on room air Pending SNF   Brief narrative: Kimberly [name] is a 68 y.o. female with PMH significant for HTN, HLD, prediabetes, CAD/CABG, HOCM, A-fib on Eliquis  CVA with residual left-sided weakness, wheelchair-bound status, anemia, COPD gout, osteoporosis. 10/14, patient presented to the ED with complaint of chest/back pain, urinary frequency, shortness of breath, palpitation. Found to be in A-fib with RVR with heart rate in the 130s Heart rate transiently improved with IV metoprolol  Admitted to [facility] Cardiology was consulted Currently pending SNF  Assessment and plan: A-fib/A-flutter with RVR  h/o persistent A-fib Cardiology consulted.  Started on amiodarone drip 10/16, underwent successful cardioversion but maintained normal sinus rhythm only for about 4 hours and back to a flutter Per cardiology recommendation, currently on carvedilol  12.5 mg twice daily,  amiodarone 200 mg for 2 weeks to be followed by 20 mg daily. Tentative plan to repeat cardioversion as an outpatient after Amio load Continue chronic anticoagulation with Eliquis    Chronic systolic CHF Moderate to Severe MR Apical Hypertrophic Cardiomyopathy HTN Seen by cardiology.   Echo with EF 45 to 50%. Appears euvolemic, compensated. Per cardiology, unclear cause of cardiomyopathy.  Ischemia versus tachycardia mediated.  May need ischemic evaluation versus cardiac MRI if no improvement in EF in next few weeks. Per cardio recommendation, currently on Coreg  12.5 mg twice daily, Entresto  24-26 mg twice daily, hydralazine  25 mg 3 times daily, Imdur  30 mg daily and Lasix  40 mg daily Allergic to Aldactone  Poor  candidate for SGLT2i due to bed sore  Elevated troponin  H/o CAD/CABG  Troponin trended up to peak at 616, trended down  No acute ischemic EKG changes.   Suspect demand ischemia in setting of A-fib with RVR Recommended ischemia evaluation as an outpatient Continue aspirin , Eliquis .  Statin plan as below  HLD Off Lipitor  due to recent rhabdomyolysis Recent Labs  Lab 05/28/24 0056  CKTOTAL 580*    AKI Acute metabolic acidosis Baseline creatinine normal.  Patient with creatinine elevated to 1.54 With IV hydration, creatinine improved down to normal.  Bicarb level improved as well in the last 3 days Recent Labs    04/28/24 1124 04/29/24 0835 04/30/24 0821 05/01/24 0649 05/27/24 2045 05/28/24 0530 05/29/24 0255 05/30/24 0302 05/31/24 0243 06/02/24 1210  BUN 17  16 16 12 10  34* 33* 28* 19 14 14   CREATININE 0.89  0.88 0.78 0.89 0.76 1.54* 1.43* 1.32* 1.12* 0.95 1.08*  CO2 24  24 22 23 27  19* 19* 19* 21* 16* 21*   Hypokalemia  Resolved with replacement Recent Labs  Lab 05/27/24 2045 05/28/24 0530 05/29/24 0255 05/30/24 0302 05/31/24 0243 06/02/24 1210  K 3.4* 3.9 3.2* 3.5 4.5 4.2  MG 1.8 2.3 2.2 2.0  --   --   PHOS  --  3.4  --   --   --   --    Thoracic aortic aneurysm  Thoracic aortic aneurysm size is stable at 4.4 cm.   Follow-up as an outpatient for repeat CT in a year   Abdominal aortic aneurysm  infra-renal 3.0 cm, unchanged from prior imaging U/S in 3 years  Bilateral carotid artery stenosis  Medical management as above  Hx of CVA  Has residual left-sided  weakness.   No acute changes Continue Eliquis    Chronic microcytic anemia  Hemoglobin stable above 9 Continue to monitor Recent Labs    05/01/24 0649 05/27/24 2045 05/28/24 0530 05/29/24 0255 05/30/24 0302  HGB 11.0* 10.4* 9.0* 9.2* 9.7*  MCV 71.6* 72.7* 72.1* 72.7* 73.0*   Tobacco use  counseled on cessation   Pressure injury of skin -- POA Wound care RN consulted Continue  wound care, off-loading pressures areas, closely monitor   Impaired mobility Likely wheelchair-bound status Seen by PT.  SNF recommended   Mobility:  PT Orders: Active   PT Follow up Rec: Skilled Nursing-Short Term Rehab (<3 Hours/Day)06/02/2024 1018   Goals of care   Code Status: Full Code     DVT prophylaxis:   apixaban  (ELIQUIS ) tablet 5 mg   Antimicrobials: None Fluid: None currently Consultants: Cardiology Family Communication: None at bedside  Status: Inpatient Level of care:  Telemetry Medical   Patient is from: Home Needs to continue in-hospital care: Medically stable for discharge to SNF.  Pending authorization    Diet:  Diet Order             Diet Heart Room service appropriate? Yes; Fluid consistency: Thin  Diet effective now                   Scheduled Meds:  amiodarone  200 mg Oral BID   apixaban   5 mg Oral BID   aspirin  EC  81 mg Oral Daily   bisacodyl   10 mg Rectal Once   carvedilol   12.5 mg Oral BID WC   docusate sodium   100 mg Oral BID   furosemide   40 mg Oral Daily   hydrALAZINE   25 mg Oral Q8H   isosorbide  mononitrate  30 mg Oral Daily   mirtazapine   15 mg Oral QHS   pantoprazole   40 mg Oral Daily   sacubitril -valsartan   1 tablet Oral BID    PRN meds: acetaminophen  **OR** acetaminophen , hydrALAZINE , HYDROcodone -acetaminophen , ipratropium-albuterol , polyethylene glycol, prochlorperazine   Infusions:    Antimicrobials: Anti-infectives (From admission, onward)    None       Objective: Vitals:   06/03/24 0532 06/03/24 0715  BP: (!) 133/114 (!) 116/94  Pulse: 80   Resp: 14 16  Temp: 97.7 F (36.5 C) (!) 97.5 F (36.4 C)  SpO2: 100%     Intake/Output Summary (Last 24 hours) at 06/03/2024 1003 Last data filed at 06/03/2024 0900 Gross per 24 hour  Intake 597 ml  Output 900 ml  Net -303 ml   Filed Weights   05/28/24 0022 05/28/24 1553  Weight: 56.7 kg 63.4 kg   Weight change:  Body mass index is 21.89  kg/m.   Physical Exam: General exam: Pleasant, elderly African-American female.  Not in distress Skin: No rashes, lesions or ulcers. HEENT: Atraumatic, normocephalic, no obvious bleeding Lungs: Clear to auscultation bilaterally,  CVS: S1, S2, no murmur,   GI/Abd: Soft, nontender, nondistended, bowel sound present,   CNS: Alert, awake, oriented to place and person, baseline weakness in the left side due to previous stroke Psychiatry: Sad affect Extremities: No pedal edema, no calf tenderness,   Data Review: I have personally reviewed the laboratory data and studies available.  F/u labs ordered Unresulted Labs (From admission, onward)     Start     Ordered   06/01/24 0500  CBC with Differential/Platelet  Tomorrow morning,   R       Question:  Specimen collection method  Answer:  Lab=Lab collect   05/31/24 9147            Signed, Chapman Rota, MD [facility] 06/03/2024

## 2024-06-03 NOTE — Plan of Care (Signed)
  Problem: Clinical Measurements: Goal: Ability to maintain clinical measurements within normal limits will improve Outcome: Progressing   Problem: Nutrition: Goal: Adequate nutrition will be maintained Outcome: Progressing   Problem: Pain Managment: Goal: General experience of comfort will improve and/or be controlled Outcome: Progressing

## 2024-06-03 NOTE — Treatment Plan (Signed)
 Occupational Therapy Treatment Patient Details Name: Kimberly [name] MRN: [id number] DOB: 04/11/1956 Today's Date: 06/03/2024   History of present illness 68 y.o. F adm 05/27/24 from home for back and chest pain in Afib with RVR. 10/16 DCCV. PMHx: Admission 9/12-9/20 for NSTEMI and T8 fx with D/C to SNF. HTN, CVA with Lt  weakness, HLD, gout, CAD, CM, A-Fib, CAD s/p CABG, anemia, COPD, postural dizziness.   OT comments  Pt supine and reports no oob today so agreeable to attempt. Pt attempting sit<>Stand x2 without ability to clear bed surface. No brace located in room at this time. Pt completed eob dangle CGA  applying lotion to extremities. Recommendation remains appropriate skilled inpatient follow up therapy, <3 hours/day.       If plan is discharge home, recommend the following:  Two people to help with walking and/or transfers;Two people to help with bathing/dressing/bathroom   Equipment Recommendations  Other (comment)    Recommendations for Other Services Rehab consult    Precautions / Restrictions Precautions Precautions: Fall Recall of Precautions/Restrictions: Impaired Precaution/Restrictions Comments: No formal orders for back precautions. Followed for pt comfort and out of caution d/t pt's recent T8 compression fx. She was given a TLSO brace last admission, but it was not present. requested family bring brace Restrictions Weight Bearing Restrictions Per Provider Order: No       Mobility Bed Mobility Overal bed mobility: Needs Assistance Bed Mobility: Rolling, Supine to Sit, Sit to Supine Rolling: Min assist   Supine to sit: Mod assist Sit to supine: Mod assist   General bed mobility comments: pt needs (A) to elevate trunk from surface. pt needs (A) to bring bil LE back on bed surface. pt able to bridge buttocks . pt able to push posterior in a long sitting position with bed tilted to reach [facility] better. pt sitting eob with CGA at times with cues to place LLE back  on floor    Transfers Overall transfer level: Needs assistance                 General transfer comment: attempted x2 without ability to clear bed surface even when bed elevated     Balance Overall balance assessment: Needs assistance   Sitting balance-Leahy Scale: Poor   Postural control: Posterior lean, Left lateral lean                                 ADL either performed or assessed with clinical judgement   ADL Overall ADL's : Needs assistance/impaired Eating/Feeding: Set up;Bed level               Upper Body Dressing : Minimal assistance Upper Body Dressing Details (indicate cue type and reason): don new gown                        Extremity/Trunk Assessment Upper Extremity Assessment Upper Extremity Assessment: Generalized weakness;Right hand dominant;LUE deficits/detail LUE Deficits / Details: noted to have hyperextension of joints, 4th digit with trigger finger response to flexion / extension. pt able to extend and flex all digits WFL LUE Coordination: decreased fine motor   Lower Extremity Assessment Lower Extremity Assessment: Defer to PT evaluation        Vision       Perception     Praxis     Communication Communication Communication: Impaired Factors Affecting Communication: Hearing impaired   Cognition Arousal: Alert Behavior  During Therapy: WFL for tasks assessed/performed Cognition: Cognition impaired             OT - Cognition Comments: pt rubbing legs and brushing the bed report stuff in the bed that is not visually present. OT touching bed surface and do not feel any substance or texture changes. pt only doing this in upright posture.                 Following commands: Impaired Following commands impaired: Follows one step commands with increased time      Cueing   Cueing Techniques: Verbal cues, Gestural cues, Tactile cues  Exercises      Shoulder Instructions       General  Comments VSS on 2 L  ( BP, 02 and HR monitored)    Pertinent Vitals/ Pain       Pain Assessment Pain Assessment: No/denies pain  Home Living                                          Prior Functioning/Environment              Frequency  Min 2X/week        Progress Toward Goals  OT Goals(current goals can now be found in the care plan section)  Progress towards OT goals: Progressing toward goals  Acute Rehab OT Goals Patient Stated Goal: none stated OT Goal Formulation: With patient Time For Goal Achievement: 06/11/24 Potential to Achieve Goals: Good ADL Goals Pt Will Perform Lower Body Dressing: with min assist;sit to/from stand Pt Will Transfer to Toilet: with min assist;bedside commode;stand pivot transfer Pt Will Perform Toileting - Clothing Manipulation and hygiene: with mod assist;sit to/from stand  Plan      Co-evaluation                 AM-PAC OT 6 Clicks Daily Activity     Outcome Measure   Help from another person eating meals?: A Little Help from another person taking care of personal grooming?: A Little Help from another person toileting, which includes using toliet, bedpan, or urinal?: Total Help from another person bathing (including washing, rinsing, drying)?: A Lot Help from another person to put on and taking off regular upper body clothing?: A Little Help from another person to put on and taking off regular lower body clothing?: A Lot 6 Click Score: 14    End of Session Equipment Utilized During Treatment: Oxygen  OT Visit Diagnosis: Unsteadiness on feet (R26.81);Other abnormalities of gait and mobility (R26.89);Muscle weakness (generalized) (M62.81);Pain   Activity Tolerance Patient tolerated treatment well   Patient Left in bed;with call bell/phone within reach;with bed alarm set   Nurse Communication Mobility status;Precautions        Time: 1400-1416 OT Time Calculation (min): 16 min  Charges: OT  General Charges $OT Visit: 1 Visit OT Treatments $Self Care/Home Management : 8-22 mins   Brynn, OTR/L  Acute Rehabilitation Services Office: [phone] .   Ely Molt 06/03/2024, 3:22 PM

## 2024-06-03 NOTE — TOC Progression Note (Addendum)
 Transition of Care [facility]) - Progression Note    Patient Details  Name: Kimberly [name] MRN: [id number] Date of Birth: May 19, 1956  Transition of [facility]) CM/SW Contact  Luise JAYSON Pan, [place] Phone Number: 06/03/2024, 8:34 AM  Clinical Narrative:   CSW spoke with Jon, RD for [facility], about bed availability at [facility]. Per Jon, she will check with admissions about female bed availability.  CSW explained to [place], family's situation with the previous SNF patient was at. CSW informed her that patient was at [facility] from 9/20-10/4.   10:23 AM Per Jon with [facility], facility can accept patient as early as today. CSW notified patient, who is ox4, and patient is agreeable.   CSW left VM for [name] (pts sister) at [phone]. CSW left VM for Kiki (pts dtr) at [phone].   10:38 AM CSW started insurance authorization and it is now pending. Auth id [id number].   2:15 PM Patients auth still pending.   4:23 PM CSW spoke with [name] about patient choosing [facility]. [name] stated she really wants patients PCS hours increased. CSW reviewed note from [facility] yesterday about patient following up with PCP for services. [name] stated she understood and asked if MD would be willing to write a letter stating patient needs an increase in services. CSW inquired with MD. [name] asked if she will be notified about patients transition.  5:42 PM Per MD, he does not think he can make decisions about after SNF care.    CSW will continue to follow.    Expected Discharge Plan: Skilled Nursing Facility Barriers to Discharge: Continued Medical Work up, SNF Pending bed offer, Insurance Authorization               Expected Discharge Plan and Services In-house Referral: Clinical Social Work     Living arrangements for the past 2 months: Single Family Home                                       Social Drivers of Health (SDOH) Interventions SDOH Screenings   Food Insecurity: No Food Insecurity  (05/28/2024)  Housing: High Risk (05/28/2024)  Transportation Needs: No Transportation Needs (05/28/2024)  Utilities: At Risk (05/28/2024)  Alcohol Screen: Low Risk  (09/21/2023)  Depression (PHQ2-9): Low Risk  (04/02/2024)  Financial Resource Strain: Low Risk  (09/21/2023)  Physical Activity: Unknown (09/21/2023)  Social Connections: Socially Isolated (05/28/2024)  Stress: No Stress Concern Present (09/21/2023)  Tobacco Use: Medium Risk (05/29/2024)  Health Literacy: Inadequate Health Literacy (09/21/2023)    Readmission Risk Interventions     No data to display

## 2024-06-03 NOTE — Progress Notes (Signed)
 Progress Note  Patient Name: Kimberly [name] Date of Encounter: 06/03/2024  Primary Cardiologist:   Lynwood Schilling, MD   Subjective   She denies any acute SOB or pain.    Inpatient Medications    Scheduled Meds:  amiodarone  200 mg Oral BID   apixaban   5 mg Oral BID   aspirin  EC  81 mg Oral Daily   bisacodyl   10 mg Rectal Once   carvedilol   12.5 mg Oral BID WC   docusate sodium   100 mg Oral BID   furosemide   40 mg Oral Daily   hydrALAZINE   25 mg Oral Q8H   isosorbide  mononitrate  30 mg Oral Daily   mirtazapine   15 mg Oral QHS   pantoprazole   40 mg Oral Daily   sacubitril -valsartan   1 tablet Oral BID   Continuous Infusions:  PRN Meds: acetaminophen  **OR** acetaminophen , hydrALAZINE , HYDROcodone -acetaminophen , ipratropium-albuterol , polyethylene glycol, prochlorperazine   Vital Signs    Vitals:   06/02/24 2021 06/03/24 0010 06/03/24 0532 06/03/24 0715  BP: (!) 148/109 (!) 120/98 (!) 133/114 (!) 116/94  Pulse: (!) 55 86 80   Resp: 15 20 14 16   Temp: 97.7 F (36.5 C) 97.6 F (36.4 C) 97.7 F (36.5 C) (!) 97.5 F (36.4 C)  TempSrc: Oral Oral Oral Oral  SpO2: 93% 94% 100%   Weight:      Height:        Intake/Output Summary (Last 24 hours) at 06/03/2024 0909 Last data filed at 06/03/2024 0534 Gross per 24 hour  Intake 477 ml  Output 900 ml  Net -423 ml   Filed Weights   05/28/24 0022 05/28/24 1553  Weight: 56.7 kg 63.4 kg    Telemetry    Atrial fib with controlled rate - Personally Reviewed  ECG    NA - Personally Reviewed  Physical Exam   GEN: No acute distress.   Neck: No  JVD Cardiac: RRR, no murmurs, rubs, or gallops.  Respiratory: Clear  to auscultation bilaterally. GI: Soft, nontender, non-distended  MS: No  edema; No deformity. Neuro:  Nonfocal  Psych: Normal affect   Labs    Chemistry Recent Labs  Lab 05/28/24 0056 05/28/24 0530 05/29/24 0255 05/30/24 0302 05/31/24 0243 06/02/24 1210  NA  --  136   < > 140 143 140   K  --  3.9   < > 3.5 4.5 4.2  CL  --  107   < > 113* 114* 109  CO2  --  19*   < > 21* 16* 21*  GLUCOSE  --  79   < > 79 79 89  BUN  --  33*   < > 19 14 14   CREATININE  --  1.43*   < > 1.12* 0.95 1.08*  CALCIUM   --  8.6*   < > 8.5* 8.5* 9.1  PROT 5.9* 5.3*  --   --   --   --   ALBUMIN 3.4* 3.2*  --   --   --   --   AST 36 37  --   --   --   --   ALT 30 28  --   --   --   --   ALKPHOS 46 47  --   --   --   --   BILITOT 0.7 0.8  --   --   --   --   GFRNONAA  --  40*   < > 54* >60 56*  ANIONGAP  --  10   < > 6 13 10    < > = values in this interval not displayed.     Hematology Recent Labs  Lab 05/28/24 0530 05/29/24 0255 05/30/24 0302  WBC 8.7 5.9 6.3  RBC 3.90 4.03 4.33  HGB 9.0* 9.2* 9.7*  HCT 28.1* 29.3* 31.6*  MCV 72.1* 72.7* 73.0*  MCH 23.1* 22.8* 22.4*  MCHC 32.0 31.4 30.7  RDW 16.7* 16.7* 17.2*  PLT 154 152 165    Cardiac EnzymesNo results for input(s): TROPONINI in the last 168 hours. No results for input(s): TROPIPOC in the last 168 hours.   BNP Recent Labs  Lab 05/28/24 0849  BNP 1,244.1*     DDimer  Recent Labs  Lab 05/28/24 0056  DDIMER 0.55*     Radiology    No results found.  Cardiac Studies   Echo:    1. Left ventricular ejection fraction, by estimation, is 45 to 50%. The  left ventricle has mildly decreased function. The left ventricle  demonstrates regional wall motion abnormalities (see scoring  diagram/findings for description). There is mild left  ventricular hypertrophy. Left ventricular diastolic parameters are  indeterminate.   2. Right ventricular systolic function is mildly reduced. The right  ventricular size is normal. There is normal pulmonary artery systolic  pressure. The estimated right ventricular systolic pressure is 26.8 mmHg.   3. Left atrial size was severely dilated.   4. The mitral valve is abnormal. Moderate to severe mitral valve  regurgitation. Visually MR appears moderate, but may be underestimating as   splay artifact is seen, though low E wave velocity argues against severe  MR   5. The aortic valve was not well visualized. Aortic valve regurgitation  is not visualized. No aortic stenosis is present.   6. Aortic dilatation noted. Aneurysm of the ascending aorta, measuring 47  mm.   7. The inferior vena cava is dilated in size with >50% respiratory  variability, suggesting right atrial pressure of 8 mmHg.   Patient Profile     68 y.o. 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    Assessment & Plan    Atrial Fibrillation :  Treated with IV amio and DCCV but did not maintain NSR.  Now on PO amio, DOAC, Coreg  and will plan DCCV as an out patient.    Acute on chronic systolic heart failure :  Mildly reduced EF.  Moderate to severe MR.  I will reassess EF after rhythm control.  For now continue meds as listed.  She seems to be euvolemic.  Continue PO diuretic.   CAD/CABG:  No evidence of active ischemia.  Continue risk reduction.    MR:  This is moderately severe likely and I will reassess in the future likely with TEE after we have controlled the rhythm.     CAD s/p CABG 2008 :  Not suspecting active ischemia.  Continue risk reduction.  Holding statin with elevated CK on admission.  I will likely refer to start PCSK9 in the future.     For questions or updates, please contact [facility] Please consult [email or web address] for contact info under Cardiology/STEMI.   Signed, Lynwood Schilling, MD  06/03/2024, 9:09 AM

## 2024-06-04 ENCOUNTER — Encounter (HOSPITAL_COMMUNITY): Payer: Self-pay | Admitting: Internal Medicine

## 2024-06-04 DIAGNOSIS — I4891 Unspecified atrial fibrillation: Secondary | ICD-10-CM | POA: Diagnosis not present

## 2024-06-04 MED ORDER — HYDROCODONE-ACETAMINOPHEN 5-325 MG PO TABS: 1.0000 | ORAL_TABLET | Freq: Four times a day (QID) | ORAL | 0 refills | Status: AC | PRN

## 2024-06-04 MED ORDER — AMIODARONE HCL 200 MG PO TABS: ORAL_TABLET | ORAL | Status: AC

## 2024-06-04 MED ORDER — FUROSEMIDE 40 MG PO TABS: 40.0000 mg | ORAL_TABLET | Freq: Every day | ORAL | Status: AC

## 2024-06-04 MED ORDER — ISOSORBIDE MONONITRATE ER 30 MG PO TB24: 30.0000 mg | ORAL_TABLET | Freq: Every day | ORAL | Status: AC

## 2024-06-04 MED ORDER — MIRTAZAPINE 15 MG PO TABS: 15.0000 mg | ORAL_TABLET | Freq: Every day | ORAL | 0 refills | Status: AC

## 2024-06-04 MED ORDER — IPRATROPIUM-ALBUTEROL 0.5-2.5 (3) MG/3ML IN SOLN: 3.0000 mL | Freq: Four times a day (QID) | RESPIRATORY_TRACT | Status: AC | PRN

## 2024-06-04 MED ORDER — CARVEDILOL 12.5 MG PO TABS: 12.5000 mg | ORAL_TABLET | Freq: Two times a day (BID) | ORAL | Status: AC

## 2024-06-04 MED ORDER — HYDRALAZINE HCL 25 MG PO TABS: 25.0000 mg | ORAL_TABLET | Freq: Three times a day (TID) | ORAL | Status: AC

## 2024-06-04 MED ORDER — DOCUSATE SODIUM 100 MG PO CAPS: 100.0000 mg | ORAL_CAPSULE | Freq: Two times a day (BID) | ORAL | Status: AC

## 2024-06-04 NOTE — TOC Transition Note (Signed)
 Transition of Care [facility]) - Discharge Note   Patient Details  Name: Kimberly [name] MRN: [id number] Date of Birth: 1956-08-10  Transition of [facility]) CM/SW Contact:  Luise JAYSON Pan, LCSWA Phone Number: 06/04/2024, 11:59 AM   Clinical Narrative:   Patient will DC to: [facility] SNF Anticipated DC date: 06/04/24  Family notified: [name] (sister) Transport by: [facility]   Per MD patient ready for DC to [facility] healthcare. RN to call report prior to discharge [phone]; room 110). RN, patient, patient's family, and facility notified of DC. Discharge Summary and FL2 sent to facility. DC packet on chart. Ambulance transport requested for patient 11:57 AM.   CSW will sign off for now as social work intervention is no longer needed. Please consult us  again if new needs arise.      Final next level of care: Skilled Nursing Facility Barriers to Discharge: Barriers Resolved   Patient Goals and CMS Choice Patient states their goals for this hospitalization and ongoing recovery are:: to go to rehab          Discharge Placement PASRR number recieved: 05/28/24            Patient chooses bed at: [facility] Patient to be transferred to facility by: [facility] Name of family member notified: hadassah (sister) Patient and family notified of of transfer: 06/04/24  Discharge Plan and Services Additional resources added to the After Visit Summary for   In-house Referral: Clinical Social Work                                   Social Drivers of Health (SDOH) Interventions SDOH Screenings   Food Insecurity: No Food Insecurity (05/28/2024)  Housing: High Risk (05/28/2024)  Transportation Needs: No Transportation Needs (05/28/2024)  Utilities: At Risk (05/28/2024)  Alcohol Screen: Low Risk  (09/21/2023)  Depression (PHQ2-9): Low Risk  (04/02/2024)  Financial Resource Strain: Low Risk  (09/21/2023)  Physical Activity: Unknown (09/21/2023)  Social Connections:  Socially Isolated (05/28/2024)  Stress: No Stress Concern Present (09/21/2023)  Tobacco Use: Medium Risk (05/29/2024)  Health Literacy: Inadequate Health Literacy (09/21/2023)     Readmission Risk Interventions     No data to display

## 2024-06-04 NOTE — Care Management Important Message (Signed)
 Important Message  Patient Details  Name: Kimberly [name] MRN: [id number] Date of Birth: [birth year]   Important Message Given:  Yes - Medicare IM     [name] 06/04/2024, 12:31 PM

## 2024-06-04 NOTE — TOC Progression Note (Addendum)
 Transition of Care [facility]) - Progression Note    Patient Details  Name: Kimberly [name] MRN: [id number] Date of Birth: March 24, 1956  Transition of Care [facility]) CM/SW Contact  Luise JAYSON Pan, [place] Phone Number: 06/04/2024, 10:20 AM  Clinical Narrative:   Insurance auth approved. Approval dates 06/03/2024-06/05/2024. Per [facility], they can accept patient today. CSW notified MD and family.   10:24 AM CSW called [name] to inform that MD stated patient is medically stable for discharge. [name] expressed concerns of patient going to another SNF and inquired if patient will have a private room, CSW stated CSW will ask about a private room but cannot hold patient in the hospital until one is available. [name] expressed her understanding. [name] stated that if patient cannot go to a private room, family would rather patient [facility], PT, and OT. [name] asked to speak with MD, CSW notified MD.   CSW inquired with [facility] about a private room, CSW awaiting response.   11:19 AM CSW met patient at bedside and patient is agreeable to going to [facility] today. Per MD, he spoke with family and family is okay with patient discharging.   CSW will continue to follow.    Expected Discharge Plan: Skilled Nursing Facility Barriers to Discharge: Continued Medical Work up, SNF Pending bed offer, Insurance Authorization               Expected Discharge Plan and Services In-house Referral: Clinical Social Work     Living arrangements for the past 2 months: Single Family Home                                       Social Drivers of Health (SDOH) Interventions SDOH Screenings   Food Insecurity: No Food Insecurity (05/28/2024)  Housing: High Risk (05/28/2024)  Transportation Needs: No Transportation Needs (05/28/2024)  Utilities: At Risk (05/28/2024)  Alcohol Screen: Low Risk  (09/21/2023)  Depression (PHQ2-9): Low Risk  (04/02/2024)  Financial Resource Strain: Low Risk  (09/21/2023)  Physical  Activity: Unknown (09/21/2023)  Social Connections: Socially Isolated (05/28/2024)  Stress: No Stress Concern Present (09/21/2023)  Tobacco Use: Medium Risk (05/29/2024)  Health Literacy: Inadequate Health Literacy (09/21/2023)    Readmission Risk Interventions     No data to display

## 2024-06-04 NOTE — Progress Notes (Signed)
 Progress Note  Patient Name: Kimberly [name] Date of Encounter: 06/04/2024  Primary Cardiologist:   Lynwood Schilling, MD   Subjective   Denies any new SOB.  She is awaiting placement in rehab.  She denies any recent chest pain.    Inpatient Medications    Scheduled Meds:  amiodarone  200 mg Oral BID   apixaban   5 mg Oral BID   aspirin  EC  81 mg Oral Daily   bisacodyl   10 mg Rectal Once   carvedilol   12.5 mg Oral BID WC   docusate sodium   100 mg Oral BID   furosemide   40 mg Oral Daily   hydrALAZINE   25 mg Oral Q8H   isosorbide  mononitrate  30 mg Oral Daily   mirtazapine   15 mg Oral QHS   pantoprazole   40 mg Oral Daily   sacubitril -valsartan   1 tablet Oral BID   Continuous Infusions:  PRN Meds: acetaminophen  **OR** acetaminophen , hydrALAZINE , HYDROcodone -acetaminophen , ipratropium-albuterol , polyethylene glycol, prochlorperazine   Vital Signs    Vitals:   06/03/24 2010 06/04/24 0030 06/04/24 0330 06/04/24 0711  BP: 115/89 (!) 82/63 (!) 129/98 (!) 130/91  Pulse: 93 92 90 90  Resp: 16 12 10 17   Temp:  97.6 F (36.4 C) (!) 97.5 F (36.4 C) 97.6 F (36.4 C)  TempSrc:  Oral Oral Oral  SpO2: 99% 95% 100% 99%  Weight:      Height:        Intake/Output Summary (Last 24 hours) at 06/04/2024 1025 Last data filed at 06/04/2024 0830 Gross per 24 hour  Intake 840 ml  Output 200 ml  Net 640 ml   Filed Weights   05/28/24 0022 05/28/24 1553  Weight: 56.7 kg 63.4 kg    Telemetry    Atrial fib with good rate control - Personally Reviewed  ECG    NA - Personally Reviewed  Physical Exam   GEN: No  acute distress.   Neck: No  JVD Cardiac: Irregular RR, no murmurs, rubs, or gallops.  Respiratory: Clear  to auscultation bilaterally. GI: Soft, nontender, non-distended, normal bowel sounds  MS:  No edema; No deformity. Neuro:   Nonfocal    Labs    Chemistry Recent Labs  Lab 05/30/24 0302 05/31/24 0243 06/02/24 1210  NA 140 143 140  K 3.5 4.5 4.2   CL 113* 114* 109  CO2 21* 16* 21*  GLUCOSE 79 79 89  BUN 19 14 14   CREATININE 1.12* 0.95 1.08*  CALCIUM  8.5* 8.5* 9.1  GFRNONAA 54* >60 56*  ANIONGAP 6 13 10      Hematology Recent Labs  Lab 05/29/24 0255 05/30/24 0302  WBC 5.9 6.3  RBC 4.03 4.33  HGB 9.2* 9.7*  HCT 29.3* 31.6*  MCV 72.7* 73.0*  MCH 22.8* 22.4*  MCHC 31.4 30.7  RDW 16.7* 17.2*  PLT 152 165    Cardiac EnzymesNo results for input(s): TROPONINI in the last 168 hours. No results for input(s): TROPIPOC in the last 168 hours.   BNP No results for input(s): BNP, PROBNP in the last 168 hours.    DDimer  No results for input(s): DDIMER in the last 168 hours.    Radiology    No results found.  Cardiac Studies   Echo:    1. Left ventricular ejection fraction, by estimation, is 45 to 50%. The  left ventricle has mildly decreased function. The left ventricle  demonstrates regional wall motion abnormalities (see scoring  diagram/findings for description). There is mild left  ventricular hypertrophy. Left ventricular diastolic parameters are  indeterminate.   2. Right ventricular systolic function is mildly reduced. The right  ventricular size is normal. There is normal pulmonary artery systolic  pressure. The estimated right ventricular systolic pressure is 26.8 mmHg.   3. Left atrial size was severely dilated.   4. The mitral valve is abnormal. Moderate to severe mitral valve  regurgitation. Visually MR appears moderate, but may be underestimating as  splay artifact is seen, though low E wave velocity argues against severe  MR   5. The aortic valve was not well visualized. Aortic valve regurgitation  is not visualized. No aortic stenosis is present.   6. Aortic dilatation noted. Aneurysm of the ascending aorta, measuring 47  mm.   7. The inferior vena cava is dilated in size with >50% respiratory  variability, suggesting right atrial pressure of 8 mmHg.   Patient Profile     68 y.o.  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    Assessment & Plan    Atrial Fibrillation :  Treated with IV amio and DCCV but did not maintain NSR.  Now on PO amio, Eliquis , Coreg .  I will arrange 4 week follow up to plan for DCCV if she is still in the hospital   Acute on chronic systolic heart failure :  She is euvolemic.  She tolerates current meds not with labile BPs would not tolerate titration.    CAD/CABG:  The trop was mildly elevated.  EF is down slightly.   However, she has had no active chest pain and no acute EKG changes this admission.  Continue DOAC and ASA given recent increased trop.   MR:   Moderately severe but I will follow as an out patient and consider further management based on repeat TTE and TEE as needed.   She has no acute SOB.    For questions or updates, please contact [facility] Please consult [email or web address] for contact info under Cardiology/STEMI.   Signed, Lynwood Schilling, MD  06/04/2024, 10:25 AM

## 2024-06-04 NOTE — Discharge Summary (Addendum)
 Physician Discharge Summary  Kimberly [name] FMW:[id number] DOB: 24-Dec-1955 DOA: 05/27/2024  PCP: Georgina Speaks, FNP  Admit date: 05/27/2024 Discharge date: 06/04/2024  Admitted from: Home Discharge disposition: SNF  Recommendations at discharge:  Per cardiology recommendation, currently on amiodarone 200 mg since 10/16 for 2 weeks to be followed by 200 mg daily, starting 10/30. CHF meds : Coreg  12.5 mg twice daily, Entresto  24-26 mg twice daily, hydralazine  25 mg 3 times daily, Imdur  30 mg daily and Lasix  40 mg daily Follow-up with cardiology as an outpatient Continue to care for pressure ulcer -stage III coccygeal ulcer  Subjective: Patient was seen and examined this morning.   Lying on the bed.  Not in distress.  No family at bedside.   Afebrile, hemodynamically stable. I called and spoke to her sister [name] on the phone.  Answered all questions to satisfaction.  She is agreeable for discharge to SNF today.   Brief narrative: Kimberly [name] is a 68 y.o. female with PMH significant for HTN, HLD, prediabetes, CAD/CABG, HOCM, A-fib on Eliquis  CVA with residual left-sided weakness, wheelchair-bound status, anemia, COPD gout, osteoporosis. 10/14, patient presented to the ED with complaint of chest/back pain, urinary frequency, shortness of breath, palpitation. Found to be in A-fib with RVR with heart rate in the 130s Heart rate transiently improved with IV metoprolol  Admitted to [facility] Cardiology was consulted  Hospital course: A-fib/A-flutter with RVR  h/o persistent A-fib Cardiology consulted.  Started on amiodarone drip 10/16, underwent successful cardioversion but maintained normal sinus rhythm only for about 4 hours and back to a flutter Per cardiology recommendation, currently on carvedilol  12.5 mg twice daily,  amiodarone 200 mg for 2 weeks to be followed by 200 mg daily. Tentative plan to repeat cardioversion as an outpatient after Amio load Continue chronic  anticoagulation with Eliquis    Chronic systolic CHF Moderate to Severe MR Apical Hypertrophic Cardiomyopathy HTN Seen by cardiology.   Echo with EF 45 to 50%. Appears euvolemic, compensated. Per cardiology, unclear cause of cardiomyopathy.  Ischemia versus tachycardia mediated.  May need ischemic evaluation versus cardiac MRI if no improvement in EF in next few weeks. Per cardio recommendation, currently on Coreg  12.5 mg twice daily, Entresto  24-26 mg twice daily, hydralazine  25 mg 3 times daily, Imdur  30 mg daily and Lasix  40 mg daily Allergic to Aldactone  Poor candidate for SGLT2i due to bed sore  Elevated troponin  H/o CAD/CABG  Troponin trended up to peak at 616, trended down  No acute ischemic EKG changes.   Suspect demand ischemia in setting of A-fib with RVR Recommended ischemia evaluation as an outpatient Continue aspirin , Eliquis .  Statin plan as below  HLD Off Lipitor  due to recent rhabdomyolysis. Cardiology plans for PCSK9 initiation as an outpatient.   AKI Acute metabolic acidosis Baseline creatinine normal.  Patient with creatinine elevated to 1.54 With IV hydration, creatinine improved down to normal.  Bicarb level improved as well in the last 3 days Recent Labs    04/28/24 1124 04/29/24 0835 04/30/24 0821 05/01/24 0649 05/27/24 2045 05/28/24 0530 05/29/24 0255 05/30/24 0302 05/31/24 0243 06/02/24 1210  BUN 17  16 16 12 10  34* 33* 28* 19 14 14   CREATININE 0.89  0.88 0.78 0.89 0.76 1.54* 1.43* 1.32* 1.12* 0.95 1.08*  CO2 24  24 22 23 27  19* 19* 19* 21* 16* 21*   Hypokalemia  Resolved with replacement Recent Labs  Lab 05/29/24 0255 05/30/24 0302 05/31/24 0243 06/02/24 1210  K 3.2* 3.5 4.5 4.2  MG 2.2 2.0  --   --    Thoracic aortic aneurysm  Thoracic aortic aneurysm size is stable at 4.4 cm.   Follow-up as an outpatient for repeat CT in a year   Abdominal aortic aneurysm  infra-renal 3.0 cm, unchanged from prior imaging U/S in 3  years  Bilateral carotid artery stenosis  Medical management as above  Hx of CVA  Has residual left-sided weakness.   No acute changes Continue Eliquis    Chronic microcytic anemia  Hemoglobin stable above 9 Continue to monitor Recent Labs    05/01/24 0649 05/27/24 2045 05/28/24 0530 05/29/24 0255 05/30/24 0302  HGB 11.0* 10.4* 9.0* 9.2* 9.7*  MCV 71.6* 72.7* 72.1* 72.7* 73.0*   Tobacco use  counseled on cessation   Pressure injury of skin -- POA Wound care RN consulted Continue wound care, off-loading pressures areas, closely monitor   Impaired mobility Likely wheelchair-bound status Seen by PT.  SNF recommended  Diet:  Diet Order             Diet - low sodium heart healthy           Diet Heart Room service appropriate? Yes; Fluid consistency: Thin  Diet effective now                   Nutritional status:  Body mass index is 21.89 kg/m.       Wounds:  - Wound 05/28/24 1714 Pressure Injury Coccyx Mid Stage 3 -  Full thickness tissue loss. Subcutaneous fat may be visible but bone, tendon or muscle are NOT exposed. (Active)  Date First Assessed/Time First Assessed: 05/28/24 1714   Present on Original Admission: Yes  Primary Wound Type: Pressure Injury  Location: Coccyx  Location Orientation: Mid  Staging: Stage 3 -  Full thickness tissue loss. Subcutaneous fat may be visi...    Assessments 05/28/2024  3:55 PM 06/04/2024  8:30 AM  Wound Image     Site / Wound Assessment Dry;Painful;Red Dry;Clean  Peri-wound Assessment Intact;Erythema (non-blanchable) Erythema (non-blanchable)  Wound Length (cm) 2.5 cm --  Wound Width (cm) 1.5 cm --  Wound Surface Area (cm^2) 2.95 cm^2 --  Drainage Amount None --  Treatment Off loading --  Dressing Type Foam - Lift dressing to assess site every shift Foam - Lift dressing to assess site every shift  Dressing Changed New --  Dressing Status Clean, Dry, Intact Clean, Dry, Intact     No associated orders.     Discharge Medications:   Allergies as of 06/04/2024       Reactions   Catapres -tts [clonidine ] Other (See Comments)   Reaction caused by clonidine  patch but not a direct result of the medication Patch adhesive caused skin tears resulting in scarring   Aldactone  [spironolactone ] Rash        Medication List     TAKE these medications    ACCRUFeR  30 MG Caps Generic drug: Ferric Maltol  Take 1 capsule (30 mg total) by mouth daily.   acetaminophen  500 MG tablet Commonly known as: TYLENOL  Take 1,000 mg by mouth in the morning and at bedtime.   albuterol  108 (90 Base) MCG/ACT inhaler Commonly known as: VENTOLIN  HFA INHALE 2 PUFFS BY MOUTH INTO THE LUNGS EVERY 6 HOURS AS NEEDED FOR WHEEZING OR SHORTNESS OF BREATH   alendronate  70 MG tablet Commonly known as: FOSAMAX  TAKE 1 TABLET BY MOUTH EVERY 7 DAYS. Take WITH A full GLASS OF WATER ON EMPTY stomach What changed: See the new instructions.  amiodarone 200 MG tablet Commonly known as: PACERONE amiodarone 200 mg since 10/16 for 2 weeks to be followed by 200 mg daily, starting 10/30.   Anoro Ellipta  62.5-25 MCG/ACT Aepb Generic drug: umeclidinium-vilanterol INHALE 1 PUFF BY MOUTH INTO THE LUNGS ONCE DAILY. RINSE MOUTH AFTER USE   aspirin  EC 81 MG tablet Take 81 mg by mouth daily.   atorvastatin  10 MG tablet Commonly known as: LIPITOR  Take 10 mg by mouth every evening.   benzonatate  100 MG capsule Commonly known as: Tessalon  Perles Take 1 capsule (100 mg total) by mouth 3 (three) times daily as needed. What changed: reasons to take this   calcium -vitamin D  500-5 MG-MCG tablet Commonly known as: OSCAL WITH D Take 1 tablet by mouth 2 (two) times daily with a meal.   carvedilol  12.5 MG tablet Commonly known as: COREG  Take 1 tablet (12.5 mg total) by mouth 2 (two) times daily with a meal. What changed:  medication strength how much to take   docusate sodium  100 MG capsule Commonly known as: COLACE Take 1  capsule (100 mg total) by mouth 2 (two) times daily. What changed: how much to take   Eliquis  5 MG Tabs tablet Generic drug: apixaban  TAKE 1 TABLET BY MOUTH 2 TIMES DAILY   fluticasone  50 MCG/ACT nasal spray Commonly known as: FLONASE  SPRAY 2 SPRAYS into EACH nostril DAILY What changed: See the new instructions.   furosemide  40 MG tablet Commonly known as: LASIX  Take 1 tablet (40 mg total) by mouth daily. Start taking on: June 05, 2024   hydrALAZINE  25 MG tablet Commonly known as: APRESOLINE  Take 1 tablet (25 mg total) by mouth every 8 (eight) hours.   HYDROcodone -acetaminophen  5-325 MG tablet Commonly known as: NORCO/VICODIN Take 1 tablet by mouth every 6 (six) hours as needed for moderate pain (pain score 4-6).   hydrocortisone cream 1 % Apply 1 application topically daily as needed for itching.   ipratropium-albuterol  0.5-2.5 (3) MG/3ML Soln Commonly known as: DUONEB Take 3 mLs by nebulization every 6 (six) hours as needed.   isosorbide  mononitrate 30 MG 24 hr tablet Commonly known as: IMDUR  Take 1 tablet (30 mg total) by mouth daily. Start taking on: June 05, 2024   mirtazapine  15 MG tablet Commonly known as: REMERON  Take 1 tablet (15 mg total) by mouth at bedtime.   pantoprazole  40 MG tablet Commonly known as: PROTONIX  TAKE 1 TABLET BY MOUTH EVERY DAY   polyethylene glycol 17 g packet Commonly known as: MIRALAX  / GLYCOLAX  Take 17 g by mouth daily. Hold for diarrhea What changed: additional instructions   sacubitril -valsartan  24-26 MG Commonly known as: ENTRESTO  Take 1 tablet by mouth 2 (two) times daily.   Vitamin D  (Ergocalciferol ) 1.25 MG (50000 UNIT) Caps capsule Commonly known as: DRISDOL  TAKE 1 CAPSULE BY MOUTH TWICE A WEEK What changed: See the new instructions.               Discharge Care Instructions  (From admission, onward)           Start     Ordered   06/04/24 0000  Discharge wound care:        06/04/24 1112              Follow ups:    Contact information for follow-up providers     Georgina Speaks, FNP Follow up.   Specialty: General Practice Contact information: [address] [place] [address] [phone]  Contact information for after-discharge care     Destination     [facility] .   Service: Skilled Nursing Contact information: [address] [place]  [address] [phone]                     Discharge Instructions:   Discharge Instructions     Call MD for:  difficulty breathing, headache or visual disturbances   Complete by: As directed    Call MD for:  extreme fatigue   Complete by: As directed    Call MD for:  hives   Complete by: As directed    Call MD for:  persistant dizziness or light-headedness   Complete by: As directed    Call MD for:  persistant nausea and vomiting   Complete by: As directed    Call MD for:  severe uncontrolled pain   Complete by: As directed    Call MD for:  temperature >100.4   Complete by: As directed    Diet - low sodium heart healthy   Complete by: As directed    Discharge instructions   Complete by: As directed    Recommendations at discharge:   Per cardiology recommendation, currently on amiodarone 200 mg since 10/16 for 2 weeks to be followed by 200 mg daily, starting 10/30.  CHF meds : Coreg  12.5 mg twice daily, Entresto  24-26 mg twice daily, hydralazine  25 mg 3 times daily, Imdur  30 mg daily and Lasix  40 mg daily  Follow-up with cardiology as an outpatient  Continue to care for pressure ulcer -stage III coccygeal ulcer  Discharge instructions for CHF Check weight daily -preferably same time every day. Restrict fluid intake to 1200 ml daily Restrict salt intake to less than 2 g daily. Call MD if you have one of the following symptoms 1) 3 pound weight gain in 24 hours or 5 pounds in 1 week  2) swelling in the hands, feet or stomach  3) progressive  shortness of breath 4) if you have to sleep on extra pillows at night in order to breathe       PDMP reviewed this encounter.   Opioid taper instructions: It is important to wean off of your opioid medication as soon as possible. If you do not need pain medication after your surgery it is ok to stop day one. Opioids include: Codeine, Hydrocodone (Norco, Vicodin), Oxycodone (Percocet, oxycontin ) and hydromorphone amongst others.  Long term and even short term use of opiods can cause: Increased pain response Dependence Constipation Depression Respiratory depression And more.  Withdrawal symptoms can include Flu like symptoms Nausea, vomiting And more Techniques to manage these symptoms Hydrate well Eat regular healthy meals Stay active Use relaxation techniques(deep breathing, meditating, yoga) Do Not substitute Alcohol to help with tapering If you have been on opioids for less than two weeks and do not have pain than it is ok to stop all together.  Plan to wean off of opioids This plan should start within one week post op of your joint replacement. Maintain the same interval or time between taking each dose and first decrease the dose.  Cut the total daily intake of opioids by one tablet each day Next start to increase the time between doses. The last dose that should be eliminated is the evening dose.        General discharge instructions: Follow with Primary MD Georgina Speaks, FNP in 7 days  Please request your PCP  to go over your hospital tests,  procedures, radiology results at the follow up. Please get your medicines reviewed and adjusted.  Your PCP may decide to repeat certain labs or tests as needed. Do not drive, operate heavy machinery, perform activities at heights, swimming or participation in water activities or provide baby sitting services if your were admitted for syncope or siezures until you have seen by Primary MD or a Neurologist and advised to do so  again. [place]  Controlled Substance Reporting System database was reviewed. Do not drive, operate heavy machinery, perform activities at heights, swim, participate in water activities or provide baby-sitting services while on medications for pain, sleep and mood until your outpatient physician has reevaluated you and advised to do so again.  You are strongly recommended to comply with the dose, frequency and duration of prescribed medications. Activity: As tolerated with Full fall precautions use walker/cane & assistance as needed Avoid using any recreational substances like cigarette, tobacco, alcohol, or non-prescribed drug. If you experience worsening of your admission symptoms, develop shortness of breath, life threatening emergency, suicidal or homicidal thoughts you must seek medical attention immediately by calling 911 or calling your MD immediately  if symptoms less severe. You must read complete instructions/literature along with all the possible adverse reactions/side effects for all the medicines you take and that have been prescribed to you. Take any new medicine only after you have completely understood and accepted all the possible adverse reactions/side effects.  Wear Seat belts while driving. You were cared for by a hospitalist during your hospital stay. If you have any questions about your discharge medications or the care you received while you were in the hospital after you are discharged, you can call the unit and ask to speak with the hospitalist or the covering physician. Once you are discharged, your primary care physician will handle any further medical issues. Please note that NO REFILLS for any discharge medications will be authorized once you are discharged, as it is imperative that you return to your primary care physician (or establish a relationship with a primary care physician if you do not have one).   Discharge wound care:   Complete by: As directed    Increase  activity slowly   Complete by: As directed        Discharge Exam:   Vitals:   06/04/24 0030 06/04/24 0330 06/04/24 0711 06/04/24 1029  BP: (!) 82/63 (!) 129/98 (!) 130/91 113/80  Pulse: 92 90 90 89  Resp: 12 10 17 17   Temp: 97.6 F (36.4 C) (!) 97.5 F (36.4 C) 97.6 F (36.4 C) 98.9 F (37.2 C)  TempSrc: Oral Oral Oral Oral  SpO2: 95% 100% 99% 94%  Weight:      Height:        Body mass index is 21.89 kg/m.  General exam: Pleasant, elderly African-American female.  Not in distress Skin: No rashes, lesions or ulcers. HEENT: Atraumatic, normocephalic, no obvious bleeding Lungs: Clear to auscultation bilaterally,  CVS: S1, S2, no murmur,   GI/Abd: Soft, nontender, nondistended, bowel sound present,   CNS: Alert, awake, oriented to place and person, baseline weakness in the left side due to previous stroke Psychiatry: Mood appropriate. Extremities: No pedal edema, no calf tenderness   The results of significant diagnostics from this hospitalization (including imaging, microbiology, ancillary and laboratory) are listed below for reference.    Procedures and Diagnostic Studies:   CT L-SPINE NO CHARGE Result Date: 05/28/2024 EXAM: CT OF THE LUMBAR SPINE WITHOUT CONTRAST 05/28/2024 01:21:01 AM TECHNIQUE:  CT of the lumbar spine was performed without the administration of intravenous contrast. Multiplanar reformatted images are provided for review. Automated exposure control, iterative reconstruction, and/or weight based adjustment of the mA/kV was utilized to reduce the radiation dose to as low as reasonably achievable. COMPARISON: None available. CLINICAL HISTORY: Pt recently discharged from nursing facility to home and c/o back pain, chest pain, and shortness of breath. Pt also reports urinary frequency. Pt has chronic back pain but states worse than usual as of this morning. FINDINGS: BONES AND ALIGNMENT: Normal vertebral body heights. No acute fracture or suspicious bone lesion.  Normal alignment. DEGENERATIVE CHANGES: No significant degenerative changes. SOFT TISSUES: Evaluated on dedicated CT abdomen/pelvis. IMPRESSION: 1. Normal CT lumbar spine. Electronically signed by: Pinkie Pebbles MD 05/28/2024 01:32 AM EDT RP Workstation: [id number]   CT Angio Chest Pulmonary Embolism (PE) W or WO Contrast Result Date: 05/28/2024 EXAM: CTA of the Chest with contrast for PE 05/28/2024 01:21:01 AM TECHNIQUE: CTA of the chest was performed with the administration of 75 mL of iohexol  (OMNIPAQUE ) 350 MG/ML injection. Multiplanar reformatted images are provided for review. MIP images are provided for review. Automated exposure control, iterative reconstruction, and/or weight based adjustment of the mA/kV was utilized to reduce the radiation dose to as low as reasonably achievable. COMPARISON: CT chest dated 04/15/2024. CLINICAL HISTORY: Pulmonary embolism (PE) suspected, high prob. 75cc [id number]; Pt bib [facility] coming from home. Pt recently discharged from nursing facility to home and c/o back pain, chest pain, and shortness of breath. Pt also reports urinary frequency. Pt has chronic back pain but states worse than usual as of this morning. FINDINGS: PULMONARY ARTERIES: Pulmonary arteries are adequately opacified for evaluation. No pulmonary embolism. Enlargement of the main pulmonary artery, suggesting pulmonary arterial hypertension. MEDIASTINUM: Cardiomegaly. Postsurgical changes related to prior CABG. 4.4 cm ascending thoracic aortic aneurysm, unchanged. Thoracic aortic atherosclerosis. LYMPH NODES: No mediastinal, hilar or axillary lymphadenopathy. LUNGS AND PLEURA: Mild centrilobular and paraseptal emphysematous changes, upper lung predominant. Compressive atelectasis in the medial left lower lobe. Mild right basilar atelectasis. No pleural effusion or pneumothorax. UPPER ABDOMEN: Limited images of the upper abdomen are unremarkable. SOFT TISSUES AND BONES: No acute bone or soft tissue  abnormality. IMPRESSION: 1. No pulmonary embolism. 2. 4.4 cm ascending thoracic aortic aneurysm, unchanged. Consider follow-up CT chest in 1 year. 3. Enlarged main pulmonary artery, suggesting pulmonary arterial hypertension. Electronically signed by: Pinkie Pebbles MD 05/28/2024 01:31 AM EDT RP Workstation: [id number]   CT ABDOMEN PELVIS W CONTRAST Result Date: 05/28/2024 EXAM: CT ABDOMEN AND PELVIS WITH CONTRAST 05/28/2024 01:21:01 AM TECHNIQUE: CT of the abdomen and pelvis was performed with the administration of 75 mL of iohexol  (OMNIPAQUE ) 350 MG/ML injection. Multiplanar reformatted images are provided for review. Automated exposure control, iterative reconstruction, and/or weight-based adjustment of the mA/kV was utilized to reduce the radiation dose to as low as reasonably achievable. COMPARISON: 04/25/2024 CLINICAL HISTORY: Abdominal pain, acute, nonlocalized. 75cc [id number]; Pt bib [facility] coming from home. Pt recently discharged from nursing facility to home and c/o back pain, chest pain, and shortness of breath. Pt also reports urinary frequency. Pt has chronic back pain but states worse than usual as of this morning. FINDINGS: LOWER CHEST: No acute abnormality. LIVER: The liver is unremarkable. GALLBLADDER AND BILE DUCTS: Gallbladder is unremarkable. No biliary ductal dilatation. SPLEEN: No acute abnormality. PANCREAS: No acute abnormality. ADRENAL GLANDS: No acute abnormality. KIDNEYS, URETERS AND BLADDER: Mild left renal cortical scarring/atrophy. No stones in the kidneys or ureters. No hydronephrosis. No  perinephric or periureteral stranding. Urinary bladder is unremarkable. GI AND BOWEL: Stomach demonstrates no acute abnormality. Normal appendix (image 56). Moderate rectal stool burden with possible mild stercoral colitis (image 70). There is no bowel obstruction. PERITONEUM AND RETROPERITONEUM: No ascites. No free air. VASCULATURE: Atherosclerotic calcifications of the abdominal aorta and branch  vessels, although patent. 3.0 cm infrarenal abdominal aortic aneurysm (image 32), unchanged. LYMPH NODES: No lymphadenopathy. REPRODUCTIVE ORGANS: Calcified uterine fibroid. BONES AND SOFT TISSUES: No acute osseous abnormality. No focal soft tissue abnormality. IMPRESSION: 1. Moderate rectal stool burden with possible mild stercoral colitis. 2. 3.0 cm infrarenal abdominal aortic aneurysm, unchanged. Consider follow-up ultrasound in 3 years. Electronically signed by: Pinkie Pebbles MD 05/28/2024 01:28 AM EDT RP Workstation: [id number]   DG Chest 2 View Result Date: 05/27/2024 CLINICAL DATA:  Chest pain, shortness of breath EXAM: CHEST - 2 VIEW COMPARISON:  04/25/2024 FINDINGS: Prior CABG. Cardiomegaly with vascular congestion. No overt edema. No confluent opacities or effusions. No acute bony abnormality. IMPRESSION: Cardiomegaly, vascular congestion. Electronically Signed   By: Franky Crease M.D.   On: 05/27/2024 21:15     Labs:   Basic Metabolic Panel: Recent Labs  Lab 05/29/24 0255 05/30/24 0302 05/31/24 0243 06/02/24 1210  NA 137 140 143 140  K 3.2* 3.5 4.5 4.2  CL 109 113* 114* 109  CO2 19* 21* 16* 21*  GLUCOSE 90 79 79 89  BUN 28* 19 14 14   CREATININE 1.32* 1.12* 0.95 1.08*  CALCIUM  8.2* 8.5* 8.5* 9.1  MG 2.2 2.0  --   --    GFR Estimated Creatinine Clearance: 49.2 mL/min (A) (by C-G formula based on SCr of 1.08 mg/dL (H)). Liver Function Tests: No results for input(s): AST, ALT, ALKPHOS, BILITOT, PROT, ALBUMIN in the last 168 hours. No results for input(s): LIPASE, AMYLASE in the last 168 hours. No results for input(s): AMMONIA in the last 168 hours. Coagulation profile No results for input(s): INR, PROTIME in the last 168 hours.  CBC: Recent Labs  Lab 05/29/24 0255 05/30/24 0302  WBC 5.9 6.3  HGB 9.2* 9.7*  HCT 29.3* 31.6*  MCV 72.7* 73.0*  PLT 152 165   Cardiac Enzymes: No results for input(s): CKTOTAL, CKMB, CKMBINDEX, TROPONINI  in the last 168 hours. BNP: Invalid input(s): POCBNP CBG: Recent Labs  Lab 05/28/24 2141 05/29/24 0647 05/29/24 1628 06/01/24 1744  GLUCAP 130* 100* 84 95   D-Dimer No results for input(s): DDIMER in the last 72 hours. Hgb A1c No results for input(s): HGBA1C in the last 72 hours. Lipid Profile No results for input(s): CHOL, HDL, LDLCALC, TRIG, CHOLHDL, LDLDIRECT in the last 72 hours. Thyroid function studies No results for input(s): TSH, T4TOTAL, T3FREE, THYROIDAB in the last 72 hours.  Invalid input(s): FREET3 Anemia work up No results for input(s): VITAMINB12, FOLATE, FERRITIN, TIBC, IRON, RETICCTPCT in the last 72 hours. Microbiology No results found for this or any previous visit (from the past 240 hours).  Time coordinating discharge: 45 minutes  Signed: Timira Bieda  [facility] 06/04/2024, 11:12 AM

## 2024-06-05 DIAGNOSIS — I5022 Chronic systolic (congestive) heart failure: Secondary | ICD-10-CM | POA: Diagnosis not present

## 2024-06-05 DIAGNOSIS — M6281 Muscle weakness (generalized): Secondary | ICD-10-CM | POA: Diagnosis not present

## 2024-06-05 DIAGNOSIS — K5900 Constipation, unspecified: Secondary | ICD-10-CM | POA: Diagnosis not present

## 2024-06-05 DIAGNOSIS — I25708 Atherosclerosis of coronary artery bypass graft(s), unspecified, with other forms of angina pectoris: Secondary | ICD-10-CM | POA: Diagnosis not present

## 2024-06-05 DIAGNOSIS — I130 Hypertensive heart and chronic kidney disease with heart failure and stage 1 through stage 4 chronic kidney disease, or unspecified chronic kidney disease: Secondary | ICD-10-CM | POA: Diagnosis not present

## 2024-06-05 DIAGNOSIS — I340 Nonrheumatic mitral (valve) insufficiency: Secondary | ICD-10-CM | POA: Diagnosis not present

## 2024-06-05 DIAGNOSIS — J449 Chronic obstructive pulmonary disease, unspecified: Secondary | ICD-10-CM | POA: Diagnosis not present

## 2024-06-05 DIAGNOSIS — I69354 Hemiplegia and hemiparesis following cerebral infarction affecting left non-dominant side: Secondary | ICD-10-CM | POA: Diagnosis not present

## 2024-06-05 DIAGNOSIS — I422 Other hypertrophic cardiomyopathy: Secondary | ICD-10-CM | POA: Diagnosis not present

## 2024-06-05 DIAGNOSIS — E559 Vitamin D deficiency, unspecified: Secondary | ICD-10-CM | POA: Diagnosis not present

## 2024-06-05 DIAGNOSIS — N1831 Chronic kidney disease, stage 3a: Secondary | ICD-10-CM | POA: Diagnosis not present

## 2024-06-06 DIAGNOSIS — Z7409 Other reduced mobility: Secondary | ICD-10-CM | POA: Diagnosis not present

## 2024-06-06 DIAGNOSIS — I69354 Hemiplegia and hemiparesis following cerebral infarction affecting left non-dominant side: Secondary | ICD-10-CM | POA: Diagnosis not present

## 2024-06-06 DIAGNOSIS — D6869 Other thrombophilia: Secondary | ICD-10-CM | POA: Diagnosis not present

## 2024-06-06 DIAGNOSIS — Z7901 Long term (current) use of anticoagulants: Secondary | ICD-10-CM | POA: Diagnosis not present

## 2024-06-06 DIAGNOSIS — M6281 Muscle weakness (generalized): Secondary | ICD-10-CM | POA: Diagnosis not present

## 2024-06-06 DIAGNOSIS — I959 Hypotension, unspecified: Secondary | ICD-10-CM | POA: Diagnosis not present

## 2024-06-06 DIAGNOSIS — I5022 Chronic systolic (congestive) heart failure: Secondary | ICD-10-CM | POA: Diagnosis not present

## 2024-06-06 DIAGNOSIS — M549 Dorsalgia, unspecified: Secondary | ICD-10-CM | POA: Diagnosis not present

## 2024-06-06 DIAGNOSIS — I480 Paroxysmal atrial fibrillation: Secondary | ICD-10-CM | POA: Diagnosis not present

## 2024-06-06 DIAGNOSIS — I4892 Unspecified atrial flutter: Secondary | ICD-10-CM | POA: Diagnosis not present

## 2024-06-07 DIAGNOSIS — Z7901 Long term (current) use of anticoagulants: Secondary | ICD-10-CM | POA: Diagnosis not present

## 2024-06-07 DIAGNOSIS — R5381 Other malaise: Secondary | ICD-10-CM | POA: Diagnosis not present

## 2024-06-07 DIAGNOSIS — I4892 Unspecified atrial flutter: Secondary | ICD-10-CM | POA: Diagnosis not present

## 2024-06-07 DIAGNOSIS — Z7409 Other reduced mobility: Secondary | ICD-10-CM | POA: Diagnosis not present

## 2024-06-07 DIAGNOSIS — I480 Paroxysmal atrial fibrillation: Secondary | ICD-10-CM | POA: Diagnosis not present

## 2024-06-07 DIAGNOSIS — M6281 Muscle weakness (generalized): Secondary | ICD-10-CM | POA: Diagnosis not present

## 2024-06-07 DIAGNOSIS — M549 Dorsalgia, unspecified: Secondary | ICD-10-CM | POA: Diagnosis not present

## 2024-06-07 DIAGNOSIS — G8929 Other chronic pain: Secondary | ICD-10-CM | POA: Diagnosis not present

## 2024-06-08 DIAGNOSIS — R5381 Other malaise: Secondary | ICD-10-CM | POA: Diagnosis not present

## 2024-06-08 DIAGNOSIS — Z7409 Other reduced mobility: Secondary | ICD-10-CM | POA: Diagnosis not present

## 2024-06-08 DIAGNOSIS — M549 Dorsalgia, unspecified: Secondary | ICD-10-CM | POA: Diagnosis not present

## 2024-06-08 DIAGNOSIS — Z8673 Personal history of transient ischemic attack (TIA), and cerebral infarction without residual deficits: Secondary | ICD-10-CM | POA: Diagnosis not present

## 2024-06-08 DIAGNOSIS — Z7901 Long term (current) use of anticoagulants: Secondary | ICD-10-CM | POA: Diagnosis not present

## 2024-06-08 DIAGNOSIS — G8929 Other chronic pain: Secondary | ICD-10-CM | POA: Diagnosis not present

## 2024-06-08 DIAGNOSIS — I480 Paroxysmal atrial fibrillation: Secondary | ICD-10-CM | POA: Diagnosis not present

## 2024-06-08 DIAGNOSIS — M6281 Muscle weakness (generalized): Secondary | ICD-10-CM | POA: Diagnosis not present

## 2024-06-09 DIAGNOSIS — M6281 Muscle weakness (generalized): Secondary | ICD-10-CM | POA: Diagnosis not present

## 2024-06-09 DIAGNOSIS — M549 Dorsalgia, unspecified: Secondary | ICD-10-CM | POA: Diagnosis not present

## 2024-06-09 DIAGNOSIS — Z8673 Personal history of transient ischemic attack (TIA), and cerebral infarction without residual deficits: Secondary | ICD-10-CM | POA: Diagnosis not present

## 2024-06-09 DIAGNOSIS — Z7409 Other reduced mobility: Secondary | ICD-10-CM | POA: Diagnosis not present

## 2024-06-09 DIAGNOSIS — G8929 Other chronic pain: Secondary | ICD-10-CM | POA: Diagnosis not present

## 2024-06-10 DIAGNOSIS — G8929 Other chronic pain: Secondary | ICD-10-CM | POA: Diagnosis not present

## 2024-06-10 DIAGNOSIS — Z8673 Personal history of transient ischemic attack (TIA), and cerebral infarction without residual deficits: Secondary | ICD-10-CM | POA: Diagnosis not present

## 2024-06-10 DIAGNOSIS — Z7409 Other reduced mobility: Secondary | ICD-10-CM | POA: Diagnosis not present

## 2024-06-10 DIAGNOSIS — E785 Hyperlipidemia, unspecified: Secondary | ICD-10-CM | POA: Diagnosis not present

## 2024-06-10 DIAGNOSIS — E440 Moderate protein-calorie malnutrition: Secondary | ICD-10-CM | POA: Diagnosis not present

## 2024-06-10 DIAGNOSIS — I4891 Unspecified atrial fibrillation: Secondary | ICD-10-CM | POA: Diagnosis not present

## 2024-06-10 DIAGNOSIS — I10 Essential (primary) hypertension: Secondary | ICD-10-CM | POA: Diagnosis not present

## 2024-06-10 DIAGNOSIS — E559 Vitamin D deficiency, unspecified: Secondary | ICD-10-CM | POA: Diagnosis not present

## 2024-06-10 DIAGNOSIS — M6281 Muscle weakness (generalized): Secondary | ICD-10-CM | POA: Diagnosis not present

## 2024-06-11 DIAGNOSIS — M549 Dorsalgia, unspecified: Secondary | ICD-10-CM | POA: Diagnosis not present

## 2024-06-11 DIAGNOSIS — R079 Chest pain, unspecified: Secondary | ICD-10-CM | POA: Diagnosis not present

## 2024-06-11 DIAGNOSIS — G8929 Other chronic pain: Secondary | ICD-10-CM | POA: Diagnosis not present

## 2024-06-11 DIAGNOSIS — I4891 Unspecified atrial fibrillation: Secondary | ICD-10-CM | POA: Diagnosis not present

## 2024-06-11 DIAGNOSIS — I69354 Hemiplegia and hemiparesis following cerebral infarction affecting left non-dominant side: Secondary | ICD-10-CM | POA: Diagnosis not present

## 2024-06-12 DIAGNOSIS — I480 Paroxysmal atrial fibrillation: Secondary | ICD-10-CM | POA: Diagnosis not present

## 2024-06-12 DIAGNOSIS — J449 Chronic obstructive pulmonary disease, unspecified: Secondary | ICD-10-CM | POA: Diagnosis not present

## 2024-06-12 DIAGNOSIS — I130 Hypertensive heart and chronic kidney disease with heart failure and stage 1 through stage 4 chronic kidney disease, or unspecified chronic kidney disease: Secondary | ICD-10-CM | POA: Diagnosis not present

## 2024-06-12 DIAGNOSIS — Z9981 Dependence on supplemental oxygen: Secondary | ICD-10-CM | POA: Diagnosis not present

## 2024-06-12 DIAGNOSIS — M6281 Muscle weakness (generalized): Secondary | ICD-10-CM | POA: Diagnosis not present

## 2024-06-12 DIAGNOSIS — I5022 Chronic systolic (congestive) heart failure: Secondary | ICD-10-CM | POA: Diagnosis not present

## 2024-06-12 DIAGNOSIS — Z7409 Other reduced mobility: Secondary | ICD-10-CM | POA: Diagnosis not present

## 2024-06-12 DIAGNOSIS — I4891 Unspecified atrial fibrillation: Secondary | ICD-10-CM | POA: Diagnosis not present

## 2024-06-13 DIAGNOSIS — M6281 Muscle weakness (generalized): Secondary | ICD-10-CM | POA: Diagnosis not present

## 2024-06-13 DIAGNOSIS — Z7409 Other reduced mobility: Secondary | ICD-10-CM | POA: Diagnosis not present

## 2024-06-13 DIAGNOSIS — I130 Hypertensive heart and chronic kidney disease with heart failure and stage 1 through stage 4 chronic kidney disease, or unspecified chronic kidney disease: Secondary | ICD-10-CM | POA: Diagnosis not present

## 2024-06-13 DIAGNOSIS — Z9981 Dependence on supplemental oxygen: Secondary | ICD-10-CM | POA: Diagnosis not present

## 2024-06-13 DIAGNOSIS — I4891 Unspecified atrial fibrillation: Secondary | ICD-10-CM | POA: Diagnosis not present

## 2024-06-13 DIAGNOSIS — J449 Chronic obstructive pulmonary disease, unspecified: Secondary | ICD-10-CM | POA: Diagnosis not present

## 2024-06-13 DIAGNOSIS — I5022 Chronic systolic (congestive) heart failure: Secondary | ICD-10-CM | POA: Diagnosis not present

## 2024-06-14 DIAGNOSIS — I5022 Chronic systolic (congestive) heart failure: Secondary | ICD-10-CM | POA: Diagnosis not present

## 2024-06-14 DIAGNOSIS — Z7409 Other reduced mobility: Secondary | ICD-10-CM | POA: Diagnosis not present

## 2024-06-14 DIAGNOSIS — J449 Chronic obstructive pulmonary disease, unspecified: Secondary | ICD-10-CM | POA: Diagnosis not present

## 2024-06-14 DIAGNOSIS — I130 Hypertensive heart and chronic kidney disease with heart failure and stage 1 through stage 4 chronic kidney disease, or unspecified chronic kidney disease: Secondary | ICD-10-CM | POA: Diagnosis not present

## 2024-06-14 DIAGNOSIS — Z9981 Dependence on supplemental oxygen: Secondary | ICD-10-CM | POA: Diagnosis not present

## 2024-06-14 DIAGNOSIS — M6281 Muscle weakness (generalized): Secondary | ICD-10-CM | POA: Diagnosis not present

## 2024-06-15 DIAGNOSIS — I130 Hypertensive heart and chronic kidney disease with heart failure and stage 1 through stage 4 chronic kidney disease, or unspecified chronic kidney disease: Secondary | ICD-10-CM | POA: Diagnosis not present

## 2024-06-15 DIAGNOSIS — J449 Chronic obstructive pulmonary disease, unspecified: Secondary | ICD-10-CM | POA: Diagnosis not present

## 2024-06-15 DIAGNOSIS — M6281 Muscle weakness (generalized): Secondary | ICD-10-CM | POA: Diagnosis not present

## 2024-06-15 DIAGNOSIS — Z7409 Other reduced mobility: Secondary | ICD-10-CM | POA: Diagnosis not present

## 2024-06-15 DIAGNOSIS — R110 Nausea: Secondary | ICD-10-CM | POA: Diagnosis not present

## 2024-06-16 DIAGNOSIS — G8929 Other chronic pain: Secondary | ICD-10-CM | POA: Diagnosis not present

## 2024-06-16 DIAGNOSIS — M6281 Muscle weakness (generalized): Secondary | ICD-10-CM | POA: Diagnosis not present

## 2024-06-16 DIAGNOSIS — M545 Low back pain, unspecified: Secondary | ICD-10-CM | POA: Diagnosis not present

## 2024-06-16 DIAGNOSIS — Z7409 Other reduced mobility: Secondary | ICD-10-CM | POA: Diagnosis not present

## 2024-06-16 DIAGNOSIS — J449 Chronic obstructive pulmonary disease, unspecified: Secondary | ICD-10-CM | POA: Diagnosis not present

## 2024-06-30 NOTE — Progress Notes (Unsigned)
 Cardiology Office Note:   Date:  07/01/2024  ID:  Kimberly [name], DOB 28-Jun-1956, MRN [id number] PCP: Georgina Speaks, FNP  [facility] Providers Cardiologist:  Lynwood Schilling, MD {  History of Present Illness:   Kimberly [name] is a 68 y.o. female who presents for follow up of CAD and CABG.  She had CVA with PCA subacute infarct in 2019.   She had SOB and I sent her for a perfusion study which demonstrated infarct but no ischemia.   She presented to the emergency department and was admitted on 08/19/2020 and was discharged on 08/22/2020.  On presentation she complained of right upper quadrant and right lower quadrant pain that was associated with intermittent periods of nausea and vomiting.  She denied diarrhea but reported constipation.  She was found to be in atrial fibrillation with RVR.  She converted to sinus rhythm 08/20/2020 and her Cardizem  GTT was discontinued and she was started on Lopressor  25 mg daily.  She was also continued on her apixaban .  Her TSH was normal and her echocardiogram 08/19/2020 showed an LVEF of 65 to 70%.   In May 2022 she was in the hospital with near syncope.  She had possible sepsis with urinary infection and dental caries as a source.  Her cultures were negative but she was treated with antibiotics.  She was very weak and had hypoxic respiratory failure due to poor mechanics and atelectasis.  She had atrial fibrillation with rapid rate.    She was most recently in the hospital in September.  She presented to the ED with chest pain and urinary frequency.  She was found to have atrial fibrillation with a rapid rate.  She was treated with anticoagulation and attempts at rate control.  She had some AKI that improved.  She subsequently underwent cardioversion.  She is now at a nursing home.  She says she is actually feeling pretty good. The patient denies any new symptoms such as chest discomfort, neck or arm discomfort. There has been no new shortness of breath, PND or  orthopnea. There have been no reported palpitations, presyncope or syncope.    She does try to use her walker.  I am not sure that she actually can use a walker.  I remember her being wheelchair-bound.    ROS: As stated in the HPI and negative for all other systems.  Studies Reviewed:    EKG:   EKG Interpretation Date/Time:  Tuesday July 01 2024 16:26:56 EST Ventricular Rate:  54 PR Interval:  150 QRS Duration:  96 QT Interval:  490 QTC Calculation: 464 R Axis:   32  Text Interpretation: Sinus bradycardia Left ventricular hypertrophy with repolarization abnormality ( Cornell product ) When compared with ECG of 29-May-2024 13:04, No significant change since last tracing Confirmed by Schilling Lynwood ([id number]) on 07/01/2024 4:57:58 PM    Risk Assessment/Calculations:    CHA2DS2-VASc Score = 7   This indicates a 11.2% annual risk of stroke. The patient's score is based upon: CHF History: 1 HTN History: 1 Diabetes History: 0 Stroke History: 2 Vascular Disease History: 1 Age Score: 1 Gender Score: 1  Physical Exam:   VS:  BP 112/61 (BP Location: Right Arm, Patient Position: Sitting, Cuff Size: Normal)   Pulse (!) 54   Ht 5' 7 (1.702 m)   Wt 130 lb (59 kg)   SpO2 96%   BMI 20.36 kg/m    Wt Readings from Last 3 Encounters:  07/01/24 130 lb (59 kg)  05/28/24 139 lb 12.4 oz (63.4 kg)  04/25/24 125 lb (56.7 kg)     GEN: Well nourished, well developed in no acute distress NECK: No JVD; No carotid bruits CARDIAC: RRR, no murmurs, rubs, gallops RESPIRATORY:  Clear to auscultation without rales, wheezing or rhonchi  ABDOMEN: Soft, non-tender, non-distended EXTREMITIES:  No edema; No deformity   ASSESSMENT AND PLAN:   CAD/CABG:    The patient has no new sypmtoms.  No further cardiovascular testing is indicated.  We will continue with aggressive risk reduction and meds as listed.      HTN: Her blood pressure is at target.  No change in therapy.  CAROTID STENOSIS:   He has  bilateral carotid artery occlusion that we are managing conservatively because of her multiple comorbid issues.   PAF:    She is maintaining sinus rhythm.  She is on amiodarone 200 mg.  She tolerates anticoagulation.  She will continue the meds as listed.  At the next visit we should check a TSH and liver enzymes.   CHRONIC SYSTOLIC HF: Her EF was 45 to 49%.  She seems to be euvolemic.  She is allergic to spironolactone .  She is not a good candidate for SGLT2 with some bedsores.  She is tolerating the meds as listed.  No change in therapy.  AKI: Creatinine had improved by the time of discharge from the hospital.  No change in therapy.  THORACIC ANEURYSM: She will continue with follow-up.  Her aorta was 44 mm.  She would be very high risk for repair in the future.  CVA: The patient has a history of CVA.     Follow up with APP in about 4 months.   Signed, Lynwood Schilling, MD

## 2024-07-01 ENCOUNTER — Ambulatory Visit: Attending: Cardiology | Admitting: Cardiology

## 2024-07-01 ENCOUNTER — Encounter: Payer: Self-pay | Admitting: Cardiology

## 2024-07-01 VITALS — BP 112/61 | HR 54 | Ht 67.0 in | Wt 130.0 lb

## 2024-07-01 DIAGNOSIS — I2510 Atherosclerotic heart disease of native coronary artery without angina pectoris: Secondary | ICD-10-CM

## 2024-07-01 DIAGNOSIS — I2583 Coronary atherosclerosis due to lipid rich plaque: Secondary | ICD-10-CM

## 2024-07-01 DIAGNOSIS — I6523 Occlusion and stenosis of bilateral carotid arteries: Secondary | ICD-10-CM

## 2024-07-01 DIAGNOSIS — I480 Paroxysmal atrial fibrillation: Secondary | ICD-10-CM

## 2024-07-01 DIAGNOSIS — I10 Essential (primary) hypertension: Secondary | ICD-10-CM

## 2024-07-01 NOTE — Patient Instructions (Addendum)
 Medication Instructions:  Stop Aspirin  Continue all other medications *If you need a refill on your cardiac medications before your next appointment, please call your pharmacy*  Lab Work: None ordered  Testing/Procedures: None ordered   Follow-Up: At [facility], you and your health needs are our priority.  As part of our continuing mission to provide you with exceptional heart care, our providers are all part of one team.  This team includes your primary Cardiologist (physician) and Advanced Practice Providers or APPs (Physician Assistants and Nurse Practitioners) who all work together to provide you with the care you need, when you need it.  Your next appointment:  4 months   Monday  10/20/24  at 10:30 am    Provider:     We recommend signing up for the patient portal called MyChart.  Sign up information is provided on this After Visit Summary.  MyChart is used to connect with patients for Virtual Visits (Telemedicine).  Patients are able to view lab/test results, encounter notes, upcoming appointments, etc.  Non-urgent messages can be sent to your provider as well.   To learn more about what you can do with MyChart, go to [email or web address].

## 2024-07-17 ENCOUNTER — Ambulatory Visit: Admitting: Podiatry

## 2024-08-06 ENCOUNTER — Ambulatory Visit: Admitting: Podiatry

## 2024-08-06 ENCOUNTER — Encounter: Payer: Self-pay | Admitting: Podiatry

## 2024-08-06 DIAGNOSIS — B351 Tinea unguium: Secondary | ICD-10-CM | POA: Diagnosis not present

## 2024-08-06 DIAGNOSIS — M79675 Pain in left toe(s): Secondary | ICD-10-CM

## 2024-08-06 DIAGNOSIS — Z8673 Personal history of transient ischemic attack (TIA), and cerebral infarction without residual deficits: Secondary | ICD-10-CM

## 2024-08-06 DIAGNOSIS — M79674 Pain in right toe(s): Secondary | ICD-10-CM | POA: Diagnosis not present

## 2024-08-06 NOTE — Progress Notes (Signed)
 This patient returns to the office for evaluation and treatment of long thick painful nails .  This patient is unable to trim his own nails since the patient cannot reach her feet.  Patient says the nails are painful walking and wearing his shoes.   She presents to the office with a caregiver. She returns for preventive foot care services.  General Appearance  Alert, conversant and in no acute stress.  Vascular  Dorsalis pedis and posterior tibial  pulses are palpable  bilaterally.  Capillary return is within normal limits  bilaterally. Temperature is within normal limits  bilaterally.  Neurologic  Senn-Weinstein monofilament wire test within normal limits  bilaterally. Muscle power within normal limits bilaterally.  Nails Thick disfigured discolored nails with subungual debris  from hallux to fifth toes bilaterally. No evidence of bacterial infection or drainage bilaterally.  Orthopedic  No limitations of motion  feet .  No crepitus or effusions noted.  No bony pathology .  Hammer toes  B/L.  Pes planus  B/L.  Skin  normotropic skin with no porokeratosis noted bilaterally.  No signs of infections or ulcers noted.     Onychomycosis  Pain in toes right foot  Pain in toes left foot  Debridement  of nails  1-5  B/L with a nail nipper.  Nails were then filed using a dremel tool with no incidents.    RTC  prn   Cordella Bold DPM

## 2024-09-12 ENCOUNTER — Encounter: Payer: Self-pay | Admitting: Nurse Practitioner

## 2024-09-15 ENCOUNTER — Encounter: Payer: Self-pay | Admitting: Nurse Practitioner

## 2024-09-16 ENCOUNTER — Ambulatory Visit: Payer: Self-pay | Admitting: Nurse Practitioner

## 2024-10-13 ENCOUNTER — Encounter: Payer: Self-pay | Admitting: Nurse Practitioner

## 2024-10-20 ENCOUNTER — Ambulatory Visit: Admitting: Emergency Medicine

## 2024-11-05 ENCOUNTER — Ambulatory Visit: Admitting: Podiatry
